# Patient Record
Sex: Female | Born: 1985 | Race: Black or African American | Hispanic: No | Marital: Single | Smoking: Current some day smoker
Health system: Southern US, Community
[De-identification: ages and names within clinical notes are randomized; demographics above are authoritative.]

## PROBLEM LIST (undated history)

## (undated) DIAGNOSIS — J45909 Unspecified asthma, uncomplicated: Secondary | ICD-10-CM

## (undated) DIAGNOSIS — C8190 Hodgkin lymphoma, unspecified, unspecified site: Secondary | ICD-10-CM

## (undated) DIAGNOSIS — J80 Acute respiratory distress syndrome: Secondary | ICD-10-CM

## (undated) DIAGNOSIS — I10 Essential (primary) hypertension: Secondary | ICD-10-CM

## (undated) NOTE — *Deleted (*Deleted)
HEMATOLOGY ONCOLOGY PROGRESS NOTE  Date of service:   10/06/20     Patient Care Team: Patient, No Pcp Per as PCP - General (General Practice)  Chief complaint: Follow-up for Hodgkin's lymphoma  Diagnosis:   Refractory Mixed cellularity Hodgkin's lymphoma IVBE with extensive lymphadenopathy including right axillary, mediastinal and upper retroperitoneal and now biopsy proven pulmonary involvement. She was noted to have significant constitutional symptoms including significant weight loss, fevers chills and some night sweats.  Current Treatment:  Pembrolizumab- 4th line. Patient declined HDT/Auto HSCT or consideration of Car-T cell therapy.  Previous treatment  5 cycles of AVD (without bleomycin due to lung involvement and DLCO of 37%, active smoker) Multiple avoidable treatment delays due to the patient's noncompliance with follow-up for avoidable reasons. She has been counseled repeatedly that this would increase the likelihood of unfavorable outcome.  2nd line therapy with Bendamustine + Brentuximab s/p 7 cycles.  3rd line therapy with ICE s/p 2 cycles   INTERVAL HISTORY: Ms Latchford is here for follow-up for her Hodgkins lymphoma for C24D1***Pembrolizumab treatment. The patient's last visit with [place] was on 06/27/2020. The pt reports that she is doing well overall.  The pt reports ***  Of note since the patient's last visit, pt has had *** completed on *** with results revealing ***.  Lab results today (10/06/20) of CBC w/diff and CMP is as follows: all values are WNL except for ***. 10/06/2020 Sed Rate at ***  On review of systems, pt reports *** and denies ***and any other symptoms.   A&P: -Discussed pt labwork today, 10/06/20; *** -***  REVIEW OF SYSTEMS:  A 10+ POINT REVIEW OF SYSTEMS WAS OBTAINED including neurology, dermatology, psychiatry, cardiac, respiratory, lymph, extremities, GI, GU, Musculoskeletal, constitutional, breasts, reproductive, HEENT.  All  pertinent positives are noted in the HPI.  All others are negative.   Past Medical History:  Diagnosis Date  . ARDS (adult respiratory distress syndrome) (HCC)   . Asthma   . Hodgkin lymphoma (HCC)   . Hypertension     . Past Surgical History:  Procedure Laterality Date  . AXILLARY LYMPH NODE BIOPSY Right 03/19/2016   Procedure: AXILLARY LYMPH NODE BIOPSY;  Surgeon: Darnell Level, MD;  Location: [facility];  Service: General;  Laterality: Right;  . IR GENERIC HISTORICAL  12/22/2016   IR FLUORO GUIDE PORT INSERTION LEFT 12/22/2016 [facility]  . IR GENERIC HISTORICAL  12/22/2016   IR US GUIDE VASC ACCESS LEFT 12/22/2016 [facility]  . IR GENERIC HISTORICAL  12/22/2016   IR CV LINE INJECTION 12/22/2016 [facility]  . IR GENERIC HISTORICAL  12/22/2016   IR REMOVAL TUN ACCESS W/ PORT W/O FL MOD SED 12/22/2016 [facility]  . VIDEO BRONCHOSCOPY Bilateral 11/26/2016   Procedure: VIDEO BRONCHOSCOPY WITH FLUORO;  Surgeon: Oretha Milch, MD;  Location: [facility];  Service: Cardiopulmonary;  Laterality: Bilateral;    . Social History   Tobacco Use  . Smoking status: Former Smoker    Packs/day: 0.50    Years: 15.00    Pack years: 7.50    Types: Cigarettes    Quit date: 03/27/2016    Years since quitting: 4.5  . Smokeless tobacco: Never Used  Vaping Use  . Vaping Use: Never used  Substance Use Topics  . Alcohol use: Yes    Comment: occasional now  . Drug use: Not on file    ALLERGIES:  has No Known Allergies.  MEDICATIONS:  Current Outpatient Medications  Medication Sig Dispense Refill  .  acyclovir (ZOVIRAX) 400 MG tablet Take 1 tablet (400 mg total) by mouth 2 (two) times daily. Start after completion of Valtrex (Patient not taking: Reported on 01/11/2020) 60 tablet 6  . amitriptyline (ELAVIL) 10 MG tablet Take 1 tablet (10 mg total) by mouth at bedtime. (Patient not taking: Reported on 01/11/2020) 20 tablet 0  . amoxicillin-clavulanate (AUGMENTIN) 875-125 MG tablet Take 1  tablet by mouth every 12 (twelve) hours. (Patient not taking: Reported on 01/11/2020) 14 tablet 0  . gabapentin (NEURONTIN) 300 MG capsule Take 1 capsule (300 mg total) by mouth 2 (two) times daily. (Patient not taking: Reported on 01/11/2020) 60 capsule 0  . oxyCODONE-acetaminophen (PERCOCET/ROXICET) 5-325 MG tablet Take 1-2 tablets by mouth every 6 (six) hours as needed for severe pain. (Patient not taking: Reported on 01/11/2020) 50 tablet 0   No current facility-administered medications for this visit.   Facility-Administered Medications Ordered in Other Visits  Medication Dose Route Frequency Provider Last Rate Last Admin  . sodium chloride flush (NS) 0.9 % injection 10 mL  10 mL Intracatheter PRN [name], MD        PHYSICAL EXAMINATION: ECOG FS:1 - Symptomatic but completely ambulatory  There were no vitals filed for this visit. Wt Readings from Last 3 Encounters:  07/21/20 144 lb (65.3 kg)  06/27/20 144 lb 9.6 oz (65.6 kg)  04/25/20 150 lb 3.2 oz (68.1 kg)   There is no height or weight on file to calculate BMI.    *** GENERAL:alert, in no acute distress and comfortable SKIN: no acute rashes, no significant lesions EYES: conjunctiva are pink and non-injected, sclera anicteric OROPHARYNX: MMM, no exudates, no oropharyngeal erythema or ulceration NECK: supple, no JVD LYMPH:  no palpable lymphadenopathy in the cervical, axillary or inguinal regions LUNGS: clear to auscultation b/l with normal respiratory effort HEART: regular rate & rhythm ABDOMEN:  normoactive bowel sounds , non tender, not distended. No palpable hepatosplenomegaly.  Extremity: no pedal edema PSYCH: alert & oriented x 3 with fluent speech NEURO: no focal motor/sensory deficits  LABORATORY DATA:   I have reviewed the data as listed  . CBC Latest Ref Rng & Units 08/22/2020 07/21/2020 06/27/2020  WBC 4.0 - 10.5 K/uL 3.7(L) 2.6(L) 3.4(L)  Hemoglobin 12.0 - 15.0 g/dL 16.1 11.4(L) 11.9(L)  Hematocrit  36 - 46 % 35.9(L) 33.9(L) 35.0(L)  Platelets 150 - 400 K/uL 184 145(L) 177   . CBC    Component Value Date/Time   WBC 3.7 (L) 08/22/2020 0825   RBC 3.62 (L) 08/22/2020 0825   HGB 12.1 08/22/2020 0825   HGB 10.6 (L) 11/07/2018 1047   HGB 11.7 07/20/2017 1315   HCT 35.9 (L) 08/22/2020 0825   HCT 35.2 07/20/2017 1315   PLT 184 08/22/2020 0825   PLT 473 (H) 11/07/2018 1047   PLT 266 07/20/2017 1315   MCV 99.2 08/22/2020 0825   MCV 93.2 07/20/2017 1315   MCH 33.4 08/22/2020 0825   MCHC 33.7 08/22/2020 0825   RDW 12.1 08/22/2020 0825   RDW 14.5 07/20/2017 1315   LYMPHSABS 0.5 (L) 08/22/2020 0825   LYMPHSABS 0.3 (L) 07/20/2017 1315   MONOABS 0.7 08/22/2020 0825   MONOABS 0.5 07/20/2017 1315   EOSABS 0.1 08/22/2020 0825   EOSABS 0.2 07/20/2017 1315   BASOSABS 0.0 08/22/2020 0825   BASOSABS 0.0 07/20/2017 1315   . CMP Latest Ref Rng & Units 08/22/2020 07/21/2020 06/27/2020  Glucose 70 - 99 mg/dL 09(U) 83 045(W)  BUN 6 - 20 mg/dL 10  8 8  Creatinine 0.44 - 1.00 mg/dL 7.82 9.56 2.13  Sodium 135 - 145 mmol/L 137 140 138  Potassium 3.5 - 5.1 mmol/L 4.2 3.8 3.8  Chloride 98 - 111 mmol/L 101 101 100  CO2 22 - 32 mmol/L 27 26 24   Calcium 8.9 - 10.3 mg/dL 08.6 9.6 9.2  Total Protein 6.5 - 8.1 g/dL 7.3 7.0 7.4  Total Bilirubin 0.3 - 1.2 mg/dL 0.8 0.5 0.6  Alkaline Phos 38 - 126 U/L 56 63 70  AST 15 - 41 U/L 32 99(H) 72(H)  ALT 0 - 44 U/L 26 83(H) 57(H)     RADIOGRAPHIC STUDIES: I have personally reviewed the radiological images as listed and agreed with the findings in the report. No results found.  ASSESSMENT & PLAN:   33 y.o. 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 She was noted to have significant constitutional symptoms including significant weight loss, fevers chills and some night  sweats. HIV negative Hepatitis C and hepatitis B serologies negative. Echo with normal ejection fraction. Patient was treated with 5 cycles of AVD (Bleomycin held due to poor DLCO 37% and ongoing smoking). Multiple avoidable treatment delays due to the patient's noncompliance with follow-up for avoidable reasons. She has been counseled repeatedly that this would increase the likelihood of unfavorable outcome.  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 S/p 6 cycles of 2nd line treatment with Bendamustine/Brentuximab And 1 cycle of Bretuximab alone  -PET/CT scan results from 04/14/2017 were discussed in details. She appears to have some persistent disease in her right lung at Deauville 5. It is difficult to say if this is recurrent or persistent disease since the patient failed to follow-up on multiple scheduled PET/CT scans in the early part and prior to her second line treatment.  Patient was lost to followup for >1 yr  09/26/18 PET/CT revealed Imaging findings compatible with recurrence of disease. 2. Multiple new large areas of hypermetabolic nodularity and airspace consolidation within both lungs which is presumed to represent pulmonary involvement by lymphoma. Deauville criteria 5. 3. New hypermetabolic left supraclavicular, left retroperitoneal, and bilateral pelvic lymph nodes. Deauville criteria 5. 4. Multifocal hypermetabolic osseous lesions. Deauville criteria 5. 5. Small volume of ascites, new.   12/06/18 PET/CT revealed Generally improved appearance with previously mostly Deauville 5 activity now mostly Deauville 4 activity. 2. Layering of much of the airspace opacity in the lungs, although considerable right perihilar airspace opacity remains. The remaining pulmonary opacities are assess this Deauville 5 on the right and over L4 on the left, and were previously Deauville 5 bilaterally. 3. Mildly reduced size and moderately reduced activity in the  abdominopelvic lymph nodes which are now dove L4. 4. The previously seen left supraclavicular lymph node seems to have completely resolved (Deauville 0). 5. The skeletal lesions remain at Deauville 4, although in absolute terms have decreased in SUV compared to previous. 6. No new regions of malignant involvement compared to prior exam. 7. Other imaging findings of potential clinical significance: Low-density blood pool suggests anemia. Pectus excavatum. Volume loss in the right hemithorax.  06/13/19 PET/CT revealed "No abnormal hypermetabolism (Deauville 1). 2. Mixed lytic and sclerotic osseous lesions appear more prominent than on 12/06/2018 but do not have associated hypermetabolism, indicative of interval healing." 01/31/2020 CT ABDOMEN PELVIS W CONTRAST (Accession [id number]) and CT CHEST W CONTRAST (Accession [id number]) revealed "1. Small  pre pericardial lymph node, not present on the prior study. 2. Stable volume loss in the right chest with shift of mediastinal structures into the right chest. 3. No change in the appearance of multifocal bony sclerosis, without signs of FDG uptake on the previous exam, attention on follow-up."  #2 s/p hypoxic respiratory failurewith dense right lung consolidation and left upper lobe consolidation with SVC syndrome. Patient has completed palliative radiation to the right lung mass causing SVC compression.  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  #4 Non compliancewith clinic and treatment followup. Missed 2nd dose of bendamustine with C2. Has missed multiple appointment for her PET/CT and missed appointment at [facility] for consideration of Transplant.  Pt was lost to follow up after July 2018 and returned on 08/31/18 Discussed the patient's goals of care and the pt noted that she is  ready to begin treatment again and maintain compliance and follow ups   #5 h/o Grade 1 neuropathyfrom Brentuximab-Vedotin - resolved  #6 Shingles outbreak - curentlyresolved but with significant post herpetic neuralgia  #7 Significant anemia and thrombocytopenia after C1 of ICE -- will need close monitoring.  #8 Abnormal LFTs ? Related to Pembrolizumab - stable today  PLAN: *** -Will continue to get scans every 4-6 months unless new symptoms arise  -No overt lab, clinical or radiographic evidence of CHL progression at this time.*** -Advised on absolute alcohol cessation    FOLLOW UP: ***   The total time spent in the appt was *** minutes and more than 50% was on counseling and direct patient cares.  All of the patient's questions were answered with apparent satisfaction. The patient knows to call the clinic with any problems, questions or concerns.   Wyvonnia Lora MD MS AAHIVMS [facility] Hematology/Oncology Physician [facility]  (Office):       [phone] (Work cell):  [phone] (Fax):           [phone]  I, Carollee Herter, am acting as a scribe for Dr. Wyvonnia Lora.   {Add Production assistant, radio Statement}

## (undated) NOTE — *Deleted (*Deleted)
HEMATOLOGY ONCOLOGY PROGRESS NOTE  Date of service:   10/31/20     Patient Care Team: Patient, No Pcp Per as PCP - General (General Practice)  Chief complaint: Follow-up for Hodgkin's lymphoma  Diagnosis:   Refractory Mixed cellularity Hodgkin's lymphoma IVBE with extensive lymphadenopathy including right axillary, mediastinal and upper retroperitoneal and now biopsy proven pulmonary involvement. She was noted to have significant constitutional symptoms including significant weight loss, fevers chills and some night sweats.  Current Treatment:  Pembrolizumab- 4th line. Patient declined HDT/Auto HSCT or consideration of Car-T cell therapy.  Previous treatment  5 cycles of AVD (without bleomycin due to lung involvement and DLCO of 37%, active smoker) Multiple avoidable treatment delays due to the patient's noncompliance with follow-up for avoidable reasons. She has been counseled repeatedly that this would increase the likelihood of unfavorable outcome.  2nd line therapy with Bendamustine + Brentuximab s/p 7 cycles.  3rd line therapy with ICE s/p 2 cycles   INTERVAL HISTORY: [name] is here for follow-up for her Hodgkins lymphoma for C24D1 Pembrolizumab treatment. The patient's last visit with [place] was on 06/27/2020. The pt reports that she is doing well overall.  The pt reports ***  Of note since the patient's last visit, pt has had *** completed on *** with results revealing ***.  Lab results today (10/31/20) of CBC w/diff and CMP is as follows: all values are WNL except for ***. 10/31/2020 Sed Rate at ***  On review of systems, pt reports *** and denies ***and any other symptoms.   A&P: -Discussed pt labwork today, 10/31/20; *** -***  REVIEW OF SYSTEMS:  A 10+ POINT REVIEW OF SYSTEMS WAS OBTAINED including neurology, dermatology, psychiatry, cardiac, respiratory, lymph, extremities, GI, GU, Musculoskeletal, constitutional, breasts, reproductive, HEENT.  All  pertinent positives are noted in the HPI.  All others are negative.   Past Medical History:  Diagnosis Date  . ARDS (adult respiratory distress syndrome) (HCC)   . Asthma   . Hodgkin lymphoma (HCC)   . Hypertension     . Past Surgical History:  Procedure Laterality Date  . AXILLARY LYMPH NODE BIOPSY Right 03/19/2016   Procedure: AXILLARY LYMPH NODE BIOPSY;  Surgeon: Darnell Level, MD;  Location: [facility];  Service: General;  Laterality: Right;  . IR GENERIC HISTORICAL  12/22/2016   IR FLUORO GUIDE PORT INSERTION LEFT 12/22/2016 [facility]  . IR GENERIC HISTORICAL  12/22/2016   IR US GUIDE VASC ACCESS LEFT 12/22/2016 [facility]  . IR GENERIC HISTORICAL  12/22/2016   IR CV LINE INJECTION 12/22/2016 [facility]  . IR GENERIC HISTORICAL  12/22/2016   IR REMOVAL TUN ACCESS W/ PORT W/O FL MOD SED 12/22/2016 [facility]  . VIDEO BRONCHOSCOPY Bilateral 11/26/2016   Procedure: VIDEO BRONCHOSCOPY WITH FLUORO;  Surgeon: Oretha Milch, MD;  Location: [facility];  Service: Cardiopulmonary;  Laterality: Bilateral;    . Social History   Tobacco Use  . Smoking status: Former Smoker    Packs/day: 0.50    Years: 15.00    Pack years: 7.50    Types: Cigarettes    Quit date: 03/27/2016    Years since quitting: 4.6  . Smokeless tobacco: Never Used  Vaping Use  . Vaping Use: Never used  Substance Use Topics  . Alcohol use: Yes    Comment: occasional now  . Drug use: Not on file    ALLERGIES:  has No Known Allergies.  MEDICATIONS:  Current Outpatient Medications  Medication Sig Dispense Refill  .  acyclovir (ZOVIRAX) 400 MG tablet Take 1 tablet (400 mg total) by mouth 2 (two) times daily. Start after completion of Valtrex (Patient not taking: Reported on 01/11/2020) 60 tablet 6  . amitriptyline (ELAVIL) 10 MG tablet Take 1 tablet (10 mg total) by mouth at bedtime. (Patient not taking: Reported on 01/11/2020) 20 tablet 0  . amoxicillin-clavulanate (AUGMENTIN) 875-125 MG tablet Take 1  tablet by mouth every 12 (twelve) hours. (Patient not taking: Reported on 01/11/2020) 14 tablet 0  . gabapentin (NEURONTIN) 300 MG capsule Take 1 capsule (300 mg total) by mouth 2 (two) times daily. (Patient not taking: Reported on 01/11/2020) 60 capsule 0  . oxyCODONE-acetaminophen (PERCOCET/ROXICET) 5-325 MG tablet Take 1-2 tablets by mouth every 6 (six) hours as needed for severe pain. (Patient not taking: Reported on 01/11/2020) 50 tablet 0   No current facility-administered medications for this visit.   Facility-Administered Medications Ordered in Other Visits  Medication Dose Route Frequency Provider Last Rate Last Admin  . sodium chloride flush (NS) 0.9 % injection 10 mL  10 mL Intracatheter PRN [name], MD        PHYSICAL EXAMINATION: ECOG FS:1 - Symptomatic but completely ambulatory  There were no vitals filed for this visit. Wt Readings from Last 3 Encounters:  07/21/20 144 lb (65.3 kg)  06/27/20 144 lb 9.6 oz (65.6 kg)  04/25/20 150 lb 3.2 oz (68.1 kg)   There is no height or weight on file to calculate BMI.    *** GENERAL:alert, in no acute distress and comfortable SKIN: no acute rashes, no significant lesions EYES: conjunctiva are pink and non-injected, sclera anicteric OROPHARYNX: MMM, no exudates, no oropharyngeal erythema or ulceration NECK: supple, no JVD LYMPH:  no palpable lymphadenopathy in the cervical, axillary or inguinal regions LUNGS: clear to auscultation b/l with normal respiratory effort HEART: regular rate & rhythm ABDOMEN:  normoactive bowel sounds , non tender, not distended. No palpable hepatosplenomegaly.  Extremity: no pedal edema PSYCH: alert & oriented x 3 with fluent speech NEURO: no focal motor/sensory deficits  LABORATORY DATA:   I have reviewed the data as listed  . CBC Latest Ref Rng & Units 08/22/2020 07/21/2020 06/27/2020  WBC 4.0 - 10.5 K/uL 3.7(L) 2.6(L) 3.4(L)  Hemoglobin 12.0 - 15.0 g/dL 16.1 11.4(L) 11.9(L)  Hematocrit  36 - 46 % 35.9(L) 33.9(L) 35.0(L)  Platelets 150 - 400 K/uL 184 145(L) 177   . CBC    Component Value Date/Time   WBC 3.7 (L) 08/22/2020 0825   RBC 3.62 (L) 08/22/2020 0825   HGB 12.1 08/22/2020 0825   HGB 10.6 (L) 11/07/2018 1047   HGB 11.7 07/20/2017 1315   HCT 35.9 (L) 08/22/2020 0825   HCT 35.2 07/20/2017 1315   PLT 184 08/22/2020 0825   PLT 473 (H) 11/07/2018 1047   PLT 266 07/20/2017 1315   MCV 99.2 08/22/2020 0825   MCV 93.2 07/20/2017 1315   MCH 33.4 08/22/2020 0825   MCHC 33.7 08/22/2020 0825   RDW 12.1 08/22/2020 0825   RDW 14.5 07/20/2017 1315   LYMPHSABS 0.5 (L) 08/22/2020 0825   LYMPHSABS 0.3 (L) 07/20/2017 1315   MONOABS 0.7 08/22/2020 0825   MONOABS 0.5 07/20/2017 1315   EOSABS 0.1 08/22/2020 0825   EOSABS 0.2 07/20/2017 1315   BASOSABS 0.0 08/22/2020 0825   BASOSABS 0.0 07/20/2017 1315   . CMP Latest Ref Rng & Units 08/22/2020 07/21/2020 06/27/2020  Glucose 70 - 99 mg/dL 09(U) 83 045(W)  BUN 6 - 20 mg/dL 10  8 8  Creatinine 0.44 - 1.00 mg/dL 1.61 0.96 0.45  Sodium 135 - 145 mmol/L 137 140 138  Potassium 3.5 - 5.1 mmol/L 4.2 3.8 3.8  Chloride 98 - 111 mmol/L 101 101 100  CO2 22 - 32 mmol/L 27 26 24   Calcium 8.9 - 10.3 mg/dL 40.9 9.6 9.2  Total Protein 6.5 - 8.1 g/dL 7.3 7.0 7.4  Total Bilirubin 0.3 - 1.2 mg/dL 0.8 0.5 0.6  Alkaline Phos 38 - 126 U/L 56 63 70  AST 15 - 41 U/L 32 99(H) 72(H)  ALT 0 - 44 U/L 26 83(H) 57(H)     RADIOGRAPHIC STUDIES: I have personally reviewed the radiological images as listed and agreed with the findings in the report. No results found.  ASSESSMENT & PLAN:   69 y.o. 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 She was noted to have significant constitutional symptoms including significant weight loss, fevers chills and some night  sweats. HIV negative Hepatitis C and hepatitis B serologies negative. Echo with normal ejection fraction. Patient was treated with 5 cycles of AVD (Bleomycin held due to poor DLCO 37% and ongoing smoking). Multiple avoidable treatment delays due to the patient's noncompliance with follow-up for avoidable reasons. She has been counseled repeatedly that this would increase the likelihood of unfavorable outcome.  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 S/p 6 cycles of 2nd line treatment with Bendamustine/Brentuximab And 1 cycle of Bretuximab alone  -PET/CT scan results from 04/14/2017 were discussed in details. She appears to have some persistent disease in her right lung at Deauville 5. It is difficult to say if this is recurrent or persistent disease since the patient failed to follow-up on multiple scheduled PET/CT scans in the early part and prior to her second line treatment.  Patient was lost to followup for >1 yr  09/26/18 PET/CT revealed Imaging findings compatible with recurrence of disease. 2. Multiple new large areas of hypermetabolic nodularity and airspace consolidation within both lungs which is presumed to represent pulmonary involvement by lymphoma. Deauville criteria 5. 3. New hypermetabolic left supraclavicular, left retroperitoneal, and bilateral pelvic lymph nodes. Deauville criteria 5. 4. Multifocal hypermetabolic osseous lesions. Deauville criteria 5. 5. Small volume of ascites, new.   12/06/18 PET/CT revealed Generally improved appearance with previously mostly Deauville 5 activity now mostly Deauville 4 activity. 2. Layering of much of the airspace opacity in the lungs, although considerable right perihilar airspace opacity remains. The remaining pulmonary opacities are assess this Deauville 5 on the right and over L4 on the left, and were previously Deauville 5 bilaterally. 3. Mildly reduced size and moderately reduced activity in the  abdominopelvic lymph nodes which are now dove L4. 4. The previously seen left supraclavicular lymph node seems to have completely resolved (Deauville 0). 5. The skeletal lesions remain at Deauville 4, although in absolute terms have decreased in SUV compared to previous. 6. No new regions of malignant involvement compared to prior exam. 7. Other imaging findings of potential clinical significance: Low-density blood pool suggests anemia. Pectus excavatum. Volume loss in the right hemithorax.  06/13/19 PET/CT revealed "No abnormal hypermetabolism (Deauville 1). 2. Mixed lytic and sclerotic osseous lesions appear more prominent than on 12/06/2018 but do not have associated hypermetabolism, indicative of interval healing." 01/31/2020 CT ABDOMEN PELVIS W CONTRAST (Accession [id number]) and CT CHEST W CONTRAST (Accession [id number]) revealed "1. Small  pre pericardial lymph node, not present on the prior study. 2. Stable volume loss in the right chest with shift of mediastinal structures into the right chest. 3. No change in the appearance of multifocal bony sclerosis, without signs of FDG uptake on the previous exam, attention on follow-up."  #2 s/p hypoxic respiratory failurewith dense right lung consolidation and left upper lobe consolidation with SVC syndrome. Patient has completed palliative radiation to the right lung mass causing SVC compression.  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  #4 Non compliancewith clinic and treatment followup. Missed 2nd dose of bendamustine with C2. Has missed multiple appointment for her PET/CT and missed appointment at [facility] for consideration of Transplant.  Pt was lost to follow up after July 2018 and returned on 08/31/18 Discussed the patient's goals of care and the pt noted that she is  ready to begin treatment again and maintain compliance and follow ups   #5 h/o Grade 1 neuropathyfrom Brentuximab-Vedotin - resolved  #6 Shingles outbreak - curentlyresolved but with significant post herpetic neuralgia  #7 Significant anemia and thrombocytopenia after C1 of ICE -- will need close monitoring.  #8 Abnormal LFTs ? Related to Pembrolizumab - stable today  PLAN: *** -No overt lab, clinical or radiographic evidence of CHL progression at this time.*** -Advised on absolute alcohol cessation   FOLLOW UP: ***   The total time spent in the appt was *** minutes and more than 50% was on counseling and direct patient cares.  All of the patient's questions were answered with apparent satisfaction. The patient knows to call the clinic with any problems, questions or concerns.   Wyvonnia Lora MD MS AAHIVMS [facility] Hematology/Oncology Physician [facility]  (Office):       [phone] (Work cell):  [phone] (Fax):           [phone]  I, Carollee Herter, am acting as a scribe for Dr. Wyvonnia Lora.   {Add Production assistant, radio Statement}

---
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
## 2004-02-06 ENCOUNTER — Emergency Department (HOSPITAL_COMMUNITY): Admission: EM | Admit: 2004-02-06 | Discharge: 2004-02-06 | Payer: Self-pay | Admitting: Emergency Medicine

## 2004-04-30 ENCOUNTER — Ambulatory Visit (HOSPITAL_COMMUNITY): Admission: RE | Admit: 2004-04-30 | Discharge: 2004-04-30 | Payer: Self-pay | Admitting: *Deleted

## 2004-05-14 ENCOUNTER — Encounter: Admission: RE | Admit: 2004-05-14 | Discharge: 2004-05-14 | Payer: Self-pay | Admitting: Family Medicine

## 2004-06-04 ENCOUNTER — Encounter: Admission: RE | Admit: 2004-06-04 | Discharge: 2004-06-04 | Payer: Self-pay | Admitting: Family Medicine

## 2004-06-18 ENCOUNTER — Encounter: Admission: RE | Admit: 2004-06-18 | Discharge: 2004-06-18 | Payer: Self-pay | Admitting: *Deleted

## 2004-07-09 ENCOUNTER — Encounter: Admission: RE | Admit: 2004-07-09 | Discharge: 2004-07-09 | Payer: Self-pay | Admitting: Family Medicine

## 2004-07-23 ENCOUNTER — Encounter: Admission: RE | Admit: 2004-07-23 | Discharge: 2004-07-23 | Payer: Self-pay | Admitting: Family Medicine

## 2004-07-24 ENCOUNTER — Encounter: Admission: RE | Admit: 2004-07-24 | Discharge: 2004-07-24 | Payer: Self-pay | Admitting: Family Medicine

## 2004-07-28 ENCOUNTER — Ambulatory Visit (HOSPITAL_COMMUNITY): Admission: RE | Admit: 2004-07-28 | Discharge: 2004-07-28 | Payer: Self-pay | Admitting: *Deleted

## 2004-07-30 ENCOUNTER — Encounter: Admission: RE | Admit: 2004-07-30 | Discharge: 2004-07-30 | Payer: Self-pay | Admitting: Family Medicine

## 2004-08-14 ENCOUNTER — Encounter: Admission: RE | Admit: 2004-08-14 | Discharge: 2004-08-14 | Payer: Self-pay | Admitting: Family Medicine

## 2004-08-28 ENCOUNTER — Ambulatory Visit: Payer: Self-pay | Admitting: Family Medicine

## 2004-09-03 ENCOUNTER — Ambulatory Visit: Payer: Self-pay | Admitting: Family Medicine

## 2004-09-10 ENCOUNTER — Ambulatory Visit: Payer: Self-pay | Admitting: Family Medicine

## 2004-09-10 ENCOUNTER — Ambulatory Visit (HOSPITAL_COMMUNITY): Admission: RE | Admit: 2004-09-10 | Discharge: 2004-09-10 | Payer: Self-pay | Admitting: *Deleted

## 2004-09-17 ENCOUNTER — Ambulatory Visit: Payer: Self-pay | Admitting: Family Medicine

## 2004-09-24 ENCOUNTER — Ambulatory Visit: Payer: Self-pay | Admitting: Family Medicine

## 2004-10-01 ENCOUNTER — Ambulatory Visit: Payer: Self-pay | Admitting: Family Medicine

## 2004-10-08 ENCOUNTER — Ambulatory Visit: Payer: Self-pay | Admitting: Family Medicine

## 2004-10-11 ENCOUNTER — Ambulatory Visit: Payer: Self-pay | Admitting: Obstetrics & Gynecology

## 2004-10-11 ENCOUNTER — Inpatient Hospital Stay (HOSPITAL_COMMUNITY): Admission: RE | Admit: 2004-10-11 | Discharge: 2004-10-14 | Payer: Self-pay | Admitting: Family Medicine

## 2006-07-11 ENCOUNTER — Emergency Department (HOSPITAL_COMMUNITY): Admission: EM | Admit: 2006-07-11 | Discharge: 2006-07-11 | Payer: Self-pay | Admitting: Emergency Medicine

## 2007-04-02 ENCOUNTER — Emergency Department (HOSPITAL_COMMUNITY): Admission: EM | Admit: 2007-04-02 | Discharge: 2007-04-02 | Payer: Self-pay | Admitting: Emergency Medicine

## 2007-05-30 ENCOUNTER — Inpatient Hospital Stay (HOSPITAL_COMMUNITY): Admission: AD | Admit: 2007-05-30 | Discharge: 2007-05-30 | Payer: Self-pay | Admitting: Obstetrics & Gynecology

## 2007-07-05 ENCOUNTER — Ambulatory Visit (HOSPITAL_COMMUNITY): Admission: RE | Admit: 2007-07-05 | Discharge: 2007-07-05 | Payer: Self-pay | Admitting: Family Medicine

## 2007-07-20 ENCOUNTER — Ambulatory Visit: Payer: Self-pay | Admitting: Family Medicine

## 2007-07-27 ENCOUNTER — Emergency Department (HOSPITAL_COMMUNITY): Admission: EM | Admit: 2007-07-27 | Discharge: 2007-07-27 | Payer: Self-pay | Admitting: Emergency Medicine

## 2007-08-03 ENCOUNTER — Ambulatory Visit: Payer: Self-pay | Admitting: Family Medicine

## 2007-08-07 ENCOUNTER — Ambulatory Visit (HOSPITAL_COMMUNITY): Admission: RE | Admit: 2007-08-07 | Discharge: 2007-08-07 | Payer: Self-pay | Admitting: Family Medicine

## 2007-08-24 ENCOUNTER — Ambulatory Visit: Payer: Self-pay | Admitting: *Deleted

## 2007-09-07 ENCOUNTER — Ambulatory Visit: Payer: Self-pay | Admitting: Obstetrics & Gynecology

## 2007-09-21 ENCOUNTER — Ambulatory Visit: Payer: Self-pay | Admitting: Family Medicine

## 2007-10-05 ENCOUNTER — Ambulatory Visit: Payer: Self-pay | Admitting: *Deleted

## 2007-10-19 ENCOUNTER — Ambulatory Visit: Payer: Self-pay | Admitting: Obstetrics & Gynecology

## 2007-11-02 ENCOUNTER — Ambulatory Visit: Payer: Self-pay | Admitting: Obstetrics & Gynecology

## 2007-11-09 ENCOUNTER — Ambulatory Visit: Payer: Self-pay | Admitting: *Deleted

## 2007-11-16 ENCOUNTER — Ambulatory Visit: Payer: Self-pay | Admitting: Obstetrics & Gynecology

## 2007-11-20 ENCOUNTER — Ambulatory Visit: Payer: Self-pay | Admitting: Obstetrics & Gynecology

## 2007-11-27 ENCOUNTER — Ambulatory Visit: Payer: Self-pay | Admitting: Obstetrics and Gynecology

## 2007-11-29 ENCOUNTER — Inpatient Hospital Stay (HOSPITAL_COMMUNITY): Admission: AD | Admit: 2007-11-29 | Discharge: 2007-12-01 | Payer: Self-pay | Admitting: Obstetrics & Gynecology

## 2007-11-29 ENCOUNTER — Ambulatory Visit: Payer: Self-pay | Admitting: Obstetrics and Gynecology

## 2009-08-01 ENCOUNTER — Emergency Department (HOSPITAL_COMMUNITY): Admission: EM | Admit: 2009-08-01 | Discharge: 2009-08-01 | Payer: Self-pay | Admitting: Emergency Medicine

## 2010-01-12 ENCOUNTER — Emergency Department (HOSPITAL_COMMUNITY): Admission: EM | Admit: 2010-01-12 | Discharge: 2010-01-12 | Payer: Self-pay | Admitting: Emergency Medicine

## 2010-01-14 ENCOUNTER — Emergency Department (HOSPITAL_COMMUNITY): Admission: EM | Admit: 2010-01-14 | Discharge: 2010-01-14 | Payer: Self-pay | Admitting: Emergency Medicine

## 2011-01-17 ENCOUNTER — Encounter: Payer: Self-pay | Admitting: *Deleted

## 2011-04-03 LAB — URINALYSIS, ROUTINE W REFLEX MICROSCOPIC
Glucose, UA: NEGATIVE mg/dL
Hgb urine dipstick: NEGATIVE
Protein, ur: NEGATIVE mg/dL
Specific Gravity, Urine: 1.029 (ref 1.005–1.030)

## 2011-04-03 LAB — URINE MICROSCOPIC-ADD ON

## 2011-04-03 LAB — WET PREP, GENITAL

## 2011-05-11 NOTE — Op Note (Signed)
NAME:  Heidi [name], Heidi [name]            ACCOUNT NO.:  [id number]   MEDICAL RECORD NO.:  [id number]          PATIENT TYPE:  INP   LOCATION:                                FACILITY:  [facility]   PHYSICIAN:  Phil D. Okey Dupre, M.D.     DATE OF BIRTH:  1986-01-19   DATE OF PROCEDURE:  11/30/2007  DATE OF DISCHARGE:  12/01/2007                               OPERATIVE REPORT   PREOPERATIVE DIAGNOSES:  1. Postpartum day #1, spontaneous vaginal delivery.  2. Desires sterilization.   POSTOPERATIVE DIAGNOSES:  1. Postpartum day #1, spontaneous vaginal delivery.  2. Desires sterilization.   OPERATION PERFORMED:  Postpartum bilateral tubal ligation with Filshie  clips.   SURGEON:  Javier Glazier. Okey Dupre, M.D.   ASSISTANT:  Karlton Lemon, MD   ANESTHESIA:  General.   SPECIMENS:  None.   ESTIMATED BLOOD LOSS:  Less than 10 mL.   COMPLICATIONS:  None immediate.   INDICATIONS FOR PROCEDURE:  This is a 25 year old female postpartum day  #1 who desires sterilization.  Her 30 day consent for postpartum tubal  ligation has been signed previously.   DESCRIPTION OF PROCEDURE:  The patient was taken to the operating room  and after obtaining general anesthesia the patient was prepped and  draped in the usual sterile manner.  General anesthesia was adequate.  Allis clamps were placed on the skin below the umbilicus to tent to the  skin and a skin incision was made using the scalpel.  Bovie cautery was  used to obtain good hemostasis of the subcutaneous tissues.  The  incision was carried down through the fascia with Mayo scissors.  The  peritoneum was entered using Mayo scissors as well.  Small Deaver  retractors were placed to help with visualization of the peritoneal  cavity.  The patient was placed in Trendelenburg position and the right  fallopian tube was identified.  It was clamped with a Babcock and  followed out to the fimbria which was identified.  A Filshie clip was  then placed between two Babcocks under  direct visualization.  The tube  was then allowed to fall back within the peritoneal cavity after being  released from the Babcocks.  Attention was then turned to the left tube  which was identified and clamped with a Babcock clamp.  The tube was  followed out once again to the fimbria.  Then a Filshie clip was placed  between two Babcocks under direct visualization.  The tube was then  allowed to be released from the Babcock clamps and to fall back into the  peritoneal cavity.  The peritoneum was grasped with small Kelly's x4.  The fascia and peritoneum was closed in one layer with  a running locked stitch of 0 Vicryl.  The skin was then closed  subcuticularly with 3-0 Vicryl in a running fashion.  Dermabond was  placed on the skin incision.  The patient tolerated the procedure well  and went to post anesthesia care unit in stable condition.  The sponge,  needle and instrument counts were correct x2.      Karlton Lemon, MD  Electronically Signed     ______________________________  Javier Glazier Okey Dupre, M.D.    NS/MEDQ  D:  11/30/2007  T:  11/30/2007  Job:  [id number]

## 2011-05-14 NOTE — Op Note (Signed)
[facility]  Patient:    Heidi [name], Heidi [name] Visit Number: [id number] MRN: [id number]          Service Type: DSU Location: [facility] Attending Physician:  Tammi Sou Dictated by:   Bing Neighbors Clearance Coots, M.D. Proc. Date: 10/30/01 Admit Date:  10/30/2001                             Operative Report  PREOPERATIVE DIAGNOSIS:       First trimester blighted ovum.  POSTOPERATIVE DIAGNOSIS:      First trimester blight ovum.  PROCEDURE:                    Suction dilation and evacuation.  SURGEON:                      Charles A. Clearance Coots, M.D.  ANESTHESIA:                   General.  ESTIMATED BLOOD LOSS:         200 ml.  COMPLICATIONS:                None.  SPECIMENS:                    Products of conception.  DESCRIPTION OF PROCEDURE:     The patient was brought to the operating room. After satisfactory general anesthesia by mask, because it was decided after evaluation by anesthesia that the patient would not be a good candidate for IV sedation and local anesthesia.  The patients legs were then brought up in stirrups and the vagina was prepped and draped in the usual sterile fashion. The urinary bladder was emptied of approximately 200 ml of clear urine. Bimanual examination revealed the uterus to be midposition and approximately ten weeks size.  A sterile speculum was inserted in the vaginal vault and the cervix was isolated.  The anterior lip of the cervix was grasped with a single-tooth tenaculum.  The axis of the uterus was sounded with a uterine sound and the cervix was dilated to a #25 Pratt dilator.  A #7 suction curet was easily introduced into the uterine cavity and all contents were evacuated. The endometrial surface was curetted with a medium sharp curet.  No further products of conception were obtained.  There was no active bleeding at the conclusion of the procedure and the uterus contracted down quite well.  The endometrial  surface felt gritty with the curetting throughout.  All instruments were then retired.  The patient tolerated the procedure well and was transported to the recovery room in satisfactory condition. Dictated by:   Bing Neighbors Clearance Coots, M.D. Attending Physician:  Tammi Sou DD:  10/30/01 TD:  10/30/01 Job: [id number]

## 2011-05-14 NOTE — Discharge Summary (Signed)
[facility]  Patient:    Heidi [name], Heidi [name] Visit Number: [id number] MRN: [id number]          Service Type: OBS Location: 910A [id number] Attending Physician:  Amada Kingfisher. Admit Date:  06/20/2002 Discharge Date: 07/01/2002                             Discharge Summary  ADMITTING DIAGNOSIS:          Preeclampsia.  POSTOPERATIVE DIAGNOSIS:      Preeclampsia.  HISTORY OF PRESENT ILLNESS:   Ms. Koike is a 25 year old G2, P0-0-1-0, who presented at 28-2/7 weeks to [facility] after being seen at [facility] early in the day, where it was noted that she had an elevated blood pressure and 3+ protein in her urinalysis.  At the time the patient denied any visual changes, headache, nausea, vomiting, cramping, or edema.  Her last menstrual period was December 04, 2001, and her estimated date of confinement was September 10, 2002, which was by both dates and an ultrasound done during the first trimester.  During evaluation in the maternal admission unit, her blood pressure ranged from 149/94 to 161/108.  Her pulse was in the 60s, and she was afebrile.  She was alert but with no edema, 2+ DTRs bilaterally, and a UA showed 3+ protein and rare hyaline casts, 3+ bacteria, large white blood cells, and a CBC showed platelets of 151.  PIH labs showed normal LFTs except for an elevated alkaline phosphatase of 159 and a uric acid level of 6.4 She was admitted and started on magnesium sulfate per protocol and given betamethasone x2.  An OB ultrasound was performed, which showed decreased growth from her previous ultrasound three weeks prior to admission, as well as poor abdominal circumference growth and an elevated systolic to diastolic ratio in the umbilical artery.  On admission fetal heart rates were 125-135 baseline and tocometer showed intermittent uterine irritability.  Cervical exam showed a posterior closed cervix with the fetus vertex at 0  station.  HOSPITAL COURSE:              A 24-hour urinary protein level was obtained on June 26 of 2432 mg and a 24-hour urinary creatinine level was 1592 mg.  The patient was continued on magnesium therapy and PIH labs were followed.  PIH labs remained stable, and the patient remained asymptomatic.  Fetal heart rates remained in the 120s-130s with good variability.  Tocometer showed uterine irritability with infrequent contractions.  The patients blood pressures remained in the 130s-150s/70s-90s.  A Doppler ultrasound was performed on July 1, which showed an AFI of 9.3 cm, which is low normal, BPP of 8/8, and a systolic to diastolic ratio of 5.57, which is elevated.  There was no absent or reversed flow, and the gestational age was estimated at 70 weeks, which agreed with the LMP and the first ultrasound.  PIH labs remained stable, and the patient was also stable clinically.  Two doses of betamethasone had been given since admission, and induction proceeded with Cervidil.  Spontaneous membranes with clear fluid occurred approximately 1300 on June 29, 2002.  Low-dose Pitocin was started to augment contractions.  On July 4 at 2030 hours, a viable female infant was born by normal spontaneous vaginal delivery with Apgars of 3 at one minute, 6 at five minutes, and 7 at 10 minutes.  The infant weighed 1037 g and measured 38 cm  in length.  The infant was sent to the NICU, and an intact three-vessel cord placenta was delivered and cord blood was sent.  After the delivery the patient remained clinically stable with improvement in her lab work, and the magnesium was discontinued on July 5 after a good diuresis was achieved.  DISCHARGE CONDITION:          Stable with resolved preeclampsia.  DISPOSITION:                  Discharged to home without baby, who remained in the NICU.  DISCHARGE MEDICATIONS:        1. Prenatal vitamins one p.o. q.d.                               2. Ibuprofen 600 mg p.o.  q.6h. as needed for                                  cramping.                               3. Ortho-Novum 1/35 mcg to take as directed.  DISCHARGE INSTRUCTIONS:       Unrestricted activity.  Regular diet.  The patient is to follow up in six weeks at [facility]. Attending Physician:  Amada Kingfisher. DD:  07/01/02 TD:  07/04/02 Job: [id number]

## 2011-10-04 LAB — CBC
Hemoglobin: 11.7 — ABNORMAL LOW
RBC: 3.48 — ABNORMAL LOW
WBC: 8.2

## 2011-10-05 LAB — POCT URINALYSIS DIP (DEVICE)
Glucose, UA: NEGATIVE
Glucose, UA: NEGATIVE
Glucose, UA: 100 — AB
Glucose, UA: NEGATIVE
Ketones, ur: NEGATIVE
Ketones, ur: NEGATIVE
Nitrite: NEGATIVE
Operator id: 134861
Operator id: 194561
Operator id: 120861
Operator id: 159681
Specific Gravity, Urine: 1.01
Specific Gravity, Urine: 1.015
Specific Gravity, Urine: 1.01
Specific Gravity, Urine: 1.015
Urobilinogen, UA: 0.2
Urobilinogen, UA: 1
Urobilinogen, UA: 2 — ABNORMAL HIGH

## 2011-10-06 LAB — POCT URINALYSIS DIP (DEVICE)
Glucose, UA: NEGATIVE
Nitrite: NEGATIVE
Operator id: 148111
Specific Gravity, Urine: 1.01
Urobilinogen, UA: 1

## 2011-10-07 LAB — POCT URINALYSIS DIP (DEVICE)
Glucose, UA: NEGATIVE
Glucose, UA: NEGATIVE
Nitrite: NEGATIVE
Nitrite: NEGATIVE
Operator id: 135281
Operator id: 148111
Protein, ur: NEGATIVE
Protein, ur: NEGATIVE
Urobilinogen, UA: 1
Urobilinogen, UA: 2 — ABNORMAL HIGH

## 2011-10-11 LAB — POCT URINALYSIS DIP (DEVICE)
Glucose, UA: NEGATIVE
Glucose, UA: NEGATIVE
Hgb urine dipstick: NEGATIVE
Nitrite: NEGATIVE
Nitrite: NEGATIVE
Operator id: 120861
Operator id: 200901
Protein, ur: NEGATIVE
Specific Gravity, Urine: <=1.005
Urobilinogen, UA: 1
Urobilinogen, UA: 0.2

## 2011-10-13 ENCOUNTER — Emergency Department (HOSPITAL_COMMUNITY)
Admission: EM | Admit: 2011-10-13 | Discharge: 2011-10-13 | Disposition: A | Discharge status: Home / Self Care | Payer: Medicaid Other | Attending: Emergency Medicine | Admitting: Emergency Medicine

## 2011-10-13 DIAGNOSIS — K089 Disorder of teeth and supporting structures, unspecified: Secondary | ICD-10-CM | POA: Insufficient documentation

## 2011-10-13 DIAGNOSIS — R51 Headache: Secondary | ICD-10-CM | POA: Insufficient documentation

## 2011-10-13 DIAGNOSIS — K047 Periapical abscess without sinus: Secondary | ICD-10-CM | POA: Insufficient documentation

## 2011-10-13 DIAGNOSIS — R1310 Dysphagia, unspecified: Secondary | ICD-10-CM | POA: Insufficient documentation

## 2011-10-14 LAB — URINALYSIS, ROUTINE W REFLEX MICROSCOPIC
Bilirubin Urine: NEGATIVE
Glucose, UA: NEGATIVE
Hgb urine dipstick: NEGATIVE
Ketones, ur: NEGATIVE
Nitrite: NEGATIVE
Protein, ur: NEGATIVE
Specific Gravity, Urine: 1.01
Urobilinogen, UA: 0.2
pH: 7

## 2011-10-14 LAB — WET PREP, GENITAL

## 2011-10-14 LAB — URINE MICROSCOPIC-ADD ON

## 2011-10-14 LAB — GC/CHLAMYDIA PROBE AMP, GENITAL

## 2014-02-22 ENCOUNTER — Encounter (HOSPITAL_COMMUNITY): Payer: Self-pay | Admitting: Emergency Medicine

## 2014-02-22 ENCOUNTER — Emergency Department (HOSPITAL_COMMUNITY)
Admission: EM | Admit: 2014-02-22 | Discharge: 2014-02-22 | Disposition: A | Discharge status: Home / Self Care | Payer: Medicaid Other | Attending: Emergency Medicine | Admitting: Emergency Medicine

## 2014-02-22 DIAGNOSIS — L299 Pruritus, unspecified: Secondary | ICD-10-CM | POA: Insufficient documentation

## 2014-02-22 DIAGNOSIS — F17200 Nicotine dependence, unspecified, uncomplicated: Secondary | ICD-10-CM | POA: Insufficient documentation

## 2014-02-22 LAB — COMPREHENSIVE METABOLIC PANEL
ALK PHOS: 73 U/L (ref 39–117)
ALT: 15 U/L (ref 0–35)
AST: 23 U/L (ref 0–37)
Albumin: 4 g/dL (ref 3.5–5.2)
BILIRUBIN TOTAL: 0.4 mg/dL (ref 0.3–1.2)
BUN: 9 mg/dL (ref 6–23)
CHLORIDE: 104 meq/L (ref 96–112)
CO2: 23 meq/L (ref 19–32)
Calcium: 9.2 mg/dL (ref 8.4–10.5)
Creatinine, Ser: 0.86 mg/dL (ref 0.50–1.10)
GFR calc non Af Amer: >90 mL/min (ref >90)
GLUCOSE: 77 mg/dL (ref 70–99)
POTASSIUM: 4.2 meq/L (ref 3.7–5.3)
Sodium: 142 mEq/L (ref 137–147)
Total Protein: 7.4 g/dL (ref 6.0–8.3)

## 2014-02-22 LAB — BILIRUBIN, DIRECT

## 2014-02-22 MED ORDER — LORAZEPAM 1 MG PO TABS
1.0000 mg | ORAL_TABLET | Freq: Once | ORAL | Status: AC
  Administered 2014-02-22: 1 mg via ORAL
  Filled 2014-02-22: qty 2

## 2014-02-22 MED ORDER — LORAZEPAM 1 MG PO TABS: 1.0000 mg | ORAL_TABLET | Freq: Three times a day (TID) | ORAL | Status: DC | PRN

## 2014-02-22 MED ORDER — HYDROXYZINE HCL 25 MG PO TABS
25.0000 mg | ORAL_TABLET | Freq: Once | ORAL | Status: AC
  Administered 2014-02-22: 25 mg via ORAL
  Filled 2014-02-22: qty 1

## 2014-02-22 MED ORDER — PREDNISONE (PAK) 10 MG PO TABS: ORAL_TABLET | Freq: Every day | ORAL | Status: DC

## 2014-02-22 NOTE — Discharge Instructions (Signed)
Pruritus   Pruritis is an itch. There are many different problems that can cause an itch. Dry skin is one of the most common causes of itching. Most cases of itching do not require medical attention.   HOME CARE INSTRUCTIONS   Make sure your skin is moistened on a regular basis. A moisturizer that contains petroleum jelly is best for keeping moisture in your skin. If you develop a rash, you may try the following for relief:    Use corticosteroid cream.   Apply cool compresses to the affected areas.   Bathe with Epsom salts or baking soda in the bathwater.   Soak in colloidal oatmeal baths. These are available at your pharmacy.   Apply baking soda paste to the rash. Stir water into baking soda until it reaches a paste-like consistency.   Use an anti-itch lotion.   Take over-the-counter diphenhydramine medicine by mouth as the instructions direct.   Avoid scratching. Scratching may cause the rash to become infected. If itching is very bad, your caregiver may suggest prescription lotions or creams to lessen your symptoms.   Avoid hot showers, which can make itching worse. A cold shower may help with itching as long as you use a moisturizer after the shower.  SEEK MEDICAL CARE IF:  The itching does not go away after several days.  Document Released: 08/25/2011 Document Revised: 03/06/2012 Document Reviewed: 08/25/2011  Exit[employer] Patient Information 2014 [employer].

## 2014-02-22 NOTE — ED Notes (Signed)
Pt has been itching off and on for one month; denies changing soaps, detergents, body washes, etc. In last month.  Pt has used benadryl and other creams and ointments that will work temporarily; however, itching will eventually resume.  Pt has gotten to the point where she feels as thopugh she can't wear certain clothing because it fits too tight and causes itching.

## 2014-02-22 NOTE — ED Provider Notes (Signed)
CSN: [id number]     Arrival date & time 02/22/14  1803 History  This chart was scribed for non-physician practitioner, Charlann Lange, PA-C working with Saddie Benders. Dorna Mai, MD by Einar Pheasant, ED scribe. This patient was seen in room TR08C/TR08C and the patient's care was started at 8:42 PM.    Chief Complaint  Patient presents with  . Pruritis    The history is provided by the patient. No language interpreter was used.   HPI Comments: Heidi [name] is a 28 y.o. female who presents to the Emergency Department complaining of pruritis that started 1 week ago. She states that the itching can be anywhere but not rash ever appears. Pt states that her clothes have been irritating her. She states that the itching worsens when she gets hot. Pt reports taking Benadryl with no relief. She denies any similar episodes. Denies any abdominal pain, SOB, headaches, N, V. She reports daily alcohol use without known liver problems in the past. She quantifies her drinking as at least 2 40 oz beers daily.   History reviewed. No pertinent past medical history. History reviewed. No pertinent past surgical history. No family history on file. History  Substance Use Topics  . Smoking status: Current Every Day Smoker -- 0.50 packs/day    Types: Cigarettes  . Smokeless tobacco: Not on file  . Alcohol Use: Yes     Comment: PT drinks 2 40 oz beer per day    OB History   Grav Para Term Preterm Abortions TAB SAB Ect Mult Living                 Review of Systems  Constitutional: Negative for fever and chills.  Respiratory: Negative.   Cardiovascular: Negative.   Gastrointestinal: Negative.   Musculoskeletal: Negative.   Skin:       See HPI.  Neurological: Negative.       Allergies  Review of patient's allergies indicates no known allergies.  Home Medications  No current outpatient prescriptions on file.  BP 162/110  Pulse 80  Temp(Src) 98.2 F (36.8 C) (Oral)  Resp 18  Wt 156 lb 7 oz (70.96  kg)  SpO2 99%  LMP 01/27/2014  Physical Exam  Nursing note and vitals reviewed. Constitutional: She is oriented to person, place, and time. She appears well-developed and well-nourished.  HENT:  Head: Normocephalic and atraumatic.  Eyes: Conjunctivae are normal. Pupils are equal, round, and reactive to light.  Neck: Normal range of motion.  Cardiovascular: Normal rate, regular rhythm and normal heart sounds.   No murmur heard. Pulmonary/Chest: Effort normal and breath sounds normal. No respiratory distress. She has no wheezes. She has no rales.  Abdominal: She exhibits no distension.  Musculoskeletal: Normal range of motion.  Neurological: She is alert and oriented to person, place, and time.  Skin: Skin is warm and dry. No rash noted.  No redness, swelling, excoriation or rash.   Psychiatric: She has a normal mood and affect.    ED Course  Procedures (including critical care time)  DIAGNOSTIC STUDIES: Oxygen Saturation is 99% on RA, normal by my interpretation.    COORDINATION OF CARE: 8:49 PM- Will order liver function test. Pt advised of plan for treatment and pt agrees.  Labs Review Labs Reviewed - No data to display Imaging Review No results found.  EKG Interpretation  None  MDM   Final diagnoses:  None    1. Itching  No rash. Liver functions are WNL. No better with antihistamines  at home or in the ED. Ativan given with moderate relief of symptoms. REfer to dermatology for further evaluation.  I personally performed the services described in this documentation, which was scribed in my presence. The recorded information has been reviewed and is accurate.     Dewaine Oats, PA-C 02/23/14 0207

## 2014-02-22 NOTE — ED Notes (Signed)
Pt c/o intermittent itching for several weeks.  Denies rash.  Itching is not in one particular place, ie can be on arms, legs, "inside", toes, fingers, etc.  Pt sometimes feels like her eyes are burning.  Pt initially experienced some relief with benadryl, but it no longer works.  PT states the anti-itch lotion does work.

## 2014-03-05 NOTE — ED Provider Notes (Signed)
Medical screening examination/treatment/procedure(s) were performed by non-physician practitioner and as supervising physician I was immediately available for consultation/collaboration.   EKG Interpretation None        Karmel Patricelli Y. Suzane Vanderweide, MD 03/05/14 1341 

## 2014-03-11 ENCOUNTER — Emergency Department (INDEPENDENT_AMBULATORY_CARE_PROVIDER_SITE_OTHER)
Admission: EM | Admit: 2014-03-11 | Discharge: 2014-03-11 | Disposition: A | Discharge status: Home / Self Care | Payer: Self-pay | Source: Home / Self Care

## 2014-03-11 ENCOUNTER — Encounter (HOSPITAL_COMMUNITY): Payer: Self-pay | Admitting: Emergency Medicine

## 2014-03-11 DIAGNOSIS — M779 Enthesopathy, unspecified: Secondary | ICD-10-CM

## 2014-03-11 DIAGNOSIS — M2550 Pain in unspecified joint: Secondary | ICD-10-CM

## 2014-03-11 MED ORDER — TRIAMCINOLONE ACETONIDE 40 MG/ML IJ SUSP
40.0000 mg | Freq: Once | INTRAMUSCULAR | Status: AC
  Administered 2014-03-11: 40 mg via INTRAMUSCULAR

## 2014-03-11 MED ORDER — TRIAMCINOLONE ACETONIDE 40 MG/ML IJ SUSP
INTRAMUSCULAR | Status: AC
  Filled 2014-03-11: qty 1

## 2014-03-11 MED ORDER — METHYLPREDNISOLONE 4 MG PO KIT: PACK | ORAL | Status: DC

## 2014-03-11 NOTE — ED Provider Notes (Signed)
CSN: [id number]     Arrival date & time 03/11/14  1117 History   First MD Initiated Contact with Patient 03/11/14 1243     No chief complaint on file.  (Consider location/radiation/quality/duration/timing/severity/associated sxs/prior Treatment) HPI Comments: 28 year old female complaining of pain in bilateral ankles and knees for approximately 3 days. She states at times it feels as though her ankle going to give out. She denies any known medical problems, injuries or excessive use such as running or playing ball. 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   History reviewed. No pertinent past medical history. History reviewed. No pertinent past surgical history. No family history on file. History  Substance Use Topics  . Smoking status: Current Every Day Smoker -- 0.50 packs/day    Types: Cigarettes  . Smokeless tobacco: Not on file  . Alcohol Use: Yes     Comment: PT drinks 2 40 oz beer per day    OB History   Grav Para Term Preterm Abortions TAB SAB Ect Mult Living                 Review of Systems  Constitutional: Positive for activity change. Negative for fever, chills and fatigue.  HENT: Negative.   Respiratory: Negative.   Cardiovascular: Negative.   Musculoskeletal: Positive for arthralgias. Negative for back pain, joint swelling and neck pain.       As per HPI. No other joints involved  Skin: Negative for color change, pallor and rash.  Neurological: Negative.     Allergies  Review of patient's allergies indicates no known allergies.  Home Medications   Current Outpatient Rx  Name  Route  Sig  Dispense  Refill  . LORazepam (ATIVAN) 1 MG tablet   Oral   Take 1 tablet (1 mg total) by mouth every 8 (eight) hours as needed for anxiety.   10 tablet   0   . methylPREDNISolone (MEDROL DOSEPAK) 4 MG tablet      follow package directions. Take with food.   21 tablet   0    BP 139/79  Pulse 71  Temp(Src)  98.8 F (37.1 C) (Oral)  Resp 16  Ht 5\' 7"  (1.702 m)  Wt 158 lb (71.668 kg)  BMI 24.74 kg/m2  SpO2 97%  LMP 03/04/2014 Physical Exam  Nursing note and vitals reviewed. Constitutional: She is oriented to person, place, and time. She appears well-developed and well-nourished. No distress.  HENT:  Head: Normocephalic and atraumatic.  Eyes: EOM are normal.  Neck: Normal range of motion. Neck supple.  Pulmonary/Chest: Effort normal.  Musculoskeletal: Normal range of motion. She exhibits tenderness. She exhibits no edema.  Bilat knees with no swelling, discoloration or deformities.  Full ROM. Mild tenderness along the joint lines. N tenderness to patellar or pat-femoral ligaments. No tuberosity tenderness or swelling.  Bilat ankles without swelling, discoloration or deformities. Tender to anterior lower shin and anterior ankle. No bony tenderness. Full ROM. Pedal pulses 2+.   Neurological: She is alert and oriented to person, place, and time. No cranial nerve deficit.  Skin: Skin is warm and dry.  Psychiatric: She has a normal mood and affect.    ED Course  Procedures (including critical care time) Labs Review Labs Reviewed - No data to display Imaging Review No results found.   MDM   1. Polyarthralgia   2. Tendinitis    Kenalog 40 mg IM Medrol dose pack. Exercises as demo'd.  No signs of inflammatory disease. Expect overuse pain from job of 2 months.     Janne Napoleon, NP 03/11/14 1324

## 2014-03-11 NOTE — ED Provider Notes (Signed)
Medical screening examination/treatment/procedure(s) were performed by non-physician practitioner and as supervising physician I was immediately available for consultation/collaboration.  Philipp Deputy, M.D.  Harden Mo, MD 03/11/14 [phone]

## 2014-03-11 NOTE — Discharge Instructions (Signed)
Arthralgia °Your caregiver has diagnosed you as suffering from an arthralgia. Arthralgia means there is pain in a joint. This can come from many reasons including: °· Bruising the joint which causes soreness (inflammation) in the joint. °· Wear and tear on the joints which occur as we grow older (osteoarthritis). °· Overusing the joint. °· Various forms of arthritis. °· Infections of the joint. °Regardless of the cause of pain in your joint, most of these different pains respond to anti-inflammatory drugs and rest. The exception to this is when a joint is infected, and these cases are treated with antibiotics, if it is a bacterial infection. °HOME CARE INSTRUCTIONS  °· Rest the injured area for as long as directed by your caregiver. Then slowly start using the joint as directed by your caregiver and as the pain allows. Crutches as directed may be useful if the ankles, knees or hips are involved. If the knee was splinted or casted, continue use and care as directed. If an stretchy or elastic wrapping bandage has been applied today, it should be removed and re-applied every 3 to 4 hours. It should not be applied tightly, but firmly enough to keep swelling down. Watch toes and feet for swelling, bluish discoloration, coldness, numbness or excessive pain. If any of these problems (symptoms) occur, remove the ace bandage and re-apply more loosely. If these symptoms persist, contact your caregiver or return to this location. °· For the first 24 hours, keep the injured extremity elevated on pillows while lying down. °· Apply ice for 15-20 minutes to the sore joint every couple hours while awake for the first half day. Then 03-04 times per day for the first 48 hours. Put the ice in a plastic bag and place a towel between the bag of ice and your skin. °· Wear any splinting, casting, elastic bandage applications, or slings as instructed. °· Only take over-the-counter or prescription medicines for pain, discomfort, or fever as  directed by your caregiver. Do not use aspirin immediately after the injury unless instructed by your physician. Aspirin can cause increased bleeding and bruising of the tissues. °· If you were given crutches, continue to use them as instructed and do not resume weight bearing on the sore joint until instructed. °Persistent pain and inability to use the sore joint as directed for more than 2 to 3 days are warning signs indicating that you should see a caregiver for a follow-up visit as soon as possible. Initially, a hairline fracture (break in bone) may not be evident on X-rays. Persistent pain and swelling indicate that further evaluation, non-weight bearing or use of the joint (use of crutches or slings as instructed), or further X-rays are indicated. X-rays may sometimes not show a small fracture until a week or 10 days later. Make a follow-up appointment with your own caregiver or one to whom we have referred you. A radiologist (specialist in reading X-rays) may read your X-rays. Make sure you know how you are to obtain your X-ray results. Do not assume everything is normal if you do not hear from us. °SEEK MEDICAL CARE IF: °Bruising, swelling, or pain increases. °SEEK IMMEDIATE MEDICAL CARE IF:  °· Your fingers or toes are numb or blue. °· The pain is not responding to medications and continues to stay the same or get worse. °· The pain in your joint becomes severe. °· You develop a fever over 102° F (38.9° C). °· It becomes impossible to move or use the joint. °MAKE SURE YOU:  °·   Understand these instructions.  Will watch your condition.  Will get help right away if you are not doing well or get worse. Document Released: 12/13/2005 Document Revised: 03/06/2012 Document Reviewed: 07/31/2008 [facility] Patient Information 2014 [place].  Repetitive Strain Injuries Repetitive strain injuries (RSIs) result from overuse or misuse of soft tissues including muscles, tendons, or nerves. Tendons are the  cord-like structures that attach muscles to bones. RSIs can affect almost any part of the body. However, RSIs are most common in the arms (thumbs, wrists, elbows, shoulders) and legs (ankles, knees). Common medical conditions that are often caused by repetitive strain include carpal tunnel syndrome, tennis or golfer's elbow, bursitis, and tendonitis. If RSIs are treated early, and therepeated activity is reduced or removed, the severity and length of your problems can usually be reduced. RSIs are also called cumulative trauma disorders (CTD).  CAUSES  Many RSIs occur due to repeating the same activity at work over weeks or months without sufficient rest, such as prolonged typing. RSIs also commonly occur when a hobby or sport is done repeatedly without sufficient rest. RSIs can also occur due to repeated strain or stress on a body part in someone who has one or more risk factors for RSIs. RISK FACTORS Workplace risk factors  Frequent computer use, especially if your workstation is not adjusted for your body type.  Infrequent rest breaks.  Working in a high-pressure environment.  Working at a [employer].  Repeating the same motion, such as frequent typing.  Working in an awkward position or holding the same position for a long time.  Forceful movements such as lifting, pulling, or pushing.  Vibration caused by using power tools.  Working in cold temperatures.  Job stress. Personal risk factors  Poor posture.  Being loose-jointed.  Not exercising regularly.  Being overweight.  Arthritis, diabetes, thyroid problems, or other long-term (chronic)medical conditions.  Vitamin deficiencies.  Keeping your fingernails long.  An unhealthy, stressful, or inactive lifestyle.  Not sleeping well. SYMPTOMS  Symptoms often begin at work but become more noticeable after the repeated stress has ended. For example, you may develop fatigue or soreness in your wrist while typingat work, and  at night you may develop numbness and tingling in your fingers. Common symptoms include:   Burning, shooting, or aching pain, especially in the fingers, palms, wrists, forearms, or shoulders.  Tenderness.  Swelling.  Tingling, numbness, or loss of feeling.  Pain with certain activities, such as turning a doorknob or reaching above your head.  Weakness, heaviness, or loss of coordination in yourhand.  Muscle spasms or tightness. In some cases, symptoms can become so intense that it is difficult to perform everyday tasks. Symptoms that do not improve with rest may indicate a more serious condition.  DIAGNOSIS  Your caregiver may determine the type ofRSI you have based on your medical evaluation and a description of your activities.  TREATMENT  Treatment depends on the severity and type of RSI you have. Your caregiver may recommend rest for the affected body part, medicines, and physical or occupational therapy to reduce pain, swelling, and soreness. Discuss the activities you do repeatedly with your caregiver. Your caregiver can help you decide whether you need to change your activities. An RSI may take months or years to heal, especially if the affected body part gets insufficient rest. In some cases, such as severe carpal tunnel syndrome, surgery may be recommended. PREVENTION  Talk with your supervisor to make sure you have the proper equipment for  your work station.  Maintain good posture at your desk or work station with:  Feet flat on the floor.  Knees directly over the feet, bent at a right angle.  Lower back supported by your chair or a cushion in the curve of your lower back.  Shoulders and arms relaxed and at your sides.  Neck relaxed and not bent forwards or backwards.  Your desk and computer workstation properly adjusted to your body type.  Your chair adjusted so there is no excess pressure on the back of your thighs.  The keyboard resting above your thighs. You  should be able to reach the keys with your elbows at your side, bent at a right angle. Your arms should be supported on forearm rests, with your forearms parallel to the ground.  The computer mouse within easy reach.  The monitor directly in front of you, so that your eyes are aligned with the top of the screen. The screen should be about 15 to 25 inches from your eyes.  While typing, keep your wrist straight, in a neutral position. Move your entire arm when you move your mouse or when typing hard-to-reach keys.  Only use your computer as much as you need to for work. Do not use it during breaks.  Take breaks often from any repeated activity. Alternate with another task which requires you to use different muscles, or rest at least once every hour.  Change positions regularly. If you spend a lot of time sitting, get up, walk around, and stretch.  Do not hold pens or pencils tightly when writing.  Exercise regularly.  Maintain a normal weight.  Eat a diet with plenty of vegetables, whole grains, and fruit.  Get sufficient, restful sleep. HOME CARE INSTRUCTIONS  If your caregiver prescribed medicine to help reduce swelling, take it as directed.  Only take over-the-counter or prescription medicines for pain, discomfort, or fever as directed by your caregiver.  Reduce, and if needed, stopthe activities that are causing your problems until you have no further symptoms.If your symptoms are work-related, you may need to talk to your supervisor about changing your activities.  When symptoms develop, put ice or a cold pack on the aching area.  Put ice in a plastic bag.  Place a towel between your skin and the bag.  Leave the ice on for 15-20 minutes.  If you were given a splint to keep your wrist from bending, wear it as instructed. It is important to wear the splint at night. Use the splint for as long as your caregiver recommends. SEEK MEDICAL CARE IF:  You develop new  problems.  Your problems do not get better with medicine. MAKE SURE YOU:  Understand these instructions.  Will watch your condition.  Will get help right away if you are not doing well or get worse. Document Released: 12/03/2002 Document Revised: 06/13/2012 Document Reviewed: 02/03/2012 [facility] Patient Information 2014 [place], [place].  Tendinitis Tendinitis is swelling and inflammation of the tendons. Tendons are band-like tissues that connect muscle to bone. Tendinitis commonly occurs in the:   Shoulders (rotator cuff).  Heels (Achilles tendon).  Elbows (triceps tendon). CAUSES Tendinitis is usually caused by overusing the tendon, muscles, and joints involved. When the tissue surrounding a tendon (synovium) becomes inflamed, it is called tenosynovitis. Tendinitis commonly develops in people whose jobs require repetitive motions. SYMPTOMS  Pain.  Tenderness.  Mild swelling. DIAGNOSIS Tendinitis is usually diagnosed by physical exam. Your caregiver may also order X-rays or other imaging  tests. TREATMENT Your caregiver may recommend certain medicines or exercises for your treatment. HOME CARE INSTRUCTIONS   Use a sling or splint for as long as directed by your caregiver until the pain decreases.  Put ice on the injured area.  Put ice in a plastic bag.  Place a towel between your skin and the bag.  Leave the ice on for 15-20 minutes, 03-04 times a day.  Avoid using the limb while the tendon is painful. Perform gentle range of motion exercises only as directed by your caregiver. Stop exercises if pain or discomfort increase, unless directed otherwise by your caregiver.  Only take over-the-counter or prescription medicines for pain, discomfort, or fever as directed by your caregiver. SEEK MEDICAL CARE IF:   Your pain and swelling increase.  You develop new, unexplained symptoms, especially increased numbness in the hands. MAKE SURE YOU:   Understand these  instructions.  Will watch your condition.  Will get help right away if you are not doing well or get worse. Document Released: 12/10/2000 Document Revised: 03/06/2012 Document Reviewed: 03/01/2011 [facility] Patient Information 2014 [place], [place].

## 2014-03-11 NOTE — ED Notes (Signed)
28 yr old female is here today with complaints of joint pain in her ankles and knees that started Saturday. She states she has not fallen or injured her self. She states she has noticed swelling and redness in her ankles in her left leg.  Denies: SOB; Chest pain, fever

## 2014-05-15 ENCOUNTER — Emergency Department (HOSPITAL_COMMUNITY)
Admission: EM | Admit: 2014-05-15 | Discharge: 2014-05-15 | Disposition: A | Discharge status: Home / Self Care | Payer: Self-pay | Attending: Emergency Medicine | Admitting: Emergency Medicine

## 2014-05-15 ENCOUNTER — Encounter (HOSPITAL_COMMUNITY): Payer: Self-pay | Admitting: Emergency Medicine

## 2014-05-15 DIAGNOSIS — F17200 Nicotine dependence, unspecified, uncomplicated: Secondary | ICD-10-CM | POA: Insufficient documentation

## 2014-05-15 DIAGNOSIS — B86 Scabies: Secondary | ICD-10-CM | POA: Insufficient documentation

## 2014-05-15 MED ORDER — PERMETHRIN 5 % EX CREA: 1.0000 "application " | TOPICAL_CREAM | Freq: Once | CUTANEOUS | Status: DC

## 2014-05-15 NOTE — ED Provider Notes (Signed)
CSN: [id number]     Arrival date & time 05/15/14  1052 History   First MD Initiated Contact with Patient 05/15/14 1057     Chief Complaint  Patient presents with  . Rash     (Consider location/radiation/quality/duration/timing/severity/associated sxs/prior Treatment) HPI Comments: Vaccinations are up to date per family.    Patient is a 28 y.o. female presenting with rash. The history is provided by the patient and a parent.  Rash Location:  Full body Quality: itchiness and redness   Severity:  Moderate Onset quality:  Gradual Timing:  Constant Progression:  Spreading Chronicity:  New Context comment:  Child with similar symptoms Relieved by:  Nothing Worsened by:  Nothing tried Ineffective treatments:  None tried Associated symptoms: no abdominal pain, no fever, no joint pain, no myalgias, no throat swelling, no tongue swelling, not vomiting and not wheezing     History reviewed. No pertinent past medical history. History reviewed. No pertinent past surgical history. No family history on file. History  Substance Use Topics  . Smoking status: Current Every Day Smoker -- 0.50 packs/day    Types: Cigarettes  . Smokeless tobacco: Not on file  . Alcohol Use: Yes     Comment: PT drinks 2 40 oz beer per day    OB History   Grav Para Term Preterm Abortions TAB SAB Ect Mult Living                 Review of Systems  Constitutional: Negative for fever.  Respiratory: Negative for wheezing.   Gastrointestinal: Negative for vomiting and abdominal pain.  Musculoskeletal: Negative for arthralgias and myalgias.  Skin: Positive for rash.  All other systems reviewed and are negative.     Allergies  Review of patient's allergies indicates no known allergies.  Home Medications   Prior to Admission medications   Medication Sig Start Date End Date Taking? Authorizing Provider  LORazepam (ATIVAN) 1 MG tablet Take 1 tablet (1 mg total) by mouth every 8 (eight) hours as needed  for anxiety. 02/22/14   Shari A Upstill, PA-C  methylPREDNISolone (MEDROL DOSEPAK) 4 MG tablet follow package directions. Take with food. 03/11/14   Janne Napoleon, NP  permethrin (ACTICIN) 5 % cream Apply 1 application topically once. Apply to body from neck to toes and leave on for 8-10 hours then wash off.  Repeat in 7-10 days qs 05/15/14   Avie Arenas, MD   BP 125/85  Pulse 88  Temp(Src) 99.3 F (37.4 C) (Temporal)  Resp 14  Wt 153 lb 11.2 oz (69.718 kg)  SpO2 100% Physical Exam  Nursing note and vitals reviewed. Constitutional: She is oriented to person, place, and time. She appears well-developed and well-nourished.  HENT:  Head: Normocephalic.  Right Ear: External ear normal.  Left Ear: External ear normal.  Nose: Nose normal.  Mouth/Throat: Oropharynx is clear and moist.  Eyes: EOM are normal. Pupils are equal, round, and reactive to light. Right eye exhibits no discharge. Left eye exhibits no discharge.  Neck: Normal range of motion. Neck supple. No tracheal deviation present.  No nuchal rigidity no meningeal signs  Cardiovascular: Normal rate and regular rhythm.   Pulmonary/Chest: Effort normal and breath sounds normal. No stridor. No respiratory distress. She has no wheezes. She has no rales.  Abdominal: Soft. She exhibits no distension and no mass. There is no tenderness. There is no rebound and no guarding.  Musculoskeletal: Normal range of motion. She exhibits no edema and no tenderness.  Neurological:  She is alert and oriented to person, place, and time. She has normal reflexes. No cranial nerve deficit. Coordination normal.  Skin: Skin is warm. Rash noted. She is not diaphoretic. No erythema. No pallor.  No pettechia no purpura  multiple macules located on hands arms back and legs no induration no fluctuance or tenderness no spreading erythema no petechiae no purpura    ED Course  Procedures (including critical care time) Labs Review Labs Reviewed - No data to  display  Imaging Review No results found.   EKG Interpretation None      MDM   Final diagnoses:  Scabies    I have reviewed the patient's past medical records and nursing notes and used this information in my decision-making process.  Patient seen in the emergency room in February for similar symptoms and concerns had normal liver function tests at that time to ensure no evidence of liver failure as cause of intense pruritus. Patient now presents to the emergency room with continued itching. Patient's son is also being seen by myself and is clinical evidence of scabies on exam. Discussed with patient and will treat with permethrin cream and have followup with PCP if not improving. No evidence of superinfection noted. Patient is nontoxic and well-appearing at time of discharge home.    Avie Arenas, MD 05/15/14 [phone]

## 2014-05-15 NOTE — ED Notes (Signed)
Pt going to PEDS with child

## 2014-05-15 NOTE — ED Notes (Signed)
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

## 2014-05-15 NOTE — Discharge Instructions (Signed)
Scabies  Scabies are small bugs (mites) that burrow under the skin and cause red bumps and severe itching. These bugs can only be seen with a microscope. Scabies are highly contagious. They can spread easily from person to person by direct contact. They are also spread through sharing clothing or linens that have the scabies mites living in them. It is not unusual for an entire family to become infected through shared towels, clothing, or bedding.   HOME CARE INSTRUCTIONS   · Your caregiver may prescribe a cream or lotion to kill the mites. If cream is prescribed, massage the cream into the entire body from the neck to the bottom of both feet. Also massage the cream into the scalp and face if your child is less than 1 year old. Avoid the eyes and mouth. Do not wash your hands after application.  · Leave the cream on for 8 to 12 hours. Your child should bathe or shower after the 8 to 12 hour application period. Sometimes it is helpful to apply the cream to your child right before bedtime.  · One treatment is usually effective and will eliminate approximately 95% of infestations. For severe cases, your caregiver may decide to repeat the treatment in 1 week. Everyone in your household should be treated with one application of the cream.  · New rashes or burrows should not appear within 24 to 48 hours after successful treatment. However, the itching and rash may last for 2 to 4 weeks after successful treatment. Your caregiver may prescribe a medicine to help with the itching or to help the rash go away more quickly.  · Scabies can live on clothing or linens for up to 3 days. All of your child's recently used clothing, towels, stuffed toys, and bed linens should be washed in hot water and then dried in a dryer for at least 20 minutes on high heat. Items that cannot be washed should be enclosed in a plastic bag for at least 3 days.  · To help relieve itching, bathe your child in a cool bath or apply cool washcloths to the  affected areas.  · Your child may return to school after treatment with the prescribed cream.  SEEK MEDICAL CARE IF:   · The itching persists longer than 4 weeks after treatment.  · The rash spreads or becomes infected. Signs of infection include red blisters or yellow-tan crust.  Document Released: 12/13/2005 Document Revised: 03/06/2012 Document Reviewed: 04/23/2009  [employer]® Patient Information ©2014 [employer].

## 2016-03-16 ENCOUNTER — Emergency Department (HOSPITAL_COMMUNITY): Payer: Self-pay

## 2016-03-16 ENCOUNTER — Inpatient Hospital Stay (HOSPITAL_COMMUNITY): Payer: Self-pay

## 2016-03-16 ENCOUNTER — Inpatient Hospital Stay (HOSPITAL_COMMUNITY)
Admission: EM | Admit: 2016-03-16 | Discharge: 2016-03-23 | DRG: 987 | Disposition: A | Discharge status: Home / Self Care | Payer: Self-pay | Attending: Pulmonary Disease | Admitting: Pulmonary Disease

## 2016-03-16 ENCOUNTER — Encounter (HOSPITAL_COMMUNITY): Payer: Self-pay | Admitting: Emergency Medicine

## 2016-03-16 DIAGNOSIS — J9601 Acute respiratory failure with hypoxia: Secondary | ICD-10-CM | POA: Diagnosis present

## 2016-03-16 DIAGNOSIS — J690 Pneumonitis due to inhalation of food and vomit: Secondary | ICD-10-CM

## 2016-03-16 DIAGNOSIS — R0600 Dyspnea, unspecified: Secondary | ICD-10-CM

## 2016-03-16 DIAGNOSIS — E875 Hyperkalemia: Secondary | ICD-10-CM | POA: Diagnosis present

## 2016-03-16 DIAGNOSIS — I313 Pericardial effusion (noninflammatory): Secondary | ICD-10-CM | POA: Diagnosis present

## 2016-03-16 DIAGNOSIS — J45909 Unspecified asthma, uncomplicated: Secondary | ICD-10-CM | POA: Diagnosis present

## 2016-03-16 DIAGNOSIS — Z978 Presence of other specified devices: Secondary | ICD-10-CM

## 2016-03-16 DIAGNOSIS — A419 Sepsis, unspecified organism: Secondary | ICD-10-CM | POA: Diagnosis present

## 2016-03-16 DIAGNOSIS — R590 Localized enlarged lymph nodes: Secondary | ICD-10-CM | POA: Diagnosis present

## 2016-03-16 DIAGNOSIS — J9690 Respiratory failure, unspecified, unspecified whether with hypoxia or hypercapnia: Secondary | ICD-10-CM

## 2016-03-16 DIAGNOSIS — Z7952 Long term (current) use of systemic steroids: Secondary | ICD-10-CM

## 2016-03-16 DIAGNOSIS — L409 Psoriasis, unspecified: Secondary | ICD-10-CM | POA: Diagnosis present

## 2016-03-16 DIAGNOSIS — F17210 Nicotine dependence, cigarettes, uncomplicated: Secondary | ICD-10-CM | POA: Diagnosis present

## 2016-03-16 DIAGNOSIS — J9602 Acute respiratory failure with hypercapnia: Secondary | ICD-10-CM | POA: Diagnosis present

## 2016-03-16 DIAGNOSIS — R0602 Shortness of breath: Secondary | ICD-10-CM

## 2016-03-16 DIAGNOSIS — D72829 Elevated white blood cell count, unspecified: Secondary | ICD-10-CM | POA: Diagnosis present

## 2016-03-16 DIAGNOSIS — J984 Other disorders of lung: Secondary | ICD-10-CM

## 2016-03-16 DIAGNOSIS — R591 Generalized enlarged lymph nodes: Secondary | ICD-10-CM

## 2016-03-16 DIAGNOSIS — J189 Pneumonia, unspecified organism: Principal | ICD-10-CM | POA: Diagnosis present

## 2016-03-16 DIAGNOSIS — R918 Other nonspecific abnormal finding of lung field: Secondary | ICD-10-CM

## 2016-03-16 DIAGNOSIS — J80 Acute respiratory distress syndrome: Secondary | ICD-10-CM

## 2016-03-16 DIAGNOSIS — R000 Tachycardia, unspecified: Secondary | ICD-10-CM | POA: Diagnosis present

## 2016-03-16 HISTORY — DX: Unspecified asthma, uncomplicated: J45.909

## 2016-03-16 LAB — MAGNESIUM
MAGNESIUM: 2.3 mg/dL (ref 1.7–2.4)
Magnesium: 2.2 mg/dL (ref 1.7–2.4)

## 2016-03-16 LAB — BASIC METABOLIC PANEL
ANION GAP: 9 (ref 5–15)
Anion gap: 9 (ref 5–15)
BUN: 6 mg/dL (ref 6–20)
BUN: 7 mg/dL (ref 6–20)
CHLORIDE: 99 mmol/L — AB (ref 101–111)
CO2: 27 mmol/L (ref 22–32)
CO2: 28 mmol/L (ref 22–32)
Calcium: 8.2 mg/dL — ABNORMAL LOW (ref 8.9–10.3)
Calcium: 8.4 mg/dL — ABNORMAL LOW (ref 8.9–10.3)
Chloride: 98 mmol/L — ABNORMAL LOW (ref 101–111)
Creatinine, Ser: 0.67 mg/dL (ref 0.44–1.00)
Creatinine, Ser: 0.59 mg/dL (ref 0.44–1.00)
GFR calc Af Amer: >60 mL/min (ref >60)
GFR calc non Af Amer: >60 mL/min (ref >60)
GLUCOSE: 175 mg/dL — AB (ref 65–99)
Glucose, Bld: 129 mg/dL — ABNORMAL HIGH (ref 65–99)
POTASSIUM: 4.2 mmol/L (ref 3.5–5.1)
Potassium: 3.7 mmol/L (ref 3.5–5.1)
SODIUM: 136 mmol/L (ref 135–145)
Sodium: 134 mmol/L — ABNORMAL LOW (ref 135–145)

## 2016-03-16 LAB — CBC WITH DIFFERENTIAL/PLATELET
BASOS ABS: 0 10*3/uL (ref 0.0–0.1)
BASOS PCT: 0 %
Basophils Absolute: 0 10*3/uL (ref 0.0–0.1)
Basophils Relative: 0 %
EOS ABS: 0 10*3/uL (ref 0.0–0.7)
Eosinophils Absolute: 0 10*3/uL (ref 0.0–0.7)
Eosinophils Relative: 0 %
Eosinophils Relative: 0 %
HCT: 34.6 % — ABNORMAL LOW (ref 36.0–46.0)
HEMATOCRIT: 38.1 % (ref 36.0–46.0)
HEMOGLOBIN: 11.3 g/dL — AB (ref 12.0–15.0)
Hemoglobin: 12.4 g/dL (ref 12.0–15.0)
LYMPHS PCT: 3 %
Lymphocytes Relative: 2 %
Lymphs Abs: 0.4 10*3/uL — ABNORMAL LOW (ref 0.7–4.0)
Lymphs Abs: 0.3 10*3/uL — ABNORMAL LOW (ref 0.7–4.0)
MCH: 30.2 pg (ref 26.0–34.0)
MCH: 29.3 pg (ref 26.0–34.0)
MCHC: 32.5 g/dL (ref 30.0–36.0)
MCHC: 32.7 g/dL (ref 30.0–36.0)
MCV: 92.9 fL (ref 78.0–100.0)
MCV: 89.6 fL (ref 78.0–100.0)
MONO ABS: 0.3 10*3/uL (ref 0.1–1.0)
MONOS PCT: 3 %
MONOS PCT: 3 %
Monocytes Absolute: 0.4 10*3/uL (ref 0.1–1.0)
NEUTROS PCT: 95 %
Neutro Abs: 12.9 10*3/uL — ABNORMAL HIGH (ref 1.7–7.7)
Neutro Abs: 13 10*3/uL — ABNORMAL HIGH (ref 1.7–7.7)
Neutrophils Relative %: 94 %
Platelets: 593 10*3/uL — ABNORMAL HIGH (ref 150–400)
Platelets: 593 10*3/uL — ABNORMAL HIGH (ref 150–400)
RBC: 4.1 MIL/uL (ref 3.87–5.11)
RBC: 3.86 MIL/uL — ABNORMAL LOW (ref 3.87–5.11)
RDW: 14.1 % (ref 11.5–15.5)
RDW: 14.2 % (ref 11.5–15.5)
WBC: 13.7 10*3/uL — ABNORMAL HIGH (ref 4.0–10.5)
WBC: 13.7 10*3/uL — ABNORMAL HIGH (ref 4.0–10.5)

## 2016-03-16 LAB — BLOOD GAS, ARTERIAL
ACID-BASE EXCESS: 0.9 mmol/L (ref 0.0–2.0)
Acid-Base Excess: 1.4 mmol/L (ref 0.0–2.0)
Bicarbonate: 25.5 mEq/L — ABNORMAL HIGH (ref 20.0–24.0)
Bicarbonate: 26.4 mEq/L — ABNORMAL HIGH (ref 20.0–24.0)
DRAWN BY: 103701
DRAWN BY: 11249
O2 CONTENT: 3.5 L/min
O2 Content: 8 L/min
O2 SAT: 87.9 %
O2 Saturation: 88.6 %
PATIENT TEMPERATURE: 98.6
PCO2 ART: 43.2 mmHg (ref 35.0–45.0)
PCO2 ART: 47 mmHg — AB (ref 35.0–45.0)
PH ART: 7.37 (ref 7.350–7.450)
Patient temperature: 99.1
TCO2: 23.3 mmol/L (ref 0–100)
TCO2: 24.4 mmol/L (ref 0–100)
pH, Arterial: 7.389 (ref 7.350–7.450)
pO2, Arterial: 61.7 mmHg — ABNORMAL LOW (ref 80.0–100.0)
pO2, Arterial: 64.9 mmHg — ABNORMAL LOW (ref 80.0–100.0)

## 2016-03-16 LAB — I-STAT BETA HCG BLOOD, ED (MC, WL, AP ONLY)

## 2016-03-16 LAB — PROCALCITONIN: Procalcitonin: 0.13 ng/mL

## 2016-03-16 LAB — PROTIME-INR
INR: 1.21 (ref 0.00–1.49)
PROTHROMBIN TIME: 15.4 s — AB (ref 11.6–15.2)

## 2016-03-16 LAB — ETHANOL: Alcohol, Ethyl (B): <5 mg/dL (ref <5)

## 2016-03-16 LAB — INFLUENZA PANEL BY PCR (TYPE A & B)
H1N1 flu by pcr: NOT DETECTED
Influenza A By PCR: NEGATIVE
Influenza B By PCR: NEGATIVE

## 2016-03-16 LAB — LACTIC ACID, PLASMA
LACTIC ACID, VENOUS: 1.9 mmol/L (ref 0.5–2.0)
Lactic Acid, Venous: 1.3 mmol/L (ref 0.5–2.0)

## 2016-03-16 LAB — APTT: APTT: 31 s (ref 24–37)

## 2016-03-16 LAB — PHOSPHORUS
PHOSPHORUS: 4.2 mg/dL (ref 2.5–4.6)
PHOSPHORUS: 4.3 mg/dL (ref 2.5–4.6)

## 2016-03-16 LAB — STREP PNEUMONIAE URINARY ANTIGEN: STREP PNEUMO URINARY ANTIGEN: NEGATIVE

## 2016-03-16 LAB — TSH: TSH: 0.486 u[IU]/mL (ref 0.350–4.500)

## 2016-03-16 LAB — MRSA PCR SCREENING: MRSA BY PCR: NEGATIVE

## 2016-03-16 MED ORDER — IOHEXOL 300 MG/ML  SOLN
75.0000 mL | Freq: Once | INTRAMUSCULAR | Status: AC | PRN
  Administered 2016-03-16: 75 mL via INTRAVENOUS

## 2016-03-16 MED ORDER — CEFEPIME HCL 1 G IJ SOLR
1.0000 g | Freq: Three times a day (TID) | INTRAMUSCULAR | Status: DC
  Administered 2016-03-16 – 2016-03-22 (×18): 1 g via INTRAVENOUS
  Filled 2016-03-16 (×24): qty 1

## 2016-03-16 MED ORDER — IPRATROPIUM BROMIDE 0.02 % IN SOLN
0.5000 mg | Freq: Once | RESPIRATORY_TRACT | Status: AC
  Administered 2016-03-16: 0.5 mg via RESPIRATORY_TRACT
  Filled 2016-03-16: qty 2.5

## 2016-03-16 MED ORDER — ONDANSETRON HCL 4 MG/2ML IJ SOLN: 4.0000 mg | Freq: Four times a day (QID) | INTRAMUSCULAR | Status: DC | PRN

## 2016-03-16 MED ORDER — SODIUM CHLORIDE 0.9 % IV SOLN: INTRAVENOUS | Status: DC

## 2016-03-16 MED ORDER — SODIUM CHLORIDE 0.9 % IV BOLUS (SEPSIS)
1000.0000 mL | Freq: Once | INTRAVENOUS | Status: AC
  Administered 2016-03-16: 1000 mL via INTRAVENOUS

## 2016-03-16 MED ORDER — ONDANSETRON HCL 4 MG PO TABS: 4.0000 mg | ORAL_TABLET | Freq: Four times a day (QID) | ORAL | Status: DC | PRN

## 2016-03-16 MED ORDER — METHYLPREDNISOLONE SODIUM SUCC 125 MG IJ SOLR
60.0000 mg | Freq: Two times a day (BID) | INTRAMUSCULAR | Status: DC
  Administered 2016-03-16 – 2016-03-17 (×3): 60 mg via INTRAVENOUS
  Filled 2016-03-16 (×3): qty 2

## 2016-03-16 MED ORDER — LEVALBUTEROL HCL 1.25 MG/0.5ML IN NEBU
1.2500 mg | INHALATION_SOLUTION | RESPIRATORY_TRACT | Status: DC | PRN
  Filled 2016-03-16: qty 0.5

## 2016-03-16 MED ORDER — ALBUTEROL SULFATE (2.5 MG/3ML) 0.083% IN NEBU
5.0000 mg | INHALATION_SOLUTION | Freq: Once | RESPIRATORY_TRACT | Status: DC
Start: 2016-03-16 — End: 2016-03-16

## 2016-03-16 MED ORDER — DEXTROSE 5 % IV SOLN
500.0000 mg | Freq: Once | INTRAVENOUS | Status: AC
  Administered 2016-03-16: 500 mg via INTRAVENOUS
  Filled 2016-03-16: qty 500

## 2016-03-16 MED ORDER — ACETAMINOPHEN 650 MG RE SUPP: 650.0000 mg | Freq: Four times a day (QID) | RECTAL | Status: DC | PRN

## 2016-03-16 MED ORDER — IPRATROPIUM BROMIDE 0.02 % IN SOLN: 0.5000 mg | RESPIRATORY_TRACT | Status: DC | PRN

## 2016-03-16 MED ORDER — ALBUTEROL SULFATE (2.5 MG/3ML) 0.083% IN NEBU
5.0000 mg | INHALATION_SOLUTION | Freq: Once | RESPIRATORY_TRACT | Status: AC
  Administered 2016-03-16: 5 mg via RESPIRATORY_TRACT
  Filled 2016-03-16: qty 6

## 2016-03-16 MED ORDER — VANCOMYCIN HCL IN DEXTROSE 750-5 MG/150ML-% IV SOLN
750.0000 mg | Freq: Three times a day (TID) | INTRAVENOUS | Status: DC
Start: 2016-03-16 — End: 2016-03-18
  Administered 2016-03-16 – 2016-03-18 (×5): 750 mg via INTRAVENOUS
  Filled 2016-03-16 (×7): qty 150

## 2016-03-16 MED ORDER — SODIUM CHLORIDE 0.9 % IV SOLN
INTRAVENOUS | Status: DC
  Administered 2016-03-16 – 2016-03-19 (×2): via INTRAVENOUS

## 2016-03-16 MED ORDER — IPRATROPIUM BROMIDE 0.02 % IN SOLN
0.5000 mg | RESPIRATORY_TRACT | Status: DC
Start: 2016-03-16 — End: 2016-03-20
  Administered 2016-03-16 – 2016-03-20 (×25): 0.5 mg via RESPIRATORY_TRACT
  Filled 2016-03-16 (×25): qty 2.5

## 2016-03-16 MED ORDER — ACETAMINOPHEN 325 MG PO TABS
650.0000 mg | ORAL_TABLET | Freq: Four times a day (QID) | ORAL | Status: DC | PRN
  Administered 2016-03-16: 650 mg via ORAL
  Filled 2016-03-16 (×2): qty 2

## 2016-03-16 MED ORDER — DEXTROSE 5 % IV SOLN
1.0000 g | Freq: Once | INTRAVENOUS | Status: AC
  Administered 2016-03-16: 1 g via INTRAVENOUS
  Filled 2016-03-16: qty 10

## 2016-03-16 MED ORDER — LEVALBUTEROL HCL 1.25 MG/0.5ML IN NEBU
1.2500 mg | INHALATION_SOLUTION | RESPIRATORY_TRACT | Status: DC
  Administered 2016-03-16 – 2016-03-20 (×26): 1.25 mg via RESPIRATORY_TRACT
  Filled 2016-03-16 (×26): qty 0.5

## 2016-03-16 MED ORDER — BUDESONIDE 0.5 MG/2ML IN SUSP
0.5000 mg | Freq: Two times a day (BID) | RESPIRATORY_TRACT | Status: DC
Start: 2016-03-16 — End: 2016-03-22
  Administered 2016-03-16 – 2016-03-21 (×10): 0.5 mg via RESPIRATORY_TRACT
  Filled 2016-03-16 (×12): qty 2

## 2016-03-16 MED ORDER — DIPHENHYDRAMINE HCL 25 MG PO CAPS
25.0000 mg | ORAL_CAPSULE | Freq: Four times a day (QID) | ORAL | Status: DC | PRN
  Administered 2016-03-16 – 2016-03-17 (×2): 25 mg via ORAL
  Filled 2016-03-16 (×2): qty 1

## 2016-03-16 MED ORDER — SODIUM CHLORIDE 0.9% FLUSH
3.0000 mL | Freq: Two times a day (BID) | INTRAVENOUS | Status: DC
  Administered 2016-03-16 – 2016-03-22 (×10): 3 mL via INTRAVENOUS

## 2016-03-16 NOTE — ED Notes (Signed)
RT AT THE BEDSIDE.

## 2016-03-16 NOTE — Consult Note (Signed)
Name: Heidi [name] MRN: [id number] DOB: 13-Aug-1986    ADMISSION DATE:  03/16/2016 CONSULTATION DATE:  3/21  REFERRING MD :  Charlies Silvers   CHIEF COMPLAINT:  Abnormal CT scan   BRIEF PATIENT DESCRIPTION:  This is a 30 year old female w/ no sig medical history. Presented to the ED on w/ cc: cough and resting dyspnea. CT chest obtained showing extensive LUL/RLL consolidative PNA w/ diffuse LAN. PCCM consulted w/ concern for malignancy.   SIGNIFICANT EVENTS    STUDIES:  CT chest 3/21: Extensive adenopathy throughout the hila, mediastinum, and right axillary region as well as in the visualized upper abdominal retroperitoneal regions. Neoplastic etiology highly likely.Widespread pneumonitis bilaterally. There is obstruction of the left upper lobe bronchus, likely due to tumor with postobstructive pneumonitis. There is collapse of the right lower lobe without well-defined endobronchial lesion. More patchy infiltrate is noted elsewhere as noted above. Given the findings in the lungs, bronchoscopy could be most helpful to assess degree of potential tumor versus pneumonitis.No adrenal lesions evident.   HISTORY OF PRESENT ILLNESS:  This is a 29 year old female w/ no sig medical history. Presented to the ED on w/ cc: cough and resting dyspnea. Symptoms started about 2 weeks prior to presentation. Initially w/ nasal congestion and then productive cough w/ green sputum. The cough continued, this was associated w/ increased shortness of breath for which she presented to the ED on 3/21. She denied fever, did have chills. Denied sick exposure, night sweats, sore throat, did have some chest discomfort w/ cough. On presentation was tachycardic w/ increased RR.  CT chest obtained showing extensive LUL/RLL consolidative PNA w/ diffuse LAN. She was placed on broad spec abx and to be admitted by the IM service. PCCM consulted w/ concern for malignancy.    PAST MEDICAL HISTORY :   has a past medical history  of Asthma.  has no past surgical history on file. Prior to Admission medications   Medication Sig Start Date End Date Taking? Authorizing Provider  guaiFENesin (MUCINEX) 600 MG 12 hr tablet Take 1,200 mg by mouth 2 (two) times daily as needed for cough or to loosen phlegm.   Yes Historical Provider, MD   No Known Allergies  FAMILY HISTORY:  family history is not on file. SOCIAL HISTORY:  reports that she has been smoking Cigarettes.  She has been smoking about 0.50 packs per day. She does not have any smokeless tobacco history on file. She reports that she drinks alcohol.  REVIEW OF SYSTEMS:   Constitutional: Negative for fever, chills, weight loss->unintentional, malaise/fatigue and diaphoresis.  HENT: Negative for hearing loss, ear pain, nosebleeds, nasal congestion, sore throat, neck pain, tinnitus and ear discharge.   Eyes: Negative for blurred vision, double vision, photophobia, pain, discharge and redness.  Respiratory: Productive cough-->green sputum, hemoptysis,+shortness of breath, wheezing and stridor.   Cardiovascular: Negative for chest pain, + chest tightness, no palpitations, orthopnea, claudication, leg swelling and PND.  Gastrointestinal: Negative for heartburn, nausea, vomiting, abdominal pain, diarrhea, constipation, blood in stool and melena.  Genitourinary: Negative for dysuria, urgency, frequency, hematuria and flank pain.  Musculoskeletal: Negative for myalgias, back pain, joint pain and falls.  Skin: Negative for itching and rash.  Neurological: Negative for dizziness, tingling, tremors, sensory change, speech change, focal weakness, seizures, loss of consciousness, weakness and headaches.  Endo/Heme/Allergies: Negative for environmental allergies and polydipsia. Does not bruise/bleed easily.  SUBJECTIVE:  No distress.  VITAL SIGNS: Temp:  [99.8 F (37.7 C)] 99.8 F (37.7  C) (03/21 0757) Pulse Rate:  [124-130] 130 (03/21 1109) Resp:  [25-26] 26 (03/21  1109) BP: (135-142)/(80-85) 135/80 mmHg (03/21 1109) SpO2:  [86 %-99 %] 99 % (03/21 1109) Weight:  [155 lb (70.308 kg)] 155 lb (70.308 kg) (03/21 1216)  PHYSICAL EXAMINATION: General:  Acutely ill appearing aaf, not in acute distress, but very short of breath w/ sig cough  Neuro:  Awake, oriented. No focal def.  HEENT:  NCAT, some painful adenopathy right neck  Cardiovascular:  Tachy rrr no MRG Lungs:  Diffuse rhonchi, decreased on right base.  Abdomen:  Soft, not tender + bowel sounds  Musculoskeletal:  Intact, good strength and bulk  Skin:  Dry, warm, intact    Recent Labs Lab 03/16/16 0915  NA 134*  K 3.7  CL 98*  CO2 27  BUN 6  CREATININE 0.67  GLUCOSE 175*    Recent Labs Lab 03/16/16 0915  HGB 12.4  HCT 38.1  WBC 13.7*  PLT 593*   Dg Chest 2 View  03/16/2016  CLINICAL DATA:  Chest pain, shortness of breath, coughing for a couple of weeks, smoker for 15 years, asthma EXAM: CHEST  2 VIEW COMPARISON:  None. FINDINGS: Borderline cardiomegaly. There is extensive consolidation left upper lobe hilar region and perihilar region. Mass or adenopathy cannot be excluded Patchy consolidation is noted in right lower lobe. Although findings may be due to bilateral pneumonia neoplastic process cannot be excluded. Further correlation with enhanced CT scan of the chest is recommended. IMPRESSION: There is extensive consolidation left upper lobe hilar region and perihilar region. Mass or adenopathy cannot be excluded. Patchy consolidation is noted in right lower lobe. Although findings may be due to bilateral pneumonia neoplastic process cannot be excluded. Further correlation with enhanced CT scan of the chest is recommended. These results were called by telephone at the time of interpretation on 03/16/2016 at 8:58 am to Dr. Charlann Lange , who verbally acknowledged these results. Electronically Signed   By: Lahoma Crocker M.D.   On: 03/16/2016 08:59   Ct Chest W Contrast  03/16/2016  CLINICAL  DATA:  Shortness of Breath EXAM: CT CHEST WITH CONTRAST TECHNIQUE: Multidetector CT imaging of the chest was performed during intravenous contrast administration. CONTRAST:  57mL OMNIPAQUE IOHEXOL 300 MG/ML  SOLN COMPARISON:  Chest radiograph March 16, 2016 FINDINGS: Mediastinum/Lymph Nodes: Thyroid appears unremarkable. There is widespread adenopathy. There are enlarged lymph nodes throughout the right axilla with the largest individual lymph node in the right axilla measuring 3.4 x 2.6 cm. There is extensive anterior mediastinal adenopathy. The largest individual lymph node in the anterior mediastinum measures 2.3 x 1.5 cm. There are multiple right peritracheal lymph nodes with the largest individual lymph node in the right peritracheal region 9 measuring 2.0 x 1.5 cm. There are lymph nodes tracking along the left-side of the aortic arch. A lymph node in the aortopulmonary window region measures 2.0 x 1.3 cm. There are lymph nodes in each hilar region. The largest lymph node in the right hilum measures 2.3 x 1.4 cm. Lymph nodes in the left hilum appear matched Ed there are difficult to distinguish from 1 another. There is a lymph node in the inferior left hilum measuring 1.6 x 1.3 cm. There is sub- carinal adenopathy with the largest sub- carinal lymph node measuring 2.2 x 1.8 cm. There is no thoracic aortic aneurysm or dissection. The visualized great vessels appear unremarkable. There is no major vessel pulmonary embolus evident. There is a rather minimal pericardial  effusion. Lungs/Pleura: There is consolidation throughout essentially the entire left upper lobe. There is abrupt termination of the proximal left upper lobe bronchus, likely due to an endobronchial lesion in the left upper lobe with postobstructive pneumonitis. There is widespread airspace consolidation throughout the right upper lobe. There is consolidation of most of the right lower lobe with narrowing of the right lower lobe bronchus. An  endobronchial lesion on the right is not demonstrated, although there may well be neoplasm in the right lower lobe with surrounding pneumonitis. A lesser degree of infiltrate is noted in the right middle lobe. Patchy pneumonia is also noted in the right lower lobe, primarily in the posterior segment region. Lingula is collapsed along with the remainder of the left upper lobe. There is no appreciable pleural effusion. Upper abdomen: In the visualized upper abdomen, there is extensive retroperitoneal adenopathy. The largest individual lymph node visualized is located between the aorta and left kidney measuring 2.1 x 1.3 cm. A second lymph node between the aorta and inferior vena cava measures 2.0 x 1.2 cm. Adrenals appear unremarkable. Visualized upper abdominal structures otherwise appear unremarkable. Musculoskeletal: No blastic or lytic bone lesions are evident. IMPRESSION: Extensive adenopathy throughout the hila, mediastinum, and right axillary region as well as in the visualized upper abdominal retroperitoneal regions. 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 Widespread pneumonitis bilaterally. There is obstruction of the left upper lobe bronchus, likely due to tumor with postobstructive pneumonitis. There is collapse of the right lower lobe without well-defined endobronchial lesion. More patchy infiltrate is noted elsewhere as noted above. Given the findings in the lungs, bronchoscopy could be most helpful to assess degree of potential tumor versus pneumonitis. No adrenal lesions evident. Electronically Signed   By: Lowella Grip III M.D.   On: 03/16/2016 11:19    ASSESSMENT / PLAN: Acute hypoxic respiratory failure  Multi-focal Pneumonia  R/o lung mass w/ post-obstructive process Wide spread LAN H/o asthma   Plan Cont broad spec abx Agree w/ U strep and legionella antigen, influenza pcr  Send Sputum PCR  Scheduled BDs Ok to cont systemic steroids Agree w/ hiv Will ask IR to eval for US guided LN  Bx on right axilla  Will ultimately need FOB to eval for endobronchial lesion even if we get dx from bx (scheduled for Friday at 0900) Admit to [facility] [place] Pager # [phone] OR # [phone] if no answer   03/16/2016, 12:51 PM

## 2016-03-16 NOTE — ED Notes (Signed)
Bed: WA15 Expected date:  Expected time:  Means of arrival:  Comments: EMS-asthma 

## 2016-03-16 NOTE — ED Provider Notes (Signed)
CSN: [id number]     Arrival date & time 03/16/16  0753 History   First MD Initiated Contact with Patient 03/16/16 0755     Chief Complaint  Patient presents with  . Asthma     (Consider location/radiation/quality/duration/timing/severity/associated sxs/prior Treatment) HPI Comments: Patient with a history of asthma and a smoker presents with 2 weeks of intermittently productive cough, chest tightness and progressive wheezing. She reports subjective fever. She is out of her asthma medication which is limited to an inhaler. SOB markedly worse this morning prompting call to EMS. She reports being unable to walk across a room without significant shortness of breath. She denies nausea, vomiting or abdominal pain. She reports no history of admissions for asthma, and that she rarely has to use inhaler.   Patient is a 30 y.o. female presenting with asthma. The history is provided by the patient and the EMS personnel. No language interpreter was used.  Asthma Associated symptoms include chills, congestion, coughing and a fever. Pertinent negatives include no abdominal pain, chest pain, myalgias, nausea, rash or vomiting.    Past Medical History  Diagnosis Date  . Asthma    History reviewed. No pertinent past surgical history. No family history on file. Social History  Substance Use Topics  . Smoking status: Current Every Day Smoker -- 0.50 packs/day    Types: Cigarettes  . Smokeless tobacco: None  . Alcohol Use: Yes     Comment: PT drinks 2 40 oz beer per day    OB History    No data available     Review of Systems  Constitutional: Positive for fever and chills.  HENT: Positive for congestion.   Respiratory: Positive for cough, shortness of breath and wheezing.   Cardiovascular: Negative for chest pain.  Gastrointestinal: Negative for nausea, vomiting and abdominal pain.  Musculoskeletal: Negative for myalgias.  Skin: Negative for rash.      Allergies  Review of patient's  allergies indicates no known allergies.  Home Medications   Prior to Admission medications   Medication Sig Start Date End Date Taking? Authorizing Provider  LORazepam (ATIVAN) 1 MG tablet Take 1 tablet (1 mg total) by mouth every 8 (eight) hours as needed for anxiety. 02/22/14   Charlann Lange, PA-C  methylPREDNISolone (MEDROL DOSEPAK) 4 MG tablet follow package directions. Take with food. 03/11/14   Janne Napoleon, NP  permethrin (ACTICIN) 5 % cream Apply 1 application topically once. Apply to body from neck to toes and leave on for 8-10 hours then wash off.  Repeat in 7-10 days qs 05/15/14   Isaac Bliss, MD   BP 142/85 mmHg  Pulse 124  Temp(Src) 99.8 F (37.7 C) (Oral)  Resp 25  SpO2 95% Physical Exam  Constitutional: She is oriented to person, place, and time. She appears well-developed and well-nourished.  HENT:  Head: Normocephalic.  Neck: Normal range of motion. Neck supple.  Cardiovascular: Regular rhythm.  Tachycardia present.   Pulmonary/Chest: Effort normal. No accessory muscle usage. Tachypnea noted. No respiratory distress. She has wheezes. She has rales. She exhibits no tenderness and no retraction.  Abdominal: Soft. Bowel sounds are normal. There is no tenderness. There is no rebound and no guarding.  Musculoskeletal: Normal range of motion.  Neurological: She is alert and oriented to person, place, and time.  Skin: Skin is warm and dry. No rash noted.  Psychiatric: She has a normal mood and affect.    ED Course  Procedures (including critical care time) Labs Review Labs Reviewed  CBC WITH DIFFERENTIAL/PLATELET  BASIC METABOLIC PANEL    Imaging Review No results found. I have personally reviewed and evaluated these images and lab results as part of my medical decision-making.   EKG Interpretation None      MDM   Final diagnoses:  None    1. Lung mass 2. pneumonitis  On arrival the patient has tachypnea, no hypoxia, is moving air in all fields with  rhonchi and minimal wheezing. She received, per nursing notes, 10 mg Albuterol, soludmedrol, magnesium and zofran in route. She reports that she currently feels better. Will continue to observe. CXR, CBC, Bmet ordered.  8:45 - Recheck, she is more tachypnic and reports recurrent SOB. No accessory muscle use, no retractions. Albuterol/Atrovent ordered. Labs and imaging pending.  9:00 - Spoke with radiology regarding CXR - significant abnormalities that appear as multifocal consolidations - infection vs mass/tumor. CT chest w/CM ordered per radiology recommendation. IV Abx started - blood cultures, lactate ordered. Discussed findings with the patient.   11:45 - CT results showing:  "IMPRESSION: Extensive adenopathy throughout the hila, mediastinum, and right axillary region as well as in the visualized upper abdominal retroperitoneal regions. 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  Widespread pneumonitis bilaterally. There is obstruction of the left upper lobe bronchus, likely due to tumor with postobstructive pneumonitis. There is collapse of the right lower lobe without well-defined endobronchial lesion. More patchy infiltrate is noted elsewhere as noted above."  Patient updated on results and need for admission to pursue definitive diagnosis  Discussed with Dr. Charlies Silvers, [facility], who accepts for admission. Initially, patient to be admitted to telemetry, subsequently changed to stepdown by Dr. Charlies Silvers after pulmonology consultation initiated by her. Patient updated. All questions answered.  Charlann Lange, PA-C 03/16/16 [place], MD 03/16/16 [phone]

## 2016-03-16 NOTE — Procedures (Signed)
Interventional Radiology Procedure Note  Procedure: US guided core biopsy RIGHT axillary node  Complications: None  Estimated Blood Loss: 0  Recommendations:  - Path pending   Signed,  Criselda Peaches, MD

## 2016-03-16 NOTE — ED Notes (Signed)
Bed: [id number] Expected date:  Expected time:  Means of arrival:  Comments: Hold for pt in IR

## 2016-03-16 NOTE — Progress Notes (Signed)
Pharmacy Antibiotic Follow-up Note  Heidi [name] is a 30 y.o. year-old female admitted on 03/16/2016 with productive cough, wheezing, SHOB.  The patient is currently on day 1/8 of Vancomycin & Cefepime for post-obstructive PNA. Hx of asthma, rare rescue inhaler use per patient. Chest CT with extensive adenopathy, post-obstructive PNA, obstruction of LUL. bronchus, RLL collapse, diffuse infiltrate.  Assessment/Plan: Vancomycin 750mg  IV every 8 hours.  Goal trough 15-20 mcg/mL.  Cefepime 1gm q8hr  Temp (24hrs), Avg:99.8 F (37.7 C), Min:99.8 F (37.7 C), Max:99.8 F (37.7 C)   Recent Labs Lab 03/16/16 0915  WBC 13.7*    Recent Labs Lab 03/16/16 0915  CREATININE 0.67   CrCl cannot be calculated (Unknown ideal weight.).    No Known Allergies  Antimicrobials this admission: 3/21 Rocephin & Azithromcyin x1 in ED  3/21 Vancomycin >>  3/21 Cefepime >>  Levels/dose changes this admission:  Microbiology results: 3/21 BCx: sent 3/21 Influenza panel: ordered 3/21 Strep pneumo/Legionella: ordered 3/21 SputumCx: ordered  Thank you for allowing pharmacy to be a part of this patient's care.  Minda Ditto PharmD Pager [phone] 03/16/2016, 12:51 PM

## 2016-03-16 NOTE — Progress Notes (Signed)
Entered in d/c instructions  Please use the resources provided to you in emergency room by case manager to assist with doctor for follow up A referral for you has been sent to [facility] if you have not received a call in 3 days you may contact them Call Karen Andrianos at [phone] Tuesday-Friday [email or web address] These [place] uninsured resources provide possible primary care providers, resources for discounted medications, housing, dental resources, affordable care act information, plus other resources for [place]   

## 2016-03-16 NOTE — Progress Notes (Signed)
CM spoke with pt who confirms uninsured [employer] resident with no pcp.  CM discussed and provided written information for uninsured accepting pcps, discussed the importance of pcp vs EDP services for f/u care, www.needymeds.org, www.goodrx.com, discounted pharmacies and other [employer] such as [employer] [facility] [employer], affordable care act, financial assistance, uninsured dental services, [place] med assist, DSS and  health department  Reviewed resources for [employer] uninsured accepting pcps like [facility], family medicine at [employer], [facility] [place] [facility], [facility], local urgent care centers, [facility], [place] [facility], general medical clinics, family services of the [place], [facility] urgent care plus others, medication resources, [facility] out patient pharmacies and housing Pt voiced understanding and appreciation of resources provided   Provided [facility] contact information Pt agreed to a referral Cm completed referral Pt to be contact by [facility] clinical liason Pt has not had an orange card before but states she would like one  Very pleasant  RN in with pt attempting to gain iv access  Placed pt t shirt, keys, carmel brown wallet in a pt belonging bag with uninsured resources Discussed she would be asked to follow up with specialists and probably a pcp Pt voiced appreciation and understanding

## 2016-03-16 NOTE — ED Notes (Signed)
Per EMS-[facility] due to asthma-productive cough for over a week-out of inhaler for awhile-Neb treatment in route-fever,wheezing, tight, B/L rhonchi-10 mg albuterol, 1 mg Atrovent, 125 mg solumedrol, 4 mg of Zofran, 2 grams of IV Magnesium given in route-wheezing resolved

## 2016-03-16 NOTE — H&P (Signed)
[facility] History and Physical  Heidi [name] MWN:[id number] DOB: Jan 14, 1986 DOA: 03/16/2016  Referring physician: ER PA: Elmyra Ricks  PCP: No primary care provider on file. - will set up with [facility] On discharge   Chief Complaint: shortness of breath   HPI:  30 y.o. year old female without significant past medical history, she is a smoker for past 15 years. She presented to [facility] ED with ongoing cough and shortness of breath for past 2 weeks prior to this admission. She tried mucinex but this has not provided significant symptomatic relief. Pt reports shortness of breath initially on exertion but now short of breath even at rest. No reports of fevers. She has chest tightness with cough. No reports of night sweats. No reports of weight loss. No sick contacts recently. No abdominal pain, nausea or vomiting. No lightheadedness or loss of consciousness. No reports of blood in stool or urine.   In ED, pt is hemodynamically stable. Oxygen saturation is 86% on room air. This has improve dto 100% with [place] oxygen support. Blood work showed mild leukocytosis of 13.7, normal lactic acid. There is concern for bilateral pneumonia and possible malignancy. CT scan also showed significant lymphadenopathy in the mediastinum, hilar area, right axillary area, upper abdomen. Possible lung cancer as primary malignancy or lymphoma. Started on broad spectrum abx and pulmonary consulted as well for possible need for bronchoscopy.  Assessment & Plan    Principal Problem:   Acute respiratory failure with hypoxia ([place]) / Sepsis due to Pneumonitis / Postobstructive pneumonia / Leukocytosis - Hypoxia likely due to extent of consolidation in left and right lung - Sepsis criteria met on admission with tachycardia, tachypnea, hypoxia, leukocytosis. Pneumonia from obstructing tumor likely a source  - Sepsis order set placed - Pneumonia order set placed - She was started on vanco and cefepime while awaiting blood, resp  cultures, HIV, legionella, strep pneumonia results - Started Pulmicort, xopenex, atrovent nebs - Started solumedrol 60 mg IV Q 12 hours  - Monitor in SDU - Pulmonary consulted  Active Problems:   Lung mass / Lymphadenopathy - CT scan with findings of extensive adenopathy throughout the hila, mediastinum and right axillary region as well as in the upper abdominal retroperitoneal regions. 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 Widespread pneumonitis bilaterally. There is obstruction of the left upper lobe bronchus, likely due to tumor with postobstructive pneumonitis. There is collapse of the right lower lobe without well-defined endobronchial lesion.  - Appreciate pulmonary input - plan for right axillary node biopsy - Plan for bronch in few days as well - Will need oncology referral in hospital once we know whether this is malignancy versus pneumonitis/sarcoidosis?    DVT prophylaxis:  - SCD's bilaterally   Radiological Exams on Admission: Dg Chest 2 View 03/16/2016 There is extensive consolidation left upper lobe hilar region and perihilar region. Mass or adenopathy cannot be excluded. Patchy consolidation is noted in right lower lobe. Although findings may be due to bilateral pneumonia neoplastic process cannot be excluded. Further correlation with enhanced CT scan of the chest is recommended. These results were called by telephone at the time of interpretation on 03/16/2016 at 8:58 am to Dr. Charlann Lange , who verbally acknowledged these results. Electronically Signed   By: Lahoma Crocker M.D.   On: 03/16/2016 08:59   Ct Chest W Contrast 03/16/2016  Extensive adenopathy throughout the hila, mediastinum, and right axillary region as well as in the visualized upper abdominal retroperitoneal regions. 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 Widespread pneumonitis bilaterally.  There is obstruction of the left upper lobe bronchus, likely due to tumor with postobstructive pneumonitis. There is collapse of the  right lower lobe without well-defined endobronchial lesion. More patchy infiltrate is noted elsewhere as noted above. Given the findings in the lungs, bronchoscopy could be most helpful to assess degree of potential tumor versus pneumonitis. No adrenal lesions evident. Electronically Signed   By: Lowella Grip III M.D.   On: 03/16/2016 11:19    EKG: Not done in ED  Code Status: Full Family Communication: Plan of care discussed with the patient  Disposition Plan: Admit for further evaluation, SDU due to acute resp failure with hypoxia   [name], MD  [facility] Pager [phone]  Time spent in minutes: 75 minutes  Review of Systems:  Constitutional: Negative for fever, chills and malaise/fatigue. Negative for diaphoresis.  HENT: Negative for hearing loss, ear pain, nosebleeds, congestion, sore throat, neck pain, tinnitus and ear discharge.   Eyes: Negative for blurred vision, double vision, photophobia, pain, discharge and redness.  Respiratory: per HPI Cardiovascular: Negative for chest pain, palpitations, orthopnea, claudication and leg swelling.  Gastrointestinal: Negative for nausea, vomiting and abdominal pain. Negative for heartburn, constipation, blood in stool and melena.  Genitourinary: Negative for dysuria, urgency, frequency, hematuria and flank pain.  Musculoskeletal: Negative for myalgias, back pain, joint pain and falls.  Skin: Negative for itching and rash.  Neurological: Negative for dizziness and weakness. Negative for tingling, tremors, sensory change, speech change, focal weakness, loss of consciousness and headaches.  Endo/Heme/Allergies: Negative for environmental allergies and polydipsia. Does not bruise/bleed easily.  Psychiatric/Behavioral: Negative for suicidal ideas. The patient is not nervous/anxious.      Past Medical History  Diagnosis Date  . Asthma    History reviewed. No pertinent past surgical history. Social History:  reports that she  has been smoking Cigarettes.  She has been smoking about 0.50 packs per day. She does not have any smokeless tobacco history on file. She reports that she drinks alcohol. Her drug history is not on file.  No Known Allergies  Family History: hypertension in mother    Prior to Admission medications   Medication Sig Start Date End Date Taking? Authorizing Provider  guaiFENesin (MUCINEX) 600 MG 12 hr tablet Take 1,200 mg by mouth 2 (two) times daily as needed for cough or to loosen phlegm.   Yes Historical Provider, MD   Physical Exam: Filed Vitals:   03/16/16 0757 03/16/16 0800 03/16/16 1109  BP: 142/85  135/80  Pulse: 124  130  Temp: 99.8 F (37.7 C)    TempSrc: Oral    Resp: 25  26  SpO2: 86% 95% 99%    Physical Exam  Constitutional: Appears well-developed and well-nourished. No distress.  HENT: Normocephalic. No tonsillar erythema or exudates Eyes: Conjunctivae are normal. No scleral icterus.  Neck: Normal ROM. Neck supple. No JVD. No tracheal deviation. No thyromegaly.  CVS: RRR, S1/S2 +, no murmurs, no gallops, no carotid bruit.  Pulmonary: diminished throughout, appreciate rhonchi  Abdominal: Soft. BS +,  no distension, tenderness, rebound or guarding.  Musculoskeletal: Normal range of motion. No edema and no tenderness.  Lymphadenopathy: No lymphadenopathy noted, cervical, inguinal. Neuro: Alert. Normal reflexes, muscle tone coordination. No focal neurologic deficits. Skin: Skin is warm and dry. No rash noted.  No erythema. No pallor.  Psychiatric: Normal mood and affect. Behavior, judgment, thought content normal.   Labs on Admission:  Basic Metabolic Panel:  Recent Labs Lab 03/16/16 0915  NA 134*  K 3.7  CL 98*  CO2 27  GLUCOSE 175*  BUN 6  CREATININE 0.67  CALCIUM 8.2*  MG 2.2   Liver Function Tests: No results for input(s): AST, ALT, ALKPHOS, BILITOT, PROT, ALBUMIN in the last 168 hours. No results for input(s): LIPASE, AMYLASE in the last 168 hours. No  results for input(s): AMMONIA in the last 168 hours. CBC:  Recent Labs Lab 03/16/16 0915  WBC 13.7*  NEUTROABS 12.9*  HGB 12.4  HCT 38.1  MCV 92.9  PLT 593*   Cardiac Enzymes: No results for input(s): CKTOTAL, CKMB, CKMBINDEX, TROPONINI in the last 168 hours. BNP: Invalid input(s): POCBNP CBG: No results for input(s): GLUCAP in the last 168 hours.  If 7PM-7AM, please contact night-coverage [email or web address] Password [facility] 03/16/2016, 12:06 PM

## 2016-03-16 NOTE — Progress Notes (Signed)
Patient ID: Heidi [name], female   DOB: October 29, 1986, 30 y.o.   MRN: [id number] Request for axillary biopsy of lymph node.  Will plan to do today if able.  The procedure has been discussed with the patient and she understands and agrees to proceed. Risks and Benefits discussed with the patient including, but not limited to bleeding, infection, damage to adjacent structures or low yield requiring additional tests. All of the patient's questions were answered, patient is agreeable to proceed. Consent signed and in chart.  [name] E 3:11 PM 03/16/2016

## 2016-03-17 ENCOUNTER — Inpatient Hospital Stay (HOSPITAL_COMMUNITY): Payer: MEDICAID | Admitting: Certified Registered Nurse Anesthetist

## 2016-03-17 ENCOUNTER — Inpatient Hospital Stay (HOSPITAL_COMMUNITY): Payer: MEDICAID

## 2016-03-17 ENCOUNTER — Inpatient Hospital Stay (HOSPITAL_COMMUNITY): Payer: Self-pay

## 2016-03-17 DIAGNOSIS — J9601 Acute respiratory failure with hypoxia: Secondary | ICD-10-CM

## 2016-03-17 DIAGNOSIS — J9690 Respiratory failure, unspecified, unspecified whether with hypoxia or hypercapnia: Secondary | ICD-10-CM | POA: Diagnosis present

## 2016-03-17 DIAGNOSIS — A419 Sepsis, unspecified organism: Secondary | ICD-10-CM

## 2016-03-17 DIAGNOSIS — J189 Pneumonia, unspecified organism: Principal | ICD-10-CM

## 2016-03-17 LAB — BLOOD GAS, ARTERIAL
ACID-BASE EXCESS: 3 mmol/L — AB (ref 0.0–2.0)
Bicarbonate: 30.2 mEq/L — ABNORMAL HIGH (ref 20.0–24.0)
DRAWN BY: 441261
FIO2: 1
LHR: 26 {breaths}/min
MECHVT: 360 mL
O2 SAT: 99 %
PEEP/CPAP: 14 cmH2O
PH ART: 7.297 — AB (ref 7.350–7.450)
Patient temperature: 99.3
TCO2: 28.2 mmol/L (ref 0–100)
pCO2 arterial: 64.2 mmHg (ref 35.0–45.0)
pO2, Arterial: 172 mmHg — ABNORMAL HIGH (ref 80.0–100.0)

## 2016-03-17 LAB — BASIC METABOLIC PANEL
Anion gap: 8 (ref 5–15)
BUN: 6 mg/dL (ref 6–20)
CALCIUM: 8.5 mg/dL — AB (ref 8.9–10.3)
CHLORIDE: 101 mmol/L (ref 101–111)
CO2: 27 mmol/L (ref 22–32)
CREATININE: 0.62 mg/dL (ref 0.44–1.00)
GFR calc non Af Amer: >60 mL/min (ref >60)
Glucose, Bld: 150 mg/dL — ABNORMAL HIGH (ref 65–99)
Potassium: 4.3 mmol/L (ref 3.5–5.1)
SODIUM: 136 mmol/L (ref 135–145)

## 2016-03-17 LAB — CBC
HEMATOCRIT: 34.7 % — AB (ref 36.0–46.0)
HEMOGLOBIN: 10.9 g/dL — AB (ref 12.0–15.0)
MCH: 29.5 pg (ref 26.0–34.0)
MCHC: 31.4 g/dL (ref 30.0–36.0)
MCV: 93.8 fL (ref 78.0–100.0)
Platelets: 606 10*3/uL — ABNORMAL HIGH (ref 150–400)
RBC: 3.7 MIL/uL — ABNORMAL LOW (ref 3.87–5.11)
RDW: 14.3 % (ref 11.5–15.5)
WBC: 18.6 10*3/uL — ABNORMAL HIGH (ref 4.0–10.5)

## 2016-03-17 LAB — RAPID URINE DRUG SCREEN, HOSP PERFORMED
AMPHETAMINES: NOT DETECTED
BARBITURATES: NOT DETECTED
BENZODIAZEPINES: NOT DETECTED
COCAINE: NOT DETECTED
Opiates: NOT DETECTED
Tetrahydrocannabinol: NOT DETECTED

## 2016-03-17 LAB — GLUCOSE, CAPILLARY
GLUCOSE-CAPILLARY: 151 mg/dL — AB (ref 65–99)
GLUCOSE-CAPILLARY: 141 mg/dL — AB (ref 65–99)
GLUCOSE-CAPILLARY: 112 mg/dL — AB (ref 65–99)

## 2016-03-17 LAB — MAGNESIUM: Magnesium: 2.2 mg/dL (ref 1.7–2.4)

## 2016-03-17 LAB — TRIGLYCERIDES: TRIGLYCERIDES: 55 mg/dL (ref <150)

## 2016-03-17 LAB — HIV ANTIBODY (ROUTINE TESTING W REFLEX)
HIV SCREEN 4TH GENERATION: NONREACTIVE
HIV Screen 4th Generation wRfx: NONREACTIVE

## 2016-03-17 LAB — PHOSPHORUS: PHOSPHORUS: 4.2 mg/dL (ref 2.5–4.6)

## 2016-03-17 LAB — STREP PNEUMONIAE URINARY ANTIGEN: STREP PNEUMO URINARY ANTIGEN: NEGATIVE

## 2016-03-17 MED ORDER — FENTANYL CITRATE (PF) 100 MCG/2ML IJ SOLN
100.0000 ug | Freq: Once | INTRAMUSCULAR | Status: AC | PRN
  Administered 2016-03-17: 100 ug via INTRAVENOUS
  Filled 2016-03-17: qty 2

## 2016-03-17 MED ORDER — PROPOFOL 1000 MG/100ML IV EMUL
0.0000 ug/kg/min | INTRAVENOUS | Status: DC
  Administered 2016-03-17: 12:00:00 via INTRAVENOUS
  Administered 2016-03-17: 45 ug/kg/min via INTRAVENOUS
  Administered 2016-03-17: 25 ug/kg/min via INTRAVENOUS
  Administered 2016-03-17 – 2016-03-20 (×9): 50 ug/kg/min via INTRAVENOUS
  Filled 2016-03-17 (×13): qty 100

## 2016-03-17 MED ORDER — SODIUM CHLORIDE 0.9 % IV SOLN
25.0000 ug/h | INTRAVENOUS | Status: DC
  Administered 2016-03-17: 200 ug/h via INTRAVENOUS
  Administered 2016-03-17: 25 ug/h via INTRAVENOUS
  Administered 2016-03-17: 100 ug/h via INTRAVENOUS
  Administered 2016-03-17 – 2016-03-19 (×6): 300 ug/h via INTRAVENOUS
  Filled 2016-03-17 (×7): qty 50

## 2016-03-17 MED ORDER — MIDAZOLAM HCL 2 MG/2ML IJ SOLN
INTRAMUSCULAR | Status: AC
  Administered 2016-03-17: 4 mg
  Filled 2016-03-17: qty 4

## 2016-03-17 MED ORDER — FENTANYL CITRATE (PF) 100 MCG/2ML IJ SOLN
100.0000 ug | Freq: Once | INTRAMUSCULAR | Status: AC
  Administered 2016-03-17: 100 ug via INTRAVENOUS

## 2016-03-17 MED ORDER — METHYLPREDNISOLONE SODIUM SUCC 125 MG IJ SOLR
60.0000 mg | Freq: Four times a day (QID) | INTRAMUSCULAR | Status: DC
  Administered 2016-03-17 – 2016-03-19 (×7): 60 mg via INTRAVENOUS
  Filled 2016-03-17 (×7): qty 2

## 2016-03-17 MED ORDER — PROPOFOL 1000 MG/100ML IV EMUL
INTRAVENOUS | Status: AC
  Filled 2016-03-17: qty 100

## 2016-03-17 MED ORDER — ARTIFICIAL TEARS OP OINT
1.0000 "application " | TOPICAL_OINTMENT | Freq: Three times a day (TID) | OPHTHALMIC | Status: DC
  Administered 2016-03-17 – 2016-03-19 (×7): 1 via OPHTHALMIC
  Filled 2016-03-17: qty 3.5

## 2016-03-17 MED ORDER — SODIUM CHLORIDE 0.9 % IV SOLN: 250.0000 mL | INTRAVENOUS | Status: DC | PRN

## 2016-03-17 MED ORDER — SODIUM CHLORIDE 0.9 % IV SOLN
3.0000 ug/kg/min | INTRAVENOUS | Status: DC
  Administered 2016-03-17: 3 ug/kg/min via INTRAVENOUS
  Administered 2016-03-18 (×2): 5.5 ug/kg/min via INTRAVENOUS
  Filled 2016-03-17 (×5): qty 20

## 2016-03-17 MED ORDER — CHLORHEXIDINE GLUCONATE 0.12% ORAL RINSE (MEDLINE KIT)
15.0000 mL | Freq: Two times a day (BID) | OROMUCOSAL | Status: DC
  Administered 2016-03-17 – 2016-03-20 (×6): 15 mL via OROMUCOSAL

## 2016-03-17 MED ORDER — PANTOPRAZOLE SODIUM 40 MG IV SOLR
40.0000 mg | INTRAVENOUS | Status: DC
  Administered 2016-03-17 – 2016-03-21 (×4): 40 mg via INTRAVENOUS
  Filled 2016-03-17 (×4): qty 40

## 2016-03-17 MED ORDER — INSULIN ASPART 100 UNIT/ML ~~LOC~~ SOLN
0.0000 [IU] | SUBCUTANEOUS | Status: DC
  Administered 2016-03-17: 2 [IU] via SUBCUTANEOUS
  Administered 2016-03-17: 3 [IU] via SUBCUTANEOUS
  Administered 2016-03-18 – 2016-03-21 (×8): 2 [IU] via SUBCUTANEOUS
  Administered 2016-03-21: 3 [IU] via SUBCUTANEOUS
  Administered 2016-03-22: 2 [IU] via SUBCUTANEOUS

## 2016-03-17 MED ORDER — FENTANYL BOLUS VIA INFUSION
50.0000 ug | INTRAVENOUS | Status: DC | PRN
  Administered 2016-03-18: 50 ug via INTRAVENOUS
  Filled 2016-03-17: qty 50

## 2016-03-17 MED ORDER — ANTISEPTIC ORAL RINSE SOLUTION (CORINZ)
7.0000 mL | Freq: Four times a day (QID) | OROMUCOSAL | Status: DC
  Administered 2016-03-18 – 2016-03-20 (×10): 7 mL via OROMUCOSAL

## 2016-03-17 MED ORDER — CISATRACURIUM BOLUS VIA INFUSION
2.5000 mg | Freq: Once | INTRAVENOUS | Status: AC
  Administered 2016-03-17: 2.5 mg via INTRAVENOUS
  Filled 2016-03-17: qty 3

## 2016-03-17 MED ORDER — FENTANYL CITRATE (PF) 100 MCG/2ML IJ SOLN: 100.0000 ug | INTRAMUSCULAR | Status: DC | PRN

## 2016-03-17 MED ORDER — FENTANYL CITRATE (PF) 100 MCG/2ML IJ SOLN
INTRAMUSCULAR | Status: AC
  Administered 2016-03-17: 200 ug
  Filled 2016-03-17: qty 4

## 2016-03-17 NOTE — Progress Notes (Signed)
Message left on patients mothers cell phone to please call pts nurse.She has not returned message yet.

## 2016-03-17 NOTE — Progress Notes (Signed)
[facility]                                                                              Patient Demographics  Heidi [name], is a 30 y.o. female, DOB - [birth year], [id number]  Admit date - 03/16/2016   Admitting Physician [name], MD  Outpatient Primary MD for the patient is No primary care provider on file.  LOS - 1   Chief Complaint  Patient presents with  . Asthma      HPI on 03/26/2016 by Dr. Leisa [name] 30 y.o. year old female without significant past medical history, she is a smoker for past 15 years. She presented to [facility] ED with ongoing cough and shortness of breath for past 2 weeks prior to this admission. She tried mucinex but this has not provided significant symptomatic relief. Pt reports shortness of breath initially on exertion but now short of breath even at rest. No reports of fevers. She has chest tightness with cough. No reports of night sweats. No reports of weight loss. No sick contacts recently. No abdominal pain, nausea or vomiting. No lightheadedness or loss of consciousness. No reports of blood in stool or urine.   In ED, pt is hemodynamically stable. Oxygen saturation is 86% on room air. This has improve dto 100% with [place] oxygen support. Blood work showed mild leukocytosis of 13.7, normal lactic acid. There is concern for bilateral pneumonia and possible malignancy. CT scan also showed significant lymphadenopathy in the mediastinum, hilar area, right axillary area, upper abdomen. Possible lung cancer as primary malignancy or lymphoma. Started on broad spectrum abx and pulmonary consulted as well for possible need for bronchoscopy.  Interim history Overnight, patient became very restless and felt very short of breath, tachycardic and tachypneic with increased work of breathing. She was noted to be hypoxic. Patient was placed on BiPAP and seemed to don better. Upon examination this morning, patient had more work of breathing. BiPAP again was attempted  however patient did not tolerate. [facility] called, and at bedside to intubate patient.   Assessment & Plan   Acute respiratory failure with hypoxia/sepsis due to pneumonitis vs post obstructive pneumonia -Patient noted to have consolidation in the left and right lung fields -Upon admission patient was tachycardic, tachypneic, with leukocytosis and hypoxia -Continue vancomycin and cefepime -Blood, respiratory cultures pending -Continue neb treatments, Solu-Medrol -Due to increased work of breathing, patient currently being intubated -PCCM consulted and appreciated. -Influenza A PCR negative, HIV nonreactive -Strep pneumonia urine antigen negative  Lung mass/lymphadenopathy -CT scan showed extensive adenopathy throughout the hila, mediastinum and right axillary region, upper abdominal retroperitoneal regions. Neoplastic etiology likely. Widespread pneumonitis bilaterally. Obstruction of the left upper lobe bronchus, likely due to tumor with postobstructive pneumonitis. Collapse of the right lower lobe without well defined and bronchial lesion. -Interventional radiology consulted, status post right axillary node biopsy, results pending -? Malignancy versus pneumonitis versus sarcoidosis -May need oncology consult when biopsy results are back  Code Status: Full  Family Communication: None at bedside.  VM left for mother.  Disposition Plan: Admitted. Currently being intubated  Time Spent in minutes   30 minutes  Procedures  Intubation S/p Right axillary  lymph node biopsy  Consults   Interventional radiology PCCM  DVT Prophylaxis  SCDs  Lab Results  Component Value Date   PLT 606* 03/17/2016    Medications  Scheduled Meds: . budesonide (PULMICORT) nebulizer solution  0.5 mg Nebulization BID  . ceFEPime (MAXIPIME) IV  1 g Intravenous 3 times per day  . ipratropium  0.5 mg Nebulization Q4H  . levalbuterol  1.25 mg Nebulization Q4H  . methylPREDNISolone (SOLU-MEDROL) injection   60 mg Intravenous Q12H  . propofol      . sodium chloride flush  3 mL Intravenous Q12H  . vancomycin  750 mg Intravenous Q8H   Continuous Infusions: . sodium chloride 10 mL/hr at 03/16/16 2300   PRN Meds:.acetaminophen **OR** acetaminophen, diphenhydrAMINE, levalbuterol, ondansetron **OR** ondansetron (ZOFRAN) IV  Antibiotics    Anti-infectives    Start     Dose/Rate Route Frequency Ordered Stop   03/16/16 1800  vancomycin (VANCOCIN) IVPB 750 mg/150 ml premix     750 mg 150 mL/hr over 60 Minutes Intravenous Every 8 hours 03/16/16 1244 03/24/16 1759   03/16/16 1400  ceFEPIme (MAXIPIME) 1 g in dextrose 5 % 50 mL IVPB     1 g 100 mL/hr over 30 Minutes Intravenous 3 times per day 03/16/16 1204 03/24/16 1359   03/16/16 0915  cefTRIAXone (ROCEPHIN) 1 g in dextrose 5 % 50 mL IVPB     1 g 100 mL/hr over 30 Minutes Intravenous  Once 03/16/16 0903 03/16/16 0954   03/16/16 0915  azithromycin (ZITHROMAX) 500 mg in dextrose 5 % 250 mL IVPB     500 mg 250 mL/hr over 60 Minutes Intravenous  Once 03/16/16 [id number] 03/16/16 1050     Subjective:   Heidi [name] seen and examined today.  Patient feels breathing is about the same. Patient states she is tired. Denies any chest pain, abdominal pain, nausea, vomiting, headache or dizziness.  Objective:   Filed Vitals:   03/17/16 0700 03/17/16 0800 03/17/16 0857 03/17/16 1120  BP: 140/100 140/100    Pulse: 54  121   Temp:  98.3 F (36.8 C)    TempSrc:  Oral    Resp: 28 28 24    Height:    5\' 6"  (1.676 m)  Weight:      SpO2: 92% 93% 93%     Wt Readings from Last 3 Encounters:  03/17/16 61.4 kg (135 lb 5.8 oz)  05/15/14 69.718 kg (153 lb 11.2 oz)  03/11/14 71.668 kg (158 lb)     Intake/Output Summary (Last 24 hours) at 03/17/16 1124 Last data filed at 03/17/16 0927  Gross per 24 hour  Intake   1120 ml  Output   1500 ml  Net   -380 ml    Exam  General: Well developed, well nourished, mild respiratory distress  HEENT: NCAT,mucous  membranes moist.   Cardiovascular: S1 S2 auscultated, no rubs, murmurs or gallops. tachycardic  Respiratory: Increased work of breathing, diffuse rhonchi  Abdomen: Soft, nontender, nondistended, + bowel sounds  Extremities: warm dry without cyanosis clubbing or edema  Neuro: AAOx3, nonfocal  Psych: Normal affect and demeanor   Data Review   Micro Results Recent Results (from the past 240 hour(s))  MRSA PCR Screening     Status: None   Collection Time: 03/16/16  6:23 PM  Result Value Ref Range Status   MRSA by PCR NEGATIVE NEGATIVE Final    Comment:        The GeneXpert MRSA Assay (FDA approved for NASAL specimens  only), is one component of a comprehensive MRSA colonization surveillance program. It is not intended to diagnose MRSA infection nor to guide or monitor treatment for MRSA infections.     Radiology Reports Dg Chest 2 View  03/16/2016  CLINICAL DATA:  Chest pain, shortness of breath, coughing for a couple of weeks, smoker for 15 years, asthma EXAM: CHEST  2 VIEW COMPARISON:  None. FINDINGS: Borderline cardiomegaly. There is extensive consolidation left upper lobe hilar region and perihilar region. Mass or adenopathy cannot be excluded Patchy consolidation is noted in right lower lobe. Although findings may be due to bilateral pneumonia neoplastic process cannot be excluded. Further correlation with enhanced CT scan of the chest is recommended. IMPRESSION: There is extensive consolidation left upper lobe hilar region and perihilar region. Mass or adenopathy cannot be excluded. Patchy consolidation is noted in right lower lobe. Although findings may be due to bilateral pneumonia neoplastic process cannot be excluded. Further correlation with enhanced CT scan of the chest is recommended. These results were called by telephone at the time of interpretation on 03/16/2016 at 8:58 am to Dr. Charlann Lange , who verbally acknowledged these results. Electronically Signed   By: Lahoma Crocker M.D.   On: 03/16/2016 08:59   Ct Chest W Contrast  03/16/2016  CLINICAL DATA:  Shortness of Breath EXAM: CT CHEST WITH CONTRAST TECHNIQUE: Multidetector CT imaging of the chest was performed during intravenous contrast administration. CONTRAST:  72mL OMNIPAQUE IOHEXOL 300 MG/ML  SOLN COMPARISON:  Chest radiograph March 16, 2016 FINDINGS: Mediastinum/Lymph Nodes: Thyroid appears unremarkable. There is widespread adenopathy. There are enlarged lymph nodes throughout the right axilla with the largest individual lymph node in the right axilla measuring 3.4 x 2.6 cm. There is extensive anterior mediastinal adenopathy. The largest individual lymph node in the anterior mediastinum measures 2.3 x 1.5 cm. There are multiple right peritracheal lymph nodes with the largest individual lymph node in the right peritracheal region 9 measuring 2.0 x 1.5 cm. There are lymph nodes tracking along the left-side of the aortic arch. A lymph node in the aortopulmonary window region measures 2.0 x 1.3 cm. There are lymph nodes in each hilar region. The largest lymph node in the right hilum measures 2.3 x 1.4 cm. Lymph nodes in the left hilum appear matched Ed there are difficult to distinguish from 1 another. There is a lymph node in the inferior left hilum measuring 1.6 x 1.3 cm. There is sub- carinal adenopathy with the largest sub- carinal lymph node measuring 2.2 x 1.8 cm. There is no thoracic aortic aneurysm or dissection. The visualized great vessels appear unremarkable. There is no major vessel pulmonary embolus evident. There is a rather minimal pericardial effusion. Lungs/Pleura: There is consolidation throughout essentially the entire left upper lobe. There is abrupt termination of the proximal left upper lobe bronchus, likely due to an endobronchial lesion in the left upper lobe with postobstructive pneumonitis. There is widespread airspace consolidation throughout the right upper lobe. There is consolidation of most of  the right lower lobe with narrowing of the right lower lobe bronchus. An endobronchial lesion on the right is not demonstrated, although there may well be neoplasm in the right lower lobe with surrounding pneumonitis. A lesser degree of infiltrate is noted in the right middle lobe. Patchy pneumonia is also noted in the right lower lobe, primarily in the posterior segment region. Lingula is collapsed along with the remainder of the left upper lobe. There is no appreciable pleural effusion. Upper abdomen:  In the visualized upper abdomen, there is extensive retroperitoneal adenopathy. The largest individual lymph node visualized is located between the aorta and left kidney measuring 2.1 x 1.3 cm. A second lymph node between the aorta and inferior vena cava measures 2.0 x 1.2 cm. Adrenals appear unremarkable. Visualized upper abdominal structures otherwise appear unremarkable. Musculoskeletal: No blastic or lytic bone lesions are evident. IMPRESSION: Extensive adenopathy throughout the hila, mediastinum, and right axillary region as well as in the visualized upper abdominal retroperitoneal regions. 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 Widespread pneumonitis bilaterally. There is obstruction of the left upper lobe bronchus, likely due to tumor with postobstructive pneumonitis. There is collapse of the right lower lobe without well-defined endobronchial lesion. More patchy infiltrate is noted elsewhere as noted above. Given the findings in the lungs, bronchoscopy could be most helpful to assess degree of potential tumor versus pneumonitis. No adrenal lesions evident. Electronically Signed   By: Lowella Grip III M.D.   On: 03/16/2016 11:19   US Biopsy  03/16/2016  INDICATION: 30 year old female with new diagnosis of extensive adenopathy concerning for lymphoma. EXAM: ULTRASOUND BIOPSY CORE MEDICATIONS: None. ANESTHESIA/SEDATION: None FLUOROSCOPY TIME:  None COMPLICATIONS: None immediate. Estimated blood loss: 0  PROCEDURE: Informed written consent was obtained from the patient after a thorough discussion of the procedural risks, benefits and alternatives. All questions were addressed. A timeout was performed prior to the initiation of the procedure. The right axilla was interrogated with ultrasound. Large hypoechoic lymph nodes are identified. A suitable skin entry site was selected and marked. Local anesthesia was attained by infiltration with 1% lidocaine. A small dermatotomy was made. Under real-time sonographic guidance, multiple 16 gauge core biopsies were obtained using a Bard automated biopsy device. Biopsy specimens were placed in saline and delivered to pathology for further analysis. Post biopsy ultrasound imaging demonstrates no evidence of bleeding or complication. IMPRESSION: Technically successful ultrasound-guided core biopsy of right axillary adenopathy. Signed, Criselda Peaches, MD [facility]iology Electronically Signed   By: Jacqulynn Cadet M.D.   On: 03/16/2016 16:28   Dg Chest Port 1 View  03/17/2016  CLINICAL DATA:  Acute respiratory failure. Lung consolidation. Extensive adenopathy. EXAM: PORTABLE CHEST 1 VIEW COMPARISON:  Chest x-ray and CT scan dated 03/16/2016 FINDINGS: Extensive bilateral lung consolidation/infiltrates persist. Increased density at the left lung apex is felt to represent loculated pleural fluid. Heart size and pulmonary vascularity are normal. Stable slight thoracic scoliosis. IMPRESSION: Persistent extensive bilateral lung consolidation/pulmonary infiltrates. This probably represents a combination of tumor infiltration and pneumonia. New small loculated effusion at the left apex. Electronically Signed   By: Lorriane Shire M.D.   On: 03/17/2016 11:00    CBC  Recent Labs Lab 03/16/16 0915 03/16/16 2003 03/17/16 0305  WBC 13.7* 13.7* 18.6*  HGB 12.4 11.3* 10.9*  HCT 38.1 34.6* 34.7*  PLT 593* 593* 606*  MCV  92.9 89.6 93.8  MCH 30.2 29.3 29.5  MCHC 32.5 32.7 31.4  RDW 14.1 14.2 14.3  LYMPHSABS 0.4* 0.3*  --   MONOABS 0.3 0.4  --   EOSABS 0.0 0.0  --   BASOSABS 0.0 0.0  --     Chemistries   Recent Labs Lab 03/16/16 0915 03/16/16 2003 03/17/16 0305  NA 134* 136 136  K 3.7 4.2 4.3  CL 98* 99* 101  CO2 27 28 27   GLUCOSE 175* 129* 150*  BUN 6 7 6   CREATININE 0.67 0.59 0.62  CALCIUM 8.2* 8.4* 8.5*  MG 2.2 2.3  2.2   ------------------------------------------------------------------------------------------------------------------ estimated creatinine clearance is 96.3 mL/min (by C-G formula based on Cr of 0.62). ------------------------------------------------------------------------------------------------------------------ No results for input(s): HGBA1C in the last 72 hours. ------------------------------------------------------------------------------------------------------------------ No results for input(s): CHOL, HDL, LDLCALC, TRIG, CHOLHDL, LDLDIRECT in the last 72 hours. ------------------------------------------------------------------------------------------------------------------  Recent Labs  03/16/16 2003  TSH 0.486   ------------------------------------------------------------------------------------------------------------------ No results for input(s): VITAMINB12, FOLATE, FERRITIN, TIBC, IRON, RETICCTPCT in the last 72 hours.  Coagulation profile  Recent Labs Lab 03/16/16 2003  INR 1.21    No results for input(s): DDIMER in the last 72 hours.  Cardiac Enzymes No results for input(s): CKMB, TROPONINI, MYOGLOBIN in the last 168 hours.  Invalid input(s): CK ------------------------------------------------------------------------------------------------------------------ Invalid input(s): POCBNP    Raffi Milstein D.O. on 03/17/2016 at 11:24 AM  Between 7am to 7pm - Pager - [phone]  After 7pm go to [email or web address] - password [email or web address]  And look for  the night coverage person covering for me after hours  [facility] Office  [phone]

## 2016-03-17 NOTE — Progress Notes (Signed)
Name: Heidi [name] MRN: [id number] DOB: 07-14-1986    ADMISSION DATE:  03/16/2016 CONSULTATION DATE:  3/21  REFERRING MD :  Charlies Silvers   CHIEF COMPLAINT:  Abnormal CT scan   BRIEF PATIENT DESCRIPTION:  This is a 30 year old female w/ no sig medical history. Presented to the ED on w/ cc: cough and resting dyspnea. CT chest obtained showing extensive LUL/RLL consolidative PNA w/ diffuse LAN. PCCM consulted w/ concern for malignancy.   SIGNIFICANT EVENTS    STUDIES:  CT chest 3/21: Extensive adenopathy throughout the hila, mediastinum, and right axillary region as well as in the visualized upper abdominal retroperitoneal regions. Neoplastic etiology highly likely.Widespread pneumonitis bilaterally. There is obstruction of the left upper lobe bronchus, likely due to tumor with postobstructive pneumonitis. There is collapse of the right lower lobe without well-defined endobronchial lesion. More patchy infiltrate is noted elsewhere as noted above. Given the findings in the lungs, bronchoscopy could be most helpful to assess degree of potential tumor versus pneumonitis.No adrenal lesions evident.   HISTORY OF PRESENT ILLNESS:  This is a 29 year old female w/ no sig medical history. Presented to the ED on w/ cc: cough and resting dyspnea. Symptoms started about 2 weeks prior to presentation. Initially w/ nasal congestion and then productive cough w/ green sputum. The cough continued, this was associated w/ increased shortness of breath for which she presented to the ED on 3/21. She denied fever, did have chills. Denied sick exposure, night sweats, sore throat, did have some chest discomfort w/ cough. On presentation was tachycardic w/ increased RR.  CT chest obtained showing extensive LUL/RLL consolidative PNA w/ diffuse LAN. She was placed on broad spec abx and to be admitted by the IM service. PCCM consulted w/ concern for malignancy.    SUBJECTIVE:  No distress.   VITAL SIGNS: Temp:   [98 F (36.7 C)-98.5 F (36.9 C)] 98.2 F (36.8 C) (03/22 0444) Pulse Rate:  [54-130] 121 (03/22 0857) Resp:  [15-39] 24 (03/22 0857) BP: (131-148)/(67-100) 140/100 mmHg (03/22 0800) SpO2:  [66 %-100 %] 93 % (03/22 0857) Weight:  [132 lb 11.5 oz (60.2 kg)-155 lb (70.308 kg)] 135 lb 5.8 oz (61.4 kg) (03/22 0500)  PHYSICAL EXAMINATION: General:  Acutely ill appearing aaf, not in acute distress, but very short of breath w/ sig cough, reports doing better Neuro:  Awake, oriented. No focal def.  HEENT:  NCAT, some painful adenopathy right neck  Cardiovascular:  Tachy rrr no MRG Lungs:  Diffuse rhonchi, decreased on right base.  Abdomen:  Soft, not tender + bowel sounds  Musculoskeletal:  Intact, good strength and bulk  Skin:  Dry, warm, intact    Recent Labs Lab 03/16/16 0915 03/16/16 2003 03/17/16 0305  NA 134* 136 136  K 3.7 4.2 4.3  CL 98* 99* 101  CO2 27 28 27   BUN 6 7 6   CREATININE 0.67 0.59 0.62  GLUCOSE 175* 129* 150*    Recent Labs Lab 03/16/16 0915 03/16/16 2003 03/17/16 0305  HGB 12.4 11.3* 10.9*  HCT 38.1 34.6* 34.7*  WBC 13.7* 13.7* 18.6*  PLT 593* 593* 606*   Dg Chest 2 View  03/16/2016  CLINICAL DATA:  Chest pain, shortness of breath, coughing for a couple of weeks, smoker for 15 years, asthma EXAM: CHEST  2 VIEW COMPARISON:  None. FINDINGS: Borderline cardiomegaly. There is extensive consolidation left upper lobe hilar region and perihilar region. Mass or adenopathy cannot be excluded Patchy consolidation is noted in right  lower lobe. Although findings may be due to bilateral pneumonia neoplastic process cannot be excluded. Further correlation with enhanced CT scan of the chest is recommended. IMPRESSION: There is extensive consolidation left upper lobe hilar region and perihilar region. Mass or adenopathy cannot be excluded. Patchy consolidation is noted in right lower lobe. Although findings may be due to bilateral pneumonia neoplastic process cannot be  excluded. Further correlation with enhanced CT scan of the chest is recommended. These results were called by telephone at the time of interpretation on 03/16/2016 at 8:58 am to Dr. Charlann Lange , who verbally acknowledged these results. Electronically Signed   By: Lahoma Crocker M.D.   On: 03/16/2016 08:59   Ct Chest W Contrast  03/16/2016  CLINICAL DATA:  Shortness of Breath EXAM: CT CHEST WITH CONTRAST TECHNIQUE: Multidetector CT imaging of the chest was performed during intravenous contrast administration. CONTRAST:  75mL OMNIPAQUE IOHEXOL 300 MG/ML  SOLN COMPARISON:  Chest radiograph March 16, 2016 FINDINGS: Mediastinum/Lymph Nodes: Thyroid appears unremarkable. There is widespread adenopathy. There are enlarged lymph nodes throughout the right axilla with the largest individual lymph node in the right axilla measuring 3.4 x 2.6 cm. There is extensive anterior mediastinal adenopathy. The largest individual lymph node in the anterior mediastinum measures 2.3 x 1.5 cm. There are multiple right peritracheal lymph nodes with the largest individual lymph node in the right peritracheal region 9 measuring 2.0 x 1.5 cm. There are lymph nodes tracking along the left-side of the aortic arch. A lymph node in the aortopulmonary window region measures 2.0 x 1.3 cm. There are lymph nodes in each hilar region. The largest lymph node in the right hilum measures 2.3 x 1.4 cm. Lymph nodes in the left hilum appear matched Ed there are difficult to distinguish from 1 another. There is a lymph node in the inferior left hilum measuring 1.6 x 1.3 cm. There is sub- carinal adenopathy with the largest sub- carinal lymph node measuring 2.2 x 1.8 cm. There is no thoracic aortic aneurysm or dissection. The visualized great vessels appear unremarkable. There is no major vessel pulmonary embolus evident. There is a rather minimal pericardial effusion. Lungs/Pleura: There is consolidation throughout essentially the entire left upper lobe.  There is abrupt termination of the proximal left upper lobe bronchus, likely due to an endobronchial lesion in the left upper lobe with postobstructive pneumonitis. There is widespread airspace consolidation throughout the right upper lobe. There is consolidation of most of the right lower lobe with narrowing of the right lower lobe bronchus. An endobronchial lesion on the right is not demonstrated, although there may well be neoplasm in the right lower lobe with surrounding pneumonitis. A lesser degree of infiltrate is noted in the right middle lobe. Patchy pneumonia is also noted in the right lower lobe, primarily in the posterior segment region. Lingula is collapsed along with the remainder of the left upper lobe. There is no appreciable pleural effusion. Upper abdomen: In the visualized upper abdomen, there is extensive retroperitoneal adenopathy. The largest individual lymph node visualized is located between the aorta and left kidney measuring 2.1 x 1.3 cm. A second lymph node between the aorta and inferior vena cava measures 2.0 x 1.2 cm. Adrenals appear unremarkable. Visualized upper abdominal structures otherwise appear unremarkable. Musculoskeletal: No blastic or lytic bone lesions are evident. IMPRESSION: Extensive adenopathy throughout the hila, mediastinum, and right axillary region as well as in the visualized upper abdominal retroperitoneal regions. 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 Widespread pneumonitis bilaterally. There is obstruction  of the left upper lobe bronchus, likely due to tumor with postobstructive pneumonitis. There is collapse of the right lower lobe without well-defined endobronchial lesion. More patchy infiltrate is noted elsewhere as noted above. Given the findings in the lungs, bronchoscopy could be most helpful to assess degree of potential tumor versus pneumonitis. No adrenal lesions evident. Electronically Signed   By: Lowella Grip III M.D.   On: 03/16/2016 11:19   US  Biopsy  03/16/2016  INDICATION: 30 year old female with new diagnosis of extensive adenopathy concerning for lymphoma. EXAM: ULTRASOUND BIOPSY CORE MEDICATIONS: None. ANESTHESIA/SEDATION: None FLUOROSCOPY TIME:  None COMPLICATIONS: None immediate. Estimated blood loss: 0 PROCEDURE: Informed written consent was obtained from the patient after a thorough discussion of the procedural risks, benefits and alternatives. All questions were addressed. A timeout was performed prior to the initiation of the procedure. The right axilla was interrogated with ultrasound. Large hypoechoic lymph nodes are identified. A suitable skin entry site was selected and marked. Local anesthesia was attained by infiltration with 1% lidocaine. A small dermatotomy was made. Under real-time sonographic guidance, multiple 16 gauge core biopsies were obtained using a Bard automated biopsy device. Biopsy specimens were placed in saline and delivered to pathology for further analysis. Post biopsy ultrasound imaging demonstrates no evidence of bleeding or complication. IMPRESSION: Technically successful ultrasound-guided core biopsy of right axillary adenopathy. Signed, Criselda Peaches, MD [facility] Electronically Signed   By: Jacqulynn Cadet M.D.   On: 03/16/2016 16:28    ASSESSMENT / PLAN: Acute hypoxic respiratory failure  Multi-focal Pneumonia  R/o lung mass w/ post-obstructive process, IR bx on 3/21 Wide spread LAN H/o asthma   Plan Cont broad spec abx Agree w/ U strep and legionella antigen, influenza pcr  Send Sputum PCR  Scheduled BDs Ok to cont systemic steroids Agree w/ hiv Follow IR bx results Will ultimately need FOB to eval for endobronchial lesion even if we get dx from bx (scheduled for Friday at 0900) Admitted to [place] Pager [phone] till 3 pm If no answer page [phone] 03/17/2016, 9:09 AM    03/17/2016, 9:09  AM

## 2016-03-17 NOTE — Anesthesia Procedure Notes (Signed)
Procedure Name: Intubation Date/Time: 03/17/2016 11:06 AM Performed by: Maxwell Caul Pre-anesthesia Checklist: Patient identified, Emergency Drugs available, Suction available and Patient being monitored Patient Re-evaluated:Patient Re-evaluated prior to inductionOxygen Delivery Method: Circle System Utilized Preoxygenation: Pre-oxygenation with 100% oxygen Intubation Type: IV induction Ventilation: Mask ventilation without difficulty Laryngoscope Size: Glidescope and 3 Grade View: Grade I Tube type: Subglottic suction tube Tube size: 7.5 mm Number of attempts: 1 Airway Equipment and Method: Stylet Placement Confirmation: ETT inserted through vocal cords under direct vision,  positive ETCO2 and breath sounds checked- equal and bilateral ETT to lip (cm): Per MD at bedside. Tube secured with: Tape Dental Injury: Teeth and Oropharynx as per pre-operative assessment  Difficulty Due To: Difficulty was anticipated, Difficult Airway- due to anterior larynx and Difficult Airway- due to limited oral opening

## 2016-03-17 NOTE — Progress Notes (Signed)
20mg  of Etomidate and 60mg  of Succinylcholine used for intubation per Dr. Corrie Dandy at bedside.

## 2016-03-17 NOTE — Progress Notes (Signed)
Initial Nutrition Assessment  DOCUMENTATION CODES:   Not applicable  INTERVENTION:  -RD to monitor for needs  NUTRITION DIAGNOSIS:   Inadequate oral intake related to inability to eat as evidenced by NPO status.  GOAL:   Patient will meet greater than or equal to 90% of their needs  MONITOR:   I & O's, Labs, Vent status, Weight trends  REASON FOR ASSESSMENT:   Ventilator    ASSESSMENT:   30 y.o. year old female without significant past medical history, she is a smoker for past 15 years. She presented to [facility] ED with ongoing cough and shortness of breath for past 2 weeks prior to this admission. She tried mucinex but this has not provided significant symptomatic relief  Patient is currently intubated on ventilator support MV: 5.2 L/min Temp (24hrs), Avg:98.4 F (36.9 C), Min:98 F (36.7 C), Max:99.4 F (37.4 C)  Propofol: 16.6 ml/hr  Presented to ED on BIPAP but did not tolerate and was intubated r/t respiratory distress.  No family at bedside to provide hx. Per MD note, pt suffering from Sepsis due to pneumonitis vs post obstructive pneumonia. Influenza A negative. Extensive adenopathy noted, possible Lung mass.  RD will continue to follow for TF initiation.  Labs: Unremarkable Medications: Solumedrol, Propofol drip; Fentanyl Drip  Diet Order:  Diet NPO time specified  Skin:  Reviewed, no issues  Last BM:  PTA  Height:   Ht Readings from Last 1 Encounters:  03/17/16 5\' 6"  (1.676 m)    Weight:   Wt Readings from Last 1 Encounters:  03/17/16 135 lb 5.8 oz (61.4 kg)    Ideal Body Weight:  59.09 kg  BMI:  Body mass index is 21.86 kg/(m^2).  Estimated Nutritional Needs:   Kcal:  1841  Protein:  73-92 grams  Fluid:  >/= 1.8L  EDUCATION NEEDS:   No education needs identified at this time  Satira Anis. Taj Arteaga, MS, RD LDN After Hours/Weekend Pager [phone]

## 2016-03-17 NOTE — Progress Notes (Signed)
eLink Physician-Brief Progress Note Patient Name: Heidi [name] DOB: 08-Sep-1986 MRN: [id number]   Date of Service  03/17/2016  HPI/Events of Note  Camera check on patient post intubation. ABG 7.129/104/72 on PRVC wwith PEEP of 14 FiO2 1.0 & respiratory rate 18. Patient sedated. Paralytic ordered. Plateau pressures 35 & peak pressure 37-38.  eICU Interventions  1. Increase ventilator rate to 26 breaths per minute 2. Continue current ventilator settings weaning FiO2 only for saturation greater than 94% 3. Continue current plan of care 4. ABG at 1630 hrs.     Intervention Category Major Interventions: Respiratory failure - evaluation and management  [name] 03/17/2016, 3:36 PM

## 2016-03-17 NOTE — Progress Notes (Signed)
Pharmacy Antibiotic Follow-up Note  Heidi [name] is a 30 y.o. year-old female admitted on 03/16/2016 with PNA.  Pharmacy consulted to start Vancomycin and Cefepime.   Renal fxn stable.  WBC rising.  No culture data yet.   Assessment/Plan: Vancomycin 750mg  IV every 8 hours.  Goal trough 15-20 mcg/mL.  -Check VT tonight.   Continue Cefepime 1g q8hr  Temp (24hrs), Avg:98.4 F (36.9 C), Min:98 F (36.7 C), Max:99.4 F (37.4 C)   Recent Labs Lab 03/16/16 0915 03/16/16 2003 03/17/16 0305  WBC 13.7* 13.7* 18.6*     Recent Labs Lab 03/16/16 0915 03/16/16 2003 03/17/16 0305  CREATININE 0.67 0.59 0.62   Estimated Creatinine Clearance: 96.3 mL/min (by C-G formula based on Cr of 0.62).    No Known Allergies  Antimicrobials this admission: 3/21 Rocephin & Azithromcyin x1 in ED  3/21 Vancomycin >>  3/21 Cefepime >>  Levels/dose changes this admission:  Microbiology results: 3/21 BCx: sent 3/21 Influenza panel: ordered 3/21 Strep pneumo/Legionella: IP 3/21 SputumCx: ordered 3/21 MRSA PCR negative  Thank you for allowing pharmacy to be a part of this patient's care.  Ralene Bathe, PharmD, BCPS 03/17/2016, 12:34 PM  Pager: [phone]

## 2016-03-17 NOTE — Progress Notes (Addendum)
RN paged this NP secondary to pt being restless, tachycardic, and tachypneic with O2 sat 90%. Slight increased WOB. O2 bumped to high flow at 8L by RT. NP ordered ABG and went to bedside.  S: pt says she is restless and can not sleep. Feels SOB "on and off" but a little better since O2 bumped up. No chest pain.  O: Acutely ill appearing young AAF in moderate respiratory distress. Alert, oriented. Very restless. O2 sat 88-91% with RR in the mid to high 20s. Lungs very rhonchus. RRR.  A/P: 1. Hypoxia with respiratory failure secondary to PNA and lung mass (?carcinoma). Acute worsening. ABG showed normal pH, PO2 of 65, and PCO2 of 47. O2 bumped to high flow at 12L. Stayed on unit for awhile to keep a check on pt. After O2 bumped, she was more calm and satting 92-94%. RR slowed to low 20s.  Pulmonary consulted on pt today and noted low threshold to intubate pt. This NP called Elink and discussed pt with Dr. Oletta Darter. He OK'd the use of Bipap in spite of the lung mass after reviewing the CT. Will watch closely for now. RN knows to call for hypoxia, more restlessness, SOB or increased WOB. Try Bipap next as needed and intubate if necessary.  KJKG, NP [facility] Update: Since above, has been resting quietly without hypoxia or tachypnea.  KJKG, NP

## 2016-03-17 NOTE — Progress Notes (Signed)
Patient did not tolerate BiPAP and was intubated due to resp. Distress. Patient tolerated intubation fairly well due to quick desaturations when removed nasal cannula to BVM. Patient currently on settings PRVC 360/18/+12/100%. RN at bedside with patient.

## 2016-03-18 ENCOUNTER — Inpatient Hospital Stay (HOSPITAL_COMMUNITY): Payer: Self-pay

## 2016-03-18 DIAGNOSIS — J80 Acute respiratory distress syndrome: Secondary | ICD-10-CM

## 2016-03-18 LAB — GLUCOSE, CAPILLARY
GLUCOSE-CAPILLARY: 103 mg/dL — AB (ref 65–99)
GLUCOSE-CAPILLARY: 124 mg/dL — AB (ref 65–99)
Glucose-Capillary: 117 mg/dL — ABNORMAL HIGH (ref 65–99)
Glucose-Capillary: 121 mg/dL — ABNORMAL HIGH (ref 65–99)
Glucose-Capillary: 125 mg/dL — ABNORMAL HIGH (ref 65–99)
Glucose-Capillary: 99 mg/dL (ref 65–99)

## 2016-03-18 LAB — BLOOD GAS, ARTERIAL
ACID-BASE EXCESS: 6.3 mmol/L — AB (ref 0.0–2.0)
BICARBONATE: 32.3 meq/L — AB (ref 20.0–24.0)
DRAWN BY: 308601
FIO2: 0.6
O2 SAT: 98.8 %
PEEP/CPAP: 14 cmH2O
PH ART: 7.38 (ref 7.350–7.450)
Patient temperature: 36.7
RATE: 26 resp/min
TCO2: 29.8 mmol/L (ref 0–100)
VT: 360 mL
pCO2 arterial: 55.7 mmHg — ABNORMAL HIGH (ref 35.0–45.0)
pO2, Arterial: 136 mmHg — ABNORMAL HIGH (ref 80.0–100.0)

## 2016-03-18 LAB — BASIC METABOLIC PANEL
Anion gap: 8 (ref 5–15)
BUN: 9 mg/dL (ref 6–20)
CALCIUM: 8.5 mg/dL — AB (ref 8.9–10.3)
CO2: 30 mmol/L (ref 22–32)
CREATININE: 0.56 mg/dL (ref 0.44–1.00)
Chloride: 103 mmol/L (ref 101–111)
GFR calc non Af Amer: >60 mL/min (ref >60)
Glucose, Bld: 121 mg/dL — ABNORMAL HIGH (ref 65–99)
Potassium: 5.5 mmol/L — ABNORMAL HIGH (ref 3.5–5.1)
SODIUM: 141 mmol/L (ref 135–145)

## 2016-03-18 LAB — PREGNANCY, URINE: Preg Test, Ur: NEGATIVE

## 2016-03-18 LAB — HEPATIC FUNCTION PANEL
ALBUMIN: 2.1 g/dL — AB (ref 3.5–5.0)
ALK PHOS: 57 U/L (ref 38–126)
ALT: 15 U/L (ref 14–54)
AST: 13 U/L — ABNORMAL LOW (ref 15–41)
BILIRUBIN TOTAL: 0.5 mg/dL (ref 0.3–1.2)
Bilirubin, Direct: <0.1 mg/dL — ABNORMAL LOW (ref 0.1–0.5)
TOTAL PROTEIN: 4.9 g/dL — AB (ref 6.5–8.1)

## 2016-03-18 LAB — BASIC METABOLIC PANEL WITH GFR
Anion gap: 6 (ref 5–15)
BUN: 7 mg/dL (ref 6–20)
CO2: 30 mmol/L (ref 22–32)
Calcium: 8.5 mg/dL — ABNORMAL LOW (ref 8.9–10.3)
Chloride: 103 mmol/L (ref 101–111)
Creatinine, Ser: 0.53 mg/dL (ref 0.44–1.00)
GFR calc Af Amer: >60 mL/min
GFR calc non Af Amer: >60 mL/min
Glucose, Bld: 126 mg/dL — ABNORMAL HIGH (ref 65–99)
Potassium: 5.4 mmol/L — ABNORMAL HIGH (ref 3.5–5.1)
Sodium: 139 mmol/L (ref 135–145)

## 2016-03-18 LAB — CBC
HEMATOCRIT: 33.5 % — AB (ref 36.0–46.0)
HEMOGLOBIN: 10.5 g/dL — AB (ref 12.0–15.0)
MCH: 30.4 pg (ref 26.0–34.0)
MCHC: 31.3 g/dL (ref 30.0–36.0)
MCV: 97.1 fL (ref 78.0–100.0)
Platelets: 532 10*3/uL — ABNORMAL HIGH (ref 150–400)
RBC: 3.45 MIL/uL — ABNORMAL LOW (ref 3.87–5.11)
RDW: 14.4 % (ref 11.5–15.5)
WBC: 20.2 10*3/uL — ABNORMAL HIGH (ref 4.0–10.5)

## 2016-03-18 LAB — ECHOCARDIOGRAM COMPLETE
HEIGHTINCHES: 66 in
WEIGHTICAEL: 2183.44 [oz_av]

## 2016-03-18 LAB — MAGNESIUM: MAGNESIUM: 2.2 mg/dL (ref 1.7–2.4)

## 2016-03-18 LAB — LEGIONELLA PNEUMOPHILA SEROGP 1 UR AG: L. pneumophila Serogp 1 Ur Ag: NEGATIVE

## 2016-03-18 LAB — PHOSPHORUS: Phosphorus: 4.2 mg/dL (ref 2.5–4.6)

## 2016-03-18 LAB — VANCOMYCIN, TROUGH: Vancomycin Tr: 7 ug/mL — ABNORMAL LOW (ref 10.0–20.0)

## 2016-03-18 MED ORDER — VITAMINS A & D EX OINT
TOPICAL_OINTMENT | CUTANEOUS | Status: AC
  Administered 2016-03-19: 1
  Filled 2016-03-18: qty 5

## 2016-03-18 MED ORDER — FREE WATER
200.0000 mL | Freq: Four times a day (QID) | Status: DC
  Administered 2016-03-18 – 2016-03-19 (×3): 200 mL

## 2016-03-18 MED ORDER — FREE WATER: 100.0000 mL | Freq: Four times a day (QID) | Status: DC

## 2016-03-18 MED ORDER — VANCOMYCIN HCL 10 G IV SOLR
1250.0000 mg | Freq: Three times a day (TID) | INTRAVENOUS | Status: DC
  Administered 2016-03-18 – 2016-03-19 (×4): 1250 mg via INTRAVENOUS
  Filled 2016-03-18 (×5): qty 1250

## 2016-03-18 MED ORDER — HEPARIN SODIUM (PORCINE) 5000 UNIT/ML IJ SOLN
5000.0000 [IU] | Freq: Three times a day (TID) | INTRAMUSCULAR | Status: DC
  Administered 2016-03-18 – 2016-03-20 (×6): 5000 [IU] via SUBCUTANEOUS
  Filled 2016-03-18 (×6): qty 1

## 2016-03-18 MED ORDER — VITAL HIGH PROTEIN PO LIQD
1000.0000 mL | ORAL | Status: DC
  Administered 2016-03-18: 1000 mL
  Administered 2016-03-19: 23:00:00
  Administered 2016-03-19: 1000 mL
  Administered 2016-03-20 (×2)
  Filled 2016-03-18 (×2): qty 1000

## 2016-03-18 NOTE — Progress Notes (Signed)
Nutrition Follow-up  DOCUMENTATION CODES:   Not applicable  INTERVENTION:  - Will order Vital High Protein @ 45 mL/hr with 100 mL free water QID which will provide 1080 kcal (1566 kcal with current Propofol rate; 96% minimum estimated kcal needs), 94 grams of protein, and 1303 mL free water.  - RD will continue to monitor for additional needs, weaning of Propofol with need to adjust TF  NUTRITION DIAGNOSIS:   Inadequate oral intake related to inability to eat as evidenced by NPO status. - ongoing  GOAL:   Patient will meet greater than or equal to 90% of their needs -unmet  MONITOR:   Vent status, TF tolerance, Weight trends, Labs, I & O's  REASON FOR ASSESSMENT:   Consult Enteral/tube feeding initiation and management  ASSESSMENT:   30 y.o. year old female without significant past medical history, she is a smoker for past 15 years. She presented to [facility] ED with ongoing cough and shortness of breath for past 2 weeks prior to this admission. She tried mucinex but this has not provided significant symptomatic relief  3/23 Consult received for TF management. No family/visitors present at bedside to provide PTA information.  Patient is currently intubated on ventilator support with OGT in place. MV: 9.4 L/min Temp (24hrs), Avg:98.2 F (36.8 C), Min:97.6 F (36.4 C), Max:99.3 F (37.4 C)  Propofol: 18.4 ml/hr (486 kcal)  Will order TF as outlined above and will follow-up 3/24 concerning TF regimen in combination with Propofol rate at that time. Will also complete physical exam on 3/24 and document findings at that time. Nutrition needs adjusted based on current Ve and Tmax; will continue to update as necessary.  Per surgical PA note today at 1450, plan for OR with R lymph node biopsy 3/24.  Pt currently unable to meet needs with NPO status and TF not yet initiated. Medications reviewed. Labs reviewed; K: 5.5 mmol/L, Ca: 8.5 mg/dL.  Drips: Propofol @ 18.4 mL/hr, Fentanyl @  300 mcg/hr, Nimbex @ 5.5 mcg/kg/min.   3/22 - Patient is currently intubated on ventilator support - MV: 5.2 L/min; Propofol: 16.6 ml/hr - Presented to ED on BIPAP but did not tolerate and was intubated r/t respiratory distress.  - No family at bedside to provide hx. - Per MD note, extensive adenopathy noted, possible Lung mass.   Diet Order:  Diet NPO time specified  Skin:  Reviewed, no issues  Last BM:  PTA  Height:   Ht Readings from Last 1 Encounters:  03/17/16 5\' 6"  (1.676 m)    Weight:   Wt Readings from Last 1 Encounters:  03/18/16 136 lb 7.4 oz (61.9 kg)    Ideal Body Weight:  59.09 kg  BMI:  Body mass index is 22.04 kg/(m^2).  Estimated Nutritional Needs:   Kcal:  1628  Protein:  74-93 grams (1.2-1.5 grams/kg)  Fluid:  >/= 1.8 L/day  EDUCATION NEEDS:   No education needs identified at this time     Jarome Matin, RD, LDN Inpatient Clinical Dietitian Pager # [phone] After hours/weekend pager # [phone]

## 2016-03-18 NOTE — Progress Notes (Signed)
Pharmacy Antibiotic Follow-up Note  Heidi [name] is a 30 y.o. year-old female admitted on 03/16/2016 with PNA.  Pharmacy consulted to start Vancomycin and Cefepime.   Renal fxn stable.  WBC rising.  No culture data yet.   Assessment/Plan: VT= 7 mg/L Scr stable Increase Vancomycin to 1250 mg IV q8h Continue Cefepime 1g q8hr  Temp (24hrs), Avg:98.5 F (36.9 C), Min:97.9 F (36.6 C), Max:99.4 F (37.4 C)   Recent Labs Lab 03/16/16 0915 03/16/16 2003 03/17/16 0305 03/18/16 0153  WBC 13.7* 13.7* 18.6* 20.2*     Recent Labs Lab 03/16/16 0915 03/16/16 2003 03/17/16 0305 03/18/16 0153  CREATININE 0.67 0.59 0.62 0.53   Estimated Creatinine Clearance: 96.3 mL/min (by C-G formula based on Cr of 0.53).    No Known Allergies  Antimicrobials this admission: 3/21 Rocephin & Azithromcyin x1 in ED  3/21 Vancomycin >>  3/21 Cefepime >>  Levels/dose changes this admission: VT=7 mg/L on 750 mg IV q8h, change to 1250 mg IV q8h Microbiology results: 3/21 BCx: sent 3/21 Influenza panel: ordered 3/21 Strep pneumo/Legionella: IP 3/21 SputumCx: ordered 3/21 MRSA PCR negative  Thank you for allowing pharmacy to be a part of this patient's care.  Dorrene German 03/18/2016, 3:12 AM

## 2016-03-18 NOTE — Clinical Documentation Improvement (Signed)
Internal Medicine Critical Care  Conflicting documentation regarding Sepsis vs Multi-focal Post Obstructive Pneumonia/ Lung Mass with wide spread LAN; IR biopsy right axilla pending. After further study, has:  Please document response in next progress note. Thank you!    Sepsis ruled in  Sepsis ruled out  Other  Clinically Undetermined  Supporting Information:  "Sepsis criteria met on admission with tachycardia, tachypnea, hypoxia, leukocytosis" found in H&P  "normal lactic acid" found in H&P  Please exercise your independent, professional judgment when responding. A specific answer is not anticipated or expected.  Thank You, Zoila Shutter RN, BSN, [place] [phone]; Cell; [phone]

## 2016-03-18 NOTE — Progress Notes (Signed)
*  PRELIMINARY RESULTS* Echocardiogram 2D Echocardiogram has been performed.  Heidi [name] 03/18/2016, 1:37 PM

## 2016-03-18 NOTE — Consult Note (Signed)
Reason for Consult: lymph node biopsy Referring Physician: Rush Landmark, MD  Heidi [name] is an 30 y.o. female.  HPI: Pt transported to ED here at [facility] on 02/17/16 with cough and SOB for 2 weeks; in respiratory distress. CT scan showed significant lymphadenopathy in the mediastinum, hilar and right axillary area, and upper abdomen.  She was started on broad spectrum antibiotics, and seen by Pulmonary. Her respiratory status has declined and she was placed on BiPAP and has required intubation for progressive respiratory failure 03/17/16. An attempt at tissue biopsy right axilla lymph node  was performed by IR on 03/16/16. The tissue from this biopsy was inadequate and we are ask to see now for right axillary lymph node biopsy.  She is sedated and no family members are in the room currently.      Past Medical History  Diagnosis Date  . Asthma     History reviewed. No pertinent past surgical history.  History reviewed. No pertinent family history.  Social History:  reports that she has been smoking Cigarettes.  She has been smoking about 0.50 packs per day. She does not have any smokeless tobacco history on file. She reports that she drinks alcohol. Her drug history is not on file.  Allergies: No Known Allergies  Medications:  Prior to Admission:  Prescriptions prior to admission  Medication Sig Dispense Refill Last Dose  . guaiFENesin (MUCINEX) 600 MG 12 hr tablet Take 1,200 mg by mouth 2 (two) times daily as needed for cough or to loosen phlegm.   03/15/2016 at Unknown time   Scheduled: . antiseptic oral rinse  7 mL Mouth Rinse QID  . artificial tears  1 application Both Eyes 3 times per day  . budesonide (PULMICORT) nebulizer solution  0.5 mg Nebulization BID  . ceFEPime (MAXIPIME) IV  1 g Intravenous 3 times per day  . chlorhexidine gluconate (SAGE KIT)  15 mL Mouth Rinse BID  . heparin subcutaneous  5,000 Units Subcutaneous 3 times per day  . insulin aspart  0-15  Units Subcutaneous 6 times per day  . ipratropium  0.5 mg Nebulization Q4H  . levalbuterol  1.25 mg Nebulization Q4H  . methylPREDNISolone (SOLU-MEDROL) injection  60 mg Intravenous Q6H  . pantoprazole (PROTONIX) IV  40 mg Intravenous Q24H  . sodium chloride flush  3 mL Intravenous Q12H  . vancomycin  1,250 mg Intravenous Q8H   Continuous: . sodium chloride 10 mL/hr at 03/16/16 2300  . cisatracurium (NIMBEX) infusion 5.5 mcg/kg/min (03/18/16 1245)  . fentaNYL infusion INTRAVENOUS 300 mcg/hr (03/18/16 1359)  . propofol (DIPRIVAN) infusion 50 mcg/kg/min (03/18/16 0900)   NGE:XBMWUX chloride, acetaminophen **OR** acetaminophen, diphenhydrAMINE, fentaNYL, fentaNYL (SUBLIMAZE) injection, fentaNYL (SUBLIMAZE) injection, levalbuterol, ondansetron **OR** ondansetron (ZOFRAN) IV Anti-infectives    Start     Dose/Rate Route Frequency Ordered Stop   03/18/16 0800  vancomycin (VANCOCIN) 1,250 mg in sodium chloride 0.9 % 250 mL IVPB     1,250 mg 166.7 mL/hr over 90 Minutes Intravenous Every 8 hours 03/18/16 0310 03/24/16 1559   03/16/16 1800  vancomycin (VANCOCIN) IVPB 750 mg/150 ml premix  Status:  Discontinued     750 mg 150 mL/hr over 60 Minutes Intravenous Every 8 hours 03/16/16 1244 03/18/16 0310   03/16/16 1400  ceFEPIme (MAXIPIME) 1 g in dextrose 5 % 50 mL IVPB     1 g 100 mL/hr over 30 Minutes Intravenous 3 times per day 03/16/16 1204 03/24/16 1359   03/16/16 0915  cefTRIAXone (ROCEPHIN)  1 g in dextrose 5 % 50 mL IVPB     1 g 100 mL/hr over 30 Minutes Intravenous  Once 03/16/16 0903 03/16/16 0954   03/16/16 0915  azithromycin (ZITHROMAX) 500 mg in dextrose 5 % 250 mL IVPB     500 mg 250 mL/hr over 60 Minutes Intravenous  Once 03/16/16 0454 03/16/16 1050      Results for orders placed or performed during the hospital encounter of 03/16/16 (from the past 48 hour(s))  Influenza panel by pcr     Status: None   Collection Time: 03/16/16  3:17 PM  Result Value Ref Range   Influenza A By  PCR NEGATIVE NEGATIVE   Influenza B By PCR NEGATIVE NEGATIVE   H1N1 flu by pcr NOT DETECTED NOT DETECTED    Comment:        The Xpert Flu assay (FDA approved for nasal aspirates or washes and nasopharyngeal swab specimens), is intended as an aid in the diagnosis of influenza and should not be used as a sole basis for treatment. Performed at [facility]   Tissue culture     Status: None (Preliminary result)   Collection Time: 03/16/16  4:14 PM  Result Value Ref Range   Specimen Description BIOPSY LYMPH NODE    Special Requests Normal    Gram Stain PENDING    Culture      NO GROWTH 1 DAY Performed at [facility]    Report Status PENDING   MRSA PCR Screening     Status: None   Collection Time: 03/16/16  6:23 PM  Result Value Ref Range   MRSA by PCR NEGATIVE NEGATIVE    Comment:        The GeneXpert MRSA Assay (FDA approved for NASAL specimens only), is one component of a comprehensive MRSA colonization surveillance program. It is not intended to diagnose MRSA infection nor to guide or monitor treatment for MRSA infections.   HIV antibody     Status: None   Collection Time: 03/16/16  8:03 PM  Result Value Ref Range   HIV Screen 4th Generation wRfx Non Reactive Non Reactive    Comment: (NOTE) Performed At: [facility] [address] [place], [place] [id number] Lindon Romp MD WG:[id number]   Ethanol     Status: None   Collection Time: 03/16/16  8:03 PM  Result Value Ref Range   Alcohol, Ethyl (B) <5 <5 mg/dL    Comment:        LOWEST DETECTABLE LIMIT FOR SERUM ALCOHOL IS 5 mg/dL FOR MEDICAL PURPOSES ONLY   Phosphorus     Status: None   Collection Time: 03/16/16  8:03 PM  Result Value Ref Range   Phosphorus 4.2 2.5 - 4.6 mg/dL  Procalcitonin     Status: None   Collection Time: 03/16/16  8:03 PM  Result Value Ref Range   Procalcitonin 0.13 ng/mL    Comment:        Interpretation: PCT (Procalcitonin) <= 0.5 ng/mL: Systemic  infection (sepsis) is not likely. Local bacterial infection is possible. (NOTE)         ICU PCT Algorithm               Non ICU PCT Algorithm    ----------------------------     ------------------------------         PCT < 0.25 ng/mL                 PCT < 0.1 ng/mL  Stopping of antibiotics            Stopping of antibiotics       strongly encouraged.               strongly encouraged.    ----------------------------     ------------------------------       PCT level decrease by               PCT < 0.25 ng/mL       >= 80% from peak PCT       OR PCT 0.25 - 0.5 ng/mL          Stopping of antibiotics                                             encouraged.     Stopping of antibiotics           encouraged.    ----------------------------     ------------------------------       PCT level decrease by              PCT >= 0.25 ng/mL       < 80% from peak PCT        AND PCT >= 0.5 ng/mL            Continuin g antibiotics                                              encouraged.       Continuing antibiotics            encouraged.    ----------------------------     ------------------------------     PCT level increase compared          PCT > 0.5 ng/mL         with peak PCT AND          PCT >= 0.5 ng/mL             Escalation of antibiotics                                          strongly encouraged.      Escalation of antibiotics        strongly encouraged.   Basic metabolic panel     Status: Abnormal   Collection Time: 03/16/16  8:03 PM  Result Value Ref Range   Sodium 136 135 - 145 mmol/L   Potassium 4.2 3.5 - 5.1 mmol/L   Chloride 99 (L) 101 - 111 mmol/L   CO2 28 22 - 32 mmol/L   Glucose, Bld 129 (H) 65 - 99 mg/dL   BUN 7 6 - 20 mg/dL   Creatinine, Ser 5.78 0.44 - 1.00 mg/dL   Calcium 8.4 (L) 8.9 - 10.3 mg/dL   GFR calc non Af Amer >60 >60 mL/min   GFR calc Af Amer >60 >60 mL/min    Comment: (NOTE) The eGFR has been calculated using the CKD EPI equation. This calculation has  not been validated in all clinical situations. eGFR's persistently <60 mL/min signify possible Chronic Kidney Disease.    Anion gap 9 5 -  15  Magnesium     Status: None   Collection Time: 03/16/16  8:03 PM  Result Value Ref Range   Magnesium 2.3 1.7 - 2.4 mg/dL  Phosphorus     Status: None   Collection Time: 03/16/16  8:03 PM  Result Value Ref Range   Phosphorus 4.3 2.5 - 4.6 mg/dL  CBC WITH DIFFERENTIAL     Status: Abnormal   Collection Time: 03/16/16  8:03 PM  Result Value Ref Range   WBC 13.7 (H) 4.0 - 10.5 K/uL   RBC 3.86 (L) 3.87 - 5.11 MIL/uL   Hemoglobin 11.3 (L) 12.0 - 15.0 g/dL   HCT 34.6 (L) 36.0 - 46.0 %   MCV 89.6 78.0 - 100.0 fL   MCH 29.3 26.0 - 34.0 pg   MCHC 32.7 30.0 - 36.0 g/dL   RDW 14.2 11.5 - 15.5 %   Platelets 593 (H) 150 - 400 K/uL   Neutrophils Relative % 95 %   Neutro Abs 13.0 (H) 1.7 - 7.7 K/uL   Lymphocytes Relative 2 %   Lymphs Abs 0.3 (L) 0.7 - 4.0 K/uL   Monocytes Relative 3 %   Monocytes Absolute 0.4 0.1 - 1.0 K/uL   Eosinophils Relative 0 %   Eosinophils Absolute 0.0 0.0 - 0.7 K/uL   Basophils Relative 0 %   Basophils Absolute 0.0 0.0 - 0.1 K/uL  APTT     Status: None   Collection Time: 03/16/16  8:03 PM  Result Value Ref Range   aPTT 31 24 - 37 seconds  Protime-INR     Status: Abnormal   Collection Time: 03/16/16  8:03 PM  Result Value Ref Range   Prothrombin Time 15.4 (H) 11.6 - 15.2 seconds   INR 1.21 0.00 - 1.49  TSH     Status: None   Collection Time: 03/16/16  8:03 PM  Result Value Ref Range   TSH 0.486 0.350 - 4.500 uIU/mL  Blood gas, arterial     Status: Abnormal   Collection Time: 03/16/16 10:48 PM  Result Value Ref Range   O2 Content 8.0 L/min   Delivery systems NASAL CANNULA     Comment: HIGH FLOW   pH, Arterial 7.370 7.350 - 7.450   pCO2 arterial 47.0 (H) 35.0 - 45.0 mmHg   pO2, Arterial 64.9 (L) 80.0 - 100.0 mmHg   Bicarbonate 26.4 (H) 20.0 - 24.0 mEq/L   TCO2 24.4 0 - 100 mmol/L   Acid-Base Excess 1.4 0.0 - 2.0  mmol/L   O2 Saturation 88.6 %   Patient temperature 99.1    Collection site RIGHT RADIAL    Drawn by [phone]    Sample type ARTERIAL DRAW    Allens test (pass/fail) PASS PASS  CBC     Status: Abnormal   Collection Time: 03/17/16  3:05 AM  Result Value Ref Range   WBC 18.6 (H) 4.0 - 10.5 K/uL   RBC 3.70 (L) 3.87 - 5.11 MIL/uL   Hemoglobin 10.9 (L) 12.0 - 15.0 g/dL   HCT 34.7 (L) 36.0 - 46.0 %   MCV 93.8 78.0 - 100.0 fL   MCH 29.5 26.0 - 34.0 pg   MCHC 31.4 30.0 - 36.0 g/dL   RDW 14.3 11.5 - 15.5 %   Platelets 606 (H) 150 - 400 K/uL  Basic metabolic panel     Status: Abnormal   Collection Time: 03/17/16  3:05 AM  Result Value Ref Range   Sodium 136 135 - 145 mmol/L   Potassium  4.3 3.5 - 5.1 mmol/L   Chloride 101 101 - 111 mmol/L   CO2 27 22 - 32 mmol/L   Glucose, Bld 150 (H) 65 - 99 mg/dL   BUN 6 6 - 20 mg/dL   Creatinine, Ser 0.62 0.44 - 1.00 mg/dL   Calcium 8.5 (L) 8.9 - 10.3 mg/dL   GFR calc non Af Amer >60 >60 mL/min   GFR calc Af Amer >60 >60 mL/min    Comment: (NOTE) The eGFR has been calculated using the CKD EPI equation. This calculation has not been validated in all clinical situations. eGFR's persistently <60 mL/min signify possible Chronic Kidney Disease.    Anion gap 8 5 - 15  Magnesium     Status: None   Collection Time: 03/17/16  3:05 AM  Result Value Ref Range   Magnesium 2.2 1.7 - 2.4 mg/dL  Phosphorus     Status: None   Collection Time: 03/17/16  3:05 AM  Result Value Ref Range   Phosphorus 4.2 2.5 - 4.6 mg/dL  Triglycerides     Status: None   Collection Time: 03/17/16  3:05 AM  Result Value Ref Range   Triglycerides 55 <150 mg/dL    Comment: Performed at [facility]  Glucose, capillary     Status: Abnormal   Collection Time: 03/17/16  8:04 AM  Result Value Ref Range   Glucose-Capillary 151 (H) 65 - 99 mg/dL  Urine rapid drug screen (hosp performed)     Status: None   Collection Time: 03/17/16  9:40 AM  Result Value Ref Range   Opiates  NONE DETECTED NONE DETECTED   Cocaine NONE DETECTED NONE DETECTED   Benzodiazepines NONE DETECTED NONE DETECTED   Amphetamines NONE DETECTED NONE DETECTED   Tetrahydrocannabinol NONE DETECTED NONE DETECTED   Barbiturates NONE DETECTED NONE DETECTED    Comment:        DRUG SCREEN FOR MEDICAL PURPOSES ONLY.  IF CONFIRMATION IS NEEDED FOR ANY PURPOSE, NOTIFY LAB WITHIN 5 DAYS.        LOWEST DETECTABLE LIMITS FOR URINE DRUG SCREEN Drug Class       Cutoff (ng/mL) Amphetamine      1000 Barbiturate      200 Benzodiazepine   161 Tricyclics       096 Opiates          300 Cocaine          300 THC              50   Strep pneumoniae urinary antigen     Status: None   Collection Time: 03/17/16  9:40 AM  Result Value Ref Range   Strep Pneumo Urinary Antigen NEGATIVE NEGATIVE    Comment:        Infection due to S. pneumoniae cannot be absolutely ruled out since the antigen present may be below the detection limit of the test. Performed at [facility]   Blood gas, arterial     Status: Abnormal   Collection Time: 03/17/16  3:20 PM  Result Value Ref Range   FIO2 1.00    Delivery systems VENTILATOR    Mode PRESSURE REGULATED VOLUME CONTROL    LHR 18 resp/min   Peep/cpap 14.0 cm H20   pH, Arterial 7.129 (LL) 7.350 - 7.450    Comment: CRITICAL RESULT CALLED TO, READ BACK BY AND VERIFIED WITH:  J.NESTOR, MD AT 1322 BY M.JESTER RRT, RCP ON 03/17/16    pCO2 arterial 104 (HH) 35.0 - 45.0  mmHg    Comment: CRITICAL RESULT CALLED TO, READ BACK BY AND VERIFIED WITH:  J.NESTOR, MD AT 1322 BY M.JESTER RRT,RCP ON 03/17/16    pO2, Arterial 72.1 (L) 80.0 - 100.0 mmHg   Bicarbonate 33.0 (H) 20.0 - 24.0 mEq/L   TCO2 32.0 0 - 100 mmol/L   Acid-Base Excess 0.6 0.0 - 2.0 mmol/L   O2 Saturation 85.9 %   Patient temperature 98.6    Collection site RIGHT RADIAL    Drawn by [phone]    Sample type ARTERIAL DRAW    Allens test (pass/fail) PASS PASS  Glucose, capillary     Status: Abnormal    Collection Time: 03/17/16  4:07 PM  Result Value Ref Range   Glucose-Capillary 141 (H) 65 - 99 mg/dL   Comment 1 Notify RN    Comment 2 Document in Chart   Blood gas, arterial     Status: Abnormal   Collection Time: 03/17/16  5:42 PM  Result Value Ref Range   FIO2 1.00    Delivery systems VENTILATOR    Mode PRESSURE REGULATED VOLUME CONTROL    VT 360 mL   LHR 26 resp/min   Peep/cpap 14.0 cm H20   pH, Arterial 7.297 (L) 7.350 - 7.450   pCO2 arterial 64.2 (HH) 35.0 - 45.0 mmHg    Comment: CRITICAL RESULT CALLED TO, READ BACK BY AND VERIFIED WITH:  Calton Dach, MD AT 1744 BY M.JESTER RRT,RCP ON 03/17/16    pO2, Arterial 172 (H) 80.0 - 100.0 mmHg   Bicarbonate 30.2 (H) 20.0 - 24.0 mEq/L   TCO2 28.2 0 - 100 mmol/L   Acid-Base Excess 3.0 (H) 0.0 - 2.0 mmol/L   O2 Saturation 99.0 %   Patient temperature 99.3    Collection site RIGHT RADIAL    Drawn by [phone]    Sample type ARTERIAL DRAW    Allens test (pass/fail) PASS PASS  Glucose, capillary     Status: Abnormal   Collection Time: 03/17/16  8:02 PM  Result Value Ref Range   Glucose-Capillary 112 (H) 65 - 99 mg/dL  Glucose, capillary     Status: Abnormal   Collection Time: 03/18/16 12:05 AM  Result Value Ref Range   Glucose-Capillary 117 (H) 65 - 99 mg/dL  Vancomycin, trough     Status: Abnormal   Collection Time: 03/18/16  1:53 AM  Result Value Ref Range   Vancomycin Tr 7 (L) 10.0 - 20.0 ug/mL  CBC     Status: Abnormal   Collection Time: 03/18/16  1:53 AM  Result Value Ref Range   WBC 20.2 (H) 4.0 - 10.5 K/uL   RBC 3.45 (L) 3.87 - 5.11 MIL/uL   Hemoglobin 10.5 (L) 12.0 - 15.0 g/dL   HCT 33.5 (L) 36.0 - 46.0 %   MCV 97.1 78.0 - 100.0 fL   MCH 30.4 26.0 - 34.0 pg   MCHC 31.3 30.0 - 36.0 g/dL   RDW 14.4 11.5 - 15.5 %   Platelets 532 (H) 150 - 400 K/uL  Basic metabolic panel     Status: Abnormal   Collection Time: 03/18/16  1:53 AM  Result Value Ref Range   Sodium 139 135 - 145 mmol/L   Potassium 5.4 (H) 3.5 - 5.1 mmol/L     Comment: DELTA CHECK NOTED REPEATED TO VERIFY    Chloride 103 101 - 111 mmol/L   CO2 30 22 - 32 mmol/L   Glucose, Bld 126 (H) 65 - 99 mg/dL   BUN 7  6 - 20 mg/dL   Creatinine, Ser 0.53 0.44 - 1.00 mg/dL   Calcium 8.5 (L) 8.9 - 10.3 mg/dL   GFR calc non Af Amer >60 >60 mL/min   GFR calc Af Amer >60 >60 mL/min    Comment: (NOTE) The eGFR has been calculated using the CKD EPI equation. This calculation has not been validated in all clinical situations. eGFR's persistently <60 mL/min signify possible Chronic Kidney Disease.    Anion gap 6 5 - 15  Magnesium     Status: None   Collection Time: 03/18/16  1:53 AM  Result Value Ref Range   Magnesium 2.2 1.7 - 2.4 mg/dL  Phosphorus     Status: None   Collection Time: 03/18/16  1:53 AM  Result Value Ref Range   Phosphorus 4.2 2.5 - 4.6 mg/dL  Glucose, capillary     Status: Abnormal   Collection Time: 03/18/16  3:45 AM  Result Value Ref Range   Glucose-Capillary 125 (H) 65 - 99 mg/dL  Blood gas, arterial     Status: Abnormal   Collection Time: 03/18/16  4:30 AM  Result Value Ref Range   FIO2 0.60    Delivery systems VENTILATOR    Mode PRESSURE REGULATED VOLUME CONTROL    VT 360 mL   LHR 26 resp/min   Peep/cpap 14.0 cm H20   pH, Arterial 7.380 7.350 - 7.450   pCO2 arterial 55.7 (H) 35.0 - 45.0 mmHg   pO2, Arterial 136 (H) 80.0 - 100.0 mmHg   Bicarbonate 32.3 (H) 20.0 - 24.0 mEq/L   TCO2 29.8 0 - 100 mmol/L   Acid-Base Excess 6.3 (H) 0.0 - 2.0 mmol/L   O2 Saturation 98.8 %   Patient temperature 36.7    Collection site LEFT BRACHIAL    Drawn by [phone]    Sample type ARTERIAL DRAW    Allens test (pass/fail) PASS PASS  Glucose, capillary     Status: Abnormal   Collection Time: 03/18/16  8:09 AM  Result Value Ref Range   Glucose-Capillary 121 (H) 65 - 99 mg/dL   Comment 1 Notify RN    Comment 2 Document in Chart   Pregnancy, urine     Status: None   Collection Time: 03/18/16  9:50 AM  Result Value Ref Range   Preg Test,  Ur NEGATIVE NEGATIVE    Comment:        THE SENSITIVITY OF THIS METHODOLOGY IS >20 mIU/mL.   Basic metabolic panel     Status: Abnormal   Collection Time: 03/18/16 10:13 AM  Result Value Ref Range   Sodium 141 135 - 145 mmol/L   Potassium 5.5 (H) 3.5 - 5.1 mmol/L    Comment: NO VISIBLE HEMOLYSIS   Chloride 103 101 - 111 mmol/L   CO2 30 22 - 32 mmol/L   Glucose, Bld 121 (H) 65 - 99 mg/dL   BUN 9 6 - 20 mg/dL   Creatinine, Ser 0.56 0.44 - 1.00 mg/dL   Calcium 8.5 (L) 8.9 - 10.3 mg/dL   GFR calc non Af Amer >60 >60 mL/min   GFR calc Af Amer >60 >60 mL/min    Comment: (NOTE) The eGFR has been calculated using the CKD EPI equation. This calculation has not been validated in all clinical situations. eGFR's persistently <60 mL/min signify possible Chronic Kidney Disease.    Anion gap 8 5 - 15  Hepatic function panel     Status: Abnormal   Collection Time: 03/18/16 10:13 AM  Result  Value Ref Range   Total Protein 4.9 (L) 6.5 - 8.1 g/dL   Albumin 2.1 (L) 3.5 - 5.0 g/dL   AST 13 (L) 15 - 41 U/L   ALT 15 14 - 54 U/L   Alkaline Phosphatase 57 38 - 126 U/L   Total Bilirubin 0.5 0.3 - 1.2 mg/dL   Bilirubin, Direct <0.1 (L) 0.1 - 0.5 mg/dL   Indirect Bilirubin NOT CALCULATED 0.3 - 0.9 mg/dL  Glucose, capillary     Status: Abnormal   Collection Time: 03/18/16 11:33 AM  Result Value Ref Range   Glucose-Capillary 103 (H) 65 - 99 mg/dL   Comment 1 Notify RN    Comment 2 Document in Chart     US Biopsy  03/16/2016  INDICATION: 30 year old female with new diagnosis of extensive adenopathy concerning for lymphoma. EXAM: ULTRASOUND BIOPSY CORE MEDICATIONS: None. ANESTHESIA/SEDATION: None FLUOROSCOPY TIME:  None COMPLICATIONS: None immediate. Estimated blood loss: 0 PROCEDURE: Informed written consent was obtained from the patient after a thorough discussion of the procedural risks, benefits and alternatives. All questions were addressed. A timeout was performed prior to the initiation of the  procedure. The right axilla was interrogated with ultrasound. Large hypoechoic lymph nodes are identified. A suitable skin entry site was selected and marked. Local anesthesia was attained by infiltration with 1% lidocaine. A small dermatotomy was made. Under real-time sonographic guidance, multiple 16 gauge core biopsies were obtained using a Bard automated biopsy device. Biopsy specimens were placed in saline and delivered to pathology for further analysis. Post biopsy ultrasound imaging demonstrates no evidence of bleeding or complication. IMPRESSION: Technically successful ultrasound-guided core biopsy of right axillary adenopathy. Signed, Criselda Peaches, MD [facility] Electronically Signed   By: Jacqulynn Cadet M.D.   On: 03/16/2016 16:28   Dg Chest Port 1 View  03/17/2016  CLINICAL DATA:  Evaluate tube placement. EXAM: PORTABLE CHEST 1 VIEW COMPARISON:  Portable film earlier today. FINDINGS: Endotracheal tube has been inserted in good position lying 4.4 cm above the carina. Orogastric tube has also been inserted and is coronal within the stomach. BILATERAL pulmonary opacities appear stable, worse on the RIGHT. IMPRESSION: Satisfactory endotracheal tube and orogastric tube placement. Electronically Signed   By: Staci Righter M.D.   On: 03/17/2016 11:44   Dg Chest Port 1 View  03/17/2016  CLINICAL DATA:  Acute respiratory failure. Lung consolidation. Extensive adenopathy. EXAM: PORTABLE CHEST 1 VIEW COMPARISON:  Chest x-ray and CT scan dated 03/16/2016 FINDINGS: Extensive bilateral lung consolidation/infiltrates persist. Increased density at the left lung apex is felt to represent loculated pleural fluid. Heart size and pulmonary vascularity are normal. Stable slight thoracic scoliosis. IMPRESSION: Persistent extensive bilateral lung consolidation/pulmonary infiltrates. This probably represents a combination of tumor infiltration and  pneumonia. New small loculated effusion at the left apex. Electronically Signed   By: Lorriane Shire M.D.   On: 03/17/2016 11:00    Review of Systems  Unable to perform ROS: intubated   Blood pressure 100/54, pulse 92, temperature 97.6 F (36.4 C), temperature source Oral, resp. rate 26, height _0  (1.676 m), weight 61.9 kg (136 lb 7.4 oz), last menstrual period 01/11/2016, SpO2 100 %. Physical Exam  Constitutional: She is oriented to person, place, and time. She appears well-developed and well-nourished.  On vent totally sedated. BP 100/54 mmHg  Pulse 92  Temp(Src) 97.6 F (36.4 C) (Oral)  Resp 26  Ht _1  (1.676 m)  Wt 61.9 kg (136 lb 7.4  oz)  BMI 22.04 kg/m2  SpO2 100%  LMP 01/11/2016 (Approximate)  HENT:  Head: Normocephalic and atraumatic.  Intubated, difficult intubation  Eyes: Right eye exhibits no discharge. Left eye exhibits no discharge. No scleral icterus.  Neck: No JVD present. No tracheal deviation present.  Cardiovascular: Normal rate, regular rhythm, normal heart sounds and intact distal pulses.   No murmur heard. Respiratory: Effort normal. She has no wheezes. She has no rales. She exhibits no tenderness.  Full ventilator support. Right axilla has a palpable hard axillary node palpable, about 1 cm in diameter.  I did not feel any on the right.  I did not move any of the respiratory or monitoring apparatus to examine the supraclavicular areas.  GI: Soft. Bowel sounds are normal. She exhibits no distension and no mass. There is no tenderness. There is no rebound and no guarding.  Musculoskeletal: She exhibits no edema.  Lymphadenopathy:    She has no cervical adenopathy.  Neurological: She is alert and oriented to person, place, and time. No cranial nerve deficit.  Sedated and unresponsive  Skin: Skin is warm and dry. No rash noted. No erythema. No pallor.  Dry thick skin lower legs  Psychiatric:  Sedated and unresponsive    Assessment/Plan: 1.  ARDS/acute  hypoxemic respiratory failure secondary to bilateral pneumonia -- R/O infectious cause vs ILD (less likely)  Left upper lung mass, possible malignancy -- Lung CA vs lymphoproliferative disorder 2.  Diffuse lymphadenopathy in the mediastinum, hilum, upper abdomen, right axillary area. S/P IR biopsy of right axillary lymph node on 03/16/2016  Plan:  We will plan to take her to the OR and do a Right lymph node biopsy tomorrow.   Joelee Snoke 03/18/2016, 2:50 PM

## 2016-03-18 NOTE — Progress Notes (Signed)
Name: Heidi [name] MRN: [id number] DOB: 19-Sep-1986    ADMISSION DATE:  03/16/2016 CONSULTATION DATE:  3/21  REFERRING MD :  Charlies Silvers   CHIEF COMPLAINT:  Abnormal CT scan   BRIEF PATIENT DESCRIPTION:  This is a 30 year old female w/ no sig medical history. Presented to the ED on w/ cc: cough and resting dyspnea. CT chest obtained showing extensive LUL/RLL consolidative PNA w/ diffuse LAN. PCCM consulted w/ concern for malignancy.   SIGNIFICANT EVENTS  3/20 admitted. PCCM consulted 3/22 intubated for ARDS  STUDIES:  CT chest 3/21: Extensive adenopathy throughout the hila, mediastinum, and right axillary region as well as in the visualized upper abdominal retroperitoneal regions. Neoplastic etiology highly likely.Widespread pneumonitis bilaterally. There is obstruction of the left upper lobe bronchus, likely due to tumor with postobstructive pneumonitis. There is collapse of the right lower lobe without well-defined endobronchial lesion. More patchy infiltrate is noted elsewhere as noted above. Given the findings in the lungs, bronchoscopy could be most helpful to assess degree of potential tumor versus pneumonitis.No adrenal lesions evident.   HISTORY OF PRESENT ILLNESS:  This is a 30 year old female w/ no sig medical history. Presented to the ED on w/ cc: cough and resting dyspnea. Symptoms started about 2 weeks prior to presentation. Initially w/ nasal congestion and then productive cough w/ green sputum. The cough continued, this was associated w/ increased shortness of breath for which she presented to the ED on 3/21. She denied fever, did have chills. Denied sick exposure, night sweats, sore throat, did have some chest discomfort w/ cough. On presentation was tachycardic w/ increased RR.  CT chest obtained showing extensive LUL/RLL consolidative PNA w/ diffuse LAN. She was placed on broad spec abx and to be admitted by the IM service. PCCM consulted w/ concern for malignancy.     SUBJECTIVE:  Sedated, comfortable. Paralyzed.  VITAL SIGNS: Temp:  [97.9 F (36.6 C)-99.4 F (37.4 C)] 98 F (36.7 C) (03/23 0400) Pulse Rate:  [86-140] 92 (03/23 0305) Resp:  [9-30] 26 (03/23 0900) BP: (89-178)/(47-131) 115/57 mmHg (03/23 0900) SpO2:  [90 %-100 %] 100 % (03/23 0900) FiO2 (%):  [60 %-100 %] 60 % (03/23 0312) Weight:  [136 lb 7.4 oz (61.9 kg)] 136 lb 7.4 oz (61.9 kg) (03/23 0533)  PHYSICAL EXAMINATION: General:  Sedated. Paralyzed. Comfortable.  Neuro:  Sedated, paralyzed, comfortable.   HEENT:  (-) NVD. (+) ET tube.  Cardiovascular:  Tachy rrr no MRG Lungs:  B rhonchi, less than 3/22 Abdomen:  Soft, not tender + bowel sounds  Musculoskeletal:  Intact, good strength and bulk  Skin:  Dry, warm, intact    Recent Labs Lab 03/16/16 2003 03/17/16 0305 03/18/16 0153  NA 136 136 139  K 4.2 4.3 5.4*  CL 99* 101 103  CO2 28 27 30   BUN 7 6 7   CREATININE 0.59 0.62 0.53  GLUCOSE 129* 150* 126*    Recent Labs Lab 03/16/16 2003 03/17/16 0305 03/18/16 0153  HGB 11.3* 10.9* 10.5*  HCT 34.6* 34.7* 33.5*  WBC 13.7* 18.6* 20.2*  PLT 593* 606* 532*   Ct Chest W Contrast  03/16/2016  CLINICAL DATA:  Shortness of Breath EXAM: CT CHEST WITH CONTRAST TECHNIQUE: Multidetector CT imaging of the chest was performed during intravenous contrast administration. CONTRAST:  29mL OMNIPAQUE IOHEXOL 300 MG/ML  SOLN COMPARISON:  Chest radiograph March 16, 2016 FINDINGS: Mediastinum/Lymph Nodes: Thyroid appears unremarkable. There is widespread adenopathy. There are enlarged lymph nodes throughout the right axilla  with the largest individual lymph node in the right axilla measuring 3.4 x 2.6 cm. There is extensive anterior mediastinal adenopathy. The largest individual lymph node in the anterior mediastinum measures 2.3 x 1.5 cm. There are multiple right peritracheal lymph nodes with the largest individual lymph node in the right peritracheal region 9 measuring 2.0 x 1.5 cm. There  are lymph nodes tracking along the left-side of the aortic arch. A lymph node in the aortopulmonary window region measures 2.0 x 1.3 cm. There are lymph nodes in each hilar region. The largest lymph node in the right hilum measures 2.3 x 1.4 cm. Lymph nodes in the left hilum appear matched Ed there are difficult to distinguish from 1 another. There is a lymph node in the inferior left hilum measuring 1.6 x 1.3 cm. There is sub- carinal adenopathy with the largest sub- carinal lymph node measuring 2.2 x 1.8 cm. There is no thoracic aortic aneurysm or dissection. The visualized great vessels appear unremarkable. There is no major vessel pulmonary embolus evident. There is a rather minimal pericardial effusion. Lungs/Pleura: There is consolidation throughout essentially the entire left upper lobe. There is abrupt termination of the proximal left upper lobe bronchus, likely due to an endobronchial lesion in the left upper lobe with postobstructive pneumonitis. There is widespread airspace consolidation throughout the right upper lobe. There is consolidation of most of the right lower lobe with narrowing of the right lower lobe bronchus. An endobronchial lesion on the right is not demonstrated, although there may well be neoplasm in the right lower lobe with surrounding pneumonitis. A lesser degree of infiltrate is noted in the right middle lobe. Patchy pneumonia is also noted in the right lower lobe, primarily in the posterior segment region. Lingula is collapsed along with the remainder of the left upper lobe. There is no appreciable pleural effusion. Upper abdomen: In the visualized upper abdomen, there is extensive retroperitoneal adenopathy. The largest individual lymph node visualized is located between the aorta and left kidney measuring 2.1 x 1.3 cm. A second lymph node between the aorta and inferior vena cava measures 2.0 x 1.2 cm. Adrenals appear unremarkable. Visualized upper abdominal structures otherwise  appear unremarkable. Musculoskeletal: No blastic or lytic bone lesions are evident. IMPRESSION: Extensive adenopathy throughout the hila, mediastinum, and right axillary region as well as in the visualized upper abdominal retroperitoneal regions. 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 Widespread pneumonitis bilaterally. There is obstruction of the left upper lobe bronchus, likely due to tumor with postobstructive pneumonitis. There is collapse of the right lower lobe without well-defined endobronchial lesion. More patchy infiltrate is noted elsewhere as noted above. Given the findings in the lungs, bronchoscopy could be most helpful to assess degree of potential tumor versus pneumonitis. No adrenal lesions evident. Electronically Signed   By: Lowella Grip III M.D.   On: 03/16/2016 11:19   US Biopsy  03/16/2016  INDICATION: 30 year old female with new diagnosis of extensive adenopathy concerning for lymphoma. EXAM: ULTRASOUND BIOPSY CORE MEDICATIONS: None. ANESTHESIA/SEDATION: None FLUOROSCOPY TIME:  None COMPLICATIONS: None immediate. Estimated blood loss: 0 PROCEDURE: Informed written consent was obtained from the patient after a thorough discussion of the procedural risks, benefits and alternatives. All questions were addressed. A timeout was performed prior to the initiation of the procedure. The right axilla was interrogated with ultrasound. Large hypoechoic lymph nodes are identified. A suitable skin entry site was selected and marked. Local anesthesia was attained by infiltration with 1% lidocaine. A small dermatotomy was made. Under real-time sonographic guidance,  multiple 16 gauge core biopsies were obtained using a Bard automated biopsy device. Biopsy specimens were placed in saline and delivered to pathology for further analysis. Post biopsy ultrasound imaging demonstrates no evidence of bleeding or complication. IMPRESSION: Technically successful ultrasound-guided core biopsy of right axillary  adenopathy. Signed, Criselda Peaches, MD [facility] Electronically Signed   By: Jacqulynn Cadet M.D.   On: 03/16/2016 16:28   Dg Chest Port 1 View  03/17/2016  CLINICAL DATA:  Evaluate tube placement. EXAM: PORTABLE CHEST 1 VIEW COMPARISON:  Portable film earlier today. FINDINGS: Endotracheal tube has been inserted in good position lying 4.4 cm above the carina. Orogastric tube has also been inserted and is coronal within the stomach. BILATERAL pulmonary opacities appear stable, worse on the RIGHT. IMPRESSION: Satisfactory endotracheal tube and orogastric tube placement. Electronically Signed   By: Staci Righter M.D.   On: 03/17/2016 11:44   Dg Chest Port 1 View  03/17/2016  CLINICAL DATA:  Acute respiratory failure. Lung consolidation. Extensive adenopathy. EXAM: PORTABLE CHEST 1 VIEW COMPARISON:  Chest x-ray and CT scan dated 03/16/2016 FINDINGS: Extensive bilateral lung consolidation/infiltrates persist. Increased density at the left lung apex is felt to represent loculated pleural fluid. Heart size and pulmonary vascularity are normal. Stable slight thoracic scoliosis. IMPRESSION: Persistent extensive bilateral lung consolidation/pulmonary infiltrates. This probably represents a combination of tumor infiltration and pneumonia. New small loculated effusion at the left apex. Electronically Signed   By: Lorriane Shire M.D.   On: 03/17/2016 11:00    ASSESSMENT / PLAN:    PULMONARY A: ARDS/acute hypoxemic respiratory failure secondary to bilateral pneumonia -- R/O infectious cause vs ILD (less likely) Left upper lung mass, possible malignancy -- Lung CA vs lymphoproliferative disorder. P:   Full vent support. On ARDS protocol. Currently paralyzed. Sedated. ABG this morning reassuring. 7.38/56/136 on 60% and 16 of PEEP. VAP prevention measures. On high-dose steroids for possible inflammatory lung disease. Continue budesonide and  Atrovent. CXR in AM. Sent CTD/rheumatologic workup. Plan for diagnostic bronchoscopy in the morning.  CARDIOVASCULAR A:  Sinus tachycardia. Pressures stable. P:  Plan for echo. Observe blood pressure. Not on pressors.   RENAL A:   Episode of hyperkalemia. P:   Repeat BMP now. Keep euvolemic.   GASTROINTESTINAL A:   No issues. P:   Start TF Check LFts.   HEMATOLOGIC A:   Patient with left upper lung mass, diffuse lymphadenopathy in the mediastinum, hilum, upper abdomen, right axillary area. S/P biopsy of right axillary lymph node on 03/16/2016. P:  Awaiting final pathology on right axillary lymph node biopsy. I spoke with Dr. Gari Crown and unofficially he thinks it might be Lymphoma. If IR biopsy is inconclusive, patient will need a surgical lymph node biopsy of the right axillary lymph node. SCD's / Heparin SQ   INFECTIOUS A:   Pneumonia. Cultures are negative so far.  P:   Continue cefepime and vancomycin for now.  ENDOCRINE A:   No issues P:   Check BS, sliding scale.    NEUROLOGIC A:   Paralyzed. Sedated. P:   Sedation:  propofol gtt / Fentanyl gtt / Cisatracurium gtt. RASS goal:  -1     Family updated: I extensively spoke with the patient's mother and sister regarding her overall critical condition. They understand. I discussed with mother regarding bronchoscopy and potential side effects such as, not limited to, pneumothorax, hypoxemia, bleeding, adverse reactions to medicines, Cardio  vascular combinations. They understand the necessity of procedure  and agreed to the procedure and accept the risks.   I spent 40 minutes of critical care time with this patient today.   03/18/2016, 9:31 AM

## 2016-03-18 NOTE — Progress Notes (Signed)
Date:  March 19, 2016 Chart reviewed for concurrent status and case management needs. Will continue to follow patient for changes and needs: Remains on full vent support. Velva Harman, BSN, [place], [place]   [phone]

## 2016-03-18 NOTE — Progress Notes (Signed)
eLink Physician-Brief Progress Note Patient Name: Heidi [name] DOB: 1986-12-11 MRN: [id number]   Date of Service  03/18/2016  HPI/Events of Note  Bedside nurse reports limited urine output. Currently normotensive. Started on tube feeds today & receiving free water flushes.  eICU Interventions  1. Bladder scan now 2. Increase free water via tube     Intervention Category Intermediate Interventions: Oliguria - evaluation and management  [name] 03/18/2016, 6:52 PM

## 2016-03-19 ENCOUNTER — Telehealth: Payer: Self-pay | Admitting: Pulmonary Disease

## 2016-03-19 ENCOUNTER — Inpatient Hospital Stay (HOSPITAL_COMMUNITY): Payer: MEDICAID

## 2016-03-19 ENCOUNTER — Inpatient Hospital Stay (HOSPITAL_COMMUNITY): Payer: MEDICAID | Admitting: Certified Registered Nurse Anesthetist

## 2016-03-19 ENCOUNTER — Encounter (HOSPITAL_COMMUNITY): Payer: Self-pay

## 2016-03-19 ENCOUNTER — Inpatient Hospital Stay (HOSPITAL_COMMUNITY): Payer: Self-pay

## 2016-03-19 ENCOUNTER — Encounter (HOSPITAL_COMMUNITY)
Admission: EM | Disposition: A | Discharge status: Home / Self Care | Payer: Self-pay | Source: Home / Self Care | Attending: Pulmonary Disease

## 2016-03-19 ENCOUNTER — Inpatient Hospital Stay (HOSPITAL_COMMUNITY): Payer: Self-pay | Admitting: Certified Registered Nurse Anesthetist

## 2016-03-19 ENCOUNTER — Encounter (HOSPITAL_COMMUNITY): Payer: Self-pay | Admitting: Certified Registered Nurse Anesthetist

## 2016-03-19 HISTORY — PX: AXILLARY LYMPH NODE BIOPSY: SHX5737

## 2016-03-19 LAB — BLOOD GAS, ARTERIAL
Acid-Base Excess: 0.6 mmol/L (ref 0.0–2.0)
BICARBONATE: 33 meq/L — AB (ref 20.0–24.0)
Drawn by: 441261
FIO2: 1
LHR: 18 {breaths}/min
MECHVT: 360 mL
O2 Saturation: 85.9 %
PEEP/CPAP: 14 cmH2O
PO2 ART: 72.1 mmHg — AB (ref 80.0–100.0)
Patient temperature: 98.6
TCO2: 32 mmol/L (ref 0–100)
pCO2 arterial: 104 mmHg (ref 35.0–45.0)
pH, Arterial: 7.129 — CL (ref 7.350–7.450)

## 2016-03-19 LAB — ANCA TITERS
Atypical P-ANCA titer: <1:20 {titer}
P-ANCA: <1:20 {titer}

## 2016-03-19 LAB — ANTI-SCLERODERMA ANTIBODY: Scleroderma (Scl-70) (ENA) Antibody, IgG: <0.2 AI (ref 0.0–0.9)

## 2016-03-19 LAB — BASIC METABOLIC PANEL
Anion gap: 8 (ref 5–15)
BUN: 13 mg/dL (ref 6–20)
CALCIUM: 8.6 mg/dL — AB (ref 8.9–10.3)
CO2: 30 mmol/L (ref 22–32)
CREATININE: 0.58 mg/dL (ref 0.44–1.00)
Chloride: 102 mmol/L (ref 101–111)
Glucose, Bld: 109 mg/dL — ABNORMAL HIGH (ref 65–99)
Potassium: 5.4 mmol/L — ABNORMAL HIGH (ref 3.5–5.1)
SODIUM: 140 mmol/L (ref 135–145)

## 2016-03-19 LAB — RHEUMATOID FACTOR: Rhuematoid fact SerPl-aCnc: 17.8 IU/mL — ABNORMAL HIGH (ref 0.0–13.9)

## 2016-03-19 LAB — VANCOMYCIN, TROUGH: Vancomycin Tr: 31 ug/mL (ref 10.0–20.0)

## 2016-03-19 LAB — GLUCOSE, CAPILLARY
Glucose-Capillary: 118 mg/dL — ABNORMAL HIGH (ref 65–99)
Glucose-Capillary: 126 mg/dL — ABNORMAL HIGH (ref 65–99)
Glucose-Capillary: 134 mg/dL — ABNORMAL HIGH (ref 65–99)
Glucose-Capillary: 83 mg/dL (ref 65–99)
Glucose-Capillary: 94 mg/dL (ref 65–99)

## 2016-03-19 LAB — CBC
HCT: 33.6 % — ABNORMAL LOW (ref 36.0–46.0)
HEMOGLOBIN: 10.9 g/dL — AB (ref 12.0–15.0)
MCH: 30 pg (ref 26.0–34.0)
MCHC: 32.4 g/dL (ref 30.0–36.0)
MCV: 92.6 fL (ref 78.0–100.0)
PLATELETS: 497 10*3/uL — AB (ref 150–400)
RBC: 3.63 MIL/uL — ABNORMAL LOW (ref 3.87–5.11)
RDW: 14.5 % (ref 11.5–15.5)
WBC: 13 10*3/uL — ABNORMAL HIGH (ref 4.0–10.5)

## 2016-03-19 LAB — ANTINUCLEAR ANTIBODIES, IFA: ANTINUCLEAR ANTIBODIES, IFA: NEGATIVE

## 2016-03-19 LAB — ANTI-SMITH ANTIBODY: ENA SM Ab Ser-aCnc: <0.2 AI (ref 0.0–0.9)

## 2016-03-19 LAB — MPO/PR-3 (ANCA) ANTIBODIES

## 2016-03-19 LAB — MAGNESIUM: MAGNESIUM: 2.8 mg/dL — AB (ref 1.7–2.4)

## 2016-03-19 LAB — PHOSPHORUS: PHOSPHORUS: 3.8 mg/dL (ref 2.5–4.6)

## 2016-03-19 LAB — ANTI-JO 1 ANTIBODY, IGG: Anti JO-1: <0.2 AI (ref 0.0–0.9)

## 2016-03-19 SURGERY — AXILLARY LYMPH NODE BIOPSY
Anesthesia: General | Site: Axilla | Laterality: Right

## 2016-03-19 SURGERY — BRONCHOSCOPY, WITH FLUOROSCOPY
Anesthesia: Moderate Sedation | Laterality: Bilateral

## 2016-03-19 MED ORDER — SODIUM CHLORIDE 0.9 % IV SOLN
1.0000 g | Freq: Once | INTRAVENOUS | Status: AC
  Administered 2016-03-19: 1 g via INTRAVENOUS
  Filled 2016-03-19 (×2): qty 10

## 2016-03-19 MED ORDER — FENTANYL CITRATE (PF) 250 MCG/5ML IJ SOLN
INTRAMUSCULAR | Status: AC
  Filled 2016-03-19: qty 5

## 2016-03-19 MED ORDER — MIDAZOLAM HCL 2 MG/2ML IJ SOLN
2.0000 mg | Freq: Once | INTRAMUSCULAR | Status: AC
  Administered 2016-03-19: 2 mg via INTRAVENOUS

## 2016-03-19 MED ORDER — MIDAZOLAM HCL 2 MG/2ML IJ SOLN
INTRAMUSCULAR | Status: AC
  Filled 2016-03-19: qty 2

## 2016-03-19 MED ORDER — SODIUM CHLORIDE 0.9 % IV SOLN
0.0000 ug/h | INTRAVENOUS | Status: DC
  Administered 2016-03-20 (×2): 375 ug/h via INTRAVENOUS
  Filled 2016-03-19 (×3): qty 50

## 2016-03-19 MED ORDER — ONDANSETRON HCL 4 MG/2ML IJ SOLN
INTRAMUSCULAR | Status: AC
  Filled 2016-03-19: qty 2

## 2016-03-19 MED ORDER — BUPIVACAINE-EPINEPHRINE 0.25% -1:200000 IJ SOLN
INTRAMUSCULAR | Status: DC | PRN
  Administered 2016-03-19: 10 mL

## 2016-03-19 MED ORDER — SUGAMMADEX SODIUM 200 MG/2ML IV SOLN
INTRAVENOUS | Status: AC
  Filled 2016-03-19: qty 2

## 2016-03-19 MED ORDER — LIDOCAINE HCL (CARDIAC) 20 MG/ML IV SOLN
INTRAVENOUS | Status: AC
  Filled 2016-03-19: qty 5

## 2016-03-19 MED ORDER — ROCURONIUM BROMIDE 100 MG/10ML IV SOLN
INTRAVENOUS | Status: AC
  Filled 2016-03-19: qty 1

## 2016-03-19 MED ORDER — PROPOFOL 10 MG/ML IV BOLUS
INTRAVENOUS | Status: AC
  Filled 2016-03-19: qty 20

## 2016-03-19 MED ORDER — VANCOMYCIN HCL IN DEXTROSE 750-5 MG/150ML-% IV SOLN
750.0000 mg | Freq: Three times a day (TID) | INTRAVENOUS | Status: DC
  Administered 2016-03-19 – 2016-03-20 (×2): 750 mg via INTRAVENOUS
  Filled 2016-03-19 (×3): qty 150

## 2016-03-19 MED ORDER — METHYLPREDNISOLONE SODIUM SUCC 125 MG IJ SOLR
60.0000 mg | Freq: Two times a day (BID) | INTRAMUSCULAR | Status: DC
  Administered 2016-03-19 – 2016-03-21 (×4): 60 mg via INTRAVENOUS
  Filled 2016-03-19 (×4): qty 2

## 2016-03-19 MED ORDER — MIDAZOLAM HCL 5 MG/5ML IJ SOLN
INTRAMUSCULAR | Status: DC | PRN
  Administered 2016-03-19: 2 mg via INTRAVENOUS

## 2016-03-19 MED ORDER — LIP MEDEX EX OINT
TOPICAL_OINTMENT | CUTANEOUS | Status: AC
  Administered 2016-03-19: 1
  Filled 2016-03-19: qty 7

## 2016-03-19 MED ORDER — FUROSEMIDE 10 MG/ML IJ SOLN
20.0000 mg | Freq: Once | INTRAMUSCULAR | Status: AC
  Administered 2016-03-19: 20 mg via INTRAVENOUS
  Filled 2016-03-19: qty 2

## 2016-03-19 MED ORDER — BUPIVACAINE-EPINEPHRINE (PF) 0.25% -1:200000 IJ SOLN
INTRAMUSCULAR | Status: AC
  Filled 2016-03-19: qty 30

## 2016-03-19 SURGICAL SUPPLY — 34 items
APPLIER CLIP 11 MED OPEN (CLIP)
BENZOIN TINCTURE PRP APPL 2/3 (GAUZE/BANDAGES/DRESSINGS) ×3 IMPLANT
BLADE HEX COATED 2.75 (ELECTRODE) ×3 IMPLANT
BLADE SURG 15 STRL LF DISP TIS (BLADE) ×2 IMPLANT
BLADE SURG 15 STRL SS (BLADE) ×4
BLADE SURG SZ11 CARB STEEL (BLADE) ×3 IMPLANT
CLIP APPLIE 11 MED OPEN (CLIP) IMPLANT
CLOSURE WOUND 1/2 X4 (GAUZE/BANDAGES/DRESSINGS) ×1
COVER SURGICAL LIGHT HANDLE (MISCELLANEOUS) ×3 IMPLANT
DECANTER SPIKE VIAL GLASS SM (MISCELLANEOUS) ×3 IMPLANT
DRAPE LAPAROTOMY TRNSV 102X78 (DRAPE) ×3 IMPLANT
ELECT PENCIL ROCKER SW 15FT (MISCELLANEOUS) ×3 IMPLANT
ELECT REM PT RETURN 9FT ADLT (ELECTROSURGICAL) ×3
ELECTRODE REM PT RTRN 9FT ADLT (ELECTROSURGICAL) ×1 IMPLANT
GAUZE SPONGE 4X4 12PLY STRL (GAUZE/BANDAGES/DRESSINGS) ×3 IMPLANT
GAUZE SPONGE 4X4 16PLY XRAY LF (GAUZE/BANDAGES/DRESSINGS) ×3 IMPLANT
GLOVE BIOGEL PI IND STRL 7.0 (GLOVE) ×1 IMPLANT
GLOVE BIOGEL PI INDICATOR 7.0 (GLOVE) ×2
GLOVE SURG ORTHO 8.0 STRL STRW (GLOVE) ×3 IMPLANT
GOWN STRL REUS W/TWL LRG LVL3 (GOWN DISPOSABLE) ×3 IMPLANT
GOWN STRL REUS W/TWL XL LVL3 (GOWN DISPOSABLE) ×6 IMPLANT
KIT BASIN OR (CUSTOM PROCEDURE TRAY) ×3 IMPLANT
LIQUID BAND (GAUZE/BANDAGES/DRESSINGS) ×3 IMPLANT
MARKER SKIN DUAL TIP RULER LAB (MISCELLANEOUS) ×3 IMPLANT
NEEDLE HYPO 25X1 1.5 SAFETY (NEEDLE) ×3 IMPLANT
NS IRRIG 1000ML POUR BTL (IV SOLUTION) ×3 IMPLANT
PACK BASIC VI WITH GOWN DISP (CUSTOM PROCEDURE TRAY) ×3 IMPLANT
SOL PREP POV-IOD 4OZ 10% (MISCELLANEOUS) ×3 IMPLANT
SPONGE LAP 4X18 X RAY DECT (DISPOSABLE) ×3 IMPLANT
STRIP CLOSURE SKIN 1/2X4 (GAUZE/BANDAGES/DRESSINGS) ×2 IMPLANT
SUT MNCRL AB 4-0 PS2 18 (SUTURE) ×3 IMPLANT
SYR CONTROL 10ML LL (SYRINGE) ×3 IMPLANT
TOWEL OR 17X26 10 PK STRL BLUE (TOWEL DISPOSABLE) ×3 IMPLANT
YANKAUER SUCT BULB TIP 10FT TU (MISCELLANEOUS) ×3 IMPLANT

## 2016-03-19 NOTE — Procedures (Signed)
Bronchoscopy Procedure Note Heidi [name] [id number] Oct 12, 1986  Procedure: Bronchoscopy,           BAL, Endobronchial Brushing and Biopsy in LUL.            Sedation  Indications: Obtain specimens for culture and/or other diagnostic studies  Procedure Details Consent: Risks of procedure as well as the alternatives and risks of each were explained to the (patient/caregiver).  Consent for procedure obtained. Time Out: Verified patient identification, verified procedure, site/side was marked, verified correct patient position, special equipment/implants available, medications/allergies/relevent history reviewed, required imaging and test results available.  Performed  In preparation for procedure, patient was given 100% FiO2 and bronchoscope lubricated. Sedation: Pt was on nimbex. Pt was on propofol at 50 mcg/kg/min, Fentanyl drip at 300 mcg/hr.   Patient received extra sedation with versed 4 mg IV and Fentanyl 50 mcg -- both IV and divided doses.  Fentanyl given at 9:30 am (first sedation) and we terminated procedure at 10am.   Airway entered via ET tube and the following bronchi were examined: RUL, RML, RLL, LUL, LLL and Bronchi.   Procedures performed: Brushings performed, BAL and Bx of LUL mass.   Patient had purulent secretions in LUL area which were suctioned out. Also, purulent secretions in RML/RLL which were suctioned out.  RUL, RML, RLL were patent with no masses seen. Only purulent secretions in RML and RLL. Mucosa was N in R bronchail tree.  Patient had a fungating mass in LUL which almost completely occludes the airway. Mass with purulent secretions. Mass was friable.  BAL, brushing, Bx done over at the LUL mass. BAL with 120 ml saline with more than 50% return obtained.  Brushings and Bx done with bleeding noted. Bleeding controlled by saline.  No bleeding noted prior to scope being removed.    Bronchoscope removed.    Will send samples for culture, cytology, flow  cytometry, Bx.   Evaluation Hemodynamic Status: BP stable throughout; O2 sats: stable throughout Patient's Current Condition: stable Specimens:  BAL, brushing, Bx from LUL.  Complications: No apparent complications Patient did tolerate procedure well.   Monica Becton, MD 03/19/2016, 10:25 AM [facility] Pager [phone] After 3 pm or if no answer, call [phone]

## 2016-03-19 NOTE — Telephone Encounter (Signed)
Spoke with Butch Penny at flow cytometry lab, states that Dr. Gari Crown, Pathologist wants AD to know that pt has had lymph node needle core biopsy that is suspicious for Hodgkins lymphoma States that flow cytometry ordered on the pathology on the BAL from this morning will not help diagnose this.  All other pathology tests ordered will be processed as ordered, but states this one is nonessential to pt's diagnosis.  Dr. Gari Crown states that AD can call him at [phone] if he has any further questions.    Forwarding to AD as FYI.  Thanks!

## 2016-03-19 NOTE — Brief Op Note (Signed)
03/16/2016 - 03/19/2016  4:43 PM  PATIENT:  Heidi [name]  30 y.o. female  PRE-OPERATIVE DIAGNOSIS:  Right axillary lymphadenopathy, rule out lymphoma  POST-OPERATIVE DIAGNOSIS:  same  PROCEDURE:  Procedure(s): AXILLARY LYMPH NODE BIOPSY (Right)  SURGEON:  Surgeon(s) and Role:    * Armandina Gemma, MD - Primary  ANESTHESIA:   general  EBL:  Total I/O In: 1299.3 [I.V.:999.3; IV Piggyback:300] Out: 1000 [Urine:1000]  BLOOD ADMINISTERED:none  DRAINS: none   LOCAL MEDICATIONS USED:  MARCAINE     SPECIMEN:  Excision  DISPOSITION OF SPECIMEN:  PATHOLOGY  COUNTS:  YES  TOURNIQUET:  * No tourniquets in log *  DICTATION: .Other Dictation: Dictation Number [id number]  PLAN OF CARE: Admit to inpatient   PATIENT DISPOSITION:  ICU - intubated and critically ill.   Delay start of Pharmacological VTE agent (>24hrs) due to surgical blood loss or risk of bleeding: yes  Earnstine Regal, MD, [facility] Office: [phone]

## 2016-03-19 NOTE — Procedures (Signed)
Bedside Bronchoscopy Procedure Note Heidi [name] [id number] 1986/05/29  Procedure: Bronchoscopy Indications: Diagnostic evaluation of the airways and Obtain specimens for culture and/or other diagnostic studies  Procedure Details: ET Tube Size: 7.5 ET Tube secured at lip (cm): 24 Bite block in place: Yes In preparation for procedure, Patient hyper-oxygenated with 100 % FiO2 Airway entered and the following bronchi were examined: LUL and LLL.   Bronchoscope removed.  , Patient placed back on 100% FiO2 at conclusion of procedure.    Evaluation BP 122/69 mmHg  Pulse 75  Temp(Src) 97.6 F (36.4 C) (Oral)  Resp 26  Ht 5\' 6"  (1.676 m)  Wt 138 lb 3.7 oz (62.7 kg)  BMI 22.32 kg/m2  SpO2 100%  LMP 01/11/2016 (Approximate) Breath Sounds:Rhonch O2 sats: stable throughout Patient's Current Condition: stable Specimens:  Brushings, biopsies, washings Complications: No apparent complications Patient did tolerate procedure well.   Kathie Dike 03/19/2016, 10:16 AM

## 2016-03-19 NOTE — Progress Notes (Signed)
45 cc of Fentanyl were wasted down the sink with Katherine Mantle, RN as a witness.

## 2016-03-19 NOTE — Anesthesia Preprocedure Evaluation (Addendum)
Anesthesia Evaluation  Patient identified by MRN, date of birth, ID band Patient unresponsive    Reviewed: Allergy & Precautions, Patient's Chart, lab work & pertinent test results, Unable to perform ROS - Chart review only  History of Anesthesia Complications Negative for: history of anesthetic complications  Airway Mallampati: Intubated       Dental   Pulmonary asthma , pneumonia, unresolved, Current Smoker,  Intubated and ventilated: ARDS/pneumonia   + rhonchi    + intubated    Cardiovascular  Rhythm:Regular Rate:Normal  03/18/16 ECHO:  EF 55-60%, valves OK   Neuro/Psych sedated    GI/Hepatic negative GI ROS, Neg liver ROS,   Endo/Other  negative endocrine ROS  Renal/GU negative Renal ROS     Musculoskeletal   Abdominal   Peds  Hematology  (+) Blood dyscrasia (Hb 10.9), ,   Anesthesia Other Findings   Reproductive/Obstetrics                            Anesthesia Physical Anesthesia Plan  ASA: IV  Anesthesia Plan: General   Post-op Pain Management:    Induction: Inhalational  Airway Management Planned: Oral ETT  Additional Equipment:   Intra-op Plan:   Post-operative Plan: Post-operative intubation/ventilation  Informed Consent:   History available from chart only  Plan Discussed with: Surgeon and CRNA  Anesthesia Plan Comments: (Plan routine monitors, GETA via existing ETT Pt intubated and sedated, so Dr. Harlow Asa obtained consent)        Anesthesia Quick Evaluation

## 2016-03-19 NOTE — Progress Notes (Signed)
Pharmacy Antibiotic Note  Heidi [name] is a 30 y.o. female admitted on 03/16/2016 with post-obstructive pneumonia.  Pharmacy has been consulted for Vancomycin dosing.  3/22 CT: Persistent extensive bilateral lung consolidation /pulmonary infiltrates - probably represents combination of tumor infiltration and PNA  Vancomycin trough supratherapeutic (31 mcg/mL) drawn 6 hours after dose on 1250 mg IV q8h.  SCr stable.  Plan: Decrease vancomycin to 750 mg IV q8h.   Check VT at new steady state.  Height: 5\' 6"  (167.6 cm) Weight: 138 lb 3.7 oz (62.7 kg) IBW/kg (Calculated) : 59.3  Temp (24hrs), Avg:98.2 F (36.8 C), Min:97.4 F (36.3 C), Max:98.8 F (37.1 C)   Recent Labs Lab 03/16/16 0915 03/16/16 0924 03/16/16 1247 03/16/16 2003 03/17/16 0305 03/18/16 0153 03/18/16 1013 03/19/16 0334 03/19/16 1430  WBC 13.7*  --   --  13.7* 18.6* 20.2*  --  13.0*  --   CREATININE 0.67  --   --  0.59 0.62 0.53 0.56 0.58  --   LATICACIDVEN  --  1.3 1.9  --   --   --   --   --   --   VANCOTROUGH  --   --   --   --   --  7*  --   --  31*    Estimated Creatinine Clearance: 96.3 mL/min (by C-G formula based on Cr of 0.58).    No Known Allergies  Antimicrobials this admission: 3/21 Rocephin & Azithromcyin x1 in ED  3/21 Vancomycin >>  3/21 Cefepime >>  Levels/dose changes this admission: 3/22: VT = 7 mg/L on 750mg  q8h, change Vanc to 1250mg  IV q8h 3/24 VT = 31 (drawn 6 hours after dose, 1250 mg IV q8h), decrease to 750 mg IV q8h  Microbiology results: 3/21 BCx: NGTD 3/21 Influenza panel: negative 3/21 Strep pneumo/Legionella: neg/ 3/21 SputumCx: ordered 3/21 lymph node bx: NGTD 3/21 MRSA PCR negative 3/21 HIV: nonreactive  Thank you for allowing pharmacy to be a part of this patient's care.  Heidi [name] 03/19/2016 3:57 PM

## 2016-03-19 NOTE — Progress Notes (Signed)
Name: Heidi [name] MRN: [id number] DOB: 01-01-1986    ADMISSION DATE:  03/16/2016 CONSULTATION DATE:  3/21  REFERRING MD :  Charlies Silvers   CHIEF COMPLAINT:  Abnormal CT scan   BRIEF PATIENT DESCRIPTION:  This is a 30 year old female w/ no sig medical history. Presented to the ED on w/ cc: cough and resting dyspnea. CT chest obtained showing extensive LUL/RLL consolidative PNA w/ diffuse LAN. PCCM consulted w/ concern for malignancy.   SIGNIFICANT EVENTS  3/20 admitted. PCCM consulted 3/21 Bx of R axillary LN by IR 3/22 intubated for ARDS 3/24 Bronchoscopy done  CULTURES: Sputum ordered>>not done ?  Blood 3/21 >> (-) MRSA 3/21 >> (-) R axillary Bx >> (-) HIV 3/21 >> (-) Influenza 3/21 >> (-)   STUDIES:  2decho (3/23) EF 55%, small pericardial effusion  CT chest 3/21: Extensive adenopathy throughout the hila, mediastinum, and right axillary region as well as in the visualized upper abdominal retroperitoneal regions. Neoplastic etiology highly likely.Widespread pneumonitis bilaterally. There is obstruction of the left upper lobe bronchus, likely due to tumor with postobstructive pneumonitis. There is collapse of the right lower lobe without well-defined endobronchial lesion. More patchy infiltrate is noted elsewhere as noted above. Given the findings in the lungs, bronchoscopy could be most helpful to assess degree of potential tumor versus pneumonitis.No adrenal lesions evident.   HISTORY OF PRESENT ILLNESS:  This is a 30 year old female w/ no sig medical history. Presented to the ED on w/ cc: cough and resting dyspnea. Symptoms started about 2 weeks prior to presentation. Initially w/ nasal congestion and then productive cough w/ green sputum. The cough continued, this was associated w/ increased shortness of breath for which she presented to the ED on 3/21. She denied fever, did have chills. Denied sick exposure, night sweats, sore throat, did have some chest discomfort w/  cough. On presentation was tachycardic w/ increased RR.  CT chest obtained showing extensive LUL/RLL consolidative PNA w/ diffuse LAN. She was placed on broad spec abx and to be admitted by the IM service. PCCM consulted w/ concern for malignancy.   ANTIBIOTICS : Cefepime (3/21) >> Vanc (3/21) >>    SUBJECTIVE:  Sedated, comfortable. Paralyzed.  VITAL SIGNS: Temp:  [97.4 F (36.3 C)-98.8 F (37.1 C)] 98.8 F (37.1 C) (03/24 0400) Pulse Rate:  [75-88] 75 (03/24 0452) Resp:  [26-30] 26 (03/24 0730) BP: (100-143)/(54-85) 122/69 mmHg (03/24 0730) SpO2:  [100 %] 100 % (03/24 0730) FiO2 (%):  [40 %-60 %] 40 % (03/24 0452) Weight:  [138 lb 3.7 oz (62.7 kg)] 138 lb 3.7 oz (62.7 kg) (03/24 0500)  PHYSICAL EXAMINATION: General:  Sedated. Paralyzed. Comfortable.  Neuro:  Sedated, paralyzed, comfortable.   HEENT:  (-) NVD. (+) ET tube.  Cardiovascular:  Tachy rrr no MRG Lungs:  Better ae. Less rhonchi.  Abdomen:  Soft, not tender + bowel sounds  Musculoskeletal:  Intact, good strength and bulk  Skin:  Dry, warm, intact    Recent Labs Lab 03/18/16 0153 03/18/16 1013 03/19/16 0334  NA 139 141 140  K 5.4* 5.5* 5.4*  CL 103 103 102  CO2 30 30 30   BUN 7 9 13   CREATININE 0.53 0.56 0.58  GLUCOSE 126* 121* 109*    Recent Labs Lab 03/17/16 0305 03/18/16 0153 03/19/16 0334  HGB 10.9* 10.5* 10.9*  HCT 34.7* 33.5* 33.6*  WBC 18.6* 20.2* 13.0*  PLT 606* 532* 497*   Dg Chest Port 1 View  03/19/2016  CLINICAL  DATA:  Respiratory failure. EXAM: PORTABLE CHEST 1 VIEW COMPARISON:  03/17/2016.  03/17/2016. FINDINGS: Endotracheal tube and NG tube in stable position. Heart size stable. Dense bilateral pulmonary infiltrates right side greater than left again noted. Slight interim clearing. Tiny bilateral effusions. No pneumothorax. IMPRESSION: 1.  Lines and tubes in stable position. 2. Persistent dense bilateral pulmonary infiltrates, right side greater than left. Slight interim clearing from  prior exam. Tiny bilateral pleural effusions. Electronically Signed   By: Marcello Moores  Register   On: 03/19/2016 07:08   Dg Chest Port 1 View  03/17/2016  CLINICAL DATA:  Evaluate tube placement. EXAM: PORTABLE CHEST 1 VIEW COMPARISON:  Portable film earlier today. FINDINGS: Endotracheal tube has been inserted in good position lying 4.4 cm above the carina. Orogastric tube has also been inserted and is coronal within the stomach. BILATERAL pulmonary opacities appear stable, worse on the RIGHT. IMPRESSION: Satisfactory endotracheal tube and orogastric tube placement. Electronically Signed   By: Staci Righter M.D.   On: 03/17/2016 11:44   Dg Chest Port 1 View  03/17/2016  CLINICAL DATA:  Acute respiratory failure. Lung consolidation. Extensive adenopathy. EXAM: PORTABLE CHEST 1 VIEW COMPARISON:  Chest x-ray and CT scan dated 03/16/2016 FINDINGS: Extensive bilateral lung consolidation/infiltrates persist. Increased density at the left lung apex is felt to represent loculated pleural fluid. Heart size and pulmonary vascularity are normal. Stable slight thoracic scoliosis. IMPRESSION: Persistent extensive bilateral lung consolidation/pulmonary infiltrates. This probably represents a combination of tumor infiltration and pneumonia. New small loculated effusion at the left apex. Electronically Signed   By: Lorriane Shire M.D.   On: 03/17/2016 11:00    ASSESSMENT / PLAN:    PULMONARY A: ARDS/acute hypoxemic respiratory failure secondary to bilateral pneumonia -- R/O infectious cause/post obstructive pna in a setting of (most likely) malignancy. Less likely ILD.  Left upper lung mass, possible malignancy -- Hodgkins Lymphoma (likely) vs Lung CA.  S/P bronchoscopy and bx of LUL mass (3/24) P:   Full vent support. On ARDS protocol. Pt paralyzed 48 hrs >> significantly better with oxygenation. Plan to d/c paralytics today, try to let pt wake up. On 40% FiO2 and sats are 100%. Start PST in am.   Bronch with LUL  fungating mass >> lung CA vs lymphoma >> Bx, flow,cultures,  pathology sent.  Con abx for now; cultures remain (-); may d/c vanc over weekend and complete 7d of cefepime if she continues to improve.   Was on medrol 60 q6 >> will dec to 60 q12 >> i think steroids helped her get better but i decreased it as she was on paralytics and did not want myopahty.   VAP prevention measures. Continue budesonide and Atrovent. Sent CTD/rheumatologic workup to complete w/u   CARDIOVASCULAR A:  Sinus tachycardia>> better.  Pressures stable. Small pericardial effusion P:  Observe blood pressure. bp stable.  Not on pressors.   RENAL A:   Episode of hyperkalemia. P:   Repeat BMP now. Pt with decreasing uo. (+) 2L >> trial with lasix 20 mg IV x 1.  On free water   GASTROINTESTINAL A:   No issues. P:   Cont TF Cont free water   HEMATOLOGIC A:   Patient with left upper lung mass, diffuse lymphadenopathy in the mediastinum, hilum, upper abdomen, right axillary area.  S/P biopsy of right axillary lymph node on 03/16/2016. Cultures (-) so far. Per pathology (Dr. Gari Crown) >> inadequate sample but most likely Hodgkins lymphoma P:  Pathology needs more sample. Plan for  bronch today (LUL mass). Plan for excisional Bx of R axillary LN by surgery this pm. Will send for pathology and flow cytometry.  SCD's / Heparin SQ FAMILY NOT AWARE OF DIAGNOSIS OF CANCER YET. WAITING FOR FINAL PATHOLOGY.    INFECTIOUS A:   Pneumonia. Cultures are negative so far.  (-) HIV. P:   Continue cefepime and vancomycin for now. May d/c vanc over the weekend.   ENDOCRINE A:   No issues P:   Check BS, sliding scale.    NEUROLOGIC A:   Paralyzed. Sedated. P:   D/c paralytics.  Sedation:  propofol gtt / Fentanyl gtt  >> try to lighten sedation.  Plan for wake up trial after procedures.  RASS goal:  -1     Family updated: INo family at bedside.   I spent 35  minutes of critical care time with this  patient today.   03/19/2016, 8:16 AM

## 2016-03-19 NOTE — Transfer of Care (Signed)
Immediate Anesthesia Transfer of Care Note  Patient: Heidi [name]  Procedure(s) Performed: Procedure(s): AXILLARY LYMPH NODE BIOPSY (Right)  Patient Location: PACU and ICU  Anesthesia Type:General  Level of Consciousness: sedated  Airway & Oxygen Therapy: Patient remains intubated per anesthesia plan  Post-op Assessment: Report given to RN and Post -op Vital signs reviewed and stable  Post vital signs: Reviewed and stable  Last Vitals:  Filed Vitals:   03/19/16 1500 03/19/16 1600  BP: 104/62 130/71  Pulse:    Temp:  36.6 C  Resp: 26 26    Complications: No apparent anesthesia complications

## 2016-03-19 NOTE — Op Note (Signed)
Heidi [name], Heidi [name]            ACCOUNT NO.:  [id number]  MEDICAL RECORD NO.:  [id number]  LOCATION:  49                         FACILITY:  [facility]  PHYSICIAN:  Earnstine Regal, MD      DATE OF BIRTH:  11-05-1986  DATE OF PROCEDURE:  03/19/2016                              OPERATIVE REPORT   PREOPERATIVE DIAGNOSIS:  Right axillary lymphadenopathy, rule out lymphoma.  POSTOPERATIVE DIAGNOSIS:  Right axillary lymphadenopathy, rule out lymphoma.  PROCEDURE:  Right axillary lymph node excisional biopsy.  SURGEON:  Earnstine Regal, MD, FACS  ANESTHESIA:  General.  ESTIMATED BLOOD LOSS:  Minimal.  PREPARATION:  Betadine.  COMPLICATIONS:  None.  INDICATIONS:  The patient is a 30 year old female in the intensive care unit at [facility]  The patient has significant axillary adenopathy and mediastinal adenopathy.  Interventional Radiology tried percutaneous biopsy of an axillary lymph node, but did not obtain sufficient tissue for diagnosis.  An axillary lymph node is now requested for pathologic testing.  BODY OF REPORT:  Procedure was done in OR #2 at the [facility].  The patient was brought to the operating room, placed in supine position on the operating room table.  The patient arrived in the operating room, intubated from the intensive care unit. General anesthesia was administered.  After ascertaining that an adequate level of anesthesia had been achieved, the patient was prepped and draped in the usual aseptic fashion.  An incision was made in the right axilla with a #15 blade.  Dissection was carried through subcutaneous tissues and hemostasis achieved with the electrocautery. There were multiple enlarged firm lymph nodes present within the axilla indicative of an underlying pathologic process.  A 3 cm lymph node was selected and mobilized.  It was excised using the electrocautery for hemostasis.  The entire lymph node was removed and  submitted fresh to Pathology for lymphoma workup.  Good hemostasis was noted.  The skin incision was anesthetized with local anesthetic.  Subcutaneous tissues were closed with interrupted 3-0 Vicryl sutures.  Skin was closed with a running 4-0 Monocryl subcuticular suture.  Wound was washed and dried and Dermabond was applied as topical dressing.  The patient was transported from the operating room directly back to the intensive care unit.  The patient tolerated the procedure well.   Earnstine Regal, MD, [facility] Office: [phone]    TMG/MEDQ  D:  03/19/2016  T:  03/19/2016  Job:  [id number]

## 2016-03-19 NOTE — Progress Notes (Deleted)
Hypoglycemic Event  CBG: 46, rechecked on another finger 68  Treatment: 4oz orange juice   Symptoms: None, pt alert and cooperative   Follow-up CBG: Time: 0400 CBG Result:95  Possible Reasons for Event: Change in medication regimen  Comments/MD notified: Hypoglycemia resolved, MD not notified, will continue to monitor and pass on change in blood glucose in report     Heidi [name]

## 2016-03-19 NOTE — Progress Notes (Signed)
eLink Physician-Brief Progress Note Patient Name: Heidi [name] DOB: October 13, 1986 MRN: [id number]   Date of Service  03/19/2016  HPI/Events of Note  Request to reorder Fentanyl IV infusion which has accidentally been D/Ced.   eICU Interventions  Will reorder Fentanyl IV infusion. Titrate to RASS = 0 to -1.      Intervention Category Minor Interventions: Agitation / anxiety - evaluation and management  Sommer,Steven Eugene 03/19/2016, 10:50 PM

## 2016-03-20 DIAGNOSIS — Z789 Other specified health status: Secondary | ICD-10-CM

## 2016-03-20 DIAGNOSIS — D72829 Elevated white blood cell count, unspecified: Secondary | ICD-10-CM

## 2016-03-20 DIAGNOSIS — Z978 Presence of other specified devices: Secondary | ICD-10-CM | POA: Insufficient documentation

## 2016-03-20 LAB — GLUCOSE, CAPILLARY
GLUCOSE-CAPILLARY: 109 mg/dL — AB (ref 65–99)
GLUCOSE-CAPILLARY: 110 mg/dL — AB (ref 65–99)
GLUCOSE-CAPILLARY: 143 mg/dL — AB (ref 65–99)
GLUCOSE-CAPILLARY: 85 mg/dL (ref 65–99)
GLUCOSE-CAPILLARY: 99 mg/dL (ref 65–99)
Glucose-Capillary: 90 mg/dL (ref 65–99)
Glucose-Capillary: 136 mg/dL — ABNORMAL HIGH (ref 65–99)

## 2016-03-20 LAB — CBC
HCT: 32.5 % — ABNORMAL LOW (ref 36.0–46.0)
Hemoglobin: 10.1 g/dL — ABNORMAL LOW (ref 12.0–15.0)
MCH: 29.9 pg (ref 26.0–34.0)
MCHC: 31.1 g/dL (ref 30.0–36.0)
MCV: 96.2 fL (ref 78.0–100.0)
PLATELETS: 563 10*3/uL — AB (ref 150–400)
RBC: 3.38 MIL/uL — AB (ref 3.87–5.11)
RDW: 14.7 % (ref 11.5–15.5)
WBC: 9.8 10*3/uL (ref 4.0–10.5)

## 2016-03-20 LAB — BLOOD GAS, ARTERIAL
ACID-BASE EXCESS: 7.3 mmol/L — AB (ref 0.0–2.0)
Acid-Base Excess: 9.9 mmol/L — ABNORMAL HIGH (ref 0.0–2.0)
BICARBONATE: 33.3 meq/L — AB (ref 20.0–24.0)
BICARBONATE: 35.9 meq/L — AB (ref 20.0–24.0)
DRAWN BY: 103701
Drawn by: 295031
FIO2: 0.4
FIO2: 0.55
LHR: 26 {breaths}/min
O2 SAT: 97.7 %
O2 Saturation: 95.6 %
PEEP: 14 cmH2O
Patient temperature: 98.6
Patient temperature: 98.6
TCO2: 30.8 mmol/L (ref 0–100)
TCO2: 33.2 mmol/L (ref 0–100)
VT: 360 mL
pCO2 arterial: 57.3 mmHg (ref 35.0–45.0)
pCO2 arterial: 58.3 mmHg (ref 35.0–45.0)
pH, Arterial: 7.382 (ref 7.350–7.450)
pH, Arterial: 7.406 (ref 7.350–7.450)
pO2, Arterial: 87.7 mmHg (ref 80.0–100.0)
pO2, Arterial: 104 mmHg — ABNORMAL HIGH (ref 80.0–100.0)

## 2016-03-20 LAB — BASIC METABOLIC PANEL
ANION GAP: 9 (ref 5–15)
BUN: 18 mg/dL (ref 6–20)
CO2: 32 mmol/L (ref 22–32)
Calcium: 8.5 mg/dL — ABNORMAL LOW (ref 8.9–10.3)
Chloride: 103 mmol/L (ref 101–111)
Creatinine, Ser: 0.58 mg/dL (ref 0.44–1.00)
GFR calc Af Amer: >60 mL/min (ref >60)
GLUCOSE: 98 mg/dL (ref 65–99)
POTASSIUM: 4.8 mmol/L (ref 3.5–5.1)
Sodium: 144 mmol/L (ref 135–145)

## 2016-03-20 LAB — MAGNESIUM: MAGNESIUM: 2.4 mg/dL (ref 1.7–2.4)

## 2016-03-20 LAB — TISSUE CULTURE
Culture: NO GROWTH
GRAM STAIN: NONE SEEN
SPECIAL REQUESTS: NORMAL

## 2016-03-20 LAB — TRIGLYCERIDES: TRIGLYCERIDES: 154 mg/dL — AB (ref <150)

## 2016-03-20 LAB — ACID FAST SMEAR (AFB, MYCOBACTERIA): Acid Fast Smear: NEGATIVE

## 2016-03-20 LAB — ACID FAST SMEAR (AFB)

## 2016-03-20 LAB — PNEUMOCYSTIS JIROVECI SMEAR BY DFA: Pneumocystis jiroveci Ag: NEGATIVE

## 2016-03-20 LAB — PHOSPHORUS: PHOSPHORUS: 2.8 mg/dL (ref 2.5–4.6)

## 2016-03-20 MED ORDER — ENOXAPARIN SODIUM 40 MG/0.4ML ~~LOC~~ SOLN
40.0000 mg | SUBCUTANEOUS | Status: DC
  Administered 2016-03-20 – 2016-03-22 (×3): 40 mg via SUBCUTANEOUS
  Filled 2016-03-20 (×4): qty 0.4

## 2016-03-20 MED ORDER — FUROSEMIDE 10 MG/ML IJ SOLN
40.0000 mg | Freq: Two times a day (BID) | INTRAMUSCULAR | Status: DC
  Administered 2016-03-20 – 2016-03-21 (×3): 40 mg via INTRAVENOUS
  Filled 2016-03-20 (×3): qty 4

## 2016-03-20 MED ORDER — DILTIAZEM HCL 30 MG PO TABS
30.0000 mg | ORAL_TABLET | Freq: Four times a day (QID) | ORAL | Status: DC
  Administered 2016-03-20 – 2016-03-21 (×3): 30 mg via ORAL
  Filled 2016-03-20 (×3): qty 1

## 2016-03-20 MED ORDER — POLYETHYLENE GLYCOL 3350 17 G PO PACK
17.0000 g | PACK | Freq: Every day | ORAL | Status: DC
  Filled 2016-03-20: qty 1

## 2016-03-20 MED ORDER — CETYLPYRIDINIUM CHLORIDE 0.05 % MT LIQD
7.0000 mL | Freq: Two times a day (BID) | OROMUCOSAL | Status: DC
  Administered 2016-03-20 – 2016-03-23 (×5): 7 mL via OROMUCOSAL

## 2016-03-20 MED ORDER — IPRATROPIUM BROMIDE 0.02 % IN SOLN
0.5000 mg | Freq: Four times a day (QID) | RESPIRATORY_TRACT | Status: DC
  Administered 2016-03-21 – 2016-03-22 (×4): 0.5 mg via RESPIRATORY_TRACT
  Filled 2016-03-20 (×6): qty 2.5

## 2016-03-20 MED ORDER — LEVALBUTEROL HCL 1.25 MG/0.5ML IN NEBU
1.2500 mg | INHALATION_SOLUTION | Freq: Four times a day (QID) | RESPIRATORY_TRACT | Status: DC
  Administered 2016-03-21 – 2016-03-23 (×8): 1.25 mg via RESPIRATORY_TRACT
  Filled 2016-03-20 (×12): qty 0.5

## 2016-03-20 NOTE — Progress Notes (Signed)
Patient self extubated ETT and OG tube.  Sedation of Diprivan and Fentanyl turned off currently.  Placed on facemask and currently O2 sats are 100%.  Paged Dr. Titus Mould three times without response.  Paged Dr. Lake Bells and he notified RN that he would get in touch with Dr. Titus Mould.  Continue to monitor patient closely.  Amy Roselie Awkward RN

## 2016-03-20 NOTE — Progress Notes (Signed)
Name: Heidi [name] MRN: [id number] DOB: 1986/02/25    ADMISSION DATE:  03/16/2016 CONSULTATION DATE:  3/21  REFERRING MD :  Charlies Silvers   CHIEF COMPLAINT:  Abnormal CT scan   BRIEF PATIENT DESCRIPTION:  This is a 30 year old female w/ no sig medical history. Presented to the ED on w/ cc: cough and resting dyspnea. CT chest obtained showing extensive LUL/RLL consolidative PNA w/ diffuse LAN. PCCM consulted w/ concern for malignancy.   SIGNIFICANT EVENTS  3/20 admitted. PCCM consulted 3/21 Bx of R axillary LN by IR 3/22 intubated for ARDS 3/24 Bronchoscopy done, bx in or axillary node repeat  CULTURES: Sputum ordered>>not done ?  Blood 3/21 >> (-) MRSA 3/21 >> (-) R axillary Bx >> (-) HIV 3/21 >> (-) Influenza 3/21 >> (-)   STUDIES:  2decho (3/23) EF 55%, small pericardial effusion  CT chest 3/21: Extensive adenopathy throughout the hila, mediastinum, and right axillary region as well as in the visualized upper abdominal retroperitoneal regions. Neoplastic etiology highly likely.Widespread pneumonitis bilaterally. There is obstruction of the left upper lobe bronchus, likely due to tumor with postobstructive pneumonitis. There is collapse of the right lower lobe without well-defined endobronchial lesion. More patchy infiltrate is noted elsewhere as noted above. Given the findings in the lungs, bronchoscopy could be most helpful to assess degree of potential tumor versus pneumonitis.No adrenal lesions evident.   HISTORY OF PRESENT ILLNESS:  This is a 30 year old female w/ no sig medical history. Presented to the ED on w/ cc: cough and resting dyspnea. Symptoms started about 2 weeks prior to presentation. Initially w/ nasal congestion and then productive cough w/ green sputum. The cough continued, this was associated w/ increased shortness of breath for which she presented to the ED on 3/21. She denied fever, did have chills. Denied sick exposure, night sweats, sore throat, did  have some chest discomfort w/ cough. On presentation was tachycardic w/ increased RR.  CT chest obtained showing extensive LUL/RLL consolidative PNA w/ diffuse LAN. She was placed on broad spec abx and to be admitted by the IM service. PCCM consulted w/ concern for malignancy.   ANTIBIOTICS : Cefepime (3/21) >> Vanc (3/21) >>   SUBJECTIVE:  Sedated  VITAL SIGNS: Temp:  [97.6 F (36.4 C)-99.6 F (37.6 C)] 99.6 F (37.6 C) (03/25 0400) Pulse Rate:  [80-85] 85 (03/25 0406) Resp:  [19-30] 23 (03/25 0600) BP: (104-165)/(56-97) 126/63 mmHg (03/25 0600) SpO2:  [84 %-100 %] 100 % (03/25 0600) FiO2 (%):  [40 %-50 %] 40 % (03/25 0406) Weight:  [61.2 kg (134 lb 14.7 oz)] 61.2 kg (134 lb 14.7 oz) (03/25 0214)  PHYSICAL EXAMINATION: General: Comfortable.  Neuro:  Sedated rass -1, FC HEENT:  (-) NVD. (+) ET tube  Cardiovascular:  rrr no MRG Lungs:  ronchi rt greater left Abdomen:  Soft, not tender + bowel sounds  Musculoskeletal:  Intact, good strength and bulk  Skin:  Dry, warm, intact    Recent Labs Lab 03/18/16 1013 03/19/16 0334 03/20/16 0320  NA 141 140 144  K 5.5* 5.4* 4.8  CL 103 102 103  CO2 30 30 32  BUN 9 13 18   CREATININE 0.56 0.58 0.58  GLUCOSE 121* 109* 98    Recent Labs Lab 03/18/16 0153 03/19/16 0334 03/20/16 0320  HGB 10.5* 10.9* 10.1*  HCT 33.5* 33.6* 32.5*  WBC 20.2* 13.0* 9.8  PLT 532* 497* 563*   Dg Chest Port 1 View  03/19/2016  CLINICAL DATA:  Shortness of breath and cough, recent bronchoscopy with biopsy EXAM: PORTABLE CHEST 1 VIEW COMPARISON:  03/19/2016 FINDINGS: Cardiac shadow is stable. An endotracheal tube and nasogastric catheter are again seen and stable. Bilateral pulmonary infiltrates are again seen right greater than left. No pneumothorax is noted following bronchoscopy. IMPRESSION: No evidence of post bronchoscopy pneumothorax. Electronically Signed   By: Inez Catalina M.D.   On: 03/19/2016 11:20   Dg Chest Port 1 View  03/19/2016   CLINICAL DATA:  Respiratory failure. EXAM: PORTABLE CHEST 1 VIEW COMPARISON:  03/17/2016.  03/17/2016. FINDINGS: Endotracheal tube and NG tube in stable position. Heart size stable. Dense bilateral pulmonary infiltrates right side greater than left again noted. Slight interim clearing. Tiny bilateral effusions. No pneumothorax. IMPRESSION: 1.  Lines and tubes in stable position. 2. Persistent dense bilateral pulmonary infiltrates, right side greater than left. Slight interim clearing from prior exam. Tiny bilateral pleural effusions. Electronically Signed   By: Marcello Moores  Register   On: 03/19/2016 07:08    ASSESSMENT / PLAN:    PULMONARY A: ARDS/acute hypoxemic respiratory failure secondary to bilateral pneumonia -- R/O infectious cause/post obstructive pna in a setting of (most likely) malignancy. Left upper lung mass, possible malignancy -- Hodgkins Lymphoma (likely) vs Lung CA.  S/P bronchoscopy and bx of LUL mass (3/24) P:   ARDs protocol, keep plat less 30 pcxr repeat in am at risk PTX ABg noted, keep same MV Steroids Rate elevated, repeat abg to ensure not alkalotic, would like to reuce rate If abel and MV reduced then SBT attempt with Freddie Apley,. Consider neg balance  CARDIOVASCULAR A:  Sinus tachycardia>> better.  Small pericardial effusion P:  Observe blood pressure. bp stable.  Even to neg balance  RENAL A:   Episode of hyperkalemia improved ards P:   bmet in am  kvo lasix maintenance  GASTROINTESTINAL A:   No issues. P:   Cont TF Cont free water but may dc , pending am response to lasix Add mir  HEMATOLOGIC A:   Patient with left upper lung mass, diffuse lymphadenopathy in the mediastinum, hilum, upper abdomen, right axillary area.  S/P biopsy of right axillary lymph node on 03/16/2016. Cultures (-) so far. Per pathology (Dr. Gari Crown) >> inadequate sample but most likely Hodgkins lymphoma P:  Pathology needs more sample. Plan for bronch today (LUL mass). Plan  for excisional Bx of R axillary LN by surgery this pm. Will send for pathology and flow cytometry.  SCD's Cancer likely , change sub q hep to lovenox FAMILY NOT AWARE OF DIAGNOSIS OF CANCER YET. WAITING FOR FINAL PATHOLOGY- awaited still  INFECTIOUS A:   Pneumonia. Cultures are negative so far.  (-) HIV. P:   Continue cefepime and vancomycin for now No mrsa, dc vanc  ENDOCRINE A:   No issues P:   Check BS, sliding scale.  Steroids to remain  NEUROLOGIC A:   Paralyzed. Sedated. P:   Sedation:  propofol gtt / Fentanyl gtt  >> try to lighten sedation.  WUA mandatory RASS goal:  -1   Ccm time 30 min  Lavon Paganini. Titus Mould, MD, [place] Pgr: [facility]

## 2016-03-20 NOTE — Procedures (Signed)
Extubation Procedure Note  Patient Details:   Name: ALEEYA DEMBY DOB: 1986-03-26 MRN: [id number]   Airway Documentation:     Evaluation  O2 sats: 98 Complications: none Patient tolerated procedure well. Bilateral Breath Sounds: Clear Suctioning: Airway Able to speak  Pt self extubated  Martha Clan 03/20/2016, 12:39 PM

## 2016-03-20 NOTE — Progress Notes (Signed)
Patient ID: Heidi [name], female   DOB: [birth year], 30 y.o.   MRN: [id number]  [facility]  Subjective: Patient in bed, family at bedside, self-extubated.  Appears comfortable, responsive.  Objective: Vital signs in last 24 hours: Temp:  [97.8 F (36.6 C)-99.6 F (37.6 C)] 99.6 F (37.6 C) (03/25 0400) Pulse Rate:  [80-85] 85 (03/25 0406) Resp:  [19-30] 26 (03/25 0800) BP: (104-159)/(56-93) 159/84 mmHg (03/25 0800) SpO2:  [84 %-100 %] 100 % (03/25 0800) FiO2 (%):  [40 %-50 %] 40 % (03/25 0800) Weight:  [61.2 kg (134 lb 14.7 oz)] 61.2 kg (134 lb 14.7 oz) (03/25 0214) Last BM Date:  (prior to admission)  Intake/Output from previous day: 03/24 0701 - 03/25 0700 In: 3082.4 [I.V.:1905.8; NG/GT:476.7; IV Piggyback:700] Out: 1560 [Urine:1560] Intake/Output this shift: Total I/O In: 120.9 [I.V.:75.9; NG/GT:45] Out: 60 [Urine:60]  Physical Exam: HEENT - sclerae clear, mucous membranes moist Axilla - wound dry and intact with Dermabond in place; mild STS; no drainage   Lab Results:   Recent Labs  03/19/16 0334 03/20/16 0320  WBC 13.0* 9.8  HGB 10.9* 10.1*  HCT 33.6* 32.5*  PLT 497* 563*   BMET  Recent Labs  03/19/16 0334 03/20/16 0320  NA 140 144  K 5.4* 4.8  CL 102 103  CO2 30 32  GLUCOSE 109* 98  BUN 13 18  CREATININE 0.58 0.58  CALCIUM 8.6* 8.5*   PT/INR No results for input(s): LABPROT, INR in the last 72 hours. Comprehensive Metabolic Panel:    Component Value Date/Time   NA 144 03/20/2016 0320   NA 140 03/19/2016 0334   K 4.8 03/20/2016 0320   K 5.4* 03/19/2016 0334   CL 103 03/20/2016 0320   CL 102 03/19/2016 0334   CO2 32 03/20/2016 0320   CO2 30 03/19/2016 0334   BUN 18 03/20/2016 0320   BUN 13 03/19/2016 0334   CREATININE 0.58 03/20/2016 0320   CREATININE 0.58 03/19/2016 0334   GLUCOSE 98 03/20/2016 0320   GLUCOSE 109* 03/19/2016 0334   CALCIUM 8.5* 03/20/2016 0320   CALCIUM 8.6* 03/19/2016 0334    AST 13* 03/18/2016 1013   AST 23 02/22/2014 2049   ALT 15 03/18/2016 1013   ALT 15 02/22/2014 2049   ALKPHOS 57 03/18/2016 1013   ALKPHOS 73 02/22/2014 2049   BILITOT 0.5 03/18/2016 1013   BILITOT 0.4 02/22/2014 2049   PROT 4.9* 03/18/2016 1013   PROT 7.4 02/22/2014 2049   ALBUMIN 2.1* 03/18/2016 1013   ALBUMIN 4.0 02/22/2014 2049    Studies/Results: Dg Chest Port 1 View  03/19/2016  CLINICAL DATA:  Shortness of breath and cough, recent bronchoscopy with biopsy EXAM: PORTABLE CHEST 1 VIEW COMPARISON:  03/19/2016 FINDINGS: Cardiac shadow is stable. An endotracheal tube and nasogastric catheter are again seen and stable. Bilateral pulmonary infiltrates are again seen right greater than left. No pneumothorax is noted following bronchoscopy. IMPRESSION: No evidence of post bronchoscopy pneumothorax. Electronically Signed   By: Inez Catalina M.D.   On: 03/19/2016 11:20   Dg Chest Port 1 View  03/19/2016  CLINICAL DATA:  Respiratory failure. EXAM: PORTABLE CHEST 1 VIEW COMPARISON:  03/17/2016.  03/17/2016. FINDINGS: Endotracheal tube and NG tube in stable position. Heart size stable. Dense bilateral pulmonary infiltrates right side greater than left again noted. Slight interim clearing. Tiny bilateral effusions. No pneumothorax. IMPRESSION: 1.  Lines and tubes in stable position. 2. Persistent dense bilateral pulmonary infiltrates, right side greater than  left. Slight interim clearing from prior exam. Tiny bilateral pleural effusions. Electronically Signed   By: Marcello Moores  Register   On: 03/19/2016 07:08    Assessment & Plans: Right axillary lymphadenopathy  Excisional node biopsy performed yesterday  Path pending  No apparent complications  Will sign off - call if needed  Earnstine Regal, MD, [facility] Office: [place] 03/20/2016

## 2016-03-20 NOTE — Progress Notes (Signed)
eLink Physician-Brief Progress Note Patient Name: Heidi [name] DOB: 22-May-1986 MRN: [id number]   Date of Service  03/20/2016  HPI/Events of Note  Called by nurse for HTN and mild tachycardia.  Patient self extubated and has done well off vent.   eICU Interventions  Diltiazem 30 mg every 6 hrs     Intervention Category Intermediate Interventions: Hypertension - evaluation and management  Mauri Brooklyn, P 03/20/2016, 3:46 PM

## 2016-03-20 NOTE — Progress Notes (Signed)
Patient with HR in 150's-160's.  O2 sats dropped to the 80's but no increase work of breathing.  The patient states she feels fine and not short of breath.  Placed back on 55% ventimask and O2 sats increased to 99-100%, and HR decreased to 106/min.  Continue to monitor patient closely and decrease oxygen as tolerated.  Saabir Blyth Roselie Awkward RN

## 2016-03-20 NOTE — Telephone Encounter (Signed)
Thanks for update

## 2016-03-20 NOTE — Progress Notes (Signed)
230 cc of Fentanyl drip wasted in sink and observed by Lacinda Axon RN, and [employer].

## 2016-03-21 ENCOUNTER — Inpatient Hospital Stay (HOSPITAL_COMMUNITY): Payer: Self-pay

## 2016-03-21 LAB — GLUCOSE, CAPILLARY
GLUCOSE-CAPILLARY: 108 mg/dL — AB (ref 65–99)
GLUCOSE-CAPILLARY: 118 mg/dL — AB (ref 65–99)
GLUCOSE-CAPILLARY: 175 mg/dL — AB (ref 65–99)
Glucose-Capillary: 103 mg/dL — ABNORMAL HIGH (ref 65–99)
Glucose-Capillary: 133 mg/dL — ABNORMAL HIGH (ref 65–99)
Glucose-Capillary: 128 mg/dL — ABNORMAL HIGH (ref 65–99)

## 2016-03-21 LAB — BLOOD GAS, ARTERIAL
ACID-BASE EXCESS: 13.5 mmol/L — AB (ref 0.0–2.0)
Bicarbonate: 38.9 mEq/L — ABNORMAL HIGH (ref 20.0–24.0)
Drawn by: 236041
O2 CONTENT: 4 L/min
O2 SAT: 93.9 %
PATIENT TEMPERATURE: 98.6
PO2 ART: 72.7 mmHg — AB (ref 80.0–100.0)
TCO2: 35.4 mmol/L (ref 0–100)
pCO2 arterial: 53.6 mmHg — ABNORMAL HIGH (ref 35.0–45.0)
pH, Arterial: 7.474 — ABNORMAL HIGH (ref 7.350–7.450)

## 2016-03-21 LAB — CBC
HCT: 32.3 % — ABNORMAL LOW (ref 36.0–46.0)
Hemoglobin: 10.6 g/dL — ABNORMAL LOW (ref 12.0–15.0)
MCH: 30.4 pg (ref 26.0–34.0)
MCHC: 32.8 g/dL (ref 30.0–36.0)
MCV: 92.6 fL (ref 78.0–100.0)
PLATELETS: 557 10*3/uL — AB (ref 150–400)
RBC: 3.49 MIL/uL — AB (ref 3.87–5.11)
RDW: 14 % (ref 11.5–15.5)
WBC: 12.8 10*3/uL — AB (ref 4.0–10.5)

## 2016-03-21 LAB — CULTURE, BAL-QUANTITATIVE: CULTURE: NO GROWTH

## 2016-03-21 LAB — BASIC METABOLIC PANEL
ANION GAP: 9 (ref 5–15)
BUN: 15 mg/dL (ref 6–20)
CO2: 37 mmol/L — ABNORMAL HIGH (ref 22–32)
Calcium: 8.6 mg/dL — ABNORMAL LOW (ref 8.9–10.3)
Chloride: 95 mmol/L — ABNORMAL LOW (ref 101–111)
Creatinine, Ser: 0.45 mg/dL (ref 0.44–1.00)
Glucose, Bld: 109 mg/dL — ABNORMAL HIGH (ref 65–99)
POTASSIUM: 3.9 mmol/L (ref 3.5–5.1)
Sodium: 141 mmol/L (ref 135–145)

## 2016-03-21 LAB — CULTURE, BLOOD (ROUTINE X 2)
CULTURE: NO GROWTH
Culture: NO GROWTH

## 2016-03-21 LAB — CULTURE, BAL-QUANTITATIVE W GRAM STAIN: Colony Count: NO GROWTH

## 2016-03-21 LAB — PHOSPHORUS: PHOSPHORUS: 2.4 mg/dL — AB (ref 2.5–4.6)

## 2016-03-21 LAB — MAGNESIUM: MAGNESIUM: 2 mg/dL (ref 1.7–2.4)

## 2016-03-21 MED ORDER — METHYLPREDNISOLONE SODIUM SUCC 40 MG IJ SOLR
40.0000 mg | Freq: Two times a day (BID) | INTRAMUSCULAR | Status: DC
  Administered 2016-03-21 – 2016-03-22 (×2): 40 mg via INTRAVENOUS
  Filled 2016-03-21 (×2): qty 1

## 2016-03-21 MED ORDER — LABETALOL HCL 5 MG/ML IV SOLN
10.0000 mg | INTRAVENOUS | Status: DC | PRN
  Filled 2016-03-21: qty 4

## 2016-03-21 MED ORDER — LABETALOL HCL 100 MG PO TABS
100.0000 mg | ORAL_TABLET | Freq: Two times a day (BID) | ORAL | Status: DC
  Administered 2016-03-21 – 2016-03-23 (×5): 100 mg via ORAL
  Filled 2016-03-21 (×5): qty 1

## 2016-03-21 MED ORDER — GUAIFENESIN-CODEINE 100-10 MG/5ML PO SOLN
5.0000 mL | ORAL | Status: DC | PRN
  Administered 2016-03-21: 5 mL via ORAL
  Filled 2016-03-21: qty 5

## 2016-03-21 MED ORDER — FUROSEMIDE 10 MG/ML IJ SOLN
20.0000 mg | Freq: Two times a day (BID) | INTRAMUSCULAR | Status: AC
  Administered 2016-03-21: 20 mg via INTRAVENOUS
  Filled 2016-03-21: qty 2

## 2016-03-21 NOTE — Progress Notes (Signed)
Name: Heidi [name] MRN: [id number] DOB: 1986/10/15    ADMISSION DATE:  03/16/2016 CONSULTATION DATE:  3/21  REFERRING MD :  Charlies Silvers   CHIEF COMPLAINT:  Abnormal CT scan   BRIEF PATIENT DESCRIPTION:  This is a 30 year old female w/ no sig medical history. Presented to the ED on w/ cc: cough and resting dyspnea. CT chest obtained showing extensive LUL/RLL consolidative PNA w/ diffuse LAN. PCCM consulted w/ concern for malignancy.   SIGNIFICANT EVENTS  3/20 admitted. PCCM consulted 3/21 Bx of R axillary LN by IR 3/22 intubated for ARDS 3/24 Bronchoscopy done, bx in or axillary node repeat 3/25- self extubated  CULTURES: Sputum ordered>>not done ?  Blood 3/21 >> (-) MRSA 3/21 >> (-) R axillary Bx >> (-) HIV 3/21 >> (-) Influenza 3/21 >> (-)   STUDIES:  2decho (3/23) EF 55%, small pericardial effusion  CT chest 3/21: Extensive adenopathy throughout the hila, mediastinum, and right axillary region as well as in the visualized upper abdominal retroperitoneal regions. Neoplastic etiology highly likely.Widespread pneumonitis bilaterally. There is obstruction of the left upper lobe bronchus, likely due to tumor with postobstructive pneumonitis. There is collapse of the right lower lobe without well-defined endobronchial lesion. More patchy infiltrate is noted elsewhere as noted above. Given the findings in the lungs, bronchoscopy could be most helpful to assess degree of potential tumor versus pneumonitis.No adrenal lesions evident.   HISTORY OF PRESENT ILLNESS:  This is a 30 year old female w/ no sig medical history. Presented to the ED on w/ cc: cough and resting dyspnea. Symptoms started about 2 weeks prior to presentation. Initially w/ nasal congestion and then productive cough w/ green sputum. The cough continued, this was associated w/ increased shortness of breath for which she presented to the ED on 3/21. She denied fever, did have chills. Denied sick exposure, night  sweats, sore throat, did have some chest discomfort w/ cough. On presentation was tachycardic w/ increased RR.  CT chest obtained showing extensive LUL/RLL consolidative PNA w/ diffuse LAN. She was placed on broad spec abx and to be admitted by the IM service. PCCM consulted w/ concern for malignancy.   ANTIBIOTICS : Cefepime (3/21) >> Vanc (3/21) >>   SUBJECTIVE:  In chair no distress Neg 4.6 liters HTN  VITAL SIGNS: Temp:  [97.8 F (36.6 C)-98.9 F (37.2 C)] 98.6 F (37 C) (03/26 0800) Resp:  [11-34] 34 (03/26 0800) BP: (143-180)/(67-102) 174/67 mmHg (03/26 0800) SpO2:  [96 %-100 %] 97 % (03/26 0800) FiO2 (%):  [55 %] 55 % (03/25 1643) Weight:  [58.1 kg (128 lb 1.4 oz)] 58.1 kg (128 lb 1.4 oz) (03/26 0500)  PHYSICAL EXAMINATION: General: Comfortable with breathing Neuro: awake, nonfocal examination HEENT:  jvd wnl Cardiovascular:  rrr no MRG Lungs:  ronchi mild  Abdomen:  Soft, not tender + bowel sounds  Musculoskeletal:  Intact, good strength and bulk  Skin:  Dry, warm, intact    Recent Labs Lab 03/19/16 0334 03/20/16 0320 03/21/16 0307  NA 140 144 141  K 5.4* 4.8 3.9  CL 102 103 95*  CO2 30 32 37*  BUN 13 18 15   CREATININE 0.58 0.58 0.45  GLUCOSE 109* 98 109*    Recent Labs Lab 03/19/16 0334 03/20/16 0320 03/21/16 0307  HGB 10.9* 10.1* 10.6*  HCT 33.6* 32.5* 32.3*  WBC 13.0* 9.8 12.8*  PLT 497* 563* 557*   Dg Chest Port 1 View  03/21/2016  CLINICAL DATA:  30 year old female with ARDS  and asthma. EXAM: PORTABLE CHEST 1 VIEW COMPARISON:  Radiograph dated 03/19/2016 FINDINGS: Single-view of the chest demonstrates a large area of airspace opacity in the right mid lung field as well as a smaller area of opacity in the left suprahilar region. Smaller nodular ground-glass opacity noted in the left mid lung field. Overall the lung opacities appear more confluent on this study compared to the prior study. There is no pleural effusion or pneumothorax. The cardiac  silhouette is within normal limits. No acute osseous pathology identified. IMPRESSION: Bilateral airspace opacities, right greater than left. Follow-up recommended. Electronically Signed   By: Anner Crete M.D.   On: 03/21/2016 05:16   Dg Chest Port 1 View  03/19/2016  CLINICAL DATA:  Shortness of breath and cough, recent bronchoscopy with biopsy EXAM: PORTABLE CHEST 1 VIEW COMPARISON:  03/19/2016 FINDINGS: Cardiac shadow is stable. An endotracheal tube and nasogastric catheter are again seen and stable. Bilateral pulmonary infiltrates are again seen right greater than left. No pneumothorax is noted following bronchoscopy. IMPRESSION: No evidence of post bronchoscopy pneumothorax. Electronically Signed   By: Inez Catalina M.D.   On: 03/19/2016 11:20    ASSESSMENT / PLAN:  PULMONARY A: ARDS/acute hypoxemic respiratory failure secondary to bilateral pneumonia -- R/O infectious cause/post obstructive pna in a setting of (most likely) malignancy. Left upper lung mass, possible malignancy -- Hodgkins Lymphoma (likely) vs Lung CA.  S/P bronchoscopy and bx of LUL mass (3/24) P:   pcxr follow up in am IS  ABG reviewed, no need repeat  CARDIOVASCULAR A:  Sinus tachycardia Small pericardial effusion HTN P:  Tele Neg balance remain Not responding well to cardizem, change to labetolol Add IV prn labetolol also  RENAL A:   Episode of hyperkalemia improved ards P:   bmet in am  Assess am phos, mag kvo lasix maintenance, reduce slight  GASTROINTESTINAL A:   Diet P:   diet  HEMATOLOGIC A:   Patient with left upper lung mass, diffuse lymphadenopathy in the mediastinum, hilum, upper abdomen, right axillary area.  S/P biopsy of right axillary lymph node on 03/16/2016. Cultures (-) so far. Per pathology (Dr. Gari Crown) >> inadequate sample but most likely Hodgkins lymphoma P:  SCD's lovenox keep FAMILY NOT AWARE OF DIAGNOSIS OF CANCER YET. WAITING FOR FINAL PATHOLOGY- awaited  still  INFECTIOUS A:   Pneumonia. Cultures are negative so far.  (-) HIV. P:   Continue current abx No mrsa, dc vanc  ENDOCRINE A:   No issues P:   Check BS, sliding scale.  Steroid redcution  NEUROLOGIC A:   Pain control P:   Pain treatment   Lavon Paganini. Titus Mould, MD, [place] Pgr: [facility]

## 2016-03-21 NOTE — Anesthesia Postprocedure Evaluation (Signed)
Anesthesia Post Note  Patient: Heidi [name]  Procedure(s) Performed: Procedure(s) (LRB): AXILLARY LYMPH NODE BIOPSY (Right)  Patient location during evaluation: ICU Anesthesia Type: General Level of consciousness: awake and alert, patient cooperative and oriented Pain management: pain level controlled Vital Signs Assessment: post-procedure vital signs reviewed and stable Respiratory status: spontaneous breathing, nonlabored ventilation, respiratory function stable and patient connected to nasal cannula oxygen Cardiovascular status: blood pressure returned to baseline and stable Postop Assessment: no signs of nausea or vomiting Anesthetic complications: no    Last Vitals:  Filed Vitals:   03/21/16 1000 03/21/16 1152  BP: 160/92 146/91  Pulse:    Temp:    Resp: 22 24    Last Pain:  Filed Vitals:   03/21/16 1152  PainSc: 0-No pain                 Giovannie Scerbo,E. Nancey Kreitz

## 2016-03-22 ENCOUNTER — Inpatient Hospital Stay (HOSPITAL_COMMUNITY): Payer: MEDICAID

## 2016-03-22 ENCOUNTER — Encounter (HOSPITAL_COMMUNITY): Payer: Self-pay | Admitting: Surgery

## 2016-03-22 LAB — GLUCOSE, CAPILLARY
Glucose-Capillary: 141 mg/dL — ABNORMAL HIGH (ref 65–99)
Glucose-Capillary: 101 mg/dL — ABNORMAL HIGH (ref 65–99)
Glucose-Capillary: 107 mg/dL — ABNORMAL HIGH (ref 65–99)

## 2016-03-22 LAB — CBC WITH DIFFERENTIAL/PLATELET
BASOS ABS: 0 10*3/uL (ref 0.0–0.1)
BASOS PCT: 0 %
Eosinophils Absolute: 0 10*3/uL (ref 0.0–0.7)
Eosinophils Relative: 0 %
HEMATOCRIT: 34.3 % — AB (ref 36.0–46.0)
HEMOGLOBIN: 11.2 g/dL — AB (ref 12.0–15.0)
LYMPHS PCT: 3 %
Lymphs Abs: 0.4 10*3/uL — ABNORMAL LOW (ref 0.7–4.0)
MCH: 29.2 pg (ref 26.0–34.0)
MCHC: 32.7 g/dL (ref 30.0–36.0)
MCV: 89.6 fL (ref 78.0–100.0)
MONO ABS: 0.6 10*3/uL (ref 0.1–1.0)
Monocytes Relative: 5 %
NEUTROS ABS: 11.4 10*3/uL — AB (ref 1.7–7.7)
NEUTROS PCT: 92 %
Platelets: 577 10*3/uL — ABNORMAL HIGH (ref 150–400)
RBC: 3.83 MIL/uL — AB (ref 3.87–5.11)
RDW: 13.6 % (ref 11.5–15.5)
WBC: 12.3 10*3/uL — ABNORMAL HIGH (ref 4.0–10.5)

## 2016-03-22 LAB — BASIC METABOLIC PANEL
ANION GAP: 8 (ref 5–15)
BUN: 11 mg/dL (ref 6–20)
CALCIUM: 8.8 mg/dL — AB (ref 8.9–10.3)
CO2: 37 mmol/L — ABNORMAL HIGH (ref 22–32)
Chloride: 95 mmol/L — ABNORMAL LOW (ref 101–111)
Creatinine, Ser: 0.45 mg/dL (ref 0.44–1.00)
GLUCOSE: 107 mg/dL — AB (ref 65–99)
POTASSIUM: 3.4 mmol/L — AB (ref 3.5–5.1)
Sodium: 140 mmol/L (ref 135–145)

## 2016-03-22 LAB — MAGNESIUM: Magnesium: 1.8 mg/dL (ref 1.7–2.4)

## 2016-03-22 LAB — SEDIMENTATION RATE: Sed Rate: 18 mm/hr (ref 0–22)

## 2016-03-22 LAB — PHOSPHORUS: PHOSPHORUS: 2.7 mg/dL (ref 2.5–4.6)

## 2016-03-22 SURGERY — Surgical Case
Anesthesia: *Unknown

## 2016-03-22 MED ORDER — POTASSIUM CHLORIDE CRYS ER 20 MEQ PO TBCR
40.0000 meq | EXTENDED_RELEASE_TABLET | Freq: Once | ORAL | Status: AC
  Administered 2016-03-22: 40 meq via ORAL
  Filled 2016-03-22: qty 2

## 2016-03-22 MED ORDER — AMOXICILLIN-POT CLAVULANATE 875-125 MG PO TABS
1.0000 | ORAL_TABLET | Freq: Two times a day (BID) | ORAL | Status: DC
Start: 2016-03-22 — End: 2016-03-23
  Administered 2016-03-22 – 2016-03-23 (×3): 1 via ORAL
  Filled 2016-03-22 (×3): qty 1

## 2016-03-22 NOTE — Progress Notes (Signed)
Date:  March 22, 2016 Chart reviewed for concurrent status and case management needs. Will continue to follow patient for changes and needs: transferred to med surg floor from [place], [place], Therapist, sports, [place]

## 2016-03-22 NOTE — Addendum Note (Signed)
Addendum  created 03/22/16 0741 by Lollie Sails, CRNA   Modules edited: Charges VN

## 2016-03-22 NOTE — Progress Notes (Signed)
Name: Heidi [name] MRN: [id number] DOB: Mar 23, 1986    ADMISSION DATE:  03/16/2016 CONSULTATION DATE:  3/21  REFERRING MD :  Charlies Silvers   CHIEF COMPLAINT:  Abnormal CT scan   BRIEF PATIENT DESCRIPTION:  This is a 30 year old female w/ no sig medical history. Presented to the ED on w/ cc: cough and resting dyspnea. CT chest obtained showing extensive LUL/RLL consolidative PNA w/ diffuse LAN. PCCM consulted w/ concern for malignancy.   SIGNIFICANT EVENTS  3/20 admitted. PCCM consulted 3/21 Bx of R axillary LN by IR 3/22 intubated for ARDS 3/24 Bronchoscopy done, bx in or axillary node repeat 3/25- self extubated  CULTURES: Sputum ordered>>not done ?  Blood 3/21 >> (-) MRSA 3/21 >> (-) R axillary Bx >> (-) HIV 3/21 >> (-) Influenza 3/21 >> (-)   STUDIES:  2decho (3/23) EF 55%, small pericardial effusion  CT chest 3/21: Extensive adenopathy throughout the hila, mediastinum, and right axillary region as well as in the visualized upper abdominal retroperitoneal regions. Neoplastic etiology highly likely.Widespread pneumonitis bilaterally. There is obstruction of the left upper lobe bronchus, likely due to tumor with postobstructive pneumonitis. There is collapse of the right lower lobe without well-defined endobronchial lesion. More patchy infiltrate is noted elsewhere as noted above. Given the findings in the lungs, bronchoscopy could be most helpful to assess degree of potential tumor versus pneumonitis.No adrenal lesions evident.  ANTIBIOTICS : Cefepime (3/21) >> Vanc (3/21) >> d/cd  SUBJECTIVE:  In chair no distress Neg 4.6 liters HTN  VITAL SIGNS: Temp:  [98.1 F (36.7 C)-98.5 F (36.9 C)] 98.2 F (36.8 C) (03/27 0800) Resp:  [22-42] 39 (03/27 0842) BP: (126-158)/(73-102) 149/85 mmHg (03/27 0842) SpO2:  [91 %-100 %] 97 % (03/27 0842) Weight:  [121 lb 4.1 oz (55 kg)] 121 lb 4.1 oz (55 kg) (03/27 0500)  PHYSICAL EXAMINATION: General: Comfortable with  breathing Neuro: awake, nonfocal examination HEENT:  jvd wnl, MMM Cardiovascular:  rrr no MRG Lungs: crackles bases. Exp wheeze Abdomen:  Soft, not tender + bowel sounds  Musculoskeletal:  Intact, good strength and bulk  Skin:  Dry, warm, intact    Recent Labs Lab 03/20/16 0320 03/21/16 0307 03/22/16 0347  NA 144 141 140  K 4.8 3.9 3.4*  CL 103 95* 95*  CO2 32 37* 37*  BUN 18 15 11   CREATININE 0.58 0.45 0.45  GLUCOSE 98 109* 107*    Recent Labs Lab 03/20/16 0320 03/21/16 0307 03/22/16 0347  HGB 10.1* 10.6* 11.2*  HCT 32.5* 32.3* 34.3*  WBC 9.8 12.8* 12.3*  PLT 563* 557* 577*   Dg Chest Port 1 View  03/22/2016  CLINICAL DATA:  Cough, dyspnea at rest, bilateral parenchymal consolidation, known pneumonia. EXAM: PORTABLE CHEST 1 VIEW COMPARISON:  Portable chest x-ray of March 21, 2016 FINDINGS: The left lung is well-expanded. A large left hilar mass is stable. On the right confluent alveolar opacity occupies approximately 1/3 to 1/2 of the lung volume. This appears stable and is centered in the perihilar and infrahilar regions. The interstitial markings of both lungs are mildly prominent. The cardiac silhouette is top-normal in size. The pulmonary vascularity is not clearly engorged. There is no pleural effusion or pneumothorax. The trachea is midline. The observed bony thorax exhibits no acute abnormality. IMPRESSION: Persistent bilateral parenchymal opacities not greatly changed from the earlier study. The findings are consistent with known malignancy with lymphadenopathy and postobstructive atelectatic changes especially on the right. Electronically Signed   By: David  Martinique  M.D.   On: 03/22/2016 07:08   Dg Chest Port 1 View  03/21/2016  CLINICAL DATA:  30 year old female with ARDS and asthma. EXAM: PORTABLE CHEST 1 VIEW COMPARISON:  Radiograph dated 03/19/2016 FINDINGS: Single-view of the chest demonstrates a large area of airspace opacity in the right mid lung field as well as  a smaller area of opacity in the left suprahilar region. Smaller nodular ground-glass opacity noted in the left mid lung field. Overall the lung opacities appear more confluent on this study compared to the prior study. There is no pleural effusion or pneumothorax. The cardiac silhouette is within normal limits. No acute osseous pathology identified. IMPRESSION: Bilateral airspace opacities, right greater than left. Follow-up recommended. Electronically Signed   By: Anner Crete M.D.   On: 03/21/2016 05:16    ASSESSMENT / PLAN:   ARDS/acute hypoxemic respiratory failure in setting of post obstructive pna Left upper lung mass, possible malignancy -- Hodgkins Lymphoma (likely) vs Lung CA.  S/P bronchoscopy and bx of LUL mass (3/24) -->Cultures (-) so far. Per pathology (Dr. Gari Crown) >> inadequate sample but most likely Hodgkins lymphoma --> surgical bx (pending from 3/24)->morpholgy supporting Hodgkins but pathology wants to perform further testing and anticipates final results mid-day 3/28  Plan:  Cont SCD's & lovenox Change abx to augmentin. Complete 10d total Will go ahead and ask Heme/onc to see.  FAMILY NOT AWARE OF DIAGNOSIS OF CANCER YET. WAITING FOR FINAL PATHOLOGY- awaited still D/c foley Mobilize  Transfer to med/surg   Small pericardial effusion HTN P:  Cont labetolol  Hypokalemia P: Replace and recheck  Erick Colace ACNP-BC [place] Pager # [phone] OR # [phone] if no answer  Attending Note:  I have examined patient, reviewed labs, studies and notes. I have discussed the case with Jerrye Bushy, and I agree with the data and plans as amended above. Suspect that this is lymphoma, await confirmatory stains, 3/28.  We will transfer out of ICU, simplify her abx, ask H/O to evaluate her to plan next steps in therapy.   Baltazar Apo, MD, PhD 03/22/2016, 10:48 AM [facility] [phone] or if no answer [phone]

## 2016-03-22 NOTE — Discharge Summary (Signed)
Physician Discharge Summary       Patient ID: Heidi [name] MRN: [id number] DOB/AGE: 1986/03/18 30 y.o.  Admit date: 03/16/2016 Discharge date: 03/23/2016  Discharge Diagnoses:   Acute hypoxic respiratory failure  Post-obstructive Pneumonia  Sepsis (normal lactic acid) Lung mass  Anemia of critical illness Wide spread LAN Small pericardial effusion   Detailed Hospital Course:   This is a 30 year old female w/ no sig medical history. Presented to the ED on w/ cc: cough and resting dyspnea. Symptoms started about 2 weeks prior to presentation. Initially w/ nasal congestion and then productive cough w/ green sputum. The cough continued, this was associated w/ increased shortness of breath for which she presented to the ED on 3/21. She denied fever, did have chills. Denied sick exposure, night sweats, sore throat, did have some chest discomfort w/ cough. On presentation was tachycardic w/ increased RR. CT chest obtained showing extensive LUL/RLL consolidative PNA w/ diffuse LAN. She was placed on broad spec abx and to be admitted by the IM service. PCCM consulted w/ concern for malignancy. She was admitted to the step-down unit setting. Cultures were obtained, she was administered oxygen and empiric antibiotics. She had a palpable right axillary Lymph node so we consulted interventional radiology for needle biopsy. This was carried out on 3/21. The results were suspicious for Hodgkin's Lymphoma but Not diagnostic. Her hospital course was complicated by worsening acute on chronic respiratory failure which required intubation on 3/22. She was treated supportively w/ heavy IV sedation, continued antibiotics and mechanical ventilation. She underwent surgical biopsy of the right axillary lymph node on 3/24 and bronchoscopy. The results from the LN  & the path and cytology from the bronchoscopy remain pending at time of discharge.  She self extubated on 3/26. Did well following this and we were  actually able to wean her Oxygen support to off. Heme onc was consulted on 3/27, she was also changed to oral antibiotics on that day. She was deemed ready for discharge as of  3/28 and was sent home w/ the following plan of care as outlined below.    Discharge Plan by active problems  ARDS/acute hypoxemic respiratory failure in setting of post obstructive pna Left upper lung mass, possible malignancy.Hodgkins Lymphoma (likely) vs Lung CA.  Plan:  Changed abx to augmentin. Complete 10d  total F/u w/ Dr Irene Limbo in 1 week to arrange PET scan and to determine further plan of care.    [facility] tests/ studies  Consults: Interventional radiology, general surgery, Oncology.   Discharge Exam: BP 134/77 mmHg  Pulse 90  Temp(Src) 98.7 F (37.1 C) (Oral)  Resp 18  Ht 5\' 6"  (1.676 m)  Wt 128 lb 8 oz (58.287 kg)  BMI 20.75 kg/m2  SpO2 96%  LMP 01/11/2016 (Approximate)  General: Comfortable with breathing Neuro: awake, nonfocal examination HEENT: jvd wnl, MMM Cardiovascular: rrr no MRG Lungs: crackles bases. Exp wheeze Abdomen: Soft, not tender + bowel sounds  Musculoskeletal: Intact, good strength and bulk  Skin: Dry, warm, intact   Labs at discharge Lab Results  Component Value Date   CREATININE 0.45 03/22/2016   BUN 11 03/22/2016   NA 140 03/22/2016   K 3.4* 03/22/2016   CL 95* 03/22/2016   CO2 37* 03/22/2016   Lab Results  Component Value Date   WBC 12.3* 03/22/2016   HGB 11.2* 03/22/2016   HCT 34.3* 03/22/2016   MCV 89.6 03/22/2016   PLT 577* 03/22/2016   Lab Results  Component Value Date   ALT 15 03/18/2016   AST 13* 03/18/2016   ALKPHOS 57 03/18/2016   BILITOT 0.5 03/18/2016   Lab Results  Component Value Date   INR 1.21 03/16/2016    Current radiology studies Dg Chest Port 1 View  03/22/2016  CLINICAL DATA:  Cough, dyspnea at rest, bilateral parenchymal consolidation, known pneumonia. EXAM: PORTABLE CHEST 1 VIEW COMPARISON:  Portable  chest x-ray of March 21, 2016 FINDINGS: The left lung is well-expanded. A large left hilar mass is stable. On the right confluent alveolar opacity occupies approximately 1/3 to 1/2 of the lung volume. This appears stable and is centered in the perihilar and infrahilar regions. The interstitial markings of both lungs are mildly prominent. The cardiac silhouette is top-normal in size. The pulmonary vascularity is not clearly engorged. There is no pleural effusion or pneumothorax. The trachea is midline. The observed bony thorax exhibits no acute abnormality. IMPRESSION: Persistent bilateral parenchymal opacities not greatly changed from the earlier study. The findings are consistent with known malignancy with lymphadenopathy and postobstructive atelectatic changes especially on the right. Electronically Signed   By: David  Martinique M.D.   On: 03/22/2016 07:08    Disposition:  01-Home or Self Care      Discharge Instructions    Diet - low sodium heart healthy    Complete by:  As directed      Increase activity slowly    Complete by:  As directed             Medication List    TAKE these medications        amoxicillin-clavulanate 875-125 MG tablet  Commonly known as:  AUGMENTIN  Take 1 tablet by mouth every 12 (twelve) hours.     guaiFENesin 600 MG 12 hr tablet  Commonly known as:  MUCINEX  Take 1,200 mg by mouth 2 (two) times daily as needed for cough or to loosen phlegm.     labetalol 100 MG tablet  Commonly known as:  NORMODYNE  Take 1 tablet (100 mg total) by mouth 2 (two) times daily.     levalbuterol 45 MCG/ACT inhaler  Commonly known as:  XOPENEX HFA  Inhale 2 puffs into the lungs every 6 (six) hours as needed for wheezing.       Follow-up Information    Follow up with Please use the resources provided to you in emergency room by case manager to assist with doctor for follow up .   Why:  A referral for you has been sent to [facility] if you have  not received a call in 3 days you may contact them Call Sylvie Farrier at [place] [email or web address]   Contact information:   These [place] uninsured resources provide possible primary care providers, resources for discounted medications, housing, dental resources, affordable care act information, plus other resources for [employer]        Follow up with Sullivan Lone, MD. Schedule an appointment as soon as possible for a visit in 1 week.   Specialties:  Hematology, Oncology   Why:  If you do not hear from Dr Grier Mitts office call above.    Contact information:   [place] [id number] [phone]       Discharged Condition: good  Physician Statement:   The Patient was personally examined, the discharge assessment and plan has been personally reviewed and I agree with ACNP Babcock's assessment and plan. > 30 minutes of  time have been dedicated to discharge assessment, planning and discharge instructions.   Signed: Clementeen Graham 03/23/2016, 12:09 PM   Attending Note:  I have examined patient, reviewed labs, studies and notes. I have discussed the case with Jerrye Bushy, and I agree with the data and plans as amended above.   Baltazar Apo, MD, PhD 03/23/2016, 5:36 PM [facility] [phone] or if no answer [phone]

## 2016-03-22 NOTE — Consult Note (Addendum)
[name] Kitchen    HEMATOLOGY/ONCOLOGY CONSULTATION NOTE  Date of Service: 03/22/2016  Attending: .Jose Shirl Harris, MD  CHIEF COMPLAINTS/PURPOSE OF CONSULTATION:  Newly diagnosed likely Hodgkins Lymphoma  HISTORY OF PRESENTING ILLNESS:   Heidi [name] is a  30 y.o. female who has been referred to [place] by Dr Rush Landmark, MD for evaluation and management of newly extensive lymphadenopathy concerning for Hodgkin's lymphoma.  Patient has a history of mild asthma,  but notes that she has otherwise been in good health. Has 3 children. She presented on 03/16/2016 with increasing shortness of breath for 2 weeks prior to admission. She tried to treated with over-the-counter medications including mucinex without relief and eventually was admitted on 03/16/2016 with bilateral pneumonias.  CT scan of the chest showed extensive lymphadenopathy throughout the hila, mediastinum, right axillary region as well as the visualized upper abdominal retroperitoneal region. Patient had a right axillary lymph node or biopsy by interventional radiology on 03/16/2016 that was concerning for Hodgkin's lymphoma but inadequate for definitive diagnosis. Patient was subsequently intubated on 03/17/2016 for ARDS and had a bronchoscopy which shows negative cultures thus far. Patient had an excisional biopsy of her right axillary lymph node by surgery which as per preliminary reports by the pathologist discussed with PCCM were consistent with Hodgkin's lymphoma. Final pathology report not available at this time.  Patient was noted to be HIV negative. Patient reports she's been having fevers and chills for about the last month or so with some night sweats and weight loss of about 15-20 pounds over the last couple of months.  Patient's breathing has been improving and her IV antibiotics for postobstructive pneumonia have been changed to oral Augmentin at this time. Patient is very restless to go home to be with her 3 children.  She is being mobilized today.  I discussed the available data including the concern for possible Hodgkin's lymphoma with the patient and the need for close follow-up as outpatient for additional workup and treatment. Patient notes she lives quite close to the cancer center and will certainly be following up.   MEDICAL HISTORY:  Past Medical History  Diagnosis Date  . Asthma     SURGICAL HISTORY: Past Surgical History  Procedure Laterality Date  . Axillary lymph node biopsy Right 03/19/2016    Procedure: AXILLARY LYMPH NODE BIOPSY;  Surgeon: Armandina Gemma, MD;  Location: [facility];  Service: General;  Laterality: Right;    SOCIAL HISTORY: Social History   Social History  . Marital Status: Single    Spouse Name: N/A  . Number of Children: N/A  . Years of Education: N/A   Occupational History  . Not on file.   Social History Main Topics  . Smoking status: Current Every Day Smoker -- 0.50 packs/day    Types: Cigarettes  . Smokeless tobacco: Not on file  . Alcohol Use: Yes     Comment: PT drinks 2 40 oz beer per day   . Drug Use: Not on file  . Sexual Activity: Not on file   Other Topics Concern  . Not on file   Social History Narrative    FAMILY HISTORY: History reviewed. No pertinent family history.  ALLERGIES:  has No Known Allergies.  MEDICATIONS:  Current Facility-Administered Medications  Medication Dose Route Frequency Provider Last Rate Last Dose  . acetaminophen (TYLENOL) tablet 650 mg  650 mg Oral Q6H PRN [name], MD   650 mg at 03/16/16 2233  . amoxicillin-clavulanate (  AUGMENTIN) 875-125 MG per tablet 1 tablet  1 tablet Oral Q12H Erick Colace, NP   1 tablet at 03/22/16 1129  . antiseptic oral rinse (CPC / CETYLPYRIDINIUM CHLORIDE 0.05%) solution 7 mL  7 mL Mouth Rinse BID Nilwood, MD   7 mL at 03/22/16 1000  . enoxaparin (LOVENOX) injection 40 mg  40 mg Subcutaneous Q24H Raylene Miyamoto, MD   40 mg at 03/22/16 0839  . labetalol  (NORMODYNE) tablet 100 mg  100 mg Oral BID Raylene Miyamoto, MD   100 mg at 03/22/16 1124  . levalbuterol (XOPENEX) nebulizer solution 1.25 mg  1.25 mg Nebulization Q2H PRN [name], MD      . levalbuterol [facility]) nebulizer solution 1.25 mg  1.25 mg Nebulization QID Jose Shirl Harris, MD   1.25 mg at 03/22/16 1009  . ondansetron (ZOFRAN) tablet 4 mg  4 mg Oral Q6H PRN [name], MD      . polyethylene glycol [facility] / GLYCOLAX) packet 17 g  17 g Oral Daily Raylene Miyamoto, MD   17 g at 03/20/16 1000  . sodium chloride flush (NS) 0.9 % injection 3 mL  3 mL Intravenous Q12H [name], MD   3 mL at 03/22/16 1000    REVIEW OF SYSTEMS:    10 Point review of Systems was done is negative except as noted above.  PHYSICAL EXAMINATION: ECOG PERFORMANCE STATUS: 1 - Symptomatic but completely ambulatory  . Filed Vitals:   03/22/16 1124 03/22/16 1237  BP: 142/71 125/71  Pulse: 99 83  Temp:  98 F (36.7 C)  Resp:  20   Filed Weights   03/20/16 0214 03/21/16 0500 03/22/16 0500  Weight: 134 lb 14.7 oz (61.2 kg) 128 lb 1.4 oz (58.1 kg) 121 lb 4.1 oz (55 kg)   .Body mass index is 19.58 kg/(m^2).  GENERAL:alert, in no acute distress and comfortable SKIN: skin color, texture, turgor are normal, no rashes or significant lesions EYES: normal, conjunctiva are pink and non-injected, sclera clear OROPHARYNX:no exudate, no erythema and lips, buccal mucosa, and tongue normal  NECK: supple, no JVD, thyroid normal size, non-tender LYMPH:  Palpable right upper cervical lymph node, multiple right axillary lymph nodes  LUNGS: Scattered rhonchi, no rales. HEART: regular rate & rhythm,  no murmurs and no lower extremity edema ABDOMEN: abdomen soft, non-tender, normoactive bowel sounds , no palpable hepatosplenomegaly Musculoskeletal: no cyanosis of digits and no clubbing  PSYCH: alert & oriented x 3 with fluent speech NEURO: no focal motor/sensory deficits  LABORATORY DATA:  I have  reviewed the data as listed  . CBC Latest Ref Rng 03/22/2016 03/21/2016 03/20/2016  WBC 4.0 - 10.5 K/uL 12.3(H) 12.8(H) 9.8  Hemoglobin 12.0 - 15.0 g/dL 11.2(L) 10.6(L) 10.1(L)  Hematocrit 36.0 - 46.0 % 34.3(L) 32.3(L) 32.5(L)  Platelets 150 - 400 K/uL 577(H) 557(H) 563(H)    . CMP Latest Ref Rng 03/22/2016 03/21/2016 03/20/2016  Glucose 65 - 99 mg/dL 107(H) 109(H) 98  BUN 6 - 20 mg/dL 11 15 18   Creatinine 0.44 - 1.00 mg/dL 0.45 0.45 0.58  Sodium 135 - 145 mmol/L 140 141 144  Potassium 3.5 - 5.1 mmol/L 3.4(L) 3.9 4.8  Chloride 101 - 111 mmol/L 95(L) 95(L) 103  CO2 22 - 32 mmol/L 37(H) 37(H) 32  Calcium 8.9 - 10.3 mg/dL 8.8(L) 8.6(L) 8.5(L)  Total Protein 6.5 - 8.1 g/dL - - -  Total Bilirubin 0.3 - 1.2 mg/dL - - -  Alkaline Phos 38 - 126 U/L - - -  AST 15 - 41 U/L - - -  ALT 14 - 54 U/L - - -     RADIOGRAPHIC STUDIES: I have personally reviewed the radiological images as listed and agreed with the findings in the report. Dg Chest 2 View  03/16/2016  CLINICAL DATA:  Chest pain, shortness of breath, coughing for a couple of weeks, smoker for 15 years, asthma EXAM: CHEST  2 VIEW COMPARISON:  None. FINDINGS: Borderline cardiomegaly. There is extensive consolidation left upper lobe hilar region and perihilar region. Mass or adenopathy cannot be excluded Patchy consolidation is noted in right lower lobe. Although findings may be due to bilateral pneumonia neoplastic process cannot be excluded. Further correlation with enhanced CT scan of the chest is recommended. IMPRESSION: There is extensive consolidation left upper lobe hilar region and perihilar region. Mass or adenopathy cannot be excluded. Patchy consolidation is noted in right lower lobe. Although findings may be due to bilateral pneumonia neoplastic process cannot be excluded. Further correlation with enhanced CT scan of the chest is recommended. These results were called by telephone at the time of interpretation on 03/16/2016 at 8:58 am to  Dr. Charlann Lange , who verbally acknowledged these results. Electronically Signed   By: Lahoma Crocker M.D.   On: 03/16/2016 08:59   Ct Chest W Contrast  03/16/2016  CLINICAL DATA:  Shortness of Breath EXAM: CT CHEST WITH CONTRAST TECHNIQUE: Multidetector CT imaging of the chest was performed during intravenous contrast administration. CONTRAST:  88mL OMNIPAQUE IOHEXOL 300 MG/ML  SOLN COMPARISON:  Chest radiograph March 16, 2016 FINDINGS: Mediastinum/Lymph Nodes: Thyroid appears unremarkable. There is widespread adenopathy. There are enlarged lymph nodes throughout the right axilla with the largest individual lymph node in the right axilla measuring 3.4 x 2.6 cm. There is extensive anterior mediastinal adenopathy. The largest individual lymph node in the anterior mediastinum measures 2.3 x 1.5 cm. There are multiple right peritracheal lymph nodes with the largest individual lymph node in the right peritracheal region 9 measuring 2.0 x 1.5 cm. There are lymph nodes tracking along the left-side of the aortic arch. A lymph node in the aortopulmonary window region measures 2.0 x 1.3 cm. There are lymph nodes in each hilar region. The largest lymph node in the right hilum measures 2.3 x 1.4 cm. Lymph nodes in the left hilum appear matched Ed there are difficult to distinguish from 1 another. There is a lymph node in the inferior left hilum measuring 1.6 x 1.3 cm. There is sub- carinal adenopathy with the largest sub- carinal lymph node measuring 2.2 x 1.8 cm. There is no thoracic aortic aneurysm or dissection. The visualized great vessels appear unremarkable. There is no major vessel pulmonary embolus evident. There is a rather minimal pericardial effusion. Lungs/Pleura: There is consolidation throughout essentially the entire left upper lobe. There is abrupt termination of the proximal left upper lobe bronchus, likely due to an endobronchial lesion in the left upper lobe with postobstructive pneumonitis. There is  widespread airspace consolidation throughout the right upper lobe. There is consolidation of most of the right lower lobe with narrowing of the right lower lobe bronchus. An endobronchial lesion on the right is not demonstrated, although there may well be neoplasm in the right lower lobe with surrounding pneumonitis. A lesser degree of infiltrate is noted in the right middle lobe. Patchy pneumonia is also noted in the right lower lobe, primarily in the posterior segment region. Lingula is collapsed along  with the remainder of the left upper lobe. There is no appreciable pleural effusion. Upper abdomen: In the visualized upper abdomen, there is extensive retroperitoneal adenopathy. The largest individual lymph node visualized is located between the aorta and left kidney measuring 2.1 x 1.3 cm. A second lymph node between the aorta and inferior vena cava measures 2.0 x 1.2 cm. Adrenals appear unremarkable. Visualized upper abdominal structures otherwise appear unremarkable. Musculoskeletal: No blastic or lytic bone lesions are evident. IMPRESSION: Extensive adenopathy throughout the hila, mediastinum, and right axillary region as well as in the visualized upper abdominal retroperitoneal regions. 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 Widespread pneumonitis bilaterally. There is obstruction of the left upper lobe bronchus, likely due to tumor with postobstructive pneumonitis. There is collapse of the right lower lobe without well-defined endobronchial lesion. More patchy infiltrate is noted elsewhere as noted above. Given the findings in the lungs, bronchoscopy could be most helpful to assess degree of potential tumor versus pneumonitis. No adrenal lesions evident. Electronically Signed   By: Lowella Grip III M.D.   On: 03/16/2016 11:19   US Biopsy  03/16/2016  INDICATION: 30 year old female with new diagnosis of extensive adenopathy concerning for lymphoma. EXAM: ULTRASOUND BIOPSY CORE MEDICATIONS: None.  ANESTHESIA/SEDATION: None FLUOROSCOPY TIME:  None COMPLICATIONS: None immediate. Estimated blood loss: 0 PROCEDURE: Informed written consent was obtained from the patient after a thorough discussion of the procedural risks, benefits and alternatives. All questions were addressed. A timeout was performed prior to the initiation of the procedure. The right axilla was interrogated with ultrasound. Large hypoechoic lymph nodes are identified. A suitable skin entry site was selected and marked. Local anesthesia was attained by infiltration with 1% lidocaine. A small dermatotomy was made. Under real-time sonographic guidance, multiple 16 gauge core biopsies were obtained using a Bard automated biopsy device. Biopsy specimens were placed in saline and delivered to pathology for further analysis. Post biopsy ultrasound imaging demonstrates no evidence of bleeding or complication. IMPRESSION: Technically successful ultrasound-guided core biopsy of right axillary adenopathy. Signed, Criselda Peaches, MD [facility] Electronically Signed   By: Jacqulynn Cadet M.D.   On: 03/16/2016 16:28   Dg Chest Port 1 View  03/22/2016  CLINICAL DATA:  Cough, dyspnea at rest, bilateral parenchymal consolidation, known pneumonia. EXAM: PORTABLE CHEST 1 VIEW COMPARISON:  Portable chest x-ray of March 21, 2016 FINDINGS: The left lung is well-expanded. A large left hilar mass is stable. On the right confluent alveolar opacity occupies approximately 1/3 to 1/2 of the lung volume. This appears stable and is centered in the perihilar and infrahilar regions. The interstitial markings of both lungs are mildly prominent. The cardiac silhouette is top-normal in size. The pulmonary vascularity is not clearly engorged. There is no pleural effusion or pneumothorax. The trachea is midline. The observed bony thorax exhibits no acute abnormality. IMPRESSION: Persistent bilateral parenchymal  opacities not greatly changed from the earlier study. The findings are consistent with known malignancy with lymphadenopathy and postobstructive atelectatic changes especially on the right. Electronically Signed   By: David  Martinique M.D.   On: 03/22/2016 07:08   Dg Chest Port 1 View  03/21/2016  CLINICAL DATA:  30 year old female with ARDS and asthma. EXAM: PORTABLE CHEST 1 VIEW COMPARISON:  Radiograph dated 03/19/2016 FINDINGS: Single-view of the chest demonstrates a large area of airspace opacity in the right mid lung field as well as a smaller area of opacity in the left suprahilar region. Smaller nodular ground-glass opacity noted in the left  mid lung field. Overall the lung opacities appear more confluent on this study compared to the prior study. There is no pleural effusion or pneumothorax. The cardiac silhouette is within normal limits. No acute osseous pathology identified. IMPRESSION: Bilateral airspace opacities, right greater than left. Follow-up recommended. Electronically Signed   By: Anner Crete M.D.   On: 03/21/2016 05:16   Dg Chest Port 1 View  03/19/2016  CLINICAL DATA:  Shortness of breath and cough, recent bronchoscopy with biopsy EXAM: PORTABLE CHEST 1 VIEW COMPARISON:  03/19/2016 FINDINGS: Cardiac shadow is stable. An endotracheal tube and nasogastric catheter are again seen and stable. Bilateral pulmonary infiltrates are again seen right greater than left. No pneumothorax is noted following bronchoscopy. IMPRESSION: No evidence of post bronchoscopy pneumothorax. Electronically Signed   By: Inez Catalina M.D.   On: 03/19/2016 11:20   Dg Chest Port 1 View  03/19/2016  CLINICAL DATA:  Respiratory failure. EXAM: PORTABLE CHEST 1 VIEW COMPARISON:  03/17/2016.  03/17/2016. FINDINGS: Endotracheal tube and NG tube in stable position. Heart size stable. Dense bilateral pulmonary infiltrates right side greater than left again noted. Slight interim clearing. Tiny bilateral effusions. No  pneumothorax. IMPRESSION: 1.  Lines and tubes in stable position. 2. Persistent dense bilateral pulmonary infiltrates, right side greater than left. Slight interim clearing from prior exam. Tiny bilateral pleural effusions. Electronically Signed   By: Marcello Moores  Register   On: 03/19/2016 07:08   Dg Chest Port 1 View  03/17/2016  CLINICAL DATA:  Evaluate tube placement. EXAM: PORTABLE CHEST 1 VIEW COMPARISON:  Portable film earlier today. FINDINGS: Endotracheal tube has been inserted in good position lying 4.4 cm above the carina. Orogastric tube has also been inserted and is coronal within the stomach. BILATERAL pulmonary opacities appear stable, worse on the RIGHT. IMPRESSION: Satisfactory endotracheal tube and orogastric tube placement. Electronically Signed   By: Staci Righter M.D.   On: 03/17/2016 11:44   Dg Chest Port 1 View  03/17/2016  CLINICAL DATA:  Acute respiratory failure. Lung consolidation. Extensive adenopathy. EXAM: PORTABLE CHEST 1 VIEW COMPARISON:  Chest x-ray and CT scan dated 03/16/2016 FINDINGS: Extensive bilateral lung consolidation/infiltrates persist. Increased density at the left lung apex is felt to represent loculated pleural fluid. Heart size and pulmonary vascularity are normal. Stable slight thoracic scoliosis. IMPRESSION: Persistent extensive bilateral lung consolidation/pulmonary infiltrates. This probably represents a combination of tumor infiltration and pneumonia. New small loculated effusion at the left apex. Electronically Signed   By: Lorriane Shire M.D.   On: 03/17/2016 11:00   Transthoracic Echocardiography  (Report amended )  Patient: Laurina, Guia MR #: [id number] Study Date: 03/18/2016 Gender: F Age: 50 Height: 167.6 cm Weight: 61.9 kg BSA: 1.7 m^2 Pt. Status: Room: [place], MD [place] ADMITTING Buckley, Hunt, [place] A  cc:  ------------------------------------------------------------------- LV EF: 55% - 60%  ------------------------------------------------------------------- Indications: Dyspnea 786.09.  ------------------------------------------------------------------- History: PMH: Lung Mass, Pneumonitis, Pneumonia. Risk factors: Current tobacco use.  ------------------------------------------------------------------- Study Conclusions  - Left ventricle: The cavity size was normal. Systolic function was  normal. The estimated ejection fraction was in the range of 55%  to 60%. Wall motion was normal; there were no regional wall  motion abnormalities. - Ventricular septum: In several views, the septum is flat. This  may suggest elevated right sided pressures although there is no  TR jet to confirm this. The contour showed systolic flattening. - Pericardium, extracardiac:  A small pericardial effusion was  identified along the right atrial free wall. There was no  evidence of hemodynamic compromise.  ASSESSMENT & PLAN:   30 year old African-American female with history of asthma and smoking (10 pk-yrs) with  #1 Concern for Hodgkin's lymphoma. Final pathology report pending. CT chest shows significant right axillary, mediastinal and upper retroperitoneal adenopathy. She was noted to have also additional symptoms including significant weight loss fevers chills and some night sweats. This would represent at least stage IIIB disease if the diagnosis is confirmed. HIV negative ECHO nl EF  #2 bilateral postobstructive pneumonia. Improving. Has transitioned from IV to oral antibiotics. Patient notes her breathing is much improved and she is keen to go home.  #3 cigarette smoking - 10-pack-year history of smoking. Counseled regarding absolute smoking cessation given increased risk of lung toxicity from bleomycin.  Plan -We will need  to follow-up her final pathology results to confirm the diagnosis of Hodgkin's lymphoma. -If Hodgkin's lymphoma is confirmed patient will need an outpatient staging PET/CT scan. -Pulmonary function testing after pneumonia is resolved. -Hepatitis serologies, sedimentation rate, CRP, LDH for baseline. -Will need fertility counseling prior to chemotherapy. -Will likely need port placement after her pneumonia has resolved. -Will need to follow up with Dr. Irene Limbo in 7-10 days in clinic to discuss further treatment plan -Antibiotics as per pulmonary and critical care team. -Patient counseled on absolute smoking cessation given increased risk of treatment-related pulmonary toxicity if she continues to smoke.  Follow-up with Dr. Irene Limbo in clinic in 7-10 days at the [facility].   All of the patients questions were answered to her  apparent satisfaction. The patient knows to call the clinic with any problems, questions or concerns.  I spent 80 minutes counseling the patient face to face. The total time spent in the appointment was 80 minutes and more than 50% was on counseling and direct patient cares.    Sullivan Lone MD [place] AAHIVMS [facility] Hematology/Oncology Physician [facility]  (Office):       [phone] (Work cell):  [phone] (Fax):           [phone]  03/22/2016 3:09 PM

## 2016-03-23 LAB — SEDIMENTATION RATE: Sed Rate: 16 mm/hr (ref 0–22)

## 2016-03-23 LAB — LACTATE DEHYDROGENASE: LDH: 378 U/L — AB (ref 98–192)

## 2016-03-23 LAB — C-REACTIVE PROTEIN: CRP: 2.5 mg/dL — ABNORMAL HIGH (ref <1.0)

## 2016-03-23 MED ORDER — AMOXICILLIN-POT CLAVULANATE 875-125 MG PO TABS: 1.0000 | ORAL_TABLET | Freq: Two times a day (BID) | ORAL | Status: DC

## 2016-03-23 MED ORDER — LEVALBUTEROL TARTRATE 45 MCG/ACT IN AERO: 2.0000 | INHALATION_SPRAY | Freq: Four times a day (QID) | RESPIRATORY_TRACT | Status: DC | PRN

## 2016-03-23 MED ORDER — LABETALOL HCL 100 MG PO TABS: 100.0000 mg | ORAL_TABLET | Freq: Two times a day (BID) | ORAL | Status: DC

## 2016-03-23 NOTE — Progress Notes (Signed)
Nutrition Follow-up  DOCUMENTATION CODES:   Not applicable  INTERVENTION:   - Will continue to monitor for nutrition needs.   NUTRITION DIAGNOSIS:   No new nutrition diagnosis at this time   GOAL:   Patient will meet greater than or equal to 90% of their needs  Currently met  MONITOR:   PO intake, Labs, Weight trends, I & O's  ASSESSMENT:   30 y.o. year old female without significant past medical history, she is a smoker for past 15 years. She presented to [facility] ED with ongoing cough and shortness of breath for past 2 weeks prior to this admission. She tried mucinex but this has not provided significant symptomatic relief.  Patient extubated on 3/25.  S/p procedure on 3/26: axillary lymph node biopsy; Concern for Hodgkin's lymphoma - pathology report pending  Patient reports that her appetite is normal and she has been consuming 3 meals and several snacks each day.  Reports that she consumes between 75-100% of all meals.  Patient denies any issues with swallowing.  Patient reports that her weight has been stable.   Patient likely meeting needs with intake of meals and snacks.  Patient declines offers of supplement or snacks, stating she is waiting to be discharged.  Medications reviewed.  Labs reviewed: CBGs (101-141), potassium low (3.4).   Diet Order:  Diet regular Room service appropriate?: Yes; Fluid consistency:: Thin Diet - low sodium heart healthy  Skin:  Reviewed, no issues  Last BM:  3/27  Height:   Ht Readings from Last 1 Encounters:  03/17/16 5' 6"  (1.676 m)    Weight:   Wt Readings from Last 1 Encounters:  03/23/16 128 lb 8 oz (58.287 kg)    Ideal Body Weight:  59.09 kg  BMI:  Body mass index is 20.75 kg/(m^2).  Estimated Nutritional Needs:   Kcal:  1700-1900  Protein:  60-75 grams  Fluid:  1.7 - 1.9 L  EDUCATION NEEDS:   No education needs identified at this time  Veronda Prude, Dietetic Intern Pager: [phone]

## 2016-03-23 NOTE — Progress Notes (Signed)
Discharge instructions given to patient.  Patient stated she had no questions.  Taken to front of hospital via wheelchair by NT and picked up by family.

## 2016-03-24 LAB — HEPATITIS C ANTIBODY (REFLEX): HCV Ab: <0.1 s/co ratio (ref 0.0–0.9)

## 2016-03-24 LAB — HIV ANTIBODY (ROUTINE TESTING W REFLEX): HIV Screen 4th Generation wRfx: NONREACTIVE

## 2016-03-24 LAB — HEPATITIS B SURFACE ANTIGEN: Hepatitis B Surface Ag: NEGATIVE

## 2016-03-24 LAB — HEPATITIS B CORE ANTIBODY, TOTAL: Hep B Core Total Ab: NEGATIVE

## 2016-03-24 LAB — HCV COMMENT:

## 2016-04-07 ENCOUNTER — Telehealth: Payer: Self-pay | Admitting: Hematology

## 2016-04-07 NOTE — Telephone Encounter (Signed)
Pt is aware of hosp. F/u on 04/14/16 @10 :00

## 2016-04-10 ENCOUNTER — Inpatient Hospital Stay (HOSPITAL_COMMUNITY)
Admission: EM | Admit: 2016-04-10 | Discharge: 2016-04-26 | DRG: 166 | Disposition: A | Discharge status: Home / Self Care | Payer: Self-pay | Attending: Internal Medicine | Admitting: Internal Medicine

## 2016-04-10 ENCOUNTER — Emergency Department (HOSPITAL_COMMUNITY): Payer: Self-pay

## 2016-04-10 ENCOUNTER — Encounter (HOSPITAL_COMMUNITY): Payer: Self-pay | Admitting: *Deleted

## 2016-04-10 DIAGNOSIS — G9341 Metabolic encephalopathy: Secondary | ICD-10-CM | POA: Diagnosis present

## 2016-04-10 DIAGNOSIS — R140 Abdominal distension (gaseous): Secondary | ICD-10-CM | POA: Diagnosis present

## 2016-04-10 DIAGNOSIS — R0602 Shortness of breath: Secondary | ICD-10-CM

## 2016-04-10 DIAGNOSIS — R918 Other nonspecific abnormal finding of lung field: Secondary | ICD-10-CM

## 2016-04-10 DIAGNOSIS — E162 Hypoglycemia, unspecified: Secondary | ICD-10-CM | POA: Diagnosis present

## 2016-04-10 DIAGNOSIS — R000 Tachycardia, unspecified: Secondary | ICD-10-CM | POA: Diagnosis present

## 2016-04-10 DIAGNOSIS — E871 Hypo-osmolality and hyponatremia: Secondary | ICD-10-CM | POA: Diagnosis present

## 2016-04-10 DIAGNOSIS — I10 Essential (primary) hypertension: Secondary | ICD-10-CM | POA: Diagnosis present

## 2016-04-10 DIAGNOSIS — C8590 Non-Hodgkin lymphoma, unspecified, unspecified site: Secondary | ICD-10-CM | POA: Diagnosis present

## 2016-04-10 DIAGNOSIS — J9690 Respiratory failure, unspecified, unspecified whether with hypoxia or hypercapnia: Secondary | ICD-10-CM

## 2016-04-10 DIAGNOSIS — Z452 Encounter for adjustment and management of vascular access device: Secondary | ICD-10-CM

## 2016-04-10 DIAGNOSIS — C8178 Other classical Hodgkin lymphoma, lymph nodes of multiple sites: Secondary | ICD-10-CM

## 2016-04-10 DIAGNOSIS — D473 Essential (hemorrhagic) thrombocythemia: Secondary | ICD-10-CM

## 2016-04-10 DIAGNOSIS — Z8701 Personal history of pneumonia (recurrent): Secondary | ICD-10-CM

## 2016-04-10 DIAGNOSIS — Z5111 Encounter for antineoplastic chemotherapy: Secondary | ICD-10-CM | POA: Insufficient documentation

## 2016-04-10 DIAGNOSIS — T502X5A Adverse effect of carbonic-anhydrase inhibitors, benzothiadiazides and other diuretics, initial encounter: Secondary | ICD-10-CM | POA: Diagnosis present

## 2016-04-10 DIAGNOSIS — C8190 Hodgkin lymphoma, unspecified, unspecified site: Secondary | ICD-10-CM | POA: Diagnosis present

## 2016-04-10 DIAGNOSIS — F17210 Nicotine dependence, cigarettes, uncomplicated: Secondary | ICD-10-CM | POA: Diagnosis present

## 2016-04-10 DIAGNOSIS — J189 Pneumonia, unspecified organism: Principal | ICD-10-CM | POA: Diagnosis present

## 2016-04-10 DIAGNOSIS — Y95 Nosocomial condition: Secondary | ICD-10-CM | POA: Diagnosis present

## 2016-04-10 DIAGNOSIS — E222 Syndrome of inappropriate secretion of antidiuretic hormone: Secondary | ICD-10-CM | POA: Diagnosis present

## 2016-04-10 DIAGNOSIS — E875 Hyperkalemia: Secondary | ICD-10-CM | POA: Diagnosis present

## 2016-04-10 DIAGNOSIS — D7589 Other specified diseases of blood and blood-forming organs: Secondary | ICD-10-CM | POA: Diagnosis present

## 2016-04-10 DIAGNOSIS — C8192 Hodgkin lymphoma, unspecified, intrathoracic lymph nodes: Secondary | ICD-10-CM

## 2016-04-10 DIAGNOSIS — D701 Agranulocytosis secondary to cancer chemotherapy: Secondary | ICD-10-CM | POA: Diagnosis present

## 2016-04-10 DIAGNOSIS — D72829 Elevated white blood cell count, unspecified: Secondary | ICD-10-CM | POA: Diagnosis present

## 2016-04-10 DIAGNOSIS — R590 Localized enlarged lymph nodes: Secondary | ICD-10-CM | POA: Diagnosis present

## 2016-04-10 DIAGNOSIS — T451X5A Adverse effect of antineoplastic and immunosuppressive drugs, initial encounter: Secondary | ICD-10-CM | POA: Diagnosis present

## 2016-04-10 DIAGNOSIS — Y848 Other medical procedures as the cause of abnormal reaction of the patient, or of later complication, without mention of misadventure at the time of the procedure: Secondary | ICD-10-CM | POA: Diagnosis not present

## 2016-04-10 DIAGNOSIS — J45909 Unspecified asthma, uncomplicated: Secondary | ICD-10-CM | POA: Diagnosis present

## 2016-04-10 DIAGNOSIS — I959 Hypotension, unspecified: Secondary | ICD-10-CM | POA: Diagnosis not present

## 2016-04-10 DIAGNOSIS — J9602 Acute respiratory failure with hypercapnia: Secondary | ICD-10-CM | POA: Diagnosis present

## 2016-04-10 DIAGNOSIS — J9600 Acute respiratory failure, unspecified whether with hypoxia or hypercapnia: Secondary | ICD-10-CM | POA: Diagnosis present

## 2016-04-10 DIAGNOSIS — E876 Hypokalemia: Secondary | ICD-10-CM | POA: Diagnosis not present

## 2016-04-10 DIAGNOSIS — Z79899 Other long term (current) drug therapy: Secondary | ICD-10-CM

## 2016-04-10 DIAGNOSIS — R739 Hyperglycemia, unspecified: Secondary | ICD-10-CM | POA: Diagnosis not present

## 2016-04-10 DIAGNOSIS — E872 Acidosis: Secondary | ICD-10-CM | POA: Diagnosis present

## 2016-04-10 DIAGNOSIS — Z9889 Other specified postprocedural states: Secondary | ICD-10-CM | POA: Insufficient documentation

## 2016-04-10 DIAGNOSIS — C8128 Mixed cellularity classical Hodgkin lymphoma, lymph nodes of multiple sites: Secondary | ICD-10-CM | POA: Diagnosis present

## 2016-04-10 DIAGNOSIS — E8770 Fluid overload, unspecified: Secondary | ICD-10-CM | POA: Clinically undetermined

## 2016-04-10 DIAGNOSIS — J9601 Acute respiratory failure with hypoxia: Secondary | ICD-10-CM | POA: Diagnosis present

## 2016-04-10 DIAGNOSIS — J90 Pleural effusion, not elsewhere classified: Secondary | ICD-10-CM | POA: Diagnosis present

## 2016-04-10 DIAGNOSIS — D75839 Thrombocytosis, unspecified: Secondary | ICD-10-CM | POA: Diagnosis present

## 2016-04-10 DIAGNOSIS — E46 Unspecified protein-calorie malnutrition: Secondary | ICD-10-CM | POA: Diagnosis present

## 2016-04-10 DIAGNOSIS — Q211 Atrial septal defect: Secondary | ICD-10-CM

## 2016-04-10 HISTORY — DX: Essential (primary) hypertension: I10

## 2016-04-10 HISTORY — DX: Hodgkin lymphoma, unspecified, unspecified site: C81.90

## 2016-04-10 LAB — CBC WITH DIFFERENTIAL/PLATELET
Basophils Absolute: 0 10*3/uL (ref 0.0–0.1)
Basophils Relative: 0 %
EOS PCT: 1 %
Eosinophils Absolute: 0.1 10*3/uL (ref 0.0–0.7)
HEMATOCRIT: 35 % — AB (ref 36.0–46.0)
Hemoglobin: 11.9 g/dL — ABNORMAL LOW (ref 12.0–15.0)
LYMPHS PCT: 5 %
Lymphs Abs: 0.5 10*3/uL — ABNORMAL LOW (ref 0.7–4.0)
MCH: 29.6 pg (ref 26.0–34.0)
MCHC: 34 g/dL (ref 30.0–36.0)
MCV: 87.1 fL (ref 78.0–100.0)
MONO ABS: 0.9 10*3/uL (ref 0.1–1.0)
MONOS PCT: 9 %
NEUTROS ABS: 8 10*3/uL — AB (ref 1.7–7.7)
Neutrophils Relative %: 85 %
PLATELETS: 565 10*3/uL — AB (ref 150–400)
RBC: 4.02 MIL/uL (ref 3.87–5.11)
RDW: 14.1 % (ref 11.5–15.5)
WBC: 9.5 10*3/uL (ref 4.0–10.5)

## 2016-04-10 LAB — BLOOD GAS, ARTERIAL
Acid-Base Excess: 3.7 mmol/L — ABNORMAL HIGH (ref 0.0–2.0)
Bicarbonate: 28.4 mEq/L — ABNORMAL HIGH (ref 20.0–24.0)
Drawn by: 11249
O2 CONTENT: 8 L/min
O2 Saturation: 95.2 %
PATIENT TEMPERATURE: 99.6
PCO2 ART: 46.7 mmHg — AB (ref 35.0–45.0)
PH ART: 7.404 (ref 7.350–7.450)
PO2 ART: 87.3 mmHg (ref 80.0–100.0)
TCO2: 26.1 mmol/L (ref 0–100)

## 2016-04-10 LAB — COMPREHENSIVE METABOLIC PANEL
ALT: 15 U/L (ref 14–54)
ANION GAP: 14 (ref 5–15)
AST: 16 U/L (ref 15–41)
Albumin: 2.2 g/dL — ABNORMAL LOW (ref 3.5–5.0)
Alkaline Phosphatase: 107 U/L (ref 38–126)
BILIRUBIN TOTAL: 0.1 mg/dL — AB (ref 0.3–1.2)
BUN: 8 mg/dL (ref 6–20)
CHLORIDE: 87 mmol/L — AB (ref 101–111)
CO2: 27 mmol/L (ref 22–32)
Calcium: 8.2 mg/dL — ABNORMAL LOW (ref 8.9–10.3)
Creatinine, Ser: 0.58 mg/dL (ref 0.44–1.00)
Glucose, Bld: 84 mg/dL (ref 65–99)
POTASSIUM: 4.4 mmol/L (ref 3.5–5.1)
Sodium: 128 mmol/L — ABNORMAL LOW (ref 135–145)
TOTAL PROTEIN: 5.1 g/dL — AB (ref 6.5–8.1)

## 2016-04-10 LAB — I-STAT CG4 LACTIC ACID, ED: LACTIC ACID, VENOUS: 1.5 mmol/L (ref 0.5–2.0)

## 2016-04-10 LAB — I-STAT TROPONIN, ED: TROPONIN I, POC: 0 ng/mL (ref 0.00–0.08)

## 2016-04-10 MED ORDER — SODIUM CHLORIDE 0.9 % IV SOLN
INTRAVENOUS | Status: AC
  Administered 2016-04-11: via INTRAVENOUS

## 2016-04-10 MED ORDER — ENOXAPARIN SODIUM 40 MG/0.4ML ~~LOC~~ SOLN
40.0000 mg | Freq: Every day | SUBCUTANEOUS | Status: DC
  Administered 2016-04-11 – 2016-04-21 (×12): 40 mg via SUBCUTANEOUS
  Filled 2016-04-10 (×16): qty 0.4

## 2016-04-10 MED ORDER — LEVALBUTEROL HCL 0.63 MG/3ML IN NEBU
0.6300 mg | INHALATION_SOLUTION | Freq: Four times a day (QID) | RESPIRATORY_TRACT | Status: DC | PRN
  Administered 2016-04-13 – 2016-04-22 (×4): 0.63 mg via RESPIRATORY_TRACT
  Filled 2016-04-10 (×5): qty 3

## 2016-04-10 MED ORDER — OXYCODONE HCL 5 MG PO TABS
5.0000 mg | ORAL_TABLET | ORAL | Status: DC | PRN
  Administered 2016-04-13: 5 mg via ORAL
  Filled 2016-04-10: qty 1

## 2016-04-10 MED ORDER — VANCOMYCIN HCL IN DEXTROSE 1-5 GM/200ML-% IV SOLN
1000.0000 mg | Freq: Once | INTRAVENOUS | Status: AC
  Administered 2016-04-10: 1000 mg via INTRAVENOUS
  Filled 2016-04-10: qty 200

## 2016-04-10 MED ORDER — HYDROMORPHONE HCL 1 MG/ML IJ SOLN: 0.5000 mg | INTRAMUSCULAR | Status: DC | PRN

## 2016-04-10 MED ORDER — LABETALOL HCL 100 MG PO TABS
100.0000 mg | ORAL_TABLET | Freq: Two times a day (BID) | ORAL | Status: DC
  Administered 2016-04-11 – 2016-04-12 (×4): 100 mg via ORAL
  Filled 2016-04-10 (×4): qty 1

## 2016-04-10 MED ORDER — GUAIFENESIN ER 600 MG PO TB12
1200.0000 mg | ORAL_TABLET | Freq: Two times a day (BID) | ORAL | Status: DC | PRN
  Administered 2016-04-11: 1200 mg via ORAL
  Filled 2016-04-10: qty 2

## 2016-04-10 MED ORDER — ONDANSETRON HCL 4 MG PO TABS: 4.0000 mg | ORAL_TABLET | Freq: Four times a day (QID) | ORAL | Status: DC | PRN

## 2016-04-10 MED ORDER — ALBUTEROL SULFATE (2.5 MG/3ML) 0.083% IN NEBU: 5.0000 mg | INHALATION_SOLUTION | RESPIRATORY_TRACT | Status: AC | PRN

## 2016-04-10 MED ORDER — ALBUTEROL SULFATE (2.5 MG/3ML) 0.083% IN NEBU
5.0000 mg | INHALATION_SOLUTION | Freq: Once | RESPIRATORY_TRACT | Status: AC
  Administered 2016-04-10: 5 mg via RESPIRATORY_TRACT
  Filled 2016-04-10: qty 6

## 2016-04-10 MED ORDER — IPRATROPIUM BROMIDE 0.02 % IN SOLN
0.5000 mg | Freq: Once | RESPIRATORY_TRACT | Status: AC
  Administered 2016-04-10: 0.5 mg via RESPIRATORY_TRACT
  Filled 2016-04-10: qty 2.5

## 2016-04-10 MED ORDER — DEXTROSE 5 % IV SOLN
2.0000 g | Freq: Once | INTRAVENOUS | Status: AC
  Administered 2016-04-10: 2 g via INTRAVENOUS
  Filled 2016-04-10: qty 2

## 2016-04-10 MED ORDER — ACETAMINOPHEN 325 MG PO TABS
650.0000 mg | ORAL_TABLET | Freq: Four times a day (QID) | ORAL | Status: DC | PRN
  Administered 2016-04-11 – 2016-04-13 (×2): 650 mg via ORAL
  Filled 2016-04-10 (×3): qty 2

## 2016-04-10 MED ORDER — ACETAMINOPHEN 650 MG RE SUPP: 650.0000 mg | Freq: Four times a day (QID) | RECTAL | Status: DC | PRN

## 2016-04-10 MED ORDER — DEXTROSE 5 % IV SOLN
1.0000 g | Freq: Three times a day (TID) | INTRAVENOUS | Status: DC
  Administered 2016-04-11 – 2016-04-12 (×4): 1 g via INTRAVENOUS
  Filled 2016-04-10 (×5): qty 1

## 2016-04-10 MED ORDER — SODIUM CHLORIDE 0.9 % IV BOLUS (SEPSIS)
1000.0000 mL | Freq: Once | INTRAVENOUS | Status: AC
  Administered 2016-04-10: 1000 mL via INTRAVENOUS

## 2016-04-10 MED ORDER — ONDANSETRON HCL 4 MG/2ML IJ SOLN: 4.0000 mg | Freq: Four times a day (QID) | INTRAMUSCULAR | Status: DC | PRN

## 2016-04-10 MED ORDER — DIPHENHYDRAMINE HCL 25 MG PO CAPS
25.0000 mg | ORAL_CAPSULE | Freq: Three times a day (TID) | ORAL | Status: DC | PRN
  Administered 2016-04-11 – 2016-04-24 (×4): 25 mg via ORAL
  Filled 2016-04-10 (×4): qty 1

## 2016-04-10 MED ORDER — IOPAMIDOL (ISOVUE-370) INJECTION 76%
100.0000 mL | Freq: Once | INTRAVENOUS | Status: AC | PRN
  Administered 2016-04-10: 100 mL via INTRAVENOUS

## 2016-04-10 MED ORDER — SODIUM CHLORIDE 0.9 % IV SOLN: INTRAVENOUS | Status: DC

## 2016-04-10 NOTE — ED Notes (Signed)
Patient transported to X-ray 

## 2016-04-10 NOTE — ED Notes (Addendum)
PT states she has had SOB for about 1 week, increasingly worse. Recent dx with lung mass. Also reports swelling in both of her legs. Pt states she has been using her inhaler without relief. Pt has f/u onc. Appointment [phone]

## 2016-04-10 NOTE — ED Provider Notes (Signed)
CSN: [id number]     Arrival date & time 04/10/16  1836 History   First MD Initiated Contact with Patient 04/10/16 1903     Chief Complaint  Patient presents with  . Shortness of Breath     (Consider location/radiation/quality/duration/timing/severity/associated sxs/prior Treatment) HPI Heidi [name] is a 30 y.o. female with history of asthma and recent admission to the hospital during which she was diagnosed with pneumonia and has differential diagnosis of lymphoma versus lung mass, presents to emergency department with worsening of shortness of breath. Patient states that she was discharged approximately 2 weeks ago. She states that she finished a course of outpatient antibiotics, and felt better. States 2 days after which was about a week ago she started having shortness of breath again. She states he has been worsening daily. She reports that she continues to cough with productive sputum. She has been using her inhaler every hour with no relief. She reports some chest pain. She reports swelling in bilateral ankles. Denies any calf pain. Denies any pleuritic pain. Denies any fever or chills. She has not had her follow-up appointment since her discharge. During hospitalization, patient was found to have a post obstructive pneumonia, possible mass, she underwent multiple studies. At time of discharge differential included lymphoma versus lung mass. She went home on 10 days of Augmentin.  Past Medical History  Diagnosis Date  . Asthma   . Cancer [facility])    Past Surgical History  Procedure Laterality Date  . Axillary lymph node biopsy Right 03/19/2016    Procedure: AXILLARY LYMPH NODE BIOPSY;  Surgeon: Armandina Gemma, MD;  Location: [facility]S;  Service: General;  Laterality: Right;   No family history on file. Social History  Substance Use Topics  . Smoking status: Current Every Day Smoker -- 0.50 packs/day    Types: Cigarettes  . Smokeless tobacco: None  . Alcohol Use: Yes     Comment: PT  drinks 2 40 oz beer per day    OB History    No data available     Review of Systems  Constitutional: Negative for fever and chills.  Respiratory: Positive for cough, chest tightness and shortness of breath.   Cardiovascular: Negative for chest pain, palpitations and leg swelling.  Gastrointestinal: Negative for nausea, vomiting, abdominal pain and diarrhea.  Genitourinary: Negative for dysuria, flank pain, vaginal bleeding, vaginal discharge, vaginal pain and pelvic pain.  Musculoskeletal: Negative for myalgias, arthralgias, neck pain and neck stiffness.  Skin: Negative for rash.  Neurological: Negative for dizziness, weakness and headaches.  All other systems reviewed and are negative.     Allergies  Review of patient's allergies indicates no known allergies.  Home Medications   Prior to Admission medications   Medication Sig Start Date End Date Taking? Authorizing Provider  amoxicillin-clavulanate (AUGMENTIN) 875-125 MG tablet Take 1 tablet by mouth every 12 (twelve) hours. 03/23/16   Erick Colace, NP  guaiFENesin (MUCINEX) 600 MG 12 hr tablet Take 1,200 mg by mouth 2 (two) times daily as needed for cough or to loosen phlegm.    Historical Provider, MD  labetalol (NORMODYNE) 100 MG tablet Take 1 tablet (100 mg total) by mouth 2 (two) times daily. 03/23/16   Erick Colace, NP  levalbuterol [facility] HFA) 45 MCG/ACT inhaler Inhale 2 puffs into the lungs every 6 (six) hours as needed for wheezing. 03/23/16   Erick Colace, NP   BP 134/83 mmHg  Pulse 110  Temp(Src) 99.6 F (37.6 C) (Oral)  Resp  27  SpO2 95%  LMP 01/11/2016 (Approximate) Physical Exam  Constitutional: She is oriented to person, place, and time. She appears well-developed and well-nourished. No distress.  HENT:  Head: Normocephalic.  Eyes: Conjunctivae are normal.  Neck: Neck supple.  Cardiovascular: Normal rate, regular rhythm and normal heart sounds.   Pulmonary/Chest: Effort normal. No respiratory  distress. She has wheezes. She has rales. She exhibits no tenderness.  diminished sounds at right base  Abdominal: Soft. Bowel sounds are normal. She exhibits no distension. There is no tenderness. There is no rebound.  Musculoskeletal:  Trace edema in bilateral ankles, non pitting  Neurological: She is alert and oriented to person, place, and time.  Skin: Skin is warm and dry.  Psychiatric: She has a normal mood and affect. Her behavior is normal.  Nursing note and vitals reviewed.   ED Course  Procedures (including critical care time) Labs Review Labs Reviewed  CBC WITH DIFFERENTIAL/PLATELET  COMPREHENSIVE METABOLIC PANEL  BLOOD GAS, ARTERIAL  I-STAT TROPOININ, ED  I-STAT CG4 LACTIC ACID, ED    Imaging Review Dg Chest 2 View  04/10/2016  CLINICAL DATA:  Patient with shortness of breath for multiple weeks. Recently diagnosed with lymphoma. EXAM: CHEST  2 VIEW COMPARISON:  Chest CT 03/16/2016; chest radiograph 03/22/2016. FINDINGS: Multiple monitoring leads overlie the patient. Stable cardiac and mediastinal contours with extensive bilateral hilar and mediastinal adenopathy. Significant worsening of consolidation within the right lung. Persistent consolidation within the left mid lung with associated small nodular opacities. Possible right pleural effusion. No pneumothorax. Regional skeleton is unremarkable. IMPRESSION: Interval worsening of consolidation throughout the majority of the right lung which may represent malignancy, postobstructive pneumonia and/or effusion. Persistent hilar and mediastinal adenopathy. Unchanged scattered opacities and consolidation within the left lung. Electronically Signed   By: Lovey Newcomer M.D.   On: 04/10/2016 19:32   Ct Angio Chest Pe W/cm &/or Wo Cm  04/10/2016  CLINICAL DATA:  Acute onset of worsening shortness of breath. Bilateral leg swelling. Recently diagnosed with Hodgkin's lymphoma. Initial encounter. EXAM: CT ANGIOGRAPHY CHEST WITH CONTRAST  TECHNIQUE: Multidetector CT imaging of the chest was performed using the standard protocol during bolus administration of intravenous contrast. Multiplanar CT image reconstructions and MIPs were obtained to evaluate the vascular anatomy. CONTRAST:  100 mL of Isovue 370 IV contrast COMPARISON:  CT of the chest performed 03/16/2016, and chest radiograph performed 04/10/2016 FINDINGS: There is no evidence of pulmonary embolus. There is near complete dense consolidation of the right lung, and central consolidation involving the left upper lobe, with additional scattered nodular opacities throughout the left lung. This is significantly worsened from the prior CT, reflecting worsening bilateral pneumonia. On correlation with right axillary lymph node biopsy results, the patient was recently diagnosed with classic Hodgkin's lymphoma. Hodgkin's lymphoma typically would not narrow the airway to the extent required for postobstructive pneumonia, and is likely unrelated to the patient's presentation with diffuse bilateral pneumonia. There is no evidence of pleural effusion or pneumothorax. Extensive enlarged mediastinal nodes are seen, particularly anteriorly, measuring up to 1.8 cm in short axis. Right axillary nodes measure up to 2.4 cm in short axis. These are compatible with the patient's Hodgkin's lymphoma. A small 3.8 cm collection of fluid at the right axilla likely reflects a postoperative seroma, status post recent right hilar lymph node biopsy. No pericardial effusion is identified. The great vessels are grossly unremarkable in appearance. The visualized portions of the thyroid gland are unremarkable in appearance. The visualized portions of the liver  and spleen are unremarkable. No acute osseous abnormalities are seen. Review of the MIP images confirms the above findings. IMPRESSION: 1. No evidence of pulmonary embolus. 2. Near complete dense consolidation of the right lung, and central consolidation involving the  left upper lobe, with scattered nodular opacities throughout the left lung. This is significantly worsened from the prior CT, reflecting worsening bilateral pneumonia. 3. The patient was recently diagnosed with classic Hodgkin's lymphoma for right axillary lymph node biopsy results. The Hodgkin's lymphoma is likely unrelated to the patient's diffuse bilateral pneumonia, as lymph nodes typically do not obstruct the tracheobronchial tree. Enlarged mediastinal and right axillary nodes reflect the patient's lymphoma. 4. 3.8 cm collection of fluid at the right axilla likely reflects a postoperative seroma, status post recent right hilar lymph node biopsy. These results were called by telephone at the time of interpretation on 04/10/2016 at 9:19 pm to [facility] PA, who verbally acknowledged these results. Electronically Signed   By: Garald Balding M.D.   On: 04/10/2016 21:26   I have personally reviewed and evaluated these images and lab results as part of my medical decision-making.   EKG Interpretation None      MDM   Final diagnoses:  HCAP (healthcare-associated pneumonia)    Patient seen and examined. Patient's tachypnea. She is complaining of shortness of breath. Oxygen saturation is in the 90s on 5 L. Patient apparently had oxygen saturation in the 60s upon arrival. Patient speaking in full sentences. She does not appear to be in distress other than tachypnea. She has inspiratory and expiratory wheezes in lungs bilaterally. We'll start breathing treatment, will get labs, blood gas, repeat chest x-ray for now.  CT angio showing worsening pneumonia. Per radiologist, no masses, right and now left lung pneumonia. Will start antibiotics. Pt feels better with breathing treatments. Tachypnea improving.   10:23 PM Spoke with [facility], will admit pt.     Jeannett Senior, PA-C 04/11/16 0155  Jola Schmidt, MD 04/11/16 [id number]

## 2016-04-10 NOTE — ED Notes (Signed)
Admitting MD at bedside.

## 2016-04-10 NOTE — ED Notes (Signed)
Called 2W - spoke with charge nurse. 20 min timer started room change 1234 -

## 2016-04-10 NOTE — ED Notes (Signed)
PA at bedside.

## 2016-04-10 NOTE — H&P (Signed)
[facility] Admission History and Physical       Heidi [name] [id number] DOB: 29-Sep-1986 DOA: 04/10/2016  Referring physician: EDP PCP: No primary care provider on file.  Specialists:   Chief Complaint: SOB  HPI: Heidi [name] is a 30 y.o. female with a recent diagnosis of Lymphoma and hospitalization for Pneumonia and ARDS on 03/21-03/28 who presents to the ED with complaints of worsening SOB along with cough and Fevers and chills for the past 2 weeks.    She was evaluated in the ED and found to have hypoxia and was placed on 3 liters NCO2 bilateral pneumonia on  CTA of the Chest and was referred for admission.    She was placed on IV Vancomycin and Cefepime.     Review of Systems:   Constitutional: No Weight Loss, No Weight Gain, Night Sweats, Fevers, Chills, Dizziness, Light Headedness, Fatigue, or Generalized Weakness HEENT: No Headaches, Difficulty Swallowing,Tooth/Dental Problems,Sore Throat,  No Sneezing, Rhinitis, Ear Ache, Nasal Congestion, or Post Nasal Drip,  Cardio-vascular:  No Chest pain, Orthopnea, PND, Edema in Lower Extremities, Anasarca, Dizziness, Palpitations  Resp:    +Dyspnea, No DOE, No Productive Cough, No Non-Productive Cough, No Hemoptysis, No Wheezing.    GI: No Heartburn, Indigestion, Abdominal Pain, Nausea, Vomiting, Diarrhea, Constipation, Hematemesis, Hematochezia, Melena, Change in Bowel Habits,  Loss of Appetite  GU: No Dysuria, No Change in Color of Urine, No Urgency or Urinary Frequency, No Flank pain.  Musculoskeletal: No Joint Pain or Swelling, No Decreased Range of Motion, No Back Pain.  Neurologic: No Syncope, No Seizures, Muscle Weakness, Paresthesia, Vision Disturbance or Loss, No Diplopia, No Vertigo, No Difficulty Walking,  Skin: No Rash or Lesions. Psych: No Change in Mood or Affect, No Depression or Anxiety, No Memory loss, No Confusion, or Hallucinations   Past Medical History  Diagnosis Date  . Asthma   . Cancer  [facility])      Past Surgical History  Procedure Laterality Date  . Axillary lymph node biopsy Right 03/19/2016    Procedure: AXILLARY LYMPH NODE BIOPSY;  Surgeon: Armandina Gemma, MD;  Location: [facility];  Service: General;  Laterality: Right;      Prior to Admission medications   Medication Sig Start Date End Date Taking? Authorizing Provider  guaiFENesin (MUCINEX) 600 MG 12 hr tablet Take 1,200 mg by mouth 2 (two) times daily as needed for cough or to loosen phlegm.   Yes Historical Provider, MD  labetalol (NORMODYNE) 100 MG tablet Take 1 tablet (100 mg total) by mouth 2 (two) times daily. 03/23/16  Yes Erick Colace, NP  levalbuterol [facility] HFA) 45 MCG/ACT inhaler Inhale 2 puffs into the lungs every 6 (six) hours as needed for wheezing. 03/23/16  Yes Erick Colace, NP  amoxicillin-clavulanate (AUGMENTIN) 875-125 MG tablet Take 1 tablet by mouth every 12 (twelve) hours. Patient not taking: Reported on 04/10/2016 03/23/16   Erick Colace, NP     No Known Allergies    Social History:  reports that she has been smoking Cigarettes.  She has been smoking about 0.50 packs per day. She does not have any smokeless tobacco history on file. She reports that she drinks alcohol. Her drug history is not on file.     No family history on file.     Physical Exam:  GEN:  Pleasant Thin ill Appearing  30 y.o. African American female examined and in no acute distress; cooperative with exam Filed Vitals:   04/10/16 2030 04/10/16 2100 04/10/16  2200 04/10/16 2210  BP: 129/79 120/72 138/75 138/75  Pulse: 125 110 101 106  Temp:    98.7 F (37.1 C)  TempSrc:    Oral  Resp: 21 24 12 24   SpO2: 95% 92% 92% 92%   Blood pressure 138/75, pulse 106, temperature 98.7 F (37.1 C), temperature source Oral, resp. rate 24, last menstrual period 01/11/2016, SpO2 92 %. PSYCH: She is alert and oriented x4; does not appear anxious does not appear depressed; affect is normal HEENT: Normocephalic and Atraumatic,  Mucous membranes pink; PERRLA; EOM intact; Fundi:  Benign;  No scleral icterus, Nares: Patent, Oropharynx: Clear, Edentulous or Fair Dentition,    Neck:  FROM, No Cervical Lymphadenopathy nor Thyromegaly or Carotid Bruit; No JVD; Breasts:: Not examined CHEST WALL: No tenderness CHEST: Normal respiration, clear to auscultation bilaterally HEART: Regular rate and rhythm; no murmurs rubs or gallops BACK: No kyphosis or scoliosis; No CVA tenderness ABDOMEN: Positive Bowel Sounds, Scaphoid, Soft Non-Tender, No Rebound or Guarding; No Masses, No Organomegaly, No Pannus; No Intertriginous candida. Rectal Exam: Not done EXTREMITIES: No Cyanosis, Clubbing, or Edema; No Ulcerations. Genitalia: not examined PULSES: 2+ and symmetric SKIN: Normal hydration no rash or ulceration CNS:  Alert and Oriented x 4, No Focal Deficits Vascular: pulses palpable throughout    Labs on Admission:  Basic Metabolic Panel:  Recent Labs Lab 04/10/16 1930  NA 128*  K 4.4  CL 87*  CO2 27  GLUCOSE 84  BUN 8  CREATININE 0.58  CALCIUM 8.2*   Liver Function Tests:  Recent Labs Lab 04/10/16 1930  AST 16  ALT 15  ALKPHOS 107  BILITOT 0.1*  PROT 5.1*  ALBUMIN 2.2*   No results for input(s): LIPASE, AMYLASE in the last 168 hours. No results for input(s): AMMONIA in the last 168 hours. CBC:  Recent Labs Lab 04/10/16 1930  WBC 9.5  NEUTROABS 8.0*  HGB 11.9*  HCT 35.0*  MCV 87.1  PLT 565*   Cardiac Enzymes: No results for input(s): CKTOTAL, CKMB, CKMBINDEX, TROPONINI in the last 168 hours.  BNP (last 3 results) No results for input(s): BNP in the last 8760 hours.  ProBNP (last 3 results) No results for input(s): PROBNP in the last 8760 hours.  CBG: No results for input(s): GLUCAP in the last 168 hours.  Radiological Exams on Admission: Dg Chest 2 View  04/10/2016  CLINICAL DATA:  Patient with shortness of breath for multiple weeks. Recently diagnosed with lymphoma. EXAM: CHEST  2 VIEW  COMPARISON:  Chest CT 03/16/2016; chest radiograph 03/22/2016. FINDINGS: Multiple monitoring leads overlie the patient. Stable cardiac and mediastinal contours with extensive bilateral hilar and mediastinal adenopathy. Significant worsening of consolidation within the right lung. Persistent consolidation within the left mid lung with associated small nodular opacities. Possible right pleural effusion. No pneumothorax. Regional skeleton is unremarkable. IMPRESSION: Interval worsening of consolidation throughout the majority of the right lung which may represent malignancy, postobstructive pneumonia and/or effusion. Persistent hilar and mediastinal adenopathy. Unchanged scattered opacities and consolidation within the left lung. Electronically Signed   By: Lovey Newcomer M.D.   On: 04/10/2016 19:32   Ct Angio Chest Pe W/cm &/or Wo Cm  04/10/2016  CLINICAL DATA:  Acute onset of worsening shortness of breath. Bilateral leg swelling. Recently diagnosed with Hodgkin's lymphoma. Initial encounter. EXAM: CT ANGIOGRAPHY CHEST WITH CONTRAST TECHNIQUE: Multidetector CT imaging of the chest was performed using the standard protocol during bolus administration of intravenous contrast. Multiplanar CT image reconstructions and MIPs were obtained to  evaluate the vascular anatomy. CONTRAST:  100 mL of Isovue 370 IV contrast COMPARISON:  CT of the chest performed 03/16/2016, and chest radiograph performed 04/10/2016 FINDINGS: There is no evidence of pulmonary embolus. There is near complete dense consolidation of the right lung, and central consolidation involving the left upper lobe, with additional scattered nodular opacities throughout the left lung. This is significantly worsened from the prior CT, reflecting worsening bilateral pneumonia. On correlation with right axillary lymph node biopsy results, the patient was recently diagnosed with classic Hodgkin's lymphoma. Hodgkin's lymphoma typically would not narrow the airway to the  extent required for postobstructive pneumonia, and is likely unrelated to the patient's presentation with diffuse bilateral pneumonia. There is no evidence of pleural effusion or pneumothorax. Extensive enlarged mediastinal nodes are seen, particularly anteriorly, measuring up to 1.8 cm in short axis. Right axillary nodes measure up to 2.4 cm in short axis. These are compatible with the patient's Hodgkin's lymphoma. A small 3.8 cm collection of fluid at the right axilla likely reflects a postoperative seroma, status post recent right hilar lymph node biopsy. No pericardial effusion is identified. The great vessels are grossly unremarkable in appearance. The visualized portions of the thyroid gland are unremarkable in appearance. The visualized portions of the liver and spleen are unremarkable. No acute osseous abnormalities are seen. Review of the MIP images confirms the above findings. IMPRESSION: 1. No evidence of pulmonary embolus. 2. Near complete dense consolidation of the right lung, and central consolidation involving the left upper lobe, with scattered nodular opacities throughout the left lung. This is significantly worsened from the prior CT, reflecting worsening bilateral pneumonia. 3. The patient was recently diagnosed with classic Hodgkin's lymphoma for right axillary lymph node biopsy results. The Hodgkin's lymphoma is likely unrelated to the patient's diffuse bilateral pneumonia, as lymph nodes typically do not obstruct the tracheobronchial tree. Enlarged mediastinal and right axillary nodes reflect the patient's lymphoma. 4. 3.8 cm collection of fluid at the right axilla likely reflects a postoperative seroma, status post recent right hilar lymph node biopsy. These results were called by telephone at the time of interpretation on 04/10/2016 at 9:19 pm to [facility] PA, who verbally acknowledged these results. Electronically Signed   By: Garald Balding M.D.   On: 04/10/2016 21:26     EKG:  Independently reviewed.     Assessment/Plan:      30 y.o. 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

## 2016-04-10 NOTE — ED Notes (Signed)
Per pt, states SOB on and off since discharge from hospital-increased symptoms today-recently diagnosed with lung cancer-was treated/hospitalized for PNA

## 2016-04-11 ENCOUNTER — Inpatient Hospital Stay (HOSPITAL_COMMUNITY): Payer: Self-pay

## 2016-04-11 LAB — CBC
HEMATOCRIT: 35.5 % — AB (ref 36.0–46.0)
HEMOGLOBIN: 12.2 g/dL (ref 12.0–15.0)
MCH: 30.7 pg (ref 26.0–34.0)
MCHC: 34.4 g/dL (ref 30.0–36.0)
MCV: 89.2 fL (ref 78.0–100.0)
Platelets: 622 10*3/uL — ABNORMAL HIGH (ref 150–400)
RBC: 3.98 MIL/uL (ref 3.87–5.11)
RDW: 14.3 % (ref 11.5–15.5)
WBC: 9.5 10*3/uL (ref 4.0–10.5)

## 2016-04-11 LAB — BLOOD GAS, ARTERIAL
ACID-BASE EXCESS: 3.5 mmol/L — AB (ref 0.0–2.0)
Bicarbonate: 28.8 mEq/L — ABNORMAL HIGH (ref 20.0–24.0)
DRAWN BY: 295031
O2 Content: 2 L/min
O2 SAT: 64.6 %
PATIENT TEMPERATURE: 98.6
TCO2: 26.8 mmol/L (ref 0–100)
pCO2 arterial: 49.7 mmHg — ABNORMAL HIGH (ref 35.0–45.0)
pH, Arterial: 7.381 (ref 7.350–7.450)
pO2, Arterial: 38.2 mmHg — CL (ref 80.0–100.0)

## 2016-04-11 LAB — BASIC METABOLIC PANEL
Anion gap: 5 (ref 5–15)
BUN: 5 mg/dL — ABNORMAL LOW (ref 6–20)
CHLORIDE: 96 mmol/L — AB (ref 101–111)
CO2: 29 mmol/L (ref 22–32)
Calcium: 7.9 mg/dL — ABNORMAL LOW (ref 8.9–10.3)
Creatinine, Ser: 0.47 mg/dL (ref 0.44–1.00)
GFR calc Af Amer: >60 mL/min (ref >60)
GFR calc non Af Amer: >60 mL/min (ref >60)
Glucose, Bld: 123 mg/dL — ABNORMAL HIGH (ref 65–99)
POTASSIUM: 4.2 mmol/L (ref 3.5–5.1)
Sodium: 130 mmol/L — ABNORMAL LOW (ref 135–145)

## 2016-04-11 LAB — STREP PNEUMONIAE URINARY ANTIGEN: Strep Pneumo Urinary Antigen: NEGATIVE

## 2016-04-11 LAB — MRSA PCR SCREENING: MRSA by PCR: POSITIVE — AB

## 2016-04-11 MED ORDER — CHLORHEXIDINE GLUCONATE CLOTH 2 % EX PADS
6.0000 | MEDICATED_PAD | Freq: Every day | CUTANEOUS | Status: DC
  Administered 2016-04-13 – 2016-04-15 (×3): 6 via TOPICAL

## 2016-04-11 MED ORDER — SODIUM CHLORIDE 0.9 % IV SOLN
500.0000 mg | Freq: Three times a day (TID) | INTRAVENOUS | Status: DC
  Administered 2016-04-11 – 2016-04-12 (×5): 500 mg via INTRAVENOUS
  Filled 2016-04-11 (×5): qty 500

## 2016-04-11 MED ORDER — MUPIROCIN 2 % EX OINT
1.0000 "application " | TOPICAL_OINTMENT | Freq: Two times a day (BID) | CUTANEOUS | Status: AC
  Administered 2016-04-11 – 2016-04-15 (×10): 1 via NASAL
  Filled 2016-04-11: qty 22

## 2016-04-11 NOTE — Progress Notes (Signed)
Pharmacy Antibiotic Note  Heidi [name] is a 30 y.o. female admitted on 04/10/2016 with pneumonia.  Pharmacy has been consulted for Vancomycin dosing.  Plan: Vancomycin 500mg  IV every 8 hours.  Goal trough 15-20 mcg/mL.  Height: 5\' 6"  (167.6 cm) Weight: 136 lb 7.4 oz (61.9 kg) IBW/kg (Calculated) : 59.3  Temp (24hrs), Avg:98.7 F (37.1 C), Min:98.1 F (36.7 C), Max:99.6 F (37.6 C)   Recent Labs Lab 04/10/16 1930 04/10/16 1953 04/11/16 0333  WBC 9.5  --  9.5  CREATININE 0.58  --  0.47  LATICACIDVEN  --  1.50  --     Estimated Creatinine Clearance: 96.3 mL/min (by C-G formula based on Cr of 0.47).    No Known Allergies  Antimicrobials this admission: Vancomycin 04/10/2016 >> Cefepime 04/10/2016 >>   Dose adjustments this admission: -  Microbiology results: Pending  Thank you for allowing pharmacy to be a part of this patient's care.  Nani Skillern Crowford 04/11/2016 6:06 AM

## 2016-04-11 NOTE — Progress Notes (Signed)
Utilization review completed.  

## 2016-04-11 NOTE — Progress Notes (Signed)
[facility] PROGRESS NOTE  CARESS GIACOMINI [id number] DOB: May 22, 1986 DOA: 04/10/2016 PCP: No primary care provider on file.  Assessment/Plan: 1. Possible postobstructive pneumonia versus hospital-acquired pneumonia -Mrs. Hochstrasser is a 30 year old female recently diagnosed with Hodgkin's lymphoma, undergoing biopsy of right axillary lymph node on 03/19/2016 -She presented with worsening shortness of breath as chest x-ray showing worsening consolidation throughout majority of right lung. -Findings could be secondary to postobstructive pneumonia versus healthcare associated pneumonia given recent hospitalization. This could also represent malignancy. -Plan to continue close monitoring in the step down unit -Will follow-up on blood cultures -Plan to repeat a chest x-ray in a.m. -Continue broad-spectrum IV antimicrobial therapy with cefepime and vancomycin  2.  Hodgkin's lymphoma. -Received CT imaging of chest revealed extensive lymphadenopathy throughout hilum mediastinum right axillary region for which she underwent right axillary lymph node biopsy by interventional radiology on 03/16/2016. -Pathology reporting Hodgkin's lymphoma. -Plan had been for follow-up with medical oncology and outpatient staging PET/CT scan. She has not been started on systemic therapy.  3.  Acute hypoxemic respiratory failure -Evidence by a respiratory rate of 27, having no to that of 92% on 6 L nasal cannula, presenting with respiratory distress -Likely secondary to healthcare associated pneumonia versus postobstructive pneumonia -Continue treating pneumonia with IV antibiotics, provide supplemental oxygen, close monitoring in the step down unit  Code Status: Full code  Family Communication: Family not present  Disposition Plan: Close monitoring in the stepdown unit   Antibiotics:  IV vancomycin  IV cefepime  HPI/Subjective: Mrs. Halley is a 30 year old female with a past medical history of  lymphoma who was recently hospitalized for pneumonia and acute respiratory distress syndrome from 03/17/2015 through 03/24/2015. She underwent rapid sequence intubation during that hospitalization on 03/17/2016. She also underwent surgical biopsy of right axillary lymph node on 03/19/2016 and bronchoscopy. Pathology revealed Hodgkin's lymphoma. She presented to the emergency department on 04/10/2016 with complaints of shortness of breath. A chest x-ray revealed interval worsening of consolidation throughout a majority of the right lung which could represent postobstructive pneumonia. She was started on broad-spectrum IV antimicrobial therapy with cefepime and vancomycin  Objective: Filed Vitals:   04/11/16 1100 04/11/16 1200  BP:    Pulse: 103 102  Temp:    Resp: 24 27    Intake/Output Summary (Last 24 hours) at 04/11/16 1411 Last data filed at 04/11/16 1200  Gross per 24 hour  Intake   1045 ml  Output   1110 ml  Net    -65 ml   Filed Weights   04/10/16 2300 04/11/16 0550  Weight: 62.4 kg (137 lb 9.1 oz) 61.9 kg (136 lb 7.4 oz)    Exam:   General:  Patient in mild distress, appears dyspneic at rest, she is awake and alert however   Cardiovascular: Regular rate and rhythm normal S1-S2   Respiratory: She has coarse respiratory sounds, bilateral rhonchi more pronounced on right, decreased breath sounds to right.   Abdomen: Soft nontender nondistended   Musculoskeletal: No edema  Data Reviewed: Basic Metabolic Panel:  Recent Labs Lab 04/10/16 1930 04/11/16 0333  NA 128* 130*  K 4.4 4.2  CL 87* 96*  CO2 27 29  GLUCOSE 84 123*  BUN 8 5*  CREATININE 0.58 0.47  CALCIUM 8.2* 7.9*   Liver Function Tests:  Recent Labs Lab 04/10/16 1930  AST 16  ALT 15  ALKPHOS 107  BILITOT 0.1*  PROT 5.1*  ALBUMIN 2.2*   No results for input(s): LIPASE, AMYLASE in  the last 168 hours. No results for input(s): AMMONIA in the last 168 hours. CBC:  Recent Labs Lab 04/10/16 1930  04/11/16 0333  WBC 9.5 9.5  NEUTROABS 8.0*  --   HGB 11.9* 12.2  HCT 35.0* 35.5*  MCV 87.1 89.2  PLT 565* 622*   Cardiac Enzymes: No results for input(s): CKTOTAL, CKMB, CKMBINDEX, TROPONINI in the last 168 hours. BNP (last 3 results) No results for input(s): BNP in the last 8760 hours.  ProBNP (last 3 results) No results for input(s): PROBNP in the last 8760 hours.  CBG: No results for input(s): GLUCAP in the last 168 hours.  Recent Results (from the past 240 hour(s))  MRSA PCR Screening     Status: Abnormal   Collection Time: 04/11/16 12:56 AM  Result Value Ref Range Status   MRSA by PCR POSITIVE (A) NEGATIVE Final    Comment:        The GeneXpert MRSA Assay (FDA approved for NASAL specimens only), is one component of a comprehensive MRSA colonization surveillance program. It is not intended to diagnose MRSA infection nor to guide or monitor treatment for MRSA infections. RESULT CALLED TO, READ BACK BY AND VERIFIED WITHLottie Dawson RN [id number] 04/11/16 A NAVARRO      Studies: Dg Chest 2 View  04/10/2016  CLINICAL DATA:  Patient with shortness of breath for multiple weeks. Recently diagnosed with lymphoma. EXAM: CHEST  2 VIEW COMPARISON:  Chest CT 03/16/2016; chest radiograph 03/22/2016. FINDINGS: Multiple monitoring leads overlie the patient. Stable cardiac and mediastinal contours with extensive bilateral hilar and mediastinal adenopathy. Significant worsening of consolidation within the right lung. Persistent consolidation within the left mid lung with associated small nodular opacities. Possible right pleural effusion. No pneumothorax. Regional skeleton is unremarkable. IMPRESSION: Interval worsening of consolidation throughout the majority of the right lung which may represent malignancy, postobstructive pneumonia and/or effusion. Persistent hilar and mediastinal adenopathy. Unchanged scattered opacities and consolidation within the left lung. Electronically Signed   By: Lovey Newcomer M.D.   On: 04/10/2016 19:32   Ct Angio Chest Pe W/cm &/or Wo Cm  04/10/2016  CLINICAL DATA:  Acute onset of worsening shortness of breath. Bilateral leg swelling. Recently diagnosed with Hodgkin's lymphoma. Initial encounter. EXAM: CT ANGIOGRAPHY CHEST WITH CONTRAST TECHNIQUE: Multidetector CT imaging of the chest was performed using the standard protocol during bolus administration of intravenous contrast. Multiplanar CT image reconstructions and MIPs were obtained to evaluate the vascular anatomy. CONTRAST:  100 mL of Isovue 370 IV contrast COMPARISON:  CT of the chest performed 03/16/2016, and chest radiograph performed 04/10/2016 FINDINGS: There is no evidence of pulmonary embolus. There is near complete dense consolidation of the right lung, and central consolidation involving the left upper lobe, with additional scattered nodular opacities throughout the left lung. This is significantly worsened from the prior CT, reflecting worsening bilateral pneumonia. On correlation with right axillary lymph node biopsy results, the patient was recently diagnosed with classic Hodgkin's lymphoma. Hodgkin's lymphoma typically would not narrow the airway to the extent required for postobstructive pneumonia, and is likely unrelated to the patient's presentation with diffuse bilateral pneumonia. There is no evidence of pleural effusion or pneumothorax. Extensive enlarged mediastinal nodes are seen, particularly anteriorly, measuring up to 1.8 cm in short axis. Right axillary nodes measure up to 2.4 cm in short axis. These are compatible with the patient's Hodgkin's lymphoma. A small 3.8 cm collection of fluid at the right axilla likely reflects a postoperative seroma, status post recent right  hilar lymph node biopsy. No pericardial effusion is identified. The great vessels are grossly unremarkable in appearance. The visualized portions of the thyroid gland are unremarkable in appearance. The visualized portions of the  liver and spleen are unremarkable. No acute osseous abnormalities are seen. Review of the MIP images confirms the above findings. IMPRESSION: 1. No evidence of pulmonary embolus. 2. Near complete dense consolidation of the right lung, and central consolidation involving the left upper lobe, with scattered nodular opacities throughout the left lung. This is significantly worsened from the prior CT, reflecting worsening bilateral pneumonia. 3. The patient was recently diagnosed with classic Hodgkin's lymphoma for right axillary lymph node biopsy results. The Hodgkin's lymphoma is likely unrelated to the patient's diffuse bilateral pneumonia, as lymph nodes typically do not obstruct the tracheobronchial tree. Enlarged mediastinal and right axillary nodes reflect the patient's lymphoma. 4. 3.8 cm collection of fluid at the right axilla likely reflects a postoperative seroma, status post recent right hilar lymph node biopsy. These results were called by telephone at the time of interpretation on 04/10/2016 at 9:19 pm to [facility] PA, who verbally acknowledged these results. Electronically Signed   By: Garald Balding M.D.   On: 04/10/2016 21:26    Scheduled Meds: . ceFEPime (MAXIPIME) IV  1 g Intravenous 3 times per day  . Chlorhexidine Gluconate Cloth  6 each Topical Q0600  . enoxaparin (LOVENOX) injection  40 mg Subcutaneous QHS  . labetalol  100 mg Oral BID  . mupirocin ointment  1 application Nasal BID  . vancomycin  500 mg Intravenous Q8H   Continuous Infusions:   Principal Problem:   HCAP (healthcare-associated pneumonia) Active Problems:   Acute respiratory failure ([place])   Lymphoma ([place])   Hyponatremia   Thrombocytosis ([place])    Time spent: 35 minutes   Kelvin Cellar  [facility] Pager [phone]. If 7PM-7AM, please contact night-coverage at [email or web address], password [facility] 04/11/2016, 2:11 PM  LOS: 1 day

## 2016-04-12 DIAGNOSIS — C8199 Hodgkin lymphoma, unspecified, extranodal and solid organ sites: Secondary | ICD-10-CM

## 2016-04-12 LAB — CBC
HCT: 37.9 % (ref 36.0–46.0)
Hemoglobin: 11.9 g/dL — ABNORMAL LOW (ref 12.0–15.0)
MCH: 29.8 pg (ref 26.0–34.0)
MCHC: 31.4 g/dL (ref 30.0–36.0)
MCV: 94.8 fL (ref 78.0–100.0)
PLATELETS: 590 10*3/uL — AB (ref 150–400)
RBC: 4 MIL/uL (ref 3.87–5.11)
RDW: 14.5 % (ref 11.5–15.5)
WBC: 10.2 10*3/uL (ref 4.0–10.5)

## 2016-04-12 LAB — VANCOMYCIN, TROUGH: VANCOMYCIN TR: 8 ug/mL — AB (ref 10.0–20.0)

## 2016-04-12 LAB — BASIC METABOLIC PANEL
ANION GAP: 6 (ref 5–15)
BUN: <5 mg/dL — ABNORMAL LOW (ref 6–20)
CO2: 33 mmol/L — ABNORMAL HIGH (ref 22–32)
Calcium: 8 mg/dL — ABNORMAL LOW (ref 8.9–10.3)
Chloride: 96 mmol/L — ABNORMAL LOW (ref 101–111)
Creatinine, Ser: 0.46 mg/dL (ref 0.44–1.00)
GLUCOSE: 122 mg/dL — AB (ref 65–99)
POTASSIUM: 4.7 mmol/L (ref 3.5–5.1)
Sodium: 135 mmol/L (ref 135–145)

## 2016-04-12 MED ORDER — SODIUM CHLORIDE 0.9 % IV BOLUS (SEPSIS)
1000.0000 mL | Freq: Once | INTRAVENOUS | Status: AC
  Administered 2016-04-12: 1000 mL via INTRAVENOUS

## 2016-04-12 MED ORDER — VANCOMYCIN HCL IN DEXTROSE 1-5 GM/200ML-% IV SOLN
1000.0000 mg | Freq: Three times a day (TID) | INTRAVENOUS | Status: DC
  Administered 2016-04-12 – 2016-04-14 (×6): 1000 mg via INTRAVENOUS
  Filled 2016-04-12 (×5): qty 200

## 2016-04-12 MED ORDER — SODIUM CHLORIDE 0.9 % IV SOLN
INTRAVENOUS | Status: DC
  Administered 2016-04-12: 125 mL/h via INTRAVENOUS
  Administered 2016-04-13: 13:00:00 via INTRAVENOUS

## 2016-04-12 MED ORDER — DEXTROSE 5 % IV SOLN
2.0000 g | Freq: Three times a day (TID) | INTRAVENOUS | Status: AC
  Administered 2016-04-12 – 2016-04-18 (×20): 2 g via INTRAVENOUS
  Filled 2016-04-12 (×22): qty 2

## 2016-04-12 NOTE — Progress Notes (Signed)
Patient complained of light-headedness. Hr 100-130 at rest, and increased with slight movement.  Significant shob with minimal activity, such as getting on the bedpan.  Patient can no longer tolerate getting up to the bedside commode. Also has shob when eating.  O2 sats 93-98 on  6 liters nasal cannula.  Notified MD of light-headedness.

## 2016-04-12 NOTE — Progress Notes (Signed)
[facility] PROGRESS NOTE  Heidi [name] [id number] DOB: 1986/10/12 DOA: 04/10/2016 PCP: No primary care provider on file.  Assessment/Plan: 1. Possible postobstructive pneumonia versus hospital-acquired pneumonia -Heidi [name] is a 30 year old female recently diagnosed with Hodgkin's lymphoma, undergoing biopsy of right axillary lymph node on 03/19/2016 -She presented with worsening shortness of breath as chest x-ray showing worsening consolidation throughout majority of right lung. -Findings could be secondary to postobstructive pneumonia versus healthcare associated pneumonia given recent hospitalization.  -Portable chest x-ray performed 04/11/2016 showing interval worsening opacities in left upper lung and persistent near opacification of right hemithorax. Radios report and findings compatible with extensive multifocal pneumonia. -Overnight had MAXIMUM TEMPERATURE of 100.9 -Continue broad-spectrum IV antimicrobial therapy with cefepime and vancomycin and close monitoring in the step down unit. -Blood cultures drawn 04/11/2016 showing no growth after overnight incubation  2.  Hodgkin's lymphoma. -Received CT imaging of chest revealed extensive lymphadenopathy throughout hilum mediastinum right axillary region for which she underwent right axillary lymph node biopsy by interventional radiology on 03/16/2016. -Pathology reporting Hodgkin's lymphoma. -Plan had been for follow-up with medical oncology and outpatient staging PET/CT scan. She has not been started on systemic therapy.  3.  Acute hypoxemic respiratory failure -Evidence by a respiratory rate of 27, having no to that of 92% on 6 L nasal cannula, presenting with respiratory distress -Likely secondary to healthcare associated pneumonia versus postobstructive pneumonia -She continues to require 6 L of nasal cannula -Repeat chest x-ray on 04/11/2016 showing worsening of extensive multifocal pneumonia -Continue treating  pneumonia with IV antibiotics, provide supplemental oxygen, close monitoring in the step down unit  Code Status: Full code  Family Communication: Family not present  Disposition Plan: Close monitoring in the stepdown unit   Antibiotics:  IV vancomycin  IV cefepime  HPI/Subjective: Heidi [name] is a 30 year old female with a past medical history of lymphoma who was recently hospitalized for pneumonia and acute respiratory distress syndrome from 03/17/2015 through 03/24/2015. She underwent rapid sequence intubation during that hospitalization on 03/17/2016. She also underwent surgical biopsy of right axillary lymph node on 03/19/2016 and bronchoscopy. Pathology revealed Hodgkin's lymphoma. She presented to the emergency department on 04/10/2016 with complaints of shortness of breath. A chest x-ray revealed interval worsening of consolidation throughout a majority of the right lung which could represent postobstructive pneumonia. She was started on broad-spectrum IV antimicrobial therapy with cefepime and vancomycin  Objective: Filed Vitals:   04/12/16 1130 04/12/16 1200  BP: 176/90   Pulse: 131 109  Temp:  98.7 F (37.1 C)  Resp:  27    Intake/Output Summary (Last 24 hours) at 04/12/16 1426 Last data filed at 04/12/16 1200  Gross per 24 hour  Intake   1030 ml  Output   1100 ml  Net    -70 ml   Filed Weights   04/10/16 2300 04/11/16 0550  Weight: 62.4 kg (137 lb 9.1 oz) 61.9 kg (136 lb 7.4 oz)    Exam:   General:  Patient in mild distress, appears dyspneic at rest, she is awake and alert however   Cardiovascular: Regular rate and rhythm normal S1-S2   Respiratory: She has coarse respiratory sounds, bilateral rhonchi more pronounced on right, decreased breath sounds to right.   Abdomen: Soft nontender nondistended   Musculoskeletal: No edema  Data Reviewed: Basic Metabolic Panel:  Recent Labs Lab 04/10/16 1930 04/11/16 0333 04/12/16 0316  NA 128* 130* 135  K  4.4 4.2 4.7  CL 87* 96* 96*  CO2 27  29 33*  GLUCOSE 84 123* 122*  BUN 8 5* <5*  CREATININE 0.58 0.47 0.46  CALCIUM 8.2* 7.9* 8.0*   Liver Function Tests:  Recent Labs Lab 04/10/16 1930  AST 16  ALT 15  ALKPHOS 107  BILITOT 0.1*  PROT 5.1*  ALBUMIN 2.2*   No results for input(s): LIPASE, AMYLASE in the last 168 hours. No results for input(s): AMMONIA in the last 168 hours. CBC:  Recent Labs Lab 04/10/16 1930 04/11/16 0333 04/12/16 0316  WBC 9.5 9.5 10.2  NEUTROABS 8.0*  --   --   HGB 11.9* 12.2 11.9*  HCT 35.0* 35.5* 37.9  MCV 87.1 89.2 94.8  PLT 565* 622* 590*   Cardiac Enzymes: No results for input(s): CKTOTAL, CKMB, CKMBINDEX, TROPONINI in the last 168 hours. BNP (last 3 results) No results for input(s): BNP in the last 8760 hours.  ProBNP (last 3 results) No results for input(s): PROBNP in the last 8760 hours.  CBG: No results for input(s): GLUCAP in the last 168 hours.  Recent Results (from the past 240 hour(s))  MRSA PCR Screening     Status: Abnormal   Collection Time: 04/11/16 12:56 AM  Result Value Ref Range Status   MRSA by PCR POSITIVE (A) NEGATIVE Final    Comment:        The GeneXpert MRSA Assay (FDA approved for NASAL specimens only), is one component of a comprehensive MRSA colonization surveillance program. It is not intended to diagnose MRSA infection nor to guide or monitor treatment for MRSA infections. RESULT CALLED TO, READ BACK BY AND VERIFIED WITHLottie Dawson RN [id number] 04/11/16 A NAVARRO      Studies: Dg Chest 1 View  04/11/2016  CLINICAL DATA:  Shortness of breath.  History of lymphoma. EXAM: CHEST 1 VIEW COMPARISON:  Chest radiograph 04/10/2016; chest CT 04/10/2016 FINDINGS: Grossly unchanged diffuse opacification of the right hemi thorax with worsening opacities involving the left mid and upper lung. No definite pleural effusion or pneumothorax. Regional skeleton is unremarkable. IMPRESSION: Interval worsening opacities left  upper lung with persistent near complete opacification of the right hemi thorax, findings overall compatible with extensive multi focal pneumonia. Known mediastinal and hilar adenopathy. Electronically Signed   By: Lovey Newcomer M.D.   On: 04/11/2016 17:03   Dg Chest 2 View  04/10/2016  CLINICAL DATA:  Patient with shortness of breath for multiple weeks. Recently diagnosed with lymphoma. EXAM: CHEST  2 VIEW COMPARISON:  Chest CT 03/16/2016; chest radiograph 03/22/2016. FINDINGS: Multiple monitoring leads overlie the patient. Stable cardiac and mediastinal contours with extensive bilateral hilar and mediastinal adenopathy. Significant worsening of consolidation within the right lung. Persistent consolidation within the left mid lung with associated small nodular opacities. Possible right pleural effusion. No pneumothorax. Regional skeleton is unremarkable. IMPRESSION: Interval worsening of consolidation throughout the majority of the right lung which may represent malignancy, postobstructive pneumonia and/or effusion. Persistent hilar and mediastinal adenopathy. Unchanged scattered opacities and consolidation within the left lung. Electronically Signed   By: Lovey Newcomer M.D.   On: 04/10/2016 19:32   Ct Angio Chest Pe W/cm &/or Wo Cm  04/10/2016  CLINICAL DATA:  Acute onset of worsening shortness of breath. Bilateral leg swelling. Recently diagnosed with Hodgkin's lymphoma. Initial encounter. EXAM: CT ANGIOGRAPHY CHEST WITH CONTRAST TECHNIQUE: Multidetector CT imaging of the chest was performed using the standard protocol during bolus administration of intravenous contrast. Multiplanar CT image reconstructions and MIPs were obtained to evaluate the vascular anatomy. CONTRAST:  100 mL  of Isovue 370 IV contrast COMPARISON:  CT of the chest performed 03/16/2016, and chest radiograph performed 04/10/2016 FINDINGS: There is no evidence of pulmonary embolus. There is near complete dense consolidation of the right lung,  and central consolidation involving the left upper lobe, with additional scattered nodular opacities throughout the left lung. This is significantly worsened from the prior CT, reflecting worsening bilateral pneumonia. On correlation with right axillary lymph node biopsy results, the patient was recently diagnosed with classic Hodgkin's lymphoma. Hodgkin's lymphoma typically would not narrow the airway to the extent required for postobstructive pneumonia, and is likely unrelated to the patient's presentation with diffuse bilateral pneumonia. There is no evidence of pleural effusion or pneumothorax. Extensive enlarged mediastinal nodes are seen, particularly anteriorly, measuring up to 1.8 cm in short axis. Right axillary nodes measure up to 2.4 cm in short axis. These are compatible with the patient's Hodgkin's lymphoma. A small 3.8 cm collection of fluid at the right axilla likely reflects a postoperative seroma, status post recent right hilar lymph node biopsy. No pericardial effusion is identified. The great vessels are grossly unremarkable in appearance. The visualized portions of the thyroid gland are unremarkable in appearance. The visualized portions of the liver and spleen are unremarkable. No acute osseous abnormalities are seen. Review of the MIP images confirms the above findings. IMPRESSION: 1. No evidence of pulmonary embolus. 2. Near complete dense consolidation of the right lung, and central consolidation involving the left upper lobe, with scattered nodular opacities throughout the left lung. This is significantly worsened from the prior CT, reflecting worsening bilateral pneumonia. 3. The patient was recently diagnosed with classic Hodgkin's lymphoma for right axillary lymph node biopsy results. The Hodgkin's lymphoma is likely unrelated to the patient's diffuse bilateral pneumonia, as lymph nodes typically do not obstruct the tracheobronchial tree. Enlarged mediastinal and right axillary nodes  reflect the patient's lymphoma. 4. 3.8 cm collection of fluid at the right axilla likely reflects a postoperative seroma, status post recent right hilar lymph node biopsy. These results were called by telephone at the time of interpretation on 04/10/2016 at 9:19 pm to [facility] PA, who verbally acknowledged these results. Electronically Signed   By: Garald Balding M.D.   On: 04/10/2016 21:26    Scheduled Meds: . ceFEPime (MAXIPIME) IV  2 g Intravenous 3 times per day  . Chlorhexidine Gluconate Cloth  6 each Topical Q0600  . enoxaparin (LOVENOX) injection  40 mg Subcutaneous QHS  . labetalol  100 mg Oral BID  . mupirocin ointment  1 application Nasal BID  . vancomycin  500 mg Intravenous Q8H   Continuous Infusions:   Principal Problem:   HCAP (healthcare-associated pneumonia) Active Problems:   Acute respiratory failure ([place])   Lymphoma ([place])   Hyponatremia   Thrombocytosis ([place])    Time spent: 35 minutes   Kelvin Cellar  [facility] Pager [phone]. If 7PM-7AM, please contact night-coverage at [email or web address], password [facility] 04/12/2016, 2:26 PM  LOS: 2 days

## 2016-04-12 NOTE — Progress Notes (Signed)
Pharmacy Antibiotic Note  Heidi [name] is a 30 y.o. female with recent diagnosis of Hodgkin's lymphoma and recently discharged from the hospital on 3/28,  admitted on 04/10/2016 with pneumonia.  Patient is currently on cefepime and vancomycin day #2 for PNA.  - 4/16 CXR: extensive multi focal PNA - Tmax 100.9, WBC WNL, SCr stable (CrCl 96), ANC high  Plan: - Increase Vancomycin to 1g IV q8h due to subtherapeutic trough level.  - Plan to re-check trough at new steady state. - Continue Cefepime 2g IV q8h.  - Continue to monitor renal function, cultures, clinical course.  ___________________  Height: 5\' 6"  (167.6 cm) Weight: 136 lb 7.4 oz (61.9 kg) IBW/kg (Calculated) : 59.3  Temp (24hrs), Avg:99.9 F (37.7 C), Min:98.7 F (37.1 C), Max:100.9 F (38.3 C)   Recent Labs Lab 04/10/16 1930 04/10/16 1953 04/11/16 0333 04/12/16 0316 04/12/16 1533  WBC 9.5  --  9.5 10.2  --   CREATININE 0.58  --  0.47 0.46  --   LATICACIDVEN  --  1.50  --   --   --   VANCOTROUGH  --   --   --   --  8*    Estimated Creatinine Clearance: 96.3 mL/min (by C-G formula based on Cr of 0.46).    No Known Allergies  Antimicrobials this admission: 4/15 Vancomycin  >> 4/15 Cefepime  >>   Dose adjustments this admission: 4/17: changed cefepime to 2g q8h for HAP 4/17: VT = 8 mcg/mL on 500mg  q8h, change to 1g q8h  Microbiology results: 4/16 MRSA PCR (+) on chlx and bactroban 4/16 bcx x2: NGTD 4/16 strep pneu (-)   Thank you for allowing pharmacy to be a part of this patient's care.   Lindell Spar, PharmD, BCPS Pager: [phone] 04/12/2016 4:54 PM

## 2016-04-12 NOTE — Progress Notes (Signed)
Pharmacy Antibiotic Note  Heidi [name] is a 30 y.o. female with recent diagnosis of Hodgkin's lymphoma and recently discharged from the hospital on 3/28,  admitted on 04/10/2016 pneumonia.  Patient's currently on cefepime and vancomycin day #2 for PNA.  - 4/16 CXR: extensive multi focal PNA - Tmax 100.9, WBC WNL, SCr stable (CrCl 96), ANC high  Plan: - continue vancomycin 500 mg IV q8h. Will check level this afternoon to assess current regimen - with recent hospitalization and suspicion for HAP, will increase cefeoime to 2 gm IV q8h   ____________________  Height: 5\' 6"  (167.6 cm) Weight: 136 lb 7.4 oz (61.9 kg) IBW/kg (Calculated) : 59.3  Temp (24hrs), Avg:99.5 F (37.5 C), Min:98.1 F (36.7 C), Max:100.9 F (38.3 C)   Recent Labs Lab 04/10/16 1930 04/10/16 1953 04/11/16 0333 04/12/16 0316  WBC 9.5  --  9.5 10.2  CREATININE 0.58  --  0.47 0.46  LATICACIDVEN  --  1.50  --   --     Estimated Creatinine Clearance: 96.3 mL/min (by C-G formula based on Cr of 0.46).    No Known Allergies  Antimicrobials this admission: 4/15 Vancomycin  >> 4/15 Cefepime  >>   Dose adjustments this admission: 4/17: changed cefepime to 2 gm q8h for HAP  Microbiology results: 4/16 MRSA PCR (+) on chlx and bactroban 4/16 bcx x2:  4/16 strep pneu (-)   Thank you for allowing pharmacy to be a part of this patient's care.  Lynelle Doctor 04/12/2016 10:18 AM

## 2016-04-13 ENCOUNTER — Inpatient Hospital Stay (HOSPITAL_COMMUNITY): Payer: Self-pay

## 2016-04-13 ENCOUNTER — Encounter (HOSPITAL_COMMUNITY): Payer: Self-pay | Admitting: Pulmonary Disease

## 2016-04-13 ENCOUNTER — Inpatient Hospital Stay (HOSPITAL_COMMUNITY): Payer: MEDICAID

## 2016-04-13 DIAGNOSIS — J189 Pneumonia, unspecified organism: Principal | ICD-10-CM

## 2016-04-13 DIAGNOSIS — J9601 Acute respiratory failure with hypoxia: Secondary | ICD-10-CM

## 2016-04-13 DIAGNOSIS — J9602 Acute respiratory failure with hypercapnia: Secondary | ICD-10-CM

## 2016-04-13 LAB — BASIC METABOLIC PANEL
Anion gap: 6 (ref 5–15)
CALCIUM: 8.1 mg/dL — AB (ref 8.9–10.3)
CO2: 36 mmol/L — ABNORMAL HIGH (ref 22–32)
CREATININE: 0.47 mg/dL (ref 0.44–1.00)
Chloride: 91 mmol/L — ABNORMAL LOW (ref 101–111)
Glucose, Bld: 138 mg/dL — ABNORMAL HIGH (ref 65–99)
Potassium: 5.2 mmol/L — ABNORMAL HIGH (ref 3.5–5.1)
SODIUM: 133 mmol/L — AB (ref 135–145)

## 2016-04-13 LAB — BLOOD GAS, ARTERIAL
ACID-BASE EXCESS: 3.4 mmol/L — AB (ref 0.0–2.0)
Bicarbonate: 32.6 mEq/L — ABNORMAL HIGH (ref 20.0–24.0)
DRAWN BY: 308601
Drawn by: 295031
FIO2: 0.6
LHR: 16 {breaths}/min
MECHVT: 480 mL
O2 CONTENT: 10 L/min
O2 Saturation: 85.3 %
O2 Saturation: 95 %
PATIENT TEMPERATURE: 98.6
PCO2 ART: 79.1 mmHg — AB (ref 35.0–45.0)
PEEP/CPAP: 5 cmH2O
PO2 ART: 64.6 mmHg — AB (ref 80.0–100.0)
PO2 ART: 84.3 mmHg (ref 80.0–100.0)
Patient temperature: 98.6
TCO2: 30.8 mmol/L (ref 0–100)
pH, Arterial: 7.13 — CL (ref 7.350–7.450)
pH, Arterial: 7.238 — ABNORMAL LOW (ref 7.350–7.450)

## 2016-04-13 LAB — GLUCOSE, CAPILLARY
GLUCOSE-CAPILLARY: 57 mg/dL — AB (ref 65–99)
Glucose-Capillary: 82 mg/dL (ref 65–99)

## 2016-04-13 LAB — LACTIC ACID, PLASMA: Lactic Acid, Venous: 1.3 mmol/L (ref 0.5–2.0)

## 2016-04-13 LAB — CBC
HCT: 37.5 % (ref 36.0–46.0)
Hemoglobin: 11.6 g/dL — ABNORMAL LOW (ref 12.0–15.0)
MCH: 29.7 pg (ref 26.0–34.0)
MCHC: 30.9 g/dL (ref 30.0–36.0)
MCV: 95.9 fL (ref 78.0–100.0)
PLATELETS: 626 10*3/uL — AB (ref 150–400)
RBC: 3.91 MIL/uL (ref 3.87–5.11)
RDW: 14.2 % (ref 11.5–15.5)
WBC: 10.3 10*3/uL (ref 4.0–10.5)

## 2016-04-13 LAB — TRIGLYCERIDES: TRIGLYCERIDES: 43 mg/dL (ref <150)

## 2016-04-13 LAB — PROCALCITONIN: Procalcitonin: 0.17 ng/mL

## 2016-04-13 MED ORDER — PROPOFOL 1000 MG/100ML IV EMUL
INTRAVENOUS | Status: AC
  Administered 2016-04-13: 50 ug/min
  Filled 2016-04-13: qty 100

## 2016-04-13 MED ORDER — MIDAZOLAM HCL 2 MG/2ML IJ SOLN
2.0000 mg | INTRAMUSCULAR | Status: DC | PRN
  Administered 2016-04-16 – 2016-04-17 (×5): 2 mg via INTRAVENOUS
  Filled 2016-04-13 (×5): qty 2

## 2016-04-13 MED ORDER — PANTOPRAZOLE SODIUM 40 MG PO PACK
40.0000 mg | PACK | ORAL | Status: DC
  Administered 2016-04-13 – 2016-04-18 (×6): 40 mg
  Filled 2016-04-13 (×9): qty 20

## 2016-04-13 MED ORDER — ANTISEPTIC ORAL RINSE SOLUTION (CORINZ)
7.0000 mL | OROMUCOSAL | Status: DC
  Administered 2016-04-13 – 2016-04-18 (×51): 7 mL via OROMUCOSAL

## 2016-04-13 MED ORDER — FENTANYL BOLUS VIA INFUSION
50.0000 ug | INTRAVENOUS | Status: DC | PRN
  Administered 2016-04-14: 30 ug via INTRAVENOUS
  Administered 2016-04-14 – 2016-04-16 (×3): 50 ug via INTRAVENOUS
  Administered 2016-04-16: 25 ug via INTRAVENOUS
  Administered 2016-04-17 (×2): 50 ug via INTRAVENOUS
  Filled 2016-04-13: qty 50

## 2016-04-13 MED ORDER — VITAL HIGH PROTEIN PO LIQD
1000.0000 mL | ORAL | Status: DC
  Administered 2016-04-13 – 2016-04-14 (×3): 1000 mL
  Filled 2016-04-13 (×4): qty 1000

## 2016-04-13 MED ORDER — VITAL HIGH PROTEIN PO LIQD
1000.0000 mL | ORAL | Status: DC
  Filled 2016-04-13: qty 1000

## 2016-04-13 MED ORDER — FENTANYL CITRATE (PF) 100 MCG/2ML IJ SOLN
INTRAMUSCULAR | Status: AC
  Administered 2016-04-13: 100 ug
  Filled 2016-04-13: qty 2

## 2016-04-13 MED ORDER — DEXTROSE 50 % IV SOLN
INTRAVENOUS | Status: AC
Start: 2016-04-13 — End: 2016-04-13
  Administered 2016-04-13: 25 mL
  Filled 2016-04-13: qty 50

## 2016-04-13 MED ORDER — VITAMINS A & D EX OINT
TOPICAL_OINTMENT | CUTANEOUS | Status: AC
  Filled 2016-04-13: qty 5

## 2016-04-13 MED ORDER — PROPOFOL 1000 MG/100ML IV EMUL
0.0000 ug/kg/min | INTRAVENOUS | Status: DC
  Administered 2016-04-13: 50 ug/kg/min via INTRAVENOUS
  Administered 2016-04-13: 40 ug/kg/min via INTRAVENOUS
  Administered 2016-04-13 (×2): 50 ug/kg/min via INTRAVENOUS
  Administered 2016-04-14 (×4): 40 ug/kg/min via INTRAVENOUS
  Administered 2016-04-15 – 2016-04-18 (×13): 50 ug/kg/min via INTRAVENOUS
  Filled 2016-04-13 (×21): qty 100

## 2016-04-13 MED ORDER — INSULIN ASPART 100 UNIT/ML ~~LOC~~ SOLN
0.0000 [IU] | SUBCUTANEOUS | Status: DC
  Administered 2016-04-15 – 2016-04-16 (×9): 2 [IU] via SUBCUTANEOUS
  Administered 2016-04-16: 3 [IU] via SUBCUTANEOUS
  Administered 2016-04-16 – 2016-04-17 (×6): 2 [IU] via SUBCUTANEOUS
  Administered 2016-04-17: 3 [IU] via SUBCUTANEOUS
  Administered 2016-04-17 – 2016-04-19 (×3): 2 [IU] via SUBCUTANEOUS
  Administered 2016-04-19: 3 [IU] via SUBCUTANEOUS
  Administered 2016-04-19: 2 [IU] via SUBCUTANEOUS
  Administered 2016-04-20 (×2): 3 [IU] via SUBCUTANEOUS
  Administered 2016-04-21 – 2016-04-22 (×4): 2 [IU] via SUBCUTANEOUS
  Administered 2016-04-22: 3 [IU] via SUBCUTANEOUS

## 2016-04-13 MED ORDER — CHLORHEXIDINE GLUCONATE 0.12% ORAL RINSE (MEDLINE KIT)
15.0000 mL | Freq: Two times a day (BID) | OROMUCOSAL | Status: DC
  Administered 2016-04-13 – 2016-04-18 (×12): 15 mL via OROMUCOSAL

## 2016-04-13 MED ORDER — ACETAMINOPHEN 160 MG/5ML PO SOLN: 650.0000 mg | Freq: Four times a day (QID) | ORAL | Status: DC | PRN

## 2016-04-13 MED ORDER — SODIUM CHLORIDE 0.9 % IV SOLN: 25.0000 ug/h | INTRAVENOUS | Status: DC

## 2016-04-13 MED ORDER — DEXTROSE 50 % IV SOLN
INTRAVENOUS | Status: AC
  Administered 2016-04-13: 25 mL
  Filled 2016-04-13: qty 50

## 2016-04-13 MED ORDER — SODIUM CHLORIDE 0.9 % IV SOLN
25.0000 ug/h | INTRAVENOUS | Status: DC
  Administered 2016-04-13: 100 ug/h via INTRAVENOUS
  Administered 2016-04-13: 150 ug/h via INTRAVENOUS
  Administered 2016-04-14: 175 ug/h via INTRAVENOUS
  Administered 2016-04-14: 100 ug/h via INTRAVENOUS
  Administered 2016-04-15: 200 ug/h via INTRAVENOUS
  Administered 2016-04-16 (×3): 250 ug/h via INTRAVENOUS
  Administered 2016-04-17: 300 ug/h via INTRAVENOUS
  Administered 2016-04-17: 150 ug/h via INTRAVENOUS
  Administered 2016-04-18: 350 ug/h via INTRAVENOUS
  Filled 2016-04-13 (×10): qty 50

## 2016-04-13 MED ORDER — MIDAZOLAM HCL 2 MG/2ML IJ SOLN
INTRAMUSCULAR | Status: AC
  Administered 2016-04-13: 2 mg
  Filled 2016-04-13: qty 4

## 2016-04-13 NOTE — Progress Notes (Signed)
[facility] PROGRESS NOTE  Heidi [name] [id number] DOB: 1986/09/20 DOA: 04/10/2016 PCP: No primary care provider on file.   Assessment/Plan: 1. Acute hypoxemic hyperplastic respiratory failure  -Heidi [name] is a 30 year old female with a history of respiratory failure requiring rapid sequence intubation on a hospitalization from 03/16/2016 through 03/23/2016 -She presents with multifocal pneumonia, started on broad-spectrum IV antibiotic therapy. -On the morning of 04/13/2016 patient going into acute hypoxemic hypercarbic respiratory failure, having mental status changes, ABG revealed pH of 7.13 with PCO2 unreadable -She was placed on NPPV  -Lung exam showing extensive rhonchi, crackles, appear to be in respiratory distress  -Pulmonary critical care medicine immediately consulted plan for rapid sequence intubation   2.  Possible postobstructive pneumonia versus hospital-acquired pneumonia -Heidi [name] is a 30 year old female recently diagnosed with Hodgkin's lymphoma, undergoing biopsy of right axillary lymph node on 03/19/2016 -She presented with worsening shortness of breath as chest x-ray showing worsening consolidation throughout majority of right lung. -Findings could be secondary to postobstructive pneumonia versus healthcare associated pneumonia given recent hospitalization.  -Portable chest x-ray performed 04/11/2016 showing interval worsening opacities in left upper lung and persistent near opacification of right hemithorax. Radios report and findings compatible with extensive multifocal pneumonia. -Continue broad-spectrum IV antimicrobial therapy with cefepime and vancomycin and close monitoring in the step down unit. -Blood cultures drawn 04/11/2016 showing no growth after 2 days of incubation  3.  Hodgkin's lymphoma. -Received CT imaging of chest revealed extensive lymphadenopathy throughout hilum mediastinum right axillary region for which she underwent right  axillary lymph node biopsy by interventional radiology on 03/16/2016. -Pathology reporting Hodgkin's lymphoma. -Plan had been for follow-up with medical oncology and outpatient staging PET/CT scan. She has not been started on systemic therapy.  Code Status: Full code  Family Communication:  her mother was notified  Disposition She underwent rapid sequence intubation on morning of 04/13/2016, care transferred to pulmonary critical care medicine   Antibiotics:  IV vancomycin  IV cefepime  HPI/Subjective: Heidi [name] is a 30 year old female with a past medical history of lymphoma who was recently hospitalized for pneumonia and acute respiratory distress syndrome from 03/17/2015 through 03/24/2015. She underwent rapid sequence intubation during that hospitalization on 03/17/2016. She also underwent surgical biopsy of right axillary lymph node on 03/19/2016 and bronchoscopy. Pathology revealed Hodgkin's lymphoma. She presented to the emergency department on 04/10/2016 with complaints of shortness of breath. A chest x-ray revealed interval worsening of consolidation throughout a majority of the right lung which could represent postobstructive pneumonia. She was started on broad-spectrum IV antimicrobial therapy with cefepime and vancomycin  Objective: Filed Vitals:   04/13/16 0640 04/13/16 0705  BP: 159/82   Pulse: 103 108  Temp:    Resp: 28 28    Intake/Output Summary (Last 24 hours) at 04/13/16 0738 Last data filed at 04/13/16 0500  Gross per 24 hour  Intake 2641.67 ml  Output   1155 ml  Net 1486.67 ml   Filed Weights   04/10/16 2300 04/11/16 0550  Weight: 62.4 kg (137 lb 9.1 oz) 61.9 kg (136 lb 7.4 oz)    Exam:   General:  She appears lethargic now on NPPV, having difficulties following commands  Cardiovascular: Regular rate and rhythm normal S1-S2   Respiratory: extensive bilateral rhonchi, crackles, in respiratory failure using accessory muscles    Abdomen: Soft  nontender nondistended   Musculoskeletal: No edema  Data Reviewed: Basic Metabolic Panel:  Recent Labs Lab 04/10/16 1930 04/11/16 [id number] 04/12/16 0316 04/13/16 [id number]  NA 128* 130* 135 133*  K 4.4 4.2 4.7 5.2*  CL 87* 96* 96* 91*  CO2 27 29 33* 36*  GLUCOSE 84 123* 122* 138*  BUN 8 5* <5* <5*  CREATININE 0.58 0.47 0.46 0.47  CALCIUM 8.2* 7.9* 8.0* 8.1*   Liver Function Tests:  Recent Labs Lab 04/10/16 1930  AST 16  ALT 15  ALKPHOS 107  BILITOT 0.1*  PROT 5.1*  ALBUMIN 2.2*   No results for input(s): LIPASE, AMYLASE in the last 168 hours. No results for input(s): AMMONIA in the last 168 hours. CBC:  Recent Labs Lab 04/10/16 1930 04/11/16 0333 04/12/16 0316 04/13/16 0306  WBC 9.5 9.5 10.2 10.3  NEUTROABS 8.0*  --   --   --   HGB 11.9* 12.2 11.9* 11.6*  HCT 35.0* 35.5* 37.9 37.5  MCV 87.1 89.2 94.8 95.9  PLT 565* 622* 590* 626*   Cardiac Enzymes: No results for input(s): CKTOTAL, CKMB, CKMBINDEX, TROPONINI in the last 168 hours. BNP (last 3 results) No results for input(s): BNP in the last 8760 hours.  ProBNP (last 3 results) No results for input(s): PROBNP in the last 8760 hours.  CBG: No results for input(s): GLUCAP in the last 168 hours.  Recent Results (from the past 240 hour(s))  MRSA PCR Screening     Status: Abnormal   Collection Time: 04/11/16 12:56 AM  Result Value Ref Range Status   MRSA by PCR POSITIVE (A) NEGATIVE Final    Comment:        The GeneXpert MRSA Assay (FDA approved for NASAL specimens only), is one component of a comprehensive MRSA colonization surveillance program. It is not intended to diagnose MRSA infection nor to guide or monitor treatment for MRSA infections. RESULT CALLED TO, READ BACK BY AND VERIFIED WITH: Lottie Dawson RN 0308 04/11/16 A NAVARRO   Culture, blood (Routine X 2) w Reflex to ID Panel     Status: None (Preliminary result)   Collection Time: 04/11/16  2:45 PM  Result Value Ref Range Status   Specimen  Description LEFT ANTECUBITAL  Final   Special Requests BOTTLES DRAWN AEROBIC AND ANAEROBIC 10CC  Final   Culture   Final    NO GROWTH < 24 HOURS Performed at [facility]    Report Status PENDING  Incomplete  Culture, blood (Routine X 2) w Reflex to ID Panel     Status: None (Preliminary result)   Collection Time: 04/11/16  2:52 PM  Result Value Ref Range Status   Specimen Description BLOOD LEFT ARM  Final   Special Requests BOTTLES DRAWN AEROBIC AND ANAEROBIC 5CC  Final   Culture   Final    NO GROWTH < 24 HOURS Performed at [facility]    Report Status PENDING  Incomplete     Studies: Dg Chest 1 View  04/11/2016  CLINICAL DATA:  Shortness of breath.  History of lymphoma. EXAM: CHEST 1 VIEW COMPARISON:  Chest radiograph 04/10/2016; chest CT 04/10/2016 FINDINGS: Grossly unchanged diffuse opacification of the right hemi thorax with worsening opacities involving the left mid and upper lung. No definite pleural effusion or pneumothorax. Regional skeleton is unremarkable. IMPRESSION: Interval worsening opacities left upper lung with persistent near complete opacification of the right hemi thorax, findings overall compatible with extensive multi focal pneumonia. Known mediastinal and hilar adenopathy. Electronically Signed   By: Lovey Newcomer M.D.   On: 04/11/2016 17:03    Scheduled Meds: . ceFEPime (MAXIPIME) IV  2 g Intravenous  3 times per day  . Chlorhexidine Gluconate Cloth  6 each Topical Q0600  . enoxaparin (LOVENOX) injection  40 mg Subcutaneous QHS  . fentaNYL      . labetalol  100 mg Oral BID  . midazolam      . mupirocin ointment  1 application Nasal BID  . vancomycin  1,000 mg Intravenous Q8H   Continuous Infusions: . sodium chloride 125 mL/hr (04/12/16 1840)    Principal Problem:   HCAP (healthcare-associated pneumonia) Active Problems:   Acute respiratory failure ([place])   Lymphoma ([place])   Hyponatremia   Thrombocytosis ([place])    Time spent: 35  minutes   Kelvin Cellar  [facility] Pager [phone]. If 7PM-7AM, please contact night-coverage at [email or web address], password [facility] 04/13/2016, 7:38 AM  LOS: 3 days

## 2016-04-13 NOTE — Progress Notes (Signed)
Patient with oxygen off, SpO2 at 45 on room air. Patient is confused, remains labored, tugging and short of breath.  Reapplied oxygen at 8 liters, patient slowly recovering SpO2 at 94 . Breath sounds remain coarse and now demised notified RT for breathing treatment.

## 2016-04-13 NOTE — Procedures (Signed)
Intubation Procedure Note Heidi [name] [id number] 10/17/1986  Procedure: Intubation Indications: Respiratory insufficiency  Procedure Details Consent: Unable to obtain consent because of emergent medical necessity. Time Out: Verified patient identification, verified procedure, site/side was marked, verified correct patient position, special equipment/implants available, medications/allergies/relevent history reviewed, required imaging and test results available.  Performed  Maximum sterile technique was used including gloves, gown, hand hygiene and mask.  MAC and 3  Given 2 mg versed, 100 mcg fentanyl, 20 mg etomidate.  Inserted #7.5 ETT to 22 cm at lip.  Confirmed with CO2 detector and ausculation.  Evaluation Hemodynamic Status: Transient hypotension treated with fluid; O2 sats: transiently fell during during procedure Patient's Current Condition: stable Complications: No apparent complications Patient did tolerate procedure well. Chest X-ray ordered to verify placement.  CXR: pending.   Chesley Mires, MD [facility] Pulmonary/Critical Care 04/13/2016, 8:22 AM Pager:  [phone] After 3pm call: [phone]

## 2016-04-13 NOTE — Progress Notes (Signed)
Juliane Lack NP saw chest xray post Placement of central line and confirms that line can be used for IV fluids.

## 2016-04-13 NOTE — Progress Notes (Signed)
Initial Nutrition Assessment  INTERVENTION:   Initiate Vital HP @ 20 ml/hr via OGT and increase by 10 ml every 4 hours to goal rate of 45 ml/hr.  Tube feeding regimen + current Propofol infusion provides 1571 kcal (96% of needs), 95 grams of protein, and 903 ml of H2O.   RD to continue to monitor  NUTRITION DIAGNOSIS:   Inadequate oral intake related to inability to eat as evidenced by NPO status.  GOAL:   Patient will meet greater than or equal to 90% of their needs  MONITOR:   Vent status, Labs, Weight trends, TF tolerance, I & O's  REASON FOR ASSESSMENT:   Consult, Ventilator Enteral/tube feeding initiation and management  ASSESSMENT:   30 y.o. female with a recent diagnosis of Lymphoma and hospitalization for Pneumonia and ARDS on 03/21-03/28 who presents to the ED with complaints of worsening SOB along with cough and Fevers and chills for the past 2 weeks.  Patient in room sedated with no family present. Per chart review, pt was consuming ~75% of meals prior to intubation. Pt's weight has remained stable since last admission in March.   Patient is currently intubated on ventilator support MV: 8.5 L/min Temp (24hrs), Avg:98.8 F (37.1 C), Min:98.2 F (36.8 C), Max:99.6 F (37.6 C)  Propofol: 18.6 ml/hr- provides 491 fat kcal  Medications reviewed. Labs reviewed: Low Na Elevated K  Diet Order:  Diet NPO time specified  Skin:  Wound (see comment) (closed arm incision)  Last BM:  4/17  Height:   Ht Readings from Last 1 Encounters:  04/13/16 5\' 6"  (1.676 m)    Weight:   Wt Readings from Last 1 Encounters:  04/11/16 136 lb 7.4 oz (61.9 kg)    Ideal Body Weight:  59.09 kg  BMI:  Body mass index is 22.04 kg/(m^2).  Estimated Nutritional Needs:   Kcal:  [id number]  Protein:  90-100g  Fluid:  1.7L/day  EDUCATION NEEDS:   No education needs identified at this time  Clayton Bibles, MS, RD, LDN Pager: [phone] After Hours Pager: [phone]

## 2016-04-13 NOTE — Procedures (Signed)
Central Venous Catheter Insertion Procedure Note GRACEN BEDIENT [id number] [birth year]  Procedure: Insertion of Central Venous Catheter Indications: Assessment of intravascular volume, Drug and/or fluid administration and Frequent blood sampling  Procedure Details Consent: Risks of procedure as well as the alternatives and risks of each were explained to the (patient/caregiver).  Consent for procedure obtained.   Time Out: Verified patient identification, verified procedure, site/side was marked, verified correct patient position, special equipment/implants available, medications/allergies/relevent history reviewed, required imaging and test results available.  Performed  Maximum sterile technique was used including antiseptics, cap, gloves, gown, hand hygiene, mask and sheet. Skin prep: Chlorhexidine; local anesthetic administered  A antimicrobial bonded/coated triple lumen catheter was placed in the left internal jugular vein to 17 cm using the Seldinger technique.  Biopatch applied.  Sutured in place.    Evaluation Blood flow good Complications: No apparent complications Patient did tolerate procedure well. Chest X-ray ordered to verify placement.  CXR: pending.     Procedure performed under direct supervision of Dr. Halford Chessman and with ultrasound guidance for real time vessel cannulation.      Noe Gens, NP-C [facility] Pgr: [phone] or if no answer [phone] 04/13/2016, 10:49 AM

## 2016-04-13 NOTE — Progress Notes (Signed)
[id number] Davis,BSN,RN,CCM,RN3 [phone]: Chart reviewed for utilization review and patient needs. Will follow for any discharge and inpatient needs Patient currently sedated and remains on full vent support.

## 2016-04-13 NOTE — Progress Notes (Signed)
Dr. Lubertha South at bedside. Patient remains on BI-Pap, notified charge nurse Pam RN, and Becky RN that Dr. Lubertha South notified PCCM for intubation.

## 2016-04-13 NOTE — Progress Notes (Signed)
Notified Mother Evalyn Casco [phone] that patient needed to be intubated.

## 2016-04-13 NOTE — Consult Note (Signed)
PULMONARY / CRITICAL CARE MEDICINE   Name: Heidi [name] MRN: [id number] DOB: 1986-04-07    ADMISSION DATE:  04/10/2016 CONSULTATION DATE:  04/13/2016  REFERRING MD:  [facility]  CHIEF COMPLAINT:  Short of breath  HISTORY OF PRESENT ILLNESS:   Hx from chart.  30 yo female was admitted in March 2017 with respiratory failure from pneumonia and ARDS.  She was found to have diffuse adenopathy >> Rt axillary bx showed Hodgkin's lymphoma.  She was set up for outpt follow up with oncology.  On 4/15 she presented with worsening dyspnea.  CXR and CT chest showed progressive Rt >> Lt ASD.  She was started on Abx for HCAP and supplemental oxygen.  On 4/18 she developed worsening mental status.  This was associated with progressive hypoxia and hypercapnia with respiratory acidosis.  She was tried on BiPAP w/o improvement and required intubation.   PAST MEDICAL HISTORY :  She  has a past medical history of Asthma; Hodgkin lymphoma ([place]); and Hypertension.  PAST SURGICAL HISTORY: She  has past surgical history that includes Axillary lymph node biopsy (Right, 03/19/2016).  No Known Allergies  No current facility-administered medications on file prior to encounter.   Current Outpatient Prescriptions on File Prior to Encounter  Medication Sig  . guaiFENesin (MUCINEX) 600 MG 12 hr tablet Take 1,200 mg by mouth 2 (two) times daily as needed for cough or to loosen phlegm.  . labetalol (NORMODYNE) 100 MG tablet Take 1 tablet (100 mg total) by mouth 2 (two) times daily.  [name] Kitchen levalbuterol (XOPENEX HFA) 45 MCG/ACT inhaler Inhale 2 puffs into the lungs every 6 (six) hours as needed for wheezing.  [name] Kitchen amoxicillin-clavulanate (AUGMENTIN) 875-125 MG tablet Take 1 tablet by mouth every 12 (twelve) hours. (Patient not taking: Reported on 04/10/2016)    FAMILY HISTORY:  Her family history is negative for lymphoma.  SOCIAL HISTORY: She  reports that she has been smoking Cigarettes.  She has been smoking about  0.50 packs per day. She does not have any smokeless tobacco history on file. She reports that she drinks alcohol.  REVIEW OF SYSTEMS:   Unable to obtain.  SUBJECTIVE:    VITAL SIGNS: BP 159/82 mmHg  Pulse 110  Temp(Src) 98.9 F (37.2 C) (Oral)  Resp 22  Ht 5\' 6"  (1.676 m)  Wt 136 lb 7.4 oz (61.9 kg)  BMI 22.04 kg/m2  SpO2 92%  LMP 01/11/2016 (Approximate)  HEMODYNAMICS:    VENTILATOR SETTINGS: Vent Mode:  [-] PRVC FiO2 (%):  [50 %-60 %] 60 % Set Rate:  [16 bmp] 16 bmp Vt Set:  [480 mL] 480 mL PEEP:  [5 cmH20] 5 cmH20  INTAKE / OUTPUT: I/O last 3 completed shifts: In: 3331.7 [P.O.:1370; I.V.:1361.7; IV Piggyback:600] Out: 1955 [id number]  PHYSICAL EXAMINATION: General:  Ill appearing Neuro:  Moves extremities HEENT:  Pupils reactive Cardiovascular:  Regular, tachycardic Lungs:  Coarse crackles upper lobes b/l, decreased BS Rt lower lung Abdomen:  Soft, non tender Musculoskeletal:  No edema Skin:  No rashes  LABS:  BMET  Recent Labs Lab 04/11/16 0333 04/12/16 0316 04/13/16 0306  NA 130* 135 133*  K 4.2 4.7 5.2*  CL 96* 96* 91*  CO2 29 33* 36*  BUN 5* <5* <5*  CREATININE 0.47 0.46 0.47  GLUCOSE 123* 122* 138*    Electrolytes  Recent Labs Lab 04/11/16 0333 04/12/16 0316 04/13/16 0306  CALCIUM 7.9* 8.0* 8.1*    CBC  Recent Labs Lab 04/11/16 0333 04/12/16 0316 04/13/16 0306  WBC 9.5 10.2 10.3  HGB 12.2 11.9* 11.6*  HCT 35.5* 37.9 37.5  PLT 622* 590* 626*    Coag's No results for input(s): APTT, INR in the last 168 hours.  Sepsis Markers  Recent Labs Lab 04/10/16 1953  LATICACIDVEN 1.50    ABG  Recent Labs Lab 04/10/16 1932 04/11/16 1100 04/13/16 0645  PHART 7.404 7.381 7.130*  PCO2ART 46.7* 49.7* VALUE ABOVE REPORTABLE RANGE  PO2ART 87.3 38.2* 64.6*    Liver Enzymes  Recent Labs Lab 04/10/16 1930  AST 16  ALT 15  ALKPHOS 107  BILITOT 0.1*  ALBUMIN 2.2*    Cardiac Enzymes No results for input(s):  TROPONINI, PROBNP in the last 168 hours.  Glucose No results for input(s): GLUCAP in the last 168 hours.  Imaging No results found.   STUDIES:  4/15 CT angio chest >> near complete consolidation Rt lung and central Lt lung, mediastinal LAN  CULTURES: 4/16 Blood >> 4/18 Sputum >>  ANTIBIOTICS: 4/15 Vancomycin >> 4/15 Cefepime >>   SIGNIFICANT EVENTS: 3/21-3/28 Admit for PNA, ARDS and had Rt axillary LN bx >> Hodgkin's lymphoma; seen by oncology 4/15 Admit 4/18 VDRF due to hypoxia/hypercapnia  LINES/TUBES: 4/18 ETT >>  DISCUSSION: 30 yo female smoker was was in hospital from 3/21 to 3/28 with PNA and dx with Hodgkin's lymphoma.  She presented 4/15 with progressive dyspnea for worsening pneumonia.  She had worsening hypoxia/hypercapnia, and required intubation.  She has hx of asthma, HTN..  ASSESSMENT / PLAN:  PULMONARY A: Acute hypoxic/hypercapnic respiratory failure 2nd to HCAP. Tobacco abuse. Hx of asthma. P:   Full vent support F/u CXR, ABG Scheduled BDs  CARDIOVASCULAR A:  Hypotension after intubation. Hx of HTN. P:  Continue IV fluids Check lactic acid Hold labetalol  RENAL A:   Hyperkalemia >> likely from acidosis. P:   F/u ABG, BMET  GASTROINTESTINAL A:   Protein calorie malnutrition. P:   Tube feeds while on vent Protonix for SUP  HEMATOLOGIC A:   Recent dx of Hodgkin's lymphoma. P:  Will need f/u with oncology when pneumonia resolved  INFECTIOUS A:   HCAP. P:   Day 4 of vancomycin, cefepime Check procalcitonin  ENDOCRINE A:   Mild hyperglycemia. P:   SSI while on tube feeds  NEUROLOGIC A:   Acute metabolic encephalopathy. P:   RASS goal: -1 to -2  Spoke with pt's mother over the phone.  CC time independent of procedure time is 40 minutes.   Chesley Mires, MD [facility] Pulmonary/Critical Care 04/13/2016, 8:17 AM Pager:  [phone] After 3pm call: [phone]

## 2016-04-13 NOTE — Progress Notes (Signed)
CRITICAL VALUE ALERT  Critical value received:  ABG, CO2  Date of notification:  04/13/16  Time of notification:  0700  Critical value read back yes  Nurse who received alert:  Pamala Hurry   MD notified (1st page):  Dr.Zoman   Time of first page:  0705  MD notified (2nd page):  Time of second page:  Responding MD:  Dr. Philemon Kingdom,   Time MD responded:  [phone]

## 2016-04-14 ENCOUNTER — Inpatient Hospital Stay (HOSPITAL_COMMUNITY): Payer: MEDICAID

## 2016-04-14 ENCOUNTER — Inpatient Hospital Stay (HOSPITAL_COMMUNITY): Payer: Self-pay

## 2016-04-14 ENCOUNTER — Inpatient Hospital Stay: Payer: Self-pay | Admitting: Hematology

## 2016-04-14 DIAGNOSIS — C8177 Other classical Hodgkin lymphoma, spleen: Secondary | ICD-10-CM

## 2016-04-14 DIAGNOSIS — J9691 Respiratory failure, unspecified with hypoxia: Secondary | ICD-10-CM

## 2016-04-14 DIAGNOSIS — C8128 Mixed cellularity classical Hodgkin lymphoma, lymph nodes of multiple sites: Secondary | ICD-10-CM | POA: Diagnosis present

## 2016-04-14 LAB — CBC WITH DIFFERENTIAL/PLATELET
BASOS ABS: 0 10*3/uL (ref 0.0–0.1)
BASOS PCT: 0 %
Eosinophils Absolute: 0.1 10*3/uL (ref 0.0–0.7)
Eosinophils Relative: 1 %
HEMATOCRIT: 34.6 % — AB (ref 36.0–46.0)
Hemoglobin: 11 g/dL — ABNORMAL LOW (ref 12.0–15.0)
Lymphocytes Relative: 5 %
Lymphs Abs: 0.5 10*3/uL — ABNORMAL LOW (ref 0.7–4.0)
MCH: 29.6 pg (ref 26.0–34.0)
MCHC: 31.8 g/dL (ref 30.0–36.0)
MCV: 93 fL (ref 78.0–100.0)
Monocytes Absolute: 0.9 10*3/uL (ref 0.1–1.0)
Monocytes Relative: 9 %
NEUTROS ABS: 8.5 10*3/uL — AB (ref 1.7–7.7)
Neutrophils Relative %: 85 %
PLATELETS: 518 10*3/uL — AB (ref 150–400)
RBC: 3.72 MIL/uL — ABNORMAL LOW (ref 3.87–5.11)
RDW: 14.4 % (ref 11.5–15.5)
WBC: 10 10*3/uL (ref 4.0–10.5)

## 2016-04-14 LAB — BLOOD GAS, ARTERIAL
ACID-BASE EXCESS: 7.6 mmol/L — AB (ref 0.0–2.0)
Bicarbonate: 33.4 mEq/L — ABNORMAL HIGH (ref 20.0–24.0)
Drawn by: 308601
FIO2: 0.5
MECHVT: 480 mL
O2 Saturation: 89.9 %
PEEP: 5 cmH2O
PO2 ART: 61 mmHg — AB (ref 80.0–100.0)
Patient temperature: 98.6
RATE: 18 resp/min
TCO2: 31 mmol/L (ref 0–100)
pCO2 arterial: 56.1 mmHg — ABNORMAL HIGH (ref 35.0–45.0)
pH, Arterial: 7.392 (ref 7.350–7.450)

## 2016-04-14 LAB — PROCALCITONIN: Procalcitonin: 0.16 ng/mL

## 2016-04-14 LAB — GLUCOSE, CAPILLARY
GLUCOSE-CAPILLARY: 107 mg/dL — AB (ref 65–99)
GLUCOSE-CAPILLARY: 119 mg/dL — AB (ref 65–99)
GLUCOSE-CAPILLARY: 63 mg/dL — AB (ref 65–99)
GLUCOSE-CAPILLARY: 66 mg/dL (ref 65–99)
GLUCOSE-CAPILLARY: 86 mg/dL (ref 65–99)
Glucose-Capillary: 69 mg/dL (ref 65–99)
Glucose-Capillary: 84 mg/dL (ref 65–99)
Glucose-Capillary: 84 mg/dL (ref 65–99)
Glucose-Capillary: 121 mg/dL — ABNORMAL HIGH (ref 65–99)
Glucose-Capillary: 115 mg/dL — ABNORMAL HIGH (ref 65–99)

## 2016-04-14 LAB — COMPREHENSIVE METABOLIC PANEL
ALBUMIN: 1.5 g/dL — AB (ref 3.5–5.0)
ALK PHOS: 65 U/L (ref 38–126)
ALT: 8 U/L — AB (ref 14–54)
AST: 10 U/L — AB (ref 15–41)
Anion gap: 3 — ABNORMAL LOW (ref 5–15)
BUN: 6 mg/dL (ref 6–20)
CALCIUM: 7.7 mg/dL — AB (ref 8.9–10.3)
CHLORIDE: 100 mmol/L — AB (ref 101–111)
CO2: 33 mmol/L — AB (ref 22–32)
CREATININE: 0.5 mg/dL (ref 0.44–1.00)
GFR calc Af Amer: >60 mL/min (ref >60)
GFR calc non Af Amer: >60 mL/min (ref >60)
GLUCOSE: 115 mg/dL — AB (ref 65–99)
Potassium: 4.5 mmol/L (ref 3.5–5.1)
SODIUM: 136 mmol/L (ref 135–145)
Total Bilirubin: 0.1 mg/dL — ABNORMAL LOW (ref 0.3–1.2)
Total Protein: 3.8 g/dL — ABNORMAL LOW (ref 6.5–8.1)

## 2016-04-14 LAB — CBC
HCT: 32.4 % — ABNORMAL LOW (ref 36.0–46.0)
Hemoglobin: 10.2 g/dL — ABNORMAL LOW (ref 12.0–15.0)
MCH: 29.8 pg (ref 26.0–34.0)
MCHC: 31.5 g/dL (ref 30.0–36.0)
MCV: 94.7 fL (ref 78.0–100.0)
PLATELETS: 486 10*3/uL — AB (ref 150–400)
RBC: 3.42 MIL/uL — ABNORMAL LOW (ref 3.87–5.11)
RDW: 14.5 % (ref 11.5–15.5)
WBC: 9.1 10*3/uL (ref 4.0–10.5)

## 2016-04-14 LAB — VANCOMYCIN, TROUGH: VANCOMYCIN TR: 12 ug/mL (ref 10.0–20.0)

## 2016-04-14 LAB — MAGNESIUM: MAGNESIUM: 1.7 mg/dL (ref 1.7–2.4)

## 2016-04-14 LAB — LACTATE DEHYDROGENASE: LDH: 322 U/L — AB (ref 98–192)

## 2016-04-14 LAB — PHOSPHORUS: Phosphorus: 2 mg/dL — ABNORMAL LOW (ref 2.5–4.6)

## 2016-04-14 LAB — SEDIMENTATION RATE: Sed Rate: 0 mm/hr (ref 0–22)

## 2016-04-14 MED ORDER — DEXTROSE-NACL 5-0.9 % IV SOLN
INTRAVENOUS | Status: DC
  Administered 2016-04-14 – 2016-04-18 (×4): via INTRAVENOUS

## 2016-04-14 MED ORDER — DEXTROSE 50 % IV SOLN
INTRAVENOUS | Status: AC
  Administered 2016-04-14: 50 mL
  Filled 2016-04-14: qty 50

## 2016-04-14 MED ORDER — SODIUM CHLORIDE 0.9 % IV BOLUS (SEPSIS)
500.0000 mL | Freq: Once | INTRAVENOUS | Status: AC
  Administered 2016-04-14: 500 mL via INTRAVENOUS

## 2016-04-14 MED ORDER — VANCOMYCIN HCL 10 G IV SOLR
1250.0000 mg | Freq: Once | INTRAVENOUS | Status: AC
  Administered 2016-04-14: 1250 mg via INTRAVENOUS
  Filled 2016-04-14: qty 1250

## 2016-04-14 MED ORDER — SODIUM CHLORIDE 0.9 % IV SOLN
250.0000 mg | Freq: Four times a day (QID) | INTRAVENOUS | Status: DC
  Administered 2016-04-14 – 2016-04-15 (×3): 250 mg via INTRAVENOUS
  Filled 2016-04-14: qty 4
  Filled 2016-04-14 (×3): qty 2

## 2016-04-14 MED ORDER — VANCOMYCIN HCL 10 G IV SOLR
1250.0000 mg | Freq: Three times a day (TID) | INTRAVENOUS | Status: DC
  Administered 2016-04-15 – 2016-04-19 (×13): 1250 mg via INTRAVENOUS
  Filled 2016-04-14 (×14): qty 1250

## 2016-04-14 NOTE — Progress Notes (Signed)
CRITICAL VALUE ALERT  Critical value received:  CBG 57, 25 mL of D50 given, 15 minute CBG 86.  Date of notification:  04/13/16  Time of notification:  2345  Critical value read back:Yes.    Nurse who received alert:  Angie  MD notified (1st page):  Elink MD  Time of first page:  0045  MD notified (2nd page):  Time of second page:  Responding MD:  Warren Lacy MD  Time MD responded:  [phone]

## 2016-04-14 NOTE — Progress Notes (Signed)
Inpatient Diabetes Program Recommendations  AACE/ADA: New Consensus Statement on Inpatient Glycemic Control (2015)  Target Ranges:  Prepandial:   less than 140 mg/dL      Peak postprandial:   less than 180 mg/dL (1-2 hours)      Critically ill patients:  140 - 180 mg/dL   Review of Glycemic Control  Diabetes history: None Outpatient Diabetes medications: None Current orders for Inpatient glycemic control: Novolog moderate Q4H  Hypoglycemia x 2. Decrease Novolog.  Inpatient Diabetes Program Recommendations:    Decrease Novolog to sensitive Q4H since pt is insulin naive and no hx DM.  Will continue to follow. Thank you. Lorenda Peck, RD, LDN, CDE Inpatient Diabetes Coordinator [phone]

## 2016-04-14 NOTE — Consult Note (Signed)
PULMONARY / CRITICAL CARE MEDICINE   Name: Heidi [name] MRN: [id number] DOB: 02-05-1986    ADMISSION DATE:  04/10/2016 CONSULTATION DATE:  04/13/2016  REFERRING MD:  [facility]  CHIEF COMPLAINT:  Short of breath  SUBJECTIVE:  Remains on full vent support.  VITAL SIGNS: BP 106/52 mmHg  Pulse 103  Temp(Src) 97.2 F (36.2 C) (Oral)  Resp 18  Ht 5\' 6"  (1.676 m)  Wt 136 lb 7.4 oz (61.9 kg)  BMI 22.04 kg/m2  SpO2 97%  LMP 01/11/2016 (Approximate)  VENTILATOR SETTINGS: Vent Mode:  [-] PRVC FiO2 (%):  [50 %-100 %] 50 % Set Rate:  [18 bmp] 18 bmp Vt Set:  [480 mL] 480 mL PEEP:  [5 cmH20] 5 cmH20 Plateau Pressure:  [26 cmH20-30 cmH20] 30 cmH20  INTAKE / OUTPUT: I/O last 3 completed shifts: In: 6611.8 [P.O.:350; I.V.:4720.8; NG/GT:541; IV Piggyback:1000] Out: 3005 [Urine:2885; Emesis/NG output:120]  PHYSICAL EXAMINATION: General: sedated Neuro: RASS -2 HEENT:  ETT in place Cardiovascular:  Regular, tachycardic Lungs:  Coarse crackles upper lobes b/l, decreased BS Rt lower lung Abdomen:  Soft, non tender Musculoskeletal:  No edema Skin:  No rashes  LABS:  BMET  Recent Labs Lab 04/12/16 0316 04/13/16 0306 04/14/16 0600  NA 135 133* 136  K 4.7 5.2* 4.5  CL 96* 91* 100*  CO2 33* 36* 33*  BUN <5* <5* 6  CREATININE 0.46 0.47 0.50  GLUCOSE 122* 138* 115*    Electrolytes  Recent Labs Lab 04/12/16 0316 04/13/16 0306 04/14/16 0600  CALCIUM 8.0* 8.1* 7.7*  MG  --   --  1.7  PHOS  --   --  2.0*    CBC  Recent Labs Lab 04/12/16 0316 04/13/16 0306 04/14/16 0600  WBC 10.2 10.3 9.1  HGB 11.9* 11.6* 10.2*  HCT 37.9 37.5 32.4*  PLT 590* 626* 486*    Coag's No results for input(s): APTT, INR in the last 168 hours.  Sepsis Markers  Recent Labs Lab 04/10/16 1953 04/13/16 0840 04/14/16 0600  LATICACIDVEN 1.50 1.3  --   PROCALCITON  --  0.17 0.16    ABG  Recent Labs Lab 04/13/16 0645 04/13/16 0900 04/14/16 0310  PHART 7.130* 7.238* 7.392   PCO2ART VALUE ABOVE REPORTABLE RANGE 79.1* 56.1*  PO2ART 64.6* 84.3 61.0*    Liver Enzymes  Recent Labs Lab 04/10/16 1930 04/14/16 0600  AST 16 10*  ALT 15 8*  ALKPHOS 107 65  BILITOT 0.1* 0.1*  ALBUMIN 2.2* 1.5*    Cardiac Enzymes No results for input(s): TROPONINI, PROBNP in the last 168 hours.  Glucose  Recent Labs Lab 04/13/16 1244 04/13/16 2342 04/14/16 0009 04/14/16 0352 04/14/16 0425  GLUCAP 82 57* 86 66 121*    Imaging Dg Chest Port 1 View  04/14/2016  CLINICAL DATA:  Endotracheal tube position, Hodgkin lymphoma. EXAM: PORTABLE CHEST 1 VIEW COMPARISON:  04/13/2016 and CT chest 04/10/2016. FINDINGS: Endotracheal tube terminates approximately 6.5 cm above carina, stable. Nasogastric tube is followed into the stomach. Left IJ central line tip projects at the junction of the brachiocephalic veins. Near complete opacification of the right hemithorax with leftward shift of the heart and mediastinum, as before. Patchy airspace consolidation bilaterally. Probable small left pleural effusion. IMPRESSION: 1. Dense consolidation throughout the right lung with patchy areas of consolidation in the left lung, likely due to pneumonia. 2. Possible small left pleural effusion. Electronically Signed   By: Lorin Picket M.D.   On: 04/14/2016 07:07   Dg Chest [facility]  04/13/2016  CLINICAL DATA:  Status post central catheter placement. Hodgkin's lymphoma. EXAM: PORTABLE CHEST 1 VIEW COMPARISON:  Study obtained earlier in the day as well as chest CT April 10, 2016 FINDINGS: Central catheter tip is in the superior vena cava near the junction with the left innominate vein. Endotracheal tube tip is 4.4 cm above the carina. Nasogastric tube tip and side port are below the diaphragm. No pneumothorax. There is extensive consolidation with probable superimposed effusion, causing opacification of most of the right hemithorax, stable. There are areas of consolidation in the left mid lung with  slightly less overall opacity compared to earlier in the day. The heart size and pulmonary vascularity are normal. It is difficult to assess for adenopathy given the overlying areas of consolidation. No bone lesions are evident. IMPRESSION: Tube and catheter positions as described without pneumothorax. Widespread pneumonia stable on the right with slightly less overall opacity on the left compared to earlier in the day. Confluent opacity is present in the left mid lung focally. Stable cardiac silhouette. Electronically Signed   By: Lowella Grip III M.D.   On: 04/13/2016 11:20   Dg Chest Port 1 View  04/13/2016  CLINICAL DATA:  Endotracheal orogastric tube placement EXAM: PORTABLE CHEST 1 VIEW COMPARISON:  Two days ago FINDINGS: Endotracheal tube tip just below the clavicular heads. An orogastric tube reaches the stomach. Bilateral pneumonia as demonstrated on recent chest CT, nearly confluent throughout the right lung. Air bronchograms are intermittently seen. No evidence of air leak or cavitation. No visible significant pleural effusion. Normal heart size. IMPRESSION: 1. Unremarkable positioning new endotracheal or orogastric tubes. 2.  Bilateral pneumonia without progression since 2 days ago. Electronically Signed   By: Monte Fantasia M.D.   On: 04/13/2016 08:23     STUDIES:  4/15 CT angio chest >> near complete consolidation Rt lung and central Lt lung, mediastinal LAN  CULTURES: 4/16 Blood >> 4/18 Sputum >>  ANTIBIOTICS: 4/15 Vancomycin >> 4/15 Cefepime >>   SIGNIFICANT EVENTS: 3/21-3/28 Admit for PNA, ARDS and had Rt axillary LN bx >> Hodgkin's lymphoma; seen by oncology 4/15 Admit 4/18 VDRF due to hypoxia/hypercapnia  LINES/TUBES: 4/18 ETT >> 4/18 Lt IJ CVL >>   DISCUSSION: 30 yo female smoker was was in hospital from 3/21 to 3/28 with PNA and dx with Hodgkin's lymphoma.  She presented 4/15 with progressive dyspnea for worsening pneumonia.  She had worsening  hypoxia/hypercapnia, and required intubation.  She has hx of asthma, HTN..  ASSESSMENT / PLAN:  PULMONARY A: Acute hypoxic/hypercapnic respiratory failure 2nd to progressive pulmonary infiltrates >> ?HCAP versus progression of lymphoma. Tobacco abuse. Hx of asthma. P:   Full vent support F/u CXR Scheduled BDs Might need bronchoscopy to get tissue sampling from right lung to determine if ASD is from infection versus lymphoma  CARDIOVASCULAR A:  Hypotension after intubation >> resolved.[name] Kitchen Hx of HTN. P:  Change to 30 ml/hr Hold labetalol  RENAL A:   Hyperkalemia >> likely from acidosis >> resolved. P:   F/u BMET  GASTROINTESTINAL A:   Protein calorie malnutrition. P:   Tube feeds while on vent Protonix for SUP  HEMATOLOGIC A:   Recent dx of Hodgkin's lymphoma. P:  Will ask oncology to assess for inpatient tx   INFECTIOUS A:   ?HCAP >> procalcitonin not significantly elevated. P:   Day 5 of vancomycin, cefepime  ENDOCRINE A:   Episodes of hypoglycemia. P:   Add dextrose to IV fluid F/u CBG's  NEUROLOGIC A:  Acute metabolic encephalopathy. P:   RASS goal: -1 to -2  SUMMARY: She has progressive Rt > Lt ASD.  She has been on several courses of broad spectrum antibiotics w/o improvement, and currently does not have significant elevation in procalcitonin.  She has not had sampling of right lung yet.  My concern is that her ASD is related more to progression of her lymphoma rather than infection.  Will d/w her mother about doing repeat bronchoscopy to sample right lung.  Will also ask oncology to assess initiating tx for lymphoma while she is in hospital.  CC time 32 minutes.  Chesley Mires, MD [facility] Pulmonary/Critical Care 04/14/2016, 8:09 AM Pager:  [phone] After 3pm call: [phone]

## 2016-04-14 NOTE — Progress Notes (Signed)
[name] Kitchen   HEMATOLOGY/ONCOLOGY INPATIENT PROGRESS NOTE  Date of Service: 04/14/2016  Inpatient Attending: .Chesley Mires, MD  SUBJECTIVE  Patient is a 30 year old African-American female with history of mild asthma who initially presented on 03/16/2016 with increasing shortness of breath for 2 weeks. She had initially used over-the-counter medication including Mucinex without relief and eventually was admitted with bilateral pneumonias. CT scan of the chest showed extensive lymphadenopathy throughout the bilateral hila, mediastinum, right axillary region as well as visualized upper abdominal/retroperitoneal region. She right axillary lymph node biopsy by interventional radiology on 03/16/2016 which was concerning for Hodgkin's lymphoma but inadequate for definitive diagnosis. She subsequently was intubated on 03/17/2016 for ARDS and had a bronchoscopy which showed negative cultures. Patient subsequently had an excisional biopsy of a right axillary lymph node by surgery - the final read of this pathology was pending on discharge. The patient had improved with IV antibiotics and was discharged on oral antibiotics on 3/28/2017with a plan to follow-up with [place] in clinic in one to 2 weeks after clearance of pneumonia for follow-up of the final pathology and consideration of PET/CT scan. She was noted to be HIV-negative. Echocardiogram showed normal ejection fraction. Hepatitis C negative. Hepatitis B serology negative. 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 Unfortunately the patient got readmitted to the hospital on 04/10/2016 prior to her clinic visit with increasing shortness of breath with repeat CTA of the chest showing near complete consolidation of the right lung and central consolidation involving left upper lobe and scattered nodular opacities throughout the left lung. Patient has been started on broad-spectrum antibiotics with cefepime and vancomycin for  possible bilateral pneumonias and had a bronchoscopy the results of which are pending. I discussed the case with Dr. Halford Chessman today who isn't certain that this is a pneumonia that it might reflect pulmonary involvement with Hodgkin's lymphoma. When evaluated in the hospital today the patient is now receiving is currently intubated.  OBJECTIVE:    PHYSICAL EXAMINATION: . Filed Vitals:   04/14/16 1100 04/14/16 1158 04/14/16 1200 04/14/16 1600  BP:  125/67 107/56   Pulse: 143 128 128   Temp:    98.8 F (37.1 C)  TempSrc:    Axillary  Resp: 13 18 18    Height:      Weight:      SpO2: 97% 98% 99%    Filed Weights   04/10/16 2300 04/11/16 0550  Weight: 137 lb 9.1 oz (62.4 kg) 136 lb 7.4 oz (61.9 kg)   .Body mass index is 22.04 kg/(m^2).   [name] Kitchen Wt Readings from Last 3 Encounters:  04/11/16 136 lb 7.4 oz (61.9 kg)  03/23/16 128 lb 8 oz (58.287 kg)  05/15/14 153 lb 11.2 oz (69.718 kg)     GENERAL:Sedated and intubated underwent SKIN: No acute rashes OROPHARYNX: NG tube and endotracheal tube in situ NECK: supple, no JVD LYMPH:  Small cervical and radicular lymphadenopathy  LUNGS: Bilateral coarse breath sounds  HEART: regular rate & rhythm, tachycardic ABDOMEN: abdomen mildly distended, normoactive bowel sounds NEURO: Sedated and intubated, unable to perform detailed neurological evaluation.  MEDICAL HISTORY:  Past Medical History  Diagnosis Date  . Asthma   . Hodgkin lymphoma ([place])   . Hypertension     SURGICAL HISTORY: Past Surgical History  Procedure Laterality Date  . Axillary lymph node biopsy Right 03/19/2016    Procedure: AXILLARY LYMPH NODE BIOPSY;  Surgeon: Armandina Gemma, MD;  Location: [facility];  Service: General;  Laterality: Right;  SOCIAL HISTORY: Social History   Social History  . Marital Status: Single    Spouse Name: N/A  . Number of Children: N/A  . Years of Education: N/A   Occupational History  . Not on file.   Social History Main Topics  .  Smoking status: Current Every Day Smoker -- 0.50 packs/day    Types: Cigarettes  . Smokeless tobacco: Not on file  . Alcohol Use: Yes     Comment: PT drinks 2 40 oz beer per day   . Drug Use: Not on file  . Sexual Activity: Not on file   Other Topics Concern  . Not on file   Social History Narrative    FAMILY HISTORY: No family history on file.  ALLERGIES:  has No Known Allergies.  MEDICATIONS:  Scheduled Meds: . antiseptic oral rinse  7 mL Mouth Rinse 10 times per day  . ceFEPime (MAXIPIME) IV  2 g Intravenous 3 times per day  . chlorhexidine gluconate (SAGE KIT)  15 mL Mouth Rinse BID  . Chlorhexidine Gluconate Cloth  6 each Topical Q0600  . enoxaparin (LOVENOX) injection  40 mg Subcutaneous QHS  . insulin aspart  0-15 Units Subcutaneous 6 times per day  . mupirocin ointment  1 application Nasal BID  . pantoprazole sodium  40 mg Per Tube Q24H  . vancomycin  1,000 mg Intravenous Q8H   Continuous Infusions: . dextrose 5 % and 0.9% NaCl 30 mL/hr at 04/14/16 0850  . feeding supplement (VITAL HIGH PROTEIN) 1,000 mL (04/14/16 0757)  . fentaNYL infusion INTRAVENOUS 125 mcg/hr (04/14/16 0901)  . propofol (DIPRIVAN) infusion 40 mcg/kg/min (04/14/16 1049)   PRN Meds:.acetaminophen (TYLENOL) oral liquid 160 mg/5 mL, diphenhydrAMINE, fentaNYL, levalbuterol, midazolam, [DISCONTINUED] ondansetron **OR** ondansetron (ZOFRAN) IV  REVIEW OF SYSTEMS:    10 Point review of Systems was done is negative except as noted above.   LABORATORY DATA:  I have reviewed the data as listed  . CBC Latest Ref Rng 04/14/2016 04/14/2016 04/13/2016  WBC 4.0 - 10.5 K/uL 10.0 9.1 10.3  Hemoglobin 12.0 - 15.0 g/dL 11.0(L) 10.2(L) 11.6(L)  Hematocrit 36.0 - 46.0 % 34.6(L) 32.4(L) 37.5  Platelets 150 - 400 K/uL 518(H) 486(H) 626(H)    . CMP Latest Ref Rng 04/14/2016 04/13/2016 04/12/2016  Glucose 65 - 99 mg/dL 115(H) 138(H) 122(H)  BUN 6 - 20 mg/dL 6 <5(L) <5(L)  Creatinine 0.44 - 1.00 mg/dL 0.50 0.47  0.46  Sodium 135 - 145 mmol/L 136 133(L) 135  Potassium 3.5 - 5.1 mmol/L 4.5 5.2(H) 4.7  Chloride 101 - 111 mmol/L 100(L) 91(L) 96(L)  CO2 22 - 32 mmol/L 33(H) 36(H) 33(H)  Calcium 8.9 - 10.3 mg/dL 7.7(L) 8.1(L) 8.0(L)  Total Protein 6.5 - 8.1 g/dL 3.8(L) - -  Total Bilirubin 0.3 - 1.2 mg/dL 0.1(L) - -  Alkaline Phos 38 - 126 U/L 65 - -  AST 15 - 41 U/L 10(L) - -  ALT 14 - 54 U/L 8(L) - -    Microscopic Comment LYMPHOMA Histologic type: Classical Hodgkin lymphoma, mixed cellularity type Grade (if applicable): N/A Flow cytometry: No monoclonal B cell population or abnormal T cell phenotype identified ([id number]). Immunohistochemical stains: LCA, cytokeratin AE1/AE3, S100, CD15 CD30, CD163, CD20, PAX-5, CD21, CD3, CD68, CD1a, CD43, CD4, CD5, CD8, ALK protein, CD45RO, CD79a, CD10, kappa and lambda with appropriate controls. Touch preps/imprints: Scanty but with scattered large atypical mononuclear and multilobated lymphoid appearing cells. Comments: The sections show diffuse effacement of the architecture by an atypical lymphohistiocytic  process characterized associated with scattered foci of necrosis. In this background, scattered individual and small clusters of large atypical mononuclear and multilobated lymphoid appearing cells are seen displaying vesicular chromatin, variably prominent nucleoli and abundant amphophilic cytoplasm. Prominent sinusoidal pattern is not appreciated and areas of collagenous fibrosis are not seen. Admixed are small lymphocytes, eosinophils, and plasma cells to variable extent. To further evaluate this process, flow cytometric analysis was performed but failed to show any monoclonal B cell population or abnormal T cell phenotype. A large battery of immunohistochemical stains were performed and show 1 of 2 FINAL for RODNESHA, ELIE [id number]) Microscopic Comment(continued) that the large atypical lymphoid appearing cells are positive for CD30 and  scattered cells for CD15. They are negative for CD3, CD20, LCA, PAX-5, ALK protein, CD10, CD4, CD43, CD45RO, CD5, CD8, CD79a, EMA, cytoplasmic kappa, cytoplasmic lambda. The lymphoid component in the background shows a mixture of T and B cells with predominance of T cells containing a mixture of CD4 and CD8 positive cells. The abundant histiocytic component in the background is positive for CD4, CD68 and CD163 and mostly negative for CD1a, S100 and CD21. No significant cytokeratin (AE1/AE3) positivity is identified. The overall histologic and immunophenotypic features are consistent with classical Hodgkin lymphoma which is best subclassified as mixed cellularity-type. (BNS:kh:ecj 03-24-16) Susanne Greenhouse MD Pathologist, Electronic Signature (Case signed 03/24/2016)    RADIOGRAPHIC STUDIES: I have personally reviewed the radiological images as listed and agreed with the findings in the report. Dg Chest 1 View  04/11/2016  CLINICAL DATA:  Shortness of breath.  History of lymphoma. EXAM: CHEST 1 VIEW COMPARISON:  Chest radiograph 04/10/2016; chest CT 04/10/2016 FINDINGS: Grossly unchanged diffuse opacification of the right hemi thorax with worsening opacities involving the left mid and upper lung. No definite pleural effusion or pneumothorax. Regional skeleton is unremarkable. IMPRESSION: Interval worsening opacities left upper lung with persistent near complete opacification of the right hemi thorax, findings overall compatible with extensive multi focal pneumonia. Known mediastinal and hilar adenopathy. Electronically Signed   By: Lovey Newcomer M.D.   On: 04/11/2016 17:03   Dg Chest 2 View  04/10/2016  CLINICAL DATA:  Patient with shortness of breath for multiple weeks. Recently diagnosed with lymphoma. EXAM: CHEST  2 VIEW COMPARISON:  Chest CT 03/16/2016; chest radiograph 03/22/2016. FINDINGS: Multiple monitoring leads overlie the patient. Stable cardiac and mediastinal contours with extensive bilateral  hilar and mediastinal adenopathy. Significant worsening of consolidation within the right lung. Persistent consolidation within the left mid lung with associated small nodular opacities. Possible right pleural effusion. No pneumothorax. Regional skeleton is unremarkable. IMPRESSION: Interval worsening of consolidation throughout the majority of the right lung which may represent malignancy, postobstructive pneumonia and/or effusion. Persistent hilar and mediastinal adenopathy. Unchanged scattered opacities and consolidation within the left lung. Electronically Signed   By: Lovey Newcomer M.D.   On: 04/10/2016 19:32   Dg Chest 2 View  03/16/2016  CLINICAL DATA:  Chest pain, shortness of breath, coughing for a couple of weeks, smoker for 15 years, asthma EXAM: CHEST  2 VIEW COMPARISON:  None. FINDINGS: Borderline cardiomegaly. There is extensive consolidation left upper lobe hilar region and perihilar region. Mass or adenopathy cannot be excluded Patchy consolidation is noted in right lower lobe. Although findings may be due to bilateral pneumonia neoplastic process cannot be excluded. Further correlation with enhanced CT scan of the chest is recommended. IMPRESSION: There is extensive consolidation left upper lobe hilar region and perihilar region. Mass or adenopathy cannot be  excluded. Patchy consolidation is noted in right lower lobe. Although findings may be due to bilateral pneumonia neoplastic process cannot be excluded. Further correlation with enhanced CT scan of the chest is recommended. These results were called by telephone at the time of interpretation on 03/16/2016 at 8:58 am to Dr. Charlann Lange , who verbally acknowledged these results. Electronically Signed   By: Lahoma Crocker M.D.   On: 03/16/2016 08:59   Ct Chest W Contrast  03/16/2016  CLINICAL DATA:  Shortness of Breath EXAM: CT CHEST WITH CONTRAST TECHNIQUE: Multidetector CT imaging of the chest was performed during intravenous contrast  administration. CONTRAST:  103m OMNIPAQUE IOHEXOL 300 MG/ML  SOLN COMPARISON:  Chest radiograph March 16, 2016 FINDINGS: Mediastinum/Lymph Nodes: Thyroid appears unremarkable. There is widespread adenopathy. There are enlarged lymph nodes throughout the right axilla with the largest individual lymph node in the right axilla measuring 3.4 x 2.6 cm. There is extensive anterior mediastinal adenopathy. The largest individual lymph node in the anterior mediastinum measures 2.3 x 1.5 cm. There are multiple right peritracheal lymph nodes with the largest individual lymph node in the right peritracheal region 9 measuring 2.0 x 1.5 cm. There are lymph nodes tracking along the left-side of the aortic arch. A lymph node in the aortopulmonary window region measures 2.0 x 1.3 cm. There are lymph nodes in each hilar region. The largest lymph node in the right hilum measures 2.3 x 1.4 cm. Lymph nodes in the left hilum appear matched Ed there are difficult to distinguish from 1 another. There is a lymph node in the inferior left hilum measuring 1.6 x 1.3 cm. There is sub- carinal adenopathy with the largest sub- carinal lymph node measuring 2.2 x 1.8 cm. There is no thoracic aortic aneurysm or dissection. The visualized great vessels appear unremarkable. There is no major vessel pulmonary embolus evident. There is a rather minimal pericardial effusion. Lungs/Pleura: There is consolidation throughout essentially the entire left upper lobe. There is abrupt termination of the proximal left upper lobe bronchus, likely due to an endobronchial lesion in the left upper lobe with postobstructive pneumonitis. There is widespread airspace consolidation throughout the right upper lobe. There is consolidation of most of the right lower lobe with narrowing of the right lower lobe bronchus. An endobronchial lesion on the right is not demonstrated, although there may well be neoplasm in the right lower lobe with surrounding pneumonitis. A lesser  degree of infiltrate is noted in the right middle lobe. Patchy pneumonia is also noted in the right lower lobe, primarily in the posterior segment region. Lingula is collapsed along with the remainder of the left upper lobe. There is no appreciable pleural effusion. Upper abdomen: In the visualized upper abdomen, there is extensive retroperitoneal adenopathy. The largest individual lymph node visualized is located between the aorta and left kidney measuring 2.1 x 1.3 cm. A second lymph node between the aorta and inferior vena cava measures 2.0 x 1.2 cm. Adrenals appear unremarkable. Visualized upper abdominal structures otherwise appear unremarkable. Musculoskeletal: No blastic or lytic bone lesions are evident. IMPRESSION: Extensive adenopathy throughout the hila, mediastinum, and right axillary region as well as in the visualized upper abdominal retroperitoneal regions. 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 Widespread pneumonitis bilaterally. There is obstruction of the left upper lobe bronchus, likely due to tumor with postobstructive pneumonitis. There is collapse of the right lower lobe without well-defined endobronchial lesion. More patchy infiltrate is noted elsewhere as noted above. Given the findings in the lungs, bronchoscopy could be most  helpful to assess degree of potential tumor versus pneumonitis. No adrenal lesions evident. Electronically Signed   By: Lowella Grip III M.D.   On: 03/16/2016 11:19   Ct Angio Chest Pe W/cm &/or Wo Cm  04/10/2016  CLINICAL DATA:  Acute onset of worsening shortness of breath. Bilateral leg swelling. Recently diagnosed with Hodgkin's lymphoma. Initial encounter. EXAM: CT ANGIOGRAPHY CHEST WITH CONTRAST TECHNIQUE: Multidetector CT imaging of the chest was performed using the standard protocol during bolus administration of intravenous contrast. Multiplanar CT image reconstructions and MIPs were obtained to evaluate the vascular anatomy. CONTRAST:  100 mL of Isovue 370  IV contrast COMPARISON:  CT of the chest performed 03/16/2016, and chest radiograph performed 04/10/2016 FINDINGS: There is no evidence of pulmonary embolus. There is near complete dense consolidation of the right lung, and central consolidation involving the left upper lobe, with additional scattered nodular opacities throughout the left lung. This is significantly worsened from the prior CT, reflecting worsening bilateral pneumonia. On correlation with right axillary lymph node biopsy results, the patient was recently diagnosed with classic Hodgkin's lymphoma. Hodgkin's lymphoma typically would not narrow the airway to the extent required for postobstructive pneumonia, and is likely unrelated to the patient's presentation with diffuse bilateral pneumonia. There is no evidence of pleural effusion or pneumothorax. Extensive enlarged mediastinal nodes are seen, particularly anteriorly, measuring up to 1.8 cm in short axis. Right axillary nodes measure up to 2.4 cm in short axis. These are compatible with the patient's Hodgkin's lymphoma. A small 3.8 cm collection of fluid at the right axilla likely reflects a postoperative seroma, status post recent right hilar lymph node biopsy. No pericardial effusion is identified. The great vessels are grossly unremarkable in appearance. The visualized portions of the thyroid gland are unremarkable in appearance. The visualized portions of the liver and spleen are unremarkable. No acute osseous abnormalities are seen. Review of the MIP images confirms the above findings. IMPRESSION: 1. No evidence of pulmonary embolus. 2. Near complete dense consolidation of the right lung, and central consolidation involving the left upper lobe, with scattered nodular opacities throughout the left lung. This is significantly worsened from the prior CT, reflecting worsening bilateral pneumonia. 3. The patient was recently diagnosed with classic Hodgkin's lymphoma for right axillary lymph node  biopsy results. The Hodgkin's lymphoma is likely unrelated to the patient's diffuse bilateral pneumonia, as lymph nodes typically do not obstruct the tracheobronchial tree. Enlarged mediastinal and right axillary nodes reflect the patient's lymphoma. 4. 3.8 cm collection of fluid at the right axilla likely reflects a postoperative seroma, status post recent right hilar lymph node biopsy. These results were called by telephone at the time of interpretation on 04/10/2016 at 9:19 pm to [facility] PA, who verbally acknowledged these results. Electronically Signed   By: Garald Balding M.D.   On: 04/10/2016 21:26   US Biopsy  03/16/2016  INDICATION: 30 year old female with new diagnosis of extensive adenopathy concerning for lymphoma. EXAM: ULTRASOUND BIOPSY CORE MEDICATIONS: None. ANESTHESIA/SEDATION: None FLUOROSCOPY TIME:  None COMPLICATIONS: None immediate. Estimated blood loss: 0 PROCEDURE: Informed written consent was obtained from the patient after a thorough discussion of the procedural risks, benefits and alternatives. All questions were addressed. A timeout was performed prior to the initiation of the procedure. The right axilla was interrogated with ultrasound. Large hypoechoic lymph nodes are identified. A suitable skin entry site was selected and marked. Local anesthesia was attained by infiltration with 1% lidocaine. A small dermatotomy was made. Under real-time sonographic guidance,  multiple 16 gauge core biopsies were obtained using a Bard automated biopsy device. Biopsy specimens were placed in saline and delivered to pathology for further analysis. Post biopsy ultrasound imaging demonstrates no evidence of bleeding or complication. IMPRESSION: Technically successful ultrasound-guided core biopsy of right axillary adenopathy. Signed, Criselda Peaches, MD [facility] Electronically Signed   By: Jacqulynn Cadet M.D.   On: 03/16/2016  16:28   Dg Chest Port 1 View  04/14/2016  CLINICAL DATA:  Endotracheal tube position, Hodgkin lymphoma. EXAM: PORTABLE CHEST 1 VIEW COMPARISON:  04/13/2016 and CT chest 04/10/2016. FINDINGS: Endotracheal tube terminates approximately 6.5 cm above carina, stable. Nasogastric tube is followed into the stomach. Left IJ central line tip projects at the junction of the brachiocephalic veins. Near complete opacification of the right hemithorax with leftward shift of the heart and mediastinum, as before. Patchy airspace consolidation bilaterally. Probable small left pleural effusion. IMPRESSION: 1. Dense consolidation throughout the right lung with patchy areas of consolidation in the left lung, likely due to pneumonia. 2. Possible small left pleural effusion. Electronically Signed   By: Lorin Picket M.D.   On: 04/14/2016 07:07   Dg Chest Port 1 View  04/13/2016  CLINICAL DATA:  Status post central catheter placement. Hodgkin's lymphoma. EXAM: PORTABLE CHEST 1 VIEW COMPARISON:  Study obtained earlier in the day as well as chest CT April 10, 2016 FINDINGS: Central catheter tip is in the superior vena cava near the junction with the left innominate vein. Endotracheal tube tip is 4.4 cm above the carina. Nasogastric tube tip and side port are below the diaphragm. No pneumothorax. There is extensive consolidation with probable superimposed effusion, causing opacification of most of the right hemithorax, stable. There are areas of consolidation in the left mid lung with slightly less overall opacity compared to earlier in the day. The heart size and pulmonary vascularity are normal. It is difficult to assess for adenopathy given the overlying areas of consolidation. No bone lesions are evident. IMPRESSION: Tube and catheter positions as described without pneumothorax. Widespread pneumonia stable on the right with slightly less overall opacity on the left compared to earlier in the day. Confluent opacity is present in  the left mid lung focally. Stable cardiac silhouette. Electronically Signed   By: Lowella Grip III M.D.   On: 04/13/2016 11:20   Dg Chest Port 1 View  04/13/2016  CLINICAL DATA:  Endotracheal orogastric tube placement EXAM: PORTABLE CHEST 1 VIEW COMPARISON:  Two days ago FINDINGS: Endotracheal tube tip just below the clavicular heads. An orogastric tube reaches the stomach. Bilateral pneumonia as demonstrated on recent chest CT, nearly confluent throughout the right lung. Air bronchograms are intermittently seen. No evidence of air leak or cavitation. No visible significant pleural effusion. Normal heart size. IMPRESSION: 1. Unremarkable positioning new endotracheal or orogastric tubes. 2.  Bilateral pneumonia without progression since 2 days ago. Electronically Signed   By: Monte Fantasia M.D.   On: 04/13/2016 08:23   Dg Chest Port 1 View  03/22/2016  CLINICAL DATA:  Cough, dyspnea at rest, bilateral parenchymal consolidation, known pneumonia. EXAM: PORTABLE CHEST 1 VIEW COMPARISON:  Portable chest x-ray of March 21, 2016 FINDINGS: The left lung is well-expanded. A large left hilar mass is stable. On the right confluent alveolar opacity occupies approximately 1/3 to 1/2 of the lung volume. This appears stable and is centered in the perihilar and infrahilar regions. The interstitial markings of both lungs are mildly prominent. The cardiac silhouette  is top-normal in size. The pulmonary vascularity is not clearly engorged. There is no pleural effusion or pneumothorax. The trachea is midline. The observed bony thorax exhibits no acute abnormality. IMPRESSION: Persistent bilateral parenchymal opacities not greatly changed from the earlier study. The findings are consistent with known malignancy with lymphadenopathy and postobstructive atelectatic changes especially on the right. Electronically Signed   By: David  Martinique M.D.   On: 03/22/2016 07:08   Dg Chest Port 1 View  03/21/2016  CLINICAL DATA:   30 year old female with ARDS and asthma. EXAM: PORTABLE CHEST 1 VIEW COMPARISON:  Radiograph dated 03/19/2016 FINDINGS: Single-view of the chest demonstrates a large area of airspace opacity in the right mid lung field as well as a smaller area of opacity in the left suprahilar region. Smaller nodular ground-glass opacity noted in the left mid lung field. Overall the lung opacities appear more confluent on this study compared to the prior study. There is no pleural effusion or pneumothorax. The cardiac silhouette is within normal limits. No acute osseous pathology identified. IMPRESSION: Bilateral airspace opacities, right greater than left. Follow-up recommended. Electronically Signed   By: Anner Crete M.D.   On: 03/21/2016 05:16   Dg Chest Port 1 View  03/19/2016  CLINICAL DATA:  Shortness of breath and cough, recent bronchoscopy with biopsy EXAM: PORTABLE CHEST 1 VIEW COMPARISON:  03/19/2016 FINDINGS: Cardiac shadow is stable. An endotracheal tube and nasogastric catheter are again seen and stable. Bilateral pulmonary infiltrates are again seen right greater than left. No pneumothorax is noted following bronchoscopy. IMPRESSION: No evidence of post bronchoscopy pneumothorax. Electronically Signed   By: Inez Catalina M.D.   On: 03/19/2016 11:20   Dg Chest Port 1 View  03/19/2016  CLINICAL DATA:  Respiratory failure. EXAM: PORTABLE CHEST 1 VIEW COMPARISON:  03/17/2016.  03/17/2016. FINDINGS: Endotracheal tube and NG tube in stable position. Heart size stable. Dense bilateral pulmonary infiltrates right side greater than left again noted. Slight interim clearing. Tiny bilateral effusions. No pneumothorax. IMPRESSION: 1.  Lines and tubes in stable position. 2. Persistent dense bilateral pulmonary infiltrates, right side greater than left. Slight interim clearing from prior exam. Tiny bilateral pleural effusions. Electronically Signed   By: Marcello Moores  Register   On: 03/19/2016 07:08   Dg Chest Port 1  View  03/17/2016  CLINICAL DATA:  Evaluate tube placement. EXAM: PORTABLE CHEST 1 VIEW COMPARISON:  Portable film earlier today. FINDINGS: Endotracheal tube has been inserted in good position lying 4.4 cm above the carina. Orogastric tube has also been inserted and is coronal within the stomach. BILATERAL pulmonary opacities appear stable, worse on the RIGHT. IMPRESSION: Satisfactory endotracheal tube and orogastric tube placement. Electronically Signed   By: Staci Righter M.D.   On: 03/17/2016 11:44   Dg Chest Port 1 View  03/17/2016  CLINICAL DATA:  Acute respiratory failure. Lung consolidation. Extensive adenopathy. EXAM: PORTABLE CHEST 1 VIEW COMPARISON:  Chest x-ray and CT scan dated 03/16/2016 FINDINGS: Extensive bilateral lung consolidation/infiltrates persist. Increased density at the left lung apex is felt to represent loculated pleural fluid. Heart size and pulmonary vascularity are normal. Stable slight thoracic scoliosis. IMPRESSION: Persistent extensive bilateral lung consolidation/pulmonary infiltrates. This probably represents a combination of tumor infiltration and pneumonia. New small loculated effusion at the left apex. Electronically Signed   By: Lorriane Shire M.D.   On: 03/17/2016 11:00   Dg C-arm Bronchoscopy  04/14/2016  CLINICAL DATA:  C-ARM BRONCHOSCOPY Fluoroscopy was utilized by the requesting physician.  No radiographic interpretation.  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 She was noted to have significant constitutional symptoms including significant weight loss, fevers chills and some night sweats. HIV negative Hepatitis C and hepatitis B serologies negative. Echo with normal ejection fraction.  #2 hypoxic respiratory failure with dense right lung consolidation and left upper lobe  consolidation. Could be Hodgkin's lymphoma involvement of the lung. Cannot rule out superadded pneumonia.  Plan -Patient has had a bronchoscopy with BAL -will need to follow up cultures. -Continues to be on mechanical ventilatory support and sedation at this time. -Discussed with Dr. Halford Chessman - since there is significant concern that her lung manifestation could certainly represent progression of her Hodgkin's lymphoma we shall start her on high-dose Solu-Medrol 272m every 6 hours for 8-12 doses. -She is on broad-spectrum antibiotics for coverage of possible healthcare required/postobstructive pneumonia. -The patient's respiratory condition improves with antibiotics and high-dose steroids he would hope that she can get extubated and provide personal consent for starting chemotherapy as inpatient. -If the patient's respiratory condition remains limiting for extubation and her cultures are negative we shall talk to her family for consent regarding starting chemotherapy in the next couple of days.  -Would plan to do  AVD (ABVD without bleomycin given her current lung status ) -She has 3 children and previously on her last admission discussion noted and she wasn't really intending to have more. ABVD in general does not have significant concern with fertility loss. -Would hold off on port placement at this time given possibility of active infection . -Might need PICC line placement if not done already . -Ideally would have liked to get a PET/CT scan prior to starting treatment but will not be able to do one as inpatient and would not want to delay treatment on this account. -She will need to continue absolute smoking cessation. -Might need insulin sliding scale in the setting of high dose steroids will defer this to the medical care team.  Appreciate the excellent cares by the critical care team .  I shall continue to follow the patient daily .  I spent 40 minutes counseling the patient face to face.  The total time spent in the appointment was 45 minutes and more than 50% was on counseling and direct patient cares.    GSullivan LoneMD MGarrisonAAHIVMS SBaptist [facility] Hospital - Thousand OaksCProhealth Ambulatory Surgery Center IncHematology/Oncology Physician CChi [facility] (Office):       [phone](Work cell):  [phone](Fax):           [phone] 04/14/2016 1:48 PM

## 2016-04-14 NOTE — Progress Notes (Signed)
Pharmacy: Re-vancomycin  Patient's a 30 y.o F currently on vancomycin for PNA.  Vancomycin trough level now back below goal at 12 (goal 15-20).    Plan: - increase vancomycin to 1250 mg IV q8h - will recheck level at steady state  Dia Sitter, PharmD, BCPS 04/14/2016 10:31 PM

## 2016-04-14 NOTE — Procedures (Signed)
Bedside Bronchoscopy Procedure Note Heidi [name] [id number] March 26, 1986  Procedure: Bronchoscopy Indications: Obtain specimens for culture and/or other diagnostic studies  Procedure Details: ET Tube Size: 7.5 ET Tube secured at lip (cm):23 Bite block in place: Yes In preparation for procedure, Patient hyper-oxygenated with 100 % FiO2 Airway entered and the following bronchi were examined: RUL, RML and RLL.   Bronchoscope removed.  , Patient remained on 100% FiO2 throughout the procedure.    Evaluation BP 107/56 mmHg  Pulse 128  Temp(Src) 99.1 F (37.3 C) (Oral)  Resp 18  Ht 5\' 6"  (1.676 m)  Wt 136 lb 7.4 oz (61.9 kg)  BMI 22.04 kg/m2  SpO2 99%  LMP 01/11/2016 (Approximate) Breath Sounds:Clear O2 sats: stable throughout Patient's Current Condition: stable Specimens:  Transbronchial biopsies, BAL Complications: No apparent complications Patient did tolerate procedure well.   Kathie Dike 04/14/2016, 1:22 PM

## 2016-04-14 NOTE — Progress Notes (Signed)
Pharmacy Antibiotic Note  Heidi [name] is a 30 y.o. female with recent diagnosis of Hodgkin's lymphoma and recently discharged from the hospital on 3/28,  admitted on 04/10/2016 with pneumonia.  Patient is currently on cefepime and vancomycin day #5 for PNA.   Plan: - Continue vancomycin 1g q8 - check VT tonight prior to 10pm dose - Continue Cefepime 2g IV q8h per current renal function - Continue to monitor renal function, cultures, clinical course.  ___________________  Height: 5\' 6"  (167.6 cm) Weight: 136 lb 7.4 oz (61.9 kg) IBW/kg (Calculated) : 59.3  Temp (24hrs), Avg:97.9 F (36.6 C), Min:97.2 F (36.2 C), Max:99.1 F (37.3 C)   Recent Labs Lab 04/10/16 1930 04/10/16 1953 04/11/16 0333 04/12/16 0316 04/12/16 1533 04/13/16 0306 04/13/16 0840 04/14/16 0600 04/14/16 0900  WBC 9.5  --  9.5 10.2  --  10.3  --  9.1 10.0  CREATININE 0.58  --  0.47 0.46  --  0.47  --  0.50  --   LATICACIDVEN  --  1.50  --   --   --   --  1.3  --   --   VANCOTROUGH  --   --   --   --  8*  --   --   --   --     Estimated Creatinine Clearance: 96.3 mL/min (by C-G formula based on Cr of 0.5).    No Known Allergies  Antimicrobials this admission: 4/15 Vancomycin  >> 4/15 Cefepime  >>   Dose adjustments this admission: 4/17: changed cefepime to 2g q8h for HAP 4/17: VT = 8 mcg/mL on 500mg  q8h, change to 1g q8h  Microbiology results: 4/16 MRSA PCR (+) on chlx and bactroban 4/16 bcx x2: NGTD 4/16 strep pneu (-) 4/18 sputum: pending   Thank you for allowing pharmacy to be a part of this patient's care.   Adrian Saran, PharmD, BCPS Pager [phone] 04/14/2016 9:57 AM

## 2016-04-14 NOTE — Op Note (Signed)
[facility] Cardiopulmonary Patient Name: Heidi [name] Procedure Date: 04/14/2016 MRN: [id number] Attending MD: Chesley Mires , MD Date of Birth: March 27, 1986 CSN: [id number] Age: 30 Admit Type: Inpatient Ethnicity: Not Hispanic or Latino Procedure:            Bronchoscopy Indications:          Non resolving lung infiltrates with history of                        Hodgkin's lymphoma.                       Unresolving right middle lobe infiltrate, Unresolving                        right lower lobe infiltrate Providers:            Chesley Mires, MD, Ashley Mariner, Phillis Knack Referring MD:          Medicines:             Complications:        No immediate complications Estimated Blood Loss: Estimated blood loss was minimal. Procedure:      Pre-Anesthesia Assessment:      - A History and Physical has been performed. The patient's medications,       allergies and sensitivities have been reviewed.      - The patient is unable to give consent secondary to the patient's       altered mental status. The risks and benefits of the procedure and the       sedation options and risks were discussed with the patient's mother. All       questions were answered and informed consent was obtained.      - ASA Grade Assessment: III - A patient with severe systemic disease.      After obtaining informed consent, the bronchoscope was passed under       direct vision. Throughout the procedure, the patient's blood pressure,       pulse, and oxygen saturations were monitored continuously.      The procedure was done in the ICU. Patient was previously intubated for       respiratory failure. The oxygen setting was increased to 100%. The       bronchoscope was entered through the endotracheal tube. She had       difficulty maintaining her oxygenation, and therefore full airway       inspection was not performed.      The bronchoscope was directed to the right maint bronchus. There   appeared to be diffuse airway edema and extrinsic compression. There       were also shiny, pearl like studding along the visible airways.      The bronchoscope was directed toward the right lower lobe.       Transbronchial biopsy was performed x 3 using fluoroscopic guidance.       Total fluoroscopy time was 21 seconds.      The bronchoscope was then wedged into the right lower lobe. Instilled 60       ml of saline with 15 ml of cloudy, white to yellow fluid returned.      The bronchoscope was withdrawn. She had improvement in her oxygen       saturation after this. Findings:      Right Lung Abnormalities: Extrinsic compression was found.  Right Lung Abnormalities: An area of inflamed mucosa was found       throughout the right tracheobronchial tree and in the right mainstem       bronchus. Impression:      - Extrinsic compression was found.      - Mucosal inflammation was visualized throughout the tracheobronchial       tree and in the right mainstem bronchus.      - A neoplasm is suspected.      - Transbronchial lung biopsies were performed.      - Bronchoalveolar lavage was performed. Moderate Sedation:      Diprivan, fentanyl.      Moderate (conscious) sedation was personally administered by the       endoscopist. The following parameters were monitored: oxygen saturation,       heart rate, blood pressure, and response to care. Total physician       intraservice time was 21 minutes. Recommendation:      - Await BAL results.      - Await biopsy results. Procedure Code(s):      --- Professional ---      [phone], Bronchoscopy, rigid or flexible, including fluoroscopic guidance,       when performed; with transbronchial lung biopsy(s), single lobe      [id number], Bronchoscopy, rigid or flexible, including fluoroscopic guidance,       when performed; with bronchial alveolar lavage Diagnosis Code(s):      --- Professional ---      C81.90, Hodgkin lymphoma, unspecified, unspecified  site      R91.8, Other nonspecific abnormal finding of lung field CPT copyright 2016 [employer]. All rights reserved. The codes documented in this report are preliminary and upon coder review may  be revised to meet current compliance requirements. Chesley Mires, MD Chesley Mires, MD 04/14/2016 5:36:01 PM This report has been signed electronically. Number of Addenda: 0 Scope In: 12:57:47 PM Scope Out: 1:07:47 PM

## 2016-04-15 ENCOUNTER — Inpatient Hospital Stay (HOSPITAL_COMMUNITY): Payer: Self-pay

## 2016-04-15 DIAGNOSIS — R140 Abdominal distension (gaseous): Secondary | ICD-10-CM

## 2016-04-15 LAB — BASIC METABOLIC PANEL
ANION GAP: 8 (ref 5–15)
BUN: 8 mg/dL (ref 6–20)
CHLORIDE: 97 mmol/L — AB (ref 101–111)
CO2: 34 mmol/L — AB (ref 22–32)
Calcium: 8.2 mg/dL — ABNORMAL LOW (ref 8.9–10.3)
Creatinine, Ser: 0.45 mg/dL (ref 0.44–1.00)
GFR calc Af Amer: >60 mL/min (ref >60)
GFR calc non Af Amer: >60 mL/min (ref >60)
GLUCOSE: 151 mg/dL — AB (ref 65–99)
POTASSIUM: 4.8 mmol/L (ref 3.5–5.1)
Sodium: 139 mmol/L (ref 135–145)

## 2016-04-15 LAB — GLUCOSE, CAPILLARY
GLUCOSE-CAPILLARY: 95 mg/dL (ref 65–99)
GLUCOSE-CAPILLARY: 130 mg/dL — AB (ref 65–99)
GLUCOSE-CAPILLARY: 131 mg/dL — AB (ref 65–99)
Glucose-Capillary: 133 mg/dL — ABNORMAL HIGH (ref 65–99)
Glucose-Capillary: 132 mg/dL — ABNORMAL HIGH (ref 65–99)
Glucose-Capillary: 133 mg/dL — ABNORMAL HIGH (ref 65–99)

## 2016-04-15 LAB — PROCALCITONIN: PROCALCITONIN: 0.67 ng/mL

## 2016-04-15 LAB — ACID FAST SMEAR (AFB, MYCOBACTERIA): Acid Fast Smear: NEGATIVE

## 2016-04-15 LAB — ACID FAST SMEAR (AFB)

## 2016-04-15 MED ORDER — SODIUM CHLORIDE 0.9 % IV SOLN
250.0000 mg | Freq: Four times a day (QID) | INTRAVENOUS | Status: AC
  Administered 2016-04-15 – 2016-04-16 (×5): 250 mg via INTRAVENOUS
  Filled 2016-04-15 (×5): qty 4

## 2016-04-15 NOTE — Progress Notes (Signed)
PULMONARY / CRITICAL CARE MEDICINE   Name: Heidi [name] MRN: [id number] DOB: June 17, 1986    ADMISSION DATE:  04/10/2016 CONSULTATION DATE:  04/13/2016  REFERRING MD:  Triad  CHIEF COMPLAINT:  Short of breath  SUBJECTIVE:  Remains on full vent support.  VITAL SIGNS: BP 127/59 mmHg  Pulse 104  Temp(Src) 98.8 F (37.1 C) (Oral)  Resp 18  Ht 5\' 6"  (1.676 m)  Wt 136 lb 7.4 oz (61.9 kg)  BMI 22.04 kg/m2  SpO2 98%  LMP 01/11/2016 (Approximate)  VENTILATOR SETTINGS: Vent Mode:  [-] PRVC FiO2 (%):  [40 %-50 %] 40 % Set Rate:  [18 bmp] 18 bmp Vt Set:  [480 mL] 480 mL PEEP:  [5 cmH20] 5 cmH20 Plateau Pressure:  [27 cmH20-33 cmH20] 32 cmH20  INTAKE / OUTPUT: I/O last 3 completed shifts: In: 6283.1 [I.V.:3352.8; NG/GT:1526.3; IV Piggyback:1404] Out: 1760 [id number]  PHYSICAL EXAMINATION: General: sedated Neuro: RASS -2 HEENT:  ETT in place Cardiovascular:  Regular, tachycardic Lungs:  Coarse crackles upper lobes b/l, decreased BS Rt lower lung Abdomen:  Soft, non tender Musculoskeletal:  No edema Skin:  No rashes  LABS:  BMET  Recent Labs Lab 04/13/16 0306 04/14/16 0600 04/15/16 0446  NA 133* 136 139  K 5.2* 4.5 4.8  CL 91* 100* 97*  CO2 36* 33* 34*  BUN <5* 6 8  CREATININE 0.47 0.50 0.45  GLUCOSE 138* 115* 151*    Electrolytes  Recent Labs Lab 04/13/16 0306 04/14/16 0600 04/15/16 0446  CALCIUM 8.1* 7.7* 8.2*  MG  --  1.7  --   PHOS  --  2.0*  --     CBC  Recent Labs Lab 04/13/16 0306 04/14/16 0600 04/14/16 0900  WBC 10.3 9.1 10.0  HGB 11.6* 10.2* 11.0*  HCT 37.5 32.4* 34.6*  PLT 626* 486* 518*    Coag's No results for input(s): APTT, INR in the last 168 hours.  Sepsis Markers  Recent Labs Lab 04/10/16 1953 04/13/16 0840 04/14/16 0600 04/15/16 0446  LATICACIDVEN 1.50 1.3  --   --   PROCALCITON  --  0.17 0.16 0.67    ABG  Recent Labs Lab 04/13/16 0645 04/13/16 0900 04/14/16 0310  PHART 7.130* 7.238* 7.392   PCO2ART VALUE ABOVE REPORTABLE RANGE 79.1* 56.1*  PO2ART 64.6* 84.3 61.0*    Liver Enzymes  Recent Labs Lab 04/10/16 1930 04/14/16 0600  AST 16 10*  ALT 15 8*  ALKPHOS 107 65  BILITOT 0.1* 0.1*  ALBUMIN 2.2* 1.5*    Cardiac Enzymes No results for input(s): TROPONINI, PROBNP in the last 168 hours.  Glucose  Recent Labs Lab 04/14/16 0804 04/14/16 0827 04/14/16 1615 04/14/16 1949 04/15/16 0054 04/15/16 0436  GLUCAP 63* 119* 115* 107* 133* 132*    Imaging Dg Chest Port 1 View  04/15/2016  CLINICAL DATA:  Respiratory failure, evaluate endotracheal tube position EXAM: PORTABLE CHEST 1 VIEW COMPARISON:  04/14/2016 FINDINGS: Endotracheal tube unchanged with tip 5.5 cm above the carina. Orogastric and left IJ support devices stable. Extensive opacification over the right lung stable. Patchy opacification over the left lung stable. IMPRESSION: No change from prior study. Electronically Signed   By: Skipper Cliche M.D.   On: 04/15/2016 07:00   Dg Chest Port 1 View  04/14/2016  CLINICAL DATA:  30 year old female with history of right lower lobe biopsy. EXAM: PORTABLE CHEST 1 VIEW COMPARISON:  Chest x-ray 04/14/2016. FINDINGS: An endotracheal tube is in place with tip 5.8 cm above the carina. There is a  left-sided internal jugular central venous catheter with tip terminating in the proximal superior vena cava. A nasogastric tube is seen extending into the stomach, however, the tip of the nasogastric tube extends below the lower margin of the image. Again noted is extensive airspace consolidation throughout the lungs bilaterally (right greater than left), with some improving aeration in the right upper lobe compared to the prior examination. Small left pleural effusion. Possible right pleural effusion, difficult to judge given the extent of right-sided airspace consolidation on this single view examination. No evidence of pulmonary edema. Heart size is upper limits of normal. Upper  mediastinal contours are within normal limits. IMPRESSION: 1. Slight improvement in aeration in the right upper lobe. Otherwise, the appearance the chest is similar, again most compatible with multilobar pneumonia. 2. Support apparatus, as above. Electronically Signed   By: Vinnie Langton M.D.   On: 04/14/2016 13:48   Dg C-arm Bronchoscopy  04/14/2016  CLINICAL DATA:  C-ARM BRONCHOSCOPY Fluoroscopy was utilized by the requesting physician.  No radiographic interpretation.     STUDIES:  4/15 CT angio chest >> near complete consolidation Rt lung and central Lt lung, mediastinal LAN 4/19 Bronchoscopy >>  CULTURES: 4/16 Blood >> 4/18 Sputum >> 4/19 BAL >> 4/19 BAL AFB >> 4/19 BAL Fungal >>  ANTIBIOTICS: 4/15 Vancomycin >> 4/15 Cefepime >>   SIGNIFICANT EVENTS: 3/21-3/28 Admit for PNA, ARDS and had Rt axillary LN bx >> Hodgkin's lymphoma; seen by oncology 4/15 Admit 4/18 VDRF due to hypoxia/hypercapnia 4/19 Oncology consulted >> started high dose steroids  LINES/TUBES: 4/18 ETT >> 4/18 Lt IJ CVL >>   DISCUSSION: 30 yo female smoker was was in hospital from 3/21 to 3/28 with PNA and dx with Hodgkin's lymphoma.  She presented 4/15 with progressive dyspnea for worsening pneumonia.  She had worsening hypoxia/hypercapnia, and required intubation.  She has hx of asthma, HTN..  ASSESSMENT / PLAN:  PULMONARY A: Acute hypoxic/hypercapnic respiratory failure 2nd to progressive pulmonary infiltrates >> ?HCAP versus progression of lymphoma. Tobacco abuse. Hx of asthma. P:   Full vent support F/u CXR Scheduled BDs F/u TBx from bronchoscopy on 4/19  CARDIOVASCULAR A:  Hypotension after intubation >> resolved. Hx of HTN. P:  Change to 30 ml/hr Hold labetalol  RENAL A:   Hyperkalemia >> likely from acidosis >> resolved. P:   F/u BMET  GASTROINTESTINAL A:   Protein calorie malnutrition. P:   Tube feeds while on vent Protonix for SUP  HEMATOLOGIC A:   Recent dx of  Hodgkin's lymphoma. P:  High dose steroids started 4/19  INFECTIOUS A:   ?HCAP >> procalcitonin not significantly elevated. P:   Day 6/7 of vancomycin, cefepime  ENDOCRINE A:   Episodes of hypoglycemia. P:   Added dextrose to IV fluid 4/19 F/u CBG's  NEUROLOGIC A:   Acute metabolic encephalopathy. P:   RASS goal: -1 to -2  SUMMARY: She has progressive Rt > Lt ASD.  She has been on several courses of broad spectrum antibiotics w/o improvement, and currently does not have significant elevation in procalcitonin.  Concern is that lung infiltrates represent her lymphoma rather than infection.  High dose steroids started 4/19 >> has some improvement in respiratory mechanics and CXR.   CC time 31 minutes.  Chesley Mires, MD [facility] Pulmonary/Critical Care 04/15/2016, 8:09 AM Pager:  [phone] After 3pm call: [phone]

## 2016-04-16 ENCOUNTER — Inpatient Hospital Stay (HOSPITAL_COMMUNITY): Payer: Self-pay

## 2016-04-16 ENCOUNTER — Other Ambulatory Visit: Payer: Self-pay | Admitting: *Deleted

## 2016-04-16 DIAGNOSIS — C8178 Other classical Hodgkin lymphoma, lymph nodes of multiple sites: Secondary | ICD-10-CM

## 2016-04-16 DIAGNOSIS — R140 Abdominal distension (gaseous): Secondary | ICD-10-CM | POA: Diagnosis present

## 2016-04-16 LAB — TRIGLYCERIDES: Triglycerides: 68 mg/dL (ref <150)

## 2016-04-16 LAB — BLOOD GAS, ARTERIAL
ACID-BASE EXCESS: 7.2 mmol/L — AB (ref 0.0–2.0)
ACID-BASE EXCESS: 6.6 mmol/L — AB (ref 0.0–2.0)
BICARBONATE: 33.7 meq/L — AB (ref 20.0–24.0)
Bicarbonate: 34 mEq/L — ABNORMAL HIGH (ref 20.0–24.0)
DRAWN BY: 225631
Drawn by: 225631
FIO2: 0.4
FIO2: 0.4
LHR: 18 {breaths}/min
MECHVT: 0.48 mL
O2 SAT: 87.5 %
O2 SAT: 85 %
PATIENT TEMPERATURE: 98.6
PCO2 ART: 70.3 mmHg — AB (ref 35.0–45.0)
PEEP: 5 cmH2O
PEEP: 5 cmH2O
PH ART: 7.352 (ref 7.350–7.450)
PH ART: 7.306 — AB (ref 7.350–7.450)
Patient temperature: 98.6
RATE: 18 resp/min
TCO2: 31.8 mmol/L (ref 0–100)
TCO2: 32.5 mmol/L (ref 0–100)
VT: 480 mL
pCO2 arterial: 62.3 mmHg (ref 35.0–45.0)
pO2, Arterial: 60.7 mmHg — ABNORMAL LOW (ref 80.0–100.0)
pO2, Arterial: 61 mmHg — ABNORMAL LOW (ref 80.0–100.0)

## 2016-04-16 LAB — CULTURE, BLOOD (ROUTINE X 2)
Culture: NO GROWTH
Culture: NO GROWTH

## 2016-04-16 LAB — GLUCOSE, CAPILLARY
GLUCOSE-CAPILLARY: 132 mg/dL — AB (ref 65–99)
GLUCOSE-CAPILLARY: 142 mg/dL — AB (ref 65–99)
GLUCOSE-CAPILLARY: 144 mg/dL — AB (ref 65–99)
GLUCOSE-CAPILLARY: 148 mg/dL — AB (ref 65–99)
GLUCOSE-CAPILLARY: 152 mg/dL — AB (ref 65–99)
Glucose-Capillary: 134 mg/dL — ABNORMAL HIGH (ref 65–99)
Glucose-Capillary: 144 mg/dL — ABNORMAL HIGH (ref 65–99)

## 2016-04-16 LAB — BASIC METABOLIC PANEL
Anion gap: 9 (ref 5–15)
BUN: 14 mg/dL (ref 6–20)
CHLORIDE: 99 mmol/L — AB (ref 101–111)
CO2: 35 mmol/L — ABNORMAL HIGH (ref 22–32)
CREATININE: 0.46 mg/dL (ref 0.44–1.00)
Calcium: 8.4 mg/dL — ABNORMAL LOW (ref 8.9–10.3)
GFR calc Af Amer: >60 mL/min (ref >60)
GLUCOSE: 144 mg/dL — AB (ref 65–99)
POTASSIUM: 5 mmol/L (ref 3.5–5.1)
SODIUM: 143 mmol/L (ref 135–145)

## 2016-04-16 LAB — PROCALCITONIN: PROCALCITONIN: 0.5 ng/mL

## 2016-04-16 LAB — CBC
HCT: 30.5 % — ABNORMAL LOW (ref 36.0–46.0)
Hemoglobin: 9.6 g/dL — ABNORMAL LOW (ref 12.0–15.0)
MCH: 30.2 pg (ref 26.0–34.0)
MCHC: 31.5 g/dL (ref 30.0–36.0)
MCV: 95.9 fL (ref 78.0–100.0)
PLATELETS: 512 10*3/uL — AB (ref 150–400)
RBC: 3.18 MIL/uL — ABNORMAL LOW (ref 3.87–5.11)
RDW: 14.8 % (ref 11.5–15.5)
WBC: 16.6 10*3/uL — ABNORMAL HIGH (ref 4.0–10.5)

## 2016-04-16 LAB — CULTURE, RESPIRATORY W GRAM STAIN

## 2016-04-16 LAB — VANCOMYCIN, TROUGH: Vancomycin Tr: 16 ug/mL (ref 10.0–20.0)

## 2016-04-16 LAB — CULTURE, RESPIRATORY: CULTURE: NO GROWTH

## 2016-04-16 LAB — PHOSPHORUS: PHOSPHORUS: 3.4 mg/dL (ref 2.5–4.6)

## 2016-04-16 LAB — MAGNESIUM: MAGNESIUM: 1.9 mg/dL (ref 1.7–2.4)

## 2016-04-16 MED ORDER — SODIUM CHLORIDE 0.9 % IV SOLN
Freq: Once | INTRAVENOUS | Status: AC
  Administered 2016-04-16: 18:00:00 via INTRAVENOUS
  Filled 2016-04-16: qty 5

## 2016-04-16 MED ORDER — COLD PACK MISC ONCOLOGY
1.0000 | Freq: Once | Status: DC | PRN
  Filled 2016-04-16: qty 1

## 2016-04-16 MED ORDER — DEXAMETHASONE SODIUM PHOSPHATE 4 MG/ML IJ SOLN
4.0000 mg | Freq: Two times a day (BID) | INTRAMUSCULAR | Status: AC
  Administered 2016-04-17 – 2016-04-19 (×6): 4 mg via INTRAVENOUS
  Filled 2016-04-16 (×6): qty 1

## 2016-04-16 MED ORDER — ALTEPLASE 2 MG IJ SOLR: 2.0000 mg | Freq: Once | INTRAMUSCULAR | Status: DC | PRN

## 2016-04-16 MED ORDER — DOXORUBICIN HCL CHEMO IV INJECTION 2 MG/ML
25.0000 mg/m2 | Freq: Once | INTRAVENOUS | Status: AC
  Administered 2016-04-16: 46 mg via INTRAVENOUS
  Filled 2016-04-16: qty 23

## 2016-04-16 MED ORDER — SODIUM CHLORIDE 0.9% FLUSH
3.0000 mL | INTRAVENOUS | Status: DC | PRN
Start: 2016-04-16 — End: 2016-04-26

## 2016-04-16 MED ORDER — DACARBAZINE 200 MG IV SOLR
375.0000 mg/m2 | Freq: Once | INTRAVENOUS | Status: AC
  Administered 2016-04-16: 700 mg via INTRAVENOUS
  Filled 2016-04-16: qty 35

## 2016-04-16 MED ORDER — HEPARIN SOD (PORK) LOCK FLUSH 100 UNIT/ML IV SOLN
500.0000 [IU] | Freq: Once | INTRAVENOUS | Status: DC | PRN
  Filled 2016-04-16: qty 5

## 2016-04-16 MED ORDER — SODIUM CHLORIDE 0.9% FLUSH
10.0000 mL | INTRAVENOUS | Status: DC | PRN
  Administered 2016-04-22: 10 mL
  Filled 2016-04-16: qty 10

## 2016-04-16 MED ORDER — VINBLASTINE SULFATE CHEMO INJECTION 1 MG/ML
6.0000 mg/m2 | Freq: Once | INTRAVENOUS | Status: AC
  Administered 2016-04-16: 11.1 mg via INTRAVENOUS
  Filled 2016-04-16: qty 11.1

## 2016-04-16 MED ORDER — HEPARIN SOD (PORK) LOCK FLUSH 100 UNIT/ML IV SOLN
250.0000 [IU] | Freq: Once | INTRAVENOUS | Status: DC | PRN
  Filled 2016-04-16: qty 3

## 2016-04-16 MED ORDER — PALONOSETRON HCL INJECTION 0.25 MG/5ML
0.2500 mg | Freq: Once | INTRAVENOUS | Status: AC
  Administered 2016-04-16: 0.25 mg via INTRAVENOUS
  Filled 2016-04-16: qty 5

## 2016-04-16 MED ORDER — DIATRIZOATE MEGLUMINE & SODIUM 66-10 % PO SOLN
30.0000 mL | Freq: Once | ORAL | Status: AC
  Administered 2016-04-16: 30 mL via ORAL
  Filled 2016-04-16: qty 30

## 2016-04-16 MED ORDER — VITAL HIGH PROTEIN PO LIQD
1000.0000 mL | ORAL | Status: DC
  Administered 2016-04-18: 1000 mL
  Filled 2016-04-16 (×3): qty 1000

## 2016-04-16 MED ORDER — SODIUM CHLORIDE 0.9 % IV SOLN
Freq: Once | INTRAVENOUS | Status: AC
  Administered 2016-04-16: 19:00:00 via INTRAVENOUS

## 2016-04-16 MED ORDER — HOT PACK MISC ONCOLOGY
1.0000 | Freq: Once | Status: AC | PRN
  Filled 2016-04-16: qty 1

## 2016-04-16 MED ORDER — TBO-FILGRASTIM 480 MCG/0.8ML ~~LOC~~ SOSY
480.0000 ug | PREFILLED_SYRINGE | Freq: Every day | SUBCUTANEOUS | Status: AC
  Administered 2016-04-18 – 2016-04-22 (×5): 480 ug via SUBCUTANEOUS
  Filled 2016-04-16 (×8): qty 0.8

## 2016-04-16 NOTE — Progress Notes (Signed)
Reviewed Rt lower lung Tbx results from 04/14/16 >> consistent with Hodgkin's lymphoma.  Attempted to call pt's mother to update status >> no answer at number listed.  Likely can start chemotherapy later today per oncology if CT abd/pelvis is unremarkable.  Chesley Mires, MD [facility] Pulmonary/Critical Care 04/16/2016, 11:21 AM Pager:  [phone] After 3pm call: [phone]

## 2016-04-16 NOTE — Progress Notes (Signed)
Chemo dosages, rate and dilution verified by 2 RNs.

## 2016-04-16 NOTE — Progress Notes (Signed)
E-LINK called with second critical ABG results after making RN aware of both, first ABG results questionable for mixed Venous drawl which was compared to second ABG drawl with both being similar, spoke with [facility] RN and left RT phone number for Dr. Jimmy Footman to return call so she could be made aware of Critical results and RT's plan for ventilator setting changes with set rate >'d to 22 from 18/Fi02 >'d to .60 from .40 along with peep >'d from 5.0 to 8.0, RT to monitor.

## 2016-04-16 NOTE — Progress Notes (Signed)
PULMONARY / CRITICAL CARE MEDICINE   Name: Heidi [name] MRN: [id number] DOB: 1986-06-19    ADMISSION DATE:  04/10/2016 CONSULTATION DATE:  04/13/2016  REFERRING MD:  [facility]  CHIEF COMPLAINT:  Short of breath  SUBJECTIVE:  Remains on full vent support.  Had FiO2/PEEP and RR increased on Vent.  Going for CT abd/pelvis later today.  VITAL SIGNS: BP 135/61 mmHg  Pulse 79  Temp(Src) 98.9 F (37.2 C) (Axillary)  Resp 22  Ht 5\' 6"  (1.676 m)  Wt 162 lb 0.6 oz (73.5 kg)  BMI 26.17 kg/m2  SpO2 94%  LMP 01/11/2016 (Approximate)  VENTILATOR SETTINGS: Vent Mode:  [-] PRVC FiO2 (%):  [40 %-60 %] 60 % Set Rate:  [18 bmp-22 bmp] 22 bmp Vt Set:  [480 mL] 480 mL PEEP:  [5 cmH20-8 cmH20] 8 cmH20 Plateau Pressure:  [31 cmH20-34 cmH20] 32 cmH20  INTAKE / OUTPUT: I/O last 3 completed shifts: In: 5601.2 [I.V.:2330.2; NG/GT:1755; IV Piggyback:1516] Out: 1350 [id number]  PHYSICAL EXAMINATION: General: sedated Neuro: RASS -2, follows commands/moves extremities on WUA HEENT:  ETT in place Cardiovascular:  Regular Lungs:  Bronchial breath sounds with hollow breath sounds on Rt, faint crackles on Lt Abdomen:  Soft, non tender Musculoskeletal:  No edema Skin:  No rashes  LABS:  BMET  Recent Labs Lab 04/14/16 0600 04/15/16 0446 04/16/16 0350  NA 136 139 143  K 4.5 4.8 5.0  CL 100* 97* 99*  CO2 33* 34* 35*  BUN 6 8 14   CREATININE 0.50 0.45 0.46  GLUCOSE 115* 151* 144*    Electrolytes  Recent Labs Lab 04/14/16 0600 04/15/16 0446 04/16/16 0350  CALCIUM 7.7* 8.2* 8.4*  MG 1.7  --  1.9  PHOS 2.0*  --  3.4    CBC  Recent Labs Lab 04/14/16 0600 04/14/16 0900 04/16/16 0518  WBC 9.1 10.0 16.6*  HGB 10.2* 11.0* 9.6*  HCT 32.4* 34.6* 30.5*  PLT 486* 518* 512*    Coag's No results for input(s): APTT, INR in the last 168 hours.  Sepsis Markers  Recent Labs Lab 04/10/16 1953  04/13/16 0840 04/14/16 0600 04/15/16 0446 04/16/16 0350  LATICACIDVEN 1.50   --  1.3  --   --   --   PROCALCITON  --   < > 0.17 0.16 0.67 0.50  < > = values in this interval not displayed.  ABG  Recent Labs Lab 04/14/16 0310 04/16/16 0447 04/16/16 0507  PHART 7.392 7.306* 7.352  PCO2ART 56.1* 70.3* 62.3*  PO2ART 61.0* 60.7* 61.0*    Liver Enzymes  Recent Labs Lab 04/10/16 1930 04/14/16 0600  AST 16 10*  ALT 15 8*  ALKPHOS 107 65  BILITOT 0.1* 0.1*  ALBUMIN 2.2* 1.5*    Cardiac Enzymes No results for input(s): TROPONINI, PROBNP in the last 168 hours.  Glucose  Recent Labs Lab 04/15/16 0756 04/15/16 1131 04/15/16 1540 04/15/16 2010 04/15/16 2352 04/16/16 0319  GLUCAP 130* 131* 133* 132* 152* 148*    Imaging Dg Chest Port 1 View  04/16/2016  CLINICAL DATA:  Respiratory failure healthcare associated pneumonia intubated patient EXAM: PORTABLE CHEST 1 VIEW COMPARISON:  Portable chest x-ray of April 15, 2016 FINDINGS: The right hemi thorax remains largely obscured with only a small amount of aerated upper lobe visible. The left lung is better aerated but increased density has developed in the retrocardiac region and there is further obscuration of the left hemidiaphragm. A small portion of the left heart border is visible. The pulmonary vascularity is  indistinct. The endotracheal tube tip lies 6.4 cm above the carina. The esophagogastric tube tip projects below the inferior margin of the image. The left internal jugular venous catheter tip projects over the proximal SVC. IMPRESSION: Near total opacification of the right hemi thorax consistent with pneumonia and pleural effusion. Increasing opacity on the left consistent with progressive pneumonia. No significant left-sided pleural effusion is visible but is likely obscured. Electronically Signed   By: David  Martinique M.D.   On: 04/16/2016 07:05     STUDIES:  4/15 CT angio chest >> near complete consolidation Rt lung and central Lt lung, mediastinal LAN 4/19 Bronchoscopy >> 4/21 CT abd/pelvis >>    CULTURES: 4/16 Blood >> 4/18 Sputum >> 4/19 BAL >> 4/19 BAL AFB >> smear negative >> 4/19 BAL Fungal >>  ANTIBIOTICS: 4/15 Vancomycin >> 4/15 Cefepime >>   SIGNIFICANT EVENTS: 3/21-3/28 Admit for PNA, ARDS and had Rt axillary LN bx >> Hodgkin's lymphoma; seen by oncology 4/15 Admit 4/18 VDRF due to hypoxia/hypercapnia 4/19 Oncology consulted >> started high dose steroids  LINES/TUBES: 4/18 ETT >> 4/18 Lt IJ CVL >>   DISCUSSION: 30 yo female smoker was was in hospital from 3/21 to 3/28 with PNA and dx with Hodgkin's lymphoma.  She presented 4/15 with progressive dyspnea for worsening pneumonia.  She had worsening hypoxia/hypercapnia, and required intubation.  She has hx of asthma, HTN..  ASSESSMENT / PLAN:  PULMONARY A: Acute hypoxic/hypercapnic respiratory failure 2nd to progressive pulmonary infiltrates >> ?HCAP versus progression of lymphoma. Tobacco abuse. Hx of asthma. P:   Full vent support Adjust PEEP/FiO2 to keep SpO2 > 92% F/u CXR, ABG Scheduled BDs F/u TBx from bronchoscopy on 4/19  CARDIOVASCULAR A:  Hypotension after intubation >> resolved. Hx of HTN. P:  Changed IV fluid to 30 ml/hr Hold labetalol  RENAL A:   Hyperkalemia >> likely from acidosis >> resolved. P:   F/u BMET  GASTROINTESTINAL A:   Protein calorie malnutrition. Abdominal distention 4/20 >> tolerating tube feeds. P:   Tube feeds while on vent Protonix for SUP F/u CT abd/pelvis 4/21  HEMATOLOGIC A:   Recent dx of Hodgkin's lymphoma. P:  High dose steroids started 4/19 Oncology considering start of AVD (no bleomycin given respiratory status)  INFECTIOUS A:   ?HCAP >> procalcitonin not significantly elevated. P:   Day 7 of vancomycin, cefepime   ENDOCRINE A:   Episodes of hypoglycemia >> improved. P:   Added dextrose to IV fluid 4/19 F/u CBG's  NEUROLOGIC A:   Acute metabolic encephalopathy. P:   RASS goal: -1 to -2  SUMMARY: She has progressive Rt > Lt  ASD.  She has been on several courses of broad spectrum antibiotics w/o improvement, and currently does not have significant elevation in procalcitonin.  Concern is that lung infiltrates represent her lymphoma rather than infection.   CC time 33 minutes.  Chesley Mires, MD [facility] Pulmonary/Critical Care 04/16/2016, 9:18 AM Pager:  [phone] After 3pm call: [phone]

## 2016-04-16 NOTE — Progress Notes (Signed)
Date:  April 16, 2016 Chart reviewed for concurrent status and case management needs. Will continue to follow patient for changes and needs: on full vent support and FI02 at 60% Velva Harman, BSN, Therapist, sports, [place]

## 2016-04-16 NOTE — Progress Notes (Addendum)
Pharmacy Antibiotic Note  Heidi [name] is a 30 y.o. female with recent diagnosis of Hodgkin's lymphoma and recently discharged from the hospital on 3/28,  admitted on 04/10/2016 with pneumonia. Patient's intubated and on the vent with plan for possible starting chemo therapy for lymphoma on 4/21 or 4/22.  Patient is currently on cefepime and vancomycin day #7 for PNA.  - 4/16 CXR: extensive multi focal PNA. 4/20 CXR: no change. 4/21 CXR: near total opacification of R hemi thorax consistent wit PNA and pleural effusion, increasing opacity on L consistent with progressive PNA - Tm 99.1, WBC up 16.6, ANC 8.5 (4/19), SCr stable (CrCl 96) - PCT 0.50 (4/21)   Plan: - continue vancomycin 1250 mg IV q8h.  Will check vancomycin trough level today to assess current regimen. - continue cefepime 2 gm IV q8h - Continue to monitor renal function, cultures, clinical course.  Adden: vancomycin level now back therapeutic at 16 (goal 15-20. Continue vancomycin 1250 mg IV q8h.  ___________________  Height: 5\' 6"  (167.6 cm) Weight: 162 lb 0.6 oz (73.5 kg) IBW/kg (Calculated) : 59.3  Temp (24hrs), Avg:98.6 F (37 C), Min:97.9 F (36.6 C), Max:99.1 F (37.3 C)   Recent Labs Lab 04/10/16 1953  04/12/16 0316 04/12/16 1533 04/13/16 0306 04/13/16 0840 04/14/16 0600 04/14/16 0900 04/14/16 2100 04/15/16 0446 04/16/16 0350 04/16/16 0518  WBC  --   < > 10.2  --  10.3  --  9.1 10.0  --   --   --  16.6*  CREATININE  --   < > 0.46  --  0.47  --  0.50  --   --  0.45 0.46  --   LATICACIDVEN 1.50  --   --   --   --  1.3  --   --   --   --   --   --   VANCOTROUGH  --   --   --  8*  --   --   --   --  12  --   --   --   < > = values in this interval not displayed.  Estimated Creatinine Clearance: 105.5 mL/min (by C-G formula based on Cr of 0.46).    No Known Allergies  Antimicrobials this admission: 4/15 Vancomycin  >> 4/15 Cefepime  >>   Dose adjustments this admission: 4/17: changed  cefepime to 2gm IV q8h for HAP 4/17 1533 VT = 8 mcg/ml on 500 mg q8h (~ 6.75h level), chg to 1g q8h from 500mg  q8 4/19 2100 VT = 12 (on 1g q8h) --> increase to 1250 mg IV q8h 4/21 1330 VT =  16  (1250 mg IV q8h)  Microbiology results: 4/16 MRSA PCR (+) on chlx and bactroban 4/16 bcx x2: NGTD 4/16 strep pneu (-) 4/18 endo trach:  4/19 Bronch Fungus: 4/19 Bronch AFB: 4/19 BAL: no organisms on gram stain, Cxt pending   Thank you for allowing pharmacy to be a part of this patient's care.   Dia Sitter, PharmD, BCPS 04/16/2016 11:39 AM

## 2016-04-16 NOTE — Progress Notes (Signed)
Initial Nutrition Assessment  INTERVENTION:     Increase Vital High Protein formula to goal rate of 55 ml/hr   Above TF regimen + current Propofol infusion provides 1811 kcals, 115 gm protein, 1071 ml of free water daily  NUTRITION DIAGNOSIS:   Inadequate oral intake related to inability to eat as evidenced by NPO status, ongoing  GOAL:   Patient will meet greater than or equal to 90% of their needs, met  MONITOR:   Vent status, Labs, Weight trends, TF tolerance, I & O's  ASSESSMENT:   30 y.o. female with a recent diagnosis of Lymphoma and hospitalization for Pneumonia and ARDS on 03/21-03/28 who presents to the ED with complaints of worsening SOB along with cough and Fevers and chills for the past 2 weeks.  Patient is currently intubated on ventilator support MV: 10.8 L/min Temp (24hrs), Avg:98.6 F (37 C), Min:97.9 F (36.6 C), Max:99.1 F (37.3 C)   Propofol: 18.6 ml/hr ----> 491 fat kcals  Vital High Protein formula currently infusing at goal rate of 45 ml/hr via OGT.  Tolerating well.  CCM note reviewed.   Rt lower lung tbx results 4/19 >> consistent with Hodgkin's lymphoma.   Can start chemotherapy per Oncology if CT abd/pelvis unremarkable.  Diet Order:  Diet NPO time specified  Skin:  Wound (see comment) (closed arm incision)  Last BM:  4/17  Height:   Ht Readings from Last 1 Encounters:  04/13/16 _0  (1.676 m)    Weight:   Wt Readings from Last 1 Encounters:  04/16/16 162 lb 0.6 oz (73.5 kg)    Ideal Body Weight:  59.09 kg  BMI:  Body mass index is 26.17 kg/(m^2).  Estimated Nutritional Needs:   Kcal:  4835  Protein:  110-120 gm  Fluid:  1.7 L/day  EDUCATION NEEDS:   No education needs identified at this time   Arthur Holms, RD, LDN Pager #: [phone] After-Hours Pager #: [phone]

## 2016-04-16 NOTE — Progress Notes (Signed)
Initial chemotherapy education given to mother, Heidi [name] as patient is on vent.  Mother very receptive and asked appropriate questions.  Printed material and "Chemotherapy and You" given and reviewed.  Chemotherapy consent for Doxorubicin, Vinblastine and Dacarbazine signed by mother.

## 2016-04-16 NOTE — Progress Notes (Signed)
[name] Kitchen   HEMATOLOGY/ONCOLOGY INPATIENT PROGRESS NOTE  Date of Service: 04/15/2016 Inpatient Attending: .Chesley Mires, MD  SUBJECTIVE  Patient remains on full vent support.  requiring 40% FiO2.  Tachycardia is better today.  Abdomen is somewhat distended.  BAL Cultures thus far have not had any growth. No overt issues with Solu-Medrol at this time.  No family at bedside currently.  Try to contact her mom.  Given significant lung involvement might need to consider starting chemotherapy rather quickly.  OBJECTIVE:    PHYSICAL EXAMINATION: . Filed Vitals:   04/16/16 0200 04/16/16 0300 04/16/16 0400 04/16/16 0500  BP: 129/64 131/67  146/67  Pulse: 84 82  94  Temp:   98.2 F (36.8 C)   TempSrc:   Oral   Resp: 18 18  18   Height:      Weight:   162 lb 0.6 oz (73.5 kg)   SpO2: 90% 91%  92%   Filed Weights   04/10/16 2300 04/11/16 0550 04/16/16 0400  Weight: 137 lb 9.1 oz (62.4 kg) 136 lb 7.4 oz (61.9 kg) 162 lb 0.6 oz (73.5 kg)   .Body mass index is 26.17 kg/(m^2).   [name] Kitchen Wt Readings from Last 3 Encounters:  04/16/16 162 lb 0.6 oz (73.5 kg)  03/23/16 128 lb 8 oz (58.287 kg)  05/15/14 153 lb 11.2 oz (69.718 kg)     GENERAL:Sedated and intubated on a vent SKIN: No acute rashes OROPHARYNX: NG tube and endotracheal tube in situ NECK: supple, no JVD LYMPH:  Small cervical and axillary lymphadenopathy  LUNGS: Bilateral coarse breath sounds  HEART: regular rate & rhythm, tachycardic ABDOMEN: abdomen mildly distended, normoactive bowel sounds NEURO: Sedated and intubated, unable to perform detailed neurological evaluation.  MEDICAL HISTORY:  Past Medical History  Diagnosis Date  . Asthma   . Hodgkin lymphoma ([place])   . Hypertension     SURGICAL HISTORY: Past Surgical History  Procedure Laterality Date  . Axillary lymph node biopsy Right 03/19/2016    Procedure: AXILLARY LYMPH NODE BIOPSY;  Surgeon: Armandina Gemma, MD;  Location: [facility];  Service: General;  Laterality: Right;     SOCIAL HISTORY: Social History   Social History  . Marital Status: Single    Spouse Name: N/A  . Number of Children: N/A  . Years of Education: N/A   Occupational History  . Not on file.   Social History Main Topics  . Smoking status: Current Every Day Smoker -- 0.50 packs/day    Types: Cigarettes  . Smokeless tobacco: Not on file  . Alcohol Use: Yes     Comment: PT drinks 2 40 oz beer per day   . Drug Use: Not on file  . Sexual Activity: Not on file   Other Topics Concern  . Not on file   Social History Narrative    FAMILY HISTORY: No family history on file.  ALLERGIES:  has No Known Allergies.  MEDICATIONS:  Scheduled Meds: . antiseptic oral rinse  7 mL Mouth Rinse 10 times per day  . ceFEPime (MAXIPIME) IV  2 g Intravenous 3 times per day  . chlorhexidine gluconate (SAGE KIT)  15 mL Mouth Rinse BID  . enoxaparin (LOVENOX) injection  40 mg Subcutaneous QHS  . insulin aspart  0-15 Units Subcutaneous 6 times per day  . methylPREDNISolone (SOLU-MEDROL) injection  250 mg Intravenous Q6H  . pantoprazole sodium  40 mg Per Tube Q24H  . vancomycin  1,250 mg Intravenous Q8H   Continuous Infusions: . dextrose 5 %  and 0.9% NaCl 30 mL/hr at 04/16/16 0500  . feeding supplement (VITAL HIGH PROTEIN) 1,000 mL (04/16/16 0500)  . fentaNYL infusion INTRAVENOUS 250 mcg/hr (04/16/16 0500)  . propofol (DIPRIVAN) infusion 50 mcg/kg/min (04/16/16 0500)   PRN Meds:.acetaminophen (TYLENOL) oral liquid 160 mg/5 mL, diphenhydrAMINE, fentaNYL, levalbuterol, midazolam, [DISCONTINUED] ondansetron **OR** ondansetron (ZOFRAN) IV  REVIEW OF SYSTEMS:    10 Point review of Systems was done is negative except as noted above.   LABORATORY DATA:  I have reviewed the data as listed  . CBC Latest Ref Rng 04/16/2016 04/14/2016 04/14/2016  WBC 4.0 - 10.5 K/uL 16.6(H) 10.0 9.1  Hemoglobin 12.0 - 15.0 g/dL 9.6(L) 11.0(L) 10.2(L)  Hematocrit 36.0 - 46.0 % 30.5(L) 34.6(L) 32.4(L)  Platelets 150  - 400 K/uL 512(H) 518(H) 486(H)    . CMP Latest Ref Rng 04/16/2016 04/15/2016 04/14/2016  Glucose 65 - 99 mg/dL 144(H) 151(H) 115(H)  BUN 6 - 20 mg/dL 14 8 6   Creatinine 0.44 - 1.00 mg/dL 0.46 0.45 0.50  Sodium 135 - 145 mmol/L 143 139 136  Potassium 3.5 - 5.1 mmol/L 5.0 4.8 4.5  Chloride 101 - 111 mmol/L 99(L) 97(L) 100(L)  CO2 22 - 32 mmol/L 35(H) 34(H) 33(H)  Calcium 8.9 - 10.3 mg/dL 8.4(L) 8.2(L) 7.7(L)  Total Protein 6.5 - 8.1 g/dL - - 3.8(L)  Total Bilirubin 0.3 - 1.2 mg/dL - - 0.1(L)  Alkaline Phos 38 - 126 U/L - - 65  AST 15 - 41 U/L - - 10(L)  ALT 14 - 54 U/L - - 8(L)    Microscopic Comment LYMPHOMA Histologic type: Classical Hodgkin lymphoma, mixed cellularity type Grade (if applicable): N/A Flow cytometry: No monoclonal B cell population or abnormal T cell phenotype identified ([id number]). Immunohistochemical stains: LCA, cytokeratin AE1/AE3, S100, CD15 CD30, CD163, CD20, PAX-5, CD21, CD3, CD68, CD1a, CD43, CD4, CD5, CD8, ALK protein, CD45RO, CD79a, CD10, kappa and lambda with appropriate controls. Touch preps/imprints: Scanty but with scattered large atypical mononuclear and multilobated lymphoid appearing cells. Comments: The sections show diffuse effacement of the architecture by an atypical lymphohistiocytic process characterized associated with scattered foci of necrosis. In this background, scattered individual and small clusters of large atypical mononuclear and multilobated lymphoid appearing cells are seen displaying vesicular chromatin, variably prominent nucleoli and abundant amphophilic cytoplasm. Prominent sinusoidal pattern is not appreciated and areas of collagenous fibrosis are not seen. Admixed are small lymphocytes, eosinophils, and plasma cells to variable extent. To further evaluate this process, flow cytometric analysis was performed but failed to show any monoclonal B cell population or abnormal T cell phenotype. A large battery of immunohistochemical  stains were performed and show 1 of 2 FINAL for ALONNI, HEIMSOTH [phone]) Microscopic Comment(continued) that the large atypical lymphoid appearing cells are positive for CD30 and scattered cells for CD15. They are negative for CD3, CD20, LCA, PAX-5, ALK protein, CD10, CD4, CD43, CD45RO, CD5, CD8, CD79a, EMA, cytoplasmic kappa, cytoplasmic lambda. The lymphoid component in the background shows a mixture of T and B cells with predominance of T cells containing a mixture of CD4 and CD8 positive cells. The abundant histiocytic component in the background is positive for CD4, CD68 and CD163 and mostly negative for CD1a, S100 and CD21. No significant cytokeratin (AE1/AE3) positivity is identified. The overall histologic and immunophenotypic features are consistent with classical Hodgkin lymphoma which is best subclassified as mixed cellularity-type. (BNS:kh:ecj 03-24-16) Susanne Greenhouse MD Pathologist, Electronic Signature (Case signed 03/24/2016)    RADIOGRAPHIC STUDIES: I have personally reviewed the radiological images  as listed and agreed with the findings in the report. Dg Chest 1 View  04/11/2016  CLINICAL DATA:  Shortness of breath.  History of lymphoma. EXAM: CHEST 1 VIEW COMPARISON:  Chest radiograph 04/10/2016; chest CT 04/10/2016 FINDINGS: Grossly unchanged diffuse opacification of the right hemi thorax with worsening opacities involving the left mid and upper lung. No definite pleural effusion or pneumothorax. Regional skeleton is unremarkable. IMPRESSION: Interval worsening opacities left upper lung with persistent near complete opacification of the right hemi thorax, findings overall compatible with extensive multi focal pneumonia. Known mediastinal and hilar adenopathy. Electronically Signed   By: Lovey Newcomer M.D.   On: 04/11/2016 17:03   Dg Chest 2 View  04/10/2016  CLINICAL DATA:  Patient with shortness of breath for multiple weeks. Recently diagnosed with lymphoma. EXAM: CHEST   2 VIEW COMPARISON:  Chest CT 03/16/2016; chest radiograph 03/22/2016. FINDINGS: Multiple monitoring leads overlie the patient. Stable cardiac and mediastinal contours with extensive bilateral hilar and mediastinal adenopathy. Significant worsening of consolidation within the right lung. Persistent consolidation within the left mid lung with associated small nodular opacities. Possible right pleural effusion. No pneumothorax. Regional skeleton is unremarkable. IMPRESSION: Interval worsening of consolidation throughout the majority of the right lung which may represent malignancy, postobstructive pneumonia and/or effusion. Persistent hilar and mediastinal adenopathy. Unchanged scattered opacities and consolidation within the left lung. Electronically Signed   By: Lovey Newcomer M.D.   On: 04/10/2016 19:32   Ct Angio Chest Pe W/cm &/or Wo Cm  04/10/2016  CLINICAL DATA:  Acute onset of worsening shortness of breath. Bilateral leg swelling. Recently diagnosed with Hodgkin's lymphoma. Initial encounter. EXAM: CT ANGIOGRAPHY CHEST WITH CONTRAST TECHNIQUE: Multidetector CT imaging of the chest was performed using the standard protocol during bolus administration of intravenous contrast. Multiplanar CT image reconstructions and MIPs were obtained to evaluate the vascular anatomy. CONTRAST:  100 mL of Isovue 370 IV contrast COMPARISON:  CT of the chest performed 03/16/2016, and chest radiograph performed 04/10/2016 FINDINGS: There is no evidence of pulmonary embolus. There is near complete dense consolidation of the right lung, and central consolidation involving the left upper lobe, with additional scattered nodular opacities throughout the left lung. This is significantly worsened from the prior CT, reflecting worsening bilateral pneumonia. On correlation with right axillary lymph node biopsy results, the patient was recently diagnosed with classic Hodgkin's lymphoma. Hodgkin's lymphoma typically would not narrow the airway  to the extent required for postobstructive pneumonia, and is likely unrelated to the patient's presentation with diffuse bilateral pneumonia. There is no evidence of pleural effusion or pneumothorax. Extensive enlarged mediastinal nodes are seen, particularly anteriorly, measuring up to 1.8 cm in short axis. Right axillary nodes measure up to 2.4 cm in short axis. These are compatible with the patient's Hodgkin's lymphoma. A small 3.8 cm collection of fluid at the right axilla likely reflects a postoperative seroma, status post recent right hilar lymph node biopsy. No pericardial effusion is identified. The great vessels are grossly unremarkable in appearance. The visualized portions of the thyroid gland are unremarkable in appearance. The visualized portions of the liver and spleen are unremarkable. No acute osseous abnormalities are seen. Review of the MIP images confirms the above findings. IMPRESSION: 1. No evidence of pulmonary embolus. 2. Near complete dense consolidation of the right lung, and central consolidation involving the left upper lobe, with scattered nodular opacities throughout the left lung. This is significantly worsened from the prior CT, reflecting worsening bilateral pneumonia. 3. The patient was recently  diagnosed with classic Hodgkin's lymphoma for right axillary lymph node biopsy results. The Hodgkin's lymphoma is likely unrelated to the patient's diffuse bilateral pneumonia, as lymph nodes typically do not obstruct the tracheobronchial tree. Enlarged mediastinal and right axillary nodes reflect the patient's lymphoma. 4. 3.8 cm collection of fluid at the right axilla likely reflects a postoperative seroma, status post recent right hilar lymph node biopsy. These results were called by telephone at the time of interpretation on 04/10/2016 at 9:19 pm to [facility] PA, who verbally acknowledged these results. Electronically Signed   By: Garald Balding M.D.   On: 04/10/2016 21:26   Dg  Chest Port 1 View  04/15/2016  CLINICAL DATA:  Respiratory failure, evaluate endotracheal tube position EXAM: PORTABLE CHEST 1 VIEW COMPARISON:  04/14/2016 FINDINGS: Endotracheal tube unchanged with tip 5.5 cm above the carina. Orogastric and left IJ support devices stable. Extensive opacification over the right lung stable. Patchy opacification over the left lung stable. IMPRESSION: No change from prior study. Electronically Signed   By: Skipper Cliche M.D.   On: 04/15/2016 07:00   Dg Chest Port 1 View  04/14/2016  CLINICAL DATA:  30 year old female with history of right lower lobe biopsy. EXAM: PORTABLE CHEST 1 VIEW COMPARISON:  Chest x-ray 04/14/2016. FINDINGS: An endotracheal tube is in place with tip 5.8 cm above the carina. There is a left-sided internal jugular central venous catheter with tip terminating in the proximal superior vena cava. A nasogastric tube is seen extending into the stomach, however, the tip of the nasogastric tube extends below the lower margin of the image. Again noted is extensive airspace consolidation throughout the lungs bilaterally (right greater than left), with some improving aeration in the right upper lobe compared to the prior examination. Small left pleural effusion. Possible right pleural effusion, difficult to judge given the extent of right-sided airspace consolidation on this single view examination. No evidence of pulmonary edema. Heart size is upper limits of normal. Upper mediastinal contours are within normal limits. IMPRESSION: 1. Slight improvement in aeration in the right upper lobe. Otherwise, the appearance the chest is similar, again most compatible with multilobar pneumonia. 2. Support apparatus, as above. Electronically Signed   By: Vinnie Langton M.D.   On: 04/14/2016 13:48   Dg Chest Port 1 View  04/14/2016  CLINICAL DATA:  Endotracheal tube position, Hodgkin lymphoma. EXAM: PORTABLE CHEST 1 VIEW COMPARISON:  04/13/2016 and CT chest 04/10/2016.  FINDINGS: Endotracheal tube terminates approximately 6.5 cm above carina, stable. Nasogastric tube is followed into the stomach. Left IJ central line tip projects at the junction of the brachiocephalic veins. Near complete opacification of the right hemithorax with leftward shift of the heart and mediastinum, as before. Patchy airspace consolidation bilaterally. Probable small left pleural effusion. IMPRESSION: 1. Dense consolidation throughout the right lung with patchy areas of consolidation in the left lung, likely due to pneumonia. 2. Possible small left pleural effusion. Electronically Signed   By: Lorin Picket M.D.   On: 04/14/2016 07:07   Dg Chest Port 1 View  04/13/2016  CLINICAL DATA:  Status post central catheter placement. Hodgkin's lymphoma. EXAM: PORTABLE CHEST 1 VIEW COMPARISON:  Study obtained earlier in the day as well as chest CT April 10, 2016 FINDINGS: Central catheter tip is in the superior vena cava near the junction with the left innominate vein. Endotracheal tube tip is 4.4 cm above the carina. Nasogastric tube tip and side port are below the diaphragm. No pneumothorax. There is extensive consolidation with  probable superimposed effusion, causing opacification of most of the right hemithorax, stable. There are areas of consolidation in the left mid lung with slightly less overall opacity compared to earlier in the day. The heart size and pulmonary vascularity are normal. It is difficult to assess for adenopathy given the overlying areas of consolidation. No bone lesions are evident. IMPRESSION: Tube and catheter positions as described without pneumothorax. Widespread pneumonia stable on the right with slightly less overall opacity on the left compared to earlier in the day. Confluent opacity is present in the left mid lung focally. Stable cardiac silhouette. Electronically Signed   By: Lowella Grip III M.D.   On: 04/13/2016 11:20   Dg Chest Port 1 View  04/13/2016  CLINICAL DATA:   Endotracheal orogastric tube placement EXAM: PORTABLE CHEST 1 VIEW COMPARISON:  Two days ago FINDINGS: Endotracheal tube tip just below the clavicular heads. An orogastric tube reaches the stomach. Bilateral pneumonia as demonstrated on recent chest CT, nearly confluent throughout the right lung. Air bronchograms are intermittently seen. No evidence of air leak or cavitation. No visible significant pleural effusion. Normal heart size. IMPRESSION: 1. Unremarkable positioning new endotracheal or orogastric tubes. 2.  Bilateral pneumonia without progression since 2 days ago. Electronically Signed   By: Monte Fantasia M.D.   On: 04/13/2016 08:23   Dg Chest Port 1 View  03/22/2016  CLINICAL DATA:  Cough, dyspnea at rest, bilateral parenchymal consolidation, known pneumonia. EXAM: PORTABLE CHEST 1 VIEW COMPARISON:  Portable chest x-ray of March 21, 2016 FINDINGS: The left lung is well-expanded. A large left hilar mass is stable. On the right confluent alveolar opacity occupies approximately 1/3 to 1/2 of the lung volume. This appears stable and is centered in the perihilar and infrahilar regions. The interstitial markings of both lungs are mildly prominent. The cardiac silhouette is top-normal in size. The pulmonary vascularity is not clearly engorged. There is no pleural effusion or pneumothorax. The trachea is midline. The observed bony thorax exhibits no acute abnormality. IMPRESSION: Persistent bilateral parenchymal opacities not greatly changed from the earlier study. The findings are consistent with known malignancy with lymphadenopathy and postobstructive atelectatic changes especially on the right. Electronically Signed   By: David  Martinique M.D.   On: 03/22/2016 07:08   Dg Chest Port 1 View  03/21/2016  CLINICAL DATA:  30 year old female with ARDS and asthma. EXAM: PORTABLE CHEST 1 VIEW COMPARISON:  Radiograph dated 03/19/2016 FINDINGS: Single-view of the chest demonstrates a large area of airspace opacity  in the right mid lung field as well as a smaller area of opacity in the left suprahilar region. Smaller nodular ground-glass opacity noted in the left mid lung field. Overall the lung opacities appear more confluent on this study compared to the prior study. There is no pleural effusion or pneumothorax. The cardiac silhouette is within normal limits. No acute osseous pathology identified. IMPRESSION: Bilateral airspace opacities, right greater than left. Follow-up recommended. Electronically Signed   By: Anner Crete M.D.   On: 03/21/2016 05:16   Dg Chest Port 1 View  03/19/2016  CLINICAL DATA:  Shortness of breath and cough, recent bronchoscopy with biopsy EXAM: PORTABLE CHEST 1 VIEW COMPARISON:  03/19/2016 FINDINGS: Cardiac shadow is stable. An endotracheal tube and nasogastric catheter are again seen and stable. Bilateral pulmonary infiltrates are again seen right greater than left. No pneumothorax is noted following bronchoscopy. IMPRESSION: No evidence of post bronchoscopy pneumothorax. Electronically Signed   By: Inez Catalina M.D.   On: 03/19/2016 11:20  Dg Chest Port 1 View  03/19/2016  CLINICAL DATA:  Respiratory failure. EXAM: PORTABLE CHEST 1 VIEW COMPARISON:  03/17/2016.  03/17/2016. FINDINGS: Endotracheal tube and NG tube in stable position. Heart size stable. Dense bilateral pulmonary infiltrates right side greater than left again noted. Slight interim clearing. Tiny bilateral effusions. No pneumothorax. IMPRESSION: 1.  Lines and tubes in stable position. 2. Persistent dense bilateral pulmonary infiltrates, right side greater than left. Slight interim clearing from prior exam. Tiny bilateral pleural effusions. Electronically Signed   By: Marcello Moores  Register   On: 03/19/2016 07:08   Dg Chest Port 1 View  03/17/2016  CLINICAL DATA:  Evaluate tube placement. EXAM: PORTABLE CHEST 1 VIEW COMPARISON:  Portable film earlier today. FINDINGS: Endotracheal tube has been inserted in good position lying  4.4 cm above the carina. Orogastric tube has also been inserted and is coronal within the stomach. BILATERAL pulmonary opacities appear stable, worse on the RIGHT. IMPRESSION: Satisfactory endotracheal tube and orogastric tube placement. Electronically Signed   By: Staci Righter M.D.   On: 03/17/2016 11:44   Dg Chest Port 1 View  03/17/2016  CLINICAL DATA:  Acute respiratory failure. Lung consolidation. Extensive adenopathy. EXAM: PORTABLE CHEST 1 VIEW COMPARISON:  Chest x-ray and CT scan dated 03/16/2016 FINDINGS: Extensive bilateral lung consolidation/infiltrates persist. Increased density at the left lung apex is felt to represent loculated pleural fluid. Heart size and pulmonary vascularity are normal. Stable slight thoracic scoliosis. IMPRESSION: Persistent extensive bilateral lung consolidation/pulmonary infiltrates. This probably represents a combination of tumor infiltration and pneumonia. New small loculated effusion at the left apex. Electronically Signed   By: Lorriane Shire M.D.   On: 03/17/2016 11:00   Dg C-arm Bronchoscopy  04/14/2016  CLINICAL DATA:  C-ARM BRONCHOSCOPY Fluoroscopy was utilized by the requesting physician.  No radiographic interpretation.    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 She was noted to have significant constitutional symptoms including significant weight loss, fevers chills and some night sweats. HIV negative Hepatitis C and hepatitis B serologies negative. Echo with normal ejection fraction.  #2 hypoxic respiratory failure with dense right lung consolidation and left upper lobe consolidation. Could be Hodgkin's lymphoma involvement of the lung. Cannot rule out superadded pneumonia. BAL cx no growth thus far. procalcitonin not very impressive to suggest overt sepsis.  #3  ABdominal distension - per RN no increased residuals with tube feeding. ?abd Lnadenopathy v/s ileus   Plan -CT abd/pelvis to evaluate abdominal distension -follow up cultures. -Continues to be on mechanical ventilatory support and sedation at this time as per PCCM -continue solumedrol high dose at this time -continue broad-spectrum antibiotics for coverage of possible healthcare required/postobstructive pneumonia. -will discuss with patients mother/family regarding starting chemotherapy urgently given significant concern for pulmonary involvement. -Would plan to do  AVD (ABVD without bleomycin given her current lung status ) -orders placed to tentatively start in the next 1-2 days. -Ideally would have liked to get a PET/CT scan prior to starting treatment but will not be able to do one as inpatient and would not want to delay treatment on this account.  Appreciate the excellent cares by the critical care team .  I shall continue to follow the patient daily .    Sullivan Lone MD [place] [facility]IV[facility] Hematology/Oncology Physician [place]  (Office):       [phone] (Work cell):  [phone] (Fax):           [phone]

## 2016-04-17 ENCOUNTER — Inpatient Hospital Stay (HOSPITAL_COMMUNITY): Payer: Self-pay

## 2016-04-17 DIAGNOSIS — C8190 Hodgkin lymphoma, unspecified, unspecified site: Secondary | ICD-10-CM | POA: Diagnosis present

## 2016-04-17 LAB — GLUCOSE, CAPILLARY
GLUCOSE-CAPILLARY: 155 mg/dL — AB (ref 65–99)
GLUCOSE-CAPILLARY: 111 mg/dL — AB (ref 65–99)
GLUCOSE-CAPILLARY: 148 mg/dL — AB (ref 65–99)
Glucose-Capillary: 138 mg/dL — ABNORMAL HIGH (ref 65–99)
Glucose-Capillary: 139 mg/dL — ABNORMAL HIGH (ref 65–99)
Glucose-Capillary: 141 mg/dL — ABNORMAL HIGH (ref 65–99)
Glucose-Capillary: 147 mg/dL — ABNORMAL HIGH (ref 65–99)

## 2016-04-17 LAB — BASIC METABOLIC PANEL
Anion gap: 7 (ref 5–15)
Anion gap: 8 (ref 5–15)
BUN: 18 mg/dL (ref 6–20)
BUN: 20 mg/dL (ref 6–20)
CALCIUM: 8.3 mg/dL — AB (ref 8.9–10.3)
CALCIUM: 8.8 mg/dL — AB (ref 8.9–10.3)
CO2: 33 mmol/L — ABNORMAL HIGH (ref 22–32)
CO2: 35 mmol/L — AB (ref 22–32)
CREATININE: 0.48 mg/dL (ref 0.44–1.00)
CREATININE: 0.44 mg/dL (ref 0.44–1.00)
Chloride: 102 mmol/L (ref 101–111)
Chloride: 98 mmol/L — ABNORMAL LOW (ref 101–111)
GFR calc non Af Amer: >60 mL/min (ref >60)
GLUCOSE: 125 mg/dL — AB (ref 65–99)
Glucose, Bld: 139 mg/dL — ABNORMAL HIGH (ref 65–99)
Potassium: 4.7 mmol/L (ref 3.5–5.1)
Potassium: 4.5 mmol/L (ref 3.5–5.1)
SODIUM: 142 mmol/L (ref 135–145)
Sodium: 141 mmol/L (ref 135–145)

## 2016-04-17 LAB — BLOOD GAS, ARTERIAL
ACID-BASE EXCESS: 8.3 mmol/L — AB (ref 0.0–2.0)
BICARBONATE: 33.1 meq/L — AB (ref 20.0–24.0)
Drawn by: 11249
FIO2: 0.5
LHR: 22 {breaths}/min
MECHVT: 480 mL
O2 Saturation: 91.4 %
PEEP/CPAP: 8 cmH2O
Patient temperature: 99
TCO2: 31 mmol/L (ref 0–100)
pCO2 arterial: 50.1 mmHg — ABNORMAL HIGH (ref 35.0–45.0)
pH, Arterial: 7.436 (ref 7.350–7.450)
pO2, Arterial: 67.9 mmHg — ABNORMAL LOW (ref 80.0–100.0)

## 2016-04-17 LAB — CULTURE, BAL-QUANTITATIVE: CULTURE: NO GROWTH

## 2016-04-17 LAB — CBC
HCT: 27.2 % — ABNORMAL LOW (ref 36.0–46.0)
Hemoglobin: 8.6 g/dL — ABNORMAL LOW (ref 12.0–15.0)
MCH: 30 pg (ref 26.0–34.0)
MCHC: 31.6 g/dL (ref 30.0–36.0)
MCV: 94.8 fL (ref 78.0–100.0)
PLATELETS: 563 10*3/uL — AB (ref 150–400)
RBC: 2.87 MIL/uL — AB (ref 3.87–5.11)
RDW: 15.1 % (ref 11.5–15.5)
WBC: 15.3 10*3/uL — AB (ref 4.0–10.5)

## 2016-04-17 MED ORDER — FUROSEMIDE 10 MG/ML IJ SOLN
40.0000 mg | Freq: Two times a day (BID) | INTRAMUSCULAR | Status: DC
  Administered 2016-04-17 – 2016-04-21 (×10): 40 mg via INTRAVENOUS
  Filled 2016-04-17 (×11): qty 4

## 2016-04-17 NOTE — Progress Notes (Signed)
[name] Kitchen   HEMATOLOGY/ONCOLOGY INPATIENT PROGRESS NOTE  Date of Service: 04/16/2016 Inpatient Attending: .Chesley Mires, MD  SUBJECTIVE  Patient remains on full vent support.  Bronchoscopic biopsy consistent with CHL (I discussed this with Dr Thelma Barge). Nunzio Cory [employer] educational needs teacher) and I met with the patients mother in the hospital and discussed in details Raygen's current condition, diagnosis, prognosis and treatmentr options. I recommended proceeding with chemotherapy immediately to give patient the best chance of recovery for her concerning findings. AVD (holding bleomycin till lung condition improves). G-CSF support given concern for superadded HCAP and high risk of infection while on vent. Informed written consent obtained from Mother and chemo-counseling done.  OBJECTIVE:    PHYSICAL EXAMINATION: . Filed Vitals:   04/17/16 0700 04/17/16 0800 04/17/16 0900 04/17/16 0917  BP: 127/67 144/69 130/96   Pulse: 57 78 115   Temp:      TempSrc:      Resp: 22 21 19    Height:      Weight:      SpO2: 94% 95% 97% 94%   Filed Weights   04/11/16 0550 04/16/16 0400 04/17/16 0300  Weight: 136 lb 7.4 oz (61.9 kg) 162 lb 0.6 oz (73.5 kg) 173 lb 8 oz (78.7 kg)   .Body mass index is 28.02 kg/(m^2).   [name] Kitchen Wt Readings from Last 3 Encounters:  04/17/16 173 lb 8 oz (78.7 kg)  03/23/16 128 lb 8 oz (58.287 kg)  05/15/14 153 lb 11.2 oz (69.718 kg)     GENERAL:Sedated and intubated on a vent SKIN: No acute rashes OROPHARYNX: NG tube and endotracheal tube in situ NECK: supple, no JVD LYMPH:  Small cervical and axillary lymphadenopathy  LUNGS: Bilateral coarse breath sounds  HEART: regular rate & rhythm, tachycardic ABDOMEN: abdomen mildly distended, normoactive bowel sounds NEURO: Sedated and intubated, unable to perform detailed neurological evaluation.  MEDICAL HISTORY:  Past Medical History  Diagnosis Date  . Asthma   . Hodgkin lymphoma ([place])   . Hypertension     SURGICAL  HISTORY: Past Surgical History  Procedure Laterality Date  . Axillary lymph node biopsy Right 03/19/2016    Procedure: AXILLARY LYMPH NODE BIOPSY;  Surgeon: Armandina Gemma, MD;  Location: [facility];  Service: General;  Laterality: Right;    SOCIAL HISTORY: Social History   Social History  . Marital Status: Single    Spouse Name: N/A  . Number of Children: N/A  . Years of Education: N/A   Occupational History  . Not on file.   Social History Main Topics  . Smoking status: Current Every Day Smoker -- 0.50 packs/day    Types: Cigarettes  . Smokeless tobacco: Not on file  . Alcohol Use: Yes     Comment: PT drinks 2 40 oz beer per day   . Drug Use: Not on file  . Sexual Activity: Not on file   Other Topics Concern  . Not on file   Social History Narrative    FAMILY HISTORY: No family history on file.  ALLERGIES:  has No Known Allergies.  MEDICATIONS:  Scheduled Meds: . antiseptic oral rinse  7 mL Mouth Rinse 10 times per day  . ceFEPime (MAXIPIME) IV  2 g Intravenous 3 times per day  . chlorhexidine gluconate (SAGE KIT)  15 mL Mouth Rinse BID  . dexamethasone  4 mg Intravenous BID  . enoxaparin (LOVENOX) injection  40 mg Subcutaneous QHS  . furosemide  40 mg Intravenous Q12H  . insulin aspart  0-15 Units Subcutaneous 6 times  per day  . pantoprazole sodium  40 mg Per Tube Q24H  . [START ON 04/18/2016] Tbo-Filgrastim  480 mcg Subcutaneous q1800  . vancomycin  1,250 mg Intravenous Q8H   Continuous Infusions: . dextrose 5 % and 0.9% NaCl 30 mL/hr at 04/17/16 0600  . feeding supplement (VITAL HIGH PROTEIN) 1,000 mL (04/17/16 0600)  . fentaNYL infusion INTRAVENOUS 125 mcg/hr (04/17/16 0850)  . propofol (DIPRIVAN) infusion Stopped (04/17/16 0931)   PRN Meds:.acetaminophen (TYLENOL) oral liquid 160 mg/5 mL, alteplase, Cold Pack, diphenhydrAMINE, fentaNYL, heparin lock flush, heparin lock flush, levalbuterol, midazolam, [DISCONTINUED] ondansetron **OR** ondansetron (ZOFRAN) IV,  sodium chloride flush, sodium chloride flush  REVIEW OF SYSTEMS:    10 Point review of Systems was done is negative except as noted above.   LABORATORY DATA:  I have reviewed the data as listed  . CBC Latest Ref Rng 04/16/2016 04/14/2016  WBC 4.0 - 10.5 K/uL 16.6(H) 10.0  Hemoglobin 12.0 - 15.0 g/dL 9.6(L) 11.0(L)  Hematocrit 36.0 - 46.0 % 30.5(L) 34.6(L)  Platelets 150 - 400 K/uL 512(H) 518(H)    . CMP Latest Ref Rng 04/16/2016 04/15/2016  Glucose 65 - 99 mg/dL 144(H) 151(H)  BUN 6 - 20 mg/dL 14 8  Creatinine 0.44 - 1.00 mg/dL 0.46 0.45  Sodium 135 - 145 mmol/L 143 139  Potassium 3.5 - 5.1 mmol/L 5.0 4.8  Chloride 101 - 111 mmol/L 99(L) 97(L)  CO2 22 - 32 mmol/L 35(H) 34(H)  Calcium 8.9 - 10.3 mg/dL 8.4(L) 8.2(L)  Total Protein 6.5 - 8.1 g/dL - -  Total Bilirubin 0.3 - 1.2 mg/dL - -  Alkaline Phos 38 - 126 U/L - -  AST 15 - 41 U/L - -  ALT 14 - 54 U/L - -    Microscopic Comment LYMPHOMA Histologic type: Classical Hodgkin lymphoma, mixed cellularity type Grade (if applicable): N/A Flow cytometry: No monoclonal B cell population or abnormal T cell phenotype identified ([id number]). Immunohistochemical stains: LCA, cytokeratin AE1/AE3, S100, CD15 CD30, CD163, CD20, PAX-5, CD21, CD3, CD68, CD1a, CD43, CD4, CD5, CD8, ALK protein, CD45RO, CD79a, CD10, kappa and lambda with appropriate controls. Touch preps/imprints: Scanty but with scattered large atypical mononuclear and multilobated lymphoid appearing cells. Comments: The sections show diffuse effacement of the architecture by an atypical lymphohistiocytic process characterized associated with scattered foci of necrosis. In this background, scattered individual and small clusters of large atypical mononuclear and multilobated lymphoid appearing cells are seen displaying vesicular chromatin, variably prominent nucleoli and abundant amphophilic cytoplasm. Prominent sinusoidal pattern is not appreciated and areas of collagenous  fibrosis are not seen. Admixed are small lymphocytes, eosinophils, and plasma cells to variable extent. To further evaluate this process, flow cytometric analysis was performed but failed to show any monoclonal B cell population or abnormal T cell phenotype. A large battery of immunohistochemical stains were performed and show 1 of 2 FINAL for DIONNA, WIEDEMANN [phone]) Microscopic Comment(continued) that the large atypical lymphoid appearing cells are positive for CD30 and scattered cells for CD15. They are negative for CD3, CD20, LCA, PAX-5, ALK protein, CD10, CD4, CD43, CD45RO, CD5, CD8, CD79a, EMA, cytoplasmic kappa, cytoplasmic lambda. The lymphoid component in the background shows a mixture of T and B cells with predominance of T cells containing a mixture of CD4 and CD8 positive cells. The abundant histiocytic component in the background is positive for CD4, CD68 and CD163 and mostly negative for CD1a, S100 and CD21. No significant cytokeratin (AE1/AE3) positivity is identified. The overall histologic and immunophenotypic features are consistent  with classical Hodgkin lymphoma which is best subclassified as mixed cellularity-type. (BNS:kh:ecj 03-24-16) Susanne Greenhouse MD Pathologist, Electronic Signature (Case signed 03/24/2016)    RADIOGRAPHIC STUDIES: I have personally reviewed the radiological images as listed and agreed with the findings in the report. Ct Abdomen Pelvis Wo Contrast  04/16/2016  CLINICAL DATA:  Lymphoma, respiratory failure, anasarca. EXAM: CT ABDOMEN AND PELVIS WITHOUT CONTRAST TECHNIQUE: Multidetector CT imaging of the abdomen and pelvis was performed following the standard protocol without IV contrast. COMPARISON:  Chest CT 04/10/2016. FINDINGS: Lower chest: Persistent bilateral pleural effusions, bilateral infiltrates and pulmonary nodules. Hepatobiliary: No obvious hepatic lesions but study is limited without IV contrast. The gallbladder is grossly normal.  Pancreas: No obvious mass, inflammation or ductal dilatation. Spleen: Normal size.  No obvious lesions. Adrenals/Urinary Tract: The adrenal glands and kidneys are grossly normal. Stomach/Bowel: There is an NG tube in the stomach. The duodenum, small bowel and colon are grossly normal. Vascular/Lymphatic: Retroperitoneal lymphadenopathy. 2 cm retroperitoneal lymph node on image number 42. The aorta is normal in caliber. No atherosclerotic calcifications. Reproductive: The uterus and ovaries are grossly normal. Other: The bladder contains a Foley catheter. There is a moderate amount of free pelvic fluid. No inguinal adenopathy. Diffuse body wall edema suggesting anasarca. Musculoskeletal: No significant bony findings. IMPRESSION: 1. Limited examination due to lack of IV contrast, artifact from the right arm and motion. 2. Persistent effusions, infiltrates and pulmonary nodules. 3. Diffuse body wall edema suggesting anasarca. 4. Mesenteric and retroperitoneal lymphadenopathy. 5. Moderate free pelvic fluid. Electronically Signed   By: Marijo Sanes M.D.   On: 04/16/2016 11:37   Dg Chest 1 View  04/11/2016  CLINICAL DATA:  Shortness of breath.  History of lymphoma. EXAM: CHEST 1 VIEW COMPARISON:  Chest radiograph 04/10/2016; chest CT 04/10/2016 FINDINGS: Grossly unchanged diffuse opacification of the right hemi thorax with worsening opacities involving the left mid and upper lung. No definite pleural effusion or pneumothorax. Regional skeleton is unremarkable. IMPRESSION: Interval worsening opacities left upper lung with persistent near complete opacification of the right hemi thorax, findings overall compatible with extensive multi focal pneumonia. Known mediastinal and hilar adenopathy. Electronically Signed   By: Lovey Newcomer M.D.   On: 04/11/2016 17:03   Dg Chest 2 View  04/10/2016  CLINICAL DATA:  Patient with shortness of breath for multiple weeks. Recently diagnosed with lymphoma. EXAM: CHEST  2 VIEW  COMPARISON:  Chest CT 03/16/2016; chest radiograph 03/22/2016. FINDINGS: Multiple monitoring leads overlie the patient. Stable cardiac and mediastinal contours with extensive bilateral hilar and mediastinal adenopathy. Significant worsening of consolidation within the right lung. Persistent consolidation within the left mid lung with associated small nodular opacities. Possible right pleural effusion. No pneumothorax. Regional skeleton is unremarkable. IMPRESSION: Interval worsening of consolidation throughout the majority of the right lung which may represent malignancy, postobstructive pneumonia and/or effusion. Persistent hilar and mediastinal adenopathy. Unchanged scattered opacities and consolidation within the left lung. Electronically Signed   By: Lovey Newcomer M.D.   On: 04/10/2016 19:32   Ct Angio Chest Pe W/cm &/or Wo Cm  04/10/2016  CLINICAL DATA:  Acute onset of worsening shortness of breath. Bilateral leg swelling. Recently diagnosed with Hodgkin's lymphoma. Initial encounter. EXAM: CT ANGIOGRAPHY CHEST WITH CONTRAST TECHNIQUE: Multidetector CT imaging of the chest was performed using the standard protocol during bolus administration of intravenous contrast. Multiplanar CT image reconstructions and MIPs were obtained to evaluate the vascular anatomy. CONTRAST:  100 mL of Isovue 370 IV contrast COMPARISON:  CT of  the chest performed 03/16/2016, and chest radiograph performed 04/10/2016 FINDINGS: There is no evidence of pulmonary embolus. There is near complete dense consolidation of the right lung, and central consolidation involving the left upper lobe, with additional scattered nodular opacities throughout the left lung. This is significantly worsened from the prior CT, reflecting worsening bilateral pneumonia. On correlation with right axillary lymph node biopsy results, the patient was recently diagnosed with classic Hodgkin's lymphoma. Hodgkin's lymphoma typically would not narrow the airway to the  extent required for postobstructive pneumonia, and is likely unrelated to the patient's presentation with diffuse bilateral pneumonia. There is no evidence of pleural effusion or pneumothorax. Extensive enlarged mediastinal nodes are seen, particularly anteriorly, measuring up to 1.8 cm in short axis. Right axillary nodes measure up to 2.4 cm in short axis. These are compatible with the patient's Hodgkin's lymphoma. A small 3.8 cm collection of fluid at the right axilla likely reflects a postoperative seroma, status post recent right hilar lymph node biopsy. No pericardial effusion is identified. The great vessels are grossly unremarkable in appearance. The visualized portions of the thyroid gland are unremarkable in appearance. The visualized portions of the liver and spleen are unremarkable. No acute osseous abnormalities are seen. Review of the MIP images confirms the above findings. IMPRESSION: 1. No evidence of pulmonary embolus. 2. Near complete dense consolidation of the right lung, and central consolidation involving the left upper lobe, with scattered nodular opacities throughout the left lung. This is significantly worsened from the prior CT, reflecting worsening bilateral pneumonia. 3. The patient was recently diagnosed with classic Hodgkin's lymphoma for right axillary lymph node biopsy results. The Hodgkin's lymphoma is likely unrelated to the patient's diffuse bilateral pneumonia, as lymph nodes typically do not obstruct the tracheobronchial tree. Enlarged mediastinal and right axillary nodes reflect the patient's lymphoma. 4. 3.8 cm collection of fluid at the right axilla likely reflects a postoperative seroma, status post recent right hilar lymph node biopsy. These results were called by telephone at the time of interpretation on 04/10/2016 at 9:19 pm to [facility] PA, who verbally acknowledged these results. Electronically Signed   By: Garald Balding M.D.   On: 04/10/2016 21:26   Dg Chest  Port 1 View  04/17/2016  CLINICAL DATA:  Respiratory failure. Hx asthma, Hodgkin lymphoma, htn EXAM: PORTABLE CHEST 1 VIEW COMPARISON:  04/16/2016 FINDINGS: Endotracheal tube is in place, tip 5.1 cm above the carina. Nasogastric tube is in place with tip off the film beyond the gastroesophageal junction. Left IJ central line tip overlies the level of the superior vena cava. There are dense opacities in the lungs bilaterally, right greater than left. Bilateral pleural effusions are present. IMPRESSION: 1. No significant change in the appearance of lines and tubes. 2. Stable appearance of significant bilateral infiltrates, right greater than left. Electronically Signed   By: Nolon Nations M.D.   On: 04/17/2016 07:38   Dg Chest Port 1 View  04/16/2016  CLINICAL DATA:  Respiratory failure healthcare associated pneumonia intubated patient EXAM: PORTABLE CHEST 1 VIEW COMPARISON:  Portable chest x-ray of April 15, 2016 FINDINGS: The right hemi thorax remains largely obscured with only a small amount of aerated upper lobe visible. The left lung is better aerated but increased density has developed in the retrocardiac region and there is further obscuration of the left hemidiaphragm. A small portion of the left heart border is visible. The pulmonary vascularity is indistinct. The endotracheal tube tip lies 6.4 cm above the carina. The esophagogastric tube  tip projects below the inferior margin of the image. The left internal jugular venous catheter tip projects over the proximal SVC. IMPRESSION: Near total opacification of the right hemi thorax consistent with pneumonia and pleural effusion. Increasing opacity on the left consistent with progressive pneumonia. No significant left-sided pleural effusion is visible but is likely obscured. Electronically Signed   By: David  Martinique M.D.   On: 04/16/2016 07:05   Dg Chest Port 1 View  04/15/2016  CLINICAL DATA:  Respiratory failure, evaluate endotracheal tube position  EXAM: PORTABLE CHEST 1 VIEW COMPARISON:  04/14/2016 FINDINGS: Endotracheal tube unchanged with tip 5.5 cm above the carina. Orogastric and left IJ support devices stable. Extensive opacification over the right lung stable. Patchy opacification over the left lung stable. IMPRESSION: No change from prior study. Electronically Signed   By: Skipper Cliche M.D.   On: 04/15/2016 07:00   Dg Chest Port 1 View  04/14/2016  CLINICAL DATA:  30 year old female with history of right lower lobe biopsy. EXAM: PORTABLE CHEST 1 VIEW COMPARISON:  Chest x-ray 04/14/2016. FINDINGS: An endotracheal tube is in place with tip 5.8 cm above the carina. There is a left-sided internal jugular central venous catheter with tip terminating in the proximal superior vena cava. A nasogastric tube is seen extending into the stomach, however, the tip of the nasogastric tube extends below the lower margin of the image. Again noted is extensive airspace consolidation throughout the lungs bilaterally (right greater than left), with some improving aeration in the right upper lobe compared to the prior examination. Small left pleural effusion. Possible right pleural effusion, difficult to judge given the extent of right-sided airspace consolidation on this single view examination. No evidence of pulmonary edema. Heart size is upper limits of normal. Upper mediastinal contours are within normal limits. IMPRESSION: 1. Slight improvement in aeration in the right upper lobe. Otherwise, the appearance the chest is similar, again most compatible with multilobar pneumonia. 2. Support apparatus, as above. Electronically Signed   By: Vinnie Langton M.D.   On: 04/14/2016 13:48   Dg Chest Port 1 View  04/14/2016  CLINICAL DATA:  Endotracheal tube position, Hodgkin lymphoma. EXAM: PORTABLE CHEST 1 VIEW COMPARISON:  04/13/2016 and CT chest 04/10/2016. FINDINGS: Endotracheal tube terminates approximately 6.5 cm above carina, stable. Nasogastric tube is  followed into the stomach. Left IJ central line tip projects at the junction of the brachiocephalic veins. Near complete opacification of the right hemithorax with leftward shift of the heart and mediastinum, as before. Patchy airspace consolidation bilaterally. Probable small left pleural effusion. IMPRESSION: 1. Dense consolidation throughout the right lung with patchy areas of consolidation in the left lung, likely due to pneumonia. 2. Possible small left pleural effusion. Electronically Signed   By: Lorin Picket M.D.   On: 04/14/2016 07:07   Dg Chest Port 1 View  04/13/2016  CLINICAL DATA:  Status post central catheter placement. Hodgkin's lymphoma. EXAM: PORTABLE CHEST 1 VIEW COMPARISON:  Study obtained earlier in the day as well as chest CT April 10, 2016 FINDINGS: Central catheter tip is in the superior vena cava near the junction with the left innominate vein. Endotracheal tube tip is 4.4 cm above the carina. Nasogastric tube tip and side port are below the diaphragm. No pneumothorax. There is extensive consolidation with probable superimposed effusion, causing opacification of most of the right hemithorax, stable. There are areas of consolidation in the left mid lung with slightly less overall opacity compared to earlier in the day. The heart size  and pulmonary vascularity are normal. It is difficult to assess for adenopathy given the overlying areas of consolidation. No bone lesions are evident. IMPRESSION: Tube and catheter positions as described without pneumothorax. Widespread pneumonia stable on the right with slightly less overall opacity on the left compared to earlier in the day. Confluent opacity is present in the left mid lung focally. Stable cardiac silhouette. Electronically Signed   By: Lowella Grip III M.D.   On: 04/13/2016 11:20   Dg Chest Port 1 View  04/13/2016  CLINICAL DATA:  Endotracheal orogastric tube placement EXAM: PORTABLE CHEST 1 VIEW COMPARISON:  Two days ago  FINDINGS: Endotracheal tube tip just below the clavicular heads. An orogastric tube reaches the stomach. Bilateral pneumonia as demonstrated on recent chest CT, nearly confluent throughout the right lung. Air bronchograms are intermittently seen. No evidence of air leak or cavitation. No visible significant pleural effusion. Normal heart size. IMPRESSION: 1. Unremarkable positioning new endotracheal or orogastric tubes. 2.  Bilateral pneumonia without progression since 2 days ago. Electronically Signed   By: Monte Fantasia M.D.   On: 04/13/2016 08:23   Dg Chest Port 1 View  03/22/2016  CLINICAL DATA:  Cough, dyspnea at rest, bilateral parenchymal consolidation, known pneumonia. EXAM: PORTABLE CHEST 1 VIEW COMPARISON:  Portable chest x-ray of March 21, 2016 FINDINGS: The left lung is well-expanded. A large left hilar mass is stable. On the right confluent alveolar opacity occupies approximately 1/3 to 1/2 of the lung volume. This appears stable and is centered in the perihilar and infrahilar regions. The interstitial markings of both lungs are mildly prominent. The cardiac silhouette is top-normal in size. The pulmonary vascularity is not clearly engorged. There is no pleural effusion or pneumothorax. The trachea is midline. The observed bony thorax exhibits no acute abnormality. IMPRESSION: Persistent bilateral parenchymal opacities not greatly changed from the earlier study. The findings are consistent with known malignancy with lymphadenopathy and postobstructive atelectatic changes especially on the right. Electronically Signed   By: David  Martinique M.D.   On: 03/22/2016 07:08   Dg Chest Port 1 View  03/21/2016  CLINICAL DATA:  30 year old female with ARDS and asthma. EXAM: PORTABLE CHEST 1 VIEW COMPARISON:  Radiograph dated 03/19/2016 FINDINGS: Single-view of the chest demonstrates a large area of airspace opacity in the right mid lung field as well as a smaller area of opacity in the left suprahilar  region. Smaller nodular ground-glass opacity noted in the left mid lung field. Overall the lung opacities appear more confluent on this study compared to the prior study. There is no pleural effusion or pneumothorax. The cardiac silhouette is within normal limits. No acute osseous pathology identified. IMPRESSION: Bilateral airspace opacities, right greater than left. Follow-up recommended. Electronically Signed   By: Anner Crete M.D.   On: 03/21/2016 05:16   Dg Chest Port 1 View  03/19/2016  CLINICAL DATA:  Shortness of breath and cough, recent bronchoscopy with biopsy EXAM: PORTABLE CHEST 1 VIEW COMPARISON:  03/19/2016 FINDINGS: Cardiac shadow is stable. An endotracheal tube and nasogastric catheter are again seen and stable. Bilateral pulmonary infiltrates are again seen right greater than left. No pneumothorax is noted following bronchoscopy. IMPRESSION: No evidence of post bronchoscopy pneumothorax. Electronically Signed   By: Inez Catalina M.D.   On: 03/19/2016 11:20   Dg Chest Port 1 View  03/19/2016  CLINICAL DATA:  Respiratory failure. EXAM: PORTABLE CHEST 1 VIEW COMPARISON:  03/17/2016.  03/17/2016. FINDINGS: Endotracheal tube and NG tube in stable position. Heart size  stable. Dense bilateral pulmonary infiltrates right side greater than left again noted. Slight interim clearing. Tiny bilateral effusions. No pneumothorax. IMPRESSION: 1.  Lines and tubes in stable position. 2. Persistent dense bilateral pulmonary infiltrates, right side greater than left. Slight interim clearing from prior exam. Tiny bilateral pleural effusions. Electronically Signed   By: Marcello Moores  Register   On: 03/19/2016 07:08   Dg C-arm Bronchoscopy  04/14/2016  CLINICAL DATA:  C-ARM BRONCHOSCOPY Fluoroscopy was utilized by the requesting physician.  No radiographic interpretation.    ASSESSMENT & PLAN:   30 year old African-American female with  #1 Mixed cellularity Hodgkin's lymphoma IVB with extensive  lymphadenopathy including right axillary, mediastinal and upper retroperitoneal and now iopsy proven pulmonary involvement. She was noted to have significant constitutional symptoms including significant weight loss, fevers chills and some night sweats. HIV negative Hepatitis C and hepatitis B serologies negative. Echo with normal ejection fraction.  #2 hypoxic respiratory failure with dense right lung consolidation and left upper lobe consolidation. Pulmonary biopsy +ve for CHL  Cannot rule out superadded pneumonia. BAL cx no growth thus far. procalcitonin not very impressive to suggest overt sepsis.  #3 ABdominal distension - per RN no increased residuals with tube feeding. CT abd with mesenteric and retroperitoneal LNadenopathy no other acute abdominal pathology.  Plan -informed consent for chemotherapy obtained from patients mother. -patient started on ABVD (bleomycin held for now given significant pulmonary changes) -neupogen daily starting 24hours after chemotherapy since patient is at significant risk of infection with neutropenia. -transfusion PRBC prn for Hgb<8 and PLT<20k -follow up cultures. -Continues to be on mechanical ventilatory support and sedation at this time as per PCCM -dexamethasone for 2-3 days post chemotherapy. -continue broad-spectrum antibiotics for coverage of possible healthcare required/postobstructive pneumonia.- as per pulmonology. May need continued antibiotics (with possible escalation)  till post chemotherapy neutropenia resolution.  Appreciate the excellent cares by the critical care team .  I shall continue to follow the patient daily .    Sullivan Lone MD [place] AAHIVMS [facility] Hematology/Oncology Physician [facility]  (Office):       [phone] (Work cell):  [phone] (Fax):           [phone]

## 2016-04-17 NOTE — Progress Notes (Signed)
PULMONARY / CRITICAL CARE MEDICINE   Name: Heidi [name] MRN: [id number] DOB: 1986/11/06    ADMISSION DATE:  04/10/2016 CONSULTATION DATE:  04/13/2016  REFERRING MD:  [facility]  CHIEF COMPLAINT:  Short of breath  SUBJECTIVE:  Remains on full vent support.  No issues overnight  VITAL SIGNS: BP 144/69 mmHg  Pulse 78  Temp(Src) 97.7 F (36.5 C) (Axillary)  Resp 21  Ht 5\' 6"  (1.676 m)  Wt 173 lb 8 oz (78.7 kg)  BMI 28.02 kg/m2  SpO2 95%  LMP 01/11/2016 (Approximate)  VENTILATOR SETTINGS: Vent Mode:  [-] PRVC FiO2 (%):  [50 %-100 %] 50 % Set Rate:  [22 bmp] 22 bmp Vt Set:  [480 mL] 480 mL PEEP:  [8 cmH20] 8 cmH20 Plateau Pressure:  [32 cmH20-35 cmH20] 33 cmH20  INTAKE / OUTPUT: I/O last 3 completed shifts: In: [id number] [I.V.:2496; NG/GT:2315; IV Piggyback:1516] Out: 1500 [Urine:1500]  PHYSICAL EXAMINATION: General: sedated Neuro: RASS -2, follows commands/moves extremities on WUA HEENT:  ETT in place Cardiovascular:  Regular Lungs:  Bronchial breath sounds. Bibasilar crackles.  Abdomen:  Soft, non tender Musculoskeletal:  Gr 2 edema Skin:  No rashes  LABS:  BMET  Recent Labs Lab 04/15/16 0446 04/16/16 0350 04/17/16 0350  NA 139 143 142  K 4.8 5.0 4.7  CL 97* 99* 102  CO2 34* 35* 33*  BUN 8 14 18   CREATININE 0.45 0.46 0.48  GLUCOSE 151* 144* 139*    Electrolytes  Recent Labs Lab 04/14/16 0600 04/15/16 0446 04/16/16 0350 04/17/16 0350  CALCIUM 7.7* 8.2* 8.4* 8.3*  MG 1.7  --  1.9  --   PHOS 2.0*  --  3.4  --     CBC  Recent Labs Lab 04/14/16 0900 04/16/16 0518 04/17/16 0350  WBC 10.0 16.6* 15.3*  HGB 11.0* 9.6* 8.6*  HCT 34.6* 30.5* 27.2*  PLT 518* 512* 563*    Coag's No results for input(s): APTT, INR in the last 168 hours.  Sepsis Markers  Recent Labs Lab 04/10/16 1953  04/13/16 0840 04/14/16 0600 04/15/16 0446 04/16/16 0350  LATICACIDVEN 1.50  --  1.3  --   --   --   PROCALCITON  --   < > 0.17 0.16 0.67 0.50  < > =  values in this interval not displayed.  ABG  Recent Labs Lab 04/16/16 0447 04/16/16 0507 04/17/16 0500  PHART 7.306* 7.352 7.436  PCO2ART 70.3* 62.3* 50.1*  PO2ART 60.7* 61.0* 67.9*    Liver Enzymes  Recent Labs Lab 04/10/16 1930 04/14/16 0600  AST 16 10*  ALT 15 8*  ALKPHOS 107 65  BILITOT 0.1* 0.1*  ALBUMIN 2.2* 1.5*    Cardiac Enzymes No results for input(s): TROPONINI, PROBNP in the last 168 hours.  Glucose  Recent Labs Lab 04/16/16 1158 04/16/16 1555 04/16/16 1952 04/17/16 0013 04/17/16 0351 04/17/16 0734  GLUCAP 134* 144* 144* 139* 141* 155*    Imaging Ct Abdomen Pelvis Wo Contrast  04/16/2016  CLINICAL DATA:  Lymphoma, respiratory failure, anasarca. EXAM: CT ABDOMEN AND PELVIS WITHOUT CONTRAST TECHNIQUE: Multidetector CT imaging of the abdomen and pelvis was performed following the standard protocol without IV contrast. COMPARISON:  Chest CT 04/10/2016. FINDINGS: Lower chest: Persistent bilateral pleural effusions, bilateral infiltrates and pulmonary nodules. Hepatobiliary: No obvious hepatic lesions but study is limited without IV contrast. The gallbladder is grossly normal. Pancreas: No obvious mass, inflammation or ductal dilatation. Spleen: Normal size.  No obvious lesions. Adrenals/Urinary Tract: The adrenal glands and kidneys are grossly  normal. Stomach/Bowel: There is an NG tube in the stomach. The duodenum, small bowel and colon are grossly normal. Vascular/Lymphatic: Retroperitoneal lymphadenopathy. 2 cm retroperitoneal lymph node on image number 42. The aorta is normal in caliber. No atherosclerotic calcifications. Reproductive: The uterus and ovaries are grossly normal. Other: The bladder contains a Foley catheter. There is a moderate amount of free pelvic fluid. No inguinal adenopathy. Diffuse body wall edema suggesting anasarca. Musculoskeletal: No significant bony findings. IMPRESSION: 1. Limited examination due to lack of IV contrast, artifact from  the right arm and motion. 2. Persistent effusions, infiltrates and pulmonary nodules. 3. Diffuse body wall edema suggesting anasarca. 4. Mesenteric and retroperitoneal lymphadenopathy. 5. Moderate free pelvic fluid. Electronically Signed   By: Marijo Sanes M.D.   On: 04/16/2016 11:37   Dg Chest Port 1 View  04/17/2016  CLINICAL DATA:  Respiratory failure. Hx asthma, Hodgkin lymphoma, htn EXAM: PORTABLE CHEST 1 VIEW COMPARISON:  04/16/2016 FINDINGS: Endotracheal tube is in place, tip 5.1 cm above the carina. Nasogastric tube is in place with tip off the film beyond the gastroesophageal junction. Left IJ central line tip overlies the level of the superior vena cava. There are dense opacities in the lungs bilaterally, right greater than left. Bilateral pleural effusions are present. IMPRESSION: 1. No significant change in the appearance of lines and tubes. 2. Stable appearance of significant bilateral infiltrates, right greater than left. Electronically Signed   By: Nolon Nations M.D.   On: 04/17/2016 07:38     STUDIES:  4/15 CT angio chest >> near complete consolidation Rt lung and central Lt lung, mediastinal LAN 4/19 Bronchoscopy >> 4/21 CT abd/pelvis >>   CULTURES: 4/16 Blood >> (-) 4/18 Sputum >> (-) 4/19 BAL >> (-) 4/19 BAL AFB >> smear negative >> 4/19 BAL Fungal >> 4/16 MRSA (+)  ANTIBIOTICS: 4/15 Vancomycin >> 4/15 Cefepime >>   SIGNIFICANT EVENTS: 3/21-3/28 Admit for PNA, ARDS and had Rt axillary LN bx >> Hodgkin's lymphoma; seen by oncology 4/15 Admit 4/18 VDRF due to hypoxia/hypercapnia 4/19 Oncology consulted >> started high dose steroids  LINES/TUBES: 4/18 ETT >> 4/18 Lt IJ CVL >>   DISCUSSION: 30 yo female smoker was was in hospital from 3/21 to 3/28 with PNA and dx with Hodgkin's lymphoma.  She presented 4/15 with progressive dyspnea for worsening pneumonia.  She had worsening hypoxia/hypercapnia, and required intubation.  She has hx of asthma,  HTN..  ASSESSMENT / PLAN:  PULMONARY A: Acute hypoxic/hypercapnic respiratory failure 2nd to progressive pulmonary infiltrates >> ?HCAP versus progression of lymphoma. Likely progressive lymphoma per TBx Tobacco abuse. Hx of asthma. P:   Full vent support Adjust PEEP/FiO2 to keep SpO2 > 92% F/u CXR, ABG Scheduled BDs F/u TBx from bronchoscopy on 4/19 > likely lymphoma.  Start PST  CARDIOVASCULAR A:  Hypotension after intubation >> resolved. Hx of HTN. P:  Changed IV fluid to 30 ml/hr Hold labetalol  RENAL A:   Hyperkalemia >> likely from acidosis >> resolved. Volume overload. P:   F/u BMET diuresce  GASTROINTESTINAL A:   Protein calorie malnutrition. Abdominal distention 4/20 >> tolerating tube feeds. P:   Tube feeds while on vent Protonix for SUP F/u CT abd/pelvis 4/21 > anasarca. No sig change.   HEMATOLOGIC A:   Recent dx of Hodgkin's lymphoma. P:  High dose steroids started 4/19 Ctx started by Oncology  INFECTIOUS A:   ?HCAP >> procalcitonin not significantly elevated. P:   Day 7 of vancomycin, cefepime   ENDOCRINE A:  Episodes of hypoglycemia >> improved. P:   Added dextrose to IV fluid 4/19 F/u CBG's  NEUROLOGIC A:   Acute metabolic encephalopathy. P:   RASS goal: -1 to -2  SUMMARY: She has progressive Rt > Lt ASD.  She has been on several courses of broad spectrum antibiotics w/o improvement, and currently does not have significant elevation in procalcitonin.  Concern is that lung infiltrates represent her lymphoma rather than infection.   CC time 35 minutes.  Monica Becton, MD 04/17/2016, 9:06 AM [facility] Pager [phone] After 3 pm or if no answer, call [phone]

## 2016-04-17 NOTE — Progress Notes (Signed)
Fentanyl 100 mcg given IVP by Charlie Pitter RN for  Sedation while waiting for Fentanyl drip to arrive. Taken out for pre intubation meds.

## 2016-04-18 DIAGNOSIS — J9601 Acute respiratory failure with hypoxia: Secondary | ICD-10-CM | POA: Insufficient documentation

## 2016-04-18 LAB — BLOOD GAS, ARTERIAL
Acid-Base Excess: 12.4 mmol/L — ABNORMAL HIGH (ref 0.0–2.0)
Acid-Base Excess: 12.6 mmol/L — ABNORMAL HIGH (ref 0.0–2.0)
BICARBONATE: 37.8 meq/L — AB (ref 20.0–24.0)
BICARBONATE: 39.9 meq/L — AB (ref 20.0–24.0)
Drawn by: 232811
Drawn by: 270211
FIO2: 0.5
FIO2: 0.4
LHR: 22 {breaths}/min
O2 SAT: 93.8 %
O2 Saturation: 87.2 %
PATIENT TEMPERATURE: 97.6
PCO2 ART: 53.2 mmHg — AB (ref 35.0–45.0)
PEEP/CPAP: 5 cmH2O
PEEP: 8 cmH2O
PH ART: 7.462 — AB (ref 7.350–7.450)
PO2 ART: 60.2 mmHg — AB (ref 80.0–100.0)
Patient temperature: 98.6
Pressure support: 5 cmH2O
TCO2: 35.2 mmol/L (ref 0–100)
TCO2: 37 mmol/L (ref 0–100)
VT: 480 mL
pCO2 arterial: 69.8 mmHg (ref 35.0–45.0)
pH, Arterial: 7.375 (ref 7.350–7.450)
pO2, Arterial: 68.8 mmHg — ABNORMAL LOW (ref 80.0–100.0)

## 2016-04-18 LAB — GLUCOSE, CAPILLARY
GLUCOSE-CAPILLARY: 100 mg/dL — AB (ref 65–99)
GLUCOSE-CAPILLARY: 91 mg/dL (ref 65–99)
GLUCOSE-CAPILLARY: 76 mg/dL (ref 65–99)
Glucose-Capillary: 122 mg/dL — ABNORMAL HIGH (ref 65–99)
Glucose-Capillary: 75 mg/dL (ref 65–99)

## 2016-04-18 LAB — CBC
HCT: 27.9 % — ABNORMAL LOW (ref 36.0–46.0)
Hemoglobin: 8.7 g/dL — ABNORMAL LOW (ref 12.0–15.0)
MCH: 29.9 pg (ref 26.0–34.0)
MCHC: 31.2 g/dL (ref 30.0–36.0)
MCV: 95.9 fL (ref 78.0–100.0)
PLATELETS: 494 10*3/uL — AB (ref 150–400)
RBC: 2.91 MIL/uL — AB (ref 3.87–5.11)
RDW: 14.6 % (ref 11.5–15.5)
WBC: 9.3 10*3/uL (ref 4.0–10.5)

## 2016-04-18 LAB — BASIC METABOLIC PANEL
Anion gap: 5 (ref 5–15)
BUN: 21 mg/dL — ABNORMAL HIGH (ref 6–20)
CHLORIDE: 99 mmol/L — AB (ref 101–111)
CO2: 39 mmol/L — ABNORMAL HIGH (ref 22–32)
Calcium: 8.5 mg/dL — ABNORMAL LOW (ref 8.9–10.3)
Creatinine, Ser: 0.36 mg/dL — ABNORMAL LOW (ref 0.44–1.00)
GFR calc non Af Amer: >60 mL/min (ref >60)
Glucose, Bld: 117 mg/dL — ABNORMAL HIGH (ref 65–99)
POTASSIUM: 3.6 mmol/L (ref 3.5–5.1)
SODIUM: 143 mmol/L (ref 135–145)

## 2016-04-18 MED ORDER — CETYLPYRIDINIUM CHLORIDE 0.05 % MT LIQD
7.0000 mL | Freq: Two times a day (BID) | OROMUCOSAL | Status: DC
  Administered 2016-04-19 – 2016-04-26 (×12): 7 mL via OROMUCOSAL

## 2016-04-18 MED ORDER — IPRATROPIUM-ALBUTEROL 0.5-2.5 (3) MG/3ML IN SOLN
3.0000 mL | Freq: Four times a day (QID) | RESPIRATORY_TRACT | Status: DC
  Administered 2016-04-18 – 2016-04-22 (×12): 3 mL via RESPIRATORY_TRACT
  Filled 2016-04-18 (×13): qty 3

## 2016-04-18 MED ORDER — BUDESONIDE 0.5 MG/2ML IN SUSP
0.5000 mg | Freq: Two times a day (BID) | RESPIRATORY_TRACT | Status: DC
Start: 2016-04-18 — End: 2016-04-26
  Administered 2016-04-18 – 2016-04-25 (×15): 0.5 mg via RESPIRATORY_TRACT
  Filled 2016-04-18 (×16): qty 2

## 2016-04-18 MED ORDER — ANTISEPTIC ORAL RINSE SOLUTION (CORINZ)
7.0000 mL | Freq: Four times a day (QID) | OROMUCOSAL | Status: DC
  Administered 2016-04-18: 7 mL via OROMUCOSAL

## 2016-04-18 MED ORDER — FENTANYL CITRATE (PF) 100 MCG/2ML IJ SOLN
12.5000 ug | INTRAMUSCULAR | Status: DC | PRN
Start: 2016-04-18 — End: 2016-04-26
  Administered 2016-04-23 (×4): 12.5 ug via INTRAVENOUS
  Filled 2016-04-18 (×4): qty 2

## 2016-04-18 MED ORDER — FENTANYL CITRATE (PF) 100 MCG/2ML IJ SOLN: 12.5000 ug | INTRAMUSCULAR | Status: DC | PRN

## 2016-04-18 NOTE — Progress Notes (Signed)
eLink Physician-Brief Progress Note Patient Name: Heidi [name] DOB: 02/15/1986 MRN: [id number]   Date of Service  04/18/2016  HPI/Events of Note  Hypoglycemia - Blood glucose = 75 >> 76.  eICU Interventions  Will increase D5 0.9 NaCl IV infusion to 50 mL/hour.     Intervention Category Major Interventions: Other:  Lysle Dingwall 04/18/2016, 9:42 PM

## 2016-04-18 NOTE — Progress Notes (Signed)
eLink Physician-Brief Progress Note Patient Name: Heidi [name] DOB: Jul 26, 1986 MRN: [id number]   Date of Service  04/18/2016  HPI/Events of Note  Asked to advance diet. Extubated today. I don't think that a diet is a good idea at this time. Would try sips and ice chips as tolerated.   eICU Interventions  NPO except sips of water and ice chips.      Intervention Category Minor Interventions: Routine modifications to care plan (e.g. PRN medications for pain, fever)  Sommer,Steven Eugene 04/18/2016, 4:24 PM

## 2016-04-18 NOTE — Progress Notes (Signed)
[name] Kitchen   HEMATOLOGY/ONCOLOGY INPATIENT PROGRESS NOTE  Date of Service: 04/18/2016 Inpatient Attending: .Chesley Mires, MD  SUBJECTIVE  Patient was extubated this morning. Currently on high flow oxygen. Notes significant cough and secretions. No chest pain. Notes that she is feeling quite hungry. No other acute new symptoms. Had a good bowel movement today. No nausea.  OBJECTIVE:  PHYSICAL EXAMINATION: . Filed Vitals:   04/18/16 1609 04/18/16 1618 04/18/16 1700 04/18/16 1720  BP:    166/74  Pulse:   98 81  Temp:      TempSrc:      Resp:   18 25  Height:      Weight:      SpO2: 96% 96% 95% 91%   Filed Weights   04/16/16 0400 04/17/16 0300 04/18/16 0200  Weight: 162 lb 0.6 oz (73.5 kg) 173 lb 8 oz (78.7 kg) 171 lb 11.8 oz (77.9 kg)   .Body mass index is 27.73 kg/(m^2).   [name] Kitchen Wt Readings from Last 3 Encounters:  04/18/16 171 lb 11.8 oz (77.9 kg)  03/23/16 128 lb 8 oz (58.287 kg)  05/15/14 153 lb 11.2 oz (69.718 kg)   GENERAL:Awake alert and oriented 3 SKIN: No acute rashes OROPHARYNX: Patient extubated this morning. Coated tongue. NECK: supple, no JVD LYMPH:  Small cervical and axillary lymphadenopathy  LUNGS: Bilateral coarse breath sounds with bilateral wheezes HEART: regular rate & rhythm ABDOMEN: abdomen soft, nontender, normoactive bowel sounds NEURO: Awake alert and oriented 3, moving all 4 extremities. No overt focal neurological deficit.  MEDICAL HISTORY:  Past Medical History  Diagnosis Date  . Asthma   . Hodgkin lymphoma ([place])   . Hypertension     SURGICAL HISTORY: Past Surgical History  Procedure Laterality Date  . Axillary lymph node biopsy Right 03/19/2016    Procedure: AXILLARY LYMPH NODE BIOPSY;  Surgeon: Armandina Gemma, MD;  Location: [facility];  Service: General;  Laterality: Right;    SOCIAL HISTORY: Social History   Social History  . Marital Status: Single    Spouse Name: N/A  . Number of Children: N/A  . Years of Education: N/A   Occupational  History  . Not on file.   Social History Main Topics  . Smoking status: Current Every Day Smoker -- 0.50 packs/day    Types: Cigarettes  . Smokeless tobacco: Not on file  . Alcohol Use: Yes     Comment: PT drinks 2 40 oz beer per day   . Drug Use: Not on file  . Sexual Activity: Not on file   Other Topics Concern  . Not on file   Social History Narrative    FAMILY HISTORY: No family history on file.  ALLERGIES:  has No Known Allergies.  MEDICATIONS:  Scheduled Meds: . antiseptic oral rinse  7 mL Mouth Rinse QID  . budesonide (PULMICORT) nebulizer solution  0.5 mg Nebulization BID  . ceFEPime (MAXIPIME) IV  2 g Intravenous 3 times per day  . chlorhexidine gluconate (SAGE KIT)  15 mL Mouth Rinse BID  . dexamethasone  4 mg Intravenous BID  . enoxaparin (LOVENOX) injection  40 mg Subcutaneous QHS  . furosemide  40 mg Intravenous Q12H  . insulin aspart  0-15 Units Subcutaneous 6 times per day  . ipratropium-albuterol  3 mL Nebulization QID  . pantoprazole sodium  40 mg Per Tube Q24H  . Tbo-Filgrastim  480 mcg Subcutaneous q1800  . vancomycin  1,250 mg Intravenous Q8H   Continuous Infusions: . dextrose 5 % and 0.9%  NaCl 30 mL/hr at 04/18/16 1220   PRN Meds:.acetaminophen (TYLENOL) oral liquid 160 mg/5 mL, alteplase, Cold Pack, diphenhydrAMINE, fentaNYL (SUBLIMAZE) injection, heparin lock flush, heparin lock flush, levalbuterol, midazolam, [DISCONTINUED] ondansetron **OR** ondansetron (ZOFRAN) IV, sodium chloride flush, sodium chloride flush  REVIEW OF SYSTEMS:    10 Point review of Systems was done is negative except as noted above.   LABORATORY DATA:   . CBC Latest Ref Rng 04/18/2016 04/17/2016 04/16/2016  WBC 4.0 - 10.5 K/uL 9.3 15.3(H) 16.6(H)  Hemoglobin 12.0 - 15.0 g/dL 8.7(L) 8.6(L) 9.6(L)  Hematocrit 36.0 - 46.0 % 27.9(L) 27.2(L) 30.5(L)  Platelets 150 - 400 K/uL 494(H) 563(H) 512(H)   . CMP Latest Ref Rng 04/18/2016 04/17/2016 04/17/2016  Glucose 65 - 99 mg/dL  117(H) 125(H) 139(H)  BUN 6 - 20 mg/dL 21(H) 20 18  Creatinine 0.44 - 1.00 mg/dL 0.36(L) 0.44 0.48  Sodium 135 - 145 mmol/L 143 141 142  Potassium 3.5 - 5.1 mmol/L 3.6 4.5 4.7  Chloride 101 - 111 mmol/L 99(L) 98(L) 102  CO2 22 - 32 mmol/L 39(H) 35(H) 33(H)  Calcium 8.9 - 10.3 mg/dL 8.5(L) 8.8(L) 8.3(L)  Total Protein 6.5 - 8.1 g/dL - - -  Total Bilirubin 0.3 - 1.2 mg/dL - - -  Alkaline Phos 38 - 126 U/L - - -  AST 15 - 41 U/L - - -  ALT 14 - 54 U/L - - -       Microscopic Comment LYMPHOMA Histologic type: Classical Hodgkin lymphoma, mixed cellularity type Grade (if applicable): N/A Flow cytometry: No monoclonal B cell population or abnormal T cell phenotype identified ([id number]). Immunohistochemical stains: LCA, cytokeratin AE1/AE3, S100, CD15 CD30, CD163, CD20, PAX-5, CD21, CD3, CD68, CD1a, CD43, CD4, CD5, CD8, ALK protein, CD45RO, CD79a, CD10, kappa and lambda with appropriate controls. Touch preps/imprints: Scanty but with scattered large atypical mononuclear and multilobated lymphoid appearing cells. Comments: The sections show diffuse effacement of the architecture by an atypical lymphohistiocytic process characterized associated with scattered foci of necrosis. In this background, scattered individual and small clusters of large atypical mononuclear and multilobated lymphoid appearing cells are seen displaying vesicular chromatin, variably prominent nucleoli and abundant amphophilic cytoplasm. Prominent sinusoidal pattern is not appreciated and areas of collagenous fibrosis are not seen. Admixed are small lymphocytes, eosinophils, and plasma cells to variable extent. To further evaluate this process, flow cytometric analysis was performed but failed to show any monoclonal B cell population or abnormal T cell phenotype. A large battery of immunohistochemical stains were performed and show 1 of 2 FINAL for CALLI, BASHOR [phone]) Microscopic Comment(continued) that  the large atypical lymphoid appearing cells are positive for CD30 and scattered cells for CD15. They are negative for CD3, CD20, LCA, PAX-5, ALK protein, CD10, CD4, CD43, CD45RO, CD5, CD8, CD79a, EMA, cytoplasmic kappa, cytoplasmic lambda. The lymphoid component in the background shows a mixture of T and B cells with predominance of T cells containing a mixture of CD4 and CD8 positive cells. The abundant histiocytic component in the background is positive for CD4, CD68 and CD163 and mostly negative for CD1a, S100 and CD21. No significant cytokeratin (AE1/AE3) positivity is identified. The overall histologic and immunophenotypic features are consistent with classical Hodgkin lymphoma which is best subclassified as mixed cellularity-type. (BNS:kh:ecj 03-24-16) Susanne Greenhouse MD Pathologist, Electronic Signature (Case signed 03/24/2016)    RADIOGRAPHIC STUDIES: I have personally reviewed the radiological images as listed and agreed with the findings in the report. Ct Abdomen Pelvis Wo Contrast  04/16/2016  CLINICAL  DATA:  Lymphoma, respiratory failure, anasarca. EXAM: CT ABDOMEN AND PELVIS WITHOUT CONTRAST TECHNIQUE: Multidetector CT imaging of the abdomen and pelvis was performed following the standard protocol without IV contrast. COMPARISON:  Chest CT 04/10/2016. FINDINGS: Lower chest: Persistent bilateral pleural effusions, bilateral infiltrates and pulmonary nodules. Hepatobiliary: No obvious hepatic lesions but study is limited without IV contrast. The gallbladder is grossly normal. Pancreas: No obvious mass, inflammation or ductal dilatation. Spleen: Normal size.  No obvious lesions. Adrenals/Urinary Tract: The adrenal glands and kidneys are grossly normal. Stomach/Bowel: There is an NG tube in the stomach. The duodenum, small bowel and colon are grossly normal. Vascular/Lymphatic: Retroperitoneal lymphadenopathy. 2 cm retroperitoneal lymph node on image number 42. The aorta is normal in caliber. No  atherosclerotic calcifications. Reproductive: The uterus and ovaries are grossly normal. Other: The bladder contains a Foley catheter. There is a moderate amount of free pelvic fluid. No inguinal adenopathy. Diffuse body wall edema suggesting anasarca. Musculoskeletal: No significant bony findings. IMPRESSION: 1. Limited examination due to lack of IV contrast, artifact from the right arm and motion. 2. Persistent effusions, infiltrates and pulmonary nodules. 3. Diffuse body wall edema suggesting anasarca. 4. Mesenteric and retroperitoneal lymphadenopathy. 5. Moderate free pelvic fluid. Electronically Signed   By: Marijo Sanes M.D.   On: 04/16/2016 11:37   Dg Chest 1 View  04/11/2016  CLINICAL DATA:  Shortness of breath.  History of lymphoma. EXAM: CHEST 1 VIEW COMPARISON:  Chest radiograph 04/10/2016; chest CT 04/10/2016 FINDINGS: Grossly unchanged diffuse opacification of the right hemi thorax with worsening opacities involving the left mid and upper lung. No definite pleural effusion or pneumothorax. Regional skeleton is unremarkable. IMPRESSION: Interval worsening opacities left upper lung with persistent near complete opacification of the right hemi thorax, findings overall compatible with extensive multi focal pneumonia. Known mediastinal and hilar adenopathy. Electronically Signed   By: Lovey Newcomer M.D.   On: 04/11/2016 17:03   Dg Chest 2 View  04/10/2016  CLINICAL DATA:  Patient with shortness of breath for multiple weeks. Recently diagnosed with lymphoma. EXAM: CHEST  2 VIEW COMPARISON:  Chest CT 03/16/2016; chest radiograph 03/22/2016. FINDINGS: Multiple monitoring leads overlie the patient. Stable cardiac and mediastinal contours with extensive bilateral hilar and mediastinal adenopathy. Significant worsening of consolidation within the right lung. Persistent consolidation within the left mid lung with associated small nodular opacities. Possible right pleural effusion. No pneumothorax. Regional  skeleton is unremarkable. IMPRESSION: Interval worsening of consolidation throughout the majority of the right lung which may represent malignancy, postobstructive pneumonia and/or effusion. Persistent hilar and mediastinal adenopathy. Unchanged scattered opacities and consolidation within the left lung. Electronically Signed   By: Lovey Newcomer M.D.   On: 04/10/2016 19:32   Ct Angio Chest Pe W/cm &/or Wo Cm  04/10/2016  CLINICAL DATA:  Acute onset of worsening shortness of breath. Bilateral leg swelling. Recently diagnosed with Hodgkin's lymphoma. Initial encounter. EXAM: CT ANGIOGRAPHY CHEST WITH CONTRAST TECHNIQUE: Multidetector CT imaging of the chest was performed using the standard protocol during bolus administration of intravenous contrast. Multiplanar CT image reconstructions and MIPs were obtained to evaluate the vascular anatomy. CONTRAST:  100 mL of Isovue 370 IV contrast COMPARISON:  CT of the chest performed 03/16/2016, and chest radiograph performed 04/10/2016 FINDINGS: There is no evidence of pulmonary embolus. There is near complete dense consolidation of the right lung, and central consolidation involving the left upper lobe, with additional scattered nodular opacities throughout the left lung. This is significantly worsened from the prior CT, reflecting  worsening bilateral pneumonia. On correlation with right axillary lymph node biopsy results, the patient was recently diagnosed with classic Hodgkin's lymphoma. Hodgkin's lymphoma typically would not narrow the airway to the extent required for postobstructive pneumonia, and is likely unrelated to the patient's presentation with diffuse bilateral pneumonia. There is no evidence of pleural effusion or pneumothorax. Extensive enlarged mediastinal nodes are seen, particularly anteriorly, measuring up to 1.8 cm in short axis. Right axillary nodes measure up to 2.4 cm in short axis. These are compatible with the patient's Hodgkin's lymphoma. A small  3.8 cm collection of fluid at the right axilla likely reflects a postoperative seroma, status post recent right hilar lymph node biopsy. No pericardial effusion is identified. The great vessels are grossly unremarkable in appearance. The visualized portions of the thyroid gland are unremarkable in appearance. The visualized portions of the liver and spleen are unremarkable. No acute osseous abnormalities are seen. Review of the MIP images confirms the above findings. IMPRESSION: 1. No evidence of pulmonary embolus. 2. Near complete dense consolidation of the right lung, and central consolidation involving the left upper lobe, with scattered nodular opacities throughout the left lung. This is significantly worsened from the prior CT, reflecting worsening bilateral pneumonia. 3. The patient was recently diagnosed with classic Hodgkin's lymphoma for right axillary lymph node biopsy results. The Hodgkin's lymphoma is likely unrelated to the patient's diffuse bilateral pneumonia, as lymph nodes typically do not obstruct the tracheobronchial tree. Enlarged mediastinal and right axillary nodes reflect the patient's lymphoma. 4. 3.8 cm collection of fluid at the right axilla likely reflects a postoperative seroma, status post recent right hilar lymph node biopsy. These results were called by telephone at the time of interpretation on 04/10/2016 at 9:19 pm to [facility] PA, who verbally acknowledged these results. Electronically Signed   By: Garald Balding M.D.   On: 04/10/2016 21:26   Dg Chest Port 1 View  04/17/2016  CLINICAL DATA:  Respiratory failure. Hx asthma, Hodgkin lymphoma, htn EXAM: PORTABLE CHEST 1 VIEW COMPARISON:  04/16/2016 FINDINGS: Endotracheal tube is in place, tip 5.1 cm above the carina. Nasogastric tube is in place with tip off the film beyond the gastroesophageal junction. Left IJ central line tip overlies the level of the superior vena cava. There are dense opacities in the lungs bilaterally,  right greater than left. Bilateral pleural effusions are present. IMPRESSION: 1. No significant change in the appearance of lines and tubes. 2. Stable appearance of significant bilateral infiltrates, right greater than left. Electronically Signed   By: Nolon Nations M.D.   On: 04/17/2016 07:38   Dg Chest Port 1 View  04/16/2016  CLINICAL DATA:  Respiratory failure healthcare associated pneumonia intubated patient EXAM: PORTABLE CHEST 1 VIEW COMPARISON:  Portable chest x-ray of April 15, 2016 FINDINGS: The right hemi thorax remains largely obscured with only a small amount of aerated upper lobe visible. The left lung is better aerated but increased density has developed in the retrocardiac region and there is further obscuration of the left hemidiaphragm. A small portion of the left heart border is visible. The pulmonary vascularity is indistinct. The endotracheal tube tip lies 6.4 cm above the carina. The esophagogastric tube tip projects below the inferior margin of the image. The left internal jugular venous catheter tip projects over the proximal SVC. IMPRESSION: Near total opacification of the right hemi thorax consistent with pneumonia and pleural effusion. Increasing opacity on the left consistent with progressive pneumonia. No significant left-sided pleural effusion is visible but  is likely obscured. Electronically Signed   By: David  Martinique M.D.   On: 04/16/2016 07:05   Dg Chest Port 1 View  04/15/2016  CLINICAL DATA:  Respiratory failure, evaluate endotracheal tube position EXAM: PORTABLE CHEST 1 VIEW COMPARISON:  04/14/2016 FINDINGS: Endotracheal tube unchanged with tip 5.5 cm above the carina. Orogastric and left IJ support devices stable. Extensive opacification over the right lung stable. Patchy opacification over the left lung stable. IMPRESSION: No change from prior study. Electronically Signed   By: Skipper Cliche M.D.   On: 04/15/2016 07:00   Dg Chest Port 1 View  04/14/2016  CLINICAL  DATA:  30 year old female with history of right lower lobe biopsy. EXAM: PORTABLE CHEST 1 VIEW COMPARISON:  Chest x-ray 04/14/2016. FINDINGS: An endotracheal tube is in place with tip 5.8 cm above the carina. There is a left-sided internal jugular central venous catheter with tip terminating in the proximal superior vena cava. A nasogastric tube is seen extending into the stomach, however, the tip of the nasogastric tube extends below the lower margin of the image. Again noted is extensive airspace consolidation throughout the lungs bilaterally (right greater than left), with some improving aeration in the right upper lobe compared to the prior examination. Small left pleural effusion. Possible right pleural effusion, difficult to judge given the extent of right-sided airspace consolidation on this single view examination. No evidence of pulmonary edema. Heart size is upper limits of normal. Upper mediastinal contours are within normal limits. IMPRESSION: 1. Slight improvement in aeration in the right upper lobe. Otherwise, the appearance the chest is similar, again most compatible with multilobar pneumonia. 2. Support apparatus, as above. Electronically Signed   By: Vinnie Langton M.D.   On: 04/14/2016 13:48   Dg Chest Port 1 View  04/14/2016  CLINICAL DATA:  Endotracheal tube position, Hodgkin lymphoma. EXAM: PORTABLE CHEST 1 VIEW COMPARISON:  04/13/2016 and CT chest 04/10/2016. FINDINGS: Endotracheal tube terminates approximately 6.5 cm above carina, stable. Nasogastric tube is followed into the stomach. Left IJ central line tip projects at the junction of the brachiocephalic veins. Near complete opacification of the right hemithorax with leftward shift of the heart and mediastinum, as before. Patchy airspace consolidation bilaterally. Probable small left pleural effusion. IMPRESSION: 1. Dense consolidation throughout the right lung with patchy areas of consolidation in the left lung, likely due to  pneumonia. 2. Possible small left pleural effusion. Electronically Signed   By: Lorin Picket M.D.   On: 04/14/2016 07:07   Dg Chest Port 1 View  04/13/2016  CLINICAL DATA:  Status post central catheter placement. Hodgkin's lymphoma. EXAM: PORTABLE CHEST 1 VIEW COMPARISON:  Study obtained earlier in the day as well as chest CT April 10, 2016 FINDINGS: Central catheter tip is in the superior vena cava near the junction with the left innominate vein. Endotracheal tube tip is 4.4 cm above the carina. Nasogastric tube tip and side port are below the diaphragm. No pneumothorax. There is extensive consolidation with probable superimposed effusion, causing opacification of most of the right hemithorax, stable. There are areas of consolidation in the left mid lung with slightly less overall opacity compared to earlier in the day. The heart size and pulmonary vascularity are normal. It is difficult to assess for adenopathy given the overlying areas of consolidation. No bone lesions are evident. IMPRESSION: Tube and catheter positions as described without pneumothorax. Widespread pneumonia stable on the right with slightly less overall opacity on the left compared to earlier in the day. Confluent  opacity is present in the left mid lung focally. Stable cardiac silhouette. Electronically Signed   By: Lowella Grip III M.D.   On: 04/13/2016 11:20   Dg Chest Port 1 View  04/13/2016  CLINICAL DATA:  Endotracheal orogastric tube placement EXAM: PORTABLE CHEST 1 VIEW COMPARISON:  Two days ago FINDINGS: Endotracheal tube tip just below the clavicular heads. An orogastric tube reaches the stomach. Bilateral pneumonia as demonstrated on recent chest CT, nearly confluent throughout the right lung. Air bronchograms are intermittently seen. No evidence of air leak or cavitation. No visible significant pleural effusion. Normal heart size. IMPRESSION: 1. Unremarkable positioning new endotracheal or orogastric tubes. 2.   Bilateral pneumonia without progression since 2 days ago. Electronically Signed   By: Monte Fantasia M.D.   On: 04/13/2016 08:23   Dg Chest Port 1 View  03/22/2016  CLINICAL DATA:  Cough, dyspnea at rest, bilateral parenchymal consolidation, known pneumonia. EXAM: PORTABLE CHEST 1 VIEW COMPARISON:  Portable chest x-ray of March 21, 2016 FINDINGS: The left lung is well-expanded. A large left hilar mass is stable. On the right confluent alveolar opacity occupies approximately 1/3 to 1/2 of the lung volume. This appears stable and is centered in the perihilar and infrahilar regions. The interstitial markings of both lungs are mildly prominent. The cardiac silhouette is top-normal in size. The pulmonary vascularity is not clearly engorged. There is no pleural effusion or pneumothorax. The trachea is midline. The observed bony thorax exhibits no acute abnormality. IMPRESSION: Persistent bilateral parenchymal opacities not greatly changed from the earlier study. The findings are consistent with known malignancy with lymphadenopathy and postobstructive atelectatic changes especially on the right. Electronically Signed   By: David  Martinique M.D.   On: 03/22/2016 07:08   Dg Chest Port 1 View  03/21/2016  CLINICAL DATA:  30 year old female with ARDS and asthma. EXAM: PORTABLE CHEST 1 VIEW COMPARISON:  Radiograph dated 03/19/2016 FINDINGS: Single-view of the chest demonstrates a large area of airspace opacity in the right mid lung field as well as a smaller area of opacity in the left suprahilar region. Smaller nodular ground-glass opacity noted in the left mid lung field. Overall the lung opacities appear more confluent on this study compared to the prior study. There is no pleural effusion or pneumothorax. The cardiac silhouette is within normal limits. No acute osseous pathology identified. IMPRESSION: Bilateral airspace opacities, right greater than left. Follow-up recommended. Electronically Signed   By: Anner Crete M.D.   On: 03/21/2016 05:16   Dg C-arm Bronchoscopy  04/14/2016  CLINICAL DATA:  C-ARM BRONCHOSCOPY Fluoroscopy was utilized by the requesting physician.  No radiographic interpretation.    ASSESSMENT & PLAN:   30 year old African-American female with  #1 Mixed cellularity Hodgkin's lymphoma IVB with extensive lymphadenopathy including right axillary, mediastinal and upper retroperitoneal and now biopsy proven pulmonary involvement. She was noted to have significant constitutional symptoms including significant weight loss, fevers chills and some night sweats. HIV negative Hepatitis C and hepatitis B serologies negative. Echo with normal ejection fraction.  #2 hypoxic respiratory failure with dense right lung consolidation and left upper lobe consolidation. Pulmonary biopsy +ve for CHL  Cannot rule out superadded pneumonia. Appears to have some chronic hypercapnia. BAL cx no growth thus far. procalcitonin not very impressive to suggest overt sepsis. Patient extubated this morning currently on high flow nasal cannula oxygen.  #3 ABdominal distension - per RN no increased residuals with tube feeding. CT abd with mesenteric and retroperitoneal LNadenopathy no other acute abdominal  pathology.  Plan -Patient appears to be improving and was extubated this morning. Having significant cough and secretions but able to call these up and use a suction cannula. Secretions are clear. -No fevers or chills. -Notes that she is feeling quite hungry. Likely from the steroids. On sips only today as per ICU team. We will likely need a swallowing evaluation prior to advancing her diet. -Ongoing antibiotics as per MICU team. -Started Tbo-filgrastim today to reduce the degree and duration of neutropenia from chemotherapy. -No prohibitive toxicity from chemotherapy at this time. No nausea.  -Had a detailed discussion with the patient and gave her a detailed understanding of her hospital course and  treatment plan thus far.  Appreciate the excellent cares by the critical care team . We'll continue to follow along .  Sullivan Lone MD [place] [facility] Hematology/Oncology Physician [facility]  (Office):       [phone] (Work cell):  [phone] (Fax):           [phone]

## 2016-04-18 NOTE — Procedures (Signed)
Extubation Procedure Note  Patient Details:   Name: Heidi [name] DOB: 15-Sep-1986 MRN: [id number]   Airway Documentation:  Airway 7.5 mm (Active)  Secured at (cm) 23 cm 04/18/2016  8:12 AM  Measured From Lips 04/18/2016  8:12 AM  Secured Location Right 04/18/2016  8:12 AM  Secured By [employer] 04/18/2016  8:12 AM  Tube Holder Repositioned Yes 04/18/2016  8:12 AM  Cuff Pressure (cm H2O) 22 cm H2O 04/18/2016  8:12 AM  Site Condition Dry 04/18/2016  8:12 AM    Evaluation  O2 sats: stable throughout Complications: No apparent complications Patient did tolerate procedure well. Bilateral Breath Sounds: Diminished Suctioning: Airway Yes  Johnette Abraham 04/18/2016, 9:53 AM

## 2016-04-18 NOTE — Progress Notes (Signed)
Pt was awake and watching tv when I arrived. She did not present a need for [facility] visit and said she was doing ok. I offered support for her if she needs it in the future. Please page if any level of support is needed.  Chaplain Ernest Haber, M.Div.   04/18/16 1425  Clinical Encounter Type  Visited With Patient

## 2016-04-18 NOTE — Progress Notes (Signed)
PULMONARY / CRITICAL CARE MEDICINE   Name: Heidi [name] MRN: [id number] DOB: 08/19/1986    ADMISSION DATE:  04/10/2016 CONSULTATION DATE:  04/13/2016  REFERRING MD:  Triad  CHIEF COMPLAINT:  Short of breath  SUBJECTIVE:  Did fair on PST 4/22.  Wants the ETT out.  Bland well.    VITAL SIGNS: BP 146/79 mmHg  Pulse 85  Temp(Src) 98 F (36.7 C) (Oral)  Resp 22  Ht 5\' 6"  (1.676 m)  Wt 171 lb 11.8 oz (77.9 kg)  BMI 27.73 kg/m2  SpO2 95%  LMP 01/11/2016 (Approximate)  VENTILATOR SETTINGS: Vent Mode:  [-] PSV;CPAP FiO2 (%):  [40 %-50 %] 40 % Set Rate:  [22 bmp] 22 bmp Vt Set:  [480 mL] 480 mL PEEP:  [5 cmH20-8 cmH20] 5 cmH20 Pressure Support:  [5 cmH20-10 cmH20] 5 cmH20 Plateau Pressure:  [31 cmH20-32 cmH20] 31 cmH20  INTAKE / OUTPUT: I/O last 3 completed shifts: In: 5775.5 [I.V.:2395.4; NG/GT:1880.1; IV Piggyback:1500] Out: 5360 [Urine:5360]  PHYSICAL EXAMINATION: General: comfortable on PST Neuro: RASS -2, follows commands/moves extremities on WUA HEENT:  ETT in place  (-) NVD Cardiovascular:  Regular  Good s1/s2.  Lungs:  Bronchial breath sounds. Bibasilar crackles.  Abdomen:  Soft, non tender Musculoskeletal:  Gr 2 edema Skin:  No rashes  LABS:  BMET  Recent Labs Lab 04/17/16 0350 04/17/16 1500 04/18/16 0455  NA 142 141 143  K 4.7 4.5 3.6  CL 102 98* 99*  CO2 33* 35* 39*  BUN 18 20 21*  CREATININE 0.48 0.44 0.36*  GLUCOSE 139* 125* 117*    Electrolytes  Recent Labs Lab 04/14/16 0600  04/16/16 0350 04/17/16 0350 04/17/16 1500 04/18/16 0455  CALCIUM 7.7*  < > 8.4* 8.3* 8.8* 8.5*  MG 1.7  --  1.9  --   --   --   PHOS 2.0*  --  3.4  --   --   --   < > = values in this interval not displayed.  CBC  Recent Labs Lab 04/16/16 0518 04/17/16 0350 04/18/16 0455  WBC 16.6* 15.3* 9.3  HGB 9.6* 8.6* 8.7*  HCT 30.5* 27.2* 27.9*  PLT 512* 563* 494*    Coag's No results for input(s): APTT, INR in the last 168 hours.  Sepsis  Markers  Recent Labs Lab 04/13/16 0840 04/14/16 0600 04/15/16 0446 04/16/16 0350  LATICACIDVEN 1.3  --   --   --   PROCALCITON 0.17 0.16 0.67 0.50    ABG  Recent Labs Lab 04/16/16 0507 04/17/16 0500 04/18/16 0325  PHART 7.352 7.436 7.462*  PCO2ART 62.3* 50.1* 53.2*  PO2ART 61.0* 67.9* 68.8*    Liver Enzymes  Recent Labs Lab 04/14/16 0600  AST 10*  ALT 8*  ALKPHOS 65  BILITOT 0.1*  ALBUMIN 1.5*    Cardiac Enzymes No results for input(s): TROPONINI, PROBNP in the last 168 hours.  Glucose  Recent Labs Lab 04/17/16 1219 04/17/16 1614 04/17/16 2021 04/17/16 2320 04/18/16 0427 04/18/16 0806  GLUCAP 138* 111* 147* 148* 100* 91    Imaging No results found.   STUDIES:  4/15 CT angio chest >> near complete consolidation Rt lung and central Lt lung, mediastinal LAN 4/19 Bronchoscopy  4/21 CT abd/pelvis >> anasarca. No significant change  CULTURES: 4/16 Blood >> (-) 4/18 Sputum >> (-) 4/19 BAL >> (-) 4/19 BAL AFB >> smear negative  4/19 BAL Fungal >> 4/16 MRSA (+)  ANTIBIOTICS: 4/15 Vancomycin >> 4/15 Cefepime >>   SIGNIFICANT EVENTS: 3/21-3/28  Admit for PNA, ARDS and had Rt axillary LN bx >> Hodgkin's lymphoma; seen by oncology 4/15 Admit 4/18 VDRF due to hypoxia/hypercapnia 4/19 Oncology consulted >> started high dose steroids  LINES/TUBES: 4/18 ETT >> 4/18 Lt IJ CVL >>   DISCUSSION: 30 yo female smoker was was in hospital from 3/21 to 3/28 with PNA and dx with Hodgkin's lymphoma.  She presented 4/15 with progressive dyspnea for worsening pneumonia.  She had worsening hypoxia/hypercapnia, and required intubation.  She has hx of asthma, HTN..  ASSESSMENT / PLAN:  PULMONARY A: Acute hypoxic/hypercapnic respiratory failure 2nd to progressive pulmonary infiltrates >> ?HCAP versus progression of lymphoma. Likely progressive lymphoma per TBx. Cultures remain (-) Tobacco abuse. Hx of asthma. P:   Full vent support. Do daily PST.  Adjust  PEEP/FiO2 to keep SpO2 > 92% Scheduled BDs Anticipate extubate in 1-2 days.   CARDIOVASCULAR A:  Hypotension after intubation >> resolved. Hx of HTN. Fluid overload P:  Diuresce, check lytes daily.    RENAL A:   Hyperkalemia >> likely from acidosis >> resolved. Volume overload. P:   F/u BMET diuresce  GASTROINTESTINAL A:   Protein calorie malnutrition. Abdominal distention 4/20 >> tolerating tube feeds. P:   Tube feeds while on vent Protonix for SUP F/u CT abd/pelvis 4/21 > anasarca. No sig change.   HEMATOLOGIC A:   Recent dx of Hodgkin's lymphoma. P:  High dose steroids started 4/19 Ctx started by Oncology 4/21  INFECTIOUS A:   ?HCAP >> procalcitonin not significantly elevated. P:   Plan to finish 7d of abx. On Vanc and Cefepime   ENDOCRINE A:   Episodes of hypoglycemia >> improved. P:   F/u CBG's  NEUROLOGIC A:   Acute metabolic encephalopathy. Improved.  P:   RASS goal: 0 - -1  SUMMARY: She has progressive Rt > Lt ASD.  She has been on several courses of broad spectrum antibiotics w/o improvement, and currently does not have significant elevation in procalcitonin.  Concern is that lung infiltrates represent her lymphoma rather than infection. Plan for Daily PST. Getting chemo. Finsig 7d of abx.    CC time 34 minutes.  Monica Becton, MD 04/18/2016, 9:00 AM [place] [facility] Pager [phone] After 3 pm or if no answer, call [phone]

## 2016-04-18 NOTE — Progress Notes (Signed)
   [facility] PCCM  Pt doing well on PST. PO2 60 on 40%. Has chronic hypercapnea, compensated.  Hypoxemia 2/2 infiltrates related to Lymphoma.   Plan to extubate pt. Likely will need FiO2 40-50%. Try to give lowest O2 2/2 hypercapnea.  May use bipap. Pt remains a full code. D/w re: aggressive cancer again.  Fentanyl prn for pain.   Monica Becton, MD 04/18/2016, 9:53 AM [place] [facility] Pager [phone] After 3 pm or if no answer, call [phone]

## 2016-04-19 ENCOUNTER — Inpatient Hospital Stay (HOSPITAL_COMMUNITY): Payer: Self-pay

## 2016-04-19 DIAGNOSIS — D72829 Elevated white blood cell count, unspecified: Secondary | ICD-10-CM

## 2016-04-19 DIAGNOSIS — R918 Other nonspecific abnormal finding of lung field: Secondary | ICD-10-CM

## 2016-04-19 LAB — GLUCOSE, CAPILLARY
GLUCOSE-CAPILLARY: 122 mg/dL — AB (ref 65–99)
GLUCOSE-CAPILLARY: 142 mg/dL — AB (ref 65–99)
GLUCOSE-CAPILLARY: 75 mg/dL (ref 65–99)
GLUCOSE-CAPILLARY: 84 mg/dL (ref 65–99)
Glucose-Capillary: 87 mg/dL (ref 65–99)

## 2016-04-19 LAB — COMPREHENSIVE METABOLIC PANEL
ALK PHOS: 64 U/L (ref 38–126)
ALT: 22 U/L (ref 14–54)
ANION GAP: 6 (ref 5–15)
AST: 22 U/L (ref 15–41)
Albumin: 1.8 g/dL — ABNORMAL LOW (ref 3.5–5.0)
BUN: 15 mg/dL (ref 6–20)
CALCIUM: 8.3 mg/dL — AB (ref 8.9–10.3)
CHLORIDE: 94 mmol/L — AB (ref 101–111)
CO2: 39 mmol/L — ABNORMAL HIGH (ref 22–32)
Glucose, Bld: 128 mg/dL — ABNORMAL HIGH (ref 65–99)
Potassium: 3.5 mmol/L (ref 3.5–5.1)
Sodium: 139 mmol/L (ref 135–145)
Total Bilirubin: 0.5 mg/dL (ref 0.3–1.2)
Total Protein: 4.7 g/dL — ABNORMAL LOW (ref 6.5–8.1)

## 2016-04-19 LAB — TRIGLYCERIDES: TRIGLYCERIDES: 150 mg/dL — AB (ref <150)

## 2016-04-19 MED ORDER — ACETAMINOPHEN 160 MG/5ML PO SOLN
650.0000 mg | Freq: Four times a day (QID) | ORAL | Status: DC | PRN
  Administered 2016-04-22: 650 mg via ORAL
  Filled 2016-04-19: qty 20.3

## 2016-04-19 MED ORDER — PANTOPRAZOLE SODIUM 40 MG PO TBEC
40.0000 mg | DELAYED_RELEASE_TABLET | Freq: Every day | ORAL | Status: DC
  Administered 2016-04-19 – 2016-04-26 (×8): 40 mg via ORAL
  Filled 2016-04-19 (×11): qty 1

## 2016-04-19 NOTE — Progress Notes (Signed)
Decreased fio2 to 6L [place] due to stable sats

## 2016-04-19 NOTE — Progress Notes (Signed)
Date:  April 19, 2016 Chart reviewed for concurrent status and case management needs. Will continue to follow patient for changes and needs:  High flow o2 at 100%Fi02 via [place] Velva Harman, BSN, [place], [place]

## 2016-04-19 NOTE — Procedures (Signed)
Thoracentesis Procedure Note  Pre-operative Diagnosis:  Possible pleural effusion   Post-operative Diagnosis: mostly atelectasis   Indications: pleural fluid analysus   Procedure Details  Consent: Informed consent was obtained. Risks of the procedure were discussed including: infection, bleeding, pain, pneumothorax.  Under sterile conditions the patient was positioned. Betadine solution and sterile drapes were utilized.  1% buffered lidocaine was used to anesthetize the subscapular rib space. Fluid was obtained without any difficulties and minimal blood loss.  A dressing was applied to the wound and wound care instructions were provided.   Findings 0 ml pleural fluid was obtained so we aborted procedure   Erick Colace ACNP-BC [place] Pager # [phone] OR # [phone] if no answer    Baltazar Apo, MD, PhD 04/22/2016, 4:41 PM [place] [facility] [phone] or if no answer [phone]

## 2016-04-19 NOTE — Progress Notes (Signed)
Pharmacy Antibiotic Note  Heidi [name] is a 30 y.o. female with recent diagnosis of Hodgkin's lymphoma and recently discharged from the hospital on 3/28,  admitted on 04/10/2016 with pneumonia. Patient's intubated and on the vent with plan for possible starting chemo therapy for lymphoma on 4/21 or 4/22.  Patient is currently on cefepime and vancomycin day #7 for PNA.  - 4/16 CXR: extensive multi focal PNA.  - 4/20 CXR: no change.  - 4/21 CXR: near total opacification of R hemi thorax consistent wit PNA and pleural effusion, increasing opacity on L consistent with progressive PNA - Afebrile, WBC improved to normal, ANC 8.5 (4/19) - Granix started 4/23 s/p chemo, SCr stable (CrCl >100) - PCT 0.50 (4/21)  Plan: - Cefepime dc'd yesterday - Vancomycin continues - 1250 mg IV q8h.   - Will check vancomycin trough level tomorrow morning if CCM feels it needs to continue. - Continue to monitor renal function, cultures, clinical course.  ___________________  Height: 5\' 6"  (167.6 cm) Weight: 160 lb 0.9 oz (72.6 kg) IBW/kg (Calculated) : 59.3  Temp (24hrs), Avg:98.5 F (36.9 C), Min:97.9 F (36.6 C), Max:98.9 F (37.2 C)   Recent Labs Lab 04/13/16 0840 04/14/16 0600 04/14/16 0900 04/14/16 2100 04/15/16 0446 04/16/16 0350 04/16/16 0518 04/16/16 1330 04/17/16 0350 04/17/16 1500 04/18/16 0455  WBC  --  9.1 10.0  --   --   --  16.6*  --  15.3*  --  9.3  CREATININE  --  0.50  --   --  0.45 0.46  --   --  0.48 0.44 0.36*  LATICACIDVEN 1.3  --   --   --   --   --   --   --   --   --   --   VANCOTROUGH  --   --   --  12  --   --   --  16  --   --   --     Estimated Creatinine Clearance: 104.9 mL/min (by C-G formula based on Cr of 0.36).    No Known Allergies  Antimicrobials this admission: 4/15 Vancomycin  >> 4/15 Cefepime  >>   Dose adjustments this admission: 4/17: changed cefepime to 2gm IV q8h for HAP 4/17 1533 VT = 8 mcg/ml on 500 mg q8h (~ 6.75h level), chg to 1g q8h  from 500mg  q8 4/19 2100 VT = 12 (on 1g q8h) --> increase to 1250 mg IV q8h 4/21 1330 VT =  16  (1250 mg IV q8h)  Microbiology results: 4/16 MRSA PCR (+) on chlx and bactroban 4/16 bcx x2: neg FINAL 4/16 strep pneu (-) 4/18 endo trach: neg FINAL 4/19 Bronch Fungus: 4/19 Bronch AFB: negative 4/19 BAL: neg FINAL 4/19 acid fast bronch biopsy:  4/19 fungus stain:   Thank you for allowing pharmacy to be a part of this patient's care.  Peggyann Juba, PharmD, BCPS Pager: [phone] 04/19/2016 9:48 AM

## 2016-04-19 NOTE — Progress Notes (Signed)
PULMONARY / CRITICAL CARE MEDICINE   Name: MERLINDA MCCLEAN MRN: [id number] DOB: [birth year]    ADMISSION DATE:  04/10/2016 CONSULTATION DATE:  04/13/2016  REFERRING MD:  Triad  CHIEF COMPLAINT:  Short of breath  SUBJECTIVE:  Extubated, now to [place] Comfortable Hungry  VITAL SIGNS: BP 139/80 mmHg  Pulse 77  Temp(Src) 98.6 F (37 C) (Oral)  Resp 18  Ht 5\' 6"  (1.676 m)  Wt 72.6 kg (160 lb 0.9 oz)  BMI 25.85 kg/m2  SpO2 94%  LMP 01/11/2016 (Approximate)  VENTILATOR SETTINGS: Vent Mode:  [-]  FiO2 (%):  [55 %] 55 %  INTAKE / OUTPUT: I/O last 3 completed shifts: In: 4142.4 [I.V.:1949.9; NG/GT:742.5; IV Piggyback:1450] Out: 8935 [Urine:8935]  PHYSICAL EXAMINATION: General: comfortable in bed Neuro: RASS 0, awake and alert, non-focal HEENT: OP clear, no lesions  Cardiovascular:  Regular  Normal s1/s2.  Lungs:  Bronchial breath sounds.  Abdomen:  Soft, non tender Musculoskeletal:  1-2+ edema Skin:  No rashes  LABS:  BMET  Recent Labs Lab 04/17/16 0350 04/17/16 1500 04/18/16 0455  NA 142 141 143  K 4.7 4.5 3.6  CL 102 98* 99*  CO2 33* 35* 39*  BUN 18 20 21*  CREATININE 0.48 0.44 0.36*  GLUCOSE 139* 125* 117*    Electrolytes  Recent Labs Lab 04/14/16 0600  04/16/16 0350 04/17/16 0350 04/17/16 1500 04/18/16 0455  CALCIUM 7.7*  < > 8.4* 8.3* 8.8* 8.5*  MG 1.7  --  1.9  --   --   --   PHOS 2.0*  --  3.4  --   --   --   < > = values in this interval not displayed.  CBC  Recent Labs Lab 04/16/16 0518 04/17/16 0350 04/18/16 0455  WBC 16.6* 15.3* 9.3  HGB 9.6* 8.6* 8.7*  HCT 30.5* 27.2* 27.9*  PLT 512* 563* 494*    Coag's No results for input(s): APTT, INR in the last 168 hours.  Sepsis Markers  Recent Labs Lab 04/13/16 0840 04/14/16 0600 04/15/16 0446 04/16/16 0350  LATICACIDVEN 1.3  --   --   --   PROCALCITON 0.17 0.16 0.67 0.50    ABG  Recent Labs Lab 04/17/16 0500 04/18/16 0325 04/18/16 0934  PHART 7.436 7.462* 7.375   PCO2ART 50.1* 53.2* 69.8*  PO2ART 67.9* 68.8* 60.2*    Liver Enzymes  Recent Labs Lab 04/14/16 0600  AST 10*  ALT 8*  ALKPHOS 65  BILITOT 0.1*  ALBUMIN 1.5*    Cardiac Enzymes No results for input(s): TROPONINI, PROBNP in the last 168 hours.  Glucose  Recent Labs Lab 04/18/16 1141 04/18/16 1608 04/18/16 2059 04/19/16 0046 04/19/16 0502 04/19/16 0740  GLUCAP 122* 75 76 75 87 84    Imaging No results found.   STUDIES:  4/15 CT angio chest >> near complete consolidation Rt lung and central Lt lung, mediastinal LAN 4/19 Bronchoscopy  4/21 CT abd/pelvis >> anasarca. No significant change  CULTURES: 4/16 Blood >> (-) 4/18 Sputum >> (-) 4/19 BAL >> (-) 4/19 BAL AFB >> smear negative  4/19 BAL Fungal >> 4/16 MRSA (+)  ANTIBIOTICS: 4/15 Vancomycin >> 4/24 4/15 Cefepime >> 4/24  SIGNIFICANT EVENTS: 3/21-3/28 Admit for PNA, ARDS and had Rt axillary LN bx >> Hodgkin's lymphoma; seen by oncology 4/15 Admit 4/18 VDRF due to hypoxia/hypercapnia 4/19 Oncology consulted >> started high dose steroids  LINES/TUBES: 4/18 ETT >> 4/23 4/18 Lt IJ CVL >>   DISCUSSION: 30 yo female smoker was was in  hospital from 3/21 to 3/28 with PNA and dx with Hodgkin's lymphoma.  She presented 4/15 with progressive dyspnea for worsening pneumonia.  Bx's ultimately consistent with parenchymal involvement. She had worsening hypoxia/hypercapnia, and required intubation. Extubated 4/23  She has hx of asthma, HTN..  ASSESSMENT / PLAN:  PULMONARY A: Acute hypoxic/hypercapnic respiratory failure 2nd to progressive pulmonary infiltrates >> ?HCAP versus progression of lymphoma. Likely progressive lymphoma per TBx. Cultures remain (-) Tobacco abuse. Hx of asthma. Possible R pleural effusion P:   Will repeat CXR, consider bedside R chest [place] to assess for clinically significant R effusion. Could consider thora if we believe that this would help with hypoxemia, WOB.  Scheduled BDs See  ID section  CARDIOVASCULAR A:  Hypotension after intubation >> resolved. Hx of HTN. Fluid overload P:  Diurese as tolerated, see renal   RENAL A:   Hyperkalemia >> likely from acidosis >> resolved. Volume overload. P:   F/u BMET Good diuresis, continue same lasix for another day, assess daily whether to continue based on renal fxn, BP  GASTROINTESTINAL A:   Protein calorie malnutrition. P:   Start diet 4/24 Protonix for SUP  HEMATOLOGIC / ONC A:   Hodgkin's lymphoma. P:  High dose steroids started 4/19 Ctx started by Oncology 4/21  INFECTIOUS A:   ?HCAP, completed therapy P:   Stop Vanco and cefepime 4/24, completed 10 days empiric rx  ENDOCRINE A:   Episodes of hypoglycemia >> improved. P:   F/u CBG's  NEUROLOGIC A:   Acute metabolic encephalopathy. Improved.  P:   RASS goal: 0  SUMMARY: She has progressive Rt > Lt ASD.  Infiltrates consistent with her lymphoma, now on therapy. Will d/c abx 4/24. Will transfer to SDU status, ask TRH to assume care 4/25. Will assess for possible benefit from thora.     Baltazar Apo, MD, PhD 04/19/2016, 10:18 AM [place] [facility] [phone] or if no answer [phone]

## 2016-04-19 NOTE — Progress Notes (Signed)
Nutrition Follow-up  INTERVENTION:   Provide Ensure Enlive po BID, each supplement provides 350 kcal and 20 grams of protein RD to continue to monitor for plan.   NEW NUTRITION DIAGNOSIS:   Increased nutrient needs related to cancer and cancer related treatments as evidenced by estimated needs.   GOAL:   Patient will meet greater than or equal to 90% of their needs  Not meeting.  MONITOR:   PO intake, Supplement acceptance, Labs, Weight trends, I & O's  ASSESSMENT:   30 y.o. female with a recent diagnosis of Lymphoma and hospitalization for Pneumonia and ARDS on 03/21-03/28 who presents to the ED with complaints of worsening SOB along with cough and Fevers and chills for the past 2 weeks.  Pt extubated 4/23, was receiving TF of Vital HP. Now NPO but per critical care note, diet now advanced to regular. Pt feels hungry. RD to order Ensure supplements. Will monitor for acceptance of supplement and PO intake.  Labs reviewed. Medications: IV Lasix every 12 hours, D5 -NaCl infusion @ 50 ml/hr- provides 204 kcal  Diet Order:  Diet regular Room service appropriate?: Yes; Fluid consistency:: Thin  Skin:  Wound (see comment) (closed arm incision)  Last BM:  4/23  Height:   Ht Readings from Last 1 Encounters:  04/18/16 5\' 6"  (1.676 m)    Weight:   Wt Readings from Last 1 Encounters:  04/19/16 160 lb 0.9 oz (72.6 kg)    Ideal Body Weight:  59.09 kg  BMI:  Body mass index is 25.85 kg/(m^2).  Estimated Nutritional Needs:   Kcal:  1850-2050  Protein:  90-100g  Fluid:  1.8L/day  EDUCATION NEEDS:   No education needs identified at this time  Clayton Bibles, MS, RD, LDN Pager: [phone] After Hours Pager: [phone]

## 2016-04-20 ENCOUNTER — Inpatient Hospital Stay (HOSPITAL_COMMUNITY): Payer: Self-pay

## 2016-04-20 DIAGNOSIS — E8770 Fluid overload, unspecified: Secondary | ICD-10-CM | POA: Clinically undetermined

## 2016-04-20 DIAGNOSIS — Z9889 Other specified postprocedural states: Secondary | ICD-10-CM | POA: Insufficient documentation

## 2016-04-20 DIAGNOSIS — E876 Hypokalemia: Secondary | ICD-10-CM

## 2016-04-20 LAB — GLUCOSE, CAPILLARY
GLUCOSE-CAPILLARY: 101 mg/dL — AB (ref 65–99)
GLUCOSE-CAPILLARY: 112 mg/dL — AB (ref 65–99)
GLUCOSE-CAPILLARY: 173 mg/dL — AB (ref 65–99)
GLUCOSE-CAPILLARY: 86 mg/dL (ref 65–99)
GLUCOSE-CAPILLARY: 93 mg/dL (ref 65–99)
Glucose-Capillary: 155 mg/dL — ABNORMAL HIGH (ref 65–99)
Glucose-Capillary: 179 mg/dL — ABNORMAL HIGH (ref 65–99)

## 2016-04-20 LAB — BASIC METABOLIC PANEL
ANION GAP: 8 (ref 5–15)
BUN: 11 mg/dL (ref 6–20)
CALCIUM: 8.3 mg/dL — AB (ref 8.9–10.3)
CO2: 41 mmol/L — AB (ref 22–32)
CREATININE: 0.32 mg/dL — AB (ref 0.44–1.00)
Chloride: 93 mmol/L — ABNORMAL LOW (ref 101–111)
Glucose, Bld: 92 mg/dL (ref 65–99)
Potassium: 3.4 mmol/L — ABNORMAL LOW (ref 3.5–5.1)
SODIUM: 142 mmol/L (ref 135–145)

## 2016-04-20 LAB — CBC
HCT: 28.2 % — ABNORMAL LOW (ref 36.0–46.0)
HEMOGLOBIN: 9.1 g/dL — AB (ref 12.0–15.0)
MCH: 29.2 pg (ref 26.0–34.0)
MCHC: 32.3 g/dL (ref 30.0–36.0)
MCV: 90.4 fL (ref 78.0–100.0)
PLATELETS: 534 10*3/uL — AB (ref 150–400)
RBC: 3.12 MIL/uL — AB (ref 3.87–5.11)
RDW: 14 % (ref 11.5–15.5)
WBC: 48.3 10*3/uL — AB (ref 4.0–10.5)

## 2016-04-20 MED ORDER — POTASSIUM CHLORIDE CRYS ER 20 MEQ PO TBCR
40.0000 meq | EXTENDED_RELEASE_TABLET | Freq: Once | ORAL | Status: AC
  Administered 2016-04-20: 40 meq via ORAL
  Filled 2016-04-20: qty 2

## 2016-04-20 MED ORDER — HYDROCODONE-HOMATROPINE 5-1.5 MG/5ML PO SYRP
5.0000 mL | ORAL_SOLUTION | Freq: Four times a day (QID) | ORAL | Status: DC | PRN
  Administered 2016-04-23 (×2): 5 mL via ORAL
  Filled 2016-04-20 (×2): qty 5

## 2016-04-20 NOTE — Progress Notes (Signed)
PT Cancellation Note  Patient Details Name: Heidi [name] MRN: [id number] DOB: 1986-05-27   Cancelled Treatment:    Reason Eval/Treat Not Completed: Attempted PT eval. Pt declined to participate and requested PT check back another time. Will check back as schedule allows-likley another day. Thanks.    Weston Anna, MPT Pager: [phone]

## 2016-04-20 NOTE — Progress Notes (Signed)
Interim  Discharge Summary       Patient ID: Heidi [name] MRN: [id number] DOB/AGE: July 26, 1986 30 y.o.  Admit date: 04/10/2016 Discharge date: 04/20/2016  Discharge Diagnoses:   Acute hypoxic/hypercapnic respiratory failure 2nd to progressive pulmonary infiltrates >> ?HCAP versus progression of lymphoma. Likely progressive lymphoma per TBx. Cultures remain (-) Tobacco abuse. Hx of asthma. Hypotension after intubation >> resolved. Hx of HTN.  Hyperkalemia >> likely from acidosis >> resolved. Volume overload. Protein calorie malnutrition. Hodgkin's lymphoma. HCAP (NOS and completed treatment) Episodes of hypoglycemia Acute metabolic encephalopathy->resolved   Detailed Hospital Course:   30 yo female was admitted in March 2017 with respiratory failure from pneumonia and ARDS. She was found to have diffuse adenopathy >> Rt axillary bx showed Hodgkin's lymphoma. She was set up for outpt follow up with oncology. On 4/15 she presented with worsening dyspnea. CXR and CT chest showed progressive Rt >> Lt ASD. She was started on Abx for HCAP and supplemental oxygen. On 4/18 she developed worsening mental status. This was associated with progressive hypoxia and hypercapnia with respiratory acidosis. She was tried on BiPAP w/o improvement and required intubation. Her hospital course high-lights are as follows:  4/18 VDRF due to hypoxia/hypercapnia 4/19 Oncology consulted >> started high dose steroids thinking that her decompensation was related to progression of her Lymphoma. Bronchoscopic biopsy consistent with CHL (I discussed this with Dr Thelma Barge).  4/21 started on ABVD (bleomycin held for now given significant pulmonary changes). -dexamethasone given for 2-3 days post chemotherapy. 4/23 extubated. Weaning O2.  4/24 attempted thora-->dry tap. F/u review on prior CT scan showing mostly complete opacification/collapse. 4/25 transferred to IM service.    Discharge Plan  by active problems  Acute hypoxic/hypercapnic respiratory failure 2nd to progressive pulmonary infiltrates >> ?HCAP versus progression of lymphoma. Likely progressive lymphoma per TBx. Cultures remain (-) Tobacco abuse. Hx of asthma. High dose steroids started 4/19 Ctx started by Oncology 4/21 P:  Wean o2 Scheduled BDs See ID mobilize  Fluid overload P:  Diurese as tolerated, see renal   Protein calorie malnutrition. P:  Start diet 4/24 Protonix for [facility] tests/ studies  Consults: heme/onc   STUDIES:  4/15 CT angio chest >> near complete consolidation Rt lung and central Lt lung, mediastinal LAN 4/19 Bronchoscopy  4/21 CT abd/pelvis >> anasarca. No significant change  CULTURES: 4/16 Blood >> (-) 4/18 Sputum >> (-) 4/19 BAL >> (-) 4/19 BAL AFB >> smear negative  4/19 BAL Fungal >> 4/16 MRSA (+)  ANTIBIOTICS: 4/15 Vancomycin >> 4/24 4/15 Cefepime >> 4/24  Discharge Exam: BP 134/70 mmHg  Pulse 96  Temp(Src) 98.4 F (36.9 C) (Oral)  Resp 24  Ht 5\' 6"  (1.676 m)  Wt 160 lb 0.9 oz (72.6 kg)  BMI 25.85 kg/m2  SpO2 96%  LMP 01/11/2016 (Approximate)   General: comfortable in bed Neuro: RASS 0, awake and alert, non-focal HEENT: OP clear, no lesions  Cardiovascular: Regular Normal s1/s2.  Lungs: Bronchial breath sounds.  Abdomen: Soft, non tender Musculoskeletal: 1-2+ edema Skin: No rashes  Labs at discharge Lab Results  Component Value Date   CREATININE 0.32* 04/20/2016   BUN 11 04/20/2016   NA 142 04/20/2016   K 3.4* 04/20/2016   CL 93* 04/20/2016   CO2 41* 04/20/2016   Lab Results  Component Value Date   WBC 48.3* 04/20/2016   HGB 9.1* 04/20/2016   HCT 28.2* 04/20/2016   MCV 90.4 04/20/2016   PLT 534* 04/20/2016   Lab  Results  Component Value Date   ALT 22 04/19/2016   AST 22 04/19/2016   ALKPHOS 64 04/19/2016   BILITOT 0.5 04/19/2016   Lab Results  Component Value Date   INR 1.21 03/16/2016     Current radiology studies Dg Chest 2 View  04/20/2016  CLINICAL DATA:  Shortness of breath, cough, dizziness.  Lymphoma. EXAM: CHEST  2 VIEW COMPARISON:  04/19/2016 FINDINGS: Dense consolidation throughout much of the right lung. Patchy nodular opacities in the mid and lower lung. Mild cardiomegaly. No visible effusions. No acute bony abnormality. Left central line tip in the SVC, unchanged. IMPRESSION: Extensive bilateral nodular and masslike airspace disease, right much greater than left. No real change since prior study. This presumably represents multifocal pneumonia Electronically Signed   By: Rolm Baptise M.D.   On: 04/20/2016 09:49   Dg Chest Port 1 View  04/19/2016  CLINICAL DATA:  Unsuccessful thoracentesis EXAM: PORTABLE CHEST 1 VIEW COMPARISON:  04/17/2016 FINDINGS: Endotracheal and NG tubes have been removed. Left jugular central venous catheter is stable with its tip at the upper SVC. Extensive bilateral consolidation of right groin left has improved. There is no evidence of pneumothorax. IMPRESSION: No evidence of pneumothorax Bilateral airspace disease right greater than left has improved Extubated. Electronically Signed   By: Marybelle Killings M.D.   On: 04/19/2016 14:36    Disposition: To IM service   Signed: Clementeen Graham 04/20/2016, 1:18 PM

## 2016-04-20 NOTE — Progress Notes (Signed)
OT Cancellation Note  Patient Details Name: Heidi [name] MRN: [id number] DOB: [birth year]   Cancelled Treatment:    Reason Eval/Treat Not Completed: Other (comment) -- Attempted OT evaluation; pt declined treatment, stating she wanted to nap, asked therapist to "come back another time." Will follow up another time for OT evaluation.  Norwood Quezada A 04/20/2016, 11:04 AM

## 2016-04-20 NOTE — Progress Notes (Signed)
[name] Kitchen   HEMATOLOGY/ONCOLOGY INPATIENT PROGRESS NOTE  Date of Service: 04/19/2016 Inpatient Attending: .Collene Gobble, MD  SUBJECTIVE  Patient was seen.  He has been eating well.  Notes that her breathing is better and she has less secretions.  Notes no other acute new symptoms. No other acute new symptoms.  Am looking forward to working with therapies and getting up and about.  Wondering when she will be transferred out of the ICU.   OBJECTIVE:  PHYSICAL EXAMINATION: . Filed Vitals:   04/19/16 1959 04/19/16 2000 04/19/16 2100 04/19/16 2200  BP:  139/67 142/68 140/76  Pulse:  80 99 99  Temp:  99.2 F (37.3 C)    TempSrc:  Oral    Resp:  _0 Height:      Weight:      SpO2: 98% 97% 97% 100%   Filed Weights   04/17/16 0300 04/18/16 0200 04/19/16 0500  Weight: 173 lb 8 oz (78.7 kg) 171 lb 11.8 oz (77.9 kg) 160 lb 0.9 oz (72.6 kg)   .Body mass index is 25.85 kg/(m^2).   [name] Kitchen Wt Readings from Last 3 Encounters:  04/19/16 160 lb 0.9 oz (72.6 kg)  03/23/16 128 lb 8 oz (58.287 kg)  05/15/14 153 lb 11.2 oz (69.718 kg)   GENERAL:Awake alert and oriented 3 SKIN: No acute rashes OROPHARYNX: Patient extubated this morning. Coated tongue. NECK: supple, no JVD LYMPH:  Small cervical and axillary lymphadenopathy  LUNGS: Bilateral coarse breath sounds with bilateral wheezes HEART: regular rate & rhythm ABDOMEN: abdomen soft, nontender, normoactive bowel sounds NEURO: Awake alert and oriented 3, moving all 4 extremities. No overt focal neurological deficit.  MEDICAL HISTORY:  Past Medical History  Diagnosis Date  . Asthma   . Hodgkin lymphoma ([place])   . Hypertension     SURGICAL HISTORY: Past Surgical History  Procedure Laterality Date  . Axillary lymph node biopsy Right 03/19/2016    Procedure: AXILLARY LYMPH NODE BIOPSY;  Surgeon: Armandina Gemma, MD;  Location: [facility];  Service: General;  Laterality: Right;    SOCIAL HISTORY: Social History   Social History  . Marital  Status: Single    Spouse Name: N/A  . Number of Children: N/A  . Years of Education: N/A   Occupational History  . Not on file.   Social History Main Topics  . Smoking status: Current Every Day Smoker -- 0.50 packs/day    Types: Cigarettes  . Smokeless tobacco: Not on file  . Alcohol Use: Yes     Comment: PT drinks 2 40 oz beer per day   . Drug Use: Not on file  . Sexual Activity: Not on file   Other Topics Concern  . Not on file   Social History Narrative    FAMILY HISTORY: No family history on file.  ALLERGIES:  has No Known Allergies.  MEDICATIONS:  Scheduled Meds: . antiseptic oral rinse  7 mL Mouth Rinse BID  . budesonide (PULMICORT) nebulizer solution  0.5 mg Nebulization BID  . enoxaparin (LOVENOX) injection  40 mg Subcutaneous QHS  . furosemide  40 mg Intravenous Q12H  . insulin aspart  0-15 Units Subcutaneous 6 times per day  . ipratropium-albuterol  3 mL Nebulization QID  . pantoprazole  40 mg Oral Daily  . Tbo-Filgrastim  480 mcg Subcutaneous q1800   Continuous Infusions: . dextrose 5 % and 0.9% NaCl 50 mL/hr at 04/19/16 1900   PRN Meds:.acetaminophen (TYLENOL) oral liquid 160 mg/5 mL, alteplase, Cold Pack,  diphenhydrAMINE, fentaNYL (SUBLIMAZE) injection, heparin lock flush, heparin lock flush, levalbuterol, midazolam, [DISCONTINUED] ondansetron **OR** ondansetron (ZOFRAN) IV, sodium chloride flush, sodium chloride flush  REVIEW OF SYSTEMS:    10 Point review of Systems was done is negative except as noted above.   LABORATORY DATA:   . CBC Latest Ref Rng 04/18/2016 04/17/2016 04/16/2016  WBC 4.0 - 10.5 K/uL 9.3 15.3(H) 16.6(H)  Hemoglobin 12.0 - 15.0 g/dL 8.7(L) 8.6(L) 9.6(L)  Hematocrit 36.0 - 46.0 % 27.9(L) 27.2(L) 30.5(L)  Platelets 150 - 400 K/uL 494(H) 563(H) 512(H)   . CMP Latest Ref Rng 04/19/2016 04/18/2016 04/17/2016  Glucose 65 - 99 mg/dL 128(H) 117(H) 125(H)  BUN 6 - 20 mg/dL 15 21(H) 20  Creatinine 0.44 - 1.00 mg/dL <0.30(L) 0.36(L) 0.44    Sodium 135 - 145 mmol/L 139 143 141  Potassium 3.5 - 5.1 mmol/L 3.5 3.6 4.5  Chloride 101 - 111 mmol/L 94(L) 99(L) 98(L)  CO2 22 - 32 mmol/L 39(H) 39(H) 35(H)  Calcium 8.9 - 10.3 mg/dL 8.3(L) 8.5(L) 8.8(L)  Total Protein 6.5 - 8.1 g/dL 4.7(L) - -  Total Bilirubin 0.3 - 1.2 mg/dL 0.5 - -  Alkaline Phos 38 - 126 U/L 64 - -  AST 15 - 41 U/L 22 - -  ALT 14 - 54 U/L 22 - -       Microscopic Comment LYMPHOMA Histologic type: Classical Hodgkin lymphoma, mixed cellularity type Grade (if applicable): N/A Flow cytometry: No monoclonal B cell population or abnormal T cell phenotype identified ([id number]). Immunohistochemical stains: LCA, cytokeratin AE1/AE3, S100, CD15 CD30, CD163, CD20, PAX-5, CD21, CD3, CD68, CD1a, CD43, CD4, CD5, CD8, ALK protein, CD45RO, CD79a, CD10, kappa and lambda with appropriate controls. Touch preps/imprints: Scanty but with scattered large atypical mononuclear and multilobated lymphoid appearing cells. Comments: The sections show diffuse effacement of the architecture by an atypical lymphohistiocytic process characterized associated with scattered foci of necrosis. In this background, scattered individual and small clusters of large atypical mononuclear and multilobated lymphoid appearing cells are seen displaying vesicular chromatin, variably prominent nucleoli and abundant amphophilic cytoplasm. Prominent sinusoidal pattern is not appreciated and areas of collagenous fibrosis are not seen. Admixed are small lymphocytes, eosinophils, and plasma cells to variable extent. To further evaluate this process, flow cytometric analysis was performed but failed to show any monoclonal B cell population or abnormal T cell phenotype. A large battery of immunohistochemical stains were performed and show 1 of 2 FINAL for KEYUNA, CUTHRELL [id number]) Microscopic Comment(continued) that the large atypical lymphoid appearing cells are positive for CD30 and scattered cells for  CD15. They are negative for CD3, CD20, LCA, PAX-5, ALK protein, CD10, CD4, CD43, CD45RO, CD5, CD8, CD79a, EMA, cytoplasmic kappa, cytoplasmic lambda. The lymphoid component in the background shows a mixture of T and B cells with predominance of T cells containing a mixture of CD4 and CD8 positive cells. The abundant histiocytic component in the background is positive for CD4, CD68 and CD163 and mostly negative for CD1a, S100 and CD21. No significant cytokeratin (AE1/AE3) positivity is identified. The overall histologic and immunophenotypic features are consistent with classical Hodgkin lymphoma which is best subclassified as mixed cellularity-type. (BNS:kh:ecj 03-24-16) Susanne Greenhouse MD Pathologist, Electronic Signature (Case signed 03/24/2016)    RADIOGRAPHIC STUDIES: I have personally reviewed the radiological images as listed and agreed with the findings in the report. Ct Abdomen Pelvis Wo Contrast  04/16/2016  CLINICAL DATA:  Lymphoma, respiratory failure, anasarca. EXAM: CT ABDOMEN AND PELVIS WITHOUT CONTRAST TECHNIQUE: Multidetector CT imaging of  the abdomen and pelvis was performed following the standard protocol without IV contrast. COMPARISON:  Chest CT 04/10/2016. FINDINGS: Lower chest: Persistent bilateral pleural effusions, bilateral infiltrates and pulmonary nodules. Hepatobiliary: No obvious hepatic lesions but study is limited without IV contrast. The gallbladder is grossly normal. Pancreas: No obvious mass, inflammation or ductal dilatation. Spleen: Normal size.  No obvious lesions. Adrenals/Urinary Tract: The adrenal glands and kidneys are grossly normal. Stomach/Bowel: There is an NG tube in the stomach. The duodenum, small bowel and colon are grossly normal. Vascular/Lymphatic: Retroperitoneal lymphadenopathy. 2 cm retroperitoneal lymph node on image number 42. The aorta is normal in caliber. No atherosclerotic calcifications. Reproductive: The uterus and ovaries are grossly normal.  Other: The bladder contains a Foley catheter. There is a moderate amount of free pelvic fluid. No inguinal adenopathy. Diffuse body wall edema suggesting anasarca. Musculoskeletal: No significant bony findings. IMPRESSION: 1. Limited examination due to lack of IV contrast, artifact from the right arm and motion. 2. Persistent effusions, infiltrates and pulmonary nodules. 3. Diffuse body wall edema suggesting anasarca. 4. Mesenteric and retroperitoneal lymphadenopathy. 5. Moderate free pelvic fluid. Electronically Signed   By: Marijo Sanes M.D.   On: 04/16/2016 11:37   Dg Chest 1 View  04/11/2016  CLINICAL DATA:  Shortness of breath.  History of lymphoma. EXAM: CHEST 1 VIEW COMPARISON:  Chest radiograph 04/10/2016; chest CT 04/10/2016 FINDINGS: Grossly unchanged diffuse opacification of the right hemi thorax with worsening opacities involving the left mid and upper lung. No definite pleural effusion or pneumothorax. Regional skeleton is unremarkable. IMPRESSION: Interval worsening opacities left upper lung with persistent near complete opacification of the right hemi thorax, findings overall compatible with extensive multi focal pneumonia. Known mediastinal and hilar adenopathy. Electronically Signed   By: Lovey Newcomer M.D.   On: 04/11/2016 17:03   Dg Chest 2 View  04/10/2016  CLINICAL DATA:  Patient with shortness of breath for multiple weeks. Recently diagnosed with lymphoma. EXAM: CHEST  2 VIEW COMPARISON:  Chest CT 03/16/2016; chest radiograph 03/22/2016. FINDINGS: Multiple monitoring leads overlie the patient. Stable cardiac and mediastinal contours with extensive bilateral hilar and mediastinal adenopathy. Significant worsening of consolidation within the right lung. Persistent consolidation within the left mid lung with associated small nodular opacities. Possible right pleural effusion. No pneumothorax. Regional skeleton is unremarkable. IMPRESSION: Interval worsening of consolidation throughout the  majority of the right lung which may represent malignancy, postobstructive pneumonia and/or effusion. Persistent hilar and mediastinal adenopathy. Unchanged scattered opacities and consolidation within the left lung. Electronically Signed   By: Lovey Newcomer M.D.   On: 04/10/2016 19:32   Ct Angio Chest Pe W/cm &/or Wo Cm  04/10/2016  CLINICAL DATA:  Acute onset of worsening shortness of breath. Bilateral leg swelling. Recently diagnosed with Hodgkin's lymphoma. Initial encounter. EXAM: CT ANGIOGRAPHY CHEST WITH CONTRAST TECHNIQUE: Multidetector CT imaging of the chest was performed using the standard protocol during bolus administration of intravenous contrast. Multiplanar CT image reconstructions and MIPs were obtained to evaluate the vascular anatomy. CONTRAST:  100 mL of Isovue 370 IV contrast COMPARISON:  CT of the chest performed 03/16/2016, and chest radiograph performed 04/10/2016 FINDINGS: There is no evidence of pulmonary embolus. There is near complete dense consolidation of the right lung, and central consolidation involving the left upper lobe, with additional scattered nodular opacities throughout the left lung. This is significantly worsened from the prior CT, reflecting worsening bilateral pneumonia. On correlation with right axillary lymph node biopsy results, the patient was recently diagnosed with  classic Hodgkin's lymphoma. Hodgkin's lymphoma typically would not narrow the airway to the extent required for postobstructive pneumonia, and is likely unrelated to the patient's presentation with diffuse bilateral pneumonia. There is no evidence of pleural effusion or pneumothorax. Extensive enlarged mediastinal nodes are seen, particularly anteriorly, measuring up to 1.8 cm in short axis. Right axillary nodes measure up to 2.4 cm in short axis. These are compatible with the patient's Hodgkin's lymphoma. A small 3.8 cm collection of fluid at the right axilla likely reflects a postoperative seroma,  status post recent right hilar lymph node biopsy. No pericardial effusion is identified. The great vessels are grossly unremarkable in appearance. The visualized portions of the thyroid gland are unremarkable in appearance. The visualized portions of the liver and spleen are unremarkable. No acute osseous abnormalities are seen. Review of the MIP images confirms the above findings. IMPRESSION: 1. No evidence of pulmonary embolus. 2. Near complete dense consolidation of the right lung, and central consolidation involving the left upper lobe, with scattered nodular opacities throughout the left lung. This is significantly worsened from the prior CT, reflecting worsening bilateral pneumonia. 3. The patient was recently diagnosed with classic Hodgkin's lymphoma for right axillary lymph node biopsy results. The Hodgkin's lymphoma is likely unrelated to the patient's diffuse bilateral pneumonia, as lymph nodes typically do not obstruct the tracheobronchial tree. Enlarged mediastinal and right axillary nodes reflect the patient's lymphoma. 4. 3.8 cm collection of fluid at the right axilla likely reflects a postoperative seroma, status post recent right hilar lymph node biopsy. These results were called by telephone at the time of interpretation on 04/10/2016 at 9:19 pm to [facility], who verbally acknowledged these results. Electronically Signed   By: Garald Balding M.D.   On: 04/10/2016 21:26   Dg Chest Port 1 View  04/19/2016  CLINICAL DATA:  Unsuccessful thoracentesis EXAM: PORTABLE CHEST 1 VIEW COMPARISON:  04/17/2016 FINDINGS: Endotracheal and NG tubes have been removed. Left jugular central venous catheter is stable with its tip at the upper SVC. Extensive bilateral consolidation of right groin left has improved. There is no evidence of pneumothorax. IMPRESSION: No evidence of pneumothorax Bilateral airspace disease right greater than left has improved Extubated. Electronically Signed   By: Marybelle Killings  M.D.   On: 04/19/2016 14:36   Dg Chest Port 1 View  04/17/2016  CLINICAL DATA:  Respiratory failure. Hx asthma, Hodgkin lymphoma, htn EXAM: PORTABLE CHEST 1 VIEW COMPARISON:  04/16/2016 FINDINGS: Endotracheal tube is in place, tip 5.1 cm above the carina. Nasogastric tube is in place with tip off the film beyond the gastroesophageal junction. Left IJ central line tip overlies the level of the superior vena cava. There are dense opacities in the lungs bilaterally, right greater than left. Bilateral pleural effusions are present. IMPRESSION: 1. No significant change in the appearance of lines and tubes. 2. Stable appearance of significant bilateral infiltrates, right greater than left. Electronically Signed   By: Nolon Nations M.D.   On: 04/17/2016 07:38   Dg Chest Port 1 View  04/16/2016  CLINICAL DATA:  Respiratory failure healthcare associated pneumonia intubated patient EXAM: PORTABLE CHEST 1 VIEW COMPARISON:  Portable chest x-ray of April 15, 2016 FINDINGS: The right hemi thorax remains largely obscured with only a small amount of aerated upper lobe visible. The left lung is better aerated but increased density has developed in the retrocardiac region and there is further obscuration of the left hemidiaphragm. A small portion of the left heart border is visible.  The pulmonary vascularity is indistinct. The endotracheal tube tip lies 6.4 cm above the carina. The esophagogastric tube tip projects below the inferior margin of the image. The left internal jugular venous catheter tip projects over the proximal SVC. IMPRESSION: Near total opacification of the right hemi thorax consistent with pneumonia and pleural effusion. Increasing opacity on the left consistent with progressive pneumonia. No significant left-sided pleural effusion is visible but is likely obscured. Electronically Signed   By: David  Martinique M.D.   On: 04/16/2016 07:05   Dg Chest Port 1 View  04/15/2016  CLINICAL DATA:  Respiratory  failure, evaluate endotracheal tube position EXAM: PORTABLE CHEST 1 VIEW COMPARISON:  04/14/2016 FINDINGS: Endotracheal tube unchanged with tip 5.5 cm above the carina. Orogastric and left IJ support devices stable. Extensive opacification over the right lung stable. Patchy opacification over the left lung stable. IMPRESSION: No change from prior study. Electronically Signed   By: Skipper Cliche M.D.   On: 04/15/2016 07:00   Dg Chest Port 1 View  04/14/2016  CLINICAL DATA:  30 year old female with history of right lower lobe biopsy. EXAM: PORTABLE CHEST 1 VIEW COMPARISON:  Chest x-ray 04/14/2016. FINDINGS: An endotracheal tube is in place with tip 5.8 cm above the carina. There is a left-sided internal jugular central venous catheter with tip terminating in the proximal superior vena cava. A nasogastric tube is seen extending into the stomach, however, the tip of the nasogastric tube extends below the lower margin of the image. Again noted is extensive airspace consolidation throughout the lungs bilaterally (right greater than left), with some improving aeration in the right upper lobe compared to the prior examination. Small left pleural effusion. Possible right pleural effusion, difficult to judge given the extent of right-sided airspace consolidation on this single view examination. No evidence of pulmonary edema. Heart size is upper limits of normal. Upper mediastinal contours are within normal limits. IMPRESSION: 1. Slight improvement in aeration in the right upper lobe. Otherwise, the appearance the chest is similar, again most compatible with multilobar pneumonia. 2. Support apparatus, as above. Electronically Signed   By: Vinnie Langton M.D.   On: 04/14/2016 13:48   Dg Chest Port 1 View  04/14/2016  CLINICAL DATA:  Endotracheal tube position, Hodgkin lymphoma. EXAM: PORTABLE CHEST 1 VIEW COMPARISON:  04/13/2016 and CT chest 04/10/2016. FINDINGS: Endotracheal tube terminates approximately 6.5 cm  above carina, stable. Nasogastric tube is followed into the stomach. Left IJ central line tip projects at the junction of the brachiocephalic veins. Near complete opacification of the right hemithorax with leftward shift of the heart and mediastinum, as before. Patchy airspace consolidation bilaterally. Probable small left pleural effusion. IMPRESSION: 1. Dense consolidation throughout the right lung with patchy areas of consolidation in the left lung, likely due to pneumonia. 2. Possible small left pleural effusion. Electronically Signed   By: Lorin Picket M.D.   On: 04/14/2016 07:07   Dg Chest Port 1 View  04/13/2016  CLINICAL DATA:  Status post central catheter placement. Hodgkin's lymphoma. EXAM: PORTABLE CHEST 1 VIEW COMPARISON:  Study obtained earlier in the day as well as chest CT April 10, 2016 FINDINGS: Central catheter tip is in the superior vena cava near the junction with the left innominate vein. Endotracheal tube tip is 4.4 cm above the carina. Nasogastric tube tip and side port are below the diaphragm. No pneumothorax. There is extensive consolidation with probable superimposed effusion, causing opacification of most of the right hemithorax, stable. There are areas of consolidation in  the left mid lung with slightly less overall opacity compared to earlier in the day. The heart size and pulmonary vascularity are normal. It is difficult to assess for adenopathy given the overlying areas of consolidation. No bone lesions are evident. IMPRESSION: Tube and catheter positions as described without pneumothorax. Widespread pneumonia stable on the right with slightly less overall opacity on the left compared to earlier in the day. Confluent opacity is present in the left mid lung focally. Stable cardiac silhouette. Electronically Signed   By: Lowella Grip III M.D.   On: 04/13/2016 11:20   Dg Chest Port 1 View  04/13/2016  CLINICAL DATA:  Endotracheal orogastric tube placement EXAM: PORTABLE  CHEST 1 VIEW COMPARISON:  Two days ago FINDINGS: Endotracheal tube tip just below the clavicular heads. An orogastric tube reaches the stomach. Bilateral pneumonia as demonstrated on recent chest CT, nearly confluent throughout the right lung. Air bronchograms are intermittently seen. No evidence of air leak or cavitation. No visible significant pleural effusion. Normal heart size. IMPRESSION: 1. Unremarkable positioning new endotracheal or orogastric tubes. 2.  Bilateral pneumonia without progression since 2 days ago. Electronically Signed   By: Monte Fantasia M.D.   On: 04/13/2016 08:23   Dg Chest Port 1 View  03/22/2016  CLINICAL DATA:  Cough, dyspnea at rest, bilateral parenchymal consolidation, known pneumonia. EXAM: PORTABLE CHEST 1 VIEW COMPARISON:  Portable chest x-ray of March 21, 2016 FINDINGS: The left lung is well-expanded. A large left hilar mass is stable. On the right confluent alveolar opacity occupies approximately 1/3 to 1/2 of the lung volume. This appears stable and is centered in the perihilar and infrahilar regions. The interstitial markings of both lungs are mildly prominent. The cardiac silhouette is top-normal in size. The pulmonary vascularity is not clearly engorged. There is no pleural effusion or pneumothorax. The trachea is midline. The observed bony thorax exhibits no acute abnormality. IMPRESSION: Persistent bilateral parenchymal opacities not greatly changed from the earlier study. The findings are consistent with known malignancy with lymphadenopathy and postobstructive atelectatic changes especially on the right. Electronically Signed   By: David  Martinique M.D.   On: 03/22/2016 07:08   Dg Chest Port 1 View  03/21/2016  CLINICAL DATA:  30 year old female with ARDS and asthma. EXAM: PORTABLE CHEST 1 VIEW COMPARISON:  Radiograph dated 03/19/2016 FINDINGS: Single-view of the chest demonstrates a large area of airspace opacity in the right mid lung field as well as a smaller area  of opacity in the left suprahilar region. Smaller nodular ground-glass opacity noted in the left mid lung field. Overall the lung opacities appear more confluent on this study compared to the prior study. There is no pleural effusion or pneumothorax. The cardiac silhouette is within normal limits. No acute osseous pathology identified. IMPRESSION: Bilateral airspace opacities, right greater than left. Follow-up recommended. Electronically Signed   By: Anner Crete M.D.   On: 03/21/2016 05:16   Dg C-arm Bronchoscopy  04/14/2016  CLINICAL DATA:  C-ARM BRONCHOSCOPY Fluoroscopy was utilized by the requesting physician.  No radiographic interpretation.    ASSESSMENT & PLAN:   30 year old African-American female with  #1 Mixed cellularity Hodgkin's lymphoma IVB with extensive lymphadenopathy including right axillary, mediastinal and upper retroperitoneal and now biopsy proven pulmonary involvement. She was noted to have significant constitutional symptoms including significant weight loss, fevers chills and some night sweats. HIV negative Hepatitis C and hepatitis B serologies negative. Echo with normal ejection fraction.  #2 hypoxic respiratory failure with dense right lung consolidation  and left upper lobe consolidation. Pulmonary biopsy +ve for CHL  Cannot rule out superadded pneumonia. Appears to have some chronic hypercapnia. BAL cx no growth thus far. procalcitonin not very impressive to suggest overt sepsis. Patient notes breathing much improved.  On high flow nasal cannula at 6 L/m.  #3 ABdominal distension - per RN no increased residuals with tube feeding. CT abd with mesenteric and retroperitoneal LNadenopathy no other acute abdominal pathology.  Plan -improving breathing and cough. -Seen by nutritional therapist to optimize oral intake. -improving by mouth intake  -Ongoing antibiotics as per MICU team. -Continue daily Tbo-filgrastim to reduce the degree and duration of  neutropenia from chemotherapy. -No prohibitive toxicity from chemotherapy at this time. No nausea.  -Daily CBC and BMP  Appreciate the excellent cares by the critical care team . We'll continue to follow along .  Sullivan Lone MD [place] AAHIVMS [facility] Hematology/Oncology Physician [facility]  (Office):       [phone] (Work cell):  [phone] (Fax):           [phone]

## 2016-04-20 NOTE — Progress Notes (Signed)
PROGRESS NOTE    Heidi [name]  [id number] DOB: October 07, 1986 DOA: 04/10/2016 PCP: No primary care provider on file.  Outpatient Specialists: Oncology: Dr Irene Limbo     Brief Narrative:  30 yo female was admitted in March 2017 with respiratory failure from pneumonia and ARDS. She was found to have diffuse adenopathy >> Rt axillary bx showed Hodgkin's lymphoma. She was set up for outpt follow up with oncology. On 4/15 she presented with worsening dyspnea. CXR and CT chest showed progressive Rt >> Lt ASD. She was started on Abx for HCAP and supplemental oxygen. On 4/18 she developed worsening mental status. This was associated with progressive hypoxia and hypercapnia with respiratory acidosis. She was tried on BiPAP w/o improvement and required intubation. Patient was subsequently transferred to [facility] service. Patient finished a ten-day course of antibiotic treatment and underwent bronchoscopy with biopsies and BAL. Biopsies consistent with mixed cellularity Hodgkin's lymphoma. Oncology was consulted and followed the patient throughout the hospitalization. Patient was started on chemotherapy per oncology. Patient subsequently extubated 04/18/2016 and currently on 6 L nasal cannula as well as bronchodilators. Patient transferred to medicine service 04/20/2016.  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 Patient status post 10 days antibiotics for probable HCAP. HIV negative. Hep C and hep B serologies negative. Patient has been started on chemotherapy treatment as well as daily Granix per oncology. Oncology following and appreciate input and recommendations.  #2 acute respiratory failure with hypoxia secondary to Hodgkin's lymphoma and questionable HCAP Status post vent-dependent respiratory failure. Patient was extubated and currently on 6 L nasal cannula. Patient with clinical improvement. Patient status post 10 days antibiotic treatment for HCAP. BAL cultures negative to date. Continue Pulmicort, DuoNeb nebs, IV Lasix. Placed on Hycodan as needed. Follow.  #3?? HCAP Status post bronchoscopy 04/14/2016 with BA cultures with no growth to date. Blood cultures negative 2. Urine strep pneumococcus antigen is negative. Patient now on 6 L nasal cannula. Patient status post 10 days of antibiotic treatment. Continue current nebulizer treatments. Hycodan as needed.  #4 hypokalemia Likely secondary to diuretics. Replete.  #5 volume overload CT abdomen and pelvis with persistent effusions, infiltrates and pulmonary nodules, diffuse body wall edema suggesting anasarca and moderate free pelvic fluid. Continue IV Lasix and monitor renal function closely. Follow.  #6 leukocytosis Likely secondary to problem #1 and GRANIX. Patient was pancultured with no growth today. Patient status post 10 days of antibiotics.??? D/c granix. Will defer to oncology.  #7 abdominal distention CT abdomen and pelvis with mesenteric and retroperitoneal lymphadenopathy as well as suggesting anasarca. Patient on IV Lasix. Patient receiving chemotherapy. Follow.  #8 protein calorie malnutrition Patient started on a diet. PPI.  #9 acute metabolic encephalopathy Likely secondary to problem #1 and 2. Improved.  #10 possible right pleural effusion Status post thoracentesis 04/19/2016 which was aborted as 0 pleural  fluid was obtained. Follow.   DVT prophylaxis: Lovenox Code Status: Full Family Communication: Updated patient. No  family at bedside. Disposition Plan: Remain the step down unit. Transfer to MedSurg once O2 requirements have improved.   Consultants:   PCCM: Dr Halford Chessman 04/13/2016  Oncology:Dr Kale  Procedures:   Thoracentesis 04/19/2016 procedure aborted as 0 pleural fluid was obtained 4/15 CT angio chest >> near complete consolidation Rt lung and central Lt lung, mediastinal LAN 4/19 Bronchoscopy  4/21 CT abd/pelvis >> anasarca. No significant change 4/18 ETT >> 4/23 4/18 Lt IJ CVL >>    Antimicrobials:   IV cefepime 04/10/2016>>>> 04/12/2016  IV vancomycin 04/10/2016>>>> 04/19/2016   Subjective: Patient states she's feeling much better. Patient states shortness of breath has improved. Patient denies chest pain. Patient with occasional cough.  Objective: Filed Vitals:   04/20/16 0400 04/20/16 0500 04/20/16 0600 04/20/16 0800  BP: 143/80 130/59 144/68   Pulse: 72 109 74   Temp: 98.4 F (36.9 C)   98.4 F (36.9 C)  TempSrc:    Oral  Resp: 22 24    Height:      Weight:      SpO2: 97% 92% 95%     Intake/Output Summary (Last 24 hours) at 04/20/16 1006 Last data filed at 04/20/16 0600  Gross per 24 hour  Intake   2300 ml  Output   3585 ml  Net  -1285 ml   Filed Weights   04/17/16 0300 04/18/16 0200 04/19/16 0500  Weight: 78.7 kg (173 lb 8 oz) 77.9 kg (171 lb 11.8 oz) 72.6 kg (160 lb 0.9 oz)    Examination:  General exam: Appears calm and comfortable, on 6 L nasal cannula.  Respiratory system: Some coarse breath sounds. Minimal expiratory wheezing. Decreased breath sounds in the bases.  Cardiovascular system: S1 & S2 heard, RRR. No JVD, murmurs, rubs, gallops or clicks. Trace -1 + BLE edema.   Gastrointestinal system: Abdomen is mildly distended, soft and nontender. No organomegaly or masses felt. Normal bowel sounds heard. Central nervous system: Alert and  oriented. No focal neurological deficits. Extremities: Symmetric 5 x 5 power. Skin: No rashes, lesions or ulcers Psychiatry: Judgement and insight appear normal. Mood & affect appropriate.     Data Reviewed: I have personally reviewed following labs and imaging studies  CBC:  Recent Labs Lab 04/14/16 0900 04/16/16 0518 04/17/16 0350 04/18/16 0455 04/20/16 0430  WBC 10.0 16.6* 15.3* 9.3 48.3*  NEUTROABS 8.5*  --   --   --   --   HGB 11.0* 9.6* 8.6* 8.7* 9.1*  HCT 34.6* 30.5* 27.2* 27.9* 28.2*  MCV 93.0 95.9 94.8 95.9 90.4  PLT 518* 512* 563* 494* [id number]*   Basic Metabolic Panel:  Recent Labs Lab 04/14/16 0600  04/16/16 0350 04/17/16 0350 04/17/16 1500 04/18/16 0455 04/19/16 1100 04/20/16 0430  NA 136  < > 143 142 141 143 139 142  K 4.5  < > 5.0 4.7 4.5 3.6 3.5 3.4*  CL 100*  < > 99* 102 98* 99* 94* 93*  CO2 33*  < > 35* 33* 35* 39* 39* 41*  GLUCOSE 115*  < > 144* 139* 125* 117* 128* 92  BUN 6  < > 14 18 20  21* 15 11  CREATININE 0.50  < > 0.46 0.48 0.44 0.36* <0.30* 0.32*  CALCIUM 7.7*  < > 8.4* 8.3* 8.8* 8.5* 8.3* 8.3*  MG 1.7  --  1.9  --   --   --   --   --   PHOS 2.0*  --  3.4  --   --   --   --   --   < > =  values in this interval not displayed. GFR: Estimated Creatinine Clearance: 104.9 mL/min (by C-G formula based on Cr of 0.32). Liver Function Tests:  Recent Labs Lab 04/14/16 0600 04/19/16 1100  AST 10* 22  ALT 8* 22  ALKPHOS 65 64  BILITOT 0.1* 0.5  PROT 3.8* 4.7*  ALBUMIN 1.5* 1.8*   No results for input(s): LIPASE, AMYLASE in the last 168 hours. No results for input(s): AMMONIA in the last 168 hours. Coagulation Profile: No results for input(s): INR, PROTIME in the last 168 hours. Cardiac Enzymes: No results for input(s): CKTOTAL, CKMB, CKMBINDEX, TROPONINI in the last 168 hours. BNP (last 3 results) No results for input(s): PROBNP in the last 8760 hours. HbA1C: No results for input(s): HGBA1C in the last 72 hours. CBG:  Recent Labs Lab  04/19/16 1545 04/19/16 2006 04/20/16 0042 04/20/16 0507 04/20/16 0723  GLUCAP 122* 173* 112* 86 93   Lipid Profile:  Recent Labs  04/19/16 0813  TRIG 150*   Thyroid Function Tests: No results for input(s): TSH, T4TOTAL, FREET4, T3FREE, THYROIDAB in the last 72 hours. Anemia Panel: No results for input(s): VITAMINB12, FOLATE, FERRITIN, TIBC, IRON, RETICCTPCT in the last 72 hours. Urine analysis:    Component Value Date/Time   COLORURINE AMBER BIOCHEMICALS MAY BE AFFECTED BY COLOR* 08/01/2009 1305   APPEARANCEUR CLEAR 08/01/2009 1305   LABSPEC 1.029 08/01/2009 1305   PHURINE 6.5 08/01/2009 1305   GLUCOSEU NEGATIVE 08/01/2009 1305   HGBUR NEGATIVE 08/01/2009 1305   BILIRUBINUR SMALL* 08/01/2009 1305   KETONESUR 15* 08/01/2009 1305   PROTEINUR NEGATIVE 08/01/2009 1305   UROBILINOGEN 1.0 08/01/2009 1305   NITRITE NEGATIVE 08/01/2009 1305   LEUKOCYTESUR SMALL* 08/01/2009 1305   Sepsis Labs: @LABRCNTIP (procalcitonin:4,lacticidven:4)  ) Recent Results (from the past 240 hour(s))  MRSA PCR Screening     Status: Abnormal   Collection Time: 04/11/16 12:56 AM  Result Value Ref Range Status   MRSA by PCR POSITIVE (A) NEGATIVE Final    Comment:        The GeneXpert MRSA Assay (FDA approved for NASAL specimens only), is one component of a comprehensive MRSA colonization surveillance program. It is not intended to diagnose MRSA infection nor to guide or monitor treatment for MRSA infections. RESULT CALLED TO, READ BACK BY AND VERIFIED WITH: Lottie Dawson RN [id number] 04/11/16 A NAVARRO   Culture, blood (Routine X 2) w Reflex to ID Panel     Status: None   Collection Time: 04/11/16  2:45 PM  Result Value Ref Range Status   Specimen Description LEFT ANTECUBITAL  Final   Special Requests BOTTLES DRAWN AEROBIC AND ANAEROBIC 10CC  Final   Culture   Final    NO GROWTH 5 DAYS Performed at [facility]    Report Status 04/16/2016 FINAL  Final  Culture, blood (Routine X 2) w  Reflex to ID Panel     Status: None   Collection Time: 04/11/16  2:52 PM  Result Value Ref Range Status   Specimen Description BLOOD LEFT ARM  Final   Special Requests BOTTLES DRAWN AEROBIC AND ANAEROBIC 5CC  Final   Culture   Final    NO GROWTH 5 DAYS Performed at [facility]    Report Status 04/16/2016 FINAL  Final  Culture, respiratory (NON-Expectorated)     Status: None   Collection Time: 04/13/16  8:24 AM  Result Value Ref Range Status   Specimen Description ENDOTRACHEAL  Final   Special Requests NONE  Final   Gram Stain  Final    FEW WBC PRESENT,BOTH PMN AND MONONUCLEAR NO SQUAMOUS EPITHELIAL CELLS SEEN NO ORGANISMS SEEN Performed at [facility]    Culture   Final    NO GROWTH 2 DAYS Performed at [facility]    Report Status 04/16/2016 FINAL  Final  Culture, bal-quantitative     Status: None   Collection Time: 04/14/16  1:03 PM  Result Value Ref Range Status   Specimen Description BRONCHIAL ALVEOLAR LAVAGE  Final   Special Requests NONE  Final   Gram Stain   Final    FEW WBC PRESENT,BOTH PMN AND MONONUCLEAR NO SQUAMOUS EPITHELIAL CELLS SEEN NO ORGANISMS SEEN Performed at [facility]    Culture   Final    NO GROWTH 2 DAYS Performed at [facility]    Report Status 04/17/2016 FINAL  Final  Acid Fast Smear (AFB)     Status: None   Collection Time: 04/14/16  1:03 PM  Result Value Ref Range Status   AFB Specimen Processing Concentration  Final   Acid Fast Smear Negative  Final    Comment: (NOTE) Performed At: [facility] [address] [place], [place] [id number] Lindon Romp MD [id number]    Source (AFB) BRONCHIAL ALVEOLAR LAVAGE  Final  Fungus Culture With Stain     Status: None (Preliminary result)   Collection Time: 04/14/16  1:03 PM  Result Value Ref Range Status   Fungus Stain Final report  Final    Comment: (NOTE) Performed At: [facility] [place], [place]  [id number] Lindon Romp MD [id number]    Fungus (Mycology) Culture PENDING  Incomplete   Fungal Source BRONCHIAL ALVEOLAR LAVAGE  Final  Fungus Culture Result     Status: None   Collection Time: 04/14/16  1:03 PM  Result Value Ref Range Status   Result 1 Comment  Final    Comment: (NOTE) KOH/Calcofluor preparation:  no fungus observed. Performed At: [facility] [place], [place] [id number] Lindon Romp MD [id number]          Radiology Studies: Dg Chest 2 View  04/20/2016  CLINICAL DATA:  Shortness of breath, cough, dizziness.  Lymphoma. EXAM: CHEST  2 VIEW COMPARISON:  04/19/2016 FINDINGS: Dense consolidation throughout much of the right lung. Patchy nodular opacities in the mid and lower lung. Mild cardiomegaly. No visible effusions. No acute bony abnormality. Left central line tip in the SVC, unchanged. IMPRESSION: Extensive bilateral nodular and masslike airspace disease, right much greater than left. No real change since prior study. This presumably represents multifocal pneumonia Electronically Signed   By: Rolm Baptise M.D.   On: 04/20/2016 09:49   Dg Chest Port 1 View  04/19/2016  CLINICAL DATA:  Unsuccessful thoracentesis EXAM: PORTABLE CHEST 1 VIEW COMPARISON:  04/17/2016 FINDINGS: Endotracheal and NG tubes have been removed. Left jugular central venous catheter is stable with its tip at the upper SVC. Extensive bilateral consolidation of right groin left has improved. There is no evidence of pneumothorax. IMPRESSION: No evidence of pneumothorax Bilateral airspace disease right greater than left has improved Extubated. Electronically Signed   By: Marybelle Killings M.D.   On: 04/19/2016 14:36        Scheduled Meds: . antiseptic oral rinse  7 mL Mouth Rinse BID  . budesonide (PULMICORT) nebulizer solution  0.5 mg Nebulization BID  . enoxaparin (LOVENOX) injection  40 mg Subcutaneous QHS  . furosemide  40 mg Intravenous Q12H  .  insulin  aspart  0-15 Units Subcutaneous 6 times per day  . ipratropium-albuterol  3 mL Nebulization QID  . pantoprazole  40 mg Oral Daily  . Tbo-Filgrastim  480 mcg Subcutaneous q1800   Continuous Infusions: . dextrose 5 % and 0.9% NaCl 50 mL/hr at 04/19/16 1900     LOS: 10 days    Time spent: 41 mins    Laurali Goddard, MD [facility] Pager [phone]  If 7PM-7AM, please contact night-coverage [email or web address] Password [email or web address] 04/20/2016, 10:06 AM

## 2016-04-21 ENCOUNTER — Inpatient Hospital Stay (HOSPITAL_COMMUNITY): Payer: MEDICAID

## 2016-04-21 DIAGNOSIS — Z452 Encounter for adjustment and management of vascular access device: Secondary | ICD-10-CM

## 2016-04-21 DIAGNOSIS — Z5111 Encounter for antineoplastic chemotherapy: Secondary | ICD-10-CM | POA: Insufficient documentation

## 2016-04-21 LAB — COMPREHENSIVE METABOLIC PANEL
ALT: 17 U/L (ref 14–54)
ANION GAP: 6 (ref 5–15)
AST: 15 U/L (ref 15–41)
Albumin: 2 g/dL — ABNORMAL LOW (ref 3.5–5.0)
Alkaline Phosphatase: 83 U/L (ref 38–126)
BUN: 7 mg/dL (ref 6–20)
CALCIUM: 8.1 mg/dL — AB (ref 8.9–10.3)
CHLORIDE: 93 mmol/L — AB (ref 101–111)
CO2: 41 mmol/L — AB (ref 22–32)
Creatinine, Ser: <0.3 mg/dL — ABNORMAL LOW (ref 0.44–1.00)
Glucose, Bld: 77 mg/dL (ref 65–99)
Potassium: 3.6 mmol/L (ref 3.5–5.1)
SODIUM: 140 mmol/L (ref 135–145)
Total Bilirubin: 0.3 mg/dL (ref 0.3–1.2)
Total Protein: 4.7 g/dL — ABNORMAL LOW (ref 6.5–8.1)

## 2016-04-21 LAB — CBC WITH DIFFERENTIAL/PLATELET
BASOS PCT: 0 %
Basophils Absolute: 0 10*3/uL (ref 0.0–0.1)
EOS ABS: 0 10*3/uL (ref 0.0–0.7)
EOS PCT: 0 %
HCT: 28.5 % — ABNORMAL LOW (ref 36.0–46.0)
HEMOGLOBIN: 9.3 g/dL — AB (ref 12.0–15.0)
LYMPHS PCT: 0 %
Lymphs Abs: 0 10*3/uL — ABNORMAL LOW (ref 0.7–4.0)
MCH: 30.4 pg (ref 26.0–34.0)
MCHC: 32.6 g/dL (ref 30.0–36.0)
MCV: 93.1 fL (ref 78.0–100.0)
Monocytes Absolute: 0 10*3/uL — ABNORMAL LOW (ref 0.1–1.0)
Monocytes Relative: 0 %
NEUTROS PCT: 100 %
Neutro Abs: 50 10*3/uL — ABNORMAL HIGH (ref 1.7–7.7)
Platelets: 556 10*3/uL — ABNORMAL HIGH (ref 150–400)
RBC: 3.06 MIL/uL — AB (ref 3.87–5.11)
RDW: 14.3 % (ref 11.5–15.5)
WBC: 50 10*3/uL — AB (ref 4.0–10.5)

## 2016-04-21 LAB — GLUCOSE, CAPILLARY
GLUCOSE-CAPILLARY: 78 mg/dL (ref 65–99)
Glucose-Capillary: 131 mg/dL — ABNORMAL HIGH (ref 65–99)
Glucose-Capillary: 77 mg/dL (ref 65–99)
Glucose-Capillary: 86 mg/dL (ref 65–99)
Glucose-Capillary: 124 mg/dL — ABNORMAL HIGH (ref 65–99)
Glucose-Capillary: 144 mg/dL — ABNORMAL HIGH (ref 65–99)

## 2016-04-21 LAB — MAGNESIUM: MAGNESIUM: 1.7 mg/dL (ref 1.7–2.4)

## 2016-04-21 MED ORDER — POTASSIUM CHLORIDE CRYS ER 20 MEQ PO TBCR
60.0000 meq | EXTENDED_RELEASE_TABLET | Freq: Once | ORAL | Status: AC
  Administered 2016-04-21: 60 meq via ORAL
  Filled 2016-04-21: qty 3

## 2016-04-21 NOTE — Progress Notes (Signed)
PROGRESS NOTE   Heidi [name]  [id number] DOB: 24-Jan-1986 DOA: 04/10/2016 PCP: No primary care provider on file.  Outpatient Specialists: Oncology: Dr Irene Limbo   Subjective: Denies any new complaints, still needs significant oxygen.  Brief Narrative:  30 yo female was admitted in March 2017 with respiratory failure from pneumonia and ARDS. She was found to have diffuse adenopathy >> Rt axillary bx showed Hodgkin's lymphoma. She was set up for outpt follow up with oncology. On 4/15 she presented with worsening dyspnea. CXR and CT chest showed progressive Rt >> Lt ASD. She was started on Abx for HCAP and supplemental oxygen. On 4/18 she developed worsening mental status. This was associated with progressive hypoxia and hypercapnia with respiratory acidosis. She was tried on BiPAP w/o improvement and required intubation. Patient was subsequently transferred to [facility] service. Patient finished a ten-day course of antibiotic treatment and underwent bronchoscopy with biopsies and BAL. Biopsies consistent with mixed cellularity Hodgkin's lymphoma. Oncology was consulted and followed the patient throughout the hospitalization. Patient was started on chemotherapy per oncology. Patient subsequently extubated 04/18/2016 and currently on 6 L nasal cannula as well as bronchodilators. Patient transferred to medicine service 04/20/2016.  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  Patient status post 10 days antibiotics for probable HCAP. HIV negative. Hep C and hep B serologies negative.  Patient has been started on chemotherapy treatment as well as daily Granix per oncology.  Continue current treatment  Acute respiratory failure with hypoxia secondary to Hodgkin's lymphoma and questionable HCAP Status post vent-dependent respiratory failure. Patient was extubated and currently on 6 L nasal cannula.  Patient status post 10 days antibiotic treatment for HCAP.  BAL cultures negative to date. Continue Pulmicort, DuoNeb nebs, IV Lasix. Placed on Hycodan as needed. Follow.  HCAP Status post bronchoscopy 04/14/2016 with BA cultures with no growth to date.  Blood cultures negative 2. Urine strep pneumococcus antigen is negative.  Patient now on 6 L nasal cannula. Status post 10 days of antibiotics treatment, still needs about 5-6 L of oxygen.  Hypokalemia Likely secondary to diuretics. Replete.  Volume overload CT abdomen and pelvis with persistent effusions, infiltrates and pulmonary nodules, diffuse body wall edema suggesting anasarca and moderate free pelvic fluid. Continue IV Lasix and monitor renal function closely. Follow.  Leukocytosis Likely secondary to problem #1 and Neupogen, per oncology continue the planned 5 doses.  Abdominal distention CT abdomen and pelvis with mesenteric and retroperitoneal lymphadenopathy as well as suggesting anasarca. Patient on IV Lasix. Patient receiving chemotherapy. Follow.  Protein calorie malnutrition Patient started on a diet. PPI.  Acute metabolic encephalopathy Likely secondary to problem #1 and 2. Improved.  Possible right pleural effusion Status post thoracentesis 04/19/2016 which was aborted as 0 pleural fluid was obtained. Follow.   DVT prophylaxis: Lovenox Code Status: Full Family Communication: Updated  patient. No family at bedside. Disposition Plan: Remain the step down unit. Transfer  to MedSurg once O2 requirements have improved.   Consultants:   PCCM: Dr Halford Chessman 04/13/2016  Oncology:Dr Kale  Procedures:   Thoracentesis 04/19/2016 procedure aborted as 0 pleural fluid was obtained 4/15 CT angio chest >> near complete consolidation Rt lung and central Lt lung, mediastinal LAN 4/19 Bronchoscopy  4/21 CT abd/pelvis >> anasarca. No significant change 4/18 ETT >> 4/23 4/18 Lt IJ CVL >>    Antimicrobials:   IV cefepime 04/10/2016>>>> 04/12/2016  IV vancomycin 04/10/2016>>>> 04/19/2016   Objective: Filed Vitals:   04/21/16 0734 04/21/16 0800 04/21/16 0911 04/21/16 0948  BP: 133/82   152/68  Pulse: 96  116 120  Temp:  98 F (36.7 C)    TempSrc:  Oral    Resp: 20  25 36  Height:      Weight:      SpO2: 93%  92% 87%    Intake/Output Summary (Last 24 hours) at 04/21/16 1028 Last data filed at 04/21/16 0911  Gross per 24 hour  Intake    120 ml  Output   2575 ml  Net  -2455 ml   Filed Weights   04/17/16 0300 04/18/16 0200 04/19/16 0500  Weight: 78.7 kg (173 lb 8 oz) 77.9 kg (171 lb 11.8 oz) 72.6 kg (160 lb 0.9 oz)    Examination:  General exam: Appears calm and comfortable, on 6 L nasal cannula.  Respiratory system: Some coarse breath sounds. Minimal expiratory wheezing. Decreased breath sounds in the bases.  Cardiovascular system: S1 & S2 heard, RRR. No JVD, murmurs, rubs, gallops or clicks. Trace -1 + BLE edema.   Gastrointestinal system: Abdomen is mildly distended, soft and nontender. No organomegaly or masses felt. Normal bowel sounds heard. Central nervous system: Alert and oriented. No focal neurological deficits. Extremities: Symmetric 5 x 5 power. Skin: No rashes, lesions or ulcers Psychiatry: Judgement and insight appear normal. Mood & affect appropriate.     Data Reviewed: I have personally reviewed following labs and imaging studies  CBC:  Recent  Labs Lab 04/16/16 0518 04/17/16 0350 04/18/16 0455 04/20/16 0430 04/21/16 0455  WBC 16.6* 15.3* 9.3 48.3* 50.0*  NEUTROABS  --   --   --   --  50.0*  HGB 9.6* 8.6* 8.7* 9.1* 9.3*  HCT 30.5* 27.2* 27.9* 28.2* 28.5*  MCV 95.9 94.8 95.9 90.4 93.1  PLT 512* 563* 494* 534* [id number]*   Basic Metabolic Panel:  Recent Labs Lab 04/16/16 0350  04/17/16 1500 04/18/16 0455 04/19/16 1100 04/20/16 0430 04/21/16 0455  NA 143  < > 141 143 139 142 140  K 5.0  < > 4.5 3.6 3.5 3.4* 3.6  CL 99*  < > 98* 99* 94* 93* 93*  CO2 35*  < > 35* 39* 39* 41* 41*  GLUCOSE 144*  < > 125* 117* 128* 92 77  BUN 14  < > 20 21* 15 11 7   CREATININE 0.46  < > 0.44 0.36* <0.30* 0.32* <0.30*  CALCIUM 8.4*  < > 8.8* 8.5* 8.3* 8.3* 8.1*  MG 1.9  --   --   --   --   --  1.7  PHOS 3.4  --   --   --   --   --   --   < > = values in this interval not displayed. GFR: CrCl cannot be calculated (Patient has no serum creatinine result on file.). Liver Function Tests:  Recent Labs Lab 04/19/16 1100 04/21/16 0455  AST 22 15  ALT 22 17  ALKPHOS 64 83  BILITOT 0.5 0.3  PROT 4.7* 4.7*  ALBUMIN 1.8* 2.0*   No results for input(s): LIPASE, AMYLASE in the last 168 hours. No results for input(s): AMMONIA in the last 168 hours. Coagulation Profile: No results for input(s): INR, PROTIME in the last 168 hours. Cardiac Enzymes: No results for input(s): CKTOTAL, CKMB, CKMBINDEX, TROPONINI in the last 168 hours. BNP (last 3 results) No results for input(s): PROBNP in the last 8760 hours. HbA1C: No results for input(s): HGBA1C in the last 72 hours. CBG:  Recent Labs Lab 04/20/16 1540 04/20/16 1950 04/21/16 0014 04/21/16 0415 04/21/16 0752  GLUCAP 155* 179* 131* 77 86   Lipid Profile:  Recent Labs  04/19/16 0813  TRIG 150*   Thyroid Function Tests: No results for input(s): TSH, T4TOTAL, FREET4, T3FREE, THYROIDAB in the last 72 hours. Anemia Panel: No results for input(s): VITAMINB12, FOLATE, FERRITIN, TIBC,  IRON, RETICCTPCT in the last 72 hours. Urine analysis:    Component Value Date/Time   COLORURINE AMBER BIOCHEMICALS MAY BE AFFECTED BY COLOR* 08/01/2009 1305   APPEARANCEUR CLEAR 08/01/2009 1305   LABSPEC 1.029 08/01/2009 1305   PHURINE 6.5 08/01/2009 1305   GLUCOSEU NEGATIVE 08/01/2009 1305   HGBUR NEGATIVE 08/01/2009 1305   BILIRUBINUR SMALL* 08/01/2009 1305   KETONESUR 15* 08/01/2009 1305   PROTEINUR NEGATIVE 08/01/2009 1305   UROBILINOGEN 1.0 08/01/2009 1305   NITRITE NEGATIVE 08/01/2009 1305   LEUKOCYTESUR SMALL* 08/01/2009 1305   Sepsis Labs: @LABRCNTIP (procalcitonin:4,lacticidven:4)  ) Recent Results (from the past 240 hour(s))  Culture, blood (Routine X 2) w Reflex to ID Panel     Status: None   Collection Time: 04/11/16  2:45 PM  Result Value Ref Range Status   Specimen Description LEFT ANTECUBITAL  Final   Special Requests BOTTLES DRAWN AEROBIC AND ANAEROBIC 10CC  Final   Culture   Final    NO GROWTH 5 DAYS Performed at [facility]    Report Status 04/16/2016 FINAL  Final  Culture, blood (Routine X 2) w Reflex to ID Panel     Status: None   Collection Time: 04/11/16  2:52 PM  Result Value Ref Range Status   Specimen Description BLOOD LEFT ARM  Final   Special Requests BOTTLES DRAWN AEROBIC AND ANAEROBIC 5CC  Final   Culture   Final    NO GROWTH 5 DAYS Performed at [facility]    Report Status 04/16/2016 FINAL  Final  Culture, respiratory (NON-Expectorated)     Status: None   Collection Time: 04/13/16  8:24 AM  Result Value Ref Range Status   Specimen Description ENDOTRACHEAL  Final   Special Requests NONE  Final   Gram Stain   Final    FEW WBC PRESENT,BOTH PMN AND MONONUCLEAR NO SQUAMOUS EPITHELIAL CELLS SEEN NO ORGANISMS SEEN Performed at [facility]    Culture   Final    NO GROWTH 2 DAYS Performed at [facility]    Report Status 04/16/2016 FINAL  Final  Culture, bal-quantitative     Status: None   Collection  Time: 04/14/16  1:03 PM  Result Value Ref Range Status   Specimen Description BRONCHIAL ALVEOLAR LAVAGE  Final   Special Requests NONE  Final   Gram Stain   Final    FEW WBC PRESENT,BOTH PMN AND MONONUCLEAR NO SQUAMOUS EPITHELIAL CELLS SEEN NO ORGANISMS SEEN Performed at [facility]    Culture   Final    NO GROWTH 2 DAYS Performed at  [facility]    Report Status 04/17/2016 FINAL  Final  Acid Fast Smear (AFB)     Status: None   Collection Time: 04/14/16  1:03 PM  Result Value Ref Range Status   AFB Specimen Processing Concentration  Final   Acid Fast Smear Negative  Final    Comment: (NOTE) Performed At: [facility] [address] [place], [place] [id number] Lindon Romp MD [id number]    Source (AFB) BRONCHIAL ALVEOLAR LAVAGE  Final  Fungus Culture With Stain     Status: None (Preliminary result)   Collection Time: 04/14/16  1:03 PM  Result Value Ref Range Status   Fungus Stain Final report  Final    Comment: (NOTE) Performed At: [facility] [place], [place] [id number] Lindon Romp MD [id number]    Fungus (Mycology) Culture PENDING  Incomplete   Fungal Source BRONCHIAL ALVEOLAR LAVAGE  Final  Fungus Culture Result     Status: None   Collection Time: 04/14/16  1:03 PM  Result Value Ref Range Status   Result 1 Comment  Final    Comment: (NOTE) KOH/Calcofluor preparation:  no fungus observed. Performed At: [facility] [place], [place] [id number] Lindon Romp MD [id number]          Radiology Studies: Dg Chest 2 View  04/20/2016  CLINICAL DATA:  Shortness of breath, cough, dizziness.  Lymphoma. EXAM: CHEST  2 VIEW COMPARISON:  04/19/2016 FINDINGS: Dense consolidation throughout much of the right lung. Patchy nodular opacities in the mid and lower lung. Mild cardiomegaly. No visible effusions. No acute bony abnormality. Left central line tip in the SVC, unchanged.  IMPRESSION: Extensive bilateral nodular and masslike airspace disease, right much greater than left. No real change since prior study. This presumably represents multifocal pneumonia Electronically Signed   By: Rolm Baptise M.D.   On: 04/20/2016 09:49   Dg Chest Port 1 View  04/19/2016  CLINICAL DATA:  Unsuccessful thoracentesis EXAM: PORTABLE CHEST 1 VIEW COMPARISON:  04/17/2016 FINDINGS: Endotracheal and NG tubes have been removed. Left jugular central venous catheter is stable with its tip at the upper SVC. Extensive bilateral consolidation of right groin left has improved. There is no evidence of pneumothorax. IMPRESSION: No evidence of pneumothorax Bilateral airspace disease right greater than left has improved Extubated. Electronically Signed   By: Marybelle Killings M.D.   On: 04/19/2016 14:36        Scheduled Meds: . antiseptic oral rinse  7 mL Mouth Rinse BID  . budesonide (PULMICORT) nebulizer solution  0.5 mg Nebulization BID  . enoxaparin (LOVENOX) injection  40 mg Subcutaneous QHS  . furosemide  40 mg Intravenous Q12H  . insulin aspart  0-15 Units Subcutaneous 6 times per day  . ipratropium-albuterol  3 mL Nebulization QID  . pantoprazole  40 mg Oral Daily  . Tbo-Filgrastim  480 mcg Subcutaneous q1800   Continuous Infusions: . dextrose 5 % and 0.9% NaCl 50 mL/hr at 04/19/16 1900     LOS: 11 days    Time spent: 45 mins    Julez Huseby A, MD [facility] Pager [phone]  If 7PM-7AM, please contact night-coverage [email or web address] Password [facility] 04/21/2016, 10:28 AM

## 2016-04-21 NOTE — Progress Notes (Signed)
Occupational Therapy Evaluation Patient Details Name: Heidi [name] MRN: [id number] DOB: 1986/04/25 Today's Date: 04/21/2016    History of Present Illness 30 yo female admitted with acute respiratory failure. Extubated 4/23. Hx of lymphoma   Clinical Impression   Patient presents to OT with decreased ADL independence/safety. Will benefit from skilled OT to maximize function. Recommend 24/7 supervision/assistance at home at this time. Will follow.    Follow Up Recommendations  Supervision/Assistance - 24 hour;No OT follow up    Equipment Recommendations  None recommended by OT    Recommendations for Other Services       Precautions / Restrictions Precautions Precautions: Fall Precaution Comments: Monitor HR, O2 sats Restrictions Weight Bearing Restrictions: No      Mobility Bed Mobility Overal bed mobility: Needs Assistance Bed Mobility: Supine to Sit     Supine to sit: Supervision     General bed mobility comments: for safety, lines  Transfers Overall transfer level: Needs assistance Equipment used: None Transfers: Sit to/from Stand Sit to Stand: Supervision         General transfer comment: for safety, lines    Balance                                         ADL Overall ADL's : Needs assistance/impaired Eating/Feeding: Independent;Sitting   Grooming: Set up;Sitting               Lower Body Dressing: Min guard Lower Body Dressing Details (indicate cue type and reason): slide into sandals in standing             Functional mobility during ADLs: Min assistance;+2 for safety/equipment       Vision     Perception     Praxis      Pertinent Vitals/Pain Pain Assessment: No/denies pain     Hand Dominance     Extremity/Trunk Assessment Upper Extremity Assessment Upper Extremity Assessment: Generalized weakness   Lower Extremity Assessment Lower Extremity Assessment: Defer to PT evaluation   Cervical /  Trunk Assessment Cervical / Trunk Assessment: Normal   Communication Communication Communication: No difficulties   Cognition Arousal/Alertness: Awake/alert Behavior During Therapy: WFL for tasks assessed/performed Overall Cognitive Status: Within Functional Limits for tasks assessed                     General Comments    HR 133 bp, O2 sats 90% on 6L during ambulation.    Exercises       Shoulder Instructions      Home Living Family/patient expects to be discharged to:: Private residence Living Arrangements: Alone Available Help at Discharge: Family Type of Home: Apartment Home Access: Stairs to enter [employer] of Steps: 3 in back, 1 in front   Home Layout: Two level;Bed/bath upstairs Alternate Level Stairs-Number of Steps: flight   Bathroom Shower/Tub: Teacher, Heidi [name] years/pre: Standard     Home Equipment: None          Prior Functioning/Environment Level of Independence: Independent             OT Diagnosis: Generalized weakness   OT Problem List: Decreased strength;Decreased activity tolerance;Impaired balance (sitting and/or standing);Decreased knowledge of use of DME or AE;Cardiopulmonary status limiting activity   OT Treatment/Interventions: Self-care/ADL training;DME and/or AE instruction;Therapeutic activities;Patient/family education    OT Goals(Current goals can be found in the care plan section)  Acute Rehab OT Goals Patient Stated Goal: regain PLOF OT Goal Formulation: With patient Time For Goal Achievement: 05/05/16 Potential to Achieve Goals: Good ADL Goals Pt Will Perform Upper Body Bathing: with set-up;sitting Pt Will Perform Lower Body Bathing: with supervision;sit to/from stand Pt Will Perform Upper Body Dressing: with set-up;sitting Pt Will Perform Lower Body Dressing: with supervision;sit to/from stand Pt Will Transfer to Toilet: with supervision;ambulating;regular height toilet Pt Will Perform  Toileting - Clothing Manipulation and hygiene: with supervision;sit to/from stand Pt Will Perform Tub/Shower Transfer: Tub transfer;with supervision;ambulating  OT Frequency: Min 2X/week   Barriers to D/C:            Co-evaluation PT/OT/SLP Co-Evaluation/Treatment: Yes Reason for Co-Treatment: For patient/therapist safety PT goals addressed during session: Mobility/safety with mobility OT goals addressed during session: ADL's and self-care      End of Session Equipment Utilized During Treatment: Oxygen Nurse Communication: Mobility status  Activity Tolerance: Patient tolerated treatment well Patient left: in chair;with call bell/phone within reach   Time: [id number] OT Time Calculation (min): 16 min Charges:  OT General Charges $OT Visit: 1 Procedure OT Evaluation $OT Eval Low Complexity: 1 Procedure G-Codes:    Heidi [name] A 18-May-2016, 12:47 PM

## 2016-04-21 NOTE — Evaluation (Signed)
Physical Therapy Evaluation Patient Details Name: Heidi [name] MRN: [id number] DOB: [birth year] Today's Date: 04/21/2016   History of Present Illness  30 yo female admitted with acute respiratory failure. Extubated 4/23. Hx of lymphoma  Clinical Impression  On eval, pt required Min assist +2 for safety/lines. Pt walked ~125 feet. HR up to 133 bpm, O2 sats 90% on 6L. Pt tolerated activity fairly well. Will follow and progress as able. Recommend 24 hour supervision/assist for now. May need home safety eval/HHPT.     Follow Up Recommendations Supervision/Assistance - 24 hour    Equipment Recommendations  None recommended by PT    Recommendations for Other Services       Precautions / Restrictions Precautions Precautions: Fall Precaution Comments: Monitor HR, O2 sats Restrictions Weight Bearing Restrictions: No      Mobility  Bed Mobility Overal bed mobility: Needs Assistance Bed Mobility: Supine to Sit     Supine to sit: Supervision     General bed mobility comments: for safety, lines  Transfers Overall transfer level: Needs assistance   Transfers: Sit to/from Stand Sit to Stand: Supervision         General transfer comment: for safety, lines  Ambulation/Gait Ambulation/Gait assistance: Min assist;+2 safety/equipment Ambulation Distance (Feet): 125 Feet Assistive device: None Gait Pattern/deviations: Step-through pattern;Drifts right/left;Staggering right;Staggering left     General Gait Details: Assist need to stabilize. Pt tolerated distance fairly well. HR 133 bp, O2 sats 90% on 6L.   Stairs            Wheelchair Mobility    Modified Rankin (Stroke Patients Only)       Balance Overall balance assessment: Needs assistance         Standing balance support: During functional activity Standing balance-Leahy Scale: Fair                               Pertinent Vitals/Pain Pain Assessment: No/denies pain    Home Living  Family/patient expects to be discharged to:: Private residence Living Arrangements: Alone Available Help at Discharge: Family Type of Home: Apartment Home Access: Stairs to enter   [employer] of Steps: 3 in back, 1 in front Home Layout: Two level;Bed/bath upstairs Home Equipment: None      Prior Function Level of Independence: Independent               Hand Dominance        Extremity/Trunk Assessment   Upper Extremity Assessment: Defer to OT evaluation           Lower Extremity Assessment: Generalized weakness      Cervical / Trunk Assessment: Normal  Communication   Communication: No difficulties  Cognition Arousal/Alertness: Awake/alert Behavior During Therapy: WFL for tasks assessed/performed Overall Cognitive Status: Within Functional Limits for tasks assessed                      General Comments      Exercises        Assessment/Plan    PT Assessment Patient needs continued PT services  PT Diagnosis Difficulty walking;Generalized weakness   PT Problem List Decreased strength;Decreased activity tolerance;Decreased balance;Decreased mobility;Cardiopulmonary status limiting activity  PT Treatment Interventions Gait training;Functional mobility training;Therapeutic activities;Patient/family education;Balance training;Therapeutic exercise   PT Goals (Current goals can be found in the Care Plan section) Acute Rehab PT Goals Patient Stated Goal: regain PLOF PT Goal Formulation: With patient Time For Goal Achievement:  05/05/16 Potential to Achieve Goals: Good    Frequency Min 3X/week   Barriers to discharge        Co-evaluation               End of Session Equipment Utilized During Treatment: Oxygen Activity Tolerance: Patient tolerated treatment well Patient left: in chair;with call bell/phone within reach           Time: 1135-1153 PT Time Calculation (min) (ACUTE ONLY): 18 min   Charges:   PT  Evaluation $PT Eval Low Complexity: 1 Procedure     PT G Codes:        Weston Anna, MPT Pager: [phone]

## 2016-04-21 NOTE — Progress Notes (Signed)
Heidi [name]   HEMATOLOGY/ONCOLOGY INPATIENT PROGRESS NOTE  Date of Service: 04/21/2016 Inpatient Attending: .Verlee Monte, MD  SUBJECTIVE  Patient was seen in the ICU this morning. Notes that her breathing continues to improve. She is down to 3 L of oxygen by nasal cannula. She notes that the ICU team told her that she will probably transfer to the ICU today and she is looking forward to that. Eating better. He feels motivated to get up and about. Is looking forward to getting better and going home to spend time with her children.  Mild tachycardia noted. No significant other acute new concerns. No fevers or chills. WBC counts are elevated yesterday and today due to her Neupogen shots. We talked about the fact that we expect her to nadir around Friday or over the weekend and so we'll continue and complete the 5 days of Neupogen despite the elevated counts.    OBJECTIVE:  PHYSICAL EXAMINATION: . Filed Vitals:   04/21/16 0500 04/21/16 0600 04/21/16 0800 04/21/16 0911  BP:      Pulse: 95 98  116  Temp:  98.6 F (37 C) 98 F (36.7 C)   TempSrc:  Oral Oral   Resp: 22 27  25   Height:      Weight:      SpO2: 97% 96%  92%   Filed Weights   04/17/16 0300 04/18/16 0200 04/19/16 0500  Weight: 173 lb 8 oz (78.7 kg) 171 lb 11.8 oz (77.9 kg) 160 lb 0.9 oz (72.6 kg)   .Body mass index is 25.85 kg/(m^2).   Heidi [name] Wt Readings from Last 3 Encounters:  04/19/16 160 lb 0.9 oz (72.6 kg)  03/23/16 128 lb 8 oz (58.287 kg)  05/15/14 153 lb 11.2 oz (69.718 kg)   GENERAL:Awake alert and oriented 3 SKIN: No acute rashes OROPHARYNX: Moist mucous membranes  NECK: supple, no JVD LYMPH:  Small cervical and axillary lymphadenopathy  LUNGS: Bilateral coarse breath sounds with bilateral wheezes, improved air entry. HEART: regular rate & rhythm ABDOMEN: abdomen soft, nontender, normoactive bowel sounds Extremities 2+ pitting pedal edema bilaterally  NEURO: Awake alert and oriented 3, moving all 4 extremities. No  overt focal neurological deficit.  MEDICAL HISTORY:  Past Medical History  Diagnosis Date  . Asthma   . Hodgkin lymphoma ([place])   . Hypertension     SURGICAL HISTORY: Past Surgical History  Procedure Laterality Date  . Axillary lymph node biopsy Right 03/19/2016    Procedure: AXILLARY LYMPH NODE BIOPSY;  Surgeon: Armandina Gemma, MD;  Location: [facility];  Service: General;  Laterality: Right;    SOCIAL HISTORY: Social History   Social History  . Marital Status: Single    Spouse Name: N/A  . Number of Children: N/A  . Years of Education: N/A   Occupational History  . Not on file.   Social History Main Topics  . Smoking status: Current Every Day Smoker -- 0.50 packs/day    Types: Cigarettes  . Smokeless tobacco: Not on file  . Alcohol Use: Yes     Comment: PT drinks 2 40 oz beer per day   . Drug Use: Not on file  . Sexual Activity: Not on file   Other Topics Concern  . Not on file   Social History Narrative    FAMILY HISTORY: No family history on file.  ALLERGIES:  has No Known Allergies.  MEDICATIONS:  Scheduled Meds: . antiseptic oral rinse  7 mL Mouth Rinse BID  . budesonide (PULMICORT) nebulizer solution  0.5 mg Nebulization BID  . enoxaparin (LOVENOX) injection  40 mg Subcutaneous QHS  . furosemide  40 mg Intravenous Q12H  . insulin aspart  0-15 Units Subcutaneous 6 times per day  . ipratropium-albuterol  3 mL Nebulization QID  . pantoprazole  40 mg Oral Daily  . Tbo-Filgrastim  480 mcg Subcutaneous q1800   Continuous Infusions: . dextrose 5 % and 0.9% NaCl 50 mL/hr at 04/19/16 1900   PRN Meds:.acetaminophen (TYLENOL) oral liquid 160 mg/5 mL, alteplase, Cold Pack, diphenhydrAMINE, fentaNYL (SUBLIMAZE) injection, heparin lock flush, heparin lock flush, HYDROcodone-homatropine, levalbuterol, midazolam, [DISCONTINUED] ondansetron **OR** ondansetron (ZOFRAN) IV, sodium chloride flush, sodium chloride flush  REVIEW OF SYSTEMS:    10 Point review of Systems was  done is negative except as noted above.   LABORATORY DATA:   . CBC Latest Ref Rng 04/21/2016 04/20/2016 04/18/2016  WBC 4.0 - 10.5 K/uL 50.0(H) 48.3(H) 9.3  Hemoglobin 12.0 - 15.0 g/dL 9.3(L) 9.1(L) 8.7(L)  Hematocrit 36.0 - 46.0 % 28.5(L) 28.2(L) 27.9(L)  Platelets 150 - 400 K/uL 556(H) 534(H) 494(H)   . CMP Latest Ref Rng 04/21/2016 04/20/2016 04/19/2016  Glucose 65 - 99 mg/dL 77 92 128(H)  BUN 6 - 20 mg/dL 7 11 15   Creatinine 0.44 - 1.00 mg/dL <0.30(L) 0.32(L) <0.30(L)  Sodium 135 - 145 mmol/L 140 142 139  Potassium 3.5 - 5.1 mmol/L 3.6 3.4(L) 3.5  Chloride 101 - 111 mmol/L 93(L) 93(L) 94(L)  CO2 22 - 32 mmol/L 41(H) 41(H) 39(H)  Calcium 8.9 - 10.3 mg/dL 8.1(L) 8.3(L) 8.3(L)  Total Protein 6.5 - 8.1 g/dL 4.7(L) - 4.7(L)  Total Bilirubin 0.3 - 1.2 mg/dL 0.3 - 0.5  Alkaline Phos 38 - 126 U/L 83 - 64  AST 15 - 41 U/L 15 - 22  ALT 14 - 54 U/L 17 - 22       Microscopic Comment LYMPHOMA Histologic type: Classical Hodgkin lymphoma, mixed cellularity type Grade (if applicable): N/A Flow cytometry: No monoclonal B cell population or abnormal T cell phenotype identified ([id number]). Immunohistochemical stains: LCA, cytokeratin AE1/AE3, S100, CD15 CD30, CD163, CD20, PAX-5, CD21, CD3, CD68, CD1a, CD43, CD4, CD5, CD8, ALK protein, CD45RO, CD79a, CD10, kappa and lambda with appropriate controls. Touch preps/imprints: Scanty but with scattered large atypical mononuclear and multilobated lymphoid appearing cells. Comments: The sections show diffuse effacement of the architecture by an atypical lymphohistiocytic process characterized associated with scattered foci of necrosis. In this background, scattered individual and small clusters of large atypical mononuclear and multilobated lymphoid appearing cells are seen displaying vesicular chromatin, variably prominent nucleoli and abundant amphophilic cytoplasm. Prominent sinusoidal pattern is not appreciated and areas of collagenous fibrosis  are not seen. Admixed are small lymphocytes, eosinophils, and plasma cells to variable extent. To further evaluate this process, flow cytometric analysis was performed but failed to show any monoclonal B cell population or abnormal T cell phenotype. A large battery of immunohistochemical stains were performed and show 1 of 2 FINAL for ANNASOFIA, POHL [phone]) Microscopic Comment(continued) that the large atypical lymphoid appearing cells are positive for CD30 and scattered cells for CD15. They are negative for CD3, CD20, LCA, PAX-5, ALK protein, CD10, CD4, CD43, CD45RO, CD5, CD8, CD79a, EMA, cytoplasmic kappa, cytoplasmic lambda. The lymphoid component in the background shows a mixture of T and B cells with predominance of T cells containing a mixture of CD4 and CD8 positive cells. The abundant histiocytic component in the background is positive for CD4, CD68 and CD163 and mostly negative for CD1a, S100 and  CD21. No significant cytokeratin (AE1/AE3) positivity is identified. The overall histologic and immunophenotypic features are consistent with classical Hodgkin lymphoma which is best subclassified as mixed cellularity-type. (BNS:kh:ecj 03-24-16) Susanne Greenhouse MD Pathologist, Electronic Signature (Case signed 03/24/2016)    RADIOGRAPHIC STUDIES: I have personally reviewed the radiological images as listed and agreed with the findings in the report. Ct Abdomen Pelvis Wo Contrast  04/16/2016  CLINICAL DATA:  Lymphoma, respiratory failure, anasarca. EXAM: CT ABDOMEN AND PELVIS WITHOUT CONTRAST TECHNIQUE: Multidetector CT imaging of the abdomen and pelvis was performed following the standard protocol without IV contrast. COMPARISON:  Chest CT 04/10/2016. FINDINGS: Lower chest: Persistent bilateral pleural effusions, bilateral infiltrates and pulmonary nodules. Hepatobiliary: No obvious hepatic lesions but study is limited without IV contrast. The gallbladder is grossly normal. Pancreas: No  obvious mass, inflammation or ductal dilatation. Spleen: Normal size.  No obvious lesions. Adrenals/Urinary Tract: The adrenal glands and kidneys are grossly normal. Stomach/Bowel: There is an NG tube in the stomach. The duodenum, small bowel and colon are grossly normal. Vascular/Lymphatic: Retroperitoneal lymphadenopathy. 2 cm retroperitoneal lymph node on image number 42. The aorta is normal in caliber. No atherosclerotic calcifications. Reproductive: The uterus and ovaries are grossly normal. Other: The bladder contains a Foley catheter. There is a moderate amount of free pelvic fluid. No inguinal adenopathy. Diffuse body wall edema suggesting anasarca. Musculoskeletal: No significant bony findings. IMPRESSION: 1. Limited examination due to lack of IV contrast, artifact from the right arm and motion. 2. Persistent effusions, infiltrates and pulmonary nodules. 3. Diffuse body wall edema suggesting anasarca. 4. Mesenteric and retroperitoneal lymphadenopathy. 5. Moderate free pelvic fluid. Electronically Signed   By: Marijo Sanes M.D.   On: 04/16/2016 11:37   Dg Chest 1 View  04/11/2016  CLINICAL DATA:  Shortness of breath.  History of lymphoma. EXAM: CHEST 1 VIEW COMPARISON:  Chest radiograph 04/10/2016; chest CT 04/10/2016 FINDINGS: Grossly unchanged diffuse opacification of the right hemi thorax with worsening opacities involving the left mid and upper lung. No definite pleural effusion or pneumothorax. Regional skeleton is unremarkable. IMPRESSION: Interval worsening opacities left upper lung with persistent near complete opacification of the right hemi thorax, findings overall compatible with extensive multi focal pneumonia. Known mediastinal and hilar adenopathy. Electronically Signed   By: Lovey Newcomer M.D.   On: 04/11/2016 17:03   Dg Chest 2 View  04/20/2016  CLINICAL DATA:  Shortness of breath, cough, dizziness.  Lymphoma. EXAM: CHEST  2 VIEW COMPARISON:  04/19/2016 FINDINGS: Dense consolidation  throughout much of the right lung. Patchy nodular opacities in the mid and lower lung. Mild cardiomegaly. No visible effusions. No acute bony abnormality. Left central line tip in the SVC, unchanged. IMPRESSION: Extensive bilateral nodular and masslike airspace disease, right much greater than left. No real change since prior study. This presumably represents multifocal pneumonia Electronically Signed   By: Rolm Baptise M.D.   On: 04/20/2016 09:49   Dg Chest 2 View  04/10/2016  CLINICAL DATA:  Patient with shortness of breath for multiple weeks. Recently diagnosed with lymphoma. EXAM: CHEST  2 VIEW COMPARISON:  Chest CT 03/16/2016; chest radiograph 03/22/2016. FINDINGS: Multiple monitoring leads overlie the patient. Stable cardiac and mediastinal contours with extensive bilateral hilar and mediastinal adenopathy. Significant worsening of consolidation within the right lung. Persistent consolidation within the left mid lung with associated small nodular opacities. Possible right pleural effusion. No pneumothorax. Regional skeleton is unremarkable. IMPRESSION: Interval worsening of consolidation throughout the majority of the right lung which may represent malignancy,  postobstructive pneumonia and/or effusion. Persistent hilar and mediastinal adenopathy. Unchanged scattered opacities and consolidation within the left lung. Electronically Signed   By: Lovey Newcomer M.D.   On: 04/10/2016 19:32   Ct Angio Chest Pe W/cm &/or Wo Cm  04/10/2016  CLINICAL DATA:  Acute onset of worsening shortness of breath. Bilateral leg swelling. Recently diagnosed with Hodgkin's lymphoma. Initial encounter. EXAM: CT ANGIOGRAPHY CHEST WITH CONTRAST TECHNIQUE: Multidetector CT imaging of the chest was performed using the standard protocol during bolus administration of intravenous contrast. Multiplanar CT image reconstructions and MIPs were obtained to evaluate the vascular anatomy. CONTRAST:  100 mL of Isovue 370 IV contrast COMPARISON:   CT of the chest performed 03/16/2016, and chest radiograph performed 04/10/2016 FINDINGS: There is no evidence of pulmonary embolus. There is near complete dense consolidation of the right lung, and central consolidation involving the left upper lobe, with additional scattered nodular opacities throughout the left lung. This is significantly worsened from the prior CT, reflecting worsening bilateral pneumonia. On correlation with right axillary lymph node biopsy results, the patient was recently diagnosed with classic Hodgkin's lymphoma. Hodgkin's lymphoma typically would not narrow the airway to the extent required for postobstructive pneumonia, and is likely unrelated to the patient's presentation with diffuse bilateral pneumonia. There is no evidence of pleural effusion or pneumothorax. Extensive enlarged mediastinal nodes are seen, particularly anteriorly, measuring up to 1.8 cm in short axis. Right axillary nodes measure up to 2.4 cm in short axis. These are compatible with the patient's Hodgkin's lymphoma. A small 3.8 cm collection of fluid at the right axilla likely reflects a postoperative seroma, status post recent right hilar lymph node biopsy. No pericardial effusion is identified. The great vessels are grossly unremarkable in appearance. The visualized portions of the thyroid gland are unremarkable in appearance. The visualized portions of the liver and spleen are unremarkable. No acute osseous abnormalities are seen. Review of the MIP images confirms the above findings. IMPRESSION: 1. No evidence of pulmonary embolus. 2. Near complete dense consolidation of the right lung, and central consolidation involving the left upper lobe, with scattered nodular opacities throughout the left lung. This is significantly worsened from the prior CT, reflecting worsening bilateral pneumonia. 3. The patient was recently diagnosed with classic Hodgkin's lymphoma for right axillary lymph node biopsy results. The  Hodgkin's lymphoma is likely unrelated to the patient's diffuse bilateral pneumonia, as lymph nodes typically do not obstruct the tracheobronchial tree. Enlarged mediastinal and right axillary nodes reflect the patient's lymphoma. 4. 3.8 cm collection of fluid at the right axilla likely reflects a postoperative seroma, status post recent right hilar lymph node biopsy. These results were called by telephone at the time of interpretation on 04/10/2016 at 9:19 pm to [facility] PA, who verbally acknowledged these results. Electronically Signed   By: Garald Balding M.D.   On: 04/10/2016 21:26   Dg Chest Port 1 View  04/19/2016  CLINICAL DATA:  Unsuccessful thoracentesis EXAM: PORTABLE CHEST 1 VIEW COMPARISON:  04/17/2016 FINDINGS: Endotracheal and NG tubes have been removed. Left jugular central venous catheter is stable with its tip at the upper SVC. Extensive bilateral consolidation of right groin left has improved. There is no evidence of pneumothorax. IMPRESSION: No evidence of pneumothorax Bilateral airspace disease right greater than left has improved Extubated. Electronically Signed   By: Marybelle Killings M.D.   On: 04/19/2016 14:36   Dg Chest Port 1 View  04/17/2016  CLINICAL DATA:  Respiratory failure. Hx asthma, Hodgkin lymphoma, htn  EXAM: PORTABLE CHEST 1 VIEW COMPARISON:  04/16/2016 FINDINGS: Endotracheal tube is in place, tip 5.1 cm above the carina. Nasogastric tube is in place with tip off the film beyond the gastroesophageal junction. Left IJ central line tip overlies the level of the superior vena cava. There are dense opacities in the lungs bilaterally, right greater than left. Bilateral pleural effusions are present. IMPRESSION: 1. No significant change in the appearance of lines and tubes. 2. Stable appearance of significant bilateral infiltrates, right greater than left. Electronically Signed   By: Nolon Nations M.D.   On: 04/17/2016 07:38   Dg Chest Port 1 View  04/16/2016  CLINICAL  DATA:  Respiratory failure healthcare associated pneumonia intubated patient EXAM: PORTABLE CHEST 1 VIEW COMPARISON:  Portable chest x-ray of April 15, 2016 FINDINGS: The right hemi thorax remains largely obscured with only a small amount of aerated upper lobe visible. The left lung is better aerated but increased density has developed in the retrocardiac region and there is further obscuration of the left hemidiaphragm. A small portion of the left heart border is visible. The pulmonary vascularity is indistinct. The endotracheal tube tip lies 6.4 cm above the carina. The esophagogastric tube tip projects below the inferior margin of the image. The left internal jugular venous catheter tip projects over the proximal SVC. IMPRESSION: Near total opacification of the right hemi thorax consistent with pneumonia and pleural effusion. Increasing opacity on the left consistent with progressive pneumonia. No significant left-sided pleural effusion is visible but is likely obscured. Electronically Signed   By: David  Martinique M.D.   On: 04/16/2016 07:05   Dg Chest Port 1 View  04/15/2016  CLINICAL DATA:  Respiratory failure, evaluate endotracheal tube position EXAM: PORTABLE CHEST 1 VIEW COMPARISON:  04/14/2016 FINDINGS: Endotracheal tube unchanged with tip 5.5 cm above the carina. Orogastric and left IJ support devices stable. Extensive opacification over the right lung stable. Patchy opacification over the left lung stable. IMPRESSION: No change from prior study. Electronically Signed   By: Skipper Cliche M.D.   On: 04/15/2016 07:00   Dg Chest Port 1 View  04/14/2016  CLINICAL DATA:  30 year old female with history of right lower lobe biopsy. EXAM: PORTABLE CHEST 1 VIEW COMPARISON:  Chest x-ray 04/14/2016. FINDINGS: An endotracheal tube is in place with tip 5.8 cm above the carina. There is a left-sided internal jugular central venous catheter with tip terminating in the proximal superior vena cava. A nasogastric tube  is seen extending into the stomach, however, the tip of the nasogastric tube extends below the lower margin of the image. Again noted is extensive airspace consolidation throughout the lungs bilaterally (right greater than left), with some improving aeration in the right upper lobe compared to the prior examination. Small left pleural effusion. Possible right pleural effusion, difficult to judge given the extent of right-sided airspace consolidation on this single view examination. No evidence of pulmonary edema. Heart size is upper limits of normal. Upper mediastinal contours are within normal limits. IMPRESSION: 1. Slight improvement in aeration in the right upper lobe. Otherwise, the appearance the chest is similar, again most compatible with multilobar pneumonia. 2. Support apparatus, as above. Electronically Signed   By: Vinnie Langton M.D.   On: 04/14/2016 13:48   Dg Chest Port 1 View  04/14/2016  CLINICAL DATA:  Endotracheal tube position, Hodgkin lymphoma. EXAM: PORTABLE CHEST 1 VIEW COMPARISON:  04/13/2016 and CT chest 04/10/2016. FINDINGS: Endotracheal tube terminates approximately 6.5 cm above carina, stable. Nasogastric tube is  followed into the stomach. Left IJ central line tip projects at the junction of the brachiocephalic veins. Near complete opacification of the right hemithorax with leftward shift of the heart and mediastinum, as before. Patchy airspace consolidation bilaterally. Probable small left pleural effusion. IMPRESSION: 1. Dense consolidation throughout the right lung with patchy areas of consolidation in the left lung, likely due to pneumonia. 2. Possible small left pleural effusion. Electronically Signed   By: Lorin Picket M.D.   On: 04/14/2016 07:07   Dg Chest Port 1 View  04/13/2016  CLINICAL DATA:  Status post central catheter placement. Hodgkin's lymphoma. EXAM: PORTABLE CHEST 1 VIEW COMPARISON:  Study obtained earlier in the day as well as chest CT April 10, 2016  FINDINGS: Central catheter tip is in the superior vena cava near the junction with the left innominate vein. Endotracheal tube tip is 4.4 cm above the carina. Nasogastric tube tip and side port are below the diaphragm. No pneumothorax. There is extensive consolidation with probable superimposed effusion, causing opacification of most of the right hemithorax, stable. There are areas of consolidation in the left mid lung with slightly less overall opacity compared to earlier in the day. The heart size and pulmonary vascularity are normal. It is difficult to assess for adenopathy given the overlying areas of consolidation. No bone lesions are evident. IMPRESSION: Tube and catheter positions as described without pneumothorax. Widespread pneumonia stable on the right with slightly less overall opacity on the left compared to earlier in the day. Confluent opacity is present in the left mid lung focally. Stable cardiac silhouette. Electronically Signed   By: Lowella Grip III M.D.   On: 04/13/2016 11:20   Dg Chest Port 1 View  04/13/2016  CLINICAL DATA:  Endotracheal orogastric tube placement EXAM: PORTABLE CHEST 1 VIEW COMPARISON:  Two days ago FINDINGS: Endotracheal tube tip just below the clavicular heads. An orogastric tube reaches the stomach. Bilateral pneumonia as demonstrated on recent chest CT, nearly confluent throughout the right lung. Air bronchograms are intermittently seen. No evidence of air leak or cavitation. No visible significant pleural effusion. Normal heart size. IMPRESSION: 1. Unremarkable positioning new endotracheal or orogastric tubes. 2.  Bilateral pneumonia without progression since 2 days ago. Electronically Signed   By: Monte Fantasia M.D.   On: 04/13/2016 08:23   Dg C-arm Bronchoscopy  04/14/2016  CLINICAL DATA:  C-ARM BRONCHOSCOPY Fluoroscopy was utilized by the requesting physician.  No radiographic interpretation.    ASSESSMENT & PLAN:   30 year old African-American female  with  #1 Mixed cellularity Hodgkin's lymphoma IVB with extensive lymphadenopathy including right axillary, mediastinal and upper retroperitoneal and now biopsy proven pulmonary involvement. She was noted to have significant constitutional symptoms including significant weight loss, fevers chills and some night sweats. HIV negative Hepatitis C and hepatitis B serologies negative. Echo with normal ejection fraction.  #2 hypoxic respiratory failure with dense right lung consolidation and left upper lobe consolidation. Pulmonary biopsy +ve for CHL  Cannot rule out superadded pneumonia. Appears to have some chronic hypercapnia. BAL cx no growth thus far. procalcitonin not very impressive to suggest overt sepsis. Patient notes breathing much improved.  On nasal cannula at 3 L/m.  #3 ABdominal distension - improved. Has retroperitoneal and mesenteric adenopathy from her Hodgkin's lymphoma.   #4 significant fluid retention- diuresing well as per ICU team.  #5 leukocytosis likely related to her Neupogen. The patient's WBC count has not nadired yet. We expect this to happen Friday and over the weekend. Would continue  complete the 5 doses of Neupogen as planned. CT scan showed normal spleen size which is reassuring perspective of risk of splenic injury. Plan -improving breathing and cough. -Has completed her course of antibiotics for possible healthcare acquired pneumonia /postobstructive pneumonia . -Ongoing nutritional optimization . -Continue daily Tbo-filgrastim to reduce the degree and duration of neutropenia from chemotherapy. Expected to nadir her counts over the weekend. -No prohibitive toxicity from chemotherapy at this time. No nausea.  -Daily CBC and BMP -Being diuresed for volume overload as per ICU team . -It appears that the patient might transfer out of the ICU today .  Appreciate the excellent cares by the critical care team . We'll continue to follow along .  Sullivan Lone MD [place]  AAHIVMS [facility] Hematology/Oncology Physician [facility]  (Office):       [phone] (Work cell):  [phone] (Fax):           [phone]

## 2016-04-22 LAB — BASIC METABOLIC PANEL
Anion gap: 7 (ref 5–15)
BUN: 6 mg/dL (ref 6–20)
CALCIUM: 8.7 mg/dL — AB (ref 8.9–10.3)
CHLORIDE: 95 mmol/L — AB (ref 101–111)
CO2: 38 mmol/L — AB (ref 22–32)
CREATININE: 0.34 mg/dL — AB (ref 0.44–1.00)
GFR calc Af Amer: >60 mL/min (ref >60)
GFR calc non Af Amer: >60 mL/min (ref >60)
GLUCOSE: 95 mg/dL (ref 65–99)
Potassium: 4.2 mmol/L (ref 3.5–5.1)
Sodium: 140 mmol/L (ref 135–145)

## 2016-04-22 LAB — CBC
HCT: 30.2 % — ABNORMAL LOW (ref 36.0–46.0)
Hemoglobin: 9.7 g/dL — ABNORMAL LOW (ref 12.0–15.0)
MCH: 29.3 pg (ref 26.0–34.0)
MCHC: 32.1 g/dL (ref 30.0–36.0)
MCV: 91.2 fL (ref 78.0–100.0)
PLATELETS: 559 10*3/uL — AB (ref 150–400)
RBC: 3.31 MIL/uL — ABNORMAL LOW (ref 3.87–5.11)
RDW: 14 % (ref 11.5–15.5)
WBC: 41.3 10*3/uL — ABNORMAL HIGH (ref 4.0–10.5)

## 2016-04-22 LAB — GLUCOSE, CAPILLARY
GLUCOSE-CAPILLARY: 178 mg/dL — AB (ref 65–99)
Glucose-Capillary: 85 mg/dL (ref 65–99)
Glucose-Capillary: 145 mg/dL — ABNORMAL HIGH (ref 65–99)

## 2016-04-22 MED ORDER — IPRATROPIUM BROMIDE 0.02 % IN SOLN
0.5000 mg | Freq: Four times a day (QID) | RESPIRATORY_TRACT | Status: DC
  Administered 2016-04-22 – 2016-04-25 (×12): 0.5 mg via RESPIRATORY_TRACT
  Filled 2016-04-22 (×14): qty 2.5

## 2016-04-22 MED ORDER — METOPROLOL TARTRATE 25 MG PO TABS
25.0000 mg | ORAL_TABLET | Freq: Two times a day (BID) | ORAL | Status: DC
  Administered 2016-04-22 – 2016-04-23 (×4): 25 mg via ORAL
  Filled 2016-04-22 (×4): qty 1

## 2016-04-22 MED ORDER — LEVALBUTEROL HCL 1.25 MG/0.5ML IN NEBU
1.2500 mg | INHALATION_SOLUTION | Freq: Four times a day (QID) | RESPIRATORY_TRACT | Status: DC
  Administered 2016-04-22 – 2016-04-25 (×11): 1.25 mg via RESPIRATORY_TRACT
  Filled 2016-04-22 (×18): qty 0.5

## 2016-04-22 NOTE — Progress Notes (Signed)
Date:  April 22, 2016 Chart reviewed for concurrent status and case management needs. Will continue to follow patient for changes and needs: 6lo2/Schubert, steroids and bronchodilators Velva Harman, BSN, [place], [place]

## 2016-04-22 NOTE — Progress Notes (Signed)
Transferred to 1410 via wheelchair with o2 at [place]

## 2016-04-22 NOTE — Progress Notes (Signed)
PROGRESS NOTE   Heidi [name]  [id number] DOB: 25-Mar-1986 DOA: 04/10/2016 PCP: No primary care provider on file.  Outpatient Specialists: Oncology: Dr. Irene Limbo   Subjective: Heart rate elevated since yesterday, sinus tachycardia. Still on 4-5 L of oxygen. Very disappointing that she was not able to move out of the ICU yesterday, will try to monitor overnight in telemetry bed.  Brief Narrative:  30 yo female was admitted in March 2017 with respiratory failure from pneumonia and ARDS. She was found to have diffuse adenopathy >> Rt axillary bx showed Hodgkin's lymphoma. She was set up for outpt follow up with oncology. On 4/15 she presented with worsening dyspnea. CXR and CT chest showed progressive Rt >> Lt ASD. She was started on Abx for HCAP and supplemental oxygen. On 4/18 she developed worsening mental status. This was associated with progressive hypoxia and hypercapnia with respiratory acidosis. She was tried on BiPAP w/o improvement and required intubation. Patient was subsequently transferred to [facility] service. Patient finished a ten-day course of antibiotic treatment and underwent bronchoscopy with biopsies and BAL. Biopsies consistent with mixed cellularity Hodgkin's lymphoma. Oncology was consulted and followed the patient throughout the hospitalization. Patient was started on chemotherapy per oncology. Patient subsequently extubated 04/18/2016 and currently on 6 L nasal cannula as well as bronchodilators. Patient transferred to medicine service 04/20/2016.  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  Patient status post 10 days antibiotics for probable HCAP. HIV negative. Hep C and hep B serologies negative.  Has had her chemotherapy, Neupogen treatment continued.  Acute respiratory failure with hypoxia secondary to Hodgkin's lymphoma and questionable HCAP Status post vent-dependent respiratory failure. Patient was extubated and currently on 6 L nasal cannula.  Patient status post 10 days antibiotic treatment for HCAP.  BAL cultures negative to date. Continue Pulmicort, DuoNeb nebs, IV Lasix. Placed on Hycodan as needed. Follow. We'll try to wean oxygen to keep sats above 90%.  HCAP Status post bronchoscopy 04/14/2016 with BA cultures with no growth to date.  Blood cultures negative 2. Urine strep pneumococcus antigen is negative.  Patient now on 6 L nasal cannula. Status post 10 days of antibiotics treatment, still needs about 4-5 L of oxygen.  Tachycardia Sinus tachycardia, likely secondary to her hypoxia and labored breathing. Obtain 12-lead EKG to rule out tachyarrhythmias, started on low-dose of metoprolol.  Hypokalemia Likely secondary to diuretics. Replete.  Volume overload CT abdomen and pelvis with persistent effusions, infiltrates and pulmonary nodules, diffuse body wall edema suggesting anasarca and moderate free pelvic fluid. She is on IV Lasix, fluid overload appears to be resolved. We'll discontinue Lasix.  Leukocytosis Likely secondary to problem #1 and Neupogen, per oncology continue the planned 5 doses.  Abdominal distention CT abdomen and pelvis with mesenteric and retroperitoneal lymphadenopathy as well as suggesting anasarca. Patient on IV Lasix. Patient receiving  chemotherapy. Follow.  Protein calorie  malnutrition Patient started on a diet. PPI.  Acute metabolic encephalopathy Likely secondary to problem #1 and 2. Improved.  Possible right pleural effusion Status post thoracentesis 04/19/2016 which was aborted as 0 pleural fluid was obtained. Follow.   DVT prophylaxis: Lovenox Code Status: Full Family Communication: Updated patient. No family at bedside. Disposition Plan: Remain the step down unit. Transfer to MedSurg once O2 requirements have improved.   Consultants:   PCCM: Dr Halford Chessman 04/13/2016  Oncology:Dr Kale  Procedures:   Thoracentesis 04/19/2016 procedure aborted as 0 pleural fluid was obtained 4/15 CT angio chest >> near complete consolidation Rt lung and central Lt lung, mediastinal LAN 4/19 Bronchoscopy  4/21 CT abd/pelvis >> anasarca. No significant change 4/18 ETT >> 4/23 4/18 Lt IJ CVL >>    Antimicrobials:   IV cefepime 04/10/2016>>>> 04/12/2016  IV vancomycin 04/10/2016>>>> 04/19/2016   Objective: Filed Vitals:   04/22/16 0442 04/22/16 0734 04/22/16 0857 04/22/16 0911  BP: 137/73 156/97    Pulse: 131 123  115  Temp:   98.5 F (36.9 C)   TempSrc:   Oral   Resp: 29 31  24   Height:      Weight:      SpO2: 90% 95%  95%    Intake/Output Summary (Last 24 hours) at 04/22/16 0929 Last data filed at 04/22/16 0800  Gross per 24 hour  Intake   2190 ml  Output   3850 ml  Net  -1660 ml   Filed Weights   04/17/16 0300 04/18/16 0200 04/19/16 0500  Weight: 78.7 kg (173 lb 8 oz) 77.9 kg (171 lb 11.8 oz) 72.6 kg (160 lb 0.9 oz)    Examination:  General exam: Appears calm and comfortable, on 6 L nasal cannula.  Respiratory system: Some coarse breath sounds. Minimal expiratory wheezing. Decreased breath sounds in the bases.  Cardiovascular system: S1 & S2 heard, RRR. No JVD, murmurs, rubs, gallops or clicks. Trace -1 + BLE edema.   Gastrointestinal system: Abdomen is mildly distended, soft and nontender. No  organomegaly or masses felt. Normal bowel sounds heard. Central nervous system: Alert and oriented. No focal neurological deficits. Extremities: Symmetric 5 x 5 power. Skin: No rashes, lesions or ulcers Psychiatry: Judgement and insight appear normal. Mood & affect appropriate.     Data Reviewed: I have personally reviewed following labs and imaging studies  CBC:  Recent Labs Lab 04/17/16 0350 04/18/16 0455 04/20/16 0430 04/21/16 0455 04/22/16 0430  WBC 15.3* 9.3 48.3* 50.0* 41.3*  NEUTROABS  --   --   --  50.0*  --   HGB 8.6* 8.7* 9.1* 9.3* 9.7*  HCT 27.2* 27.9* 28.2* 28.5* 30.2*  MCV 94.8 95.9 90.4 93.1 91.2  PLT 563* 494* 534* 556* 0000000*   Basic Metabolic Panel:  Recent Labs Lab 04/16/16 0350  04/18/16 0455 04/19/16 1100 04/20/16 0430 04/21/16 0455 04/22/16 0430  NA 143  < > 143 139 142 140 140  K 5.0  < > 3.6 3.5 3.4* 3.6 4.2  CL 99*  < > 99* 94* 93* 93* 95*  CO2 35*  < > 39* 39* 41* 41* 38*  GLUCOSE 144*  < > 117* 128* 92 77 95  BUN 14  < > 21* 15 11 7 6   CREATININE 0.46  < > 0.36* <0.30* 0.32* <0.30* 0.34*  CALCIUM 8.4*  < > 8.5* 8.3* 8.3* 8.1* 8.7*  MG 1.9  --   --   --   --  1.7  --   PHOS  3.4  --   --   --   --   --   --   < > = values in this interval not displayed. GFR: Estimated Creatinine Clearance: 104.9 mL/min (by C-G formula based on Cr of 0.34). Liver Function Tests:  Recent Labs Lab 04/19/16 1100 04/21/16 0455  AST 22 15  ALT 22 17  ALKPHOS 64 83  BILITOT 0.5 0.3  PROT 4.7* 4.7*  ALBUMIN 1.8* 2.0*   No results for input(s): LIPASE, AMYLASE in the last 168 hours. No results for input(s): AMMONIA in the last 168 hours. Coagulation Profile: No results for input(s): INR, PROTIME in the last 168 hours. Cardiac Enzymes: No results for input(s): CKTOTAL, CKMB, CKMBINDEX, TROPONINI in the last 168 hours. BNP (last 3 results) No results for input(s): PROBNP in the last 8760 hours. HbA1C: No results for input(s): HGBA1C in the last 72  hours. CBG:  Recent Labs Lab 04/21/16 1229 04/21/16 1542 04/21/16 1955 04/21/16 2332 04/22/16 0827  GLUCAP 78 124* 144* 178* 145*   Lipid Profile: No results for input(s): CHOL, HDL, LDLCALC, TRIG, CHOLHDL, LDLDIRECT in the last 72 hours. Thyroid Function Tests: No results for input(s): TSH, T4TOTAL, FREET4, T3FREE, THYROIDAB in the last 72 hours. Anemia Panel: No results for input(s): VITAMINB12, FOLATE, FERRITIN, TIBC, IRON, RETICCTPCT in the last 72 hours. Urine analysis:    Component Value Date/Time   COLORURINE AMBER BIOCHEMICALS MAY BE AFFECTED BY COLOR* 08/01/2009 1305   APPEARANCEUR CLEAR 08/01/2009 1305   LABSPEC 1.029 08/01/2009 1305   PHURINE 6.5 08/01/2009 1305   GLUCOSEU NEGATIVE 08/01/2009 1305   HGBUR NEGATIVE 08/01/2009 1305   BILIRUBINUR SMALL* 08/01/2009 1305   KETONESUR 15* 08/01/2009 1305   PROTEINUR NEGATIVE 08/01/2009 1305   UROBILINOGEN 1.0 08/01/2009 1305   NITRITE NEGATIVE 08/01/2009 1305   LEUKOCYTESUR SMALL* 08/01/2009 1305   Sepsis Labs: @LABRCNTIP (procalcitonin:4,lacticidven:4)  ) Recent Results (from the past 240 hour(s))  Culture, respiratory (NON-Expectorated)     Status: None   Collection Time: 04/13/16  8:24 AM  Result Value Ref Range Status   Specimen Description ENDOTRACHEAL  Final   Special Requests NONE  Final   Gram Stain   Final    FEW WBC PRESENT,BOTH PMN AND MONONUCLEAR NO SQUAMOUS EPITHELIAL CELLS SEEN NO ORGANISMS SEEN Performed at [facility]    Culture   Final    NO GROWTH 2 DAYS Performed at [facility]    Report Status 04/16/2016 FINAL  Final  Culture, bal-quantitative     Status: None   Collection Time: 04/14/16  1:03 PM  Result Value Ref Range Status   Specimen Description BRONCHIAL ALVEOLAR LAVAGE  Final   Special Requests NONE  Final   Gram Stain   Final    FEW WBC PRESENT,BOTH PMN AND MONONUCLEAR NO SQUAMOUS EPITHELIAL CELLS SEEN NO ORGANISMS SEEN Performed at [facility]      Culture   Final    NO GROWTH 2 DAYS Performed at [facility]    Report Status 04/17/2016 FINAL  Final  Acid Fast Smear (AFB)     Status: None   Collection Time: 04/14/16  1:03 PM  Result Value Ref Range Status   AFB Specimen Processing Concentration  Final   Acid Fast Smear Negative  Final    Comment: (NOTE) Performed At: [facility] [place], [place] [id number] Lindon Romp MD [id number]    Source (AFB) BRONCHIAL ALVEOLAR LAVAGE  Final  Fungus Culture With Stain  Status: None (Preliminary result)   Collection Time: 04/14/16  1:03 PM  Result Value Ref Range Status   Fungus Stain Final report  Final    Comment: (NOTE) Performed At: [facility] [place], [place] [id number] Lindon Romp MD [id number]    Fungus (Mycology) Culture PENDING  Incomplete   Fungal Source BRONCHIAL ALVEOLAR LAVAGE  Final  Fungus Culture Result     Status: None   Collection Time: 04/14/16  1:03 PM  Result Value Ref Range Status   Result 1 Comment  Final    Comment: (NOTE) KOH/Calcofluor preparation:  no fungus observed. Performed At: [facility] [place], [place] [id number] Lindon Romp MD [id number]          Radiology Studies: Dg Chest 2 View  04/21/2016  CLINICAL DATA:  Shortness of breath, history of lymphoma EXAM: CHEST  2 VIEW COMPARISON:  04/20/2016 FINDINGS: Cardiomediastinal silhouette is stable. Again noted extensive multifocal nodular consolidation bilaterally right greater than left without change in aeration. Left IJ central line with tip in upper SVC is unchanged in position. No convincing pulmonary edema. IMPRESSION: Again noted extensive multifocal nodular consolidation bilaterally right greater than left without change in aeration. Left IJ central line with tip in upper SVC is unchanged in position. No convincing pulmonary edema. Electronically Signed   By: Lahoma Crocker M.D.   On:  04/21/2016 11:04   Dg Chest 2 View  04/20/2016  CLINICAL DATA:  Shortness of breath, cough, dizziness.  Lymphoma. EXAM: CHEST  2 VIEW COMPARISON:  04/19/2016 FINDINGS: Dense consolidation throughout much of the right lung. Patchy nodular opacities in the mid and lower lung. Mild cardiomegaly. No visible effusions. No acute bony abnormality. Left central line tip in the SVC, unchanged. IMPRESSION: Extensive bilateral nodular and masslike airspace disease, right much greater than left. No real change since prior study. This presumably represents multifocal pneumonia Electronically Signed   By: Rolm Baptise M.D.   On: 04/20/2016 09:49        Scheduled Meds: . antiseptic oral rinse  7 mL Mouth Rinse BID  . budesonide (PULMICORT) nebulizer solution  0.5 mg Nebulization BID  . enoxaparin (LOVENOX) injection  40 mg Subcutaneous QHS  . furosemide  40 mg Intravenous Q12H  . insulin aspart  0-15 Units Subcutaneous 6 times per day  . ipratropium-albuterol  3 mL Nebulization QID  . pantoprazole  40 mg Oral Daily  . Tbo-Filgrastim  480 mcg Subcutaneous q1800   Continuous Infusions:     LOS: 12 days    Time spent: 45 mins    Syenna Nazir A, MD [facility] Pager [phone]  If 7PM-7AM, please contact night-coverage [email or web address] Password [facility] 04/22/2016, 9:29 AM

## 2016-04-22 NOTE — Progress Notes (Signed)
Dr Hartford Poli states to transfer patient to telemetry with central .

## 2016-04-22 NOTE — Progress Notes (Signed)
Nutrition Follow-up  DOCUMENTATION CODES:   Not applicable  INTERVENTION:  -RD continue to monitor  NUTRITION DIAGNOSIS:   Increased nutrient needs related to cancer and cancer related treatments as evidenced by estimated needs.  -ongoing  GOAL:   Patient will meet greater than or equal to 90% of their needs  -meeting  MONITOR:   PO intake, Supplement acceptance, Labs, Weight trends, I & O's  REASON FOR ASSESSMENT:   Consult, Ventilator Enteral/tube feeding initiation and management  ASSESSMENT:   30 y.o. female with a recent diagnosis of Lymphoma and hospitalization for Pneumonia and ARDS on 03/21-03/28 who presents to the ED with complaints of worsening SOB along with cough and Fevers and chills for the past 2 weeks.  Pt is now on 6L [place]  s/p 10 days ABX with poss HCAP; started chemo 4/23.  Per chart, PO intake - 75-100% Per patient, appetite has been ok with Chemo, endorses taste changes but denies metallic or bland tasting.  Labs: CBGs 144-175; Medications reviewed  Diet Order:  Diet regular Room service appropriate?: Yes; Fluid consistency:: Thin  Skin:  Wound (see comment) (closed arm incision)  Last BM:  4/23  Height:   Ht Readings from Last 1 Encounters:  04/18/16 5\' 6"  (1.676 m)    Weight:   Wt Readings from Last 1 Encounters:  04/19/16 160 lb 0.9 oz (72.6 kg)    Ideal Body Weight:  59.09 kg  BMI:  Body mass index is 25.85 kg/(m^2).  Estimated Nutritional Needs:   Kcal:  1850-2050  Protein:  90-100g  Fluid:  1.8L/day  EDUCATION NEEDS:   No education needs identified at this time  Satira Anis. Andrei Mccook, MS, RD LDN After Hours/Weekend Pager [phone]

## 2016-04-23 ENCOUNTER — Telehealth: Payer: Self-pay | Admitting: *Deleted

## 2016-04-23 ENCOUNTER — Telehealth: Payer: Self-pay | Admitting: Hematology

## 2016-04-23 ENCOUNTER — Other Ambulatory Visit: Payer: Self-pay | Admitting: Hematology

## 2016-04-23 LAB — BASIC METABOLIC PANEL
Anion gap: 8 (ref 5–15)
BUN: 6 mg/dL (ref 6–20)
CHLORIDE: 98 mmol/L — AB (ref 101–111)
CO2: 34 mmol/L — ABNORMAL HIGH (ref 22–32)
CREATININE: 0.39 mg/dL — AB (ref 0.44–1.00)
Calcium: 8.5 mg/dL — ABNORMAL LOW (ref 8.9–10.3)
GFR calc Af Amer: >60 mL/min (ref >60)
GFR calc non Af Amer: >60 mL/min (ref >60)
GLUCOSE: 129 mg/dL — AB (ref 65–99)
POTASSIUM: 4.2 mmol/L (ref 3.5–5.1)
SODIUM: 140 mmol/L (ref 135–145)

## 2016-04-23 LAB — CBC
HEMATOCRIT: 30 % — AB (ref 36.0–46.0)
HEMOGLOBIN: 9.5 g/dL — AB (ref 12.0–15.0)
MCH: 29.7 pg (ref 26.0–34.0)
MCHC: 31.7 g/dL (ref 30.0–36.0)
MCV: 93.8 fL (ref 78.0–100.0)
Platelets: 562 10*3/uL — ABNORMAL HIGH (ref 150–400)
RBC: 3.2 MIL/uL — ABNORMAL LOW (ref 3.87–5.11)
RDW: 14.1 % (ref 11.5–15.5)
WBC: 31.2 10*3/uL — AB (ref 4.0–10.5)

## 2016-04-23 NOTE — Telephone Encounter (Signed)
Per staff message and POF I have scheduled appts. Advised scheduler of appts. JMW  

## 2016-04-23 NOTE — Progress Notes (Signed)
PT Cancellation Note  Patient Details Name: Heidi [name] MRN: [id number] DOB: Dec 09, 1986   Cancelled Treatment:    Reason Eval/Treat Not Completed: Pain limiting ability to participate. Will check back at another time.   Claretha Cooper 04/23/2016, 4:37 PM Tresa Endo PT [phone]

## 2016-04-23 NOTE — Progress Notes (Signed)
PROGRESS NOTE   Heidi [name]  [id number] DOB: 11/10/1986 DOA: 04/10/2016 PCP: No primary care provider on file.  Outpatient Specialists: Oncology: Dr. Irene Limbo   Subjective: Oxygen saturations are stable on 4 L of oxygen via nasal cannula. Developed fever of 100.1 last night, unsure if it's secondary to infection or part of her lymphoma B symptoms. Continues to say that she wants to go home  Brief Narrative:  30 yo female was admitted in March 2017 with respiratory failure from pneumonia and ARDS. She was found to have diffuse adenopathy >> Rt axillary bx showed Hodgkin's lymphoma. She was set up for outpt follow up with oncology. On 4/15 she presented with worsening dyspnea. CXR and CT chest showed progressive Rt >> Lt ASD. She was started on Abx for HCAP and supplemental oxygen. On 4/18 she developed worsening mental status. This was associated with progressive hypoxia and hypercapnia with respiratory acidosis. She was tried on BiPAP w/o improvement and required intubation. Patient was subsequently transferred to [facility] service. Patient finished a ten-day course of antibiotic treatment and underwent bronchoscopy with biopsies and BAL. Biopsies consistent with mixed cellularity Hodgkin's lymphoma. Oncology was consulted and followed the patient throughout the hospitalization. Patient was started on chemotherapy per oncology. Patient subsequently extubated 04/18/2016 and currently on 6 L nasal cannula as well as bronchodilators. Patient transferred to medicine service 04/20/2016.  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  Patient status post 10 days antibiotics for probable HCAP. HIV negative. Hep C and hep B serologies negative.  Has had her chemotherapy, Neupogen treatment continued., No changes clinically.  Fever Had low-grade fever of 100.1, unsure if this is secondary to infection or part of her lymphoma B symptoms. Continue to monitor patient as she will have low white blood cells over the weekend. Patient wants to go home, I spoke with Dr. Irene Limbo, he will evaluate the patient.  Acute respiratory failure with hypoxia secondary to Hodgkin's lymphoma and questionable HCAP Status post vent-dependent respiratory failure. Patient was extubated and currently on 6 L nasal cannula.  Patient status post 10 days antibiotic treatment for HCAP.  BAL cultures negative to date. Continue Pulmicort, DuoNeb nebs, IV Lasix. Placed on Hycodan as needed. Follow. Oxygen weaned down to 4 L.  HCAP Status post bronchoscopy 04/14/2016 with BA cultures with no growth to date.  Blood cultures negative 2. Urine strep pneumococcus antigen is negative.  Patient now on 6 L nasal cannula. Status post 10 days of antibiotics treatment, still needs about 4-5 L of oxygen.  Tachycardia Sinus tachycardia, likely secondary to her hypoxia and labored breathing. Obtain 12-lead EKG to rule out tachyarrhythmias, started on low-dose of metoprolol.  Hypokalemia Likely secondary to diuretics. Repleted.  Volume overload CT abdomen and pelvis with persistent effusions, infiltrates and pulmonary nodules, diffuse body wall edema suggesting anasarca and moderate free pelvic fluid. She is on IV  Lasix, fluid overload appears to be resolved. Lasix  discontinued on 4/27  Leukocytosis Likely secondary to problem #1 and Neupogen, per oncology continue the planned 5 doses.  Abdominal distention CT abdomen and pelvis with mesenteric and retroperitoneal lymphadenopathy as well as suggesting anasarca. Patient on IV Lasix. Patient received chemotherapy. This is resolved.   Protein calorie malnutrition Patient started on a diet. PPI.  Acute metabolic encephalopathy Likely secondary to problem #1 and 2. Improved.  Possible right pleural effusion Status post thoracentesis 04/19/2016 which was aborted as 0 pleural fluid was obtained. Follow.   DVT prophylaxis: Lovenox Code Status: Full Family Communication: Updated patient. No family at bedside. Disposition Plan: Remain the step down unit. Transfer to MedSurg once O2 requirements have improved.   Consultants:   PCCM: Dr Halford Chessman 04/13/2016  Oncology:Dr Kale  Procedures:   Thoracentesis 04/19/2016 procedure aborted as 0 pleural fluid was obtained 4/15 CT angio chest >> near complete consolidation Rt lung and central Lt lung, mediastinal LAN 4/19 Bronchoscopy  4/21 CT abd/pelvis >> anasarca. No significant change 4/18 ETT >> 4/23 4/18 Lt IJ CVL >>    Antimicrobials:   IV cefepime 04/10/2016>>>> 04/12/2016  IV vancomycin 04/10/2016>>>> 04/19/2016   Objective: Filed Vitals:   04/22/16 2230 04/23/16 0419 04/23/16 0447 04/23/16 0935  BP:   150/77 149/89  Pulse:  104 105 115  Temp: 98.6 F (37 C)  98.6 F (37 C)   TempSrc:   Oral   Resp: 20  22   Height:      Weight:      SpO2:   97%     Intake/Output Summary (Last 24 hours) at 04/23/16 1104 Last data filed at 04/22/16 2225  Gross per 24 hour  Intake    560 ml  Output    800 ml  Net   -240 ml   Filed Weights   04/18/16 0200 04/19/16 0500 04/22/16 1201  Weight: 77.9 kg (171 lb 11.8 oz) 72.6 kg (160 lb 0.9 oz) 70 kg (154 lb 5.2 oz)    Examination:  General exam: Appears calm and comfortable, on 6 L nasal  cannula.  Respiratory system: Some coarse breath sounds. Minimal expiratory wheezing. Decreased breath sounds in the bases.  Cardiovascular system: S1 & S2 heard, RRR. No JVD, murmurs, rubs, gallops or clicks. Trace -1 + BLE edema.   Gastrointestinal system: Abdomen is mildly distended, soft and nontender. No organomegaly or masses felt. Normal bowel sounds heard. Central nervous system: Alert and oriented. No focal neurological deficits. Extremities: Symmetric 5 x 5 power. Skin: No rashes, lesions or ulcers Psychiatry: Judgement and insight appear normal. Mood & affect appropriate.     Data Reviewed: I have personally reviewed following labs and imaging studies  CBC:  Recent Labs Lab 04/18/16 0455 04/20/16 0430 04/21/16 0455 04/22/16 0430 04/23/16 0434  WBC 9.3 48.3* 50.0* 41.3* 31.2*  NEUTROABS  --   --  50.0*  --   --   HGB 8.7* 9.1* 9.3* 9.7* 9.5*  HCT 27.9* 28.2* 28.5* 30.2* 30.0*  MCV 95.9 90.4 93.1 91.2 93.8  PLT 494* 534* 556* 559* [id number]*   Basic Metabolic Panel:  Recent Labs Lab 04/19/16 1100 04/20/16 0430 04/21/16 0455 04/22/16 0430 04/23/16 0434  NA 139 142 140 140 140  K 3.5 3.4* 3.6 4.2 4.2  CL 94* 93* 93* 95* 98*  CO2 39* 41* 41* 38* 34*  GLUCOSE 128* 92 77 95 129*  BUN 15 11 7 6 6   CREATININE <0.30* 0.32* <0.30*  0.34* 0.39*  CALCIUM 8.3* 8.3* 8.1* 8.7* 8.5*  MG  --   --  1.7  --   --    GFR: Estimated Creatinine Clearance: 90.9 mL/min (by C-G formula based on Cr of 0.39). Liver Function Tests:  Recent Labs Lab 04/19/16 1100 04/21/16 0455  AST 22 15  ALT 22 17  ALKPHOS 64 83  BILITOT 0.5 0.3  PROT 4.7* 4.7*  ALBUMIN 1.8* 2.0*   No results for input(s): LIPASE, AMYLASE in the last 168 hours. No results for input(s): AMMONIA in the last 168 hours. Coagulation Profile: No results for input(s): INR, PROTIME in the last 168 hours. Cardiac Enzymes: No results for input(s): CKTOTAL, CKMB, CKMBINDEX, TROPONINI in the last 168 hours. BNP (last 3  results) No results for input(s): PROBNP in the last 8760 hours. HbA1C: No results for input(s): HGBA1C in the last 72 hours. CBG:  Recent Labs Lab 04/21/16 1542 04/21/16 1955 04/21/16 2332 04/22/16 0446 04/22/16 0827  GLUCAP 124* 144* 178* 85 145*   Lipid Profile: No results for input(s): CHOL, HDL, LDLCALC, TRIG, CHOLHDL, LDLDIRECT in the last 72 hours. Thyroid Function Tests: No results for input(s): TSH, T4TOTAL, FREET4, T3FREE, THYROIDAB in the last 72 hours. Anemia Panel: No results for input(s): VITAMINB12, FOLATE, FERRITIN, TIBC, IRON, RETICCTPCT in the last 72 hours. Urine analysis:    Component Value Date/Time   COLORURINE AMBER BIOCHEMICALS MAY BE AFFECTED BY COLOR* 08/01/2009 1305   APPEARANCEUR CLEAR 08/01/2009 1305   LABSPEC 1.029 08/01/2009 1305   PHURINE 6.5 08/01/2009 1305   GLUCOSEU NEGATIVE 08/01/2009 1305   HGBUR NEGATIVE 08/01/2009 1305   BILIRUBINUR SMALL* 08/01/2009 1305   KETONESUR 15* 08/01/2009 1305   PROTEINUR NEGATIVE 08/01/2009 1305   UROBILINOGEN 1.0 08/01/2009 1305   NITRITE NEGATIVE 08/01/2009 1305   LEUKOCYTESUR SMALL* 08/01/2009 1305   Sepsis Labs: @LABRCNTIP (procalcitonin:4,lacticidven:4)  ) Recent Results (from the past 240 hour(s))  Culture, bal-quantitative     Status: None   Collection Time: 04/14/16  1:03 PM  Result Value Ref Range Status   Specimen Description BRONCHIAL ALVEOLAR LAVAGE  Final   Special Requests NONE  Final   Gram Stain   Final    FEW WBC PRESENT,BOTH PMN AND MONONUCLEAR NO SQUAMOUS EPITHELIAL CELLS SEEN NO ORGANISMS SEEN Performed at [facility]    Culture   Final    NO GROWTH 2 DAYS Performed at [facility]    Report Status 04/17/2016 FINAL  Final  Acid Fast Smear (AFB)     Status: None   Collection Time: 04/14/16  1:03 PM  Result Value Ref Range Status   AFB Specimen Processing Concentration  Final   Acid Fast Smear Negative  Final    Comment: (NOTE) Performed At: [facility] [address] [place], [place] [id number] Lindon Romp MD [id number]    Source (AFB) BRONCHIAL ALVEOLAR LAVAGE  Final  Fungus Culture With Stain     Status: None (Preliminary result)   Collection Time: 04/14/16  1:03 PM  Result Value Ref Range Status   Fungus Stain Final report  Final    Comment: (NOTE) Performed At: [facility] [place], [place] [id number] Lindon Romp MD [id number]    Fungus (Mycology) Culture PENDING  Incomplete   Fungal Source BRONCHIAL ALVEOLAR LAVAGE  Final  Fungus Culture Result     Status: None   Collection Time: 04/14/16  1:03 PM  Result Value Ref Range Status   Result 1 Comment  Final    Comment: (NOTE) KOH/Calcofluor preparation:  no fungus observed. Performed At: [facility] [address] [place], [place] [id number] Lindon Romp MD [id number]          Radiology Studies: No results found.      Scheduled Meds: . antiseptic oral rinse  7 mL Mouth Rinse BID  . budesonide (PULMICORT) nebulizer solution  0.5 mg Nebulization BID  . enoxaparin (LOVENOX) injection  40 mg Subcutaneous QHS  . ipratropium  0.5 mg Nebulization QID  . levalbuterol  1.25 mg Nebulization QID  . metoprolol tartrate  25 mg Oral BID  . pantoprazole  40 mg Oral Daily   Continuous Infusions:     LOS: 13 days    Time spent: 45 mins    Erina Hamme A, MD [facility] Pager [phone]  If 7PM-7AM, please contact night-coverage [email or web address] Password [facility] 04/23/2016, 11:04 AM

## 2016-04-23 NOTE — Telephone Encounter (Signed)
per pof tos ch pt appt-sent MW email to sch trmt-will call pt after reply °

## 2016-04-23 NOTE — Care Management Note (Signed)
Case Management Note  Patient Details  Name: Heidi [name] MRN: [id number] Date of Birth: 03/09/1986  Subjective/Objective:Transfer from SDU. Respiratory failure, Hodkins lymphoma. 0n 02 4lnc. Medicaid pending-has completed medicaid application, Patient agrees to [facility] for pcp-will call for appt @ d/c, patient will be able to get meds filled @ [facility] pharmacy.Patient does not work. Plans to d/c home.If home 02 needed can arrange w/qualifying 02 sats documented.  Provided w/pcp listing-[facility] chosen, health insurance information, [employer] med list.                  Action/Plan:d/c plan home.   Expected Discharge Date:                  Expected Discharge Plan:  Home/Self Care  In-House Referral:     Discharge planning Services  CM Consult, [facility]  Post Acute Care Choice:    Choice offered to:     DME Arranged:    DME Agency:     HH Arranged:    HH Agency:     Status of Service:  In process, will continue to follow  Medicare Important Message Given:    Date Medicare IM Given:    Medicare IM give by:    Date Additional Medicare IM Given:    Additional Medicare Important Message give by:     If discussed at [place] of Stay Meetings, dates discussed:    Additional Comments:  Dessa Phi, RN 04/23/2016, 3:53 PM

## 2016-04-23 NOTE — Progress Notes (Signed)
OT Cancellation Note  Patient Details Name: DEAN BUENAFE MRN: [id number] DOB: [birth year]   Cancelled Treatment:    Reason Eval/Treat Not Completed: Pain limiting ability to participate.  Attempted twice today.  Pt was getting lunch when I first arrived and now pain is too great.  She just had IV meds.  Will check another time.  Dimple Bastyr 04/23/2016, 2:00 PM  Lesle Chris, OTR/L [phone] 04/23/2016

## 2016-04-24 LAB — CBC
HEMATOCRIT: 29.6 % — AB (ref 36.0–46.0)
Hemoglobin: 9.3 g/dL — ABNORMAL LOW (ref 12.0–15.0)
MCH: 28.9 pg (ref 26.0–34.0)
MCHC: 31.4 g/dL (ref 30.0–36.0)
MCV: 91.9 fL (ref 78.0–100.0)
Platelets: 514 10*3/uL — ABNORMAL HIGH (ref 150–400)
RBC: 3.22 MIL/uL — AB (ref 3.87–5.11)
RDW: 14.1 % (ref 11.5–15.5)
WBC: 23.2 10*3/uL — AB (ref 4.0–10.5)

## 2016-04-24 LAB — BASIC METABOLIC PANEL
Anion gap: 8 (ref 5–15)
CHLORIDE: 96 mmol/L — AB (ref 101–111)
CO2: 37 mmol/L — AB (ref 22–32)
Calcium: 8.9 mg/dL (ref 8.9–10.3)
Creatinine, Ser: 0.38 mg/dL — ABNORMAL LOW (ref 0.44–1.00)
GFR calc Af Amer: >60 mL/min (ref >60)
GFR calc non Af Amer: >60 mL/min (ref >60)
Glucose, Bld: 113 mg/dL — ABNORMAL HIGH (ref 65–99)
POTASSIUM: 4.3 mmol/L (ref 3.5–5.1)
SODIUM: 141 mmol/L (ref 135–145)

## 2016-04-24 MED ORDER — METOPROLOL TARTRATE 50 MG PO TABS
50.0000 mg | ORAL_TABLET | Freq: Two times a day (BID) | ORAL | Status: DC
  Administered 2016-04-24 – 2016-04-26 (×4): 50 mg via ORAL
  Filled 2016-04-24 (×5): qty 1

## 2016-04-24 NOTE — Progress Notes (Addendum)
PROGRESS NOTE   Heidi [name]  [id number] DOB: 1986-12-04 DOA: 04/10/2016 PCP: No primary care provider on file.  Outpatient Specialists: Oncology: Dr. Irene Limbo   Subjective: Seen with a friend at bedside, sats stable at mid 90s on 4 L of oxygen. No fever overnight. Check CBC in a.m.  Brief Narrative:  30 yo female was admitted in March 2017 with respiratory failure from pneumonia and ARDS. She was found to have diffuse adenopathy >> Rt axillary bx showed Hodgkin's lymphoma. She was set up for outpt follow up with oncology. On 4/15 she presented with worsening dyspnea. CXR and CT chest showed progressive Rt >> Lt ASD. She was started on Abx for HCAP and supplemental oxygen. On 4/18 she developed worsening mental status. This was associated with progressive hypoxia and hypercapnia with respiratory acidosis. She was tried on BiPAP w/o improvement and required intubation. Patient was subsequently transferred to [facility] service. Patient finished a ten-day course of antibiotic treatment and underwent bronchoscopy with biopsies and BAL. Biopsies consistent with mixed cellularity Hodgkin's lymphoma. Oncology was consulted and followed the patient throughout the hospitalization. Patient was started on chemotherapy per oncology. Patient subsequently extubated 04/18/2016 and currently on 6 L nasal cannula as well as bronchodilators. Patient transferred to medicine service 04/20/2016.  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 [facility]) Active Problems:   Acute respiratory failure with hypoxia (HCC)   Leukocytosis   HCAP (healthcare-associated pneumonia)   Acute respiratory failure (HCC)   Lymphoma (HCC)   Hyponatremia   Thrombocytosis (HCC)   Abdominal distension   Hodgkin lymphoma (HCC)   Hypokalemia   Volume overload   S/P bronchoscopy with biopsy   Encounter for central line placement   Mixed cellularity  Hodgkin's lymphoma stage IV B with extensive lymphadenopathy Patient noted to have lymphadenopathy of the right axillary, mediastinal and upper retroperitoneal and now biopsy-proven pulmonary involvement.  Patient status post 10 days antibiotics for probable HCAP. HIV negative. Hep C and hep B serologies negative.  Has had her chemotherapy, Neupogen stopped, WBC dipping down.  Fever Had low-grade fever of 100.1, unsure if this is secondary to infection or part of her lymphoma B symptoms. Continue to monitor patient as she will have low white blood cells over the weekend. Continue to monitor the patient over the weekend  Acute respiratory failure with hypoxia secondary to Hodgkin's lymphoma and questionable HCAP Status post vent-dependent respiratory failure. Patient was extubated and currently on 6 L nasal cannula.  Patient status post 10 days antibiotic treatment for HCAP.  BAL cultures negative to date. Continue Pulmicort, DuoNeb nebs, IV Lasix. Placed on Hycodan as needed. Follow. Oxygen weaned down to 4 L.  HCAP Status post bronchoscopy 04/14/2016 with BA cultures with no growth to date.  Blood cultures negative 2. Urine strep pneumococcus antigen is negative.  Patient was on 6 L nasal cannula. Status post 10 days of antibiotics treatment, still needs about 4-5 L of oxygen.  Tachycardia Sinus tachycardia, likely secondary to her hypoxia and labored breathing. Obtain 12-lead EKG to rule out tachyarrhythmias, started on low-dose of metoprolol.  Hypokalemia Likely secondary to diuretics. Repleted.  Volume overload CT abdomen and pelvis with persistent effusions, infiltrates and pulmonary nodules, diffuse body wall edema suggesting anasarca and moderate free pelvic fluid. She is on IV Lasix, fluid overload appears to be resolved. Lasix discontinued on 4/27.  Leukocytosis Likely secondary to problem #1 and Neupogen, per oncology continue the planned  5 doses.  Abdominal  distention CT abdomen and pelvis with mesenteric and retroperitoneal lymphadenopathy as well as suggesting anasarca. Patient on IV Lasix. Patient received chemotherapy. This is resolved.   Protein calorie malnutrition Patient started on a diet. PPI.  Acute metabolic encephalopathy Likely secondary to problem #1 and 2. Improved.  Possible right pleural effusion Status post thoracentesis 04/19/2016 which was aborted as 0 pleural fluid was obtained. Follow.   DVT prophylaxis: Lovenox Code Status: Full Code Family Communication: Updated patient. No family at bedside. Disposition Plan: On telemetry, home likely after the weekend.   Consultants:   PCCM: Dr Halford Chessman 04/13/2016  Oncology:Dr Kale  Procedures:   Thoracentesis 04/19/2016 procedure aborted as 0 pleural fluid was obtained 4/15 CT angio chest >> near complete consolidation Rt lung and central Lt lung, mediastinal LAN 4/19 Bronchoscopy  4/21 CT abd/pelvis >> anasarca. No significant change 4/18 ETT >> 4/23 4/18 Lt IJ CVL >>    Antimicrobials:   IV cefepime 04/10/2016>>>> 04/12/2016  IV vancomycin 04/10/2016>>>> 04/19/2016   Objective: Filed Vitals:   04/23/16 1600 04/23/16 1951 04/23/16 2045 04/24/16 0556  BP: 130/78  143/78 132/73  Pulse: 91  116 120  Temp: 98.3 F (36.8 C)  98.8 F (37.1 C) 98 F (36.7 C)  TempSrc: Oral  Oral Oral  Resp:   18 18  Height:      Weight:      SpO2: 94% 93% 94% 94%    Intake/Output Summary (Last 24 hours) at 04/24/16 1009 Last data filed at 04/24/16 0700  Gross per 24 hour  Intake    480 ml  Output      0 ml  Net    480 ml   Filed Weights   04/18/16 0200 04/19/16 0500 04/22/16 1201  Weight: 77.9 kg (171 lb 11.8 oz) 72.6 kg (160 lb 0.9 oz) 70 kg (154 lb 5.2 oz)    Examination:  General exam: Appears calm and comfortable, on 6 L nasal cannula.  Respiratory system: Some coarse breath sounds. Minimal expiratory wheezing. Decreased breath sounds in the bases.   Cardiovascular system: S1 & S2 heard, RRR. No JVD, murmurs, rubs, gallops or clicks. Trace -1 + BLE edema.   Gastrointestinal system: Abdomen is mildly distended, soft and nontender. No organomegaly or masses felt. Normal bowel sounds heard. Central nervous system: Alert and oriented. No focal neurological deficits. Extremities: Symmetric 5 x 5 power. Skin: No rashes, lesions or ulcers Psychiatry: Judgement and insight appear normal. Mood & affect appropriate.     Data Reviewed: I have personally reviewed following labs and imaging studies  CBC:  Recent Labs Lab 04/20/16 0430 04/21/16 0455 04/22/16 0430 04/23/16 0434 04/24/16 0430  WBC 48.3* 50.0* 41.3* 31.2* 23.2*  NEUTROABS  --  50.0*  --   --   --   HGB 9.1* 9.3* 9.7* 9.5* 9.3*  HCT 28.2* 28.5* 30.2* 30.0* 29.6*  MCV 90.4 93.1 91.2 93.8 91.9  PLT 534* 556* 559* 562* 0000000*   Basic Metabolic Panel:  Recent Labs Lab 04/20/16 0430 04/21/16 0455 04/22/16 0430 04/23/16 0434 04/24/16 0430  NA 142 140 140 140 141  K 3.4* 3.6 4.2 4.2 4.3  CL 93* 93* 95* 98* 96*  CO2 41* 41* 38* 34* 37*  GLUCOSE 92 77 95 129* 113*  BUN 11 7 6 6  <5*  CREATININE 0.32* <0.30* 0.34* 0.39* 0.38*  CALCIUM 8.3* 8.1* 8.7* 8.5* 8.9  MG  --  1.7  --   --   --  GFR: Estimated Creatinine Clearance: 90.9 mL/min (by C-G formula based on Cr of 0.38). Liver Function Tests:  Recent Labs Lab 04/19/16 1100 04/21/16 0455  AST 22 15  ALT 22 17  ALKPHOS 64 83  BILITOT 0.5 0.3  PROT 4.7* 4.7*  ALBUMIN 1.8* 2.0*   No results for input(s): LIPASE, AMYLASE in the last 168 hours. No results for input(s): AMMONIA in the last 168 hours. Coagulation Profile: No results for input(s): INR, PROTIME in the last 168 hours. Cardiac Enzymes: No results for input(s): CKTOTAL, CKMB, CKMBINDEX, TROPONINI in the last 168 hours. BNP (last 3 results) No results for input(s): PROBNP in the last 8760 hours. HbA1C: No results for input(s): HGBA1C in the last 72  hours. CBG:  Recent Labs Lab 04/21/16 1542 04/21/16 1955 04/21/16 2332 04/22/16 0446 04/22/16 0827  GLUCAP 124* 144* 178* 85 145*   Lipid Profile: No results for input(s): CHOL, HDL, LDLCALC, TRIG, CHOLHDL, LDLDIRECT in the last 72 hours. Thyroid Function Tests: No results for input(s): TSH, T4TOTAL, FREET4, T3FREE, THYROIDAB in the last 72 hours. Anemia Panel: No results for input(s): VITAMINB12, FOLATE, FERRITIN, TIBC, IRON, RETICCTPCT in the last 72 hours. Urine analysis:    Component Value Date/Time   COLORURINE AMBER BIOCHEMICALS MAY BE AFFECTED BY COLOR* 08/01/2009 1305   APPEARANCEUR CLEAR 08/01/2009 1305   LABSPEC 1.029 08/01/2009 1305   PHURINE 6.5 08/01/2009 1305   GLUCOSEU NEGATIVE 08/01/2009 1305   HGBUR NEGATIVE 08/01/2009 1305   BILIRUBINUR SMALL* 08/01/2009 1305   KETONESUR 15* 08/01/2009 1305   PROTEINUR NEGATIVE 08/01/2009 1305   UROBILINOGEN 1.0 08/01/2009 1305   NITRITE NEGATIVE 08/01/2009 1305   LEUKOCYTESUR SMALL* 08/01/2009 1305   Sepsis Labs: @LABRCNTIP (procalcitonin:4,lacticidven:4)  ) Recent Results (from the past 240 hour(s))  Culture, bal-quantitative     Status: None   Collection Time: 04/14/16  1:03 PM  Result Value Ref Range Status   Specimen Description BRONCHIAL ALVEOLAR LAVAGE  Final   Special Requests NONE  Final   Gram Stain   Final    FEW WBC PRESENT,BOTH PMN AND MONONUCLEAR NO SQUAMOUS EPITHELIAL CELLS SEEN NO ORGANISMS SEEN Performed at [facility]    Culture   Final    NO GROWTH 2 DAYS Performed at [facility]    Report Status 04/17/2016 FINAL  Final  Acid Fast Smear (AFB)     Status: None   Collection Time: 04/14/16  1:03 PM  Result Value Ref Range Status   AFB Specimen Processing Concentration  Final   Acid Fast Smear Negative  Final    Comment: (NOTE) Performed At: [facility] [address] [place], [place] [id number] Lindon Romp MD [id number]    Source (AFB) BRONCHIAL  ALVEOLAR LAVAGE  Final  Fungus Culture With Stain     Status: None (Preliminary result)   Collection Time: 04/14/16  1:03 PM  Result Value Ref Range Status   Fungus Stain Final report  Final    Comment: (NOTE) Performed At: [facility] [place], [place] [id number] Lindon Romp MD [id number]    Fungus (Mycology) Culture PENDING  Incomplete   Fungal Source BRONCHIAL ALVEOLAR LAVAGE  Final  Fungus Culture Result     Status: None   Collection Time: 04/14/16  1:03 PM  Result Value Ref Range Status   Result 1 Comment  Final    Comment: (NOTE) KOH/Calcofluor preparation:  no fungus observed. Performed At: [facility] [address] [place], [place] [id number] Lindon Romp  MD [id number]          Radiology Studies: No results found.      Scheduled Meds: . antiseptic oral rinse  7 mL Mouth Rinse BID  . budesonide (PULMICORT) nebulizer solution  0.5 mg Nebulization BID  . enoxaparin (LOVENOX) injection  40 mg Subcutaneous QHS  . ipratropium  0.5 mg Nebulization QID  . levalbuterol  1.25 mg Nebulization QID  . metoprolol tartrate  25 mg Oral BID  . pantoprazole  40 mg Oral Daily   Continuous Infusions:     LOS: 14 days    Time spent: 45 mins    Rolla Kedzierski A, MD [facility] Pager [phone]  If 7PM-7AM, please contact night-coverage [email or web address] Password [email or web address] 04/24/2016, 10:09 AM

## 2016-04-24 NOTE — Progress Notes (Signed)
PT Cancellation Note  Patient Details Name: Heidi [name] MRN: [id number] DOB: 08-17-1986   Cancelled Treatment:    Reason Eval/Treat Not Completed: Pain limiting ability to participate   Claretha Cooper 04/24/2016, 4:01 PM Tresa Endo PT [phone]

## 2016-04-24 NOTE — Progress Notes (Signed)
OT Cancellation Note  Patient Details Name: Heidi [name] MRN: [id number] DOB: [birth year]   Cancelled Treatment:    Reason Eval/Treat Not Completed: Fatigue/lethargy limiting ability to participate.  Pt very groggy; could not stay aroused. Will check back another day.  Rosina Cressler 04/24/2016, 2:15 PM  Lesle Chris, OTR/L [phone] 04/24/2016

## 2016-04-25 LAB — CBC
HEMATOCRIT: 29.6 % — AB (ref 36.0–46.0)
Hemoglobin: 9.4 g/dL — ABNORMAL LOW (ref 12.0–15.0)
MCH: 29.9 pg (ref 26.0–34.0)
MCHC: 31.8 g/dL (ref 30.0–36.0)
MCV: 94.3 fL (ref 78.0–100.0)
Platelets: 485 10*3/uL — ABNORMAL HIGH (ref 150–400)
RBC: 3.14 MIL/uL — AB (ref 3.87–5.11)
RDW: 14.5 % (ref 11.5–15.5)
WBC: 17.3 10*3/uL — AB (ref 4.0–10.5)

## 2016-04-25 LAB — BASIC METABOLIC PANEL
Anion gap: 10 (ref 5–15)
BUN: 6 mg/dL (ref 6–20)
CHLORIDE: 96 mmol/L — AB (ref 101–111)
CO2: 33 mmol/L — ABNORMAL HIGH (ref 22–32)
Calcium: 8.4 mg/dL — ABNORMAL LOW (ref 8.9–10.3)
Creatinine, Ser: 0.46 mg/dL (ref 0.44–1.00)
GFR calc non Af Amer: >60 mL/min (ref >60)
Glucose, Bld: 150 mg/dL — ABNORMAL HIGH (ref 65–99)
POTASSIUM: 4.1 mmol/L (ref 3.5–5.1)
SODIUM: 139 mmol/L (ref 135–145)

## 2016-04-25 MED ORDER — GUAIFENESIN ER 600 MG PO TB12
1200.0000 mg | ORAL_TABLET | Freq: Two times a day (BID) | ORAL | Status: DC
  Administered 2016-04-25 – 2016-04-26 (×3): 1200 mg via ORAL
  Filled 2016-04-25 (×5): qty 2

## 2016-04-25 MED ORDER — FUROSEMIDE 10 MG/ML IJ SOLN
40.0000 mg | Freq: Once | INTRAMUSCULAR | Status: DC
  Filled 2016-04-25: qty 4

## 2016-04-25 NOTE — Progress Notes (Signed)
PROGRESS NOTE   Heidi [name]  [id number] DOB: Jul 24, 1986 DOA: 04/10/2016 PCP: No primary care provider on file.  Outpatient Specialists: Oncology: Dr. Irene Limbo   Subjective: Denies any new complaints, Saturdays and the upper 90s on 4 L of oxygen. Continue to monitor, le BCT at 19.3 today.  Brief Narrative:  30 yo female was admitted in March 2017 with respiratory failure from pneumonia and ARDS. She was found to have diffuse adenopathy >> Rt axillary bx showed Hodgkin's lymphoma. She was set up for outpt follow up with oncology. On 4/15 she presented with worsening dyspnea. CXR and CT chest showed progressive Rt >> Lt ASD. She was started on Abx for HCAP and supplemental oxygen. On 4/18 she developed worsening mental status. This was associated with progressive hypoxia and hypercapnia with respiratory acidosis. She was tried on BiPAP w/o improvement and required intubation. Patient was subsequently transferred to [facility] service. Patient finished a ten-day course of antibiotic treatment and underwent bronchoscopy with biopsies and BAL. Biopsies consistent with mixed cellularity Hodgkin's lymphoma. Oncology was consulted and followed the patient throughout the hospitalization. Patient was started on chemotherapy per oncology. Patient subsequently extubated 04/18/2016 and currently on 6 L nasal cannula as well as bronchodilators. Patient transferred to medicine service 04/20/2016.  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  Patient status post 10 days antibiotics for probable HCAP. HIV negative. Hep C and hep B serologies negative.  Has had her chemotherapy, Neupogen stopped, monitor white blood cells, per oncology over the weekend.  Fever Had low-grade fever of 100.1, unsure if this is secondary to infection or part of her lymphoma B symptoms. Continue to monitor patient as she will have low white blood cells over the weekend. Continue to monitor patient over the weekend, still has low-grade fever.  Acute respiratory failure with hypoxia secondary to Hodgkin's lymphoma and questionable HCAP Status post vent-dependent respiratory failure. Patient was extubated and currently on 6 L nasal cannula.  Patient status post 10 days antibiotic treatment for HCAP.  BAL cultures negative to date. Continue Pulmicort, DuoNeb nebs, IV Lasix. Placed on Hycodan as needed. Follow. Oxygen weaned down to 4 L.  HCAP Status post bronchoscopy 04/14/2016 with BA cultures with no growth to date.  Blood cultures negative 2. Urine strep pneumococcus antigen is negative.  Patient was on 6 L nasal cannula. Status post 10 days of antibiotics treatment, still needs about 4-5 L of oxygen.  Tachycardia Sinus tachycardia, likely secondary to her hypoxia and labored breathing. Obtain 12-lead EKG to rule out tachyarrhythmias, started on low-dose of metoprolol.  Hypokalemia Likely secondary to diuretics. Repleted.  Volume overload CT abdomen and pelvis with persistent effusions, infiltrates and pulmonary nodules, diffuse body wall edema suggesting anasarca and moderate free pelvic fluid. She is on IV Lasix, fluid overload appears to be resolved. Lasix discontinued on 4/27. Repeat Lasix today.   Leukocytosis  Likely secondary to problem  #1 and Neupogen, per oncology continue the planned 5 doses.  Abdominal distention CT abdomen and pelvis with mesenteric and retroperitoneal lymphadenopathy as well as suggesting anasarca. Patient on IV Lasix. Patient received chemotherapy. This is resolved.   Protein calorie malnutrition Patient started on a diet. PPI.  Acute metabolic encephalopathy Likely secondary to problem #1 and 2. Improved.  Possible right pleural effusion Status post thoracentesis 04/19/2016 which was aborted as 0 pleural fluid was obtained. Follow.   DVT prophylaxis: Lovenox Code Status: Full Code Family Communication: Updated patient. No family at bedside. Disposition Plan: On telemetry, home likely after the weekend.   Consultants:   PCCM: Dr Halford Chessman 04/13/2016  Oncology:Dr Kale  Procedures:   Thoracentesis 04/19/2016 procedure aborted as 0 pleural fluid was obtained 4/15 CT angio chest >> near complete consolidation Rt lung and central Lt lung, mediastinal LAN 4/19 Bronchoscopy  4/21 CT abd/pelvis >> anasarca. No significant change 4/18 ETT >> 4/23 4/18 Lt IJ CVL >>    Antimicrobials:   IV cefepime 04/10/2016>>>> 04/12/2016  IV vancomycin 04/10/2016>>>> 04/19/2016   Objective: Filed Vitals:   04/24/16 2210 04/25/16 0556 04/25/16 0903 04/25/16 0908  BP:  130/75  132/74  Pulse:  20  102  Temp:  98.3 F (36.8 C)    TempSrc:  Oral    Resp:  20    Height:      Weight:      SpO2: 98% 96% 99%     Intake/Output Summary (Last 24 hours) at 04/25/16 1025 Last data filed at 04/25/16 0527  Gross per 24 hour  Intake    820 ml  Output      0 ml  Net    820 ml   Filed Weights   04/18/16 0200 04/19/16 0500 04/22/16 1201  Weight: 77.9 kg (171 lb 11.8 oz) 72.6 kg (160 lb 0.9 oz) 70 kg (154 lb 5.2 oz)    Examination:  General exam: Appears calm and comfortable, on 6 L nasal cannula.  Respiratory system: Some coarse breath sounds. Minimal expiratory wheezing. Decreased breath sounds in  the bases.  Cardiovascular system: S1 & S2 heard, RRR. No JVD, murmurs, rubs, gallops or clicks. Trace -1 + BLE edema.   Gastrointestinal system: Abdomen is mildly distended, soft and nontender. No organomegaly or masses felt. Normal bowel sounds heard. Central nervous system: Alert and oriented. No focal neurological deficits. Extremities: Symmetric 5 x 5 power. Skin: No rashes, lesions or ulcers Psychiatry: Judgement and insight appear normal. Mood & affect appropriate.     Data Reviewed: I have personally reviewed following labs and imaging studies  CBC:  Recent Labs Lab 04/21/16 0455 04/22/16 0430 04/23/16 0434 04/24/16 0430 04/25/16 0512  WBC 50.0* 41.3* 31.2* 23.2* 17.3*  NEUTROABS 50.0*  --   --   --   --   HGB 9.3* 9.7* 9.5* 9.3* 9.4*  HCT 28.5* 30.2* 30.0* 29.6* 29.6*  MCV 93.1 91.2 93.8 91.9 94.3  PLT 556* 559* 562* 514* [id number]*   Basic Metabolic Panel:  Recent Labs Lab 04/21/16 0455 04/22/16 0430 04/23/16 0434 04/24/16 0430 04/25/16 0512  NA 140 140 140 141 139  K 3.6 4.2 4.2 4.3 4.1  CL 93* 95* 98* 96* 96*  CO2 41* 38* 34* 37* 33*  GLUCOSE 77 95 129* 113* 150*  BUN 7 6 6  <5* 6  CREATININE <0.30* 0.34* 0.39* 0.38* 0.46  CALCIUM 8.1* 8.7* 8.5* 8.9 8.4*  MG 1.7  --   --   --   --  GFR: Estimated Creatinine Clearance: 90.9 mL/min (by C-G formula based on Cr of 0.46). Liver Function Tests:  Recent Labs Lab 04/19/16 1100 04/21/16 0455  AST 22 15  ALT 22 17  ALKPHOS 64 83  BILITOT 0.5 0.3  PROT 4.7* 4.7*  ALBUMIN 1.8* 2.0*   No results for input(s): LIPASE, AMYLASE in the last 168 hours. No results for input(s): AMMONIA in the last 168 hours. Coagulation Profile: No results for input(s): INR, PROTIME in the last 168 hours. Cardiac Enzymes: No results for input(s): CKTOTAL, CKMB, CKMBINDEX, TROPONINI in the last 168 hours. BNP (last 3 results) No results for input(s): PROBNP in the last 8760 hours. HbA1C: No results for input(s): HGBA1C in the  last 72 hours. CBG:  Recent Labs Lab 04/21/16 1542 04/21/16 1955 04/21/16 2332 04/22/16 0446 04/22/16 0827  GLUCAP 124* 144* 178* 85 145*   Lipid Profile: No results for input(s): CHOL, HDL, LDLCALC, TRIG, CHOLHDL, LDLDIRECT in the last 72 hours. Thyroid Function Tests: No results for input(s): TSH, T4TOTAL, FREET4, T3FREE, THYROIDAB in the last 72 hours. Anemia Panel: No results for input(s): VITAMINB12, FOLATE, FERRITIN, TIBC, IRON, RETICCTPCT in the last 72 hours. Urine analysis:    Component Value Date/Time   COLORURINE AMBER BIOCHEMICALS MAY BE AFFECTED BY COLOR* 08/01/2009 1305   APPEARANCEUR CLEAR 08/01/2009 1305   LABSPEC 1.029 08/01/2009 1305   PHURINE 6.5 08/01/2009 1305   GLUCOSEU NEGATIVE 08/01/2009 1305   HGBUR NEGATIVE 08/01/2009 1305   BILIRUBINUR SMALL* 08/01/2009 1305   KETONESUR 15* 08/01/2009 1305   PROTEINUR NEGATIVE 08/01/2009 1305   UROBILINOGEN 1.0 08/01/2009 1305   NITRITE NEGATIVE 08/01/2009 1305   LEUKOCYTESUR SMALL* 08/01/2009 1305   Sepsis Labs: @LABRCNTIP (procalcitonin:4,lacticidven:4)  ) No results found for this or any previous visit (from the past 240 hour(s)).       Radiology Studies: No results found.      Scheduled Meds: . antiseptic oral rinse  7 mL Mouth Rinse BID  . budesonide (PULMICORT) nebulizer solution  0.5 mg Nebulization BID  . enoxaparin (LOVENOX) injection  40 mg Subcutaneous QHS  . ipratropium  0.5 mg Nebulization QID  . levalbuterol  1.25 mg Nebulization QID  . metoprolol tartrate  50 mg Oral BID  . pantoprazole  40 mg Oral Daily   Continuous Infusions:     LOS: 15 days    Time spent: 45 mins    Bralen Wiltgen A, MD [facility] Pager [phone]  If 7PM-7AM, please contact night-coverage [email or web address] Password [email or web address] 04/25/2016, 10:25 AM

## 2016-04-26 ENCOUNTER — Telehealth: Payer: Self-pay

## 2016-04-26 LAB — CBC
HEMATOCRIT: 29.9 % — AB (ref 36.0–46.0)
Hemoglobin: 9.6 g/dL — ABNORMAL LOW (ref 12.0–15.0)
MCH: 30 pg (ref 26.0–34.0)
MCHC: 32.1 g/dL (ref 30.0–36.0)
MCV: 93.4 fL (ref 78.0–100.0)
Platelets: 421 10*3/uL — ABNORMAL HIGH (ref 150–400)
RBC: 3.2 MIL/uL — ABNORMAL LOW (ref 3.87–5.11)
RDW: 14.4 % (ref 11.5–15.5)
WBC: 13.2 10*3/uL — ABNORMAL HIGH (ref 4.0–10.5)

## 2016-04-26 LAB — BASIC METABOLIC PANEL
ANION GAP: 11 (ref 5–15)
BUN: 7 mg/dL (ref 6–20)
CHLORIDE: 95 mmol/L — AB (ref 101–111)
CO2: 33 mmol/L — ABNORMAL HIGH (ref 22–32)
Calcium: 8.5 mg/dL — ABNORMAL LOW (ref 8.9–10.3)
Creatinine, Ser: 0.42 mg/dL — ABNORMAL LOW (ref 0.44–1.00)
GFR calc Af Amer: >60 mL/min (ref >60)
GLUCOSE: 138 mg/dL — AB (ref 65–99)
POTASSIUM: 4.3 mmol/L (ref 3.5–5.1)
SODIUM: 139 mmol/L (ref 135–145)

## 2016-04-26 MED ORDER — METOPROLOL TARTRATE 50 MG PO TABS: 50.0000 mg | ORAL_TABLET | Freq: Two times a day (BID) | ORAL | Status: DC

## 2016-04-26 NOTE — Care Management Note (Signed)
Case Management Note  Patient Details  Name: Heidi [name] MRN: [id number] Date of Birth: [birth year]  Subjective/Objective: Qualifies for home 02-[facility] dme rep Lecretia aware-will deliver to rm @ d/c today.MD notified for home 02 orders. Contacted [facility] reps for [place] pcp appt.                  Action/Plan:d/c home w/home 02.   Expected Discharge Date:                  Expected Discharge Plan:  Home/Self Care  In-House Referral:     Discharge planning Services  CM Consult, [facility]  Post Acute Care Choice:    Choice offered to:     DME Arranged:  Oxygen DME Agency:  [place]:    [facility] Agency:     Status of Service:  Completed, signed off  Medicare Important Message Given:    Date Medicare IM Given:    Medicare IM give by:    Date Additional Medicare IM Given:    Additional Medicare Important Message give by:     If discussed at [place] of Stay Meetings, dates discussed:    Additional Comments:  Dessa Phi, RN 04/26/2016, 11:37 AM

## 2016-04-26 NOTE — Discharge Summary (Signed)
Physician Discharge Summary  Heidi [name] [id number] DOB: 1986/04/19 DOA: 04/10/2016  PCP: No primary care provider on file.  Admit date: 04/10/2016 Discharge date: 04/26/2016  Time spent: 40 minutes  Recommendations for Outpatient Follow-up:  1. Follow-up with the [facility] tomorrow at 04/27/16.   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 [facility]) Active Problems:   Acute respiratory failure with hypoxia (HCC)   Leukocytosis   HCAP (healthcare-associated pneumonia)   Acute respiratory failure (HCC)   Lymphoma (HCC)   Hyponatremia   Thrombocytosis (HCC)   Abdominal distension   Hodgkin lymphoma (HCC)   Hypokalemia   Volume overload   S/P bronchoscopy with biopsy   Encounter for central line placement   Discharge Condition: Stable  Diet recommendation: Regular  Filed Weights   04/19/16 0500 04/22/16 1201 04/26/16 0502  Weight: 72.6 kg (160 lb 0.9 oz) 70 kg (154 lb 5.2 oz) 64.6 kg (142 lb 6.7 oz)    History of present illness:  30 yo female was admitted in March 2017 with respiratory failure from pneumonia and ARDS. She was found to have diffuse adenopathy >> Rt axillary bx showed Hodgkin's lymphoma. She was set up for outpt follow up with oncology. On 4/15 she presented with worsening dyspnea. CXR and CT chest showed progressive Rt >> Lt ASD. She was started on Abx for HCAP and supplemental oxygen. On 4/18 she developed worsening mental status. This was associated with progressive hypoxia and hypercapnia with respiratory acidosis. She was tried on BiPAP w/o improvement and required intubation. Patient was subsequently transferred to [facility] service. Patient finished a ten-day course of antibiotic treatment and underwent bronchoscopy with biopsies and BAL. Biopsies consistent with mixed cellularity Hodgkin's lymphoma. Oncology was consulted and followed the patient throughout the  hospitalization. Patient was started on chemotherapy per oncology. Patient subsequently extubated 04/18/2016 and currently on 6 L nasal cannula as well as bronchodilators.   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  Patient status post 10 days antibiotics for probable HCAP. HIV negative. Hep C and hep B serologies negative.  Has had her chemotherapy, Neupogen stopped. WBC was 13.2 on discharge, patient follow-up with oncology, Dr. Irene Limbo as out patient tomorrow.  Fever Had low-grade fever of 100.1 on 4/27, unsure if this is secondary to infection or part of her lymphoma B symptoms. No evidence of new infection, this is likely part of her lymphoma B symptoms.  Acute respiratory failure with hypoxia secondary to Hodgkin's lymphoma and questionable HCAP Status post vent-dependent respiratory failure. Patient was extubated and currently on 6 L nasal cannula.  Patient status post 10 days antibiotic treatment for HCAP.  BAL cultures negative to date. Continue Pulmicort, DuoNeb nebs, IV Lasix. Placed on Hycodan as needed. Follow. Oxygen weaned down to 4 L. she needs 4 L of oxygen on discharge.  HCAP Status post bronchoscopy 04/14/2016 with BA cultures with no growth to date.  Blood cultures negative 2. Urine strep pneumococcus antigen is negative.  Patient was on 6 L nasal cannula. Status post 10 days of antibiotics treatment, still needs about 4 L of oxygen.  Tachycardia Sinus tachycardia, likely secondary to her hypoxia and labored breathing. Obtain 12-lead EKG to rule out tachyarrhythmias, discharged on 50 mg of metoprolol twice a day  Hypokalemia Likely secondary to diuretics. Repleted.  Volume overload CT abdomen and pelvis with persistent effusions, infiltrates and pulmonary  nodules, diffuse body wall edema suggesting anasarca and moderate free pelvic fluid. She is on IV Lasix, fluid overload appears to be resolved. Lasix discontinued on 4/27.  Leukocytosis Likely secondary to problem #1 and Neupogen, per oncology continue the planned 5 doses.  Abdominal distention CT abdomen and pelvis with mesenteric and retroperitoneal lymphadenopathy as well as suggesting anasarca. Patient on IV Lasix. Patient received chemotherapy. This is resolved.   Protein calorie malnutrition Patient started on a diet. PPI.  Acute metabolic encephalopathy Likely secondary to problem #1 and 2. Improved.  Possible right pleural effusion Status post thoracentesis 04/19/2016 which was aborted as 0 pleural fluid was obtained. Follow.   Procedures:  None  Consultations:  Was under PCCM till 04/20/2016.  Hematology/oncology Dr. Irene Limbo  Discharge Exam: Filed Vitals:   04/25/16 2046 04/26/16 0502  BP: 132/77 141/88  Pulse: 116 94  Temp: 98.2 F (36.8 C) 98.1 F (36.7 C)  Resp: 20 22  General: Alert and awake, oriented x3, not in any acute distress. HEENT: anicteric sclera, pupils reactive to light and accommodation, EOMI CVS: S1-S2 clear, no murmur rubs or gallops Chest: clear to auscultation bilaterally, no wheezing, rales or rhonchi Abdomen: soft nontender, nondistended, normal bowel sounds, no organomegaly Extremities: no cyanosis, clubbing or edema noted bilaterally Neuro: Cranial nerves II-XII intact, no focal neurological deficits  Discharge Instructions   Discharge Instructions    Diet - low sodium heart healthy    Complete by:  As directed      Increase activity slowly    Complete by:  As directed      SCHEDULING COMMUNICATION    Complete by:  As directed   Chemotherapy Appointment - 2.5 hr     TREATMENT CONDITIONS    Complete by:  As directed   Patient should have CBC & CMP within 7 days prior to chemotherapy administration. NOTIFY MD IF: ANC < 1500,  Hemoglobin < 8, PLT < 100,000,  Total Bili > 1.5, Creatinine > 1.5, ALT & AST > 80 or if patient has unstable vital signs: Temperature > 38.5, SBP > 180 or < 90, RR > 30 or HR > 100.          Current Discharge Medication List    START taking these medications   Details  metoprolol (LOPRESSOR) 50 MG tablet Take 1 tablet (50 mg total) by mouth 2 (two) times daily. Qty: 60 tablet, Refills: 0      CONTINUE these medications which have NOT CHANGED   Details  guaiFENesin (MUCINEX) 600 MG 12 hr tablet Take 1,200 mg by mouth 2 (two) times daily as needed for cough or to loosen phlegm.    levalbuterol (XOPENEX HFA) 45 MCG/ACT inhaler Inhale 2 puffs into the lungs every 6 (six) hours as needed for wheezing. Qty: 1 Inhaler, Refills: 12      STOP taking these medications     labetalol (NORMODYNE) 100 MG tablet      amoxicillin-clavulanate (AUGMENTIN) 875-125 MG tablet        No Known Allergies Follow-up Information    Follow up with Sullivan Lone, MD On 04/27/2016.   Specialties:  Hematology, Oncology   Contact information:   [place] [address] [phone]        The results of significant diagnostics from this hospitalization (including imaging, microbiology, ancillary and laboratory) are listed below for reference.    Significant Diagnostic Studies: Ct Abdomen  Pelvis Wo Contrast  04/16/2016  CLINICAL DATA:  Lymphoma, respiratory failure, anasarca. EXAM: CT ABDOMEN AND PELVIS WITHOUT CONTRAST TECHNIQUE: Multidetector CT imaging of the abdomen and pelvis was performed following the standard protocol without IV contrast. COMPARISON:  Chest CT 04/10/2016. FINDINGS: Lower chest: Persistent bilateral pleural effusions, bilateral infiltrates and pulmonary nodules. Hepatobiliary: No obvious hepatic lesions but study is limited without IV contrast. The gallbladder is grossly normal. Pancreas: No obvious mass, inflammation or ductal dilatation. Spleen: Normal size.  No obvious  lesions. Adrenals/Urinary Tract: The adrenal glands and kidneys are grossly normal. Stomach/Bowel: There is an NG tube in the stomach. The duodenum, small bowel and colon are grossly normal. Vascular/Lymphatic: Retroperitoneal lymphadenopathy. 2 cm retroperitoneal lymph node on image number 42. The aorta is normal in caliber. No atherosclerotic calcifications. Reproductive: The uterus and ovaries are grossly normal. Other: The bladder contains a Foley catheter. There is a moderate amount of free pelvic fluid. No inguinal adenopathy. Diffuse body wall edema suggesting anasarca. Musculoskeletal: No significant bony findings. IMPRESSION: 1. Limited examination due to lack of IV contrast, artifact from the right arm and motion. 2. Persistent effusions, infiltrates and pulmonary nodules. 3. Diffuse body wall edema suggesting anasarca. 4. Mesenteric and retroperitoneal lymphadenopathy. 5. Moderate free pelvic fluid. Electronically Signed   By: Marijo Sanes M.D.   On: 04/16/2016 11:37   Dg Chest 1 View  04/11/2016  CLINICAL DATA:  Shortness of breath.  History of lymphoma. EXAM: CHEST 1 VIEW COMPARISON:  Chest radiograph 04/10/2016; chest CT 04/10/2016 FINDINGS: Grossly unchanged diffuse opacification of the right hemi thorax with worsening opacities involving the left mid and upper lung. No definite pleural effusion or pneumothorax. Regional skeleton is unremarkable. IMPRESSION: Interval worsening opacities left upper lung with persistent near complete opacification of the right hemi thorax, findings overall compatible with extensive multi focal pneumonia. Known mediastinal and hilar adenopathy. Electronically Signed   By: Lovey Newcomer M.D.   On: 04/11/2016 17:03   Dg Chest 2 View  04/21/2016  CLINICAL DATA:  Shortness of breath, history of lymphoma EXAM: CHEST  2 VIEW COMPARISON:  04/20/2016 FINDINGS: Cardiomediastinal silhouette is stable. Again noted extensive multifocal nodular consolidation bilaterally right  greater than left without change in aeration. Left IJ central line with tip in upper SVC is unchanged in position. No convincing pulmonary edema. IMPRESSION: Again noted extensive multifocal nodular consolidation bilaterally right greater than left without change in aeration. Left IJ central line with tip in upper SVC is unchanged in position. No convincing pulmonary edema. Electronically Signed   By: Lahoma Crocker M.D.   On: 04/21/2016 11:04   Dg Chest 2 View  04/20/2016  CLINICAL DATA:  Shortness of breath, cough, dizziness.  Lymphoma. EXAM: CHEST  2 VIEW COMPARISON:  04/19/2016 FINDINGS: Dense consolidation throughout much of the right lung. Patchy nodular opacities in the mid and lower lung. Mild cardiomegaly. No visible effusions. No acute bony abnormality. Left central line tip in the SVC, unchanged. IMPRESSION: Extensive bilateral nodular and masslike airspace disease, right much greater than left. No real change since prior study. This presumably represents multifocal pneumonia Electronically Signed   By: Rolm Baptise M.D.   On: 04/20/2016 09:49   Dg Chest 2 View  04/10/2016  CLINICAL DATA:  Patient with shortness of breath for multiple weeks. Recently diagnosed with lymphoma. EXAM: CHEST  2 VIEW COMPARISON:  Chest CT 03/16/2016; chest radiograph 03/22/2016. FINDINGS: Multiple monitoring leads overlie the patient. Stable cardiac and mediastinal contours with extensive bilateral hilar and  mediastinal adenopathy. Significant worsening of consolidation within the right lung. Persistent consolidation within the left mid lung with associated small nodular opacities. Possible right pleural effusion. No pneumothorax. Regional skeleton is unremarkable. IMPRESSION: Interval worsening of consolidation throughout the majority of the right lung which may represent malignancy, postobstructive pneumonia and/or effusion. Persistent hilar and mediastinal adenopathy. Unchanged scattered opacities and consolidation within  the left lung. Electronically Signed   By: Lovey Newcomer M.D.   On: 04/10/2016 19:32   Ct Angio Chest Pe W/cm &/or Wo Cm  04/10/2016  CLINICAL DATA:  Acute onset of worsening shortness of breath. Bilateral leg swelling. Recently diagnosed with Hodgkin's lymphoma. Initial encounter. EXAM: CT ANGIOGRAPHY CHEST WITH CONTRAST TECHNIQUE: Multidetector CT imaging of the chest was performed using the standard protocol during bolus administration of intravenous contrast. Multiplanar CT image reconstructions and MIPs were obtained to evaluate the vascular anatomy. CONTRAST:  100 mL of Isovue 370 IV contrast COMPARISON:  CT of the chest performed 03/16/2016, and chest radiograph performed 04/10/2016 FINDINGS: There is no evidence of pulmonary embolus. There is near complete dense consolidation of the right lung, and central consolidation involving the left upper lobe, with additional scattered nodular opacities throughout the left lung. This is significantly worsened from the prior CT, reflecting worsening bilateral pneumonia. On correlation with right axillary lymph node biopsy results, the patient was recently diagnosed with classic Hodgkin's lymphoma. Hodgkin's lymphoma typically would not narrow the airway to the extent required for postobstructive pneumonia, and is likely unrelated to the patient's presentation with diffuse bilateral pneumonia. There is no evidence of pleural effusion or pneumothorax. Extensive enlarged mediastinal nodes are seen, particularly anteriorly, measuring up to 1.8 cm in short axis. Right axillary nodes measure up to 2.4 cm in short axis. These are compatible with the patient's Hodgkin's lymphoma. A small 3.8 cm collection of fluid at the right axilla likely reflects a postoperative seroma, status post recent right hilar lymph node biopsy. No pericardial effusion is identified. The great vessels are grossly unremarkable in appearance. The visualized portions of the thyroid gland are  unremarkable in appearance. The visualized portions of the liver and spleen are unremarkable. No acute osseous abnormalities are seen. Review of the MIP images confirms the above findings. IMPRESSION: 1. No evidence of pulmonary embolus. 2. Near complete dense consolidation of the right lung, and central consolidation involving the left upper lobe, with scattered nodular opacities throughout the left lung. This is significantly worsened from the prior CT, reflecting worsening bilateral pneumonia. 3. The patient was recently diagnosed with classic Hodgkin's lymphoma for right axillary lymph node biopsy results. The Hodgkin's lymphoma is likely unrelated to the patient's diffuse bilateral pneumonia, as lymph nodes typically do not obstruct the tracheobronchial tree. Enlarged mediastinal and right axillary nodes reflect the patient's lymphoma. 4. 3.8 cm collection of fluid at the right axilla likely reflects a postoperative seroma, status post recent right hilar lymph node biopsy. These results were called by telephone at the time of interpretation on 04/10/2016 at 9:19 pm to [facility], who verbally acknowledged these results. Electronically Signed   By: Garald Balding M.D.   On: 04/10/2016 21:26   Dg Chest Port 1 View  04/19/2016  CLINICAL DATA:  Unsuccessful thoracentesis EXAM: PORTABLE CHEST 1 VIEW COMPARISON:  04/17/2016 FINDINGS: Endotracheal and NG tubes have been removed. Left jugular central venous catheter is stable with its tip at the upper SVC. Extensive bilateral consolidation of right groin left has improved. There is no evidence of pneumothorax. IMPRESSION:  No evidence of pneumothorax Bilateral airspace disease right greater than left has improved Extubated. Electronically Signed   By: Marybelle Killings M.D.   On: 04/19/2016 14:36   Dg Chest Port 1 View  04/17/2016  CLINICAL DATA:  Respiratory failure. Hx asthma, Hodgkin lymphoma, htn EXAM: PORTABLE CHEST 1 VIEW COMPARISON:  04/16/2016  FINDINGS: Endotracheal tube is in place, tip 5.1 cm above the carina. Nasogastric tube is in place with tip off the film beyond the gastroesophageal junction. Left IJ central line tip overlies the level of the superior vena cava. There are dense opacities in the lungs bilaterally, right greater than left. Bilateral pleural effusions are present. IMPRESSION: 1. No significant change in the appearance of lines and tubes. 2. Stable appearance of significant bilateral infiltrates, right greater than left. Electronically Signed   By: Nolon Nations M.D.   On: 04/17/2016 07:38   Dg Chest Port 1 View  04/16/2016  CLINICAL DATA:  Respiratory failure healthcare associated pneumonia intubated patient EXAM: PORTABLE CHEST 1 VIEW COMPARISON:  Portable chest x-ray of April 15, 2016 FINDINGS: The right hemi thorax remains largely obscured with only a small amount of aerated upper lobe visible. The left lung is better aerated but increased density has developed in the retrocardiac region and there is further obscuration of the left hemidiaphragm. A small portion of the left heart border is visible. The pulmonary vascularity is indistinct. The endotracheal tube tip lies 6.4 cm above the carina. The esophagogastric tube tip projects below the inferior margin of the image. The left internal jugular venous catheter tip projects over the proximal SVC. IMPRESSION: Near total opacification of the right hemi thorax consistent with pneumonia and pleural effusion. Increasing opacity on the left consistent with progressive pneumonia. No significant left-sided pleural effusion is visible but is likely obscured. Electronically Signed   By: David  Martinique M.D.   On: 04/16/2016 07:05   Dg Chest Port 1 View  04/15/2016  CLINICAL DATA:  Respiratory failure, evaluate endotracheal tube position EXAM: PORTABLE CHEST 1 VIEW COMPARISON:  04/14/2016 FINDINGS: Endotracheal tube unchanged with tip 5.5 cm above the carina. Orogastric and left IJ  support devices stable. Extensive opacification over the right lung stable. Patchy opacification over the left lung stable. IMPRESSION: No change from prior study. Electronically Signed   By: Skipper Cliche M.D.   On: 04/15/2016 07:00   Dg Chest Port 1 View  04/14/2016  CLINICAL DATA:  30 year old female with history of right lower lobe biopsy. EXAM: PORTABLE CHEST 1 VIEW COMPARISON:  Chest x-ray 04/14/2016. FINDINGS: An endotracheal tube is in place with tip 5.8 cm above the carina. There is a left-sided internal jugular central venous catheter with tip terminating in the proximal superior vena cava. A nasogastric tube is seen extending into the stomach, however, the tip of the nasogastric tube extends below the lower margin of the image. Again noted is extensive airspace consolidation throughout the lungs bilaterally (right greater than left), with some improving aeration in the right upper lobe compared to the prior examination. Small left pleural effusion. Possible right pleural effusion, difficult to judge given the extent of right-sided airspace consolidation on this single view examination. No evidence of pulmonary edema. Heart size is upper limits of normal. Upper mediastinal contours are within normal limits. IMPRESSION: 1. Slight improvement in aeration in the right upper lobe. Otherwise, the appearance the chest is similar, again most compatible with multilobar pneumonia. 2. Support apparatus, as above. Electronically Signed   By: Mauri Brooklyn.D.  On: 04/14/2016 13:48   Dg Chest Port 1 View  04/14/2016  CLINICAL DATA:  Endotracheal tube position, Hodgkin lymphoma. EXAM: PORTABLE CHEST 1 VIEW COMPARISON:  04/13/2016 and CT chest 04/10/2016. FINDINGS: Endotracheal tube terminates approximately 6.5 cm above carina, stable. Nasogastric tube is followed into the stomach. Left IJ central line tip projects at the junction of the brachiocephalic veins. Near complete opacification of the right  hemithorax with leftward shift of the heart and mediastinum, as before. Patchy airspace consolidation bilaterally. Probable small left pleural effusion. IMPRESSION: 1. Dense consolidation throughout the right lung with patchy areas of consolidation in the left lung, likely due to pneumonia. 2. Possible small left pleural effusion. Electronically Signed   By: Lorin Picket M.D.   On: 04/14/2016 07:07   Dg Chest Port 1 View  04/13/2016  CLINICAL DATA:  Status post central catheter placement. Hodgkin's lymphoma. EXAM: PORTABLE CHEST 1 VIEW COMPARISON:  Study obtained earlier in the day as well as chest CT April 10, 2016 FINDINGS: Central catheter tip is in the superior vena cava near the junction with the left innominate vein. Endotracheal tube tip is 4.4 cm above the carina. Nasogastric tube tip and side port are below the diaphragm. No pneumothorax. There is extensive consolidation with probable superimposed effusion, causing opacification of most of the right hemithorax, stable. There are areas of consolidation in the left mid lung with slightly less overall opacity compared to earlier in the day. The heart size and pulmonary vascularity are normal. It is difficult to assess for adenopathy given the overlying areas of consolidation. No bone lesions are evident. IMPRESSION: Tube and catheter positions as described without pneumothorax. Widespread pneumonia stable on the right with slightly less overall opacity on the left compared to earlier in the day. Confluent opacity is present in the left mid lung focally. Stable cardiac silhouette. Electronically Signed   By: Lowella Grip III M.D.   On: 04/13/2016 11:20   Dg Chest Port 1 View  04/13/2016  CLINICAL DATA:  Endotracheal orogastric tube placement EXAM: PORTABLE CHEST 1 VIEW COMPARISON:  Two days ago FINDINGS: Endotracheal tube tip just below the clavicular heads. An orogastric tube reaches the stomach. Bilateral pneumonia as demonstrated on recent  chest CT, nearly confluent throughout the right lung. Air bronchograms are intermittently seen. No evidence of air leak or cavitation. No visible significant pleural effusion. Normal heart size. IMPRESSION: 1. Unremarkable positioning new endotracheal or orogastric tubes. 2.  Bilateral pneumonia without progression since 2 days ago. Electronically Signed   By: Monte Fantasia M.D.   On: 04/13/2016 08:23   Dg C-arm Bronchoscopy  04/14/2016  CLINICAL DATA:  C-ARM BRONCHOSCOPY Fluoroscopy was utilized by the requesting physician.  No radiographic interpretation.    Microbiology: No results found for this or any previous visit (from the past 240 hour(s)).   Labs: Basic Metabolic Panel:  Recent Labs Lab 04/21/16 0455 04/22/16 0430 04/23/16 0434 04/24/16 0430 04/25/16 0512 04/26/16 0454  NA 140 140 140 141 139 139  K 3.6 4.2 4.2 4.3 4.1 4.3  CL 93* 95* 98* 96* 96* 95*  CO2 41* 38* 34* 37* 33* 33*  GLUCOSE 77 95 129* 113* 150* 138*  BUN 7 6 6  <5* 6 7  CREATININE <0.30* 0.34* 0.39* 0.38* 0.46 0.42*  CALCIUM 8.1* 8.7* 8.5* 8.9 8.4* 8.5*  MG 1.7  --   --   --   --   --    Liver Function Tests:  Recent Labs Lab 04/21/16 0455  AST 15  ALT 17  ALKPHOS 83  BILITOT 0.3  PROT 4.7*  ALBUMIN 2.0*   No results for input(s): LIPASE, AMYLASE in the last 168 hours. No results for input(s): AMMONIA in the last 168 hours. CBC:  Recent Labs Lab 04/21/16 0455 04/22/16 0430 04/23/16 0434 04/24/16 0430 04/25/16 0512 04/26/16 0454  WBC 50.0* 41.3* 31.2* 23.2* 17.3* 13.2*  NEUTROABS 50.0*  --   --   --   --   --   HGB 9.3* 9.7* 9.5* 9.3* 9.4* 9.6*  HCT 28.5* 30.2* 30.0* 29.6* 29.6* 29.9*  MCV 93.1 91.2 93.8 91.9 94.3 93.4  PLT 556* 559* 562* 514* 485* 421*   Cardiac Enzymes: No results for input(s): CKTOTAL, CKMB, CKMBINDEX, TROPONINI in the last 168 hours. BNP: BNP (last 3 results) No results for input(s): BNP in the last 8760 hours.  ProBNP (last 3 results) No results for  input(s): PROBNP in the last 8760 hours.  CBG:  Recent Labs Lab 04/21/16 1542 04/21/16 1955 04/21/16 2332 04/22/16 0446 04/22/16 0827  GLUCAP 124* 144* 178* 85 145*       Signed:  Tamaj Jurgens A MD.  [facility] 04/26/2016, 12:00 PM

## 2016-04-26 NOTE — Progress Notes (Signed)
[facility]    [facility] is providing the following services: Home Oxygen and Commode  If patient discharges after hours, please call [phone].   Linward Headland 04/26/2016, 1:44 PM

## 2016-04-26 NOTE — Progress Notes (Signed)
Occupational Therapy Treatment Patient Details Name: Heidi [name] MRN: [id number] DOB: July 10, 1986 Today's Date: 04/26/2016    History of present illness 30 yo female admitted with acute respiratory failure. Extubated 4/23. Hx of lymphoma   OT comments  Patient progressing towards OT goals. Reports she is going home today. Recommend 3 in 1 for home as patient reports the only toilet/bathroom is on the 2nd floor of her house. She will be staying on 1st floor and will need 3 in 1 for toileting on 1st floor. Notified case Freight forwarder. OT will continue to follow.   Follow Up Recommendations  Supervision/Assistance - 24 hour;No OT follow up    Equipment Recommendations  3 in 1 bedside comode    Recommendations for Other Services      Precautions / Restrictions Precautions Precautions: Fall       Mobility Bed Mobility Overal bed mobility: Independent to long sitting                Transfers                 General transfer comment: pt declined OOB during session; wanted to rest prior to discharge. Reports she has been getting up in the room.    Balance                                   ADL                                         General ADL Comments: Patient received long sitting in bed. Educated on role of OT. She reports she is being discharged home today. Family will assist at discharge. Patient demonstrated ability to reach all body parts from seated position with good flexibility to her feet. She will have O2 at home. We discussed being careful about O2 line as a potential trip hazard. She verbalized understanding. Patient requests 3 in 1 for home due to 1st floor of her house not having a bathroom or toilet. Notified case Freight forwarder.       Vision                     Perception     Praxis      Cognition   Behavior During Therapy: WFL for tasks assessed/performed Overall Cognitive Status: Within Functional Limits  for tasks assessed                       Extremity/Trunk Assessment               Exercises     Shoulder Instructions       General Comments      Pertinent Vitals/ Pain       Pain Assessment: No/denies pain  Home Living                                          Prior Functioning/Environment              Frequency Min 2X/week     Progress Toward Goals  OT Goals(current goals can now be found in the care plan section)  Progress towards OT goals: Progressing toward goals  Acute Rehab  OT Goals Patient Stated Goal: regain PLOF  Plan Discharge plan remains appropriate    Co-evaluation                 End of Session     Activity Tolerance Patient tolerated treatment well   Patient Left in bed;with call bell/phone within reach   Nurse Communication          Time: [place]:[id number] OT Time Calculation (min): 10 min  Charges: OT General Charges $OT Visit: 1 Procedure OT Treatments $Self Care/Home Management : 8-22 mins  Andrw Mcguirt A 04/26/2016, 11:53 AM

## 2016-04-26 NOTE — Telephone Encounter (Signed)
Message received from Dessa Phi, RN CM requesting a hospital follow up appointment at [facility] for the patient. An appointment was scheduled for 05/05/16 @ 1200 with Dr Janne Napoleon and the information was placed on the AVS.  Update provided to K. Mahabir, RN CM.

## 2016-04-26 NOTE — Progress Notes (Signed)
[name] Kitchen   HEMATOLOGY/ONCOLOGY INPATIENT PROGRESS NOTE  Date of Service: 04/26/2016  Inpatient Attending: .Heidi Monte, MD  SUBJECTIVE  Heidi [name] was seen today around noon. She notes that she continues to feel better. Her breathing continues to improve. Still having some cough. Eating well. Excited to go home today and see her 3 children. We discussed that she does have follow-up with [place] on Friday 04/30/2016 for her part B ABVD cycle 1. No neutropenia at this time. No fevers.  OBJECTIVE:  PHYSICAL EXAMINATION: . Filed Vitals:   04/25/16 2046 04/25/16 2236 04/25/16 2239 04/26/16 0502  BP: 132/77   141/88  Pulse: 116   94  Temp: 98.2 F (36.8 C)   98.1 F (36.7 C)  TempSrc: Oral   Oral  Resp: 20   22  Height:      Weight:    142 lb 6.7 oz (64.6 kg)  SpO2: 100% 97% 97% 95%   Filed Weights   04/19/16 0500 04/22/16 1201 04/26/16 0502  Weight: 160 lb 0.9 oz (72.6 kg) 154 lb 5.2 oz (70 kg) 142 lb 6.7 oz (64.6 kg)   .Body mass index is 27.35 kg/(m^2).   [name] Kitchen Wt Readings from Last 3 Encounters:  04/26/16 142 lb 6.7 oz (64.6 kg)  03/23/16 128 lb 8 oz (58.287 kg)  05/15/14 153 lb 11.2 oz (69.718 kg)   GENERAL:Awake alert and oriented 3 SKIN: No acute rashes OROPHARYNX: Moist mucous membranes  NECK: supple, no JVD LYMPH:  Small cervical and axillary lymphadenopathy  LUNGS: Bilateral coarse breath sounds with bilateral wheezes, improved air entry. HEART: regular rate & rhythm ABDOMEN: abdomen soft, nontender, normoactive bowel sounds Extremities 2+ pitting pedal edema bilaterally  NEURO: Awake alert and oriented 3, moving all 4 extremities. No overt focal neurological deficit.  MEDICAL HISTORY:  Past Medical History  Diagnosis Date  . Asthma   . Hodgkin lymphoma ([place])   . Hypertension     SURGICAL HISTORY: Past Surgical History  Procedure Laterality Date  . Axillary lymph node biopsy Right 03/19/2016    Procedure: AXILLARY LYMPH NODE BIOPSY;  Surgeon: Armandina Gemma, MD;   Location: [facility];  Service: General;  Laterality: Right;    SOCIAL HISTORY: Social History   Social History  . Marital Status: Single    Spouse Name: N/A  . Number of Children: N/A  . Years of Education: N/A   Occupational History  . Not on file.   Social History Main Topics  . Smoking status: Current Every Day Smoker -- 0.50 packs/day    Types: Cigarettes  . Smokeless tobacco: Not on file  . Alcohol Use: Yes     Comment: PT drinks 2 40 oz beer per day   . Drug Use: Not on file  . Sexual Activity: Not on file   Other Topics Concern  . Not on file   Social History Narrative    FAMILY HISTORY: No family history on file.  ALLERGIES:  has No Known Allergies.  MEDICATIONS:  Scheduled Meds: . antiseptic oral rinse  7 mL Mouth Rinse BID  . budesonide (PULMICORT) nebulizer solution  0.5 mg Nebulization BID  . enoxaparin (LOVENOX) injection  40 mg Subcutaneous QHS  . furosemide  40 mg Intravenous Once  . guaiFENesin  1,200 mg Oral BID  . ipratropium  0.5 mg Nebulization QID  . levalbuterol  1.25 mg Nebulization QID  . metoprolol tartrate  50 mg Oral BID  . pantoprazole  40 mg Oral Daily   Continuous Infusions:  PRN Meds:.acetaminophen (TYLENOL) oral liquid 160 mg/5 mL, alteplase, Cold Pack, fentaNYL (SUBLIMAZE) injection, heparin lock flush, heparin lock flush, HYDROcodone-homatropine, levalbuterol, [DISCONTINUED] ondansetron **OR** ondansetron (ZOFRAN) IV, sodium chloride flush, sodium chloride flush  REVIEW OF SYSTEMS:    10 Point review of Systems was done is negative except as noted above.   LABORATORY DATA:   . CBC Latest Ref Rng 04/26/2016 04/25/2016 04/24/2016  WBC 4.0 - 10.5 K/uL 13.2(H) 17.3(H) 23.2(H)  Hemoglobin 12.0 - 15.0 g/dL 9.6(L) 9.4(L) 9.3(L)  Hematocrit 36.0 - 46.0 % 29.9(L) 29.6(L) 29.6(L)  Platelets 150 - 400 K/uL 421(H) 485(H) 514(H)   . CMP Latest Ref Rng 04/26/2016 04/25/2016 04/24/2016  Glucose 65 - 99 mg/dL 138(H) 150(H) 113(H)  BUN 6 - 20  mg/dL 7 6 <5(L)  Creatinine 0.44 - 1.00 mg/dL 0.42(L) 0.46 0.38(L)  Sodium 135 - 145 mmol/L 139 139 141  Potassium 3.5 - 5.1 mmol/L 4.3 4.1 4.3  Chloride 101 - 111 mmol/L 95(L) 96(L) 96(L)  CO2 22 - 32 mmol/L 33(H) 33(H) 37(H)  Calcium 8.9 - 10.3 mg/dL 8.5(L) 8.4(L) 8.9  Total Protein 6.5 - 8.1 g/dL - - -  Total Bilirubin 0.3 - 1.2 mg/dL - - -  Alkaline Phos 38 - 126 U/L - - -  AST 15 - 41 U/L - - -  ALT 14 - 54 U/L - - -       Microscopic Comment LYMPHOMA Histologic type: Classical Hodgkin lymphoma, mixed cellularity type Grade (if applicable): N/A Flow cytometry: No monoclonal B cell population or abnormal T cell phenotype identified ([id number]). Immunohistochemical stains: LCA, cytokeratin AE1/AE3, S100, CD15 CD30, CD163, CD20, PAX-5, CD21, CD3, CD68, CD1a, CD43, CD4, CD5, CD8, ALK protein, CD45RO, CD79a, CD10, kappa and lambda with appropriate controls. Touch preps/imprints: Scanty but with scattered large atypical mononuclear and multilobated lymphoid appearing cells. Comments: The sections show diffuse effacement of the architecture by an atypical lymphohistiocytic process characterized associated with scattered foci of necrosis. In this background, scattered individual and small clusters of large atypical mononuclear and multilobated lymphoid appearing cells are seen displaying vesicular chromatin, variably prominent nucleoli and abundant amphophilic cytoplasm. Prominent sinusoidal pattern is not appreciated and areas of collagenous fibrosis are not seen. Admixed are small lymphocytes, eosinophils, and plasma cells to variable extent. To further evaluate this process, flow cytometric analysis was performed but failed to show any monoclonal B cell population or abnormal T cell phenotype. A large battery of immunohistochemical stains were performed and show 1 of 2 FINAL for Heidi [name], Heidi [name] [phone]) Microscopic Comment(continued) that the large atypical lymphoid  appearing cells are positive for CD30 and scattered cells for CD15. They are negative for CD3, CD20, LCA, PAX-5, ALK protein, CD10, CD4, CD43, CD45RO, CD5, CD8, CD79a, EMA, cytoplasmic kappa, cytoplasmic lambda. The lymphoid component in the background shows a mixture of T and B cells with predominance of T cells containing a mixture of CD4 and CD8 positive cells. The abundant histiocytic component in the background is positive for CD4, CD68 and CD163 and mostly negative for CD1a, S100 and CD21. No significant cytokeratin (AE1/AE3) positivity is identified. The overall histologic and immunophenotypic features are consistent with classical Hodgkin lymphoma which is best subclassified as mixed cellularity-type. (BNS:kh:ecj 03-24-16) Susanne Greenhouse MD Pathologist, Electronic Signature (Case signed 03/24/2016)    RADIOGRAPHIC STUDIES: I have personally reviewed the radiological images as listed and agreed with the findings in the report. Ct Abdomen Pelvis Wo Contrast  04/16/2016  CLINICAL DATA:  Lymphoma, respiratory failure, anasarca. EXAM: CT ABDOMEN  AND PELVIS WITHOUT CONTRAST TECHNIQUE: Multidetector CT imaging of the abdomen and pelvis was performed following the standard protocol without IV contrast. COMPARISON:  Chest CT 04/10/2016. FINDINGS: Lower chest: Persistent bilateral pleural effusions, bilateral infiltrates and pulmonary nodules. Hepatobiliary: No obvious hepatic lesions but study is limited without IV contrast. The gallbladder is grossly normal. Pancreas: No obvious mass, inflammation or ductal dilatation. Spleen: Normal size.  No obvious lesions. Adrenals/Urinary Tract: The adrenal glands and kidneys are grossly normal. Stomach/Bowel: There is an NG tube in the stomach. The duodenum, small bowel and colon are grossly normal. Vascular/Lymphatic: Retroperitoneal lymphadenopathy. 2 cm retroperitoneal lymph node on image number 42. The aorta is normal in caliber. No atherosclerotic  calcifications. Reproductive: The uterus and ovaries are grossly normal. Other: The bladder contains a Foley catheter. There is a moderate amount of free pelvic fluid. No inguinal adenopathy. Diffuse body wall edema suggesting anasarca. Musculoskeletal: No significant bony findings. IMPRESSION: 1. Limited examination due to lack of IV contrast, artifact from the right arm and motion. 2. Persistent effusions, infiltrates and pulmonary nodules. 3. Diffuse body wall edema suggesting anasarca. 4. Mesenteric and retroperitoneal lymphadenopathy. 5. Moderate free pelvic fluid. Electronically Signed   By: Marijo Sanes M.D.   On: 04/16/2016 11:37   Dg Chest 1 View  04/11/2016  CLINICAL DATA:  Shortness of breath.  History of lymphoma. EXAM: CHEST 1 VIEW COMPARISON:  Chest radiograph 04/10/2016; chest CT 04/10/2016 FINDINGS: Grossly unchanged diffuse opacification of the right hemi thorax with worsening opacities involving the left mid and upper lung. No definite pleural effusion or pneumothorax. Regional skeleton is unremarkable. IMPRESSION: Interval worsening opacities left upper lung with persistent near complete opacification of the right hemi thorax, findings overall compatible with extensive multi focal pneumonia. Known mediastinal and hilar adenopathy. Electronically Signed   By: Lovey Newcomer M.D.   On: 04/11/2016 17:03   Dg Chest 2 View  04/21/2016  CLINICAL DATA:  Shortness of breath, history of lymphoma EXAM: CHEST  2 VIEW COMPARISON:  04/20/2016 FINDINGS: Cardiomediastinal silhouette is stable. Again noted extensive multifocal nodular consolidation bilaterally right greater than left without change in aeration. Left IJ central line with tip in upper SVC is unchanged in position. No convincing pulmonary edema. IMPRESSION: Again noted extensive multifocal nodular consolidation bilaterally right greater than left without change in aeration. Left IJ central line with tip in upper SVC is unchanged in position. No  convincing pulmonary edema. Electronically Signed   By: Lahoma Crocker M.D.   On: 04/21/2016 11:04   Dg Chest 2 View  04/20/2016  CLINICAL DATA:  Shortness of breath, cough, dizziness.  Lymphoma. EXAM: CHEST  2 VIEW COMPARISON:  04/19/2016 FINDINGS: Dense consolidation throughout much of the right lung. Patchy nodular opacities in the mid and lower lung. Mild cardiomegaly. No visible effusions. No acute bony abnormality. Left central line tip in the SVC, unchanged. IMPRESSION: Extensive bilateral nodular and masslike airspace disease, right much greater than left. No real change since prior study. This presumably represents multifocal pneumonia Electronically Signed   By: Rolm Baptise M.D.   On: 04/20/2016 09:49   Dg Chest 2 View  04/10/2016  CLINICAL DATA:  Patient with shortness of breath for multiple weeks. Recently diagnosed with lymphoma. EXAM: CHEST  2 VIEW COMPARISON:  Chest CT 03/16/2016; chest radiograph 03/22/2016. FINDINGS: Multiple monitoring leads overlie the patient. Stable cardiac and mediastinal contours with extensive bilateral hilar and mediastinal adenopathy. Significant worsening of consolidation within the right lung. Persistent consolidation within the left mid  lung with associated small nodular opacities. Possible right pleural effusion. No pneumothorax. Regional skeleton is unremarkable. IMPRESSION: Interval worsening of consolidation throughout the majority of the right lung which may represent malignancy, postobstructive pneumonia and/or effusion. Persistent hilar and mediastinal adenopathy. Unchanged scattered opacities and consolidation within the left lung. Electronically Signed   By: Lovey Newcomer M.D.   On: 04/10/2016 19:32   Ct Angio Chest Pe W/cm &/or Wo Cm  04/10/2016  CLINICAL DATA:  Acute onset of worsening shortness of breath. Bilateral leg swelling. Recently diagnosed with Hodgkin's lymphoma. Initial encounter. EXAM: CT ANGIOGRAPHY CHEST WITH CONTRAST TECHNIQUE: Multidetector  CT imaging of the chest was performed using the standard protocol during bolus administration of intravenous contrast. Multiplanar CT image reconstructions and MIPs were obtained to evaluate the vascular anatomy. CONTRAST:  100 mL of Isovue 370 IV contrast COMPARISON:  CT of the chest performed 03/16/2016, and chest radiograph performed 04/10/2016 FINDINGS: There is no evidence of pulmonary embolus. There is near complete dense consolidation of the right lung, and central consolidation involving the left upper lobe, with additional scattered nodular opacities throughout the left lung. This is significantly worsened from the prior CT, reflecting worsening bilateral pneumonia. On correlation with right axillary lymph node biopsy results, the patient was recently diagnosed with classic Hodgkin's lymphoma. Hodgkin's lymphoma typically would not narrow the airway to the extent required for postobstructive pneumonia, and is likely unrelated to the patient's presentation with diffuse bilateral pneumonia. There is no evidence of pleural effusion or pneumothorax. Extensive enlarged mediastinal nodes are seen, particularly anteriorly, measuring up to 1.8 cm in short axis. Right axillary nodes measure up to 2.4 cm in short axis. These are compatible with the patient's Hodgkin's lymphoma. A small 3.8 cm collection of fluid at the right axilla likely reflects a postoperative seroma, status post recent right hilar lymph node biopsy. No pericardial effusion is identified. The great vessels are grossly unremarkable in appearance. The visualized portions of the thyroid gland are unremarkable in appearance. The visualized portions of the liver and spleen are unremarkable. No acute osseous abnormalities are seen. Review of the MIP images confirms the above findings. IMPRESSION: 1. No evidence of pulmonary embolus. 2. Near complete dense consolidation of the right lung, and central consolidation involving the left upper lobe, with  scattered nodular opacities throughout the left lung. This is significantly worsened from the prior CT, reflecting worsening bilateral pneumonia. 3. The patient was recently diagnosed with classic Hodgkin's lymphoma for right axillary lymph node biopsy results. The Hodgkin's lymphoma is likely unrelated to the patient's diffuse bilateral pneumonia, as lymph nodes typically do not obstruct the tracheobronchial tree. Enlarged mediastinal and right axillary nodes reflect the patient's lymphoma. 4. 3.8 cm collection of fluid at the right axilla likely reflects a postoperative seroma, status post recent right hilar lymph node biopsy. These results were called by telephone at the time of interpretation on 04/10/2016 at 9:19 pm to [facility] PA, who verbally acknowledged these results. Electronically Signed   By: Garald Balding M.D.   On: 04/10/2016 21:26   Dg Chest Port 1 View  04/19/2016  CLINICAL DATA:  Unsuccessful thoracentesis EXAM: PORTABLE CHEST 1 VIEW COMPARISON:  04/17/2016 FINDINGS: Endotracheal and NG tubes have been removed. Left jugular central venous catheter is stable with its tip at the upper SVC. Extensive bilateral consolidation of right groin left has improved. There is no evidence of pneumothorax. IMPRESSION: No evidence of pneumothorax Bilateral airspace disease right greater than left has improved Extubated. Electronically Signed  By: Marybelle Killings M.D.   On: 04/19/2016 14:36   Dg Chest Port 1 View  04/17/2016  CLINICAL DATA:  Respiratory failure. Hx asthma, Hodgkin lymphoma, htn EXAM: PORTABLE CHEST 1 VIEW COMPARISON:  04/16/2016 FINDINGS: Endotracheal tube is in place, tip 5.1 cm above the carina. Nasogastric tube is in place with tip off the film beyond the gastroesophageal junction. Left IJ central line tip overlies the level of the superior vena cava. There are dense opacities in the lungs bilaterally, right greater than left. Bilateral pleural effusions are present. IMPRESSION: 1.  No significant change in the appearance of lines and tubes. 2. Stable appearance of significant bilateral infiltrates, right greater than left. Electronically Signed   By: Nolon Nations M.D.   On: 04/17/2016 07:38   Dg Chest Port 1 View  04/16/2016  CLINICAL DATA:  Respiratory failure healthcare associated pneumonia intubated patient EXAM: PORTABLE CHEST 1 VIEW COMPARISON:  Portable chest x-ray of April 15, 2016 FINDINGS: The right hemi thorax remains largely obscured with only a small amount of aerated upper lobe visible. The left lung is better aerated but increased density has developed in the retrocardiac region and there is further obscuration of the left hemidiaphragm. A small portion of the left heart border is visible. The pulmonary vascularity is indistinct. The endotracheal tube tip lies 6.4 cm above the carina. The esophagogastric tube tip projects below the inferior margin of the image. The left internal jugular venous catheter tip projects over the proximal SVC. IMPRESSION: Near total opacification of the right hemi thorax consistent with pneumonia and pleural effusion. Increasing opacity on the left consistent with progressive pneumonia. No significant left-sided pleural effusion is visible but is likely obscured. Electronically Signed   By: David  Martinique M.D.   On: 04/16/2016 07:05   Dg Chest Port 1 View  04/15/2016  CLINICAL DATA:  Respiratory failure, evaluate endotracheal tube position EXAM: PORTABLE CHEST 1 VIEW COMPARISON:  04/14/2016 FINDINGS: Endotracheal tube unchanged with tip 5.5 cm above the carina. Orogastric and left IJ support devices stable. Extensive opacification over the right lung stable. Patchy opacification over the left lung stable. IMPRESSION: No change from prior study. Electronically Signed   By: Skipper Cliche M.D.   On: 04/15/2016 07:00   Dg Chest Port 1 View  04/14/2016  CLINICAL DATA:  30 year old female with history of right lower lobe biopsy. EXAM: PORTABLE  CHEST 1 VIEW COMPARISON:  Chest x-ray 04/14/2016. FINDINGS: An endotracheal tube is in place with tip 5.8 cm above the carina. There is a left-sided internal jugular central venous catheter with tip terminating in the proximal superior vena cava. A nasogastric tube is seen extending into the stomach, however, the tip of the nasogastric tube extends below the lower margin of the image. Again noted is extensive airspace consolidation throughout the lungs bilaterally (right greater than left), with some improving aeration in the right upper lobe compared to the prior examination. Small left pleural effusion. Possible right pleural effusion, difficult to judge given the extent of right-sided airspace consolidation on this single view examination. No evidence of pulmonary edema. Heart size is upper limits of normal. Upper mediastinal contours are within normal limits. IMPRESSION: 1. Slight improvement in aeration in the right upper lobe. Otherwise, the appearance the chest is similar, again most compatible with multilobar pneumonia. 2. Support apparatus, as above. Electronically Signed   By: Vinnie Langton M.D.   On: 04/14/2016 13:48   Dg Chest Port 1 View  04/14/2016  CLINICAL DATA:  Endotracheal tube position, Hodgkin lymphoma. EXAM: PORTABLE CHEST 1 VIEW COMPARISON:  04/13/2016 and CT chest 04/10/2016. FINDINGS: Endotracheal tube terminates approximately 6.5 cm above carina, stable. Nasogastric tube is followed into the stomach. Left IJ central line tip projects at the junction of the brachiocephalic veins. Near complete opacification of the right hemithorax with leftward shift of the heart and mediastinum, as before. Patchy airspace consolidation bilaterally. Probable small left pleural effusion. IMPRESSION: 1. Dense consolidation throughout the right lung with patchy areas of consolidation in the left lung, likely due to pneumonia. 2. Possible small left pleural effusion. Electronically Signed   By: Lorin Picket M.D.   On: 04/14/2016 07:07   Dg Chest Port 1 View  04/13/2016  CLINICAL DATA:  Status post central catheter placement. Hodgkin's lymphoma. EXAM: PORTABLE CHEST 1 VIEW COMPARISON:  Study obtained earlier in the day as well as chest CT April 10, 2016 FINDINGS: Central catheter tip is in the superior vena cava near the junction with the left innominate vein. Endotracheal tube tip is 4.4 cm above the carina. Nasogastric tube tip and side port are below the diaphragm. No pneumothorax. There is extensive consolidation with probable superimposed effusion, causing opacification of most of the right hemithorax, stable. There are areas of consolidation in the left mid lung with slightly less overall opacity compared to earlier in the day. The heart size and pulmonary vascularity are normal. It is difficult to assess for adenopathy given the overlying areas of consolidation. No bone lesions are evident. IMPRESSION: Tube and catheter positions as described without pneumothorax. Widespread pneumonia stable on the right with slightly less overall opacity on the left compared to earlier in the day. Confluent opacity is present in the left mid lung focally. Stable cardiac silhouette. Electronically Signed   By: Lowella Grip III M.D.   On: 04/13/2016 11:20   Dg Chest Port 1 View  04/13/2016  CLINICAL DATA:  Endotracheal orogastric tube placement EXAM: PORTABLE CHEST 1 VIEW COMPARISON:  Two days ago FINDINGS: Endotracheal tube tip just below the clavicular heads. An orogastric tube reaches the stomach. Bilateral pneumonia as demonstrated on recent chest CT, nearly confluent throughout the right lung. Air bronchograms are intermittently seen. No evidence of air leak or cavitation. No visible significant pleural effusion. Normal heart size. IMPRESSION: 1. Unremarkable positioning new endotracheal or orogastric tubes. 2.  Bilateral pneumonia without progression since 2 days ago. Electronically Signed   By: [name] Fantasia M.D.   On: 04/13/2016 08:23   Dg C-arm Bronchoscopy  04/14/2016  CLINICAL DATA:  C-ARM BRONCHOSCOPY Fluoroscopy was utilized by the requesting physician.  No radiographic interpretation.    ASSESSMENT & PLAN:   30 year old African-American female with  #1 Mixed cellularity Hodgkin's lymphoma IVB with extensive lymphadenopathy including right axillary, mediastinal and upper retroperitoneal and now biopsy proven pulmonary involvement. She was noted to have significant constitutional symptoms including significant weight loss, fevers chills and some night sweats. HIV negative Hepatitis C and hepatitis B serologies negative. Echo with normal ejection fraction.  #2 hypoxic respiratory failure with dense right lung consolidation and left upper lobe consolidation. Pulmonary biopsy +ve for CHL  Cannot rule out superadded pneumonia. Appears to have some chronic hypercapnia. BAL cx no growth thus far. procalcitonin not very impressive to suggest overt sepsis. Patient notes breathing much improved.   #3 ABdominal distension - improved. Has retroperitoneal and mesenteric adenopathy from her Hodgkin's lymphoma.   #4 significant fluid retention- diuresed while in the hospital.  #5 leukocytosis likely related  to her Neupogen. Resolving . Appears to prevent significant neutropenia.  Plan -improving breathing and cough. -Has completed her course of antibiotics for possible healthcare acquired pneumonia /postobstructive pneumonia . -Ongoing nutritional optimization . --No prohibitive toxicity from chemotherapy at this time. No nausea.  --Patient is likely being discharged today to home. Oxygen requirements on discharge and other details per hospital medicine team.  Follow-up with me in clinic on 04/30/2016 with labs and to receive part B cycle 1 ABVD with Neulasta support.   Appreciate the excellent cares by the critical care and hospital medicine    Sullivan Lone MD [place] AAHIVMS [facility]  [facility] Hematology/Oncology Physician [facility]  (Office):       [phone] (Work cell):  [phone] (Fax):           [phone]

## 2016-04-26 NOTE — Progress Notes (Signed)
SATURATION QUALIFICATIONS: (This note is used to comply with regulatory documentation for home oxygen)  Patient Saturations on Room Air at Rest 84 %   

## 2016-04-27 ENCOUNTER — Other Ambulatory Visit: Payer: Self-pay

## 2016-04-29 ENCOUNTER — Other Ambulatory Visit: Payer: Self-pay | Admitting: *Deleted

## 2016-04-29 DIAGNOSIS — C8178 Other classical Hodgkin lymphoma, lymph nodes of multiple sites: Secondary | ICD-10-CM

## 2016-04-29 LAB — FUNGUS CULTURE WITH STAIN

## 2016-04-29 LAB — FUNGUS CULTURE RESULT

## 2016-04-29 LAB — FUNGAL ORGANISM REFLEX

## 2016-04-30 ENCOUNTER — Encounter: Payer: Self-pay | Admitting: *Deleted

## 2016-04-30 ENCOUNTER — Other Ambulatory Visit: Payer: Self-pay

## 2016-04-30 ENCOUNTER — Ambulatory Visit: Payer: Self-pay | Admitting: Hematology

## 2016-04-30 ENCOUNTER — Other Ambulatory Visit: Payer: Self-pay | Admitting: *Deleted

## 2016-04-30 ENCOUNTER — Ambulatory Visit: Payer: Self-pay

## 2016-04-30 NOTE — Progress Notes (Signed)
Informed patient of new schedule.Patient verbalized understanding.

## 2016-05-01 ENCOUNTER — Ambulatory Visit: Payer: Self-pay

## 2016-05-03 ENCOUNTER — Ambulatory Visit (HOSPITAL_BASED_OUTPATIENT_CLINIC_OR_DEPARTMENT_OTHER): Payer: Self-pay | Admitting: Nurse Practitioner

## 2016-05-03 ENCOUNTER — Ambulatory Visit (HOSPITAL_BASED_OUTPATIENT_CLINIC_OR_DEPARTMENT_OTHER): Payer: Self-pay

## 2016-05-03 ENCOUNTER — Encounter: Payer: Self-pay | Admitting: Hematology

## 2016-05-03 ENCOUNTER — Other Ambulatory Visit (HOSPITAL_BASED_OUTPATIENT_CLINIC_OR_DEPARTMENT_OTHER): Payer: Self-pay

## 2016-05-03 ENCOUNTER — Telehealth: Payer: Self-pay | Admitting: Hematology

## 2016-05-03 ENCOUNTER — Ambulatory Visit (HOSPITAL_BASED_OUTPATIENT_CLINIC_OR_DEPARTMENT_OTHER): Payer: Self-pay | Admitting: Hematology

## 2016-05-03 VITALS — BP 120/73 | HR 98 | Temp 98.4°F | Resp 18 | Ht 60.5 in | Wt 125.3 lb

## 2016-05-03 VITALS — BP 131/78 | HR 97 | Temp 99.3°F | Resp 20

## 2016-05-03 DIAGNOSIS — C8128 Mixed cellularity classical Hodgkin lymphoma, lymph nodes of multiple sites: Secondary | ICD-10-CM

## 2016-05-03 DIAGNOSIS — T7840XA Allergy, unspecified, initial encounter: Secondary | ICD-10-CM

## 2016-05-03 DIAGNOSIS — R140 Abdominal distension (gaseous): Secondary | ICD-10-CM

## 2016-05-03 DIAGNOSIS — C8178 Other classical Hodgkin lymphoma, lymph nodes of multiple sites: Secondary | ICD-10-CM

## 2016-05-03 DIAGNOSIS — J9691 Respiratory failure, unspecified with hypoxia: Secondary | ICD-10-CM

## 2016-05-03 DIAGNOSIS — Z5111 Encounter for antineoplastic chemotherapy: Secondary | ICD-10-CM

## 2016-05-03 LAB — CBC & DIFF AND RETIC
BASO%: 0.3 % (ref 0.0–2.0)
Basophils Absolute: 0 10*3/uL (ref 0.0–0.1)
EOS%: 1.6 % (ref 0.0–7.0)
Eosinophils Absolute: 0.1 10*3/uL (ref 0.0–0.5)
HCT: 37.4 % (ref 34.8–46.6)
HGB: 11.8 g/dL (ref 11.6–15.9)
IMMATURE RETIC FRACT: 12.6 % — AB (ref 1.60–10.00)
LYMPH%: 4.5 % — AB (ref 14.0–49.7)
MCH: 29.3 pg (ref 25.1–34.0)
MCHC: 31.6 g/dL (ref 31.5–36.0)
MCV: 92.8 fL (ref 79.5–101.0)
MONO#: 0.7 10*3/uL (ref 0.1–0.9)
MONO%: 11.2 % (ref 0.0–14.0)
NEUT#: 5.1 10*3/uL (ref 1.5–6.5)
NEUT%: 82.4 % — AB (ref 38.4–76.8)
PLATELETS: 489 10*3/uL — AB (ref 145–400)
RBC: 4.03 10*6/uL (ref 3.70–5.45)
RDW: 15.6 % — ABNORMAL HIGH (ref 11.2–14.5)
Retic %: 3.53 % — ABNORMAL HIGH (ref 0.70–2.10)
Retic Ct Abs: 142.26 10*3/uL — ABNORMAL HIGH (ref 33.70–90.70)
WBC: 6.2 10*3/uL (ref 3.9–10.3)
lymph#: 0.3 10*3/uL — ABNORMAL LOW (ref 0.9–3.3)

## 2016-05-03 LAB — COMPREHENSIVE METABOLIC PANEL
ALT: 16 U/L (ref 0–55)
ANION GAP: 11 meq/L (ref 3–11)
AST: 11 U/L (ref 5–34)
Albumin: 2.9 g/dL — ABNORMAL LOW (ref 3.5–5.0)
Alkaline Phosphatase: 125 U/L (ref 40–150)
BUN: <4 mg/dL — ABNORMAL LOW (ref 7.0–26.0)
CO2: 29 meq/L (ref 22–29)
Calcium: 9.5 mg/dL (ref 8.4–10.4)
Chloride: 102 mEq/L (ref 98–109)
Creatinine: 0.6 mg/dL (ref 0.6–1.1)
EGFR: >90 mL/min/{1.73_m2} (ref >90)
Glucose: 61 mg/dl — ABNORMAL LOW (ref 70–140)
POTASSIUM: 3.7 meq/L (ref 3.5–5.1)
Sodium: 143 mEq/L (ref 136–145)
TOTAL PROTEIN: 6.8 g/dL (ref 6.4–8.3)

## 2016-05-03 LAB — ACID FAST CULTURE WITH REFLEXED SENSITIVITIES (MYCOBACTERIA): Acid Fast Culture: NEGATIVE

## 2016-05-03 LAB — ACID FAST CULTURE WITH REFLEXED SENSITIVITIES

## 2016-05-03 MED ORDER — DIPHENHYDRAMINE HCL 50 MG/ML IJ SOLN
25.0000 mg | Freq: Once | INTRAMUSCULAR | Status: AC
  Administered 2016-05-03: 25 mg via INTRAVENOUS

## 2016-05-03 MED ORDER — ONDANSETRON HCL 8 MG PO TABS: 8.0000 mg | ORAL_TABLET | Freq: Three times a day (TID) | ORAL | Status: DC | PRN

## 2016-05-03 MED ORDER — PALONOSETRON HCL INJECTION 0.25 MG/5ML
INTRAVENOUS | Status: AC
  Filled 2016-05-03: qty 5

## 2016-05-03 MED ORDER — PROCHLORPERAZINE MALEATE 10 MG PO TABS: 10.0000 mg | ORAL_TABLET | Freq: Four times a day (QID) | ORAL | Status: DC | PRN

## 2016-05-03 MED ORDER — SODIUM CHLORIDE 0.9 % IV SOLN
600.0000 mg | Freq: Once | INTRAVENOUS | Status: AC
  Administered 2016-05-03: 600 mg via INTRAVENOUS
  Filled 2016-05-03: qty 30

## 2016-05-03 MED ORDER — SODIUM CHLORIDE 0.9 % IV SOLN
Freq: Once | INTRAVENOUS | Status: AC
  Administered 2016-05-03: 15:00:00 via INTRAVENOUS

## 2016-05-03 MED ORDER — VINBLASTINE SULFATE CHEMO INJECTION 1 MG/ML
9.0000 mg | Freq: Once | INTRAVENOUS | Status: AC
  Administered 2016-05-03: 9 mg via INTRAVENOUS
  Filled 2016-05-03: qty 9

## 2016-05-03 MED ORDER — PALONOSETRON HCL INJECTION 0.25 MG/5ML
0.2500 mg | Freq: Once | INTRAVENOUS | Status: AC
  Administered 2016-05-03: 0.25 mg via INTRAVENOUS

## 2016-05-03 MED ORDER — DOXORUBICIN HCL CHEMO IV INJECTION 2 MG/ML
40.0000 mg | Freq: Once | INTRAVENOUS | Status: AC
  Administered 2016-05-03: 40 mg via INTRAVENOUS
  Filled 2016-05-03: qty 20

## 2016-05-03 MED ORDER — SODIUM CHLORIDE 0.9 % IV SOLN
Freq: Once | INTRAVENOUS | Status: AC
  Administered 2016-05-03: 16:00:00 via INTRAVENOUS
  Filled 2016-05-03: qty 5

## 2016-05-03 MED ORDER — FAMOTIDINE IN NACL 20-0.9 MG/50ML-% IV SOLN
20.0000 mg | Freq: Once | INTRAVENOUS | Status: AC
  Administered 2016-05-03: 20 mg via INTRAVENOUS

## 2016-05-03 MED ORDER — DEXAMETHASONE 4 MG PO TABS: ORAL_TABLET | ORAL | Status: DC

## 2016-05-03 NOTE — Telephone Encounter (Signed)
PT WILL P/U UPDATED SCHED IN [place]

## 2016-05-03 NOTE — Progress Notes (Signed)
Heidi [name]  HEMATOLOGY ONCOLOGY PROGRESS NOTE  Date of service: .05/03/2016  No care team member to display  Diagnosis: Mixed cellularity Hodgkin's lymphoma IVB with extensive lymphadenopathy including right axillary, mediastinal and upper retroperitoneal and now biopsy proven pulmonary involvement. She was noted to have significant constitutional symptoms including significant weight loss, fevers chills and some night sweats.  Current Treatment:   Started with ABVD without bleomycin on 04/16/2016  INTERVAL HISTORY:  Heidi [name] is here for follow-up of her Hodgkin's lymphoma prior to her part be C1 D15 chemotherapy. (ABVD without Bleomycin). She missed her appointment on Friday and rescheduled for today. She notes her breathing is much improved. Cough is now resolved. Currently has been functioning well without oxygen. No fevers/chills/night sweats. No skin rashes. No other issues with nausea or vomiting. No other acute new symptoms.  REVIEW OF SYSTEMS:    10 Point review of systems of done and is negative except as noted above.  . Past Medical History  Diagnosis Date  . Asthma   . Hodgkin lymphoma ([place])   . Hypertension     . Past Surgical History  Procedure Laterality Date  . Axillary lymph node biopsy Right 03/19/2016    Procedure: AXILLARY LYMPH NODE BIOPSY;  Surgeon: Armandina Gemma, MD;  Location: [facility];  Service: General;  Laterality: Right;    . Social History  Substance Use Topics  . Smoking status: Former Smoker -- 0.50 packs/day for 15 years    Types: Cigarettes    Quit date: 03/27/2016  . Smokeless tobacco: Never Used  . Alcohol Use: Yes     Comment: occasional now    ALLERGIES:  has No Known Allergies.  MEDICATIONS:  Current Outpatient Prescriptions  Medication Sig Dispense Refill  . guaiFENesin (MUCINEX) 600 MG 12 hr tablet Take 1,200 mg by mouth 2 (two) times daily as needed for cough or to loosen phlegm.    . levalbuterol (XOPENEX HFA) 45 MCG/ACT inhaler  Inhale 2 puffs into the lungs every 6 (six) hours as needed for wheezing. 1 Inhaler 12  . metoprolol (LOPRESSOR) 50 MG tablet Take 1 tablet (50 mg total) by mouth 2 (two) times daily. 60 tablet 0  . dexamethasone (DECADRON) 4 MG tablet 2 tab (8mg ) twice a day for 3 days starting on day of chemotherapy 30 tablet 3  . labetalol (NORMODYNE) 100 MG tablet TK 1 T PO  BID  6  . ondansetron (ZOFRAN) 8 MG tablet Take 1 tablet (8 mg total) by mouth every 8 (eight) hours as needed for nausea (start on the 3rd day after chemotherapy). 30 tablet 3  . prochlorperazine (COMPAZINE) 10 MG tablet Take 1 tablet (10 mg total) by mouth every 6 (six) hours as needed for nausea or vomiting. 30 tablet 3   No current facility-administered medications for this visit.    PHYSICAL EXAMINATION: ECOG PERFORMANCE STATUS: 1 - Symptomatic but completely ambulatory  . Filed Vitals:   05/03/16 1302  BP: 120/73  Pulse: 98  Temp: 98.4 F (36.9 C)  Resp: 18    Filed Weights   05/03/16 1302  Weight: 125 lb 4.8 oz (56.836 kg)   .Body mass index is 24.06 kg/(m^2).  OROPHARYNX: Moist mucous membranes  NECK: supple, no JVD LYMPH: Small cervical and axillary lymphadenopathy  LUNGS: Bilateral coarse breath sounds with scattered bilateral wheezes, improved air entry. HEART: regular rate & rhythm ABDOMEN: abdomen soft, nontender, normoactive bowel sounds Extremities no edema  NEURO: Awake alert and oriented 3, moving all 4 extremities.  No overt focal neurological deficit.  LABORATORY DATA:   I have reviewed the data as listed  . CBC Latest Ref Rng 05/03/2016 04/26/2016 04/25/2016  WBC 3.9 - 10.3 10e3/uL 6.2 13.2(H) 17.3(H)  Hemoglobin 11.6 - 15.9 g/dL 11.8 9.6(L) 9.4(L)  Hematocrit 34.8 - 46.6 % 37.4 29.9(L) 29.6(L)  Platelets 145 - 400 10e3/uL 489(H) 421(H) 485(H)    . CMP Latest Ref Rng 05/03/2016 04/26/2016 04/25/2016  Glucose 70 - 140 mg/dl 61(L) 138(H) 150(H)  BUN 7.0 - 26.0 mg/dL <4.0(L) 7 6  Creatinine 0.6  - 1.1 mg/dL 0.6 0.42(L) 0.46  Sodium 136 - 145 mEq/L 143 139 139  Potassium 3.5 - 5.1 mEq/L 3.7 4.3 4.1  Chloride 101 - 111 mmol/L - 95(L) 96(L)  CO2 22 - 29 mEq/L 29 33(H) 33(H)  Calcium 8.4 - 10.4 mg/dL 9.5 8.5(L) 8.4(L)  Total Protein 6.4 - 8.3 g/dL 6.8 - -  Total Bilirubin 0.20 - 1.20 mg/dL <0.30 - -  Alkaline Phos 40 - 150 U/L 125 - -  AST 5 - 34 U/L 11 - -  ALT 0 - 55 U/L 16 - -     RADIOGRAPHIC STUDIES: I have personally reviewed the radiological images as listed and agreed with the findings in the report. Ct Abdomen Pelvis Wo Contrast  04/16/2016  CLINICAL DATA:  Lymphoma, respiratory failure, anasarca. EXAM: CT ABDOMEN AND PELVIS WITHOUT CONTRAST TECHNIQUE: Multidetector CT imaging of the abdomen and pelvis was performed following the standard protocol without IV contrast. COMPARISON:  Chest CT 04/10/2016. FINDINGS: Lower chest: Persistent bilateral pleural effusions, bilateral infiltrates and pulmonary nodules. Hepatobiliary: No obvious hepatic lesions but study is limited without IV contrast. The gallbladder is grossly normal. Pancreas: No obvious mass, inflammation or ductal dilatation. Spleen: Normal size.  No obvious lesions. Adrenals/Urinary Tract: The adrenal glands and kidneys are grossly normal. Stomach/Bowel: There is an NG tube in the stomach. The duodenum, small bowel and colon are grossly normal. Vascular/Lymphatic: Retroperitoneal lymphadenopathy. 2 cm retroperitoneal lymph node on image number 42. The aorta is normal in caliber. No atherosclerotic calcifications. Reproductive: The uterus and ovaries are grossly normal. Other: The bladder contains a Foley catheter. There is a moderate amount of free pelvic fluid. No inguinal adenopathy. Diffuse body wall edema suggesting anasarca. Musculoskeletal: No significant bony findings. IMPRESSION: 1. Limited examination due to lack of IV contrast, artifact from the right arm and motion. 2. Persistent effusions, infiltrates and  pulmonary nodules. 3. Diffuse body wall edema suggesting anasarca. 4. Mesenteric and retroperitoneal lymphadenopathy. 5. Moderate free pelvic fluid. Electronically Signed   By: Marijo Sanes M.D.   On: 04/16/2016 11:37   Dg Chest 1 View  04/11/2016  CLINICAL DATA:  Shortness of breath.  History of lymphoma. EXAM: CHEST 1 VIEW COMPARISON:  Chest radiograph 04/10/2016; chest CT 04/10/2016 FINDINGS: Grossly unchanged diffuse opacification of the right hemi thorax with worsening opacities involving the left mid and upper lung. No definite pleural effusion or pneumothorax. Regional skeleton is unremarkable. IMPRESSION: Interval worsening opacities left upper lung with persistent near complete opacification of the right hemi thorax, findings overall compatible with extensive multi focal pneumonia. Known mediastinal and hilar adenopathy. Electronically Signed   By: Lovey Newcomer M.D.   On: 04/11/2016 17:03   Dg Chest 2 View  04/21/2016  CLINICAL DATA:  Shortness of breath, history of lymphoma EXAM: CHEST  2 VIEW COMPARISON:  04/20/2016 FINDINGS: Cardiomediastinal silhouette is stable. Again noted extensive multifocal nodular consolidation bilaterally right greater than left without change in aeration.  Left IJ central line with tip in upper SVC is unchanged in position. No convincing pulmonary edema. IMPRESSION: Again noted extensive multifocal nodular consolidation bilaterally right greater than left without change in aeration. Left IJ central line with tip in upper SVC is unchanged in position. No convincing pulmonary edema. Electronically Signed   By: Lahoma Crocker M.D.   On: 04/21/2016 11:04   Dg Chest 2 View  04/20/2016  CLINICAL DATA:  Shortness of breath, cough, dizziness.  Lymphoma. EXAM: CHEST  2 VIEW COMPARISON:  04/19/2016 FINDINGS: Dense consolidation throughout much of the right lung. Patchy nodular opacities in the mid and lower lung. Mild cardiomegaly. No visible effusions. No acute bony abnormality. Left  central line tip in the SVC, unchanged. IMPRESSION: Extensive bilateral nodular and masslike airspace disease, right much greater than left. No real change since prior study. This presumably represents multifocal pneumonia Electronically Signed   By: Rolm Baptise M.D.   On: 04/20/2016 09:49   Dg Chest 2 View  04/10/2016  CLINICAL DATA:  Patient with shortness of breath for multiple weeks. Recently diagnosed with lymphoma. EXAM: CHEST  2 VIEW COMPARISON:  Chest CT 03/16/2016; chest radiograph 03/22/2016. FINDINGS: Multiple monitoring leads overlie the patient. Stable cardiac and mediastinal contours with extensive bilateral hilar and mediastinal adenopathy. Significant worsening of consolidation within the right lung. Persistent consolidation within the left mid lung with associated small nodular opacities. Possible right pleural effusion. No pneumothorax. Regional skeleton is unremarkable. IMPRESSION: Interval worsening of consolidation throughout the majority of the right lung which may represent malignancy, postobstructive pneumonia and/or effusion. Persistent hilar and mediastinal adenopathy. Unchanged scattered opacities and consolidation within the left lung. Electronically Signed   By: Lovey Newcomer M.D.   On: 04/10/2016 19:32   Ct Angio Chest Pe W/cm &/or Wo Cm  04/10/2016  CLINICAL DATA:  Acute onset of worsening shortness of breath. Bilateral leg swelling. Recently diagnosed with Hodgkin's lymphoma. Initial encounter. EXAM: CT ANGIOGRAPHY CHEST WITH CONTRAST TECHNIQUE: Multidetector CT imaging of the chest was performed using the standard protocol during bolus administration of intravenous contrast. Multiplanar CT image reconstructions and MIPs were obtained to evaluate the vascular anatomy. CONTRAST:  100 mL of Isovue 370 IV contrast COMPARISON:  CT of the chest performed 03/16/2016, and chest radiograph performed 04/10/2016 FINDINGS: There is no evidence of pulmonary embolus. There is near complete  dense consolidation of the right lung, and central consolidation involving the left upper lobe, with additional scattered nodular opacities throughout the left lung. This is significantly worsened from the prior CT, reflecting worsening bilateral pneumonia. On correlation with right axillary lymph node biopsy results, the patient was recently diagnosed with classic Hodgkin's lymphoma. Hodgkin's lymphoma typically would not narrow the airway to the extent required for postobstructive pneumonia, and is likely unrelated to the patient's presentation with diffuse bilateral pneumonia. There is no evidence of pleural effusion or pneumothorax. Extensive enlarged mediastinal nodes are seen, particularly anteriorly, measuring up to 1.8 cm in short axis. Right axillary nodes measure up to 2.4 cm in short axis. These are compatible with the patient's Hodgkin's lymphoma. A small 3.8 cm collection of fluid at the right axilla likely reflects a postoperative seroma, status post recent right hilar lymph node biopsy. No pericardial effusion is identified. The great vessels are grossly unremarkable in appearance. The visualized portions of the thyroid gland are unremarkable in appearance. The visualized portions of the liver and spleen are unremarkable. No acute osseous abnormalities are seen. Review of the MIP images confirms the  above findings. IMPRESSION: 1. No evidence of pulmonary embolus. 2. Near complete dense consolidation of the right lung, and central consolidation involving the left upper lobe, with scattered nodular opacities throughout the left lung. This is significantly worsened from the prior CT, reflecting worsening bilateral pneumonia. 3. The patient was recently diagnosed with classic Hodgkin's lymphoma for right axillary lymph node biopsy results. The Hodgkin's lymphoma is likely unrelated to the patient's diffuse bilateral pneumonia, as lymph nodes typically do not obstruct the tracheobronchial tree. Enlarged  mediastinal and right axillary nodes reflect the patient's lymphoma. 4. 3.8 cm collection of fluid at the right axilla likely reflects a postoperative seroma, status post recent right hilar lymph node biopsy. These results were called by telephone at the time of interpretation on 04/10/2016 at 9:19 pm to [facility] PA, who verbally acknowledged these results. Electronically Signed   By: Garald Balding M.D.   On: 04/10/2016 21:26   Dg Chest Port 1 View  04/19/2016  CLINICAL DATA:  Unsuccessful thoracentesis EXAM: PORTABLE CHEST 1 VIEW COMPARISON:  04/17/2016 FINDINGS: Endotracheal and NG tubes have been removed. Left jugular central venous catheter is stable with its tip at the upper SVC. Extensive bilateral consolidation of right groin left has improved. There is no evidence of pneumothorax. IMPRESSION: No evidence of pneumothorax Bilateral airspace disease right greater than left has improved Extubated. Electronically Signed   By: Marybelle Killings M.D.   On: 04/19/2016 14:36   Dg Chest Port 1 View  04/17/2016  CLINICAL DATA:  Respiratory failure. Hx asthma, Hodgkin lymphoma, htn EXAM: PORTABLE CHEST 1 VIEW COMPARISON:  04/16/2016 FINDINGS: Endotracheal tube is in place, tip 5.1 cm above the carina. Nasogastric tube is in place with tip off the film beyond the gastroesophageal junction. Left IJ central line tip overlies the level of the superior vena cava. There are dense opacities in the lungs bilaterally, right greater than left. Bilateral pleural effusions are present. IMPRESSION: 1. No significant change in the appearance of lines and tubes. 2. Stable appearance of significant bilateral infiltrates, right greater than left. Electronically Signed   By: Nolon Nations M.D.   On: 04/17/2016 07:38   Dg Chest Port 1 View  04/16/2016  CLINICAL DATA:  Respiratory failure healthcare associated pneumonia intubated patient EXAM: PORTABLE CHEST 1 VIEW COMPARISON:  Portable chest x-ray of April 15, 2016  FINDINGS: The right hemi thorax remains largely obscured with only a small amount of aerated upper lobe visible. The left lung is better aerated but increased density has developed in the retrocardiac region and there is further obscuration of the left hemidiaphragm. A small portion of the left heart border is visible. The pulmonary vascularity is indistinct. The endotracheal tube tip lies 6.4 cm above the carina. The esophagogastric tube tip projects below the inferior margin of the image. The left internal jugular venous catheter tip projects over the proximal SVC. IMPRESSION: Near total opacification of the right hemi thorax consistent with pneumonia and pleural effusion. Increasing opacity on the left consistent with progressive pneumonia. No significant left-sided pleural effusion is visible but is likely obscured. Electronically Signed   By: David  Martinique M.D.   On: 04/16/2016 07:05   Dg Chest Port 1 View  04/15/2016  CLINICAL DATA:  Respiratory failure, evaluate endotracheal tube position EXAM: PORTABLE CHEST 1 VIEW COMPARISON:  04/14/2016 FINDINGS: Endotracheal tube unchanged with tip 5.5 cm above the carina. Orogastric and left IJ support devices stable. Extensive opacification over the right lung stable. Patchy opacification over the left  lung stable. IMPRESSION: No change from prior study. Electronically Signed   By: Skipper Cliche M.D.   On: 04/15/2016 07:00   Dg Chest Port 1 View  04/14/2016  CLINICAL DATA:  30 year old female with history of right lower lobe biopsy. EXAM: PORTABLE CHEST 1 VIEW COMPARISON:  Chest x-ray 04/14/2016. FINDINGS: An endotracheal tube is in place with tip 5.8 cm above the carina. There is a left-sided internal jugular central venous catheter with tip terminating in the proximal superior vena cava. A nasogastric tube is seen extending into the stomach, however, the tip of the nasogastric tube extends below the lower margin of the image. Again noted is extensive airspace  consolidation throughout the lungs bilaterally (right greater than left), with some improving aeration in the right upper lobe compared to the prior examination. Small left pleural effusion. Possible right pleural effusion, difficult to judge given the extent of right-sided airspace consolidation on this single view examination. No evidence of pulmonary edema. Heart size is upper limits of normal. Upper mediastinal contours are within normal limits. IMPRESSION: 1. Slight improvement in aeration in the right upper lobe. Otherwise, the appearance the chest is similar, again most compatible with multilobar pneumonia. 2. Support apparatus, as above. Electronically Signed   By: Vinnie Langton M.D.   On: 04/14/2016 13:48   Dg Chest Port 1 View  04/14/2016  CLINICAL DATA:  Endotracheal tube position, Hodgkin lymphoma. EXAM: PORTABLE CHEST 1 VIEW COMPARISON:  04/13/2016 and CT chest 04/10/2016. FINDINGS: Endotracheal tube terminates approximately 6.5 cm above carina, stable. Nasogastric tube is followed into the stomach. Left IJ central line tip projects at the junction of the brachiocephalic veins. Near complete opacification of the right hemithorax with leftward shift of the heart and mediastinum, as before. Patchy airspace consolidation bilaterally. Probable small left pleural effusion. IMPRESSION: 1. Dense consolidation throughout the right lung with patchy areas of consolidation in the left lung, likely due to pneumonia. 2. Possible small left pleural effusion. Electronically Signed   By: Lorin Picket M.D.   On: 04/14/2016 07:07   Dg Chest Port 1 View  04/13/2016  CLINICAL DATA:  Status post central catheter placement. Hodgkin's lymphoma. EXAM: PORTABLE CHEST 1 VIEW COMPARISON:  Study obtained earlier in the day as well as chest CT April 10, 2016 FINDINGS: Central catheter tip is in the superior vena cava near the junction with the left innominate vein. Endotracheal tube tip is 4.4 cm above the carina.  Nasogastric tube tip and side port are below the diaphragm. No pneumothorax. There is extensive consolidation with probable superimposed effusion, causing opacification of most of the right hemithorax, stable. There are areas of consolidation in the left mid lung with slightly less overall opacity compared to earlier in the day. The heart size and pulmonary vascularity are normal. It is difficult to assess for adenopathy given the overlying areas of consolidation. No bone lesions are evident. IMPRESSION: Tube and catheter positions as described without pneumothorax. Widespread pneumonia stable on the right with slightly less overall opacity on the left compared to earlier in the day. Confluent opacity is present in the left mid lung focally. Stable cardiac silhouette. Electronically Signed   By: Lowella Grip III M.D.   On: 04/13/2016 11:20   Dg Chest Port 1 View  04/13/2016  CLINICAL DATA:  Endotracheal orogastric tube placement EXAM: PORTABLE CHEST 1 VIEW COMPARISON:  Two days ago FINDINGS: Endotracheal tube tip just below the clavicular heads. An orogastric tube reaches the stomach. Bilateral pneumonia as demonstrated on  recent chest CT, nearly confluent throughout the right lung. Air bronchograms are intermittently seen. No evidence of air leak or cavitation. No visible significant pleural effusion. Normal heart size. IMPRESSION: 1. Unremarkable positioning new endotracheal or orogastric tubes. 2.  Bilateral pneumonia without progression since 2 days ago. Electronically Signed   By: Monte Fantasia M.D.   On: 04/13/2016 08:23   Dg C-arm Bronchoscopy  04/14/2016  CLINICAL DATA:  C-ARM BRONCHOSCOPY Fluoroscopy was utilized by the requesting physician.  No radiographic interpretation.    ASSESSMENT & PLAN:   30 year old African-American female with  #1 Mixed cellularity Hodgkin's lymphoma IV B E with extensive lymphadenopathy including right axillary, mediastinal and upper retroperitoneal and now  biopsy proven pulmonary involvement. She was noted to have significant constitutional symptoms including significant weight loss, fevers chills and some night sweats. HIV negative Hepatitis C and hepatitis B serologies negative. Echo with normal ejection fraction. Patient is status post part a of the first cycle of ABVD without bleomycin due to significant lung involvement and ongoing O2 requirement.  #2 hypoxic respiratory failure with dense right lung consolidation and left upper lobe consolidation. Pulmonary biopsy +ve for CHL - improved. Currently off oxygen. Appears to have some chronic hypercapnia. Patient notes breathing much improved.   #3 ABdominal distension -much  improved. Has retroperitoneal and mesenteric adenopathy from her Hodgkin's lymphoma. Plan -improving breathing and cough. -Labs currently stable. -Patient appropriate to proceed with C1 D15 ABVD ( without bleomycin) with Neulasta support given 2 recent hospitalizations for possible pneumonia. -Will get lung function testing prior to next cycle to check for resolution of pulmonary involvement to determine if we can add bleomycin to her treatment.  -Initial PET/CT scan for staging of her newly diagnosed Hodgkin's lymphoma. Could not be done as outpatient. -Given medications for nausea management chemotherapy. -Given referrals for chemotherapy counseling for additional information since she was intubated and on a ventilator when we had to start her treatment in the hospital. Informed consent and counseling was provided to her mother at the time. [facility] placement referral. -Given prescriptions for antinausea medications postchemotherapy. -Patient reports she is not aware of her Medicaid status and wonders if it is pending. Financial counselor information was provided to her. They were going to meet her during her chemotherapy today.  Return to care with Dr. Irene Limbo in 2 weeks with CBC CMP .  I spent 40 minutes counseling the  patient face to face. The total time spent in the appointment was 45 minutes and more than 50% was on counseling and direct patient cares.    Sullivan Lone MD [place] AAHIVMS [facility] Hematology/Oncology Physician [facility]  (Office):       [phone] (Work cell):  [phone] (Fax):           [phone]

## 2016-05-03 NOTE — Patient Instructions (Signed)
[place] Discharge Instructions for Patients Receiving Chemotherapy  Today you received the following chemotherapy agents Adriamycin/Vincristine/Dacarbazine.  To help prevent nausea and vomiting after your treatment, we encourage you to take your nausea medication as directed.    If you develop nausea and vomiting that is not controlled by your nausea medication, call the clinic.   BELOW ARE SYMPTOMS THAT SHOULD BE REPORTED IMMEDIATELY:  *FEVER GREATER THAN 100.5 F  *CHILLS WITH OR WITHOUT FEVER  NAUSEA AND VOMITING THAT IS NOT CONTROLLED WITH YOUR NAUSEA MEDICATION  *UNUSUAL SHORTNESS OF BREATH  *UNUSUAL BRUISING OR BLEEDING  TENDERNESS IN MOUTH AND THROAT WITH OR WITHOUT PRESENCE OF ULCERS  *URINARY PROBLEMS  *BOWEL PROBLEMS  UNUSUAL RASH Items with * indicate a potential emergency and should be followed up as soon as possible.  Feel free to call the clinic you have any questions or concerns. The clinic phone number is [phone].  Please show the [place] at check-in to the Emergency Department and triage nurse.

## 2016-05-03 NOTE — Progress Notes (Signed)
Introduced myself as her FC.  Pt has been working w/ Holli Humbles [facility] on the hospital side).  She has completed a Medicaid app which is pending.  Per Wilhemena Durie, pt has completed a FCA as well but that app cannot be submitted until a determination has been made on her Medicaid application.  Pt had concerns about paying for her prescriptions so I offered the [place], gave her an expense sheet & discussed how it works.  Pt is not working so her mother will provide a letter of support on 05/05/16.  I also emailed Raquel to inquire about drug replacement.  She has mine and Raquel's card for any questions or concerns she may have.

## 2016-05-03 NOTE — Progress Notes (Signed)
1614: At the end of the adriamycin push, patient reported that she was itching all over, mainly on her face and hands. 1615: Selena Lesser at bedside. See MAR. Gave benadryl, pepcid, and IVF. Filed Vitals:   05/03/16 1618  BP: 125/73  Pulse: 116  Resp: 20  93% O2 on room air  1625: O2 sat dropped to 86%. Applied 2L of oxygen via nasal cannula. 1637: Patient reports that she still itches, but it is getting a little better. 1645: Patient vitals are stable and will start the next part of her infusion (Vinblastine) per [employer]. Gave report to [place], Therapist, sports.  Ma Hillock, RN

## 2016-05-04 ENCOUNTER — Encounter: Payer: Self-pay | Admitting: Nurse Practitioner

## 2016-05-04 DIAGNOSIS — T7840XA Allergy, unspecified, initial encounter: Secondary | ICD-10-CM | POA: Insufficient documentation

## 2016-05-04 NOTE — Progress Notes (Signed)
SYMPTOM MANAGEMENT CLINIC    Chief Complaint: Hypersensitivity reaction  HPI:  Heidi [name] 30 y.o. female diagnosed with Hodgkin's lymphoma.  Currently undergoing ABVD the chemotherapy regimen. Patient presented to the [facility] today.  Receive cycle 1, day 15 of her AB VD chemotherapy regimen.  She developed a very mild hypersensitivity reaction with the Adriamycin push; which consisted of some mild, generalized itching.  Reaction was managed with Benadryl and Pepcid; and patient was able to complete all of her chemotherapy today without further issues.  Patient is scheduled to return for her Neulasta injection for growth factor support on 05/05/2016.  She is scheduled for PET scan on 05/12/2016.  She is scheduled for labs, flush, visit, and her next cycle of chemotherapy on 05/14/2016.    No history exists.    ROS  Past Medical History  Diagnosis Date  . Asthma   . Hodgkin lymphoma ([place])   . Hypertension     Past Surgical History  Procedure Laterality Date  . 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    has No Known Allergies.    Medication List       This list is accurate as of:  05/03/16 11:59 PM.  Always use your most recent med list.               dexamethasone 4 MG tablet  Commonly known as:  DECADRON  2 tab (73m) twice a day for 3 days starting on day of chemotherapy     guaiFENesin 600 MG 12 hr tablet  Commonly known as:  MUCINEX  Take 1,200 mg by mouth 2 (two) times daily as needed for cough or to loosen phlegm.     labetalol 100 MG tablet  Commonly known as:  NORMODYNE  TK 1 T PO  BID     levalbuterol 45 MCG/ACT inhaler  Commonly known as:  XOPENEX HFA  Inhale 2 puffs into the lungs every 6 (six) hours as needed for wheezing.     metoprolol 50 MG tablet  Commonly known as:  LOPRESSOR  Take 1 tablet (50 mg total) by mouth 2 (two) times daily.     ondansetron 8 MG tablet  Commonly known as:  ZOFRAN  Take 1 tablet (8 mg total) by mouth every 8 (eight) hours as needed for nausea (start on the 3rd day after chemotherapy).     prochlorperazine 10 MG tablet  Commonly known as:  COMPAZINE  Take 1 tablet (10 mg total) by mouth every 6 (six) hours as needed for nausea or vomiting.         PHYSICAL EXAMINATION  Oncology Vitals 05/03/2016 05/03/2016  Height - -  Weight - -  Weight (lbs) - -  BMI (kg/m2) - -  Temp 99.3 -  Pulse - 97  Resp - 20  SpO2 - 100  BSA (m2) - -   BP  Readings from Last 2 Encounters:  05/03/16 131/78  05/03/16 120/73    Physical Exam  Constitutional: She is oriented to person, place, and time and well-developed, well-nourished, and in no distress.  HENT:  Head: Normocephalic and atraumatic.  Eyes: Conjunctivae and EOM are normal. Pupils are equal, round, and reactive to light. Right eye exhibits no discharge. Left eye exhibits no discharge. No scleral icterus.  Neck: Normal range of motion.  Pulmonary/Chest: Effort normal. No stridor. No respiratory distress.  Musculoskeletal: Normal range of motion.  Neurological: She is alert and oriented to person, place, and time. Gait normal.  Skin: Skin is warm and dry. No rash  noted. No erythema.  Patient was itching to her generalized body; no rash noted.  Psychiatric: Affect normal.  Nursing note and vitals reviewed.   LABORATORY DATA:. Appointment on 05/03/2016  Component Date Value Ref Range Status  . WBC 05/03/2016 6.2  3.9 - 10.3 10e3/uL Final  . NEUT# 05/03/2016 5.1  1.5 - 6.5 10e3/uL Final  . HGB 05/03/2016 11.8  11.6 - 15.9 g/dL Final  . HCT 05/03/2016 37.4  34.8 - 46.6 % Final  . Platelets 05/03/2016 489* 145 - 400 10e3/uL Final  . MCV 05/03/2016 92.8  79.5 - 101.0 fL Final  . MCH 05/03/2016 29.3  25.1 - 34.0 pg Final  . MCHC 05/03/2016 31.6  31.5 - 36.0 g/dL Final  . RBC 05/03/2016 4.03  3.70 - 5.45 10e6/uL Final  . RDW 05/03/2016 15.6* 11.2 - 14.5 % Final  . lymph# 05/03/2016 0.3* 0.9 - 3.3 10e3/uL Final  . MONO# 05/03/2016 0.7  0.1 - 0.9 10e3/uL Final  . Eosinophils Absolute 05/03/2016 0.1  0.0 - 0.5 10e3/uL Final  . Basophils Absolute 05/03/2016 0.0  0.0 - 0.1 10e3/uL Final  . NEUT% 05/03/2016 82.4* 38.4 - 76.8 % Final  . LYMPH% 05/03/2016 4.5* 14.0 - 49.7 % Final  . MONO% 05/03/2016 11.2  0.0 - 14.0 % Final  . EOS% 05/03/2016 1.6  0.0 - 7.0 % Final  . BASO% 05/03/2016 0.3  0.0 - 2.0 % Final  . Retic % 05/03/2016 3.53* 0.70 - 2.10 % Final  . Retic Ct Abs 05/03/2016 142.26* 33.70 - 90.70 10e3/uL Final  . Immature Retic Fract 05/03/2016 12.60* 1.60 - 10.00 % Final  . Sodium 05/03/2016 143  136 - 145 mEq/L Final  . Potassium 05/03/2016 3.7  3.5 - 5.1 mEq/L Final  . Chloride 05/03/2016 102  98 - 109 mEq/L Final  . CO2 05/03/2016 29  22 - 29 mEq/L Final  . Glucose 05/03/2016 61* 70 - 140 mg/dl Final   Glucose reference range is for nonfasting patients. Fasting glucose reference range is 70- 100.  [name] Kitchen BUN 05/03/2016 <4.0* 7.0 - 26.0 mg/dL Final  . Creatinine 05/03/2016 0.6  0.6 - 1.1 mg/dL Final  . Total Bilirubin 05/03/2016 <0.30  0.20 - 1.20 mg/dL Final  . Alkaline Phosphatase 05/03/2016 125  40 - 150 U/L Final  . AST 05/03/2016 11  5 - 34  U/L Final  . ALT 05/03/2016 16  0 - 55 U/L Final  . Total Protein 05/03/2016 6.8  6.4 - 8.3 g/dL Final  . Albumin 05/03/2016 2.9* 3.5 - 5.0 g/dL Final  . Calcium 05/03/2016 9.5  8.4 - 10.4 mg/dL Final  . Anion Gap 05/03/2016 11  3 - 11 mEq/L Final  . EGFR 05/03/2016 >90  >90 ml/min/1.73 m2 Final   eGFR is calculated using the CKD-EPI Creatinine Equation (2009)    RADIOGRAPHIC  STUDIES: No results found.  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  Receive cycle 1, day 15 of her AB VD chemotherapy regimen.  She developed a very mild hypersensitivity reaction with the Adriamycin push; which consisted of some mild, generalized itching.  Reaction was managed per protocol; patient was able to complete all of her chemotherapy today without further issues.  Patient is scheduled to return for her Neulasta injection for growth factor support on 05/05/2016.  She is scheduled for PET scan on 05/12/2016.  She is scheduled for labs, flush, visit, and her next cycle of chemotherapy on 05/14/2016.  Hypersensitivity reaction Patient presented to the [facility] today.  Receive cycle 1, day 15 of her AB VD chemotherapy regimen.  She developed a very mild hypersensitivity reaction with the Adriamycin push; which consisted of some mild, generalized itching.  Reaction was managed with Benadryl and Pepcid; and patient was able to complete all of her chemotherapy today without further issues.  Patient is scheduled to return for her Neulasta injection for growth factor support on 05/05/2016.  She is scheduled for PET scan on 05/12/2016.  She is scheduled for labs, flush, visit, and her next cycle of chemotherapy on 05/14/2016.   Patient stated understanding of all instructions; and was in agreement with this plan of care. The patient knows to call the clinic with any problems, questions or concerns.   Total time  spent with patient was 15 minutes;  with greater than 75 percent of that time spent in face to face counseling regarding patient's symptoms,  and coordination of care and follow up.  Disclaimer:This dictation was prepared with Dragon/digital dictation along with [employer]. Any transcriptional errors that result from this process are unintentional.  Drue Second, NP 05/04/2016

## 2016-05-04 NOTE — Assessment & Plan Note (Signed)
Patient presented to the [facility] today.  Receive cycle 1, day 15 of her AB VD chemotherapy regimen.  She developed a very mild hypersensitivity reaction with the Adriamycin push; which consisted of some mild, generalized itching.  Reaction was managed with Benadryl and Pepcid; and patient was able to complete all of her chemotherapy today without further issues.  Patient is scheduled to return for her Neulasta injection for growth factor support on 05/05/2016.  She is scheduled for PET scan on 05/12/2016.  She is scheduled for labs, flush, visit, and her next cycle of chemotherapy on 05/14/2016.

## 2016-05-04 NOTE — Assessment & Plan Note (Signed)
Patient presented to the [facility] today.  Receive cycle 1, day 15 of her AB VD chemotherapy regimen.  She developed a very mild hypersensitivity reaction with the Adriamycin push; which consisted of some mild, generalized itching.  Reaction was managed per protocol; patient was able to complete all of her chemotherapy today without further issues.  Patient is scheduled to return for her Neulasta injection for growth factor support on 05/05/2016.  She is scheduled for PET scan on 05/12/2016.  She is scheduled for labs, flush, visit, and her next cycle of chemotherapy on 05/14/2016.

## 2016-05-05 ENCOUNTER — Encounter: Payer: Self-pay | Admitting: Hematology

## 2016-05-05 ENCOUNTER — Other Ambulatory Visit: Payer: Self-pay | Admitting: *Deleted

## 2016-05-05 ENCOUNTER — Ambulatory Visit: Payer: Self-pay | Attending: Internal Medicine | Admitting: Internal Medicine

## 2016-05-05 ENCOUNTER — Ambulatory Visit (HOSPITAL_BASED_OUTPATIENT_CLINIC_OR_DEPARTMENT_OTHER): Payer: Self-pay

## 2016-05-05 VITALS — BP 121/78 | HR 106 | Temp 98.4°F

## 2016-05-05 DIAGNOSIS — Z5189 Encounter for other specified aftercare: Secondary | ICD-10-CM

## 2016-05-05 DIAGNOSIS — C8128 Mixed cellularity classical Hodgkin lymphoma, lymph nodes of multiple sites: Secondary | ICD-10-CM

## 2016-05-05 MED ORDER — LORATADINE 10 MG PO TABS: 10.0000 mg | ORAL_TABLET | Freq: Every day | ORAL | Status: DC

## 2016-05-05 MED ORDER — PEGFILGRASTIM INJECTION 6 MG/0.6ML ~~LOC~~
6.0000 mg | PREFILLED_SYRINGE | Freq: Once | SUBCUTANEOUS | Status: AC
  Administered 2016-05-05: 6 mg via SUBCUTANEOUS
  Filled 2016-05-05: qty 0.6

## 2016-05-05 MED FILL — PROCHLORPERAZINE 10 MG TAB: 10 | 7 days supply | Qty: 30 | Fill #0

## 2016-05-05 MED FILL — ONDANSETRON HCL 8 MG TABLET: 8 | 10 days supply | Qty: 30 | Fill #0

## 2016-05-05 MED FILL — LORATADINE 10 MG TABLET: 10 | 5 days supply | Qty: 5 | Fill #0

## 2016-05-05 MED FILL — DEXAMETHASONE 4 MG TABLET: 4 | 7 days supply | Qty: 30 | Fill #0

## 2016-05-05 NOTE — Progress Notes (Signed)
Pt is approved for the $400 [facility] grant.  °

## 2016-05-10 ENCOUNTER — Telehealth: Payer: Self-pay | Admitting: *Deleted

## 2016-05-10 ENCOUNTER — Other Ambulatory Visit: Payer: Self-pay

## 2016-05-10 ENCOUNTER — Other Ambulatory Visit: Payer: Self-pay | Admitting: *Deleted

## 2016-05-10 NOTE — Progress Notes (Signed)
Message left with IR to call pt to set up appointment for [facility] placement prior to next chemo on 5/22.  Order placed 5/8 by Dr. Irene Limbo.

## 2016-05-11 ENCOUNTER — Encounter: Payer: Self-pay | Admitting: *Deleted

## 2016-05-11 NOTE — Progress Notes (Signed)
IR Port insertion scheduled for 05/17/16 @ 0930. Called patient and made aware. Patient verbalized understanding.

## 2016-05-12 ENCOUNTER — Ambulatory Visit (HOSPITAL_COMMUNITY)
Admission: RE | Admit: 2016-05-12 | Discharge: 2016-05-12 | Disposition: A | Discharge status: Home / Self Care | Payer: MEDICAID | Source: Ambulatory Visit | Attending: Hematology | Admitting: Hematology

## 2016-05-12 ENCOUNTER — Other Ambulatory Visit: Payer: Self-pay | Admitting: *Deleted

## 2016-05-12 DIAGNOSIS — R918 Other nonspecific abnormal finding of lung field: Secondary | ICD-10-CM | POA: Insufficient documentation

## 2016-05-12 DIAGNOSIS — C8128 Mixed cellularity classical Hodgkin lymphoma, lymph nodes of multiple sites: Secondary | ICD-10-CM

## 2016-05-12 LAB — GLUCOSE, CAPILLARY: GLUCOSE-CAPILLARY: 91 mg/dL (ref 65–99)

## 2016-05-12 MED ORDER — FLUDEOXYGLUCOSE F - 18 (FDG) INJECTION
6.2400 | Freq: Once | INTRAVENOUS | Status: AC | PRN
  Administered 2016-05-12: 6.24 via INTRAVENOUS

## 2016-05-13 ENCOUNTER — Encounter: Payer: Self-pay | Admitting: *Deleted

## 2016-05-13 ENCOUNTER — Ambulatory Visit (HOSPITAL_COMMUNITY)
Admission: RE | Admit: 2016-05-13 | Discharge: 2016-05-13 | Disposition: A | Discharge status: Home / Self Care | Payer: Self-pay | Source: Ambulatory Visit | Attending: Hematology | Admitting: Hematology

## 2016-05-13 ENCOUNTER — Other Ambulatory Visit: Payer: Self-pay | Admitting: Radiology

## 2016-05-13 ENCOUNTER — Other Ambulatory Visit: Payer: Self-pay | Admitting: *Deleted

## 2016-05-13 ENCOUNTER — Telehealth: Payer: Self-pay | Admitting: *Deleted

## 2016-05-13 ENCOUNTER — Other Ambulatory Visit: Payer: Self-pay

## 2016-05-13 DIAGNOSIS — C8128 Mixed cellularity classical Hodgkin lymphoma, lymph nodes of multiple sites: Secondary | ICD-10-CM | POA: Insufficient documentation

## 2016-05-13 LAB — PULMONARY FUNCTION TEST
DL/VA % PRED: 83 %
DL/VA: 4.15 ml/min/mmHg/L
DLCO UNC: 9.82 ml/min/mmHg
DLCO unc % pred: 37 %
FEF 25-75 Pre: 1.08 L/sec
FEF2575-%PRED-PRE: 32 %
FEV1-%PRED-PRE: 41 %
FEV1-PRE: 1.19 L
FEV1FVC-%Pred-Pre: 91 %
FEV6-%PRED-PRE: 45 %
FEV6-PRE: 1.53 L
FEV6FVC-%PRED-PRE: 101 %
FVC-%Pred-Pre: 45 %
FVC-Pre: 1.53 L
PRE FEV6/FVC RATIO: 100 %
Pre FEV1/FVC ratio: 78 %
RV % PRED: 127 %
RV: 1.87 L
TLC % pred: 65 %
TLC: 3.48 L

## 2016-05-13 LAB — FUNGUS CULTURE RESULT

## 2016-05-13 LAB — FUNGUS CULTURE WITH STAIN

## 2016-05-13 LAB — FUNGAL ORGANISM REFLEX

## 2016-05-13 NOTE — Telephone Encounter (Signed)
Per desk RN I have moved appt for treatment from 5/19 to 5/22. Desk RN to contact the patient

## 2016-05-14 ENCOUNTER — Other Ambulatory Visit: Payer: Self-pay | Admitting: Radiology

## 2016-05-14 ENCOUNTER — Ambulatory Visit: Payer: Self-pay

## 2016-05-14 ENCOUNTER — Ambulatory Visit (HOSPITAL_BASED_OUTPATIENT_CLINIC_OR_DEPARTMENT_OTHER): Payer: Self-pay | Admitting: Hematology

## 2016-05-14 ENCOUNTER — Other Ambulatory Visit (HOSPITAL_BASED_OUTPATIENT_CLINIC_OR_DEPARTMENT_OTHER): Payer: Self-pay

## 2016-05-14 ENCOUNTER — Encounter: Payer: Self-pay | Admitting: Hematology

## 2016-05-14 VITALS — BP 112/62 | HR 107 | Temp 98.2°F | Resp 17 | Ht 66.0 in | Wt 131.7 lb

## 2016-05-14 DIAGNOSIS — C8128 Mixed cellularity classical Hodgkin lymphoma, lymph nodes of multiple sites: Secondary | ICD-10-CM

## 2016-05-14 DIAGNOSIS — J9691 Respiratory failure, unspecified with hypoxia: Secondary | ICD-10-CM

## 2016-05-14 DIAGNOSIS — R140 Abdominal distension (gaseous): Secondary | ICD-10-CM

## 2016-05-14 LAB — CBC & DIFF AND RETIC
BASO%: 0.2 % (ref 0.0–2.0)
BASOS ABS: 0 10*3/uL (ref 0.0–0.1)
EOS%: 0.9 % (ref 0.0–7.0)
Eosinophils Absolute: 0.1 10*3/uL (ref 0.0–0.5)
HEMATOCRIT: 36.9 % (ref 34.8–46.6)
HGB: 11.7 g/dL (ref 11.6–15.9)
Immature Retic Fract: 11.5 % — ABNORMAL HIGH (ref 1.60–10.00)
LYMPH#: 1 10*3/uL (ref 0.9–3.3)
LYMPH%: 7.7 % — ABNORMAL LOW (ref 14.0–49.7)
MCH: 30.2 pg (ref 25.1–34.0)
MCHC: 31.7 g/dL (ref 31.5–36.0)
MCV: 95.1 fL (ref 79.5–101.0)
MONO#: 0.5 10*3/uL (ref 0.1–0.9)
MONO%: 3.7 % (ref 0.0–14.0)
NEUT%: 87.5 % — ABNORMAL HIGH (ref 38.4–76.8)
NEUTROS ABS: 11.2 10*3/uL — AB (ref 1.5–6.5)
Platelets: 318 10*3/uL (ref 145–400)
RBC: 3.88 10*6/uL (ref 3.70–5.45)
RDW: 18.8 % — AB (ref 11.2–14.5)
RETIC %: 5.19 % — AB (ref 0.70–2.10)
Retic Ct Abs: 201.37 10*3/uL — ABNORMAL HIGH (ref 33.70–90.70)
WBC: 12.8 10*3/uL — AB (ref 3.9–10.3)

## 2016-05-14 LAB — COMPREHENSIVE METABOLIC PANEL
ALK PHOS: 167 U/L — AB (ref 40–150)
ALT: 11 U/L (ref 0–55)
AST: 10 U/L (ref 5–34)
Albumin: 3.4 g/dL — ABNORMAL LOW (ref 3.5–5.0)
Anion Gap: 9 mEq/L (ref 3–11)
BUN: 11.2 mg/dL (ref 7.0–26.0)
CALCIUM: 9.8 mg/dL (ref 8.4–10.4)
CHLORIDE: 105 meq/L (ref 98–109)
CO2: 29 meq/L (ref 22–29)
Creatinine: 0.7 mg/dL (ref 0.6–1.1)
EGFR: >90 mL/min/{1.73_m2} (ref >90)
Glucose: 84 mg/dl (ref 70–140)
POTASSIUM: 5.1 meq/L (ref 3.5–5.1)
Sodium: 143 mEq/L (ref 136–145)
Total Protein: 6.7 g/dL (ref 6.4–8.3)

## 2016-05-14 NOTE — Progress Notes (Signed)
Heidi [name]  HEMATOLOGY ONCOLOGY PROGRESS NOTE  Date of service: 05/14/2016  Patient Care Team: Maren Reamer, MD as PCP - General (Internal Medicine)  Chief complaint: Follow-up for Hodgkin's lymphoma  Diagnosis: Mixed cellularity Hodgkin's lymphoma IVBE with extensive lymphadenopathy including right axillary, mediastinal and upper retroperitoneal and now biopsy proven pulmonary involvement. She was noted to have significant constitutional symptoms including significant weight loss, fevers chills and some night sweats.  Current Treatment:   Status post 1 cycle of ABVD (without bleomycin due to lung involvement) all  INTERVAL HISTORY:  Heidi [name] is here for follow-up of her Hodgkin's lymphoma prior to her cycle 2 of ABVD. She still smoking 1-2 cigarettes a day all. We discussed that we are still holding her bleomycin for cycle 2 but that we recommend absolute smoking cessation to reduce the risk of bleomycin related lung toxicity. She notes that her breathing is much improved. No significant residual cough at this time. No fevers or chills. Night sweats are nearly resolved. No other acute new symptoms. One of her family members accompanied her for the clinic visit today. She is scheduled for her port and cycle 2 of chemotherapy on 05/17/2016.  REVIEW OF SYSTEMS:    10 Point review of systems of done and is negative except as noted above.  . Past Medical History  Diagnosis Date  . Asthma   . Hodgkin lymphoma ([place])   . Hypertension     . Past Surgical History  Procedure Laterality Date  . Axillary lymph node biopsy Right 03/19/2016    Procedure: AXILLARY LYMPH NODE BIOPSY;  Surgeon: Armandina Gemma, MD;  Location: [facility];  Service: General;  Laterality: Right;    . Social History  Substance Use Topics  . Smoking status: Former Smoker -- 0.50 packs/day for 15 years    Types: Cigarettes    Quit date: 03/27/2016  . Smokeless tobacco: Never Used  . Alcohol Use: Yes     Comment:  occasional now    ALLERGIES:  has No Known Allergies.  MEDICATIONS:  Current Outpatient Prescriptions  Medication Sig Dispense Refill  . dexamethasone (DECADRON) 4 MG tablet 2 tab (8mg ) twice a day for 3 days starting on day of chemotherapy 30 tablet 3  . guaiFENesin (MUCINEX) 600 MG 12 hr tablet Take 1,200 mg by mouth 2 (two) times daily as needed for cough or to loosen phlegm.    . labetalol (NORMODYNE) 100 MG tablet TK 1 T PO  BID  6  . levalbuterol (XOPENEX HFA) 45 MCG/ACT inhaler Inhale 2 puffs into the lungs every 6 (six) hours as needed for wheezing. 1 Inhaler 12  . loratadine (CLARITIN) 10 MG tablet Take 1 tablet (10 mg total) by mouth daily. For up to 5 days following Neulasta injection. 5 tablet 2  . metoprolol (LOPRESSOR) 50 MG tablet Take 1 tablet (50 mg total) by mouth 2 (two) times daily. 60 tablet 0  . ondansetron (ZOFRAN) 8 MG tablet Take 1 tablet (8 mg total) by mouth every 8 (eight) hours as needed for nausea (start on the 3rd day after chemotherapy). 30 tablet 3  . prochlorperazine (COMPAZINE) 10 MG tablet Take 1 tablet (10 mg total) by mouth every 6 (six) hours as needed for nausea or vomiting. 30 tablet 3   No current facility-administered medications for this visit.    PHYSICAL EXAMINATION: ECOG PERFORMANCE STATUS: 1 - Symptomatic but completely ambulatory  . Filed Vitals:   05/14/16 0939  BP: 112/62  Pulse: 107  Temp:  98.2 F (36.8 C)  Resp: 17    Filed Weights   05/14/16 0939  Weight: 131 lb 11.2 oz (59.739 kg)   .Body mass index is 21.27 kg/(m^2).  OROPHARYNX: Moist mucous membranes No thrush NECK: supple, no JVD LYMPH: Small cervical and axillary lymphadenopathy Resolving LUNGS:  improved air entry and with decreased bilateral rhonchi HEART: regular rate & rhythm ABDOMEN: abdomen soft, nontender, normoactive bowel sounds Extremities no edema  NEURO: Awake alert and oriented 3, moving all 4 extremities. No overt focal neurological  deficit.  LABORATORY DATA:   I have reviewed the data as listed  . CBC Latest Ref Rng 05/14/2016 05/03/2016 04/26/2016  WBC 3.9 - 10.3 10e3/uL 12.8(H) 6.2 13.2(H)  Hemoglobin 11.6 - 15.9 g/dL 11.7 11.8 9.6(L)  Hematocrit 34.8 - 46.6 % 36.9 37.4 29.9(L)  Platelets 145 - 400 10e3/uL 318 489(H) 421(H)    . CMP Latest Ref Rng 05/14/2016 05/03/2016 04/26/2016  Glucose 70 - 140 mg/dl 84 61(L) 138(H)  BUN 7.0 - 26.0 mg/dL 11.2 <4.0(L) 7  Creatinine 0.6 - 1.1 mg/dL 0.7 0.6 0.42(L)  Sodium 136 - 145 mEq/L 143 143 139  Potassium 3.5 - 5.1 mEq/L 5.1 3.7 4.3  Chloride 101 - 111 mmol/L - - 95(L)  CO2 22 - 29 mEq/L 29 29 33(H)  Calcium 8.4 - 10.4 mg/dL 9.8 9.5 8.5(L)  Total Protein 6.4 - 8.3 g/dL 6.7 6.8 -  Total Bilirubin 0.20 - 1.20 mg/dL <0.30 <0.30 -  Alkaline Phos 40 - 150 U/L 167(H) 125 -  AST 5 - 34 U/L 10 11 -  ALT 0 - 55 U/L 11 16 -     RADIOGRAPHIC STUDIES: I have personally reviewed the radiological images as listed and agreed with the findings in the report. Ct Abdomen Pelvis Wo Contrast  04/16/2016  CLINICAL DATA:  Lymphoma, respiratory failure, anasarca. EXAM: CT ABDOMEN AND PELVIS WITHOUT CONTRAST TECHNIQUE: Multidetector CT imaging of the abdomen and pelvis was performed following the standard protocol without IV contrast. COMPARISON:  Chest CT 04/10/2016. FINDINGS: Lower chest: Persistent bilateral pleural effusions, bilateral infiltrates and pulmonary nodules. Hepatobiliary: No obvious hepatic lesions but study is limited without IV contrast. The gallbladder is grossly normal. Pancreas: No obvious mass, inflammation or ductal dilatation. Spleen: Normal size.  No obvious lesions. Adrenals/Urinary Tract: The adrenal glands and kidneys are grossly normal. Stomach/Bowel: There is an NG tube in the stomach. The duodenum, small bowel and colon are grossly normal. Vascular/Lymphatic: Retroperitoneal lymphadenopathy. 2 cm retroperitoneal lymph node on image number 42. The aorta is normal in  caliber. No atherosclerotic calcifications. Reproductive: The uterus and ovaries are grossly normal. Other: The bladder contains a Foley catheter. There is a moderate amount of free pelvic fluid. No inguinal adenopathy. Diffuse body wall edema suggesting anasarca. Musculoskeletal: No significant bony findings. IMPRESSION: 1. Limited examination due to lack of IV contrast, artifact from the right arm and motion. 2. Persistent effusions, infiltrates and pulmonary nodules. 3. Diffuse body wall edema suggesting anasarca. 4. Mesenteric and retroperitoneal lymphadenopathy. 5. Moderate free pelvic fluid. Electronically Signed   By: Marijo Sanes M.D.   On: 04/16/2016 11:37   Dg Chest 2 View  04/21/2016  CLINICAL DATA:  Shortness of breath, history of lymphoma EXAM: CHEST  2 VIEW COMPARISON:  04/20/2016 FINDINGS: Cardiomediastinal silhouette is stable. Again noted extensive multifocal nodular consolidation bilaterally right greater than left without change in aeration. Left IJ central line with tip in upper SVC is unchanged in position. No convincing pulmonary edema. IMPRESSION: Again  noted extensive multifocal nodular consolidation bilaterally right greater than left without change in aeration. Left IJ central line with tip in upper SVC is unchanged in position. No convincing pulmonary edema. Electronically Signed   By: Lahoma Crocker M.D.   On: 04/21/2016 11:04   Dg Chest 2 View  04/20/2016  CLINICAL DATA:  Shortness of breath, cough, dizziness.  Lymphoma. EXAM: CHEST  2 VIEW COMPARISON:  04/19/2016 FINDINGS: Dense consolidation throughout much of the right lung. Patchy nodular opacities in the mid and lower lung. Mild cardiomegaly. No visible effusions. No acute bony abnormality. Left central line tip in the SVC, unchanged. IMPRESSION: Extensive bilateral nodular and masslike airspace disease, right much greater than left. No real change since prior study. This presumably represents multifocal pneumonia Electronically  Signed   By: Rolm Baptise M.D.   On: 04/20/2016 09:49   Nm Pet Image Initial (pi) Skull Base To Thigh  05/12/2016  CLINICAL DATA:  Initial treatment strategy for staging of mixed cellularity Hodgkin's lymphoma. EXAM: NUCLEAR MEDICINE PET SKULL BASE TO THIGH TECHNIQUE: 6.2 mCi F-18 FDG was injected intravenously. Full-ring PET imaging was performed from the skull base to thigh after the radiotracer. CT data was obtained and used for attenuation correction and anatomic localization. FASTING BLOOD GLUCOSE:  Value: 91 mg/dl COMPARISON:  Abdominal pelvic CT of 04/16/2016. Chest CT 04/10/2016. FINDINGS: NECK Symmetric hypermetabolism within the bilateral parotid and submandibular glands, favored to be physiologic. Right-sided small cervical nodes which are hypermetabolic. Right level 2 jugular node which measures 9 mm and a S.U.V. max of 2.6 on image 22/series 4. CHEST Hypermetabolic right axillary and subpectoral adenopathy. An index right axillary node measures 1.0 cm and a S.U.V. max of 8.1 on image 56/series 4. Hypermetabolic right worse than left areas of pulmonary consolidation. The periphery of these areas is somewhat nodular. When comparing back to the most remote chest CT of 03/16/2016, the left upper lobe aeration is markedly improved. ABDOMEN/PELVIS Retroperitoneal hypermetabolic adenopathy, including an index pre caval node which measures 1.4 cm and a S.U.V. max of 6.3 on image 134/series 4. No pelvic nodal hypermetabolism. SKELETON Mild, minimally heterogeneous marrow hypermetabolism relatively diffusely. CT IMAGES PERFORMED FOR ATTENUATION CORRECTION No further findings within the neck. Mild cardiomegaly, accentuated by a pectus excavatum deformity. Worsened right upper lobe aeration since 04/10/2016. Right lower and left upper lobe aeration is improved. Probable hepatomegaly. Healing seventh right posterior lateral rib fracture. IMPRESSION: 1. Active lymphoma within the nodal stations of the neck, chest,  and abdomen, as detailed above. 2. Right worse than left hypermetabolic pulmonary consolidation, some of which is relatively nodular. Although pulmonary lymphoma cannot be excluded, the areas of progressive and regressive disease since 04/10/2016 suggests at least a component of this process represents infection. 3. Nonspecific mild marrow hypermetabolism. Favored to be physiologic. Electronically Signed   By: Abigail Miyamoto M.D.   On: 05/12/2016 12:45   Dg Chest Port 1 View  04/19/2016  CLINICAL DATA:  Unsuccessful thoracentesis EXAM: PORTABLE CHEST 1 VIEW COMPARISON:  04/17/2016 FINDINGS: Endotracheal and NG tubes have been removed. Left jugular central venous catheter is stable with its tip at the upper SVC. Extensive bilateral consolidation of right groin left has improved. There is no evidence of pneumothorax. IMPRESSION: No evidence of pneumothorax Bilateral airspace disease right greater than left has improved Extubated. Electronically Signed   By: Marybelle Killings M.D.   On: 04/19/2016 14:36   Dg Chest Port 1 View  04/17/2016  CLINICAL DATA:  Respiratory failure. Hx  asthma, Hodgkin lymphoma, htn EXAM: PORTABLE CHEST 1 VIEW COMPARISON:  04/16/2016 FINDINGS: Endotracheal tube is in place, tip 5.1 cm above the carina. Nasogastric tube is in place with tip off the film beyond the gastroesophageal junction. Left IJ central line tip overlies the level of the superior vena cava. There are dense opacities in the lungs bilaterally, right greater than left. Bilateral pleural effusions are present. IMPRESSION: 1. No significant change in the appearance of lines and tubes. 2. Stable appearance of significant bilateral infiltrates, right greater than left. Electronically Signed   By: Nolon Nations M.D.   On: 04/17/2016 07:38   Dg Chest Port 1 View  04/16/2016  CLINICAL DATA:  Respiratory failure healthcare associated pneumonia intubated patient EXAM: PORTABLE CHEST 1 VIEW COMPARISON:  Portable chest x-ray of April 15, 2016 FINDINGS: The right hemi thorax remains largely obscured with only a small amount of aerated upper lobe visible. The left lung is better aerated but increased density has developed in the retrocardiac region and there is further obscuration of the left hemidiaphragm. A small portion of the left heart border is visible. The pulmonary vascularity is indistinct. The endotracheal tube tip lies 6.4 cm above the carina. The esophagogastric tube tip projects below the inferior margin of the image. The left internal jugular venous catheter tip projects over the proximal SVC. IMPRESSION: Near total opacification of the right hemi thorax consistent with pneumonia and pleural effusion. Increasing opacity on the left consistent with progressive pneumonia. No significant left-sided pleural effusion is visible but is likely obscured. Electronically Signed   By: David  Martinique M.D.   On: 04/16/2016 07:05   Dg Chest Port 1 View  04/15/2016  CLINICAL DATA:  Respiratory failure, evaluate endotracheal tube position EXAM: PORTABLE CHEST 1 VIEW COMPARISON:  04/14/2016 FINDINGS: Endotracheal tube unchanged with tip 5.5 cm above the carina. Orogastric and left IJ support devices stable. Extensive opacification over the right lung stable. Patchy opacification over the left lung stable. IMPRESSION: No change from prior study. Electronically Signed   By: Skipper Cliche M.D.   On: 04/15/2016 07:00    ASSESSMENT & PLAN:   30 year old African-American female with  #1 Mixed cellularity Hodgkin's lymphoma IV B E with extensive lymphadenopathy including right axillary, mediastinal and upper retroperitoneal and now biopsy proven pulmonary involvement. She was noted to have significant constitutional symptoms including significant weight loss, fevers chills and some night sweats. HIV negative Hepatitis C and hepatitis B serologies negative. Echo with normal ejection fraction. Patient is status post first cycle of ABVD without  bleomycin due to significant lung involvement and ongoing O2 requirement.  #2 hypoxic respiratory failure with dense right lung consolidation and left upper lobe consolidation. Pulmonary biopsy +ve for CHL - improved. Currently off oxygen. Appears to have some chronic hypercapnia. Patient notes breathing continues to improve.  #3 ABdominal distension -much  improved. Has retroperitoneal and mesenteric adenopathy from her Hodgkin's lymphoma. Plan Patient is doing well with no prohibitive toxicities. Labs currently stable. -She is appropriate to proceed with C2  ABVD ( without bleomycin) with Neulasta support given 2 recent hospitalizations for possible pneumonia. -She was counseled on absolute smoking cessation given likely use of bleomycin with future cycles. -We'll get pulmonary function testing prior to cycle 3 to determine possibility of adding bleomycin -Port placement on 05/17/2016 prior to chemotherapy.  Return to care with Dr. Irene Limbo in 2 weeks with CBC CMP prior to cycle 2 day 15.  I spent 20 minutes counseling the patient face  to face. The total time spent in the appointment was 25 minutes and more than 50% was on counseling and direct patient cares.    Sullivan Lone MD [place] AAHIVMS [facility] Hematology/Oncology Physician [facility] ([place])  (Office):       [phone] (Work cell):  [phone] (Fax):           [phone]

## 2016-05-15 ENCOUNTER — Ambulatory Visit: Payer: Self-pay

## 2016-05-17 ENCOUNTER — Ambulatory Visit (HOSPITAL_COMMUNITY)
Admission: RE | Admit: 2016-05-17 | Discharge: 2016-05-17 | Disposition: A | Discharge status: Home / Self Care | Payer: Self-pay | Source: Ambulatory Visit | Attending: Hematology | Admitting: Hematology

## 2016-05-17 ENCOUNTER — Ambulatory Visit (HOSPITAL_COMMUNITY)
Admission: RE | Admit: 2016-05-17 | Discharge: 2016-05-17 | Disposition: A | Discharge status: Home / Self Care | Payer: MEDICAID | Source: Ambulatory Visit | Attending: Hematology | Admitting: Hematology

## 2016-05-17 ENCOUNTER — Ambulatory Visit (HOSPITAL_BASED_OUTPATIENT_CLINIC_OR_DEPARTMENT_OTHER): Payer: Self-pay

## 2016-05-17 ENCOUNTER — Other Ambulatory Visit: Payer: Self-pay | Admitting: Hematology

## 2016-05-17 ENCOUNTER — Encounter (HOSPITAL_COMMUNITY): Payer: Self-pay

## 2016-05-17 DIAGNOSIS — Z5111 Encounter for antineoplastic chemotherapy: Secondary | ICD-10-CM

## 2016-05-17 DIAGNOSIS — C8128 Mixed cellularity classical Hodgkin lymphoma, lymph nodes of multiple sites: Secondary | ICD-10-CM

## 2016-05-17 DIAGNOSIS — J45909 Unspecified asthma, uncomplicated: Secondary | ICD-10-CM | POA: Insufficient documentation

## 2016-05-17 DIAGNOSIS — I10 Essential (primary) hypertension: Secondary | ICD-10-CM | POA: Insufficient documentation

## 2016-05-17 DIAGNOSIS — Z87891 Personal history of nicotine dependence: Secondary | ICD-10-CM | POA: Insufficient documentation

## 2016-05-17 DIAGNOSIS — C8120 Mixed cellularity classical Hodgkin lymphoma, unspecified site: Secondary | ICD-10-CM | POA: Insufficient documentation

## 2016-05-17 DIAGNOSIS — Z79899 Other long term (current) drug therapy: Secondary | ICD-10-CM | POA: Insufficient documentation

## 2016-05-17 LAB — CBC
HCT: 34.3 % — ABNORMAL LOW (ref 36.0–46.0)
Hemoglobin: 10.9 g/dL — ABNORMAL LOW (ref 12.0–15.0)
MCH: 28.9 pg (ref 26.0–34.0)
MCHC: 31.8 g/dL (ref 30.0–36.0)
MCV: 91 fL (ref 78.0–100.0)
PLATELETS: 231 10*3/uL (ref 150–400)
RBC: 3.77 MIL/uL — ABNORMAL LOW (ref 3.87–5.11)
RDW: 18.3 % — AB (ref 11.5–15.5)
WBC: 6.1 10*3/uL (ref 4.0–10.5)

## 2016-05-17 LAB — BASIC METABOLIC PANEL
Anion gap: 6 (ref 5–15)
BUN: 11 mg/dL (ref 6–20)
CHLORIDE: 104 mmol/L (ref 101–111)
CO2: 28 mmol/L (ref 22–32)
CREATININE: 0.5 mg/dL (ref 0.44–1.00)
Calcium: 9.2 mg/dL (ref 8.9–10.3)
GFR calc Af Amer: >60 mL/min (ref >60)
GFR calc non Af Amer: >60 mL/min (ref >60)
Glucose, Bld: 113 mg/dL — ABNORMAL HIGH (ref 65–99)
Potassium: 3.9 mmol/L (ref 3.5–5.1)
SODIUM: 138 mmol/L (ref 135–145)

## 2016-05-17 LAB — APTT: aPTT: 26 seconds (ref 24–37)

## 2016-05-17 LAB — PROTIME-INR
INR: 1.08 (ref 0.00–1.49)
Prothrombin Time: 13.8 seconds (ref 11.6–15.2)

## 2016-05-17 LAB — HCG, SERUM, QUALITATIVE: PREG SERUM: NEGATIVE

## 2016-05-17 MED ORDER — SODIUM CHLORIDE 0.9 % IV SOLN
Freq: Once | INTRAVENOUS | Status: AC
  Administered 2016-05-17: 14:00:00 via INTRAVENOUS

## 2016-05-17 MED ORDER — SODIUM CHLORIDE 0.9 % IV SOLN
Freq: Once | INTRAVENOUS | Status: AC
  Administered 2016-05-17: 14:00:00 via INTRAVENOUS
  Filled 2016-05-17: qty 5

## 2016-05-17 MED ORDER — SODIUM CHLORIDE 0.9 % IV SOLN
Freq: Once | INTRAVENOUS | Status: AC
  Administered 2016-05-17: 10:00:00 via INTRAVENOUS

## 2016-05-17 MED ORDER — SODIUM CHLORIDE 0.9% FLUSH
10.0000 mL | INTRAVENOUS | Status: DC | PRN
  Administered 2016-05-17: 10 mL
  Filled 2016-05-17: qty 10

## 2016-05-17 MED ORDER — DOXORUBICIN HCL CHEMO IV INJECTION 2 MG/ML
25.0000 mg/m2 | Freq: Once | INTRAVENOUS | Status: AC
  Administered 2016-05-17: 38 mg via INTRAVENOUS
  Filled 2016-05-17: qty 19

## 2016-05-17 MED ORDER — MIDAZOLAM HCL 2 MG/2ML IJ SOLN
INTRAMUSCULAR | Status: AC | PRN
  Administered 2016-05-17 (×2): 1 mg via INTRAVENOUS

## 2016-05-17 MED ORDER — LIDOCAINE-EPINEPHRINE (PF) 2 %-1:200000 IJ SOLN
INTRAMUSCULAR | Status: AC
  Filled 2016-05-17: qty 20

## 2016-05-17 MED ORDER — VINBLASTINE SULFATE CHEMO INJECTION 1 MG/ML
5.8000 mg/m2 | Freq: Once | INTRAVENOUS | Status: AC
  Administered 2016-05-17: 9 mg via INTRAVENOUS
  Filled 2016-05-17: qty 9

## 2016-05-17 MED ORDER — MIDAZOLAM HCL 2 MG/2ML IJ SOLN
INTRAMUSCULAR | Status: AC
  Filled 2016-05-17: qty 4

## 2016-05-17 MED ORDER — PALONOSETRON HCL INJECTION 0.25 MG/5ML
INTRAVENOUS | Status: AC
  Filled 2016-05-17: qty 5

## 2016-05-17 MED ORDER — LIDOCAINE-EPINEPHRINE (PF) 2 %-1:200000 IJ SOLN
INTRAMUSCULAR | Status: AC | PRN
  Administered 2016-05-17: 20 mL via INTRADERMAL

## 2016-05-17 MED ORDER — PALONOSETRON HCL INJECTION 0.25 MG/5ML
0.2500 mg | Freq: Once | INTRAVENOUS | Status: AC
  Administered 2016-05-17: 0.25 mg via INTRAVENOUS

## 2016-05-17 MED ORDER — HEPARIN SOD (PORK) LOCK FLUSH 100 UNIT/ML IV SOLN
500.0000 [IU] | Freq: Once | INTRAVENOUS | Status: AC | PRN
  Administered 2016-05-17: 500 [IU]
  Filled 2016-05-17: qty 5

## 2016-05-17 MED ORDER — SODIUM CHLORIDE 0.9 % IV SOLN
380.0000 mg/m2 | Freq: Once | INTRAVENOUS | Status: AC
  Administered 2016-05-17: 600 mg via INTRAVENOUS
  Filled 2016-05-17: qty 30

## 2016-05-17 MED ORDER — CEFAZOLIN SODIUM-DEXTROSE 2-4 GM/100ML-% IV SOLN
2.0000 g | INTRAVENOUS | Status: DC
  Filled 2016-05-17: qty 100

## 2016-05-17 MED ORDER — FENTANYL CITRATE (PF) 100 MCG/2ML IJ SOLN
INTRAMUSCULAR | Status: AC | PRN
  Administered 2016-05-17 (×2): 50 ug via INTRAVENOUS

## 2016-05-17 MED ORDER — FENTANYL CITRATE (PF) 100 MCG/2ML IJ SOLN
INTRAMUSCULAR | Status: AC
  Filled 2016-05-17: qty 4

## 2016-05-17 NOTE — Patient Instructions (Signed)
Cyclophosphamide injection What is this medicine? CYCLOPHOSPHAMIDE (sye kloe FOSS fa mide) is a chemotherapy drug. It slows the growth of cancer cells. This medicine is used to treat many types of cancer like lymphoma, myeloma, leukemia, breast cancer, and ovarian cancer, to name a few. This medicine may be used for other purposes; ask your health care provider or pharmacist if you have questions. What should I tell my health care provider before I take this medicine? They need to know if you have any of these conditions: -blood disorders -history of other chemotherapy -infection -kidney disease -liver disease -recent or ongoing radiation therapy -tumors in the bone marrow -an unusual or allergic reaction to cyclophosphamide, other chemotherapy, other medicines, foods, dyes, or preservatives -pregnant or trying to get pregnant -breast-feeding How should I use this medicine? This drug is usually given as an injection into a vein or muscle or by infusion into a vein. It is administered in a hospital or clinic by a specially trained health care professional. Talk to your pediatrician regarding the use of this medicine in children. Special care may be needed. Overdosage: If you think you have taken too much of this medicine contact a poison control center or emergency room at once. NOTE: This medicine is only for you. Do not share this medicine with others. What if I miss a dose? It is important not to miss your dose. Call your doctor or health care professional if you are unable to keep an appointment. What may interact with this medicine? This medicine may interact with the following medications: -amiodarone -amphotericin B -azathioprine -certain antiviral medicines for HIV or AIDS such as protease inhibitors (e.g., indinavir, ritonavir) and zidovudine -certain blood pressure medications such as benazepril, captopril, enalapril, fosinopril, lisinopril, moexipril, monopril, perindopril,  quinapril, ramipril, trandolapril -certain cancer medications such as anthracyclines (e.g., daunorubicin, doxorubicin), busulfan, cytarabine, paclitaxel, pentostatin, tamoxifen, trastuzumab -certain diuretics such as chlorothiazide, chlorthalidone, hydrochlorothiazide, indapamide, metolazone -certain medicines that treat or prevent blood clots like warfarin -certain muscle relaxants such as succinylcholine -cyclosporine -etanercept -indomethacin -medicines to increase blood counts like filgrastim, pegfilgrastim, sargramostim -medicines used as general anesthesia -metronidazole -natalizumab This list may not describe all possible interactions. Give your health care provider a list of all the medicines, herbs, non-prescription drugs, or dietary supplements you use. Also tell them if you smoke, drink alcohol, or use illegal drugs. Some items may interact with your medicine. What should I watch for while using this medicine? Visit your doctor for checks on your progress. This drug may make you feel generally unwell. This is not uncommon, as chemotherapy can affect healthy cells as well as cancer cells. Report any side effects. Continue your course of treatment even though you feel ill unless your doctor tells you to stop. Drink water or other fluids as directed. Urinate often, even at night. In some cases, you may be given additional medicines to help with side effects. Follow all directions for their use. Call your doctor or health care professional for advice if you get a fever, chills or sore throat, or other symptoms of a cold or flu. Do not treat yourself. This drug decreases your body's ability to fight infections. Try to avoid being around people who are sick. This medicine may increase your risk to bruise or bleed. Call your doctor or health care professional if you notice any unusual bleeding. Be careful brushing and flossing your teeth or using a toothpick because you may get an infection or  bleed more easily. If you have  any dental work done, tell your dentist you are receiving this medicine. You may get drowsy or dizzy. Do not drive, use machinery, or do anything that needs mental alertness until you know how this medicine affects you. Do not become pregnant while taking this medicine or for 1 year after stopping it. Women should inform their doctor if they wish to become pregnant or think they might be pregnant. Men should not father a child while taking this medicine and for 4 months after stopping it. There is a potential for serious side effects to an unborn child. Talk to your health care professional or pharmacist for more information. Do not breast-feed an infant while taking this medicine. This medicine may interfere with the ability to have a child. This medicine has caused ovarian failure in some women. This medicine has caused reduced sperm counts in some men. You should talk with your doctor or health care professional if you are concerned about your fertility. If you are going to have surgery, tell your doctor or health care professional that you have taken this medicine. What side effects may I notice from receiving this medicine? Side effects that you should report to your doctor or health care professional as soon as possible: -allergic reactions like skin rash, itching or hives, swelling of the face, lips, or tongue -low blood counts - this medicine may decrease the number of white blood cells, red blood cells and platelets. You may be at increased risk for infections and bleeding. -signs of infection - fever or chills, cough, sore throat, pain or difficulty passing urine -signs of decreased platelets or bleeding - bruising, pinpoint red spots on the skin, black, tarry stools, blood in the urine -signs of decreased red blood cells - unusually weak or tired, fainting spells, lightheadedness -breathing problems -dark urine -dizziness -palpitations -swelling of the  ankles, feet, hands -trouble passing urine or change in the amount of urine -weight gain -yellowing of the eyes or skin Side effects that usually do not require medical attention (report to your doctor or health care professional if they continue or are bothersome): -changes in nail or skin color -hair loss -missed menstrual periods -mouth sores -nausea, vomiting This list may not describe all possible side effects. Call your doctor for medical advice about side effects. You may report side effects to FDA at 1-800-FDA-1088. Where should I keep my medicine? This drug is given in a hospital or clinic and will not be stored at home. NOTE: This sheet is a summary. It may not cover all possible information. If you have questions about this medicine, talk to your doctor, pharmacist, or health care provider.    2016, [employer]. (2012-10-27 16:22:58) Vinblastine injection What is this medicine? VINBLASTINE (vin BLAS teen) is a chemotherapy drug. It slows the growth of cancer cells. This medicine is used to treat many types of cancer like breast cancer, testicular cancer, Hodgkin's disease, non-Hodgkin's lymphoma, and sarcoma. This medicine may be used for other purposes; ask your health care provider or pharmacist if you have questions. What should I tell my health care provider before I take this medicine? They need to know if you have any of these conditions: -blood disorders -dental disease -gout -infection (especially a virus infection such as chickenpox, cold sores, or herpes) -liver disease -lung disease -nervous system disease -recent or ongoing radiation therapy -an unusual or allergic reaction to vinblastine, other chemotherapy agents, other medicines, foods, dyes, or preservatives -pregnant or trying to get pregnant -breast-feeding How  should I use this medicine? This drug is given as an infusion into a vein. It is administered in a hospital or clinic by a specially  trained health care professional. If you have pain, swelling, burning or any unusual feeling around the site of your injection, tell your health care professional right away. Talk to your pediatrician regarding the use of this medicine in children. While this drug may be prescribed for selected conditions, precautions do apply. Overdosage: If you think you have taken too much of this medicine contact a poison control center or emergency room at once. NOTE: This medicine is only for you. Do not share this medicine with others. What if I miss a dose? It is important not to miss your dose. Call your doctor or health care professional if you are unable to keep an appointment. What may interact with this medicine? Do not take this medicine with any of the following medications: -erythromycin -itraconazole -mibefradil -voriconazole This medicine may also interact with the following medications: -cyclosporine -fluconazole -ketoconazole -medicines for seizures like phenytoin -medicines to increase blood counts like filgrastim, pegfilgrastim, sargramostim -vaccines -verapamil Talk to your doctor or health care professional before taking any of these medicines: -acetaminophen -aspirin -ibuprofen -ketoprofen -naproxen This list may not describe all possible interactions. Give your health care provider a list of all the medicines, herbs, non-prescription drugs, or dietary supplements you use. Also tell them if you smoke, drink alcohol, or use illegal drugs. Some items may interact with your medicine. What should I watch for while using this medicine? Your condition will be monitored carefully while you are receiving this medicine. You will need important blood work done while you are taking this medicine. This drug may make you feel generally unwell. This is not uncommon, as chemotherapy can affect healthy cells as well as cancer cells. Report any side effects. Continue your course of treatment even  though you feel ill unless your doctor tells you to stop. In some cases, you may be given additional medicines to help with side effects. Follow all directions for their use. Call your doctor or health care professional for advice if you get a fever, chills or sore throat, or other symptoms of a cold or flu. Do not treat yourself. This drug decreases your body's ability to fight infections. Try to avoid being around people who are sick. This medicine may increase your risk to bruise or bleed. Call your doctor or health care professional if you notice any unusual bleeding. Be careful brushing and flossing your teeth or using a toothpick because you may get an infection or bleed more easily. If you have any dental work done, tell your dentist you are receiving this medicine. Avoid taking products that contain aspirin, acetaminophen, ibuprofen, naproxen, or ketoprofen unless instructed by your doctor. These medicines may hide a fever. Do not become pregnant while taking this medicine. Women should inform their doctor if they wish to become pregnant or think they might be pregnant. There is a potential for serious side effects to an unborn child. Talk to your health care professional or pharmacist for more information. Do not breast-feed an infant while taking this medicine. Men may have a lower sperm count while taking this medicine. Talk to your doctor if you plan to father a child. What side effects may I notice from receiving this medicine? Side effects that you should report to your doctor or health care professional as soon as possible: -allergic reactions like skin rash, itching or hives, swelling  of the face, lips, or tongue -low blood counts - This drug may decrease the number of white blood cells, red blood cells and platelets. You may be at increased risk for infections and bleeding. -signs of infection - fever or chills, cough, sore throat, pain or difficulty passing urine -signs of decreased  platelets or bleeding - bruising, pinpoint red spots on the skin, black, tarry stools, nosebleeds -signs of decreased red blood cells - unusually weak or tired, fainting spells, lightheadedness -breathing problems -changes in hearing -change in the amount of urine -chest pain -high blood pressure -mouth sores -nausea and vomiting -pain, swelling, redness or irritation at the injection site -pain, tingling, numbness in the hands or feet -problems with balance, dizziness -seizures Side effects that usually do not require medical attention (report to your doctor or health care professional if they continue or are bothersome): -constipation -hair loss -jaw pain -loss of appetite -sensitivity to light -stomach pain -tumor pain This list may not describe all possible side effects. Call your doctor for medical advice about side effects. You may report side effects to FDA at 1-800-FDA-1088. Where should I keep my medicine? This drug is given in a hospital or clinic and will not be stored at home. NOTE: This sheet is a summary. It may not cover all possible information. If you have questions about this medicine, talk to your doctor, pharmacist, or health care provider.    2016, [employer]. (2008-09-09 17:15:59) Doxorubicin injection What is this medicine? DOXORUBICIN (dox oh ROO bi sin) is a chemotherapy drug. It is used to treat many kinds of cancer like Hodgkin's disease, leukemia, non-Hodgkin's lymphoma, neuroblastoma, sarcoma, and Wilms' tumor. It is also used to treat bladder cancer, breast cancer, lung cancer, ovarian cancer, stomach cancer, and thyroid cancer. This medicine may be used for other purposes; ask your health care provider or pharmacist if you have questions. What should I tell my health care provider before I take this medicine? They need to know if you have any of these conditions: -blood disorders -heart disease, recent heart attack -infection (especially  a virus infection such as chickenpox, cold sores, or herpes) -irregular heartbeat -liver disease -recent or ongoing radiation therapy -an unusual or allergic reaction to doxorubicin, other chemotherapy agents, other medicines, foods, dyes, or preservatives -pregnant or trying to get pregnant -breast-feeding How should I use this medicine? This drug is given as an infusion into a vein. It is administered in a hospital or clinic by a specially trained health care professional. If you have pain, swelling, burning or any unusual feeling around the site of your injection, tell your health care professional right away. Talk to your pediatrician regarding the use of this medicine in children. Special care may be needed. Overdosage: If you think you have taken too much of this medicine contact a poison control center or emergency room at once. NOTE: This medicine is only for you. Do not share this medicine with others. What if I miss a dose? It is important not to miss your dose. Call your doctor or health care professional if you are unable to keep an appointment. What may interact with this medicine? Do not take this medicine with any of the following medications: -cisapride -droperidol -halofantrine -pimozide -zidovudine This medicine may also interact with the following medications: -chloroquine -chlorpromazine -clarithromycin -cyclophosphamide -cyclosporine -erythromycin -medicines for depression, anxiety, or psychotic disturbances -medicines for irregular heart beat like amiodarone, bepridil, dofetilide, encainide, flecainide, propafenone, quinidine -medicines for seizures like ethotoin, fosphenytoin, phenytoin -  medicines for nausea, vomiting like dolasetron, ondansetron, palonosetron -medicines to increase blood counts like filgrastim, pegfilgrastim, sargramostim -methadone -methotrexate -pentamidine -progesterone -vaccines -verapamil Talk to your doctor or health care  professional before taking any of these medicines: -acetaminophen -aspirin -ibuprofen -ketoprofen -naproxen This list may not describe all possible interactions. Give your health care provider a list of all the medicines, herbs, non-prescription drugs, or dietary supplements you use. Also tell them if you smoke, drink alcohol, or use illegal drugs. Some items may interact with your medicine. What should I watch for while using this medicine? Your condition will be monitored carefully while you are receiving this medicine. You will need important blood work done while you are taking this medicine. This drug may make you feel generally unwell. This is not uncommon, as chemotherapy can affect healthy cells as well as cancer cells. Report any side effects. Continue your course of treatment even though you feel ill unless your doctor tells you to stop. Your urine may turn red for a few days after your dose. This is not blood. If your urine is dark or brown, call your doctor. In some cases, you may be given additional medicines to help with side effects. Follow all directions for their use. Call your doctor or health care professional for advice if you get a fever, chills or sore throat, or other symptoms of a cold or flu. Do not treat yourself. This drug decreases your body's ability to fight infections. Try to avoid being around people who are sick. This medicine may increase your risk to bruise or bleed. Call your doctor or health care professional if you notice any unusual bleeding. Be careful brushing and flossing your teeth or using a toothpick because you may get an infection or bleed more easily. If you have any dental work done, tell your dentist you are receiving this medicine. Avoid taking products that contain aspirin, acetaminophen, ibuprofen, naproxen, or ketoprofen unless instructed by your doctor. These medicines may hide a fever. Men and women of childbearing age should use effective birth  control methods while using taking this medicine. Do not become pregnant while taking this medicine. There is a potential for serious side effects to an unborn child. Talk to your health care professional or pharmacist for more information. Do not breast-feed an infant while taking this medicine. Do not let others touch your urine or other body fluids for 5 days after each treatment with this medicine. Caregivers should wear latex gloves to avoid touching body fluids during this time. There is a maximum amount of this medicine you should receive throughout your life. The amount depends on the medical condition being treated and your overall health. Your doctor will watch how much of this medicine you receive in your lifetime. Tell your doctor if you have taken this medicine before. What side effects may I notice from receiving this medicine? Side effects that you should report to your doctor or health care professional as soon as possible: -allergic reactions like skin rash, itching or hives, swelling of the face, lips, or tongue -low blood counts - this medicine may decrease the number of white blood cells, red blood cells and platelets. You may be at increased risk for infections and bleeding. -signs of infection - fever or chills, cough, sore throat, pain or difficulty passing urine -signs of decreased platelets or bleeding - bruising, pinpoint red spots on the skin, black, tarry stools, blood in the urine -signs of decreased red blood cells - unusually weak  or tired, fainting spells, lightheadedness -breathing problems -chest pain -fast, irregular heartbeat -mouth sores -nausea, vomiting -pain, swelling, redness at site where injected -pain, tingling, numbness in the hands or feet -swelling of ankles, feet, or hands -unusual bleeding or bruising Side effects that usually do not require medical attention (report to your doctor or health care professional if they continue or are  bothersome): -diarrhea -facial flushing -hair loss -loss of appetite -missed menstrual periods -nail discoloration or damage -red or watery eyes -red colored urine -stomach upset This list may not describe all possible side effects. Call your doctor for medical advice about side effects. You may report side effects to FDA at 1-800-FDA-1088. Where should I keep my medicine? This drug is given in a hospital or clinic and will not be stored at home. NOTE: This sheet is a summary. It may not cover all possible information. If you have questions about this medicine, talk to your doctor, pharmacist, or health care provider.    2016, [employer]. (2013-04-10 09:54:34)

## 2016-05-17 NOTE — Progress Notes (Signed)
Pt arrived from IR post Port insertion. Pt is scratching and states she" itches all the time" and this is no different than usual for her. No rash seen

## 2016-05-17 NOTE — H&P (Signed)
Chief Complaint: lymphoma, needs PAC for chemo  Referring Physician:Dr. Sullivan Lone  Supervising Physician: Arne Cleveland  Patient Status:Out-pt  HPI: Heidi [name] is an 30 y.o. female who has a history of mixed cellularity Hodgkin's lymphoma IVB who was recently admitted to the hospital with respiratory failure secondary to PNA that required intubation.  She has since improved from this and has followed up with Dr. Irene Limbo.  He has initiated chemotherapy already, but would like to continue this, but now with a port a cath.  She presents today for this procedure.  Past Medical History:  Past Medical History  Diagnosis Date  . Asthma   . Hodgkin lymphoma ([place])   . Hypertension     Past Surgical History:  Past Surgical History  Procedure Laterality Date  . Axillary lymph node biopsy Right 03/19/2016    Procedure: AXILLARY LYMPH NODE BIOPSY;  Surgeon: Armandina Gemma, MD;  Location: [facility];  Service: General;  Laterality: Right;    Family History: History reviewed. No pertinent family history.  Social History:  reports that she quit smoking about 7 weeks ago. Her smoking use included Cigarettes. She has a 7.5 pack-year smoking history. She has never used smokeless tobacco. She reports that she drinks alcohol. Her drug history is not on file.  Allergies: No Known Allergies  Medications:   Medication List    ASK your doctor about these medications        dexamethasone 4 MG tablet  Commonly known as:  DECADRON  2 tab (8mg ) twice a day for 3 days starting on day of chemotherapy     guaiFENesin 600 MG 12 hr tablet  Commonly known as:  MUCINEX  Take 1,200 mg by mouth 2 (two) times daily as needed for cough or to loosen phlegm.     labetalol 100 MG tablet  Commonly known as:  NORMODYNE  TK 1 T PO  BID     levalbuterol 45 MCG/ACT inhaler  Commonly known as:  XOPENEX HFA  Inhale 2 puffs into the lungs every 6 (six) hours as needed for wheezing.     loratadine 10 MG  tablet  Commonly known as:  CLARITIN  Take 1 tablet (10 mg total) by mouth daily. For up to 5 days following Neulasta injection.     metoprolol 50 MG tablet  Commonly known as:  LOPRESSOR  Take 1 tablet (50 mg total) by mouth 2 (two) times daily.     ondansetron 8 MG tablet  Commonly known as:  ZOFRAN  Take 1 tablet (8 mg total) by mouth every 8 (eight) hours as needed for nausea (start on the 3rd day after chemotherapy).     prochlorperazine 10 MG tablet  Commonly known as:  COMPAZINE  Take 1 tablet (10 mg total) by mouth every 6 (six) hours as needed for nausea or vomiting.        Please HPI for pertinent positives, otherwise complete 10 system ROS negative.  Mallampati Score: MD Evaluation Airway: WNL Heart: WNL Abdomen: WNL Chest/ Lungs: WNL ASA  Classification: 2 Mallampati/Airway Score: One  Physical Exam: LMP 02/13/2016 (Approximate) There is no weight on file to calculate BMI. General: pleasant, WD, WN black female who is laying in bed in NAD HEENT: head is normocephalic, atraumatic.  Sclera are noninjected.  PERRL.  Ears and nose without any masses or lesions.  Mouth is pink and moist Heart: regular, rate, and rhythm.  Normal s1,s2. No obvious murmurs, gallops, or rubs noted.  Palpable radial and pedal pulses bilaterally Lungs: CTAB, no wheezes, rhonchi, or rales noted.  Respiratory effort nonlabored Abd: soft, NT, ND, +BS, no masses, hernias, or organomegaly MS: all 4 extremities are symmetrical with no cyanosis, clubbing, or edema. Psych: A&Ox3 with an appropriate affect.   Labs: No results found for this or any previous visit (from the past 48 hour(s)).  Imaging: No results found.  Assessment/Plan 1. Mixed cellularity Hodgkin's Lymphoma IVB -plan for placement of a port a cath today for ease during chemotherapy. -labs are pending, but vitals have been reviewed. -Risks and Benefits discussed with the patient including, but not limited to bleeding,  infection, pneumothorax, or fibrin sheath development and need for additional procedures. All of the patient's questions were answered, patient is agreeable to proceed. Consent signed and in chart.  Thank you for this interesting consult.  I greatly enjoyed meeting Heidi [name] and look forward to participating in their care.  A copy of this report was sent to the requesting provider on this date.  Electronically Signed: Henreitta Cea 05/17/2016, 10:13 AM   I spent a total of  30 Minutes  in face to face in clinical consultation, greater than 50% of which was counseling/coordinating care for hodgkin's lymphoma, needs PAC

## 2016-05-17 NOTE — Procedures (Signed)
R IJ Port cathter placement with US and fluoroscopy No complication No blood loss. See complete dictation in [facility].  

## 2016-05-17 NOTE — Progress Notes (Signed)
Called emla cream in to [facility].

## 2016-05-17 NOTE — Discharge Instructions (Signed)
Implanted Port Home Guide °An implanted port is a type of central line that is placed under the skin. Central lines are used to provide IV access when treatment or nutrition needs to be given through a person's veins. Implanted ports are used for long-term IV access. An implanted port may be placed because:  °· You need IV medicine that would be irritating to the small veins in your hands or arms.   °· You need long-term IV medicines, such as antibiotics.   °· You need IV nutrition for a long period.   °· You need frequent blood draws for lab tests.   °· You need dialysis.   °Implanted ports are usually placed in the chest area, but they can also be placed in the upper arm, the abdomen, or the leg. An implanted port has two main parts:  °· Reservoir. The reservoir is round and will appear as a small, raised area under your skin. The reservoir is the part where a needle is inserted to give medicines or draw blood.   °· Catheter. The catheter is a thin, flexible tube that extends from the reservoir. The catheter is placed into a large vein. Medicine that is inserted into the reservoir goes into the catheter and then into the vein.   °HOW WILL I CARE FOR MY INCISION SITE? °Do not get the incision site wet. Bathe or shower as directed by your health care provider.  °HOW IS MY PORT ACCESSED? °Special steps must be taken to access the port:  °· Before the port is accessed, a numbing cream can be placed on the skin. This helps numb the skin over the port site.   °· Your health care provider uses a sterile technique to access the port. °· Your health care provider must put on a mask and sterile gloves. °· The skin over your port is cleaned carefully with an antiseptic and allowed to dry. °· The port is gently pinched between sterile gloves, and a needle is inserted into the port. °· Only "non-coring" port needles should be used to access the port. Once the port is accessed, a blood return should be checked. This helps  ensure that the port is in the vein and is not clogged.   °· If your port needs to remain accessed for a constant infusion, a clear (transparent) bandage will be placed over the needle site. The bandage and needle will need to be changed every week, or as directed by your health care provider.   °· Keep the bandage covering the needle clean and dry. Do not get it wet. Follow your health care provider's instructions on how to take a shower or bath while the port is accessed.   °· If your port does not need to stay accessed, no bandage is needed over the port.   °WHAT IS FLUSHING? °Flushing helps keep the port from getting clogged. Follow your health care provider's instructions on how and when to flush the port. Ports are usually flushed with saline solution or a medicine called heparin. The need for flushing will depend on how the port is used.  °· If the port is used for intermittent medicines or blood draws, the port will need to be flushed:   °· After medicines have been given.   °· After blood has been drawn.   °· As part of routine maintenance.   °· If a constant infusion is running, the port may not need to be flushed.   °HOW LONG WILL MY PORT STAY IMPLANTED? °The port can stay in for as long as your health care   provider thinks it is needed. When it is time for the port to come out, surgery will be done to remove it. The procedure is similar to the one performed when the port was put in.  °WHEN SHOULD I SEEK IMMEDIATE MEDICAL CARE? °When you have an implanted port, you should seek immediate medical care if:  °· You notice a bad smell coming from the incision site.   °· You have swelling, redness, or drainage at the incision site.   °· You have more swelling or pain at the port site or the surrounding area.   °· You have a fever that is not controlled with medicine. °  °This information is not intended to replace advice given to you by your health care provider. Make sure you discuss any questions you have with  your health care provider. °  °Document Released: 12/13/2005 Document Revised: 10/03/2013 Document Reviewed: 08/20/2013 °Elsevier Interactive Patient Education ©2016 Elsevier Inc. °Implanted Port Insertion, Care After °Refer to this sheet in the next few weeks. These instructions provide you with information on caring for yourself after your procedure. Your health care provider may also give you more specific instructions. Your treatment has been planned according to current medical practices, but problems sometimes occur. Call your health care provider if you have any problems or questions after your procedure. °WHAT TO EXPECT AFTER THE PROCEDURE °After your procedure, it is typical to have the following:  °· Discomfort at the port insertion site. Ice packs to the area will help. °· Bruising on the skin over the port. This will subside in 3-4 days. °HOME CARE INSTRUCTIONS °· After your port is placed, you will get a manufacturer's information card. The card has information about your port. Keep this card with you at all times.   °· Know what kind of port you have. There are many types of ports available.   °· Wear a medical alert bracelet in case of an emergency. This can help alert health care workers that you have a port.   °· The port can stay in for as long as your health care provider believes it is necessary.   °· A home health care nurse may give medicines and take care of the port.   °· You or a family member can get special training and directions for giving medicine and taking care of the port at home.   °SEEK MEDICAL CARE IF:  °· Your port does not flush or you are unable to get a blood return.   °· You have a fever or chills. °SEEK IMMEDIATE MEDICAL CARE IF: °· You have new fluid or pus coming from your incision.   °· You notice a bad smell coming from your incision site.   °· You have swelling, pain, or more redness at the incision or port site.   °· You have chest pain or shortness of breath. °  °This  information is not intended to replace advice given to you by your health care provider. Make sure you discuss any questions you have with your health care provider. °  °Document Released: 10/03/2013 Document Revised: 12/18/2013 Document Reviewed: 10/03/2013 °Elsevier Interactive Patient Education ©2016 Elsevier Inc[employer] °Moderate Conscious Sedation, Adult, Care After °Refer to this sheet in the next few weeks. These instructions provide you with information on caring for yourself after your procedure. Your health care provider may also give you more specific instructions. Your treatment has been planned according to current medical practices, but problems sometimes occur. Call your health care provider if you have any problems or questions after your   procedure. °WHAT TO EXPECT AFTER THE PROCEDURE  °After your procedure: °· You may feel sleepy, clumsy, and have poor balance for several hours. °· Vomiting may occur if you eat too soon after the procedure. °HOME CARE INSTRUCTIONS °· Do not participate in any activities where you could become injured for at least 24 hours. Do not: °¨ Drive. °¨ Swim. °¨ Ride a bicycle. °¨ Operate heavy machinery. °¨ Cook. °¨ Use power tools. °¨ Climb ladders. °¨ Work from a high place. °· Do not make important decisions or sign legal documents until you are improved. °· If you vomit, drink water, juice, or soup when you can drink without vomiting. Make sure you have little or no nausea before eating solid foods. °· Only take over-the-counter or prescription medicines for pain, discomfort, or fever as directed by your health care provider. °· Make sure you and your family fully understand everything about the medicines given to you, including what side effects may occur. °· You should not drink alcohol, take sleeping pills, or take medicines that cause drowsiness for at least 24 hours. °· If you smoke, do not smoke without supervision. °· If you are feeling better, you may resume normal  activities 24 hours after you were sedated. °· Keep all appointments with your health care provider. °SEEK MEDICAL CARE IF: °· Your skin is pale or bluish in color. °· You continue to feel nauseous or vomit. °· Your pain is getting worse and is not helped by medicine. °· You have bleeding or swelling. °· You are still sleepy or feeling clumsy after 24 hours. °SEEK IMMEDIATE MEDICAL CARE IF: °· You develop a rash. °· You have difficulty breathing. °· You develop any type of allergic problem. °· You have a fever. °MAKE SURE YOU: °· Understand these instructions. °· Will watch your condition. °· Will get help right away if you are not doing well or get worse. °  °This information is not intended to replace advice given to you by your health care provider. Make sure you discuss any questions you have with your health care provider. °  °Document Released: 10/03/2013 Document Revised: 01/03/2015 Document Reviewed: 10/03/2013 °Elsevier Interactive Patient Education ©2016 Elsevier Inc. ° °

## 2016-05-17 NOTE — Progress Notes (Signed)
Continues to itch but states this is typical for her. Pt tearful at times. At discharge from Short Stay  She is to go to [facility] [place] for her infusion. PAC right chest is accessed and clamped off and caps x2. Occlusive dressing intact

## 2016-05-19 ENCOUNTER — Ambulatory Visit (HOSPITAL_BASED_OUTPATIENT_CLINIC_OR_DEPARTMENT_OTHER): Payer: Self-pay

## 2016-05-19 VITALS — BP 113/59 | HR 95 | Temp 98.3°F | Resp 20

## 2016-05-19 DIAGNOSIS — C8128 Mixed cellularity classical Hodgkin lymphoma, lymph nodes of multiple sites: Secondary | ICD-10-CM

## 2016-05-19 DIAGNOSIS — Z5189 Encounter for other specified aftercare: Secondary | ICD-10-CM

## 2016-05-19 MED ORDER — PEGFILGRASTIM INJECTION 6 MG/0.6ML ~~LOC~~
6.0000 mg | PREFILLED_SYRINGE | Freq: Once | SUBCUTANEOUS | Status: AC
  Administered 2016-05-19: 6 mg via SUBCUTANEOUS
  Filled 2016-05-19: qty 0.6

## 2016-05-19 NOTE — Patient Instructions (Signed)
Pegfilgrastim injection What is this medicine? PEGFILGRASTIM (PEG fil gra stim) is a long-acting granulocyte colony-stimulating factor that stimulates the growth of neutrophils, a type of white blood cell important in the body's fight against infection. It is used to reduce the incidence of fever and infection in patients with certain types of cancer who are receiving chemotherapy that affects the bone marrow, and to increase survival after being exposed to high doses of radiation. This medicine may be used for other purposes; ask your health care provider or pharmacist if you have questions. What should I tell my health care provider before I take this medicine? They need to know if you have any of these conditions: -kidney disease -latex allergy -ongoing radiation therapy -sickle cell disease -skin reactions to acrylic adhesives (On-Body Injector only) -an unusual or allergic reaction to pegfilgrastim, filgrastim, other medicines, foods, dyes, or preservatives -pregnant or trying to get pregnant -breast-feeding How should I use this medicine? This medicine is for injection under the skin. If you get this medicine at home, you will be taught how to prepare and give the pre-filled syringe or how to use the On-body Injector. Refer to the patient Instructions for Use for detailed instructions. Use exactly as directed. Take your medicine at regular intervals. Do not take your medicine more often than directed. It is important that you put your used needles and syringes in a special sharps container. Do not put them in a trash can. If you do not have a sharps container, call your pharmacist or healthcare provider to get one. Talk to your pediatrician regarding the use of this medicine in children. While this drug may be prescribed for selected conditions, precautions do apply. Overdosage: If you think you have taken too much of this medicine contact a poison control center or emergency room at  once. NOTE: This medicine is only for you. Do not share this medicine with others. What if I miss a dose? It is important not to miss your dose. Call your doctor or health care professional if you miss your dose. If you miss a dose due to an On-body Injector failure or leakage, a new dose should be administered as soon as possible using a single prefilled syringe for manual use. What may interact with this medicine? Interactions have not been studied. Give your health care provider a list of all the medicines, herbs, non-prescription drugs, or dietary supplements you use. Also tell them if you smoke, drink alcohol, or use illegal drugs. Some items may interact with your medicine. This list may not describe all possible interactions. Give your health care provider a list of all the medicines, herbs, non-prescription drugs, or dietary supplements you use. Also tell them if you smoke, drink alcohol, or use illegal drugs. Some items may interact with your medicine. What should I watch for while using this medicine? You may need blood work done while you are taking this medicine. If you are going to need a MRI, CT scan, or other procedure, tell your doctor that you are using this medicine (On-Body Injector only). What side effects may I notice from receiving this medicine? Side effects that you should report to your doctor or health care professional as soon as possible: -allergic reactions like skin rash, itching or hives, swelling of the face, lips, or tongue -dizziness -fever -pain, redness, or irritation at site where injected -pinpoint red spots on the skin -red or dark-brown urine -shortness of breath or breathing problems -stomach or side pain, or pain   at the shoulder -swelling -tiredness -trouble passing urine or change in the amount of urine Side effects that usually do not require medical attention (report to your doctor or health care professional if they continue or are  bothersome): -bone pain -muscle pain This list may not describe all possible side effects. Call your doctor for medical advice about side effects. You may report side effects to FDA at 1-800-FDA-1088. Where should I keep my medicine? Keep out of the reach of children. Store pre-filled syringes in a refrigerator between 2 and 8 degrees C (36 and 46 degrees F). Do not freeze. Keep in carton to protect from light. Throw away this medicine if it is left out of the refrigerator for more than 48 hours. Throw away any unused medicine after the expiration date. NOTE: This sheet is a summary. It may not cover all possible information. If you have questions about this medicine, talk to your doctor, pharmacist, or health care provider.    2016, Elsevier/Gold Standard. (2015-01-02 14:30:14)  

## 2016-05-28 ENCOUNTER — Other Ambulatory Visit: Payer: Self-pay | Admitting: *Deleted

## 2016-05-28 DIAGNOSIS — C8128 Mixed cellularity classical Hodgkin lymphoma, lymph nodes of multiple sites: Secondary | ICD-10-CM

## 2016-05-28 LAB — LEGIONELLA CULTURE

## 2016-05-28 LAB — ACID FAST CULTURE WITH REFLEXED SENSITIVITIES: ACID FAST CULTURE - AFSCU3: NEGATIVE

## 2016-05-31 ENCOUNTER — Other Ambulatory Visit: Payer: Self-pay

## 2016-05-31 ENCOUNTER — Ambulatory Visit (HOSPITAL_BASED_OUTPATIENT_CLINIC_OR_DEPARTMENT_OTHER): Payer: Self-pay

## 2016-05-31 ENCOUNTER — Ambulatory Visit (HOSPITAL_BASED_OUTPATIENT_CLINIC_OR_DEPARTMENT_OTHER): Payer: Self-pay | Admitting: Hematology

## 2016-05-31 ENCOUNTER — Other Ambulatory Visit (HOSPITAL_BASED_OUTPATIENT_CLINIC_OR_DEPARTMENT_OTHER): Payer: Self-pay

## 2016-05-31 ENCOUNTER — Encounter: Payer: Self-pay | Admitting: Hematology

## 2016-05-31 ENCOUNTER — Telehealth: Payer: Self-pay | Admitting: *Deleted

## 2016-05-31 ENCOUNTER — Telehealth: Payer: Self-pay | Admitting: Hematology

## 2016-05-31 VITALS — BP 125/82 | HR 74 | Temp 97.8°F | Resp 17 | Ht 66.0 in | Wt 141.9 lb

## 2016-05-31 DIAGNOSIS — Z5111 Encounter for antineoplastic chemotherapy: Secondary | ICD-10-CM

## 2016-05-31 DIAGNOSIS — L819 Disorder of pigmentation, unspecified: Secondary | ICD-10-CM

## 2016-05-31 DIAGNOSIS — R140 Abdominal distension (gaseous): Secondary | ICD-10-CM

## 2016-05-31 DIAGNOSIS — C8128 Mixed cellularity classical Hodgkin lymphoma, lymph nodes of multiple sites: Secondary | ICD-10-CM

## 2016-05-31 DIAGNOSIS — J9691 Respiratory failure, unspecified with hypoxia: Secondary | ICD-10-CM

## 2016-05-31 LAB — CBC & DIFF AND RETIC
BASO%: 0.2 % (ref 0.0–2.0)
Basophils Absolute: 0 10*3/uL (ref 0.0–0.1)
EOS%: 2.5 % (ref 0.0–7.0)
Eosinophils Absolute: 0.1 10*3/uL (ref 0.0–0.5)
HCT: 36.9 % (ref 34.8–46.6)
HEMOGLOBIN: 12.1 g/dL (ref 11.6–15.9)
Immature Retic Fract: 6.8 % (ref 1.60–10.00)
LYMPH%: 4.2 % — ABNORMAL LOW (ref 14.0–49.7)
MCH: 30.9 pg (ref 25.1–34.0)
MCHC: 32.8 g/dL (ref 31.5–36.0)
MCV: 94.1 fL (ref 79.5–101.0)
MONO#: 0.9 10*3/uL (ref 0.1–0.9)
MONO%: 17 % — AB (ref 0.0–14.0)
NEUT%: 76.1 % (ref 38.4–76.8)
NEUTROS ABS: 4 10*3/uL (ref 1.5–6.5)
Platelets: 225 10*3/uL (ref 145–400)
RBC: 3.92 10*6/uL (ref 3.70–5.45)
RDW: 18 % — AB (ref 11.2–14.5)
RETIC %: 2.09 % (ref 0.70–2.10)
Retic Ct Abs: 81.93 10*3/uL (ref 33.70–90.70)
WBC: 5.3 10*3/uL (ref 3.9–10.3)
lymph#: 0.2 10*3/uL — ABNORMAL LOW (ref 0.9–3.3)

## 2016-05-31 LAB — COMPREHENSIVE METABOLIC PANEL
ALBUMIN: 3.6 g/dL (ref 3.5–5.0)
ALK PHOS: 104 U/L (ref 40–150)
ALT: <9 U/L (ref 0–55)
AST: 11 U/L (ref 5–34)
Anion Gap: 8 mEq/L (ref 3–11)
BUN: 7.6 mg/dL (ref 7.0–26.0)
CHLORIDE: 105 meq/L (ref 98–109)
CO2: 27 mEq/L (ref 22–29)
Calcium: 9.5 mg/dL (ref 8.4–10.4)
Creatinine: 0.7 mg/dL (ref 0.6–1.1)
EGFR: >90 mL/min/{1.73_m2} (ref >90)
GLUCOSE: 82 mg/dL (ref 70–140)
POTASSIUM: 4.5 meq/L (ref 3.5–5.1)
SODIUM: 141 meq/L (ref 136–145)
Total Bilirubin: 0.44 mg/dL (ref 0.20–1.20)
Total Protein: 6.8 g/dL (ref 6.4–8.3)

## 2016-05-31 MED ORDER — SODIUM CHLORIDE 0.9 % IV SOLN
600.0000 mg | Freq: Once | INTRAVENOUS | Status: AC
  Administered 2016-05-31: 600 mg via INTRAVENOUS
  Filled 2016-05-31: qty 30

## 2016-05-31 MED ORDER — HEPARIN SOD (PORK) LOCK FLUSH 100 UNIT/ML IV SOLN
500.0000 [IU] | Freq: Once | INTRAVENOUS | Status: AC | PRN
  Administered 2016-05-31: 500 [IU]
  Filled 2016-05-31: qty 5

## 2016-05-31 MED ORDER — PALONOSETRON HCL INJECTION 0.25 MG/5ML
INTRAVENOUS | Status: AC
  Filled 2016-05-31: qty 5

## 2016-05-31 MED ORDER — VINBLASTINE SULFATE CHEMO INJECTION 1 MG/ML
10.0000 mg | Freq: Once | INTRAVENOUS | Status: AC
  Administered 2016-05-31: 10 mg via INTRAVENOUS
  Filled 2016-05-31: qty 10

## 2016-05-31 MED ORDER — SODIUM CHLORIDE 0.9 % IV SOLN
Freq: Once | INTRAVENOUS | Status: AC
  Administered 2016-05-31: 14:00:00 via INTRAVENOUS
  Filled 2016-05-31: qty 5

## 2016-05-31 MED ORDER — PALONOSETRON HCL INJECTION 0.25 MG/5ML
0.2500 mg | Freq: Once | INTRAVENOUS | Status: AC
  Administered 2016-05-31: 0.25 mg via INTRAVENOUS

## 2016-05-31 MED ORDER — DOXORUBICIN HCL CHEMO IV INJECTION 2 MG/ML
40.0000 mg | Freq: Once | INTRAVENOUS | Status: AC
  Administered 2016-05-31: 40 mg via INTRAVENOUS
  Filled 2016-05-31: qty 20

## 2016-05-31 MED ORDER — SODIUM CHLORIDE 0.9% FLUSH
10.0000 mL | INTRAVENOUS | Status: DC | PRN
  Administered 2016-05-31: 10 mL
  Filled 2016-05-31: qty 10

## 2016-05-31 MED ORDER — SODIUM CHLORIDE 0.9 % IV SOLN
Freq: Once | INTRAVENOUS | Status: AC
  Administered 2016-05-31: 13:00:00 via INTRAVENOUS

## 2016-05-31 MED ORDER — LIDOCAINE-PRILOCAINE 2.5-2.5 % EX CREA
TOPICAL_CREAM | CUTANEOUS | Status: AC
  Filled 2016-05-31: qty 5

## 2016-05-31 NOTE — Telephone Encounter (Signed)
FYI  [facility] Nurse [place] RN called with "patient request I've never received.  Going out to dinner this evening and would like to drink alcoholic beverages with this evening's meal.  Received ABVD chemotherapy today." Advised no alcoholic beverages on same chemotherapy treatment day.  Wait at least 24 to 48 hours after receipt to prevent drug interference with chemotherapy or affecting how chemotherapy or pre-medicines work.  Medicines/Chemicals are in her blood stream.  Shared this nurses history of other oncology patients reporting hallucinations, very sick having bad experiences  drinking alcohol on treatment day.   Scheduled 06-01-2016 for injection.

## 2016-05-31 NOTE — Patient Instructions (Signed)
[facility] Discharge Instructions for Patients Receiving Chemotherapy  Today you received the following chemotherapy agents Adriamycin/Velban/Dacarbazine.  To help prevent nausea and vomiting after your treatment, we encourage you to take your nausea medication as prescribed    If you develop nausea and vomiting that is not controlled by your nausea medication, call the clinic.   BELOW ARE SYMPTOMS THAT SHOULD BE REPORTED IMMEDIATELY:  *FEVER GREATER THAN 100.5 F  *CHILLS WITH OR WITHOUT FEVER  NAUSEA AND VOMITING THAT IS NOT CONTROLLED WITH YOUR NAUSEA MEDICATION  *UNUSUAL SHORTNESS OF BREATH  *UNUSUAL BRUISING OR BLEEDING  TENDERNESS IN MOUTH AND THROAT WITH OR WITHOUT PRESENCE OF ULCERS  *URINARY PROBLEMS  *BOWEL PROBLEMS  UNUSUAL RASH Items with * indicate a potential emergency and should be followed up as soon as possible.  Feel free to call the clinic you have any questions or concerns. The clinic phone number is [phone].  Please show the [facility] at check-in to the Emergency Department and triage nurse.

## 2016-05-31 NOTE — Telephone Encounter (Signed)
left msg confirming 6/19 apt

## 2016-06-01 ENCOUNTER — Ambulatory Visit (HOSPITAL_BASED_OUTPATIENT_CLINIC_OR_DEPARTMENT_OTHER): Payer: Self-pay

## 2016-06-01 VITALS — BP 127/70 | HR 74 | Temp 97.7°F | Resp 18

## 2016-06-01 DIAGNOSIS — C8128 Mixed cellularity classical Hodgkin lymphoma, lymph nodes of multiple sites: Secondary | ICD-10-CM

## 2016-06-01 DIAGNOSIS — Z5189 Encounter for other specified aftercare: Secondary | ICD-10-CM

## 2016-06-01 MED ORDER — PEGFILGRASTIM INJECTION 6 MG/0.6ML ~~LOC~~
6.0000 mg | PREFILLED_SYRINGE | Freq: Once | SUBCUTANEOUS | Status: AC
  Administered 2016-06-01: 6 mg via SUBCUTANEOUS
  Filled 2016-06-01: qty 0.6

## 2016-06-01 NOTE — Patient Instructions (Signed)
Pegfilgrastim injection What is this medicine? PEGFILGRASTIM (PEG fil gra stim) is a long-acting granulocyte colony-stimulating factor that stimulates the growth of neutrophils, a type of white blood cell important in the body's fight against infection. It is used to reduce the incidence of fever and infection in patients with certain types of cancer who are receiving chemotherapy that affects the bone marrow, and to increase survival after being exposed to high doses of radiation. This medicine may be used for other purposes; ask your health care provider or pharmacist if you have questions. What should I tell my health care provider before I take this medicine? They need to know if you have any of these conditions: -kidney disease -latex allergy -ongoing radiation therapy -sickle cell disease -skin reactions to acrylic adhesives (On-Body Injector only) -an unusual or allergic reaction to pegfilgrastim, filgrastim, other medicines, foods, dyes, or preservatives -pregnant or trying to get pregnant -breast-feeding How should I use this medicine? This medicine is for injection under the skin. If you get this medicine at home, you will be taught how to prepare and give the pre-filled syringe or how to use the On-body Injector. Refer to the patient Instructions for Use for detailed instructions. Use exactly as directed. Take your medicine at regular intervals. Do not take your medicine more often than directed. It is important that you put your used needles and syringes in a special sharps container. Do not put them in a trash can. If you do not have a sharps container, call your pharmacist or healthcare provider to get one. Talk to your pediatrician regarding the use of this medicine in children. While this drug may be prescribed for selected conditions, precautions do apply. Overdosage: If you think you have taken too much of this medicine contact a poison control center or emergency room at  once. NOTE: This medicine is only for you. Do not share this medicine with others. What if I miss a dose? It is important not to miss your dose. Call your doctor or health care professional if you miss your dose. If you miss a dose due to an On-body Injector failure or leakage, a new dose should be administered as soon as possible using a single prefilled syringe for manual use. What may interact with this medicine? Interactions have not been studied. Give your health care provider a list of all the medicines, herbs, non-prescription drugs, or dietary supplements you use. Also tell them if you smoke, drink alcohol, or use illegal drugs. Some items may interact with your medicine. This list may not describe all possible interactions. Give your health care provider a list of all the medicines, herbs, non-prescription drugs, or dietary supplements you use. Also tell them if you smoke, drink alcohol, or use illegal drugs. Some items may interact with your medicine. What should I watch for while using this medicine? You may need blood work done while you are taking this medicine. If you are going to need a MRI, CT scan, or other procedure, tell your doctor that you are using this medicine (On-Body Injector only). What side effects may I notice from receiving this medicine? Side effects that you should report to your doctor or health care professional as soon as possible: -allergic reactions like skin rash, itching or hives, swelling of the face, lips, or tongue -dizziness -fever -pain, redness, or irritation at site where injected -pinpoint red spots on the skin -red or dark-brown urine -shortness of breath or breathing problems -stomach or side pain, or pain   at the shoulder -swelling -tiredness -trouble passing urine or change in the amount of urine Side effects that usually do not require medical attention (report to your doctor or health care professional if they continue or are  bothersome): -bone pain -muscle pain This list may not describe all possible side effects. Call your doctor for medical advice about side effects. You may report side effects to FDA at 1-800-FDA-1088. Where should I keep my medicine? Keep out of the reach of children. Store pre-filled syringes in a refrigerator between 2 and 8 degrees C (36 and 46 degrees F). Do not freeze. Keep in carton to protect from light. Throw away this medicine if it is left out of the refrigerator for more than 48 hours. Throw away any unused medicine after the expiration date. NOTE: This sheet is a summary. It may not cover all possible information. If you have questions about this medicine, talk to your doctor, pharmacist, or health care provider.    2016, Elsevier/Gold Standard. (2015-01-02 14:30:14)  

## 2016-06-04 ENCOUNTER — Inpatient Hospital Stay (HOSPITAL_COMMUNITY): Admission: RE | Admit: 2016-06-04 | Payer: Self-pay | Source: Ambulatory Visit

## 2016-06-04 DIAGNOSIS — L819 Disorder of pigmentation, unspecified: Secondary | ICD-10-CM | POA: Insufficient documentation

## 2016-06-04 NOTE — Progress Notes (Signed)
[name]  HEMATOLOGY ONCOLOGY PROGRESS NOTE  Date of service: 05/31/2016  Patient Care Team: Maren Reamer, MD as PCP - General (Internal Medicine)  Chief complaint: Follow-up for Hodgkin's lymphoma  Diagnosis: Mixed cellularity Hodgkin's lymphoma IVBE with extensive lymphadenopathy including right axillary, mediastinal and upper retroperitoneal and now biopsy proven pulmonary involvement. She was noted to have significant constitutional symptoms including significant weight loss, fevers chills and some night sweats.  Current Treatment:  Getting cycle 2 cycle of AVD (without bleomycin due to lung involvement)  INTERVAL HISTORY:  Heidi [name] is here for follow-up of her Hodgkin's lymphoma prior to her cycle 2 D15 of AVD. She notes that her breathing has gotten better. Still smoking a few cigarettes a day which I strongly counseled her against. No fevers no chills no night sweats. Has been gaining back weight. Overall feels well. Notes some hyperpigmentation over both her feet with no significant pruritus or skin breakdown. She is going to be getting her PFT's to determine if we can use bleomycin with cycle 3. Accompanied by one of her family members.  REVIEW OF SYSTEMS:    10 Point review of systems of done and is negative except as noted above.  . Past Medical History  Diagnosis Date  . Asthma   . Hodgkin lymphoma ([place])   . Hypertension     . Past Surgical History  Procedure Laterality Date  . Axillary lymph node biopsy Right 03/19/2016    Procedure: AXILLARY LYMPH NODE BIOPSY;  Surgeon: Armandina Gemma, MD;  Location: [facility];  Service: General;  Laterality: Right;    . Social History  Substance Use Topics  . Smoking status: Former Smoker -- 0.50 packs/day for 15 years    Types: Cigarettes    Quit date: 03/27/2016  . Smokeless tobacco: Never Used  . Alcohol Use: Yes     Comment: occasional now    ALLERGIES:  has No Known Allergies.  MEDICATIONS:  Current Outpatient  Prescriptions  Medication Sig Dispense Refill  . dexamethasone (DECADRON) 4 MG tablet 2 tab (8mg ) twice a day for 3 days starting on day of chemotherapy 30 tablet 3  . guaiFENesin (MUCINEX) 600 MG 12 hr tablet Take 1,200 mg by mouth 2 (two) times daily as needed for cough or to loosen phlegm.    . labetalol (NORMODYNE) 100 MG tablet TK 1 T PO  BID  6  . levalbuterol (XOPENEX HFA) 45 MCG/ACT inhaler Inhale 2 puffs into the lungs every 6 (six) hours as needed for wheezing. 1 Inhaler 12  . lidocaine-prilocaine (EMLA) cream Apply 1 application topically as needed.    . loratadine (CLARITIN) 10 MG tablet Take 1 tablet (10 mg total) by mouth daily. For up to 5 days following Neulasta injection. 5 tablet 2  . metoprolol (LOPRESSOR) 50 MG tablet Take 1 tablet (50 mg total) by mouth 2 (two) times daily. 60 tablet 0  . ondansetron (ZOFRAN) 8 MG tablet Take 1 tablet (8 mg total) by mouth every 8 (eight) hours as needed for nausea (start on the 3rd day after chemotherapy). 30 tablet 3  . prochlorperazine (COMPAZINE) 10 MG tablet Take 1 tablet (10 mg total) by mouth every 6 (six) hours as needed for nausea or vomiting. 30 tablet 3   No current facility-administered medications for this visit.    PHYSICAL EXAMINATION: ECOG PERFORMANCE STATUS: 1 - Symptomatic but completely ambulatory  . Filed Vitals:   05/31/16 1128  BP: 125/82  Pulse: 74  Temp: 97.8 F (36.6  C)  Resp: 17    Filed Weights   05/31/16 1128  Weight: 141 lb 14.4 oz (64.365 kg)   .Body mass index is 22.91 kg/(m^2).  OROPHARYNX: Moist mucous membranes No thrush NECK: supple, no JVD LYMPH: Small cervical and axillary lymphadenopathy Resolving LUNGS:  improved air entry and with decreased bilateral rhonchi HEART: regular rate & rhythm ABDOMEN: abdomen soft, nontender, normoactive bowel sounds Extremities no edema  NEURO: Awake alert and oriented 3, moving all 4 extremities. No overt focal neurological deficit.  LABORATORY  DATA:   I have reviewed the data as listed  . CBC Latest Ref Rng 05/31/2016 05/17/2016 05/14/2016  WBC 3.9 - 10.3 10e3/uL 5.3 6.1 12.8(H)  Hemoglobin 11.6 - 15.9 g/dL 12.1 10.9(L) 11.7  Hematocrit 34.8 - 46.6 % 36.9 34.3(L) 36.9  Platelets 145 - 400 10e3/uL 225 231 318    . CMP Latest Ref Rng 05/31/2016 05/17/2016 05/14/2016  Glucose 70 - 140 mg/dl 82 113(H) 84  BUN 7.0 - 26.0 mg/dL 7.6 11 11.2  Creatinine 0.6 - 1.1 mg/dL 0.7 0.50 0.7  Sodium 136 - 145 mEq/L 141 138 143  Potassium 3.5 - 5.1 mEq/L 4.5 3.9 5.1  Chloride 101 - 111 mmol/L - 104 -  CO2 22 - 29 mEq/L 27 28 29   Calcium 8.4 - 10.4 mg/dL 9.5 9.2 9.8  Total Protein 6.4 - 8.3 g/dL 6.8 - 6.7  Total Bilirubin 0.20 - 1.20 mg/dL 0.44 - <0.30  Alkaline Phos 40 - 150 U/L 104 - 167(H)  AST 5 - 34 U/L 11 - 10  ALT 0 - 55 U/L <9 - 11     RADIOGRAPHIC STUDIES: I have personally reviewed the radiological images as listed and agreed with the findings in the report. Nm Pet Image Initial (pi) Skull Base To Thigh  05/12/2016  CLINICAL DATA:  Initial treatment strategy for staging of mixed cellularity Hodgkin's lymphoma. EXAM: NUCLEAR MEDICINE PET SKULL BASE TO THIGH TECHNIQUE: 6.2 mCi F-18 FDG was injected intravenously. Full-ring PET imaging was performed from the skull base to thigh after the radiotracer. CT data was obtained and used for attenuation correction and anatomic localization. FASTING BLOOD GLUCOSE:  Value: 91 mg/dl COMPARISON:  Abdominal pelvic CT of 04/16/2016. Chest CT 04/10/2016. FINDINGS: NECK Symmetric hypermetabolism within the bilateral parotid and submandibular glands, favored to be physiologic. Right-sided small cervical nodes which are hypermetabolic. Right level 2 jugular node which measures 9 mm and a S.U.V. max of 2.6 on image 22/series 4. CHEST Hypermetabolic right axillary and subpectoral adenopathy. An index right axillary node measures 1.0 cm and a S.U.V. max of 8.1 on image 56/series 4. Hypermetabolic right worse than  left areas of pulmonary consolidation. The periphery of these areas is somewhat nodular. When comparing back to the most remote chest CT of 03/16/2016, the left upper lobe aeration is markedly improved. ABDOMEN/PELVIS Retroperitoneal hypermetabolic adenopathy, including an index pre caval node which measures 1.4 cm and a S.U.V. max of 6.3 on image 134/series 4. No pelvic nodal hypermetabolism. SKELETON Mild, minimally heterogeneous marrow hypermetabolism relatively diffusely. CT IMAGES PERFORMED FOR ATTENUATION CORRECTION No further findings within the neck. Mild cardiomegaly, accentuated by a pectus excavatum deformity. Worsened right upper lobe aeration since 04/10/2016. Right lower and left upper lobe aeration is improved. Probable hepatomegaly. Healing seventh right posterior lateral rib fracture. IMPRESSION: 1. Active lymphoma within the nodal stations of the neck, chest, and abdomen, as detailed above. 2. Right worse than left hypermetabolic pulmonary consolidation, some of which is relatively nodular. Although  pulmonary lymphoma cannot be excluded, the areas of progressive and regressive disease since 04/10/2016 suggests at least a component of this process represents infection. 3. Nonspecific mild marrow hypermetabolism. Favored to be physiologic. Electronically Signed   By: Abigail Miyamoto M.D.   On: 05/12/2016 12:45   Ir Fluoro Guide Cv Line Right  05/17/2016  CLINICAL DATA:  Lymphoma, needs long-term venous access for chemotherapy. EXAM: TUNNELED PORT CATHETER PLACEMENT WITH ULTRASOUND AND FLUOROSCOPIC GUIDANCE FLUOROSCOPY TIME:  0.4 minutes, 2 mGy ANESTHESIA/SEDATION: Intravenous Fentanyl and Versed were administered as conscious sedation during continuous monitoring of the patient's level of consciousness and physiological / cardiorespiratory status by the radiology RN, with a total moderate sedation time of 17 minutes. TECHNIQUE: The procedure, risks, benefits, and alternatives were explained to the  patient. Questions regarding the procedure were encouraged and answered. The patient understands and consents to the procedure. As antibiotic prophylaxis, cefazolin 2 g was ordered pre-procedure and administered intravenously within one hour of incision. Patency of the right IJ vein was confirmed with ultrasound with image documentation. An appropriate skin site was determined. Skin site was marked. Region was prepped using maximum barrier technique including cap and mask, sterile gown, sterile gloves, large sterile sheet, and Chlorhexidine as cutaneous antisepsis. The region was infiltrated locally with 1% lidocaine.sedationUnder real-time ultrasound guidance, the right IJ vein was accessed with a 21 gauge micropuncture needle; the needle tip within the vein was confirmed with ultrasound image documentation. Needle was exchanged over a 018 guidewire for transitional dilator which allowed passage of the [facility] wire into the IVC. Over this, the transitional dilator was exchanged for a 5 [place] MPA catheter. A small incision was made on the right anterior chest wall and a subcutaneous pocket fashioned. The power-injectable port was positioned and its catheter tunneled to the right IJ dermatotomy site. The MPA catheter was exchanged over an Amplatz wire for a peel-away sheath, through which the port catheter, which had been trimmed to the appropriate length, was advanced and positioned under fluoroscopy with its tip at the cavoatrial junction. Spot chest radiograph confirms good catheter position and no pneumothorax. The pocket was closed with deep interrupted and subcuticular continuous 3-0 Monocryl sutures. The port was flushed per protocol. The incisions were covered with Dermabond then covered with a sterile dressing. COMPLICATIONS: COMPLICATIONS None immediate IMPRESSION: Technically successful right IJ power-injectable port catheter placement. Ready for routine use. Electronically Signed   By: Lucrezia Europe M.D.    On: 05/17/2016 12:22   Ir US Guide Vasc Access Right  05/17/2016  CLINICAL DATA:  Lymphoma, needs long-term venous access for chemotherapy. EXAM: TUNNELED PORT CATHETER PLACEMENT WITH ULTRASOUND AND FLUOROSCOPIC GUIDANCE FLUOROSCOPY TIME:  0.4 minutes, 2 mGy ANESTHESIA/SEDATION: Intravenous Fentanyl and Versed were administered as conscious sedation during continuous monitoring of the patient's level of consciousness and physiological / cardiorespiratory status by the radiology RN, with a total moderate sedation time of 17 minutes. TECHNIQUE: The procedure, risks, benefits, and alternatives were explained to the patient. Questions regarding the procedure were encouraged and answered. The patient understands and consents to the procedure. As antibiotic prophylaxis, cefazolin 2 g was ordered pre-procedure and administered intravenously within one hour of incision. Patency of the right IJ vein was confirmed with ultrasound with image documentation. An appropriate skin site was determined. Skin site was marked. Region was prepped using maximum barrier technique including cap and mask, sterile gown, sterile gloves, large sterile sheet, and Chlorhexidine as cutaneous antisepsis. The region was infiltrated locally with 1% lidocaine.sedationUnder real-time  ultrasound guidance, the right IJ vein was accessed with a 21 gauge micropuncture needle; the needle tip within the vein was confirmed with ultrasound image documentation. Needle was exchanged over a 018 guidewire for transitional dilator which allowed passage of the [facility] wire into the IVC. Over this, the transitional dilator was exchanged for a 5 [place] MPA catheter. A small incision was made on the right anterior chest wall and a subcutaneous pocket fashioned. The power-injectable port was positioned and its catheter tunneled to the right IJ dermatotomy site. The MPA catheter was exchanged over an Amplatz wire for a peel-away sheath, through which the port  catheter, which had been trimmed to the appropriate length, was advanced and positioned under fluoroscopy with its tip at the cavoatrial junction. Spot chest radiograph confirms good catheter position and no pneumothorax. The pocket was closed with deep interrupted and subcuticular continuous 3-0 Monocryl sutures. The port was flushed per protocol. The incisions were covered with Dermabond then covered with a sterile dressing. COMPLICATIONS: COMPLICATIONS None immediate IMPRESSION: Technically successful right IJ power-injectable port catheter placement. Ready for routine use. Electronically Signed   By: Lucrezia Europe M.D.   On: 05/17/2016 12:22    ASSESSMENT & PLAN:   30 year old African-American female with  #1 Mixed cellularity Hodgkin's lymphoma IV B E with extensive lymphadenopathy including right axillary, mediastinal and upper retroperitoneal and now biopsy proven pulmonary involvement. She was noted to have significant constitutional symptoms including significant weight loss, fevers chills and some night sweats. HIV negative Hepatitis C and hepatitis B serologies negative. Echo with normal ejection fraction. Patient is here for follow-up prior to her cycle 2 day 15 AVD (bleomycin on hold due to lung compromise)  #2 hypoxic respiratory failure with dense right lung consolidation and left upper lobe consolidation. Pulmonary biopsy +ve for CHL - improved. Currently off oxygen. Breathing continues to improve Appears to have some chronic hypercapnia.  #3 ABdominal distension -much  improved. Has retroperitoneal and mesenteric adenopathy from her Hodgkin's lymphoma. Plan Patient is doing well with no prohibitive toxicities. Labs currently stable. -She is appropriate to proceed with C2 D15  AVD ( without bleomycin) with Neulasta support given 2 recent hospitalizations for possible pneumonia. -PFTs to evaluate lung function to determine if we can add bleomycin from cycle 3. -She was counseled on  absolute smoking cessation   Return to care with Dr. Irene Limbo in 2 weeks with CBC CMP prior to cycle 3 D1  I spent 20 minutes counseling the patient face to face. The total time spent in the appointment was 25 minutes and more than 50% was on counseling and direct patient cares.    Sullivan Lone MD [place] [facility]IV[facility] Hematology/Oncology Physician [facility]  (Office):       [phone] (Work cell):  [phone] (Fax):           [phone]

## 2016-06-09 ENCOUNTER — Telehealth: Payer: Self-pay | Admitting: Hematology

## 2016-06-09 NOTE — Telephone Encounter (Signed)
cld & spoke to pt and gave her appt for 6/20 r/s by Dr Margarito Liner appt and inf

## 2016-06-14 ENCOUNTER — Ambulatory Visit: Payer: Self-pay | Admitting: Hematology

## 2016-06-14 ENCOUNTER — Encounter (HOSPITAL_COMMUNITY): Payer: Self-pay

## 2016-06-14 ENCOUNTER — Other Ambulatory Visit: Payer: Self-pay

## 2016-06-15 ENCOUNTER — Encounter: Payer: Self-pay | Admitting: Hematology

## 2016-06-15 ENCOUNTER — Other Ambulatory Visit: Payer: Self-pay | Admitting: *Deleted

## 2016-06-15 ENCOUNTER — Telehealth: Payer: Self-pay | Admitting: Hematology

## 2016-06-15 ENCOUNTER — Ambulatory Visit (HOSPITAL_BASED_OUTPATIENT_CLINIC_OR_DEPARTMENT_OTHER): Payer: Self-pay

## 2016-06-15 ENCOUNTER — Other Ambulatory Visit (HOSPITAL_BASED_OUTPATIENT_CLINIC_OR_DEPARTMENT_OTHER): Payer: Self-pay

## 2016-06-15 ENCOUNTER — Ambulatory Visit (HOSPITAL_BASED_OUTPATIENT_CLINIC_OR_DEPARTMENT_OTHER): Payer: Self-pay | Admitting: Hematology

## 2016-06-15 VITALS — BP 113/79 | HR 79 | Temp 98.2°F | Resp 18 | Ht 66.0 in | Wt 149.6 lb

## 2016-06-15 DIAGNOSIS — J9691 Respiratory failure, unspecified with hypoxia: Secondary | ICD-10-CM

## 2016-06-15 DIAGNOSIS — C8128 Mixed cellularity classical Hodgkin lymphoma, lymph nodes of multiple sites: Secondary | ICD-10-CM

## 2016-06-15 DIAGNOSIS — Z5111 Encounter for antineoplastic chemotherapy: Secondary | ICD-10-CM

## 2016-06-15 LAB — CBC WITH DIFFERENTIAL/PLATELET
BASO%: 1 % (ref 0.0–2.0)
BASOS ABS: 0 10*3/uL (ref 0.0–0.1)
EOS ABS: 0.1 10*3/uL (ref 0.0–0.5)
EOS%: 2.3 % (ref 0.0–7.0)
HCT: 38.5 % (ref 34.8–46.6)
HEMOGLOBIN: 12.7 g/dL (ref 11.6–15.9)
LYMPH%: 6.7 % — AB (ref 14.0–49.7)
MCH: 30.8 pg (ref 25.1–34.0)
MCHC: 33 g/dL (ref 31.5–36.0)
MCV: 93.3 fL (ref 79.5–101.0)
MONO#: 0.5 10*3/uL (ref 0.1–0.9)
MONO%: 10.8 % (ref 0.0–14.0)
NEUT%: 79.2 % — ABNORMAL HIGH (ref 38.4–76.8)
NEUTROS ABS: 3.5 10*3/uL (ref 1.5–6.5)
PLATELETS: 222 10*3/uL (ref 145–400)
RBC: 4.13 10*6/uL (ref 3.70–5.45)
RDW: 18.9 % — AB (ref 11.2–14.5)
WBC: 4.4 10*3/uL (ref 3.9–10.3)
lymph#: 0.3 10*3/uL — ABNORMAL LOW (ref 0.9–3.3)

## 2016-06-15 LAB — COMPREHENSIVE METABOLIC PANEL
AST: 14 U/L (ref 5–34)
Albumin: 3.6 g/dL (ref 3.5–5.0)
Alkaline Phosphatase: 112 U/L (ref 40–150)
Anion Gap: 8 mEq/L (ref 3–11)
BILIRUBIN TOTAL: 0.42 mg/dL (ref 0.20–1.20)
BUN: 13 mg/dL (ref 7.0–26.0)
CO2: 26 meq/L (ref 22–29)
CREATININE: 0.7 mg/dL (ref 0.6–1.1)
Calcium: 9.4 mg/dL (ref 8.4–10.4)
Chloride: 105 mEq/L (ref 98–109)
EGFR: >90 mL/min/{1.73_m2} (ref >90)
GLUCOSE: 113 mg/dL (ref 70–140)
Potassium: 4.7 mEq/L (ref 3.5–5.1)
SODIUM: 139 meq/L (ref 136–145)
TOTAL PROTEIN: 6.9 g/dL (ref 6.4–8.3)

## 2016-06-15 MED ORDER — FOSAPREPITANT DIMEGLUMINE INJECTION 150 MG
Freq: Once | INTRAVENOUS | Status: AC
  Administered 2016-06-15: 12:00:00 via INTRAVENOUS
  Filled 2016-06-15: qty 5

## 2016-06-15 MED ORDER — HEPARIN SOD (PORK) LOCK FLUSH 100 UNIT/ML IV SOLN
500.0000 [IU] | Freq: Once | INTRAVENOUS | Status: AC | PRN
  Administered 2016-06-15: 500 [IU]
  Filled 2016-06-15: qty 5

## 2016-06-15 MED ORDER — DOXORUBICIN HCL CHEMO IV INJECTION 2 MG/ML
25.0000 mg/m2 | Freq: Once | INTRAVENOUS | Status: AC
  Administered 2016-06-15: 44 mg via INTRAVENOUS
  Filled 2016-06-15: qty 22

## 2016-06-15 MED ORDER — PALONOSETRON HCL INJECTION 0.25 MG/5ML
0.2500 mg | Freq: Once | INTRAVENOUS | Status: AC
  Administered 2016-06-15: 0.25 mg via INTRAVENOUS

## 2016-06-15 MED ORDER — DACARBAZINE 200 MG IV SOLR
375.0000 mg/m2 | Freq: Once | INTRAVENOUS | Status: AC
  Administered 2016-06-15: 700 mg via INTRAVENOUS
  Filled 2016-06-15: qty 5

## 2016-06-15 MED ORDER — PALONOSETRON HCL INJECTION 0.25 MG/5ML
INTRAVENOUS | Status: AC
  Filled 2016-06-15: qty 5

## 2016-06-15 MED ORDER — LORATADINE 10 MG PO TABS: 10.0000 mg | ORAL_TABLET | Freq: Every day | ORAL | Status: DC

## 2016-06-15 MED ORDER — DEXAMETHASONE 4 MG PO TABS: ORAL_TABLET | ORAL | Status: DC

## 2016-06-15 MED ORDER — SODIUM CHLORIDE 0.9% FLUSH
10.0000 mL | INTRAVENOUS | Status: DC | PRN
  Administered 2016-06-15: 10 mL
  Filled 2016-06-15: qty 10

## 2016-06-15 MED ORDER — SODIUM CHLORIDE 0.9 % IV SOLN
Freq: Once | INTRAVENOUS | Status: AC
  Administered 2016-06-15: 12:00:00 via INTRAVENOUS

## 2016-06-15 MED ORDER — VINBLASTINE SULFATE CHEMO INJECTION 1 MG/ML
5.9700 mg/m2 | Freq: Once | INTRAVENOUS | Status: AC
  Administered 2016-06-15: 11 mg via INTRAVENOUS
  Filled 2016-06-15: qty 11

## 2016-06-15 MED FILL — DEXAMETHASONE 4 MG TABLET: 4 | 6 days supply | Qty: 30 | Fill #0

## 2016-06-15 MED FILL — LORATADINE 10 MG TABLET: 10 | 5 days supply | Qty: 5 | Fill #0

## 2016-06-15 NOTE — Patient Instructions (Signed)
[facility] Discharge Instructions for Patients Receiving Chemotherapy  Today you received the following chemotherapy agents Adriamycin, Velban and Dacarbazine.  To help prevent nausea and vomiting after your treatment, we encourage you to take your nausea medication as prescribed    If you develop nausea and vomiting that is not controlled by your nausea medication, call the clinic.   BELOW ARE SYMPTOMS THAT SHOULD BE REPORTED IMMEDIATELY:  *FEVER GREATER THAN 100.5 F  *CHILLS WITH OR WITHOUT FEVER  NAUSEA AND VOMITING THAT IS NOT CONTROLLED WITH YOUR NAUSEA MEDICATION  *UNUSUAL SHORTNESS OF BREATH  *UNUSUAL BRUISING OR BLEEDING  TENDERNESS IN MOUTH AND THROAT WITH OR WITHOUT PRESENCE OF ULCERS  *URINARY PROBLEMS  *BOWEL PROBLEMS  UNUSUAL RASH Items with * indicate a potential emergency and should be followed up as soon as possible.  Feel free to call the clinic you have any questions or concerns. The clinic phone number is [phone].  Please show the [facility] at check-in to the Emergency Department and triage nurse.

## 2016-06-15 NOTE — Telephone Encounter (Signed)
Gave and printed appt sched and avs fo rpt for June and JUly  °

## 2016-06-15 NOTE — Progress Notes (Signed)
[name]  HEMATOLOGY ONCOLOGY PROGRESS NOTE  Date of service:   Patient Care Team: Heidi Reamer, MD as PCP - General (Internal Medicine)  Chief complaint: Follow-up for Hodgkin's lymphoma  Diagnosis: Mixed cellularity Hodgkin's lymphoma IVBE with extensive lymphadenopathy including right axillary, mediastinal and upper retroperitoneal and now biopsy proven pulmonary involvement. She was noted to have significant constitutional symptoms including significant weight loss, fevers chills and some night sweats.  Current Treatment:  Getting cycle 3 cycle of AVD (without bleomycin due to lung involvement and DLCO of 37%)  INTERVAL HISTORY:  Heidi [name] is here for follow-up of her Hodgkin's lymphoma prior to her cycle 3 D1 of AVD. She notes that she continues to feel better and her breathing has significantly improved. No cough or overt shortness of breath. Had pulmonary function tests that showed significantly reduced DLCO and therefore we shall continue to hold her bleomycin. No fevers or chills no night sweats. Has been gaining weight and feels that she is pretty close to her baseline weight again. No other acute new symptoms. No tingling or numbness in her hands or feet. We discussed smoking cessation in details again. She'll still smoking a few cigarettes a day. She was given information to the [place] smoking quit line.  REVIEW OF SYSTEMS:    10 Point review of systems of done and is negative except as noted above.  . Past Medical History  Diagnosis Date  . Asthma   . Hodgkin lymphoma ([place])   . Hypertension     . Past Surgical History  Procedure Laterality Date  . Axillary lymph node biopsy Right 03/19/2016    Procedure: AXILLARY LYMPH NODE BIOPSY;  Surgeon: Armandina Gemma, MD;  Location: [facility];  Service: General;  Laterality: Right;    . Social History  Substance Use Topics  . Smoking status: Former Smoker -- 0.50 packs/day for 15 years    Types: Cigarettes    Quit  date: 03/27/2016  . Smokeless tobacco: Never Used  . Alcohol Use: Yes     Comment: occasional now    ALLERGIES:  has No Known Allergies.  MEDICATIONS:  Current Outpatient Prescriptions  Medication Sig Dispense Refill  . dexamethasone (DECADRON) 4 MG tablet 2 tab (8mg ) twice a day for 3 days starting on day of chemotherapy 30 tablet 3  . guaiFENesin (MUCINEX) 600 MG 12 hr tablet Take 1,200 mg by mouth 2 (two) times daily as needed for cough or to loosen phlegm.    . labetalol (NORMODYNE) 100 MG tablet TK 1 T PO  BID  6  . levalbuterol (XOPENEX HFA) 45 MCG/ACT inhaler Inhale 2 puffs into the lungs every 6 (six) hours as needed for wheezing. 1 Inhaler 12  . lidocaine-prilocaine (EMLA) cream Apply 1 application topically as needed.    . loratadine (CLARITIN) 10 MG tablet Take 1 tablet (10 mg total) by mouth daily. For up to 5 days following Neulasta injection. 5 tablet 2  . metoprolol (LOPRESSOR) 50 MG tablet Take 1 tablet (50 mg total) by mouth 2 (two) times daily. 60 tablet 0  . ondansetron (ZOFRAN) 8 MG tablet Take 1 tablet (8 mg total) by mouth every 8 (eight) hours as needed for nausea (start on the 3rd day after chemotherapy). 30 tablet 3  . prochlorperazine (COMPAZINE) 10 MG tablet Take 1 tablet (10 mg total) by mouth every 6 (six) hours as needed for nausea or vomiting. 30 tablet 3   No current facility-administered medications for this visit.  PHYSICAL EXAMINATION: ECOG PERFORMANCE STATUS: 1 - Symptomatic but completely ambulatory  . Filed Vitals:   06/15/16 1048  BP: 113/79  Pulse: 79  Temp: 98.2 F (36.8 C)  Resp: 18    Filed Weights   06/15/16 1048  Weight: 149 lb 9.6 oz (67.858 kg)   .Body mass index is 24.16 kg/(m^2).  OROPHARYNX: Moist mucous membranes No thrush NECK: supple, no JVD LYMPH: Small cervical and axillary lymphadenopathy Resolving LUNGS:  improved air entry and with decreased bilateral rhonchi HEART: regular rate & rhythm ABDOMEN: abdomen  soft, nontender, normoactive bowel sounds Extremities no edema  NEURO: Awake alert and oriented 3, moving all 4 extremities. No overt focal neurological deficit.  LABORATORY DATA:   I have reviewed the data as listed  . CBC Latest Ref Rng 06/15/2016 05/31/2016 05/17/2016  WBC 3.9 - 10.3 10e3/uL 4.4 5.3 6.1  Hemoglobin 11.6 - 15.9 g/dL 12.7 12.1 10.9(L)  Hematocrit 34.8 - 46.6 % 38.5 36.9 34.3(L)  Platelets 145 - 400 10e3/uL 222 225 231    . CMP Latest Ref Rng 06/15/2016 05/31/2016 05/17/2016  Glucose 70 - 140 mg/dl 113 82 113(H)  BUN 7.0 - 26.0 mg/dL 13.0 7.6 11  Creatinine 0.6 - 1.1 mg/dL 0.7 0.7 0.50  Sodium 136 - 145 mEq/L 139 141 138  Potassium 3.5 - 5.1 mEq/L 4.7 4.5 3.9  Chloride 101 - 111 mmol/L - - 104  CO2 22 - 29 mEq/L 26 27 28   Calcium 8.4 - 10.4 mg/dL 9.4 9.5 9.2  Total Protein 6.4 - 8.3 g/dL 6.9 6.8 -  Total Bilirubin 0.20 - 1.20 mg/dL 0.42 0.44 -  Alkaline Phos 40 - 150 U/L 112 104 -  AST 5 - 34 U/L 14 11 -  ALT 0 - 55 U/L <9 <9 -    RADIOGRAPHIC STUDIES: I have personally reviewed the radiological images as listed and agreed with the findings in the report. Ir Fluoro Guide Cv Line Right  05/17/2016  CLINICAL DATA:  Lymphoma, needs long-term venous access for chemotherapy. EXAM: TUNNELED PORT CATHETER PLACEMENT WITH ULTRASOUND AND FLUOROSCOPIC GUIDANCE FLUOROSCOPY TIME:  0.4 minutes, 2 mGy ANESTHESIA/SEDATION: Intravenous Fentanyl and Versed were administered as conscious sedation during continuous monitoring of the patient's level of consciousness and physiological / cardiorespiratory status by the radiology RN, with a total moderate sedation time of 17 minutes. TECHNIQUE: The procedure, risks, benefits, and alternatives were explained to the patient. Questions regarding the procedure were encouraged and answered. The patient understands and consents to the procedure. As antibiotic prophylaxis, cefazolin 2 g was ordered pre-procedure and administered intravenously within  one hour of incision. Patency of the right IJ vein was confirmed with ultrasound with image documentation. An appropriate skin site was determined. Skin site was marked. Region was prepped using maximum barrier technique including cap and mask, sterile gown, sterile gloves, large sterile sheet, and Chlorhexidine as cutaneous antisepsis. The region was infiltrated locally with 1% lidocaine.sedationUnder real-time ultrasound guidance, the right IJ vein was accessed with a 21 gauge micropuncture needle; the needle tip within the vein was confirmed with ultrasound image documentation. Needle was exchanged over a 018 guidewire for transitional dilator which allowed passage of the [facility] wire into the IVC. Over this, the transitional dilator was exchanged for a 5 [place] MPA catheter. A small incision was made on the right anterior chest wall and a subcutaneous pocket fashioned. The power-injectable port was positioned and its catheter tunneled to the right IJ dermatotomy site. The MPA catheter was exchanged over an  Amplatz wire for a peel-away sheath, through which the port catheter, which had been trimmed to the appropriate length, was advanced and positioned under fluoroscopy with its tip at the cavoatrial junction. Spot chest radiograph confirms good catheter position and no pneumothorax. The pocket was closed with deep interrupted and subcuticular continuous 3-0 Monocryl sutures. The port was flushed per protocol. The incisions were covered with Dermabond then covered with a sterile dressing. COMPLICATIONS: COMPLICATIONS None immediate IMPRESSION: Technically successful right IJ power-injectable port catheter placement. Ready for routine use. Electronically Signed   By: Heidi [name] M.D.   On: 05/17/2016 12:22   Ir US Guide Vasc Access Right  05/17/2016  CLINICAL DATA:  Lymphoma, needs long-term venous access for chemotherapy. EXAM: TUNNELED PORT CATHETER PLACEMENT WITH ULTRASOUND AND FLUOROSCOPIC GUIDANCE  FLUOROSCOPY TIME:  0.4 minutes, 2 mGy ANESTHESIA/SEDATION: Intravenous Fentanyl and Versed were administered as conscious sedation during continuous monitoring of the patient's level of consciousness and physiological / cardiorespiratory status by the radiology RN, with a total moderate sedation time of 17 minutes. TECHNIQUE: The procedure, risks, benefits, and alternatives were explained to the patient. Questions regarding the procedure were encouraged and answered. The patient understands and consents to the procedure. As antibiotic prophylaxis, cefazolin 2 g was ordered pre-procedure and administered intravenously within one hour of incision. Patency of the right IJ vein was confirmed with ultrasound with image documentation. An appropriate skin site was determined. Skin site was marked. Region was prepped using maximum barrier technique including cap and mask, sterile gown, sterile gloves, large sterile sheet, and Chlorhexidine as cutaneous antisepsis. The region was infiltrated locally with 1% lidocaine.sedationUnder real-time ultrasound guidance, the right IJ vein was accessed with a 21 gauge micropuncture needle; the needle tip within the vein was confirmed with ultrasound image documentation. Needle was exchanged over a 018 guidewire for transitional dilator which allowed passage of the [facility] wire into the IVC. Over this, the transitional dilator was exchanged for a 5 [place] MPA catheter. A small incision was made on the right anterior chest wall and a subcutaneous pocket fashioned. The power-injectable port was positioned and its catheter tunneled to the right IJ dermatotomy site. The MPA catheter was exchanged over an Amplatz wire for a peel-away sheath, through which the port catheter, which had been trimmed to the appropriate length, was advanced and positioned under fluoroscopy with its tip at the cavoatrial junction. Spot chest radiograph confirms good catheter position and no pneumothorax. The pocket  was closed with deep interrupted and subcuticular continuous 3-0 Monocryl sutures. The port was flushed per protocol. The incisions were covered with Dermabond then covered with a sterile dressing. COMPLICATIONS: COMPLICATIONS None immediate IMPRESSION: Technically successful right IJ power-injectable port catheter placement. Ready for routine use. Electronically Signed   By: Heidi [name] M.D.   On: 05/17/2016 12:22    ASSESSMENT & PLAN:   30 year old African-American female with  #1 Mixed cellularity Hodgkin's lymphoma IV B E with extensive lymphadenopathy including right axillary, mediastinal and upper retroperitoneal and now biopsy proven pulmonary involvement. She was noted to have significant constitutional symptoms including significant weight loss, fevers chills and some night sweats. HIV negative Hepatitis C and hepatitis B serologies negative. Echo with normal ejection fraction. Patient is here for follow-up prior to her cycle 3 day 1 AVD (bleomycin on hold due to lung compromise) DLCO on her pulmonary function tests are 37 percent suggesting severe reduction.  #2 hypoxic respiratory failure with dense right lung consolidation and left upper lobe consolidation. Pulmonary biopsy +  ve for CHL - improved. Currently off oxygen. Breathing continues to improve Appears to have some chronic hypercapnia.  #3 ABdominal distension -resolved Plan Patient is doing well with no prohibitive toxicities. Labs currently stable. -She is appropriate to proceed with C3  AVD ( without bleomycin) with Neulasta support given 2 recent hospitalizations for possible pneumonia. -Since the patient is still smoking and the pulmonary function testing shows severe reduction in DLCO we shall continue to hold her bleomycin at this time. -She was again counseled regarding absolute smoking cessation and provided the [place] smoking quit line number   Return to care with Dr. Irene Limbo in 4 weeks with CBC CMP prior to cycle 4 D1 with  labs and repeat PET/CT scan.  I spent 20 minutes counseling the patient face to face. The total time spent in the appointment was 25 minutes and more than 50% was on counseling and direct patient cares.    Sullivan Lone MD [place] [facility] Hematology/Oncology Physician [facility]  (Office):       [phone] (Work cell):  [phone] (Fax):           [phone]

## 2016-06-16 ENCOUNTER — Ambulatory Visit: Payer: Self-pay

## 2016-06-17 ENCOUNTER — Telehealth: Payer: Self-pay | Admitting: Hematology

## 2016-06-17 ENCOUNTER — Ambulatory Visit (HOSPITAL_BASED_OUTPATIENT_CLINIC_OR_DEPARTMENT_OTHER): Payer: Self-pay

## 2016-06-17 VITALS — BP 130/86 | HR 66 | Temp 98.4°F | Resp 18

## 2016-06-17 DIAGNOSIS — C8128 Mixed cellularity classical Hodgkin lymphoma, lymph nodes of multiple sites: Secondary | ICD-10-CM

## 2016-06-17 DIAGNOSIS — Z5189 Encounter for other specified aftercare: Secondary | ICD-10-CM

## 2016-06-17 MED ORDER — PEGFILGRASTIM INJECTION 6 MG/0.6ML ~~LOC~~
6.0000 mg | PREFILLED_SYRINGE | Freq: Once | SUBCUTANEOUS | Status: AC
  Administered 2016-06-17: 6 mg via SUBCUTANEOUS
  Filled 2016-06-17: qty 0.6

## 2016-06-17 NOTE — Patient Instructions (Signed)
Pegfilgrastim injection What is this medicine? PEGFILGRASTIM (PEG fil gra stim) is a long-acting granulocyte colony-stimulating factor that stimulates the growth of neutrophils, a type of white blood cell important in the body's fight against infection. It is used to reduce the incidence of fever and infection in patients with certain types of cancer who are receiving chemotherapy that affects the bone marrow, and to increase survival after being exposed to high doses of radiation. This medicine may be used for other purposes; ask your health care provider or pharmacist if you have questions. What should I tell my health care provider before I take this medicine? They need to know if you have any of these conditions: -kidney disease -latex allergy -ongoing radiation therapy -sickle cell disease -skin reactions to acrylic adhesives (On-Body Injector only) -an unusual or allergic reaction to pegfilgrastim, filgrastim, other medicines, foods, dyes, or preservatives -pregnant or trying to get pregnant -breast-feeding How should I use this medicine? This medicine is for injection under the skin. If you get this medicine at home, you will be taught how to prepare and give the pre-filled syringe or how to use the On-body Injector. Refer to the patient Instructions for Use for detailed instructions. Use exactly as directed. Take your medicine at regular intervals. Do not take your medicine more often than directed. It is important that you put your used needles and syringes in a special sharps container. Do not put them in a trash can. If you do not have a sharps container, call your pharmacist or healthcare provider to get one. Talk to your pediatrician regarding the use of this medicine in children. While this drug may be prescribed for selected conditions, precautions do apply. Overdosage: If you think you have taken too much of this medicine contact a poison control center or emergency room at  once. NOTE: This medicine is only for you. Do not share this medicine with others. What if I miss a dose? It is important not to miss your dose. Call your doctor or health care professional if you miss your dose. If you miss a dose due to an On-body Injector failure or leakage, a new dose should be administered as soon as possible using a single prefilled syringe for manual use. What may interact with this medicine? Interactions have not been studied. Give your health care provider a list of all the medicines, herbs, non-prescription drugs, or dietary supplements you use. Also tell them if you smoke, drink alcohol, or use illegal drugs. Some items may interact with your medicine. This list may not describe all possible interactions. Give your health care provider a list of all the medicines, herbs, non-prescription drugs, or dietary supplements you use. Also tell them if you smoke, drink alcohol, or use illegal drugs. Some items may interact with your medicine. What should I watch for while using this medicine? You may need blood work done while you are taking this medicine. If you are going to need a MRI, CT scan, or other procedure, tell your doctor that you are using this medicine (On-Body Injector only). What side effects may I notice from receiving this medicine? Side effects that you should report to your doctor or health care professional as soon as possible: -allergic reactions like skin rash, itching or hives, swelling of the face, lips, or tongue -dizziness -fever -pain, redness, or irritation at site where injected -pinpoint red spots on the skin -red or dark-brown urine -shortness of breath or breathing problems -stomach or side pain, or pain   at the shoulder -swelling -tiredness -trouble passing urine or change in the amount of urine Side effects that usually do not require medical attention (report to your doctor or health care professional if they continue or are  bothersome): -bone pain -muscle pain This list may not describe all possible side effects. Call your doctor for medical advice about side effects. You may report side effects to FDA at 1-800-FDA-1088. Where should I keep my medicine? Keep out of the reach of children. Store pre-filled syringes in a refrigerator between 2 and 8 degrees C (36 and 46 degrees F). Do not freeze. Keep in carton to protect from light. Throw away this medicine if it is left out of the refrigerator for more than 48 hours. Throw away any unused medicine after the expiration date. NOTE: This sheet is a summary. It may not cover all possible information. If you have questions about this medicine, talk to your doctor, pharmacist, or health care provider.    2016, Elsevier/Gold Standard. (2015-01-02 14:30:14)  

## 2016-06-17 NOTE — Telephone Encounter (Signed)
pt cld to r/s appt-gave pt time & date of r/s appt °

## 2016-06-30 ENCOUNTER — Other Ambulatory Visit: Payer: Self-pay | Admitting: *Deleted

## 2016-06-30 ENCOUNTER — Other Ambulatory Visit: Payer: Self-pay

## 2016-06-30 ENCOUNTER — Ambulatory Visit: Payer: Self-pay

## 2016-06-30 DIAGNOSIS — C8128 Mixed cellularity classical Hodgkin lymphoma, lymph nodes of multiple sites: Secondary | ICD-10-CM

## 2016-07-01 ENCOUNTER — Telehealth: Payer: Self-pay | Admitting: *Deleted

## 2016-07-01 ENCOUNTER — Ambulatory Visit: Payer: Self-pay

## 2016-07-01 NOTE — Telephone Encounter (Signed)
Patient called and rescheduled her missed appts from yesterday

## 2016-07-02 ENCOUNTER — Ambulatory Visit (HOSPITAL_BASED_OUTPATIENT_CLINIC_OR_DEPARTMENT_OTHER): Payer: Self-pay

## 2016-07-02 ENCOUNTER — Other Ambulatory Visit (HOSPITAL_BASED_OUTPATIENT_CLINIC_OR_DEPARTMENT_OTHER): Payer: Self-pay

## 2016-07-02 VITALS — BP 130/78 | HR 105 | Temp 97.9°F | Resp 18

## 2016-07-02 DIAGNOSIS — C8128 Mixed cellularity classical Hodgkin lymphoma, lymph nodes of multiple sites: Secondary | ICD-10-CM

## 2016-07-02 DIAGNOSIS — Z5189 Encounter for other specified aftercare: Secondary | ICD-10-CM

## 2016-07-02 DIAGNOSIS — Z5111 Encounter for antineoplastic chemotherapy: Secondary | ICD-10-CM

## 2016-07-02 LAB — COMPREHENSIVE METABOLIC PANEL
ALT: 15 U/L (ref 0–55)
ANION GAP: 13 meq/L — AB (ref 3–11)
AST: 20 U/L (ref 5–34)
Albumin: 3.8 g/dL (ref 3.5–5.0)
Alkaline Phosphatase: 124 U/L (ref 40–150)
BUN: 10.1 mg/dL (ref 7.0–26.0)
CALCIUM: 9.1 mg/dL (ref 8.4–10.4)
CHLORIDE: 106 meq/L (ref 98–109)
CO2: 21 mEq/L — ABNORMAL LOW (ref 22–29)
Creatinine: 1 mg/dL (ref 0.6–1.1)
Glucose: 76 mg/dl (ref 70–140)
POTASSIUM: 3.9 meq/L (ref 3.5–5.1)
Sodium: 141 mEq/L (ref 136–145)
Total Bilirubin: <0.3 mg/dL (ref 0.20–1.20)
Total Protein: 7.2 g/dL (ref 6.4–8.3)

## 2016-07-02 LAB — CBC WITH DIFFERENTIAL/PLATELET
BASO%: 0.3 % (ref 0.0–2.0)
BASOS ABS: 0 10*3/uL (ref 0.0–0.1)
EOS ABS: 0.1 10*3/uL (ref 0.0–0.5)
EOS%: 2.1 % (ref 0.0–7.0)
HCT: 38 % (ref 34.8–46.6)
HGB: 12.7 g/dL (ref 11.6–15.9)
LYMPH#: 0.4 10*3/uL — AB (ref 0.9–3.3)
LYMPH%: 12 % — AB (ref 14.0–49.7)
MCH: 31 pg (ref 25.1–34.0)
MCHC: 33.4 g/dL (ref 31.5–36.0)
MCV: 92.7 fL (ref 79.5–101.0)
MONO#: 0.3 10*3/uL (ref 0.1–0.9)
MONO%: 9.6 % (ref 0.0–14.0)
NEUT#: 2.5 10*3/uL (ref 1.5–6.5)
NEUT%: 76 % (ref 38.4–76.8)
PLATELETS: 248 10*3/uL (ref 145–400)
RBC: 4.1 10*6/uL (ref 3.70–5.45)
RDW: 17.1 % — AB (ref 11.2–14.5)
WBC: 3.3 10*3/uL — ABNORMAL LOW (ref 3.9–10.3)

## 2016-07-02 MED ORDER — SODIUM CHLORIDE 0.9 % IV SOLN
390.0000 mg/m2 | Freq: Once | INTRAVENOUS | Status: AC
  Administered 2016-07-02: 700 mg via INTRAVENOUS
  Filled 2016-07-02: qty 35

## 2016-07-02 MED ORDER — SODIUM CHLORIDE 0.9 % IV SOLN
Freq: Once | INTRAVENOUS | Status: AC
  Administered 2016-07-02: 10:00:00 via INTRAVENOUS

## 2016-07-02 MED ORDER — DOXORUBICIN HCL CHEMO IV INJECTION 2 MG/ML
25.0000 mg/m2 | Freq: Once | INTRAVENOUS | Status: AC
  Administered 2016-07-02: 44 mg via INTRAVENOUS
  Filled 2016-07-02: qty 22

## 2016-07-02 MED ORDER — PALONOSETRON HCL INJECTION 0.25 MG/5ML
INTRAVENOUS | Status: AC
  Filled 2016-07-02: qty 5

## 2016-07-02 MED ORDER — HEPARIN SOD (PORK) LOCK FLUSH 100 UNIT/ML IV SOLN
500.0000 [IU] | Freq: Once | INTRAVENOUS | Status: AC | PRN
  Administered 2016-07-02: 500 [IU]
  Filled 2016-07-02: qty 5

## 2016-07-02 MED ORDER — PEGFILGRASTIM 6 MG/0.6ML ~~LOC~~ PSKT
6.0000 mg | PREFILLED_SYRINGE | Freq: Once | SUBCUTANEOUS | Status: AC
  Administered 2016-07-02: 6 mg via SUBCUTANEOUS
  Filled 2016-07-02: qty 0.6

## 2016-07-02 MED ORDER — SODIUM CHLORIDE 0.9 % IV SOLN
Freq: Once | INTRAVENOUS | Status: AC
  Administered 2016-07-02: 10:00:00 via INTRAVENOUS
  Filled 2016-07-02: qty 5

## 2016-07-02 MED ORDER — SODIUM CHLORIDE 0.9% FLUSH
10.0000 mL | INTRAVENOUS | Status: DC | PRN
  Administered 2016-07-02: 10 mL
  Filled 2016-07-02: qty 10

## 2016-07-02 MED ORDER — PALONOSETRON HCL INJECTION 0.25 MG/5ML
0.2500 mg | Freq: Once | INTRAVENOUS | Status: AC
  Administered 2016-07-02: 0.25 mg via INTRAVENOUS

## 2016-07-02 MED ORDER — VINBLASTINE SULFATE CHEMO INJECTION 1 MG/ML
6.2000 mg/m2 | Freq: Once | INTRAVENOUS | Status: AC
  Administered 2016-07-02: 11 mg via INTRAVENOUS
  Filled 2016-07-02: qty 11

## 2016-07-02 NOTE — Patient Instructions (Signed)
[place] Discharge Instructions for Patients Receiving Chemotherapy  Today you received the following chemotherapy agents:  Adriamycin, Vinblastine, Dacarbazine  To help prevent nausea and vomiting after your treatment, we encourage you to take your nausea medication as prescribed.   If you develop nausea and vomiting that is not controlled by your nausea medication, call the clinic.   BELOW ARE SYMPTOMS THAT SHOULD BE REPORTED IMMEDIATELY:  *FEVER GREATER THAN 100.5 F  *CHILLS WITH OR WITHOUT FEVER  NAUSEA AND VOMITING THAT IS NOT CONTROLLED WITH YOUR NAUSEA MEDICATION  *UNUSUAL SHORTNESS OF BREATH  *UNUSUAL BRUISING OR BLEEDING  TENDERNESS IN MOUTH AND THROAT WITH OR WITHOUT PRESENCE OF ULCERS  *URINARY PROBLEMS  *BOWEL PROBLEMS  UNUSUAL RASH Items with * indicate a potential emergency and should be followed up as soon as possible.  Feel free to call the clinic you have any questions or concerns. The clinic phone number is [phone].  Please show the [place] at check-in to the Emergency Department and triage nurse.

## 2016-07-03 LAB — SEDIMENTATION RATE: SED RATE: 18 mm/h (ref 0–32)

## 2016-07-07 ENCOUNTER — Ambulatory Visit (HOSPITAL_COMMUNITY): Payer: MEDICAID

## 2016-07-12 ENCOUNTER — Other Ambulatory Visit: Payer: Self-pay | Admitting: *Deleted

## 2016-07-12 DIAGNOSIS — C8128 Mixed cellularity classical Hodgkin lymphoma, lymph nodes of multiple sites: Secondary | ICD-10-CM

## 2016-07-13 ENCOUNTER — Ambulatory Visit: Payer: Self-pay | Admitting: Hematology

## 2016-07-13 ENCOUNTER — Other Ambulatory Visit: Payer: Self-pay

## 2016-07-13 ENCOUNTER — Ambulatory Visit: Payer: Self-pay

## 2016-07-14 ENCOUNTER — Other Ambulatory Visit: Payer: Self-pay

## 2016-07-14 ENCOUNTER — Ambulatory Visit: Payer: Self-pay

## 2016-07-14 ENCOUNTER — Ambulatory Visit: Payer: Self-pay | Admitting: Hematology

## 2016-07-14 ENCOUNTER — Ambulatory Visit (HOSPITAL_COMMUNITY): Payer: Self-pay

## 2016-07-14 ENCOUNTER — Telehealth: Payer: Self-pay | Admitting: *Deleted

## 2016-07-14 NOTE — Progress Notes (Signed)
Pt did not receive treatment, so Injection of Neulasta not given

## 2016-07-14 NOTE — Telephone Encounter (Signed)
Called patient to inquire about missed lab/MD/chemo on 7/18.  Pt said she had trouble arranging transportation/childcare and was currently trying to figure that out so that she could come ASAP for her appointments.  Pt said she is otherwise fine and will call us by the end of the day to reschedule apts.

## 2016-07-15 NOTE — Telephone Encounter (Addendum)
FYI I need to reschedule the chemotherapy I missed this week."  Cal transferred to Infusion Scheduler.    Observe 07-16-2016 lab/treatment scheduled.

## 2016-07-16 ENCOUNTER — Other Ambulatory Visit (HOSPITAL_BASED_OUTPATIENT_CLINIC_OR_DEPARTMENT_OTHER): Payer: Self-pay

## 2016-07-16 ENCOUNTER — Ambulatory Visit (HOSPITAL_BASED_OUTPATIENT_CLINIC_OR_DEPARTMENT_OTHER): Payer: Self-pay

## 2016-07-16 VITALS — BP 120/70 | HR 87 | Temp 98.2°F | Resp 16

## 2016-07-16 DIAGNOSIS — Z5189 Encounter for other specified aftercare: Secondary | ICD-10-CM

## 2016-07-16 DIAGNOSIS — C8128 Mixed cellularity classical Hodgkin lymphoma, lymph nodes of multiple sites: Secondary | ICD-10-CM

## 2016-07-16 DIAGNOSIS — Z5111 Encounter for antineoplastic chemotherapy: Secondary | ICD-10-CM

## 2016-07-16 LAB — CBC WITH DIFFERENTIAL/PLATELET
BASO%: 0.5 % (ref 0.0–2.0)
BASOS ABS: 0 10*3/uL (ref 0.0–0.1)
EOS%: 1.7 % (ref 0.0–7.0)
Eosinophils Absolute: 0.1 10*3/uL (ref 0.0–0.5)
HCT: 40.2 % (ref 34.8–46.6)
HGB: 13.4 g/dL (ref 11.6–15.9)
LYMPH%: 6.5 % — ABNORMAL LOW (ref 14.0–49.7)
MCH: 31.2 pg (ref 25.1–34.0)
MCHC: 33.4 g/dL (ref 31.5–36.0)
MCV: 93.5 fL (ref 79.5–101.0)
MONO#: 0.4 10*3/uL (ref 0.1–0.9)
MONO%: 8.6 % (ref 0.0–14.0)
NEUT#: 4 10*3/uL (ref 1.5–6.5)
NEUT%: 82.7 % — AB (ref 38.4–76.8)
Platelets: 247 10*3/uL (ref 145–400)
RBC: 4.3 10*6/uL (ref 3.70–5.45)
RDW: 17.5 % — AB (ref 11.2–14.5)
WBC: 4.8 10*3/uL (ref 3.9–10.3)
lymph#: 0.3 10*3/uL — ABNORMAL LOW (ref 0.9–3.3)

## 2016-07-16 LAB — COMPREHENSIVE METABOLIC PANEL
ALT: <9 U/L (ref 0–55)
AST: 14 U/L (ref 5–34)
Albumin: 3.9 g/dL (ref 3.5–5.0)
Alkaline Phosphatase: 132 U/L (ref 40–150)
Anion Gap: 13 mEq/L — ABNORMAL HIGH (ref 3–11)
BILIRUBIN TOTAL: 0.49 mg/dL (ref 0.20–1.20)
BUN: 7.9 mg/dL (ref 7.0–26.0)
CHLORIDE: 105 meq/L (ref 98–109)
CO2: 23 mEq/L (ref 22–29)
Calcium: 9.9 mg/dL (ref 8.4–10.4)
Creatinine: 0.8 mg/dL (ref 0.6–1.1)
EGFR: >90 mL/min/{1.73_m2} (ref >90)
GLUCOSE: 76 mg/dL (ref 70–140)
POTASSIUM: 4.2 meq/L (ref 3.5–5.1)
SODIUM: 141 meq/L (ref 136–145)
Total Protein: 7.5 g/dL (ref 6.4–8.3)

## 2016-07-16 MED ORDER — PEGFILGRASTIM 6 MG/0.6ML ~~LOC~~ PSKT
6.0000 mg | PREFILLED_SYRINGE | Freq: Once | SUBCUTANEOUS | Status: AC
  Administered 2016-07-16: 6 mg via SUBCUTANEOUS
  Filled 2016-07-16: qty 0.6

## 2016-07-16 MED ORDER — SODIUM CHLORIDE 0.9% FLUSH
10.0000 mL | INTRAVENOUS | Status: DC | PRN
Start: 2016-07-16 — End: 2016-07-16
  Administered 2016-07-16: 10 mL
  Filled 2016-07-16: qty 10

## 2016-07-16 MED ORDER — PALONOSETRON HCL INJECTION 0.25 MG/5ML
INTRAVENOUS | Status: AC
  Filled 2016-07-16: qty 5

## 2016-07-16 MED ORDER — HEPARIN SOD (PORK) LOCK FLUSH 100 UNIT/ML IV SOLN
500.0000 [IU] | Freq: Once | INTRAVENOUS | Status: AC | PRN
  Administered 2016-07-16: 500 [IU]
  Filled 2016-07-16: qty 5

## 2016-07-16 MED ORDER — SODIUM CHLORIDE 0.9 % IV SOLN
Freq: Once | INTRAVENOUS | Status: AC
  Administered 2016-07-16: 14:00:00 via INTRAVENOUS
  Filled 2016-07-16: qty 5

## 2016-07-16 MED ORDER — DOXORUBICIN HCL CHEMO IV INJECTION 2 MG/ML
25.0000 mg/m2 | Freq: Once | INTRAVENOUS | Status: AC
  Administered 2016-07-16: 44 mg via INTRAVENOUS
  Filled 2016-07-16: qty 22

## 2016-07-16 MED ORDER — SODIUM CHLORIDE 0.9 % IV SOLN
Freq: Once | INTRAVENOUS | Status: AC
  Administered 2016-07-16: 13:00:00 via INTRAVENOUS

## 2016-07-16 MED ORDER — DACARBAZINE 200 MG IV SOLR
390.0000 mg/m2 | Freq: Once | INTRAVENOUS | Status: AC
  Administered 2016-07-16: 700 mg via INTRAVENOUS
  Filled 2016-07-16: qty 35

## 2016-07-16 MED ORDER — VINBLASTINE SULFATE CHEMO INJECTION 1 MG/ML
6.2000 mg/m2 | Freq: Once | INTRAVENOUS | Status: AC
  Administered 2016-07-16: 11 mg via INTRAVENOUS
  Filled 2016-07-16: qty 11

## 2016-07-16 MED ORDER — PALONOSETRON HCL INJECTION 0.25 MG/5ML
0.2500 mg | Freq: Once | INTRAVENOUS | Status: AC
  Administered 2016-07-16: 0.25 mg via INTRAVENOUS

## 2016-07-16 NOTE — Patient Instructions (Signed)
[facility] Discharge Instructions for Patients Receiving Chemotherapy  Today you received the following chemotherapy agents adriamycin/vinblastine/dacarbazine   To help prevent nausea and vomiting after your treatment, we encourage you to take your nausea medication as directed  If you develop nausea and vomiting that is not controlled by your nausea medication, call the clinic.   BELOW ARE SYMPTOMS THAT SHOULD BE REPORTED IMMEDIATELY:  *FEVER GREATER THAN 100.5 F  *CHILLS WITH OR WITHOUT FEVER  NAUSEA AND VOMITING THAT IS NOT CONTROLLED WITH YOUR NAUSEA MEDICATION  *UNUSUAL SHORTNESS OF BREATH  *UNUSUAL BRUISING OR BLEEDING  TENDERNESS IN MOUTH AND THROAT WITH OR WITHOUT PRESENCE OF ULCERS  *URINARY PROBLEMS  *BOWEL PROBLEMS  UNUSUAL RASH Items with * indicate a potential emergency and should be followed up as soon as possible.  Feel free to call the clinic you have any questions or concerns. The clinic phone number is [phone].  

## 2016-07-19 ENCOUNTER — Other Ambulatory Visit: Payer: Self-pay | Admitting: Hematology

## 2016-07-19 ENCOUNTER — Other Ambulatory Visit: Payer: Self-pay | Admitting: *Deleted

## 2016-07-22 ENCOUNTER — Encounter (HOSPITAL_COMMUNITY)
Admission: RE | Admit: 2016-07-22 | Discharge: 2016-07-22 | Disposition: A | Discharge status: Home / Self Care | Payer: MEDICAID | Source: Ambulatory Visit | Attending: Hematology | Admitting: Hematology

## 2016-07-22 DIAGNOSIS — C8128 Mixed cellularity classical Hodgkin lymphoma, lymph nodes of multiple sites: Secondary | ICD-10-CM

## 2016-07-22 DIAGNOSIS — J984 Other disorders of lung: Secondary | ICD-10-CM | POA: Insufficient documentation

## 2016-07-22 LAB — GLUCOSE, CAPILLARY: Glucose-Capillary: 70 mg/dL (ref 65–99)

## 2016-07-22 MED ORDER — FLUDEOXYGLUCOSE F - 18 (FDG) INJECTION
7.7000 | Freq: Once | INTRAVENOUS | Status: AC | PRN
  Administered 2016-07-22: 7.7 via INTRAVENOUS

## 2016-07-30 ENCOUNTER — Other Ambulatory Visit: Payer: Self-pay | Admitting: *Deleted

## 2016-07-30 DIAGNOSIS — C8128 Mixed cellularity classical Hodgkin lymphoma, lymph nodes of multiple sites: Secondary | ICD-10-CM

## 2016-08-02 ENCOUNTER — Other Ambulatory Visit (HOSPITAL_BASED_OUTPATIENT_CLINIC_OR_DEPARTMENT_OTHER): Payer: Self-pay

## 2016-08-02 ENCOUNTER — Ambulatory Visit (HOSPITAL_BASED_OUTPATIENT_CLINIC_OR_DEPARTMENT_OTHER): Payer: Self-pay | Admitting: Hematology

## 2016-08-02 ENCOUNTER — Ambulatory Visit (HOSPITAL_BASED_OUTPATIENT_CLINIC_OR_DEPARTMENT_OTHER): Payer: Self-pay

## 2016-08-02 ENCOUNTER — Encounter: Payer: Self-pay | Admitting: Hematology

## 2016-08-02 VITALS — BP 131/78 | HR 77 | Temp 98.3°F | Resp 18 | Ht 66.0 in | Wt 152.7 lb

## 2016-08-02 DIAGNOSIS — Z5111 Encounter for antineoplastic chemotherapy: Secondary | ICD-10-CM

## 2016-08-02 DIAGNOSIS — Z720 Tobacco use: Secondary | ICD-10-CM

## 2016-08-02 DIAGNOSIS — C8128 Mixed cellularity classical Hodgkin lymphoma, lymph nodes of multiple sites: Secondary | ICD-10-CM

## 2016-08-02 DIAGNOSIS — Z5189 Encounter for other specified aftercare: Secondary | ICD-10-CM

## 2016-08-02 DIAGNOSIS — F17200 Nicotine dependence, unspecified, uncomplicated: Secondary | ICD-10-CM

## 2016-08-02 DIAGNOSIS — J9692 Respiratory failure, unspecified with hypercapnia: Secondary | ICD-10-CM

## 2016-08-02 LAB — CBC WITH DIFFERENTIAL/PLATELET
BASO%: 0.3 % (ref 0.0–2.0)
Basophils Absolute: 0 10*3/uL (ref 0.0–0.1)
EOS%: 1.4 % (ref 0.0–7.0)
Eosinophils Absolute: 0.1 10*3/uL (ref 0.0–0.5)
HEMATOCRIT: 37 % (ref 34.8–46.6)
HEMOGLOBIN: 12.4 g/dL (ref 11.6–15.9)
LYMPH#: 0.3 10*3/uL — AB (ref 0.9–3.3)
LYMPH%: 8 % — ABNORMAL LOW (ref 14.0–49.7)
MCH: 32 pg (ref 25.1–34.0)
MCHC: 33.5 g/dL (ref 31.5–36.0)
MCV: 95.5 fL (ref 79.5–101.0)
MONO#: 0.5 10*3/uL (ref 0.1–0.9)
MONO%: 12.9 % (ref 0.0–14.0)
NEUT%: 77.4 % — AB (ref 38.4–76.8)
NEUTROS ABS: 2.9 10*3/uL (ref 1.5–6.5)
PLATELETS: 260 10*3/uL (ref 145–400)
RBC: 3.87 10*6/uL (ref 3.70–5.45)
RDW: 16.7 % — AB (ref 11.2–14.5)
WBC: 3.7 10*3/uL — AB (ref 3.9–10.3)

## 2016-08-02 LAB — COMPREHENSIVE METABOLIC PANEL
ALT: 13 U/L (ref 0–55)
ANION GAP: 10 meq/L (ref 3–11)
AST: 21 U/L (ref 5–34)
Albumin: 4.1 g/dL (ref 3.5–5.0)
Alkaline Phosphatase: 124 U/L (ref 40–150)
BILIRUBIN TOTAL: 0.45 mg/dL (ref 0.20–1.20)
BUN: 9.3 mg/dL (ref 7.0–26.0)
CALCIUM: 9.6 mg/dL (ref 8.4–10.4)
CO2: 23 mEq/L (ref 22–29)
CREATININE: 0.7 mg/dL (ref 0.6–1.1)
Chloride: 107 mEq/L (ref 98–109)
EGFR: >90 mL/min/{1.73_m2} (ref >90)
Glucose: 67 mg/dl — ABNORMAL LOW (ref 70–140)
Potassium: 4.9 mEq/L (ref 3.5–5.1)
Sodium: 141 mEq/L (ref 136–145)
TOTAL PROTEIN: 7.2 g/dL (ref 6.4–8.3)

## 2016-08-02 MED ORDER — DOXORUBICIN HCL CHEMO IV INJECTION 2 MG/ML
25.0000 mg/m2 | Freq: Once | INTRAVENOUS | Status: AC
  Administered 2016-08-02: 44 mg via INTRAVENOUS
  Filled 2016-08-02: qty 22

## 2016-08-02 MED ORDER — SODIUM CHLORIDE 0.9 % IV SOLN
390.0000 mg/m2 | Freq: Once | INTRAVENOUS | Status: AC
  Administered 2016-08-02: 700 mg via INTRAVENOUS
  Filled 2016-08-02: qty 35

## 2016-08-02 MED ORDER — SODIUM CHLORIDE 0.9 % IV SOLN
Freq: Once | INTRAVENOUS | Status: AC
  Administered 2016-08-02: 13:00:00 via INTRAVENOUS

## 2016-08-02 MED ORDER — HEPARIN SOD (PORK) LOCK FLUSH 100 UNIT/ML IV SOLN
500.0000 [IU] | Freq: Once | INTRAVENOUS | Status: AC | PRN
  Administered 2016-08-02: 500 [IU]
  Filled 2016-08-02: qty 5

## 2016-08-02 MED ORDER — VINBLASTINE SULFATE CHEMO INJECTION 1 MG/ML
6.2000 mg/m2 | Freq: Once | INTRAVENOUS | Status: AC
  Administered 2016-08-02: 11 mg via INTRAVENOUS
  Filled 2016-08-02: qty 11

## 2016-08-02 MED ORDER — SODIUM CHLORIDE 0.9 % IV SOLN
Freq: Once | INTRAVENOUS | Status: AC
  Administered 2016-08-02: 13:00:00 via INTRAVENOUS
  Filled 2016-08-02: qty 5

## 2016-08-02 MED ORDER — PEGFILGRASTIM 6 MG/0.6ML ~~LOC~~ PSKT
6.0000 mg | PREFILLED_SYRINGE | Freq: Once | SUBCUTANEOUS | Status: AC
  Administered 2016-08-02: 6 mg via SUBCUTANEOUS
  Filled 2016-08-02: qty 0.6

## 2016-08-02 MED ORDER — SODIUM CHLORIDE 0.9% FLUSH
10.0000 mL | INTRAVENOUS | Status: DC | PRN
  Administered 2016-08-02: 10 mL
  Filled 2016-08-02: qty 10

## 2016-08-02 MED ORDER — PALONOSETRON HCL INJECTION 0.25 MG/5ML
INTRAVENOUS | Status: AC
  Filled 2016-08-02: qty 5

## 2016-08-02 MED ORDER — PALONOSETRON HCL INJECTION 0.25 MG/5ML
0.2500 mg | Freq: Once | INTRAVENOUS | Status: AC
  Administered 2016-08-02: 0.25 mg via INTRAVENOUS

## 2016-08-02 MED FILL — LIDOCAINE-PRILOCAINE CREAM: 2.5-2.5 | 20 days supply | Qty: 30 | Fill #0

## 2016-08-02 NOTE — Patient Instructions (Signed)
[place] Discharge Instructions for Patients Receiving Chemotherapy  Today you received the following chemotherapy agents Adriamycin/ Velban/ DTIC  To help prevent nausea and vomiting after your treatment, we encourage you to take your nausea medication   If you develop nausea and vomiting that is not controlled by your nausea medication, call the clinic.   BELOW ARE SYMPTOMS THAT SHOULD BE REPORTED IMMEDIATELY:  *FEVER GREATER THAN 100.5 F  *CHILLS WITH OR WITHOUT FEVER  NAUSEA AND VOMITING THAT IS NOT CONTROLLED WITH YOUR NAUSEA MEDICATION  *UNUSUAL SHORTNESS OF BREATH  *UNUSUAL BRUISING OR BLEEDING  TENDERNESS IN MOUTH AND THROAT WITH OR WITHOUT PRESENCE OF ULCERS  *URINARY PROBLEMS  *BOWEL PROBLEMS  UNUSUAL RASH Items with * indicate a potential emergency and should be followed up as soon as possible.  Feel free to call the clinic you have any questions or concerns. The clinic phone number is [phone].  Please show the [place] at check-in to the Emergency Department and triage nurse.

## 2016-08-03 LAB — SEDIMENTATION RATE: Sedimentation Rate-Westergren: 6 mm/hr (ref 0–32)

## 2016-08-07 NOTE — Progress Notes (Signed)
Heidi [name]  HEMATOLOGY ONCOLOGY PROGRESS NOTE  Date of service: .08/02/2016   Patient Care Team: Maren Reamer, MD as PCP - General (Internal Medicine)  Chief complaint: Follow-up for Hodgkin's lymphoma  Diagnosis: Mixed cellularity Hodgkin's lymphoma IVBE with extensive lymphadenopathy including right axillary, mediastinal and upper retroperitoneal and now biopsy proven pulmonary involvement. She was noted to have significant constitutional symptoms including significant weight loss, fevers chills and some night sweats.  Current Treatment:  Getting cycle 4 D 15 of AVD (without bleomycin due to lung involvement and DLCO of 37%, active smoker)  INTERVAL HISTORY:  Heidi [name] is here for follow-up of her Hodgkin's lymphoma prior to her cycle 4 D15 of AVD. She is here with her sister. Notes that she is feeling well and is close to her normal body weight. No other acute new symptoms. No tingling or numbness in her hands or feet. We discussed her PET/CT results that show good response to treatment.  REVIEW OF SYSTEMS:    10 Point review of systems of done and is negative except as noted above.  . Past Medical History:  Diagnosis Date  . Asthma   . Hodgkin lymphoma ([place])   . Hypertension     . Past Surgical History:  Procedure Laterality Date  . AXILLARY LYMPH NODE BIOPSY Right 03/19/2016   Procedure: AXILLARY LYMPH NODE BIOPSY;  Surgeon: Armandina Gemma, MD;  Location: [facility];  Service: General;  Laterality: Right;    . Social History  Substance Use Topics  . Smoking status: Former Smoker    Packs/day: 0.50    Years: 15.00    Types: Cigarettes    Quit date: 03/27/2016  . Smokeless tobacco: Never Used  . Alcohol use Yes     Comment: occasional now    ALLERGIES:  has No Known Allergies.  MEDICATIONS:  Current Outpatient Prescriptions  Medication Sig Dispense Refill  . dexamethasone (DECADRON) 4 MG tablet 2 tab (8mg ) twice a day for 3 days starting on day of chemotherapy 30  tablet 3  . guaiFENesin (MUCINEX) 600 MG 12 hr tablet Take 1,200 mg by mouth 2 (two) times daily as needed for cough or to loosen phlegm.    . labetalol (NORMODYNE) 100 MG tablet TK 1 T PO  BID  6  . levalbuterol (XOPENEX HFA) 45 MCG/ACT inhaler Inhale 2 puffs into the lungs every 6 (six) hours as needed for wheezing. 1 Inhaler 12  . lidocaine-prilocaine (EMLA) cream Apply 1 application topically as needed.    . loratadine (CLARITIN) 10 MG tablet Take 1 tablet (10 mg total) by mouth daily. For up to 5 days following Neulasta injection. 5 tablet 2  . metoprolol (LOPRESSOR) 50 MG tablet Take 1 tablet (50 mg total) by mouth 2 (two) times daily. 60 tablet 0  . ondansetron (ZOFRAN) 8 MG tablet Take 1 tablet (8 mg total) by mouth every 8 (eight) hours as needed for nausea (start on the 3rd day after chemotherapy). 30 tablet 3  . prochlorperazine (COMPAZINE) 10 MG tablet Take 1 tablet (10 mg total) by mouth every 6 (six) hours as needed for nausea or vomiting. 30 tablet 3   No current facility-administered medications for this visit.     PHYSICAL EXAMINATION: ECOG PERFORMANCE STATUS: 1 - Symptomatic but completely ambulatory  . Vitals:   08/02/16 1112  BP: 131/78  Pulse: 77  Resp: 18  Temp: 98.3 F (36.8 C)    Filed Weights   08/02/16 1112  Weight: 152 lb 11.2 oz (  69.3 kg)   .Body mass index is 24.65 kg/m.  OROPHARYNX: Moist mucous membranes No thrush NECK: supple, no JVD LYMPH:No palpable cervical , axillary or inguinal LNadenoapthy LUNGS:  improved air entry and with no rales and only scattered rhonci HEART: regular rate & rhythm ABDOMEN: abdomen soft, nontender, normoactive bowel sounds Extremities no edema  NEURO: Awake alert and oriented 3, moving all 4 extremities. No overt focal neurological deficit.  LABORATORY DATA:   I have reviewed the data as listed  . CBC Latest Ref Rng & Units 08/02/2016 07/16/2016 07/02/2016  WBC 3.9 - 10.3 10e3/uL 3.7(L) 4.8 3.3(L)  Hemoglobin  11.6 - 15.9 g/dL 12.4 13.4 12.7  Hematocrit 34.8 - 46.6 % 37.0 40.2 38.0  Platelets 145 - 400 10e3/uL 260 247 248    . CMP Latest Ref Rng & Units 08/02/2016 07/16/2016 07/02/2016  Glucose 70 - 140 mg/dl 67(L) 76 76  BUN 7.0 - 26.0 mg/dL 9.3 7.9 10.1  Creatinine 0.6 - 1.1 mg/dL 0.7 0.8 1.0  Sodium 136 - 145 mEq/L 141 141 141  Potassium 3.5 - 5.1 mEq/L 4.9 4.2 3.9  Chloride 101 - 111 mmol/L - - -  CO2 22 - 29 mEq/L 23 23 21(L)  Calcium 8.4 - 10.4 mg/dL 9.6 9.9 9.1  Total Protein 6.4 - 8.3 g/dL 7.2 7.5 7.2  Total Bilirubin 0.20 - 1.20 mg/dL 0.45 0.49 <0.30  Alkaline Phos 40 - 150 U/L 124 132 124  AST 5 - 34 U/L 21 14 20   ALT 0 - 55 U/L 13 <9 15    RADIOGRAPHIC STUDIES: I have personally reviewed the radiological images as listed and agreed with the findings in the report. Nm Pet Image Restag (ps) Skull Base To Thigh  Result Date: 07/22/2016 CLINICAL DATA:  Subsequent treatment strategy for Hodgkin lymphoma. Status post 3 cycles of chemotherapy. EXAM: NUCLEAR MEDICINE PET SKULL BASE TO THIGH TECHNIQUE: 7.7 mCi F-18 FDG was injected intravenously. Full-ring PET imaging was performed from the skull base to thigh after the radiotracer. CT data was obtained and used for attenuation correction and anatomic localization. FASTING BLOOD GLUCOSE:  Value: 70 mg/dl COMPARISON:  05/12/2016 FINDINGS: NECK No hypermetabolic lymph nodes in the neck. Previously seen small right level 2 jugular lymph node currently measures 5 mm on image 19/series 4 compared to 9 mm previously and shows no associated metabolic activity. CHEST Hypermetabolic right axillary and subpectoral lymphadenopathy has resolved since previous study. There is persistent mild mediastinal lymphadenopathy, however this shows no persistent hypermetabolic activity on current exam. Index lymph node in the right paratracheal region measures 1.5 cm on image 55/4 compared to 17 mm previously. There has been significant improvement in diffuse right lung  airspace disease as well as focal and nodular areas of airspace opacity in the left lung since previous study. SUV max and right lower lobe airspace disease currently measures 3.3 compared to 6.4 previously. ABDOMEN/PELVIS No abnormal hypermetabolic activity within the liver, pancreas, adrenal glands, or spleen. There has been near complete resolution of hypermetabolic abdominal retroperitoneal lymphadenopathy since previous study, with index precaval lymph node currently measuring 9 mm on image 128/series 4 compared to 14 mm previously. This has SUV max of 4.2 compared to 6.7 previously. No hypermetabolic pelvic lymphadenopathy identified. SKELETON Diffusely increased skeletal marrow hyper metabolism, consistent with metabolic response to therapy. IMPRESSION: Near complete metabolic response to therapy of nodal disease within the neck, chest, and abdomen, with only minimal persistent hypermetabolic lymphadenopathy in the abdominal retroperitoneum. Significant improvement in hypermetabolic bilateral  pulmonary airspace disease, right side greater than left. Electronically Signed   By: Earle Gell M.D.   On: 07/22/2016 17:11   ASSESSMENT & PLAN:   30 year old African-American female with  #1 Mixed cellularity Hodgkin's lymphoma IV B E with extensive lymphadenopathy including right axillary, mediastinal and upper retroperitoneal and now biopsy proven pulmonary involvement. She was noted to have significant constitutional symptoms including significant weight loss, fevers chills and some night sweats. HIV negative Hepatitis C and hepatitis B serologies negative. Echo with normal ejection fraction. Patient is here for follow-up prior to her cycle 4 day 15 AVD (bleomycin on hold due to lung compromise) DLCO on her pulmonary function tests are 37 percent suggesting severe reduction.  #2 hypoxic respiratory failure with dense right lung consolidation and left upper lobe consolidation. Pulmonary biopsy +ve for  CHL - improved. Currently off oxygen. Breathing continues to improve Appears to have some chronic hypercapnia.  #3 ABdominal distension -resolved Plan Patient is doing well with no prohibitive toxicities. Labs currently stable. -She is appropriate to proceed with C4 D15  AVD ( without bleomycin) with Neulasta support given 2 recent hospitalizations for possible pneumonia. -we discussed and reviewed images from her recently PET/CT after 3 cycles which showed near complete metabolic response to treatment for her HL. -Since the patient is still smoking and the pulmonary function testing shows severe reduction in DLCO we shall continue to hold her bleomycin at this time. -She was again counseled regarding absolute smoking cessation   Return to care with Dr. Irene Limbo in 4 weeks with CBC CMP prior to cycle 5 D15 with labs and repeat PET/CT scan.  I spent 25 minutes counseling the patient face to face. The total time spent in the appointment was 25 minutes and more than 50% was on counseling and direct patient cares.    Sullivan Lone MD [place] AAHIVMS [facility] Hematology/Oncology Physician [facility]  (Office):       [phone] (Work cell):  [phone] (Fax):           [phone]

## 2016-08-13 ENCOUNTER — Other Ambulatory Visit: Payer: Self-pay | Admitting: *Deleted

## 2016-08-13 DIAGNOSIS — C8128 Mixed cellularity classical Hodgkin lymphoma, lymph nodes of multiple sites: Secondary | ICD-10-CM

## 2016-08-16 ENCOUNTER — Ambulatory Visit: Payer: Self-pay

## 2016-08-16 ENCOUNTER — Other Ambulatory Visit: Payer: Self-pay

## 2016-08-18 ENCOUNTER — Other Ambulatory Visit: Payer: Self-pay | Admitting: Hematology

## 2016-08-18 ENCOUNTER — Telehealth: Payer: Self-pay | Admitting: Hematology

## 2016-08-18 NOTE — Telephone Encounter (Signed)
lvm to inform pt of 8/25 appts per LOS

## 2016-08-20 ENCOUNTER — Other Ambulatory Visit: Payer: Self-pay

## 2016-08-20 ENCOUNTER — Ambulatory Visit: Payer: Self-pay

## 2016-08-31 ENCOUNTER — Ambulatory Visit: Payer: Self-pay

## 2016-08-31 ENCOUNTER — Other Ambulatory Visit: Payer: Self-pay

## 2016-09-03 ENCOUNTER — Ambulatory Visit (HOSPITAL_BASED_OUTPATIENT_CLINIC_OR_DEPARTMENT_OTHER): Payer: Self-pay

## 2016-09-03 ENCOUNTER — Ambulatory Visit (HOSPITAL_BASED_OUTPATIENT_CLINIC_OR_DEPARTMENT_OTHER): Payer: Self-pay | Admitting: Hematology

## 2016-09-03 ENCOUNTER — Encounter: Payer: Self-pay | Admitting: Hematology

## 2016-09-03 ENCOUNTER — Other Ambulatory Visit (HOSPITAL_BASED_OUTPATIENT_CLINIC_OR_DEPARTMENT_OTHER): Payer: Self-pay

## 2016-09-03 VITALS — BP 132/78 | HR 85 | Temp 99.1°F | Resp 16 | Ht 66.0 in | Wt 155.1 lb

## 2016-09-03 DIAGNOSIS — Z5189 Encounter for other specified aftercare: Secondary | ICD-10-CM

## 2016-09-03 DIAGNOSIS — Z5111 Encounter for antineoplastic chemotherapy: Secondary | ICD-10-CM

## 2016-09-03 DIAGNOSIS — C8128 Mixed cellularity classical Hodgkin lymphoma, lymph nodes of multiple sites: Secondary | ICD-10-CM

## 2016-09-03 DIAGNOSIS — F17200 Nicotine dependence, unspecified, uncomplicated: Secondary | ICD-10-CM

## 2016-09-03 DIAGNOSIS — J9692 Respiratory failure, unspecified with hypercapnia: Secondary | ICD-10-CM

## 2016-09-03 DIAGNOSIS — Z720 Tobacco use: Secondary | ICD-10-CM

## 2016-09-03 LAB — COMPREHENSIVE METABOLIC PANEL
ALT: 20 U/L (ref 0–55)
ANION GAP: 12 meq/L — AB (ref 3–11)
AST: 27 U/L (ref 5–34)
Albumin: 3.6 g/dL (ref 3.5–5.0)
Alkaline Phosphatase: 156 U/L — ABNORMAL HIGH (ref 40–150)
BILIRUBIN TOTAL: 0.32 mg/dL (ref 0.20–1.20)
BUN: 6.9 mg/dL — ABNORMAL LOW (ref 7.0–26.0)
CHLORIDE: 105 meq/L (ref 98–109)
CO2: 22 meq/L (ref 22–29)
Calcium: 9.3 mg/dL (ref 8.4–10.4)
Creatinine: 0.7 mg/dL (ref 0.6–1.1)
Glucose: 110 mg/dl (ref 70–140)
POTASSIUM: 4 meq/L (ref 3.5–5.1)
SODIUM: 139 meq/L (ref 136–145)
TOTAL PROTEIN: 7.4 g/dL (ref 6.4–8.3)

## 2016-09-03 LAB — CBC WITH DIFFERENTIAL/PLATELET
BASO%: 0.2 % (ref 0.0–2.0)
BASOS ABS: 0 10*3/uL (ref 0.0–0.1)
EOS ABS: 0.1 10*3/uL (ref 0.0–0.5)
EOS%: 1.6 % (ref 0.0–7.0)
HCT: 39.8 % (ref 34.8–46.6)
HGB: 13.3 g/dL (ref 11.6–15.9)
LYMPH%: 3.3 % — AB (ref 14.0–49.7)
MCH: 31.5 pg (ref 25.1–34.0)
MCHC: 33.3 g/dL (ref 31.5–36.0)
MCV: 94.8 fL (ref 79.5–101.0)
MONO#: 0.6 10*3/uL (ref 0.1–0.9)
MONO%: 8 % (ref 0.0–14.0)
NEUT#: 6.4 10*3/uL (ref 1.5–6.5)
NEUT%: 86.9 % — AB (ref 38.4–76.8)
PLATELETS: 323 10*3/uL (ref 145–400)
RBC: 4.21 10*6/uL (ref 3.70–5.45)
RDW: 13.9 % (ref 11.2–14.5)
WBC: 7.4 10*3/uL (ref 3.9–10.3)
lymph#: 0.2 10*3/uL — ABNORMAL LOW (ref 0.9–3.3)

## 2016-09-03 MED ORDER — PALONOSETRON HCL INJECTION 0.25 MG/5ML
0.2500 mg | Freq: Once | INTRAVENOUS | Status: AC
  Administered 2016-09-03: 0.25 mg via INTRAVENOUS

## 2016-09-03 MED ORDER — DOXORUBICIN HCL CHEMO IV INJECTION 2 MG/ML
25.0000 mg/m2 | Freq: Once | INTRAVENOUS | Status: AC
  Administered 2016-09-03: 44 mg via INTRAVENOUS
  Filled 2016-09-03: qty 22

## 2016-09-03 MED ORDER — PALONOSETRON HCL INJECTION 0.25 MG/5ML
INTRAVENOUS | Status: AC
  Filled 2016-09-03: qty 5

## 2016-09-03 MED ORDER — SODIUM CHLORIDE 0.9 % IV SOLN
Freq: Once | INTRAVENOUS | Status: AC
  Administered 2016-09-03: 12:00:00 via INTRAVENOUS
  Filled 2016-09-03: qty 5

## 2016-09-03 MED ORDER — PEGFILGRASTIM 6 MG/0.6ML ~~LOC~~ PSKT
6.0000 mg | PREFILLED_SYRINGE | Freq: Once | SUBCUTANEOUS | Status: AC
  Administered 2016-09-03: 6 mg via SUBCUTANEOUS
  Filled 2016-09-03: qty 0.6

## 2016-09-03 MED ORDER — SODIUM CHLORIDE 0.9 % IV SOLN
Freq: Once | INTRAVENOUS | Status: AC
  Administered 2016-09-03: 11:00:00 via INTRAVENOUS

## 2016-09-03 MED ORDER — SODIUM CHLORIDE 0.9% FLUSH
10.0000 mL | INTRAVENOUS | Status: DC | PRN
  Administered 2016-09-03: 10 mL
  Filled 2016-09-03: qty 10

## 2016-09-03 MED ORDER — SODIUM CHLORIDE 0.9 % IV SOLN
390.0000 mg/m2 | Freq: Once | INTRAVENOUS | Status: AC
  Administered 2016-09-03: 700 mg via INTRAVENOUS
  Filled 2016-09-03: qty 35

## 2016-09-03 MED ORDER — VINBLASTINE SULFATE CHEMO INJECTION 1 MG/ML
6.2000 mg/m2 | Freq: Once | INTRAVENOUS | Status: AC
  Administered 2016-09-03: 11 mg via INTRAVENOUS
  Filled 2016-09-03: qty 11

## 2016-09-03 MED ORDER — HEPARIN SOD (PORK) LOCK FLUSH 100 UNIT/ML IV SOLN
500.0000 [IU] | Freq: Once | INTRAVENOUS | Status: AC | PRN
  Administered 2016-09-03: 500 [IU]
  Filled 2016-09-03: qty 5

## 2016-09-03 NOTE — Progress Notes (Signed)
[name]  HEMATOLOGY ONCOLOGY PROGRESS NOTE  Date of service: .09/03/2016   Patient Care Team: Maren Reamer, MD as PCP - General (Internal Medicine)  Chief complaint: Follow-up for Hodgkin's lymphoma  Diagnosis: Mixed cellularity Hodgkin's lymphoma IVBE with extensive lymphadenopathy including right axillary, mediastinal and upper retroperitoneal and now biopsy proven pulmonary involvement. She was noted to have significant constitutional symptoms including significant weight loss, fevers chills and some night sweats.  Current Treatment:  Getting cycle 5 D 1 of AVD (without bleomycin due to lung involvement and DLCO of 37%, active smoker)  INTERVAL HISTORY:  Heidi [name] is here for follow-up of her Hodgkin's lymphoma prior to her cycle 5 D1 of AVD. She was supposed to follow up for her cycle 5 day 1 chemotherapy on 08/20/2016 but did not follow-up for no reason. We discussed that it is highly imperative for her not to have any treatment delays. She was counseled on the need for importance of medication compliance. Labs are stable today. Still smoking 2-3 cigarettes a day. No other acute new concerns.   REVIEW OF SYSTEMS:    10 Point review of systems of done and is negative except as noted above.  . Past Medical History:  Diagnosis Date  . Asthma   . Hodgkin lymphoma ([place])   . Hypertension     . Past Surgical History:  Procedure Laterality Date  . AXILLARY LYMPH NODE BIOPSY Right 03/19/2016   Procedure: AXILLARY LYMPH NODE BIOPSY;  Surgeon: Armandina Gemma, MD;  Location: [facility];  Service: General;  Laterality: Right;    . Social History  Substance Use Topics  . Smoking status: Former Smoker    Packs/day: 0.50    Years: 15.00    Types: Cigarettes    Quit date: 03/27/2016  . Smokeless tobacco: Never Used  . Alcohol use Yes     Comment: occasional now    ALLERGIES:  has No Known Allergies.  MEDICATIONS:  Current Outpatient Prescriptions  Medication Sig Dispense Refill  .  dexamethasone (DECADRON) 4 MG tablet 2 tab (52m) twice a day for 3 days starting on day of chemotherapy 30 tablet 3  . guaiFENesin (MUCINEX) 600 MG 12 hr tablet Take 1,200 mg by mouth 2 (two) times daily as needed for cough or to loosen phlegm.    . labetalol (NORMODYNE) 100 MG tablet TK 1 T PO  BID  6  . levalbuterol (XOPENEX HFA) 45 MCG/ACT inhaler Inhale 2 puffs into the lungs every 6 (six) hours as needed for wheezing. 1 Inhaler 12  . lidocaine-prilocaine (EMLA) cream Apply 1 application topically as needed.    . loratadine (CLARITIN) 10 MG tablet Take 1 tablet (10 mg total) by mouth daily. For up to 5 days following Neulasta injection. 5 tablet 2  . metoprolol (LOPRESSOR) 50 MG tablet Take 1 tablet (50 mg total) by mouth 2 (two) times daily. 60 tablet 0  . ondansetron (ZOFRAN) 8 MG tablet Take 1 tablet (8 mg total) by mouth every 8 (eight) hours as needed for nausea (start on the 3rd day after chemotherapy). 30 tablet 3  . prochlorperazine (COMPAZINE) 10 MG tablet Take 1 tablet (10 mg total) by mouth every 6 (six) hours as needed for nausea or vomiting. 30 tablet 3   No current facility-administered medications for this visit.    Facility-Administered Medications Ordered in Other Visits  Medication Dose Route Frequency Provider Last Rate Last Dose  . dacarbazine (DTIC) 700 mg in sodium chloride 0.9 % 250 mL chemo  infusion  390 mg/m2 (Order-Specific) Intravenous Once Brunetta Genera, MD 285 mL/hr at 09/03/16 1254 700 mg at 09/03/16 1254  . heparin lock flush 100 unit/mL  500 Units Intracatheter Once PRN Brunetta Genera, MD      . pegfilgrastim (NEULASTA ONPRO KIT) injection 6 mg  6 mg Subcutaneous Once Brunetta Genera, MD      . sodium chloride flush (NS) 0.9 % injection 10 mL  10 mL Intracatheter PRN Kipling Graser Juleen China, MD        PHYSICAL EXAMINATION: ECOG PERFORMANCE STATUS: 1 - Symptomatic but completely ambulatory  . Vitals:   09/03/16 1017  BP: 132/78  Pulse: 85    Resp: 16  Temp: 99.1 F (37.3 C)    Filed Weights   09/03/16 1017  Weight: 155 lb 1.6 oz (70.4 kg)   .Body mass index is 25.03 kg/m.  OROPHARYNX: Moist mucous membranes No thrush NECK: supple, no JVD LYMPH:No palpable cervical , axillary or inguinal LNadenoapthy LUNGS:  improved air entry and with no rales and only scattered rhonci HEART: regular rate & rhythm ABDOMEN: abdomen soft, nontender, normoactive bowel sounds Extremities no edema  NEURO: Awake alert and oriented 3, moving all 4 extremities. No overt focal neurological deficit.  LABORATORY DATA:   I have reviewed the data as listed  . CBC Latest Ref Rng & Units 09/03/2016 08/02/2016 07/16/2016  WBC 3.9 - 10.3 10e3/uL 7.4 3.7(L) 4.8  Hemoglobin 11.6 - 15.9 g/dL 13.3 12.4 13.4  Hematocrit 34.8 - 46.6 % 39.8 37.0 40.2  Platelets 145 - 400 10e3/uL 323 260 247    . CMP Latest Ref Rng & Units 09/03/2016 08/02/2016 07/16/2016  Glucose 70 - 140 mg/dl 110 67(L) 76  BUN 7.0 - 26.0 mg/dL 6.9(L) 9.3 7.9  Creatinine 0.6 - 1.1 mg/dL 0.7 0.7 0.8  Sodium 136 - 145 mEq/L 139 141 141  Potassium 3.5 - 5.1 mEq/L 4.0 4.9 4.2  Chloride 101 - 111 mmol/L - - -  CO2 22 - 29 mEq/L 22 23 23   Calcium 8.4 - 10.4 mg/dL 9.3 9.6 9.9  Total Protein 6.4 - 8.3 g/dL 7.4 7.2 7.5  Total Bilirubin 0.20 - 1.20 mg/dL 0.32 0.45 0.49  Alkaline Phos 40 - 150 U/L 156(H) 124 132  AST 5 - 34 U/L 27 21 14   ALT 0 - 55 U/L 20 13 <9    RADIOGRAPHIC STUDIES: I have personally reviewed the radiological images as listed and agreed with the findings in the report. No results found.  ASSESSMENT & PLAN:   30 year old African-American female with  #1 Mixed cellularity Hodgkin's lymphoma IV B E with extensive lymphadenopathy including right axillary, mediastinal and upper retroperitoneal and now biopsy proven pulmonary involvement. She was noted to have significant constitutional symptoms including significant weight loss, fevers chills and some night sweats. HIV  negative Hepatitis C and hepatitis B serologies negative. Echo with normal ejection fraction. Patient is here for follow-up prior to her cycle 5 day 1 AVD (bleomycin on hold due to lung compromise) DLCO on her pulmonary function tests are 37 percent suggesting severe reduction.  #2 hypoxic respiratory failure with dense right lung consolidation and left upper lobe consolidation. Pulmonary biopsy +ve for CHL - improved. Currently off oxygen. Breathing continues to improve Appears to have some chronic hypercapnia.  #3 ABdominal distension -resolved Plan Patient is doing well with no prohibitive toxicities. Labs currently stable. -She is appropriate to proceed with C5 D1  AVD ( without bleomycin) with Neulasta support given  2 hospitalizations for possible pneumonia. -Patient missed her dose of chemotherapy scheduled for 08/20/2016. She was counseled strongly against delaying or skipping chemotherapy since this has a direct bearing on the curative potential of her chemotherapy.  She notes that she understands this and she'll avoid missing treatment in the future . -We shall see her back in 1 month as she starts her sixth and final cycle of treatment on cycle 6 day 1 . -We will plan to repeat a PET/CT scan after completion of her 6 cycles of treatment. -Since the patient is still smoking and the pulmonary function testing shows severe reduction in DLCO we shall continue to hold her bleomycin at this time. -She was again counseled regarding absolute smoking cessation   Return to care with Dr. Irene Limbo in 4 weeks with CBC CMP prior to cycle 6  D1 with labs  I spent 25 minutes counseling the patient face to face. The total time spent in the appointment was 25 minutes and more than 50% was on counseling and direct patient cares.    Heidi Lone MD [place] AAHIVMS [facility] Hematology/Oncology Physician [facility]  (Office):       [phone] (Work cell):  [phone] (Fax):            [phone]

## 2016-09-03 NOTE — Patient Instructions (Signed)
[facility] Discharge Instructions for Patients Receiving Chemotherapy  Today you received the following chemotherapy agents Adriamycin/ Velban/ DTIC  To help prevent nausea and vomiting after your treatment, we encourage you to take your nausea medication   If you develop nausea and vomiting that is not controlled by your nausea medication, call the clinic.   BELOW ARE SYMPTOMS THAT SHOULD BE REPORTED IMMEDIATELY:  *FEVER GREATER THAN 100.5 F  *CHILLS WITH OR WITHOUT FEVER  NAUSEA AND VOMITING THAT IS NOT CONTROLLED WITH YOUR NAUSEA MEDICATION  *UNUSUAL SHORTNESS OF BREATH  *UNUSUAL BRUISING OR BLEEDING  TENDERNESS IN MOUTH AND THROAT WITH OR WITHOUT PRESENCE OF ULCERS  *URINARY PROBLEMS  *BOWEL PROBLEMS  UNUSUAL RASH Items with * indicate a potential emergency and should be followed up as soon as possible.  Feel free to call the clinic you have any questions or concerns. The clinic phone number is [phone].  Please show the [place] at check-in to the Emergency Department and triage nurse.

## 2016-09-04 LAB — SEDIMENTATION RATE: SED RATE: 29 mm/h (ref 0–32)

## 2016-09-16 ENCOUNTER — Other Ambulatory Visit: Payer: Self-pay | Admitting: *Deleted

## 2016-09-16 DIAGNOSIS — C8120 Mixed cellularity classical Hodgkin lymphoma, unspecified site: Secondary | ICD-10-CM

## 2016-09-17 ENCOUNTER — Ambulatory Visit: Payer: Self-pay

## 2016-09-17 ENCOUNTER — Other Ambulatory Visit: Payer: Self-pay

## 2016-09-20 ENCOUNTER — Telehealth: Payer: Self-pay | Admitting: Hematology

## 2016-09-20 NOTE — Telephone Encounter (Signed)
lvm to inform pt of r/s appt to 9/27 per LOS

## 2016-09-22 ENCOUNTER — Other Ambulatory Visit: Payer: Self-pay

## 2016-09-22 ENCOUNTER — Ambulatory Visit: Payer: Self-pay

## 2016-09-23 ENCOUNTER — Telehealth: Payer: Self-pay | Admitting: Hematology

## 2016-09-23 ENCOUNTER — Encounter: Payer: Self-pay | Admitting: *Deleted

## 2016-09-23 NOTE — Telephone Encounter (Signed)
sw pt to confirm 9/29 appt date/time per LOS

## 2016-09-23 NOTE — Progress Notes (Signed)
Called patient about missed appointment. Patient states that she can come 9/29 depending on transportation. LOS sent to schedulers.

## 2016-09-24 ENCOUNTER — Other Ambulatory Visit: Payer: Self-pay

## 2016-09-24 ENCOUNTER — Ambulatory Visit: Payer: Self-pay

## 2016-09-28 ENCOUNTER — Telehealth: Payer: Self-pay | Admitting: *Deleted

## 2016-09-28 NOTE — Telephone Encounter (Signed)
Late entry----received message from scheduler to move patients appt from last Friday to this week on 10/4, only day patient can come and needed morning. Sent message back and scheduled aappt only available is 1215 and to move labs

## 2016-09-29 ENCOUNTER — Ambulatory Visit (HOSPITAL_BASED_OUTPATIENT_CLINIC_OR_DEPARTMENT_OTHER): Payer: Self-pay

## 2016-09-29 ENCOUNTER — Other Ambulatory Visit (HOSPITAL_BASED_OUTPATIENT_CLINIC_OR_DEPARTMENT_OTHER): Payer: Self-pay

## 2016-09-29 VITALS — BP 121/68 | HR 78 | Temp 98.7°F | Resp 16

## 2016-09-29 DIAGNOSIS — Z452 Encounter for adjustment and management of vascular access device: Secondary | ICD-10-CM

## 2016-09-29 DIAGNOSIS — Z5111 Encounter for antineoplastic chemotherapy: Secondary | ICD-10-CM

## 2016-09-29 DIAGNOSIS — C8120 Mixed cellularity classical Hodgkin lymphoma, unspecified site: Secondary | ICD-10-CM

## 2016-09-29 DIAGNOSIS — C8128 Mixed cellularity classical Hodgkin lymphoma, lymph nodes of multiple sites: Secondary | ICD-10-CM

## 2016-09-29 DIAGNOSIS — Z5189 Encounter for other specified aftercare: Secondary | ICD-10-CM

## 2016-09-29 LAB — CBC & DIFF AND RETIC
BASO%: 0.2 % (ref 0.0–2.0)
Basophils Absolute: 0 10*3/uL (ref 0.0–0.1)
EOS ABS: 0 10*3/uL (ref 0.0–0.5)
EOS%: 0.5 % (ref 0.0–7.0)
HEMATOCRIT: 35.5 % (ref 34.8–46.6)
HGB: 11.8 g/dL (ref 11.6–15.9)
IMMATURE RETIC FRACT: 5.3 % (ref 1.60–10.00)
LYMPH#: 0.5 10*3/uL — AB (ref 0.9–3.3)
LYMPH%: 8.8 % — ABNORMAL LOW (ref 14.0–49.7)
MCH: 31.1 pg (ref 25.1–34.0)
MCHC: 33.2 g/dL (ref 31.5–36.0)
MCV: 93.4 fL (ref 79.5–101.0)
MONO#: 0.7 10*3/uL (ref 0.1–0.9)
MONO%: 12 % (ref 0.0–14.0)
NEUT%: 78.5 % — AB (ref 38.4–76.8)
NEUTROS ABS: 4.4 10*3/uL (ref 1.5–6.5)
PLATELETS: 308 10*3/uL (ref 145–400)
RBC: 3.8 10*6/uL (ref 3.70–5.45)
RDW: 13.6 % (ref 11.2–14.5)
RETIC CT ABS: 57.38 10*3/uL (ref 33.70–90.70)
Retic %: 1.51 % (ref 0.70–2.10)
WBC: 5.6 10*3/uL (ref 3.9–10.3)

## 2016-09-29 LAB — COMPREHENSIVE METABOLIC PANEL
ALT: 13 U/L (ref 0–55)
AST: 15 U/L (ref 5–34)
Albumin: 3 g/dL — ABNORMAL LOW (ref 3.5–5.0)
Alkaline Phosphatase: 111 U/L (ref 40–150)
Anion Gap: 11 mEq/L (ref 3–11)
BILIRUBIN TOTAL: 0.69 mg/dL (ref 0.20–1.20)
BUN: 4.3 mg/dL — AB (ref 7.0–26.0)
CALCIUM: 8.9 mg/dL (ref 8.4–10.4)
CHLORIDE: 101 meq/L (ref 98–109)
CO2: 24 meq/L (ref 22–29)
CREATININE: 0.7 mg/dL (ref 0.6–1.1)
EGFR: >90 mL/min/{1.73_m2} (ref >90)
Glucose: 141 mg/dl — ABNORMAL HIGH (ref 70–140)
Potassium: 3.7 mEq/L (ref 3.5–5.1)
Sodium: 135 mEq/L — ABNORMAL LOW (ref 136–145)
TOTAL PROTEIN: 6.3 g/dL — AB (ref 6.4–8.3)

## 2016-09-29 MED ORDER — SODIUM CHLORIDE 0.9 % IV SOLN
390.0000 mg/m2 | Freq: Once | INTRAVENOUS | Status: AC
  Administered 2016-09-29: 700 mg via INTRAVENOUS
  Filled 2016-09-29: qty 35

## 2016-09-29 MED ORDER — DOXORUBICIN HCL CHEMO IV INJECTION 2 MG/ML
25.0000 mg/m2 | Freq: Once | INTRAVENOUS | Status: AC
  Administered 2016-09-29: 44 mg via INTRAVENOUS
  Filled 2016-09-29: qty 22

## 2016-09-29 MED ORDER — PEGFILGRASTIM 6 MG/0.6ML ~~LOC~~ PSKT
6.0000 mg | PREFILLED_SYRINGE | Freq: Once | SUBCUTANEOUS | Status: AC
  Administered 2016-09-29: 6 mg via SUBCUTANEOUS
  Filled 2016-09-29: qty 0.6

## 2016-09-29 MED ORDER — HEPARIN SOD (PORK) LOCK FLUSH 100 UNIT/ML IV SOLN
500.0000 [IU] | Freq: Once | INTRAVENOUS | Status: AC | PRN
  Administered 2016-09-29: 500 [IU]
  Filled 2016-09-29: qty 5

## 2016-09-29 MED ORDER — PALONOSETRON HCL INJECTION 0.25 MG/5ML
0.2500 mg | Freq: Once | INTRAVENOUS | Status: AC
Start: 2016-09-29 — End: 2016-09-29
  Administered 2016-09-29: 0.25 mg via INTRAVENOUS

## 2016-09-29 MED ORDER — VINBLASTINE SULFATE CHEMO INJECTION 1 MG/ML
6.2000 mg/m2 | Freq: Once | INTRAVENOUS | Status: AC
  Administered 2016-09-29: 11 mg via INTRAVENOUS
  Filled 2016-09-29: qty 11

## 2016-09-29 MED ORDER — SODIUM CHLORIDE 0.9% FLUSH
10.0000 mL | INTRAVENOUS | Status: DC | PRN
  Administered 2016-09-29: 10 mL
  Filled 2016-09-29: qty 10

## 2016-09-29 MED ORDER — SODIUM CHLORIDE 0.9 % IV SOLN
Freq: Once | INTRAVENOUS | Status: AC
  Administered 2016-09-29: 15:00:00 via INTRAVENOUS
  Filled 2016-09-29: qty 5

## 2016-09-29 MED ORDER — PALONOSETRON HCL INJECTION 0.25 MG/5ML
INTRAVENOUS | Status: AC
  Filled 2016-09-29: qty 5

## 2016-09-29 MED ORDER — ALTEPLASE 2 MG IJ SOLR
2.0000 mg | Freq: Once | INTRAMUSCULAR | Status: AC | PRN
  Administered 2016-09-29: 2 mg
  Filled 2016-09-29: qty 2

## 2016-09-29 MED ORDER — SODIUM CHLORIDE 0.9 % IV SOLN
Freq: Once | INTRAVENOUS | Status: AC
  Administered 2016-09-29: 15:00:00 via INTRAVENOUS

## 2016-09-29 NOTE — Progress Notes (Signed)
Right port accessed at 1315, unable to obtain blood return after multiple attempts and flushes. Placement confirmed via report on 04/2016 port in SVC. Activase was administered into port. 14min check, no blood return noted, 58min check minimal blood return noted, scanty amount, Activase returned to port and RN will recheck.

## 2016-09-30 LAB — SEDIMENTATION RATE: Sedimentation Rate-Westergren: 25 mm/hr (ref 0–32)

## 2016-10-01 ENCOUNTER — Other Ambulatory Visit: Payer: Self-pay | Admitting: Hematology

## 2016-10-01 ENCOUNTER — Ambulatory Visit: Payer: Self-pay | Admitting: Hematology

## 2016-10-01 ENCOUNTER — Other Ambulatory Visit: Payer: Self-pay

## 2016-10-01 ENCOUNTER — Ambulatory Visit: Payer: Self-pay

## 2016-10-01 ENCOUNTER — Other Ambulatory Visit: Payer: Self-pay | Admitting: *Deleted

## 2016-10-05 ENCOUNTER — Encounter: Payer: Self-pay | Admitting: Hematology

## 2016-10-05 NOTE — Progress Notes (Signed)
Because pt is uninsured she gave me consent to apply to [place] for drug replacement for Neulasta.  [employer] denied pt because she does not currently meet the foundation eligibility criteria because she appears eligible for Medicaid.  [employer] notified pt by letter about this decision.  Per Holli Humbles (hospital FA), her Medicaid application was denied due to pt not complying with child support.  Per Johnna Acosta (hospital customer service rep), if pt does not comply w/ Medicaid and complete the process she cannot apply for a discount thru the hospital either.

## 2016-10-08 ENCOUNTER — Ambulatory Visit: Payer: Self-pay

## 2016-10-08 ENCOUNTER — Other Ambulatory Visit: Payer: Self-pay

## 2016-10-08 ENCOUNTER — Ambulatory Visit: Payer: Self-pay | Admitting: Hematology

## 2016-10-14 ENCOUNTER — Encounter: Payer: Self-pay | Admitting: *Deleted

## 2016-10-14 ENCOUNTER — Ambulatory Visit: Payer: Self-pay | Admitting: Hematology

## 2016-10-14 ENCOUNTER — Ambulatory Visit: Payer: Self-pay

## 2016-10-14 ENCOUNTER — Other Ambulatory Visit: Payer: Self-pay

## 2016-10-14 NOTE — Progress Notes (Signed)
Missed appointment today due to transportation issues. Sent message to schedulers to reschedule appointments

## 2016-10-19 ENCOUNTER — Ambulatory Visit (HOSPITAL_BASED_OUTPATIENT_CLINIC_OR_DEPARTMENT_OTHER): Payer: Self-pay

## 2016-10-19 ENCOUNTER — Ambulatory Visit (HOSPITAL_BASED_OUTPATIENT_CLINIC_OR_DEPARTMENT_OTHER): Payer: Self-pay | Admitting: Hematology

## 2016-10-19 ENCOUNTER — Other Ambulatory Visit (HOSPITAL_BASED_OUTPATIENT_CLINIC_OR_DEPARTMENT_OTHER): Payer: Self-pay

## 2016-10-19 ENCOUNTER — Encounter: Payer: Self-pay | Admitting: Hematology

## 2016-10-19 VITALS — BP 136/87 | HR 110 | Temp 99.5°F | Resp 18 | Ht 66.0 in | Wt 144.0 lb

## 2016-10-19 DIAGNOSIS — J9612 Chronic respiratory failure with hypercapnia: Secondary | ICD-10-CM

## 2016-10-19 DIAGNOSIS — Z5111 Encounter for antineoplastic chemotherapy: Secondary | ICD-10-CM

## 2016-10-19 DIAGNOSIS — Z720 Tobacco use: Secondary | ICD-10-CM

## 2016-10-19 DIAGNOSIS — F17200 Nicotine dependence, unspecified, uncomplicated: Secondary | ICD-10-CM

## 2016-10-19 DIAGNOSIS — C8128 Mixed cellularity classical Hodgkin lymphoma, lymph nodes of multiple sites: Secondary | ICD-10-CM

## 2016-10-19 DIAGNOSIS — Z5189 Encounter for other specified aftercare: Secondary | ICD-10-CM

## 2016-10-19 DIAGNOSIS — C8100 Nodular lymphocyte predominant Hodgkin lymphoma, unspecified site: Secondary | ICD-10-CM

## 2016-10-19 DIAGNOSIS — Z452 Encounter for adjustment and management of vascular access device: Secondary | ICD-10-CM

## 2016-10-19 LAB — CBC & DIFF AND RETIC
BASO%: 0.1 % (ref 0.0–2.0)
BASOS ABS: 0 10*3/uL (ref 0.0–0.1)
EOS ABS: 0 10*3/uL (ref 0.0–0.5)
EOS%: 0.2 % (ref 0.0–7.0)
HEMATOCRIT: 36.8 % (ref 34.8–46.6)
HEMOGLOBIN: 12.2 g/dL (ref 11.6–15.9)
IMMATURE RETIC FRACT: 7.7 % (ref 1.60–10.00)
LYMPH#: 0.5 10*3/uL — AB (ref 0.9–3.3)
LYMPH%: 4.6 % — ABNORMAL LOW (ref 14.0–49.7)
MCH: 30.5 pg (ref 25.1–34.0)
MCHC: 33.2 g/dL (ref 31.5–36.0)
MCV: 92 fL (ref 79.5–101.0)
MONO#: 1 10*3/uL — ABNORMAL HIGH (ref 0.1–0.9)
MONO%: 8.8 % (ref 0.0–14.0)
NEUT#: 10.1 10*3/uL — ABNORMAL HIGH (ref 1.5–6.5)
NEUT%: 86.3 % — AB (ref 38.4–76.8)
Platelets: 609 10*3/uL — ABNORMAL HIGH (ref 145–400)
RBC: 4 10*6/uL (ref 3.70–5.45)
RDW: 13.7 % (ref 11.2–14.5)
RETIC %: 1.59 % (ref 0.70–2.10)
RETIC CT ABS: 63.6 10*3/uL (ref 33.70–90.70)
WBC: 11.7 10*3/uL — ABNORMAL HIGH (ref 3.9–10.3)

## 2016-10-19 LAB — COMPREHENSIVE METABOLIC PANEL
ALK PHOS: 98 U/L (ref 40–150)
ALT: 10 U/L (ref 0–55)
AST: 14 U/L (ref 5–34)
Albumin: 2.9 g/dL — ABNORMAL LOW (ref 3.5–5.0)
Anion Gap: 12 mEq/L — ABNORMAL HIGH (ref 3–11)
BILIRUBIN TOTAL: 0.58 mg/dL (ref 0.20–1.20)
BUN: 5.2 mg/dL — AB (ref 7.0–26.0)
CALCIUM: 9.2 mg/dL (ref 8.4–10.4)
CHLORIDE: 99 meq/L (ref 98–109)
CO2: 24 mEq/L (ref 22–29)
CREATININE: 0.7 mg/dL (ref 0.6–1.1)
EGFR: >90 mL/min/{1.73_m2} (ref >90)
Glucose: 108 mg/dl (ref 70–140)
Potassium: 4 mEq/L (ref 3.5–5.1)
Sodium: 135 mEq/L — ABNORMAL LOW (ref 136–145)
TOTAL PROTEIN: 6.6 g/dL (ref 6.4–8.3)

## 2016-10-19 MED ORDER — SODIUM CHLORIDE 0.9 % IV SOLN
390.0000 mg/m2 | Freq: Once | INTRAVENOUS | Status: AC
  Administered 2016-10-19: 700 mg via INTRAVENOUS
  Filled 2016-10-19: qty 35

## 2016-10-19 MED ORDER — SODIUM CHLORIDE 0.9 % IV SOLN
Freq: Once | INTRAVENOUS | Status: AC
  Administered 2016-10-19: 13:00:00 via INTRAVENOUS
  Filled 2016-10-19: qty 5

## 2016-10-19 MED ORDER — SODIUM CHLORIDE 0.9% FLUSH
10.0000 mL | INTRAVENOUS | Status: DC | PRN
  Administered 2016-10-19: 10 mL
  Filled 2016-10-19: qty 10

## 2016-10-19 MED ORDER — HEPARIN SOD (PORK) LOCK FLUSH 100 UNIT/ML IV SOLN
500.0000 [IU] | Freq: Once | INTRAVENOUS | Status: AC | PRN
  Administered 2016-10-19: 500 [IU]
  Filled 2016-10-19: qty 5

## 2016-10-19 MED ORDER — DOXORUBICIN HCL CHEMO IV INJECTION 2 MG/ML
25.0000 mg/m2 | Freq: Once | INTRAVENOUS | Status: AC
Start: 2016-10-19 — End: 2016-10-19
  Administered 2016-10-19: 44 mg via INTRAVENOUS
  Filled 2016-10-19: qty 22

## 2016-10-19 MED ORDER — VINBLASTINE SULFATE CHEMO INJECTION 1 MG/ML
6.2000 mg/m2 | Freq: Once | INTRAVENOUS | Status: AC
  Administered 2016-10-19: 11 mg via INTRAVENOUS
  Filled 2016-10-19: qty 11

## 2016-10-19 MED ORDER — PEGFILGRASTIM 6 MG/0.6ML ~~LOC~~ PSKT
6.0000 mg | PREFILLED_SYRINGE | Freq: Once | SUBCUTANEOUS | Status: AC
  Administered 2016-10-19: 6 mg via SUBCUTANEOUS
  Filled 2016-10-19: qty 0.6

## 2016-10-19 MED ORDER — PALONOSETRON HCL INJECTION 0.25 MG/5ML
INTRAVENOUS | Status: AC
  Filled 2016-10-19: qty 5

## 2016-10-19 MED ORDER — SODIUM CHLORIDE 0.9 % IV SOLN
Freq: Once | INTRAVENOUS | Status: AC
  Administered 2016-10-19: 12:00:00 via INTRAVENOUS

## 2016-10-19 MED ORDER — ALTEPLASE 2 MG IJ SOLR
2.0000 mg | Freq: Once | INTRAMUSCULAR | Status: AC | PRN
Start: 2016-10-19 — End: 2016-10-19
  Administered 2016-10-19: 2 mg
  Filled 2016-10-19: qty 2

## 2016-10-19 MED ORDER — PALONOSETRON HCL INJECTION 0.25 MG/5ML
0.2500 mg | Freq: Once | INTRAVENOUS | Status: AC
  Administered 2016-10-19: 0.25 mg via INTRAVENOUS

## 2016-10-19 NOTE — Patient Instructions (Signed)
  [facility] Discharge Instructions for Patients Receiving Chemotherapy  Today you received the following chemotherapy agents Adriamycin/Velban/ DTIC To help prevent nausea and vomiting after your treatment, we encourage you to take your nausea medication as prescribed.   If you develop nausea and vomiting that is not controlled by your nausea medication, call the clinic.   BELOW ARE SYMPTOMS THAT SHOULD BE REPORTED IMMEDIATELY:  *FEVER GREATER THAN 100.5 F  *CHILLS WITH OR WITHOUT FEVER  NAUSEA AND VOMITING THAT IS NOT CONTROLLED WITH YOUR NAUSEA MEDICATION  *UNUSUAL SHORTNESS OF BREATH  *UNUSUAL BRUISING OR BLEEDING  TENDERNESS IN MOUTH AND THROAT WITH OR WITHOUT PRESENCE OF ULCERS  *URINARY PROBLEMS  *BOWEL PROBLEMS  UNUSUAL RASH Items with * indicate a potential emergency and should be followed up as soon as possible.  Feel free to call the clinic you have any questions or concerns. The clinic phone number is [phone].  Please show the [place] at check-in to the Emergency Department and triage nurse.

## 2016-10-19 NOTE — Progress Notes (Signed)
Patient port accessed. Flushes easily. No blood return noted.  [place] into port.   1220 Positive blood return noted. Chemotherapy released.

## 2016-10-20 LAB — SEDIMENTATION RATE: Sedimentation Rate-Westergren: 34 mm/hr — ABNORMAL HIGH (ref 0–32)

## 2016-10-22 ENCOUNTER — Ambulatory Visit: Payer: Self-pay

## 2016-10-22 ENCOUNTER — Ambulatory Visit: Payer: Self-pay | Admitting: Hematology

## 2016-10-22 ENCOUNTER — Other Ambulatory Visit: Payer: Self-pay

## 2016-10-26 ENCOUNTER — Encounter: Payer: Self-pay | Admitting: *Deleted

## 2016-10-26 NOTE — Progress Notes (Signed)
QI encounter 

## 2016-10-31 NOTE — Progress Notes (Signed)
Heidi [name]  HEMATOLOGY ONCOLOGY PROGRESS NOTE  Date of service: .10/19/2016   Patient Care Team: Maren Reamer, MD as PCP - General (Internal Medicine)  Chief complaint: Follow-up for Hodgkin's lymphoma  Diagnosis: Mixed cellularity Hodgkin's lymphoma IVBE with extensive lymphadenopathy including right axillary, mediastinal and upper retroperitoneal and now biopsy proven pulmonary involvement. She was noted to have significant constitutional symptoms including significant weight loss, fevers chills and some night sweats.  Current Treatment:  Getting cycle 6 D 1 of AVD (without bleomycin due to lung involvement and DLCO of 37%, active smoker) Multiple avoidable treatment delays due to the patient's noncompliance with follow-up for avoidable reasons. She has been counseled repeatedly that this would increase the likelihood of unfavorable outcome.  INTERVAL HISTORY:  Ms Moncion is here for follow-up of her Hodgkin's lymphoma prior to her cycle6 D1 of AVD. She was supposed to follow up for her cycle 6 day 1 chemotherapy . she has had multiple treatment delays for avoidable reasons. She has been made aware on several occasions that this might lead to unfavorable treatment outcomes.   reports no acute toxicities. No fevers or chills. No new enlarged lymph nodes.  Still smoking a few cigarettes a day.   REVIEW OF SYSTEMS:    10 Point review of systems of done and is negative except as noted above.  . Past Medical History:  Diagnosis Date  . Asthma   . Hodgkin lymphoma ([place])   . Hypertension     . Past Surgical History:  Procedure Laterality Date  . AXILLARY LYMPH NODE BIOPSY Right 03/19/2016   Procedure: AXILLARY LYMPH NODE BIOPSY;  Surgeon: Armandina Gemma, MD;  Location: [facility];  Service: General;  Laterality: Right;    . Social History  Substance Use Topics  . Smoking status: Former Smoker    Packs/day: 0.50    Years: 15.00    Types: Cigarettes    Quit date: 03/27/2016  . Smokeless  tobacco: Never Used  . Alcohol use Yes     Comment: occasional now    ALLERGIES:  has No Known Allergies.  MEDICATIONS:  Current Outpatient Prescriptions  Medication Sig Dispense Refill  . dexamethasone (DECADRON) 4 MG tablet 2 tab (8mg ) twice a day for 3 days starting on day of chemotherapy 30 tablet 3  . guaiFENesin (MUCINEX) 600 MG 12 hr tablet Take 1,200 mg by mouth 2 (two) times daily as needed for cough or to loosen phlegm.    . labetalol (NORMODYNE) 100 MG tablet TK 1 T PO  BID  6  . levalbuterol (XOPENEX HFA) 45 MCG/ACT inhaler Inhale 2 puffs into the lungs every 6 (six) hours as needed for wheezing. 1 Inhaler 12  . lidocaine-prilocaine (EMLA) cream Apply 1 application topically as needed.    . loratadine (CLARITIN) 10 MG tablet Take 1 tablet (10 mg total) by mouth daily. For up to 5 days following Neulasta injection. 5 tablet 2  . metoprolol (LOPRESSOR) 50 MG tablet Take 1 tablet (50 mg total) by mouth 2 (two) times daily. 60 tablet 0  . ondansetron (ZOFRAN) 8 MG tablet Take 1 tablet (8 mg total) by mouth every 8 (eight) hours as needed for nausea (start on the 3rd day after chemotherapy). 30 tablet 3  . prochlorperazine (COMPAZINE) 10 MG tablet Take 1 tablet (10 mg total) by mouth every 6 (six) hours as needed for nausea or vomiting. 30 tablet 3   No current facility-administered medications for this visit.     PHYSICAL EXAMINATION: ECOG  PERFORMANCE STATUS: 1 - Symptomatic but completely ambulatory  . Vitals:   10/19/16 0924  BP: 136/87  Pulse: (!) 110  Resp: 18  Temp: 99.5 F (37.5 C)    Filed Weights   10/19/16 0924  Weight: 144 lb (65.3 kg)   .Body mass index is 23.24 kg/m.  OROPHARYNX: Moist mucous membranes No thrush NECK: supple, no JVD LYMPH:No palpable cervical , axillary or inguinal LNadenoapthy LUNGS:  improved air entry and with no rales and only scattered rhonci HEART: regular rate & rhythm ABDOMEN: abdomen soft, nontender, normoactive bowel  sounds Extremities no edema  NEURO: Awake alert and oriented 3, moving all 4 extremities. No overt focal neurological deficit.  LABORATORY DATA:   I have reviewed the data as listed  . CBC Latest Ref Rng & Units 10/19/2016 09/29/2016 09/03/2016  WBC 3.9 - 10.3 10e3/uL 11.7(H) 5.6 7.4  Hemoglobin 11.6 - 15.9 g/dL 12.2 11.8 13.3  Hematocrit 34.8 - 46.6 % 36.8 35.5 39.8  Platelets 145 - 400 10e3/uL 609(H) 308 323    . CMP Latest Ref Rng & Units 10/19/2016 09/29/2016 09/03/2016  Glucose 70 - 140 mg/dl 108 141(H) 110  BUN 7.0 - 26.0 mg/dL 5.2(L) 4.3(L) 6.9(L)  Creatinine 0.6 - 1.1 mg/dL 0.7 0.7 0.7  Sodium 136 - 145 mEq/L 135(L) 135(L) 139  Potassium 3.5 - 5.1 mEq/L 4.0 3.7 4.0  Chloride 101 - 111 mmol/L - - -  CO2 22 - 29 mEq/L 24 24 22   Calcium 8.4 - 10.4 mg/dL 9.2 8.9 9.3  Total Protein 6.4 - 8.3 g/dL 6.6 6.3(L) 7.4  Total Bilirubin 0.20 - 1.20 mg/dL 0.58 0.69 0.32  Alkaline Phos 40 - 150 U/L 98 111 156(H)  AST 5 - 34 U/L 14 15 27   ALT 0 - 55 U/L 10 13 20     RADIOGRAPHIC STUDIES: I have personally reviewed the radiological images as listed and agreed with the findings in the report. No results found.  ASSESSMENT & PLAN:   30 year old African-American female with  #1 Mixed cellularity Hodgkin's lymphoma IV B E with extensive lymphadenopathy including right axillary, mediastinal and upper retroperitoneal and now biopsy proven pulmonary involvement. She was noted to have significant constitutional symptoms including significant weight loss, fevers chills and some night sweats. HIV negative Hepatitis C and hepatitis B serologies negative. Echo with normal ejection fraction. Patient is here for follow-up prior to her cycle 6 day 1 AVD (bleomycin on hold due to lung compromise) DLCO on her pulmonary function tests are 37 percent suggesting severe reduction.  #2 hypoxic respiratory failure with dense right lung consolidation and left upper lobe consolidation. Pulmonary biopsy +ve  for CHL - improved. Currently off oxygen. Breathing continues to improve Appears to have some chronic hypercapnia.  #3 ABdominal distension -resolved Plan Patient is doing well with no prohibitive toxicities. Labs currently stable. -She is appropriate to proceed with C6 D1  AVD ( without bleomycin) with Neulasta support given 2 hospitalizations for possible pneumonia. -We will plan to repeat a PET/CT scan after completion of her 6 cycles of treatment. -She was again counseled regarding absolute smoking cessation   Return to care with Dr. Irene Limbo in 4-5 weeks with CBC CMP and re-staging PET/CT after completion of her 6th and final planned cycle of treatment  I spent 25 minutes counseling the patient face to face. The total time spent in the appointment was 25 minutes and more than 50% was on counseling and direct patient cares.    Sullivan Lone MD MS  AAHIVMS [facility] Hematology/Oncology Physician [place]  (Office):       [phone] (Work cell):  [phone] (Fax):           [phone]

## 2016-11-01 ENCOUNTER — Other Ambulatory Visit: Payer: Self-pay | Admitting: *Deleted

## 2016-11-01 DIAGNOSIS — C8128 Mixed cellularity classical Hodgkin lymphoma, lymph nodes of multiple sites: Secondary | ICD-10-CM

## 2016-11-02 ENCOUNTER — Telehealth: Payer: Self-pay | Admitting: Hematology

## 2016-11-02 ENCOUNTER — Other Ambulatory Visit: Payer: Self-pay

## 2016-11-02 ENCOUNTER — Ambulatory Visit: Payer: Self-pay

## 2016-11-02 NOTE — Telephone Encounter (Signed)
Spoke with patient re 11/9 appointment - patient to get new schedule 11/9.

## 2016-11-04 ENCOUNTER — Other Ambulatory Visit: Payer: Self-pay

## 2016-11-04 ENCOUNTER — Ambulatory Visit: Payer: Self-pay

## 2016-11-05 ENCOUNTER — Encounter: Payer: Self-pay | Admitting: *Deleted

## 2016-11-05 NOTE — Progress Notes (Signed)
This RN was advised that pt missed apt on 11//7 & 11/9.  Reviewed with Dr. Irene Limbo.  Per MD, scheduling message sent on 11/10 as high priority to reschedule with pt ASAP.

## 2016-11-08 ENCOUNTER — Telehealth: Payer: Self-pay | Admitting: Hematology

## 2016-11-08 NOTE — Telephone Encounter (Signed)
sw pt to confirm r/s infusion appt to 11/16 per LOS

## 2016-11-09 ENCOUNTER — Other Ambulatory Visit: Payer: Self-pay

## 2016-11-11 ENCOUNTER — Ambulatory Visit: Payer: Self-pay

## 2016-11-11 ENCOUNTER — Telehealth: Payer: Self-pay | Admitting: *Deleted

## 2016-11-11 ENCOUNTER — Other Ambulatory Visit: Payer: Self-pay

## 2016-11-11 NOTE — Telephone Encounter (Signed)
Per staff message I have moved appat from today to next week

## 2016-11-16 ENCOUNTER — Ambulatory Visit: Payer: Self-pay

## 2016-11-16 ENCOUNTER — Encounter: Payer: Self-pay | Admitting: *Deleted

## 2016-11-16 ENCOUNTER — Other Ambulatory Visit: Payer: Self-pay | Admitting: *Deleted

## 2016-11-16 ENCOUNTER — Other Ambulatory Visit: Payer: Self-pay

## 2016-11-16 NOTE — Progress Notes (Signed)
Per staff message, pt was no show for PET scan and chemotherapy apt.  Pt has been contacted several times to reschedule missed chemotherapy apts.  This RN unable to reach pt via phone.  Per Dr. Irene Limbo, letter sent to patient to contact us regarding missed apts ASAP.  Pt informed in letter that if she no shows for any future apts without contacting us she will be dismissed from Dr. Grier Mitts practice.  Letter mailed 11/16/16

## 2016-11-23 ENCOUNTER — Encounter (HOSPITAL_COMMUNITY): Payer: Self-pay | Admitting: Emergency Medicine

## 2016-11-23 ENCOUNTER — Emergency Department (HOSPITAL_COMMUNITY): Payer: Medicaid Other

## 2016-11-23 ENCOUNTER — Other Ambulatory Visit: Payer: Self-pay

## 2016-11-23 ENCOUNTER — Ambulatory Visit: Payer: Self-pay | Admitting: Hematology

## 2016-11-23 ENCOUNTER — Inpatient Hospital Stay (HOSPITAL_COMMUNITY)
Admission: EM | Admit: 2016-11-23 | Discharge: 2016-11-27 | DRG: 166 | Discharge status: Against Medical Advice | Payer: Medicaid Other | Attending: Internal Medicine | Admitting: Internal Medicine

## 2016-11-23 DIAGNOSIS — C8190 Hodgkin lymphoma, unspecified, unspecified site: Secondary | ICD-10-CM | POA: Diagnosis present

## 2016-11-23 DIAGNOSIS — Z79899 Other long term (current) drug therapy: Secondary | ICD-10-CM

## 2016-11-23 DIAGNOSIS — C8128 Mixed cellularity classical Hodgkin lymphoma, lymph nodes of multiple sites: Secondary | ICD-10-CM

## 2016-11-23 DIAGNOSIS — Z87891 Personal history of nicotine dependence: Secondary | ICD-10-CM

## 2016-11-23 DIAGNOSIS — D638 Anemia in other chronic diseases classified elsewhere: Secondary | ICD-10-CM | POA: Diagnosis present

## 2016-11-23 DIAGNOSIS — J9601 Acute respiratory failure with hypoxia: Secondary | ICD-10-CM | POA: Diagnosis present

## 2016-11-23 DIAGNOSIS — D75839 Thrombocytosis, unspecified: Secondary | ICD-10-CM | POA: Diagnosis present

## 2016-11-23 DIAGNOSIS — Z9889 Other specified postprocedural states: Secondary | ICD-10-CM

## 2016-11-23 DIAGNOSIS — Z8701 Personal history of pneumonia (recurrent): Secondary | ICD-10-CM

## 2016-11-23 DIAGNOSIS — D473 Essential (hemorrhagic) thrombocythemia: Secondary | ICD-10-CM | POA: Diagnosis present

## 2016-11-23 DIAGNOSIS — R7989 Other specified abnormal findings of blood chemistry: Secondary | ICD-10-CM | POA: Diagnosis present

## 2016-11-23 DIAGNOSIS — R590 Localized enlarged lymph nodes: Secondary | ICD-10-CM | POA: Diagnosis present

## 2016-11-23 DIAGNOSIS — D649 Anemia, unspecified: Secondary | ICD-10-CM | POA: Diagnosis present

## 2016-11-23 DIAGNOSIS — J45909 Unspecified asthma, uncomplicated: Secondary | ICD-10-CM | POA: Diagnosis present

## 2016-11-23 DIAGNOSIS — Z7952 Long term (current) use of systemic steroids: Secondary | ICD-10-CM

## 2016-11-23 DIAGNOSIS — Z720 Tobacco use: Secondary | ICD-10-CM

## 2016-11-23 DIAGNOSIS — Z9119 Patient's noncompliance with other medical treatment and regimen: Secondary | ICD-10-CM

## 2016-11-23 DIAGNOSIS — E871 Hypo-osmolality and hyponatremia: Secondary | ICD-10-CM | POA: Diagnosis present

## 2016-11-23 DIAGNOSIS — I10 Essential (primary) hypertension: Secondary | ICD-10-CM | POA: Diagnosis present

## 2016-11-23 DIAGNOSIS — J189 Pneumonia, unspecified organism: Principal | ICD-10-CM | POA: Diagnosis present

## 2016-11-23 HISTORY — DX: Acute respiratory distress syndrome: J80

## 2016-11-23 LAB — I-STAT CG4 LACTIC ACID, ED: LACTIC ACID, VENOUS: 1.53 mmol/L (ref 0.5–1.9)

## 2016-11-23 LAB — CBC WITH DIFFERENTIAL/PLATELET
Basophils Absolute: 0 10*3/uL (ref 0.0–0.1)
Basophils Relative: 0 %
EOS ABS: 0.1 10*3/uL (ref 0.0–0.7)
Eosinophils Relative: 1 %
HCT: 35.9 % — ABNORMAL LOW (ref 36.0–46.0)
HEMOGLOBIN: 11.8 g/dL — AB (ref 12.0–15.0)
LYMPHS ABS: 0.4 10*3/uL — AB (ref 0.7–4.0)
LYMPHS PCT: 3 %
MCH: 30.5 pg (ref 26.0–34.0)
MCHC: 32.9 g/dL (ref 30.0–36.0)
MCV: 92.8 fL (ref 78.0–100.0)
MONOS PCT: 7 %
Monocytes Absolute: 0.8 10*3/uL (ref 0.1–1.0)
NEUTROS PCT: 89 %
Neutro Abs: 10.6 10*3/uL — ABNORMAL HIGH (ref 1.7–7.7)
Platelets: 608 10*3/uL — ABNORMAL HIGH (ref 150–400)
RBC: 3.87 MIL/uL (ref 3.87–5.11)
RDW: 15.1 % (ref 11.5–15.5)
WBC: 11.9 10*3/uL — ABNORMAL HIGH (ref 4.0–10.5)

## 2016-11-23 LAB — COMPREHENSIVE METABOLIC PANEL
ALBUMIN: 2.1 g/dL — AB (ref 3.5–5.0)
ALT: 9 U/L — ABNORMAL LOW (ref 14–54)
ANION GAP: 8 (ref 5–15)
AST: 19 U/L (ref 15–41)
Alkaline Phosphatase: 59 U/L (ref 38–126)
BILIRUBIN TOTAL: 0.6 mg/dL (ref 0.3–1.2)
BUN: <5 mg/dL — ABNORMAL LOW (ref 6–20)
CO2: 28 mmol/L (ref 22–32)
Calcium: 7.8 mg/dL — ABNORMAL LOW (ref 8.9–10.3)
Chloride: 96 mmol/L — ABNORMAL LOW (ref 101–111)
Creatinine, Ser: 0.6 mg/dL (ref 0.44–1.00)
GFR calc Af Amer: >60 mL/min (ref >60)
GLUCOSE: 115 mg/dL — AB (ref 65–99)
POTASSIUM: 3.8 mmol/L (ref 3.5–5.1)
Sodium: 132 mmol/L — ABNORMAL LOW (ref 135–145)
TOTAL PROTEIN: 5 g/dL — AB (ref 6.5–8.1)

## 2016-11-23 LAB — MRSA PCR SCREENING: MRSA BY PCR: NEGATIVE

## 2016-11-23 LAB — D-DIMER, QUANTITATIVE: D-Dimer, Quant: 1.75 ug/mL-FEU — ABNORMAL HIGH (ref 0.00–0.50)

## 2016-11-23 MED ORDER — SODIUM CHLORIDE 0.9 % IV SOLN: 250.0000 mL | INTRAVENOUS | Status: DC | PRN

## 2016-11-23 MED ORDER — HEPARIN SODIUM (PORCINE) 5000 UNIT/ML IJ SOLN
5000.0000 [IU] | Freq: Three times a day (TID) | INTRAMUSCULAR | Status: DC
  Filled 2016-11-23 (×3): qty 1

## 2016-11-23 MED ORDER — IPRATROPIUM-ALBUTEROL 0.5-2.5 (3) MG/3ML IN SOLN
3.0000 mL | Freq: Once | RESPIRATORY_TRACT | Status: AC
  Administered 2016-11-23: 3 mL via RESPIRATORY_TRACT
  Filled 2016-11-23: qty 3

## 2016-11-23 MED ORDER — IOPAMIDOL (ISOVUE-370) INJECTION 76%
100.0000 mL | Freq: Once | INTRAVENOUS | Status: AC | PRN
  Administered 2016-11-23: 100 mL via INTRAVENOUS

## 2016-11-23 MED ORDER — DEXTROSE 5 % IV SOLN
500.0000 mg | Freq: Once | INTRAVENOUS | Status: AC
  Administered 2016-11-23: 500 mg via INTRAVENOUS
  Filled 2016-11-23: qty 500

## 2016-11-23 MED ORDER — DIPHENHYDRAMINE HCL 25 MG PO CAPS
25.0000 mg | ORAL_CAPSULE | Freq: Four times a day (QID) | ORAL | Status: DC | PRN
  Administered 2016-11-23 – 2016-11-25 (×3): 25 mg via ORAL
  Filled 2016-11-23 (×2): qty 1

## 2016-11-23 MED ORDER — DEXTROSE 5 % IV SOLN
1.0000 g | Freq: Once | INTRAVENOUS | Status: AC
  Administered 2016-11-23: 1 g via INTRAVENOUS
  Filled 2016-11-23: qty 10

## 2016-11-23 MED ORDER — SODIUM CHLORIDE 0.9% FLUSH
3.0000 mL | Freq: Two times a day (BID) | INTRAVENOUS | Status: DC
  Administered 2016-11-24 – 2016-11-26 (×6): 3 mL via INTRAVENOUS

## 2016-11-23 MED ORDER — SODIUM CHLORIDE 0.9% FLUSH: 3.0000 mL | INTRAVENOUS | Status: DC | PRN

## 2016-11-23 MED ORDER — DEXTROSE 5 % IV SOLN
1.0000 g | INTRAVENOUS | Status: DC
  Administered 2016-11-24 – 2016-11-27 (×4): 1 g via INTRAVENOUS
  Filled 2016-11-23 (×5): qty 10

## 2016-11-23 MED ORDER — AZITHROMYCIN 500 MG IV SOLR
500.0000 mg | INTRAVENOUS | Status: DC
  Administered 2016-11-24 – 2016-11-26 (×3): 500 mg via INTRAVENOUS
  Filled 2016-11-23 (×5): qty 500

## 2016-11-23 MED ORDER — DIPHENHYDRAMINE HCL 25 MG PO CAPS
ORAL_CAPSULE | ORAL | Status: AC
  Administered 2016-11-23: 25 mg via ORAL
  Filled 2016-11-23: qty 1

## 2016-11-23 MED ORDER — SODIUM CHLORIDE 0.9 % IV BOLUS (SEPSIS)
1000.0000 mL | Freq: Once | INTRAVENOUS | Status: AC
  Administered 2016-11-23: 1000 mL via INTRAVENOUS

## 2016-11-23 MED ORDER — SODIUM CHLORIDE 0.9 % IJ SOLN
INTRAMUSCULAR | Status: AC
  Filled 2016-11-23: qty 50

## 2016-11-23 MED ORDER — IOPAMIDOL (ISOVUE-370) INJECTION 76%
INTRAVENOUS | Status: AC
  Filled 2016-11-23: qty 100

## 2016-11-23 NOTE — ED Triage Notes (Signed)
Patient c/o cough that is productive about with with green phlegm.  Patient is currently getting chemo and today is her next appt.

## 2016-11-23 NOTE — H&P (Signed)
History and Physical    Heidi [name] [id number] DOB: [birth year] DOA: 11/23/2016  PCP: Maren Reamer, MD  Patient coming from: Home Specialist: Dr. Julien Nordmann  Chief Complaint: sob, doe  HPI: Heidi [name] is a 30 y.o. female with medical history significant of Hodgkin's lymphoma with last chemotherapy approximately one month ago. Patient has not been able to go due to problems with transportation. Patient reports that within the last 2 or 3 days she has had increasing shortness of breath with sputum production. She denies any hemoptysis. Denies any palpitations. The problem was insidious in nature. And has gradually progressed and gotten worse. Nothing she is aware of makes it better ambulating makes her shortness of breath worse.  ED Course: Patient had CT angiogram of chest which reported no pulmonary embolism.  Review of Systems: As per HPI otherwise 10 point review of systems negative.    Past Medical History:  Diagnosis Date  . Asthma   . Hodgkin lymphoma ([place])   . Hypertension     Past Surgical History:  Procedure Laterality Date  . AXILLARY LYMPH NODE BIOPSY Right 03/19/2016   Procedure: AXILLARY LYMPH NODE BIOPSY;  Surgeon: Armandina Gemma, MD;  Location: [facility];  Service: General;  Laterality: Right;     reports that she quit smoking about 7 months ago. Her smoking use included Cigarettes. She has a 7.50 pack-year smoking history. She has never used smokeless tobacco. She reports that she drinks alcohol. Her drug history is not on file.  No Known Allergies   Family history: None reported when asked directly  Prior to Admission medications   Medication Sig Start Date End Date Taking? Authorizing Provider  dexamethasone (DECADRON) 4 MG tablet 2 tab (8mg ) twice a day for 3 days starting on day of chemotherapy 06/15/16   Brunetta Genera, MD  guaiFENesin (MUCINEX) 600 MG 12 hr tablet Take 1,200 mg by mouth 2 (two) times daily as needed for cough or to loosen  phlegm.    Historical Provider, MD  labetalol (NORMODYNE) 100 MG tablet TK 1 T PO  BID 03/29/16   Historical Provider, MD  levalbuterol Penne Lash HFA) 45 MCG/ACT inhaler Inhale 2 puffs into the lungs every 6 (six) hours as needed for wheezing. 03/23/16   Erick Colace, NP  lidocaine-prilocaine (EMLA) cream Apply 1 application topically as needed.    Historical Provider, MD  loratadine (CLARITIN) 10 MG tablet Take 1 tablet (10 mg total) by mouth daily. For up to 5 days following Neulasta injection. 06/15/16   Brunetta Genera, MD  metoprolol (LOPRESSOR) 50 MG tablet Take 1 tablet (50 mg total) by mouth 2 (two) times daily. 04/26/16   Verlee Monte, MD  ondansetron (ZOFRAN) 8 MG tablet Take 1 tablet (8 mg total) by mouth every 8 (eight) hours as needed for nausea (start on the 3rd day after chemotherapy). 05/03/16   Brunetta Genera, MD  prochlorperazine (COMPAZINE) 10 MG tablet Take 1 tablet (10 mg total) by mouth every 6 (six) hours as needed for nausea or vomiting. 05/03/16   Brunetta Genera, MD    Physical Exam: Vitals:   11/23/16 [id number] 11/23/16 0844 11/23/16 1126  BP: 138/92  118/67  Pulse: 119  106  Resp: 19  18  Temp: 99.1 F (37.3 C)    TempSrc: Oral    SpO2: (!) 89%  94%  Weight:  65.8 kg (145 lb)   Height:  5\' 7"  (1.702 m)    Constitutional: NAD, calm, comfortable  Vitals:   11/23/16 0843 11/23/16 0844 11/23/16 1126  BP: 138/92  118/67  Pulse: 119  106  Resp: 19  18  Temp: 99.1 F (37.3 C)    TempSrc: Oral    SpO2: (!) 89%  94%  Weight:  65.8 kg (145 lb)   Height:  5\' 7"  (1.702 m)    Eyes: PERRL, lids and conjunctivae normal ENMT: Mucous membranes are moist. Posterior pharynx clear of any exudate or lesions. Normal dentition.  Neck: normal, supple, no masses, no thyromegaly Respiratory: equal chest rise, speaking in full sentences with  in place, rhales with decreased breath sounds over rightlung Cardiovascular: Regular rate and rhythm, no murmurs / rubs / gallops. No  extremity edema. 2+ pedal pulses. No carotid bruits.  Abdomen: no tenderness, no masses palpated. No hepatosplenomegaly. Bowel sounds positive.  Musculoskeletal: no clubbing / cyanosis. No joint deformity upper and lower extremities. Good ROM, no contractures. Normal muscle tone.  Skin: no rashes, lesions, ulcers. No induration Neurologic: CN 3-12 grossly intact. Sensation intact,Strength 5/5 in all 4.  Psychiatric: Normal judgment and insight. Alert and oriented x 3. Normal mood.    Labs on Admission: I have personally reviewed following labs and imaging studies  CBC:  Recent Labs Lab 11/23/16 0914  WBC 11.9*  NEUTROABS 10.6*  HGB 11.8*  HCT 35.9*  MCV 92.8  PLT [id number]   Basic Metabolic Panel:  Recent Labs Lab 11/23/16 0914  NA 132*  K 3.8  CL 96*  CO2 28  GLUCOSE 115*  BUN <5*  CREATININE 0.60  CALCIUM 7.8*   GFR: Estimated Creatinine Clearance: 100 mL/min (by C-G formula based on SCr of 0.6 mg/dL). Liver Function Tests:  Recent Labs Lab 11/23/16 0914  AST 19  ALT 9*  ALKPHOS 59  BILITOT 0.6  PROT 5.0*  ALBUMIN 2.1*   No results for input(s): LIPASE, AMYLASE in the last 168 hours. No results for input(s): AMMONIA in the last 168 hours. Coagulation Profile: No results for input(s): INR, PROTIME in the last 168 hours. Cardiac Enzymes: No results for input(s): CKTOTAL, CKMB, CKMBINDEX, TROPONINI in the last 168 hours. BNP (last 3 results) No results for input(s): PROBNP in the last 8760 hours. HbA1C: No results for input(s): HGBA1C in the last 72 hours. CBG: No results for input(s): GLUCAP in the last 168 hours. Lipid Profile: No results for input(s): CHOL, HDL, LDLCALC, TRIG, CHOLHDL, LDLDIRECT in the last 72 hours. Thyroid Function Tests: No results for input(s): TSH, T4TOTAL, FREET4, T3FREE, THYROIDAB in the last 72 hours. Anemia Panel: No results for input(s): VITAMINB12, FOLATE, FERRITIN, TIBC, IRON, RETICCTPCT in the last 72 hours. Urine  analysis:    Component Value Date/Time   COLORURINE AMBER BIOCHEMICALS MAY BE AFFECTED BY COLOR (A) 08/01/2009 1305   APPEARANCEUR CLEAR 08/01/2009 1305   LABSPEC 1.029 08/01/2009 1305   PHURINE 6.5 08/01/2009 1305   GLUCOSEU NEGATIVE 08/01/2009 1305   HGBUR NEGATIVE 08/01/2009 1305   BILIRUBINUR SMALL (A) 08/01/2009 1305   KETONESUR 15 (A) 08/01/2009 1305   PROTEINUR NEGATIVE 08/01/2009 1305   UROBILINOGEN 1.0 08/01/2009 1305   NITRITE NEGATIVE 08/01/2009 1305   LEUKOCYTESUR SMALL (A) 08/01/2009 1305   Sepsis Labs: !!!!!!!!!!!!!!!!!!!!!!!!!!!!!!!!!!!!!!!!!!!! @LABRCNTIP (procalcitonin:4,lacticidven:4) )No results found for this or any previous visit (from the past 240 hour(s)).   Radiological Exams on Admission: Dg Chest 2 View  Result Date: 11/23/2016 CLINICAL DATA:  Worsening coughing congestion over the past week. History of Hodgkin's lymphoma. EXAM: CHEST  2 VIEW COMPARISON:  PET-CT 07/22/2016.  Chest radiographs 04/21/2016. FINDINGS: Right jugular Port-A-Cath terminates in the region of the bright brachiocephalic vein as on the prior PET-CT. The cardiac silhouette again appears mildly enlarged, though the right heart border is obscured. There is mild patchy opacity in the left mid lung which has greatly improved from the 03/2016 chest radiographs and is similar to the more recent PET-CT. There is extensive consolidation throughout much of the right lung which is similar to the prior radiographs but substantially worsened compared to the more recent PET-CT. An underlying right pleural effusion is not excluded. No pneumothorax. No acute osseous abnormality. IMPRESSION: Extensive right lung consolidation, increased from 06/2016. Electronically Signed   By: Logan Bores M.D.   On: 11/23/2016 09:36   Ct Angio Chest Pe W/cm &/or Wo Cm  Result Date: 11/23/2016 CLINICAL DATA:  Cough.  History of Hodgkin's lymphoma. EXAM: CT ANGIOGRAPHY CHEST WITH CONTRAST TECHNIQUE: Multidetector CT imaging  of the chest was performed using the standard protocol during bolus administration of intravenous contrast. Multiplanar CT image reconstructions and MIPs were obtained to evaluate the vascular anatomy. CONTRAST:  100 mL of Isovue 370 intravenously. PET scan of July 22, 2016. COMPARISON:  CT scan of April 10, 2016. FINDINGS: Cardiovascular: There is no evidence of thoracic aortic dissection or aneurysm. There is no definite evidence of pulmonary embolus. Mediastinum/Nodes: Mediastinal adenopathy is noted consistent with a history of lymphoma. Right internal jugular Port-A-Cath is noted which is looped within right internal jugular vein more superiorly, in distal tip is located in right brachiocephalic vein. Lungs/Pleura: Multifocal opacities are noted in the left lung which may represent pneumonia. There is nearly complete opacification of the right lung which is significantly worse compared to prior PET scan, most consistent with pneumonia. No pneumothorax is noted. Upper Abdomen: No definite abnormality seen in visualized portion of upper abdomen. Musculoskeletal: No significant osseous abnormality is noted. Review of the MIP images confirms the above findings. IMPRESSION: No definite evidence of pulmonary embolus. Stable mediastinal adenopathy consistent with history of lymphoma. Multiple small opacities are noted throughout the left lung concerning for pneumonia. There is nearly complete opacification of right lung most likely representing pneumonia, although infiltrative disease by lymphoma cannot be excluded. Electronically Signed   By: Marijo Conception, M.D.   On: 11/23/2016 11:40    Assessment/Plan Active Problems:   CAP (community acquired pneumonia) - Continue Azithromycin and Rocephin - sputum culture - urine legionella and strep antigen - HIV ab - If no improvement with antibiotic regimen would consider consulting oncology for further evaluation. Please refer to CT scan results  Acute  respiratory failure with hypoxia ([place]) - continue supplemental oxygen. Pt found to be hypoxic while in the ED which resolved with oxygen administration    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 ([place]) - Most likely contributing to decreased ability of patient to fight off infections. Pt will need f/u with Oncology after hospital discharge.   DVT prophylaxis: heparin Code Status: full Family Communication: None at bedside Disposition Plan: obs Consults called: none Admission status: med surg   Velvet Bathe MD [facility] Pager [phone]  If 7PM-7AM, please contact night-coverage [email or web address] Password [email or web address]  11/23/2016, 12:50 PM

## 2016-11-23 NOTE — ED Notes (Signed)
Pt. WAS PLACED ON 2 LITER OF O2.  Nurse aware.

## 2016-11-23 NOTE — ED Notes (Signed)
Pt refuses chest port cath access, sts it has to be "numbed" Prior to access. Request peripheral access.

## 2016-11-23 NOTE — ED Provider Notes (Signed)
[facility] DEPT Provider Note   CSN: [id number] Arrival date & time: 11/23/16  [id number]     History   Chief Complaint Chief Complaint  Patient presents with  . Cough    HPI Heidi [name] is a 30 y.o. female.  Patient is a 30 year old female with a history of Hodgkin's lymphoma. She is currently undergoing chemotherapy but has been noncompliant with her sessions. She's noticed several eczema sessions. She states her last chemotherapy was about 2 weeks ago. She comes in with a two-week history of worsening cough. She also has some associated shortness of breath. The cough is productive of yellow-green mucus. She denies any chest pain. No leg pain or swelling. She does have oxygen to use at home but typically doesn't use it at all. She's been using it over the last 2-3 days. She has a history of asthma but has been out of her inhaler for a long time. She denies any known fevers. No nausea or vomiting.      Past Medical History:  Diagnosis Date  . Asthma   . Hodgkin lymphoma ([place])   . Hypertension     Patient Active Problem List   Diagnosis Date Noted  . CAP (community acquired pneumonia) 11/23/2016  . Hyperpigmentation 06/04/2016  . Hypersensitivity reaction 05/04/2016  . Encounter for central line placement   . Hypokalemia 04/20/2016  . Volume overload 04/20/2016  . S/P bronchoscopy with biopsy   . Acute hypoxemic respiratory failure ([place])   . Abdominal distension   . 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 HCAP (healthcare-associated pneumonia) 04/10/2016  . Acute respiratory failure ([place]) 04/10/2016  . Hyponatremia 04/10/2016  . Thrombocytosis ([place]) 04/10/2016  . Endotracheal tube present   . ARDS (adult respiratory distress syndrome) ([place])   . Respiratory failure ([place]) 03/17/2016  . Pneumonitis 03/16/2016  . Postobstructive pneumonia 03/16/2016  . Acute respiratory failure with hypoxia ([place]) 03/16/2016  . Leukocytosis  03/16/2016  . Sepsis due to pneumonia ([place]) 03/16/2016  . Lung mass   . Lymphadenopathy     Past Surgical History:  Procedure Laterality Date  . AXILLARY LYMPH NODE BIOPSY Right 03/19/2016   Procedure: AXILLARY LYMPH NODE BIOPSY;  Surgeon: Armandina Gemma, MD;  Location: [facility] ORS;  Service: General;  Laterality: Right;    OB History    No data available       Home Medications    Prior to Admission medications   Medication Sig Start Date End Date Taking? Authorizing Provider  dexamethasone (DECADRON) 4 MG tablet 2 tab (8mg ) twice a day for 3 days starting on day of chemotherapy 06/15/16   Brunetta Genera, MD  guaiFENesin (MUCINEX) 600 MG 12 hr tablet Take 1,200 mg by mouth 2 (two) times daily as needed for cough or to loosen phlegm.    Historical Provider, MD  labetalol (NORMODYNE) 100 MG tablet TK 1 T PO  BID 03/29/16   Historical Provider, MD  levalbuterol Penne Lash HFA) 45 MCG/ACT inhaler Inhale 2 puffs into the lungs every 6 (six) hours as needed for wheezing. 03/23/16   Erick Colace, NP  lidocaine-prilocaine (EMLA) cream Apply 1 application topically as needed.    Historical Provider, MD  loratadine (CLARITIN) 10 MG tablet Take 1 tablet (10 mg total) by mouth daily. For up to 5 days following Neulasta injection. 06/15/16   Brunetta Genera, MD  metoprolol (LOPRESSOR) 50 MG tablet Take 1 tablet (50 mg total) by mouth 2 (two) times  daily. 04/26/16   Verlee Monte, MD  ondansetron (ZOFRAN) 8 MG tablet Take 1 tablet (8 mg total) by mouth every 8 (eight) hours as needed for nausea (start on the 3rd day after chemotherapy). 05/03/16   Brunetta Genera, MD  prochlorperazine (COMPAZINE) 10 MG tablet Take 1 tablet (10 mg total) by mouth every 6 (six) hours as needed for nausea or vomiting. 05/03/16   Brunetta Genera, MD    Family History No family history on file.  Social History Social History  Substance Use Topics  . Smoking status: Former Smoker    Packs/day: 0.50    Years: 15.00      Types: Cigarettes    Quit date: 03/27/2016  . Smokeless tobacco: Never Used  . Alcohol use Yes     Comment: occasional now     Allergies   Patient has no known allergies.   Review of Systems Review of Systems  Constitutional: Positive for fatigue. Negative for chills, diaphoresis and fever.  HENT: Negative for congestion, rhinorrhea and sneezing.   Eyes: Negative.   Respiratory: Positive for cough, shortness of breath and wheezing. Negative for chest tightness.   Cardiovascular: Negative for chest pain and leg swelling.  Gastrointestinal: Negative for abdominal pain, blood in stool, diarrhea, nausea and vomiting.  Genitourinary: Negative for difficulty urinating, flank pain, frequency and hematuria.  Musculoskeletal: Negative for arthralgias and back pain.  Skin: Negative for rash.  Neurological: Negative for dizziness, speech difficulty, weakness, numbness and headaches.     Physical Exam Updated Vital Signs BP 118/67   Pulse 106   Temp 99.1 F (37.3 C) (Oral)   Resp 18   Ht 5\' 7"  (1.702 m)   Wt 145 lb (65.8 kg)   LMP 10/13/2016 (Approximate)   SpO2 94%   BMI 22.71 kg/m   Physical Exam  Constitutional: She is oriented to person, place, and time. She appears well-developed and well-nourished.  HENT:  Head: Normocephalic and atraumatic.  Eyes: Pupils are equal, round, and reactive to light.  Neck: Normal range of motion. Neck supple.  Cardiovascular: Normal rate, regular rhythm and normal heart sounds.   Pulmonary/Chest: Effort normal. No respiratory distress. She has wheezes. She has rales. She exhibits no tenderness.  Patient has oxygen saturation between 86 and 98% on room air. She has some mild expiratory wheezing with some rails to the left lung field  Abdominal: Soft. Bowel sounds are normal. There is no tenderness. There is no rebound and no guarding.  Musculoskeletal: Normal range of motion. She exhibits no edema.  No edema or calf tenderness   Lymphadenopathy:    She has no cervical adenopathy.  Neurological: She is alert and oriented to person, place, and time.  Skin: Skin is warm and dry. No rash noted.  Psychiatric: She has a normal mood and affect.     ED Treatments / Results  Labs (all labs ordered are listed, but only abnormal results are displayed) Labs Reviewed  COMPREHENSIVE METABOLIC PANEL - Abnormal; Notable for the following:       Result Value   Sodium 132 (*)    Chloride 96 (*)    Glucose, Bld 115 (*)    BUN <5 (*)    Calcium 7.8 (*)    Total Protein 5.0 (*)    Albumin 2.1 (*)    ALT 9 (*)    All other components within normal limits  CBC WITH DIFFERENTIAL/PLATELET - Abnormal; Notable for the following:    WBC 11.9 (*)  Hemoglobin 11.8 (*)    HCT 35.9 (*)    Platelets 608 (*)    Neutro Abs 10.6 (*)    Lymphs Abs 0.4 (*)    All other components within normal limits  D-DIMER, QUANTITATIVE (NOT AT [facility]) - Abnormal; Notable for the following:    D-Dimer, Quant 1.75 (*)    All other components within normal limits  I-STAT CG4 LACTIC ACID, ED    EKG  EKG Interpretation None       Radiology Dg Chest 2 View  Result Date: 11/23/2016 CLINICAL DATA:  Worsening coughing congestion over the past week. History of Hodgkin's lymphoma. EXAM: CHEST  2 VIEW COMPARISON:  PET-CT 07/22/2016.  Chest radiographs 04/21/2016. FINDINGS: Right jugular Port-A-Cath terminates in the region of the bright brachiocephalic vein as on the prior PET-CT. The cardiac silhouette again appears mildly enlarged, though the right heart border is obscured. There is mild patchy opacity in the left mid lung which has greatly improved from the 03/2016 chest radiographs and is similar to the more recent PET-CT. There is extensive consolidation throughout much of the right lung which is similar to the prior radiographs but substantially worsened compared to the more recent PET-CT. An underlying right pleural effusion is not excluded. No  pneumothorax. No acute osseous abnormality. IMPRESSION: Extensive right lung consolidation, increased from 06/2016. Electronically Signed   By: Logan Bores M.D.   On: 11/23/2016 09:36   Ct Angio Chest Pe W/cm &/or Wo Cm  Result Date: 11/23/2016 CLINICAL DATA:  Cough.  History of Hodgkin's lymphoma. EXAM: CT ANGIOGRAPHY CHEST WITH CONTRAST TECHNIQUE: Multidetector CT imaging of the chest was performed using the standard protocol during bolus administration of intravenous contrast. Multiplanar CT image reconstructions and MIPs were obtained to evaluate the vascular anatomy. CONTRAST:  100 mL of Isovue 370 intravenously. PET scan of July 22, 2016. COMPARISON:  CT scan of April 10, 2016. FINDINGS: Cardiovascular: There is no evidence of thoracic aortic dissection or aneurysm. There is no definite evidence of pulmonary embolus. Mediastinum/Nodes: Mediastinal adenopathy is noted consistent with a history of lymphoma. Right internal jugular Port-A-Cath is noted which is looped within right internal jugular vein more superiorly, in distal tip is located in right brachiocephalic vein. Lungs/Pleura: Multifocal opacities are noted in the left lung which may represent pneumonia. There is nearly complete opacification of the right lung which is significantly worse compared to prior PET scan, most consistent with pneumonia. No pneumothorax is noted. Upper Abdomen: No definite abnormality seen in visualized portion of upper abdomen. Musculoskeletal: No significant osseous abnormality is noted. Review of the MIP images confirms the above findings. IMPRESSION: No definite evidence of pulmonary embolus. Stable mediastinal adenopathy consistent with history of lymphoma. Multiple small opacities are noted throughout the left lung concerning for pneumonia. There is nearly complete opacification of right lung most likely representing pneumonia, although infiltrative disease by lymphoma cannot be excluded. Electronically Signed   By:  Marijo Conception, M.D.   On: 11/23/2016 11:40    Procedures Procedures (including critical care time)  Medications Ordered in ED Medications  iopamidol (ISOVUE-370) 76 % injection (not administered)  sodium chloride 0.9 % injection (not administered)  cefTRIAXone (ROCEPHIN) 1 g in dextrose 5 % 50 mL IVPB (not administered)  azithromycin (ZITHROMAX) 500 mg in dextrose 5 % 250 mL IVPB (not administered)  sodium chloride 0.9 % bolus 1,000 mL (not administered)  ipratropium-albuterol (DUONEB) 0.5-2.5 (3) MG/3ML nebulizer solution 3 mL (3 mLs Nebulization Given 11/23/16 0907)  iopamidol (  ISOVUE-370) 76 % injection 100 mL (100 mLs Intravenous Contrast Given 11/23/16 1110)     Initial Impression / Assessment and Plan / ED Course  I have reviewed the triage vital signs and the nursing notes.  Pertinent labs & imaging results that were available during my care of the patient were reviewed by me and considered in my medical decision making (see chart for details).  Clinical Course     Patient has no evidence of pulmonary embolus. She has a large right-sided pneumonia. She is oxygen requiring on a nasal cannula 2 L. She has no suggestions of sepsis. She was given antibiotics for community-acquired pneumonia. I spoke with Dr. Wendee Beavers who will admit the patient to the hospitalist service.  Final Clinical Impressions(s) / ED Diagnoses   Final diagnoses:  Community acquired pneumonia, unspecified laterality    New Prescriptions New Prescriptions   No medications on file     Malvin Johns, MD 11/23/16 1206

## 2016-11-24 ENCOUNTER — Ambulatory Visit: Admit: 2016-11-24 | Payer: MEDICAID | Admitting: Radiation Oncology

## 2016-11-24 DIAGNOSIS — D638 Anemia in other chronic diseases classified elsewhere: Secondary | ICD-10-CM | POA: Diagnosis present

## 2016-11-24 DIAGNOSIS — J45909 Unspecified asthma, uncomplicated: Secondary | ICD-10-CM | POA: Diagnosis present

## 2016-11-24 DIAGNOSIS — J189 Pneumonia, unspecified organism: Secondary | ICD-10-CM | POA: Diagnosis present

## 2016-11-24 DIAGNOSIS — R7989 Other specified abnormal findings of blood chemistry: Secondary | ICD-10-CM | POA: Diagnosis present

## 2016-11-24 DIAGNOSIS — Z8701 Personal history of pneumonia (recurrent): Secondary | ICD-10-CM | POA: Diagnosis not present

## 2016-11-24 DIAGNOSIS — I10 Essential (primary) hypertension: Secondary | ICD-10-CM | POA: Diagnosis present

## 2016-11-24 DIAGNOSIS — J9601 Acute respiratory failure with hypoxia: Secondary | ICD-10-CM | POA: Diagnosis present

## 2016-11-24 DIAGNOSIS — Z9119 Patient's noncompliance with other medical treatment and regimen: Secondary | ICD-10-CM | POA: Diagnosis not present

## 2016-11-24 DIAGNOSIS — E871 Hypo-osmolality and hyponatremia: Secondary | ICD-10-CM | POA: Diagnosis present

## 2016-11-24 DIAGNOSIS — R590 Localized enlarged lymph nodes: Secondary | ICD-10-CM | POA: Diagnosis present

## 2016-11-24 DIAGNOSIS — Z79899 Other long term (current) drug therapy: Secondary | ICD-10-CM | POA: Diagnosis not present

## 2016-11-24 DIAGNOSIS — R05 Cough: Secondary | ICD-10-CM | POA: Diagnosis present

## 2016-11-24 DIAGNOSIS — Z7952 Long term (current) use of systemic steroids: Secondary | ICD-10-CM | POA: Diagnosis not present

## 2016-11-24 DIAGNOSIS — C8190 Hodgkin lymphoma, unspecified, unspecified site: Secondary | ICD-10-CM | POA: Diagnosis present

## 2016-11-24 DIAGNOSIS — Z87891 Personal history of nicotine dependence: Secondary | ICD-10-CM | POA: Diagnosis not present

## 2016-11-24 LAB — BLOOD CULTURE ID PANEL (REFLEXED)
Acinetobacter baumannii: NOT DETECTED
CANDIDA GLABRATA: NOT DETECTED
CANDIDA KRUSEI: NOT DETECTED
CANDIDA PARAPSILOSIS: NOT DETECTED
Candida albicans: NOT DETECTED
Candida tropicalis: NOT DETECTED
ENTEROBACTER CLOACAE COMPLEX: NOT DETECTED
ESCHERICHIA COLI: NOT DETECTED
Enterobacteriaceae species: NOT DETECTED
Enterococcus species: NOT DETECTED
Haemophilus influenzae: NOT DETECTED
KLEBSIELLA OXYTOCA: NOT DETECTED
KLEBSIELLA PNEUMONIAE: NOT DETECTED
Listeria monocytogenes: NOT DETECTED
Neisseria meningitidis: NOT DETECTED
PROTEUS SPECIES: NOT DETECTED
PSEUDOMONAS AERUGINOSA: NOT DETECTED
STAPHYLOCOCCUS SPECIES: NOT DETECTED
STREPTOCOCCUS PNEUMONIAE: NOT DETECTED
STREPTOCOCCUS PYOGENES: NOT DETECTED
Serratia marcescens: NOT DETECTED
Staphylococcus aureus (BCID): NOT DETECTED
Streptococcus agalactiae: NOT DETECTED
Streptococcus species: NOT DETECTED

## 2016-11-24 LAB — STREP PNEUMONIAE URINARY ANTIGEN: Strep Pneumo Urinary Antigen: NEGATIVE

## 2016-11-24 LAB — HIV ANTIBODY (ROUTINE TESTING W REFLEX): HIV Screen 4th Generation wRfx: NONREACTIVE

## 2016-11-24 LAB — PROCALCITONIN: Procalcitonin: 0.18 ng/mL

## 2016-11-24 LAB — SEDIMENTATION RATE: SED RATE: 12 mm/h (ref 0–22)

## 2016-11-24 MED ORDER — IPRATROPIUM-ALBUTEROL 0.5-2.5 (3) MG/3ML IN SOLN
3.0000 mL | RESPIRATORY_TRACT | Status: DC | PRN
  Administered 2016-11-25 – 2016-11-27 (×3): 3 mL via RESPIRATORY_TRACT
  Filled 2016-11-24 (×3): qty 3

## 2016-11-24 MED ORDER — GUAIFENESIN ER 600 MG PO TB12
600.0000 mg | ORAL_TABLET | Freq: Two times a day (BID) | ORAL | Status: DC
  Administered 2016-11-24 – 2016-11-27 (×7): 600 mg via ORAL
  Filled 2016-11-24 (×7): qty 1

## 2016-11-24 MED ORDER — GUAIFENESIN-DM 100-10 MG/5ML PO SYRP
5.0000 mL | ORAL_SOLUTION | ORAL | Status: DC | PRN
  Administered 2016-11-24 – 2016-11-26 (×3): 5 mL via ORAL
  Filled 2016-11-24 (×3): qty 10

## 2016-11-24 NOTE — Consult Note (Signed)
This patient's records were reviewed.It appears that she has significant lung consolidation likely due to community aquired pneumonia. There does not appear to be distinct tumor, however the plan is for pulmonary to evaluate this patient for bronchoscopy. We would be happy to participate in this patient's care if her pulmonary findings are concerning for progressive lymphoma, however will defer the timing and need for our input to Dr. Irene Limbo.     [name], PAC

## 2016-11-24 NOTE — Progress Notes (Signed)
Heidi [name]   HEMATOLOGY/ONCOLOGY INPATIENT PROGRESS NOTE  Date of Service: 11/24/2016  Inpatient Attending: .Theodis Blaze, MD   SUBJECTIVE  Patient was seen late last evening. She was admitted with 3-4 days of increasing dyspnea on exertion and a new productive cough with greenish phlegm. In the emergency room she had a chest x-ray showed extensive right lung consolidation. She subsequently had a CTA of the chest which showed no PE but extensive near complete opacification of the right lung most likely representing pneumonia and some small patchy opacities in her left lung. She was admitted to the hospitalist service and started on antibiotics for community acquired pneumonia. Patient reports no fevers no chills no night sweats. Reports that her shortness of breath has been fairly new in the last 3-4 days with significant nasty greenish yellowish phlegm. Denies any sick contacts. Continues to smoke cigarettes.   OBJECTIVE:  NAD on oxygen at rest  PHYSICAL EXAMINATION: . Vitals:   11/23/16 1347 11/23/16 1631 11/23/16 2136 11/24/16 0540  BP: 124/79 112/69 124/67 132/70  Pulse: 106 (!) 111 100 (!) 106  Resp: 16 18 18 16   Temp:  98.4 F (36.9 C) 97.8 F (36.6 C) 100.2 F (37.9 C)  TempSrc:  Oral Oral Oral  SpO2: 93% 99% 96% 100%  Weight:      Height:       Filed Weights   11/23/16 0844  Weight: 145 lb (65.8 kg)   .Body mass index is 22.71 kg/m.  GENERAL:alert, in no acute distress and comfortable SKIN: No acute rashes EYES: normal, conjunctiva are pink and non-injected, sclera clear OROPHARYNX: Moist mucous membranes NECK: supple, no JVD, thyroid normal size, non-tender, without nodularity LYMPH:  no palpable lymphadenopathy in the cervical, axillary or inguinal LUNGS: Decreased air entry right base with scattered rales on the right side, good air entry on the left HEART: regular rate & rhythm,  no murmurs and no lower extremity edema ABDOMEN: abdomen soft, non-tender,  normoactive bowel sounds  Musculoskeletal: no cyanosis of digits and no clubbing  PSYCH: alert & oriented x 3 with fluent speech NEURO: no focal motor/sensory deficits  MEDICAL HISTORY:  Past Medical History:  Diagnosis Date  . Asthma   . Hodgkin lymphoma ([place])   . Hypertension     SURGICAL HISTORY: Past Surgical History:  Procedure Laterality Date  . AXILLARY LYMPH NODE BIOPSY Right 03/19/2016   Procedure: AXILLARY LYMPH NODE BIOPSY;  Surgeon: Armandina Gemma, MD;  Location: [facility] OR[facility];  Service: General;  Laterality: Right;    SOCIAL HISTORY: Social History   Social History  . Marital status: Single    Spouse name: N/A  . Number of children: N/A  . Years of education: N/A   Occupational History  . Not on file.   Social History Main Topics  . Smoking status: Former Smoker    Packs/day: 0.50    Years: 15.00    Types: Cigarettes    Quit date: 03/27/2016  . Smokeless tobacco: Never Used  . Alcohol use Yes     Comment: occasional now  . Drug use: Unknown  . Sexual activity: Not on file   Other Topics Concern  . Not on file   Social History Narrative  . No narrative on file    FAMILY HISTORY: No family history on file.  ALLERGIES:  has No Known Allergies.  MEDICATIONS:  Scheduled Meds: . azithromycin  500 mg Intravenous Q24H  . cefTRIAXone (ROCEPHIN)  IV  1 g Intravenous Q24H  . guaiFENesin  600 mg Oral BID  . heparin  5,000 Units Subcutaneous Q8H  . sodium chloride flush  3 mL Intravenous Q12H   Continuous Infusions: PRN Meds:.sodium chloride, diphenhydrAMINE, guaiFENesin-dextromethorphan, ipratropium-albuterol, sodium chloride flush  REVIEW OF SYSTEMS:    10 Point review of Systems was done is negative except as noted above.   LABORATORY DATA:  I have reviewed the data as listed  . CBC Latest Ref Rng & Units 11/23/2016 10/19/2016 09/29/2016  WBC 4.0 - 10.5 K/uL 11.9(H) 11.7(H) 5.6  Hemoglobin 12.0 - 15.0 g/dL 11.8(L) 12.2 11.8  Hematocrit 36.0 - 46.0 %  35.9(L) 36.8 35.5  Platelets 150 - 400 K/uL 608(H) 609(H) 308    . CMP Latest Ref Rng & Units 11/23/2016 10/19/2016 09/29/2016  Glucose 65 - 99 mg/dL 115(H) 108 141(H)  BUN 6 - 20 mg/dL <5(L) 5.2(L) 4.3(L)  Creatinine 0.44 - 1.00 mg/dL 0.60 0.7 0.7  Sodium 135 - 145 mmol/L 132(L) 135(L) 135(L)  Potassium 3.5 - 5.1 mmol/L 3.8 4.0 3.7  Chloride 101 - 111 mmol/L 96(L) - -  CO2 22 - 32 mmol/L 28 24 24   Calcium 8.9 - 10.3 mg/dL 7.8(L) 9.2 8.9  Total Protein 6.5 - 8.1 g/dL 5.0(L) 6.6 6.3(L)  Total Bilirubin 0.3 - 1.2 mg/dL 0.6 0.58 0.69  Alkaline Phos 38 - 126 U/L 59 98 111  AST 15 - 41 U/L 19 14 15   ALT 14 - 54 U/L 9(L) 10 13     RADIOGRAPHIC STUDIES: I have personally reviewed the radiological images as listed and agreed with the findings in the report. Dg Chest 2 View  Result Date: 11/23/2016 CLINICAL DATA:  Worsening coughing congestion over the past week. History of Hodgkin's lymphoma. EXAM: CHEST  2 VIEW COMPARISON:  PET-CT 07/22/2016.  Chest radiographs 04/21/2016. FINDINGS: Right jugular Port-A-Cath terminates in the region of the bright brachiocephalic vein as on the prior PET-CT. The cardiac silhouette again appears mildly enlarged, though the right heart border is obscured. There is mild patchy opacity in the left mid lung which has greatly improved from the 03/2016 chest radiographs and is similar to the more recent PET-CT. There is extensive consolidation throughout much of the right lung which is similar to the prior radiographs but substantially worsened compared to the more recent PET-CT. An underlying right pleural effusion is not excluded. No pneumothorax. No acute osseous abnormality. IMPRESSION: Extensive right lung consolidation, increased from 06/2016. Electronically Signed   By: Logan Bores M.D.   On: 11/23/2016 09:36   Ct Angio Chest Pe W/cm &/or Wo Cm  Result Date: 11/23/2016 CLINICAL DATA:  Cough.  History of Hodgkin's lymphoma. EXAM: CT ANGIOGRAPHY CHEST WITH  CONTRAST TECHNIQUE: Multidetector CT imaging of the chest was performed using the standard protocol during bolus administration of intravenous contrast. Multiplanar CT image reconstructions and MIPs were obtained to evaluate the vascular anatomy. CONTRAST:  100 mL of Isovue 370 intravenously. PET scan of July 22, 2016. COMPARISON:  CT scan of April 10, 2016. FINDINGS: Cardiovascular: There is no evidence of thoracic aortic dissection or aneurysm. There is no definite evidence of pulmonary embolus. Mediastinum/Nodes: Mediastinal adenopathy is noted consistent with a history of lymphoma. Right internal jugular Port-A-Cath is noted which is looped within right internal jugular vein more superiorly, in distal tip is located in right brachiocephalic vein. Lungs/Pleura: Multifocal opacities are noted in the left lung which may represent pneumonia. There is nearly complete opacification of the right lung which is significantly worse compared to prior PET scan, most consistent  with pneumonia. No pneumothorax is noted. Upper Abdomen: No definite abnormality seen in visualized portion of upper abdomen. Musculoskeletal: No significant osseous abnormality is noted. Review of the MIP images confirms the above findings. IMPRESSION: No definite evidence of pulmonary embolus. Stable mediastinal adenopathy consistent with history of lymphoma. Multiple small opacities are noted throughout the left lung concerning for pneumonia. There is nearly complete opacification of right lung most likely representing pneumonia, although infiltrative disease by lymphoma cannot be excluded. Electronically Signed   By: Marijo Conception, M.D.   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 She was noted to have  significant constitutional symptoms including significant weight loss, fevers chills and some night sweats. HIV negative Hepatitis C and hepatitis B serologies negative. Echo with normal ejection fraction. Patient has completed cycle 6 day 1 AVD (bleomycin on hold due to lung compromise) DLCO on her pulmonary function tests are 37 percent suggesting severe reduction. And was still actively smoking. Patient has had multiple treatment delays especially in her fifth cycle due to avoidable reasons including transportation issues. Thus far had not had any complications. Her last chemotherapy was on 10/19/2016 and she missed her cycle 6 day 15 on 11/16/2016. Currently not neutropenic #2 active smoker   #3 currently admitted to the hospital with distant exertion shortness of breath and new productive cough. Dense extensive right lung consolidation and a few left lung infiltrates. Her acute presentation and normal sedimentation rate suggest against this being only CHL. She had nearly completed her treatment for [facility] though had multiple treatment delays due to her compliance issues. Though not neutropenic she is immunocompromised due to her chemotherapy and would need to consider a broader possibility of infectious issues. Plan -Would recommend consulting pulmonary critical care service for possible bronchoscopy to evaluate the right lung for obstructive lesions as well as to get definitive cultures given drug differential diagnosis in the setting of immunosuppression from chemotherapy. -The bronchoscopy with biopsy will also help rule out any progression of her Hodgkin's lymphoma. -Would also consider involving infectious disease. Would broaden her antibiotic coverage (Zosyn/Vancomycin) to cover Pseudomonas in the setting of structural lung disease immunosuppression and active smoking. -Blood cultures pending. -If there is evidence of persistent Hodgkin's lymphoma with obstructive lung finding would  need to consider radiation oncology input and alternative systemic therapy options. -We will continue to follow along. -Appreciate excellent care of her hospital medicine team.   I spent 40 minutes counseling the patient face to face. The total time spent in the appointment was 40 minutes and more than 50% was on counseling and direct patient cares.    Sullivan Lone MD [place] AAHIVMS [facility] Hematology/Oncology Physician [facility]  (Office):       [phone] (Work cell):  [phone] (Fax):           [phone]  11/24/2016 2:03 PM

## 2016-11-24 NOTE — Progress Notes (Signed)
Radiation Oncology was consulted on this patient.  In looking through the record, I agree with Dr. Grier Mitts recommendation:   " . [name] Kitchen Would recommend consulting pulmonary critical care service for possible bronchoscopy to evaluate the right lung for obstructive lesions as well as to get definitive cultures given drug differential diagnosis in the setting of immunosuppression from chemotherapy. -The bronchoscopy with biopsy will also help rule out any progression of her Hodgkin's lymphoma. -Would also consider involving infectious disease. Would broaden her antibiotic coverage (Zosyn/Vancomycin) to cover Pseudomonas in the setting of structural lung disease immunosuppression and active smoking. -Blood cultures pending. -If there is evidence of persistent Hodgkin's lymphoma with obstructive lung finding would need to consider radiation oncology input and alternative systemic therapy options.    Please consult pulmonary medicine for bronchoscopy and I will involve radiation oncology in the event they find an obstructing lesion.  Thank You.   MM  ------------------------------------------------   Tyler Pita, MD [place] Director and Director of Stereotactic Radiosurgery Direct Dial: [phone]  Fax: [phone] [email or web address]  Skype  LinkedIn

## 2016-11-24 NOTE — Progress Notes (Signed)
Patient ID: Heidi [name], female   DOB: December 24, 1986, 30 y.o.   MRN: [id number]    PROGRESS NOTE    Heidi [name]  [id number] DOB: [birth year] DOA: 11/23/2016  PCP: Maren Reamer, MD   Brief Narrative:  30 y.o. female with Hodgkin's lymphoma with last chemotherapy approximately one month ago. Patient has not been able to go due to problems with transportation. Patient reports that within the last 2 or 3 days she has had increasing shortness of breath with sputum production. She denies any hemoptysis. Denies any palpitations.   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 Irene Limbo, I have notified him of patient's admission    Thrombocytosis - Likely reactive, CBC in the morning    Hyponatremia - Appears to be prerenal in etiology - Encouraged oral intake, repeat BMP in the morning   DVT prophylaxis: Heparin SQ Code Status: Full Family Communication: Patient at bedside  Disposition Plan: Home in 1-2 days, when off of IV antibiotics  Consultants:   None  Procedures:   None  Antimicrobials:   Zithromax 11/23/2016 -->  Rocephin 11/23/2016 -->  Subjective: No events overnight. Patient still with significant cough and exertional dyspnea.  Objective: Vitals:   11/23/16 1347 11/23/16 1631 11/23/16 2136 11/24/16 0540  BP: 124/79 112/69 124/67 132/70  Pulse: 106 (!) 111  100 (!) 106  Resp: 16 18 18 16   Temp:  98.4 F (36.9 C) 97.8 F (36.6 C) 100.2 F (37.9 C)  TempSrc:  Oral Oral Oral  SpO2: 93% 99% 96% 100%  Weight:      Height:        Intake/Output Summary (Last 24 hours) at 11/24/16 1031 Last data filed at 11/24/16 [id number]  Gross per 24 hour  Intake             1130 ml  Output              800 ml  Net              330 ml   Filed Weights   11/23/16 0844  Weight: 65.8 kg (145 lb)    Examination:  General exam: Appears calm and comfortable  Respiratory system: Scattered rhonchi bilaterally, worse on the right side, no wheezing Cardiovascular system: S1 & S2 heard, RRR. No JVD, murmurs, rubs, gallops or clicks. No pedal edema. Gastrointestinal system: Abdomen is nondistended, soft and nontender. No organomegaly or masses felt. Normal bowel sounds heard. Central nervous system: Alert and oriented. No focal neurological deficits. Extremities: Symmetric 5 x 5 power.  Data Reviewed: I have personally reviewed following labs and imaging studies  CBC:  Recent Labs Lab 11/23/16 0914  WBC 11.9*  NEUTROABS 10.6*  HGB 11.8*  HCT 35.9*  MCV 92.8  PLT [id number]*   Basic Metabolic Panel:  Recent Labs Lab 11/23/16 0914  NA 132*  K 3.8  CL 96*  CO2 28  GLUCOSE 115*  BUN <5*  CREATININE 0.60  CALCIUM 7.8*   Liver Function Tests:  Recent Labs Lab 11/23/16 0914  AST 19  ALT 9*  ALKPHOS 59  BILITOT 0.6  PROT 5.0*  ALBUMIN 2.1*   Urine analysis:    Component Value Date/Time   COLORURINE AMBER BIOCHEMICALS MAY BE AFFECTED BY COLOR (A) 08/01/2009 1305   APPEARANCEUR CLEAR 08/01/2009 1305   LABSPEC 1.029 08/01/2009 1305   PHURINE 6.5 08/01/2009 1305   GLUCOSEU NEGATIVE 08/01/2009 1305   HGBUR NEGATIVE 08/01/2009 1305   BILIRUBINUR SMALL (A) 08/01/2009 1305   KETONESUR 15 (A) 08/01/2009 1305   PROTEINUR NEGATIVE 08/01/2009 1305   UROBILINOGEN 1.0 08/01/2009 1305   NITRITE NEGATIVE 08/01/2009 1305   LEUKOCYTESUR SMALL (A)  08/01/2009 1305   Recent Results (from the past 240 hour(s))  MRSA PCR Screening     Status: None   Collection Time: 11/23/16  5:36 PM  Result Value Ref Range Status   MRSA by PCR NEGATIVE NEGATIVE Final   Radiology Studies: Dg Chest 2 View Result Date: 11/23/2016 Extensive right lung consolidation, increased from 06/2016.   Ct Angio Chest Pe W/cm &/or Wo Cm Result Date: 11/23/2016 No definite evidence of pulmonary embolus. Stable mediastinal adenopathy consistent with history of lymphoma. Multiple small opacities are noted throughout the left lung concerning for pneumonia. There is nearly complete opacification of right lung most likely representing pneumonia, although infiltrative disease by lymphoma cannot be excluded.   Scheduled Meds: . azithromycin  500 mg Intravenous Q24H  . cefTRIAXone (ROCEPHIN)  IV  1 g Intravenous Q24H  . heparin  5,000 Units Subcutaneous Q8H  . sodium chloride flush  3 mL Intravenous Q12H   Continuous Infusions:   LOS: 1 day   Time spent: 20 minutes   Faye Ramsay, MD [facility] Pager [phone]  If 7PM-7AM, please contact night-coverage [email or web address] Password [email or web address] 11/24/2016, 10:31 AM

## 2016-11-24 NOTE — Progress Notes (Signed)
Nutrition Brief Note  Patient identified on the Malnutrition Screening Tool (MST) Report  Wt Readings from Last 15 Encounters:  11/23/16 145 lb (65.8 kg)  10/19/16 144 lb (65.3 kg)  09/03/16 155 lb 1.6 oz (70.4 kg)  08/02/16 152 lb 11.2 oz (69.3 kg)  06/15/16 149 lb 9.6 oz (67.9 kg)  05/31/16 141 lb 14.4 oz (64.4 kg)  05/17/16 130 lb (59 kg)  05/14/16 131 lb 11.2 oz (59.7 kg)  05/03/16 125 lb 4.8 oz (56.8 kg)  04/26/16 142 lb 6.7 oz (64.6 kg)  03/23/16 128 lb 8 oz (58.3 kg)  05/15/14 153 lb 11.2 oz (69.7 kg)  03/11/14 158 lb (71.7 kg)  02/22/14 156 lb 7 oz (71 kg)   MST indicated patient had poor appetite and had lost 7-8 lbs over 3 weeks. However, upon talking to patient at bedside she reports her appetite and intake are good. She reports she is note sure of her UBW and that her weight fluctuates. Denies N/V, abdominal pain, or constipation/diarrhea. Refused NFPE. Per RN patient is eating well.  Body mass index is 22.71 kg/m. Patient meets criteria for Normal Weight based on current BMI.  Current diet order is Regular, patient is consuming approximately 50-100% of meals at this time. Labs and medications reviewed.  No nutrition interventions warranted at this time. If nutrition issues arise, please consult RD.  Willey Blade, MS, RD, LDN Pager: [phone] After Hours Pager: [phone]

## 2016-11-24 NOTE — Progress Notes (Signed)
PHARMACY - PHYSICIAN COMMUNICATION CRITICAL VALUE ALERT - BLOOD CULTURE IDENTIFICATION (BCID)  Results for orders placed or performed during the hospital encounter of 11/23/16  Blood Culture ID Panel (Reflexed) (Collected: 11/23/2016  4:39 PM)  Result Value Ref Range   Enterococcus species NOT DETECTED NOT DETECTED   Listeria monocytogenes NOT DETECTED NOT DETECTED   Staphylococcus species NOT DETECTED NOT DETECTED   Staphylococcus aureus NOT DETECTED NOT DETECTED   Streptococcus species NOT DETECTED NOT DETECTED   Streptococcus agalactiae NOT DETECTED NOT DETECTED   Streptococcus pneumoniae NOT DETECTED NOT DETECTED   Streptococcus pyogenes NOT DETECTED NOT DETECTED   Acinetobacter baumannii NOT DETECTED NOT DETECTED   Enterobacteriaceae species NOT DETECTED NOT DETECTED   Enterobacter cloacae complex NOT DETECTED NOT DETECTED   Escherichia coli NOT DETECTED NOT DETECTED   Klebsiella oxytoca NOT DETECTED NOT DETECTED   Klebsiella pneumoniae NOT DETECTED NOT DETECTED   Proteus species NOT DETECTED NOT DETECTED   Serratia marcescens NOT DETECTED NOT DETECTED   Haemophilus influenzae NOT DETECTED NOT DETECTED   Neisseria meningitidis NOT DETECTED NOT DETECTED   Pseudomonas aeruginosa NOT DETECTED NOT DETECTED   Candida albicans NOT DETECTED NOT DETECTED   Candida glabrata NOT DETECTED NOT DETECTED   Candida krusei NOT DETECTED NOT DETECTED   Candida parapsilosis NOT DETECTED NOT DETECTED   Candida tropicalis NOT DETECTED NOT DETECTED   Anaerobic bottle: GPC clusters  Name of physician (or Provider) ContactedBaltazar Najjar  Changes to prescribed antibiotics required: none  Peggyann Juba, PharmD, BCPS Pager: [phone] 11/24/2016  8:04 PM

## 2016-11-24 NOTE — Care Management Note (Signed)
Case Management Note  Patient Details  Name: Heidi [name] MRN: [id number] Date of Birth: 01-30-1986  Subjective/Objective:        30 yo admitted with CAP. Hx of Hodgkin's Lymphoma.            Action/Plan: Pt from home alone. She receives home 02 from [facility]. It was confirmed with pt that she has established care at the [facility]. Pt was encouraged to make a follow up appointment there. She was also reminded that they have a pharmacy there where she can receive help with her medications. Pt was given [facility] packet as a resource for phone numbers.  Pt states that her Medicaid application is still pending and she has been calling to follow up. No other CM needs noted at this time. CM will continue to follow.  Expected Discharge Date:   (unkown)               Expected Discharge Plan:  Home/Self Care  In-House Referral:     Discharge planning Services  CM Consult, [facility]  Post Acute Care Choice:    Choice offered to:     DME Arranged:    DME Agency:     HH Arranged:    HH Agency:     Status of Service:  In process, will continue to follow  If discussed at Long Length of Stay Meetings, dates discussed:    Additional CommentsLynnell Catalan, RN 11/24/2016, 9:44 AM  [phone]

## 2016-11-25 DIAGNOSIS — J9601 Acute respiratory failure with hypoxia: Secondary | ICD-10-CM

## 2016-11-25 DIAGNOSIS — D72829 Elevated white blood cell count, unspecified: Secondary | ICD-10-CM

## 2016-11-25 LAB — LEGIONELLA PNEUMOPHILA SEROGP 1 UR AG: L. pneumophila Serogp 1 Ur Ag: NEGATIVE

## 2016-11-25 NOTE — Consult Note (Signed)
Name: Heidi [name] MRN: [id number] DOB: [birth year]    ADMISSION DATE:  11/23/2016 CONSULTATION DATE:  11/30  REFERRING MD :  A. 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 Her last chemo was 10/24 (on AVD w/ bleo on hold d/t lung compromise). She is near completion of her treatment however she has had notable issues w/treatment delays and on-going smoking.  Admitted 11/29 w/ a working dx of CAP and thus placed on empiric rocephin and azith.. Pulmonary was consulted to consider bronchoscopy to 1) r/o endobronchial obstruction and 2) obtain lavage to r/o resistant org.   SIGNIFICANT EVENTS    STUDIES:  CT chest 11/28: Multiple small opacities are noted throughout the left lung concerning for pneumonia. There is nearly complete opacification of right lung most likely representing pneumonia, although infiltrative disease by lymphoma cannot be excluded.  HISTORY OF PRESENT ILLNESS:   This is a 30 year old female w/ known h/o mixed cellularity Hodgkin's lymphoma IV B E w/ extensive LA and BX proven pulmonary involvement. Her last chemo was 10/24 (on AVD w/ bleo on hold d/t lung compromise). She is near completion of her treatment however she has had notable issues w/treatment delays and on-going smoking. She presented to the [facility] on 11/29 w/ cc: 3-4 d h/o cough w/ green sputum and worsening exertional dyspnea. CXR at that time showed extensively consolidative right sided lung disease and she was therefore admitted w/ a working dx of CAP and thus placed on empiric rocephin and azith. She was seen in consult by heme/onc as well as radiation onc who felt based on the CT findings there was concern about possible endobronchial obstruction, but also raising concern for resistant organism given the fact that the pt is an  on-going smoker and is immunosuppressed. Pulmonary was consulted to consider bronchoscopy to 1) r/o endobronchial obstruction and 2) obtain lavage to r/o resistant org.  -->cough actually started around end of October but pt thought it was her allergies. PAST MEDICAL HISTORY :   has a past medical history of Asthma; Hodgkin lymphoma ([place]); and Hypertension.  has a past surgical history that includes Axillary lymph node biopsy (Right, 03/19/2016). Prior to Admission medications   Medication Sig Start Date End Date Taking? Authorizing Provider  dexamethasone (DECADRON) 4 MG tablet 2 tab (8mg ) twice a day for 3 days starting on day of chemotherapy 06/15/16  Yes Brunetta Genera, MD  guaiFENesin (MUCINEX) 600 MG 12 hr tablet Take 1,200 mg by mouth 2 (two) times daily as needed for cough or to loosen phlegm.   Yes Historical Provider, MD  levalbuterol [facility] HFA) 45 MCG/ACT inhaler Inhale 2 puffs into the lungs every 6 (six) hours as needed for wheezing. 03/23/16  Yes Erick Colace, NP  lidocaine-prilocaine (EMLA) cream Apply 1 application topically as needed (numbing).    Yes Historical Provider, MD  loratadine (CLARITIN) 10 MG tablet Take 1 tablet (10 mg total) by mouth daily. For up to 5 days following Neulasta injection. 06/15/16  Yes Brunetta Genera, MD  ondansetron (ZOFRAN) 8 MG tablet Take 1 tablet (8 mg total) by mouth every 8 (eight) hours as needed for nausea (start on the 3rd day after chemotherapy). 05/03/16  Yes Brunetta Genera, MD  prochlorperazine (COMPAZINE) 10 MG tablet Take 1 tablet (10  mg total) by mouth every 6 (six) hours as needed for nausea or vomiting. 05/03/16  Yes Gautam Juleen China, MD  metoprolol (LOPRESSOR) 50 MG tablet Take 1 tablet (50 mg total) by mouth 2 (two) times daily. Patient not taking: Reported on 11/23/2016 04/26/16   Verlee Monte, MD   No Known Allergies  FAMILY HISTORY:  family history is not on file. SOCIAL HISTORY:  reports that she quit smoking  about 7 months ago. Her smoking use included Cigarettes. She has a 7.50 pack-year smoking history. She has never used smokeless tobacco. She reports that she drinks alcohol.  REVIEW OF SYSTEMS:   Constitutional: Negative for fever, chills, weight loss, +malaise/fatigue and diaphoresis. No sick exposures  HENT: Negative for hearing loss, ear pain, nosebleeds, congestion, sore throat, neck pain, tinnitus and ear discharge.   Eyes: Negative for blurred vision, double vision, photophobia, pain, discharge and redness.  Respiratory: + cough w/sputum production, green mucous shortness of breath, wheezing and stridor.   Cardiovascular: Negative for chest pain, palpitations, orthopnea, claudication, leg swelling and PND.  Gastrointestinal: Negative for heartburn, nausea, vomiting, abdominal pain, diarrhea, constipation, blood in stool and melena.  Genitourinary: Negative for dysuria, urgency, frequency, hematuria and flank pain.  Musculoskeletal: Negative for myalgias, back pain, joint pain and falls.  Skin: Negative for itching and rash.  Neurological: Negative for dizziness, tingling, tremors, sensory change, speech change, focal weakness, seizures, loss of consciousness, weakness and headaches.  Endo/Heme/Allergies: Negative for environmental allergies and polydipsia. Does not bruise/bleed easily.  SUBJECTIVE:  Feels a little better VITAL SIGNS: Temp:  [98.6 F (37 C)-100.2 F (37.9 C)] 98.6 F (37 C) (11/30 0456) Pulse Rate:  [52-110] 110 (11/30 0456) Resp:  [18] 18 (11/30 0456) BP: (114-123)/(70-84) 122/75 (11/30 0456) SpO2:  [91 %-94 %] 91 % (11/30 0456) 2 liters  PHYSICAL EXAMINATION: General:  30 year old female, resting comfortably in bed. No distress. Neuro:  Awake,oriented, no focal def. HEENT:  NCAT, no JVD Cardiovascular:  rrr no mrg  Lungs:  Scattered rhonchi no accessory use decreased on right Abdomen:  Soft not tender +bowel sounds  Musculoskeletal:  Equal st and bulk  Skin:   Warm and intact    Recent Labs Lab 11/23/16 0914  NA 132*  K 3.8  CL 96*  CO2 28  BUN <5*  CREATININE 0.60  GLUCOSE 115*    Recent Labs Lab 11/23/16 0914  HGB 11.8*  HCT 35.9*  WBC 11.9*  PLT 608*   No results found.  ASSESSMENT / PLAN:  H/o hodgkins lymphoma w/ Extensive lymphadenopathy and bx proven pulmonary involvement. Probable post-obstructive PNA vs atx  Discussion: Highly suspicious for worsening pulmonary involvement and endobronchial obstruction.  Plan Cont abx Plan for NPO and bronch in am We will obtain bx and lavage at that point as well to ensure no resistant org.   Erick Colace ACNP-BC [place] Pager # [phone] OR # [phone] if no answer   11/25/2016, 2:32 PM

## 2016-11-25 NOTE — Progress Notes (Signed)
Patient ID: Heidi [name], female   DOB: Dec 26, 1986, 30 y.o.   MRN: [id number]  PROGRESS NOTE    Heidi [name]  [id number] DOB: Oct 20, 1986 DOA: 11/23/2016  PCP: Maren Reamer, MD   Brief Narrative:  30 y.o.femalewith Hodgkin's lymphoma with last chemotherapy approximately one month ago. Patient has not been able to go due to problems with transportation. Patient presented to [facility] with 2-3 days of shortness of breath and sputum production. Her CT angio chest showed no pulmonary embolism, stable mediastinal adenopathy consistent with h/o lymphoma. Multiple small opacities were noted throughout the left lung concerning for pneumonia with nearly complete opacification of right lung most likely representing pneumonia, although infiltrative disease by lymphoma cannot be excluded. [facility] to see if bronchoscopy indicated.   Assessment & Plan:   Active Problems:   Acute respiratory failure with hypoxia (HCC) / Mediastinal adenopathy / Infiltrative disease by lymphoma / Lobar pneumonia / Leukocytosis  - CT angio chest showed no pulmonary embolism, stable mediastinal adenopathy consistent with h/o lymphoma. Multiple small opacities were noted throughout the left lung concerning for pneumonia with nearly complete opacification of right lung most likely representing pneumonia, although infiltrative disease by lymphoma cannot be excluded. [facility] to see if bronchoscopy indicated.  - For now continue azithromycin and rocephin  - Continue bronchodilators as needed for shortness of breath or wheezing  - Her resp status is stable at this time, no further fevers. Blood cx x 1 so far neg so would not broaden the abx coverage at this time. 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 Irene Limbo, has seen the pt in consultation 11/28,  recommended pulm consultation for possible bronch  - Patient has completed cycle 6day 1 AVD (bleomycin on hold due to lung compromise) - Her last chemotherapy was on 10/19/2016 and she missed her cycle 6 day 15 on 11/16/2016    Thrombocytosis - Likely reactive - Follow up CBC in am    Hyponatremia - Appears to be prerenal in etiology - Follow up BMP in am    Anemia of chronic disease - Due to lymphoma - Hgb 11.8, stable    DVT prophylaxis: Heparin SQ Code Status: Full Family Communication: Family not at the bedside this am Disposition Plan: depending on   Consultants:   Oncology  Pulmonary   Procedures:   None  Antimicrobials:   Zithromax 11/23/2016 -->  Rocephin 11/23/2016 -->   Subjective: No overnight events. Feels very tired this am.   Objective: Vitals:   11/24/16 1501 11/24/16 2110 11/25/16 0147 11/25/16 0456  BP: 114/70 123/84  122/75  Pulse: (!) 52 94  (!) 110  Resp: 18 18  18   Temp: 100.2 F (37.9 C) 98.8 F (37.1 C)  98.6 F (37 C)  TempSrc: Oral Oral  Oral  SpO2: 93% 93% 94% 91%  Weight:      Height:        Intake/Output Summary (Last 24 hours) at 11/25/16 1414 Last data filed at 11/25/16 0815  Gross per 24 hour  Intake              120 ml  Output                0 ml  Net  120 ml   Filed Weights   11/23/16 0844  Weight: 65.8 kg (145 lb)    Examination:  General exam: Appears calm and comfortable  Respiratory system: Respiratory effort normal. No wheezing  Cardiovascular system: S1 & S2 heard, Rate controlled  Gastrointestinal system: Abdomen is nondistended, soft and nontender. No organomegaly or masses felt. Normal bowel sounds heard. Central nervous system: Alert and oriented. No focal neurological deficits. Extremities: Symmetric 5 x 5 power. Skin: No rashes, lesions or ulcers Psychiatry: Judgement and insight appear normal. Mood & affect appropriate.   Data Reviewed: I have personally reviewed following  labs and imaging studies  CBC:  Recent Labs Lab 11/23/16 0914  WBC 11.9*  NEUTROABS 10.6*  HGB 11.8*  HCT 35.9*  MCV 92.8  PLT [id number]*   Basic Metabolic Panel:  Recent Labs Lab 11/23/16 0914  NA 132*  K 3.8  CL 96*  CO2 28  GLUCOSE 115*  BUN <5*  CREATININE 0.60  CALCIUM 7.8*   GFR: Estimated Creatinine Clearance: 99.1 mL/min (by C-G formula based on SCr of 0.6 mg/dL). Liver Function Tests:  Recent Labs Lab 11/23/16 0914  AST 19  ALT 9*  ALKPHOS 59  BILITOT 0.6  PROT 5.0*  ALBUMIN 2.1*   No results for input(s): LIPASE, AMYLASE in the last 168 hours. No results for input(s): AMMONIA in the last 168 hours. Coagulation Profile: No results for input(s): INR, PROTIME in the last 168 hours. Cardiac Enzymes: No results for input(s): CKTOTAL, CKMB, CKMBINDEX, TROPONINI in the last 168 hours. BNP (last 3 results) No results for input(s): PROBNP in the last 8760 hours. HbA1C: No results for input(s): HGBA1C in the last 72 hours. CBG: No results for input(s): GLUCAP in the last 168 hours. Lipid Profile: No results for input(s): CHOL, HDL, LDLCALC, TRIG, CHOLHDL, LDLDIRECT in the last 72 hours. Thyroid Function Tests: No results for input(s): TSH, T4TOTAL, FREET4, T3FREE, THYROIDAB in the last 72 hours. Anemia Panel: No results for input(s): VITAMINB12, FOLATE, FERRITIN, TIBC, IRON, RETICCTPCT in the last 72 hours. Urine analysis:    Component Value Date/Time   COLORURINE AMBER BIOCHEMICALS MAY BE AFFECTED BY COLOR (A) 08/01/2009 1305   APPEARANCEUR CLEAR 08/01/2009 1305   LABSPEC 1.029 08/01/2009 1305   PHURINE 6.5 08/01/2009 1305   GLUCOSEU NEGATIVE 08/01/2009 1305   HGBUR NEGATIVE 08/01/2009 1305   BILIRUBINUR SMALL (A) 08/01/2009 1305   KETONESUR 15 (A) 08/01/2009 1305   PROTEINUR NEGATIVE 08/01/2009 1305   UROBILINOGEN 1.0 08/01/2009 1305   NITRITE NEGATIVE 08/01/2009 1305   LEUKOCYTESUR SMALL (A) 08/01/2009 1305   Sepsis  Labs: @LABRCNTIP (procalcitonin:4,lacticidven:4) Results for orders placed or performed during the hospital encounter of 11/23/16  Culture, blood (routine x 2) Call MD if unable to obtain prior to antibiotics being given     Status: None (Preliminary result)   Collection Time: 11/23/16  4:39 PM  Result Value Ref Range Status   Specimen Description BLOOD BLOOD LEFT FOREARM  Final   Special Requests BOTTLES DRAWN AEROBIC AND ANAEROBIC 5 CC EA  Final   Culture  Setup Time   Final    GRAM POSITIVE COCCI IN CLUSTERS ANAEROBIC BOTTLE ONLY Organism ID to follow CRITICAL RESULT CALLED TO, READ BACK BY AND VERIFIED WITH: [place] 11/24/16 A BROWNING    Culture   Final    GRAM POSITIVE COCCI CULTURE REINCUBATED FOR BETTER GROWTH Performed at [facility]    Report Status PENDING  Incomplete  Blood Culture ID Panel (Reflexed)  Status: None   Collection Time: 11/23/16  4:39 PM  Result Value Ref Range Status   Enterococcus species NOT DETECTED NOT DETECTED Final   Listeria monocytogenes NOT DETECTED NOT DETECTED Final   Staphylococcus species NOT DETECTED NOT DETECTED Final   Staphylococcus aureus NOT DETECTED NOT DETECTED Final   Streptococcus species NOT DETECTED NOT DETECTED Final   Streptococcus agalactiae NOT DETECTED NOT DETECTED Final   Streptococcus pneumoniae NOT DETECTED NOT DETECTED Final   Streptococcus pyogenes NOT DETECTED NOT DETECTED Final   Acinetobacter baumannii NOT DETECTED NOT DETECTED Final   Enterobacteriaceae species NOT DETECTED NOT DETECTED Final   Enterobacter cloacae complex NOT DETECTED NOT DETECTED Final   Escherichia coli NOT DETECTED NOT DETECTED Final   Klebsiella oxytoca NOT DETECTED NOT DETECTED Final   Klebsiella pneumoniae NOT DETECTED NOT DETECTED Final   Proteus species NOT DETECTED NOT DETECTED Final   Serratia marcescens NOT DETECTED NOT DETECTED Final   Haemophilus influenzae NOT DETECTED NOT DETECTED Final   Neisseria  meningitidis NOT DETECTED NOT DETECTED Final   Pseudomonas aeruginosa NOT DETECTED NOT DETECTED Final   Candida albicans NOT DETECTED NOT DETECTED Final   Candida glabrata NOT DETECTED NOT DETECTED Final   Candida krusei NOT DETECTED NOT DETECTED Final   Candida parapsilosis NOT DETECTED NOT DETECTED Final   Candida tropicalis NOT DETECTED NOT DETECTED Final    Comment: Performed at [facility]  Culture, blood (routine x 2) Call MD if unable to obtain prior to antibiotics being given     Status: None (Preliminary result)   Collection Time: 11/23/16  5:02 PM  Result Value Ref Range Status   Specimen Description BLOOD LEFT ARM  Final   Special Requests BOTTLES DRAWN AEROBIC AND ANAEROBIC [place]  Final   Culture   Final    NO GROWTH < 24 HOURS Performed at [facility]    Report Status PENDING  Incomplete  MRSA PCR Screening     Status: None   Collection Time: 11/23/16  5:36 PM  Result Value Ref Range Status   MRSA by PCR NEGATIVE NEGATIVE Final    Comment:        The GeneXpert MRSA Assay (FDA approved for NASAL specimens only), is one component of a comprehensive MRSA colonization surveillance program. It is not intended to diagnose MRSA infection nor to guide or monitor treatment for MRSA infections.      Radiology Studies: Dg Chest 2 View Result Date: 11/23/2016 Extensive right lung consolidation, increased from 06/2016.   Ct Angio Chest Pe W/cm &/or Wo Cm Result Date: 11/23/2016 No definite evidence of pulmonary embolus. Stable mediastinal adenopathy consistent with history of lymphoma. Multiple small opacities are noted throughout the left lung concerning for pneumonia. There is nearly complete opacification of right lung most likely representing pneumonia, although infiltrative disease by lymphoma cannot be excluded.    Scheduled Meds: . azithromycin  500 mg Intravenous Q24H  . cefTRIAXone (ROCEPHIN)  IV  1 g Intravenous Q24H  . guaiFENesin  600 mg Oral  BID  . heparin  5,000 Units Subcutaneous Q8H  . sodium chloride flush  3 mL Intravenous Q12H   Continuous Infusions:   LOS: 2 days    Time spent: 25 minutes  Greater than 50% of the time spent on counseling and coordinating the care.   [name], MD [facility] Pager [phone]  If 7PM-7AM, please contact night-coverage [email or web address] Password [email or web address] 11/25/2016, 2:14 PM

## 2016-11-26 ENCOUNTER — Encounter (HOSPITAL_COMMUNITY)
Admission: EM | Discharge status: Against Medical Advice | Payer: Self-pay | Source: Home / Self Care | Attending: Internal Medicine

## 2016-11-26 ENCOUNTER — Inpatient Hospital Stay (HOSPITAL_COMMUNITY): Payer: Medicaid Other

## 2016-11-26 ENCOUNTER — Encounter (HOSPITAL_COMMUNITY): Payer: Self-pay | Admitting: Internal Medicine

## 2016-11-26 ENCOUNTER — Ambulatory Visit (HOSPITAL_COMMUNITY): Payer: Medicaid Other

## 2016-11-26 DIAGNOSIS — D649 Anemia, unspecified: Secondary | ICD-10-CM | POA: Diagnosis present

## 2016-11-26 DIAGNOSIS — D638 Anemia in other chronic diseases classified elsewhere: Secondary | ICD-10-CM | POA: Diagnosis present

## 2016-11-26 DIAGNOSIS — J181 Lobar pneumonia, unspecified organism: Secondary | ICD-10-CM

## 2016-11-26 DIAGNOSIS — Z9889 Other specified postprocedural states: Secondary | ICD-10-CM

## 2016-11-26 HISTORY — PX: VIDEO BRONCHOSCOPY: SHX5072

## 2016-11-26 LAB — CULTURE, BLOOD (ROUTINE X 2)

## 2016-11-26 LAB — BODY FLUID CELL COUNT WITH DIFFERENTIAL
LYMPHS FL: 7 %
MONOCYTE-MACROPHAGE-SEROUS FLUID: 1 % — AB (ref 50–90)
Neutrophil Count, Fluid: 92 % — ABNORMAL HIGH (ref 0–25)
Total Nucleated Cell Count, Fluid: 790 cu mm (ref 0–1000)

## 2016-11-26 SURGERY — BRONCHOSCOPY, WITH FLUOROSCOPY
Anesthesia: Moderate Sedation | Laterality: Bilateral

## 2016-11-26 MED ORDER — ENOXAPARIN SODIUM 40 MG/0.4ML ~~LOC~~ SOLN: 40.0000 mg | SUBCUTANEOUS | Status: DC

## 2016-11-26 MED ORDER — FENTANYL CITRATE (PF) 100 MCG/2ML IJ SOLN
INTRAMUSCULAR | Status: DC | PRN
  Administered 2016-11-26 (×6): 25 ug via INTRAVENOUS

## 2016-11-26 MED ORDER — MIDAZOLAM HCL 10 MG/2ML IJ SOLN
INTRAMUSCULAR | Status: DC | PRN
  Administered 2016-11-26 (×4): 1 mg via INTRAVENOUS

## 2016-11-26 MED ORDER — BUTAMBEN-TETRACAINE-BENZOCAINE 2-2-14 % EX AERO: 1.0000 | INHALATION_SPRAY | Freq: Once | CUTANEOUS | Status: DC

## 2016-11-26 MED ORDER — LIDOCAINE HCL 2 % EX GEL
CUTANEOUS | Status: DC | PRN
  Administered 2016-11-26: 1

## 2016-11-26 MED ORDER — PHENYLEPHRINE HCL 0.25 % NA SOLN
NASAL | Status: DC | PRN
  Administered 2016-11-26: 2 via NASAL

## 2016-11-26 MED ORDER — LIDOCAINE HCL 2 % EX GEL: 1.0000 "application " | Freq: Once | CUTANEOUS | Status: DC

## 2016-11-26 MED ORDER — MIDAZOLAM HCL 5 MG/ML IJ SOLN
INTRAMUSCULAR | Status: AC
  Filled 2016-11-26: qty 2

## 2016-11-26 MED ORDER — SODIUM CHLORIDE 0.9 % IV SOLN
INTRAVENOUS | Status: DC
  Administered 2016-11-26: 07:00:00 via INTRAVENOUS

## 2016-11-26 MED ORDER — FENTANYL CITRATE (PF) 100 MCG/2ML IJ SOLN
INTRAMUSCULAR | Status: AC
  Filled 2016-11-26: qty 4

## 2016-11-26 MED ORDER — PHENYLEPHRINE HCL 0.25 % NA SOLN: 1.0000 | Freq: Four times a day (QID) | NASAL | Status: DC | PRN

## 2016-11-26 MED ORDER — DIPHENHYDRAMINE HCL 50 MG/ML IJ SOLN
12.5000 mg | Freq: Once | INTRAMUSCULAR | Status: AC
  Administered 2016-11-26: 12.5 mg via INTRAVENOUS
  Filled 2016-11-26: qty 1

## 2016-11-26 MED ORDER — LIDOCAINE HCL 1 % IJ SOLN
INTRAMUSCULAR | Status: DC | PRN
  Administered 2016-11-26: 6 mL via RESPIRATORY_TRACT

## 2016-11-26 NOTE — Progress Notes (Signed)
[name]   HEMATOLOGY/ONCOLOGY INPATIENT PROGRESS NOTE  Date of Service: 11/25/2016 Inpatient Attending: .Venetia Maxon Rama, MD   SUBJECTIVE  Patient was seen late last evening on 11/25/2016. She notes that her breathing is somewhat better and she is sitting up comfortably in bed without actively needing oxygen. She notes some decreased cough and secretions.  Has been seen by pulmonary and she'll be getting a bronchoscopy on 11/26/2016. No chest pain no fevers or chills. No other acute new symptoms.  OBJECTIVE:  NAD on oxygen at rest  PHYSICAL EXAMINATION: . Vitals:   11/26/16 0805 11/26/16 0810 11/26/16 0815 11/26/16 0820  BP: (!) 161/83 (!) 178/86 (!) 176/95   Pulse:      Resp: (!) 23 (!) 22 (!) 23 (!) 22  Temp:      TempSrc:      SpO2: 99% 92% 94% 95%  Weight:      Height:       Filed Weights   11/23/16 0844  Weight: 145 lb (65.8 kg)   .Body mass index is 22.71 kg/m.  GENERAL:alert, in no acute distress and comfortable SKIN: No acute rashes EYES: normal, conjunctiva are pink and non-injected, sclera clear OROPHARYNX: Moist mucous membranes NECK: supple, no JVD, thyroid normal size, non-tender, without nodularity LYMPH:  no palpable lymphadenopathy in the cervical, axillary or inguinal LUNGS: Decreased air entry right base with scattered rales on the right side, good air entry on the left HEART: regular rate & rhythm,  no murmurs and no lower extremity edema ABDOMEN: abdomen soft, non-tender, normoactive bowel sounds  Musculoskeletal: no cyanosis of digits and no clubbing  PSYCH: alert & oriented x 3 with fluent speech NEURO: no focal motor/sensory deficits  MEDICAL HISTORY:  Past Medical History:  Diagnosis Date  . Asthma   . Hodgkin lymphoma ([place])   . Hypertension     SURGICAL HISTORY: Past Surgical History:  Procedure Laterality Date  . AXILLARY LYMPH NODE BIOPSY Right 03/19/2016   Procedure: AXILLARY LYMPH NODE BIOPSY;  Surgeon: Armandina Gemma, MD;  Location:  [facility];  Service: General;  Laterality: Right;    SOCIAL HISTORY: Social History   Social History  . Marital status: Single    Spouse name: N/A  . Number of children: N/A  . Years of education: N/A   Occupational History  . Not on file.   Social History Main Topics  . Smoking status: Former Smoker    Packs/day: 0.50    Years: 15.00    Types: Cigarettes    Quit date: 03/27/2016  . Smokeless tobacco: Never Used  . Alcohol use Yes     Comment: occasional now  . Drug use: Unknown  . Sexual activity: Not on file   Other Topics Concern  . Not on file   Social History Narrative  . No narrative on file    FAMILY HISTORY: No family history on file.  ALLERGIES:  has No Known Allergies.  MEDICATIONS:  Scheduled Meds: . azithromycin  500 mg Intravenous Q24H  . cefTRIAXone (ROCEPHIN)  IV  1 g Intravenous Q24H  . guaiFENesin  600 mg Oral BID  . heparin  5,000 Units Subcutaneous Q8H  . sodium chloride flush  3 mL Intravenous Q12H   Continuous Infusions: . sodium chloride 10 mL/hr at 11/26/16 0648   PRN Meds:.sodium chloride, diphenhydrAMINE, guaiFENesin-dextromethorphan, ipratropium-albuterol, sodium chloride flush  REVIEW OF SYSTEMS:    10 Point review of Systems was done is negative except as noted above.   LABORATORY DATA:  I have reviewed the data as listed  . CBC Latest Ref Rng & Units 11/23/2016 10/19/2016 09/29/2016  WBC 4.0 - 10.5 K/uL 11.9(H) 11.7(H) 5.6  Hemoglobin 12.0 - 15.0 g/dL 11.8(L) 12.2 11.8  Hematocrit 36.0 - 46.0 % 35.9(L) 36.8 35.5  Platelets 150 - 400 K/uL 608(H) 609(H) 308    . CMP Latest Ref Rng & Units 11/23/2016 10/19/2016 09/29/2016  Glucose 65 - 99 mg/dL 115(H) 108 141(H)  BUN 6 - 20 mg/dL <5(L) 5.2(L) 4.3(L)  Creatinine 0.44 - 1.00 mg/dL 0.60 0.7 0.7  Sodium 135 - 145 mmol/L 132(L) 135(L) 135(L)  Potassium 3.5 - 5.1 mmol/L 3.8 4.0 3.7  Chloride 101 - 111 mmol/L 96(L) - -  CO2 22 - 32 mmol/L 28 24 24   Calcium 8.9 - 10.3 mg/dL  7.8(L) 9.2 8.9  Total Protein 6.5 - 8.1 g/dL 5.0(L) 6.6 6.3(L)  Total Bilirubin 0.3 - 1.2 mg/dL 0.6 0.58 0.69  Alkaline Phos 38 - 126 U/L 59 98 111  AST 15 - 41 U/L 19 14 15   ALT 14 - 54 U/L 9(L) 10 13     RADIOGRAPHIC STUDIES: I have personally reviewed the radiological images as listed and agreed with the findings in the report. Dg Chest 2 View  Result Date: 11/23/2016 CLINICAL DATA:  Worsening coughing congestion over the past week. History of Hodgkin's lymphoma. EXAM: CHEST  2 VIEW COMPARISON:  PET-CT 07/22/2016.  Chest radiographs 04/21/2016. FINDINGS: Right jugular Port-A-Cath terminates in the region of the bright brachiocephalic vein as on the prior PET-CT. The cardiac silhouette again appears mildly enlarged, though the right heart border is obscured. There is mild patchy opacity in the left mid lung which has greatly improved from the 03/2016 chest radiographs and is similar to the more recent PET-CT. There is extensive consolidation throughout much of the right lung which is similar to the prior radiographs but substantially worsened compared to the more recent PET-CT. An underlying right pleural effusion is not excluded. No pneumothorax. No acute osseous abnormality. IMPRESSION: Extensive right lung consolidation, increased from 06/2016. Electronically Signed   By: Logan Bores M.D.   On: 11/23/2016 09:36   Ct Angio Chest Pe W/cm &/or Wo Cm  Result Date: 11/23/2016 CLINICAL DATA:  Cough.  History of Hodgkin's lymphoma. EXAM: CT ANGIOGRAPHY CHEST WITH CONTRAST TECHNIQUE: Multidetector CT imaging of the chest was performed using the standard protocol during bolus administration of intravenous contrast. Multiplanar CT image reconstructions and MIPs were obtained to evaluate the vascular anatomy. CONTRAST:  100 mL of Isovue 370 intravenously. PET scan of July 22, 2016. COMPARISON:  CT scan of April 10, 2016. FINDINGS: Cardiovascular: There is no evidence of thoracic aortic dissection or  aneurysm. There is no definite evidence of pulmonary embolus. Mediastinum/Nodes: Mediastinal adenopathy is noted consistent with a history of lymphoma. Right internal jugular Port-A-Cath is noted which is looped within right internal jugular vein more superiorly, in distal tip is located in right brachiocephalic vein. Lungs/Pleura: Multifocal opacities are noted in the left lung which may represent pneumonia. There is nearly complete opacification of the right lung which is significantly worse compared to prior PET scan, most consistent with pneumonia. No pneumothorax is noted. Upper Abdomen: No definite abnormality seen in visualized portion of upper abdomen. Musculoskeletal: No significant osseous abnormality is noted. Review of the MIP images confirms the above findings. IMPRESSION: No definite evidence of pulmonary embolus. Stable mediastinal adenopathy consistent with history of lymphoma. Multiple small opacities are noted throughout the left lung concerning for  pneumonia. There is nearly complete opacification of right lung most likely representing pneumonia, although infiltrative disease by lymphoma cannot be excluded. Electronically Signed   By: Marijo Conception, M.D.   On: 11/23/2016 11:40   Dg Chest Port 1 View  Result Date: 11/26/2016 CLINICAL DATA:  Status post bronchoscopy and right lower lobe biopsy. History of Hodgkin's lymphoma EXAM: PORTABLE CHEST 1 VIEW COMPARISON:  November 23, 2016 chest CT and chest radiograph FINDINGS: No pneumothorax. Consolidation throughout most of the right lung remains. There is patchy infiltrate in the left upper lobe. No new opacity evident compared to recent studies. Heart size is within normal limits. Pulmonary vascularity is obscured on the right and grossly normal on the left. Port-A-Cath tip is in the superior vena cava, unchanged. Right paratracheal adenopathy is stable. IMPRESSION: No pneumothorax. Widespread consolidation on the right and patchy infiltrate  left upper lobe remain stable. Stable cardiac silhouette. There is right paratracheal adenopathy. Stable Port-A-Cath position. Electronically Signed   By: Lowella Grip III M.D.   On: 11/26/2016 08:26   PET/CT 07/22/2016 IMPRESSION: Near complete metabolic response to therapy of nodal disease within the neck, chest, and abdomen, with only minimal persistent hypermetabolic lymphadenopathy in the abdominal retroperitoneum. Significant improvement in hypermetabolic bilateral pulmonary airspace disease, right side greater than left. Electronically Signed   By: Earle Gell M.D.   On: 07/22/2016 17:11  ASSESSMENT & PLAN:    30 year old African-American female with  #1 Mixed cellularity Hodgkin's lymphoma IV B E with extensive lymphadenopathy including right axillary, mediastinal and upper retroperitoneal and biopsy proven pulmonary involvement. She was noted to have significant constitutional symptoms including significant weight loss, fevers chills and some night sweats. HIV negative Hepatitis C and hepatitis B serologies negative. Echo with normal ejection fraction. PET/CT scan after 3 cycles of AVD had shown near complete metabolic response of the nodal disease within the neck chest and abdomen with only minimal persistent hypermetabolic lymphadenopathy in the retroperitoneum.  Patient has completed cycle 6 day 1 AVD (bleomycin on hold due to lung compromise) DLCO on her pulmonary function tests are 37 percent suggesting severe reduction. And was still actively smoking. Patient has had multiple treatment delays especially in her fifth cycle due to avoidable reasons including transportation issues. Thus far had not had any complications from treatment. Was receiving G-CSF support with her AVD due to issues with initial recurrent pneumonias.  Her last chemotherapy was on 10/19/2016 and she missed her cycle 6 day 15 on 11/16/2016. Currently not neutropenic #2 active smoker   #3  currently admitted to the hospital with distant exertion shortness of breath and new productive cough. Dense extensive right lung consolidation and a few left lung infiltrates. Her acute presentation and normal sedimentation rate suggest against this being only CHL shouldn't have very elevated sedimentation rates even initially.  She had nearly completed her treatment for [facility] though had multiple treatment delays due to her compliance issues. Though not neutropenic she is immunocompromised due to her chemotherapy and would need to consider a broader possibility of infectious issues.  Considerations at this time are pneumonia versus progression of her Hodgkin's lymphoma . Plan -The patient pulmonology input. Patient has been scheduled to have a bronchoscopy on 11/26/2016. -Continue antibiotics as per hospitalist and adjust based on culture results. Low threshold to broaden her antibiotic coverage in her immunosuppressed state. -If there is evidence of persistent/relapsed Hodgkin's lymphoma with obstructive lung finding would need to consider radiation oncology input and alternative systemic therapy options. -  I shall follow up on her bronchoscopy pathology results on Monday.  -Appreciate excellent care of her hospital medicine team.   I spent 35 minutes counseling the patient face to face. The total time spent in the appointment was 35 minutes and more than 50% was on counseling and direct patient cares.    Sullivan Lone MD [place] AAHIVMS [facility] Hematology/Oncology Physician [facility]  (Office):       [phone] (Work cell):  [phone] (Fax):           [phone]

## 2016-11-26 NOTE — Progress Notes (Signed)
Video bronchoscopy performed Intervention bronchial washings Intervention bronchial biopsies Pt tolerated well  Win Guajardo David RRT  

## 2016-11-26 NOTE — Op Note (Signed)
[facility] Cardiopulmonary Patient Name: Heidi [name] Procedure Date: 11/26/2016 MRN: [id number] Attending MD: Leanna Sato. Elsworth Soho MD, MD Date of Birth: 1986/10/10 CSN: [id number] Age: 30 Admit Type: Inpatient Ethnicity: Not Hispanic or Latino Procedure:            Bronchoscopy Indications:          Diffuse lung disease, Right middle lobe pneumonia,                        Right lower lobe pneumonia Providers:            Leanna Sato. Elsworth Soho MD, MD, Tammie Readling RRT,RCP, Ashley Mariner RRT,RCP Referring MD:          Medicines:            Fentanyl 150 mcg IV, Midazolam 4 mg IV Complications:        No immediate complications. Estimated blood loss:                        Minimal Estimated Blood Loss: Estimated blood loss was minimal. Procedure:      Pre-Anesthesia Assessment:      - A History and Physical has been performed. Patient meds and allergies       have been reviewed. The risks and benefits of the procedure and the       sedation options and risks were discussed with the patient. All       questions were answered and informed consent was obtained. Patient       identification and proposed procedure were verified prior to the       procedure in the procedure room. Mental Status Examination: normal.       Airway Examination: normal oropharyngeal airway and Mallampati Class II       (the uvula but not tonsillar pillars visualized). CV Examination:       normal. ASA Grade Assessment: II - A patient with mild systemic disease.       After reviewing the risks and benefits, the patient was deemed in       satisfactory condition to undergo the procedure. The anesthesia plan was       to use moderate sedation / analgesia (conscious sedation). Immediately       prior to administration of medications, the patient was re-assessed for       adequacy to receive sedatives. The heart rate, respiratory rate, oxygen       saturations, blood pressure,  adequacy of pulmonary ventilation, and       response to care were monitored throughout the procedure. The physical       status of the patient was re-assessed after the procedure.      After obtaining informed consent, the bronchoscope was passed under       direct vision. Throughout the procedure, the patient's blood pressure,       pulse, and oxygen saturations were monitored continuously. the [id number]       [id number]) scope was introduced through the right nostril and advanced to       the tracheobronchial tree. The patient tolerated the procedure fairly       well. Findings:      Respiratory tract: The larynx is normal. The vocal cords appear normal.  The subglottic space is normal. The trachea is of normal caliber. The       carina is sharp. The entire tracheobronchial tree was examined to at       least the first subsegmental level. Bronchial mucosa and anatomy are       normal; there are no endobronchial lesions and no secretions, except in       the right lower lobe.      Transbronchial biopsies of an area of infiltration were performed in the       right lower lobe using forceps and sent for cell count, bacterial       culture, viral smears & culture, and fungal & AFB analysis and cytology.       The procedure was guided by fluoroscopy. Transbronchial biopsy technique       was selected because the sampling site was not visible endoscopically.       The sampling device penetrated the full thickness of the bronchial wall       to obtain the biopsy of lung tissue. Six biopsy passes were performed.       Six biopsy samples were obtained.      Bronchoalveolar lavage was performed in the right lower lobe of the lung       and sent for cell count, bacterial culture, viral smears & culture, and       fungal & AFB analysis and cytology. The return was bloody. There were no       mucoid plugs in the return fluid. Impression:      - Diffuse lung disease      - Right middle lobe  pneumonia      - Right lower lobe pneumonia      - Transbronchial lung biopsies were performed.      - Bronchoalveolar lavage was performed. Moderate Sedation:      Moderate (conscious) sedation was personally administered by the       endoscopist. The following parameters were monitored: oxygen saturation,       heart rate, blood pressure, and response to care. Total physician       intraservice time was 19 minutes. Recommendation:      - Await BAL and biopsy results. Procedure Code(s):      --- Professional ---      [phone], Bronchoscopy, rigid or flexible, including fluoroscopic guidance,       when performed; with transbronchial lung biopsy(s), single lobe      [id number], Bronchoscopy, rigid or flexible, including fluoroscopic guidance,       when performed; with bronchial alveolar lavage      99152, Moderate sedation services provided by the same physician or       other qualified health care professional performing the diagnostic or       therapeutic service that the sedation supports, requiring the presence       of an independent trained observer to assist in the monitoring of the       patient's level of consciousness and physiological status; initial 15       minutes of intraservice time, patient age 67 years or older Diagnosis Code(s):      --- Professional ---      R91.8, Other nonspecific abnormal finding of lung field      J18.9, Pneumonia, unspecified organism CPT copyright 2016 [employer] Association. All rights reserved. The codes documented in this report are preliminary and upon coder review may  be revised to  meet current compliance requirements. Kara Mead, MD Leanna Sato Elsworth Soho MD, MD 11/26/2016 7:45:43 AM This report has been signed electronically. Number of Addenda: 0 Scope In: 7:24:49 AM Scope Out: [id number] AM

## 2016-11-26 NOTE — Progress Notes (Signed)
   Name: Heidi [name] MRN: [id number] DOB: 30-Nov-1986    ADMISSION DATE:  11/23/2016 CONSULTATION DATE:  11/30  REFERRING MD :  A. 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 Her last chemo was 10/24 (on AVD w/ bleo on hold d/t lung compromise). She is near completion of her treatment however she has had notable issues w/treatment delays and on-going smoking.  Admitted 11/29 w/ a working dx of CAP and thus placed on empiric rocephin and azith.. Pulmonary was consulted to consider bronchoscopy to 1) r/o endobronchial obstruction and 2) obtain lavage to r/o resistant org.   SIGNIFICANT EVENTS    STUDIES:  CT chest 11/28: Multiple small opacities are noted throughout the left lung concerning for pneumonia. There is nearly complete opacification of right lung most likely representing pneumonia, although infiltrative disease by lymphoma cannot be excluded.   SUBJECTIVE:  C/o dyspnea No pain Dry cough   VITAL SIGNS: Temp:  [100.3 F (37.9 C)-100.9 F (38.3 C)] 100.9 F (38.3 C) (12/01 0534) Pulse Rate:  [97-126] 113 (12/01 0534) Resp:  [16-31] 24 (12/01 0745) BP: (123-183)/(77-116) 179/97 (12/01 0745) SpO2:  [89 %-99 %] 95 % (12/01 0745) 2 liters  PHYSICAL EXAMINATION: General:  30 year old female, resting comfortably in bed. No distress. Neuro:  Awake,oriented, no focal def. HEENT:  NCAT, no JVD Cardiovascular:  rrr no mrg  Lungs:  Scattered rhonchi no accessory use decreased on right Abdomen:  Soft not tender +bowel sounds  Musculoskeletal:  Equal st and bulk  Skin:  Warm and intact    Recent Labs Lab 11/23/16 0914  NA 132*  K 3.8  CL 96*  CO2 28  BUN <5*  CREATININE 0.60  GLUCOSE 115*    Recent Labs Lab 11/23/16 0914  HGB 11.8*  HCT 35.9*  WBC 11.9*  PLT 608*   No results  found.  ASSESSMENT / PLAN:  H/o hodgkins lymphoma w/ Extensive lymphadenopathy and bx proven pulmonary involvement. Probable post-obstructive PNA vs atx  Discussion:  30 year old smoker with Diagnosis of Hodgkin's lymphoma with extensive airspace disease in the lung noted to be hypermetabolic on PET [id number] completed 6 cycles of AVD - Not given bleomycin due to lung compromise follow-up PET scan in 06/2016 shows some resolution of infiltrates she is now admitted with one week of shortness of breath but on questioning does report a cough dating back about a month Chest x-ray and CT chest shows extensive consolidation on the right without significant effusion. Highly suspicious for worsening pulmonary lymphoma  Plan Cont abx Bronchoscopy with TBBx performed , minimal secretions -await cultures, bx data  Post procedure CXR -no pneumothorax   PCCM to see Monday   Kara Mead MD. [address] [facility]. [place] [facility] Pager [phone] If no response call [phone]    11/26/2016, 7:50 AM

## 2016-11-26 NOTE — Progress Notes (Signed)
Progress Note    Heidi [name]  [id number] DOB: 1986/04/13  DOA: 11/23/2016 PCP: Maren Reamer, MD    Brief Narrative:   Chief complaint: Follow-up shortness of breath  Heidi [name] is an 30 y.o. female with Hodgkin's lymphoma with last chemotherapy approximately one month ago. Patient has not been able to go due to problems with transportation. Patient presented to [facility] with 2-3 days of shortness of breath and sputum production. Her CT angio chest showed no pulmonary embolism, stable mediastinal adenopathy consistent with h/o lymphoma. Multiple small opacities were noted throughout the left lung concerning for pneumonia with nearly complete opacification of right lung most likely representing pneumonia, although infiltrative disease by lymphoma cannot be excluded. [facility] to see if bronchoscopy indicated.   Assessment/Plan:   Principal problem: Acute respiratory failure with hypoxia (HCC) / Mediastinal adenopathy / Infiltrative disease by lymphoma / Lobar pneumonia / Leukocytosis  - CT angio chest showed no pulmonary embolism, stable mediastinal adenopathy consistent with h/o lymphoma. Multiple small opacities were noted throughout the left lung concerning for pneumonia with nearly complete opacification of right lung most likely representing pneumonia, although infiltrative disease by lymphoma cannot be excluded.  - Status post bronchoscopy with bronchial washings 11/26/16. Findings consistent with pneumonia. - Continue azithromycin and rocephin and follow-up respiratory cultures. At risk for atypical/resistant pathogens given history of chemotherapy.  - Continue bronchodilators as needed for shortness of breath or wheezing.  - Her respiratory status is stable at this time, no further fevers. Blood cx x 1 so far neg so would not broaden the abx coverage at this time. One blood cx with coagulase negative staph, likely a contaminant.  Active problems: Mixed  cellularity Hodgkin lymphoma of lymph nodes of multiple regions [facility]) - Follows with Dr. Irene Limbo, who saw her in consultation 11/23/16, recommended pulm consultation for possible bronch. - Patient has completedcycle 6day 1 AVD (bleomycin on hold due to lung compromise). - Her last chemotherapy was on 10/19/2016 and she missed her cycle 6 day 15 on 11/16/2016.  Thrombocytosis - Likely reactive. Refused further lab draws.  Hyponatremia - Appears to be prerenal in etiology. Refused further lab draws.    Anemia of chronic disease - Due to lymphoma. Refused further lab draws.  Family Communication/Anticipated D/C date and plan/Code Status   DVT prophylaxis: Lovenox ordered. Code Status: Full Code.  Family Communication: No family at the bedside. Disposition Plan: Home when BAL cultures result.   Medical Consultants:    Oncology  Pulmonology  Radiation Oncology   Procedures:    Bronchoscopy 11/26/16  Anti-Infectives:    Rocephin 11/23/16--->  Azithromycin 11/23/16--->  Subjective:   The patient reports that She would like to go home. She refused blood work this morning. Review of symptoms is positive for occasional cough with green sputum production and negative for sore throat, shortness of breath, nausea or vomiting.  Objective:    Vitals:   11/26/16 0815 11/26/16 0820 11/26/16 0900 11/26/16 1345  BP: (!) 176/95  130/82 126/76  Pulse:   (!) 125 (!) 121  Resp: (!) 23 (!) 22 20 20   Temp:   99.4 F (37.4 C) 99 F (37.2 C)  TempSrc:   Oral Oral  SpO2: 94% 95% 92% 95%  Weight:      Height:        Intake/Output Summary (Last 24 hours) at 11/26/16 1438 Last data filed at 11/26/16 1100  Gross per 24 hour  Intake  953 ml  Output              500 ml  Net              453 ml   Filed Weights   11/23/16 0844  Weight: 65.8 kg (145 lb)    Exam: General exam: Appears calm and comfortable.  Respiratory system: Vesicular breath sounds on the  right. Increased work of breathing. Cardiovascular system: S1 & S2 heard, regular, tachycardic. No JVD,  rubs, gallops or clicks. No murmurs. Gastrointestinal system: Abdomen is nondistended, soft and nontender. No organomegaly or masses felt. Normal bowel sounds heard. Central nervous system: Alert and oriented. No focal neurological deficits. Extremities: No clubbing,  or cyanosis. No edema. Skin: No rashes, lesions or ulcers. Psychiatry: Judgement and insight appear impaired. Mood & affect depressed/flat.   Data Reviewed:   I have personally reviewed following labs and imaging studies:  Labs: Basic Metabolic Panel:  Recent Labs Lab 11/23/16 0914  NA 132*  K 3.8  CL 96*  CO2 28  GLUCOSE 115*  BUN <5*  CREATININE 0.60  CALCIUM 7.8*   GFR Estimated Creatinine Clearance: 99.1 mL/min (by C-G formula based on SCr of 0.6 mg/dL). Liver Function Tests:  Recent Labs Lab 11/23/16 0914  AST 19  ALT 9*  ALKPHOS 59  BILITOT 0.6  PROT 5.0*  ALBUMIN 2.1*   No results for input(s): LIPASE, AMYLASE in the last 168 hours. No results for input(s): AMMONIA in the last 168 hours. Coagulation profile No results for input(s): INR, PROTIME in the last 168 hours.  CBC:  Recent Labs Lab 11/23/16 0914  WBC 11.9*  NEUTROABS 10.6*  HGB 11.8*  HCT 35.9*  MCV 92.8  PLT 608*   Cardiac Enzymes: No results for input(s): CKTOTAL, CKMB, CKMBINDEX, TROPONINI in the last 168 hours. BNP (last 3 results) No results for input(s): PROBNP in the last 8760 hours. CBG: No results for input(s): GLUCAP in the last 168 hours. D-Dimer: No results for input(s): DDIMER in the last 72 hours. Hgb A1c: No results for input(s): HGBA1C in the last 72 hours. Lipid Profile: No results for input(s): CHOL, HDL, LDLCALC, TRIG, CHOLHDL, LDLDIRECT in the last 72 hours. Thyroid function studies: No results for input(s): TSH, T4TOTAL, T3FREE, THYROIDAB in the last 72 hours.  Invalid input(s):  FREET3 Anemia work up: No results for input(s): VITAMINB12, FOLATE, FERRITIN, TIBC, IRON, RETICCTPCT in the last 72 hours. Sepsis Labs:  Recent Labs Lab 11/23/16 0914 11/23/16 0928 11/24/16 0422  PROCALCITON  --   --  0.18  WBC 11.9*  --   --   LATICACIDVEN  --  1.53  --     Microbiology Recent Results (from the past 240 hour(s))  Culture, blood (routine x 2) Call MD if unable to obtain prior to antibiotics being given     Status: Abnormal   Collection Time: 11/23/16  4:39 PM  Result Value Ref Range Status   Specimen Description BLOOD BLOOD LEFT FOREARM  Final   Special Requests BOTTLES DRAWN AEROBIC AND ANAEROBIC 5 CC EA  Final   Culture  Setup Time   Final    GRAM POSITIVE COCCI IN CLUSTERS ANAEROBIC BOTTLE ONLY CRITICAL RESULT CALLED TO, READ BACK BY AND VERIFIED WITH: Peetz 11/24/16 A BROWNING    Culture (A)  Final    STAPHYLOCOCCUS SPECIES (COAGULASE NEGATIVE) THE SIGNIFICANCE OF ISOLATING THIS ORGANISM FROM A SINGLE SET OF BLOOD CULTURES WHEN MULTIPLE SETS ARE DRAWN IS  UNCERTAIN. PLEASE NOTIFY THE MICROBIOLOGY DEPARTMENT WITHIN ONE WEEK IF SPECIATION AND SENSITIVITIES ARE REQUIRED. Performed at [facility]    Report Status 11/26/2016 FINAL  Final  Blood Culture ID Panel (Reflexed)     Status: None   Collection Time: 11/23/16  4:39 PM  Result Value Ref Range Status   Enterococcus species NOT DETECTED NOT DETECTED Final   Listeria monocytogenes NOT DETECTED NOT DETECTED Final   Staphylococcus species NOT DETECTED NOT DETECTED Final   Staphylococcus aureus NOT DETECTED NOT DETECTED Final   Streptococcus species NOT DETECTED NOT DETECTED Final   Streptococcus agalactiae NOT DETECTED NOT DETECTED Final   Streptococcus pneumoniae NOT DETECTED NOT DETECTED Final   Streptococcus pyogenes NOT DETECTED NOT DETECTED Final   Acinetobacter baumannii NOT DETECTED NOT DETECTED Final   Enterobacteriaceae species NOT DETECTED NOT DETECTED Final    Enterobacter cloacae complex NOT DETECTED NOT DETECTED Final   Escherichia coli NOT DETECTED NOT DETECTED Final   Klebsiella oxytoca NOT DETECTED NOT DETECTED Final   Klebsiella pneumoniae NOT DETECTED NOT DETECTED Final   Proteus species NOT DETECTED NOT DETECTED Final   Serratia marcescens NOT DETECTED NOT DETECTED Final   Haemophilus influenzae NOT DETECTED NOT DETECTED Final   Neisseria meningitidis NOT DETECTED NOT DETECTED Final   Pseudomonas aeruginosa NOT DETECTED NOT DETECTED Final   Candida albicans NOT DETECTED NOT DETECTED Final   Candida glabrata NOT DETECTED NOT DETECTED Final   Candida krusei NOT DETECTED NOT DETECTED Final   Candida parapsilosis NOT DETECTED NOT DETECTED Final   Candida tropicalis NOT DETECTED NOT DETECTED Final    Comment: Performed at [facility]  Culture, blood (routine x 2) Call MD if unable to obtain prior to antibiotics being given     Status: None (Preliminary result)   Collection Time: 11/23/16  5:02 PM  Result Value Ref Range Status   Specimen Description BLOOD LEFT ARM  Final   Special Requests BOTTLES DRAWN AEROBIC AND ANAEROBIC [place]  Final   Culture   Final    NO GROWTH 3 DAYS Performed at [facility]    Report Status PENDING  Incomplete  MRSA PCR Screening     Status: None   Collection Time: 11/23/16  5:36 PM  Result Value Ref Range Status   MRSA by PCR NEGATIVE NEGATIVE Final    Comment:        The GeneXpert MRSA Assay (FDA approved for NASAL specimens only), is one component of a comprehensive MRSA colonization surveillance program. It is not intended to diagnose MRSA infection nor to guide or monitor treatment for MRSA infections.     Radiology: Dg Chest Port 1 View  Result Date: 11/26/2016 CLINICAL DATA:  Status post bronchoscopy and right lower lobe biopsy. History of Hodgkin's lymphoma EXAM: PORTABLE CHEST 1 VIEW COMPARISON:  November 23, 2016 chest CT and chest radiograph FINDINGS: No pneumothorax.  Consolidation throughout most of the right lung remains. There is patchy infiltrate in the left upper lobe. No new opacity evident compared to recent studies. Heart size is within normal limits. Pulmonary vascularity is obscured on the right and grossly normal on the left. Port-A-Cath tip is in the superior vena cava, unchanged. Right paratracheal adenopathy is stable. IMPRESSION: No pneumothorax. Widespread consolidation on the right and patchy infiltrate left upper lobe remain stable. Stable cardiac silhouette. There is right paratracheal adenopathy. Stable Port-A-Cath position. Electronically Signed   By: Lowella Grip III M.D.   On: 11/26/2016 08:26    Medications:   .  azithromycin  500 mg Intravenous Q24H  . cefTRIAXone (ROCEPHIN)  IV  1 g Intravenous Q24H  . guaiFENesin  600 mg Oral BID  . heparin  5,000 Units Subcutaneous Q8H  . sodium chloride flush  3 mL Intravenous Q12H   Continuous Infusions: . sodium chloride 10 mL/hr at 11/26/16 [id number]    Medical decision making is of high complexity and this patient is at high risk of deterioration, therefore this is a level 3 visit.    LOS: 3 days   [facility] Pager [phone]. If unable to reach me by pager, please call my cell phone at [phone].  *Please refer to [email or web address], password [email or web address] to get updated schedule on who will round on this patient, as hospitalists switch teams weekly. If 7PM-7AM, please contact night-coverage at [email or web address], password [email or web address] for any overnight needs.  11/26/2016, 2:38 PM

## 2016-11-27 DIAGNOSIS — D638 Anemia in other chronic diseases classified elsewhere: Secondary | ICD-10-CM

## 2016-11-27 DIAGNOSIS — E871 Hypo-osmolality and hyponatremia: Secondary | ICD-10-CM

## 2016-11-27 LAB — ACID FAST SMEAR (AFB): ACID FAST SMEAR - AFSCU2: NEGATIVE

## 2016-11-27 MED ORDER — AZITHROMYCIN 250 MG PO TABS
500.0000 mg | ORAL_TABLET | Freq: Every day | ORAL | Status: DC
  Administered 2016-11-27: 500 mg via ORAL
  Filled 2016-11-27: qty 2

## 2016-11-27 NOTE — Progress Notes (Signed)
Patient ID: Rollen Sox, female   DOB: [birth year], 30 y.o.   MRN: [id number]  PROGRESS NOTE    CAROLINDA LU  [id number] DOB: 18-Jan-1986 DOA: 11/23/2016  PCP: Maren Reamer, MD   Brief Narrative:  30 y.o. female with Hodgkin's lymphoma with last chemotherapy approximately one month ago. Patient has not been able to go due to problems with transportation. Patient presented to [facility] with 2-3 days of shortness of breath and sputum production. Her CT angio chest showed no pulmonary embolism, stable mediastinal adenopathy consistent with h/o lymphoma. Multiple small opacities werenoted throughout the left lung concerning for pneumonia with nearly complete opacification of right lung most likely representing pneumonia, although infiltrative disease by lymphoma cannot be excluded. Pt is s/p bronchoscopy 12/1, results pending.    Assessment & Plan:  Principal problem: Acute respiratory failure with hypoxia (HCC) / Mediastinal adenopathy / Infiltrative disease by lymphoma / Lobar pneumonia / Leukocytosis  - CT angio chest showed no pulmonary embolism, stable mediastinal adenopathy consistent with h/o lymphoma. Multiple small opacities werenoted throughout the left lung concerning for pneumonia with nearly complete opacification of right lung most likely representing pneumonia, although infiltrative disease by lymphoma cannot be excluded.  - Pt is status post bronchoscopy with bronchial washings 11/26/16. Findings consistent with pneumonia. - Continue azithromycin and rocephin and follow-up respiratory cultures. At risk for atypical/resistant pathogens given history of chemotherapy.  - Continue BD as needed for shortness of breath or wheezing.  - Her respiratory status is stable at this time, no further fevers. Blood cx x 1 so far neg so would not broaden the abx coverage at this time. One blood cx with coagulase negative staph, likely a contaminant.  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 Irene Limbo, who saw her in consultation 11/23/16, recommended pulm consultation for possible bronch. - Patient has completedcycle 6day 1 AVD (bleomycin on hold due to lung compromise). - Her last chemotherapy was on 10/19/2016 and she missed her cycle 6 day 15 on 11/16/2016.  Thrombocytosis - Likely reactive. Refused further lab draws.  Hyponatremia - Appears to be prerenal in etiology. Refused further lab draws.  Anemia of chronic disease - Due to lymphoma. Refused further lab draws.    DVT prophylaxis: Lovenox ordered. Code Status: Full Code.  Family Communication: No family at the bedside. Disposition Plan: Home when BAL cultures result. PCCM to see her on Monday    Medical Consultants:  Oncology  Pulmonology  Radiation Oncology  Procedures:  Bronchoscopy 11/26/16   Anti-Infectives:  Rocephin 11/23/16--->  Azithromycin 11/23/16--->    Subjective: No overnight events. Pt wants to go home.   Objective: Vitals:   11/26/16 1751 11/26/16 2134 11/27/16 0143 11/27/16 0517  BP: 127/72 (!) 143/85 (!) 155/87 (!) 153/77  Pulse: (!) 108 (!) 114 (!) 110 (!) 111  Resp: 18 18 16 18   Temp: 98.2 F (36.8 C) 98.2 F (36.8 C) 99.9 F (37.7 C) 99.7 F (37.6 C)  TempSrc: Oral Oral Oral Oral  SpO2: 98% 91% 97% 92%  Weight:    65.9 kg (145 lb 3.2 oz)  Height:        Intake/Output Summary (Last 24 hours) at 11/27/16 1300 Last data filed at 11/27/16 0217  Gross per 24 hour  Intake              880 ml  Output  0 ml  Net              880 ml   Filed Weights   11/23/16 0844 11/27/16 0517  Weight: 65.8 kg (145 lb) 65.9 kg (145 lb 3.2 oz)    Examination:  General exam: Appears calm and comfortable  Respiratory system: Respiratory effort normal. No wheezing  Cardiovascular system: S1 & S2 heard, RRR. No JVD, murmurs, rubs, gallops or clicks. No pedal  edema. Gastrointestinal system: Abdomen is nondistended, soft and nontender. No organomegaly or masses felt. Normal bowel sounds heard. Central nervous system: Alert and oriented. No focal neurological deficits. Extremities: Symmetric 5 x 5 power. Skin: No rashes, lesions or ulcers Psychiatry: Judgement and insight appear normal. Mood & affect appropriate.   Data Reviewed: I have personally reviewed following labs and imaging studies  CBC:  Recent Labs Lab 11/23/16 0914  WBC 11.9*  NEUTROABS 10.6*  HGB 11.8*  HCT 35.9*  MCV 92.8  PLT [id number]*   Basic Metabolic Panel:  Recent Labs Lab 11/23/16 0914  NA 132*  K 3.8  CL 96*  CO2 28  GLUCOSE 115*  BUN <5*  CREATININE 0.60  CALCIUM 7.8*   GFR: Estimated Creatinine Clearance: 99.1 mL/min (by C-G formula based on SCr of 0.6 mg/dL). Liver Function Tests:  Recent Labs Lab 11/23/16 0914  AST 19  ALT 9*  ALKPHOS 59  BILITOT 0.6  PROT 5.0*  ALBUMIN 2.1*   No results for input(s): LIPASE, AMYLASE in the last 168 hours. No results for input(s): AMMONIA in the last 168 hours. Coagulation Profile: No results for input(s): INR, PROTIME in the last 168 hours. Cardiac Enzymes: No results for input(s): CKTOTAL, CKMB, CKMBINDEX, TROPONINI in the last 168 hours. BNP (last 3 results) No results for input(s): PROBNP in the last 8760 hours. HbA1C: No results for input(s): HGBA1C in the last 72 hours. CBG: No results for input(s): GLUCAP in the last 168 hours. Lipid Profile: No results for input(s): CHOL, HDL, LDLCALC, TRIG, CHOLHDL, LDLDIRECT in the last 72 hours. Thyroid Function Tests: No results for input(s): TSH, T4TOTAL, FREET4, T3FREE, THYROIDAB in the last 72 hours. Anemia Panel: No results for input(s): VITAMINB12, FOLATE, FERRITIN, TIBC, IRON, RETICCTPCT in the last 72 hours. Urine analysis:    Component Value Date/Time   COLORURINE AMBER BIOCHEMICALS MAY BE AFFECTED BY COLOR (A) 08/01/2009 1305   APPEARANCEUR CLEAR  08/01/2009 1305   LABSPEC 1.029 08/01/2009 1305   PHURINE 6.5 08/01/2009 1305   GLUCOSEU NEGATIVE 08/01/2009 1305   HGBUR NEGATIVE 08/01/2009 1305   BILIRUBINUR SMALL (A) 08/01/2009 1305   KETONESUR 15 (A) 08/01/2009 1305   PROTEINUR NEGATIVE 08/01/2009 1305   UROBILINOGEN 1.0 08/01/2009 1305   NITRITE NEGATIVE 08/01/2009 1305   LEUKOCYTESUR SMALL (A) 08/01/2009 1305   Sepsis Labs: @LABRCNTIP (procalcitonin:4,lacticidven:4)    Recent Results (from the past 240 hour(s))  Culture, blood (routine x 2) Call MD if unable to obtain prior to antibiotics being given     Status: Abnormal   Collection Time: 11/23/16  4:39 PM  Result Value Ref Range Status   Specimen Description BLOOD BLOOD LEFT FOREARM  Final   Special Requests BOTTLES DRAWN AEROBIC AND ANAEROBIC 5 CC EA  Final   Culture  Setup Time   Final    GRAM POSITIVE COCCI IN CLUSTERS ANAEROBIC BOTTLE ONLY CRITICAL RESULT CALLED TO, READ BACK BY AND VERIFIED WITH: [place] 11/24/16 A BROWNING    Culture (A)  Final    STAPHYLOCOCCUS SPECIES (  COAGULASE NEGATIVE) THE SIGNIFICANCE OF ISOLATING THIS ORGANISM FROM A SINGLE SET OF BLOOD CULTURES WHEN MULTIPLE SETS ARE DRAWN IS UNCERTAIN. PLEASE NOTIFY THE MICROBIOLOGY DEPARTMENT WITHIN ONE WEEK IF SPECIATION AND SENSITIVITIES ARE REQUIRED. Performed at [facility]    Report Status 11/26/2016 FINAL  Final  Blood Culture ID Panel (Reflexed)     Status: None   Collection Time: 11/23/16  4:39 PM  Result Value Ref Range Status   Enterococcus species NOT DETECTED NOT DETECTED Final   Listeria monocytogenes NOT DETECTED NOT DETECTED Final   Staphylococcus species NOT DETECTED NOT DETECTED Final   Staphylococcus aureus NOT DETECTED NOT DETECTED Final   Streptococcus species NOT DETECTED NOT DETECTED Final   Streptococcus agalactiae NOT DETECTED NOT DETECTED Final   Streptococcus pneumoniae NOT DETECTED NOT DETECTED Final   Streptococcus pyogenes NOT DETECTED NOT  DETECTED Final   Acinetobacter baumannii NOT DETECTED NOT DETECTED Final   Enterobacteriaceae species NOT DETECTED NOT DETECTED Final   Enterobacter cloacae complex NOT DETECTED NOT DETECTED Final   Escherichia coli NOT DETECTED NOT DETECTED Final   Klebsiella oxytoca NOT DETECTED NOT DETECTED Final   Klebsiella pneumoniae NOT DETECTED NOT DETECTED Final   Proteus species NOT DETECTED NOT DETECTED Final   Serratia marcescens NOT DETECTED NOT DETECTED Final   Haemophilus influenzae NOT DETECTED NOT DETECTED Final   Neisseria meningitidis NOT DETECTED NOT DETECTED Final   Pseudomonas aeruginosa NOT DETECTED NOT DETECTED Final   Candida albicans NOT DETECTED NOT DETECTED Final   Candida glabrata NOT DETECTED NOT DETECTED Final   Candida krusei NOT DETECTED NOT DETECTED Final   Candida parapsilosis NOT DETECTED NOT DETECTED Final   Candida tropicalis NOT DETECTED NOT DETECTED Final    Comment: Performed at [facility]  Culture, blood (routine x 2) Call MD if unable to obtain prior to antibiotics being given     Status: None (Preliminary result)   Collection Time: 11/23/16  5:02 PM  Result Value Ref Range Status   Specimen Description BLOOD LEFT ARM  Final   Special Requests BOTTLES DRAWN AEROBIC AND ANAEROBIC [place]  Final   Culture   Final    NO GROWTH 3 DAYS Performed at [facility]    Report Status PENDING  Incomplete  MRSA PCR Screening     Status: None   Collection Time: 11/23/16  5:36 PM  Result Value Ref Range Status   MRSA by PCR NEGATIVE NEGATIVE Final    Comment:        The GeneXpert MRSA Assay (FDA approved for NASAL specimens only), is one component of a comprehensive MRSA colonization surveillance program. It is not intended to diagnose MRSA infection nor to guide or monitor treatment for MRSA infections.   Culture, bal-quantitative     Status: None (Preliminary result)   Collection Time: 11/26/16  7:30 AM  Result Value Ref Range Status   Specimen  Description BRONCHIAL ALVEOLAR LAVAGE  Final   Special Requests Immunocompromised  Final   Gram Stain   Final    RARE WBC PRESENT, PREDOMINANTLY MONONUCLEAR NO ORGANISMS SEEN    Culture   Final    NO GROWTH < 24 HOURS Performed at [facility]    Report Status PENDING  Incomplete      Radiology Studies: Dg Chest Port 1 View  Result Date: 11/26/2016 CLINICAL DATA:  Status post bronchoscopy and right lower lobe biopsy. History of Hodgkin's lymphoma EXAM: PORTABLE CHEST 1 VIEW COMPARISON:  November 23, 2016 chest  CT and chest radiograph FINDINGS: No pneumothorax. Consolidation throughout most of the right lung remains. There is patchy infiltrate in the left upper lobe. No new opacity evident compared to recent studies. Heart size is within normal limits. Pulmonary vascularity is obscured on the right and grossly normal on the left. Port-A-Cath tip is in the superior vena cava, unchanged. Right paratracheal adenopathy is stable. IMPRESSION: No pneumothorax. Widespread consolidation on the right and patchy infiltrate left upper lobe remain stable. Stable cardiac silhouette. There is right paratracheal adenopathy. Stable Port-A-Cath position. Electronically Signed   By: Lowella Grip III M.D.   On: 11/26/2016 08:26     Scheduled Meds: . azithromycin  500 mg Intravenous Q24H  . cefTRIAXone (ROCEPHIN)  IV  1 g Intravenous Q24H  . enoxaparin (LOVENOX) injection  40 mg Subcutaneous Q24H  . guaiFENesin  600 mg Oral BID  . sodium chloride flush  3 mL Intravenous Q12H   Continuous Infusions: . sodium chloride 10 mL/hr at 11/26/16 0648     LOS: 4 days    Time spent: 15 minutes  Greater than 50% of the time spent on counseling and coordinating the care.   [name], MD [facility] Pager [phone]  If 7PM-7AM, please contact night-coverage [email or web address] Password [email or web address] 11/27/2016, 1:00 PM

## 2016-11-28 LAB — CULTURE, BAL-QUANTITATIVE W GRAM STAIN: Culture: NORMAL

## 2016-11-28 LAB — CULTURE, BLOOD (ROUTINE X 2): Culture: NO GROWTH

## 2016-11-28 LAB — CULTURE, BAL-QUANTITATIVE

## 2016-11-29 ENCOUNTER — Encounter (HOSPITAL_COMMUNITY): Payer: Self-pay | Admitting: Pulmonary Disease

## 2016-12-01 ENCOUNTER — Telehealth: Payer: Self-pay | Admitting: Hematology

## 2016-12-01 NOTE — Telephone Encounter (Signed)
lvm to inform pt of 12/13 appt date/time per LOS

## 2016-12-04 NOTE — Discharge Summary (Signed)
Physician Discharge Summary  Heidi [name] [id number] DOB: [birth year] DOA: 11/23/2016  PCP: Maren Reamer, MD   Admit date: 11/23/2016 Discharge date: 12/04/2016  Recommendations for Outpatient Follow-up:  1. Pt left against medical advice  2. I spoke with pulm Dr. Chuck Hint, because it was a weekend there was no way for him to schedule follow up appt for pt for results of biopsy. I let pt know of that but she insisted on going home. I called pharmacy for prescription for Levaquin on discharge.  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 Patient has not been able to go due to problems with transportation. Patient presented to [facility] with 2-3 days of shortness of breath and sputum production. Her CT angio chest showed no pulmonary embolism, stable mediastinal adenopathy consistent with h/o lymphoma. Multiple small opacities werenoted throughout the left lung concerning for pneumonia with nearly complete opacification of right lung most likely representing pneumonia, although infiltrative disease by lymphoma cannot be excluded. Pt is s/p bronchoscopy 12/1, results pending.     Hospital Course:   Assessment & Plan:  Principal problem: Acute respiratory failure with hypoxia (HCC) / Mediastinal adenopathy / Infiltrative disease by lymphoma / Lobar pneumonia / Leukocytosis  - CT angio chest showed no pulmonary embolism, stable mediastinal adenopathy consistent with h/o lymphoma. Multiple small opacities werenoted  throughout the left lung concerning for pneumonia with nearly complete opacification of right lung most likely representing pneumonia, although infiltrative disease by lymphoma cannot be excluded.  - Pt is status post bronchoscopy with bronchial washings 11/26/16. Findings consistent with pneumonia. - Continuedazithromycin and rocephinand follow-up respiratory cultures. At risk for atypical/resistant pathogens given history of chemotherapy.Since she is leaving AMA we prescribed Levaquin on discharge. - Continue BD as needed for shortness of breath or wheezing. - Her respiratorystatus is stable at this time, no further fevers. Blood cx x 1 so far neg so would not broaden the abx coverage at this time. One blood cx with coagulase negative staph, likely a contaminant.  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 Irene Limbo, who saw her in consultation 11/23/16, recommended pulm consultation for possible bronch. - Patient has completedcycle 6day 1 AVD (bleomycin on hold due to lung compromise). - Her last chemotherapy was on 10/19/2016 and she missed her cycle 6 day 15 on 11/16/2016.  Thrombocytosis - Likely reactive. Refused further lab draws.  Hyponatremia - Appears to be prerenal in etiology. Refused further lab draws.  Anemia of chronic disease - Due to lymphoma. Refused further lab draws.    DVT prophylaxis:Lovenox ordered. Code Status:Full Code.  Family Communication:No family at the bedside.    Medical Consultants:  Oncology  Pulmonology  Radiation Oncology  Procedures:  Bronchoscopy 11/26/16   Anti-Infectives:  Rocephin 11/23/16---> 12/2  Azithromycin 11/23/16---> 12/2    Signed:  [name], MD  [facility] 12/04/2016, 1:12 PM  Pager #: [phone]     Discharge Exam: Vitals:   11/27/16 0517 11/27/16 1356  BP: (!) 153/77 131/79  Pulse: (!) 111 (!)  108  Resp: 18 18  Temp: 99.7 F (37.6 C) 98.6 F (37 C)   Vitals:   11/26/16 2134 11/27/16 0143 11/27/16 0517 11/27/16 1356  BP: (!) 143/85 (!) 155/87 (!) 153/77 131/79  Pulse: (!) 114 (!) 110 (!) 111 (!) 108  Resp: 18 16 18 18   Temp: 98.2 F (36.8 C) 99.9 F (37.7 C) 99.7 F (37.6 C) 98.6 F (37 C)  TempSrc: Oral Oral Oral Oral  SpO2: 91% 97% 92% 97%  Weight:   65.9 kg (145 lb 3.2 oz)   Height:        Pt left AMA so PE done in am but not prior to pt leaving AMA  Discharge Instructions     Medication List    ASK your doctor about these medications   dexamethasone 4 MG tablet Commonly known as:  DECADRON 2 tab (8mg ) twice a day for 3 days starting on day of chemotherapy   guaiFENesin 600 MG 12 hr tablet Commonly known as:  MUCINEX Take 1,200 mg by mouth 2 (two) times daily as needed for cough or to loosen phlegm.   levalbuterol 45 MCG/ACT inhaler Commonly known as:  XOPENEX HFA Inhale 2 puffs into the lungs every 6 (six) hours as needed for wheezing.   lidocaine-prilocaine cream Commonly known as:  EMLA Apply 1 application topically as needed (numbing).   loratadine 10 MG tablet Commonly known as:  CLARITIN Take 1 tablet (10 mg total) by mouth daily. For up to 5 days following Neulasta injection.   metoprolol 50 MG tablet Commonly known as:  LOPRESSOR Take 1 tablet (50 mg total) by mouth 2 (two) times daily.   ondansetron 8 MG tablet Commonly known as:  ZOFRAN Take 1 tablet (8 mg total) by mouth every 8 (eight) hours as needed for nausea (start on the 3rd day after chemotherapy).   prochlorperazine 10 MG tablet Commonly known as:  COMPAZINE Take 1 tablet (10 mg total) by mouth every 6 (six) hours as needed for nausea or vomiting.         The results of significant diagnostics from this hospitalization (including imaging, microbiology, ancillary and laboratory) are listed below for reference.    Significant Diagnostic Studies: Dg Chest 2  View  Result Date: 11/23/2016 CLINICAL DATA:  Worsening coughing congestion over the past week. History of Hodgkin's lymphoma. EXAM: CHEST  2 VIEW COMPARISON:  PET-CT 07/22/2016.  Chest radiographs 04/21/2016. FINDINGS: Right jugular Port-A-Cath terminates in the region of the bright brachiocephalic vein as on the prior PET-CT. The cardiac silhouette again appears mildly enlarged, though the right heart border is obscured. There is mild patchy opacity in the left mid lung which has greatly improved from the 03/2016 chest radiographs and is similar to the more recent PET-CT. There is extensive consolidation throughout much of the right lung which is similar to the prior radiographs but substantially worsened compared to the more recent PET-CT. An underlying right pleural effusion is not excluded. No pneumothorax. No acute osseous abnormality. IMPRESSION: Extensive right lung consolidation, increased from 06/2016. Electronically Signed   By: Logan Bores M.D.   On: 11/23/2016 09:36   Ct Angio Chest Pe W/cm &/or Wo Cm  Result Date: 11/23/2016 CLINICAL DATA:  Cough.  History of Hodgkin's lymphoma. EXAM: CT ANGIOGRAPHY CHEST WITH CONTRAST TECHNIQUE: Multidetector CT imaging of the chest was performed using the standard protocol during bolus administration of intravenous contrast. Multiplanar CT image reconstructions and MIPs were obtained to evaluate  the vascular anatomy. CONTRAST:  100 mL of Isovue 370 intravenously. PET scan of July 22, 2016. COMPARISON:  CT scan of April 10, 2016. FINDINGS: Cardiovascular: There is no evidence of thoracic aortic dissection or aneurysm. There is no definite evidence of pulmonary embolus. Mediastinum/Nodes: Mediastinal adenopathy is noted consistent with a history of lymphoma. Right internal jugular Port-A-Cath is noted which is looped within right internal jugular vein more superiorly, in distal tip is located in right brachiocephalic vein. Lungs/Pleura: Multifocal opacities are  noted in the left lung which may represent pneumonia. There is nearly complete opacification of the right lung which is significantly worse compared to prior PET scan, most consistent with pneumonia. No pneumothorax is noted. Upper Abdomen: No definite abnormality seen in visualized portion of upper abdomen. Musculoskeletal: No significant osseous abnormality is noted. Review of the MIP images confirms the above findings. IMPRESSION: No definite evidence of pulmonary embolus. Stable mediastinal adenopathy consistent with history of lymphoma. Multiple small opacities are noted throughout the left lung concerning for pneumonia. There is nearly complete opacification of right lung most likely representing pneumonia, although infiltrative disease by lymphoma cannot be excluded. Electronically Signed   By: Marijo Conception, M.D.   On: 11/23/2016 11:40   Dg Chest Port 1 View  Result Date: 11/26/2016 CLINICAL DATA:  Status post bronchoscopy and right lower lobe biopsy. History of Hodgkin's lymphoma EXAM: PORTABLE CHEST 1 VIEW COMPARISON:  November 23, 2016 chest CT and chest radiograph FINDINGS: No pneumothorax. Consolidation throughout most of the right lung remains. There is patchy infiltrate in the left upper lobe. No new opacity evident compared to recent studies. Heart size is within normal limits. Pulmonary vascularity is obscured on the right and grossly normal on the left. Port-A-Cath tip is in the superior vena cava, unchanged. Right paratracheal adenopathy is stable. IMPRESSION: No pneumothorax. Widespread consolidation on the right and patchy infiltrate left upper lobe remain stable. Stable cardiac silhouette. There is right paratracheal adenopathy. Stable Port-A-Cath position. Electronically Signed   By: Lowella Grip III M.D.   On: 11/26/2016 08:26    Microbiology: Recent Results (from the past 240 hour(s))  Culture, bal-quantitative     Status: None   Collection Time: 11/26/16  7:30 AM  Result  Value Ref Range Status   Specimen Description BRONCHIAL ALVEOLAR LAVAGE  Final   Special Requests Immunocompromised  Final   Gram Stain   Final    RARE WBC PRESENT, PREDOMINANTLY MONONUCLEAR NO ORGANISMS SEEN    Culture   Final    Consistent with normal respiratory flora. Performed at [facility]    Report Status 11/28/2016 FINAL  Final  Acid Fast Smear (AFB)     Status: None   Collection Time: 11/26/16  7:30 AM  Result Value Ref Range Status   AFB Specimen Processing Concentration  Final   Acid Fast Smear Negative  Final    Comment: (NOTE) Performed At: [facility] [address] [place], [place] [id number] Lindon Romp MD [id number]    Source (AFB) BRONCHIAL ALVEOLAR LAVAGE  Final  Fungus Culture With Stain     Status: None (Preliminary result)   Collection Time: 11/26/16  7:30 AM  Result Value Ref Range Status   Fungus Stain Final report  Final    Comment: (NOTE) Performed At: [facility] [place], [place] [id number] Lindon Romp MD [id number]    Fungus (Mycology) Culture PENDING  Incomplete   Fungal Source BRONCHIAL ALVEOLAR LAVAGE  Final  Fungus Culture Result     Status: None   Collection Time: 11/26/16  7:30 AM  Result Value Ref Range Status   Result 1 Comment  Final    Comment: (NOTE) KOH/Calcofluor preparation:  no fungus observed. Performed At: [facility] [place], [place] [id number] Lindon Romp MD [id number]      Labs: Basic Metabolic Panel: No results for input(s): NA, K, CL, CO2, GLUCOSE, BUN, CREATININE, CALCIUM, MG, PHOS in the last 168 hours. Liver Function Tests: No results for input(s): AST, ALT, ALKPHOS, BILITOT, PROT, ALBUMIN in the last 168 hours. No results for input(s): LIPASE, AMYLASE in the last 168 hours. No results for input(s): AMMONIA in the last 168 hours. CBC: No results for input(s): WBC, NEUTROABS, HGB, HCT, MCV, PLT in the last 168  hours. Cardiac Enzymes: No results for input(s): CKTOTAL, CKMB, CKMBINDEX, TROPONINI in the last 168 hours. BNP: BNP (last 3 results) No results for input(s): BNP in the last 8760 hours.  ProBNP (last 3 results) No results for input(s): PROBNP in the last 8760 hours.  CBG: No results for input(s): GLUCAP in the last 168 hours.

## 2016-12-08 ENCOUNTER — Ambulatory Visit: Payer: Self-pay | Admitting: Hematology

## 2016-12-13 ENCOUNTER — Ambulatory Visit: Payer: Self-pay | Admitting: Hematology

## 2016-12-14 ENCOUNTER — Inpatient Hospital Stay (HOSPITAL_COMMUNITY)
Admission: EM | Admit: 2016-12-14 | Discharge: 2016-12-23 | DRG: 270 | Disposition: A | Discharge status: Home / Self Care | Payer: Medicaid Other | Attending: Internal Medicine | Admitting: Internal Medicine

## 2016-12-14 ENCOUNTER — Emergency Department (HOSPITAL_COMMUNITY): Payer: Medicaid Other

## 2016-12-14 ENCOUNTER — Encounter (HOSPITAL_COMMUNITY): Payer: Self-pay

## 2016-12-14 DIAGNOSIS — D72829 Elevated white blood cell count, unspecified: Secondary | ICD-10-CM | POA: Diagnosis present

## 2016-12-14 DIAGNOSIS — Y838 Other surgical procedures as the cause of abnormal reaction of the patient, or of later complication, without mention of misadventure at the time of the procedure: Secondary | ICD-10-CM | POA: Diagnosis present

## 2016-12-14 DIAGNOSIS — J9601 Acute respiratory failure with hypoxia: Secondary | ICD-10-CM | POA: Diagnosis present

## 2016-12-14 DIAGNOSIS — T424X5A Adverse effect of benzodiazepines, initial encounter: Secondary | ICD-10-CM | POA: Diagnosis not present

## 2016-12-14 DIAGNOSIS — I871 Compression of vein: Secondary | ICD-10-CM | POA: Diagnosis present

## 2016-12-14 DIAGNOSIS — I82621 Acute embolism and thrombosis of deep veins of right upper extremity: Secondary | ICD-10-CM | POA: Diagnosis not present

## 2016-12-14 DIAGNOSIS — T829XXA Unspecified complication of cardiac and vascular prosthetic device, implant and graft, initial encounter: Secondary | ICD-10-CM

## 2016-12-14 DIAGNOSIS — J9819 Other pulmonary collapse: Secondary | ICD-10-CM | POA: Diagnosis present

## 2016-12-14 DIAGNOSIS — D649 Anemia, unspecified: Secondary | ICD-10-CM | POA: Diagnosis present

## 2016-12-14 DIAGNOSIS — Y95 Nosocomial condition: Secondary | ICD-10-CM | POA: Diagnosis present

## 2016-12-14 DIAGNOSIS — Z79899 Other long term (current) drug therapy: Secondary | ICD-10-CM | POA: Diagnosis not present

## 2016-12-14 DIAGNOSIS — R0600 Dyspnea, unspecified: Secondary | ICD-10-CM | POA: Diagnosis present

## 2016-12-14 DIAGNOSIS — I10 Essential (primary) hypertension: Secondary | ICD-10-CM | POA: Diagnosis present

## 2016-12-14 DIAGNOSIS — R0602 Shortness of breath: Secondary | ICD-10-CM

## 2016-12-14 DIAGNOSIS — F17200 Nicotine dependence, unspecified, uncomplicated: Secondary | ICD-10-CM | POA: Diagnosis present

## 2016-12-14 DIAGNOSIS — J189 Pneumonia, unspecified organism: Secondary | ICD-10-CM | POA: Diagnosis present

## 2016-12-14 DIAGNOSIS — I82629 Acute embolism and thrombosis of deep veins of unspecified upper extremity: Secondary | ICD-10-CM

## 2016-12-14 DIAGNOSIS — A419 Sepsis, unspecified organism: Secondary | ICD-10-CM | POA: Diagnosis present

## 2016-12-14 DIAGNOSIS — J45909 Unspecified asthma, uncomplicated: Secondary | ICD-10-CM | POA: Diagnosis present

## 2016-12-14 DIAGNOSIS — G92 Toxic encephalopathy: Secondary | ICD-10-CM | POA: Diagnosis not present

## 2016-12-14 DIAGNOSIS — I82C11 Acute embolism and thrombosis of right internal jugular vein: Secondary | ICD-10-CM | POA: Diagnosis present

## 2016-12-14 DIAGNOSIS — E8809 Other disorders of plasma-protein metabolism, not elsewhere classified: Secondary | ICD-10-CM | POA: Diagnosis present

## 2016-12-14 DIAGNOSIS — Z9221 Personal history of antineoplastic chemotherapy: Secondary | ICD-10-CM | POA: Diagnosis not present

## 2016-12-14 DIAGNOSIS — T82524A Displacement of infusion catheter, initial encounter: Principal | ICD-10-CM | POA: Diagnosis present

## 2016-12-14 DIAGNOSIS — Z720 Tobacco use: Secondary | ICD-10-CM | POA: Diagnosis not present

## 2016-12-14 DIAGNOSIS — Z9119 Patient's noncompliance with other medical treatment and regimen: Secondary | ICD-10-CM | POA: Diagnosis not present

## 2016-12-14 DIAGNOSIS — Z95828 Presence of other vascular implants and grafts: Secondary | ICD-10-CM

## 2016-12-14 DIAGNOSIS — R7989 Other specified abnormal findings of blood chemistry: Secondary | ICD-10-CM | POA: Diagnosis present

## 2016-12-14 DIAGNOSIS — R918 Other nonspecific abnormal finding of lung field: Secondary | ICD-10-CM | POA: Diagnosis not present

## 2016-12-14 DIAGNOSIS — M7989 Other specified soft tissue disorders: Secondary | ICD-10-CM | POA: Diagnosis not present

## 2016-12-14 DIAGNOSIS — R609 Edema, unspecified: Secondary | ICD-10-CM | POA: Diagnosis present

## 2016-12-14 DIAGNOSIS — I160 Hypertensive urgency: Secondary | ICD-10-CM | POA: Diagnosis present

## 2016-12-14 DIAGNOSIS — C8128 Mixed cellularity classical Hodgkin lymphoma, lymph nodes of multiple sites: Secondary | ICD-10-CM | POA: Diagnosis present

## 2016-12-14 LAB — COMPREHENSIVE METABOLIC PANEL
ALT: 12 U/L — ABNORMAL LOW (ref 14–54)
AST: 18 U/L (ref 15–41)
Albumin: 1.8 g/dL — ABNORMAL LOW (ref 3.5–5.0)
Alkaline Phosphatase: 116 U/L (ref 38–126)
Anion gap: 7 (ref 5–15)
BUN: 7 mg/dL (ref 6–20)
CHLORIDE: 95 mmol/L — AB (ref 101–111)
CO2: 34 mmol/L — ABNORMAL HIGH (ref 22–32)
Calcium: 7.8 mg/dL — ABNORMAL LOW (ref 8.9–10.3)
Creatinine, Ser: 0.5 mg/dL (ref 0.44–1.00)
Glucose, Bld: 117 mg/dL — ABNORMAL HIGH (ref 65–99)
POTASSIUM: 4.1 mmol/L (ref 3.5–5.1)
Sodium: 136 mmol/L (ref 135–145)
Total Bilirubin: 0.7 mg/dL (ref 0.3–1.2)
Total Protein: 4.6 g/dL — ABNORMAL LOW (ref 6.5–8.1)

## 2016-12-14 LAB — URINALYSIS, ROUTINE W REFLEX MICROSCOPIC
Glucose, UA: NEGATIVE mg/dL
HGB URINE DIPSTICK: NEGATIVE
Ketones, ur: NEGATIVE mg/dL
NITRITE: NEGATIVE
PH: 5 (ref 5.0–8.0)
Protein, ur: 30 mg/dL — AB
SPECIFIC GRAVITY, URINE: 1.03 (ref 1.005–1.030)

## 2016-12-14 LAB — CBC
HCT: 35.8 % — ABNORMAL LOW (ref 36.0–46.0)
Hemoglobin: 11.5 g/dL — ABNORMAL LOW (ref 12.0–15.0)
MCH: 29.6 pg (ref 26.0–34.0)
MCHC: 32.1 g/dL (ref 30.0–36.0)
MCV: 92 fL (ref 78.0–100.0)
PLATELETS: 879 10*3/uL — AB (ref 150–400)
RBC: 3.89 MIL/uL (ref 3.87–5.11)
RDW: 15 % (ref 11.5–15.5)
WBC: 13.5 10*3/uL — AB (ref 4.0–10.5)

## 2016-12-14 LAB — BRAIN NATRIURETIC PEPTIDE: B NATRIURETIC PEPTIDE 5: 28 pg/mL (ref 0.0–100.0)

## 2016-12-14 LAB — LACTIC ACID, PLASMA: Lactic Acid, Venous: 2.2 mmol/L (ref 0.5–1.9)

## 2016-12-14 MED ORDER — SODIUM CHLORIDE 0.9 % IV SOLN: 250.0000 mL | INTRAVENOUS | Status: DC | PRN

## 2016-12-14 MED ORDER — ALBUMIN HUMAN 25 % IV SOLN
25.0000 g | Freq: Once | INTRAVENOUS | Status: AC
  Administered 2016-12-14: 25 g via INTRAVENOUS
  Filled 2016-12-14: qty 100

## 2016-12-14 MED ORDER — SODIUM CHLORIDE 0.9% FLUSH: 3.0000 mL | INTRAVENOUS | Status: DC | PRN

## 2016-12-14 MED ORDER — MORPHINE SULFATE (PF) 2 MG/ML IV SOLN: 2.0000 mg | INTRAVENOUS | Status: DC | PRN

## 2016-12-14 MED ORDER — ENOXAPARIN SODIUM 40 MG/0.4ML ~~LOC~~ SOLN
40.0000 mg | Freq: Every day | SUBCUTANEOUS | Status: DC
  Administered 2016-12-15: 40 mg via SUBCUTANEOUS
  Filled 2016-12-14: qty 0.4

## 2016-12-14 MED ORDER — DEXTROSE 5 % IV SOLN
1.0000 g | Freq: Three times a day (TID) | INTRAVENOUS | Status: DC
  Administered 2016-12-15 – 2016-12-19 (×14): 1 g via INTRAVENOUS
  Filled 2016-12-14 (×16): qty 1

## 2016-12-14 MED ORDER — ALBUTEROL SULFATE (2.5 MG/3ML) 0.083% IN NEBU: 2.5000 mg | INHALATION_SOLUTION | RESPIRATORY_TRACT | Status: DC | PRN

## 2016-12-14 MED ORDER — VANCOMYCIN HCL IN DEXTROSE 750-5 MG/150ML-% IV SOLN
750.0000 mg | Freq: Three times a day (TID) | INTRAVENOUS | Status: DC
  Administered 2016-12-15 – 2016-12-18 (×11): 750 mg via INTRAVENOUS
  Filled 2016-12-14 (×12): qty 150

## 2016-12-14 MED ORDER — LORAZEPAM 2 MG/ML IJ SOLN
1.0000 mg | INTRAMUSCULAR | Status: DC | PRN
  Administered 2016-12-14 – 2016-12-16 (×4): 1 mg via INTRAVENOUS
  Filled 2016-12-14 (×4): qty 1

## 2016-12-14 MED ORDER — SODIUM CHLORIDE 0.9 % IV BOLUS (SEPSIS)
500.0000 mL | Freq: Once | INTRAVENOUS | Status: AC
  Administered 2016-12-15: 500 mL via INTRAVENOUS

## 2016-12-14 MED ORDER — SODIUM CHLORIDE 0.9% FLUSH
3.0000 mL | Freq: Two times a day (BID) | INTRAVENOUS | Status: DC
  Administered 2016-12-14 – 2016-12-22 (×15): 3 mL via INTRAVENOUS

## 2016-12-14 MED ORDER — GUAIFENESIN 100 MG/5ML PO SOLN
5.0000 mL | ORAL | Status: DC | PRN
  Administered 2016-12-15 – 2016-12-22 (×11): 100 mg via ORAL
  Filled 2016-12-14 (×3): qty 10
  Filled 2016-12-14: qty 100
  Filled 2016-12-14 (×7): qty 10

## 2016-12-14 MED ORDER — HYDROCODONE-ACETAMINOPHEN 5-325 MG PO TABS
1.0000 | ORAL_TABLET | ORAL | Status: DC | PRN
  Administered 2016-12-18: 1 via ORAL
  Administered 2016-12-23: 2 via ORAL
  Filled 2016-12-14: qty 2
  Filled 2016-12-14: qty 1
  Filled 2016-12-14: qty 2

## 2016-12-14 NOTE — ED Notes (Signed)
Made two blood draw attempts for second blood culture.  Second RN attempting.

## 2016-12-14 NOTE — ED Notes (Signed)
Patient transported to X-ray 

## 2016-12-14 NOTE — ED Notes (Signed)
2 unsuccessful attempts at obtaining 2nd set of blood cultures primary nurse notified

## 2016-12-14 NOTE — ED Provider Notes (Signed)
[facility] DEPT Provider Note   CSN: [id number] Arrival date & time: 12/14/16  1841     History   Chief Complaint Chief Complaint  Patient presents with  . Shortness of Breath    HPI Meyli TIERRA RIEBELING is a 30 y.o. female.  30 year old female with a history of Hodgkin's lymphoma presents with increasing dyspnea times several days. Patient has a known right-sided pleural effusion and left the hospital AMA after being treated for a pulmonary infection 9 days ago. She is not followed up since then. She has not had any chemotherapy in over one month. Notes no chest pain or leg discomfort. No recent blood loss. Symptoms better with rest and worse with exertion. She also notes profound swelling to the right side of her body including the face arm and leg. No treatment used for this prior to arrival and nothing seems to make her symptoms better worse      Past Medical History:  Diagnosis Date  . ARDS (adult respiratory distress syndrome) ([place])   . Asthma   . Hodgkin lymphoma ([place])   . Hypertension     Patient Active Problem List   Diagnosis Date Noted  . Anemia of chronic disease 11/26/2016  . CAP (community acquired pneumonia) 11/23/2016  . PNA (pneumonia) 11/23/2016  . Hyperpigmentation 06/04/2016  . Hypersensitivity reaction 05/04/2016  . Encounter for central line placement   . Hypokalemia 04/20/2016  . Volume overload 04/20/2016  . S/P bronchoscopy with biopsy   . 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 HCAP (healthcare-associated pneumonia) 04/10/2016  . Hyponatremia 04/10/2016  . Thrombocytosis ([place]) 04/10/2016  . Pneumonitis 03/16/2016  . Postobstructive pneumonia 03/16/2016  . Acute respiratory failure with hypoxia ([place]) 03/16/2016  . Leukocytosis 03/16/2016  . Sepsis due to pneumonia ([place]) 03/16/2016  . Lung mass   . Lymphadenopathy     Past Surgical History:  Procedure Laterality Date  . AXILLARY LYMPH NODE  BIOPSY Right 03/19/2016   Procedure: AXILLARY LYMPH NODE BIOPSY;  Surgeon: Armandina Gemma, MD;  Location: [facility];  Service: General;  Laterality: Right;  [name] Kitchen VIDEO BRONCHOSCOPY Bilateral 11/26/2016   Procedure: VIDEO BRONCHOSCOPY WITH FLUORO;  Surgeon: Rigoberto Noel, MD;  Location: [facility] END[facility]COPY;  Service: Cardiopulmonary;  Laterality: Bilateral;    OB History    No data available       Home Medications    Prior to Admission medications   Medication Sig Start Date End Date Taking? Authorizing Provider  dexamethasone (DECADRON) 4 MG tablet 2 tab (8mg ) twice a day for 3 days starting on day of chemotherapy 06/15/16   Brunetta Genera, MD  guaiFENesin (MUCINEX) 600 MG 12 hr tablet Take 1,200 mg by mouth 2 (two) times daily as needed for cough or to loosen phlegm.    Historical Provider, MD  levalbuterol Penne Lash HFA) 45 MCG/ACT inhaler Inhale 2 puffs into the lungs every 6 (six) hours as needed for wheezing. 03/23/16   Erick Colace, NP  lidocaine-prilocaine (EMLA) cream Apply 1 application topically as needed (numbing).     Historical Provider, MD  loratadine (CLARITIN) 10 MG tablet Take 1 tablet (10 mg total) by mouth daily. For up to 5 days following Neulasta injection. 06/15/16   Brunetta Genera, MD  metoprolol (LOPRESSOR) 50 MG tablet Take 1 tablet (50 mg total) by mouth 2 (two) times daily. Patient not taking: Reported on 11/23/2016 04/26/16   Verlee Monte, MD  ondansetron (ZOFRAN) 8 MG tablet Take  1 tablet (8 mg total) by mouth every 8 (eight) hours as needed for nausea (start on the 3rd day after chemotherapy). 05/03/16   Brunetta Genera, MD  prochlorperazine (COMPAZINE) 10 MG tablet Take 1 tablet (10 mg total) by mouth every 6 (six) hours as needed for nausea or vomiting. 05/03/16   Brunetta Genera, MD    Family History History reviewed. No pertinent family history.  Social History Social History  Substance Use Topics  . Smoking status: Former Smoker    Packs/day: 0.50     Years: 15.00    Types: Cigarettes    Quit date: 03/27/2016  . Smokeless tobacco: Never Used  . Alcohol use Yes     Comment: occasional now     Allergies   Patient has no known allergies.   Review of Systems Review of Systems  All other systems reviewed and are negative.    Physical Exam Updated Vital Signs BP 123/86 (BP Location: Left Arm)   Pulse (!) 128   Temp 98.7 F (37.1 C) (Oral)   Resp 16   Ht 5\' 7"  (1.702 m)   Wt 65.8 kg   LMP 10/13/2016 (Approximate) Comment: patient was shielded  SpO2 92%   BMI 22.71 kg/m   Physical Exam  Constitutional: She is oriented to person, place, and time. She appears well-developed and well-nourished.  Non-toxic appearance. No distress.  HENT:  Head: Normocephalic and atraumatic.    Eyes: Conjunctivae, EOM and lids are normal. Pupils are equal, round, and reactive to light.  Neck: Normal range of motion. Neck supple. No tracheal deviation present. No thyroid mass present.  Cardiovascular: Normal rate, regular rhythm and normal heart sounds.  Exam reveals no gallop.   No murmur heard. Pulmonary/Chest: Effort normal and breath sounds normal. No stridor. No respiratory distress. She has no decreased breath sounds. She has no wheezes. She has no rhonchi. She has no rales.  Abdominal: Soft. Normal appearance and bowel sounds are normal. She exhibits no distension. There is no tenderness. There is no rebound and no CVA tenderness.  Musculoskeletal: Normal range of motion. She exhibits no edema or tenderness.       Right elbow: She exhibits swelling.       Right ankle: She exhibits swelling.  Neurological: She is alert and oriented to person, place, and time. She has normal strength. No cranial nerve deficit or sensory deficit. GCS eye subscore is 4. GCS verbal subscore is 5. GCS motor subscore is 6.  Skin: Skin is warm and dry. No abrasion and no rash noted.  Psychiatric: She has a normal mood and affect. Her speech is normal and behavior  is normal.  Nursing note and vitals reviewed.    ED Treatments / Results  Labs (all labs ordered are listed, but only abnormal results are displayed) Labs Reviewed  CULTURE, BLOOD (ROUTINE X 2)  CULTURE, BLOOD (ROUTINE X 2)  CBC  COMPREHENSIVE METABOLIC PANEL  BRAIN NATRIURETIC PEPTIDE  I-STAT TROPOININ, ED  I-STAT CG4 LACTIC ACID, ED    EKG  EKG Interpretation  Date/Time:  Tuesday December 14 2016 18:58:23 EST Ventricular Rate:  123 PR Interval:    QRS Duration: 58 QT Interval:  289 QTC Calculation: 414 R Axis:   90 Text Interpretation:  Sinus tachycardia Anteroseptal infarct, age indeterminate Baseline wander in lead(s) III aVL No significant change since last tracing Confirmed by KNAPP  MD-J, JON [id number]) on 12/14/2016 7:06:43 PM       Radiology Dg Chest 2  View  Result Date: 12/14/2016 CLINICAL DATA:  Shortness of breath for 1 week. History of Hodgkin's lymphoma. EXAM: CHEST  2 VIEW COMPARISON:  11/26/2016 and 11/23/2016 FINDINGS: There is now complete opacification of the right hemithorax. Based on the previous CT, this is probably related to consolidation. Pleural fluid cannot be excluded. Again noted are patchy densities in the mid left lung which could be infectious but worrisome for a neoplastic process. Heart size is grossly stable. Trachea is mildly deviated towards the left. Patient is a right jugular Port-A-Cath with the tip in the right innominate vein region. The catheter tip has pulled back since the original placement images and there is a small loop within the right internal jugular vein. IMPRESSION: Complete opacification of the right hemithorax. Based on the previous chest CT, this probably represents diffuse consolidation. Pleural fluid cannot be excluded. Persistent nodular density in the mid left lung. This has minimally changed since 11/26/2016. Although this could be infectious, neoplastic process cannot be excluded in the left lung. The right jugular  Port-A-Cath tip remains in the right innominate vein. However, a portion of the tube is coiled in the right internal jugular vein which is not ideal and consider removal and/or repositioning. Electronically Signed   By: Markus Daft M.D.   On: 12/14/2016 19:39    Procedures Procedures (including critical care time)  Medications Ordered in ED Medications - No data to display   Initial Impression / Assessment and Plan / ED Course  I have reviewed the triage vital signs and the nursing notes.  Pertinent labs & imaging results that were available during my care of the patient were reviewed by me and considered in my medical decision making (see chart for details).  Clinical Course     Patient has opacification of the right hemothorax. She has not had any fever and this could be either fluid or possibly even consolidation. She remains dyspneic and tachycardic. She is on oxygen at this time. Discussed with hospitalist and patient will be admitted to stepdown  Final Clinical Impressions(s) / ED Diagnoses   Final diagnoses:  None    New Prescriptions New Prescriptions   No medications on file     Lacretia Leigh, MD 12/14/16 2206

## 2016-12-14 NOTE — Progress Notes (Signed)
CRITICAL VALUE ALERT  Critical value received:  LA 2.2  Date of notification:  12/14/16  Time of notification:  2355  Critical value read back:Yes.    Nurse who received alert:  Kennis Carina, RN  MD notified (1st page):  Lamar Blinks NP  Time of first page:  2356  MD notified (2nd page):  Time of second page:  Responding MD:  Lamar Blinks, NP  Time MD responded:  [phone]

## 2016-12-14 NOTE — ED Triage Notes (Addendum)
PT C/O SOB AND SWELLING TO THE RIGHT-SIDE OF HER BODY X1 WEEK. PT STS SHE WAS RECENTLY TREATED FOR PNEUMONIA AND FINISHED THE COURSE OF MEDICATION. PT STS THE RIGHT SIDE OF HER FACE, RIGHT BREAST, RIGHT ARM, RIGHT LEG, FOOT AND ANKLE ARE SWOLLEN. PT DENIES COUGH OR FEVER. O2 82% ON RA. PT CURRENTLY ON CHEMO FOR HODGKIN'S LYMPHOMA. LAST CHEMO WAS 1 WEEK AGO.

## 2016-12-15 ENCOUNTER — Inpatient Hospital Stay: Payer: Self-pay | Admitting: Adult Health

## 2016-12-15 ENCOUNTER — Inpatient Hospital Stay (HOSPITAL_COMMUNITY): Payer: Medicaid Other

## 2016-12-15 ENCOUNTER — Ambulatory Visit
Admit: 2016-12-15 | Discharge: 2016-12-15 | Disposition: A | Discharge status: Home / Self Care | Payer: Medicaid Other | Attending: Radiation Oncology | Admitting: Radiation Oncology

## 2016-12-15 ENCOUNTER — Encounter (HOSPITAL_COMMUNITY): Payer: Self-pay

## 2016-12-15 DIAGNOSIS — C8128 Mixed cellularity classical Hodgkin lymphoma, lymph nodes of multiple sites: Secondary | ICD-10-CM

## 2016-12-15 DIAGNOSIS — I871 Compression of vein: Secondary | ICD-10-CM

## 2016-12-15 DIAGNOSIS — Z720 Tobacco use: Secondary | ICD-10-CM

## 2016-12-15 DIAGNOSIS — J9601 Acute respiratory failure with hypoxia: Secondary | ICD-10-CM

## 2016-12-15 LAB — EXPECTORATED SPUTUM ASSESSMENT W GRAM STAIN, RFLX TO RESP C

## 2016-12-15 LAB — CBC WITH DIFFERENTIAL/PLATELET
BASOS ABS: 0 10*3/uL (ref 0.0–0.1)
BASOS PCT: 0 %
EOS ABS: 0.3 10*3/uL (ref 0.0–0.7)
EOS PCT: 2 %
HCT: 34.1 % — ABNORMAL LOW (ref 36.0–46.0)
Hemoglobin: 11.1 g/dL — ABNORMAL LOW (ref 12.0–15.0)
Lymphocytes Relative: 3 %
Lymphs Abs: 0.4 10*3/uL — ABNORMAL LOW (ref 0.7–4.0)
MCH: 30.1 pg (ref 26.0–34.0)
MCHC: 32.6 g/dL (ref 30.0–36.0)
MCV: 92.4 fL (ref 78.0–100.0)
MONO ABS: 1 10*3/uL (ref 0.1–1.0)
Monocytes Relative: 7 %
Neutro Abs: 12.3 10*3/uL — ABNORMAL HIGH (ref 1.7–7.7)
Neutrophils Relative %: 88 %
PLATELETS: 815 10*3/uL — AB (ref 150–400)
RBC: 3.69 MIL/uL — AB (ref 3.87–5.11)
RDW: 15.1 % (ref 11.5–15.5)
WBC: 14 10*3/uL — AB (ref 4.0–10.5)

## 2016-12-15 LAB — COMPREHENSIVE METABOLIC PANEL
ALBUMIN: 2.3 g/dL — AB (ref 3.5–5.0)
ALT: 12 U/L — AB (ref 14–54)
ANION GAP: 7 (ref 5–15)
AST: 15 U/L (ref 15–41)
Alkaline Phosphatase: 101 U/L (ref 38–126)
BUN: 7 mg/dL (ref 6–20)
CHLORIDE: 95 mmol/L — AB (ref 101–111)
CO2: 33 mmol/L — AB (ref 22–32)
Calcium: 7.9 mg/dL — ABNORMAL LOW (ref 8.9–10.3)
Creatinine, Ser: 0.42 mg/dL — ABNORMAL LOW (ref 0.44–1.00)
GFR calc non Af Amer: >60 mL/min (ref >60)
Glucose, Bld: 118 mg/dL — ABNORMAL HIGH (ref 65–99)
Potassium: 4.1 mmol/L (ref 3.5–5.1)
SODIUM: 135 mmol/L (ref 135–145)
TOTAL PROTEIN: 5 g/dL — AB (ref 6.5–8.1)
Total Bilirubin: 0.6 mg/dL (ref 0.3–1.2)

## 2016-12-15 LAB — STREP PNEUMONIAE URINARY ANTIGEN: STREP PNEUMO URINARY ANTIGEN: NEGATIVE

## 2016-12-15 LAB — MRSA PCR SCREENING: MRSA BY PCR: NEGATIVE

## 2016-12-15 LAB — LACTIC ACID, PLASMA: LACTIC ACID, VENOUS: 1.6 mmol/L (ref 0.5–1.9)

## 2016-12-15 MED ORDER — PANTOPRAZOLE SODIUM 40 MG PO TBEC
40.0000 mg | DELAYED_RELEASE_TABLET | Freq: Every day | ORAL | Status: DC
  Administered 2016-12-15 – 2016-12-23 (×9): 40 mg via ORAL
  Filled 2016-12-15 (×9): qty 1

## 2016-12-15 MED ORDER — SODIUM CHLORIDE 0.9 % IV SOLN
INTRAVENOUS | Status: DC
  Administered 2016-12-15 – 2016-12-16 (×2): via INTRAVENOUS

## 2016-12-15 MED ORDER — METHYLPREDNISOLONE SODIUM SUCC 125 MG IJ SOLR
60.0000 mg | Freq: Four times a day (QID) | INTRAMUSCULAR | Status: DC
  Administered 2016-12-15 – 2016-12-23 (×34): 60 mg via INTRAVENOUS
  Filled 2016-12-15 (×35): qty 2

## 2016-12-15 MED ORDER — ALBUMIN HUMAN 25 % IV SOLN
25.0000 g | Freq: Once | INTRAVENOUS | Status: AC
  Administered 2016-12-15: 25 g via INTRAVENOUS
  Filled 2016-12-15: qty 100

## 2016-12-15 MED ORDER — METOPROLOL TARTRATE 25 MG PO TABS
50.0000 mg | ORAL_TABLET | Freq: Two times a day (BID) | ORAL | Status: DC
  Administered 2016-12-15: 50 mg via ORAL
  Filled 2016-12-15 (×2): qty 2

## 2016-12-15 MED ORDER — ORAL CARE MOUTH RINSE
15.0000 mL | Freq: Two times a day (BID) | OROMUCOSAL | Status: DC
  Administered 2016-12-15 – 2016-12-23 (×11): 15 mL via OROMUCOSAL

## 2016-12-15 MED ORDER — IOPAMIDOL (ISOVUE-300) INJECTION 61%
INTRAVENOUS | Status: AC
  Filled 2016-12-15: qty 75

## 2016-12-15 MED ORDER — SODIUM CHLORIDE 0.9 % IV BOLUS (SEPSIS): 1000.0000 mL | Freq: Once | INTRAVENOUS | Status: DC

## 2016-12-15 MED ORDER — LEVALBUTEROL HCL 0.63 MG/3ML IN NEBU
0.6300 mg | INHALATION_SOLUTION | Freq: Four times a day (QID) | RESPIRATORY_TRACT | Status: DC
  Administered 2016-12-15 – 2016-12-17 (×8): 0.63 mg via RESPIRATORY_TRACT
  Filled 2016-12-15 (×11): qty 3

## 2016-12-15 MED ORDER — IOPAMIDOL (ISOVUE-300) INJECTION 61%: 75.0000 mL | Freq: Once | INTRAVENOUS | Status: DC | PRN

## 2016-12-15 MED ORDER — HYDRALAZINE HCL 20 MG/ML IJ SOLN
10.0000 mg | Freq: Three times a day (TID) | INTRAMUSCULAR | Status: DC | PRN
  Administered 2016-12-16 – 2016-12-17 (×3): 10 mg via INTRAVENOUS
  Filled 2016-12-15 (×3): qty 1

## 2016-12-15 NOTE — Progress Notes (Signed)
PCCM Consult Note  Admission date: 12/14/2016 Consult date: 12/15/2016 Referring provider: Dr. Carolin Sicks, [facility]  CC: Short of breath  HPI: 30 yo female was admitted in March 2017 with respiratory failure.  She was found to have Hodgkin's lymphoma with lung involvement in April 2017.  She has been followed by oncology.  She has been on tx with AVD w/o bleomycin due to lung involvement with low DLCO 37%.  She had difficulty following through with treatment sessions due to issues with transportation.  She was admitted at end of November 2017 with dyspnea with concerns for pneumonia.  She had repeat bronchoscopy on 11/26/16 which showed Hodgkin's lymphoma, and cultures were negative.  She was discharged 11/27/16 at patient's request.  It does not appear that patient followed up with oncology.    She returned to ER on 12/14/16 with progressive dyspnea and right chest/arm/breast swelling.  CXR showed progression of right lung consolidative changes.  She was hypoxia and required supplemental oxygen.  She was started on broad spectrum antibiotics.  She had sinus tachycardia and no response to fluid challenges.  She reports her breathing is okay at rest, but she has trouble if she moves.  She is not having chest pain.  She has dry cough.  She denies headache or difficulty with her vision.  She is not having trouble with her swallowing.   SUBJECTIVE:  No acute events since admission. Patient continuing to report dyspnea that seems to fluctuate. Denies any significant cough. Denies any chest pain or pressure. Patient is refusing blood draws and subcutaneous Lovenox.   REVIEW OF SYSTEMS:  No abdominal pain, nausea, or vomiting. Patient is hungry. No subjective fever or chills.  Vital signs: BP (!) 149/84 (BP Location: Left Arm)   Pulse (!) 140   Temp 99.9 F (37.7 C) (Oral)   Resp (!) 32   Ht 5\' 7"  (1.702 m)   Wt 164 lb 3.9 oz (74.5 kg)   LMP 10/13/2016 (Approximate) Comment: patient was shielded   SpO2 95%   BMI 25.72 kg/m   Intake/output: I/O last 3 completed shifts: In: 1100 [P.O.:350; IV Piggyback:750] Out: 52 [Urine:51; Stool:1]  General:  Awake. Alert. No acute distress. Sitting watching TV. No family at bedside.  Integument:  Warm & dry. No rash on exposed skin.  HEENT:  Moist mucus membranes. No oral ulcers. No scleral injection or icterus. Cardiovascular:  Tachycardic with regular rhythm. No edema.  Pulmonary:  Decreased aeration in left lung base with bronchial breath sounds in right mid lung apex. Mildly increased work of breathing on nasal cannula oxygen. Speaking in complete sentences. Abdomen: Soft. Normal bowel sounds. Nondistended.  Musculoskeletal:  Normal bulk and tone. No joint deformity or effusion appreciated.   CMP Latest Ref Rng & Units 12/15/2016 12/14/2016 11/23/2016  Glucose 65 - 99 mg/dL 118(H) 117(H) 115(H)  BUN 6 - 20 mg/dL 7 7 <5(L)  Creatinine 0.44 - 1.00 mg/dL 0.42(L) 0.50 0.60  Sodium 135 - 145 mmol/L 135 136 132(L)  Potassium 3.5 - 5.1 mmol/L 4.1 4.1 3.8  Chloride 101 - 111 mmol/L 95(L) 95(L) 96(L)  CO2 22 - 32 mmol/L 33(H) 34(H) 28  Calcium 8.9 - 10.3 mg/dL 7.9(L) 7.8(L) 7.8(L)  Total Protein 6.5 - 8.1 g/dL 5.0(L) 4.6(L) 5.0(L)  Total Bilirubin 0.3 - 1.2 mg/dL 0.6 0.7 0.6  Alkaline Phos 38 - 126 U/L 101 116 59  AST 15 - 41 U/L 15 18 19   ALT 14 - 54 U/L 12(L) 12(L) 9(L)  CBC Latest Ref Rng & Units 12/15/2016 12/14/2016 11/23/2016  WBC 4.0 - 10.5 K/uL 14.0(H) 13.5(H) 11.9(H)  Hemoglobin 12.0 - 15.0 g/dL 11.1(L) 11.5(L) 11.8(L)  Hematocrit 36.0 - 46.0 % 34.1(L) 35.8(L) 35.9(L)  Platelets 150 - 400 K/uL 815(H) 879(H) 608(H)    ABG    Component Value Date/Time   PHART 7.375 04/18/2016 0934   PCO2ART 69.8 (HH) 04/18/2016 0934   PO2ART 60.2 (L) 04/18/2016 0934   HCO3 39.9 (H) 04/18/2016 0934   TCO2 37.0 04/18/2016 0934   O2SAT 87.2 04/18/2016 0934    CBG (last 3)  No results for input(s): GLUCAP in the last 72  hours.   Imaging: Dg Chest 2 View  Result Date: 12/14/2016 CLINICAL DATA:  Shortness of breath for 1 week. History of Hodgkin's lymphoma. EXAM: CHEST  2 VIEW COMPARISON:  11/26/2016 and 11/23/2016 FINDINGS: There is now complete opacification of the right hemithorax. Based on the previous CT, this is probably related to consolidation. Pleural fluid cannot be excluded. Again noted are patchy densities in the mid left lung which could be infectious but worrisome for a neoplastic process. Heart size is grossly stable. Trachea is mildly deviated towards the left. Patient is a right jugular Port-A-Cath with the tip in the right innominate vein region. The catheter tip has pulled back since the original placement images and there is a small loop within the right internal jugular vein. IMPRESSION: Complete opacification of the right hemithorax. Based on the previous chest CT, this probably represents diffuse consolidation. Pleural fluid cannot be excluded. Persistent nodular density in the mid left lung. This has minimally changed since 11/26/2016. Although this could be infectious, neoplastic process cannot be excluded in the left lung. The right jugular Port-A-Cath tip remains in the right innominate vein. However, a portion of the tube is coiled in the right internal jugular vein which is not ideal and consider removal and/or repositioning. Electronically Signed   By: Markus Daft M.D.   On: 12/14/2016 19:39   Ct Chest W Contrast  Result Date: 12/15/2016 CLINICAL DATA:  30 year old female with lung mass and right chest and arm and breast swelling. 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 EXAM: CT CHEST WITH CONTRAST TECHNIQUE: Multidetector CT imaging of the chest was performed during intravenous contrast administration. CONTRAST:  75 cc Isovue-300 COMPARISON:  Chest radiograph dated 12/14/2016 and CT dated 11/23/2016 FINDINGS: Cardiovascular: There is no cardiomegaly or pericardial effusion. There is mild shift of the heart  and mediastinum into the left hemithorax secondary to mass effect caused by right lung mass/consolidation. The thoracic aorta appears unremarkable. The origins of the great vessels of the aortic arch appear patent. There is dilatation of the main pulmonary trunk indicative of underlying pulmonary hypertension. Evaluation for pulmonary embolism is very limited due to suboptimal opacification of the vasculature and respiratory motion artifact. No filling defect identified within the pulmonary trunk or central portion of the main pulmonary arteries. Right pectoral Port-A-Cath again noted which extends up into the right IJ. The catheter then turns down with tip in the right brachiocephalic vein in similar appearance as the prior CT. There is apparent occlusion of the right brachiocephalic vein as well as a degree of occlusion or stricture of the right IJ. There is non opacification of the right subclavian vein. Mediastinum/Nodes: Anterior mediastinal mass/ lymphadenopathy measures 2.9 x 3.6 cm (previously approximately 2.1 x 3.9 cm). Right paratracheal adenopathy measures up to 16 mm in short axis (previously 13 mm). Subcarinal adenopathy measures 17 mm in short axis  grossly similar prior study. The esophagus is not well visualized. Lungs/Pleura: There has been interval progression of right lung consolidation now involving the entire right lung. There is compression and occlusion of the distal portion of the right mainstem bronchus. There is overall increased right lung volume with mass effect and mild displacement of the mediastinum into the left hemithorax. Multiple nodular densities in the left lung predominantly involving the left upper lobe have increased in size since the prior CT. The largest nodule or confluence of nodules measures approximately 17 x 15 mm (previously approximately 10 x 8 mm). These may be infectious in etiology or represent metastatic disease. There is a small right pleural effusion. No pleural  effusion noted on the left. There is no pneumothorax. Upper Abdomen: There is retroperitoneal adenopathy in the upper abdomen. Partially visualized perihepatic ascites. Musculoskeletal: Diffuse subcutaneous edema and anasarca. No drainable fluid collection. IMPRESSION: Interval progression of right lung mass with complete opacification of the right lung. Interval increase in the size of the left lung nodules which may represent progression of pneumonia or neoplastic disease. Clinical correlation is recommended. Non opacification of the right brachiocephalic vein and right IJ concerning for occlusion of these vessels. Progression of mediastinal adenopathy. Upper abdominal retroperitoneal adenopathy. Small right pleural effusion, partially visualized ascites, diffuse subcutaneous edema and anasarca. Electronically Signed   By: Anner Crete M.D.   On: 12/15/2016 06:39    STUDIES: Bronchoscopy 11/26/16: surgical path with Hodgkin's lymphoma, cultures negative CT CHEST W/ 12/15/16:  Personally reviewed by me. Complete opacification of right hemithorax. Pathologically enlarged mediastinal lymphadenopathy. Nodularity in left lung as well. Suggestion of SVC occlusion.   MICROBIOLOGY: BAL 12/1:  Normal respiratory flora / AFB pending / Fungus pending MRSA PCR 12/19:  Negative Blood Ctx x1 12/19 >> Blood Ctx x1 12/20 >> Sputum Ctx 12/20 >>  ANTIBIOTICS: Cefepime 12/19 >> Vancomycin 12/19 >>  LINES/TUBES: R Chest Port 05/17/16 >> PIV  EVENTS: 12/19 - admit 12/20 - Added solumedrol  ASSESSMENT/PLAN:  30 y.o. 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 Patient now refusing lab draws and also refusing subcutaneous Lovenox. Explained that the Lovenox is desiring to prevent blood clots and pulmonary emboli which could cause death and her hypercoagulable state from her underlying cancer. The patient  wishes to continue to decline Lovenox shots. Respiratory status remained stable but is somewhat tenuous. No signs of asthma exacerbation at this time. Patient is hungry and wishes to eat. For now we are continuing antibiotic therapy but I do not believe she has an infectious process ongoing. Patient's prognosis remains guarded which is unfortunate given her age but with her history of nonadherence to therapy raises the question whether or not palliative medicine should be involved. I will hold off on palliative medicine consult at this time.  1. Acute Hypoxic Respiratory Failure: Secondary to progression of lymphoma. Continuing supplemental oxygen. Continuing monitoring for possible intubation need. 2. Hodgkin's Lymphoma: Continuing patient on sodium Medrol 60 mg IV every 6 hours. Attempted to contact medical oncology & radiation oncology this morning but haven't heard back yet.  3. Possible Pneumonia: Doubtful. Cultures pending. Trending Procalcitonin per algorithm. Continuing empiric vancomycin & cefepime for now. 4. Asthma:  No signs of exacerbation. Continuing Xopenex nebulized every 6 hour. 5. Code Status:  Patient confirms she would be willing to be intubated if medically necessary.   Remainder of care as per primary service.   Creig Hines  Earney Hamburg, M.D. [facility] Pager:  [phone] After 3pm or if no response, call [phone] 12/15/2016, 8:00 AM

## 2016-12-15 NOTE — Progress Notes (Signed)
RN notified by central telemetry that they are unable to monitor pt. While at the cancer center due to signal loss.

## 2016-12-15 NOTE — Progress Notes (Signed)
Pt refused AM labs. RN discussed importance of blood work with patient, pt refused. Will report off to day shift. Will continue to monitor.

## 2016-12-15 NOTE — Progress Notes (Signed)
  Radiation Oncology         [phone] ________________________________  Name: Heidi [name] MRN: [id number]  Date: 12/15/2016  DOB: 10-16-1986  SIMULATION AND TREATMENT PLANNING NOTE    ICD-9-CM ICD-10-CM   1. 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  Identity was confirmed.  All relevant records and images related to the planned course of therapy were reviewed.  The patient freely provided informed written consent to proceed with treatment after reviewing the details related to the planned course of therapy. The consent form was witnessed and verified by the simulation staff.  Then, the patient was set-up in a stable reproducible  supine position for radiation therapy.  CT images were obtained.  Surface markings were placed.  The CT images were loaded into the planning software.  Then the target and avoidance structures were contoured.  Treatment planning then occurred.  The radiation prescription was entered and confirmed.  Then, I designed and supervised the construction of a total of 2 medically necessary complex treatment devices.  I have requested : 3D Simulation  I have requested a DVH of the following structures: left lung, right lung, spinal cord, heart, and target volumes.  I have ordered:Nutrition Consult and CBC  PLAN:  The patient will receive 17 Gy in 5 fractions.  ________________________________  Sheral Apley Tammi Klippel, M.D.

## 2016-12-15 NOTE — Consult Note (Signed)
PCCM Consult Note  Admission date: 12/14/2016 Consult date: 12/15/2016 Referring provider: Dr. Carolin Sicks, [facility]  CC: Short of breath  HPI: 30 yo female was admitted in March 2017 with respiratory failure.  She was found to have Hodgkin's lymphoma with lung involvement in April 2017.  She has been followed by oncology.  She has been on tx with AVD w/o bleomycin due to lung involvement with low DLCO 37%.  She had difficulty following through with treatment sessions due to issues with transportation.  She was admitted at end of November 2017 with dyspnea with concerns for pneumonia.  She had repeat bronchoscopy on 11/26/16 which showed Hodgkin's lymphoma, and cultures were negative.  She was discharged 11/27/16 at patient's request.  It does not appear that patient followed up with oncology.    She returned to ER on 12/14/16 with progressive dyspnea and right chest/arm/breast swelling.  CXR showed progression of right lung consolidative changes.  She was hypoxia and required supplemental oxygen.  She was started on broad spectrum antibiotics.  She had sinus tachycardia and no response to fluid challenges.  She reports her breathing is okay at rest, but she has trouble if she moves.  She is not having chest pain.  She has dry cough.  She denies headache or difficulty with her vision.  She is not having trouble with her swallowing.  PMHx: She  has a past medical history of ARDS (adult respiratory distress syndrome) ([place]); Asthma; Hodgkin lymphoma ([place]); and Hypertension.  PSHx: She  has a past surgical history that includes Axillary lymph node biopsy (Right, 03/19/2016) and Video bronchoscopy (Bilateral, 11/26/2016).  FHx: Her is negative for [place].  SHx: She  reports that she quit smoking about 8 months ago. Her smoking use included Cigarettes. She has a 7.50 pack-year smoking history. She has never used smokeless tobacco. She reports that she drinks alcohol.  Allergies: No Known  Allergies   No current facility-administered medications on file prior to encounter.    Current Outpatient Prescriptions on File Prior to Encounter  Medication Sig  . dexamethasone (DECADRON) 4 MG tablet 2 tab (8mg ) twice a day for 3 days starting on day of chemotherapy  . levalbuterol (XOPENEX HFA) 45 MCG/ACT inhaler Inhale 2 puffs into the lungs every 6 (six) hours as needed for wheezing.  . lidocaine-prilocaine (EMLA) cream Apply 1 application topically as needed (numbing).   [name] Kitchen loratadine (CLARITIN) 10 MG tablet Take 1 tablet (10 mg total) by mouth daily. For up to 5 days following Neulasta injection.  . ondansetron (ZOFRAN) 8 MG tablet Take 1 tablet (8 mg total) by mouth every 8 (eight) hours as needed for nausea (start on the 3rd day after chemotherapy).  . prochlorperazine (COMPAZINE) 10 MG tablet Take 1 tablet (10 mg total) by mouth every 6 (six) hours as needed for nausea or vomiting.  . metoprolol (LOPRESSOR) 50 MG tablet Take 1 tablet (50 mg total) by mouth 2 (two) times daily. (Patient not taking: Reported on 11/23/2016)    ROS: Negative except above  Vital signs: BP (!) 149/84 (BP Location: Left Arm)   Pulse (!) 131   Temp 98.7 F (37.1 C) (Oral)   Resp (!) 23   Ht 5\' 7"  (1.702 m)   Wt 164 lb 3.9 oz (74.5 kg)   LMP 10/13/2016 (Approximate) Comment: patient was shielded  SpO2 94%   BMI 25.72 kg/m   Intake/output: No intake/output data recorded.  General: ill appearing, mild increase WOB, speaks in full sentences Neuro: alert,  follows commands, moves all extremities HEENT: right face/neck fullness, no stridor Cardiac: regular, tachycardic, no murmur Chest: faint wheeze on Lt, decreased BS on right, Rt breast swelling Abd: soft, non tender Ext: no edema Skin: no rashes   CMP Latest Ref Rng & Units 12/15/2016 12/14/2016 11/23/2016  Glucose 65 - 99 mg/dL 118(H) 117(H) 115(H)  BUN 6 - 20 mg/dL 7 7 <5(L)  Creatinine 0.44 - 1.00 mg/dL 0.42(L) 0.50 0.60  Sodium 135  - 145 mmol/L 135 136 132(L)  Potassium 3.5 - 5.1 mmol/L 4.1 4.1 3.8  Chloride 101 - 111 mmol/L 95(L) 95(L) 96(L)  CO2 22 - 32 mmol/L 33(H) 34(H) 28  Calcium 8.9 - 10.3 mg/dL 7.9(L) 7.8(L) 7.8(L)  Total Protein 6.5 - 8.1 g/dL 5.0(L) 4.6(L) 5.0(L)  Total Bilirubin 0.3 - 1.2 mg/dL 0.6 0.7 0.6  Alkaline Phos 38 - 126 U/L 101 116 59  AST 15 - 41 U/L 15 18 19   ALT 14 - 54 U/L 12(L) 12(L) 9(L)    CBC Latest Ref Rng & Units 12/15/2016 12/14/2016 11/23/2016  WBC 4.0 - 10.5 K/uL 14.0(H) 13.5(H) 11.9(H)  Hemoglobin 12.0 - 15.0 g/dL 11.1(L) 11.5(L) 11.8(L)  Hematocrit 36.0 - 46.0 % 34.1(L) 35.8(L) 35.9(L)  Platelets 150 - 400 K/uL 815(H) 879(H) 608(H)    ABG    Component Value Date/Time   PHART 7.375 04/18/2016 0934   PCO2ART 69.8 (HH) 04/18/2016 0934   PO2ART 60.2 (L) 04/18/2016 0934   HCO3 39.9 (H) 04/18/2016 0934   TCO2 37.0 04/18/2016 0934   O2SAT 87.2 04/18/2016 0934    CBG (last 3)  No results for input(s): GLUCAP in the last 72 hours.   Imaging: Dg Chest 2 View  Result Date: 12/14/2016 CLINICAL DATA:  Shortness of breath for 1 week. History of Hodgkin's lymphoma. EXAM: CHEST  2 VIEW COMPARISON:  11/26/2016 and 11/23/2016 FINDINGS: There is now complete opacification of the right hemithorax. Based on the previous CT, this is probably related to consolidation. Pleural fluid cannot be excluded. Again noted are patchy densities in the mid left lung which could be infectious but worrisome for a neoplastic process. Heart size is grossly stable. Trachea is mildly deviated towards the left. Patient is a right jugular Port-A-Cath with the tip in the right innominate vein region. The catheter tip has pulled back since the original placement images and there is a small loop within the right internal jugular vein. IMPRESSION: Complete opacification of the right hemithorax. Based on the previous chest CT, this probably represents diffuse consolidation. Pleural fluid cannot be excluded. Persistent  nodular density in the mid left lung. This has minimally changed since 11/26/2016. Although this could be infectious, neoplastic process cannot be excluded in the left lung. The right jugular Port-A-Cath tip remains in the right innominate vein. However, a portion of the tube is coiled in the right internal jugular vein which is not ideal and consider removal and/or repositioning. Electronically Signed   By: Markus Daft M.D.   On: 12/14/2016 19:39    Studies: Bronchoscopy 11/26/16 >> surgical path with Hodgkin's lymphoma, cultures negative  Cultures: Blood 12/19 >>  Antibiotics: Cefepime 12/19 >> Vancomycin 12/19 >>  Lines/tubes: Rt port 05/17/16 >>  Events: 12/19 admit 12/20 Add solumedrol  Summary: 30 yo female smoker with progress Rt lung consolidation, and Rt chest/neck swelling related to progression of Hodgkin's lymphoma.  She might also have component of post obstructive pneumonia, but culture results from recent bronchoscopy were negative.  She has tenuous respiratory status, but  is maintaining herself on supplemental oxygen at present.  Assessment/plan:  Rt lung consolidation most likely from progression of Hodgkin's lymphoma. - add solumedrol - needs oncology and radiation oncology to assess in morning to initiate emergent therapy - continue Abx for now and check procalcitonin  Acute hypoxic respiratory failure. - oxygen to keep SpO2 > 92% - monitor need for intubation - she is intolerant of Bipap therapy during previous admissions  Asthma. - scheduled BDs  DVT prophylaxis - lovenox SUP - Protonix Nutrition - clear liquids Goals of care - full code  CC time 37 minutes  Chesley Mires, MD [place] 12/15/2016, 4:42 AM Pager:  [phone] After 3pm call: [phone]

## 2016-12-15 NOTE — Progress Notes (Addendum)
[name] Kitchen   HEMATOLOGY/ONCOLOGY INPATIENT PROGRESS NOTE  Date of Service: 12/15/2016  Inpatient Attending: .Dron Tanna Furry, MD   SUBJECTIVE  Patient was seen in the ICU for follow-up of her Hodgkin's lymphoma . After her recent hospitalization she was scheduled for follow-up in clinic immediately and missed several clinic appointments despite repeated calls from the clinic regarding the urgency of starting second line therapy for her Hodgkin's lymphoma. Patient has had a history of noncompliance with medical follow-up and this appears to be getting worse.  She is now admitted with worsening respiratory failure with significantly compromise right lung and with SVC/central venous obstruction with a right-sided face swelling.  We had a frank discussion regarding what her treatment intentions are. Whether she would like to focus on being kept comfortable and not pursue any treatment and go on hospice vs having aggressive treatment for her potentially curable CHL. She understands the concern with her disease progression and that she will need very close for fairly involved treatment and likely referral for consideration of an autologous transplant.   OBJECTIVE:  Mild distress from SOB and rt facial swelling.  PHYSICAL EXAMINATION: . Vitals:   12/15/16 0437 12/15/16 0500 12/15/16 0600 12/15/16 0836  BP:      Pulse:  (!) 134 (!) 140   Resp:  (!) 26 (!) 32   Temp: 99.9 F (37.7 C)     TempSrc: Oral     SpO2:  93% 95% 95%  Weight:      Height:       Filed Weights   12/14/16 1900 12/14/16 2255  Weight: 145 lb (65.8 kg) 164 lb 3.9 oz (74.5 kg)   .Body mass index is 25.72 kg/m.  GENERAL:alert,mild distress from SOB SKIN: no acute rashes EYES: normal, conjunctiva are pink and non-injected, sclera clear Rt facial and neck swelling. OROPHARYNX:no exudate, MMM NECK: rt sided neck swelling noted. LYMPH: palpable small cervical lymphadenopathy LUNGS: severely reduced rt sided air  entry HEART: rs1s2 tachycardic ABDOMEN: abdomen soft, non-tender, normoactive bowel sounds  PSYCH: alert & oriented x 3 with fluent speech NEURO: no focal motor/sensory deficits  MEDICAL HISTORY:  Past Medical History:  Diagnosis Date  . ARDS (adult respiratory distress syndrome) ([place])   . Asthma   . Hodgkin lymphoma ([place])   . Hypertension     SURGICAL HISTORY: Past Surgical History:  Procedure Laterality Date  . AXILLARY LYMPH NODE BIOPSY Right 03/19/2016   Procedure: AXILLARY LYMPH NODE BIOPSY;  Surgeon: Armandina Gemma, MD;  Location: [facility];  Service: General;  Laterality: Right;  [name] Kitchen VIDEO BRONCHOSCOPY Bilateral 11/26/2016   Procedure: VIDEO BRONCHOSCOPY WITH FLUORO;  Surgeon: Rigoberto Noel, MD;  Location: [facility];  Service: Cardiopulmonary;  Laterality: Bilateral;    SOCIAL HISTORY: Social History   Social History  . Marital status: Single    Spouse name: N/A  . Number of children: N/A  . Years of education: N/A   Occupational History  . Not on file.   Social History Main Topics  . Smoking status: Former Smoker    Packs/day: 0.50    Years: 15.00    Types: Cigarettes    Quit date: 03/27/2016  . Smokeless tobacco: Never Used  . Alcohol use Yes     Comment: occasional now  . Drug use: Unknown  . Sexual activity: Not on file   Other Topics Concern  . Not on file   Social History Narrative  . No narrative on file    FAMILY HISTORY: History reviewed.  No pertinent family history.  ALLERGIES:  has No Known Allergies.  MEDICATIONS:  Scheduled Meds: . ceFEPime (MAXIPIME) IV  1 g Intravenous Q8H  . enoxaparin (LOVENOX) injection  40 mg Subcutaneous QHS  . levalbuterol  0.63 mg Nebulization Q6H  . mouth rinse  15 mL Mouth Rinse BID  . methylPREDNISolone (SOLU-MEDROL) injection  60 mg Intravenous Q6H  . metoprolol tartrate  50 mg Oral BID  . pantoprazole  40 mg Oral Daily  . sodium chloride  1,000 mL Intravenous Once  . sodium chloride flush  3 mL Intravenous  Q12H  . vancomycin  750 mg Intravenous Q8H   Continuous Infusions: PRN Meds:.sodium chloride, guaiFENesin, HYDROcodone-acetaminophen, iopamidol, LORazepam, morphine injection, sodium chloride flush  REVIEW OF SYSTEMS:    10 Point review of Systems was done is negative except as noted above.   LABORATORY DATA:  I have reviewed the data as listed  . CBC Latest Ref Rng & Units 12/15/2016 12/14/2016 11/23/2016  WBC 4.0 - 10.5 K/uL 14.0(H) 13.5(H) 11.9(H)  Hemoglobin 12.0 - 15.0 g/dL 11.1(L) 11.5(L) 11.8(L)  Hematocrit 36.0 - 46.0 % 34.1(L) 35.8(L) 35.9(L)  Platelets 150 - 400 K/uL 815(H) 879(H) 608(H)    . CMP Latest Ref Rng & Units 12/15/2016 12/14/2016 11/23/2016  Glucose 65 - 99 mg/dL 118(H) 117(H) 115(H)  BUN 6 - 20 mg/dL 7 7 <5(L)  Creatinine 0.44 - 1.00 mg/dL 0.42(L) 0.50 0.60  Sodium 135 - 145 mmol/L 135 136 132(L)  Potassium 3.5 - 5.1 mmol/L 4.1 4.1 3.8  Chloride 101 - 111 mmol/L 95(L) 95(L) 96(L)  CO2 22 - 32 mmol/L 33(H) 34(H) 28  Calcium 8.9 - 10.3 mg/dL 7.9(L) 7.8(L) 7.8(L)  Total Protein 6.5 - 8.1 g/dL 5.0(L) 4.6(L) 5.0(L)  Total Bilirubin 0.3 - 1.2 mg/dL 0.6 0.7 0.6  Alkaline Phos 38 - 126 U/L 101 116 59  AST 15 - 41 U/L 15 18 19   ALT 14 - 54 U/L 12(L) 12(L) 9(L)        RADIOGRAPHIC STUDIES: I have personally reviewed the radiological images as listed and agreed with the findings in the report. Dg Chest 2 View  Result Date: 12/14/2016 CLINICAL DATA:  Shortness of breath for 1 week. History of Hodgkin's lymphoma. EXAM: CHEST  2 VIEW COMPARISON:  11/26/2016 and 11/23/2016 FINDINGS: There is now complete opacification of the right hemithorax. Based on the previous CT, this is probably related to consolidation. Pleural fluid cannot be excluded. Again noted are patchy densities in the mid left lung which could be infectious but worrisome for a neoplastic process. Heart size is grossly stable. Trachea is mildly deviated towards the left. Patient is a right jugular  Port-A-Cath with the tip in the right innominate vein region. The catheter tip has pulled back since the original placement images and there is a small loop within the right internal jugular vein. IMPRESSION: Complete opacification of the right hemithorax. Based on the previous chest CT, this probably represents diffuse consolidation. Pleural fluid cannot be excluded. Persistent nodular density in the mid left lung. This has minimally changed since 11/26/2016. Although this could be infectious, neoplastic process cannot be excluded in the left lung. The right jugular Port-A-Cath tip remains in the right innominate vein. However, a portion of the tube is coiled in the right internal jugular vein which is not ideal and consider removal and/or repositioning. Electronically Signed   By: Markus Daft M.D.   On: 12/14/2016 19:39   Dg Chest 2 View  Result Date: 11/23/2016 CLINICAL  DATA:  Worsening coughing congestion over the past week. History of Hodgkin's lymphoma. EXAM: CHEST  2 VIEW COMPARISON:  PET-CT 07/22/2016.  Chest radiographs 04/21/2016. FINDINGS: Right jugular Port-A-Cath terminates in the region of the bright brachiocephalic vein as on the prior PET-CT. The cardiac silhouette again appears mildly enlarged, though the right heart border is obscured. There is mild patchy opacity in the left mid lung which has greatly improved from the 03/2016 chest radiographs and is similar to the more recent PET-CT. There is extensive consolidation throughout much of the right lung which is similar to the prior radiographs but substantially worsened compared to the more recent PET-CT. An underlying right pleural effusion is not excluded. No pneumothorax. No acute osseous abnormality. IMPRESSION: Extensive right lung consolidation, increased from 06/2016. Electronically Signed   By: Logan Bores M.D.   On: 11/23/2016 09:36   Ct Chest W Contrast  Result Date: 12/15/2016 CLINICAL DATA:  30 year old female with lung mass  and right chest and arm and breast swelling. 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 EXAM: CT CHEST WITH CONTRAST TECHNIQUE: Multidetector CT imaging of the chest was performed during intravenous contrast administration. CONTRAST:  75 cc Isovue-300 COMPARISON:  Chest radiograph dated 12/14/2016 and CT dated 11/23/2016 FINDINGS: Cardiovascular: There is no cardiomegaly or pericardial effusion. There is mild shift of the heart and mediastinum into the left hemithorax secondary to mass effect caused by right lung mass/consolidation. The thoracic aorta appears unremarkable. The origins of the great vessels of the aortic arch appear patent. There is dilatation of the main pulmonary trunk indicative of underlying pulmonary hypertension. Evaluation for pulmonary embolism is very limited due to suboptimal opacification of the vasculature and respiratory motion artifact. No filling defect identified within the pulmonary trunk or central portion of the main pulmonary arteries. Right pectoral Port-A-Cath again noted which extends up into the right IJ. The catheter then turns down with tip in the right brachiocephalic vein in similar appearance as the prior CT. There is apparent occlusion of the right brachiocephalic vein as well as a degree of occlusion or stricture of the right IJ. There is non opacification of the right subclavian vein. Mediastinum/Nodes: Anterior mediastinal mass/ lymphadenopathy measures 2.9 x 3.6 cm (previously approximately 2.1 x 3.9 cm). Right paratracheal adenopathy measures up to 16 mm in short axis (previously 13 mm). Subcarinal adenopathy measures 17 mm in short axis grossly similar prior study. The esophagus is not well visualized. Lungs/Pleura: There has been interval progression of right lung consolidation now involving the entire right lung. There is compression and occlusion of the distal portion of the right mainstem bronchus. There is overall increased right lung volume with mass effect and mild  displacement of the mediastinum into the left hemithorax. Multiple nodular densities in the left lung predominantly involving the left upper lobe have increased in size since the prior CT. The largest nodule or confluence of nodules measures approximately 17 x 15 mm (previously approximately 10 x 8 mm). These may be infectious in etiology or represent metastatic disease. There is a small right pleural effusion. No pleural effusion noted on the left. There is no pneumothorax. Upper Abdomen: There is retroperitoneal adenopathy in the upper abdomen. Partially visualized perihepatic ascites. Musculoskeletal: Diffuse subcutaneous edema and anasarca. No drainable fluid collection. IMPRESSION: Interval progression of right lung mass with complete opacification of the right lung. Interval increase in the size of the left lung nodules which may represent progression of pneumonia or neoplastic disease. Clinical correlation is recommended. Non opacification of the  right brachiocephalic vein and right IJ concerning for occlusion of these vessels. Progression of mediastinal adenopathy. Upper abdominal retroperitoneal adenopathy. Small right pleural effusion, partially visualized ascites, diffuse subcutaneous edema and anasarca. Electronically Signed   By: Anner Crete M.D.   On: 12/15/2016 06:39   Ct Angio Chest Pe W/cm &/or Wo Cm  Result Date: 11/23/2016 CLINICAL DATA:  Cough.  History of Hodgkin's lymphoma. EXAM: CT ANGIOGRAPHY CHEST WITH CONTRAST TECHNIQUE: Multidetector CT imaging of the chest was performed using the standard protocol during bolus administration of intravenous contrast. Multiplanar CT image reconstructions and MIPs were obtained to evaluate the vascular anatomy. CONTRAST:  100 mL of Isovue 370 intravenously. PET scan of July 22, 2016. COMPARISON:  CT scan of April 10, 2016. FINDINGS: Cardiovascular: There is no evidence of thoracic aortic dissection or aneurysm. There is no definite evidence of  pulmonary embolus. Mediastinum/Nodes: Mediastinal adenopathy is noted consistent with a history of lymphoma. Right internal jugular Port-A-Cath is noted which is looped within right internal jugular vein more superiorly, in distal tip is located in right brachiocephalic vein. Lungs/Pleura: Multifocal opacities are noted in the left lung which may represent pneumonia. There is nearly complete opacification of the right lung which is significantly worse compared to prior PET scan, most consistent with pneumonia. No pneumothorax is noted. Upper Abdomen: No definite abnormality seen in visualized portion of upper abdomen. Musculoskeletal: No significant osseous abnormality is noted. Review of the MIP images confirms the above findings. IMPRESSION: No definite evidence of pulmonary embolus. Stable mediastinal adenopathy consistent with history of lymphoma. Multiple small opacities are noted throughout the left lung concerning for pneumonia. There is nearly complete opacification of right lung most likely representing pneumonia, although infiltrative disease by lymphoma cannot be excluded. Electronically Signed   By: Marijo Conception, M.D.   On: 11/23/2016 11:40   Dg Chest Port 1 View  Result Date: 11/26/2016 CLINICAL DATA:  Status post bronchoscopy and right lower lobe biopsy. History of Hodgkin's lymphoma EXAM: PORTABLE CHEST 1 VIEW COMPARISON:  November 23, 2016 chest CT and chest radiograph FINDINGS: No pneumothorax. Consolidation throughout most of the right lung remains. There is patchy infiltrate in the left upper lobe. No new opacity evident compared to recent studies. Heart size is within normal limits. Pulmonary vascularity is obscured on the right and grossly normal on the left. Port-A-Cath tip is in the superior vena cava, unchanged. Right paratracheal adenopathy is stable. IMPRESSION: No pneumothorax. Widespread consolidation on the right and patchy infiltrate left upper lobe remain stable. Stable cardiac  silhouette. There is right paratracheal adenopathy. Stable Port-A-Cath position. Electronically Signed   By: Lowella Grip III M.D.   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 She was noted to have significant constitutional symptoms including significant weight loss, fevers chills and some night sweats. HIV negative Hepatitis C and hepatitis B serologies negative. Echo with normal ejection fraction. PET/CT scan after 3 cycles of AVD had shown near complete metabolic response of the nodal disease within the neck chest and abdomen with only minimal persistent hypermetabolic lymphadenopathy in the retroperitoneum.  Patient has completed cycle 6day 1 AVD (bleomycin on hold due to lung compromise) DLCO on her pulmonary function tests are 37 percent suggesting severe reduction. And was still actively smoking. Patient has had multiple treatment delays especially in  her fifth cycle due to avoidable reasons including transportation issues. Thus far had not had any complications from treatment. Was receiving G-CSF support with her AVD due to issues with initial recurrent pneumonias.  Her last chemotherapy was on 10/19/2016 and she missed her cycle 6 day 15 on 11/16/2016.   Was recently admitted to the hospital in the 1st week of dec 2017 with DOE and SOB and new productive cough and imaging had shown dense extensive right lung consolidation and a few left lung infiltrates. Was evaluated by pulmonary and  Had a bronchoscopy with biopsy that was consistent with progression of her Classical Hodgkins lymphoma.  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  #3 Active smoker  #4 Significant issues with non compliance with oncologic followup and treatment delays. We setup patient for urgent post-hospitalization followup on 2-3 occasions with multiple calls for followup and she still did not show up in clinic for further treatment.  Plan -discussed goals of care in details and her significant non compliance with oncology clinic followup with multiple missed clinic visits despite multiple phone calls. -she is clearly aware that she has a life threatening condition that will need very close and compliant followup if she hope to cure/control this. -agree with high dose steroid currently -discussed with radiation oncology -- they plan to proceed with palliative RT to help manage patient rt lung collapse due to airway obstruction and SVC syndrome. -I discussed treatment option of Bendamustine + Brentuximab-Vedotin with patient and she noted that would like to proceed and informed consent was obtained. We will try to see if we could give her the first dose as inpatient (depending on pharmacy and nursing input if patient is still in the hospital over the weekend into next week) alternatively will need to be setup as outpatient. Orders placed and signed. -smoking cessation -will arrange referral to [facility] for auto HSCT consideration if patient agreeable and once stable.  She will need followup with [place] (Dr Irene Limbo) in 1 week after hospital discharge. I shall be out of town and oncall oncologist shall be covering from tomorrow.   I spent 40 minutes counseling the patient face to face. The total time spent in the appointment was 40 minutes and more than 50% was on counseling and direct patient cares.    Sullivan Lone MD [place] AAHIVMS [facility] Hematology/Oncology Physician [facility]  (Office):       [phone] (Work cell):  [phone] (Fax):           [phone]  12/15/2016 9:32 AM

## 2016-12-15 NOTE — Progress Notes (Signed)
Heidi Blinks, NP notified regarding pt HR of 145-150 bpm with HR spikes up to 160. Pt admission EKG showed ST with rate of 123 bpm. Pt currently at rest, coughing intermittently. Pt afebrile. Will continue to monitor.

## 2016-12-15 NOTE — Progress Notes (Signed)
Pharmacy Antibiotic Note  Heidi [name] is a 30 y.o. female with hx of Hodgkin's lymphoma presents with increasing dyspnea recent admission for pulmonary infection, but left AMA  admitted on 12/14/2016 with pneumonia.  Pharmacy has been consulted for vancomycin dosing.  Plan: Cefepime 1Gm IV q8h (MD) Vancomycin 750 mg IV q8h  VT=15-20 mg/L  Height: 5\' 7"  (170.2 cm) Weight: 164 lb 3.9 oz (74.5 kg) IBW/kg (Calculated) : 61.6  Temp (24hrs), Avg:98.7 F (37.1 C), Min:98.7 F (37.1 C), Max:98.7 F (37.1 C)   Recent Labs Lab 12/14/16 1946 12/14/16 2255  WBC 13.5*  --   CREATININE 0.50  --   LATICACIDVEN  --  2.2*    Estimated Creatinine Clearance: 107.4 mL/min (by C-G formula based on SCr of 0.5 mg/dL).    No Known Allergies  Antimicrobials this admission: 12/19 cefepime >>  12/20 vancomycin >>   Dose adjustments this admission:   Microbiology results:  BCx:   UCx:    Sputum:    MRSA PCR:   Thank you for allowing pharmacy to be a part of this patient's care.  Dorrene German 12/15/2016 1:08 AM

## 2016-12-15 NOTE — Progress Notes (Signed)
Spoke to Dr. Myna Hidalgo, RN to administer Albumin 25g first. If pt HR still 150 bpm or greater following administration of Albumin, RN to administer 1L NS bolus, ordered by Lamar Blinks, NP. If pt HR less than 150 bpm and sustaining, RN not to administer 1L NS bolus. Dr. Myna Hidalgo made aware of 500 mL bolus administered for pt LA 2.2. Will continue to monitor.

## 2016-12-15 NOTE — H&P (Signed)
History and Physical    Heidi [name] Heidi [name] DOB: 1986/04/17 DOA: 12/14/2016  PCP: Maren Reamer, MD   Patient coming from: Home  Chief Complaint: Dyspnea; swelling of right face, RUE, RLE  HPI: Heidi [name] is a 30 y.o. female with medical history significant for Hodgkin lymphoma, presenting to the emergency department with 1 week of progressive dyspnea and right sided edema. Patient is under the care of oncology and has undergone chemotherapy, but unfortunately has shown up for some infusions. She has marked adenopathy and biopsy proven lung involvement.  She had admitted to the hospital approximately 3 weeks ago for acute hypoxic respiratory failure suspected secondary to CAP. She left AGAINST MEDICAL ADVICE on 12/04/2016 and, while she initially did okay back at home, she has developed recurrence in severe dyspnea over the past week. She denies any chest pain or palpitations, but notes dyspnea at rest and with minimal exertion. She reports a cough productive of thick clear and white sputum, but denies any fevers or chills. Over the past one week, she has also noted swelling involving the right face and right arm. This has continued to progress and she denies any prior experience with similar symptoms.  ED Course: Upon arrival to the ED, patient is found to be afebrile, saturating 92% on room air, tachycardic in the 120s, and with vitals otherwise stable. EKG features a sinus tachycardia with rate 123 and chest x-ray is notable for complete opacification of the right hemithorax, likely diffuse consolidation. Also noted on chest x-ray is Port-A-Cath tip coiled within the right IJ and persistent nodular density within the left mid lung, possibly representing infectious, but with malignancy not excluded. Chemistry panel is notable for serum albumin of 1.8 and CBC features a leukocytosis to 13,500, stable normocytic anemia with hemoglobin of 11.5, and marked thrombocytosis with  platelets 879,000. Blood cultures were obtained in the emergency department, but no interventions were taken. Patient remained dyspneic and tachycardic and in obvious discomfort. 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  Review of Systems:  All other systems reviewed and apart from HPI, are negative.  Past Medical History:  Diagnosis Date  . ARDS (adult respiratory distress syndrome) ([place])   . Asthma   . Hodgkin lymphoma ([place])   . Hypertension     Past Surgical History:  Procedure Laterality Date  . AXILLARY LYMPH NODE BIOPSY Right 03/19/2016   Procedure: AXILLARY LYMPH NODE BIOPSY;  Surgeon: Armandina Gemma, MD;  Location: [facility];  Service: General;  Laterality: Right;  [name] Kitchen VIDEO BRONCHOSCOPY Bilateral 11/26/2016   Procedure: VIDEO BRONCHOSCOPY WITH FLUORO;  Surgeon: Rigoberto Noel, MD;  Location: [facility];  Service: Cardiopulmonary;  Laterality: Bilateral;     reports that she quit smoking about 8 months ago. Her smoking use included Cigarettes. She has a 7.50 pack-year smoking history. She has never used smokeless tobacco. She reports that she drinks alcohol. Her drug history is not on file.  No Known Allergies  History reviewed. No pertinent family history.   Prior to Admission medications   Medication Sig Start Date End Date Taking? Authorizing Provider  dexamethasone (DECADRON) 4 MG tablet 2 tab (8mg ) twice a day for 3 days starting on day of chemotherapy 06/15/16  Yes Gautam Juleen China, MD  levalbuterol [facility] HFA) 45 MCG/ACT inhaler Inhale 2  puffs into the lungs every 6 (six) hours as needed for wheezing. 03/23/16  Yes Erick Colace, NP  lidocaine-prilocaine (EMLA) cream Apply 1 application topically as needed (numbing).    Yes Historical  Provider, MD  loratadine (CLARITIN) 10 MG tablet Take 1 tablet (10 mg total) by mouth daily. For up to 5 days following Neulasta injection. 06/15/16  Yes Brunetta Genera, MD  ondansetron (ZOFRAN) 8 MG tablet Take 1 tablet (8 mg total) by mouth every 8 (eight) hours as needed for nausea (start on the 3rd day after chemotherapy). 05/03/16  Yes Gautam Juleen China, MD  prochlorperazine (COMPAZINE) 10 MG tablet Take 1 tablet (10 mg total) by mouth every 6 (six) hours as needed for nausea or vomiting. 05/03/16  Yes Gautam Juleen China, MD  metoprolol (LOPRESSOR) 50 MG tablet Take 1 tablet (50 mg total) by mouth 2 (two) times daily. Patient not taking: Reported on 11/23/2016 04/26/16   Verlee Monte, MD    Physical Exam: Vitals:   12/14/16 1859 12/14/16 1900 12/14/16 2112 12/14/16 2211  BP: 137/92  144/93 135/98  Pulse: (!) 121  (!) 130 (!) 126  Resp: (!) 28  23 17   Temp:      TempSrc:      SpO2: 93%  96% 95%  Weight:  65.8 kg (145 lb)    Height:  5\' 7"  (1.702 m)        Constitutional: NAD, calm, comfortable, right facial swelling Eyes: PERTLA, lids and conjunctivae normal ENMT: Mucous membranes are moist. Posterior pharynx clear of any exudate or lesions.   Neck: normal, supple, no masses, no thyromegaly Respiratory: Breath sounds diminished to absent over right chest, with coarse rhonchi at left left base. Normal respiratory effort.    Cardiovascular: Rate ~120 and regular. LUE edematous. Neck veins are distended. Abdomen: No distension, no tenderness. Bowel sounds normal.  Musculoskeletal: no clubbing / cyanosis. No joint deformity upper and lower extremities. Normal muscle tone.  Skin: Hyperpigmented papules about the extremities. Warm, dry, well-perfused. Neurologic: CN 2-12 grossly intact. Sensation intact, DTR normal. Strength 5/5 in all 4 limbs.  Psychiatric: Normal judgment and insight. Alert and oriented x 3. Normal mood and affect.     Labs on Admission: I have personally  reviewed following labs and imaging studies  CBC:  Recent Labs Lab 12/14/16 1946  WBC 13.5*  HGB 11.5*  HCT 35.8*  MCV 92.0  PLT 0000000*   Basic Metabolic Panel:  Recent Labs Lab 12/14/16 1946  NA 136  K 4.1  CL 95*  CO2 34*  GLUCOSE 117*  BUN 7  CREATININE 0.50  CALCIUM 7.8*   GFR: Estimated Creatinine Clearance: 99.1 mL/min (by C-G formula based on SCr of 0.5 mg/dL). Liver Function Tests:  Recent Labs Lab 12/14/16 1946  AST 18  ALT 12*  ALKPHOS 116  BILITOT 0.7  PROT 4.6*  ALBUMIN 1.8*   No results for input(s): LIPASE, AMYLASE in the last 168 hours. No results for input(s): AMMONIA in the last 168 hours. Coagulation Profile: No results for input(s): INR, PROTIME in the last 168 hours. Cardiac Enzymes: No results for input(s): CKTOTAL, CKMB, CKMBINDEX, TROPONINI in the last 168 hours. BNP (last 3 results) No results for input(s): PROBNP in the last 8760 hours. HbA1C: No results for input(s): HGBA1C in the last 72 hours. CBG: No results for input(s): GLUCAP in the last 168 hours. Lipid Profile: No results for input(s): CHOL, HDL, LDLCALC, TRIG, CHOLHDL, LDLDIRECT in the last 72 hours. Thyroid  Function Tests: No results for input(s): TSH, T4TOTAL, FREET4, T3FREE, THYROIDAB in the last 72 hours. Anemia Panel: No results for input(s): VITAMINB12, FOLATE, FERRITIN, TIBC, IRON, RETICCTPCT in the last 72 hours. Urine analysis:    Component Value Date/Time   COLORURINE AMBER BIOCHEMICALS MAY BE AFFECTED BY COLOR (A) 08/01/2009 1305   APPEARANCEUR CLEAR 08/01/2009 1305   LABSPEC 1.029 08/01/2009 1305   PHURINE 6.5 08/01/2009 1305   GLUCOSEU NEGATIVE 08/01/2009 1305   HGBUR NEGATIVE 08/01/2009 1305   BILIRUBINUR SMALL (A) 08/01/2009 1305   KETONESUR 15 (A) 08/01/2009 1305   PROTEINUR NEGATIVE 08/01/2009 1305   UROBILINOGEN 1.0 08/01/2009 1305   NITRITE NEGATIVE 08/01/2009 1305   LEUKOCYTESUR SMALL (A) 08/01/2009 1305   Sepsis  Labs: @LABRCNTIP (procalcitonin:4,lacticidven:4) )No results found for this or any previous visit (from the past 240 hour(s)).   Radiological Exams on Admission: Dg Chest 2 View  Result Date: 12/14/2016 CLINICAL DATA:  Shortness of breath for 1 week. History of Hodgkin's lymphoma. EXAM: CHEST  2 VIEW COMPARISON:  11/26/2016 and 11/23/2016 FINDINGS: There is now complete opacification of the right hemithorax. Based on the previous CT, this is probably related to consolidation. Pleural fluid cannot be excluded. Again noted are patchy densities in the mid left lung which could be infectious but worrisome for a neoplastic process. Heart size is grossly stable. Trachea is mildly deviated towards the left. Patient is a right jugular Port-A-Cath with the tip in the right innominate vein region. The catheter tip has pulled back since the original placement images and there is a small loop within the right internal jugular vein. IMPRESSION: Complete opacification of the right hemithorax. Based on the previous chest CT, this probably represents diffuse consolidation. Pleural fluid cannot be excluded. Persistent nodular density in the mid left lung. This has minimally changed since 11/26/2016. Although this could be infectious, neoplastic process cannot be excluded in the left lung. The right jugular Port-A-Cath tip remains in the right innominate vein. However, a portion of the tube is coiled in the right internal jugular vein which is not ideal and consider removal and/or repositioning. Electronically Signed   By: Markus Daft M.D.   On: 12/14/2016 19:39    EKG: Independently reviewed. Sinus tachycardia (rate 123)  Assessment/Plan  1. Acute hypoxic respiratory failure - Pt presents with dyspnea, saturating only 82% on room air while at rest  - CXR reveals complete opacification of right hemithorax, similar to recent imaging  - She meets sepsis criteria and is started on empiric abx as below, but her neoplastic  disease likely also contributing - She underwent bronch with cultures and biopsy during recent admission; bronch biopsy path consistent with classic Hodgkin lymphoma  - Saturations normalized with 4 Lpm via nasal canula  - Treatment as below, supportive care  2. HCAP - There is leukocytosis, tachycardia, elevated lactate, and diffuse consolidation of right lung  - Much of this could possibly be attributed to her cancer, but obstructive PNA difficult to exclude  - She was recently treated for CAP as inpatient and went home with Levaquin on 12/04/16  - Blood cultures obtained in ED, sputum culture and gram stain requested  - Micro data from recent admit reviewed and only positive was CNS from 1 of 2 bottles and felt to be contaminant; MRSA screen   - Start empiric vancomycin and cefepime   3. Hodgkin lymphoma  - Pt has been under the care of oncology and treated with chemotherapy  - Ongoing management per oncology  4. Unilateral swelling  - Pt presents with 1 wk of edema in right face and right arm  - Given the hx, there is suspicion for SVC syndrome  - If tumor burden is causing this, chemo may be first-line treatment  - There is concern that this could be related to the port in right chest with line coiled in RIJ  - Discussed with radiology, will try to identify etiology with contrast-enhanced CT, but timing of contrast for SVC filling said to be difficult  - Follow-up CT findings, may need anticoagulation and port pulled vs chemo   5. Hypoalbuminemia  - Serum albumin 1.8 on admission  - Given the tachycardia and edema, she was treated with albumin infusion    6. Normocytic anemia, thrombocytosis  - Hgb 11.5 on admission and stable relative to priors  - Platelets 879,000 on admisison, liekly secondary to lymphoma  - No evidence for bleeding on admission     DVT prophylaxis: sq Lovenox  Code Status: Full  Family Communication: Discussed with patient Disposition Plan: Admit to  stepdown Consults called: None Admission status: Inpatient    Vianne Bulls, MD [facility] Pager [phone]  If 7PM-7AM, please contact night-coverage [email or web address] Password [email or web address]  12/14/2016, 10:31 PM

## 2016-12-15 NOTE — Progress Notes (Signed)
PROGRESS NOTE    Heidi [name]  [id number] DOB: 02-Jun-1986 DOA: 12/14/2016 PCP: Maren Reamer, MD   Brief Narrative: 30 y.o. female with medical history significant for Hodgkin lymphoma, presenting to the emergency department with 1 week of progressive dyspnea and right sided edema. She had admitted to the hospital approximately 3 weeks ago for acute hypoxic respiratory failure suspected secondary to CAP. She left AGAINST MEDICAL ADVICE on 12/04/2016 and, while she initially did okay back at home, she has developed recurrence in severe dyspnea over the past week.EKG features a sinus tachycardia with rate 123 and chest x-ray is notable for complete opacification of the right hemithorax, likely diffuse consolidation. 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 C/o: Dyspnea; swelling of right face, RUE, RLE (on admission).  Assessment & Plan:  #Acute hypoxic respiratory failure most likely due to progression of lymphoma. Less likely healthcare associated pneumonia. -CT chest showed right lung complete opacity and increase in the site of left lung nodules represent neoplastic disease versus pneumonia. -Continue IV vancomycin and cefepime. Follow up culture results -Started on Solu-Medrol 60 mg every 6 hours by pulmonologist. -Oncology and radiation oncology was consulted this morning because of worsening lymphoma and impending superior vena cava syndrome -Continue bronchodilators and breathing treatment. -Monitor leukocytosis.  #Hodgkins lymphoma: On Solu-Medrol. Follow-up medicine oncology and rotation oncologist evaluation and recommendation.  #Possible pneumonia, healthcare associated pneumonia: Continue antibiotics as discussed above. Follow-up cultures.  #Right-sided unilateral swelling concerning for SVC syndrome secondary due to worsening lymphoma and adenopathy: Patient may benefit from urgent radiation therapy. Follow up oncology and radiation treatment. -CT scan of chest showed normal  opacification of right brachiocephalic vein and right IJ, follow-up with Hem/Onc if patient needs any anticoagulation treatment. Discussed with the pulmonologist.  #Cody status: Patient is full code. We'll follow-up oncology and radiation oncologist evaluation. Patient may benefit from palliative care service.  #Sinus tachycardia: Likely due to pneumonia, albuterol and distress. I will start gentle IV fluid. Monitor and telemetric.  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 I discussed with the patient regarding importance of Lovenox and she agreed.  Code Status:Full code   Family Communication:No family present at bedside  Disposition Plan:Likely discharge to home in 3-4 days. Remains in ICU step down today  Consultants:   [place] oncologist  Radiation therapy.  Procedures:None  Antimicrobials:IV vancomycin and cefepime since December 19  Subjective:Patient was seen and examined at bedside. Patient was asking if she can eat today. Reported fatigue and mild shortness of breath today. Denied chest pain, fever, chills, headache, dizziness, abdominal pain, dysuria, urgency.   Objective: Vitals:   12/15/16 0744 12/15/16 0800 12/15/16 0836 12/15/16 1039  BP: (!) 168/86   (!) 162/82  Pulse: (!) 139   (!) 133  Resp: (!) 31   (!) 28  Temp:  97.8 F (36.6 C)    TempSrc:  Oral    SpO2: 97%  95% 94%  Weight:      Height:        Intake/Output Summary (Last 24 hours) at 12/15/16 1239 Last data filed at 12/15/16 0800  Gross per 24 hour  Intake  1100 ml  Output              552 ml  Net              548 ml   Filed Weights   12/14/16 1900 12/14/16  2255  Weight: 65.8 kg (145 lb) 74.5 kg (164 lb 3.9 oz)    Examination:  General exam: Not in distress, calm comfortable right face swelling  Eyes: PERRLA  Respiratory system:Decreased breath sound on the right side, mildly tachypneic. Clear on left side.  Cardiovascular system: S1 & S2 heard,regular tachycardic.  No pedal edema. Gastrointestinal system: Abdomen is nondistended, soft and nontender. Normal bowel sounds heard. Central nervous system: Alert and oriented. No focal neurological deficits. Extremities: Symmetric 5 x 5 power. Skin: No rashes, lesions or ulcers Psychiatry: Judgement and insight appear normal. Mood & affect appropriate.     Data Reviewed: I have personally reviewed following labs and imaging studies  CBC:  Recent Labs Lab 12/14/16 1946 12/15/16 0221  WBC 13.5* 14.0*  NEUTROABS  --  12.3*  HGB 11.5* 11.1*  HCT 35.8* 34.1*  MCV 92.0 92.4  PLT 879* [id number]*   Basic Metabolic Panel:  Recent Labs Lab 12/14/16 1946 12/15/16 0221  NA 136 135  K 4.1 4.1  CL 95* 95*  CO2 34* 33*  GLUCOSE 117* 118*  BUN 7 7  CREATININE 0.50 0.42*  CALCIUM 7.8* 7.9*   GFR: Estimated Creatinine Clearance: 107.4 mL/min (by C-G formula based on SCr of 0.42 mg/dL (L)). Liver Function Tests:  Recent Labs Lab 12/14/16 1946 12/15/16 0221  AST 18 15  ALT 12* 12*  ALKPHOS 116 101  BILITOT 0.7 0.6  PROT 4.6* 5.0*  ALBUMIN 1.8* 2.3*   No results for input(s): LIPASE, AMYLASE in the last 168 hours. No results for input(s): AMMONIA in the last 168 hours. Coagulation Profile: No results for input(s): INR, PROTIME in the last 168 hours. Cardiac Enzymes: No results for input(s): CKTOTAL, CKMB, CKMBINDEX, TROPONINI in the last 168 hours. BNP (last 3 results) No results for input(s): PROBNP in the last 8760 hours. HbA1C: No results for input(s): HGBA1C in the last 72 hours. CBG: No results for input(s): GLUCAP in the last 168 hours. Lipid Profile: No results for  input(s): CHOL, HDL, LDLCALC, TRIG, CHOLHDL, LDLDIRECT in the last 72 hours. Thyroid Function Tests: No results for input(s): TSH, T4TOTAL, FREET4, T3FREE, THYROIDAB in the last 72 hours. Anemia Panel: No results for input(s): VITAMINB12, FOLATE, FERRITIN, TIBC, IRON, RETICCTPCT in the last 72 hours. Sepsis Labs:  Recent Labs Lab 12/14/16 2255 12/15/16 0221  LATICACIDVEN 2.2* 1.6    Recent Results (from the past 240 hour(s))  Culture, blood (Routine X 2) w Reflex to ID Panel     Status: None (Preliminary result)   Collection Time: 12/14/16  7:45 PM  Result Value Ref Range Status   Specimen Description BLOOD LEFT WRIST  Final   Special Requests BOTTLES DRAWN AEROBIC AND ANAEROBIC 5CC  Final   Culture   Final    NO GROWTH < 24 HOURS Performed at [facility]    Report Status PENDING  Incomplete  MRSA PCR Screening     Status: None   Collection Time: 12/14/16 10:52 PM  Result Value Ref Range Status   MRSA by PCR NEGATIVE NEGATIVE Final    Comment:        The GeneXpert MRSA Assay (FDA approved for NASAL specimens only), is one component of a comprehensive MRSA  colonization surveillance program. It is not intended to diagnose MRSA infection nor to guide or monitor treatment for MRSA infections.   Culture, sputum-assessment     Status: None   Collection Time: 12/15/16 12:50 AM  Result Value Ref Range Status   Specimen Description SPU  Final   Special Requests Immunocompromised  Final   Sputum evaluation THIS SPECIMEN IS ACCEPTABLE FOR SPUTUM CULTURE  Final   Report Status 12/15/2016 FINAL  Final         Radiology Studies: Dg Chest 2 View  Result Date: 12/14/2016 CLINICAL DATA:  Shortness of breath for 1 week. History of Hodgkin's lymphoma. EXAM: CHEST  2 VIEW COMPARISON:  11/26/2016 and 11/23/2016 FINDINGS: There is now complete opacification of the right hemithorax. Based on the previous CT, this is probably related to consolidation. Pleural fluid cannot be  excluded. Again noted are patchy densities in the mid left lung which could be infectious but worrisome for a neoplastic process. Heart size is grossly stable. Trachea is mildly deviated towards the left. Patient is a right jugular Port-A-Cath with the tip in the right innominate vein region. The catheter tip has pulled back since the original placement images and there is a small loop within the right internal jugular vein. IMPRESSION: Complete opacification of the right hemithorax. Based on the previous chest CT, this probably represents diffuse consolidation. Pleural fluid cannot be excluded. Persistent nodular density in the mid left lung. This has minimally changed since 11/26/2016. Although this could be infectious, neoplastic process cannot be excluded in the left lung. The right jugular Port-A-Cath tip remains in the right innominate vein. However, a portion of the tube is coiled in the right internal jugular vein which is not ideal and consider removal and/or repositioning. Electronically Signed   By: Markus Daft M.D.   On: 12/14/2016 19:39   Ct Chest W Contrast  Result Date: 12/15/2016 CLINICAL DATA:  30 year old female with lung mass and right chest and arm and breast swelling. 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 EXAM: CT CHEST WITH CONTRAST TECHNIQUE: Multidetector CT imaging of the chest was performed during intravenous contrast administration. CONTRAST:  75 cc [id number] COMPARISON:  Chest radiograph dated 12/14/2016 and CT dated 11/23/2016 FINDINGS: Cardiovascular: There is no cardiomegaly or pericardial effusion. There is mild shift of the heart and mediastinum into the left hemithorax secondary to mass effect caused by right lung mass/consolidation. The thoracic aorta appears unremarkable. The origins of the great vessels of the aortic arch appear patent. There is dilatation of the main pulmonary trunk indicative of underlying pulmonary hypertension. Evaluation for pulmonary embolism is very limited due  to suboptimal opacification of the vasculature and respiratory motion artifact. No filling defect identified within the pulmonary trunk or central portion of the main pulmonary arteries. Right pectoral Port-A-Cath again noted which extends up into the right IJ. The catheter then turns down with tip in the right brachiocephalic vein in similar appearance as the prior CT. There is apparent occlusion of the right brachiocephalic vein as well as a degree of occlusion or stricture of the right IJ. There is non opacification of the right subclavian vein. Mediastinum/Nodes: Anterior mediastinal mass/ lymphadenopathy measures 2.9 x 3.6 cm (previously approximately 2.1 x 3.9 cm). Right paratracheal adenopathy measures up to 16 mm in short axis (previously 13 mm). Subcarinal adenopathy measures 17 mm in short axis grossly similar prior study. The esophagus is not well visualized. Lungs/Pleura: There has been interval progression of right lung consolidation now involving the entire right lung.  There is compression and occlusion of the distal portion of the right mainstem bronchus. There is overall increased right lung volume with mass effect and mild displacement of the mediastinum into the left hemithorax. Multiple nodular densities in the left lung predominantly involving the left upper lobe have increased in size since the prior CT. The largest nodule or confluence of nodules measures approximately 17 x 15 mm (previously approximately 10 x 8 mm). These may be infectious in etiology or represent metastatic disease. There is a small right pleural effusion. No pleural effusion noted on the left. There is no pneumothorax. Upper Abdomen: There is retroperitoneal adenopathy in the upper abdomen. Partially visualized perihepatic ascites. Musculoskeletal: Diffuse subcutaneous edema and anasarca. No drainable fluid collection. IMPRESSION: Interval progression of right lung mass with complete opacification of the right lung. Interval  increase in the size of the left lung nodules which may represent progression of pneumonia or neoplastic disease. Clinical correlation is recommended. Non opacification of the right brachiocephalic vein and right IJ concerning for occlusion of these vessels. Progression of mediastinal adenopathy. Upper abdominal retroperitoneal adenopathy. Small right pleural effusion, partially visualized ascites, diffuse subcutaneous edema and anasarca. Electronically Signed   By: Anner Crete M.D.   On: 12/15/2016 06:39        Scheduled Meds: . ceFEPime (MAXIPIME) IV  1 g Intravenous Q8H  . enoxaparin (LOVENOX) injection  40 mg Subcutaneous QHS  . iopamidol      . levalbuterol  0.63 mg Nebulization Q6H  . mouth rinse  15 mL Mouth Rinse BID  . methylPREDNISolone (SOLU-MEDROL) injection  60 mg Intravenous Q6H  . pantoprazole  40 mg Oral Daily  . sodium chloride  1,000 mL Intravenous Once  . sodium chloride flush  3 mL Intravenous Q12H  . vancomycin  750 mg Intravenous Q8H   Continuous Infusions:   LOS: 1 day    Dron Tanna Furry, MD [facility] Pager [phone]  If 7PM-7AM, please contact night-coverage [email or web address] Password [facility] 12/15/2016, 12:39 PM

## 2016-12-16 ENCOUNTER — Ambulatory Visit
Admit: 2016-12-16 | Discharge: 2016-12-16 | Disposition: A | Discharge status: Home / Self Care | Payer: Medicaid Other | Attending: Radiation Oncology | Admitting: Radiation Oncology

## 2016-12-16 DIAGNOSIS — R918 Other nonspecific abnormal finding of lung field: Secondary | ICD-10-CM

## 2016-12-16 DIAGNOSIS — J189 Pneumonia, unspecified organism: Secondary | ICD-10-CM

## 2016-12-16 MED ORDER — DIPHENHYDRAMINE HCL 25 MG PO CAPS
25.0000 mg | ORAL_CAPSULE | Freq: Four times a day (QID) | ORAL | Status: DC | PRN
  Administered 2016-12-16 – 2016-12-23 (×14): 25 mg via ORAL
  Filled 2016-12-16 (×15): qty 1

## 2016-12-16 MED ORDER — ENOXAPARIN SODIUM 80 MG/0.8ML ~~LOC~~ SOLN
1.0000 mg/kg | Freq: Once | SUBCUTANEOUS | Status: AC
  Administered 2016-12-16: 75 mg via SUBCUTANEOUS
  Filled 2016-12-16: qty 0.8

## 2016-12-16 MED ORDER — METOPROLOL TARTRATE 25 MG PO TABS
100.0000 mg | ORAL_TABLET | Freq: Two times a day (BID) | ORAL | Status: DC
  Administered 2016-12-16 – 2016-12-17 (×3): 100 mg via ORAL
  Filled 2016-12-16 (×3): qty 4

## 2016-12-16 MED ORDER — ENOXAPARIN SODIUM 80 MG/0.8ML ~~LOC~~ SOLN
1.0000 mg/kg | SUBCUTANEOUS | Status: AC
  Administered 2016-12-16: 13:00:00 75 mg via SUBCUTANEOUS
  Filled 2016-12-16: qty 0.8

## 2016-12-16 MED ORDER — ENOXAPARIN SODIUM 80 MG/0.8ML ~~LOC~~ SOLN
1.0000 mg/kg | Freq: Two times a day (BID) | SUBCUTANEOUS | Status: DC
  Administered 2016-12-17 – 2016-12-19 (×5): 75 mg via SUBCUTANEOUS
  Filled 2016-12-16 (×5): qty 0.8

## 2016-12-16 NOTE — Consult Note (Signed)
Radiation Oncology         [phone] ________________________________  Initial inpatient Consultation  Name: Heidi [name] MRN: [id number]  Date: 12/15/16  DOB: [birth year]  [id number] T Janne Napoleon, MD  No ref. provider found   REFERRING PHYSICIAN: No ref. provider found  DIAGNOSIS: The primary encounter diagnosis was Dyspnea, unspecified type. Diagnoses of SVC syndrome, Lung mass, and Central line complication were also pertinent to this visit.    ICD-9-CM ICD-10-CM   1. 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 Lung mass 786.6 R91.8 CT CHEST W CONTRAST     CT CHEST W CONTRAST  4. Central line complication [id number] CT CHEST W CONTRAST     CT CHEST W CONTRAST    HISTORY OF PRESENT ILLNESS: Heidi [name] is a 30 y.o. female seen at the request of Dr. Irene Limbo for a history of progressive Hodgkin's lymphoma. Apparently the patient has been diagnosed with his disease for some time, and has received chemotherapy in the past which gave her near complete response. In the last month, she has been noticing progressive swelling in her right upper extremity as well as increasing shortness of breath. She was hospitalized approximately 1 month ago and at that point in time centering the options for treating a large tumor compressing her right lung fields however at the time she also had pneumonia, and the plan was to move forward with systemic therapy to shrink her disease and radiation was postponed. She left AGAINST MEDICAL ADVICE on 12/04/2016 She came to the hospital however on 12/14/2016 complaining of increasing shortness of breath and edema of her right face and right upper extremity. Additional imaging obtained in the emergency department on 12/14/2016 revealed widespread consolidation in the right lung, and stable infiltrate in the left upper lobe. A repeat chest was performed today, and reveals anterior  mediastinal adenopathy measuring 2.9 x 3.6 cm which previously been 2.1 x 3.9 cm a few weeks prior, right peritracheal adenopathy measuring up to 16 mm, subcarinal adenopathy measuring 17 mm, and progression of the right lung consolidation involving the entire lung. Compression and occlusion of the distal portion of the right mainstem bronchus was noted, as well as nonopacification of the right received brachiocephalic vein and right IJ concerning for occlusion and impending SVC syndrome we are asked to see her for palliative radiotherapy in this setting.  PREVIOUS RADIATION THERAPY: No  PAST MEDICAL HISTORY:  Past Medical History:  Diagnosis Date  . ARDS (adult respiratory distress syndrome) ([place])   . Asthma   . Hodgkin lymphoma ([place])   . Hypertension       PAST SURGICAL HISTORY: Past Surgical History:  Procedure Laterality Date  . AXILLARY LYMPH NODE BIOPSY Right 03/19/2016   Procedure: AXILLARY LYMPH NODE BIOPSY;  Surgeon: Armandina Gemma, MD;  Location: [facility];  Service: General;  Laterality: Right;  [name] Kitchen VIDEO BRONCHOSCOPY Bilateral 11/26/2016   Procedure: VIDEO BRONCHOSCOPY WITH FLUORO;  Surgeon: Rigoberto Noel, MD;  Location: [facility];  Service: Cardiopulmonary;  Laterality: Bilateral;    FAMILY HISTORY: No family history of malignancy  SOCIAL HISTORY:  Social History   Social History  . Marital status: Single    Spouse name: N/A  . Number of children: N/A  . Years of education: N/A   Occupational History  . Not on file.   Social History Main Topics  . Smoking status: Former  Smoker    Packs/day: 0.50    Years: 15.00    Types: Cigarettes    Quit date: 03/27/2016  . Smokeless tobacco: Never Used  . Alcohol use Yes     Comment: occasional now  . Drug use: Unknown  . Sexual activity: Not on file   Other Topics Concern  . Not on file   Social History Narrative  . No narrative on file    ALLERGIES: Patient has no known allergies.  MEDICATIONS:  Current  Facility-Administered Medications  Medication Dose Route Frequency Provider Last Rate Last Dose  . 0.9 %  sodium chloride infusion  250 mL Intravenous PRN Ilene Qua Opyd, MD      . 0.9 %  sodium chloride infusion   Intravenous Continuous Dron Tanna Furry, MD 75 mL/hr at 12/16/16 0600    . ceFEPIme (MAXIPIME) 1 g in dextrose 5 % 50 mL IVPB  1 g Intravenous Q8H Vianne Bulls, MD   1 g at 12/16/16 0533  . enoxaparin (LOVENOX) injection 40 mg  40 mg Subcutaneous QHS Vianne Bulls, MD   40 mg at 12/15/16 2152  . guaiFENesin (ROBITUSSIN) 100 MG/5ML solution 100 mg  5 mL Oral Q4H PRN Vianne Bulls, MD   100 mg at 12/16/16 0023  . hydrALAZINE (APRESOLINE) injection 10 mg  10 mg Intravenous Q8H PRN Dron Tanna Furry, MD   10 mg at 12/16/16 0534  . HYDROcodone-acetaminophen (NORCO/VICODIN) 5-325 MG per tablet 1-2 tablet  1-2 tablet Oral Q4H PRN Ilene Qua Opyd, MD      . iopamidol (ISOVUE-300) 61 % injection 75 mL  75 mL Intravenous Once PRN Vianne Bulls, MD      . levalbuterol Penne Lash) nebulizer solution 0.63 mg  0.63 mg Nebulization Q6H Chesley Mires, MD   0.63 mg at 12/16/16 0201  . LORazepam (ATIVAN) injection 1 mg  1 mg Intravenous Q4H PRN Vianne Bulls, MD   1 mg at 12/16/16 0344  . MEDLINE mouth rinse  15 mL Mouth Rinse BID Ilene Qua Opyd, MD   15 mL at 12/15/16 2200  . methylPREDNISolone sodium succinate (SOLU-MEDROL) 125 mg/2 mL injection 60 mg  60 mg Intravenous Q6H Chesley Mires, MD   60 mg at 12/16/16 0534  . metoprolol tartrate (LOPRESSOR) tablet 50 mg  50 mg Oral BID Dron Tanna Furry, MD   50 mg at 12/15/16 1711  . morphine 2 MG/ML injection 2 mg  2 mg Intravenous Q4H PRN Vianne Bulls, MD      . pantoprazole (PROTONIX) EC tablet 40 mg  40 mg Oral Daily Chesley Mires, MD   40 mg at 12/15/16 0914  . sodium chloride 0.9 % bolus 1,000 mL  1,000 mL Intravenous Once Jeryl Columbia, NP      . sodium chloride flush (NS) 0.9 % injection 3 mL  3 mL Intravenous Q12H Vianne Bulls, MD   3  mL at 12/15/16 2130  . sodium chloride flush (NS) 0.9 % injection 3 mL  3 mL Intravenous PRN Vianne Bulls, MD      . vancomycin (VANCOCIN) IVPB 750 mg/150 ml premix  750 mg Intravenous Q8H Dorrene German, RPH   750 mg at 12/15/16 2302    REVIEW OF SYSTEMS:  On review of systems, the patient reports thatShe is feeling better with oxygen therapy that she had at home. She denies any pain in her right extremity but has noticed swelling in this area for the last  week and a half. Sh currently denies any chest pain,  cough, fevers, chills, night sweats, unintended weight changes. She reports that she does have difficulty with   shortness of breath with even slight movement while in the hospital bed. She denies any bowel or bladder disturbances, and denies abdominal pain, nausea or vomiting. She denies any new musculoskeletal or joint aches or pains. A complete review of systems is obtained and is otherwise negative.    PHYSICAL EXAM:  Wt Readings from Last 3 Encounters:  12/14/16 164 lb 3.9 oz (74.5 kg)  11/27/16 145 lb 3.2 oz (65.9 kg)  10/19/16 144 lb (65.3 kg)   Temp Readings from Last 3 Encounters:  12/16/16 99.1 F (37.3 C) (Oral)  11/27/16 98.6 F (37 C) (Oral)  10/19/16 99.5 F (37.5 C) (Oral)   BP Readings from Last 3 Encounters:  12/16/16 (!) 170/100  11/27/16 131/79  10/19/16 136/87   Pulse Readings from Last 3 Encounters:  12/16/16 (!) 112  11/27/16 (!) 108  10/19/16 (!) 110    Pain Scale 0/10 In general this is a Chronically ill appearing African-American female in no acute distress saturating in the mid 90 percentile on 3 L nasal cannula. She is alert and oriented x4 and somewhat juvenile in her discussion throughout the examination. As an example, she is more concerned about taking Popsicle during her discussion and actively engaging in the discussion. HEENT reveals that the patient is normocephalic, atraumatic. EOMs are intact. PERRLA. Skin is intact without any  evidence of gross lesions. Cardiovascular exam reveals a tachycardic rate and normal rhythm, no clicks rubs or murmurs are auscultated. Chest is clear to auscultation in the left field, but reveals quite coarse breath sounds throughout the right lung fields.  Abdomen has active bowel sounds in all quadrants and is intact. The abdomen is soft, non tender, non distended. her right upper extremity is notable for 1+ pitting edema which extends from the dorsal surface of her hand, all the way to the supraclavicular region. No edema is noted on the left upper extremity.  Lower extremities are negative for pretibial pitting edema, deep calf tenderness, cyanosis or clubbing.   KPS = 20  100 - Normal; no complaints; no evidence of disease. 90   - Able to carry on normal activity; minor signs or symptoms of disease. 80   - Normal activity with effort; some signs or symptoms of disease. 65   - Cares for self; unable to carry on normal activity or to do active work. 60   - Requires occasional assistance, but is able to care for most of his personal needs. 50   - Requires considerable assistance and frequent medical care. 63   - Disabled; requires special care and assistance. 45   - Severely disabled; hospital admission is indicated although death not imminent. 96   - Very sick; hospital admission necessary; active supportive treatment necessary. 10   - Moribund; fatal processes progressing rapidly. 0     - Dead  Karnofsky DA, Abelmann [address], Craver LS and Burchenal [facility] [phone]) The use of the nitrogen mustards in the palliative treatment of carcinoma: with particular reference to bronchogenic carcinoma Cancer 1 634-56  LABORATORY DATA:  Lab Results  Component Value Date   WBC 14.0 (H) 12/15/2016   HGB 11.1 (L) 12/15/2016   HCT 34.1 (L) 12/15/2016   MCV 92.4 12/15/2016   PLT 815 (H) 12/15/2016   Lab Results  Component Value Date   NA 135 12/15/2016  K 4.1 12/15/2016   CL 95 (L) 12/15/2016   CO2 33 (H)  12/15/2016   Lab Results  Component Value Date   ALT 12 (L) 12/15/2016   AST 15 12/15/2016   ALKPHOS 101 12/15/2016   BILITOT 0.6 12/15/2016     RADIOGRAPHY: Dg Chest 2 View  Result Date: 12/14/2016 CLINICAL DATA:  Shortness of breath for 1 week. History of Hodgkin's lymphoma. EXAM: CHEST  2 VIEW COMPARISON:  11/26/2016 and 11/23/2016 FINDINGS: There is now complete opacification of the right hemithorax. Based on the previous CT, this is probably related to consolidation. Pleural fluid cannot be excluded. Again noted are patchy densities in the mid left lung which could be infectious but worrisome for a neoplastic process. Heart size is grossly stable. Trachea is mildly deviated towards the left. Patient is a right jugular Port-A-Cath with the tip in the right innominate vein region. The catheter tip has pulled back since the original placement images and there is a small loop within the right internal jugular vein. IMPRESSION: Complete opacification of the right hemithorax. Based on the previous chest CT, this probably represents diffuse consolidation. Pleural fluid cannot be excluded. Persistent nodular density in the mid left lung. This has minimally changed since 11/26/2016. Although this could be infectious, neoplastic process cannot be excluded in the left lung. The right jugular Port-A-Cath tip remains in the right innominate vein. However, a portion of the tube is coiled in the right internal jugular vein which is not ideal and consider removal and/or repositioning. Electronically Signed   By: Markus Daft M.D.   On: 12/14/2016 19:39   Dg Chest 2 View  Result Date: 11/23/2016 CLINICAL DATA:  Worsening coughing congestion over the past week. History of Hodgkin's lymphoma. EXAM: CHEST  2 VIEW COMPARISON:  PET-CT 07/22/2016.  Chest radiographs 04/21/2016. FINDINGS: Right jugular Port-A-Cath terminates in the region of the bright brachiocephalic vein as on the prior PET-CT. The cardiac silhouette  again appears mildly enlarged, though the right heart border is obscured. There is mild patchy opacity in the left mid lung which has greatly improved from the 03/2016 chest radiographs and is similar to the more recent PET-CT. There is extensive consolidation throughout much of the right lung which is similar to the prior radiographs but substantially worsened compared to the more recent PET-CT. An underlying right pleural effusion is not excluded. No pneumothorax. No acute osseous abnormality. IMPRESSION: Extensive right lung consolidation, increased from 06/2016. Electronically Signed   By: Logan Bores M.D.   On: 11/23/2016 09:36   Ct Chest W Contrast  Result Date: 12/15/2016 CLINICAL DATA:  30 year old female with lung mass and right chest and arm and breast swelling. 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 EXAM: CT CHEST WITH CONTRAST TECHNIQUE: Multidetector CT imaging of the chest was performed during intravenous contrast administration. CONTRAST:  75 cc Isovue-300 COMPARISON:  Chest radiograph dated 12/14/2016 and CT dated 11/23/2016 FINDINGS: Cardiovascular: There is no cardiomegaly or pericardial effusion. There is mild shift of the heart and mediastinum into the left hemithorax secondary to mass effect caused by right lung mass/consolidation. The thoracic aorta appears unremarkable. The origins of the great vessels of the aortic arch appear patent. There is dilatation of the main pulmonary trunk indicative of underlying pulmonary hypertension. Evaluation for pulmonary embolism is very limited due to suboptimal opacification of the vasculature and respiratory motion artifact. No filling defect identified within the pulmonary trunk or central portion of the main pulmonary arteries. Right pectoral Port-A-Cath again noted which extends  up into the right IJ. The catheter then turns down with tip in the right brachiocephalic vein in similar appearance as the prior CT. There is apparent occlusion of the right  brachiocephalic vein as well as a degree of occlusion or stricture of the right IJ. There is non opacification of the right subclavian vein. Mediastinum/Nodes: Anterior mediastinal mass/ lymphadenopathy measures 2.9 x 3.6 cm (previously approximately 2.1 x 3.9 cm). Right paratracheal adenopathy measures up to 16 mm in short axis (previously 13 mm). Subcarinal adenopathy measures 17 mm in short axis grossly similar prior study. The esophagus is not well visualized. Lungs/Pleura: There has been interval progression of right lung consolidation now involving the entire right lung. There is compression and occlusion of the distal portion of the right mainstem bronchus. There is overall increased right lung volume with mass effect and mild displacement of the mediastinum into the left hemithorax. Multiple nodular densities in the left lung predominantly involving the left upper lobe have increased in size since the prior CT. The largest nodule or confluence of nodules measures approximately 17 x 15 mm (previously approximately 10 x 8 mm). These may be infectious in etiology or represent metastatic disease. There is a small right pleural effusion. No pleural effusion noted on the left. There is no pneumothorax. Upper Abdomen: There is retroperitoneal adenopathy in the upper abdomen. Partially visualized perihepatic ascites. Musculoskeletal: Diffuse subcutaneous edema and anasarca. No drainable fluid collection. IMPRESSION: Interval progression of right lung mass with complete opacification of the right lung. Interval increase in the size of the left lung nodules which may represent progression of pneumonia or neoplastic disease. Clinical correlation is recommended. Non opacification of the right brachiocephalic vein and right IJ concerning for occlusion of these vessels. Progression of mediastinal adenopathy. Upper abdominal retroperitoneal adenopathy. Small right pleural effusion, partially visualized ascites, diffuse  subcutaneous edema and anasarca. Electronically Signed   By: Anner Crete M.D.   On: 12/15/2016 06:39   Ct Angio Chest Pe W/cm &/or Wo Cm  Result Date: 11/23/2016 CLINICAL DATA:  Cough.  History of Hodgkin's lymphoma. EXAM: CT ANGIOGRAPHY CHEST WITH CONTRAST TECHNIQUE: Multidetector CT imaging of the chest was performed using the standard protocol during bolus administration of intravenous contrast. Multiplanar CT image reconstructions and MIPs were obtained to evaluate the vascular anatomy. CONTRAST:  100 mL of Isovue 370 intravenously. PET scan of July 22, 2016. COMPARISON:  CT scan of April 10, 2016. FINDINGS: Cardiovascular: There is no evidence of thoracic aortic dissection or aneurysm. There is no definite evidence of pulmonary embolus. Mediastinum/Nodes: Mediastinal adenopathy is noted consistent with a history of lymphoma. Right internal jugular Port-A-Cath is noted which is looped within right internal jugular vein more superiorly, in distal tip is located in right brachiocephalic vein. Lungs/Pleura: Multifocal opacities are noted in the left lung which may represent pneumonia. There is nearly complete opacification of the right lung which is significantly worse compared to prior PET scan, most consistent with pneumonia. No pneumothorax is noted. Upper Abdomen: No definite abnormality seen in visualized portion of upper abdomen. Musculoskeletal: No significant osseous abnormality is noted. Review of the MIP images confirms the above findings. IMPRESSION: No definite evidence of pulmonary embolus. Stable mediastinal adenopathy consistent with history of lymphoma. Multiple small opacities are noted throughout the left lung concerning for pneumonia. There is nearly complete opacification of right lung most likely representing pneumonia, although infiltrative disease by lymphoma cannot be excluded. Electronically Signed   By: Marijo Conception, M.D.   On:  11/23/2016 11:40   Dg Chest Port 1  View  Result Date: 11/26/2016 CLINICAL DATA:  Status post bronchoscopy and right lower lobe biopsy. History of Hodgkin's lymphoma EXAM: PORTABLE CHEST 1 VIEW COMPARISON:  November 23, 2016 chest CT and chest radiograph FINDINGS: No pneumothorax. Consolidation throughout most of the right lung remains. There is patchy infiltrate in the left upper lobe. No new opacity evident compared to recent studies. Heart size is within normal limits. Pulmonary vascularity is obscured on the right and grossly normal on the left. Port-A-Cath tip is in the superior vena cava, unchanged. Right paratracheal adenopathy is stable. IMPRESSION: No pneumothorax. Widespread consolidation on the right and patchy infiltrate left upper lobe remain stable. Stable cardiac silhouette. There is right paratracheal adenopathy. Stable Port-A-Cath position. Electronically Signed   By: Lowella Grip III M.D.   On: 11/26/2016 08:26      IMPRESSION/PLAN: 1. 30 y.o. woman with Hodgkin's lymphoma resulting in right lung collapse, respiratory distress and impending SVC syndrome. The patient's condition is quite severe, and after discussing her case, and confirming with Dr. Rod Can, she has been noncompliant with recommendations. Initially after a discussion when she was last admitted was to try and administer chemotherapy is the primary form of treatment, however after failing to follow up visit, Dr. Rod Can feels that the best modality for treating her with now be to move forward with radiotherapy. We treated utility could be great to protect her airway and prevent intubation, as well as improve her breathing and quality of life in the immediate future. The patient is also going to be presented options for antibody therapy all in the hospital per Dr. Rod Can. We will move forward with simulation today in our department and begin treatment Thursday, December 21,2017 in the morning. We anticipate a course of 5 fractions, and the patient has been  counseled on the risks, benefits, short and long-term effects of treatment, risks of not proceeding with this treatment, and has signed written consent to move forward. 2. CODE STATUS. Patient remains a full code.   In a visit lasting 70 minutes, greater than 50% of the time was spent face to face discussing her diagnosis and the role for radiotherapy, and insular time discussing her care with her oncologist, and coordinating the patient's care.   The above documentation reflects my direct findings during this shared patient visit. Please see the separate note by Dr. Tammi Klippel on this date for the remainder of the patient's plan of care.   [name], PAC

## 2016-12-16 NOTE — Progress Notes (Signed)
ANTICOAGULATION CONSULT NOTE - Initial Consult  Pharmacy Consult for Lovenox Indication: VTE treatment  No Known Allergies  Patient Measurements: Height: 5\' 7"  (170.2 cm) Weight: 164 lb 3.9 oz (74.5 kg) IBW/kg (Calculated) : 61.6 Heparin Dosing Weight:   Vital Signs: Temp: 97.8 F (36.6 C) (12/21 0800) Temp Source: Axillary (12/21 0800) BP: 159/92 (12/21 0901) Pulse Rate: 141 (12/21 0901)  Labs:  Recent Labs  12/14/16 1946 12/15/16 0221  HGB 11.5* 11.1*  HCT 35.8* 34.1*  PLT 879* 815*  CREATININE 0.50 0.42*    Estimated Creatinine Clearance: 107.4 mL/min (by C-G formula based on SCr of 0.42 mg/dL (L)).   Medical History: Past Medical History:  Diagnosis Date  . ARDS (adult respiratory distress syndrome) ([place])   . Asthma   . Hodgkin lymphoma ([place])   . Hypertension     Assessment: 37 yoF admitted with respiratory failure 2/2 progressive Hodgkins lymphoma with lung involvement.  Pt also found to have possible right brachiocephalic vein DVT.  MD and Oncologist recommend full dose lovenox for now with possible consideration of oral anticoagulation at follow-up visit outpatient.  Pharmacy consulted to assist with dosing.   Last dose lovenox 40mg  q24h on 12/20 @ 2200.   CrCl >100 ml/min CBC:  Hgb low, stable, plts elevated.  No anticoagulation noted PTA  Goal of Therapy:  Anti-Xa level 0.6-1 units/ml 4hrs after LMWH dose given Monitor platelets by anticoagulation protocol: Yes   Plan:  Lovenox 1mg /kg q12h F/u if/when ok to change to q24h dosing F/u daily CBC, renal function F/u signs / symptoms of bleeding  Ralene Bathe, PharmD, BCPS 12/16/2016, 11:56 AM  Pager: [phone]

## 2016-12-16 NOTE — Progress Notes (Signed)
PCCM Consult Note  Admission date: 12/14/2016 Consult date: 12/15/2016 Referring provider: Dr. Carolin Sicks, [facility]  CC: Short of breath  HPI: 30 yo female was admitted in March 2017 with respiratory failure.  She was found to have Hodgkin's lymphoma with lung involvement in April 2017.  She has been followed by oncology.  She has been on tx with AVD w/o bleomycin due to lung involvement with low DLCO 37%.  She had difficulty following through with treatment sessions due to issues with transportation.  She was admitted at end of November 2017 with dyspnea with concerns for pneumonia.  She had repeat bronchoscopy on 11/26/16 which showed Hodgkin's lymphoma, and cultures were negative.  She was discharged 11/27/16 at patient's request.  It does not appear that patient followed up with oncology.    She returned to ER on 12/14/16 with progressive dyspnea and right chest/arm/breast swelling.  CXR showed progression of right lung consolidative changes.  She was hypoxia and required supplemental oxygen.  She was started on broad spectrum antibiotics.  She had sinus tachycardia and no response to fluid challenges.  She reports her breathing is okay at rest, but she has trouble if she moves.  She is not having chest pain.  She has dry cough.  She denies headache or difficulty with her vision.  She is not having trouble with her swallowing.   SUBJECTIVE:  No acute events overnight. Patient planned for XRT today. Continues to have a good appetite. Patient extremely somnolent this morning after receiving IV Ativan. Does wake up transiently to tell me her name and that she is in hospital.  REVIEW OF SYSTEMS:  Unable to obtain given altered mentation status post Ativan IV.  Vital signs: BP (!) 159/92   Pulse (!) 141   Temp 97.8 F (36.6 C) (Axillary)   Resp (!) 21   Ht 5\' 7"  (1.702 m)   Wt 164 lb 3.9 oz (74.5 kg)   LMP 10/13/2016 (Approximate) Comment: patient was shielded  SpO2 96%   BMI 25.72 kg/m    Intake/output: I/O last 3 completed shifts: In: 3629.3 [P.O.:1070; I.V.:1209.3; IV Piggyback:1350] Out: 552 [Urine:551; Stool:1]  General:  Sleeping & very somnolent after awakening. No acute distress. No family at bedside.  Integument:  Warm & dry. No rash on exposed skin.  HEENT:  Moist mucus membranes. No scleral injection or icterus. Cardiovascular:  Tachycardic with regular rhythm. No edema. Facial/neck fullness noted. Pulmonary:  Mildly increased work of breathing on nasal cannula. Bronchial breath sounds right hemithorax anteriorly. Abdomen: Soft. Normal bowel sounds. Nondistended.  Musculoskeletal:  Normal bulk and tone. No joint deformity or effusion appreciated.   CMP Latest Ref Rng & Units 12/15/2016 12/14/2016 11/23/2016  Glucose 65 - 99 mg/dL 118(H) 117(H) 115(H)  BUN 6 - 20 mg/dL 7 7 <5(L)  Creatinine 0.44 - 1.00 mg/dL 0.42(L) 0.50 0.60  Sodium 135 - 145 mmol/L 135 136 132(L)  Potassium 3.5 - 5.1 mmol/L 4.1 4.1 3.8  Chloride 101 - 111 mmol/L 95(L) 95(L) 96(L)  CO2 22 - 32 mmol/L 33(H) 34(H) 28  Calcium 8.9 - 10.3 mg/dL 7.9(L) 7.8(L) 7.8(L)  Total Protein 6.5 - 8.1 g/dL 5.0(L) 4.6(L) 5.0(L)  Total Bilirubin 0.3 - 1.2 mg/dL 0.6 0.7 0.6  Alkaline Phos 38 - 126 U/L 101 116 59  AST 15 - 41 U/L 15 18 19   ALT 14 - 54 U/L 12(L) 12(L) 9(L)    CBC Latest Ref Rng & Units 12/15/2016 12/14/2016 11/23/2016  WBC 4.0 - 10.5 K/uL  14.0(H) 13.5(H) 11.9(H)  Hemoglobin 12.0 - 15.0 g/dL 11.1(L) 11.5(L) 11.8(L)  Hematocrit 36.0 - 46.0 % 34.1(L) 35.8(L) 35.9(L)  Platelets 150 - 400 K/uL 815(H) 879(H) 608(H)    ABG    Component Value Date/Time   PHART 7.375 04/18/2016 0934   PCO2ART 69.8 (HH) 04/18/2016 0934   PO2ART 60.2 (L) 04/18/2016 0934   HCO3 39.9 (H) 04/18/2016 0934   TCO2 37.0 04/18/2016 0934   O2SAT 87.2 04/18/2016 0934    CBG (last 3)  No results for input(s): GLUCAP in the last 72 hours.   Imaging: Dg Chest 2 View  Result Date: 12/14/2016 CLINICAL DATA:   Shortness of breath for 1 week. History of Hodgkin's lymphoma. EXAM: CHEST  2 VIEW COMPARISON:  11/26/2016 and 11/23/2016 FINDINGS: There is now complete opacification of the right hemithorax. Based on the previous CT, this is probably related to consolidation. Pleural fluid cannot be excluded. Again noted are patchy densities in the mid left lung which could be infectious but worrisome for a neoplastic process. Heart size is grossly stable. Trachea is mildly deviated towards the left. Patient is a right jugular Port-A-Cath with the tip in the right innominate vein region. The catheter tip has pulled back since the original placement images and there is a small loop within the right internal jugular vein. IMPRESSION: Complete opacification of the right hemithorax. Based on the previous chest CT, this probably represents diffuse consolidation. Pleural fluid cannot be excluded. Persistent nodular density in the mid left lung. This has minimally changed since 11/26/2016. Although this could be infectious, neoplastic process cannot be excluded in the left lung. The right jugular Port-A-Cath tip remains in the right innominate vein. However, a portion of the tube is coiled in the right internal jugular vein which is not ideal and consider removal and/or repositioning. Electronically Signed   By: Markus Daft M.D.   On: 12/14/2016 19:39   Ct Chest W Contrast  Result Date: 12/15/2016 CLINICAL DATA:  30 year old female with lung mass and right chest and arm and breast swelling. 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 EXAM: CT CHEST WITH CONTRAST TECHNIQUE: Multidetector CT imaging of the chest was performed during intravenous contrast administration. CONTRAST:  75 cc Isovue-300 COMPARISON:  Chest radiograph dated 12/14/2016 and CT dated 11/23/2016 FINDINGS: Cardiovascular: There is no cardiomegaly or pericardial effusion. There is mild shift of the heart and mediastinum into the left hemithorax secondary to mass effect caused by right  lung mass/consolidation. The thoracic aorta appears unremarkable. The origins of the great vessels of the aortic arch appear patent. There is dilatation of the main pulmonary trunk indicative of underlying pulmonary hypertension. Evaluation for pulmonary embolism is very limited due to suboptimal opacification of the vasculature and respiratory motion artifact. No filling defect identified within the pulmonary trunk or central portion of the main pulmonary arteries. Right pectoral Port-A-Cath again noted which extends up into the right IJ. The catheter then turns down with tip in the right brachiocephalic vein in similar appearance as the prior CT. There is apparent occlusion of the right brachiocephalic vein as well as a degree of occlusion or stricture of the right IJ. There is non opacification of the right subclavian vein. Mediastinum/Nodes: Anterior mediastinal mass/ lymphadenopathy measures 2.9 x 3.6 cm (previously approximately 2.1 x 3.9 cm). Right paratracheal adenopathy measures up to 16 mm in short axis (previously 13 mm). Subcarinal adenopathy measures 17 mm in short axis grossly similar prior study. The esophagus is not well visualized. Lungs/Pleura: There has been interval  progression of right lung consolidation now involving the entire right lung. There is compression and occlusion of the distal portion of the right mainstem bronchus. There is overall increased right lung volume with mass effect and mild displacement of the mediastinum into the left hemithorax. Multiple nodular densities in the left lung predominantly involving the left upper lobe have increased in size since the prior CT. The largest nodule or confluence of nodules measures approximately 17 x 15 mm (previously approximately 10 x 8 mm). These may be infectious in etiology or represent metastatic disease. There is a small right pleural effusion. No pleural effusion noted on the left. There is no pneumothorax. Upper Abdomen: There is  retroperitoneal adenopathy in the upper abdomen. Partially visualized perihepatic ascites. Musculoskeletal: Diffuse subcutaneous edema and anasarca. No drainable fluid collection. IMPRESSION: Interval progression of right lung mass with complete opacification of the right lung. Interval increase in the size of the left lung nodules which may represent progression of pneumonia or neoplastic disease. Clinical correlation is recommended. Non opacification of the right brachiocephalic vein and right IJ concerning for occlusion of these vessels. Progression of mediastinal adenopathy. Upper abdominal retroperitoneal adenopathy. Small right pleural effusion, partially visualized ascites, diffuse subcutaneous edema and anasarca. Electronically Signed   By: Anner Crete M.D.   On: 12/15/2016 06:39    STUDIES: BRONCHOSCOPY 11/26/16: surgical path with Hodgkin's lymphoma, cultures negative CT CHEST W/ 12/15/16:  Personally reviewed by me. Complete opacification of right hemithorax. Pathologically enlarged mediastinal lymphadenopathy. Nodularity in left lung as well. Suggestion of SVC occlusion.   MICROBIOLOGY: BAL 12/1:  Normal respiratory flora / AFB pending / Fungus pending MRSA PCR 12/19:  Negative Blood Ctx x1 12/19 >> Blood Ctx x1 12/20 >> Sputum Ctx 12/20 >>  ANTIBIOTICS: Cefepime 12/19 >> Vancomycin 12/19 >>  LINES/TUBES: R Chest Port 05/17/16 >> PIV  EVENTS: 12/19 - admit 12/20 - Added solumedrol  ASSESSMENT/PLAN:  30 y.o. 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 Patient extremely somnolent this morning after IV Ativan. Does arouse briefly. Respiratory status overall seems to remain stable. Patient planned for radiation therapy today. Patient has been evaluated by medical oncology & radiation oncology. Continue to doubt the possibility of an underlying pulmonary infection. Cultures  remain negative today. Can likely begin to de-escalate antibiotic therapy in the next 24-48 hours.  1. Acute Hypoxic Respiratory Failure: Secondary to progression of lymphoma. Continuing supplemental oxygen. Continue close monitoring for possible further decompensation & need for intubation. 2. Hodgkin's Lymphoma: Continuing patient on sodium Medrol 60 mg IV every 6 hours. Radiation oncology and medical oncology following. 3. Possible Pneumonia: Doubtful. Cultures pending. Trending Procalcitonin per algorithm. Continuing empiric vancomycin & cefepime for now but low threshold to de-escalate if cultures are negative. 4. Asthma:  No signs of exacerbation. Continuing Xopenex nebulized every 6 hour. 5. Acute Encephalopathy:  Secondary to Ativan IV. Discontinued Ativan & recommend cautious use of sedating medications.  Remainder of care as per primary service. PCCM will be available as needed. Please notify me if we can be of any further assistance in the care of this patient.   Sonia Baller Ashok Cordia, M.D. [facility] Pager:  [phone] After 3pm or if no response, call [phone] 12/16/2016, 10:03 AM

## 2016-12-16 NOTE — Progress Notes (Signed)
RN informed by lab that pt is refusing morning lab draw. Will continue to monitor pt closely.

## 2016-12-16 NOTE — Progress Notes (Addendum)
PROGRESS NOTE    DAYLAN NAEF  [id number] DOB: 24-Sep-1986 DOA: 12/14/2016 PCP: Maren Reamer, MD   Brief Narrative: 30 y.o. female with medical history significant for Hodgkin lymphoma, presenting to the emergency department with 1 week of progressive dyspnea and right sided edema. She had admitted to the hospital approximately 3 weeks ago for acute hypoxic respiratory failure suspected secondary to CAP. She left AGAINST MEDICAL ADVICE on 12/04/2016 and, while she initially did okay back at home, she has developed recurrence in severe dyspnea over the past week.EKG features a sinus tachycardia with rate 123 and chest x-ray is notable for complete opacification of the right hemithorax, likely diffuse consolidation. 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 C/o: Dyspnea; swelling of right face, RUE, RLE (on admission).  Assessment & Plan:  #Acute hypoxic respiratory failure most likely due to progression of lymphoma. Less likely healthcare associated pneumonia. -CT chest showed right lung complete opacity and increase in the site of left lung nodules represent neoplastic disease versus pneumonia. -Currently on IV vancomycin and cefepime. Follow up culture results. If cultures are negative we may be able to de-escalate antibiotics 1. -Started on Solu-Medrol 60 mg every 6 hours by pulmonologist. Continue for now. Probably consult appreciated. -Oncology and radiation oncology consult service evaluated the patient. She is very noncompliant with the outpatient follow-up. Starting radiation therapy today.  -Continue bronchodilators and breathing treatment. -Monitor leukocytosis.  # Possible right brachiocephalic vein DVT: Reviewed the results of CT chest which was consistent with nonopacification of right upper brachiocephalic vein and right IJ concerning for occlusion. Unable to rule out if this is thrombus given history of malignancy. I discussed with Dr. Irene Limbo, patient's oncologist who recommended to start  therapeutic Lovenox for systemic anticoagulation. He reported that he may be able to switch to oral anticoagulation when he sees patient in the clinic.  #Hodgkins lymphoma: On Solu-Medrol IV. Follow-up medicine oncology and rotation oncologist evaluation and recommendation. Starting radiation therapy today.  #Possible pneumonia, healthcare associated pneumonia: Continue antibiotics as discussed above. Follow-up cultures.  #Right-sided unilateral swelling concerning for SVC syndrome secondary due to worsening lymphoma and adenopathy:  -Starting radiation therapy. Oncology and radiation oncology following.  # Hypertensive urgency and Sinus tachycardia: Increase the dose of Toprol 100 mg twice a day. Continue to monitor in the stepdown. Monitor blood pressure and heart rate closely. 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  Code Status:Full code   Family Communication:No family present at bedside  Disposition Plan:Likely discharge to home in 3-4 days. Remains in ICU step down today  Consultants:   [place] oncologist  Radiation therapy.  Procedures:None  Antimicrobials:IV vancomycin and cefepime since December 19  Subjective:Patient was seen and examined at bedside. Patient reported feeling good. Has good appetite. Denied headache, dizziness, nausea, vomiting, chest pain, shortness of breath. Denies abdominal pain. Objective: Vitals:   12/16/16 0600 12/16/16 0800 12/16/16 0900 12/16/16 0901  BP:   (!) 159/92 (!) 159/92  Pulse: (!) 112 (!) 144 (!) 138 (!) 141  Resp: (!) 21 (!) 30 (!) 33   Temp:  97.8 F (36.6 C)    TempSrc:  Axillary    SpO2: 96% 94% 97%  Weight:      Height:        Intake/Output Summary (Last 24 hours) at 12/16/16 1128 Last data filed at 12/16/16 1000  Gross per 24 hour  Intake          3222.25 ml  Output                0 ml  Net          3222.25 ml   Filed Weights   12/14/16 1900 12/14/16 2255  Weight: 65.8 kg (145 lb) 74.5 kg (164 lb 3.9 oz)    Examination:  General exam: Lying in bed, not in distress, right face swelling.  Eyes: PERRLA  Respiratory system: Decreased breath sound in the right side. Left-sided clear. No accessory muscle use for breathing.  Cardiovascular system: S1-S2 heard, regular tachycardic. No murmur. No bilateral pedal edema. Gastrointestinal system: Abdomen soft, nontender. Bowel sound positive. Central nervous system: Alert and oriented. No focal neurological deficits. Extremities: Symmetric 5 x 5 power. Skin: No rashes, lesions or ulcers Psychiatry: Judgement and insight appear normal. Mood & affect appropriate.     Data Reviewed: I have personally reviewed following labs and imaging studies  CBC:  Recent Labs Lab 12/14/16 1946 12/15/16 0221  WBC 13.5* 14.0*  NEUTROABS  --  12.3*  HGB 11.5* 11.1*  HCT 35.8* 34.1*  MCV 92.0 92.4  PLT 879* [id number]*   Basic Metabolic Panel:  Recent Labs Lab 12/14/16 1946 12/15/16 0221  NA 136 135  K 4.1 4.1  CL 95* 95*  CO2 34* 33*  GLUCOSE 117* 118*  BUN 7 7  CREATININE 0.50 0.42*  CALCIUM 7.8* 7.9*   GFR: Estimated Creatinine Clearance: 107.4 mL/min (by C-G formula based on SCr of 0.42 mg/dL (L)). Liver Function Tests:  Recent Labs Lab 12/14/16 1946 12/15/16 0221  AST 18 15  ALT 12* 12*  ALKPHOS 116 101  BILITOT 0.7 0.6  PROT 4.6* 5.0*  ALBUMIN 1.8* 2.3*   No results for input(s): LIPASE, AMYLASE in the last 168 hours. No results for input(s): AMMONIA in the last 168 hours. Coagulation Profile: No results for input(s): INR, PROTIME in the last 168 hours. Cardiac Enzymes: No results for input(s): CKTOTAL, CKMB,  CKMBINDEX, TROPONINI in the last 168 hours. BNP (last 3 results) No results for input(s): PROBNP in the last 8760 hours. HbA1C: No results for input(s): HGBA1C in the last 72 hours. CBG: No results for input(s): GLUCAP in the last 168 hours. Lipid Profile: No results for input(s): CHOL, HDL, LDLCALC, TRIG, CHOLHDL, LDLDIRECT in the last 72 hours. Thyroid Function Tests: No results for input(s): TSH, T4TOTAL, FREET4, T3FREE, THYROIDAB in the last 72 hours. Anemia Panel: No results for input(s): VITAMINB12, FOLATE, FERRITIN, TIBC, IRON, RETICCTPCT in the last 72 hours. Sepsis Labs:  Recent Labs Lab 12/14/16 2255 12/15/16 0221  LATICACIDVEN 2.2* 1.6    Recent Results (from the past 240 hour(s))  Culture, blood (Routine X 2) w Reflex to ID Panel     Status: None (Preliminary result)   Collection Time: 12/14/16  7:45 PM  Result Value Ref Range Status   Specimen Description BLOOD LEFT WRIST  Final   Special Requests BOTTLES DRAWN AEROBIC AND ANAEROBIC 5CC  Final   Culture   Final    NO GROWTH < 24 HOURS Performed at [facility]    Report Status PENDING  Incomplete  MRSA PCR Screening     Status: None   Collection Time: 12/14/16 10:52 PM  Result Value Ref Range Status   MRSA by PCR NEGATIVE NEGATIVE Final    Comment:        The GeneXpert MRSA Assay (FDA approved for NASAL specimens only), is one component of a comprehensive MRSA colonization surveillance program. It is not intended to diagnose MRSA infection nor to guide or monitor treatment for MRSA infections.   Culture, sputum-assessment     Status: None   Collection Time: 12/15/16 12:50 AM  Result Value Ref Range Status   Specimen Description SPU  Final   Special Requests Immunocompromised  Final   Sputum evaluation THIS SPECIMEN IS ACCEPTABLE FOR SPUTUM CULTURE  Final   Report Status 12/15/2016 FINAL  Final  Culture, respiratory (NON-Expectorated)     Status: None (Preliminary result)   Collection Time:  12/15/16 12:50 AM  Result Value Ref Range Status   Specimen Description SPU  Final   Special Requests Immunocompromised Reflexed from [id number]  Final   Gram Stain   Final    MODERATE WBC PRESENT, PREDOMINANTLY PMN FEW SQUAMOUS EPITHELIAL CELLS PRESENT FEW GRAM POSITIVE COCCI IN PAIRS FEW GRAM POSITIVE COCCOBACILLUS    Culture   Final    CULTURE REINCUBATED FOR BETTER GROWTH Performed at [facility]    Report Status PENDING  Incomplete         Radiology Studies: Dg Chest 2 View  Result Date: 12/14/2016 CLINICAL DATA:  Shortness of breath for 1 week. History of Hodgkin's lymphoma. EXAM: CHEST  2 VIEW COMPARISON:  11/26/2016 and 11/23/2016 FINDINGS: There is now complete opacification of the right hemithorax. Based on the previous CT, this is probably related to consolidation. Pleural fluid cannot be excluded. Again noted are patchy densities in the mid left lung which could be infectious but worrisome for a neoplastic process. Heart size is grossly stable. Trachea is mildly deviated towards the left. Patient is a right jugular Port-A-Cath with the tip in the right innominate vein region. The catheter tip has pulled back since the original placement images and there is a small loop within the right internal jugular vein. IMPRESSION: Complete opacification of the right hemithorax. Based on the previous chest CT, this probably represents diffuse consolidation. Pleural fluid cannot be excluded. Persistent nodular density in the mid left lung. This has minimally changed since 11/26/2016. Although this could be infectious, neoplastic process cannot be excluded in the left lung. The right jugular Port-A-Cath tip remains in the right innominate vein. However, a portion of the tube is coiled in the right internal jugular vein which is not ideal and consider removal and/or repositioning. Electronically Signed   By: Markus Daft M.D.   On: 12/14/2016 19:39   Ct Chest W Contrast  Result Date:  12/15/2016 CLINICAL DATA:  30 year old female with lung mass and right chest and arm and breast swelling. 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 EXAM: CT CHEST WITH CONTRAST TECHNIQUE: Multidetector CT imaging of the chest was performed during intravenous contrast administration. CONTRAST:  75 cc [id number] COMPARISON:  Chest radiograph dated 12/14/2016 and CT dated 11/23/2016 FINDINGS: Cardiovascular: There is no cardiomegaly or pericardial effusion. There is mild shift of the heart and mediastinum into the left hemithorax secondary to mass effect caused by right lung mass/consolidation. The thoracic aorta appears unremarkable. The origins of the great vessels of the aortic arch appear patent. There is dilatation of the main pulmonary trunk indicative of underlying pulmonary hypertension. Evaluation for pulmonary embolism is very limited due to suboptimal opacification of the vasculature and respiratory motion artifact. No  filling defect identified within the pulmonary trunk or central portion of the main pulmonary arteries. Right pectoral Port-A-Cath again noted which extends up into the right IJ. The catheter then turns down with tip in the right brachiocephalic vein in similar appearance as the prior CT. There is apparent occlusion of the right brachiocephalic vein as well as a degree of occlusion or stricture of the right IJ. There is non opacification of the right subclavian vein. Mediastinum/Nodes: Anterior mediastinal mass/ lymphadenopathy measures 2.9 x 3.6 cm (previously approximately 2.1 x 3.9 cm). Right paratracheal adenopathy measures up to 16 mm in short axis (previously 13 mm). Subcarinal adenopathy measures 17 mm in short axis grossly similar prior study. The esophagus is not well visualized. Lungs/Pleura: There has been interval progression of right lung consolidation now involving the entire right lung. There is compression and occlusion of the distal portion of the right mainstem bronchus. There is overall  increased right lung volume with mass effect and mild displacement of the mediastinum into the left hemithorax. Multiple nodular densities in the left lung predominantly involving the left upper lobe have increased in size since the prior CT. The largest nodule or confluence of nodules measures approximately 17 x 15 mm (previously approximately 10 x 8 mm). These may be infectious in etiology or represent metastatic disease. There is a small right pleural effusion. No pleural effusion noted on the left. There is no pneumothorax. Upper Abdomen: There is retroperitoneal adenopathy in the upper abdomen. Partially visualized perihepatic ascites. Musculoskeletal: Diffuse subcutaneous edema and anasarca. No drainable fluid collection. IMPRESSION: Interval progression of right lung mass with complete opacification of the right lung. Interval increase in the size of the left lung nodules which may represent progression of pneumonia or neoplastic disease. Clinical correlation is recommended. Non opacification of the right brachiocephalic vein and right IJ concerning for occlusion of these vessels. Progression of mediastinal adenopathy. Upper abdominal retroperitoneal adenopathy. Small right pleural effusion, partially visualized ascites, diffuse subcutaneous edema and anasarca. Electronically Signed   By: Anner Crete M.D.   On: 12/15/2016 06:39        Scheduled Meds: . ceFEPime (MAXIPIME) IV  1 g Intravenous Q8H  . enoxaparin (LOVENOX) injection  40 mg Subcutaneous QHS  . levalbuterol  0.63 mg Nebulization Q6H  . mouth rinse  15 mL Mouth Rinse BID  . methylPREDNISolone (SOLU-MEDROL) injection  60 mg Intravenous Q6H  . metoprolol tartrate  100 mg Oral BID  . pantoprazole  40 mg Oral Daily  . sodium chloride  1,000 mL Intravenous Once  . sodium chloride flush  3 mL Intravenous Q12H  . vancomycin  750 mg Intravenous Q8H   Continuous Infusions: . sodium chloride 75 mL/hr at 12/16/16 1000     LOS: 2  days    Jasamine Pottinger Tanna Furry, MD [facility] Pager [phone]  If 7PM-7AM, please contact night-coverage [email or web address] Password [facility] 12/16/2016, 11:28 AM

## 2016-12-16 NOTE — Progress Notes (Signed)
CM texted MD to make aware pt has Medicaid of [place] and lovenox is covered.  CM will continue to follow for progress and discharge needs.

## 2016-12-17 ENCOUNTER — Ambulatory Visit: Admit: 2016-12-17 | Discharge: 2016-12-17 | Disposition: A | Discharge status: Home / Self Care | Payer: Medicaid Other

## 2016-12-17 DIAGNOSIS — I82629 Acute embolism and thrombosis of deep veins of unspecified upper extremity: Secondary | ICD-10-CM

## 2016-12-17 LAB — CULTURE, RESPIRATORY

## 2016-12-17 LAB — CULTURE, RESPIRATORY W GRAM STAIN: Culture: NORMAL

## 2016-12-17 LAB — PROCALCITONIN

## 2016-12-17 LAB — LEGIONELLA PNEUMOPHILA SEROGP 1 UR AG: L. PNEUMOPHILA SEROGP 1 UR AG: NEGATIVE

## 2016-12-17 LAB — PROTIME-INR
INR: 1.12
PROTHROMBIN TIME: 14.5 s (ref 11.4–15.2)

## 2016-12-17 MED ORDER — LABETALOL HCL 200 MG PO TABS
200.0000 mg | ORAL_TABLET | Freq: Three times a day (TID) | ORAL | Status: DC
  Administered 2016-12-17 – 2016-12-23 (×19): 200 mg via ORAL
  Filled 2016-12-17 (×19): qty 1

## 2016-12-17 NOTE — Progress Notes (Signed)
PROGRESS NOTE    Heidi [name]  [id number] DOB: 1986/10/14 DOA: 12/14/2016 PCP: Maren Reamer, MD   Brief Narrative: 30 y.o. female with medical history significant for Hodgkin lymphoma, presenting to the emergency department with 1 week of progressive dyspnea and right sided edema. She had admitted to the hospital approximately 3 weeks ago for acute hypoxic respiratory failure suspected secondary to CAP. She left AGAINST MEDICAL ADVICE on 12/04/2016 and, while she initially did okay back at home, she has developed recurrence in severe dyspnea over the past week.EKG features a sinus tachycardia with rate 123 and chest x-ray is notable for complete opacification of the right hemithorax, likely diffuse consolidation. 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 C/o: Dyspnea; swelling of right face, RUE, RLE (on admission).  Assessment & Plan:  #Acute hypoxic respiratory failure most likely due to progression of lymphoma. Less likely healthcare associated pneumonia. -CT chest showed right lung complete opacity and increase in the site of left lung nodules represent neoplastic disease versus pneumonia. -Currently on IV vancomycin and cefepime. The cultures are negative to date. We may be able to de-escalate antibiotics on. -Started on Solu-Medrol 60 mg every 6 hours by pulmonologist. Continue for now.  -Oncology and radiation oncology consult service evaluated the patient. She is very noncompliant with the outpatient follow-up. Starting radiation therapy since 12/21. Follow-up there evaluation and recommendation.  -Continue bronchodilators and breathing treatment. Discussed with Dr. Ashok Cordia from pulmonology. -Monitor leukocytosis.  # Possible right brachiocephalic vein DVT: Reviewed the results of CT chest which was consistent with nonopacification of right upper brachiocephalic vein and right IJ concerning for occlusion. Unable to rule out if this is thrombus given history of malignancy. I discussed with Dr.  Irene Limbo on 12/21, patient's oncologist who recommended to start therapeutic Lovenox for systemic anticoagulation. He reported that he may be able to switch to oral anticoagulation when he sees patient in the clinic. -Started subcutaneous therapeutic Lovenox since December 21.  #Hodgkins lymphoma: On Solu-Medrol IV. Follow-up medicine oncology and rotation oncologist evaluation and recommendation. On radiation treatment today.  #Possible pneumonia, healthcare associated pneumonia: Continue antibiotics as discussed above. Follow-up cultures.  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 Treatment including radiation therapy as discussed above.  # Hypertensive urgency and Sinus tachycardia: Discontinue metoprolol and switched to labetalol 200 mg 3 times a day. Continue to monitor in the stepdown unit. Monitor blood pressure and heart rate. On hydralazine IV as needed. 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 Remains in ICU step down today  Consultants:   [facility] oncologist  Radiation therapy.  Procedures:None  Antimicrobials:IV vancomycin and cefepime since December 19  Subjective:Patient was seen and examined at bedside. No new event reported feeling good. Denied shortness of breath, chest pain, nausea or vomiting. Objective: Vitals:   12/17/16 0600 12/17/16 0749 12/17/16 1000 12/17/16 1300  BP:   (!) 171/92   Pulse: (!) 117  (!) 118   Resp: (!) 29       Temp:  98.9 F (37.2  C)  98.2 F (36.8 C)  TempSrc:  Oral  Oral  SpO2: 94%     Weight:      Height:        Intake/Output Summary (Last 24 hours) at 12/17/16 1349 Last data filed at 12/17/16 0600  Gross per 24 hour  Intake             1953 ml  Output                0 ml  Net             1953 ml   Filed Weights   12/14/16 1900 12/14/16 2255  Weight: 65.8 kg (145 lb) 74.5 kg (164 lb 3.9 oz)    Examination:  General exam: Not in distress, lying on bed, right face swelling. Eyes: PERRLA  Respiratory system: Decreased breath sound on the right side, left side clear. No accessory muscle use. Cardiovascular system: S1-S2 heard, regular tachycardic. No murmur. No bilateral pedal edema. Gastrointestinal system: Abdomen soft, nontender. Bowel sound positive. Central nervous system: Alert and oriented. No focal neurological deficits. Extremities: Symmetric 5 x 5 power. Skin: No rashes, lesions or ulcers Psychiatry: Judgement and insight appear normal. Mood & affect appropriate.     Data Reviewed: I have personally reviewed following labs and imaging studies  CBC:  Recent Labs Lab 12/14/16 1946 12/15/16 0221  WBC 13.5* 14.0*  NEUTROABS  --  12.3*  HGB 11.5* 11.1*  HCT 35.8* 34.1*  MCV 92.0 92.4  PLT 879* [id number]*   Basic Metabolic Panel:  Recent Labs Lab 12/14/16 1946 12/15/16 0221  NA 136 135  K 4.1 4.1  CL 95* 95*  CO2 34* 33*  GLUCOSE 117* 118*  BUN 7 7  CREATININE 0.50 0.42*  CALCIUM 7.8* 7.9*   GFR: Estimated Creatinine Clearance: 107.4 mL/min (by C-G formula based on SCr of 0.42 mg/dL (L)). Liver Function Tests:  Recent Labs Lab 12/14/16 1946 12/15/16 0221  AST 18 15  ALT 12* 12*  ALKPHOS 116 101  BILITOT 0.7 0.6  PROT 4.6* 5.0*  ALBUMIN 1.8* 2.3*   No results for input(s): LIPASE, AMYLASE in the last 168 hours. No results for input(s): AMMONIA in the last 168 hours. Coagulation Profile:  Recent Labs Lab 12/17/16 0355  INR 1.12   Cardiac  Enzymes: No results for input(s): CKTOTAL, CKMB, CKMBINDEX, TROPONINI in the last 168 hours. BNP (last 3 results) No results for input(s): PROBNP in the last 8760 hours. HbA1C: No results for input(s): HGBA1C in the last 72 hours. CBG: No results for input(s): GLUCAP in the last 168 hours. Lipid Profile: No results for input(s): CHOL, HDL, LDLCALC, TRIG, CHOLHDL, LDLDIRECT in the last 72 hours. Thyroid Function Tests: No results for input(s): TSH, T4TOTAL, FREET4, T3FREE, THYROIDAB in the last 72 hours. Anemia Panel: No results for input(s): VITAMINB12, FOLATE, FERRITIN, TIBC, IRON, RETICCTPCT in the last 72 hours. Sepsis Labs:  Recent Labs Lab 12/14/16 2255 12/15/16 0221 12/17/16 0355  PROCALCITON  --   --  <0.10  LATICACIDVEN 2.2* 1.6  --     Recent Results (from the past 240 hour(s))  Culture, blood (Routine X 2) w Reflex to ID Panel     Status: None (Preliminary result)   Collection Time: 12/14/16  7:45 PM  Result Value Ref Range Status   Specimen Description BLOOD LEFT WRIST  Final   Special Requests BOTTLES DRAWN AEROBIC AND ANAEROBIC 5CC  Final   Culture   Final  NO GROWTH 3 DAYS Performed at [facility]    Report Status PENDING  Incomplete  MRSA PCR Screening     Status: None   Collection Time: 12/14/16 10:52 PM  Result Value Ref Range Status   MRSA by PCR NEGATIVE NEGATIVE Final    Comment:        The GeneXpert MRSA Assay (FDA approved for NASAL specimens only), is one component of a comprehensive MRSA colonization surveillance program. It is not intended to diagnose MRSA infection nor to guide or monitor treatment for MRSA infections.   Culture, sputum-assessment     Status: None   Collection Time: 12/15/16 12:50 AM  Result Value Ref Range Status   Specimen Description SPU  Final   Special Requests Immunocompromised  Final   Sputum evaluation THIS SPECIMEN IS ACCEPTABLE FOR SPUTUM CULTURE  Final   Report Status 12/15/2016 FINAL  Final    Culture, respiratory (NON-Expectorated)     Status: None   Collection Time: 12/15/16 12:50 AM  Result Value Ref Range Status   Specimen Description SPU  Final   Special Requests Immunocompromised Reflexed from [id number]  Final   Gram Stain   Final    MODERATE WBC PRESENT, PREDOMINANTLY PMN FEW SQUAMOUS EPITHELIAL CELLS PRESENT FEW GRAM POSITIVE COCCI IN PAIRS FEW GRAM POSITIVE COCCOBACILLUS    Culture   Final    Consistent with normal respiratory flora. Performed at [facility]    Report Status 12/17/2016 FINAL  Final  Culture, blood (Routine X 2) w Reflex to ID Panel     Status: None (Preliminary result)   Collection Time: 12/15/16  2:25 AM  Result Value Ref Range Status   Specimen Description BLOOD RIGHT WRIST  Final   Special Requests BOTTLES DRAWN AEROBIC ONLY 5CC  Final   Culture   Final    NO GROWTH 2 DAYS Performed at [facility]    Report Status PENDING  Incomplete         Radiology Studies: No results found.      Scheduled Meds: . ceFEPime (MAXIPIME) IV  1 g Intravenous Q8H  . enoxaparin (LOVENOX) injection  1 mg/kg Subcutaneous Q12H  . levalbuterol  0.63 mg Nebulization Q6H  . mouth rinse  15 mL Mouth Rinse BID  . methylPREDNISolone (SOLU-MEDROL) injection  60 mg Intravenous Q6H  . metoprolol tartrate  100 mg Oral BID  . pantoprazole  40 mg Oral Daily  . sodium chloride  1,000 mL Intravenous Once  . sodium chloride flush  3 mL Intravenous Q12H  . vancomycin  750 mg Intravenous Q8H   Continuous Infusions: . sodium chloride 75 mL/hr at 12/17/16 0600     LOS: 3 days    Layan Zalenski Tanna Furry, MD [facility] Pager [phone]  If 7PM-7AM, please contact night-coverage [email or web address] Password [email or web address] 12/17/2016, 1:49 PM

## 2016-12-17 NOTE — Progress Notes (Signed)
Pt refused her 08:00 neb tx. No distress noted at this time. Pt stated she will call when needed.

## 2016-12-18 ENCOUNTER — Inpatient Hospital Stay (HOSPITAL_COMMUNITY): Payer: Medicaid Other

## 2016-12-18 DIAGNOSIS — T829XXA Unspecified complication of cardiac and vascular prosthetic device, implant and graft, initial encounter: Secondary | ICD-10-CM

## 2016-12-18 LAB — CBC
HCT: 32.7 % — ABNORMAL LOW (ref 36.0–46.0)
HEMOGLOBIN: 10.3 g/dL — AB (ref 12.0–15.0)
MCH: 29.5 pg (ref 26.0–34.0)
MCHC: 31.5 g/dL (ref 30.0–36.0)
MCV: 93.7 fL (ref 78.0–100.0)
Platelets: 676 10*3/uL — ABNORMAL HIGH (ref 150–400)
RBC: 3.49 MIL/uL — AB (ref 3.87–5.11)
RDW: 15.5 % (ref 11.5–15.5)
WBC: 18.1 10*3/uL — AB (ref 4.0–10.5)

## 2016-12-18 LAB — BASIC METABOLIC PANEL
Anion gap: 6 (ref 5–15)
BUN: 9 mg/dL (ref 6–20)
CHLORIDE: 97 mmol/L — AB (ref 101–111)
CO2: 35 mmol/L — ABNORMAL HIGH (ref 22–32)
Calcium: 8.3 mg/dL — ABNORMAL LOW (ref 8.9–10.3)
Creatinine, Ser: 0.43 mg/dL — ABNORMAL LOW (ref 0.44–1.00)
GFR calc non Af Amer: >60 mL/min (ref >60)
Glucose, Bld: 127 mg/dL — ABNORMAL HIGH (ref 65–99)
POTASSIUM: 4.4 mmol/L (ref 3.5–5.1)
SODIUM: 138 mmol/L (ref 135–145)

## 2016-12-18 LAB — PROTIME-INR
INR: 1.19
Prothrombin Time: 15.1 seconds (ref 11.4–15.2)

## 2016-12-18 LAB — GLUCOSE, CAPILLARY: Glucose-Capillary: 172 mg/dL — ABNORMAL HIGH (ref 65–99)

## 2016-12-18 LAB — VANCOMYCIN, TROUGH: Vancomycin Tr: 9 ug/mL — ABNORMAL LOW (ref 15–20)

## 2016-12-18 MED ORDER — LEVALBUTEROL HCL 0.63 MG/3ML IN NEBU
0.6300 mg | INHALATION_SOLUTION | Freq: Four times a day (QID) | RESPIRATORY_TRACT | Status: DC | PRN
  Administered 2016-12-18: 0.63 mg via RESPIRATORY_TRACT
  Filled 2016-12-18: qty 3

## 2016-12-18 MED ORDER — VANCOMYCIN HCL IN DEXTROSE 1-5 GM/200ML-% IV SOLN
1000.0000 mg | Freq: Three times a day (TID) | INTRAVENOUS | Status: DC
  Administered 2016-12-18 – 2016-12-19 (×3): 1000 mg via INTRAVENOUS
  Filled 2016-12-18 (×3): qty 200

## 2016-12-18 NOTE — Progress Notes (Signed)
Pharmacy Antibiotic Note  Heidi [name] is a 30 y.o. female with hx of Hodgkin's lymphoma presented to the ED on 12/14/16 with c/o SOB.  Vancomycin and cefepime were started for suspected PNA.  Today, 12/18/2016: - day #4 abx - afeb, wbc up (but on steroids) -  scr low 0.43 (crcl~100) - per Dr. Carolin Sicks, to continue with vancomycin for now   Plan: - continue Cefepime 1g IV q8h (MD) - continue Vancomycin 750mg  IV q8h --> will check vancomycin trough level with evening dose today to assess current regimen  _______________________________  Height: 5\' 7"  (170.2 cm) Weight: 164 lb 3.9 oz (74.5 kg) IBW/kg (Calculated) : 61.6  Temp (24hrs), Avg:98.4 F (36.9 C), Min:97.7 F (36.5 C), Max:98.7 F (37.1 C)   Recent Labs Lab 12/14/16 1946 12/14/16 2255 12/15/16 0221 12/18/16 0423  WBC 13.5*  --  14.0* 18.1*  CREATININE 0.50  --  0.42* 0.43*  LATICACIDVEN  --  2.2* 1.6  --     Estimated Creatinine Clearance: 107.4 mL/min (by C-G formula based on SCr of 0.43 mg/dL (L)).    No Known Allergies  Antimicrobials this admission:  12/20 cefepime >>  12/20 vancomycin >>   Microbiology results:  11/28 bcx x2: 1/2 CoNS FINAL 12/01 BAL fungus:pending 12/01 BAL AF: negative 12/01 BAL: normal flora  12/20 BCx x1:  12/19 bcx x1:  12/20 Sputum: Pending (gram statin: few GPC in pairs, few GP coccobacillus) FINAL 12/20 MRSA PCR: neg 12/19 ur step pneum: neg 12/19 ur legionella: neg  Thank you for allowing pharmacy to be a part of this patient's care.  Lynelle Doctor 12/18/2016 1:14 PM

## 2016-12-18 NOTE — Progress Notes (Signed)
PROGRESS NOTE    Heidi [name]  [id number] DOB: May 23, 1986 DOA: 12/14/2016 PCP: Maren Reamer, MD   Brief Narrative: 30 y.o. female with medical history significant for Hodgkin lymphoma, presenting to the emergency department with 1 week of progressive dyspnea and right sided edema. She had admitted to the hospital approximately 3 weeks ago for acute hypoxic respiratory failure suspected secondary to CAP. She left AGAINST MEDICAL ADVICE on 12/04/2016 and, while she initially did okay back at home, she has developed recurrence in severe dyspnea over the past week.EKG features a sinus tachycardia with rate 123 and chest x-ray is notable for complete opacification of the right hemithorax, likely diffuse consolidation. 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 C/o: Dyspnea; swelling of right face, RUE, RLE (on admission).  Assessment & Plan:  #Acute hypoxic respiratory failure most likely due to progression of lymphoma. Less likely healthcare associated pneumonia. -CT chest showed right lung complete opacity and increase in the site of left lung nodules represent neoplastic disease versus pneumonia. -Repeat x-ray with possible underlying pneumonia and lymphoma. I will continue IV vancomycin and cefepime. Continue to follow up culture results. Currently on Solu-Medrol 60 mg every 6 hours as per pulmonologist. -Oncology and radiation oncology consult service evaluated the patient. She is very noncompliant with the outpatient follow-up. Starting radiation therapy since 12/21. Follow-up further recommendation. Oncologist is thinking about possible inpatient treatment with chemotherapy. -Continue bronchodilators and breathing treatment.  -Monitor leukocytosis. -Patient is refusing telemeter. I discontinued the cardiac telemetry and will transfer patient out of stepdown unit.  # Possible right brachiocephalic vein DVT: Reviewed the results of CT chest which was consistent with nonopacification of right upper  brachiocephalic vein and right IJ concerning for occlusion. Unable to rule out if this is thrombus given history of malignancy. I discussed with Dr. Irene Limbo on 12/21, patient's oncologist who recommended to start therapeutic Lovenox for systemic anticoagulation. He reported that he may be able to switch to oral anticoagulation when he sees patient in the clinic. -Started subcutaneous therapeutic Lovenox since December 21. -The chest x-ray showed coiled portacatheter. I consulted interventional radiology to evaluate for possible removal.  #Hodgkins lymphoma: On Solu-Medrol IV. Follow-up medicine oncology and rotation oncologist evaluation and recommendation. On radiation treatment today.  #Possible pneumonia, healthcare associated pneumonia: Continue antibiotics as discussed above. Follow-up cultures.  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 Treatment including radiation therapy as discussed above. -I'll consult today.  # Hypertensive urgency and Sinus tachycardia: Continue labetalol 200 mg 3 times a day. Discontinue telemeter as per patient request. Monitor blood pressure and heart rate. On hydralazine IV as needed.  Discontinue IV fluid as patient has good oral intake.  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 Remains in ICU step down today  Consultants:   [place] oncologist  Radiation therapy.  Procedures:None    Antimicrobials:IV vancomycin and cefepime since December 19  Subjective:Patient was seen and examined at bedside. No new event. A moderate amount of the cardiac telemetry monitor and feeling itching.  I discussed about further plan including waiting recommendation from oncology and radiation oncologist. She verbalized understanding. Objective: Vitals:   12/18/16 0411 12/18/16 0715 12/18/16 0800 12/18/16 1200  BP: (!) 139/97   (!) 151/42  Pulse: (!) 122 (!) 110 (!) 104   Resp: 16 (!) 26 (!) 23   Temp:   97.7 F (36.5 C) 98.4 F (36.9 C)  TempSrc:   Oral Oral  SpO2: 97% 96% 95%   Weight:      Height:        Intake/Output Summary (Last 24 hours) at 12/18/16 1258 Last data filed at 12/18/16 0754  Gross per 24 hour  Intake             1050 ml  Output                0 ml  Net             1050 ml   Filed Weights   12/14/16 1900 12/14/16 2255  Weight: 65.8 kg (145 lb) 74.5 kg (164 lb 3.9 oz)    Examination:  General exam: Not in distress lying on bed, right facial swelling stable. Eyes: PERRLA  Respiratory system: No breath sound on the right side,leftsideclear.Noaxillarymuscleuse.. Cardiovascular system: S1-S2 heard, regular tachycardic. No murmur. No bilateral pedal edema. Gastrointestinal system: Abdomen soft, nontender. Bowel sound positive. Central nervous system: Alert and oriented. No focal neurological deficits. Extremities: Symmetric 5 x 5 power. Skin: No rashes, lesions or ulcers Psychiatry: Judgement and insight appear normal. Mood & affect appropriate.     Data Reviewed: I have personally reviewed following labs and imaging studies  CBC:  Recent Labs Lab 12/14/16 1946 12/15/16 0221 12/18/16 0423  WBC 13.5* 14.0* 18.1*  NEUTROABS  --  12.3*  --   HGB 11.5* 11.1* 10.3*  HCT 35.8* 34.1* 32.7*  MCV 92.0 92.4 93.7  PLT 879* 815* 123456*   Basic Metabolic Panel:  Recent Labs Lab 12/14/16 1946 12/15/16 0221 12/18/16 0423  NA 136 135 138  K 4.1 4.1 4.4   CL 95* 95* 97*  CO2 34* 33* 35*  GLUCOSE 117* 118* 127*  BUN 7 7 9   CREATININE 0.50 0.42* 0.43*  CALCIUM 7.8* 7.9* 8.3*   GFR: Estimated Creatinine Clearance: 107.4 mL/min (by C-G formula based on SCr of 0.43 mg/dL (L)). Liver Function Tests:  Recent Labs Lab 12/14/16 1946 12/15/16 0221  AST 18 15  ALT 12* 12*  ALKPHOS 116 101  BILITOT 0.7 0.6  PROT 4.6* 5.0*  ALBUMIN 1.8* 2.3*   No results for input(s): LIPASE, AMYLASE in the last 168 hours. No results for input(s): AMMONIA in the last 168 hours. Coagulation Profile:  Recent Labs Lab 12/17/16 0355 12/18/16 0423  INR 1.12 1.19   Cardiac Enzymes: No results for input(s): CKTOTAL, CKMB, CKMBINDEX, TROPONINI in the last 168 hours. BNP (last 3 results) No results for input(s): PROBNP in the last 8760 hours. HbA1C: No results for input(s): HGBA1C in the last 72 hours. CBG: No results for input(s): GLUCAP in the last 168 hours. Lipid Profile: No results for input(s): CHOL, HDL, LDLCALC, TRIG, CHOLHDL, LDLDIRECT in the last 72 hours. Thyroid Function Tests: No results for input(s): TSH, T4TOTAL, FREET4, T3FREE, THYROIDAB in the last 72 hours. Anemia Panel: No results for input(s): VITAMINB12, FOLATE, FERRITIN, TIBC, IRON, RETICCTPCT in the last 72 hours. Sepsis Labs:  Recent Labs Lab 12/14/16 2255 12/15/16 0221 12/17/16 0355  PROCALCITON  --   --  <0.10  LATICACIDVEN 2.2*  1.6  --     Recent Results (from the past 240 hour(s))  Culture, blood (Routine X 2) w Reflex to ID Panel     Status: None (Preliminary result)   Collection Time: 12/14/16  7:45 PM  Result Value Ref Range Status   Specimen Description BLOOD LEFT WRIST  Final   Special Requests BOTTLES DRAWN AEROBIC AND ANAEROBIC 5CC  Final   Culture   Final    NO GROWTH 3 DAYS Performed at [facility]    Report Status PENDING  Incomplete  MRSA PCR Screening     Status: None   Collection Time: 12/14/16 10:52 PM  Result Value Ref Range Status    MRSA by PCR NEGATIVE NEGATIVE Final    Comment:        The GeneXpert MRSA Assay (FDA approved for NASAL specimens only), is one component of a comprehensive MRSA colonization surveillance program. It is not intended to diagnose MRSA infection nor to guide or monitor treatment for MRSA infections.   Culture, sputum-assessment     Status: None   Collection Time: 12/15/16 12:50 AM  Result Value Ref Range Status   Specimen Description SPU  Final   Special Requests Immunocompromised  Final   Sputum evaluation THIS SPECIMEN IS ACCEPTABLE FOR SPUTUM CULTURE  Final   Report Status 12/15/2016 FINAL  Final  Culture, respiratory (NON-Expectorated)     Status: None   Collection Time: 12/15/16 12:50 AM  Result Value Ref Range Status   Specimen Description SPU  Final   Special Requests Immunocompromised Reflexed from [id number]  Final   Gram Stain   Final    MODERATE WBC PRESENT, PREDOMINANTLY PMN FEW SQUAMOUS EPITHELIAL CELLS PRESENT FEW GRAM POSITIVE COCCI IN PAIRS FEW GRAM POSITIVE COCCOBACILLUS    Culture   Final    Consistent with normal respiratory flora. Performed at [facility]    Report Status 12/17/2016 FINAL  Final  Culture, blood (Routine X 2) w Reflex to ID Panel     Status: None (Preliminary result)   Collection Time: 12/15/16  2:25 AM  Result Value Ref Range Status   Specimen Description BLOOD RIGHT WRIST  Final   Special Requests BOTTLES DRAWN AEROBIC ONLY 5CC  Final   Culture   Final    NO GROWTH 2 DAYS Performed at [facility]    Report Status PENDING  Incomplete         Radiology Studies: Dg Chest 2 View  Result Date: 12/18/2016 CLINICAL DATA:  Worsening shortness of breath x3 weeks, history of Hodgkin's lymphoma, cough, weakness EXAM: CHEST  2 VIEW COMPARISON:  CT chest dated 12/15/2016 FINDINGS: Near complete opacification of the right lung, possibly reflecting a combination of lymphoma and pneumonia, better evaluated on CT. Mild patchy left  upper lobe opacity, suspicious for pneumonia when correlating with CT. Malpositioned right chest port is looped in the neck and likely terminates in the right internal jugular vein. IMPRESSION: Near complete opacification of the right lung, possibly reflecting combination of lymphoma and pneumonia, better evaluated on CT. Mild patchy left upper lobe opacity, suspicious for pneumonia. Malpositioned right chest port is looped in the neck and likely terminates in the right internal jugular vein, unchanged. Removal is suggested. Electronically Signed   By: Julian Hy M.D.   On: 12/18/2016 12:06        Scheduled Meds: . ceFEPime (MAXIPIME) IV  1 g Intravenous Q8H  . enoxaparin (LOVENOX) injection  1 mg/kg Subcutaneous Q12H  .  labetalol  200 mg Oral TID  . levalbuterol  0.63 mg Nebulization Q6H  . mouth rinse  15 mL Mouth Rinse BID  . methylPREDNISolone (SOLU-MEDROL) injection  60 mg Intravenous Q6H  . pantoprazole  40 mg Oral Daily  . sodium chloride  1,000 mL Intravenous Once  . sodium chloride flush  3 mL Intravenous Q12H  . vancomycin  750 mg Intravenous Q8H   Continuous Infusions:    LOS: 4 days    Dron Tanna Furry, MD [facility] Pager [phone]  If 7PM-7AM, please contact night-coverage [email or web address] Password [facility] 12/18/2016, 12:58 PM

## 2016-12-18 NOTE — Progress Notes (Signed)
Brief pharmacy antibiotic note:  for full details please see note from today by Glori Luis D.  Assessment and Plan. -vanc trough=9, goal 15-20 -Increase vancomycin to 1gm IV q8h -Follow renal function, clinical course and additional VT as needed  Dolly Rias RPh 12/18/2016, 4:50 PM Pager [phone]

## 2016-12-18 NOTE — Progress Notes (Signed)
Patient has consistently refused nebulizer treatments. Order changed to prn per patient request.

## 2016-12-18 NOTE — Progress Notes (Signed)
Pt transferred to room 1419 from ICU- refused to have telemetry monitor attached and continuous pulse ox. Stated the wires and sticky leads "itch her." Pt educated on the need to monitor her HR and rhythm, but continued to refuse. MD Carolin Sicks made aware. Telemetry & cont pulse ox orders D/C'd per VO, and will check VS q4h instead.

## 2016-12-18 NOTE — Progress Notes (Signed)
MD verbally ordered to remove pt off of telemetry until she transfers upstairs, due to pt being very uncomfortable with the ICU/SD telemetry monitor and leads.   MD in room when telemetry was removed and monitor was put on standby.  Prior to removal, pt HR and rhythm, O2 saturation, and BP were all stable.   Will monitor pt.

## 2016-12-19 LAB — CULTURE, BLOOD (ROUTINE X 2): Culture: NO GROWTH

## 2016-12-19 LAB — PROTIME-INR
INR: 1.12
PROTHROMBIN TIME: 14.4 s (ref 11.4–15.2)

## 2016-12-19 MED ORDER — ENOXAPARIN SODIUM 80 MG/0.8ML ~~LOC~~ SOLN
1.0000 mg/kg | Freq: Two times a day (BID) | SUBCUTANEOUS | Status: DC
Start: 2016-12-21 — End: 2016-12-19

## 2016-12-19 MED ORDER — LEVOFLOXACIN 750 MG PO TABS
750.0000 mg | ORAL_TABLET | Freq: Every day | ORAL | Status: DC
  Administered 2016-12-19 – 2016-12-23 (×5): 750 mg via ORAL
  Filled 2016-12-19 (×5): qty 1

## 2016-12-19 MED ORDER — ENOXAPARIN SODIUM 80 MG/0.8ML ~~LOC~~ SOLN
1.0000 mg/kg | Freq: Two times a day (BID) | SUBCUTANEOUS | Status: DC
  Administered 2016-12-19 – 2016-12-21 (×4): 75 mg via SUBCUTANEOUS
  Filled 2016-12-19 (×5): qty 0.8

## 2016-12-19 NOTE — Progress Notes (Addendum)
PROGRESS NOTE    Heidi [name]  [id number] DOB: 30-Jul-1986 DOA: 12/14/2016 PCP: Maren Reamer, MD   Brief Narrative: 30 y.o. female with medical history significant for Hodgkin lymphoma, presenting to the emergency department with 1 week of progressive dyspnea and right sided edema. She had admitted to the hospital approximately 3 weeks ago for acute hypoxic respiratory failure suspected secondary to CAP. She left AGAINST MEDICAL ADVICE on 12/04/2016 and, while she initially did okay back at home, she has developed recurrence in severe dyspnea over the past week.EKG features a sinus tachycardia with rate 123 and chest x-ray is notable for complete opacification of the right hemithorax, likely diffuse consolidation. 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 C/o: Dyspnea; swelling of right face, RUE, RLE (on admission).  Assessment & Plan:  #Acute hypoxic respiratory failure most likely due to progression of lymphoma vs pneumonia -CT chest showed right lung complete opacity and increase in the site of left lung nodules represent neoplastic disease versus pneumonia. -Initially treated with IV vancomycin and cefepime for presumed healthcare associated pneumonia. I think patient's pulmonary findings is most likely related with lymphoma that may have pulmonary infiltrates. I will discontinue IV antibiotics and switched to Levaquin. -Continue to follow up culture results.  -Currently on Solu-Medrol 60 mg every 6 hours as per pulmonologist. Continue for now. We will follow up with oncologist regarding dosage adjustment. -Oncology and radiation oncology consult service evaluated the patient. She is very noncompliant with the outpatient follow-up. Starting radiation therapy since 12/21. Follow-up further recommendation. Oncologist is thinking about possible inpatient treatment with chemotherapy. -Continue bronchodilators and breathing treatment.  -Requiring 3-4 L of oxygen. -Monitor leukocytosis.  # Possible  right brachiocephalic vein DVT: Reviewed the results of CT chest which was consistent with nonopacification of right upper brachiocephalic vein and right IJ concerning for occlusion. Unable to rule out if this is thrombus given history of malignancy. I discussed with Dr. Irene Limbo on 12/21, patient's oncologist who recommended to start therapeutic Lovenox for systemic anticoagulation. He reported that he may be able to switch to oral anticoagulation when he sees patient in the clinic. -Started subcutaneous therapeutic Lovenox since December 21. -The chest x-ray showed coiled portacatheter. I consulted interventional radiology to evaluate for possible removal. We need to discuss with oncologist regarding new Port-A-Cath placement. I discussed with the IR team today.  #Hodgkins lymphoma: On Solu-Medrol IV. Follow-up medicine oncology and rotation oncologist evaluation and recommendation. On radiation treatment today.  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 Treatment including radiation therapy as discussed above.  # Hypertensive urgency and Sinus tachycardia: Continue labetalol 200 mg 3 times a day. Discontinue telemeter as per patient request. Monitor blood pressure and heart rate. On hydralazine IV as needed.  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 R  Consultants:   [place] oncologist  Radiation therapy.  Procedures:None    Antimicrobials: IV vancomycin and cefepime from 12/19 to 12/24.  Started levaquin on 12/24  Subjective:Patient was seen and examined at bedside. No  new event. Patient is lying on bed. Reported shortness of breath and dyspneic while taking steps. Denied chest pain, nausea vomiting or abdominal pain.  Objective: Vitals:   12/18/16 1500 12/18/16 2036 12/18/16 2131 12/19/16 0442  BP: (!) 148/66  (!) 145/90 (!) 153/60  Pulse: 93  99 85  Resp: (!) 22  (!) 22 (!) 21  Temp: 98.4 F (36.9 C)  98.6 F (37 C) 98.7 F (37.1 C)  TempSrc: Oral  Oral Oral  SpO2: 92% 95% 95% 95%  Weight:      Height:        Intake/Output Summary (Last 24 hours) at 12/19/16 1250 Last data filed at 12/19/16 0552  Gross per 24 hour  Intake              940 ml  Output                0 ml  Net              940 ml   Filed Weights   12/14/16 1900 12/14/16 2255  Weight: 65.8 kg (145 lb) 74.5 kg (164 lb 3.9 oz)    Examination:  General exam: Not in distress lying on bed, right facial swelling stable. Eyes: PERRLA  Respiratory system:No breath sound on right side ,left-sided clear.No accessory muscle use Cardiovascular system: S1-S2 heard, regular. No murmur. No bilateral pedal edema. Gastrointestinal system: Abdomen soft, nontender. Bowel sound positive. Central nervous system: Alert and oriented. No focal neurological deficits. Extremities: Symmetric 5 x 5 power. Skin: No rashes, lesions or ulcers Psychiatry: Judgement and insight appear normal. Mood & affect appropriate.     Data Reviewed: I have personally reviewed following labs and imaging studies  CBC:  Recent Labs Lab 12/14/16 1946 12/15/16 0221 12/18/16 0423  WBC 13.5* 14.0* 18.1*  NEUTROABS  --  12.3*  --   HGB 11.5* 11.1* 10.3*  HCT 35.8* 34.1* 32.7*  MCV 92.0 92.4 93.7  PLT 879* 815* 123456*   Basic Metabolic Panel:  Recent Labs Lab 12/14/16 1946 12/15/16 0221 12/18/16 0423  NA 136 135 138  K 4.1 4.1 4.4  CL 95* 95* 97*  CO2  34* 33* 35*  GLUCOSE 117* 118* 127*  BUN 7 7 9   CREATININE 0.50 0.42* 0.43*  CALCIUM 7.8* 7.9* 8.3*   GFR: Estimated Creatinine Clearance: 107.4 mL/min (by C-G formula based on SCr of 0.43 mg/dL (L)). Liver Function Tests:  Recent Labs Lab 12/14/16 1946 12/15/16 0221  AST 18 15  ALT 12* 12*  ALKPHOS 116 101  BILITOT 0.7 0.6  PROT 4.6* 5.0*  ALBUMIN 1.8* 2.3*   No results for input(s): LIPASE, AMYLASE in the last 168 hours. No results for input(s): AMMONIA in the last 168 hours. Coagulation Profile:  Recent Labs Lab 12/17/16 0355 12/18/16 0423 12/19/16 0542  INR 1.12 1.19 1.12   Cardiac Enzymes: No results for input(s): CKTOTAL, CKMB, CKMBINDEX, TROPONINI in the last 168 hours. BNP (last 3 results) No results for input(s): PROBNP in the last 8760 hours. HbA1C: No results for input(s): HGBA1C in the last 72 hours. CBG:  Recent Labs Lab 12/18/16 2318  GLUCAP 172*   Lipid Profile: No results for input(s): CHOL, HDL, LDLCALC, TRIG, CHOLHDL, LDLDIRECT in the last 72 hours. Thyroid Function Tests: No results for input(s): TSH, T4TOTAL, FREET4, T3FREE, THYROIDAB in the last 72 hours. Anemia Panel: No results for input(s): VITAMINB12, FOLATE, FERRITIN, TIBC, IRON, RETICCTPCT in the last 72 hours. Sepsis Labs:  Recent Labs  Lab 12/14/16 2255 12/15/16 0221 12/17/16 0355  PROCALCITON  --   --  <0.10  LATICACIDVEN 2.2* 1.6  --     Recent Results (from the past 240 hour(s))  Culture, blood (Routine X 2) w Reflex to ID Panel     Status: None   Collection Time: 12/14/16  7:45 PM  Result Value Ref Range Status   Specimen Description BLOOD LEFT WRIST  Final   Special Requests BOTTLES DRAWN AEROBIC AND ANAEROBIC 5CC  Final   Culture   Final    NO GROWTH 5 DAYS Performed at [facility]    Report Status 12/19/2016 FINAL  Final  MRSA PCR Screening     Status: None   Collection Time: 12/14/16 10:52 PM  Result Value Ref Range Status   MRSA by PCR NEGATIVE  NEGATIVE Final    Comment:        The GeneXpert MRSA Assay (FDA approved for NASAL specimens only), is one component of a comprehensive MRSA colonization surveillance program. It is not intended to diagnose MRSA infection nor to guide or monitor treatment for MRSA infections.   Culture, sputum-assessment     Status: None   Collection Time: 12/15/16 12:50 AM  Result Value Ref Range Status   Specimen Description SPU  Final   Special Requests Immunocompromised  Final   Sputum evaluation THIS SPECIMEN IS ACCEPTABLE FOR SPUTUM CULTURE  Final   Report Status 12/15/2016 FINAL  Final  Culture, respiratory (NON-Expectorated)     Status: None   Collection Time: 12/15/16 12:50 AM  Result Value Ref Range Status   Specimen Description SPU  Final   Special Requests Immunocompromised Reflexed from [id number]  Final   Gram Stain   Final    MODERATE WBC PRESENT, PREDOMINANTLY PMN FEW SQUAMOUS EPITHELIAL CELLS PRESENT FEW GRAM POSITIVE COCCI IN PAIRS FEW GRAM POSITIVE COCCOBACILLUS    Culture   Final    Consistent with normal respiratory flora. Performed at [facility]    Report Status 12/17/2016 FINAL  Final  Culture, blood (Routine X 2) w Reflex to ID Panel     Status: None (Preliminary result)   Collection Time: 12/15/16  2:25 AM  Result Value Ref Range Status   Specimen Description BLOOD RIGHT WRIST  Final   Special Requests BOTTLES DRAWN AEROBIC ONLY 5CC  Final   Culture   Final    NO GROWTH 4 DAYS Performed at [facility]    Report Status PENDING  Incomplete         Radiology Studies: Dg Chest 2 View  Result Date: 12/18/2016 CLINICAL DATA:  Worsening shortness of breath x3 weeks, history of Hodgkin's lymphoma, cough, weakness EXAM: CHEST  2 VIEW COMPARISON:  CT chest dated 12/15/2016 FINDINGS: Near complete opacification of the right lung, possibly reflecting a combination of lymphoma and pneumonia, better evaluated on CT. Mild patchy left upper lobe opacity,  suspicious for pneumonia when correlating with CT. Malpositioned right chest port is looped in the neck and likely terminates in the right internal jugular vein. IMPRESSION: Near complete opacification of the right lung, possibly reflecting combination of lymphoma and pneumonia, better evaluated on CT. Mild patchy left upper lobe opacity, suspicious for pneumonia. Malpositioned right chest port is looped in the neck and likely terminates in the right internal jugular vein, unchanged. Removal is suggested. Electronically Signed   By: Julian Hy M.D.   On: 12/18/2016 12:06        Scheduled Meds: . enoxaparin (LOVENOX)  injection  1 mg/kg Subcutaneous Q12H  . labetalol  200 mg Oral TID  . mouth rinse  15 mL Mouth Rinse BID  . methylPREDNISolone (SOLU-MEDROL) injection  60 mg Intravenous Q6H  . pantoprazole  40 mg Oral Daily  . sodium chloride  1,000 mL Intravenous Once  . sodium chloride flush  3 mL Intravenous Q12H   Continuous Infusions:    LOS: 5 days    Luca Burston Tanna Furry, MD [facility] Pager [phone]  If 7PM-7AM, please contact night-coverage [email or web address] Password [email or web address] 12/19/2016, 12:50 PM

## 2016-12-19 NOTE — Progress Notes (Addendum)
Patient ID: Heidi [name], female   DOB: [birth year], 30 y.o.   MRN: [id number]  Request for [facility] removal Imaging does reveal coiled PAC per Dr Annamaria Boots Previous imaging does show evidence of same.  Placed 04/2016 in IR  Plan for removal possibly Tues 12/26 Plan for new [facility]?--- replacement?    Discussed with MD He wants to talk with Oncology first Check chart Tues for poss procedure

## 2016-12-19 NOTE — Progress Notes (Signed)
ANTICOAGULATION CONSULT NOTE -   Pharmacy Consult for Lovenox Indication: VTE treatment  No Known Allergies  Patient Measurements: Height: 5\' 7"  (170.2 cm) Weight: 164 lb 3.9 oz (74.5 kg) IBW/kg (Calculated) : 61.6 Heparin Dosing Weight:   Vital Signs: Temp: 98.7 F (37.1 C) (12/24 0442) Temp Source: Oral (12/24 0442) BP: 153/60 (12/24 0442) Pulse Rate: 85 (12/24 0442)  Labs:  Recent Labs  12/17/16 0355 12/18/16 0423 12/19/16 0542  HGB  --  10.3*  --   HCT  --  32.7*  --   PLT  --  676*  --   LABPROT 14.5 15.1 14.4  INR 1.12 1.19 1.12  CREATININE  --  0.43*  --     Estimated Creatinine Clearance: 107.4 mL/min (by C-G formula based on SCr of 0.43 mg/dL (L)).   Medical History: Past Medical History:  Diagnosis Date  . ARDS (adult respiratory distress syndrome) ([place])   . Asthma   . Hodgkin lymphoma ([place])   . Hypertension     Assessment: 71 yoF admitted with respiratory failure 2/2 progressive Hodgkins lymphoma with lung involvement.  Pt also found to have possible right brachiocephalic vein DVT.  MD and Oncologist recommend full dose lovenox for now with possible consideration of oral anticoagulation at follow-up visit outpatient.  Pharmacy consulted to assist with dosing.   12/19/2016 CrCl >100 ml/min CBC:  Hgb low, stable, plts elevated.  No anticoagulation noted PTA  Goal of Therapy:  Anti-Xa level 0.6-1 units/ml 4hrs after LMWH dose given Monitor platelets by anticoagulation protocol: Yes   Plan:  Continue Lovenox 1mg /kg q12h F/u if/when ok to change to q24h dosing F/u daily CBC, renal function F/u signs / symptoms of bleeding  Dolly Rias RPh 12/19/2016, 10:42 AM Pager [phone]

## 2016-12-19 NOTE — Progress Notes (Signed)
Pharmacy Antibiotic Note  Heidi [name] is a 30 y.o. female with hx of Hodgkin's lymphoma presented to the ED on 12/14/16 with c/o SOB.  Vancomycin and cefepime were initially started for suspected PNA, now pharmacy consulted to dose levaquin for CAP.  Today, 12/19/2016: - afeb, wbc up (but on steroids) -  scr low 0.43 (crcl~100)  Plan: levaquin 750mg  po daily Pharmacy will sign off as renal fxn is stable  Please re-consult if needed _______________________________  Height: 5\' 7"  (170.2 cm) Weight: 164 lb 3.9 oz (74.5 kg) IBW/kg (Calculated) : 61.6  Temp (24hrs), Avg:98.6 F (37 C), Min:98.4 F (36.9 C), Max:98.7 F (37.1 C)   Recent Labs Lab 12/14/16 1946 12/14/16 2255 12/15/16 0221 12/18/16 0423 12/18/16 1554  WBC 13.5*  --  14.0* 18.1*  --   CREATININE 0.50  --  0.42* 0.43*  --   LATICACIDVEN  --  2.2* 1.6  --   --   VANCOTROUGH  --   --   --   --  9*    Estimated Creatinine Clearance: 107.4 mL/min (by C-G formula based on SCr of 0.43 mg/dL (L)).    No Known Allergies  Antimicrobials this admission:  12/20 cefepime >> 12/24 12/20 vancomycin >> 12/24 12/24 levaquin >>  Microbiology results:  11/28 bcx x2: 1/2 CoNS FINAL 12/01 BAL fungus:pending 12/01 BAL AF: negative 12/01 BAL: normal flora  12/20 BCx x1:  12/19 bcx x1:  12/20 Sputum: Pending (gram statin: few GPC in pairs, few GP coccobacillus) FINAL 12/20 MRSA PCR: neg 12/19 ur step pneum: neg 12/19 ur legionella: neg  Thank you for allowing pharmacy to be a part of this patient's care.  Dolly Rias RPh 12/19/2016, 12:58 PM Pager [phone]

## 2016-12-20 LAB — PROTIME-INR
INR: 1.1
PROTHROMBIN TIME: 14.3 s (ref 11.4–15.2)

## 2016-12-20 LAB — CULTURE, BLOOD (ROUTINE X 2): CULTURE: NO GROWTH

## 2016-12-20 NOTE — Progress Notes (Signed)
PROGRESS NOTE    Heidi [name]  [id number] DOB: 07/11/1986 DOA: 12/14/2016 PCP: Maren Reamer, MD   Brief Narrative: 30 y.o. female with medical history significant for Hodgkin lymphoma, presenting to the emergency department with 1 week of progressive dyspnea and right sided edema. She had admitted to the hospital approximately 3 weeks ago for acute hypoxic respiratory failure suspected secondary to CAP. She left AGAINST MEDICAL ADVICE on 12/04/2016 and, while she initially did okay back at home, she has developed recurrence in severe dyspnea over the past week.EKG features a sinus tachycardia with rate 123 and chest x-ray is notable for complete opacification of the right hemithorax, likely diffuse consolidation. 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 C/o: Dyspnea; swelling of right face, RUE, RLE (on admission).  Assessment & Plan:  #Acute hypoxic respiratory failure most likely due to progression of lymphoma vs pneumonia -CT chest showed right lung complete opacity and increase in the site of left lung nodules represent neoplastic disease versus pneumonia. -Initially treated with IV vancomycin and cefepime for presumed healthcare associated pneumonia. I think patient's pulmonary findings is most likely related with lymphoma that may have pulmonary infiltrates. Antibiotics is to Levaquin. -Continue to follow up culture results. No growth so far. -Currently on Solu-Medrol 60 mg every 6 hours as per pulmonologist. Continue for now. We will follow up with oncologist regarding dosage adjustment. -Oncology and radiation oncology consult service evaluated the patient. She is very noncompliant with the outpatient follow-up. Starting radiation therapy since 12/21. Follow-up further recommendation. Oncologist is thinking about possible inpatient treatment with chemotherapy. -Continue bronchodilators and breathing treatment.  -Requiring 3-4 L of oxygen. -Monitor leukocytosis.  # Possible right  brachiocephalic vein DVT: Reviewed the results of CT chest which was consistent with nonopacification of right upper brachiocephalic vein and right IJ concerning for occlusion. Unable to rule out if this is thrombus given history of malignancy. I discussed with Dr. Irene Limbo on 12/21, patient's oncologist who recommended to start therapeutic Lovenox for systemic anticoagulation. He reported that he may be able to switch to oral anticoagulation when he sees patient in the clinic. -Started subcutaneous therapeutic Lovenox since December 21. -The chest x-ray showed coiled portacatheter. I consulted interventional radiology to evaluate for possible removal. We need to discuss with oncologist regarding new Port-A-Cath placement.  -Discussed with the patient and she agreed with the plan. Also agreed with an coagulation.  #Hodgkins lymphoma: On Solu-Medrol IV. Follow-up medicine oncology and radiation oncologist evaluation and recommendation.   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 Treatment including radiation therapy as discussed above.  # Hypertensive urgency and Sinus tachycardia: Continue labetalol 200 mg 3 times a day. Discontinue telemeter as per patient request. Blood pressure and heart rate improving. Continue to monitor. 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  Disposition Plan:Likely discharge to home in 2-3 days.   Consultants:   Pulmonologist  Medicine oncologist  Radiation  therapy.  Procedures:None  Antimicrobials: IV vancomycin and cefepime from 12/19 to 12/24.  Started levaquin on 12/24  Subjective:Patient was seen and examined at  bedside. No new event. Lying in bed, shortness of breath is stable. Denied chest pain, nausea vomiting or headache. Objective: Vitals:   12/19/16 0442 12/19/16 1340 12/19/16 2153 12/20/16 0508  BP: (!) 153/60 137/73 (!) 167/88 (!) 149/62  Pulse: 85 75 98 83  Resp: (!) 21 18 20 18   Temp: 98.7 F (37.1 C) 98.3 F (36.8 C) 98.8 F (37.1 C) 98.7 F (37.1 C)  TempSrc: Oral Oral Oral Oral  SpO2: 95% 94% 96% 93%  Weight:      Height:        Intake/Output Summary (Last 24 hours) at 12/20/16 1154 Last data filed at 12/20/16 0900  Gross per 24 hour  Intake              320 ml  Output                0 ml  Net              320 ml   Filed Weights   12/14/16 1900 12/14/16 2255  Weight: 65.8 kg (145 lb) 74.5 kg (164 lb 3.9 oz)    Examination:  General exam: Right-sided facial swelling, not in distress. Eyes: PERRLA  Respiratory system: No breath sound on right side,left side clear.Noextremitymuscleuse. Cardiovascular system: S1-S2 heard, regular. No murmur. No bilateral pedal edema. Gastrointestinal system: Abdomen soft, nontender. Bowel sound positive. Central nervous system: Alert and oriented. No focal neurological deficits. Extremities: Symmetric 5 x 5 power. Skin: No rashes, lesions or ulcers Psychiatry: Judgement and insight appear normal. Mood & affect appropriate.     Data Reviewed: I have personally reviewed following labs and imaging studies  CBC:  Recent Labs Lab 12/14/16 1946 12/15/16 0221 12/18/16 0423  WBC 13.5* 14.0* 18.1*  NEUTROABS  --  12.3*  --   HGB 11.5* 11.1* 10.3*  HCT 35.8* 34.1* 32.7*  MCV 92.0 92.4 93.7  PLT 879* 815* 123456*   Basic Metabolic Panel:  Recent Labs Lab 12/14/16 1946 12/15/16 0221 12/18/16 0423  NA 136 135 138  K 4.1 4.1 4.4  CL 95* 95* 97*  CO2 34* 33* 35*   GLUCOSE 117* 118* 127*  BUN 7 7 9   CREATININE 0.50 0.42* 0.43*  CALCIUM 7.8* 7.9* 8.3*   GFR: Estimated Creatinine Clearance: 107.4 mL/min (by C-G formula based on SCr of 0.43 mg/dL (L)). Liver Function Tests:  Recent Labs Lab 12/14/16 1946 12/15/16 0221  AST 18 15  ALT 12* 12*  ALKPHOS 116 101  BILITOT 0.7 0.6  PROT 4.6* 5.0*  ALBUMIN 1.8* 2.3*   No results for input(s): LIPASE, AMYLASE in the last 168 hours. No results for input(s): AMMONIA in the last 168 hours. Coagulation Profile:  Recent Labs Lab 12/17/16 0355 12/18/16 0423 12/19/16 0542 12/20/16 0812  INR 1.12 1.19 1.12 1.10   Cardiac Enzymes: No results for input(s): CKTOTAL, CKMB, CKMBINDEX, TROPONINI in the last 168 hours. BNP (last 3 results) No results for input(s): PROBNP in the last 8760 hours. HbA1C: No results for input(s): HGBA1C in the last 72 hours. CBG:  Recent Labs Lab 12/18/16 2318  GLUCAP 172*   Lipid Profile: No results for input(s): CHOL, HDL, LDLCALC, TRIG, CHOLHDL, LDLDIRECT in the last 72 hours. Thyroid Function Tests: No results for input(s): TSH, T4TOTAL, FREET4, T3FREE, THYROIDAB in the last 72 hours. Anemia Panel: No results for input(s): VITAMINB12, FOLATE, FERRITIN, TIBC, IRON, RETICCTPCT in the last 72 hours. Sepsis Labs:  Recent Labs Lab 12/14/16 2255 12/15/16 0221 12/17/16 0355  PROCALCITON  --   --  <0.10  LATICACIDVEN 2.2* 1.6  --     Recent Results (from the past 240 hour(s))  Culture, blood (Routine X 2) w Reflex to ID Panel     Status: None   Collection Time: 12/14/16  7:45 PM  Result Value Ref Range Status   Specimen Description BLOOD LEFT WRIST  Final   Special Requests BOTTLES DRAWN AEROBIC AND ANAEROBIC 5CC  Final   Culture   Final    NO GROWTH 5 DAYS Performed at [facility]    Report Status 12/19/2016 FINAL  Final  MRSA PCR Screening     Status: None   Collection Time: 12/14/16 10:52 PM  Result Value Ref Range Status   MRSA by PCR  NEGATIVE NEGATIVE Final    Comment:        The GeneXpert MRSA Assay (FDA approved for NASAL specimens only), is one component of a comprehensive MRSA colonization surveillance program. It is not intended to diagnose MRSA infection nor to guide or monitor treatment for MRSA infections.   Culture, sputum-assessment     Status: None   Collection Time: 12/15/16 12:50 AM  Result Value Ref Range Status   Specimen Description SPU  Final   Special Requests Immunocompromised  Final   Sputum evaluation THIS SPECIMEN IS ACCEPTABLE FOR SPUTUM CULTURE  Final   Report Status 12/15/2016 FINAL  Final  Culture, respiratory (NON-Expectorated)     Status: None   Collection Time: 12/15/16 12:50 AM  Result Value Ref Range Status   Specimen Description SPU  Final   Special Requests Immunocompromised Reflexed from [id number]  Final   Gram Stain   Final    MODERATE WBC PRESENT, PREDOMINANTLY PMN FEW SQUAMOUS EPITHELIAL CELLS PRESENT FEW GRAM POSITIVE COCCI IN PAIRS FEW GRAM POSITIVE COCCOBACILLUS    Culture   Final    Consistent with normal respiratory flora. Performed at [facility]    Report Status 12/17/2016 FINAL  Final  Culture, blood (Routine X 2) w Reflex to ID Panel     Status: None (Preliminary result)   Collection Time: 12/15/16  2:25 AM  Result Value Ref Range Status   Specimen Description BLOOD RIGHT WRIST  Final   Special Requests BOTTLES DRAWN AEROBIC ONLY 5CC  Final   Culture   Final    NO GROWTH 4 DAYS Performed at [facility]    Report Status PENDING  Incomplete         Radiology Studies: No results found.      Scheduled Meds: . enoxaparin (LOVENOX) injection  1 mg/kg Subcutaneous Q12H  . labetalol  200 mg Oral TID  . levofloxacin  750 mg Oral Daily  . mouth rinse  15 mL Mouth Rinse BID  . methylPREDNISolone (SOLU-MEDROL) injection  60 mg Intravenous Q6H  . pantoprazole  40 mg Oral Daily  . sodium chloride  1,000 mL Intravenous Once  . sodium  chloride flush  3 mL Intravenous Q12H   Continuous Infusions:    LOS: 6 days    Heidi Chanda Tanna Furry, MD [facility] Pager [phone]  If 7PM-7AM, please contact night-coverage [email or web address] Password [facility] 12/20/2016, 11:54 AM

## 2016-12-21 ENCOUNTER — Ambulatory Visit: Admit: 2016-12-21 | Discharge: 2016-12-21 | Disposition: A | Discharge status: Home / Self Care | Payer: Medicaid Other

## 2016-12-21 ENCOUNTER — Other Ambulatory Visit: Payer: Self-pay | Admitting: Hematology

## 2016-12-21 LAB — CBC
HCT: 33.5 % — ABNORMAL LOW (ref 36.0–46.0)
HEMOGLOBIN: 10.6 g/dL — AB (ref 12.0–15.0)
MCH: 30.2 pg (ref 26.0–34.0)
MCHC: 31.6 g/dL (ref 30.0–36.0)
MCV: 95.4 fL (ref 78.0–100.0)
Platelets: 582 10*3/uL — ABNORMAL HIGH (ref 150–400)
RBC: 3.51 MIL/uL — AB (ref 3.87–5.11)
RDW: 16.1 % — ABNORMAL HIGH (ref 11.5–15.5)
WBC: 14.3 10*3/uL — ABNORMAL HIGH (ref 4.0–10.5)

## 2016-12-21 LAB — PROTIME-INR
INR: 1.11
PROTHROMBIN TIME: 14.3 s (ref 11.4–15.2)

## 2016-12-21 LAB — BASIC METABOLIC PANEL
ANION GAP: 7 (ref 5–15)
BUN: 11 mg/dL (ref 6–20)
CHLORIDE: 96 mmol/L — AB (ref 101–111)
CO2: 36 mmol/L — ABNORMAL HIGH (ref 22–32)
Calcium: 8.2 mg/dL — ABNORMAL LOW (ref 8.9–10.3)
Creatinine, Ser: 0.5 mg/dL (ref 0.44–1.00)
GFR calc non Af Amer: >60 mL/min (ref >60)
Glucose, Bld: 143 mg/dL — ABNORMAL HIGH (ref 65–99)
POTASSIUM: 4.1 mmol/L (ref 3.5–5.1)
SODIUM: 139 mmol/L (ref 135–145)

## 2016-12-21 MED ORDER — CLINDAMYCIN PHOSPHATE 600 MG/50ML IV SOLN
600.0000 mg | Freq: Once | INTRAVENOUS | Status: AC
  Administered 2016-12-22: 600 mg via INTRAVENOUS
  Filled 2016-12-21: qty 50

## 2016-12-21 NOTE — Progress Notes (Signed)
Pt. Educated on NPO orders after midnight. Continuously refuses to stop eating in the room. Pt. Made aware procedure may be delayed due to this, states she will "stop eating after 1 am. Will pass onto day shift nurse and continue to monitor pt.

## 2016-12-21 NOTE — Progress Notes (Addendum)
Heidi [name]   DOB:03/22/1986   [id number]   ID:[id number]  ONCOLOGY FOLLOW UP NOTE   Subjective: I am covering Dr. Irene Limbo to see the pt. She has started radiation last week, and will complete radiation on Thursday 12/28. She states her right side swelling is stable overall, she still has mild to moderate dyspnea on exertion, on 4 L of nasal, no oxygen, able to ambulate with oxygen. She denies pain, nausea, or other new symptoms.   Objective:  Vitals:   12/21/16 0432 12/21/16 1259  BP: 138/65 136/79  Pulse: 76 91  Resp: 18 18  Temp: 98.4 F (36.9 C) 98 F (36.7 C)    Body mass index is 25.72 kg/m.  Intake/Output Summary (Last 24 hours) at 12/21/16 1643 Last data filed at 12/20/16 1800  Gross per 24 hour  Intake              200 ml  Output                0 ml  Net              200 ml     Sclerae unicteric  Oropharynx clear  No peripheral adenopathy  Lungs: significantly decreased breath sound on right  Heart regular rate and rhythm  Abdomen benign  MSK no focal spinal tenderness, no peripheral edema  Neuro nonfocal  (+) significant edema on right side, from head to foot   CBG (last 3)   Recent Labs  12/18/16 2318  GLUCAP 172*     Labs:  Lab Results  Component Value Date   WBC 14.3 (H) 12/21/2016   HGB 10.6 (L) 12/21/2016   HCT 33.5 (L) 12/21/2016   MCV 95.4 12/21/2016   PLT 582 (H) 12/21/2016   NEUTROABS 12.3 (H) 12/15/2016   CMP Latest Ref Rng & Units 12/21/2016 12/18/2016 12/15/2016  Glucose 65 - 99 mg/dL 143(H) 127(H) 118(H)  BUN 6 - 20 mg/dL 11 9 7   Creatinine 0.44 - 1.00 mg/dL 0.50 0.43(L) 0.42(L)  Sodium 135 - 145 mmol/L 139 138 135  Potassium 3.5 - 5.1 mmol/L 4.1 4.4 4.1  Chloride 101 - 111 mmol/L 96(L) 97(L) 95(L)  CO2 22 - 32 mmol/L 36(H) 35(H) 33(H)  Calcium 8.9 - 10.3 mg/dL 8.2(L) 8.3(L) 7.9(L)  Total Protein 6.5 - 8.1 g/dL - - 5.0(L)  Total Bilirubin 0.3 - 1.2 mg/dL - - 0.6  Alkaline Phos 38 - 126 U/L - - 101  AST 15 - 41 U/L - -  15  ALT 14 - 54 U/L - - 12(L)    Urine Studies No results for input(s): UHGB, CRYS in the last 72 hours.  Invalid input(s): UACOL, UAPR, USPG, UPH, UTP, UGL, UKET, UBIL, UNIT, UROB, [place], UEPI, UWBC, [place], [place], [place], [place], [place]  Basic Metabolic Panel:  Recent Labs Lab 12/14/16 1946 12/15/16 0221 12/18/16 0423 12/21/16 0512  NA 136 135 138 139  K 4.1 4.1 4.4 4.1  CL 95* 95* 97* 96*  CO2 34* 33* 35* 36*  GLUCOSE 117* 118* 127* 143*  BUN 7 7 9 11   CREATININE 0.50 0.42* 0.43* 0.50  CALCIUM 7.8* 7.9* 8.3* 8.2*   GFR Estimated Creatinine Clearance: 107.4 mL/min (by C-G formula based on SCr of 0.5 mg/dL). Liver Function Tests:  Recent Labs Lab 12/14/16 1946 12/15/16 0221  AST 18 15  ALT 12* 12*  ALKPHOS 116 101  BILITOT 0.7 0.6  PROT 4.6* 5.0*  ALBUMIN 1.8* 2.3*   No results for  input(s): LIPASE, AMYLASE in the last 168 hours. No results for input(s): AMMONIA in the last 168 hours. Coagulation profile  Recent Labs Lab 12/17/16 0355 12/18/16 0423 12/19/16 0542 12/20/16 0812 12/21/16 0512  INR 1.12 1.19 1.12 1.10 1.11    CBC:  Recent Labs Lab 12/14/16 1946 12/15/16 0221 12/18/16 0423 12/21/16 0512  WBC 13.5* 14.0* 18.1* 14.3*  NEUTROABS  --  12.3*  --   --   HGB 11.5* 11.1* 10.3* 10.6*  HCT 35.8* 34.1* 32.7* 33.5*  MCV 92.0 92.4 93.7 95.4  PLT 879* 815* 676* 582*   Cardiac Enzymes: No results for input(s): CKTOTAL, CKMB, CKMBINDEX, TROPONINI in the last 168 hours. BNP: Invalid input(s): POCBNP CBG:  Recent Labs Lab 12/18/16 2318  GLUCAP 172*   D-Dimer No results for input(s): DDIMER in the last 72 hours. Hgb A1c No results for input(s): HGBA1C in the last 72 hours. Lipid Profile No results for input(s): CHOL, HDL, LDLCALC, TRIG, CHOLHDL, LDLDIRECT in the last 72 hours. Thyroid function studies No results for input(s): TSH, T4TOTAL, T3FREE, THYROIDAB in the last 72 hours.  Invalid input(s): FREET3 Anemia work up No results for  input(s): VITAMINB12, FOLATE, FERRITIN, TIBC, IRON, RETICCTPCT in the last 72 hours. Microbiology Recent Results (from the past 240 hour(s))  Culture, blood (Routine X 2) w Reflex to ID Panel     Status: None   Collection Time: 12/14/16  7:45 PM  Result Value Ref Range Status   Specimen Description BLOOD LEFT WRIST  Final   Special Requests BOTTLES DRAWN AEROBIC AND ANAEROBIC 5CC  Final   Culture   Final    NO GROWTH 5 DAYS Performed at [facility]    Report Status 12/19/2016 FINAL  Final  MRSA PCR Screening     Status: None   Collection Time: 12/14/16 10:52 PM  Result Value Ref Range Status   MRSA by PCR NEGATIVE NEGATIVE Final    Comment:        The GeneXpert MRSA Assay (FDA approved for NASAL specimens only), is one component of a comprehensive MRSA colonization surveillance program. It is not intended to diagnose MRSA infection nor to guide or monitor treatment for MRSA infections.   Culture, sputum-assessment     Status: None   Collection Time: 12/15/16 12:50 AM  Result Value Ref Range Status   Specimen Description SPU  Final   Special Requests Immunocompromised  Final   Sputum evaluation THIS SPECIMEN IS ACCEPTABLE FOR SPUTUM CULTURE  Final   Report Status 12/15/2016 FINAL  Final  Culture, respiratory (NON-Expectorated)     Status: None   Collection Time: 12/15/16 12:50 AM  Result Value Ref Range Status   Specimen Description SPU  Final   Special Requests Immunocompromised Reflexed from [id number]  Final   Gram Stain   Final    MODERATE WBC PRESENT, PREDOMINANTLY PMN FEW SQUAMOUS EPITHELIAL CELLS PRESENT FEW GRAM POSITIVE COCCI IN PAIRS FEW GRAM POSITIVE COCCOBACILLUS    Culture   Final    Consistent with normal respiratory flora. Performed at [facility]    Report Status 12/17/2016 FINAL  Final  Culture, blood (Routine X 2) w Reflex to ID Panel     Status: None   Collection Time: 12/15/16  2:25 AM  Result Value Ref Range Status   Specimen  Description BLOOD RIGHT WRIST  Final   Special Requests BOTTLES DRAWN AEROBIC ONLY 5CC  Final   Culture   Final    NO GROWTH 5 DAYS Performed  at [facility]    Report Status 12/20/2016 FINAL  Final      Studies:  No results found.  Assessment: 30 y.o. African-American female, with refractory classical Hodgkin lymphoma, right lung collapse secondary to lymphoma, SVC syndrome  1. 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 Right side obstructive pneumonia 4. Hypoxic respiratory failure, secondary to #2 and #3 5. HTN  6. ? right brachiocephalic vein DVT, on lovenox  7. Anemia, probably related to her HL  8. Thrombocytosis, likely reactive to HL   Plan:  -she is scheduled to complete radiation on Thursday 12/28, I'll confirm with radiation oncology PA Bryson Ha, Dr. Tammi Klippel is out of office  -I briefly discussed with Dr. Isidore Moos who prefers pt not to have concurrent chemo and RT, and I agree with it due to the potential significant side effects  -Due to her SVC syndrome and bulky disease, I recommend pt to start chemo Brentuximab and bendamustine (recommended by Dr. Irene Limbo) this Friday and Saturday as inpt, or 1/2 and 1/3 as outpt if I can get her in (our infusion scheduled is over-booked for next week).  -The patient is very reluctant to stay for extra 2 days in the hospital for chemotherapy this week, and strongly prefers to return to our clinic early next week to start chemotherapy. She states she will be compliant with treatment, she voiced good understanding of her disease status, and importance of compliance. -I spoke with IR Dr. Barbie Banner today, who kindly reviewed her CT scan, and does not feel she has right brachiocephalic and IJ veinDVT. However her port is not in good position. Due to the SVC syndrome, we decided to remove her port and put a new in on left tomorrow or Thursday -I recommend  Doppler of the right IJ and right brachiocephalic vein to confirm if she has DVT or not, my suspicion is low, if the Toprol negative, okay to stop lovenox.  -I will see her tomorrow and finalize her chemo schedule  -please check uric acid, and magnesium, phos, to rule out TLS    Truitt Merle, MD 12/21/2016  4:43 PM

## 2016-12-21 NOTE — Progress Notes (Signed)
Referring Physician(s): [id number]  Supervising Physician: Arne Cleveland  Patient Status:  [facility] - In-pt  Chief Complaint:  Lymphoma, malpositioned port a cath  Subjective: Pt familiar to IR service from prior right axillary LN bx on 03/16/16 and rt chest wall PAC on 05/17/16. She has hx of Hodgkin's lymphoma, recent PNA. She was recently admitted with dyspnea , rt facial/arm/leg swelling. Subsequent imaging revealed progression of rt lung mass/left lung nodules/mediastinal/upper abd adenopathy, nonopacification of rt brachiocephalic vein/rt IJ most likely from compressive tumor burden. Her port tip was noted to be looped in neck and likely terminating in rt IJ vein. Request now received for port revision/manipulation vs new placement.  Past Medical History:  Diagnosis Date  . ARDS (adult respiratory distress syndrome) ([place])   . Asthma   . Hodgkin lymphoma ([place])   . Hypertension    Past Surgical History:  Procedure Laterality Date  . AXILLARY LYMPH NODE BIOPSY Right 03/19/2016   Procedure: AXILLARY LYMPH NODE BIOPSY;  Surgeon: Armandina Gemma, MD;  Location: [facility] ORS;  Service: General;  Laterality: Right;  [name] Kitchen VIDEO BRONCHOSCOPY Bilateral 11/26/2016   Procedure: VIDEO BRONCHOSCOPY WITH FLUORO;  Surgeon: Rigoberto Noel, MD;  Location: [facility] ENDOSCOPY;  Service: Cardiopulmonary;  Laterality: Bilateral;      Allergies: Patient has no known allergies.  Medications: Prior to Admission medications   Medication Sig Start Date End Date Taking? Authorizing Provider  dexamethasone (DECADRON) 4 MG tablet 2 tab (8mg ) twice a day for 3 days starting on day of chemotherapy 06/15/16  Yes Brunetta Genera, MD  levalbuterol [facility] HFA) 45 MCG/ACT inhaler Inhale 2 puffs into the lungs every 6 (six) hours as needed for wheezing. 03/23/16  Yes Erick Colace, NP  lidocaine-prilocaine (EMLA) cream Apply 1 application topically as needed (numbing).    Yes Historical Provider, MD  loratadine (CLARITIN) 10  MG tablet Take 1 tablet (10 mg total) by mouth daily. For up to 5 days following Neulasta injection. 06/15/16  Yes Brunetta Genera, MD  ondansetron (ZOFRAN) 8 MG tablet Take 1 tablet (8 mg total) by mouth every 8 (eight) hours as needed for nausea (start on the 3rd day after chemotherapy). 05/03/16  Yes Gautam Juleen China, MD  prochlorperazine (COMPAZINE) 10 MG tablet Take 1 tablet (10 mg total) by mouth every 6 (six) hours as needed for nausea or vomiting. 05/03/16  Yes Gautam Juleen China, MD  metoprolol (LOPRESSOR) 50 MG tablet Take 1 tablet (50 mg total) by mouth 2 (two) times daily. Patient not taking: Reported on 11/23/2016 04/26/16   Verlee Monte, MD     Vital Signs: BP 136/79 (BP Location: Right Arm)   Pulse 91   Temp 98 F (36.7 C) (Oral)   Resp 18   Ht 5\' 7"  (1.702 m)   Wt 164 lb 3.9 oz (74.5 kg)   LMP 10/13/2016 (Approximate) Comment: patient was shielded  SpO2 94%   BMI 25.72 kg/m   Physical Exam awake /alert; edema noted in rt face/RUE; chest- CTA bilat; clean, intact rt chest port site;  heart- tachy but regular rhythm; abd- soft,+BS,NT; LE- not sig edematous  Imaging: Dg Chest 2 View  Result Date: 12/18/2016 CLINICAL DATA:  Worsening shortness of breath x3 weeks, history of Hodgkin's lymphoma, cough, weakness EXAM: CHEST  2 VIEW COMPARISON:  CT chest dated 12/15/2016 FINDINGS: Near complete opacification of the right lung, possibly reflecting a combination of lymphoma and pneumonia, better evaluated on CT. Mild patchy left upper lobe opacity, suspicious for  pneumonia when correlating with CT. Malpositioned right chest port is looped in the neck and likely terminates in the right internal jugular vein. IMPRESSION: Near complete opacification of the right lung, possibly reflecting combination of lymphoma and pneumonia, better evaluated on CT. Mild patchy left upper lobe opacity, suspicious for pneumonia. Malpositioned right chest port is looped in the neck and likely terminates  in the right internal jugular vein, unchanged. Removal is suggested. Electronically Signed   By: Julian Hy M.D.   On: 12/18/2016 12:06    Labs:  CBC:  Recent Labs  12/14/16 1946 12/15/16 0221 12/18/16 0423 12/21/16 0512  WBC 13.5* 14.0* 18.1* 14.3*  HGB 11.5* 11.1* 10.3* 10.6*  HCT 35.8* 34.1* 32.7* 33.5*  PLT 879* 815* 676* 582*    COAGS:  Recent Labs  03/16/16 2003 05/17/16 0945  12/18/16 0423 12/19/16 0542 12/20/16 0812 12/21/16 0512  INR 1.21 1.08  < > 1.19 1.12 1.10 1.11  APTT 31 26  --   --   --   --   --   < > = values in this interval not displayed.  BMP:  Recent Labs  12/14/16 1946 12/15/16 0221 12/18/16 0423 12/21/16 0512  NA 136 135 138 139  K 4.1 4.1 4.4 4.1  CL 95* 95* 97* 96*  CO2 34* 33* 35* 36*  GLUCOSE 117* 118* 127* 143*  BUN 7 7 9 11   CALCIUM 7.8* 7.9* 8.3* 8.2*  CREATININE 0.50 0.42* 0.43* 0.50  GFRNONAA >60 >60 >60 >60  GFRAA >60 >60 >60 >60    LIVER FUNCTION TESTS:  Recent Labs  10/19/16 0905 11/23/16 0914 12/14/16 1946 12/15/16 0221  BILITOT 0.58 0.6 0.7 0.6  AST 14 19 18 15   ALT 10 9* 12* 12*  ALKPHOS 98 59 116 101  PROT 6.6 5.0* 4.6* 5.0*  ALBUMIN 2.9* 2.1* 1.8* 2.3*    Assessment and Plan: Pt with hx of Hodgkin's lymphoma, recent PNA. She was recently admitted with dyspnea , rt facial/arm/leg swelling. Subsequent imaging revealed progression of rt lung mass/left lung nodules/mediastinal/upper abd adenopathy, nonopacification of rt brachiocephalic vein/rt IJ most likely from compressive tumor burden. She was started on lovenox due to concern for thrombus. Her port tip was noted to be looped in neck and likely terminating in rt IJ vein. Request now received for port revision/manipulation vs new placement. Case was reviewed by Drs. Hoss/Hassell. Plan is for attempt at rt port manipulation/revision on 12/27 or placement of new left port if necessary. Risks and benefits discussed with the patient including, but not  limited to bleeding, infection, pneumothorax, or fibrin sheath development and need for additional procedures. All of the patient's questions were answered, patient is agreeable to proceed. Consent signed and in chart.      Electronically Signed: D. Rowe Robert 12/21/2016, 2:41 PM   I spent a total of 20 minutes at the the patient's bedside AND on the patient's hospital floor or unit, greater than 50% of which was counseling/coordinating care for port a cath revision/possible new placement    Patient ID: Heidi [name], female   DOB: 03/30/1986, 30 y.o.   MRN: [id number]

## 2016-12-21 NOTE — Progress Notes (Signed)
PROGRESS NOTE    Heidi [name]  [id number] DOB: [birth year] DOA: 12/14/2016 PCP: Maren Reamer, MD   Brief Narrative: 30 y.o. female with medical history significant for Hodgkin lymphoma, presenting to the emergency department with 1 week of progressive dyspnea and right sided edema. She had admitted to the hospital approximately 3 weeks ago for acute hypoxic respiratory failure suspected secondary to CAP. She left AGAINST MEDICAL ADVICE on 12/04/2016 and, while she initially did okay back at home, she has developed recurrence in severe dyspnea over the past week.EKG features a sinus tachycardia with rate 123 and chest x-ray is notable for complete opacification of the right hemithorax, likely diffuse consolidation. 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 C/o: Dyspnea; swelling of right face, RUE, RLE (on admission).  Assessment & Plan:  #Acute hypoxic respiratory failure most likely due to progression of lymphoma vs pneumonia -CT chest showed right lung complete opacity and increase in the site of left lung nodules represent neoplastic disease versus pneumonia. -Initially treated with IV vancomycin and cefepime for presumed healthcare associated pneumonia. I think patient's pulmonary findings is most likely related with lymphoma that may have pulmonary infiltrates. Currently on Levaquin. -Continue to follow up culture results. No growth so far. -Currently on Solu-Medrol 60 mg every 6 hours started by pulmonologist. Since patient's symptoms are mostly related with lymphoma I will taper Solu-Medrol to once daily. Needs gradual tapering. -Oncology and radiation oncology consult service evaluated the patient. She is very noncompliant with the outpatient follow-up. Started radiation therapy since 12/21. -I spoke with the radiation therapy team, discussed with [facility], plan to continue radiation therapy for now. Also discussed with the medicine oncologist Dr. Burr Medico regarding further plan. As per  prior oncology note, there was discussion about possible starting chemotherapy inpatient. Dr. Burr Medico will evaluate the patient and provide further recommendation. -No improvement in patient's hypoxia. She has dyspnea on exertion.  -Continue bronchodilators and breathing treatment.  -Requiring 3-4 L of oxygen.  # Possible right brachiocephalic vein DVT: Reviewed the results of CT chest which was consistent with nonopacification of right upper brachiocephalic vein and right IJ concerning for occlusion. Unable to rule out if this is thrombus given history of malignancy. I discussed with Dr. Irene Limbo on 12/21, patient's oncologist who recommended to start therapeutic Lovenox for systemic anticoagulation. He reported that he may be able to switch to oral anticoagulation when he sees patient in the clinic. -Started subcutaneous therapeutic Lovenox since December 21. -The chest x-ray showed coiled portacatheter. I consulted interventional radiology to evaluate for possible removal. The Port-A-Cath may be removed tomorrow. I will defer to oncology if patient needs new Port-A-Cath. I discussed this with Dr. Burr Medico.  #Hodgkins lymphoma: Follow-up medicine oncology and radiation oncologist evaluation and recommendation. As discussed above.  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 Treatment including radiation therapy as discussed above.  # Hypertensive urgency and Sinus tachycardia: Improving with labetalol oral 3 times a day. Continue current medication.   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 Disposition Plan:Likely discharge to home in  2-3 days.   Consultants:   Pulmonologist  Medicine oncologist  Radiation therapy.  Procedures:None  Antimicrobials: IV vancomycin and cefepime from 12/19 to 12/24.  Started levaquin on 12/24  Subjective:Patient was seen and examined at bedside. No new event. Has a stable shortness of breath and exertional dyspnea. Denied fever, chills, headache, chest pain, nausea or vomiting.  Objective: Vitals:   12/20/16 1336 12/20/16 2219 12/21/16 0432 12/21/16 1259  BP: (!) 160/63 (!) 170/73 138/65 136/79  Pulse: 75 83 76 91  Resp: 20 20 18 18   Temp: 98.7 F (37.1 C) 98.3 F (36.8 C) 98.4 F (36.9 C) 98 F (36.7 C)  TempSrc: Oral Oral Oral Oral  SpO2: 95% 95% 96% 94%  Weight:      Height:        Intake/Output Summary (Last 24 hours) at 12/21/16 1420 Last data filed at 12/20/16 1800  Gross per 24 hour  Intake              200 ml  Output                0 ml  Net              200 ml   Filed Weights   12/14/16 1900 12/14/16 2255  Weight: 65.8 kg (145 lb) 74.5 kg (164 lb 3.9 oz)    Examination:  General exam: Not in distress, right-sided facial swelling unchanged. Eyes: PERRLA  Respiratory system: No breathing sounds heard on the right side, and left-sided clear. No accessory muscle use. Cardiovascular system: Regular rate rhythm, S1 and S2 normal. No murmur. No pedal edema. Gastrointestinal system: Abdomen soft, nontender. Bowel sound positive. Central nervous system: Alert and oriented. No focal neurological deficits. Extremities: Symmetric 5 x 5 power. Skin: No rashes, lesions or ulcers Psychiatry: Judgement and insight appear normal. Mood & affect appropriate.     Data Reviewed: I have personally reviewed following labs and imaging studies  CBC:  Recent Labs Lab 12/14/16 1946 12/15/16 0221 12/18/16 0423 12/21/16 0512  WBC  13.5* 14.0* 18.1* 14.3*  NEUTROABS  --  12.3*  --   --   HGB 11.5* 11.1* 10.3* 10.6*  HCT 35.8* 34.1* 32.7* 33.5*  MCV 92.0 92.4 93.7 95.4  PLT 879* 815* 676* 0000000*   Basic Metabolic Panel:  Recent Labs Lab 12/14/16 1946 12/15/16 0221 12/18/16 0423 12/21/16 0512  NA 136 135 138 139  K 4.1 4.1 4.4 4.1  CL 95* 95* 97* 96*  CO2 34* 33* 35* 36*  GLUCOSE 117* 118* 127* 143*  BUN 7 7 9 11   CREATININE 0.50 0.42* 0.43* 0.50  CALCIUM 7.8* 7.9* 8.3* 8.2*   GFR: Estimated Creatinine Clearance: 107.4 mL/min (by C-G formula based on SCr of 0.5 mg/dL). Liver Function Tests:  Recent Labs Lab 12/14/16 1946 12/15/16 0221  AST 18 15  ALT 12* 12*  ALKPHOS 116 101  BILITOT 0.7 0.6  PROT 4.6* 5.0*  ALBUMIN 1.8* 2.3*   No results for input(s): LIPASE, AMYLASE in the last 168 hours. No results for input(s): AMMONIA in the last 168 hours. Coagulation Profile:  Recent Labs Lab 12/17/16 0355 12/18/16 0423 12/19/16 0542 12/20/16 0812 12/21/16 0512  INR 1.12 1.19 1.12 1.10 1.11   Cardiac Enzymes: No results for input(s): CKTOTAL, CKMB, CKMBINDEX, TROPONINI in the last 168 hours. BNP (last 3 results) No results for input(s): PROBNP in the last 8760 hours. HbA1C: No results for input(s): HGBA1C in the last 72 hours. CBG:  Recent  Labs Lab 12/18/16 2318  GLUCAP 172*   Lipid Profile: No results for input(s): CHOL, HDL, LDLCALC, TRIG, CHOLHDL, LDLDIRECT in the last 72 hours. Thyroid Function Tests: No results for input(s): TSH, T4TOTAL, FREET4, T3FREE, THYROIDAB in the last 72 hours. Anemia Panel: No results for input(s): VITAMINB12, FOLATE, FERRITIN, TIBC, IRON, RETICCTPCT in the last 72 hours. Sepsis Labs:  Recent Labs Lab 12/14/16 2255 12/15/16 0221 12/17/16 0355  PROCALCITON  --   --  <0.10  LATICACIDVEN 2.2* 1.6  --     Recent Results (from the past 240 hour(s))  Culture, blood (Routine X 2) w Reflex to ID Panel     Status: None   Collection Time: 12/14/16  7:45  PM  Result Value Ref Range Status   Specimen Description BLOOD LEFT WRIST  Final   Special Requests BOTTLES DRAWN AEROBIC AND ANAEROBIC 5CC  Final   Culture   Final    NO GROWTH 5 DAYS Performed at [facility]    Report Status 12/19/2016 FINAL  Final  MRSA PCR Screening     Status: None   Collection Time: 12/14/16 10:52 PM  Result Value Ref Range Status   MRSA by PCR NEGATIVE NEGATIVE Final    Comment:        The GeneXpert MRSA Assay (FDA approved for NASAL specimens only), is one component of a comprehensive MRSA colonization surveillance program. It is not intended to diagnose MRSA infection nor to guide or monitor treatment for MRSA infections.   Culture, sputum-assessment     Status: None   Collection Time: 12/15/16 12:50 AM  Result Value Ref Range Status   Specimen Description SPU  Final   Special Requests Immunocompromised  Final   Sputum evaluation THIS SPECIMEN IS ACCEPTABLE FOR SPUTUM CULTURE  Final   Report Status 12/15/2016 FINAL  Final  Culture, respiratory (NON-Expectorated)     Status: None   Collection Time: 12/15/16 12:50 AM  Result Value Ref Range Status   Specimen Description SPU  Final   Special Requests Immunocompromised Reflexed from [id number]  Final   Gram Stain   Final    MODERATE WBC PRESENT, PREDOMINANTLY PMN FEW SQUAMOUS EPITHELIAL CELLS PRESENT FEW GRAM POSITIVE COCCI IN PAIRS FEW GRAM POSITIVE COCCOBACILLUS    Culture   Final    Consistent with normal respiratory flora. Performed at [facility]    Report Status 12/17/2016 FINAL  Final  Culture, blood (Routine X 2) w Reflex to ID Panel     Status: None   Collection Time: 12/15/16  2:25 AM  Result Value Ref Range Status   Specimen Description BLOOD RIGHT WRIST  Final   Special Requests BOTTLES DRAWN AEROBIC ONLY 5CC  Final   Culture   Final    NO GROWTH 5 DAYS Performed at [facility]    Report Status 12/20/2016 FINAL  Final         Radiology Studies: No  results found.      Scheduled Meds: . enoxaparin (LOVENOX) injection  1 mg/kg Subcutaneous Q12H  . labetalol  200 mg Oral TID  . levofloxacin  750 mg Oral Daily  . mouth rinse  15 mL Mouth Rinse BID  . methylPREDNISolone (SOLU-MEDROL) injection  60 mg Intravenous Q6H  . pantoprazole  40 mg Oral Daily  . sodium chloride  1,000 mL Intravenous Once  . sodium chloride flush  3 mL Intravenous Q12H   Continuous Infusions:    LOS: 7 days    Heidi [name]  Carolin Sicks, MD [facility] Pager [phone]  If 7PM-7AM, please contact night-coverage [email or web address] Password [email or web address] 12/21/2016, 2:20 PM

## 2016-12-22 ENCOUNTER — Inpatient Hospital Stay (HOSPITAL_COMMUNITY): Payer: Medicaid Other

## 2016-12-22 ENCOUNTER — Ambulatory Visit: Admit: 2016-12-22 | Discharge: 2016-12-22 | Disposition: A | Discharge status: Home / Self Care | Payer: Medicaid Other

## 2016-12-22 ENCOUNTER — Telehealth: Payer: Self-pay | Admitting: *Deleted

## 2016-12-22 ENCOUNTER — Encounter (HOSPITAL_COMMUNITY): Payer: Self-pay | Admitting: Interventional Radiology

## 2016-12-22 DIAGNOSIS — I82621 Acute embolism and thrombosis of deep veins of right upper extremity: Secondary | ICD-10-CM

## 2016-12-22 DIAGNOSIS — R0600 Dyspnea, unspecified: Secondary | ICD-10-CM

## 2016-12-22 DIAGNOSIS — M7989 Other specified soft tissue disorders: Secondary | ICD-10-CM

## 2016-12-22 HISTORY — PX: IR GENERIC HISTORICAL: IMG1180011

## 2016-12-22 LAB — CBC WITH DIFFERENTIAL/PLATELET
BASOS ABS: 0 10*3/uL (ref 0.0–0.1)
Basophils Relative: 0 %
Eosinophils Absolute: 0 10*3/uL (ref 0.0–0.7)
Eosinophils Relative: 0 %
HEMATOCRIT: 33.1 % — AB (ref 36.0–46.0)
HEMOGLOBIN: 10.5 g/dL — AB (ref 12.0–15.0)
LYMPHS PCT: 1 %
Lymphs Abs: 0.1 10*3/uL — ABNORMAL LOW (ref 0.7–4.0)
MCH: 30.5 pg (ref 26.0–34.0)
MCHC: 31.7 g/dL (ref 30.0–36.0)
MCV: 96.2 fL (ref 78.0–100.0)
Monocytes Absolute: 0.2 10*3/uL (ref 0.1–1.0)
Monocytes Relative: 1 %
NEUTROS ABS: 12.2 10*3/uL — AB (ref 1.7–7.7)
NEUTROS PCT: 98 %
Platelets: 508 10*3/uL — ABNORMAL HIGH (ref 150–400)
RBC: 3.44 MIL/uL — AB (ref 3.87–5.11)
RDW: 16.4 % — ABNORMAL HIGH (ref 11.5–15.5)
WBC: 12.5 10*3/uL — AB (ref 4.0–10.5)

## 2016-12-22 LAB — URIC ACID: Uric Acid, Serum: 2.6 mg/dL (ref 2.3–6.6)

## 2016-12-22 LAB — MAGNESIUM: Magnesium: 2.1 mg/dL (ref 1.7–2.4)

## 2016-12-22 LAB — PROTIME-INR
INR: 1.12
Prothrombin Time: 14.5 seconds (ref 11.4–15.2)

## 2016-12-22 LAB — PHOSPHORUS: Phosphorus: 3.6 mg/dL (ref 2.5–4.6)

## 2016-12-22 MED ORDER — LIDOCAINE-EPINEPHRINE (PF) 2 %-1:200000 IJ SOLN
INTRAMUSCULAR | Status: AC | PRN
  Administered 2016-12-22: 10 mL
  Administered 2016-12-22: 20 mL

## 2016-12-22 MED ORDER — LIDOCAINE-EPINEPHRINE (PF) 2 %-1:200000 IJ SOLN
INTRAMUSCULAR | Status: AC
  Filled 2016-12-22: qty 20

## 2016-12-22 MED ORDER — FENTANYL CITRATE (PF) 100 MCG/2ML IJ SOLN
INTRAMUSCULAR | Status: AC
  Filled 2016-12-22: qty 4

## 2016-12-22 MED ORDER — IOPAMIDOL (ISOVUE-300) INJECTION 61%
10.0000 mL | Freq: Once | INTRAVENOUS | Status: AC | PRN
  Administered 2016-12-22: 10 mL via INTRAVENOUS

## 2016-12-22 MED ORDER — IOPAMIDOL (ISOVUE-300) INJECTION 61%
INTRAVENOUS | Status: AC
  Administered 2016-12-22: 10 mL via INTRAVENOUS
  Filled 2016-12-22: qty 50

## 2016-12-22 MED ORDER — MIDAZOLAM HCL 2 MG/2ML IJ SOLN
INTRAMUSCULAR | Status: AC
  Filled 2016-12-22: qty 8

## 2016-12-22 MED ORDER — FENTANYL CITRATE (PF) 100 MCG/2ML IJ SOLN
INTRAMUSCULAR | Status: AC | PRN
  Administered 2016-12-22: 50 ug via INTRAVENOUS
  Administered 2016-12-22 (×2): 25 ug via INTRAVENOUS

## 2016-12-22 MED ORDER — SODIUM CHLORIDE 0.9% FLUSH: 10.0000 mL | INTRAVENOUS | Status: DC | PRN

## 2016-12-22 MED ORDER — MIDAZOLAM HCL 2 MG/2ML IJ SOLN
INTRAMUSCULAR | Status: AC | PRN
  Administered 2016-12-22 (×3): 1 mg via INTRAVENOUS
  Administered 2016-12-22: 2 mg via INTRAVENOUS

## 2016-12-22 MED ORDER — VANCOMYCIN HCL IN DEXTROSE 1-5 GM/200ML-% IV SOLN
1000.0000 mg | INTRAVENOUS | Status: AC
  Administered 2016-12-22: 1000 mg via INTRAVENOUS
  Filled 2016-12-22: qty 200

## 2016-12-22 NOTE — Telephone Encounter (Signed)
Per LOS I have scheduled appts and notified the scheduler 

## 2016-12-22 NOTE — Progress Notes (Signed)
**  Preliminary report by tech**  Right upper extremity venous duplex complete. There is evidence of deep vein thrombosis involving the internal jugular vein of the right upper extremity. There is no evidence of superficial vein thrombosis. Results were given to the patient's nurse, Shirlee Limerick.  12/22/16 9:42 AM Heidi [name] RVT

## 2016-12-22 NOTE — Progress Notes (Signed)
ANTICOAGULATION CONSULT NOTE -   Pharmacy Consult for Lovenox Indication: VTE treatment  No Known Allergies  Patient Measurements: Height: 5\' 7"  (170.2 cm) Weight: 164 lb 3.9 oz (74.5 kg) IBW/kg (Calculated) : 61.6  Vital Signs: Temp: 98.6 F (37 C) (12/27 0520) Temp Source: Oral (12/27 0520) BP: 150/75 (12/27 0520) Pulse Rate: 93 (12/27 0520)  Labs:  Recent Labs  12/20/16 0812 12/21/16 0512 12/22/16 0509  HGB  --  10.6* 10.5*  HCT  --  33.5* 33.1*  PLT  --  582* 508*  LABPROT 14.3 14.3 14.5  INR 1.10 1.11 1.12  CREATININE  --  0.50  --     Estimated Creatinine Clearance: 107.4 mL/min (by C-G formula based on SCr of 0.5 mg/dL).   Medical History: Past Medical History:  Diagnosis Date  . ARDS (adult respiratory distress syndrome) ([place])   . Asthma   . Hodgkin lymphoma ([place])   . Hypertension     Assessment: 73 yoF admitted with respiratory failure 2/2 progressive Hodgkins lymphoma with lung involvement.  Pt also found to have possible right brachiocephalic vein DVT felt associated with right PAC.  MD and Oncologist recommend full dose lovenox for now with possible consideration of oral anticoagulation at follow-up visit outpatient.  Pharmacy consulted to assist with dosing.   12/22/2016  CrCl >100 ml/min  CBC:  Hgb low but stable, plts elevated.   No anticoagulation noted PTA  CT reviewed by IR MD and does not see convincing evidence for DVT; however PAC is in poor position and will need to be replaced.  Lovenox held starting with last night's dose  Repeating doppler on R arm to confirm DVT - if negative can stop lovenox  Goal of Therapy:  Anti-Xa level 0.6-1 units/ml 4hrs after LMWH dose given Monitor platelets by anticoagulation protocol: Yes   Plan:   Lovenox on hold for port replacement later today  F/u doppler results and resume or stop Lovenox as appropriate  Daily CBC, renal function  Monitor for signs / symptoms of bleeding  Reuel Boom, PharmD, BCPS Pager: [phone] 12/22/2016, 9:16 AM

## 2016-12-22 NOTE — Progress Notes (Signed)
PROGRESS NOTE    Heidi [name]  [id number] DOB: 1986/12/05 DOA: 12/14/2016 PCP: Maren Reamer, MD   Brief Narrative: 30 y.o. female with medical history significant for Hodgkin lymphoma, presenting to the emergency department with 1 week of progressive dyspnea and right sided edema. She had admitted to the hospital approximately 3 weeks ago for acute hypoxic respiratory failure suspected secondary to CAP. She left AGAINST MEDICAL ADVICE on 12/04/2016 and, while she initially did okay back at home, she has developed recurrence in severe dyspnea over the past week.EKG features a sinus tachycardia with rate 123 and chest x-ray is notable for complete opacification of the right hemithorax, likely diffuse consolidation. 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 C/o: Dyspnea; swelling of right face, RUE, RLE (on admission).  Assessment & Plan: #Acute hypoxic respiratory failure most likely due to progression of lymphoma vs pneumonia -CT chest showed right lung complete opacity and increase in the site of left lung nodules represent neoplastic disease versus pneumonia. -Initially treated with IV vancomycin and cefepime for presumed healthcare associated pneumonia. I think patient's pulmonary findings is most likely related with lymphoma that may have pulmonary infiltrates. Currently on Levaquin; planing to treat for another 3 days to complete abx therapy.. -Continue to follow up culture results. No growth so far. -Currently on Solu-Medrol 60 mg every 6 hours started by pulmonologist. Since patient's symptoms are mostly related with lymphoma I will taper Solu-Medrol to once daily. With anticipated gradual steroids tapering. -Oncology and radiation oncology consult service evaluated the patient. She is very noncompliant with the outpatient follow-up. Started radiation therapy since 12/21. -I spoke with the radiation therapy team, discussed with Shona [facility], plan to continue radiation therapy for now. Also  discussed with the medicine oncologist Dr. Burr Medico regarding further plan. As per prior oncology note, there was discussion about possible starting chemotherapy inpatient; but patient not willing to stay hospitalized for that; ill like to go home and return to the clinic for infusions. -No improvement in patient's hypoxia. She has dyspnea on exertion.  -Continue bronchodilators and breathing treatment.  -Requiring 3-4 L of oxygen.  # right IJ vein DVT: Reviewed the results of CT chest which was consistent with nonopacification of right upper brachiocephalic vein and right IJ concerning for occlusion. Unable to rule out if this is thrombus given history of malignancy. I discussed with Dr. Irene Limbo on 12/21, patient's oncologist who recommended to start therapeutic Lovenox for systemic anticoagulation. He reported that he may be able to switch to oral anticoagulation when he sees patient in the clinic. -Started on subcutaneous therapeutic Lovenox since December 21. -The chest x-ray showed coiled portacatheter.  -IR consulted and right IJ discontinued -left IJ placed on 12/27 afternoon  -LE duplex confirm right IJ DVT  #Hodgkins lymphoma: Follow-up medicine oncology and radiation oncologist evaluation and recommendation. As discussed above.  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 Treatment including radiation therapy as discussed above.  # Hypertensive urgency and Sinus tachycardia: Improving with labetalol oral 3 times a day. Continue current medication.   DVT prophylaxis: Systemic anticoagulation Code Status:Full code   Family Communication: No family present at bedside. Disposition Plan:Likely discharge to home in 2-3 days.   Consultants:   Pulmonologist  Medicine oncologist  Radiation therapy.  Procedures:None  Antimicrobials: IV vancomycin and cefepime from 12/19 to  12/24.  Started levaquin on 12/24  Subjective: Denies fever, chills, headache, chest pain, nausea or vomiting. Stable  SOB. Complaining of been hungry.   Objective: Vitals:   12/22/16 1645 12/22/16 1650 12/22/16 1727 12/22/16 2046  BP: (!) 163/94 (!) 152/85 (!) 148/98 124/90  Pulse: 96 95 91 95  Resp: 19 19 (!) 22 20  Temp:   97.8 F (36.6 C) 98.9 F (37.2 C)  TempSrc:   Oral Oral  SpO2: (!) 89% 90% 93% 94%  Weight:      Height:        Intake/Output Summary (Last 24 hours) at 12/22/16 2248 Last data filed at 12/22/16 1918  Gross per 24 hour  Intake                0 ml  Output              200 ml  Net             -200 ml   Filed Weights   12/14/16 1900 12/14/16 2255  Weight: 65.8 kg (145 lb) 74.5 kg (164 lb 3.9 oz)    Examination:  General exam: Not in distress, complaining of been hungry and needing to eat. Right-sided facial swelling unchanged. Eyes: PERRLA, no icterus Respiratory system: No breathing sounds heard on the right side, and left-sided clear. No accessory muscle use. Cardiovascular system: Regular rate rhythm, S1 and S2 normal. No murmur. No pedal edema. Gastrointestinal system: Abdomen soft, nontender. Bowel sound positive. Central nervous system: Alert and oriented. No focal neurological deficits. Extremities: Symmetric 5 x 5 power. RUE swelling and pain in upper aspect of her arm. Skin: No rashes, lesions or ulcers Psychiatry: Judgement and insight appear normal. Mood & affect appropriate.     Data Reviewed: I have personally reviewed following labs and imaging studies  CBC:  Recent Labs Lab 12/18/16 0423 12/21/16 0512 12/22/16 0509  WBC 18.1* 14.3* 12.5*  NEUTROABS  --   --  12.2*  HGB 10.3* 10.6* 10.5*  HCT 32.7* 33.5* 33.1*  MCV 93.7 95.4 96.2  PLT 676* 582* 123XX123*   Basic Metabolic Panel:  Recent Labs Lab 12/18/16 0423 12/21/16 0512 12/22/16 0509  NA 138 139  --   K 4.4 4.1  --   CL 97* 96*  --   CO2 35* 36*  --   GLUCOSE 127*  143*  --   BUN 9 11  --   CREATININE 0.43* 0.50  --   CALCIUM 8.3* 8.2*  --   MG  --   --  2.1  PHOS  --   --  3.6   GFR: Estimated Creatinine Clearance: 107.4 mL/min (by C-G formula based on SCr of 0.5 mg/dL).  Coagulation Profile:  Recent Labs Lab 12/18/16 0423 12/19/16 0542 12/20/16 0812 12/21/16 0512 12/22/16 0509  INR 1.19 1.12 1.10 1.11 1.12   CBG:  Recent Labs Lab 12/18/16 2318  GLUCAP 172*   Sepsis Labs:  Recent Labs Lab 12/17/16 0355  PROCALCITON <0.10    Recent Results (from the past 240 hour(s))  Culture, blood (Routine X 2) w Reflex to ID Panel     Status: None   Collection Time: 12/14/16  7:45 PM  Result Value Ref Range Status   Specimen Description BLOOD LEFT WRIST  Final   Special Requests BOTTLES DRAWN AEROBIC AND ANAEROBIC 5CC  Final   Culture   Final    NO GROWTH 5 DAYS Performed at [facility]    Report Status 12/19/2016 FINAL  Final  MRSA PCR Screening     Status: None   Collection  Time: 12/14/16 10:52 PM  Result Value Ref Range Status   MRSA by PCR NEGATIVE NEGATIVE Final    Comment:        The GeneXpert MRSA Assay (FDA approved for NASAL specimens only), is one component of a comprehensive MRSA colonization surveillance program. It is not intended to diagnose MRSA infection nor to guide or monitor treatment for MRSA infections.   Culture, sputum-assessment     Status: None   Collection Time: 12/15/16 12:50 AM  Result Value Ref Range Status   Specimen Description SPU  Final   Special Requests Immunocompromised  Final   Sputum evaluation THIS SPECIMEN IS ACCEPTABLE FOR SPUTUM CULTURE  Final   Report Status 12/15/2016 FINAL  Final  Culture, respiratory (NON-Expectorated)     Status: None   Collection Time: 12/15/16 12:50 AM  Result Value Ref Range Status   Specimen Description SPU  Final   Special Requests Immunocompromised Reflexed from [id number]  Final   Gram Stain   Final    MODERATE WBC PRESENT, PREDOMINANTLY  PMN FEW SQUAMOUS EPITHELIAL CELLS PRESENT FEW GRAM POSITIVE COCCI IN PAIRS FEW GRAM POSITIVE COCCOBACILLUS    Culture   Final    Consistent with normal respiratory flora. Performed at [facility]    Report Status 12/17/2016 FINAL  Final  Culture, blood (Routine X 2) w Reflex to ID Panel     Status: None   Collection Time: 12/15/16  2:25 AM  Result Value Ref Range Status   Specimen Description BLOOD RIGHT WRIST  Final   Special Requests BOTTLES DRAWN AEROBIC ONLY 5CC  Final   Culture   Final    NO GROWTH 5 DAYS Performed at [facility]    Report Status 12/20/2016 FINAL  Final    Radiology Studies: Ir Removal Tun Access W/ [place] W/o [place] Mod Sed  Result Date: 12/22/2016 CLINICAL DATA:  Hodgkin's lymphoma. Malpositioned right IJ port catheter with venous occlusion. Recent placement of new left IJ port. EXAM: EXAM TUNNELED PORT CATHETER REMOVAL TECHNIQUE: The procedure, risks (including but not limited to bleeding, infection, organ damage ), benefits, and alternatives were explained to the patient. Questions regarding the procedure were encouraged and answered. The patient understands and consents to the procedure. Intravenous Fentanyl and Versed were administered as conscious sedation during continuous monitoring of the patient's level of consciousness and physiological / cardiorespiratory status by the radiology RN, with a total moderate sedation time of 40 minutes. Overlying skin prepped with chlorhexidine, draped in usual sterile fashion, infiltrated locally with 1% lidocaine. A small incision was made over the scar from previous placement. The port catheter was dissected free from the underlying soft tissues and removed intact. Hemostasis was achieved. The port pocket was closed with deep interrupted and subcuticular continuous 3-0 Monocryl sutures, then covered with Dermabond. The patient tolerated the procedure well. COMPLICATIONS: COMPLICATIONS None immediate IMPRESSION: 1.   Technically successful tunneled Port catheter removal. Electronically Signed   By: Lucrezia Europe M.D.   On: 12/22/2016 17:06   Ir Cv Line Injection  Result Date: 12/22/2016 CLINICAL DATA:  Hodgkin's lymphoma. Right lung mass and mediastinal adenopathy. Right IJ port catheter that I placed 05/17/2016, found on radiography to be malpositioned into the right IJ vein. CT suggests right IJ and brachiocephalic vein occlusion. EXAM: PORT  CATHETER INJECTION UNDER FLUOROSCOPY TECHNIQUE: The procedure, risks (including but not limited to bleeding, infection, organ damage ), benefits, and alternatives were explained to the patient. Questions regarding the procedure were encouraged and  answered. The patient understands and consents to the procedure. Survey fluoroscopic inspection reveals buckling of the intravascular portion of the port catheter into the right IJ vein, the tip retracted up to the level of the confluence of the IJ and subclavian veins. Injection demonstrates patency of the port reservoir and catheter. There is occlusion of the right IJ vein. There is occlusion of the right brachiocephalic vein. The injected contrast drains via small thyrocervical collaterals. No extravasation. IMPRESSION: 1. Malpositioned right IJ port catheter with IJ and right brachiocephalic vein occlusion. Revision is scheduled. Electronically Signed   By: Lucrezia Europe M.D.   On: 12/22/2016 17:05   Ir US Guide Vasc Access Left  Result Date: 12/22/2016 CLINICAL DATA:  Hodgkin's lymphoma. Needs durable long-term venous access for treatment regimen. Malpositioned right IJ port with occluded right IJ and brachiocephalic veins. EXAM: TUNNELED PORT CATHETER PLACEMENT WITH ULTRASOUND AND FLUOROSCOPIC GUIDANCE FLUOROSCOPY TIME:  30 seconds, 5 mGy ANESTHESIA/SEDATION: Intravenous Fentanyl and Versed were administered as conscious sedation during continuous monitoring of the patient's level of consciousness and physiological / cardiorespiratory  status by the radiology RN, with a total moderate sedation time of 40 minutes. TECHNIQUE: The procedure, risks, benefits, and alternatives were explained to the patient. Questions regarding the procedure were encouraged and answered. The patient understands and consents to the procedure. As antibiotic prophylaxis, vancomycin 1 g was ordered pre-procedure and administered intravenously within one hour of incision. Patency of the LEFT IJ vein was confirmed with ultrasound with image documentation. An appropriate skin site was determined. Skin site was marked. Region was prepped using maximum barrier technique including cap and mask, sterile gown, sterile gloves, large sterile sheet, and Chlorhexidine as cutaneous antisepsis. The region was infiltrated locally with 1% lidocaine. Under real-time ultrasound guidance, the LEFT IJ vein was accessed with a 21 gauge micropuncture needle; the needle tip within the vein was confirmed with ultrasound image documentation. Needle was exchanged over a 018 guidewire for transitional dilator which allowed passage of the [facility] wire into the IVC. Over this, the transitional dilator was exchanged for a 5 [place] MPA catheter. A small incision was made on the LEFT anterior chest wall and a subcutaneous pocket fashioned. The power-injectable port was positioned and its catheter tunneled to the LEFT IJ dermatotomy site. The MPA catheter was exchanged over an Amplatz wire for a peel-away sheath, through which the port catheter, which had been trimmed to the appropriate length, was advanced and positioned under fluoroscopy with its tip at the cavoatrial junction. Spot chest radiograph confirms good catheter position and no pneumothorax. The pocket was closed with deep interrupted and subcuticular continuous 3-0 Monocryl sutures. The port was flushed per protocol. The incisions were covered with Dermabond then covered with a sterile dressing. COMPLICATIONS: COMPLICATIONS None immediate  IMPRESSION: Technically successful LEFT IJ power-injectable port catheter placement. Ready for routine use. Electronically Signed   By: Lucrezia Europe M.D.   On: 12/22/2016 17:03   Ir Fluoro Guide Port Insertion Left  Result Date: 12/22/2016 CLINICAL DATA:  Hodgkin's lymphoma. Needs durable long-term venous access for treatment regimen. Malpositioned right IJ port with occluded right IJ and brachiocephalic veins. EXAM: TUNNELED PORT CATHETER PLACEMENT WITH ULTRASOUND AND FLUOROSCOPIC GUIDANCE FLUOROSCOPY TIME:  30 seconds, 5 mGy ANESTHESIA/SEDATION: Intravenous Fentanyl and Versed were administered as conscious sedation during continuous monitoring of the patient's level of consciousness and physiological / cardiorespiratory status by the radiology RN, with a total moderate sedation time of 40 minutes. TECHNIQUE: The procedure, risks, benefits, and alternatives  were explained to the patient. Questions regarding the procedure were encouraged and answered. The patient understands and consents to the procedure. As antibiotic prophylaxis, vancomycin 1 g was ordered pre-procedure and administered intravenously within one hour of incision. Patency of the LEFT IJ vein was confirmed with ultrasound with image documentation. An appropriate skin site was determined. Skin site was marked. Region was prepped using maximum barrier technique including cap and mask, sterile gown, sterile gloves, large sterile sheet, and Chlorhexidine as cutaneous antisepsis. The region was infiltrated locally with 1% lidocaine. Under real-time ultrasound guidance, the LEFT IJ vein was accessed with a 21 gauge micropuncture needle; the needle tip within the vein was confirmed with ultrasound image documentation. Needle was exchanged over a 018 guidewire for transitional dilator which allowed passage of the [facility] wire into the IVC. Over this, the transitional dilator was exchanged for a 5 [place] MPA catheter. A small incision was made on the LEFT  anterior chest wall and a subcutaneous pocket fashioned. The power-injectable port was positioned and its catheter tunneled to the LEFT IJ dermatotomy site. The MPA catheter was exchanged over an Amplatz wire for a peel-away sheath, through which the port catheter, which had been trimmed to the appropriate length, was advanced and positioned under fluoroscopy with its tip at the cavoatrial junction. Spot chest radiograph confirms good catheter position and no pneumothorax. The pocket was closed with deep interrupted and subcuticular continuous 3-0 Monocryl sutures. The port was flushed per protocol. The incisions were covered with Dermabond then covered with a sterile dressing. COMPLICATIONS: COMPLICATIONS None immediate IMPRESSION: Technically successful LEFT IJ power-injectable port catheter placement. Ready for routine use. Electronically Signed   By: Lucrezia Europe M.D.   On: 12/22/2016 17:03    Scheduled Meds: . labetalol  200 mg Oral TID  . levofloxacin  750 mg Oral Daily  . mouth rinse  15 mL Mouth Rinse BID  . methylPREDNISolone (SOLU-MEDROL) injection  60 mg Intravenous Q6H  . pantoprazole  40 mg Oral Daily  . sodium chloride  1,000 mL Intravenous Once  . sodium chloride flush  3 mL Intravenous Q12H   Continuous Infusions:    LOS: 8 days    Barton Dubois, MD [facility] Pager [phone]  If 7PM-7AM, please contact night-coverage [email or web address] Password [facility] 12/22/2016, 10:48 PM

## 2016-12-23 ENCOUNTER — Ambulatory Visit
Admit: 2016-12-23 | Discharge: 2016-12-23 | Disposition: A | Discharge status: Home / Self Care | Payer: Medicaid Other | Attending: Radiation Oncology | Admitting: Radiation Oncology

## 2016-12-23 ENCOUNTER — Encounter: Payer: Self-pay | Admitting: Radiation Oncology

## 2016-12-23 ENCOUNTER — Ambulatory Visit: Admit: 2016-12-23 | Discharge: 2016-12-23 | Disposition: A | Discharge status: Home / Self Care | Payer: Medicaid Other

## 2016-12-23 VITALS — BP 132/62 | HR 77 | Temp 98.6°F | Resp 20

## 2016-12-23 DIAGNOSIS — A419 Sepsis, unspecified organism: Secondary | ICD-10-CM

## 2016-12-23 DIAGNOSIS — J9691 Respiratory failure, unspecified with hypoxia: Secondary | ICD-10-CM

## 2016-12-23 DIAGNOSIS — D72829 Elevated white blood cell count, unspecified: Secondary | ICD-10-CM

## 2016-12-23 DIAGNOSIS — C8128 Mixed cellularity classical Hodgkin lymphoma, lymph nodes of multiple sites: Secondary | ICD-10-CM

## 2016-12-23 DIAGNOSIS — I82C11 Acute embolism and thrombosis of right internal jugular vein: Secondary | ICD-10-CM

## 2016-12-23 DIAGNOSIS — D473 Essential (hemorrhagic) thrombocythemia: Secondary | ICD-10-CM

## 2016-12-23 DIAGNOSIS — D630 Anemia in neoplastic disease: Secondary | ICD-10-CM

## 2016-12-23 DIAGNOSIS — Z86718 Personal history of other venous thrombosis and embolism: Secondary | ICD-10-CM

## 2016-12-23 LAB — PROTIME-INR
INR: 1.15
PROTHROMBIN TIME: 14.7 s (ref 11.4–15.2)

## 2016-12-23 MED ORDER — LABETALOL HCL 200 MG PO TABS: 200.0000 mg | ORAL_TABLET | Freq: Three times a day (TID) | ORAL | 1 refills | Status: DC

## 2016-12-23 MED ORDER — RIVAROXABAN 15 MG PO TABS: 15.0000 mg | ORAL_TABLET | Freq: Two times a day (BID) | ORAL | 0 refills | Status: DC

## 2016-12-23 MED ORDER — PANTOPRAZOLE SODIUM 40 MG PO TBEC: 40.0000 mg | DELAYED_RELEASE_TABLET | Freq: Every day | ORAL | 1 refills | Status: DC

## 2016-12-23 MED ORDER — GUAIFENESIN 100 MG/5ML PO SOLN: 5.0000 mL | Freq: Four times a day (QID) | ORAL | 0 refills | Status: DC | PRN

## 2016-12-23 MED ORDER — LEVOFLOXACIN 750 MG PO TABS: 750.0000 mg | ORAL_TABLET | Freq: Every day | ORAL | 0 refills | Status: DC

## 2016-12-23 MED ORDER — RIVAROXABAN (XARELTO) EDUCATION KIT FOR DVT/PE PATIENTS
PACK | Freq: Once | Status: AC
  Administered 2016-12-23: 1
  Filled 2016-12-23: qty 1

## 2016-12-23 MED ORDER — RIVAROXABAN 15 MG PO TABS: 15.0000 mg | ORAL_TABLET | Freq: Two times a day (BID) | ORAL | Status: DC

## 2016-12-23 MED ORDER — RIVAROXABAN 20 MG PO TABS: 20.0000 mg | ORAL_TABLET | Freq: Every day | ORAL | 1 refills | Status: DC

## 2016-12-23 MED ORDER — ENOXAPARIN SODIUM 120 MG/0.8ML ~~LOC~~ SOLN
120.0000 mg | SUBCUTANEOUS | Status: DC
  Filled 2016-12-23: qty 0.8

## 2016-12-23 MED ORDER — RIVAROXABAN 20 MG PO TABS: 20.0000 mg | ORAL_TABLET | Freq: Every day | ORAL | Status: DC

## 2016-12-23 MED ORDER — DEXAMETHASONE 4 MG PO TABS: ORAL_TABLET | ORAL | 3 refills | Status: DC

## 2016-12-23 MED ORDER — RIVAROXABAN 15 MG PO TABS
15.0000 mg | ORAL_TABLET | Freq: Once | ORAL | Status: AC
  Administered 2016-12-23: 15 mg via ORAL
  Filled 2016-12-23: qty 1

## 2016-12-23 MED ORDER — RIVAROXABAN (XARELTO) EDUCATION KIT FOR DVT/PE PATIENTS: 1.0000 | PACK | Freq: Once | 0 refills | Status: AC

## 2016-12-23 MED ORDER — HEPARIN SOD (PORK) LOCK FLUSH 100 UNIT/ML IV SOLN
500.0000 [IU] | INTRAVENOUS | Status: AC | PRN
  Administered 2016-12-23: 500 [IU]

## 2016-12-23 NOTE — Progress Notes (Signed)
Department of Radiation Oncology  Phone:  [phone] Fax:        [phone]  Weekly Treatment Note    Name: [name] Date: 12/23/2016 MRN: [id number] DOB: [birth year]   Diagnosis:     ICD-9-CM ICD-10-CM   1. 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    No current outpatient prescriptions on file.   Facility-Administered Medications Ordered in Other Encounters  Medication Dose Route Frequency Provider Last Rate Last Dose  . 0.9 %  sodium chloride infusion  250 mL Intravenous PRN Vianne Bulls, MD      . diphenhydrAMINE (BENADRYL) capsule 25 mg  25 mg Oral Q6H PRN Gardiner Barefoot, NP   25 mg at 12/23/16 [id number]  . enoxaparin (LOVENOX) injection 120 mg  120 mg Subcutaneous Q24H Barton Dubois, MD      . guaiFENesin (ROBITUSSIN) 100 MG/5ML solution 100 mg  5 mL Oral Q4H PRN Vianne Bulls, MD   100 mg at 12/22/16 2309  . hydrALAZINE (APRESOLINE) injection 10 mg  10 mg Intravenous Q8H PRN Dron Tanna Furry, MD   10 mg at 12/17/16 0406  . HYDROcodone-acetaminophen (NORCO/VICODIN) 5-325 MG per tablet 1-2 tablet  1-2 tablet Oral Q4H PRN Vianne Bulls, MD   2 tablet at 12/23/16 0251  . iopamidol (ISOVUE-300) 61 % injection 75 mL  75 mL Intravenous Once PRN Ilene Qua Opyd, MD      . labetalol (NORMODYNE) tablet 200 mg  200 mg Oral TID Dron Tanna Furry, MD   200 mg at 12/23/16 [id number]  . levalbuterol (XOPENEX) nebulizer solution 0.63 mg  0.63 mg Nebulization Q6H PRN Dron Tanna Furry, MD   0.63 mg at 12/18/16 2036  . levofloxacin (LEVAQUIN) tablet 750 mg  750 mg Oral Daily Angela Adam, RPH   750 mg at 12/23/16 [id number]  . MEDLINE mouth rinse  15 mL Mouth Rinse BID Vianne Bulls, MD   15 mL at 12/23/16 [id number]  . methylPREDNISolone sodium succinate (SOLU-MEDROL) 125 mg/2 mL injection 60 mg   60 mg Intravenous Q6H Chesley Mires, MD   60 mg at 12/23/16 0545  . morphine 2 MG/ML injection 2 mg  2 mg Intravenous Q4H PRN Vianne Bulls, MD      . pantoprazole (PROTONIX) EC tablet 40 mg  40 mg Oral Daily Chesley Mires, MD   40 mg at 12/23/16 0937  . sodium chloride 0.9 % bolus 1,000 mL  1,000 mL Intravenous Once Jeryl Columbia, NP      . sodium chloride flush (NS) 0.9 % injection 10-40 mL  10-40 mL Intracatheter PRN Barton Dubois, MD      . sodium chloride flush (NS) 0.9 % injection 3 mL  3 mL Intravenous Q12H Vianne Bulls, MD   3 mL at 12/22/16 0941  . sodium chloride flush (NS) 0.9 % injection 3 mL  3 mL Intravenous PRN Vianne Bulls, MD         ALLERGIES: Patient has no known allergies.   LABORATORY DATA:  Lab Results  Component Value Date   WBC 12.5 (H) 12/22/2016   HGB 10.5 (L) 12/22/2016   HCT 33.1 (L) 12/22/2016   MCV 96.2 12/22/2016   PLT 508 (H) 12/22/2016   Lab Results  Component Value Date   NA 139 12/21/2016  K 4.1 12/21/2016   CL 96 (L) 12/21/2016   CO2 36 (H) 12/21/2016   Lab Results  Component Value Date   ALT 12 (L) 12/15/2016   AST 15 12/15/2016   ALKPHOS 101 12/15/2016   BILITOT 0.6 12/15/2016     NARRATIVE: Heidi [name] was seen today for weekly treatment management. The chart was checked and the patient's films were reviewed.  The patient denies pain. She reports fatigue and weakness. She reports SOB when walking.  PHYSICAL EXAMINATION: oral temperature is 98.6 F (37 C). Her blood pressure is 132/62 and her pulse is 77. Her respiration is 20 and oxygen saturation is 97%.   On 6L oxygen via nasal cannula. In a wheelchair. On IV fluids.  ASSESSMENT: The patient has done satisfactorily with treatment.  PLAN: She will return in 1 month for a follow up.  ------------------------------------------------  Jodelle Gross, MD, PhD  This document serves as a record of services personally performed by Kyung Rudd, MD. It was created on his  behalf by Darcus Austin, a trained medical scribe. The creation of this record is based on the scribe's personal observations and the provider's statements to them. This document has been checked and approved by the attending provider.

## 2016-12-23 NOTE — Discharge Summary (Signed)
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 Discharge home with instructions to be compliant with PCP and oncology service as an outpatient.  Diet recommendation: heart healthy diet   Filed Weights   12/14/16 1900 12/14/16 2255  Weight: 65.8 kg (145 lb) 74.5 kg (164 lb 3.9 oz)    History of present illness:  As per H&P written by Dr. Myna Hidalgo on 12/14/16 30 y.o. female with medical history significant for Hodgkin lymphoma, presenting to the emergency department with 1 week of progressive dyspnea and right sided edema. Patient is under the care of oncology and has undergone chemotherapy, but unfortunately has shown up for some infusions. She has marked adenopathy and biopsy proven lung involvement.  She had admitted to the hospital approximately 3 weeks ago for acute hypoxic respiratory failure suspected secondary to CAP. She left AGAINST MEDICAL ADVICE on 12/04/2016 and, while she initially did okay back at home, she has developed recurrence in severe dyspnea over the past week. She denies any chest pain or palpitations, but notes dyspnea at rest and with minimal exertion. She reports a cough productive of thick clear and white sputum, but denies any fevers or chills. Over  the past one week, she has also noted swelling involving the right face and right arm. This has continued to progress and she denies any prior experience with similar symptoms.  Hospital Course:  #Acute hypoxic respiratory failure most likely due to progression of lymphoma vs pneumonia -CT chest showed right lung complete opacity and increase in the site of left lung nodules represent neoplastic disease versus pneumonia. -Initially treated with IV vancomycin and cefepime for presumed healthcare associated pneumonia.  -On Levaquin; planing to treat for another 3 days to complete abx therapy.. -Continue to follow up culture results. No growth so far. -Resume decadron at discharge. -Oncology and radiation oncology consult service evaluated the patient. She is very noncompliant with the outpatient follow-up. Started radiation therapy since 12/21. Encourage to be compliant with treatments at discharge. -I spoke with the radiation therapy team, discussed with Shona [facility], plan to continue radiation therapy for now. Also discussed with the medicine oncologist Dr. Burr Medico regarding further plan. As per prior oncology note, there was discussion about possible starting chemotherapy inpatient; but patient not willing to stay hospitalized for that; will like to go home and return to the clinic for infusions. -No improvement in patient's hypoxia. She has dyspnea on exertion (stable/slightly improved).  -Continue bronchodilators and breathing treatment.  -Requiring 3-4 L of oxygen.  # right IJ vein DVT: Reviewed the results of CT chest which was consistent with nonopacification of right upper brachiocephalic vein and right IJ concerning for occlusion. -per patient preference (not looking to inject herself); discharge on Xarelto for treatment of her new DVT. -The chest x-ray showed  coiled portacatheter.  -IR consulted and right port a cath discontinue -left port a cath placed on 12/27 afternoon  -LE duplex  confirm right IJ DVT  #Hodgkins lymphoma: Follow-up medicine oncology and radiation oncologist evaluation and recommendation. As discussed above.  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 Treatment including radiation therapy as discussed above. -planning initiation of chemotherapy in upcoming week  -stable at discharge  # Hypertensive urgency and Sinus tachycardia:  -improved/well controlled -will continue labetalol oral 3 times a day.  -advise to follow heart healthy diet.   Procedures:  See below for x-ray reports   Discontinuation of right port a cath and placement of left port a cath    upper extremities vasc duplex: positive right IJ DVT  Consultations:  Oncology   IR  Radiation oncology   Pulmonary service   Discharge Exam: Vitals:   12/22/16 2046 12/23/16 0538  BP: 124/90 124/87  Pulse: 95 100  Resp: 20 20  Temp: 98.9 F (37.2 C) 98.9 F (37.2 C)   General exam: Not in distress, feeling good today and with good O2 sat on [place] supplementation. denies CP and tolerated radiation therapy w/o major complications.  Eyes: PERRLA, no icterus Respiratory system: No breathing sounds heard on the right side, and left-sided clear. No accessory muscle use. Cardiovascular system: Regular rate rhythm, S1 and S2 normal. No murmur. No pedal edema. Gastrointestinal system: Abdomen soft, nontender. Bowel sound positive. Central nervous system: Alert and oriented. No focal neurological deficits. Extremities: Symmetric 5 x 5 power. RUE swelling and pain in upper aspect of her arm. Skin: No rashes, lesions or ulcers Psychiatry: Judgement and insight appear normal. Mood & affect appropriate.   Discharge Instructions   Discharge Instructions    Diet - low sodium heart healthy    Complete by:  As directed    Discharge instructions    Complete by:  As  directed    Keep yourself well hydrated Take medications as prescribed  Use oxygen as instructed Be compliant with your chemotherapy and radiation therapy  Minimize the use of NSAIDs over the counter, as you are on blood thinners therapy now     Current Discharge Medication List    START taking these medications   Details  guaiFENesin (ROBITUSSIN) 100 MG/5ML SOLN Take 5 mLs (100 mg total) by mouth every 6 (six) hours as needed for cough or to loosen phlegm. Qty: 1200 mL, Refills: 0    labetalol (NORMODYNE) 200 MG tablet Take 1 tablet (200 mg total) by mouth 3 (three) times daily. Qty: 90 tablet, Refills: 1    levofloxacin (LEVAQUIN) 750 MG tablet Take 1 tablet (750 mg total) by mouth daily. Qty: 3 tablet, Refills: 0    pantoprazole (PROTONIX) 40 MG tablet Take 1 tablet (40 mg total) by mouth daily. Qty: 30 tablet, Refills: 1    !! Rivaroxaban (XARELTO) 15 MG TABS tablet Take 1 tablet (15 mg total) by mouth 2 (two) times daily with a meal. Qty: 42 tablet, Refills: 0    !! rivaroxaban (XARELTO) 20 MG TABS tablet Take 1 tablet (20 mg total) by mouth daily with supper. Qty: 30 tablet, Refills: 1    rivaroxaban (XARELTO) KIT 1 kit by Does not apply route once. Qty: 1 kit, Refills: 0     !! - Potential duplicate medications found. Please discuss with provider.    CONTINUE these medications which have CHANGED  Details  dexamethasone (DECADRON) 4 MG tablet 2 tab (10m) twice a day for 3 days starting on day of chemotherapy Qty: 30 tablet, Refills: 3      CONTINUE these medications which have NOT CHANGED   Details  levalbuterol (XOPENEX HFA) 45 MCG/ACT inhaler Inhale 2 puffs into the lungs every 6 (six) hours as needed for wheezing. Qty: 1 Inhaler, Refills: 12    lidocaine-prilocaine (EMLA) cream Apply 1 application topically as needed (numbing).     loratadine (CLARITIN) 10 MG tablet Take 1 tablet (10 mg total) by mouth daily. For up to 5 days following Neulasta  injection. Qty: 5 tablet, Refills: 2    ondansetron (ZOFRAN) 8 MG tablet Take 1 tablet (8 mg total) by mouth every 8 (eight) hours as needed for nausea (start on the 3rd day after chemotherapy). Qty: 30 tablet, Refills: 3    prochlorperazine (COMPAZINE) 10 MG tablet Take 1 tablet (10 mg total) by mouth every 6 (six) hours as needed for nausea or vomiting. Qty: 30 tablet, Refills: 3      STOP taking these medications     metoprolol (LOPRESSOR) 50 MG tablet        No Known Allergies Follow-up Information    DMaren Reamer MD. Schedule an appointment as soon as possible for a visit in 2 week(s).   Specialty:  Internal Medicine Contact information: [id number] MD Follow up on 12/30/2016.   Specialties:  Hematology, Oncology Contact information: [place] [phone]          The results of significant diagnostics from this hospitalization (including imaging, microbiology, ancillary and laboratory) are listed below for reference.    Significant Diagnostic Studies: Dg Chest 2 View  Result Date: 12/18/2016 CLINICAL DATA:  Worsening shortness of breath x3 weeks, history of Hodgkin's lymphoma, cough, weakness EXAM: CHEST  2 VIEW COMPARISON:  CT chest dated 12/15/2016 FINDINGS: Near complete opacification of the right lung, possibly reflecting a combination of lymphoma and pneumonia, better evaluated on CT. Mild patchy left upper lobe opacity, suspicious for pneumonia when correlating with CT. Malpositioned right chest port is looped in the neck and likely terminates in the right internal jugular vein. IMPRESSION: Near complete opacification of the right lung, possibly reflecting combination of lymphoma and pneumonia, better evaluated on CT. Mild patchy left upper lobe opacity, suspicious for pneumonia. Malpositioned right chest port is looped in the neck and likely terminates in the right  internal jugular vein, unchanged. Removal is suggested. Electronically Signed   By: SJulian HyM.D.   On: 12/18/2016 12:06   Dg Chest 2 View  Result Date: 12/14/2016 CLINICAL DATA:  Shortness of breath for 1 week. History of Hodgkin's lymphoma. EXAM: CHEST  2 VIEW COMPARISON:  11/26/2016 and 11/23/2016 FINDINGS: There is now complete opacification of the right hemithorax. Based on the previous CT, this is probably related to consolidation. Pleural fluid cannot be excluded. Again noted are patchy densities in the mid left lung which could be infectious but worrisome for a neoplastic process. Heart size is grossly stable. Trachea is mildly deviated towards the left. Patient is a right jugular Port-A-Cath with the tip in the right innominate vein region. The catheter tip has pulled back since the original placement images and there is a small loop within the right internal jugular vein. IMPRESSION: Complete opacification of the right hemithorax. Based on the previous  chest CT, this probably represents diffuse consolidation. Pleural fluid cannot be excluded. Persistent nodular density in the mid left lung. This has minimally changed since 11/26/2016. Although this could be infectious, neoplastic process cannot be excluded in the left lung. The right jugular Port-A-Cath tip remains in the right innominate vein. However, a portion of the tube is coiled in the right internal jugular vein which is not ideal and consider removal and/or repositioning. Electronically Signed   By: Markus Daft M.D.   On: 12/14/2016 19:39   Ct Chest W Contrast  Result Date: 12/15/2016 CLINICAL DATA:  30 year old female with lung mass and right chest and arm and breast swelling. 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 EXAM: CT CHEST WITH CONTRAST TECHNIQUE: Multidetector CT imaging of the chest was performed during intravenous contrast administration. CONTRAST:  75 cc Isovue-300 COMPARISON:  Chest radiograph dated 12/14/2016 and CT dated  11/23/2016 FINDINGS: Cardiovascular: There is no cardiomegaly or pericardial effusion. There is mild shift of the heart and mediastinum into the left hemithorax secondary to mass effect caused by right lung mass/consolidation. The thoracic aorta appears unremarkable. The origins of the great vessels of the aortic arch appear patent. There is dilatation of the main pulmonary trunk indicative of underlying pulmonary hypertension. Evaluation for pulmonary embolism is very limited due to suboptimal opacification of the vasculature and respiratory motion artifact. No filling defect identified within the pulmonary trunk or central portion of the main pulmonary arteries. Right pectoral Port-A-Cath again noted which extends up into the right IJ. The catheter then turns down with tip in the right brachiocephalic vein in similar appearance as the prior CT. There is apparent occlusion of the right brachiocephalic vein as well as a degree of occlusion or stricture of the right IJ. There is non opacification of the right subclavian vein. Mediastinum/Nodes: Anterior mediastinal mass/ lymphadenopathy measures 2.9 x 3.6 cm (previously approximately 2.1 x 3.9 cm). Right paratracheal adenopathy measures up to 16 mm in short axis (previously 13 mm). Subcarinal adenopathy measures 17 mm in short axis grossly similar prior study. The esophagus is not well visualized. Lungs/Pleura: There has been interval progression of right lung consolidation now involving the entire right lung. There is compression and occlusion of the distal portion of the right mainstem bronchus. There is overall increased right lung volume with mass effect and mild displacement of the mediastinum into the left hemithorax. Multiple nodular densities in the left lung predominantly involving the left upper lobe have increased in size since the prior CT. The largest nodule or confluence of nodules measures approximately 17 x 15 mm (previously approximately 10 x 8 mm).  These may be infectious in etiology or represent metastatic disease. There is a small right pleural effusion. No pleural effusion noted on the left. There is no pneumothorax. Upper Abdomen: There is retroperitoneal adenopathy in the upper abdomen. Partially visualized perihepatic ascites. Musculoskeletal: Diffuse subcutaneous edema and anasarca. No drainable fluid collection. IMPRESSION: Interval progression of right lung mass with complete opacification of the right lung. Interval increase in the size of the left lung nodules which may represent progression of pneumonia or neoplastic disease. Clinical correlation is recommended. Non opacification of the right brachiocephalic vein and right IJ concerning for occlusion of these vessels. Progression of mediastinal adenopathy. Upper abdominal retroperitoneal adenopathy. Small right pleural effusion, partially visualized ascites, diffuse subcutaneous edema and anasarca. Electronically Signed   By: Anner Crete M.D.   On: 12/15/2016 06:39   Ir Removal [employer] Access W/ [employer] W/o Fl Mod Sed  Result Date: 12/22/2016 CLINICAL DATA:  Hodgkin's lymphoma. Malpositioned right IJ port catheter with venous occlusion. Recent placement of new left IJ port. EXAM: EXAM TUNNELED PORT CATHETER REMOVAL TECHNIQUE: The procedure, risks (including but not limited to bleeding, infection, organ damage ), benefits, and alternatives were explained to the patient. Questions regarding the procedure were encouraged and answered. The patient understands and consents to the procedure. Intravenous Fentanyl and Versed were administered as conscious sedation during continuous monitoring of the patient's level of consciousness and physiological / cardiorespiratory status by the radiology RN, with a total moderate sedation time of 40 minutes. Overlying skin prepped with chlorhexidine, draped in usual sterile fashion, infiltrated locally with 1% lidocaine. A small incision was made over the scar from  previous placement. The port catheter was dissected free from the underlying soft tissues and removed intact. Hemostasis was achieved. The port pocket was closed with deep interrupted and subcuticular continuous 3-0 Monocryl sutures, then covered with Dermabond. The patient tolerated the procedure well. COMPLICATIONS: COMPLICATIONS None immediate IMPRESSION: 1.  Technically successful tunneled Port catheter removal. Electronically Signed   By: Lucrezia Europe M.D.   On: 12/22/2016 17:06   Ir Cv Line Injection  Result Date: 12/22/2016 CLINICAL DATA:  Hodgkin's lymphoma. Right lung mass and mediastinal adenopathy. Right IJ port catheter that I placed 05/17/2016, found on radiography to be malpositioned into the right IJ vein. CT suggests right IJ and brachiocephalic vein occlusion. EXAM: PORT  CATHETER INJECTION UNDER FLUOROSCOPY TECHNIQUE: The procedure, risks (including but not limited to bleeding, infection, organ damage ), benefits, and alternatives were explained to the patient. Questions regarding the procedure were encouraged and answered. The patient understands and consents to the procedure. Survey fluoroscopic inspection reveals buckling of the intravascular portion of the port catheter into the right IJ vein, the tip retracted up to the level of the confluence of the IJ and subclavian veins. Injection demonstrates patency of the port reservoir and catheter. There is occlusion of the right IJ vein. There is occlusion of the right brachiocephalic vein. The injected contrast drains via small thyrocervical collaterals. No extravasation. IMPRESSION: 1. Malpositioned right IJ port catheter with IJ and right brachiocephalic vein occlusion. Revision is scheduled. Electronically Signed   By: Lucrezia Europe M.D.   On: 12/22/2016 17:05   Ir US Guide Vasc Access Left  Result Date: 12/22/2016 CLINICAL DATA:  Hodgkin's lymphoma. Needs durable long-term venous access for treatment regimen. Malpositioned right IJ port  with occluded right IJ and brachiocephalic veins. EXAM: TUNNELED PORT CATHETER PLACEMENT WITH ULTRASOUND AND FLUOROSCOPIC GUIDANCE FLUOROSCOPY TIME:  30 seconds, 5 mGy ANESTHESIA/SEDATION: Intravenous Fentanyl and Versed were administered as conscious sedation during continuous monitoring of the patient's level of consciousness and physiological / cardiorespiratory status by the radiology RN, with a total moderate sedation time of 40 minutes. TECHNIQUE: The procedure, risks, benefits, and alternatives were explained to the patient. Questions regarding the procedure were encouraged and answered. The patient understands and consents to the procedure. As antibiotic prophylaxis, vancomycin 1 g was ordered pre-procedure and administered intravenously within one hour of incision. Patency of the LEFT IJ vein was confirmed with ultrasound with image documentation. An appropriate skin site was determined. Skin site was marked. Region was prepped using maximum barrier technique including cap and mask, sterile gown, sterile gloves, large sterile sheet, and Chlorhexidine as cutaneous antisepsis. The region was infiltrated locally with 1% lidocaine. Under real-time ultrasound guidance, the LEFT IJ vein was accessed with a 21 gauge micropuncture needle; the needle tip  within the vein was confirmed with ultrasound image documentation. Needle was exchanged over a 018 guidewire for transitional dilator which allowed passage of the [facility] wire into the IVC. Over this, the transitional dilator was exchanged for a 5 [place] MPA catheter. A small incision was made on the LEFT anterior chest wall and a subcutaneous pocket fashioned. The power-injectable port was positioned and its catheter tunneled to the LEFT IJ dermatotomy site. The MPA catheter was exchanged over an Amplatz wire for a peel-away sheath, through which the port catheter, which had been trimmed to the appropriate length, was advanced and positioned under fluoroscopy with  its tip at the cavoatrial junction. Spot chest radiograph confirms good catheter position and no pneumothorax. The pocket was closed with deep interrupted and subcuticular continuous 3-0 Monocryl sutures. The port was flushed per protocol. The incisions were covered with Dermabond then covered with a sterile dressing. COMPLICATIONS: COMPLICATIONS None immediate IMPRESSION: Technically successful LEFT IJ power-injectable port catheter placement. Ready for routine use. Electronically Signed   By: Lucrezia Europe M.D.   On: 12/22/2016 17:03   Dg Chest Port 1 View  Result Date: 11/26/2016 CLINICAL DATA:  Status post bronchoscopy and right lower lobe biopsy. History of Hodgkin's lymphoma EXAM: PORTABLE CHEST 1 VIEW COMPARISON:  November 23, 2016 chest CT and chest radiograph FINDINGS: No pneumothorax. Consolidation throughout most of the right lung remains. There is patchy infiltrate in the left upper lobe. No new opacity evident compared to recent studies. Heart size is within normal limits. Pulmonary vascularity is obscured on the right and grossly normal on the left. Port-A-Cath tip is in the superior vena cava, unchanged. Right paratracheal adenopathy is stable. IMPRESSION: No pneumothorax. Widespread consolidation on the right and patchy infiltrate left upper lobe remain stable. Stable cardiac silhouette. There is right paratracheal adenopathy. Stable Port-A-Cath position. Electronically Signed   By: Lowella Grip III M.D.   On: 11/26/2016 08:26   Ir Fluoro Guide Port Insertion Left  Result Date: 12/22/2016 CLINICAL DATA:  Hodgkin's lymphoma. Needs durable long-term venous access for treatment regimen. Malpositioned right IJ port with occluded right IJ and brachiocephalic veins. EXAM: TUNNELED PORT CATHETER PLACEMENT WITH ULTRASOUND AND FLUOROSCOPIC GUIDANCE FLUOROSCOPY TIME:  30 seconds, 5 mGy ANESTHESIA/SEDATION: Intravenous Fentanyl and Versed were administered as conscious sedation during continuous  monitoring of the patient's level of consciousness and physiological / cardiorespiratory status by the radiology RN, with a total moderate sedation time of 40 minutes. TECHNIQUE: The procedure, risks, benefits, and alternatives were explained to the patient. Questions regarding the procedure were encouraged and answered. The patient understands and consents to the procedure. As antibiotic prophylaxis, vancomycin 1 g was ordered pre-procedure and administered intravenously within one hour of incision. Patency of the LEFT IJ vein was confirmed with ultrasound with image documentation. An appropriate skin site was determined. Skin site was marked. Region was prepped using maximum barrier technique including cap and mask, sterile gown, sterile gloves, large sterile sheet, and Chlorhexidine as cutaneous antisepsis. The region was infiltrated locally with 1% lidocaine. Under real-time ultrasound guidance, the LEFT IJ vein was accessed with a 21 gauge micropuncture needle; the needle tip within the vein was confirmed with ultrasound image documentation. Needle was exchanged over a 018 guidewire for transitional dilator which allowed passage of the [facility] wire into the IVC. Over this, the transitional dilator was exchanged for a 5 [place] MPA catheter. A small incision was made on the LEFT anterior chest wall and a subcutaneous pocket fashioned. The power-injectable port was  positioned and its catheter tunneled to the LEFT IJ dermatotomy site. The MPA catheter was exchanged over an Amplatz wire for a peel-away sheath, through which the port catheter, which had been trimmed to the appropriate length, was advanced and positioned under fluoroscopy with its tip at the cavoatrial junction. Spot chest radiograph confirms good catheter position and no pneumothorax. The pocket was closed with deep interrupted and subcuticular continuous 3-0 Monocryl sutures. The port was flushed per protocol. The incisions were covered with  Dermabond then covered with a sterile dressing. COMPLICATIONS: COMPLICATIONS None immediate IMPRESSION: Technically successful LEFT IJ power-injectable port catheter placement. Ready for routine use. Electronically Signed   By: Lucrezia Europe M.D.   On: 12/22/2016 17:03    Microbiology: Recent Results (from the past 240 hour(s))  Culture, blood (Routine X 2) w Reflex to ID Panel     Status: None   Collection Time: 12/14/16  7:45 PM  Result Value Ref Range Status   Specimen Description BLOOD LEFT WRIST  Final   Special Requests BOTTLES DRAWN AEROBIC AND ANAEROBIC 5CC  Final   Culture   Final    NO GROWTH 5 DAYS Performed at [facility]    Report Status 12/19/2016 FINAL  Final  MRSA PCR Screening     Status: None   Collection Time: 12/14/16 10:52 PM  Result Value Ref Range Status   MRSA by PCR NEGATIVE NEGATIVE Final    Comment:        The GeneXpert MRSA Assay (FDA approved for NASAL specimens only), is one component of a comprehensive MRSA colonization surveillance program. It is not intended to diagnose MRSA infection nor to guide or monitor treatment for MRSA infections.   Culture, sputum-assessment     Status: None   Collection Time: 12/15/16 12:50 AM  Result Value Ref Range Status   Specimen Description SPU  Final   Special Requests Immunocompromised  Final   Sputum evaluation THIS SPECIMEN IS ACCEPTABLE FOR SPUTUM CULTURE  Final   Report Status 12/15/2016 FINAL  Final  Culture, respiratory (NON-Expectorated)     Status: None   Collection Time: 12/15/16 12:50 AM  Result Value Ref Range Status   Specimen Description SPU  Final   Special Requests Immunocompromised Reflexed from [id number]  Final   Gram Stain   Final    MODERATE WBC PRESENT, PREDOMINANTLY PMN FEW SQUAMOUS EPITHELIAL CELLS PRESENT FEW GRAM POSITIVE COCCI IN PAIRS FEW GRAM POSITIVE COCCOBACILLUS    Culture   Final    Consistent with normal respiratory flora. Performed at [facility]    Report  Status 12/17/2016 FINAL  Final  Culture, blood (Routine X 2) w Reflex to ID Panel     Status: None   Collection Time: 12/15/16  2:25 AM  Result Value Ref Range Status   Specimen Description BLOOD RIGHT WRIST  Final   Special Requests BOTTLES DRAWN AEROBIC ONLY 5CC  Final   Culture   Final    NO GROWTH 5 DAYS Performed at [facility]    Report Status 12/20/2016 FINAL  Final     Labs: Basic Metabolic Panel:  Recent Labs Lab 12/18/16 0423 12/21/16 0512 12/22/16 0509  NA 138 139  --   K 4.4 4.1  --   CL 97* 96*  --   CO2 35* 36*  --   GLUCOSE 127* 143*  --   BUN 9 11  --   CREATININE 0.43* 0.50  --   CALCIUM 8.3* 8.2*  --  MG  --   --  2.1  PHOS  --   --  3.6   CBC:  Recent Labs Lab 12/18/16 0423 12/21/16 0512 12/22/16 0509  WBC 18.1* 14.3* 12.5*  NEUTROABS  --   --  12.2*  HGB 10.3* 10.6* 10.5*  HCT 32.7* 33.5* 33.1*  MCV 93.7 95.4 96.2  PLT 676* 582* 508*   BNP (last 3 results)  Recent Labs  12/14/16 1946  BNP 28.0   CBG:  Recent Labs Lab 12/18/16 2318  GLUCAP 172*     Signed:  Barton Dubois MD.  [facility] 12/23/2016, 2:11 PM

## 2016-12-23 NOTE — Progress Notes (Signed)
Heidi [name]   DOB:[birth year]   [id number]    Subjective: She feels well. Completed XRT. She declined inpatient chemotherapy. The patient denies any recent signs or symptoms of bleeding such as spontaneous epistaxis, hematuria or hematochezia. She is breathing fine with supplemental oxygen. No changes to her LN  Objective:  Vitals:   12/22/16 2046 12/23/16 0538  BP: 124/90 124/87  Pulse: 95 100  Resp: 20 20  Temp: 98.9 F (37.2 C) 98.9 F (37.2 C)     Intake/Output Summary (Last 24 hours) at 12/23/16 1229 Last data filed at 12/23/16 1000  Gross per 24 hour  Intake              360 ml  Output              200 ml  Net              160 ml    GENERAL:alert, no distress and comfortable. Oxygen via nasal cannual SKIN: skin color, texture, turgor are normal, no rashes or significant lesions EYES: normal, Conjunctiva are pink and non-injected, sclera clear OROPHARYNX:no exudate, no erythema and lips, buccal mucosa, and tongue normal  NECK: supple, thyroid normal size, non-tender, without nodularity LYMPH:  Persistent lymphadenopathy LUNGS: clear to auscultation and percussion with normal breathing effort HEART: regular rate & rhythm and no murmurs and no lower extremity edema ABDOMEN:abdomen soft, non-tender and normal bowel sounds Musculoskeletal:no cyanosis of digits and no clubbing  NEURO: alert & oriented x 3 with fluent speech, no focal motor/sensory deficits   Labs:  Lab Results  Component Value Date   WBC 12.5 (H) 12/22/2016   HGB 10.5 (L) 12/22/2016   HCT 33.1 (L) 12/22/2016   MCV 96.2 12/22/2016   PLT 508 (H) 12/22/2016   NEUTROABS 12.2 (H) 12/22/2016    Lab Results  Component Value Date   NA 139 12/21/2016   K 4.1 12/21/2016   CL 96 (L) 12/21/2016   CO2 36 (H) 12/21/2016    Studies:  Ir Removal [employer] W/ [place] W/o Fl Mod Sed  Result Date: 12/22/2016 CLINICAL DATA:  Hodgkin's lymphoma. Malpositioned right IJ port catheter with venous occlusion.  Recent placement of new left IJ port. EXAM: EXAM TUNNELED PORT CATHETER REMOVAL TECHNIQUE: The procedure, risks (including but not limited to bleeding, infection, organ damage ), benefits, and alternatives were explained to the patient. Questions regarding the procedure were encouraged and answered. The patient understands and consents to the procedure. Intravenous Fentanyl and Versed were administered as conscious sedation during continuous monitoring of the patient's level of consciousness and physiological / cardiorespiratory status by the radiology RN, with a total moderate sedation time of 40 minutes. Overlying skin prepped with chlorhexidine, draped in usual sterile fashion, infiltrated locally with 1% lidocaine. A small incision was made over the scar from previous placement. The port catheter was dissected free from the underlying soft tissues and removed intact. Hemostasis was achieved. The port pocket was closed with deep interrupted and subcuticular continuous 3-0 Monocryl sutures, then covered with Dermabond. The patient tolerated the procedure well. COMPLICATIONS: COMPLICATIONS None immediate IMPRESSION: 1.  Technically successful tunneled Port catheter removal. Electronically Signed   By: Lucrezia Europe M.D.   On: 12/22/2016 17:06   Ir Cv Line Injection  Result Date: 12/22/2016 CLINICAL DATA:  Hodgkin's lymphoma. Right lung mass and mediastinal adenopathy. Right IJ port catheter that I placed 05/17/2016, found on radiography to be malpositioned into the right IJ vein. CT suggests right IJ and  brachiocephalic vein occlusion. EXAM: PORT  CATHETER INJECTION UNDER FLUOROSCOPY TECHNIQUE: The procedure, risks (including but not limited to bleeding, infection, organ damage ), benefits, and alternatives were explained to the patient. Questions regarding the procedure were encouraged and answered. The patient understands and consents to the procedure. Survey fluoroscopic inspection reveals buckling of the  intravascular portion of the port catheter into the right IJ vein, the tip retracted up to the level of the confluence of the IJ and subclavian veins. Injection demonstrates patency of the port reservoir and catheter. There is occlusion of the right IJ vein. There is occlusion of the right brachiocephalic vein. The injected contrast drains via small thyrocervical collaterals. No extravasation. IMPRESSION: 1. Malpositioned right IJ port catheter with IJ and right brachiocephalic vein occlusion. Revision is scheduled. Electronically Signed   By: Lucrezia Europe M.D.   On: 12/22/2016 17:05   Ir US Guide Vasc Access Left  Result Date: 12/22/2016 CLINICAL DATA:  Hodgkin's lymphoma. Needs durable long-term venous access for treatment regimen. Malpositioned right IJ port with occluded right IJ and brachiocephalic veins. EXAM: TUNNELED PORT CATHETER PLACEMENT WITH ULTRASOUND AND FLUOROSCOPIC GUIDANCE FLUOROSCOPY TIME:  30 seconds, 5 mGy ANESTHESIA/SEDATION: Intravenous Fentanyl and Versed were administered as conscious sedation during continuous monitoring of the patient's level of consciousness and physiological / cardiorespiratory status by the radiology RN, with a total moderate sedation time of 40 minutes. TECHNIQUE: The procedure, risks, benefits, and alternatives were explained to the patient. Questions regarding the procedure were encouraged and answered. The patient understands and consents to the procedure. As antibiotic prophylaxis, vancomycin 1 g was ordered pre-procedure and administered intravenously within one hour of incision. Patency of the LEFT IJ vein was confirmed with ultrasound with image documentation. An appropriate skin site was determined. Skin site was marked. Region was prepped using maximum barrier technique including cap and mask, sterile gown, sterile gloves, large sterile sheet, and Chlorhexidine as cutaneous antisepsis. The region was infiltrated locally with 1% lidocaine. Under real-time  ultrasound guidance, the LEFT IJ vein was accessed with a 21 gauge micropuncture needle; the needle tip within the vein was confirmed with ultrasound image documentation. Needle was exchanged over a 018 guidewire for transitional dilator which allowed passage of the [facility] wire into the IVC. Over this, the transitional dilator was exchanged for a 5 [place] MPA catheter. A small incision was made on the LEFT anterior chest wall and a subcutaneous pocket fashioned. The power-injectable port was positioned and its catheter tunneled to the LEFT IJ dermatotomy site. The MPA catheter was exchanged over an Amplatz wire for a peel-away sheath, through which the port catheter, which had been trimmed to the appropriate length, was advanced and positioned under fluoroscopy with its tip at the cavoatrial junction. Spot chest radiograph confirms good catheter position and no pneumothorax. The pocket was closed with deep interrupted and subcuticular continuous 3-0 Monocryl sutures. The port was flushed per protocol. The incisions were covered with Dermabond then covered with a sterile dressing. COMPLICATIONS: COMPLICATIONS None immediate IMPRESSION: Technically successful LEFT IJ power-injectable port catheter placement. Ready for routine use. Electronically Signed   By: Lucrezia Europe M.D.   On: 12/22/2016 17:03   Ir Fluoro Guide Port Insertion Left  Result Date: 12/22/2016 CLINICAL DATA:  Hodgkin's lymphoma. Needs durable long-term venous access for treatment regimen. Malpositioned right IJ port with occluded right IJ and brachiocephalic veins. EXAM: TUNNELED PORT CATHETER PLACEMENT WITH ULTRASOUND AND FLUOROSCOPIC GUIDANCE FLUOROSCOPY TIME:  30 seconds, 5 mGy ANESTHESIA/SEDATION: Intravenous Fentanyl and Versed  were administered as conscious sedation during continuous monitoring of the patient's level of consciousness and physiological / cardiorespiratory status by the radiology RN, with a total moderate sedation time of 40  minutes. TECHNIQUE: The procedure, risks, benefits, and alternatives were explained to the patient. Questions regarding the procedure were encouraged and answered. The patient understands and consents to the procedure. As antibiotic prophylaxis, vancomycin 1 g was ordered pre-procedure and administered intravenously within one hour of incision. Patency of the LEFT IJ vein was confirmed with ultrasound with image documentation. An appropriate skin site was determined. Skin site was marked. Region was prepped using maximum barrier technique including cap and mask, sterile gown, sterile gloves, large sterile sheet, and Chlorhexidine as cutaneous antisepsis. The region was infiltrated locally with 1% lidocaine. Under real-time ultrasound guidance, the LEFT IJ vein was accessed with a 21 gauge micropuncture needle; the needle tip within the vein was confirmed with ultrasound image documentation. Needle was exchanged over a 018 guidewire for transitional dilator which allowed passage of the [facility] wire into the IVC. Over this, the transitional dilator was exchanged for a 5 [place] MPA catheter. A small incision was made on the LEFT anterior chest wall and a subcutaneous pocket fashioned. The power-injectable port was positioned and its catheter tunneled to the LEFT IJ dermatotomy site. The MPA catheter was exchanged over an Amplatz wire for a peel-away sheath, through which the port catheter, which had been trimmed to the appropriate length, was advanced and positioned under fluoroscopy with its tip at the cavoatrial junction. Spot chest radiograph confirms good catheter position and no pneumothorax. The pocket was closed with deep interrupted and subcuticular continuous 3-0 Monocryl sutures. The port was flushed per protocol. The incisions were covered with Dermabond then covered with a sterile dressing. COMPLICATIONS: COMPLICATIONS None immediate IMPRESSION: Technically successful LEFT IJ power-injectable port catheter  placement. Ready for routine use. Electronically Signed   By: Lucrezia Europe M.D.   On: 12/22/2016 17:03    Assessment & Plan:   1. 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 Right side obstructive pneumonia 4. Hypoxic respiratory failure, secondary to #2 and #3 5. HTN  6.  right IJ DVT, port removed  7. Anemia, probably related to her HL  8. Thrombocytosis, likely reactive to HL   She declined inpatient chemo. She has oxygen at home Anticoagulation therapy is switched to xarelto Chemo is scheduled next week. Will arrange follow-up with Dr. Irene Limbo Will sign off  Heath Lark, MD 12/23/2016  12:29 PM

## 2016-12-23 NOTE — Progress Notes (Addendum)
ANTICOAGULATION CONSULT NOTE -   Pharmacy Consult for Lovenox >> Xarelto Indication: VTE treatment  No Known Allergies  Patient Measurements: Height: 5\' 7"  (170.2 cm) Weight: 164 lb 3.9 oz (74.5 kg) IBW/kg (Calculated) : 61.6  Vital Signs: Temp: 98.9 F (37.2 C) (12/28 0538) Temp Source: Oral (12/28 0538) BP: 124/87 (12/28 0538) Pulse Rate: 100 (12/28 0538)  Labs:  Recent Labs  12/21/16 0512 12/22/16 0509 12/23/16 0329  HGB 10.6* 10.5*  --   HCT 33.5* 33.1*  --   PLT 582* 508*  --   LABPROT 14.3 14.5 14.7  INR 1.11 1.12 1.15  CREATININE 0.50  --   --     Estimated Creatinine Clearance: 107.4 mL/min (by C-G formula based on SCr of 0.5 mg/dL).   Assessment: 49 yoF admitted with respiratory failure 2/2 progressive Hodgkins lymphoma with lung involvement.  Pt also found to have possible right brachiocephalic vein DVT felt associated with right PAC.  MD and Oncologist recommend full dose lovenox for now with possible consideration of oral anticoagulation at follow-up visit outpatient.  Pharmacy consulted to assist with dosing.   On initial review of CT scan, IR physician did not see convincing evidence of brachiocephalic DVT, so f/u Doppler ordered which showed clot in the right IJ. Port was also removed and relocated on 12/27 d/t poor positioning/looping in the vessel. Lovenox was held starting the prior evening for this procedure, and remains on hold  12/23/2016  CrCl >100 ml/min  No new CBC today: Hgb low but stable yesterday, plts elevated (likely d/t lymphoma)  Lovenox on hold; last dose 12/26 AM  Seen by oncology yesterday who agrees with continuing anticoagulation to restart this AM but deferring choice of Xarelto or LMWH to Internal Med and patient   Goal of Therapy:  Anti-Xa level 0.6-1 units/ml 4hrs after LMWH dose given Monitor platelets by anticoagulation protocol: Yes   Plan:   After discussion with patient, will start Xarelto for patient preference  and improved compliance  Xarelto 15 mg PO bid with meals x 21 days, then 20 mg PO daily with supper afterwards  CBC at least q72 hr, SCr weekly  Monitor for signs/symptoms of bleeding  Reuel Boom, PharmD, BCPS Pager: [phone] 12/23/2016, 10:14 AM

## 2016-12-23 NOTE — Progress Notes (Signed)
PAIN: She is currently in no pain.  SKIN: Skin presenting warm dry and intact.  OTHER: Pt complains of fatigue and weakness. Pt reports SOB when walking is on 6 L Oxygen via nasal cannula, 0.9 % NS infusing via Port at 22ml/hr. EOT BP 132/62   Pulse 77   Temp 98.6 F (37 C) (Oral)   Resp 20   LMP 10/13/2016 (Approximate) Comment: patient was shielded  SpO2 97%  Wt Readings from Last 3 Encounters:  12/14/16 164 lb 3.9 oz (74.5 kg)  11/27/16 145 lb 3.2 oz (65.9 kg)  10/19/16 144 lb (65.3 kg)

## 2016-12-23 NOTE — Care Management Note (Signed)
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 of Stay Meetings, dates discussed:    Additional Comments: Cm met with pt in room and gave pt Free 30 day trial card for Xarelto.  Pt verbalized understanding this card will pay for today's prescription and give insurance time to authorize for refills.  CM notified [place] DME rep, Larene Beach to please deliver a loaner tank of O2 to room prior to discharge.  No other CM needs were communicated. Dellie Catholic, RN 12/23/2016, 11:55 AM

## 2016-12-23 NOTE — Discharge Instructions (Signed)
Information on my medicine - XARELTO (rivaroxaban)  This medication education was reviewed with me or my healthcare representative as part of my discharge preparation.  The pharmacist that spoke with me during my hospital stay was:  Emmalynn Pinkham A, [place]? Xarelto was prescribed to treat blood clots that may have been found in the veins of your legs (deep vein thrombosis) or in your lungs (pulmonary embolism) and to reduce the risk of them occurring again.  What do you need to know about Xarelto? The starting dose is one 15 mg tablet taken TWICE daily with food for the FIRST 21 DAYS then on (enter date)  01/13/2017  the dose is changed to one 20 mg tablet taken ONCE A DAY with your evening meal.  DO NOT stop taking Xarelto without talking to the health care provider who prescribed the medication.  Refill your prescription for 20 mg tablets before you run out.  After discharge, you should have regular check-up appointments with your healthcare provider that is prescribing your Xarelto.  In the future your dose may need to be changed if your kidney function changes by a significant amount.  What do you do if you miss a dose? If you are taking Xarelto TWICE DAILY and you miss a dose, take it as soon as you remember. You may take two 15 mg tablets (total 30 mg) at the same time then resume your regularly scheduled 15 mg twice daily the next day.  If you are taking Xarelto ONCE DAILY and you miss a dose, take it as soon as you remember on the same day then continue your regularly scheduled once daily regimen the next day. Do not take two doses of Xarelto at the same time.   Important Safety Information Xarelto is a blood thinner medicine that can cause bleeding. You should call your healthcare provider right away if you experience any of the following: ? Bleeding from an injury or your nose that does not stop. ? Unusual colored urine (red or dark brown) or  unusual colored stools (red or black). ? Unusual bruising for unknown reasons. ? A serious fall or if you hit your head (even if there is no bleeding).  Some medicines may interact with Xarelto and might increase your risk of bleeding while on Xarelto. To help avoid this, consult your healthcare provider or pharmacist prior to using any new prescription or non-prescription medications, including herbals, vitamins, non-steroidal anti-inflammatory drugs (NSAIDs) and supplements.  This website has more information on Xarelto: [email or web address]

## 2016-12-23 NOTE — Progress Notes (Signed)
Heidi [name]   DOB:[birth year]   [id number]   ID:[id number]  ONCOLOGY FOLLOW UP NOTE   Subjective: Pt had her right side port removed by IR this afternoon, and had new port placed in left side. She just came back from the procedure when I saw her on the floor. She was quite drowsy.  Objective:  Vitals:   12/22/16 1727 12/22/16 2046  BP: (!) 148/98 124/90  Pulse: 91 95  Resp: (!) 22 20  Temp: 97.8 F (36.6 C) 98.9 F (37.2 C)    Body mass index is 25.72 kg/m.  Intake/Output Summary (Last 24 hours) at 12/23/16 0120 Last data filed at 12/22/16 1918  Gross per 24 hour  Intake                0 ml  Output              200 ml  Net             -200 ml     Sclerae unicteric  Oropharynx clear  No peripheral adenopathy  Lungs: significantly decreased breath sound on right  Heart regular rate and rhythm  Abdomen benign  MSK no focal spinal tenderness, no peripheral edema  Neuro nonfocal  (+) significant edema on right side, from head to foot   CBG (last 3)  No results for input(s): GLUCAP in the last 72 hours.   Labs:  Lab Results  Component Value Date   WBC 12.5 (H) 12/22/2016   HGB 10.5 (L) 12/22/2016   HCT 33.1 (L) 12/22/2016   MCV 96.2 12/22/2016   PLT 508 (H) 12/22/2016   NEUTROABS 12.2 (H) 12/22/2016   CMP Latest Ref Rng & Units 12/21/2016 12/18/2016 12/15/2016  Glucose 65 - 99 mg/dL 143(H) 127(H) 118(H)  BUN 6 - 20 mg/dL 11 9 7   Creatinine 0.44 - 1.00 mg/dL 0.50 0.43(L) 0.42(L)  Sodium 135 - 145 mmol/L 139 138 135  Potassium 3.5 - 5.1 mmol/L 4.1 4.4 4.1  Chloride 101 - 111 mmol/L 96(L) 97(L) 95(L)  CO2 22 - 32 mmol/L 36(H) 35(H) 33(H)  Calcium 8.9 - 10.3 mg/dL 8.2(L) 8.3(L) 7.9(L)  Total Protein 6.5 - 8.1 g/dL - - 5.0(L)  Total Bilirubin 0.3 - 1.2 mg/dL - - 0.6  Alkaline Phos 38 - 126 U/L - - 101  AST 15 - 41 U/L - - 15  ALT 14 - 54 U/L - - 12(L)    Urine Studies No results for input(s): UHGB, CRYS in the last 72 hours.  Invalid input(s):  UACOL, UAPR, USPG, UPH, UTP, UGL, UKET, UBIL, UNIT, UROB, ULEU, UEPI, UWBC, URBC, UBAC, CAST, UCOM, [place]  Basic Metabolic Panel:  Recent Labs Lab 12/18/16 0423 12/21/16 0512 12/22/16 0509  NA 138 139  --   K 4.4 4.1  --   CL 97* 96*  --   CO2 35* 36*  --   GLUCOSE 127* 143*  --   BUN 9 11  --   CREATININE 0.43* 0.50  --   CALCIUM 8.3* 8.2*  --   MG  --   --  2.1  PHOS  --   --  3.6   GFR Estimated Creatinine Clearance: 107.4 mL/min (by C-G formula based on SCr of 0.5 mg/dL). Liver Function Tests: No results for input(s): AST, ALT, ALKPHOS, BILITOT, PROT, ALBUMIN in the last 168 hours. No results for input(s): LIPASE, AMYLASE in the last 168 hours. No results for input(s): AMMONIA in the last 168 hours. Coagulation  profile  Recent Labs Lab 12/18/16 0423 12/19/16 0542 12/20/16 0812 12/21/16 0512 12/22/16 0509  INR 1.19 1.12 1.10 1.11 1.12    CBC:  Recent Labs Lab 12/18/16 0423 12/21/16 0512 12/22/16 0509  WBC 18.1* 14.3* 12.5*  NEUTROABS  --   --  12.2*  HGB 10.3* 10.6* 10.5*  HCT 32.7* 33.5* 33.1*  MCV 93.7 95.4 96.2  PLT 676* 582* 508*   Cardiac Enzymes: No results for input(s): CKTOTAL, CKMB, CKMBINDEX, TROPONINI in the last 168 hours. BNP: Invalid input(s): POCBNP CBG:  Recent Labs Lab 12/18/16 2318  GLUCAP 172*   D-Dimer No results for input(s): DDIMER in the last 72 hours. Hgb A1c No results for input(s): HGBA1C in the last 72 hours. Lipid Profile No results for input(s): CHOL, HDL, LDLCALC, TRIG, CHOLHDL, LDLDIRECT in the last 72 hours. Thyroid function studies No results for input(s): TSH, T4TOTAL, T3FREE, THYROIDAB in the last 72 hours.  Invalid input(s): FREET3 Anemia work up No results for input(s): VITAMINB12, FOLATE, FERRITIN, TIBC, IRON, RETICCTPCT in the last 72 hours. Microbiology Recent Results (from the past 240 hour(s))  Culture, blood (Routine X 2) w Reflex to ID Panel     Status: None   Collection Time: 12/14/16  7:45  PM  Result Value Ref Range Status   Specimen Description BLOOD LEFT WRIST  Final   Special Requests BOTTLES DRAWN AEROBIC AND ANAEROBIC 5CC  Final   Culture   Final    NO GROWTH 5 DAYS Performed at [facility]    Report Status 12/19/2016 FINAL  Final  MRSA PCR Screening     Status: None   Collection Time: 12/14/16 10:52 PM  Result Value Ref Range Status   MRSA by PCR NEGATIVE NEGATIVE Final    Comment:        The GeneXpert MRSA Assay (FDA approved for NASAL specimens only), is one component of a comprehensive MRSA colonization surveillance program. It is not intended to diagnose MRSA infection nor to guide or monitor treatment for MRSA infections.   Culture, sputum-assessment     Status: None   Collection Time: 12/15/16 12:50 AM  Result Value Ref Range Status   Specimen Description SPU  Final   Special Requests Immunocompromised  Final   Sputum evaluation THIS SPECIMEN IS ACCEPTABLE FOR SPUTUM CULTURE  Final   Report Status 12/15/2016 FINAL  Final  Culture, respiratory (NON-Expectorated)     Status: None   Collection Time: 12/15/16 12:50 AM  Result Value Ref Range Status   Specimen Description SPU  Final   Special Requests Immunocompromised Reflexed from [id number]  Final   Gram Stain   Final    MODERATE WBC PRESENT, PREDOMINANTLY PMN FEW SQUAMOUS EPITHELIAL CELLS PRESENT FEW GRAM POSITIVE COCCI IN PAIRS FEW GRAM POSITIVE COCCOBACILLUS    Culture   Final    Consistent with normal respiratory flora. Performed at [facility]    Report Status 12/17/2016 FINAL  Final  Culture, blood (Routine X 2) w Reflex to ID Panel     Status: None   Collection Time: 12/15/16  2:25 AM  Result Value Ref Range Status   Specimen Description BLOOD RIGHT WRIST  Final   Special Requests BOTTLES DRAWN AEROBIC ONLY 5CC  Final   Culture   Final    NO GROWTH 5 DAYS Performed at [facility]    Report Status 12/20/2016 FINAL  Final      Studies:  Ir Removal [employer] W/ [place] W/o Fl Mod  Sed  Result Date: 12/22/2016 CLINICAL DATA:  Hodgkin's lymphoma. Malpositioned right IJ port catheter with venous occlusion. Recent placement of new left IJ port. EXAM: EXAM TUNNELED PORT CATHETER REMOVAL TECHNIQUE: The procedure, risks (including but not limited to bleeding, infection, organ damage ), benefits, and alternatives were explained to the patient. Questions regarding the procedure were encouraged and answered. The patient understands and consents to the procedure. Intravenous Fentanyl and Versed were administered as conscious sedation during continuous monitoring of the patient's level of consciousness and physiological / cardiorespiratory status by the radiology RN, with a total moderate sedation time of 40 minutes. Overlying skin prepped with chlorhexidine, draped in usual sterile fashion, infiltrated locally with 1% lidocaine. A small incision was made over the scar from previous placement. The port catheter was dissected free from the underlying soft tissues and removed intact. Hemostasis was achieved. The port pocket was closed with deep interrupted and subcuticular continuous 3-0 Monocryl sutures, then covered with Dermabond. The patient tolerated the procedure well. COMPLICATIONS: COMPLICATIONS None immediate IMPRESSION: 1.  Technically successful tunneled Port catheter removal. Electronically Signed   By: Lucrezia Europe M.D.   On: 12/22/2016 17:06   Ir Cv Line Injection  Result Date: 12/22/2016 CLINICAL DATA:  Hodgkin's lymphoma. Right lung mass and mediastinal adenopathy. Right IJ port catheter that I placed 05/17/2016, found on radiography to be malpositioned into the right IJ vein. CT suggests right IJ and brachiocephalic vein occlusion. EXAM: PORT  CATHETER INJECTION UNDER FLUOROSCOPY TECHNIQUE: The procedure, risks (including but not limited to bleeding, infection, organ damage ), benefits, and alternatives were explained to the patient. Questions regarding the  procedure were encouraged and answered. The patient understands and consents to the procedure. Survey fluoroscopic inspection reveals buckling of the intravascular portion of the port catheter into the right IJ vein, the tip retracted up to the level of the confluence of the IJ and subclavian veins. Injection demonstrates patency of the port reservoir and catheter. There is occlusion of the right IJ vein. There is occlusion of the right brachiocephalic vein. The injected contrast drains via small thyrocervical collaterals. No extravasation. IMPRESSION: 1. Malpositioned right IJ port catheter with IJ and right brachiocephalic vein occlusion. Revision is scheduled. Electronically Signed   By: Lucrezia Europe M.D.   On: 12/22/2016 17:05   Ir US Guide Vasc Access Left  Result Date: 12/22/2016 CLINICAL DATA:  Hodgkin's lymphoma. Needs durable long-term venous access for treatment regimen. Malpositioned right IJ port with occluded right IJ and brachiocephalic veins. EXAM: TUNNELED PORT CATHETER PLACEMENT WITH ULTRASOUND AND FLUOROSCOPIC GUIDANCE FLUOROSCOPY TIME:  30 seconds, 5 mGy ANESTHESIA/SEDATION: Intravenous Fentanyl and Versed were administered as conscious sedation during continuous monitoring of the patient's level of consciousness and physiological / cardiorespiratory status by the radiology RN, with a total moderate sedation time of 40 minutes. TECHNIQUE: The procedure, risks, benefits, and alternatives were explained to the patient. Questions regarding the procedure were encouraged and answered. The patient understands and consents to the procedure. As antibiotic prophylaxis, vancomycin 1 g was ordered pre-procedure and administered intravenously within one hour of incision. Patency of the LEFT IJ vein was confirmed with ultrasound with image documentation. An appropriate skin site was determined. Skin site was marked. Region was prepped using maximum barrier technique including cap and mask, sterile gown,  sterile gloves, large sterile sheet, and Chlorhexidine as cutaneous antisepsis. The region was infiltrated locally with 1% lidocaine. Under real-time ultrasound guidance, the LEFT IJ vein was accessed with a 21 gauge micropuncture needle; the  needle tip within the vein was confirmed with ultrasound image documentation. Needle was exchanged over a 018 guidewire for transitional dilator which allowed passage of the [facility] wire into the IVC. Over this, the transitional dilator was exchanged for a 5 [place] MPA catheter. A small incision was made on the LEFT anterior chest wall and a subcutaneous pocket fashioned. The power-injectable port was positioned and its catheter tunneled to the LEFT IJ dermatotomy site. The MPA catheter was exchanged over an Amplatz wire for a peel-away sheath, through which the port catheter, which had been trimmed to the appropriate length, was advanced and positioned under fluoroscopy with its tip at the cavoatrial junction. Spot chest radiograph confirms good catheter position and no pneumothorax. The pocket was closed with deep interrupted and subcuticular continuous 3-0 Monocryl sutures. The port was flushed per protocol. The incisions were covered with Dermabond then covered with a sterile dressing. COMPLICATIONS: COMPLICATIONS None immediate IMPRESSION: Technically successful LEFT IJ power-injectable port catheter placement. Ready for routine use. Electronically Signed   By: Lucrezia Europe M.D.   On: 12/22/2016 17:03   Ir Fluoro Guide Port Insertion Left  Result Date: 12/22/2016 CLINICAL DATA:  Hodgkin's lymphoma. Needs durable long-term venous access for treatment regimen. Malpositioned right IJ port with occluded right IJ and brachiocephalic veins. EXAM: TUNNELED PORT CATHETER PLACEMENT WITH ULTRASOUND AND FLUOROSCOPIC GUIDANCE FLUOROSCOPY TIME:  30 seconds, 5 mGy ANESTHESIA/SEDATION: Intravenous Fentanyl and Versed were administered as conscious sedation during continuous monitoring  of the patient's level of consciousness and physiological / cardiorespiratory status by the radiology RN, with a total moderate sedation time of 40 minutes. TECHNIQUE: The procedure, risks, benefits, and alternatives were explained to the patient. Questions regarding the procedure were encouraged and answered. The patient understands and consents to the procedure. As antibiotic prophylaxis, vancomycin 1 g was ordered pre-procedure and administered intravenously within one hour of incision. Patency of the LEFT IJ vein was confirmed with ultrasound with image documentation. An appropriate skin site was determined. Skin site was marked. Region was prepped using maximum barrier technique including cap and mask, sterile gown, sterile gloves, large sterile sheet, and Chlorhexidine as cutaneous antisepsis. The region was infiltrated locally with 1% lidocaine. Under real-time ultrasound guidance, the LEFT IJ vein was accessed with a 21 gauge micropuncture needle; the needle tip within the vein was confirmed with ultrasound image documentation. Needle was exchanged over a 018 guidewire for transitional dilator which allowed passage of the [facility] wire into the IVC. Over this, the transitional dilator was exchanged for a 5 [place] MPA catheter. A small incision was made on the LEFT anterior chest wall and a subcutaneous pocket fashioned. The power-injectable port was positioned and its catheter tunneled to the LEFT IJ dermatotomy site. The MPA catheter was exchanged over an Amplatz wire for a peel-away sheath, through which the port catheter, which had been trimmed to the appropriate length, was advanced and positioned under fluoroscopy with its tip at the cavoatrial junction. Spot chest radiograph confirms good catheter position and no pneumothorax. The pocket was closed with deep interrupted and subcuticular continuous 3-0 Monocryl sutures. The port was flushed per protocol. The incisions were covered with Dermabond then  covered with a sterile dressing. COMPLICATIONS: COMPLICATIONS None immediate IMPRESSION: Technically successful LEFT IJ power-injectable port catheter placement. Ready for routine use. Electronically Signed   By: Lucrezia Europe M.D.   On: 12/22/2016 17:03    Assessment: 30 y.o. African-American female, with refractory classical Hodgkin lymphoma, right lung collapse secondary to lymphoma, SVC  syndrome  1. 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 Right side obstructive pneumonia 4. Hypoxic respiratory failure, secondary to #2 and #3 5. HTN  6.  right IJ DVT, port removed  7. Anemia, probably related to her HL  8. Thrombocytosis, likely reactive to HL   Plan:  -she is scheduled to complete radiation tomorrow at 11:15am. -I strongly encouraged her to stay in the hospital to start chemotherapy on Friday and complete on Saturday 12/30, but she declined. She strongly prefers to be discharged after radiation tomorrow. She is concerned about her children at home. -I have tried to schedule her return to our clinic to start chemo on 1/2, but no infusion slots are available, she is scheduled to start chemo on 1/4 as outpt for now, appointments in epic  -if she has other medical issues and requires longer hospital stay, then I would recommend her to start chemotherapy on Friday 12/29 -I will be out of office for the rest of this week, My colleague Dr. Alvy Bimler will be available to see her if needed.  -I recommend resume lovenox or switch to Xarelto 15mg  bid,  to be started tomorrow morning for her right IJ DVT.    Truitt Merle, MD 12/23/2016  1:20 AM

## 2016-12-27 LAB — FUNGUS CULTURE RESULT

## 2016-12-27 LAB — FUNGAL ORGANISM REFLEX

## 2016-12-27 LAB — FUNGUS CULTURE WITH STAIN

## 2016-12-28 ENCOUNTER — Telehealth: Payer: Self-pay | Admitting: Hematology

## 2016-12-28 NOTE — Telephone Encounter (Signed)
sw pt to try and sch lab/ov for 1/3 per LOS. Pt stated she will need to find a ride for tomorrow and will call back to set up appt time

## 2016-12-29 ENCOUNTER — Telehealth: Payer: Self-pay | Admitting: Hematology

## 2016-12-29 ENCOUNTER — Telehealth: Payer: Self-pay | Admitting: *Deleted

## 2016-12-29 NOTE — Telephone Encounter (Signed)
Per 1/3 los called pt to sch lab/ov appt for today.   Made pt aware that she needed the MD visit prior to getting chemo and without it she will not be able to receive treatment on tomorrow.   Pt stated she needed to call her cousin to see about a ride and will call the office to schedule appt today.   Pt stated she has the number to call the office back when asked

## 2016-12-29 NOTE — Progress Notes (Signed)
  Radiation Oncology         [phone]) [phone] ________________________________  Name: Heidi [name] MRN: [id number]  Date: 12/23/2016  DOB: 1986/07/30   End of Treatment Note  Diagnosis: 31 y.o. woman with Hodgkins disease involving the mediastinum and left lung  Indication for treatment:  Palliative  Radiation treatment dates:   12/16/16 - 12/23/16  Site/dose: The chest was treated with 8 Gy in 2 fractions and then a boost of 9 Gy in 3 fractions to a total of 17 Gy in 5 fractions.  Beams/energy:   A five field 3D conformal treatment arrangement was used delivering 10 MV photons.  Daily image-guidance CT was used to align the treatment with the targeted volume  Narrative: The patient tolerated radiation treatment relatively well.  The patient denied pain, but reported fatigue, weakness, and SOB when walking.  The patient also noted fatigue.  Plan: The patient has completed radiation treatment. The patient will return to radiation oncology clinic for routine followup in one month. I advised her to call or return sooner if she has any questions or concerns related to her recovery or treatment.  ________________________________  Sheral Apley. Tammi Klippel, M.D.  This document serves as a record of services personally performed by Tyler Pita, MD. It was created on his behalf by Darcus Austin, a trained medical scribe. The creation of this record is based on the scribe's personal observations and the provider's statements to them. This document has been checked and approved by the attending provider.

## 2016-12-29 NOTE — Telephone Encounter (Signed)
SW patient regarding treatment.  Pt unable to make MD visit today.   Per Dr. Irene Limbo, it is necessary to see pt prior to starting treatment, he will try to see patient in infusion area prior to treatment tomorrow (1/4).  Pt stated she had already cancelled transportation but will call back to reschedule transportation.  Pt stated she will be here for chemo apt tomorrow.

## 2016-12-30 ENCOUNTER — Encounter: Payer: Self-pay | Admitting: *Deleted

## 2016-12-30 ENCOUNTER — Telehealth: Payer: Self-pay | Admitting: *Deleted

## 2016-12-30 ENCOUNTER — Ambulatory Visit (HOSPITAL_BASED_OUTPATIENT_CLINIC_OR_DEPARTMENT_OTHER): Payer: Medicaid Other | Admitting: Hematology

## 2016-12-30 ENCOUNTER — Ambulatory Visit (HOSPITAL_BASED_OUTPATIENT_CLINIC_OR_DEPARTMENT_OTHER): Payer: Medicaid Other

## 2016-12-30 ENCOUNTER — Other Ambulatory Visit (HOSPITAL_BASED_OUTPATIENT_CLINIC_OR_DEPARTMENT_OTHER): Payer: Medicaid Other

## 2016-12-30 VITALS — BP 137/87 | HR 143 | Temp 99.8°F | Resp 20

## 2016-12-30 DIAGNOSIS — J9691 Respiratory failure, unspecified with hypoxia: Secondary | ICD-10-CM | POA: Diagnosis not present

## 2016-12-30 DIAGNOSIS — C8128 Mixed cellularity classical Hodgkin lymphoma, lymph nodes of multiple sites: Secondary | ICD-10-CM

## 2016-12-30 DIAGNOSIS — I871 Compression of vein: Secondary | ICD-10-CM

## 2016-12-30 DIAGNOSIS — Z9114 Patient's other noncompliance with medication regimen: Secondary | ICD-10-CM

## 2016-12-30 DIAGNOSIS — Z5112 Encounter for antineoplastic immunotherapy: Secondary | ICD-10-CM

## 2016-12-30 DIAGNOSIS — Z5111 Encounter for antineoplastic chemotherapy: Secondary | ICD-10-CM

## 2016-12-30 LAB — COMPREHENSIVE METABOLIC PANEL
ALT: 13 U/L (ref 0–55)
ANION GAP: 8 meq/L (ref 3–11)
AST: 10 U/L (ref 5–34)
Albumin: 2.1 g/dL — ABNORMAL LOW (ref 3.5–5.0)
Alkaline Phosphatase: 118 U/L (ref 40–150)
BILIRUBIN TOTAL: 0.51 mg/dL (ref 0.20–1.20)
BUN: 4.6 mg/dL — ABNORMAL LOW (ref 7.0–26.0)
CALCIUM: 8.4 mg/dL (ref 8.4–10.4)
CO2: 33 meq/L — AB (ref 22–29)
CREATININE: 0.5 mg/dL — AB (ref 0.6–1.1)
Chloride: 94 mEq/L — ABNORMAL LOW (ref 98–109)
EGFR: >90 mL/min/{1.73_m2} (ref >90)
Glucose: 101 mg/dl (ref 70–140)
Potassium: 4.2 mEq/L (ref 3.5–5.1)
Sodium: 135 mEq/L — ABNORMAL LOW (ref 136–145)
Total Protein: 4.5 g/dL — ABNORMAL LOW (ref 6.4–8.3)

## 2016-12-30 LAB — CBC WITH DIFFERENTIAL/PLATELET
BASO%: 0.1 % (ref 0.0–2.0)
BASOS ABS: 0 10*3/uL (ref 0.0–0.1)
EOS%: 0.5 % (ref 0.0–7.0)
Eosinophils Absolute: 0.1 10*3/uL (ref 0.0–0.5)
HCT: 33.1 % — ABNORMAL LOW (ref 34.8–46.6)
HEMOGLOBIN: 10.7 g/dL — AB (ref 11.6–15.9)
LYMPH%: 2.3 % — AB (ref 14.0–49.7)
MCH: 29.8 pg (ref 25.1–34.0)
MCHC: 32.3 g/dL (ref 31.5–36.0)
MCV: 92.2 fL (ref 79.5–101.0)
MONO#: 1.2 10*3/uL — ABNORMAL HIGH (ref 0.1–0.9)
MONO%: 9.4 % (ref 0.0–14.0)
NEUT#: 11.2 10*3/uL — ABNORMAL HIGH (ref 1.5–6.5)
NEUT%: 87.7 % — ABNORMAL HIGH (ref 38.4–76.8)
Platelets: 231 10*3/uL (ref 145–400)
RBC: 3.59 10*6/uL — AB (ref 3.70–5.45)
RDW: 16.4 % — AB (ref 11.2–14.5)
WBC: 12.8 10*3/uL — ABNORMAL HIGH (ref 3.9–10.3)
lymph#: 0.3 10*3/uL — ABNORMAL LOW (ref 0.9–3.3)

## 2016-12-30 MED ORDER — SODIUM CHLORIDE 0.9 % IV SOLN
90.0000 mg/m2 | Freq: Once | INTRAVENOUS | Status: AC
  Administered 2016-12-30: 175 mg via INTRAVENOUS
  Filled 2016-12-30: qty 7

## 2016-12-30 MED ORDER — SODIUM CHLORIDE 0.9 % IV SOLN
Freq: Once | INTRAVENOUS | Status: AC
  Administered 2016-12-30: 10:00:00 via INTRAVENOUS

## 2016-12-30 MED ORDER — DIPHENHYDRAMINE HCL 50 MG/ML IJ SOLN
50.0000 mg | Freq: Once | INTRAMUSCULAR | Status: AC
  Administered 2016-12-30: 50 mg via INTRAVENOUS

## 2016-12-30 MED ORDER — PALONOSETRON HCL INJECTION 0.25 MG/5ML
INTRAVENOUS | Status: AC
Start: 2016-12-30 — End: 2016-12-30
  Filled 2016-12-30: qty 5

## 2016-12-30 MED ORDER — DIPHENHYDRAMINE HCL 50 MG/ML IJ SOLN
INTRAMUSCULAR | Status: AC
  Filled 2016-12-30: qty 1

## 2016-12-30 MED ORDER — SODIUM CHLORIDE 0.9% FLUSH
10.0000 mL | INTRAVENOUS | Status: DC | PRN
  Administered 2016-12-30: 10 mL
  Filled 2016-12-30: qty 10

## 2016-12-30 MED ORDER — HEPARIN SOD (PORK) LOCK FLUSH 100 UNIT/ML IV SOLN
500.0000 [IU] | Freq: Once | INTRAVENOUS | Status: AC | PRN
  Administered 2016-12-30: 500 [IU]
  Filled 2016-12-30: qty 5

## 2016-12-30 MED ORDER — DEXAMETHASONE SODIUM PHOSPHATE 10 MG/ML IJ SOLN
10.0000 mg | Freq: Once | INTRAMUSCULAR | Status: AC
  Administered 2016-12-30: 10 mg via INTRAVENOUS

## 2016-12-30 MED ORDER — DEXAMETHASONE SODIUM PHOSPHATE 10 MG/ML IJ SOLN
INTRAMUSCULAR | Status: AC
  Filled 2016-12-30: qty 1

## 2016-12-30 MED ORDER — SODIUM CHLORIDE 0.9 % IV SOLN
1.8000 mg/kg | Freq: Once | INTRAVENOUS | Status: AC
  Administered 2016-12-30: 135 mg via INTRAVENOUS
  Filled 2016-12-30: qty 27

## 2016-12-30 MED ORDER — ACETAMINOPHEN 325 MG PO TABS
650.0000 mg | ORAL_TABLET | Freq: Once | ORAL | Status: AC
  Administered 2016-12-30: 650 mg via ORAL

## 2016-12-30 MED ORDER — PALONOSETRON HCL INJECTION 0.25 MG/5ML
0.2500 mg | Freq: Once | INTRAVENOUS | Status: AC
  Administered 2016-12-30: 0.25 mg via INTRAVENOUS

## 2016-12-30 MED ORDER — ACETAMINOPHEN 325 MG PO TABS
ORAL_TABLET | ORAL | Status: AC
  Filled 2016-12-30: qty 2

## 2016-12-30 NOTE — Progress Notes (Signed)
Dr. Irene Limbo aware of patients VS, per Delle Reining RN per Dr. Irene Limbo okay to proceed with treatment at this time. Pt tolerated infusion well. Discharge Education performed, pt verbalizes understanding.  Pt stable at discharge.

## 2016-12-30 NOTE — Patient Instructions (Signed)
[facility] Discharge Instructions for Patients Receiving Chemotherapy  Today you received the following chemotherapy agents: Adcetris and Bendeka   To help prevent nausea and vomiting after your treatment, we encourage you to take your nausea medication as directed.  May start Compazine 10 MG by mouth every 6 hours as needed for Nausea and Vomiting1/4/18. May start Zofran 8 MG by mouth every 8 hours as needed for Nausea and Vomiting 01/01/17.   If you develop nausea and vomiting that is not controlled by your nausea medication, call the clinic.   BELOW ARE SYMPTOMS THAT SHOULD BE REPORTED IMMEDIATELY:  *FEVER GREATER THAN 100.5 F  *CHILLS WITH OR WITHOUT FEVER  NAUSEA AND VOMITING THAT IS NOT CONTROLLED WITH YOUR NAUSEA MEDICATION  *UNUSUAL SHORTNESS OF BREATH  *UNUSUAL BRUISING OR BLEEDING  TENDERNESS IN MOUTH AND THROAT WITH OR WITHOUT PRESENCE OF ULCERS  *URINARY PROBLEMS  *BOWEL PROBLEMS  UNUSUAL RASH Items with * indicate a potential emergency and should be followed up as soon as possible.  Feel free to call the clinic you have any questions or concerns. The clinic phone number is [phone].  Please show the [facility] at check-in to the Emergency Department and triage nurse.   Brentuximab vedotin solution for injection What is this medicine? BRENTUXIMAB VEDOTIN (bren TUX see mab ve DOE tin) is a monoclonal antibody and a chemotherapy drug. It is used for treating Hodgkin lymphoma and certain non-Hodgkin lymphomas, such as systemic anaplastic large cell lymphoma. COMMON BRAND NAME(S): ADCETRIS What should I tell my health care provider before I take this medicine? They need to know if you have any of these conditions: -immune system problems -infection (especially a virus infection such as chickenpox, cold sores, or herpes) -kidney disease -liver disease -low blood counts, like low white cell, platelet, or red cell counts -tingling of the  fingers or toes, or other nerve disorder -an unusual or allergic reaction to brentuximab vedotin, other medicines, foods, dyes, or preservatives -pregnant or trying to get pregnant -breast-feeding How should I use this medicine? This medicine is for infusion into a vein. It is given by a health care professional in a hospital or clinic setting. Talk to your pediatrician regarding the use of this medicine in children. Special care may be needed. What if I miss a dose? It is important not to miss your dose. Call your doctor or health care professional if you are unable to keep an appointment. What may interact with this medicine? This medicine may interact with the following medications: -ketoconazole -rifampin -St. John's wort; Hypericum perforatum What should I watch for while using this medicine? Visit your doctor for checks on your progress. This drug may make you feel generally unwell. Report any side effects. Continue your course of treatment even though you feel ill unless your doctor tells you to stop. Call your doctor or health care professional for advice if you get a fever, chills or sore throat, or other symptoms of a cold or flu. Do not treat yourself. This drug decreases your body's ability to fight infections. Try to avoid being around people who are sick. This medicine may increase your risk to bruise or bleed. Call your doctor or health care professional if you notice any unusual bleeding. In some patients, this medicine may cause a serious brain infection that may cause death. If you have any problems seeing, thinking, speaking, walking, or standing, tell your doctor right away. If you cannot reach your doctor, urgently seek  other source of medical care. Do not become pregnant while taking this medicine or for 6 months after stopping it. Women should inform their doctor if they wish to become pregnant or think they might be pregnant. Men should not father a child while taking this  medicine and for 6 months after stopping it. There is a potential for serious side effects to an unborn child. Talk to your health care professional or pharmacist for more information. Do not breast-feed an infant while taking this medicine. This may interfere with the ability to father a child. You should talk to your doctor or health care professional if you are concerned about your fertility. What side effects may I notice from receiving this medicine? Side effects that you should report to your doctor or health care professional as soon as possible: -allergic reactions like skin rash, itching or hives, swelling of the face, lips, or tongue -changes in emotions or moods -diarrhea -low blood counts - this medicine may decrease the number of white blood cells, red blood cells and platelets. You may be at increased risk for infections and bleeding. -pain, tingling, numbness in the hands or feet -redness, blistering, peeling or loosening of the skin, including inside the mouth -shortness of breath -signs of infection - fever or chills, cough, sore throat, pain or difficulty passing urine -signs of decreased platelets or bleeding - bruising, pinpoint red spots on the skin, black, tarry stools, blood in the urine -signs of decreased red blood cells - unusually weak or tired, fainting spells, lightheadedness -signs of liver injury like dark yellow or brown urine; general ill feeling or flu-like symptoms; light-colored stools; loss of appetite; nausea; right upper belly pain; yellowing of the eyes or skin -stomach pain -sudden numbness or weakness of the face, arm or leg -vomiting Side effects that usually do not require medical attention (report to your doctor or health care professional if they continue or are bothersome): -dizziness -headache -muscle pain -tiredness Where should I keep my medicine? This drug is given in a hospital or clinic and will not be stored at home.  2017 Elsevier/Gold  Standard (2016-01-15 12:10:42)   Bendamustine Injection What is this medicine? BENDAMUSTINE (BEN da MUS teen) is a chemotherapy drug. It is used to treat chronic lymphocytic leukemia and non-Hodgkin lymphoma. COMMON BRAND NAME(S): BENDEKA, Treanda What should I tell my health care provider before I take this medicine? They need to know if you have any of these conditions: -infection (especially a virus infection such as chickenpox, cold sores, or herpes) -kidney disease -liver disease -an unusual or allergic reaction to bendamustine, mannitol, other medicines, foods, dyes, or preservatives -pregnant or trying to get pregnant -breast-feeding How should I use this medicine? This medicine is for infusion into a vein. It is given by a health care professional in a hospital or clinic setting. Talk to your pediatrician regarding the use of this medicine in children. Special care may be needed. What if I miss a dose? It is important not to miss your dose. Call your doctor or health care professional if you are unable to keep an appointment. What may interact with this medicine? Do not take this medicine with any of the following medications: -clozapine This medicine may also interact with the following medications: -atazanavir -cimetidine -ciprofloxacin -enoxacin -fluvoxamine -medicines for seizures like carbamazepine and phenobarbital -mexiletine -rifampin -tacrine -thiabendazole -zileuton What should I watch for while using this medicine? This drug may make you feel generally unwell. This is not uncommon, as  chemotherapy can affect healthy cells as well as cancer cells. Report any side effects. Continue your course of treatment even though you feel ill unless your doctor tells you to stop. You may need blood work done while you are taking this medicine. Call your doctor or health care professional for advice if you get a fever, chills or sore throat, or other symptoms of a cold or  flu. Do not treat yourself. This drug decreases your body's ability to fight infections. Try to avoid being around people who are sick. This medicine may increase your risk to bruise or bleed. Call your doctor or health care professional if you notice any unusual bleeding. Talk to your doctor about your risk of cancer. You may be more at risk for certain types of cancers if you take this medicine. Do not become pregnant while taking this medicine or for 3 months after stopping it. Women should inform their doctor if they wish to become pregnant or think they might be pregnant. Men should not father a child while taking this medicine and for 3 months after stopping it.There is a potential for serious side effects to an unborn child. Talk to your health care professional or pharmacist for more information. Do not breast-feed an infant while taking this medicine. This medicine may interfere with the ability to have a child. You should talk with your doctor or health care professional if you are concerned about your fertility. What side effects may I notice from receiving this medicine? Side effects that you should report to your doctor or health care professional as soon as possible: -allergic reactions like skin rash, itching or hives, swelling of the face, lips, or tongue -low blood counts - this medicine may decrease the number of white blood cells, red blood cells and platelets. You may be at increased risk for infections and bleeding. -redness, blistering, peeling or loosening of the skin, including inside the mouth -signs of infection - fever or chills, cough, sore throat, pain or difficulty passing urine -signs of decreased platelets or bleeding - bruising, pinpoint red spots on the skin, black, tarry stools, blood in the urine -signs of decreased red blood cells - unusually weak or tired, fainting spells, lightheadedness -signs and symptoms of kidney injury like trouble passing urine or change in  the amount of urine -signs and symptoms of liver injury like dark yellow or brown urine; general ill feeling or flu-like symptoms; light-colored stools; loss of appetite; nausea; right upper belly pain; unusually weak or tired; yellowing of the eyes or skin Side effects that usually do not require medical attention (report to your doctor or health care professional if they continue or are bothersome): -constipation -decreased appetite -diarrhea -headache -mouth sores -nausea/vomiting -tiredness Where should I keep my medicine? This drug is given in a hospital or clinic and will not be stored at home.  2017 Elsevier/Gold Standard (2015-10-16 08:45:41)

## 2016-12-30 NOTE — Progress Notes (Signed)
[facility] Work  Clinical Social Work was referred by nurse for assessment of psychosocial needs due to transportation concerns and possible assistance needed with ADLs.  Clinical Social Worker met with patient at [facility] ([place] during treatment to offer support and assess for needs.  Pt reports to live with her 32yo, but has three children ages; 38 and 11yo. She reports that one lives with his father and the other two are staying with family while she is sick. She reports her mother helps her at times and her cousin as well. She states her cousin brought her to her appointment today. CSW inquired what type of things she might need help with at home as pt had told nurse she needed help. Pt denied needing help at home. CSW asked additional questions and educated pt on possible resources to assist at home. Pt felt currently she was managing dressing, bathing, feeding herself and cooking. Pt has medicaid and could access CNA services through Medicaid if indicated. Pt denied wanting to get these services started now. CSW educated her that it can take 6-8 weeks to get set up and to please let the team know if she needs further assistance.    CSW discussed with pt at length transportation options. Pt has medicaid currently, there was a delay in getting this in place. Pt states she is aware of how to access medicaid transportation and has the contact number. CSW supplied again today. CSW also completed SCAT application for pt and faxed to the SCAT office. CSW also provided her with ACS number for transportation through [facility]. She also states family can bring her at times. Transportation should not be a barrier to care currently.   CSW provided pt with Support Team contact sheet, calendar and other resources that could assist her. She had the grant, but has used up all of her funds.  She receives housing and food stamp assistance, but covering utilities is a challenge for her. CSW is not aware of local  resources that pt would be eligible for to receive assistance with these concerns. CSW explained this to pt and will attempt to look for additional options. Pt denied receiving [place] services and was unsure who was providing her oxygen tanks at home. This may need to be looked into by RN. CSW shared updates with the team and possible need for [facility] for DME. CSW to follow and submit SCAT application.  Clinical Social Work interventions:  Resource education and referral Supportive listening  Loren Racer, [place]  [place] Phone: [phone] Fax: [phone]

## 2016-12-30 NOTE — Progress Notes (Signed)
[name] Kitchen  HEMATOLOGY ONCOLOGY PROGRESS NOTE  Date of service: .12/30/2016   Patient Care Team: Maren Reamer, MD as PCP - General (Internal Medicine)  Chief complaint: Follow-up for Hodgkin's lymphoma  Diagnosis:   Refractory Mixed cellularity Hodgkin's lymphoma IVBE with extensive lymphadenopathy including right axillary, mediastinal and upper retroperitoneal and now biopsy proven pulmonary involvement. She was noted to have significant constitutional symptoms including significant weight loss, fevers chills and some night sweats.  Current Treatment:  Starting on 2nd line therapy with Bendamustine + Brentuximab  Previous treatment 5 cycle of AVD (without bleomycin due to lung involvement and DLCO of 37%, active smoker) Multiple avoidable treatment delays due to the patient's noncompliance with follow-up for avoidable reasons. She has been counseled repeatedly that this would increase the likelihood of unfavorable outcome.  INTERVAL HISTORY:  Ms Parisian had 2 recent hospitalizations with respiratory distress. Had a bronchoscopy with Bx that confirmed progression of her Hodgkins Lymphoma. After her 1st recent hospitalization she was scheduled for follow-up in clinic immediately and missed several clinic appointments despite repeated calls from the clinic regarding the urgency of starting second line therapy for her Hodgkin's lymphoma. Patient has had a history of noncompliance with medical follow-up and this appears to be getting worse.  She was then admitted with worsening respiratory failure with significantly compromise right lung and with SVC/central venous obstruction with a right-sided face swelling.  We had a frank discussion regarding what her treatment intentions are. Whether she would like to focus on being kept comfortable and not pursue any treatment and go on hospice vs having aggressive treatment for her potentially curable CHL. She understands the concern with her disease  progression and that she will need very close for fairly involved treatment and likely referral for consideration of an autologous transplant. She underwent RT to her rt lung and mediastinal mass due to SVC obstruction with rt facial and rt upper extremity swelling. She has been started on anticoagulation to inability to know if there was a related SVC thrombosis. Rt IJ port was removed. Patient refused to start inpatient chemotherapy and also missed her clinic followup yesterday.  My RN called her to offer hospice since she was not following up for treatment and she decided that she will f/u. Our social worker has engaged her with multiple options for transportation. She is now agreeable to get a referral to [facility] for a AutoHS[facility] evaluation. Had chemo-counseling about this regimen in the hospital by me and chemo -nurses and is agreeable to proceed today. She notes her rt facial and RUE swelling is a little better. Was on lovenox and discharged on Xarelto which she notes she is taking. Still on 4L/min [place] oyxygen. Low grade fever 100.21F - likely from [facility] but low threshold to consider infectious workup and empiric treatment.  REVIEW OF SYSTEMS:    10 Point review of systems of done and is negative except as noted above.  . Past Medical History:  Diagnosis Date  . ARDS (adult respiratory distress syndrome) ([place])   . Asthma   . Hodgkin lymphoma ([place])   . Hypertension     . Past Surgical History:  Procedure Laterality Date  . AXILLARY LYMPH NODE BIOPSY Right 03/19/2016   Procedure: AXILLARY LYMPH NODE BIOPSY;  Surgeon: Armandina Gemma, MD;  Location: [facility];  Service: General;  Laterality: Right;  . IR GENERIC HISTORICAL  12/22/2016   IR FLUORO GUIDE PORT INSERTION LEFT 12/22/2016 [facility]  . IR GENERIC HISTORICAL  12/22/2016  IR US GUIDE VASC ACCESS LEFT 12/22/2016 [facility]  . IR GENERIC HISTORICAL  12/22/2016   IR CV LINE INJECTION 12/22/2016 [facility]  . IR GENERIC HISTORICAL   12/22/2016   IR REMOVAL TUN ACCESS W/ PORT W/O FL MOD SED 12/22/2016 [facility]  . VIDEO BRONCHOSCOPY Bilateral 11/26/2016   Procedure: VIDEO BRONCHOSCOPY WITH FLUORO;  Surgeon: Rigoberto Noel, MD;  Location: [facility] ENDOSCOPY;  Service: Cardiopulmonary;  Laterality: Bilateral;    . Social History  Substance Use Topics  . Smoking status: Former Smoker    Packs/day: 0.50    Years: 15.00    Types: Cigarettes    Quit date: 03/27/2016  . Smokeless tobacco: Never Used  . Alcohol use Yes     Comment: occasional now    ALLERGIES:  has No Known Allergies.  MEDICATIONS:  Current Outpatient Prescriptions  Medication Sig Dispense Refill  . dexamethasone (DECADRON) 4 MG tablet 2 tab (8mg ) twice a day for 3 days starting on day of chemotherapy 30 tablet 3  . guaiFENesin (ROBITUSSIN) 100 MG/5ML SOLN Take 5 mLs (100 mg total) by mouth every 6 (six) hours as needed for cough or to loosen phlegm. 1200 mL 0  . labetalol (NORMODYNE) 200 MG tablet Take 1 tablet (200 mg total) by mouth 3 (three) times daily. 90 tablet 1  . levalbuterol (XOPENEX HFA) 45 MCG/ACT inhaler Inhale 2 puffs into the lungs every 6 (six) hours as needed for wheezing. 1 Inhaler 12  . levofloxacin (LEVAQUIN) 750 MG tablet Take 1 tablet (750 mg total) by mouth daily. 3 tablet 0  . lidocaine-prilocaine (EMLA) cream Apply 1 application topically as needed (numbing).     [name] Kitchen loratadine (CLARITIN) 10 MG tablet Take 1 tablet (10 mg total) by mouth daily. For up to 5 days following Neulasta injection. 5 tablet 2  . ondansetron (ZOFRAN) 8 MG tablet Take 1 tablet (8 mg total) by mouth every 8 (eight) hours as needed for nausea (start on the 3rd day after chemotherapy). 30 tablet 3  . pantoprazole (PROTONIX) 40 MG tablet Take 1 tablet (40 mg total) by mouth daily. 30 tablet 1  . prochlorperazine (COMPAZINE) 10 MG tablet Take 1 tablet (10 mg total) by mouth every 6 (six) hours as needed for nausea or vomiting. 30 tablet 3  . Rivaroxaban (XARELTO) 15  MG TABS tablet Take 1 tablet (15 mg total) by mouth 2 (two) times daily with a meal. 42 tablet 0  . [START ON 01/13/2017] rivaroxaban (XARELTO) 20 MG TABS tablet Take 1 tablet (20 mg total) by mouth daily with supper. 30 tablet 1   No current facility-administered medications for this visit.    Facility-Administered Medications Ordered in Other Visits  Medication Dose Route Frequency Provider Last Rate Last Dose  . sodium chloride flush (NS) 0.9 % injection 10 mL  10 mL Intracatheter PRN Brunetta Genera, MD   10 mL at 12/30/16 1127    PHYSICAL EXAMINATION: ECOG PERFORMANCE STATUS: 1 - Symptomatic but completely ambulatory  . There were no vitals filed for this visit.  There were no vitals filed for this visit. .There is no height or weight on file to calculate BMI.  OROPHARYNX: Moist mucous membranes No thrush NECK: supple, no JVD LYMPH:No palpable cervical , axillary or inguinal LNadenoapthy LUNGS:  improved air entry and with no rales and only scattered rhonci HEART: regular rate & rhythm ABDOMEN: abdomen soft, nontender, normoactive bowel sounds Extremities no edema  NEURO: Awake alert and oriented 3, moving  all 4 extremities. No overt focal neurological deficit.  LABORATORY DATA:   I have reviewed the data as listed  . CBC Latest Ref Rng & Units 12/30/2016 12/22/2016 12/21/2016  WBC 3.9 - 10.3 10e3/uL 12.8(H) 12.5(H) 14.3(H)  Hemoglobin 11.6 - 15.9 g/dL 10.7(L) 10.5(L) 10.6(L)  Hematocrit 34.8 - 46.6 % 33.1(L) 33.1(L) 33.5(L)  Platelets 145 - 400 10e3/uL 231 508(H) 582(H)   . CBC    Component Value Date/Time   WBC 12.8 (H) 12/30/2016 0812   WBC 12.5 (H) 12/22/2016 0509   RBC 3.59 (L) 12/30/2016 0812   RBC 3.44 (L) 12/22/2016 0509   HGB 10.7 (L) 12/30/2016 0812   HCT 33.1 (L) 12/30/2016 0812   PLT 231 12/30/2016 0812   MCV 92.2 12/30/2016 0812   MCH 29.8 12/30/2016 0812   MCH 30.5 12/22/2016 0509   MCHC 32.3 12/30/2016 0812   MCHC 31.7 12/22/2016 0509    RDW 16.4 (H) 12/30/2016 0812   LYMPHSABS 0.3 (L) 12/30/2016 0812   MONOABS 1.2 (H) 12/30/2016 0812   EOSABS 0.1 12/30/2016 0812   BASOSABS 0.0 12/30/2016 0812    . CMP Latest Ref Rng & Units 12/30/2016 12/21/2016 12/18/2016  Glucose 70 - 140 mg/dl 101 143(H) 127(H)  BUN 7.0 - 26.0 mg/dL 4.6(L) 11 9  Creatinine 0.6 - 1.1 mg/dL 0.5(L) 0.50 0.43(L)  Sodium 136 - 145 mEq/L 135(L) 139 138  Potassium 3.5 - 5.1 mEq/L 4.2 4.1 4.4  Chloride 101 - 111 mmol/L - 96(L) 97(L)  CO2 22 - 29 mEq/L 33(H) 36(H) 35(H)  Calcium 8.4 - 10.4 mg/dL 8.4 8.2(L) 8.3(L)  Total Protein 6.4 - 8.3 g/dL 4.5(L) - -  Total Bilirubin 0.20 - 1.20 mg/dL 0.51 - -  Alkaline Phos 40 - 150 U/L 118 - -  AST 5 - 34 U/L 10 - -  ALT 0 - 55 U/L 13 - -    RADIOGRAPHIC STUDIES: I have personally reviewed the radiological images as listed and agreed with the findings in the report. Dg Chest 2 View  Result Date: 12/18/2016 CLINICAL DATA:  Worsening shortness of breath x3 weeks, history of Hodgkin's lymphoma, cough, weakness EXAM: CHEST  2 VIEW COMPARISON:  CT chest dated 12/15/2016 FINDINGS: Near complete opacification of the right lung, possibly reflecting a combination of lymphoma and pneumonia, better evaluated on CT. Mild patchy left upper lobe opacity, suspicious for pneumonia when correlating with CT. Malpositioned right chest port is looped in the neck and likely terminates in the right internal jugular vein. IMPRESSION: Near complete opacification of the right lung, possibly reflecting combination of lymphoma and pneumonia, better evaluated on CT. Mild patchy left upper lobe opacity, suspicious for pneumonia. Malpositioned right chest port is looped in the neck and likely terminates in the right internal jugular vein, unchanged. Removal is suggested. Electronically Signed   By: Julian Hy M.D.   On: 12/18/2016 12:06   Dg Chest 2 View  Result Date: 12/14/2016 CLINICAL DATA:  Shortness of breath for 1 week. History of  Hodgkin's lymphoma. EXAM: CHEST  2 VIEW COMPARISON:  11/26/2016 and 11/23/2016 FINDINGS: There is now complete opacification of the right hemithorax. Based on the previous CT, this is probably related to consolidation. Pleural fluid cannot be excluded. Again noted are patchy densities in the mid left lung which could be infectious but worrisome for a neoplastic process. Heart size is grossly stable. Trachea is mildly deviated towards the left. Patient is a right jugular Port-A-Cath with the tip in the right innominate vein region.  The catheter tip has pulled back since the original placement images and there is a small loop within the right internal jugular vein. IMPRESSION: Complete opacification of the right hemithorax. Based on the previous chest CT, this probably represents diffuse consolidation. Pleural fluid cannot be excluded. Persistent nodular density in the mid left lung. This has minimally changed since 11/26/2016. Although this could be infectious, neoplastic process cannot be excluded in the left lung. The right jugular Port-A-Cath tip remains in the right innominate vein. However, a portion of the tube is coiled in the right internal jugular vein which is not ideal and consider removal and/or repositioning. Electronically Signed   By: Markus Daft M.D.   On: 12/14/2016 19:39   Ct Chest W Contrast  Result Date: 12/15/2016 CLINICAL DATA:  31 year old female with lung mass and right chest and arm and breast swelling. 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 EXAM: CT CHEST WITH CONTRAST TECHNIQUE: Multidetector CT imaging of the chest was performed during intravenous contrast administration. CONTRAST:  75 cc [id number] COMPARISON:  Chest radiograph dated 12/14/2016 and CT dated 11/23/2016 FINDINGS: Cardiovascular: There is no cardiomegaly or pericardial effusion. There is mild shift of the heart and mediastinum into the left hemithorax secondary to mass effect caused by right lung mass/consolidation. The thoracic  aorta appears unremarkable. The origins of the great vessels of the aortic arch appear patent. There is dilatation of the main pulmonary trunk indicative of underlying pulmonary hypertension. Evaluation for pulmonary embolism is very limited due to suboptimal opacification of the vasculature and respiratory motion artifact. No filling defect identified within the pulmonary trunk or central portion of the main pulmonary arteries. Right pectoral Port-A-Cath again noted which extends up into the right IJ. The catheter then turns down with tip in the right brachiocephalic vein in similar appearance as the prior CT. There is apparent occlusion of the right brachiocephalic vein as well as a degree of occlusion or stricture of the right IJ. There is non opacification of the right subclavian vein. Mediastinum/Nodes: Anterior mediastinal mass/ lymphadenopathy measures 2.9 x 3.6 cm (previously approximately 2.1 x 3.9 cm). Right paratracheal adenopathy measures up to 16 mm in short axis (previously 13 mm). Subcarinal adenopathy measures 17 mm in short axis grossly similar prior study. The esophagus is not well visualized. Lungs/Pleura: There has been interval progression of right lung consolidation now involving the entire right lung. There is compression and occlusion of the distal portion of the right mainstem bronchus. There is overall increased right lung volume with mass effect and mild displacement of the mediastinum into the left hemithorax. Multiple nodular densities in the left lung predominantly involving the left upper lobe have increased in size since the prior CT. The largest nodule or confluence of nodules measures approximately 17 x 15 mm (previously approximately 10 x 8 mm). These may be infectious in etiology or represent metastatic disease. There is a small right pleural effusion. No pleural effusion noted on the left. There is no pneumothorax. Upper Abdomen: There is retroperitoneal adenopathy in the upper  abdomen. Partially visualized perihepatic ascites. Musculoskeletal: Diffuse subcutaneous edema and anasarca. No drainable fluid collection. IMPRESSION: Interval progression of right lung mass with complete opacification of the right lung. Interval increase in the size of the left lung nodules which may represent progression of pneumonia or neoplastic disease. Clinical correlation is recommended. Non opacification of the right brachiocephalic vein and right IJ concerning for occlusion of these vessels. Progression of mediastinal adenopathy. Upper abdominal retroperitoneal adenopathy. Small right pleural effusion, partially  visualized ascites, diffuse subcutaneous edema and anasarca. Electronically Signed   By: Anner Crete M.D.   On: 12/15/2016 06:39   Ir Removal [employer] W/ [place] W/o Fl Mod Sed  Result Date: 12/22/2016 CLINICAL DATA:  Hodgkin's lymphoma. Malpositioned right IJ port catheter with venous occlusion. Recent placement of new left IJ port. EXAM: EXAM TUNNELED PORT CATHETER REMOVAL TECHNIQUE: The procedure, risks (including but not limited to bleeding, infection, organ damage ), benefits, and alternatives were explained to the patient. Questions regarding the procedure were encouraged and answered. The patient understands and consents to the procedure. Intravenous Fentanyl and Versed were administered as conscious sedation during continuous monitoring of the patient's level of consciousness and physiological / cardiorespiratory status by the radiology RN, with a total moderate sedation time of 40 minutes. Overlying skin prepped with chlorhexidine, draped in usual sterile fashion, infiltrated locally with 1% lidocaine. A small incision was made over the scar from previous placement. The port catheter was dissected free from the underlying soft tissues and removed intact. Hemostasis was achieved. The port pocket was closed with deep interrupted and subcuticular continuous 3-0 Monocryl sutures, then  covered with Dermabond. The patient tolerated the procedure well. COMPLICATIONS: COMPLICATIONS None immediate IMPRESSION: 1.  Technically successful tunneled Port catheter removal. Electronically Signed   By: Lucrezia Europe M.D.   On: 12/22/2016 17:06   Ir Cv Line Injection  Result Date: 12/22/2016 CLINICAL DATA:  Hodgkin's lymphoma. Right lung mass and mediastinal adenopathy. Right IJ port catheter that I placed 05/17/2016, found on radiography to be malpositioned into the right IJ vein. CT suggests right IJ and brachiocephalic vein occlusion. EXAM: PORT  CATHETER INJECTION UNDER FLUOROSCOPY TECHNIQUE: The procedure, risks (including but not limited to bleeding, infection, organ damage ), benefits, and alternatives were explained to the patient. Questions regarding the procedure were encouraged and answered. The patient understands and consents to the procedure. Survey fluoroscopic inspection reveals buckling of the intravascular portion of the port catheter into the right IJ vein, the tip retracted up to the level of the confluence of the IJ and subclavian veins. Injection demonstrates patency of the port reservoir and catheter. There is occlusion of the right IJ vein. There is occlusion of the right brachiocephalic vein. The injected contrast drains via small thyrocervical collaterals. No extravasation. IMPRESSION: 1. Malpositioned right IJ port catheter with IJ and right brachiocephalic vein occlusion. Revision is scheduled. Electronically Signed   By: Lucrezia Europe M.D.   On: 12/22/2016 17:05   Ir US Guide Vasc Access Left  Result Date: 12/22/2016 CLINICAL DATA:  Hodgkin's lymphoma. Needs durable long-term venous access for treatment regimen. Malpositioned right IJ port with occluded right IJ and brachiocephalic veins. EXAM: TUNNELED PORT CATHETER PLACEMENT WITH ULTRASOUND AND FLUOROSCOPIC GUIDANCE FLUOROSCOPY TIME:  30 seconds, 5 mGy ANESTHESIA/SEDATION: Intravenous Fentanyl and Versed were administered as  conscious sedation during continuous monitoring of the patient's level of consciousness and physiological / cardiorespiratory status by the radiology RN, with a total moderate sedation time of 40 minutes. TECHNIQUE: The procedure, risks, benefits, and alternatives were explained to the patient. Questions regarding the procedure were encouraged and answered. The patient understands and consents to the procedure. As antibiotic prophylaxis, vancomycin 1 g was ordered pre-procedure and administered intravenously within one hour of incision. Patency of the LEFT IJ vein was confirmed with ultrasound with image documentation. An appropriate skin site was determined. Skin site was marked. Region was prepped using maximum barrier technique including cap and mask, sterile gown, sterile gloves, large  sterile sheet, and Chlorhexidine as cutaneous antisepsis. The region was infiltrated locally with 1% lidocaine. Under real-time ultrasound guidance, the LEFT IJ vein was accessed with a 21 gauge micropuncture needle; the needle tip within the vein was confirmed with ultrasound image documentation. Needle was exchanged over a 018 guidewire for transitional dilator which allowed passage of the [facility] wire into the IVC. Over this, the transitional dilator was exchanged for a 5 [place] MPA catheter. A small incision was made on the LEFT anterior chest wall and a subcutaneous pocket fashioned. The power-injectable port was positioned and its catheter tunneled to the LEFT IJ dermatotomy site. The MPA catheter was exchanged over an Amplatz wire for a peel-away sheath, through which the port catheter, which had been trimmed to the appropriate length, was advanced and positioned under fluoroscopy with its tip at the cavoatrial junction. Spot chest radiograph confirms good catheter position and no pneumothorax. The pocket was closed with deep interrupted and subcuticular continuous 3-0 Monocryl sutures. The port was flushed per protocol. The  incisions were covered with Dermabond then covered with a sterile dressing. COMPLICATIONS: COMPLICATIONS None immediate IMPRESSION: Technically successful LEFT IJ power-injectable port catheter placement. Ready for routine use. Electronically Signed   By: Lucrezia Europe M.D.   On: 12/22/2016 17:03   Ir Fluoro Guide Port Insertion Left  Result Date: 12/22/2016 CLINICAL DATA:  Hodgkin's lymphoma. Needs durable long-term venous access for treatment regimen. Malpositioned right IJ port with occluded right IJ and brachiocephalic veins. EXAM: TUNNELED PORT CATHETER PLACEMENT WITH ULTRASOUND AND FLUOROSCOPIC GUIDANCE FLUOROSCOPY TIME:  30 seconds, 5 mGy ANESTHESIA/SEDATION: Intravenous Fentanyl and Versed were administered as conscious sedation during continuous monitoring of the patient's level of consciousness and physiological / cardiorespiratory status by the radiology RN, with a total moderate sedation time of 40 minutes. TECHNIQUE: The procedure, risks, benefits, and alternatives were explained to the patient. Questions regarding the procedure were encouraged and answered. The patient understands and consents to the procedure. As antibiotic prophylaxis, vancomycin 1 g was ordered pre-procedure and administered intravenously within one hour of incision. Patency of the LEFT IJ vein was confirmed with ultrasound with image documentation. An appropriate skin site was determined. Skin site was marked. Region was prepped using maximum barrier technique including cap and mask, sterile gown, sterile gloves, large sterile sheet, and Chlorhexidine as cutaneous antisepsis. The region was infiltrated locally with 1% lidocaine. Under real-time ultrasound guidance, the LEFT IJ vein was accessed with a 21 gauge micropuncture needle; the needle tip within the vein was confirmed with ultrasound image documentation. Needle was exchanged over a 018 guidewire for transitional dilator which allowed passage of the [facility] wire into the  IVC. Over this, the transitional dilator was exchanged for a 5 [place] MPA catheter. A small incision was made on the LEFT anterior chest wall and a subcutaneous pocket fashioned. The power-injectable port was positioned and its catheter tunneled to the LEFT IJ dermatotomy site. The MPA catheter was exchanged over an Amplatz wire for a peel-away sheath, through which the port catheter, which had been trimmed to the appropriate length, was advanced and positioned under fluoroscopy with its tip at the cavoatrial junction. Spot chest radiograph confirms good catheter position and no pneumothorax. The pocket was closed with deep interrupted and subcuticular continuous 3-0 Monocryl sutures. The port was flushed per protocol. The incisions were covered with Dermabond then covered with a sterile dressing. COMPLICATIONS: COMPLICATIONS None immediate IMPRESSION: Technically successful LEFT IJ power-injectable port catheter placement. Ready for routine use. Electronically Signed  By: Lucrezia Europe M.D.   On: 12/22/2016 17:03    ASSESSMENT & PLAN:   31 year old African-American female with  #1 refractory/Progressive Mixed cellularity Hodgkin's lymphoma IV B E with extensive lymphadenopathy including right axillary, mediastinal and upper retroperitoneal and now biopsy proven pulmonary involvement. She was noted to have significant constitutional symptoms including significant weight loss, fevers chills and some night sweats. HIV negative Hepatitis C and hepatitis B serologies negative. Echo with normal ejection fraction. Patient was treated with 5 cycles of AVD (Bleomycin held due to poor DLCO 37% and ongoing smoking). Multiple avoidable treatment delays due to the patient's noncompliance with follow-up for avoidable reasons. She has been counseled repeatedly that this would increase the likelihood of unfavorable outcome.  Now with progressive CHL with Pulmonary involvement and SVC syndrome.  #2 hypoxic respiratory  failure with dense right lung consolidation and left upper lobe consolidation with SVC syndrome. Patient has completed palliative radiation to the right lung mass causing SVC compression and has had her port removed. Is on anticoagulation with Xarelto for possible thrombus.  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  #4 Non compliance with clinic and treatment followup. Plan -PET/CT for re-evaluation of overall disease status -starting Bendamustine/Brentuximab for progression of CHL C1D1 today -referral to [facility] for evaluation regarding [facility] candidacy to determine options for further course of action. -counseled patient to maintain followup compliance and SW gave her several transportation options. -continue Xarelto at this time -She was again counseled regarding absolute smoking cessation   Return to care with Dr. Irene Limbo in 7-10 days to review PET/CT and for a toxicity check.  I spent 35 minutes counseling the patient face to face. The total time spent in the appointment was 40 minutes and more than 50% was on counseling and direct patient cares.    Sullivan Lone MD Lorenzo AAHIVMS [facility] Hematology/Oncology Physician [facility]  (Office):       [phone] (Work cell):  [phone] (Fax):           [phone]

## 2016-12-30 NOTE — Telephone Encounter (Signed)
Per Dr. Irene Limbo, [facility] contacted to set up consult for transplant.  Per Margreta Journey in scheduling it would take 5-7 days for financial approval d/t medicaid status.  [facility] to contact us with information on approval.  [phone]  Fax: [phone]

## 2016-12-31 ENCOUNTER — Ambulatory Visit (HOSPITAL_BASED_OUTPATIENT_CLINIC_OR_DEPARTMENT_OTHER): Payer: Medicaid Other

## 2016-12-31 VITALS — BP 132/90 | HR 138 | Temp 99.2°F | Resp 20

## 2016-12-31 DIAGNOSIS — C8128 Mixed cellularity classical Hodgkin lymphoma, lymph nodes of multiple sites: Secondary | ICD-10-CM | POA: Diagnosis not present

## 2016-12-31 DIAGNOSIS — I871 Compression of vein: Secondary | ICD-10-CM | POA: Diagnosis present

## 2016-12-31 DIAGNOSIS — Z5111 Encounter for antineoplastic chemotherapy: Secondary | ICD-10-CM

## 2016-12-31 MED ORDER — DIPHENHYDRAMINE HCL 50 MG/ML IJ SOLN
50.0000 mg | Freq: Once | INTRAMUSCULAR | Status: AC
  Administered 2016-12-31: 50 mg via INTRAVENOUS

## 2016-12-31 MED ORDER — SODIUM CHLORIDE 0.9 % IJ SOLN
10.0000 mL | Freq: Once | INTRAMUSCULAR | Status: AC
  Administered 2016-12-31: 10 mL via INTRAVENOUS
  Filled 2016-12-31: qty 10

## 2016-12-31 MED ORDER — ACETAMINOPHEN 325 MG PO TABS
650.0000 mg | ORAL_TABLET | Freq: Once | ORAL | Status: AC
  Administered 2016-12-31: 650 mg via ORAL

## 2016-12-31 MED ORDER — ACETAMINOPHEN 325 MG PO TABS
ORAL_TABLET | ORAL | Status: AC
  Filled 2016-12-31: qty 2

## 2016-12-31 MED ORDER — HEPARIN SOD (PORK) LOCK FLUSH 100 UNIT/ML IV SOLN
500.0000 [IU] | Freq: Once | INTRAVENOUS | Status: AC | PRN
  Administered 2016-12-31: 500 [IU]
  Filled 2016-12-31: qty 5

## 2016-12-31 MED ORDER — DEXAMETHASONE SODIUM PHOSPHATE 10 MG/ML IJ SOLN
INTRAMUSCULAR | Status: AC
  Filled 2016-12-31: qty 1

## 2016-12-31 MED ORDER — DEXAMETHASONE SODIUM PHOSPHATE 10 MG/ML IJ SOLN
10.0000 mg | Freq: Once | INTRAMUSCULAR | Status: AC
  Administered 2016-12-31: 10 mg via INTRAVENOUS

## 2016-12-31 MED ORDER — SODIUM CHLORIDE 0.9% FLUSH
10.0000 mL | INTRAVENOUS | Status: DC | PRN
  Administered 2016-12-31: 10 mL
  Filled 2016-12-31: qty 10

## 2016-12-31 MED ORDER — SODIUM CHLORIDE 0.9 % IV SOLN
Freq: Once | INTRAVENOUS | Status: AC
  Administered 2016-12-31: 14:00:00 via INTRAVENOUS

## 2016-12-31 MED ORDER — SODIUM CHLORIDE 0.9 % IV SOLN
90.0000 mg/m2 | Freq: Once | INTRAVENOUS | Status: AC
  Administered 2016-12-31: 175 mg via INTRAVENOUS
  Filled 2016-12-31: qty 7

## 2016-12-31 MED ORDER — DIPHENHYDRAMINE HCL 50 MG/ML IJ SOLN
INTRAMUSCULAR | Status: AC
Start: 2016-12-31 — End: 2016-12-31
  Filled 2016-12-31: qty 1

## 2016-12-31 MED ORDER — HEPARIN SOD (PORK) LOCK FLUSH 100 UNIT/ML IV SOLN
250.0000 [IU] | Freq: Once | INTRAVENOUS | Status: DC | PRN
  Filled 2016-12-31: qty 5

## 2016-12-31 NOTE — Patient Instructions (Signed)
[facility] Discharge Instructions for Patients Receiving Chemotherapy  Today you received the following chemotherapy agents: Bendamustine   To help prevent nausea and vomiting after your treatment, we encourage you to take your nausea medication as prescribed.    If you develop nausea and vomiting that is not controlled by your nausea medication, call the clinic.   BELOW ARE SYMPTOMS THAT SHOULD BE REPORTED IMMEDIATELY:  *FEVER GREATER THAN 100.5 F  *CHILLS WITH OR WITHOUT FEVER  NAUSEA AND VOMITING THAT IS NOT CONTROLLED WITH YOUR NAUSEA MEDICATION  *UNUSUAL SHORTNESS OF BREATH  *UNUSUAL BRUISING OR BLEEDING  TENDERNESS IN MOUTH AND THROAT WITH OR WITHOUT PRESENCE OF ULCERS  *URINARY PROBLEMS  *BOWEL PROBLEMS  UNUSUAL RASH Items with * indicate a potential emergency and should be followed up as soon as possible.  Feel free to call the clinic you have any questions or concerns. The clinic phone number is [phone].  Please show the CHEMO ALERT CARD at check-in to the Emergency Department and triage nurse.   

## 2017-01-02 NOTE — Addendum Note (Signed)
Encounter addended by: Tyler Pita, MD on: 01/02/2017  3:49 PM<BR>    Actions taken: Problem List reviewed

## 2017-01-04 ENCOUNTER — Telehealth: Payer: Self-pay | Admitting: Hematology

## 2017-01-04 NOTE — Telephone Encounter (Signed)
I left a message to call back and confirm 1/11 appointments

## 2017-01-06 ENCOUNTER — Other Ambulatory Visit: Payer: Medicaid Other

## 2017-01-06 ENCOUNTER — Ambulatory Visit: Payer: Medicaid Other | Admitting: Hematology

## 2017-01-09 LAB — ACID FAST CULTURE WITH REFLEXED SENSITIVITIES: ACID FAST CULTURE - AFSCU3: NEGATIVE

## 2017-01-10 ENCOUNTER — Encounter: Payer: Self-pay | Admitting: *Deleted

## 2017-01-10 ENCOUNTER — Ambulatory Visit: Payer: Medicaid Other | Admitting: Hematology

## 2017-01-10 ENCOUNTER — Other Ambulatory Visit: Payer: Medicaid Other

## 2017-01-12 ENCOUNTER — Ambulatory Visit (HOSPITAL_COMMUNITY): Admission: RE | Admit: 2017-01-12 | Payer: Medicaid Other | Source: Ambulatory Visit

## 2017-01-19 ENCOUNTER — Encounter: Payer: Self-pay | Admitting: *Deleted

## 2017-01-19 NOTE — Progress Notes (Signed)
Pt has missed several MD apts after several attempts to meet pt scheduling/transportation needs.  Per Dr. Irene Limbo, letter sent on 01/10/17 explaining the importance of physician follow up during chemotherapy.  Per Dr. Irene Limbo, pt will not receive chemotherapy if she does not contact us to set up MD apts.  Please see letters for complete letter sent to pt.

## 2017-01-20 ENCOUNTER — Ambulatory Visit (HOSPITAL_BASED_OUTPATIENT_CLINIC_OR_DEPARTMENT_OTHER): Payer: Medicaid Other | Admitting: Nurse Practitioner

## 2017-01-20 ENCOUNTER — Telehealth: Payer: Self-pay | Admitting: *Deleted

## 2017-01-20 ENCOUNTER — Encounter: Payer: Self-pay | Admitting: Hematology

## 2017-01-20 ENCOUNTER — Other Ambulatory Visit: Payer: Self-pay | Admitting: *Deleted

## 2017-01-20 ENCOUNTER — Ambulatory Visit (HOSPITAL_BASED_OUTPATIENT_CLINIC_OR_DEPARTMENT_OTHER): Payer: Medicaid Other

## 2017-01-20 ENCOUNTER — Encounter: Payer: Self-pay | Admitting: Nurse Practitioner

## 2017-01-20 ENCOUNTER — Ambulatory Visit (HOSPITAL_BASED_OUTPATIENT_CLINIC_OR_DEPARTMENT_OTHER): Payer: Medicaid Other | Admitting: Hematology

## 2017-01-20 ENCOUNTER — Telehealth: Payer: Self-pay | Admitting: Hematology

## 2017-01-20 VITALS — BP 120/65 | HR 101 | Temp 98.9°F | Resp 16 | Ht 67.0 in | Wt 130.8 lb

## 2017-01-20 VITALS — BP 122/56 | HR 107 | Resp 18

## 2017-01-20 DIAGNOSIS — C8128 Mixed cellularity classical Hodgkin lymphoma, lymph nodes of multiple sites: Secondary | ICD-10-CM

## 2017-01-20 DIAGNOSIS — Z7901 Long term (current) use of anticoagulants: Secondary | ICD-10-CM | POA: Diagnosis not present

## 2017-01-20 DIAGNOSIS — Z5112 Encounter for antineoplastic immunotherapy: Secondary | ICD-10-CM

## 2017-01-20 DIAGNOSIS — I871 Compression of vein: Secondary | ICD-10-CM

## 2017-01-20 DIAGNOSIS — Z5111 Encounter for antineoplastic chemotherapy: Secondary | ICD-10-CM | POA: Diagnosis not present

## 2017-01-20 DIAGNOSIS — L299 Pruritus, unspecified: Secondary | ICD-10-CM | POA: Insufficient documentation

## 2017-01-20 LAB — COMPREHENSIVE METABOLIC PANEL
ALT: 9 U/L (ref 0–55)
AST: 10 U/L (ref 5–34)
Albumin: 2 g/dL — ABNORMAL LOW (ref 3.5–5.0)
Alkaline Phosphatase: 106 U/L (ref 40–150)
Anion Gap: 7 mEq/L (ref 3–11)
BUN: 4.3 mg/dL — ABNORMAL LOW (ref 7.0–26.0)
CO2: 32 mEq/L — ABNORMAL HIGH (ref 22–29)
Calcium: 8.5 mg/dL (ref 8.4–10.4)
Chloride: 96 mEq/L — ABNORMAL LOW (ref 98–109)
Creatinine: 0.6 mg/dL (ref 0.6–1.1)
EGFR: >90 mL/min/{1.73_m2} (ref >90)
Glucose: 118 mg/dl (ref 70–140)
Potassium: 3.8 mEq/L (ref 3.5–5.1)
Sodium: 135 mEq/L — ABNORMAL LOW (ref 136–145)
Total Bilirubin: 0.4 mg/dL (ref 0.20–1.20)
Total Protein: 5.5 g/dL — ABNORMAL LOW (ref 6.4–8.3)

## 2017-01-20 LAB — CBC & DIFF AND RETIC
BASO%: 0.3 % (ref 0.0–2.0)
Basophils Absolute: 0 10*3/uL (ref 0.0–0.1)
EOS%: 0.7 % (ref 0.0–7.0)
Eosinophils Absolute: 0.1 10*3/uL (ref 0.0–0.5)
HCT: 31.2 % — ABNORMAL LOW (ref 34.8–46.6)
HGB: 10.2 g/dL — ABNORMAL LOW (ref 11.6–15.9)
Immature Retic Fract: 5.3 % (ref 1.60–10.00)
LYMPH#: 0.6 10*3/uL — AB (ref 0.9–3.3)
LYMPH%: 5.6 % — ABNORMAL LOW (ref 14.0–49.7)
MCH: 29.1 pg (ref 25.1–34.0)
MCHC: 32.7 g/dL (ref 31.5–36.0)
MCV: 88.9 fL (ref 79.5–101.0)
MONO#: 0.7 10*3/uL (ref 0.1–0.9)
MONO%: 7.4 % (ref 0.0–14.0)
NEUT#: 8.7 10*3/uL — ABNORMAL HIGH (ref 1.5–6.5)
NEUT%: 86 % — ABNORMAL HIGH (ref 38.4–76.8)
PLATELETS: 398 10*3/uL (ref 145–400)
RBC: 3.51 10*6/uL — AB (ref 3.70–5.45)
RDW: 16.3 % — AB (ref 11.2–14.5)
RETIC CT ABS: 76.52 10*3/uL (ref 33.70–90.70)
Retic %: 2.18 % — ABNORMAL HIGH (ref 0.70–2.10)
WBC: 10.1 10*3/uL (ref 3.9–10.3)

## 2017-01-20 LAB — LACTATE DEHYDROGENASE: LDH: 336 U/L — ABNORMAL HIGH (ref 125–245)

## 2017-01-20 MED ORDER — FAMOTIDINE IN NACL 20-0.9 MG/50ML-% IV SOLN
INTRAVENOUS | Status: AC
  Filled 2017-01-20: qty 50

## 2017-01-20 MED ORDER — SODIUM CHLORIDE 0.9% FLUSH
10.0000 mL | INTRAVENOUS | Status: DC | PRN
  Administered 2017-01-20 (×2): 10 mL
  Filled 2017-01-20: qty 10

## 2017-01-20 MED ORDER — BENDAMUSTINE HCL CHEMO INJECTION 100 MG/4ML
90.0000 mg/m2 | Freq: Once | INTRAVENOUS | Status: AC
  Administered 2017-01-20: 175 mg via INTRAVENOUS
  Filled 2017-01-20: qty 7

## 2017-01-20 MED ORDER — DIPHENHYDRAMINE HCL 50 MG/ML IJ SOLN
INTRAMUSCULAR | Status: AC
  Filled 2017-01-20: qty 1

## 2017-01-20 MED ORDER — HEPARIN SOD (PORK) LOCK FLUSH 100 UNIT/ML IV SOLN
500.0000 [IU] | Freq: Once | INTRAVENOUS | Status: AC | PRN
  Administered 2017-01-20: 500 [IU]
  Filled 2017-01-20: qty 5

## 2017-01-20 MED ORDER — DEXAMETHASONE SODIUM PHOSPHATE 10 MG/ML IJ SOLN
10.0000 mg | Freq: Once | INTRAMUSCULAR | Status: AC
  Administered 2017-01-20: 10 mg via INTRAVENOUS

## 2017-01-20 MED ORDER — PALONOSETRON HCL INJECTION 0.25 MG/5ML
INTRAVENOUS | Status: AC
  Filled 2017-01-20: qty 5

## 2017-01-20 MED ORDER — DEXAMETHASONE SODIUM PHOSPHATE 10 MG/ML IJ SOLN
INTRAMUSCULAR | Status: AC
  Filled 2017-01-20: qty 1

## 2017-01-20 MED ORDER — DIPHENHYDRAMINE HCL 50 MG/ML IJ SOLN
50.0000 mg | Freq: Once | INTRAMUSCULAR | Status: AC
Start: 2017-01-20 — End: 2017-01-20
  Administered 2017-01-20: 50 mg via INTRAVENOUS

## 2017-01-20 MED ORDER — SODIUM CHLORIDE 0.9 % IV SOLN
Freq: Once | INTRAVENOUS | Status: AC
  Administered 2017-01-20: 12:00:00 via INTRAVENOUS

## 2017-01-20 MED ORDER — ACETAMINOPHEN 325 MG PO TABS
650.0000 mg | ORAL_TABLET | Freq: Once | ORAL | Status: AC
  Administered 2017-01-20: 650 mg via ORAL

## 2017-01-20 MED ORDER — METHYLPREDNISOLONE SODIUM SUCC 125 MG IJ SOLR
125.0000 mg | Freq: Once | INTRAMUSCULAR | Status: AC
  Administered 2017-01-20: 125 mg via INTRAVENOUS

## 2017-01-20 MED ORDER — PALONOSETRON HCL INJECTION 0.25 MG/5ML
0.2500 mg | Freq: Once | INTRAVENOUS | Status: AC
  Administered 2017-01-20: 0.25 mg via INTRAVENOUS

## 2017-01-20 MED ORDER — ACETAMINOPHEN 325 MG PO TABS
ORAL_TABLET | ORAL | Status: AC
  Filled 2017-01-20: qty 2

## 2017-01-20 MED ORDER — SODIUM CHLORIDE 0.9 % IV SOLN
1.8000 mg/kg | Freq: Once | INTRAVENOUS | Status: AC
  Administered 2017-01-20: 135 mg via INTRAVENOUS
  Filled 2017-01-20: qty 27

## 2017-01-20 MED ORDER — FAMOTIDINE IN NACL 20-0.9 MG/50ML-% IV SOLN
20.0000 mg | Freq: Two times a day (BID) | INTRAVENOUS | Status: DC
  Administered 2017-01-20: 20 mg via INTRAVENOUS

## 2017-01-20 NOTE — Progress Notes (Signed)
SYMPTOM MANAGEMENT CLINIC    Chief Complaint: Itching  HPI:  Heidi [name] 31 y.o. female diagnosed with lymphoma.  Currently undergoing Adcetris and bendamustine chemotherapy.    No history exists.    Review of Systems  Skin: Positive for itching.  All other systems reviewed and are negative.   Past Medical History:  Diagnosis Date  . ARDS (adult respiratory distress syndrome) ([place])   . Asthma   . Hodgkin lymphoma ([place])   . Hypertension     Past Surgical History:  Procedure Laterality Date  . AXILLARY LYMPH NODE BIOPSY Right 03/19/2016   Procedure: AXILLARY LYMPH NODE BIOPSY;  Surgeon: Armandina Gemma, MD;  Location: [facility];  Service: General;  Laterality: Right;  . IR GENERIC HISTORICAL  12/22/2016   IR FLUORO GUIDE PORT INSERTION LEFT 12/22/2016 [facility]  . IR GENERIC HISTORICAL  12/22/2016   IR US GUIDE VASC ACCESS LEFT 12/22/2016 [facility]  . IR GENERIC HISTORICAL  12/22/2016   IR CV LINE INJECTION 12/22/2016 [facility]  . IR GENERIC HISTORICAL  12/22/2016   IR REMOVAL TUN ACCESS W/ PORT W/O FL MOD SED 12/22/2016 [facility]  . 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    has No Known Allergies.  Allergies as of 01/20/2017   No Known Allergies     Medication List       Accurate as of 01/20/17  5:56 PM. Always use your most recent med list.          dexamethasone 4 MG tablet Commonly known as:  DECADRON 2 tab (34m) twice a day for 3 days starting on day of chemotherapy   guaiFENesin 100 MG/5ML Soln Commonly known as:  ROBITUSSIN Take 5 mLs (100 mg total) by mouth every 6 (six) hours as needed for cough or to loosen phlegm.   labetalol 200 MG tablet Commonly known as:  NORMODYNE Take 1 tablet (200 mg total) by mouth 3 (three) times daily.   levalbuterol 45 MCG/ACT inhaler Commonly known as:  XOPENEX HFA Inhale 2 puffs into the lungs every 6 (six) hours as needed for wheezing.   levofloxacin 750 MG tablet Commonly known as:  LEVAQUIN Take 1 tablet (750 mg total) by mouth daily.   lidocaine-prilocaine cream Commonly known as:  EMLA Apply 1 application topically as needed (numbing).   loratadine 10 MG tablet Commonly known as:  CLARITIN Take 1 tablet (10 mg total) by mouth daily. For up to 5 days following Neulasta injection.   ondansetron 8 MG tablet Commonly known as:  ZOFRAN Take 1 tablet (8 mg total) by mouth every 8 (eight) hours as needed for nausea (start on the 3rd day after chemotherapy).   prochlorperazine 10 MG tablet Commonly known as:  COMPAZINE Take 1 tablet (10 mg total) by mouth every 6 (six) hours as needed for nausea or vomiting.   Rivaroxaban 15  MG Tabs tablet Commonly known as:  XARELTO Take 1 tablet (15 mg total) by mouth 2 (two) times daily with a meal.   rivaroxaban 20 MG Tabs tablet Commonly known as:  XARELTO Take 1 tablet (20 mg total) by mouth daily with supper.        PHYSICAL EXAMINATION  Oncology Vitals 01/20/2017 01/20/2017  Height - -  Weight - -  Weight (lbs) - -  BMI (kg/m2) - -  Temp (No Data) -  Pulse 107 104  Resp 18 -  SpO2 99 98    BSA (m2) - -   BP Readings from Last 2 Encounters:  01/20/17 120/65  01/20/17 (!) 122/56    Physical Exam  Constitutional: She is oriented to person, place, and time. Vital signs are normal. She appears malnourished. She appears unhealthy. She appears cachectic.  HENT:  Head: Normocephalic and atraumatic.  Eyes: Conjunctivae and EOM are normal. Pupils are equal, round, and reactive to light.  Neck: Normal range of motion.  Pulmonary/Chest: Effort normal. No stridor. No respiratory distress.  Musculoskeletal: Normal range of motion.  Neurological: She is alert and oriented to person, place, and time.  Skin: Rash noted.  Infusion was held; and exam today revealed patient has multiple, scattered areas of old, healing lesions-some with scabs noted.  Some of these healed lesions appear to be questionable bedbugs versus picking.  No bedbugs or other mites noted on exam.    Psychiatric: Affect normal.  Nursing note and vitals reviewed.   LABORATORY DATA:. Appointment on 01/20/2017  Component Date Value Ref Range Status  . WBC 01/20/2017 10.1  3.9 - 10.3 10e3/uL Final  . NEUT# 01/20/2017 8.7* 1.5 - 6.5 10e3/uL Final  . HGB 01/20/2017 10.2* 11.6 - 15.9 g/dL Final  . HCT 01/20/2017 31.2* 34.8 - 46.6 % Final  . Platelets 01/20/2017 398  145 - 400 10e3/uL Final  . MCV 01/20/2017 88.9  79.5 - 101.0 fL Final  . MCH 01/20/2017 29.1  25.1 - 34.0 pg Final  . MCHC 01/20/2017 32.7  31.5 - 36.0 g/dL Final  . RBC 01/20/2017 3.51* 3.70 - 5.45 10e6/uL Final  . RDW 01/20/2017 16.3* 11.2 - 14.5 % Final  . lymph# 01/20/2017 0.6* 0.9 - 3.3 10e3/uL Final  . MONO# 01/20/2017 0.7  0.1 - 0.9 10e3/uL Final  . Eosinophils Absolute 01/20/2017 0.1  0.0 - 0.5 10e3/uL Final  . Basophils Absolute 01/20/2017 0.0  0.0 - 0.1 10e3/uL Final  . NEUT% 01/20/2017 86.0* 38.4 - 76.8 % Final  . LYMPH% 01/20/2017 5.6* 14.0 - 49.7 % Final  . MONO% 01/20/2017 7.4  0.0 - 14.0 % Final  . EOS% 01/20/2017 0.7  0.0 - 7.0 %  Final  . BASO% 01/20/2017 0.3  0.0 - 2.0 % Final  . Retic % 01/20/2017 2.18* 0.70 - 2.10 % Final  . Retic Ct Abs 01/20/2017 76.52  33.70 - 90.70 10e3/uL Final  . Immature Retic Fract 01/20/2017 5.30  1.60 - 10.00 % Final  . Sodium 01/20/2017 135* 136 - 145 mEq/L Final  . Potassium 01/20/2017 3.8  3.5 - 5.1 mEq/L Final  . Chloride 01/20/2017 96* 98 - 109 mEq/L Final  . CO2 01/20/2017 32* 22 - 29 mEq/L Final  . Glucose 01/20/2017 118  70 - 140 mg/dl Final  . BUN 01/20/2017 4.3* 7.0 - 26.0 mg/dL Final  . Creatinine 01/20/2017 0.6  0.6 - 1.1 mg/dL Final  . Total Bilirubin 01/20/2017 0.40  0.20 - 1.20 mg/dL Final  .  Alkaline Phosphatase 01/20/2017 106  40 - 150 U/L Final  . AST 01/20/2017 10  5 - 34 U/L Final  . ALT 01/20/2017 9  0 - 55 U/L Final  . Total Protein 01/20/2017 5.5* 6.4 - 8.3 g/dL Final  . Albumin 01/20/2017 2.0* 3.5 - 5.0 g/dL Final  . Calcium 01/20/2017 8.5  8.4 - 10.4 mg/dL Final  . Anion Gap 01/20/2017 7  3 - 11 mEq/L Final  . EGFR 01/20/2017 >90  >90 ml/min/1.73 m2 Final  . LDH 01/20/2017 336* 125 - 245 U/L Final    RADIOGRAPHIC STUDIES: No results found.  ASSESSMENT/PLAN:    Pruritus Patient had almost completed her Adcetris infusion when she developed some generalized pruritus.  Patient states that she has a history of itching and dry skin/rash on a regular basis; and was scratching at her legs.  Infusion was held; and exam today revealed patient has multiple, scattered areas of old, healing lesions-some with scabs noted.  Some of these healed lesions appear to be questionable bedbugs versus picking.  No bedbugs or other mites noted on exam.  Confirmed the patient was given Benadryl 50 mg and Decadron as a premedication today.  Patient was given Pepcid, and Solu-Medrol IV; and patient was able to complete the rest of her infusions today with no further issues.  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  She was seen prior to her infusion by Dr. Irene Limbo.  She is scheduled to return tomorrow for day 2 of cycle 2 of her chemotherapy.  She is scheduled.  Return for follow-up visit in chemotherapy again on 02/10/2017.     Patient stated understanding of all instructions; and was in agreement with this plan of care. The patient knows to call the clinic with any problems, questions or concerns.   Total time spent with patient was 15 minutes;  with greater than 75 percent of that time spent in face to face counseling regarding patient's symptoms,  and coordination of care and follow up.  Disclaimer:This dictation was prepared with Dragon/digital dictation along with [employer]. Any transcriptional errors that result from this process are unintentional.  Drue Second, NP 01/20/2017

## 2017-01-20 NOTE — Telephone Encounter (Signed)
Per 1/25 LOS and staff message I have scheduled appts and gave calendar

## 2017-01-20 NOTE — Assessment & Plan Note (Signed)
Patient  presented to the [facility] today to receive cycle 2 of her Adcetris and bendamustine regimen.  She was seen prior to her infusion by Dr. Irene Limbo.  She is scheduled to return tomorrow for day 2 of cycle 2 of her chemotherapy.  She is scheduled.  Return for follow-up visit in chemotherapy again on 02/10/2017.

## 2017-01-20 NOTE — Telephone Encounter (Signed)
Appointment scheduled per 1/25 LOS. Patient was called back to her infusion appointment during scheduling. She will pick up her print outs in infusion room.

## 2017-01-20 NOTE — Progress Notes (Signed)
Heidi [name]  HEMATOLOGY ONCOLOGY PROGRESS NOTE  Date of service: .01/20/2017   Patient Care Team: Maren Reamer, MD as PCP - General (Internal Medicine)  Chief complaint: Follow-up for Hodgkin's lymphoma  Diagnosis:   Refractory Mixed cellularity Hodgkin's lymphoma IVBE with extensive lymphadenopathy including right axillary, mediastinal and upper retroperitoneal and now biopsy proven pulmonary involvement. She was noted to have significant constitutional symptoms including significant weight loss, fevers chills and some night sweats.  Current Treatment:  on 2nd line therapy with Bendamustine + Brentuximab s/p C1 on 12/30/2016   Previous treatment 5 cycle of AVD (without bleomycin due to lung involvement and DLCO of 37%, active smoker) Multiple avoidable treatment delays due to the patient's noncompliance with follow-up for avoidable reasons. She has been counseled repeatedly that this would increase the likelihood of unfavorable outcome.  INTERVAL HISTORY:  [name] notes that she is feeling much better with resolution of her facial and upper extremity swelling. Breathing is improved but she still needing [place] O2 3-4L/min (at 100%- probably could be weaned down).  She had multiple no-shows for follow-up after her first cycle of bendamustine/brentuximab treatment for toxicity check. She also has not been able to get her PET scan scheduled. She has an appointment at [facility] for transplant evaluation on 02/15/2017. Notes no fevers no chills no night sweats. Breathing has improved. No chest pain.  REVIEW OF SYSTEMS:    10 Point review of systems of done and is negative except as noted above.  . Past Medical History:  Diagnosis Date  . ARDS (adult respiratory distress syndrome) ([place])   . Asthma   . Hodgkin lymphoma ([place])   . Hypertension     . Past Surgical History:  Procedure Laterality Date  . AXILLARY LYMPH NODE BIOPSY Right 03/19/2016   Procedure: AXILLARY LYMPH  NODE BIOPSY;  Surgeon: Armandina Gemma, MD;  Location: [facility];  Service: General;  Laterality: Right;  . IR GENERIC HISTORICAL  12/22/2016   IR FLUORO GUIDE PORT INSERTION LEFT 12/22/2016 [facility]  . IR GENERIC HISTORICAL  12/22/2016   IR US GUIDE VASC ACCESS LEFT 12/22/2016 [facility]  . IR GENERIC HISTORICAL  12/22/2016   IR CV LINE INJECTION 12/22/2016 [facility]  . IR GENERIC HISTORICAL  12/22/2016   IR REMOVAL TUN ACCESS W/ PORT W/O FL MOD SED 12/22/2016 [facility]  . VIDEO BRONCHOSCOPY Bilateral 11/26/2016   Procedure: VIDEO BRONCHOSCOPY WITH FLUORO;  Surgeon: Rigoberto Noel, MD;  Location: [facility];  Service: Cardiopulmonary;  Laterality: Bilateral;    . Social History  Substance Use Topics  . Smoking status: Former Smoker    Packs/day: 0.50    Years: 15.00    Types: Cigarettes    Quit date: 03/27/2016  . Smokeless tobacco: Never Used  . Alcohol use Yes     Comment: occasional now    ALLERGIES:  has No Known Allergies.  MEDICATIONS:  Current Outpatient Prescriptions  Medication Sig Dispense Refill  . dexamethasone (DECADRON) 4 MG tablet 2 tab (8mg ) twice a day for 3 days starting on day of chemotherapy 30 tablet 3  . guaiFENesin (ROBITUSSIN) 100 MG/5ML SOLN Take 5 mLs (100 mg total) by mouth every 6 (six) hours as needed for cough or to loosen phlegm. 1200 mL 0  . labetalol (NORMODYNE) 200 MG tablet Take 1 tablet (200 mg total) by mouth 3 (three) times daily. 90 tablet 1  . levalbuterol (XOPENEX HFA) 45 MCG/ACT inhaler Inhale 2 puffs into the lungs every 6 (  six) hours as needed for wheezing. 1 Inhaler 12  . levofloxacin (LEVAQUIN) 750 MG tablet Take 1 tablet (750 mg total) by mouth daily. 3 tablet 0  . lidocaine-prilocaine (EMLA) cream Apply 1 application topically as needed (numbing).     Heidi [name] loratadine (CLARITIN) 10 MG tablet Take 1 tablet (10 mg total) by mouth daily. For up to 5 days following Neulasta injection. 5 tablet 2  . ondansetron (ZOFRAN) 8 MG tablet  Take 1 tablet (8 mg total) by mouth every 8 (eight) hours as needed for nausea (start on the 3rd day after chemotherapy). 30 tablet 3  . prochlorperazine (COMPAZINE) 10 MG tablet Take 1 tablet (10 mg total) by mouth every 6 (six) hours as needed for nausea or vomiting. 30 tablet 3  . Rivaroxaban (XARELTO) 15 MG TABS tablet Take 1 tablet (15 mg total) by mouth 2 (two) times daily with a meal. 42 tablet 0  . rivaroxaban (XARELTO) 20 MG TABS tablet Take 1 tablet (20 mg total) by mouth daily with supper. 30 tablet 1   No current facility-administered medications for this visit.     PHYSICAL EXAMINATION: ECOG PERFORMANCE STATUS: 1 - Symptomatic but completely ambulatory  . Vitals:   01/20/17 1043  BP: 120/65  Pulse: (!) 101  Resp: 16  Temp: 98.9 F (37.2 C)    Filed Weights   01/20/17 1043  Weight: 130 lb 12.8 oz (59.3 kg)   .Body mass index is 20.49 kg/m.  OROPHARYNX: Moist mucous membranes No thrush NECK: supple, no JVD LYMPH:No palpable cervical , axillary or inguinal LNadenoapthy LUNGS:  improved air entry and with no rales and only scattered rhonci HEART: regular rate & rhythm ABDOMEN: abdomen soft, nontender, normoactive bowel sounds Extremities no edema  NEURO: Awake alert and oriented 3, moving all 4 extremities. No overt focal neurological deficit.  LABORATORY DATA:   I have reviewed the data as listed  . CBC Latest Ref Rng & Units 01/20/2017 12/30/2016 12/22/2016  WBC 3.9 - 10.3 10e3/uL 10.1 12.8(H) 12.5(H)  Hemoglobin 11.6 - 15.9 g/dL 10.2(L) 10.7(L) 10.5(L)  Hematocrit 34.8 - 46.6 % 31.2(L) 33.1(L) 33.1(L)  Platelets 145 - 400 10e3/uL 398 231 508(H)   . CBC    Component Value Date/Time   WBC 10.1 01/20/2017 1006   WBC 12.5 (H) 12/22/2016 0509   RBC 3.51 (L) 01/20/2017 1006   RBC 3.44 (L) 12/22/2016 0509   HGB 10.2 (L) 01/20/2017 1006   HCT 31.2 (L) 01/20/2017 1006   PLT 398 01/20/2017 1006   MCV 88.9 01/20/2017 1006   MCH 29.1 01/20/2017 1006   MCH  30.5 12/22/2016 0509   MCHC 32.7 01/20/2017 1006   MCHC 31.7 12/22/2016 0509   RDW 16.3 (H) 01/20/2017 1006   LYMPHSABS 0.6 (L) 01/20/2017 1006   MONOABS 0.7 01/20/2017 1006   EOSABS 0.1 01/20/2017 1006   BASOSABS 0.0 01/20/2017 1006    . CMP Latest Ref Rng & Units 01/20/2017 12/30/2016 12/21/2016  Glucose 70 - 140 mg/dl 118 101 143(H)  BUN 7.0 - 26.0 mg/dL 4.3(L) 4.6(L) 11  Creatinine 0.6 - 1.1 mg/dL 0.6 0.5(L) 0.50  Sodium 136 - 145 mEq/L 135(L) 135(L) 139  Potassium 3.5 - 5.1 mEq/L 3.8 4.2 4.1  Chloride 101 - 111 mmol/L - - 96(L)  CO2 22 - 29 mEq/L 32(H) 33(H) 36(H)  Calcium 8.4 - 10.4 mg/dL 8.5 8.4 8.2(L)  Total Protein 6.4 - 8.3 g/dL 5.5(L) 4.5(L) -  Total Bilirubin 0.20 - 1.20 mg/dL 0.40 0.51 -  Alkaline Phos 40 - 150 U/L 106 118 -  AST 5 - 34 U/L 10 10 -  ALT 0 - 55 U/L 9 13 -    RADIOGRAPHIC STUDIES: I have personally reviewed the radiological images as listed and agreed with the findings in the report. Ir Removal [employer] W/ [place] W/o [place] Mod Sed  Result Date: 12/22/2016 CLINICAL DATA:  Hodgkin's lymphoma. Malpositioned right IJ port catheter with venous occlusion. Recent placement of new left IJ port. EXAM: EXAM TUNNELED PORT CATHETER REMOVAL TECHNIQUE: The procedure, risks (including but not limited to bleeding, infection, organ damage ), benefits, and alternatives were explained to the patient. Questions regarding the procedure were encouraged and answered. The patient understands and consents to the procedure. Intravenous Fentanyl and Versed were administered as conscious sedation during continuous monitoring of the patient's level of consciousness and physiological / cardiorespiratory status by the radiology RN, with a total moderate sedation time of 40 minutes. Overlying skin prepped with chlorhexidine, draped in usual sterile fashion, infiltrated locally with 1% lidocaine. A small incision was made over the scar from previous placement. The port catheter was dissected  free from the underlying soft tissues and removed intact. Hemostasis was achieved. The port pocket was closed with deep interrupted and subcuticular continuous 3-0 Monocryl sutures, then covered with Dermabond. The patient tolerated the procedure well. COMPLICATIONS: COMPLICATIONS None immediate IMPRESSION: 1.  Technically successful tunneled Port catheter removal. Electronically Signed   By: Lucrezia Europe M.D.   On: 12/22/2016 17:06   Ir Cv Line Injection  Result Date: 12/22/2016 CLINICAL DATA:  Hodgkin's lymphoma. Right lung mass and mediastinal adenopathy. Right IJ port catheter that I placed 05/17/2016, found on radiography to be malpositioned into the right IJ vein. CT suggests right IJ and brachiocephalic vein occlusion. EXAM: PORT  CATHETER INJECTION UNDER FLUOROSCOPY TECHNIQUE: The procedure, risks (including but not limited to bleeding, infection, organ damage ), benefits, and alternatives were explained to the patient. Questions regarding the procedure were encouraged and answered. The patient understands and consents to the procedure. Survey fluoroscopic inspection reveals buckling of the intravascular portion of the port catheter into the right IJ vein, the tip retracted up to the level of the confluence of the IJ and subclavian veins. Injection demonstrates patency of the port reservoir and catheter. There is occlusion of the right IJ vein. There is occlusion of the right brachiocephalic vein. The injected contrast drains via small thyrocervical collaterals. No extravasation. IMPRESSION: 1. Malpositioned right IJ port catheter with IJ and right brachiocephalic vein occlusion. Revision is scheduled. Electronically Signed   By: Lucrezia Europe M.D.   On: 12/22/2016 17:05   Ir US Guide Vasc Access Left  Result Date: 12/22/2016 CLINICAL DATA:  Hodgkin's lymphoma. Needs durable long-term venous access for treatment regimen. Malpositioned right IJ port with occluded right IJ and brachiocephalic veins. EXAM:  TUNNELED PORT CATHETER PLACEMENT WITH ULTRASOUND AND FLUOROSCOPIC GUIDANCE FLUOROSCOPY TIME:  30 seconds, 5 mGy ANESTHESIA/SEDATION: Intravenous Fentanyl and Versed were administered as conscious sedation during continuous monitoring of the patient's level of consciousness and physiological / cardiorespiratory status by the radiology RN, with a total moderate sedation time of 40 minutes. TECHNIQUE: The procedure, risks, benefits, and alternatives were explained to the patient. Questions regarding the procedure were encouraged and answered. The patient understands and consents to the procedure. As antibiotic prophylaxis, vancomycin 1 g was ordered pre-procedure and administered intravenously within one hour of incision. Patency of the LEFT IJ vein was confirmed with ultrasound with image documentation.  An appropriate skin site was determined. Skin site was marked. Region was prepped using maximum barrier technique including cap and mask, sterile gown, sterile gloves, large sterile sheet, and Chlorhexidine as cutaneous antisepsis. The region was infiltrated locally with 1% lidocaine. Under real-time ultrasound guidance, the LEFT IJ vein was accessed with a 21 gauge micropuncture needle; the needle tip within the vein was confirmed with ultrasound image documentation. Needle was exchanged over a 018 guidewire for transitional dilator which allowed passage of the [facility] wire into the IVC. Over this, the transitional dilator was exchanged for a 5 [place] MPA catheter. A small incision was made on the LEFT anterior chest wall and a subcutaneous pocket fashioned. The power-injectable port was positioned and its catheter tunneled to the LEFT IJ dermatotomy site. The MPA catheter was exchanged over an Amplatz wire for a peel-away sheath, through which the port catheter, which had been trimmed to the appropriate length, was advanced and positioned under fluoroscopy with its tip at the cavoatrial junction. Spot chest radiograph  confirms good catheter position and no pneumothorax. The pocket was closed with deep interrupted and subcuticular continuous 3-0 Monocryl sutures. The port was flushed per protocol. The incisions were covered with Dermabond then covered with a sterile dressing. COMPLICATIONS: COMPLICATIONS None immediate IMPRESSION: Technically successful LEFT IJ power-injectable port catheter placement. Ready for routine use. Electronically Signed   By: Lucrezia Europe M.D.   On: 12/22/2016 17:03   Ir Fluoro Guide Port Insertion Left  Result Date: 12/22/2016 CLINICAL DATA:  Hodgkin's lymphoma. Needs durable long-term venous access for treatment regimen. Malpositioned right IJ port with occluded right IJ and brachiocephalic veins. EXAM: TUNNELED PORT CATHETER PLACEMENT WITH ULTRASOUND AND FLUOROSCOPIC GUIDANCE FLUOROSCOPY TIME:  30 seconds, 5 mGy ANESTHESIA/SEDATION: Intravenous Fentanyl and Versed were administered as conscious sedation during continuous monitoring of the patient's level of consciousness and physiological / cardiorespiratory status by the radiology RN, with a total moderate sedation time of 40 minutes. TECHNIQUE: The procedure, risks, benefits, and alternatives were explained to the patient. Questions regarding the procedure were encouraged and answered. The patient understands and consents to the procedure. As antibiotic prophylaxis, vancomycin 1 g was ordered pre-procedure and administered intravenously within one hour of incision. Patency of the LEFT IJ vein was confirmed with ultrasound with image documentation. An appropriate skin site was determined. Skin site was marked. Region was prepped using maximum barrier technique including cap and mask, sterile gown, sterile gloves, large sterile sheet, and Chlorhexidine as cutaneous antisepsis. The region was infiltrated locally with 1% lidocaine. Under real-time ultrasound guidance, the LEFT IJ vein was accessed with a 21 gauge micropuncture needle; the needle tip  within the vein was confirmed with ultrasound image documentation. Needle was exchanged over a 018 guidewire for transitional dilator which allowed passage of the [facility] wire into the IVC. Over this, the transitional dilator was exchanged for a 5 [place] MPA catheter. A small incision was made on the LEFT anterior chest wall and a subcutaneous pocket fashioned. The power-injectable port was positioned and its catheter tunneled to the LEFT IJ dermatotomy site. The MPA catheter was exchanged over an Amplatz wire for a peel-away sheath, through which the port catheter, which had been trimmed to the appropriate length, was advanced and positioned under fluoroscopy with its tip at the cavoatrial junction. Spot chest radiograph confirms good catheter position and no pneumothorax. The pocket was closed with deep interrupted and subcuticular continuous 3-0 Monocryl sutures. The port was flushed per protocol. The incisions were covered with  Dermabond then covered with a sterile dressing. COMPLICATIONS: COMPLICATIONS None immediate IMPRESSION: Technically successful LEFT IJ power-injectable port catheter placement. Ready for routine use. Electronically Signed   By: Lucrezia Europe M.D.   On: 12/22/2016 17:03    ASSESSMENT & PLAN:   31 year old African-American female with  #1 refractory/Progressive Mixed cellularity Hodgkin's lymphoma IV B E with extensive lymphadenopathy including right axillary, mediastinal and upper retroperitoneal and now biopsy proven pulmonary involvement. She was noted to have significant constitutional symptoms including significant weight loss, fevers chills and some night sweats. HIV negative Hepatitis C and hepatitis B serologies negative. Echo with normal ejection fraction. Patient was treated with 5 cycles of AVD (Bleomycin held due to poor DLCO 37% and ongoing smoking). Multiple avoidable treatment delays due to the patient's noncompliance with follow-up for avoidable reasons. She has been  counseled repeatedly that this would increase the likelihood of unfavorable outcome.  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 S/p 1st cycle of 2nd line treatment with Bendamustine/Brentuximab  #2 hypoxic respiratory failure with dense right lung consolidation and left upper lobe consolidation with SVC syndrome. Patient has completed palliative radiation to the right lung mass causing SVC compression and has had her port removed. Is on anticoagulation with Xarelto for possible thrombus.  #3 SVC syndrome - right facial and right upper extremity swelling -now resolved.  #4 Non compliance with clinic and treatment followup. Plan -PET/CT for re-evaluation of overall disease status -still pending due to patient scheduling issues. -no significant toxicities or acute issues with C1 of Bendamustine/Brentuximab, labs stable today. -Patient is appropriate to proceed with her second cycle of Bendamustine/Brentuximab -She was counseled on the importance of maintaining compliance with clinic follow-up. -referral to [facility] for evaluation regarding AutoHSCT candidacy to determine options for further course of action has been made and patient has an appointment on 02/15/2017 which she was implored to follow-up on.  -She was recommended to call in follow-up with the social worker for any other issues regarding transportation -continue Xarelto at this time -She was again counseled regarding absolute smoking cessation   Return to care with Dr. Irene Limbo in 3 weeks with cycle 3 day 1 with labs and PET/CT scan  I spent 35 minutes counseling the patient face to face. The total time spent in the appointment was 40 minutes and more than 50% was on counseling and direct patient cares.    Sullivan Lone MD [place] AAHIVMS [facility] [place] [facility] Hematology/Oncology Physician [facility]  (Office):       [phone] (Work cell):  [phone] (Fax):           [phone]

## 2017-01-20 NOTE — Patient Instructions (Signed)
[facility] Discharge Instructions for Patients Receiving Chemotherapy  Today you received the following chemotherapy agents Adcetris & Bendeka  To help prevent nausea and vomiting after your treatment, we encourage you to take your nausea medication as directed.  If you develop nausea and vomiting that is not controlled by your nausea medication, call the clinic.   BELOW ARE SYMPTOMS THAT SHOULD BE REPORTED IMMEDIATELY:  *FEVER GREATER THAN 100.5 F  *CHILLS WITH OR WITHOUT FEVER  NAUSEA AND VOMITING THAT IS NOT CONTROLLED WITH YOUR NAUSEA MEDICATION  *UNUSUAL SHORTNESS OF BREATH  *UNUSUAL BRUISING OR BLEEDING  TENDERNESS IN MOUTH AND THROAT WITH OR WITHOUT PRESENCE OF ULCERS  *URINARY PROBLEMS  *BOWEL PROBLEMS  UNUSUAL RASH Items with * indicate a potential emergency and should be followed up as soon as possible.  Feel free to call the clinic you have any questions or concerns. The clinic phone number is [phone].  Please show the CHEMO ALERT CARD at check-in to the Emergency Department and triage nurse.    

## 2017-01-20 NOTE — Assessment & Plan Note (Signed)
Patient had almost completed her Adcetris infusion when she developed some generalized pruritus.  Patient states that she has a history of itching and dry skin/rash on a regular basis; and was scratching at her legs.  Infusion was held; and exam today revealed patient has multiple, scattered areas of old, healing lesions-some with scabs noted.  Some of these healed lesions appear to be questionable bedbugs versus picking.  No bedbugs or other mites noted on exam.  Confirmed the patient was given Benadryl 50 mg and Decadron as a premedication today.  Patient was given Pepcid, and Solu-Medrol IV; and patient was able to complete the rest of her infusions today with no further issues.

## 2017-01-20 NOTE — Progress Notes (Addendum)
01/20/2017 Patient was receiving 2nd dose of Adcetris, @1308  patient complaining of itching that is more worse than normal.  Notified Selena Lesser, NP to chairside. Reviewed premeds that were given.  Instructed to proceed with Pepcid.  @1311  Selena Lesser, NP at chairside.  Patient appears to have numerous excoriations from previous scratching on arms and legs.  Instructed to also give SoluMedrol 125 mg after Pepcid is given.  She will discuss with Dr. Irene Limbo and then if that improves will proceed with remaining of drug after additional premeds were given.  Remaining amount of Adcetris is approximately 49ml and then will get Bendeka.  @1342  patient states itching has decreased some and more back to normal state.  Per Selena Lesser, NP ok to proceed with remaining of Adcetris and to proceed with Bendeka.

## 2017-01-21 ENCOUNTER — Ambulatory Visit: Payer: Medicaid Other

## 2017-01-21 LAB — SEDIMENTATION RATE: Sedimentation Rate-Westergren: 12 mm/hr (ref 0–32)

## 2017-02-10 ENCOUNTER — Other Ambulatory Visit (HOSPITAL_BASED_OUTPATIENT_CLINIC_OR_DEPARTMENT_OTHER): Payer: Medicaid Other

## 2017-02-10 ENCOUNTER — Ambulatory Visit (HOSPITAL_BASED_OUTPATIENT_CLINIC_OR_DEPARTMENT_OTHER): Payer: Medicaid Other

## 2017-02-10 ENCOUNTER — Ambulatory Visit (HOSPITAL_BASED_OUTPATIENT_CLINIC_OR_DEPARTMENT_OTHER): Payer: Medicaid Other | Admitting: Hematology

## 2017-02-10 ENCOUNTER — Other Ambulatory Visit: Payer: Self-pay | Admitting: *Deleted

## 2017-02-10 ENCOUNTER — Telehealth: Payer: Self-pay | Admitting: Hematology

## 2017-02-10 ENCOUNTER — Encounter: Payer: Self-pay | Admitting: Hematology

## 2017-02-10 VITALS — BP 119/64 | HR 109 | Temp 98.9°F | Resp 20 | Wt 126.8 lb

## 2017-02-10 DIAGNOSIS — I871 Compression of vein: Secondary | ICD-10-CM

## 2017-02-10 DIAGNOSIS — C8128 Mixed cellularity classical Hodgkin lymphoma, lymph nodes of multiple sites: Secondary | ICD-10-CM

## 2017-02-10 DIAGNOSIS — Z5112 Encounter for antineoplastic immunotherapy: Secondary | ICD-10-CM | POA: Diagnosis present

## 2017-02-10 DIAGNOSIS — Z5111 Encounter for antineoplastic chemotherapy: Secondary | ICD-10-CM

## 2017-02-10 DIAGNOSIS — Z7901 Long term (current) use of anticoagulants: Secondary | ICD-10-CM

## 2017-02-10 LAB — COMPREHENSIVE METABOLIC PANEL
ALT: 7 U/L (ref 0–55)
AST: 15 U/L (ref 5–34)
Albumin: 2.7 g/dL — ABNORMAL LOW (ref 3.5–5.0)
Alkaline Phosphatase: 89 U/L (ref 40–150)
Anion Gap: 8 mEq/L (ref 3–11)
BUN: <4 mg/dL — ABNORMAL LOW (ref 7.0–26.0)
CHLORIDE: 100 meq/L (ref 98–109)
CO2: 29 mEq/L (ref 22–29)
Calcium: 9.2 mg/dL (ref 8.4–10.4)
Creatinine: 0.6 mg/dL (ref 0.6–1.1)
EGFR: >90 mL/min/{1.73_m2} (ref >90)
GLUCOSE: 92 mg/dL (ref 70–140)
POTASSIUM: 3.8 meq/L (ref 3.5–5.1)
SODIUM: 137 meq/L (ref 136–145)
Total Bilirubin: 0.38 mg/dL (ref 0.20–1.20)
Total Protein: 7.1 g/dL (ref 6.4–8.3)

## 2017-02-10 LAB — CBC & DIFF AND RETIC
BASO%: 0.2 % (ref 0.0–2.0)
Basophils Absolute: 0 10*3/uL (ref 0.0–0.1)
EOS%: 4.8 % (ref 0.0–7.0)
Eosinophils Absolute: 0.3 10*3/uL (ref 0.0–0.5)
HCT: 33 % — ABNORMAL LOW (ref 34.8–46.6)
HEMOGLOBIN: 10.7 g/dL — AB (ref 11.6–15.9)
Immature Retic Fract: 4.1 % (ref 1.60–10.00)
LYMPH#: 0.4 10*3/uL — AB (ref 0.9–3.3)
LYMPH%: 6.2 % — ABNORMAL LOW (ref 14.0–49.7)
MCH: 29 pg (ref 25.1–34.0)
MCHC: 32.4 g/dL (ref 31.5–36.0)
MCV: 89.4 fL (ref 79.5–101.0)
MONO#: 0.5 10*3/uL (ref 0.1–0.9)
MONO%: 8.4 % (ref 0.0–14.0)
NEUT%: 80.4 % — ABNORMAL HIGH (ref 38.4–76.8)
NEUTROS ABS: 4.9 10*3/uL (ref 1.5–6.5)
Platelets: 392 10*3/uL (ref 145–400)
RBC: 3.69 10*6/uL — ABNORMAL LOW (ref 3.70–5.45)
RDW: 17 % — AB (ref 11.2–14.5)
RETIC %: 2.21 % — AB (ref 0.70–2.10)
Retic Ct Abs: 81.55 10*3/uL (ref 33.70–90.70)
WBC: 6.1 10*3/uL (ref 3.9–10.3)

## 2017-02-10 LAB — TECHNOLOGIST REVIEW

## 2017-02-10 MED ORDER — SODIUM CHLORIDE 0.9 % IV SOLN
Freq: Once | INTRAVENOUS | Status: AC
  Administered 2017-02-10: 13:00:00 via INTRAVENOUS

## 2017-02-10 MED ORDER — SODIUM CHLORIDE 0.9 % IV SOLN
1.8000 mg/kg | Freq: Once | INTRAVENOUS | Status: AC
  Administered 2017-02-10: 135 mg via INTRAVENOUS
  Filled 2017-02-10: qty 27

## 2017-02-10 MED ORDER — PALONOSETRON HCL INJECTION 0.25 MG/5ML
0.2500 mg | Freq: Once | INTRAVENOUS | Status: AC
  Administered 2017-02-10: 0.25 mg via INTRAVENOUS

## 2017-02-10 MED ORDER — DIPHENHYDRAMINE HCL 50 MG/ML IJ SOLN
INTRAMUSCULAR | Status: AC
  Filled 2017-02-10: qty 1

## 2017-02-10 MED ORDER — DEXAMETHASONE SODIUM PHOSPHATE 10 MG/ML IJ SOLN
10.0000 mg | Freq: Once | INTRAMUSCULAR | Status: AC
  Administered 2017-02-10: 10 mg via INTRAVENOUS

## 2017-02-10 MED ORDER — SODIUM CHLORIDE 0.9% FLUSH
10.0000 mL | INTRAVENOUS | Status: DC | PRN
  Administered 2017-02-10: 10 mL
  Filled 2017-02-10: qty 10

## 2017-02-10 MED ORDER — ONDANSETRON HCL 8 MG PO TABS: 8.0000 mg | ORAL_TABLET | Freq: Three times a day (TID) | ORAL | 3 refills | Status: DC | PRN

## 2017-02-10 MED ORDER — PALONOSETRON HCL INJECTION 0.25 MG/5ML
INTRAVENOUS | Status: AC
  Filled 2017-02-10: qty 5

## 2017-02-10 MED ORDER — ACETAMINOPHEN 325 MG PO TABS
650.0000 mg | ORAL_TABLET | Freq: Once | ORAL | Status: AC
  Administered 2017-02-10: 650 mg via ORAL

## 2017-02-10 MED ORDER — DEXAMETHASONE SODIUM PHOSPHATE 10 MG/ML IJ SOLN
INTRAMUSCULAR | Status: AC
  Filled 2017-02-10: qty 1

## 2017-02-10 MED ORDER — HEPARIN SOD (PORK) LOCK FLUSH 100 UNIT/ML IV SOLN
500.0000 [IU] | Freq: Once | INTRAVENOUS | Status: AC | PRN
  Administered 2017-02-10: 500 [IU]
  Filled 2017-02-10: qty 5

## 2017-02-10 MED ORDER — SODIUM CHLORIDE 0.9 % IV SOLN
90.0000 mg/m2 | Freq: Once | INTRAVENOUS | Status: AC
  Administered 2017-02-10: 175 mg via INTRAVENOUS
  Filled 2017-02-10: qty 7

## 2017-02-10 MED ORDER — DEXAMETHASONE 4 MG PO TABS: ORAL_TABLET | ORAL | 3 refills | Status: DC

## 2017-02-10 MED ORDER — ACETAMINOPHEN 325 MG PO TABS
ORAL_TABLET | ORAL | Status: AC
  Filled 2017-02-10: qty 2

## 2017-02-10 MED ORDER — DIPHENHYDRAMINE HCL 50 MG/ML IJ SOLN
50.0000 mg | Freq: Once | INTRAMUSCULAR | Status: AC
  Administered 2017-02-10: 50 mg via INTRAVENOUS

## 2017-02-10 NOTE — Patient Instructions (Signed)
[facility] Discharge Instructions for Patients Receiving Chemotherapy  Today you received the following chemotherapy agents Adcetris & Bendeka  To help prevent nausea and vomiting after your treatment, we encourage you to take your nausea medication as directed.  If you develop nausea and vomiting that is not controlled by your nausea medication, call the clinic.   BELOW ARE SYMPTOMS THAT SHOULD BE REPORTED IMMEDIATELY:  *FEVER GREATER THAN 100.5 F  *CHILLS WITH OR WITHOUT FEVER  NAUSEA AND VOMITING THAT IS NOT CONTROLLED WITH YOUR NAUSEA MEDICATION  *UNUSUAL SHORTNESS OF BREATH  *UNUSUAL BRUISING OR BLEEDING  TENDERNESS IN MOUTH AND THROAT WITH OR WITHOUT PRESENCE OF ULCERS  *URINARY PROBLEMS  *BOWEL PROBLEMS  UNUSUAL RASH Items with * indicate a potential emergency and should be followed up as soon as possible.  Feel free to call the clinic you have any questions or concerns. The clinic phone number is [phone].  Please show the CHEMO ALERT CARD at check-in to the Emergency Department and triage nurse.    

## 2017-02-10 NOTE — Telephone Encounter (Signed)
Appointments scheduled per 2/15 LOS. Patient given AVS report and calendars with future scheduled appointments. °

## 2017-02-11 ENCOUNTER — Ambulatory Visit (HOSPITAL_BASED_OUTPATIENT_CLINIC_OR_DEPARTMENT_OTHER): Payer: Medicaid Other

## 2017-02-11 ENCOUNTER — Telehealth: Payer: Self-pay | Admitting: *Deleted

## 2017-02-11 VITALS — BP 117/70 | HR 91 | Temp 98.1°F | Resp 16

## 2017-02-11 DIAGNOSIS — Z5111 Encounter for antineoplastic chemotherapy: Secondary | ICD-10-CM | POA: Diagnosis present

## 2017-02-11 DIAGNOSIS — I871 Compression of vein: Secondary | ICD-10-CM

## 2017-02-11 DIAGNOSIS — C8128 Mixed cellularity classical Hodgkin lymphoma, lymph nodes of multiple sites: Secondary | ICD-10-CM | POA: Diagnosis not present

## 2017-02-11 LAB — SEDIMENTATION RATE: Sedimentation Rate-Westergren: 61 mm/hr — ABNORMAL HIGH (ref 0–32)

## 2017-02-11 MED ORDER — ACETAMINOPHEN 325 MG PO TABS
ORAL_TABLET | ORAL | Status: AC
  Filled 2017-02-11: qty 2

## 2017-02-11 MED ORDER — ACETAMINOPHEN 325 MG PO TABS
650.0000 mg | ORAL_TABLET | Freq: Once | ORAL | Status: AC
Start: 2017-02-11 — End: 2017-02-11
  Administered 2017-02-11: 650 mg via ORAL

## 2017-02-11 MED ORDER — HEPARIN SOD (PORK) LOCK FLUSH 100 UNIT/ML IV SOLN
500.0000 [IU] | Freq: Once | INTRAVENOUS | Status: AC | PRN
  Administered 2017-02-11: 500 [IU]
  Filled 2017-02-11: qty 5

## 2017-02-11 MED ORDER — DIPHENHYDRAMINE HCL 50 MG/ML IJ SOLN
INTRAMUSCULAR | Status: AC
  Filled 2017-02-11: qty 1

## 2017-02-11 MED ORDER — SODIUM CHLORIDE 0.9% FLUSH
10.0000 mL | INTRAVENOUS | Status: DC | PRN
  Administered 2017-02-11: 10 mL
  Filled 2017-02-11: qty 10

## 2017-02-11 MED ORDER — DIPHENHYDRAMINE HCL 50 MG/ML IJ SOLN
50.0000 mg | Freq: Once | INTRAMUSCULAR | Status: AC
  Administered 2017-02-11: 50 mg via INTRAVENOUS

## 2017-02-11 MED ORDER — DEXAMETHASONE SODIUM PHOSPHATE 10 MG/ML IJ SOLN
10.0000 mg | Freq: Once | INTRAMUSCULAR | Status: AC
  Administered 2017-02-11: 10 mg via INTRAVENOUS

## 2017-02-11 MED ORDER — SODIUM CHLORIDE 0.9 % IV SOLN
Freq: Once | INTRAVENOUS | Status: AC
  Administered 2017-02-11: 10:00:00 via INTRAVENOUS

## 2017-02-11 MED ORDER — DEXAMETHASONE SODIUM PHOSPHATE 10 MG/ML IJ SOLN
INTRAMUSCULAR | Status: AC
  Filled 2017-02-11: qty 1

## 2017-02-11 MED ORDER — BENDAMUSTINE HCL CHEMO INJECTION 100 MG/4ML
90.0000 mg/m2 | Freq: Once | INTRAVENOUS | Status: AC
  Administered 2017-02-11: 175 mg via INTRAVENOUS
  Filled 2017-02-11: qty 7

## 2017-02-11 NOTE — Patient Instructions (Signed)
[facility] Discharge Instructions for Patients Receiving Chemotherapy  Today you received the following chemotherapy agents Bendeka.   To help prevent nausea and vomiting after your treatment, we encourage you to take your nausea medication as directed.    If you develop nausea and vomiting that is not controlled by your nausea medication, call the clinic.   BELOW ARE SYMPTOMS THAT SHOULD BE REPORTED IMMEDIATELY:  *FEVER GREATER THAN 100.5 F  *CHILLS WITH OR WITHOUT FEVER  NAUSEA AND VOMITING THAT IS NOT CONTROLLED WITH YOUR NAUSEA MEDICATION  *UNUSUAL SHORTNESS OF BREATH  *UNUSUAL BRUISING OR BLEEDING  TENDERNESS IN MOUTH AND THROAT WITH OR WITHOUT PRESENCE OF ULCERS  *URINARY PROBLEMS  *BOWEL PROBLEMS  UNUSUAL RASH Items with * indicate a potential emergency and should be followed up as soon as possible.  Feel free to call the clinic you have any questions or concerns. The clinic phone number is [phone].  Please show the CHEMO ALERT CARD at check-in to the Emergency Department and triage nurse.   

## 2017-02-11 NOTE — Telephone Encounter (Signed)
Per 2/15 LOS and staff message I have scheduled appts. Notified the scheduelr

## 2017-02-13 NOTE — Progress Notes (Signed)
[name] Kitchen  HEMATOLOGY ONCOLOGY PROGRESS NOTE  Date of service: .02/10/2017   Patient Care Team: Maren Reamer, MD as PCP - General (Internal Medicine)  Chief complaint: Follow-up for Hodgkin's lymphoma  Diagnosis:   Refractory Mixed cellularity Hodgkin's lymphoma IVBE with extensive lymphadenopathy including right axillary, mediastinal and upper retroperitoneal and now biopsy proven pulmonary involvement. She was noted to have significant constitutional symptoms including significant weight loss, fevers chills and some night sweats.  Current Treatment:  on 2nd line therapy with Bendamustine + Brentuximab s/p C2 on 12/30/2016   Previous treatment 5 cycles of AVD (without bleomycin due to lung involvement and DLCO of 37%, active smoker) Multiple avoidable treatment delays due to the patient's noncompliance with follow-up for avoidable reasons. She has been counseled repeatedly that this would increase the likelihood of unfavorable outcome.  INTERVAL HISTORY:  Ms Yetman is here for f/u prior to her 3rd cycle of Bendamustine/Brentuximab. She did not followup for her D2 Bendamustine with Cycle 2. She arrives about 1.5h late for her clinic and chemotherapy appointment. We have a detailed discussion about how her non compliance is jeopardizing her treatment outcomes. Her breathing has continued to improve and she is now off oxygen. Still has DOE. She has an appointment at [facility] for transplant evaluation on 02/15/2017. Notes no fevers no chills no night sweats. Breathing has improved. No chest pain.  REVIEW OF SYSTEMS:    10 Point review of systems of done and is negative except as noted above.  . Past Medical History:  Diagnosis Date  . ARDS (adult respiratory distress syndrome) ([place])   . Asthma   . Hodgkin lymphoma ([place])   . Hypertension     . Past Surgical History:  Procedure Laterality Date  . AXILLARY LYMPH NODE BIOPSY Right 03/19/2016   Procedure: AXILLARY LYMPH  NODE BIOPSY;  Surgeon: Armandina Gemma, MD;  [facility];  Service: General;  Laterality: Right;  . IR GENERIC HISTORICAL  12/22/2016   IR FLUORO GUIDE PORT INSERTION LEFT 12/22/2016 [facility]  . IR GENERIC HISTORICAL  12/22/2016   IR US GUIDE VASC ACCESS LEFT 12/22/2016 [facility]  . IR GENERIC HISTORICAL  12/22/2016   IR CV LINE INJECTION 12/22/2016 [facility]  . IR GENERIC HISTORICAL  12/22/2016   IR REMOVAL TUN ACCESS W/ PORT W/O FL MOD SED 12/22/2016 [facility]  . VIDEO BRONCHOSCOPY Bilateral 11/26/2016   Procedure: VIDEO BRONCHOSCOPY WITH FLUORO;  Surgeon: Rigoberto Noel, MD;  [facility];  Service: Cardiopulmonary;  Laterality: Bilateral;    . Social History  Substance Use Topics  . Smoking status: Former Smoker    Packs/day: 0.50    Years: 15.00    Types: Cigarettes    Quit date: 03/27/2016  . Smokeless tobacco: Never Used  . Alcohol use Yes     Comment: occasional now    ALLERGIES:  has No Known Allergies.  MEDICATIONS:  Current Outpatient Prescriptions  Medication Sig Dispense Refill  . guaiFENesin (ROBITUSSIN) 100 MG/5ML SOLN Take 5 mLs (100 mg total) by mouth every 6 (six) hours as needed for cough or to loosen phlegm. 1200 mL 0  . labetalol (NORMODYNE) 200 MG tablet Take 1 tablet (200 mg total) by mouth 3 (three) times daily. 90 tablet 1  . levalbuterol (XOPENEX HFA) 45 MCG/ACT inhaler Inhale 2 puffs into the lungs every 6 (six) hours as needed for wheezing. 1 Inhaler 12  . levofloxacin (LEVAQUIN) 750 MG tablet Take 1 tablet (750 mg total) by  mouth daily. 3 tablet 0  . lidocaine-prilocaine (EMLA) cream Apply 1 application topically as needed (numbing).     [name] Kitchen loratadine (CLARITIN) 10 MG tablet Take 1 tablet (10 mg total) by mouth daily. For up to 5 days following Neulasta injection. 5 tablet 2  . prochlorperazine (COMPAZINE) 10 MG tablet Take 1 tablet (10 mg total) by mouth every 6 (six) hours as needed for nausea or vomiting. 30 tablet 3  .  rivaroxaban (XARELTO) 20 MG TABS tablet Take 1 tablet (20 mg total) by mouth daily with supper. 30 tablet 1  . dexamethasone (DECADRON) 4 MG tablet 2 tab (8mg ) twice a day for 3 days starting on day of chemotherapy 30 tablet 3  . ondansetron (ZOFRAN) 8 MG tablet Take 1 tablet (8 mg total) by mouth every 8 (eight) hours as needed for nausea (start on the 3rd day after chemotherapy). 30 tablet 3  . Rivaroxaban (XARELTO) 15 MG TABS tablet Take 1 tablet (15 mg total) by mouth 2 (two) times daily with a meal. 42 tablet 0   No current facility-administered medications for this visit.     PHYSICAL EXAMINATION: ECOG PERFORMANCE STATUS: 1 - Symptomatic but completely ambulatory  . Vitals:   02/10/17 1100  BP: 119/64  Pulse: (!) 109  Resp: 20  Temp: 98.9 F (37.2 C)    Filed Weights   02/10/17 1100  Weight: 126 lb 12.8 oz (57.5 kg)   .Body mass index is 19.86 kg/m.  OROPHARYNX: Moist mucous membranes No thrush NECK: supple, no JVD LYMPH:No palpable cervical , axillary or inguinal LNadenoapthy LUNGS:  improved air entry , b/l scattered rales HEART: regular rate & rhythm ABDOMEN: abdomen soft, nontender, normoactive bowel sounds Extremities no edema  NEURO: Awake alert and oriented 3, moving all 4 extremities. No overt focal neurological deficit.  LABORATORY DATA:   I have reviewed the data as listed  . CBC Latest Ref Rng & Units 02/10/2017 01/20/2017 12/30/2016  WBC 3.9 - 10.3 10e3/uL 6.1 10.1 12.8(H)  Hemoglobin 11.6 - 15.9 g/dL 10.7(L) 10.2(L) 10.7(L)  Hematocrit 34.8 - 46.6 % 33.0(L) 31.2(L) 33.1(L)  Platelets 145 - 400 10e3/uL 392 398 231   . CBC    Component Value Date/Time   WBC 6.1 02/10/2017 1106   WBC 12.5 (H) 12/22/2016 0509   RBC 3.69 (L) 02/10/2017 1106   RBC 3.44 (L) 12/22/2016 0509   HGB 10.7 (L) 02/10/2017 1106   HCT 33.0 (L) 02/10/2017 1106   PLT 392 02/10/2017 1106   MCV 89.4 02/10/2017 1106   MCH 29.0 02/10/2017 1106   MCH 30.5 12/22/2016 0509    MCHC 32.4 02/10/2017 1106   MCHC 31.7 12/22/2016 0509   RDW 17.0 (H) 02/10/2017 1106   LYMPHSABS 0.4 (L) 02/10/2017 1106   MONOABS 0.5 02/10/2017 1106   EOSABS 0.3 02/10/2017 1106   BASOSABS 0.0 02/10/2017 1106    . CMP Latest Ref Rng & Units 02/10/2017 01/20/2017 12/30/2016  Glucose 70 - 140 mg/dl 92 118 101  BUN 7.0 - 26.0 mg/dL <4.0(L) 4.3(L) 4.6(L)  Creatinine 0.6 - 1.1 mg/dL 0.6 0.6 0.5(L)  Sodium 136 - 145 mEq/L 137 135(L) 135(L)  Potassium 3.5 - 5.1 mEq/L 3.8 3.8 4.2  Chloride 101 - 111 mmol/L - - -  CO2 22 - 29 mEq/L 29 32(H) 33(H)  Calcium 8.4 - 10.4 mg/dL 9.2 8.5 8.4  Total Protein 6.4 - 8.3 g/dL 7.1 5.5(L) 4.5(L)  Total Bilirubin 0.20 - 1.20 mg/dL 0.38 0.40 0.51  Alkaline Phos 40 -  150 U/L 89 106 118  AST 5 - 34 U/L 15 10 10   ALT 0 - 55 U/L 7 9 13     RADIOGRAPHIC STUDIES: I have personally reviewed the radiological images as listed and agreed with the findings in the report. No results found.  ASSESSMENT & PLAN:   31 year old African-American female with  #1 refractory/Progressive Mixed cellularity Hodgkin's lymphoma IV B E with extensive lymphadenopathy including right axillary, mediastinal and upper retroperitoneal and now biopsy proven pulmonary involvement. She was noted to have significant constitutional symptoms including significant weight loss, fevers chills and some night sweats. HIV negative Hepatitis C and hepatitis B serologies negative. Echo with normal ejection fraction. Patient was treated with 5 cycles of AVD (Bleomycin held due to poor DLCO 37% and ongoing smoking). Multiple avoidable treatment delays due to the patient's noncompliance with follow-up for avoidable reasons. She has been counseled repeatedly that this would increase the likelihood of unfavorable outcome.  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 S/p 2nd cycle of 2nd line treatment with Bendamustine/Brentuximab  #2 hypoxic respiratory failure with dense  right lung consolidation and left upper lobe consolidation with SVC syndrome. Patient has completed palliative radiation to the right lung mass causing SVC compression and has had her port removed. Is on anticoagulation with Xarelto for possible thrombus. Breathing has improved and patient is now off oxygen.  #3 SVC syndrome - right facial and right upper extremity swelling -now resolved.  #4 Non compliance with clinic and treatment followup. Missed 2nd dose of bendamustine with C2. Plan -lab stable. No prohibitive toxicities. -appropriate to proceed with 3rd cycle of  Bendamustine/Brentuximab -She was counseled on the importance of maintaining compliance with clinic follow-up and treatment. -referral to [facility] for evaluation regarding AutoHSCT candidacy to determine options for further course of action has been made and patient has an appointment on 02/15/2017 which she was implored to follow-up on.  -PET/CT to evaluate treatment response. -continue Xarelto at this time -She was again counseled regarding absolute smoking cessation   Continue Bendamustine/Brentuximab as per schedule PET/CT in 2-3 weeks (prior to next treatment) RTC with Dr Irene Limbo in 3 with C4D1 of treatment.  Return to care with Dr. Irene Limbo in 3 weeks with cycle 4 day 1 with labs and PET/CT scan  I spent 20 minutes counseling the patient face to face. The total time spent in the appointment was 25 minutes and more than 50% was on counseling and direct patient cares.    Sullivan Lone MD [place] AAHIVMS [facility] Hematology/Oncology Physician [facility]  (Office):       [phone] (Work cell):  [phone] (Fax):           [phone]

## 2017-02-15 ENCOUNTER — Ambulatory Visit
Admit: 2017-02-15 | Discharge: 2017-02-15 | Disposition: A | Discharge status: Home / Self Care | Payer: Medicaid Other | Attending: Radiation Oncology | Admitting: Radiation Oncology

## 2017-03-03 ENCOUNTER — Encounter: Payer: Self-pay | Admitting: Hematology

## 2017-03-03 ENCOUNTER — Other Ambulatory Visit (HOSPITAL_BASED_OUTPATIENT_CLINIC_OR_DEPARTMENT_OTHER): Payer: Medicaid Other

## 2017-03-03 ENCOUNTER — Ambulatory Visit (HOSPITAL_BASED_OUTPATIENT_CLINIC_OR_DEPARTMENT_OTHER): Payer: Medicaid Other | Admitting: Hematology

## 2017-03-03 ENCOUNTER — Ambulatory Visit: Payer: Medicaid Other

## 2017-03-03 ENCOUNTER — Ambulatory Visit (HOSPITAL_BASED_OUTPATIENT_CLINIC_OR_DEPARTMENT_OTHER): Payer: Medicaid Other

## 2017-03-03 VITALS — BP 125/84 | HR 62 | Temp 98.2°F | Resp 18 | Ht 67.0 in | Wt 129.2 lb

## 2017-03-03 DIAGNOSIS — C8128 Mixed cellularity classical Hodgkin lymphoma, lymph nodes of multiple sites: Secondary | ICD-10-CM

## 2017-03-03 DIAGNOSIS — I871 Compression of vein: Secondary | ICD-10-CM

## 2017-03-03 DIAGNOSIS — Z5112 Encounter for antineoplastic immunotherapy: Secondary | ICD-10-CM | POA: Diagnosis not present

## 2017-03-03 DIAGNOSIS — G620 Drug-induced polyneuropathy: Secondary | ICD-10-CM | POA: Diagnosis not present

## 2017-03-03 DIAGNOSIS — Z5111 Encounter for antineoplastic chemotherapy: Secondary | ICD-10-CM

## 2017-03-03 LAB — CBC & DIFF AND RETIC
BASO%: 0.1 % (ref 0.0–2.0)
Basophils Absolute: 0 10*3/uL (ref 0.0–0.1)
EOS%: 3.1 % (ref 0.0–7.0)
Eosinophils Absolute: 0.2 10*3/uL (ref 0.0–0.5)
HEMATOCRIT: 35.4 % (ref 34.8–46.6)
HEMOGLOBIN: 11.4 g/dL — AB (ref 11.6–15.9)
Immature Retic Fract: 13.6 % — ABNORMAL HIGH (ref 1.60–10.00)
LYMPH%: 4.9 % — AB (ref 14.0–49.7)
MCH: 28.4 pg (ref 25.1–34.0)
MCHC: 32.2 g/dL (ref 31.5–36.0)
MCV: 88.1 fL (ref 79.5–101.0)
MONO#: 0.5 10*3/uL (ref 0.1–0.9)
MONO%: 6.7 % (ref 0.0–14.0)
NEUT%: 85.2 % — ABNORMAL HIGH (ref 38.4–76.8)
NEUTROS ABS: 5.7 10*3/uL (ref 1.5–6.5)
Platelets: 409 10*3/uL — ABNORMAL HIGH (ref 145–400)
RBC: 4.02 10*6/uL (ref 3.70–5.45)
RDW: 16.1 % — AB (ref 11.2–14.5)
RETIC %: 2.55 % — AB (ref 0.70–2.10)
Retic Ct Abs: 102.51 10*3/uL — ABNORMAL HIGH (ref 33.70–90.70)
WBC: 6.7 10*3/uL (ref 3.9–10.3)
lymph#: 0.3 10*3/uL — ABNORMAL LOW (ref 0.9–3.3)

## 2017-03-03 LAB — COMPREHENSIVE METABOLIC PANEL
ALBUMIN: 3 g/dL — AB (ref 3.5–5.0)
ALK PHOS: 100 U/L (ref 40–150)
ALT: <6 U/L (ref 0–55)
AST: 17 U/L (ref 5–34)
Anion Gap: 11 mEq/L (ref 3–11)
BUN: 4.3 mg/dL — AB (ref 7.0–26.0)
CALCIUM: 9.7 mg/dL (ref 8.4–10.4)
CHLORIDE: 98 meq/L (ref 98–109)
CO2: 29 mEq/L (ref 22–29)
Creatinine: 0.6 mg/dL (ref 0.6–1.1)
Glucose: 96 mg/dl (ref 70–140)
POTASSIUM: 4 meq/L (ref 3.5–5.1)
SODIUM: 138 meq/L (ref 136–145)
Total Bilirubin: 0.47 mg/dL (ref 0.20–1.20)
Total Protein: 7.5 g/dL (ref 6.4–8.3)

## 2017-03-03 MED ORDER — ACETAMINOPHEN 325 MG PO TABS
ORAL_TABLET | ORAL | Status: AC
  Filled 2017-03-03: qty 2

## 2017-03-03 MED ORDER — SODIUM CHLORIDE 0.9 % IV SOLN
1.8000 mg/kg | Freq: Once | INTRAVENOUS | Status: AC
  Administered 2017-03-03: 135 mg via INTRAVENOUS
  Filled 2017-03-03: qty 27

## 2017-03-03 MED ORDER — PALONOSETRON HCL INJECTION 0.25 MG/5ML
0.2500 mg | Freq: Once | INTRAVENOUS | Status: AC
  Administered 2017-03-03: 0.25 mg via INTRAVENOUS

## 2017-03-03 MED ORDER — SODIUM CHLORIDE 0.9 % IV SOLN
90.0000 mg/m2 | Freq: Once | INTRAVENOUS | Status: AC
  Administered 2017-03-03: 170 mg via INTRAVENOUS
  Filled 2017-03-03: qty 20

## 2017-03-03 MED ORDER — DEXAMETHASONE SODIUM PHOSPHATE 10 MG/ML IJ SOLN
10.0000 mg | Freq: Once | INTRAMUSCULAR | Status: AC
  Administered 2017-03-03: 10 mg via INTRAVENOUS

## 2017-03-03 MED ORDER — PALONOSETRON HCL INJECTION 0.25 MG/5ML
INTRAVENOUS | Status: AC
  Filled 2017-03-03: qty 5

## 2017-03-03 MED ORDER — DIPHENHYDRAMINE HCL 50 MG/ML IJ SOLN
50.0000 mg | Freq: Once | INTRAMUSCULAR | Status: AC
  Administered 2017-03-03: 50 mg via INTRAVENOUS

## 2017-03-03 MED ORDER — HEPARIN SOD (PORK) LOCK FLUSH 100 UNIT/ML IV SOLN
500.0000 [IU] | Freq: Once | INTRAVENOUS | Status: AC | PRN
  Administered 2017-03-03: 500 [IU]
  Filled 2017-03-03: qty 5

## 2017-03-03 MED ORDER — DEXAMETHASONE SODIUM PHOSPHATE 10 MG/ML IJ SOLN
INTRAMUSCULAR | Status: AC
  Filled 2017-03-03: qty 1

## 2017-03-03 MED ORDER — DIPHENHYDRAMINE HCL 50 MG/ML IJ SOLN
INTRAMUSCULAR | Status: AC
  Filled 2017-03-03: qty 1

## 2017-03-03 MED ORDER — ACETAMINOPHEN 325 MG PO TABS
650.0000 mg | ORAL_TABLET | Freq: Once | ORAL | Status: AC
  Administered 2017-03-03: 650 mg via ORAL

## 2017-03-03 MED ORDER — SODIUM CHLORIDE 0.9% FLUSH
10.0000 mL | INTRAVENOUS | Status: DC | PRN
  Administered 2017-03-03: 10 mL
  Filled 2017-03-03: qty 10

## 2017-03-03 MED ORDER — SODIUM CHLORIDE 0.9 % IV SOLN
Freq: Once | INTRAVENOUS | Status: AC
  Administered 2017-03-03: 14:00:00 via INTRAVENOUS

## 2017-03-03 NOTE — Progress Notes (Signed)
Heidi [name]  HEMATOLOGY ONCOLOGY PROGRESS NOTE  Date of service: .03/03/2017   Patient Care Team: Maren Reamer, MD as PCP - General (Internal Medicine)  Chief complaint: Follow-up for Hodgkin's lymphoma  Diagnosis:   Refractory Mixed cellularity Hodgkin's lymphoma IVBE with extensive lymphadenopathy including right axillary, mediastinal and upper retroperitoneal and now biopsy proven pulmonary involvement. She was noted to have significant constitutional symptoms including significant weight loss, fevers chills and some night sweats.  Current Treatment:  on 2nd line therapy with Bendamustine + Brentuximab s/p C3 on 12/30/2016   Previous treatment 5 cycles of AVD (without bleomycin due to lung involvement and DLCO of 37%, active smoker) Multiple avoidable treatment delays due to the patient's noncompliance with follow-up for avoidable reasons. She has been counseled repeatedly that this would increase the likelihood of unfavorable outcome.  INTERVAL HISTORY:  [name] is here for f/u prior to her 4th cycle of Bendamustine/Brentuximab. She did not followup for her appointment at [facility] for transplant evaluation on 02/15/2017 with Dr Dellis Filbert -gives multiple excuses as usual. She was hold that it is her sole responsibility to call [facility] and set a new clinic appointment. She also missed her PET/CT appointment and this had to be re-scheduled. She tends to be difficult to contact to schedule tests. Patient is aware that missed appointments and delayed workup are adversely affecting her treatment outcomes. Notes no fevers no chills no night sweats. Breathing has significantly improved and she is now ambulating without oxygen. No chest pain. Grade 1 neuropathy.  REVIEW OF SYSTEMS:    10 Point review of systems of done and is negative except as noted above.  . Past Medical History:  Diagnosis Date  . ARDS (adult respiratory distress syndrome) ([place])   . Asthma   . Hodgkin lymphoma  ([name])   . Hypertension     . Past Surgical History:  Procedure Laterality Date  . AXILLARY LYMPH NODE BIOPSY Right 03/19/2016   Procedure: AXILLARY LYMPH NODE BIOPSY;  Surgeon: Armandina Gemma, MD;  [facility];  Service: General;  Laterality: Right;  . IR GENERIC HISTORICAL  12/22/2016   IR FLUORO GUIDE PORT INSERTION LEFT 12/22/2016 [facility]  . IR GENERIC HISTORICAL  12/22/2016   IR US GUIDE VASC ACCESS LEFT 12/22/2016 [facility]  . IR GENERIC HISTORICAL  12/22/2016   IR CV LINE INJECTION 12/22/2016 [facility]  . IR GENERIC HISTORICAL  12/22/2016   IR REMOVAL TUN ACCESS W/ PORT W/O FL MOD SED 12/22/2016 [facility]  . VIDEO BRONCHOSCOPY Bilateral 11/26/2016   Procedure: VIDEO BRONCHOSCOPY WITH FLUORO;  Surgeon: Rigoberto Noel, MD;  [facility];  Service: Cardiopulmonary;  Laterality: Bilateral;    . Social History  Substance Use Topics  . Smoking status: Former Smoker    Packs/day: 0.50    Years: 15.00    Types: Cigarettes    Quit date: 03/27/2016  . Smokeless tobacco: Never Used  . Alcohol use Yes     Comment: occasional now    ALLERGIES:  has No Known Allergies.  MEDICATIONS:  Current Outpatient Prescriptions  Medication Sig Dispense Refill  . dexamethasone (DECADRON) 4 MG tablet 2 tab (8mg ) twice a day for 3 days starting on day of chemotherapy 30 tablet 3  . guaiFENesin (ROBITUSSIN) 100 MG/5ML SOLN Take 5 mLs (100 mg total) by mouth every 6 (six) hours as needed for cough or to loosen phlegm. 1200 mL 0  . labetalol (NORMODYNE) 200 MG tablet Take 1 tablet (200 mg  total) by mouth 3 (three) times daily. 90 tablet 1  . levalbuterol (XOPENEX HFA) 45 MCG/ACT inhaler Inhale 2 puffs into the lungs every 6 (six) hours as needed for wheezing. 1 Inhaler 12  . levofloxacin (LEVAQUIN) 750 MG tablet Take 1 tablet (750 mg total) by mouth daily. 3 tablet 0  . lidocaine-prilocaine (EMLA) cream Apply 1 application topically as needed (numbing). 30 g 1  .  loratadine (CLARITIN) 10 MG tablet Take 1 tablet (10 mg total) by mouth daily. For up to 5 days following Neulasta injection. 5 tablet 2  . ondansetron (ZOFRAN) 8 MG tablet Take 1 tablet (8 mg total) by mouth every 8 (eight) hours as needed for nausea (start on the 3rd day after chemotherapy). 30 tablet 3  . prochlorperazine (COMPAZINE) 10 MG tablet Take 1 tablet (10 mg total) by mouth every 6 (six) hours as needed for nausea or vomiting. 30 tablet 3  . Rivaroxaban (XARELTO) 15 MG TABS tablet Take 1 tablet (15 mg total) by mouth 2 (two) times daily with a meal. 42 tablet 0  . rivaroxaban (XARELTO) 20 MG TABS tablet Take 1 tablet (20 mg total) by mouth daily with supper. 30 tablet 1   No current facility-administered medications for this visit.     PHYSICAL EXAMINATION: ECOG PERFORMANCE STATUS: 1 - Symptomatic but completely ambulatory  . Vitals:   03/03/17 1248  BP: 125/84  Pulse: 62  Resp: 18  Temp: 98.2 F (36.8 C)    Filed Weights   03/03/17 1248  Weight: 129 lb 3 oz (58.6 kg)   .Body mass index is 20.23 kg/m.  OROPHARYNX: Moist mucous membranes No thrush NECK: supple, no JVD LYMPH:No palpable cervical , axillary or inguinal LNadenoapthy LUNGS:  improved air entry , b/l scattered rales HEART: regular rate & rhythm ABDOMEN: abdomen soft, nontender, normoactive bowel sounds Extremities no edema  NEURO: Awake alert and oriented 3, moving all 4 extremities. No overt focal neurological deficit.  LABORATORY DATA:   I have reviewed the data as listed  . CBC Latest Ref Rng & Units 03/03/2017 02/10/2017 01/20/2017  WBC 3.9 - 10.3 10e3/uL 6.7 6.1 10.1  Hemoglobin 11.6 - 15.9 g/dL 11.4(L) 10.7(L) 10.2(L)  Hematocrit 34.8 - 46.6 % 35.4 33.0(L) 31.2(L)  Platelets 145 - 400 10e3/uL 409(H) 392 398   . CBC    Component Value Date/Time   WBC 6.7 03/03/2017 1234   WBC 12.5 (H) 12/22/2016 0509   RBC 4.02 03/03/2017 1234   RBC 3.44 (L) 12/22/2016 0509   HGB 11.4 (L) 03/03/2017  1234   HCT 35.4 03/03/2017 1234   PLT 409 (H) 03/03/2017 1234   MCV 88.1 03/03/2017 1234   MCH 28.4 03/03/2017 1234   MCH 30.5 12/22/2016 0509   MCHC 32.2 03/03/2017 1234   MCHC 31.7 12/22/2016 0509   RDW 16.1 (H) 03/03/2017 1234   LYMPHSABS 0.3 (L) 03/03/2017 1234   MONOABS 0.5 03/03/2017 1234   EOSABS 0.2 03/03/2017 1234   BASOSABS 0.0 03/03/2017 1234   . CMP Latest Ref Rng & Units 03/03/2017 02/10/2017 01/20/2017  Glucose 70 - 140 mg/dl 96 92 118  BUN 7.0 - 26.0 mg/dL 4.3(L) <4.0(L) 4.3(L)  Creatinine 0.6 - 1.1 mg/dL 0.6 0.6 0.6  Sodium 136 - 145 mEq/L 138 137 135(L)  Potassium 3.5 - 5.1 mEq/L 4.0 3.8 3.8  Chloride 101 - 111 mmol/L - - -  CO2 22 - 29 mEq/L 29 29 32(H)  Calcium 8.4 - 10.4 mg/dL 9.7 9.2 8.5  Total  Protein 6.4 - 8.3 g/dL 7.5 7.1 5.5(L)  Total Bilirubin 0.20 - 1.20 mg/dL 0.47 0.38 0.40  Alkaline Phos 40 - 150 U/L 100 89 106  AST 5 - 34 U/L 17 15 10   ALT 0-55 U/L U/L 6 7 9     RADIOGRAPHIC STUDIES: I have personally reviewed the radiological images as listed and agreed with the findings in the report. No results found.  ASSESSMENT & PLAN:   31 year old African-American female with  #1 Refractory/Progressive Mixed cellularity Hodgkin's lymphoma IV B E with extensive lymphadenopathy including right axillary, mediastinal and upper retroperitoneal and now biopsy proven pulmonary involvement. She was noted to have significant constitutional symptoms including significant weight loss, fevers chills and some night sweats. HIV negative Hepatitis C and hepatitis B serologies negative. Echo with normal ejection fraction. Patient was treated with 5 cycles of AVD (Bleomycin held due to poor DLCO 37% and ongoing smoking). Multiple avoidable treatment delays due to the patient's noncompliance with follow-up for avoidable reasons. She has been counseled repeatedly that this would increase the likelihood of unfavorable outcome.  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 S/p 3rd cycle of 2nd line treatment with Bendamustine/Brentuximab  #2 hypoxic respiratory failure with dense right lung consolidation and left upper lobe consolidation with SVC syndrome. Patient has completed palliative radiation to the right lung mass causing SVC compression and has had her port removed. Is on anticoagulation with Xarelto for possible thrombus. 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  #4 Non compliance with clinic and treatment followup. Missed 2nd dose of bendamustine with C2. Plan -lab stable. No prohibitive toxicities. 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 -appropriate to proceed with 4th cycle of  Bendamustine/Brentuximab -missed scheduled PET/CT appointment has been rescheduled for 03/16/2017 -referral to [facility] for evaluation regarding AutoHSCT candidacy to determine options for further course of action has been made and patient had an appointment on 02/15/2017 with Dr Dellis Filbert which she missed and hasn't called to reschedule. She understands that it is her responsibility to call and re-schedule for consultation with Dr Dellis Filbert. -continue Xarelto at this time -She was again counseled regarding absolute smoking cessation  -patient was strongly recommended to establish a PCP ASAP.   -continue treatment as per orders -PET/CT ASAP (was ordered last clinic visit) -RTC with Dr Irene Limbo in 3 week C5D1   I spent 20 minutes counseling the patient face to face. The total time spent in the appointment was 25 minutes and more than 50% was on counseling and direct patient cares.    Sullivan Lone MD [place] AAHIVMS [facility] Hematology/Oncology Physician [facility]  (Office):       [phone] (Work cell):  [phone] (Fax):           [phone]

## 2017-03-03 NOTE — Patient Instructions (Signed)
-  please call [place] [facility] to reschedule your appointment for transplantation.

## 2017-03-03 NOTE — Progress Notes (Signed)
Treanda dosage is ok per pharmacy.

## 2017-03-03 NOTE — Patient Instructions (Signed)
[place] Discharge Instructions for Patients Receiving Chemotherapy  Today you received the following chemotherapy agents Adcetris & Bendeka  To help prevent nausea and vomiting after your treatment, we encourage you to take your nausea medication as directed.  If you develop nausea and vomiting that is not controlled by your nausea medication, call the clinic.   BELOW ARE SYMPTOMS THAT SHOULD BE REPORTED IMMEDIATELY:  *FEVER GREATER THAN 100.5 F  *CHILLS WITH OR WITHOUT FEVER  NAUSEA AND VOMITING THAT IS NOT CONTROLLED WITH YOUR NAUSEA MEDICATION  *UNUSUAL SHORTNESS OF BREATH  *UNUSUAL BRUISING OR BLEEDING  TENDERNESS IN MOUTH AND THROAT WITH OR WITHOUT PRESENCE OF ULCERS  *URINARY PROBLEMS  *BOWEL PROBLEMS  UNUSUAL RASH Items with * indicate a potential emergency and should be followed up as soon as possible.  Feel free to call the clinic you have any questions or concerns. The clinic phone number is [phone].  Please show the [place] at check-in to the Emergency Department and triage nurse.

## 2017-03-04 ENCOUNTER — Ambulatory Visit: Payer: Medicaid Other

## 2017-03-04 ENCOUNTER — Ambulatory Visit (HOSPITAL_BASED_OUTPATIENT_CLINIC_OR_DEPARTMENT_OTHER): Payer: Medicaid Other

## 2017-03-04 VITALS — BP 131/80 | HR 98 | Temp 97.4°F | Resp 16

## 2017-03-04 DIAGNOSIS — Z452 Encounter for adjustment and management of vascular access device: Secondary | ICD-10-CM

## 2017-03-04 DIAGNOSIS — I871 Compression of vein: Secondary | ICD-10-CM

## 2017-03-04 DIAGNOSIS — C8128 Mixed cellularity classical Hodgkin lymphoma, lymph nodes of multiple sites: Secondary | ICD-10-CM

## 2017-03-04 DIAGNOSIS — Z5111 Encounter for antineoplastic chemotherapy: Secondary | ICD-10-CM

## 2017-03-04 LAB — SEDIMENTATION RATE: SED RATE: 27 mm/h (ref 0–32)

## 2017-03-04 MED ORDER — DEXAMETHASONE SODIUM PHOSPHATE 10 MG/ML IJ SOLN
10.0000 mg | Freq: Once | INTRAMUSCULAR | Status: AC
  Administered 2017-03-04: 10 mg via INTRAVENOUS

## 2017-03-04 MED ORDER — DIPHENHYDRAMINE HCL 50 MG/ML IJ SOLN
50.0000 mg | Freq: Once | INTRAMUSCULAR | Status: AC
  Administered 2017-03-04: 50 mg via INTRAVENOUS

## 2017-03-04 MED ORDER — ACETAMINOPHEN 325 MG PO TABS
ORAL_TABLET | ORAL | Status: AC
  Filled 2017-03-04: qty 2

## 2017-03-04 MED ORDER — SODIUM CHLORIDE 0.9 % IV SOLN
90.0000 mg/m2 | Freq: Once | INTRAVENOUS | Status: AC
  Administered 2017-03-04: 170 mg via INTRAVENOUS
  Filled 2017-03-04: qty 20

## 2017-03-04 MED ORDER — DEXAMETHASONE SODIUM PHOSPHATE 10 MG/ML IJ SOLN
INTRAMUSCULAR | Status: AC
  Filled 2017-03-04: qty 1

## 2017-03-04 MED ORDER — SODIUM CHLORIDE 0.9 % IV SOLN
Freq: Once | INTRAVENOUS | Status: AC
  Administered 2017-03-04: 14:00:00 via INTRAVENOUS

## 2017-03-04 MED ORDER — DIPHENHYDRAMINE HCL 50 MG/ML IJ SOLN
INTRAMUSCULAR | Status: AC
  Filled 2017-03-04: qty 1

## 2017-03-04 MED ORDER — SODIUM CHLORIDE 0.9% FLUSH
10.0000 mL | INTRAVENOUS | Status: DC | PRN
  Administered 2017-03-04: 10 mL
  Filled 2017-03-04: qty 10

## 2017-03-04 MED ORDER — ACETAMINOPHEN 325 MG PO TABS
650.0000 mg | ORAL_TABLET | Freq: Once | ORAL | Status: AC
  Administered 2017-03-04: 650 mg via ORAL

## 2017-03-04 MED ORDER — ALTEPLASE 2 MG IJ SOLR
2.0000 mg | Freq: Once | INTRAMUSCULAR | Status: AC | PRN
  Administered 2017-03-04: 2 mg
  Filled 2017-03-04: qty 2

## 2017-03-04 MED ORDER — HEPARIN SOD (PORK) LOCK FLUSH 100 UNIT/ML IV SOLN
500.0000 [IU] | Freq: Once | INTRAVENOUS | Status: AC | PRN
  Administered 2017-03-04: 500 [IU]
  Filled 2017-03-04: qty 5

## 2017-03-04 NOTE — Patient Instructions (Signed)
[facility] Discharge Instructions for Patients Receiving Chemotherapy  Today you received the following chemotherapy agents Bendamustine.   To help prevent nausea and vomiting after your treatment, we encourage you to take your nausea medication.   If you develop nausea and vomiting that is not controlled by your nausea medication, call the clinic.   BELOW ARE SYMPTOMS THAT SHOULD BE REPORTED IMMEDIATELY:  *FEVER GREATER THAN 100.5 F  *CHILLS WITH OR WITHOUT FEVER  NAUSEA AND VOMITING THAT IS NOT CONTROLLED WITH YOUR NAUSEA MEDICATION  *UNUSUAL SHORTNESS OF BREATH  *UNUSUAL BRUISING OR BLEEDING  TENDERNESS IN MOUTH AND THROAT WITH OR WITHOUT PRESENCE OF ULCERS  *URINARY PROBLEMS  *BOWEL PROBLEMS  UNUSUAL RASH Items with * indicate a potential emergency and should be followed up as soon as possible.  Feel free to call the clinic you have any questions or concerns. The clinic phone number is [phone].  Please show the CHEMO ALERT CARD at check-in to the Emergency Department and triage nurse.   

## 2017-03-07 ENCOUNTER — Telehealth: Payer: Self-pay | Admitting: *Deleted

## 2017-03-07 NOTE — Telephone Encounter (Signed)
Patient scheduled for PET Scan 03/16/16 @ 1130. Informed patient that it was at [facility] and NPO after midnight. Patient verbalized understanding.

## 2017-03-11 ENCOUNTER — Telehealth: Payer: Self-pay | Admitting: Hematology

## 2017-03-11 NOTE — Telephone Encounter (Signed)
Spoke with patient re appointments for 3/29 and 3/30.

## 2017-03-16 ENCOUNTER — Ambulatory Visit (HOSPITAL_COMMUNITY): Payer: Medicaid Other

## 2017-03-18 ENCOUNTER — Other Ambulatory Visit: Payer: Self-pay | Admitting: *Deleted

## 2017-03-18 MED ORDER — LIDOCAINE-PRILOCAINE 2.5-2.5 % EX CREA: 1.0000 "application " | TOPICAL_CREAM | CUTANEOUS | 1 refills | Status: DC | PRN

## 2017-03-21 ENCOUNTER — Telehealth: Payer: Self-pay | Admitting: *Deleted

## 2017-03-21 NOTE — Telephone Encounter (Signed)
Per MD staff message, called pt to f/u on status of PET scan.  Pt stated scan was cancelled last week due to drinking juice prior to scan.  Pt stated she left message last week to reschedule apt, no one has called her back.  Pt was instructed to call number again to reschedule scan.  Pt informed that she may not be able to receive treatment this week without results of scan.  Per staff message, asked pt if she has been able to set up PCP.  Pt stated she had not set up apt with PCP, instructed pt that Dr. Irene Limbo is recommending this be done ASAP.  Instructed pt to call us back if she is unable to set up apt for PET scan.  Pt verbalized understanding.

## 2017-03-24 ENCOUNTER — Encounter: Payer: Self-pay | Admitting: *Deleted

## 2017-03-24 ENCOUNTER — Other Ambulatory Visit: Payer: Self-pay | Admitting: *Deleted

## 2017-03-24 ENCOUNTER — Ambulatory Visit (HOSPITAL_BASED_OUTPATIENT_CLINIC_OR_DEPARTMENT_OTHER): Payer: Medicaid Other | Admitting: Hematology

## 2017-03-24 ENCOUNTER — Other Ambulatory Visit (HOSPITAL_BASED_OUTPATIENT_CLINIC_OR_DEPARTMENT_OTHER): Payer: Medicaid Other

## 2017-03-24 ENCOUNTER — Telehealth: Payer: Self-pay | Admitting: Hematology

## 2017-03-24 ENCOUNTER — Ambulatory Visit (HOSPITAL_BASED_OUTPATIENT_CLINIC_OR_DEPARTMENT_OTHER): Payer: Medicaid Other

## 2017-03-24 VITALS — BP 127/80 | HR 115 | Temp 98.9°F | Resp 18 | Ht 67.0 in | Wt 132.0 lb

## 2017-03-24 DIAGNOSIS — I871 Compression of vein: Secondary | ICD-10-CM | POA: Diagnosis not present

## 2017-03-24 DIAGNOSIS — C8128 Mixed cellularity classical Hodgkin lymphoma, lymph nodes of multiple sites: Secondary | ICD-10-CM

## 2017-03-24 DIAGNOSIS — Z5111 Encounter for antineoplastic chemotherapy: Secondary | ICD-10-CM | POA: Diagnosis not present

## 2017-03-24 DIAGNOSIS — G620 Drug-induced polyneuropathy: Secondary | ICD-10-CM

## 2017-03-24 DIAGNOSIS — Z5112 Encounter for antineoplastic immunotherapy: Secondary | ICD-10-CM | POA: Diagnosis not present

## 2017-03-24 LAB — CBC WITH DIFFERENTIAL/PLATELET
BASO%: 0.2 % (ref 0.0–2.0)
BASOS ABS: 0 10*3/uL (ref 0.0–0.1)
EOS ABS: 0.2 10*3/uL (ref 0.0–0.5)
EOS%: 3.9 % (ref 0.0–7.0)
HEMATOCRIT: 32 % — AB (ref 34.8–46.6)
HGB: 10.6 g/dL — ABNORMAL LOW (ref 11.6–15.9)
LYMPH%: 5.6 % — AB (ref 14.0–49.7)
MCH: 28.6 pg (ref 25.1–34.0)
MCHC: 33.1 g/dL (ref 31.5–36.0)
MCV: 86.3 fL (ref 79.5–101.0)
MONO#: 0.4 10*3/uL (ref 0.1–0.9)
MONO%: 8.2 % (ref 0.0–14.0)
NEUT#: 4.2 10*3/uL (ref 1.5–6.5)
NEUT%: 82.1 % — AB (ref 38.4–76.8)
PLATELETS: 306 10*3/uL (ref 145–400)
RBC: 3.71 10*6/uL (ref 3.70–5.45)
RDW: 16.2 % — ABNORMAL HIGH (ref 11.2–14.5)
WBC: 5.1 10*3/uL (ref 3.9–10.3)
lymph#: 0.3 10*3/uL — ABNORMAL LOW (ref 0.9–3.3)
nRBC: 0 % (ref 0–0)

## 2017-03-24 LAB — COMPREHENSIVE METABOLIC PANEL
ALT: 7 U/L (ref 0–55)
ANION GAP: 10 meq/L (ref 3–11)
AST: 16 U/L (ref 5–34)
Albumin: 3.2 g/dL — ABNORMAL LOW (ref 3.5–5.0)
Alkaline Phosphatase: 89 U/L (ref 40–150)
BUN: 6 mg/dL — ABNORMAL LOW (ref 7.0–26.0)
CALCIUM: 9.1 mg/dL (ref 8.4–10.4)
CHLORIDE: 101 meq/L (ref 98–109)
CO2: 27 meq/L (ref 22–29)
CREATININE: 0.6 mg/dL (ref 0.6–1.1)
Glucose: 80 mg/dl (ref 70–140)
Potassium: 4.2 mEq/L (ref 3.5–5.1)
Sodium: 138 mEq/L (ref 136–145)
Total Bilirubin: 0.42 mg/dL (ref 0.20–1.20)
Total Protein: 7 g/dL (ref 6.4–8.3)

## 2017-03-24 MED ORDER — PALONOSETRON HCL INJECTION 0.25 MG/5ML
0.2500 mg | Freq: Once | INTRAVENOUS | Status: AC
  Administered 2017-03-24: 0.25 mg via INTRAVENOUS

## 2017-03-24 MED ORDER — SODIUM CHLORIDE 0.9 % IV SOLN
90.0000 mg/m2 | Freq: Once | INTRAVENOUS | Status: AC
  Administered 2017-03-24: 170 mg via INTRAVENOUS
  Filled 2017-03-24: qty 20

## 2017-03-24 MED ORDER — HEPARIN SOD (PORK) LOCK FLUSH 100 UNIT/ML IV SOLN
500.0000 [IU] | Freq: Once | INTRAVENOUS | Status: AC | PRN
  Administered 2017-03-24: 500 [IU]
  Filled 2017-03-24: qty 5

## 2017-03-24 MED ORDER — DEXAMETHASONE SODIUM PHOSPHATE 10 MG/ML IJ SOLN
10.0000 mg | Freq: Once | INTRAMUSCULAR | Status: AC
  Administered 2017-03-24: 10 mg via INTRAVENOUS

## 2017-03-24 MED ORDER — SODIUM CHLORIDE 0.9 % IV SOLN
1.8000 mg/kg | Freq: Once | INTRAVENOUS | Status: AC
  Administered 2017-03-24: 135 mg via INTRAVENOUS
  Filled 2017-03-24: qty 27

## 2017-03-24 MED ORDER — DIPHENHYDRAMINE HCL 50 MG/ML IJ SOLN
INTRAMUSCULAR | Status: AC
  Filled 2017-03-24: qty 1

## 2017-03-24 MED ORDER — SODIUM CHLORIDE 0.9% FLUSH
10.0000 mL | INTRAVENOUS | Status: DC | PRN
  Administered 2017-03-24: 10 mL
  Filled 2017-03-24: qty 10

## 2017-03-24 MED ORDER — DEXAMETHASONE SODIUM PHOSPHATE 10 MG/ML IJ SOLN
INTRAMUSCULAR | Status: AC
  Filled 2017-03-24: qty 1

## 2017-03-24 MED ORDER — ACETAMINOPHEN 325 MG PO TABS
650.0000 mg | ORAL_TABLET | Freq: Once | ORAL | Status: AC
Start: 2017-03-24 — End: 2017-03-24
  Administered 2017-03-24: 650 mg via ORAL

## 2017-03-24 MED ORDER — DIPHENHYDRAMINE HCL 50 MG/ML IJ SOLN
50.0000 mg | Freq: Once | INTRAMUSCULAR | Status: AC
Start: 2017-03-24 — End: 2017-03-24
  Administered 2017-03-24: 50 mg via INTRAVENOUS

## 2017-03-24 MED ORDER — ACETAMINOPHEN 325 MG PO TABS
ORAL_TABLET | ORAL | Status: AC
  Filled 2017-03-24: qty 2

## 2017-03-24 MED ORDER — PALONOSETRON HCL INJECTION 0.25 MG/5ML
INTRAVENOUS | Status: AC
  Filled 2017-03-24: qty 5

## 2017-03-24 MED ORDER — SODIUM CHLORIDE 0.9 % IV SOLN
Freq: Once | INTRAVENOUS | Status: AC
  Administered 2017-03-24: 12:00:00 via INTRAVENOUS

## 2017-03-24 NOTE — Progress Notes (Signed)
[place] Work  Clinical Social Work was referred by [employer] for assessment of psychosocial needs due to transportation concerns.  Clinical Social Worker met with patient at [facility] to offer support and assess for needs.  Pt continues to have difficulty getting to her appointments. CSW re-educated her on options for transportation; SCAT, ACS, Medicaid and PART. Pt has many options to get to medical appointments and CSW reviewed how far in advance to set up these resources. Pt reports she was denied SCAT services and CSW contacted SCAT to inquire on status of application. Per Floydene Flock at [facility], pt's application was closed as pt did not attend SCAT interview on Feb. 21. Pt just needs to reschedule SCAT interview and she should be approved. CSW will inform pt she can use SCAT once this step is completed. Medicaid can also get her to medical appointments locally and at [facility].    Clinical Social Work interventions:  Resource education and referral  Loren Racer, LCSW, OSW-C Clinical Social Worker [place]  [place] Phone: [phone] Fax: [phone]

## 2017-03-24 NOTE — Patient Instructions (Signed)
Thank you for choosing [facility] to provide your oncology and hematology care.  To afford each patient quality time with our providers, please arrive 30 minutes before your scheduled appointment time.  If you arrive late for your appointment, you may be asked to reschedule.  We strive to give you quality time with our providers, and arriving late affects you and other patients whose appointments are after yours.  If you are a no show for multiple scheduled visits, you may be dismissed from the clinic at the providers discretion.   Again, thank you for choosing [facility], our hope is that these requests will decrease the amount of time that you wait before being seen by our physicians.  ______________________________________________________________________ Should you have questions after your visit to the [facility], please contact our office at [phone] between the hours of 8:30 and 4:30 p.m.    Voicemails left after 4:30p.m will not be returned until the following business day.   For prescription refill requests, please have your pharmacy contact us directly.  Please also try to allow 48 hours for prescription requests.   Please contact the scheduling department for questions regarding scheduling.  For scheduling of procedures such as PET scans, CT scans, MRI, Ultrasound, etc please contact central scheduling at [phone].   Resources For Cancer Patients and Caregivers:  American Cancer Society:  800-227-2345  Can help patients locate various types of support and financial assistance Cancer Care: 1-800-813-HOPE (4673) Provides financial assistance, online support groups, medication/co-pay assistance.   [place] DSS:  336-641-3447 Where to apply for food stamps, Medicaid, and utility assistance Medicare Rights Center: 800-333-4114 Helps people with Medicare understand their rights and benefits, navigate the Medicare system, and secure the  quality healthcare they deserve SCAT: 336-333-6589 [place] [employer]'s shared-ride transportation service for eligible riders who have a disability that prevents them from riding the fixed route bus.   For additional information on assistance programs please contact our social worker:   Grier Hock/Abigail Elmore:  [phone] 

## 2017-03-24 NOTE — Telephone Encounter (Signed)
Gave patient avs report and appointments for March and April.  °

## 2017-03-24 NOTE — Patient Instructions (Signed)
[facility] Discharge Instructions for Patients Receiving Chemotherapy  Today you received the following chemotherapy agents Adcetris & Bendamustine.  To help prevent nausea and vomiting after your treatment, we encourage you to take your nausea medication as directed.  If you develop nausea and vomiting that is not controlled by your nausea medication, call the clinic.   BELOW ARE SYMPTOMS THAT SHOULD BE REPORTED IMMEDIATELY:  *FEVER GREATER THAN 100.5 F  *CHILLS WITH OR WITHOUT FEVER  NAUSEA AND VOMITING THAT IS NOT CONTROLLED WITH YOUR NAUSEA MEDICATION  *UNUSUAL SHORTNESS OF BREATH  *UNUSUAL BRUISING OR BLEEDING  TENDERNESS IN MOUTH AND THROAT WITH OR WITHOUT PRESENCE OF ULCERS  *URINARY PROBLEMS  *BOWEL PROBLEMS  UNUSUAL RASH Items with * indicate a potential emergency and should be followed up as soon as possible.  Feel free to call the clinic you have any questions or concerns. The clinic phone number is [phone].  Please show the [place] at check-in to the Emergency Department and triage nurse.

## 2017-03-25 ENCOUNTER — Ambulatory Visit (HOSPITAL_BASED_OUTPATIENT_CLINIC_OR_DEPARTMENT_OTHER): Payer: Medicaid Other

## 2017-03-25 VITALS — BP 132/74 | HR 98 | Temp 98.7°F | Resp 16

## 2017-03-25 DIAGNOSIS — I871 Compression of vein: Secondary | ICD-10-CM

## 2017-03-25 DIAGNOSIS — Z452 Encounter for adjustment and management of vascular access device: Secondary | ICD-10-CM | POA: Diagnosis not present

## 2017-03-25 DIAGNOSIS — Z5111 Encounter for antineoplastic chemotherapy: Secondary | ICD-10-CM

## 2017-03-25 DIAGNOSIS — C8128 Mixed cellularity classical Hodgkin lymphoma, lymph nodes of multiple sites: Secondary | ICD-10-CM

## 2017-03-25 MED ORDER — ALTEPLASE 2 MG IJ SOLR
2.0000 mg | Freq: Once | INTRAMUSCULAR | Status: AC | PRN
  Administered 2017-03-25: 2 mg
  Filled 2017-03-25: qty 2

## 2017-03-25 MED ORDER — ACETAMINOPHEN 325 MG PO TABS
650.0000 mg | ORAL_TABLET | Freq: Once | ORAL | Status: AC
  Administered 2017-03-25: 650 mg via ORAL

## 2017-03-25 MED ORDER — SODIUM CHLORIDE 0.9% FLUSH
10.0000 mL | INTRAVENOUS | Status: DC | PRN
  Administered 2017-03-25: 10 mL
  Filled 2017-03-25: qty 10

## 2017-03-25 MED ORDER — DEXAMETHASONE SODIUM PHOSPHATE 10 MG/ML IJ SOLN
INTRAMUSCULAR | Status: AC
  Filled 2017-03-25: qty 1

## 2017-03-25 MED ORDER — SODIUM CHLORIDE 0.9 % IV SOLN
Freq: Once | INTRAVENOUS | Status: AC
  Administered 2017-03-25: 13:00:00 via INTRAVENOUS

## 2017-03-25 MED ORDER — BENDAMUSTINE HCL (LYOPHILIZED PWD) CHEMO INJECTION 100MG
90.0000 mg/m2 | Freq: Once | INTRAVENOUS | Status: AC
  Administered 2017-03-25: 170 mg via INTRAVENOUS
  Filled 2017-03-25: qty 20

## 2017-03-25 MED ORDER — DIPHENHYDRAMINE HCL 50 MG/ML IJ SOLN
50.0000 mg | Freq: Once | INTRAMUSCULAR | Status: AC
  Administered 2017-03-25: 50 mg via INTRAVENOUS

## 2017-03-25 MED ORDER — HEPARIN SOD (PORK) LOCK FLUSH 100 UNIT/ML IV SOLN
500.0000 [IU] | Freq: Once | INTRAVENOUS | Status: AC | PRN
  Administered 2017-03-25: 500 [IU]
  Filled 2017-03-25: qty 5

## 2017-03-25 MED ORDER — DEXAMETHASONE SODIUM PHOSPHATE 10 MG/ML IJ SOLN
10.0000 mg | Freq: Once | INTRAMUSCULAR | Status: AC
  Administered 2017-03-25: 10 mg via INTRAVENOUS

## 2017-03-25 MED ORDER — ACETAMINOPHEN 325 MG PO TABS
ORAL_TABLET | ORAL | Status: AC
  Filled 2017-03-25: qty 2

## 2017-03-25 MED ORDER — DIPHENHYDRAMINE HCL 50 MG/ML IJ SOLN
INTRAMUSCULAR | Status: AC
  Filled 2017-03-25: qty 1

## 2017-03-28 ENCOUNTER — Ambulatory Visit (HOSPITAL_COMMUNITY): Payer: Medicaid Other

## 2017-03-30 NOTE — Progress Notes (Signed)
[name]  HEMATOLOGY ONCOLOGY PROGRESS NOTE  Date of service: .03/24/2017   Patient Care Team: No Pcp Per Patient as PCP - General (General Practice)  Chief complaint: Follow-up for Hodgkin's lymphoma  Diagnosis:   Refractory Mixed cellularity Hodgkin's lymphoma IVBE with extensive lymphadenopathy including right axillary, mediastinal and upper retroperitoneal and now biopsy proven pulmonary involvement. She was noted to have significant constitutional symptoms including significant weight loss, fevers chills and some night sweats.  Current Treatment:  on 2nd line therapy with Bendamustine + Brentuximab s/p C3 on 12/30/2016   Previous treatment 5 cycles of AVD (without bleomycin due to lung involvement and DLCO of 37%, active smoker) Multiple avoidable treatment delays due to the patient's noncompliance with follow-up for avoidable reasons. She has been counseled repeatedly that this would increase the likelihood of unfavorable outcome.  INTERVAL HISTORY:  Ms Brummet is here for f/u prior to her 5th cycle of Bendamustine/Brentuximab. She did not followup for her PET/CT as per her appointment on 03/16/2017. Notes significant social issues with her friends and children.  Still has not called to reschedule her appointment at [facility] with Dr Dellis Filbert for consideration of Auto HSCT . SW again counseled her regarding transportation and financial resource available to her.. Notes no fevers no chills no night sweats. Breathing has significantly improved and she is now ambulating without oxygen. No chest pain. Grade 1 neuropathy unchanged. No other prohibitive toxicities. REVIEW OF SYSTEMS:     10 Point review of systems of done and is negative except as noted above.  . Past Medical History:  Diagnosis Date  . ARDS (adult respiratory distress syndrome) ([place])   . Asthma   . Hodgkin lymphoma ([place])   . Hypertension     . Past Surgical History:  Procedure Laterality Date  . AXILLARY LYMPH  NODE BIOPSY Right 03/19/2016   Procedure: AXILLARY LYMPH NODE BIOPSY;  Surgeon: Armandina Gemma, MD;  Location: [facility] ORS;  Service: General;  Laterality: Right;  . IR GENERIC HISTORICAL  12/22/2016   IR FLUORO GUIDE PORT INSERTION LEFT 12/22/2016 [facility]  . IR GENERIC HISTORICAL  12/22/2016   IR US GUIDE VASC ACCESS LEFT 12/22/2016 [facility]  . IR GENERIC HISTORICAL  12/22/2016   IR CV LINE INJECTION 12/22/2016 [facility]  . IR GENERIC HISTORICAL  12/22/2016   IR REMOVAL TUN ACCESS W/ PORT W/O FL MOD SED 12/22/2016 [facility]  . VIDEO BRONCHOSCOPY Bilateral 11/26/2016   Procedure: VIDEO BRONCHOSCOPY WITH FLUORO;  Surgeon: Rigoberto Noel, MD;  Location: [facility] ENDOSCOPY;  Service: Cardiopulmonary;  Laterality: Bilateral;    . Social History  Substance Use Topics  . Smoking status: Former Smoker    Packs/day: 0.50    Years: 15.00    Types: Cigarettes    Quit date: 03/27/2016  . Smokeless tobacco: Never Used  . Alcohol use Yes     Comment: occasional now    ALLERGIES:  has No Known Allergies.  MEDICATIONS:  Current Outpatient Prescriptions  Medication Sig Dispense Refill  . dexamethasone (DECADRON) 4 MG tablet 2 tab (8mg ) twice a day for 3 days starting on day of chemotherapy 30 tablet 3  . guaiFENesin (ROBITUSSIN) 100 MG/5ML SOLN Take 5 mLs (100 mg total) by mouth every 6 (six) hours as needed for cough or to loosen phlegm. 1200 mL 0  . labetalol (NORMODYNE) 200 MG tablet Take 1 tablet (200 mg total) by mouth 3 (three) times daily. 90 tablet 1  . levalbuterol (XOPENEX HFA) 45 MCG/ACT inhaler Inhale  2 puffs into the lungs every 6 (six) hours as needed for wheezing. 1 Inhaler 12  . levofloxacin (LEVAQUIN) 750 MG tablet Take 1 tablet (750 mg total) by mouth daily. 3 tablet 0  . lidocaine-prilocaine (EMLA) cream Apply 1 application topically as needed (numbing). 30 g 1  . loratadine (CLARITIN) 10 MG tablet Take 1 tablet (10 mg total) by mouth daily. For up to 5 days following  Neulasta injection. 5 tablet 2  . ondansetron (ZOFRAN) 8 MG tablet Take 1 tablet (8 mg total) by mouth every 8 (eight) hours as needed for nausea (start on the 3rd day after chemotherapy). 30 tablet 3  . prochlorperazine (COMPAZINE) 10 MG tablet Take 1 tablet (10 mg total) by mouth every 6 (six) hours as needed for nausea or vomiting. 30 tablet 3  . Rivaroxaban (XARELTO) 15 MG TABS tablet Take 1 tablet (15 mg total) by mouth 2 (two) times daily with a meal. 42 tablet 0  . rivaroxaban (XARELTO) 20 MG TABS tablet Take 1 tablet (20 mg total) by mouth daily with supper. 30 tablet 1   No current facility-administered medications for this visit.     PHYSICAL EXAMINATION: ECOG PERFORMANCE STATUS: 1 - Symptomatic but completely ambulatory  . Vitals:   03/24/17 1042  BP: 127/80  Pulse: (!) 115  Resp: 18  Temp: 98.9 F (37.2 C)    Filed Weights   03/24/17 1042  Weight: 132 lb (59.9 kg)   .Body mass index is 20.67 kg/m.  OROPHARYNX: Moist mucous membranes No thrush NECK: supple, no JVD LYMPH:No palpable cervical , axillary or inguinal LNadenoapthy LUNGS:  improved air entry , b/l scattered rales HEART: regular rate & rhythm ABDOMEN: abdomen soft, nontender, normoactive bowel sounds Extremities no edema  NEURO: Awake alert and oriented 3, moving all 4 extremities. No overt focal neurological deficit.  LABORATORY DATA:   I have reviewed the data as listed  . CBC Latest Ref Rng & Units 03/24/2017 03/03/2017 02/10/2017  WBC 3.9 - 10.3 10e3/uL 5.1 6.7 6.1  Hemoglobin 11.6 - 15.9 g/dL 10.6(L) 11.4(L) 10.7(L)  Hematocrit 34.8 - 46.6 % 32.0(L) 35.4 33.0(L)  Platelets 145 - 400 10e3/uL 306 409(H) 392   . CBC    Component Value Date/Time   WBC 5.1 03/24/2017 0959   WBC 12.5 (H) 12/22/2016 0509   RBC 3.71 03/24/2017 0959   RBC 3.44 (L) 12/22/2016 0509   HGB 10.6 (L) 03/24/2017 0959   HCT 32.0 (L) 03/24/2017 0959   PLT 306 03/24/2017 0959   MCV 86.3 03/24/2017 0959   MCH 28.6  03/24/2017 0959   MCH 30.5 12/22/2016 0509   MCHC 33.1 03/24/2017 0959   MCHC 31.7 12/22/2016 0509   RDW 16.2 (H) 03/24/2017 0959   LYMPHSABS 0.3 (L) 03/24/2017 0959   MONOABS 0.4 03/24/2017 0959   EOSABS 0.2 03/24/2017 0959   BASOSABS 0.0 03/24/2017 0959   . CMP Latest Ref Rng & Units 03/24/2017 03/03/2017 02/10/2017  Glucose 70 - 140 mg/dl 80 96 92  BUN 7.0 - 26.0 mg/dL 6.0(L) 4.3(L) <4.0(L)  Creatinine 0.6 - 1.1 mg/dL 0.6 0.6 0.6  Sodium 136 - 145 mEq/L 138 138 137  Potassium 3.5 - 5.1 mEq/L 4.2 4.0 3.8  Chloride 101 - 111 mmol/L - - -  CO2 22 - 29 mEq/L 27 29 29   Calcium 8.4 - 10.4 mg/dL 9.1 9.7 9.2  Total Protein 6.4 - 8.3 g/dL 7.0 7.5 7.1  Total Bilirubin 0.20 - 1.20 mg/dL 0.42 0.47 0.38  Alkaline  Phos 40 - 150 U/L 89 100 89  AST 5 - 34 U/L 16 17 15   ALT 0 - 55 U/L 7 <6 7    RADIOGRAPHIC STUDIES: I have personally reviewed the radiological images as listed and agreed with the findings in the report. No results found.  ASSESSMENT & PLAN:   31 year old African-American female with  #1 Refractory/Progressive Mixed cellularity Hodgkin's lymphoma IV B E with extensive lymphadenopathy including right axillary, mediastinal and upper retroperitoneal and now biopsy proven pulmonary involvement. She was noted to have significant constitutional symptoms including significant weight loss, fevers chills and some night sweats. HIV negative Hepatitis C and hepatitis B serologies negative. Echo with normal ejection fraction. Patient was treated with 5 cycles of AVD (Bleomycin held due to poor DLCO 37% and ongoing smoking). Multiple avoidable treatment delays due to the patient's noncompliance with follow-up for avoidable reasons. She has been counseled repeatedly that this would increase the likelihood of unfavorable outcome.  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 S/p 4th cycle of 2nd line treatment with Bendamustine/Brentuximab  #2 s/p hypoxic  respiratory failure with dense right lung consolidation and left upper lobe consolidation with SVC syndrome. Patient has completed palliative radiation to the right lung mass causing SVC compression and has had her port removed. Is on anticoagulation with Xarelto for possible thrombus. 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  #4 Non compliance with clinic and treatment followup. Missed 2nd dose of bendamustine with C2. Has missed multiple appointment for her PET/CT and missed appointment at [facility] for [facility] of Transplant and still has not re-scheduled despite multiple reminders.. Plan -lab stable. No prohibitive toxicities. 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 -appropriate to proceed with 5th cycle of  Bendamustine/Brentuximab -missed mulltiple scheduled PET/CT appointment has been rescheduled for 03/28/2017 -counseled on important of treatment compliance -reminded to reschedule appointment with Dr Dellis Filbert at [facility] continue Xarelto at this time -She was again counseled regarding absolute smoking cessation  -patient was strongly recommended to establish a PCP ASAP.  Continue treatment as per orders f/u for PET/CT as scheduled on 03/28/2017 RTC with Dr Irene Limbo in 3 weeks with labs C6D1   I spent 20 minutes counseling the patient face to face. The total time spent in the appointment was 25 minutes and more than 50% was on counseling and direct patient cares.    Sullivan Lone MD [place] AAHIVMS [facility] Hematology/Oncology Physician [facility]  (Office):       [phone] (Work cell):  [phone] (Fax):           [phone]

## 2017-04-13 ENCOUNTER — Ambulatory Visit (HOSPITAL_COMMUNITY)
Admission: RE | Admit: 2017-04-13 | Discharge: 2017-04-13 | Disposition: A | Discharge status: Home / Self Care | Payer: Medicaid Other | Source: Ambulatory Visit | Attending: Hematology | Admitting: Hematology

## 2017-04-13 ENCOUNTER — Encounter (HOSPITAL_COMMUNITY): Payer: Self-pay

## 2017-04-13 DIAGNOSIS — I313 Pericardial effusion (noninflammatory): Secondary | ICD-10-CM | POA: Diagnosis not present

## 2017-04-13 DIAGNOSIS — C8128 Mixed cellularity classical Hodgkin lymphoma, lymph nodes of multiple sites: Secondary | ICD-10-CM | POA: Insufficient documentation

## 2017-04-13 LAB — GLUCOSE, CAPILLARY: Glucose-Capillary: 60 mg/dL — ABNORMAL LOW (ref 65–99)

## 2017-04-13 MED ORDER — FLUDEOXYGLUCOSE F - 18 (FDG) INJECTION: 6.6000 | Freq: Once | INTRAVENOUS | Status: DC | PRN

## 2017-04-14 ENCOUNTER — Encounter: Payer: Self-pay | Admitting: Hematology

## 2017-04-14 ENCOUNTER — Telehealth: Payer: Self-pay | Admitting: Hematology

## 2017-04-14 ENCOUNTER — Ambulatory Visit (HOSPITAL_BASED_OUTPATIENT_CLINIC_OR_DEPARTMENT_OTHER): Payer: Medicaid Other

## 2017-04-14 ENCOUNTER — Ambulatory Visit (HOSPITAL_BASED_OUTPATIENT_CLINIC_OR_DEPARTMENT_OTHER): Payer: Medicaid Other | Admitting: Hematology

## 2017-04-14 ENCOUNTER — Other Ambulatory Visit (HOSPITAL_BASED_OUTPATIENT_CLINIC_OR_DEPARTMENT_OTHER): Payer: Medicaid Other

## 2017-04-14 VITALS — BP 149/92 | HR 85 | Temp 98.6°F | Resp 18 | Wt 131.2 lb

## 2017-04-14 DIAGNOSIS — Z5112 Encounter for antineoplastic immunotherapy: Secondary | ICD-10-CM | POA: Diagnosis present

## 2017-04-14 DIAGNOSIS — Z5111 Encounter for antineoplastic chemotherapy: Secondary | ICD-10-CM

## 2017-04-14 DIAGNOSIS — I3139 Other pericardial effusion (noninflammatory): Secondary | ICD-10-CM

## 2017-04-14 DIAGNOSIS — C8128 Mixed cellularity classical Hodgkin lymphoma, lymph nodes of multiple sites: Secondary | ICD-10-CM

## 2017-04-14 DIAGNOSIS — I313 Pericardial effusion (noninflammatory): Secondary | ICD-10-CM | POA: Diagnosis not present

## 2017-04-14 DIAGNOSIS — I871 Compression of vein: Secondary | ICD-10-CM

## 2017-04-14 DIAGNOSIS — G620 Drug-induced polyneuropathy: Secondary | ICD-10-CM | POA: Diagnosis not present

## 2017-04-14 DIAGNOSIS — C8599 Non-Hodgkin lymphoma, unspecified, extranodal and solid organ sites: Secondary | ICD-10-CM

## 2017-04-14 LAB — CBC & DIFF AND RETIC
BASO%: 0.3 % (ref 0.0–2.0)
Basophils Absolute: 0 10*3/uL (ref 0.0–0.1)
EOS ABS: 0.1 10*3/uL (ref 0.0–0.5)
EOS%: 3 % (ref 0.0–7.0)
HEMATOCRIT: 32.2 % — AB (ref 34.8–46.6)
HGB: 10.8 g/dL — ABNORMAL LOW (ref 11.6–15.9)
IMMATURE RETIC FRACT: 5.6 % (ref 1.60–10.00)
LYMPH#: 0.3 10*3/uL — AB (ref 0.9–3.3)
LYMPH%: 9 % — AB (ref 14.0–49.7)
MCH: 29 pg (ref 25.1–34.0)
MCHC: 33.5 g/dL (ref 31.5–36.0)
MCV: 86.3 fL (ref 79.5–101.0)
MONO#: 0.5 10*3/uL (ref 0.1–0.9)
MONO%: 14.2 % — ABNORMAL HIGH (ref 0.0–14.0)
NEUT#: 2.7 10*3/uL (ref 1.5–6.5)
NEUT%: 73.5 % (ref 38.4–76.8)
PLATELETS: 264 10*3/uL (ref 145–400)
RBC: 3.73 10*6/uL (ref 3.70–5.45)
RDW: 17 % — ABNORMAL HIGH (ref 11.2–14.5)
RETIC CT ABS: 98.1 10*3/uL — AB (ref 33.70–90.70)
Retic %: 2.63 % — ABNORMAL HIGH (ref 0.70–2.10)
WBC: 3.7 10*3/uL — ABNORMAL LOW (ref 3.9–10.3)

## 2017-04-14 LAB — COMPREHENSIVE METABOLIC PANEL
ALBUMIN: 3.4 g/dL — AB (ref 3.5–5.0)
ALT: 7 U/L (ref 0–55)
ANION GAP: 9 meq/L (ref 3–11)
AST: 19 U/L (ref 5–34)
Alkaline Phosphatase: 91 U/L (ref 40–150)
BUN: 5.5 mg/dL — ABNORMAL LOW (ref 7.0–26.0)
CALCIUM: 9.4 mg/dL (ref 8.4–10.4)
CO2: 29 meq/L (ref 22–29)
CREATININE: 0.7 mg/dL (ref 0.6–1.1)
Chloride: 104 mEq/L (ref 98–109)
EGFR: >90 mL/min/{1.73_m2} (ref >90)
Glucose: 97 mg/dl (ref 70–140)
Potassium: 4.1 mEq/L (ref 3.5–5.1)
Sodium: 142 mEq/L (ref 136–145)
TOTAL PROTEIN: 6.5 g/dL (ref 6.4–8.3)
Total Bilirubin: 0.52 mg/dL (ref 0.20–1.20)

## 2017-04-14 MED ORDER — DEXAMETHASONE SODIUM PHOSPHATE 10 MG/ML IJ SOLN
INTRAMUSCULAR | Status: AC
  Filled 2017-04-14: qty 1

## 2017-04-14 MED ORDER — BENDAMUSTINE HCL CHEMO INJECTION 100 MG
170.0000 mg | Freq: Once | INTRAVENOUS | Status: AC
Start: 2017-04-14 — End: 2017-04-14
  Administered 2017-04-14: 170 mg via INTRAVENOUS
  Filled 2017-04-14: qty 500

## 2017-04-14 MED ORDER — ACETAMINOPHEN 325 MG PO TABS
ORAL_TABLET | ORAL | Status: AC
  Filled 2017-04-14: qty 2

## 2017-04-14 MED ORDER — DIPHENHYDRAMINE HCL 50 MG/ML IJ SOLN
50.0000 mg | Freq: Once | INTRAMUSCULAR | Status: AC
  Administered 2017-04-14: 50 mg via INTRAVENOUS

## 2017-04-14 MED ORDER — DEXAMETHASONE SODIUM PHOSPHATE 10 MG/ML IJ SOLN
10.0000 mg | Freq: Once | INTRAMUSCULAR | Status: AC
  Administered 2017-04-14: 10 mg via INTRAVENOUS

## 2017-04-14 MED ORDER — DIPHENHYDRAMINE HCL 50 MG/ML IJ SOLN
INTRAMUSCULAR | Status: AC
  Filled 2017-04-14: qty 1

## 2017-04-14 MED ORDER — PALONOSETRON HCL INJECTION 0.25 MG/5ML
INTRAVENOUS | Status: AC
  Filled 2017-04-14: qty 5

## 2017-04-14 MED ORDER — SODIUM CHLORIDE 0.9% FLUSH
10.0000 mL | INTRAVENOUS | Status: DC | PRN
Start: 2017-04-14 — End: 2017-04-14
  Administered 2017-04-14: 10 mL
  Filled 2017-04-14: qty 10

## 2017-04-14 MED ORDER — SODIUM CHLORIDE 0.9 % IV SOLN
Freq: Once | INTRAVENOUS | Status: AC
  Administered 2017-04-14: 14:00:00 via INTRAVENOUS

## 2017-04-14 MED ORDER — HEPARIN SOD (PORK) LOCK FLUSH 100 UNIT/ML IV SOLN
500.0000 [IU] | Freq: Once | INTRAVENOUS | Status: AC | PRN
  Administered 2017-04-14: 500 [IU]
  Filled 2017-04-14: qty 5

## 2017-04-14 MED ORDER — PALONOSETRON HCL INJECTION 0.25 MG/5ML
0.2500 mg | Freq: Once | INTRAVENOUS | Status: AC
  Administered 2017-04-14: 0.25 mg via INTRAVENOUS

## 2017-04-14 MED ORDER — SODIUM CHLORIDE 0.9 % IV SOLN
1.8000 mg/kg | Freq: Once | INTRAVENOUS | Status: AC
  Administered 2017-04-14: 135 mg via INTRAVENOUS
  Filled 2017-04-14: qty 27

## 2017-04-14 MED ORDER — ACETAMINOPHEN 325 MG PO TABS
650.0000 mg | ORAL_TABLET | Freq: Once | ORAL | Status: AC
Start: 2017-04-14 — End: 2017-04-14
  Administered 2017-04-14: 650 mg via ORAL

## 2017-04-14 NOTE — Patient Instructions (Signed)
[facility] Discharge Instructions for Patients Receiving Chemotherapy  Today you received the following chemotherapy agents Adcentris and Bendaka/Treanda  To help prevent nausea and vomiting after your treatment, we encourage you to take your nausea medication as directed. No Zofran for 3 days. Take Compazine instead.    If you develop nausea and vomiting that is not controlled by your nausea medication, call the clinic.   BELOW ARE SYMPTOMS THAT SHOULD BE REPORTED IMMEDIATELY:  *FEVER GREATER THAN 100.5 F  *CHILLS WITH OR WITHOUT FEVER  NAUSEA AND VOMITING THAT IS NOT CONTROLLED WITH YOUR NAUSEA MEDICATION  *UNUSUAL SHORTNESS OF BREATH  *UNUSUAL BRUISING OR BLEEDING  TENDERNESS IN MOUTH AND THROAT WITH OR WITHOUT PRESENCE OF ULCERS  *URINARY PROBLEMS  *BOWEL PROBLEMS  UNUSUAL RASH Items with * indicate a potential emergency and should be followed up as soon as possible.  Feel free to call the clinic you have any questions or concerns. The clinic phone number is [phone].  Please show the [place] at check-in to the Emergency Department and triage nurse.

## 2017-04-14 NOTE — Patient Instructions (Signed)
Thank you for choosing [facility] to provide your oncology and hematology care.  To afford each patient quality time with our providers, please arrive 30 minutes before your scheduled appointment time.  If you arrive late for your appointment, you may be asked to reschedule.  We strive to give you quality time with our providers, and arriving late affects you and other patients whose appointments are after yours.  If you are a no show for multiple scheduled visits, you may be dismissed from the clinic at the providers discretion.   Again, thank you for choosing [facility], our hope is that these requests will decrease the amount of time that you wait before being seen by our physicians.  ______________________________________________________________________ Should you have questions after your visit to the  Cancer Center, please contact our office at [phone] between the hours of 8:30 and 4:30 p.m.    Voicemails left after 4:30p.m will not be returned until the following business day.   For prescription refill requests, please have your pharmacy contact us directly.  Please also try to allow 48 hours for prescription requests.   Please contact the scheduling department for questions regarding scheduling.  For scheduling of procedures such as PET scans, CT scans, MRI, Ultrasound, etc please contact central scheduling at [phone].   Resources For Cancer Patients and Caregivers:  American Cancer Society:  800-227-2345  Can help patients locate various types of support and financial assistance Cancer Care: 1-800-813-HOPE (4673) Provides financial assistance, online support groups, medication/co-pay assistance.   [place] DSS:  336-641-3447 Where to apply for food stamps, Medicaid, and utility assistance Medicare Rights Center: 800-333-4114 Helps people with Medicare understand their rights and benefits, navigate the Medicare system, and secure the  quality healthcare they deserve SCAT: 336-333-6589 [place] [employer]'s shared-ride transportation service for eligible riders who have a disability that prevents them from riding the fixed route bus.   For additional information on assistance programs please contact our social worker:   Grier Hock/Abigail Elmore:  [phone] 

## 2017-04-14 NOTE — Telephone Encounter (Signed)
No pre-auth required per Nicole[facility]. Echo scheduled for 04/19/17 @ 10 @ [facility]. Patient was given a copyof the AVS report and appointment schedule per 04/14/17 los.

## 2017-04-14 NOTE — Telephone Encounter (Signed)
Lab, follow up and chemo scheduled per 04/14/17 los. Rad/Onc appointment schedule with Dr Lisbeth Renshaw for 04/23 @ 9:30 a.m. Patient is aware.  Message sent to [facility] for Echo pre-auth. Will schedule Echo after hearing from [place].

## 2017-04-15 ENCOUNTER — Ambulatory Visit (HOSPITAL_BASED_OUTPATIENT_CLINIC_OR_DEPARTMENT_OTHER): Payer: Medicaid Other

## 2017-04-15 VITALS — BP 149/101 | HR 89 | Temp 98.2°F | Resp 18

## 2017-04-15 DIAGNOSIS — C8128 Mixed cellularity classical Hodgkin lymphoma, lymph nodes of multiple sites: Secondary | ICD-10-CM | POA: Diagnosis not present

## 2017-04-15 DIAGNOSIS — Z452 Encounter for adjustment and management of vascular access device: Secondary | ICD-10-CM

## 2017-04-15 DIAGNOSIS — Z5111 Encounter for antineoplastic chemotherapy: Secondary | ICD-10-CM

## 2017-04-15 DIAGNOSIS — I871 Compression of vein: Secondary | ICD-10-CM

## 2017-04-15 LAB — SEDIMENTATION RATE: Sedimentation Rate-Westergren: 17 mm/hr (ref 0–32)

## 2017-04-15 MED ORDER — ALTEPLASE 2 MG IJ SOLR
2.0000 mg | Freq: Once | INTRAMUSCULAR | Status: AC | PRN
  Administered 2017-04-15: 2 mg
  Filled 2017-04-15: qty 2

## 2017-04-15 MED ORDER — DEXAMETHASONE SODIUM PHOSPHATE 10 MG/ML IJ SOLN
10.0000 mg | Freq: Once | INTRAMUSCULAR | Status: AC
  Administered 2017-04-15: 10 mg via INTRAVENOUS

## 2017-04-15 MED ORDER — DIPHENHYDRAMINE HCL 50 MG/ML IJ SOLN
INTRAMUSCULAR | Status: AC
  Filled 2017-04-15: qty 1

## 2017-04-15 MED ORDER — ACETAMINOPHEN 325 MG PO TABS
ORAL_TABLET | ORAL | Status: AC
  Filled 2017-04-15: qty 2

## 2017-04-15 MED ORDER — SODIUM CHLORIDE 0.9% FLUSH
10.0000 mL | INTRAVENOUS | Status: DC | PRN
  Administered 2017-04-15: 10 mL
  Filled 2017-04-15: qty 10

## 2017-04-15 MED ORDER — HEPARIN SOD (PORK) LOCK FLUSH 100 UNIT/ML IV SOLN
500.0000 [IU] | Freq: Once | INTRAVENOUS | Status: AC | PRN
  Administered 2017-04-15: 500 [IU]
  Filled 2017-04-15: qty 5

## 2017-04-15 MED ORDER — DEXAMETHASONE SODIUM PHOSPHATE 10 MG/ML IJ SOLN
INTRAMUSCULAR | Status: AC
  Filled 2017-04-15: qty 1

## 2017-04-15 MED ORDER — ACETAMINOPHEN 325 MG PO TABS
650.0000 mg | ORAL_TABLET | Freq: Once | ORAL | Status: AC
  Administered 2017-04-15: 650 mg via ORAL

## 2017-04-15 MED ORDER — SODIUM CHLORIDE 0.9 % IV SOLN
90.0000 mg/m2 | Freq: Once | INTRAVENOUS | Status: AC
  Administered 2017-04-15: 170 mg via INTRAVENOUS
  Filled 2017-04-15: qty 34

## 2017-04-15 MED ORDER — SODIUM CHLORIDE 0.9 % IV SOLN
Freq: Once | INTRAVENOUS | Status: AC
  Administered 2017-04-15: 15:00:00 via INTRAVENOUS

## 2017-04-15 MED ORDER — DIPHENHYDRAMINE HCL 50 MG/ML IJ SOLN
50.0000 mg | Freq: Once | INTRAMUSCULAR | Status: AC
  Administered 2017-04-15: 50 mg via INTRAVENOUS

## 2017-04-15 NOTE — Progress Notes (Signed)
OK to treat with blood pressures taken today per Dr. Benay Spice.  Per Dr. Benay Spice, pt advised to see her PCP about her elevated blood pressures.  Pt voiced understanding.

## 2017-04-18 ENCOUNTER — Ambulatory Visit: Payer: Self-pay

## 2017-04-18 ENCOUNTER — Ambulatory Visit: Payer: Self-pay | Admitting: Radiation Oncology

## 2017-04-18 NOTE — Progress Notes (Signed)
[name] Kitchen  HEMATOLOGY ONCOLOGY PROGRESS NOTE  Date of service: .04/14/2017   Patient Care Team: No Pcp Per Patient as PCP - General (General Practice)  Chief complaint: Follow-up for Hodgkin's lymphoma  Diagnosis:   Refractory Mixed cellularity Hodgkin's lymphoma IVBE with extensive lymphadenopathy including right axillary, mediastinal and upper retroperitoneal and now biopsy proven pulmonary involvement. She was noted to have significant constitutional symptoms including significant weight loss, fevers chills and some night sweats.  Current Treatment:  on 2nd line therapy with Bendamustine + Brentuximab s/p 5 cycles.   Previous treatment 5 cycles of AVD (without bleomycin due to lung involvement and DLCO of 37%, active smoker) Multiple avoidable treatment delays due to the patient's noncompliance with follow-up for avoidable reasons. She has been counseled repeatedly that this would increase the likelihood of unfavorable outcome.  INTERVAL HISTORY:  Heidi [name] is here for f/u prior to her 6th cycle of Bendamustine/Brentuximab. She notes that she's been doing well. No change in breathing or worsening shortness of breath or chest pain. No palpable lymphadenopathy. No abdominal pain. We discussed her PET/CT scan results in details. She has persistent disease in a right lung. Given referral to radiation oncology. Patient notes minimal grade 1 neuropathy which mostly mostly results between treatment cycles. We discussed external importance of following up at [facility] with Dr. Duanne Moron to discuss possible transplant options. She has not set up an appointment despite multiple reminders. We called in helps scheduled a second appointment. Her transportation issues have been completely settled. No other acute new symptoms. Notes no fevers no chills no night sweats. Breathing has significantly improved and she is now ambulating without oxygen. No chest pain. Grade 1 neuropathy unchanged. No other  prohibitive toxicities.  REVIEW OF SYSTEMS:     10 Point review of systems of done and is negative except as noted above.  . Past Medical History:  Diagnosis Date  . ARDS (adult respiratory distress syndrome) ([place])   . Asthma   . Hodgkin lymphoma ([place])   . Hypertension     . Past Surgical History:  Procedure Laterality Date  . AXILLARY LYMPH NODE BIOPSY Right 03/19/2016   Procedure: AXILLARY LYMPH NODE BIOPSY;  Surgeon: Armandina Gemma, MD;  Location: [facility];  Service: General;  Laterality: Right;  . IR GENERIC HISTORICAL  12/22/2016   IR FLUORO GUIDE PORT INSERTION LEFT 12/22/2016 [facility]  . IR GENERIC HISTORICAL  12/22/2016   IR US GUIDE VASC ACCESS LEFT 12/22/2016 [facility]  . IR GENERIC HISTORICAL  12/22/2016   IR CV LINE INJECTION 12/22/2016 [facility]  . IR GENERIC HISTORICAL  12/22/2016   IR REMOVAL TUN ACCESS W/ PORT W/O FL MOD SED 12/22/2016 [facility]  . VIDEO BRONCHOSCOPY Bilateral 11/26/2016   Procedure: VIDEO BRONCHOSCOPY WITH FLUORO;  Surgeon: Rigoberto Noel, MD;  Location: [facility];  Service: Cardiopulmonary;  Laterality: Bilateral;    . Social History  Substance Use Topics  . Smoking status: Former Smoker    Packs/day: 0.50    Years: 15.00    Types: Cigarettes    Quit date: 03/27/2016  . Smokeless tobacco: Never Used  . Alcohol use Yes     Comment: occasional now    ALLERGIES:  has No Known Allergies.  MEDICATIONS:  Current Outpatient Prescriptions  Medication Sig Dispense Refill  . dexamethasone (DECADRON) 4 MG tablet 2 tab (8mg ) twice a day for 3 days starting on day of chemotherapy 30 tablet 3  . guaiFENesin (ROBITUSSIN) 100 MG/5ML SOLN Take 5  mLs (100 mg total) by mouth every 6 (six) hours as needed for cough or to loosen phlegm. 1200 mL 0  . labetalol (NORMODYNE) 200 MG tablet Take 1 tablet (200 mg total) by mouth 3 (three) times daily. 90 tablet 1  . levalbuterol (XOPENEX HFA) 45 MCG/ACT inhaler Inhale 2 puffs into the lungs every  6 (six) hours as needed for wheezing. 1 Inhaler 12  . levofloxacin (LEVAQUIN) 750 MG tablet Take 1 tablet (750 mg total) by mouth daily. 3 tablet 0  . lidocaine-prilocaine (EMLA) cream Apply 1 application topically as needed (numbing). 30 g 1  . loratadine (CLARITIN) 10 MG tablet Take 1 tablet (10 mg total) by mouth daily. For up to 5 days following Neulasta injection. 5 tablet 2  . ondansetron (ZOFRAN) 8 MG tablet Take 1 tablet (8 mg total) by mouth every 8 (eight) hours as needed for nausea (start on the 3rd day after chemotherapy). 30 tablet 3  . prochlorperazine (COMPAZINE) 10 MG tablet Take 1 tablet (10 mg total) by mouth every 6 (six) hours as needed for nausea or vomiting. 30 tablet 3  . Rivaroxaban (XARELTO) 15 MG TABS tablet Take 1 tablet (15 mg total) by mouth 2 (two) times daily with a meal. 42 tablet 0  . rivaroxaban (XARELTO) 20 MG TABS tablet Take 1 tablet (20 mg total) by mouth daily with supper. 30 tablet 1   No current facility-administered medications for this visit.    Facility-Administered Medications Ordered in Other Visits  Medication Dose Route Frequency Provider Last Rate Last Dose  . fludeoxyglucose F - 18 (FDG) injection 6.6 millicurie  6.6 millicurie Intravenous Once PRN Heidi Silvius, MD        PHYSICAL EXAMINATION: ECOG PERFORMANCE STATUS: 1 - Symptomatic but completely ambulatory  . Vitals:   04/14/17 1215  BP: (!) 149/92  Pulse: 85  Resp: 18  Temp: 98.6 F (37 C)    Filed Weights   04/14/17 1215  Weight: 131 lb 4 oz (59.5 kg)   .Body mass index is 20.56 kg/m.  OROPHARYNX: Moist mucous membranes No thrush NECK: supple, no JVD LYMPH:No palpable cervical , axillary or inguinal LNadenoapthy LUNGS:  air entry , b/l equal,  scattered rales R>L HEART: regular rate & rhythm ABDOMEN: abdomen soft, nontender, normoactive bowel sounds Extremities no edema  NEURO: Awake alert and oriented 3, moving all 4 extremities. No overt focal neurological  deficit.  LABORATORY DATA:   I have reviewed the data as listed  . CBC Latest Ref Rng & Units 04/14/2017 03/24/2017 03/03/2017  WBC 3.9 - 10.3 10e3/uL 3.7(L) 5.1 6.7  Hemoglobin 11.6 - 15.9 g/dL 10.8(L) 10.6(L) 11.4(L)  Hematocrit 34.8 - 46.6 % 32.2(L) 32.0(L) 35.4  Platelets 145 - 400 10e3/uL 264 306 409(H)   . CBC    Component Value Date/Time   WBC 3.7 (L) 04/14/2017 1118   WBC 12.5 (H) 12/22/2016 0509   RBC 3.73 04/14/2017 1118   RBC 3.44 (L) 12/22/2016 0509   HGB 10.8 (L) 04/14/2017 1118   HCT 32.2 (L) 04/14/2017 1118   PLT 264 04/14/2017 1118   MCV 86.3 04/14/2017 1118   MCH 29.0 04/14/2017 1118   MCH 30.5 12/22/2016 0509   MCHC 33.5 04/14/2017 1118   MCHC 31.7 12/22/2016 0509   RDW 17.0 (H) 04/14/2017 1118   LYMPHSABS 0.3 (L) 04/14/2017 1118   MONOABS 0.5 04/14/2017 1118   EOSABS 0.1 04/14/2017 1118   BASOSABS 0.0 04/14/2017 1118   . CMP Latest Ref Rng &  Units 04/14/2017 03/24/2017 03/03/2017  Glucose 70 - 140 mg/dl 97 80 96  BUN 7.0 - 26.0 mg/dL 5.5(L) 6.0(L) 4.3(L)  Creatinine 0.6 - 1.1 mg/dL 0.7 0.6 0.6  Sodium 136 - 145 mEq/L 142 138 138  Potassium 3.5 - 5.1 mEq/L 4.1 4.2 4.0  Chloride 101 - 111 mmol/L - - -  CO2 22 - 29 mEq/L 29 27 29   Calcium 8.4 - 10.4 mg/dL 9.4 9.1 9.7  Total Protein 6.4 - 8.3 g/dL 6.5 7.0 7.5  Total Bilirubin 0.20 - 1.20 mg/dL 0.52 0.42 0.47  Alkaline Phos 40 - 150 U/L 91 89 100  AST 5 - 34 U/L 19 16 17   ALT 0 - 55 U/L 7 7 <6    RADIOGRAPHIC STUDIES: I have personally reviewed the radiological images as listed and agreed with the findings in the report. Nm Pet Image Restag (ps) Skull Base To Thigh  Result Date: 04/14/2017 CLINICAL DATA:  Subsequent Treatment strategy for Hodgkin's lymphoma. EXAM: NUCLEAR MEDICINE PET SKULL BASE TO THIGH TECHNIQUE: 6.6 mCi F-18 FDG was injected intravenously. Full-ring PET imaging was performed from the skull base to thigh after the radiotracer. CT data was obtained and used for attenuation correction and  anatomic localization. FASTING BLOOD GLUCOSE:  Value: 60 mg/dl COMPARISON:  07/22/2016 FINDINGS: NECK No hypermetabolic lymph nodes in the neck. CHEST Moderate cardiac enlargement. Small pericardial effusion identified. New from previous exam. Dense airspace consolidation throughout the right lung is identified compatible with biopsy-proven lymphoma. There are multifocal patchy areas of hypermetabolism within this area within SUV max equal to 7.8 (Deauville criteria 5). Right paratracheal lymph node measures 11 mm within SUV max of 1.75 (Deauville criteria 2). ABDOMEN/PELVIS No abnormal hypermetabolic activity within the liver, pancreas, adrenal glands, or spleen. No hypermetabolic lymph nodes in the abdomen or pelvis. SKELETON No focal hypermetabolic activity to suggest skeletal metastasis. IMPRESSION: 1. Dense airspace consolidation with patchy areas of increased uptake in the right lung identified compatible with pathologically confirmed recurrent Hodgkin's lymphoma. Deauville criteria 5. 2. Small pericardial effusion. Electronically Signed   By: Kerby Moors M.D.   On: 04/14/2017 09:28    ASSESSMENT & PLAN:   31 year old African-American female with  #1 Refractory/Progressive Mixed cellularity Hodgkin's lymphoma IV B E with extensive lymphadenopathy including right axillary, mediastinal and upper retroperitoneal and now biopsy proven pulmonary involvement. She was noted to have significant constitutional symptoms including significant weight loss, fevers chills and some night sweats. HIV negative Hepatitis C and hepatitis B serologies negative. Echo with normal ejection fraction. Patient was treated with 5 cycles of AVD (Bleomycin held due to poor DLCO 37% and ongoing smoking). Multiple avoidable treatment delays due to the patient's noncompliance with follow-up for avoidable reasons. She has been counseled repeatedly that this would increase the likelihood of unfavorable outcome.  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 S/p 5th cycle of 2nd line treatment with Bendamustine/Brentuximab  #2 s/p hypoxic respiratory failure with dense right lung consolidation and left upper lobe consolidation with SVC syndrome. Patient has completed palliative radiation to the right lung mass causing SVC compression and has had her port removed. Is on anticoagulation with Xarelto for possible thrombus. 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  #4 Non compliance with clinic and treatment followup. Missed 2nd dose of bendamustine with C2. Has missed multiple appointment for her PET/CT and missed appointment at  [place] for consideration of Transplant and still has not re-scheduled despite multiple reminders.. Plan -lab stable. No prohibitive toxicities. 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 -PET/CT scan results from 04/14/2017 were discussed in details. She appears to have some persistent disease in her right lung at Deauville 5. It is difficult to say if this is recurrent or persistent disease since the patient failed to follow-up on multiple scheduled PET/CT scans in the early part and prior to her second line treatment. -Compared to her CT chest there appears to be some improvement. -appropriate to proceed with 6th cycle of  Bendamustine/Brentuximab-vedotin. -We will refer the patient to radiation oncology to consider possible radiation to her persistent right lung disease. -Her current cycle would be the last 1 with bendamustine. We will plan to continue Brentuximab-vedotin pending transplant evaluation at [facility]. -she has been rescheduled for appointment with Dr Dellis Filbert at [facility] -continue Xarelto at this time -She was again counseled regarding absolute smoking cessation    -patient was again strongly recommended to establish a PCP ASAP.  #5 Pericardial effusion on PET/CT - ECHO ordered to evaluate pericardial effusion noted on PET/CT scan   will complete last cycle 6th of Bendamustine/Brentuximab this cycle -Only Brentuximab for next cycle in 3 weeks  -ECHO for pericardial effusion -Radiation Oncology referral within 1 week to consider consolidative RT to residual Hodgkins lymhphoma in the rt lung. -RTC with Dr Irene Limbo in 3 weeks with labs, ECHO and rad onc input   I spent 30 minutes counseling the patient face to face. The total time spent in the appointment was 40 minutes and more than 50% was on counseling and direct patient cares.    Sullivan Lone MD [place] AAHIVMS [facility] Hematology/Oncology Physician [facility]  (Office):       [phone] (Work cell):  [phone] (Fax):           [phone]

## 2017-04-19 ENCOUNTER — Inpatient Hospital Stay (HOSPITAL_COMMUNITY): Admission: RE | Admit: 2017-04-19 | Payer: Medicaid Other | Source: Ambulatory Visit

## 2017-04-22 ENCOUNTER — Ambulatory Visit (HOSPITAL_COMMUNITY): Admission: RE | Admit: 2017-04-22 | Payer: Medicaid Other | Source: Ambulatory Visit

## 2017-04-22 ENCOUNTER — Ambulatory Visit
Admission: RE | Admit: 2017-04-22 | Discharge: 2017-04-22 | Disposition: A | Discharge status: Home / Self Care | Payer: Self-pay | Source: Ambulatory Visit | Attending: Radiation Oncology | Admitting: Radiation Oncology

## 2017-04-22 ENCOUNTER — Ambulatory Visit
Admission: RE | Admit: 2017-04-22 | Payer: Self-pay | Source: Ambulatory Visit | Attending: Radiation Oncology | Admitting: Radiation Oncology

## 2017-04-22 ENCOUNTER — Inpatient Hospital Stay
Admission: RE | Admit: 2017-04-22 | Discharge: 2017-04-22 | Disposition: A | Discharge status: Home / Self Care | Payer: Self-pay | Source: Ambulatory Visit | Attending: Radiation Oncology | Admitting: Radiation Oncology

## 2017-04-22 DIAGNOSIS — C8128 Mixed cellularity classical Hodgkin lymphoma, lymph nodes of multiple sites: Secondary | ICD-10-CM

## 2017-04-30 IMAGING — CT CT CHEST W/ CM
2 of 4 series · 14 of 36 positions shown, 17 images · IV contrast (omnipaque)
Comparison: Chest radiograph March 16, 2016

CLINICAL DATA: Shortness of Breath

EXAM:
CT CHEST WITH CONTRAST
TECHNIQUE: Multidetector CT imaging of the chest was performed during
intravenous contrast administration.
CONTRAST:  75mL OMNIPAQUE IOHEXOL 300 MG/ML  SOLN

[Series 2: chest with st · axial · 0.69mm/px · z∈[+1414,+1714]mm · 11 of 71 slices shown, 14 images]
[im 6/71  mediastinal]
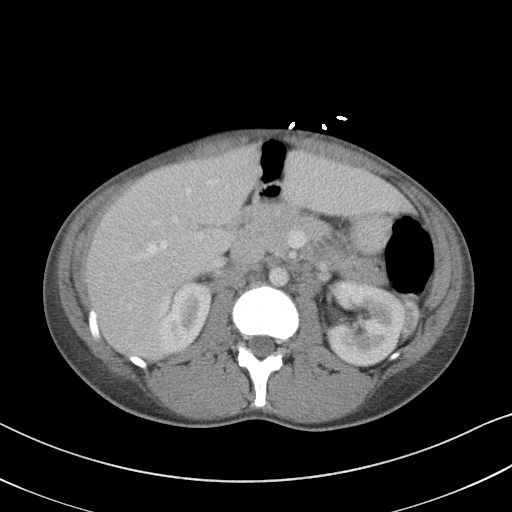
[im 6/71  lung]
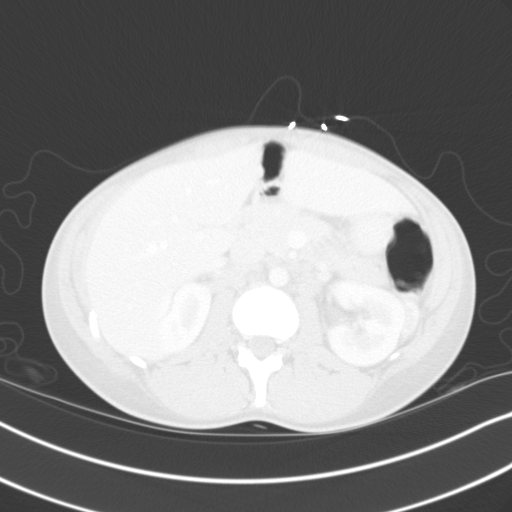
[im 11/71  lung]
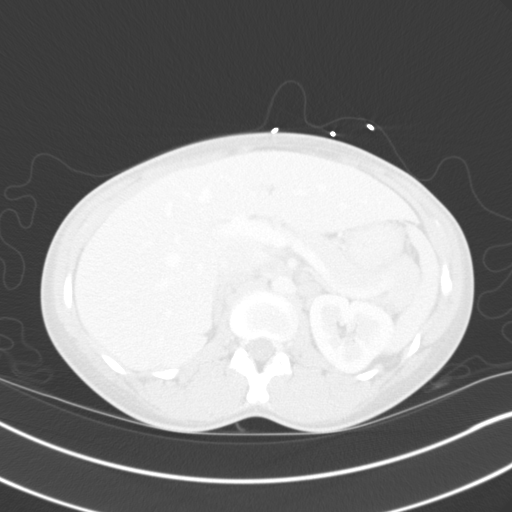
[im 16/71  lung]
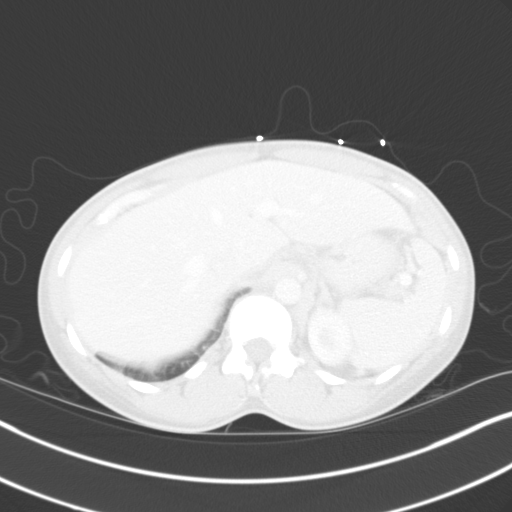
[im 26/71  lung]
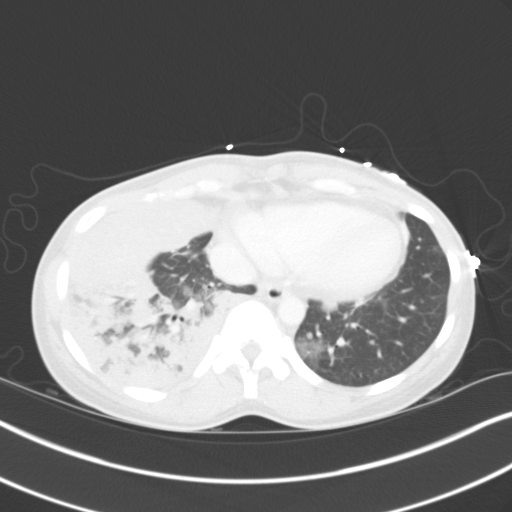
[im 31/71  mediastinal]
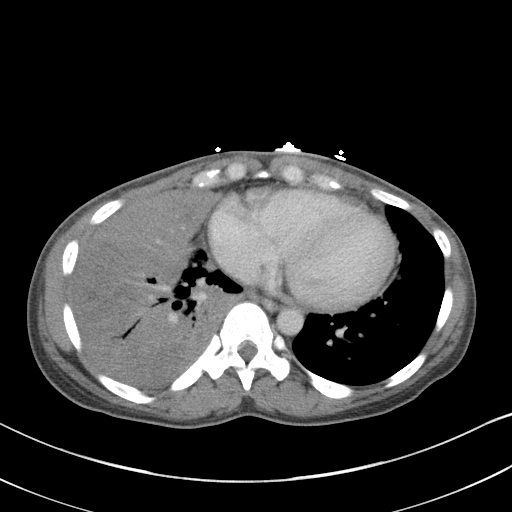
[im 31/71  lung]
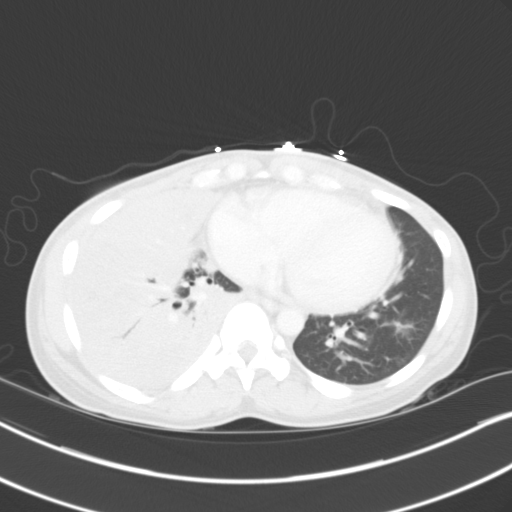
[im 36/71  lung]
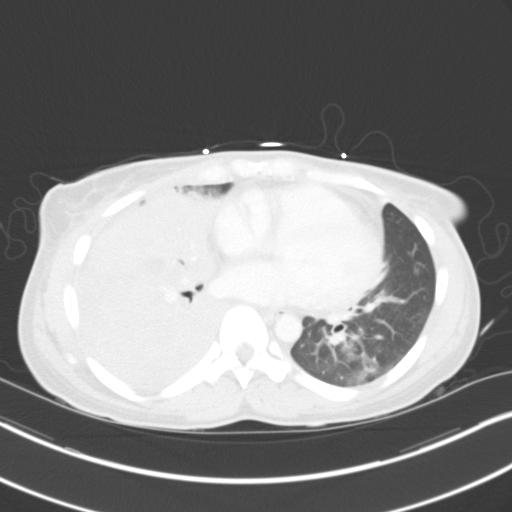
[im 41/71  lung]
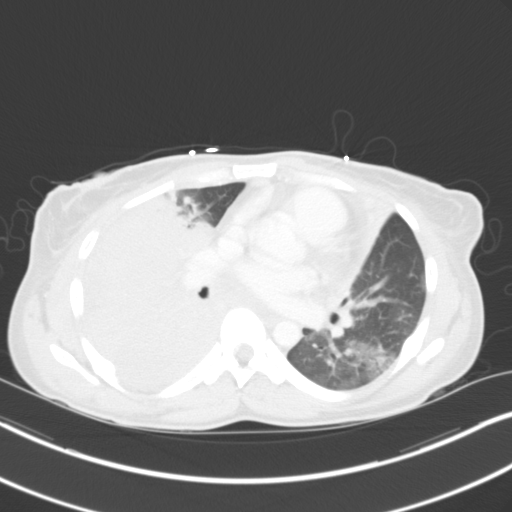
[im 46/71  lung]
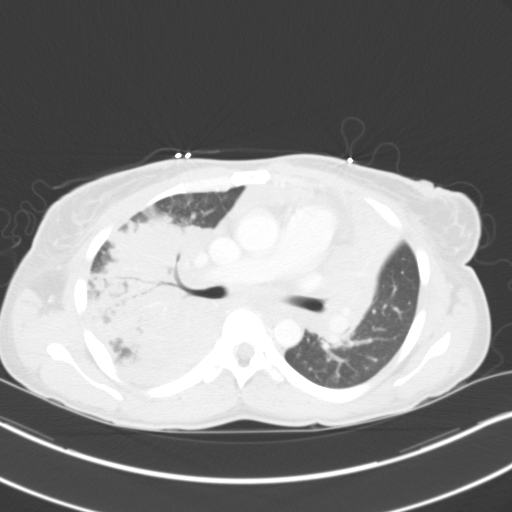
[im 56/71  mediastinal]
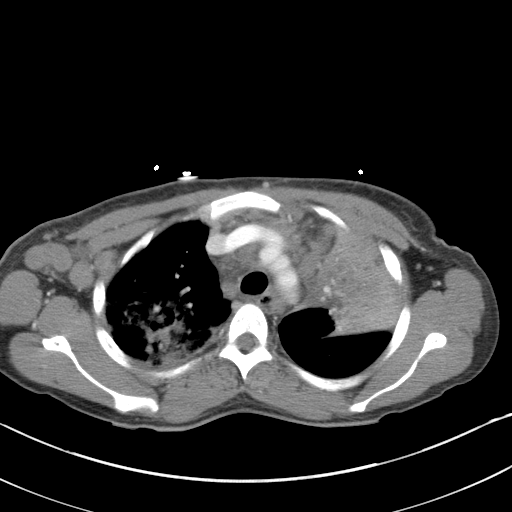
[im 56/71  lung]
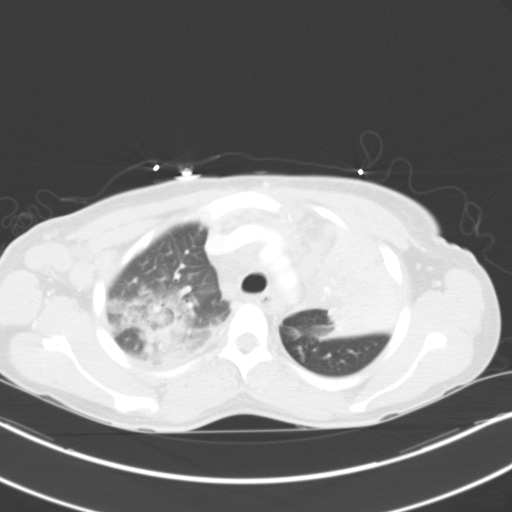
[im 61/71  lung]
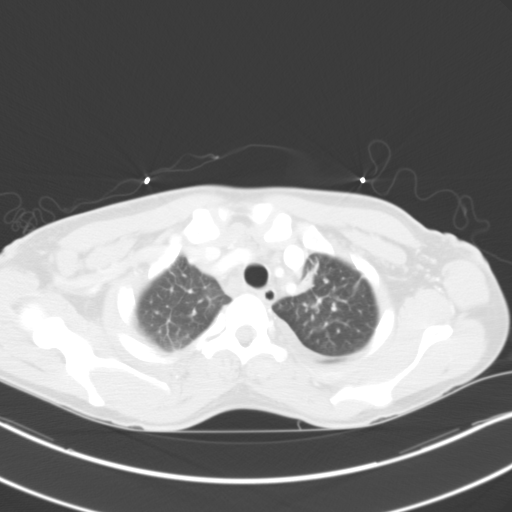
[im 66/71  lung]
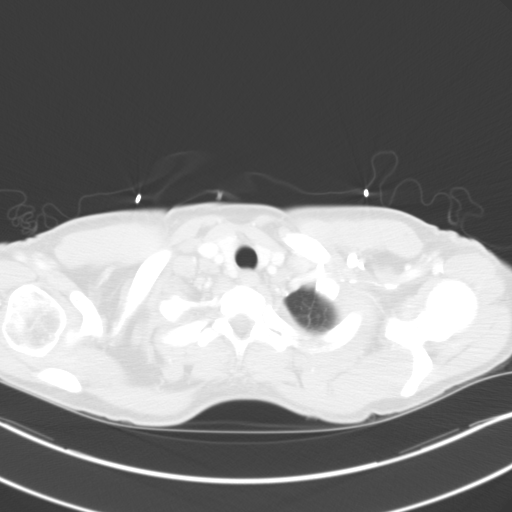

[Series 3: coronals · coronal · 0.74mm/px · 3 of 66 slices shown]
[im 14/66  lung]
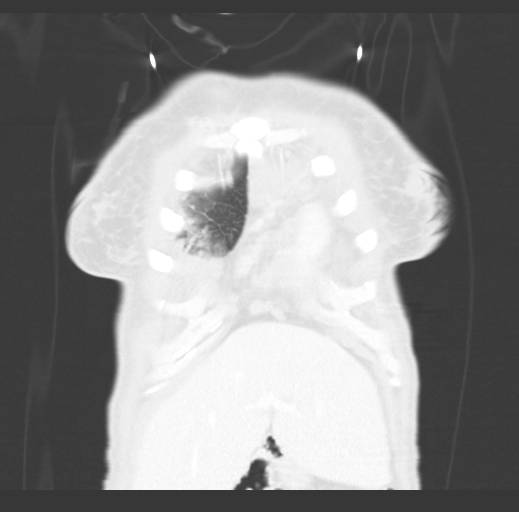
[im 27/66  lung]
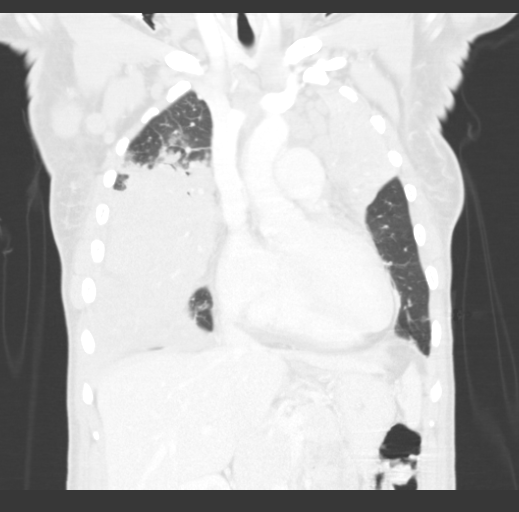
[im 40/66  lung]
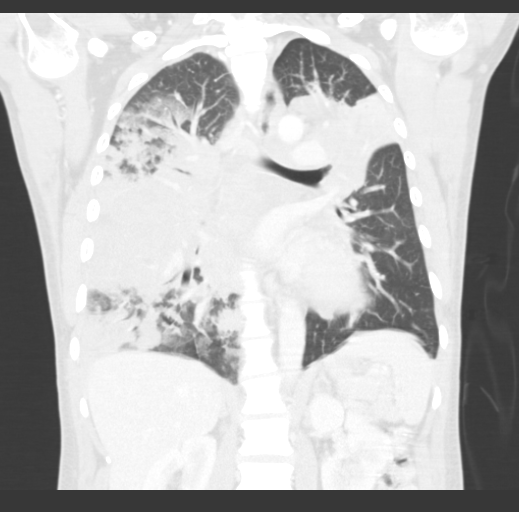

[14 of 36 positions shown; findings below may reference images not displayed]

FINDINGS: Mediastinum/Lymph Nodes: Thyroid appears unremarkable. There is
widespread adenopathy. There are enlarged lymph nodes throughout the
right axilla with the largest individual lymph node in the right
axilla measuring 3.4 x 2.6 cm. There is extensive anterior
mediastinal adenopathy. The largest individual lymph node in the
anterior mediastinum measures 2.3 x 1.5 cm. There are multiple right
peritracheal lymph nodes with the largest individual lymph node in
the right peritracheal region 9 measuring 2.0 x 1.5 cm. There are
lymph nodes tracking along the left-side of the aortic arch. A lymph
node in the aortopulmonary window region measures 2.0 x 1.3 cm.
There are lymph nodes in each hilar region. The largest lymph node
in the right hilum measures 2.3 x 1.4 cm. Lymph nodes in the left
hilum appear matched Ed there are difficult to distinguish from 1
another. There is a lymph node in the inferior left hilum measuring
1.6 x 1.3 cm. There is sub- carinal adenopathy with the largest sub-
carinal lymph node measuring 2.2 x 1.8 cm.

There is no thoracic aortic aneurysm or dissection. The visualized
great vessels appear unremarkable. There is no major vessel
pulmonary embolus evident. There is a rather minimal pericardial
effusion.

Lungs/Pleura: There is consolidation throughout essentially the
entire left upper lobe. There is abrupt termination of the proximal
left upper lobe bronchus, likely due to an endobronchial lesion in
the left upper lobe with postobstructive pneumonitis. There is
widespread airspace consolidation throughout the right upper lobe.
There is consolidation of most of the right lower lobe with
narrowing of the right lower lobe bronchus. An endobronchial lesion
on the right is not demonstrated, although there may well be
neoplasm in the right lower lobe with surrounding pneumonitis. A
lesser degree of infiltrate is noted in the right middle lobe.
Patchy pneumonia is also noted in the right lower lobe, primarily in
the posterior segment region. Lingula is collapsed along with the
remainder of the left upper lobe. There is no appreciable pleural
effusion.

Upper abdomen: In the visualized upper abdomen, there is extensive
retroperitoneal adenopathy. The largest individual lymph node
visualized is located between the aorta and left kidney measuring
2.1 x 1.3 cm. A second lymph node between the aorta and inferior
vena cava measures 2.0 x 1.2 cm. Adrenals appear unremarkable.
Visualized upper abdominal structures otherwise appear unremarkable.

Musculoskeletal: No blastic or lytic bone lesions are evident.
IMPRESSION: Extensive adenopathy throughout the hila, mediastinum, and right
axillary region as well as in the visualized upper abdominal
retroperitoneal regions. Neoplastic etiology highly likely.

Widespread pneumonitis bilaterally. There is obstruction of the left
upper lobe bronchus, likely due to tumor with postobstructive
pneumonitis. There is collapse of the right lower lobe without
well-defined endobronchial lesion. More patchy infiltrate is noted
elsewhere as noted above.

Given the findings in the lungs, bronchoscopy could be most helpful
to assess degree of potential tumor versus pneumonitis.

No adrenal lesions evident.

## 2017-05-05 ENCOUNTER — Encounter: Payer: Self-pay | Admitting: Radiation Oncology

## 2017-05-05 ENCOUNTER — Telehealth: Payer: Self-pay | Admitting: Hematology

## 2017-05-05 ENCOUNTER — Encounter: Payer: Self-pay | Admitting: Hematology

## 2017-05-05 ENCOUNTER — Other Ambulatory Visit (HOSPITAL_BASED_OUTPATIENT_CLINIC_OR_DEPARTMENT_OTHER): Payer: Medicaid Other

## 2017-05-05 ENCOUNTER — Ambulatory Visit (HOSPITAL_BASED_OUTPATIENT_CLINIC_OR_DEPARTMENT_OTHER): Payer: Medicaid Other | Admitting: Hematology

## 2017-05-05 ENCOUNTER — Ambulatory Visit (HOSPITAL_BASED_OUTPATIENT_CLINIC_OR_DEPARTMENT_OTHER): Payer: Medicaid Other

## 2017-05-05 VITALS — BP 158/105 | HR 91 | Temp 98.6°F | Resp 18 | Wt 130.1 lb

## 2017-05-05 DIAGNOSIS — C8128 Mixed cellularity classical Hodgkin lymphoma, lymph nodes of multiple sites: Secondary | ICD-10-CM

## 2017-05-05 DIAGNOSIS — G620 Drug-induced polyneuropathy: Secondary | ICD-10-CM

## 2017-05-05 DIAGNOSIS — Z5112 Encounter for antineoplastic immunotherapy: Secondary | ICD-10-CM | POA: Diagnosis present

## 2017-05-05 DIAGNOSIS — I871 Compression of vein: Secondary | ICD-10-CM

## 2017-05-05 DIAGNOSIS — C8599 Non-Hodgkin lymphoma, unspecified, extranodal and solid organ sites: Secondary | ICD-10-CM

## 2017-05-05 LAB — COMPREHENSIVE METABOLIC PANEL
ALBUMIN: 3.6 g/dL (ref 3.5–5.0)
ALK PHOS: 89 U/L (ref 40–150)
ALT: 11 U/L (ref 0–55)
ANION GAP: 9 meq/L (ref 3–11)
AST: 21 U/L (ref 5–34)
BILIRUBIN TOTAL: 0.72 mg/dL (ref 0.20–1.20)
BUN: 4.6 mg/dL — ABNORMAL LOW (ref 7.0–26.0)
CO2: 31 mEq/L — ABNORMAL HIGH (ref 22–29)
Calcium: 9.7 mg/dL (ref 8.4–10.4)
Chloride: 105 mEq/L (ref 98–109)
Creatinine: 0.7 mg/dL (ref 0.6–1.1)
EGFR: >90 mL/min/{1.73_m2} (ref >90)
GLUCOSE: 80 mg/dL (ref 70–140)
POTASSIUM: 4.2 meq/L (ref 3.5–5.1)
Sodium: 145 mEq/L (ref 136–145)
TOTAL PROTEIN: 6.9 g/dL (ref 6.4–8.3)

## 2017-05-05 LAB — TECHNOLOGIST REVIEW: Technologist Review: 7

## 2017-05-05 LAB — CBC & DIFF AND RETIC
BASO%: 0 % (ref 0.0–2.0)
Basophils Absolute: 0 10*3/uL (ref 0.0–0.1)
EOS%: 1.9 % (ref 0.0–7.0)
Eosinophils Absolute: 0 10*3/uL (ref 0.0–0.5)
HCT: 32.5 % — ABNORMAL LOW (ref 34.8–46.6)
HGB: 10.9 g/dL — ABNORMAL LOW (ref 11.6–15.9)
Immature Retic Fract: 5.1 % (ref 1.60–10.00)
LYMPH#: 0.3 10*3/uL — AB (ref 0.9–3.3)
LYMPH%: 14.7 % (ref 14.0–49.7)
MCH: 29.2 pg (ref 25.1–34.0)
MCHC: 33.5 g/dL (ref 31.5–36.0)
MCV: 87.1 fL (ref 79.5–101.0)
MONO#: 0.4 10*3/uL (ref 0.1–0.9)
MONO%: 19 % — AB (ref 0.0–14.0)
NEUT%: 64.4 % (ref 38.4–76.8)
NEUTROS ABS: 1.4 10*3/uL — AB (ref 1.5–6.5)
Platelets: 219 10*3/uL (ref 145–400)
RBC: 3.73 10*6/uL (ref 3.70–5.45)
RDW: 16.7 % — AB (ref 11.2–14.5)
RETIC %: 1.86 % (ref 0.70–2.10)
RETIC CT ABS: 69.38 10*3/uL (ref 33.70–90.70)
WBC: 2.1 10*3/uL — AB (ref 3.9–10.3)

## 2017-05-05 MED ORDER — SODIUM CHLORIDE 0.9% FLUSH
10.0000 mL | INTRAVENOUS | Status: DC | PRN
  Administered 2017-05-05: 10 mL
  Filled 2017-05-05: qty 10

## 2017-05-05 MED ORDER — ACETAMINOPHEN 325 MG PO TABS
650.0000 mg | ORAL_TABLET | Freq: Once | ORAL | Status: AC
  Administered 2017-05-05: 650 mg via ORAL

## 2017-05-05 MED ORDER — DEXAMETHASONE SODIUM PHOSPHATE 10 MG/ML IJ SOLN
INTRAMUSCULAR | Status: AC
  Filled 2017-05-05: qty 1

## 2017-05-05 MED ORDER — DIPHENHYDRAMINE HCL 50 MG/ML IJ SOLN
50.0000 mg | Freq: Once | INTRAMUSCULAR | Status: AC
  Administered 2017-05-05: 50 mg via INTRAVENOUS

## 2017-05-05 MED ORDER — SODIUM CHLORIDE 0.9 % IV SOLN
Freq: Once | INTRAVENOUS | Status: AC
Start: 2017-05-05 — End: 2017-05-05
  Administered 2017-05-05: 15:00:00 via INTRAVENOUS

## 2017-05-05 MED ORDER — BRENTUXIMAB VEDOTIN 50 MG IV SOLR
70.0000 mg | Freq: Once | INTRAVENOUS | Status: AC
  Administered 2017-05-05: 70 mg via INTRAVENOUS
  Filled 2017-05-05: qty 14

## 2017-05-05 MED ORDER — PALONOSETRON HCL INJECTION 0.25 MG/5ML
INTRAVENOUS | Status: AC
  Filled 2017-05-05: qty 5

## 2017-05-05 MED ORDER — DEXAMETHASONE SODIUM PHOSPHATE 10 MG/ML IJ SOLN
10.0000 mg | Freq: Once | INTRAMUSCULAR | Status: AC
  Administered 2017-05-05: 10 mg via INTRAVENOUS

## 2017-05-05 MED ORDER — DIPHENHYDRAMINE HCL 50 MG/ML IJ SOLN
INTRAMUSCULAR | Status: AC
  Filled 2017-05-05: qty 1

## 2017-05-05 MED ORDER — PALONOSETRON HCL INJECTION 0.25 MG/5ML: 0.2500 mg | Freq: Once | INTRAVENOUS | Status: DC

## 2017-05-05 MED ORDER — HEPARIN SOD (PORK) LOCK FLUSH 100 UNIT/ML IV SOLN
500.0000 [IU] | Freq: Once | INTRAVENOUS | Status: AC | PRN
  Administered 2017-05-05: 500 [IU]
  Filled 2017-05-05: qty 5

## 2017-05-05 MED ORDER — ACETAMINOPHEN 325 MG PO TABS
ORAL_TABLET | ORAL | Status: AC
  Filled 2017-05-05: qty 2

## 2017-05-05 NOTE — Patient Instructions (Signed)
Thank you for choosing [facility] to provide your oncology and hematology care.  To afford each patient quality time with our providers, please arrive 30 minutes before your scheduled appointment time.  If you arrive late for your appointment, you may be asked to reschedule.  We strive to give you quality time with our providers, and arriving late affects you and other patients whose appointments are after yours.   If you are a no show for multiple scheduled visits, you may be dismissed from the clinic at the providers discretion.    Again, thank you for choosing [facility], our hope is that these requests will decrease the amount of time that you wait before being seen by our physicians.  ______________________________________________________________________  Should you have questions after your visit to the [facility], please contact our office at [phone] between the hours of 8:30 and 4:30 p.m.    Voicemails left after 4:30p.m will not be returned until the following business day.    For prescription refill requests, please have your pharmacy contact us directly.  Please also try to allow 48 hours for prescription requests.    Please contact the scheduling department for questions regarding scheduling.  For scheduling of procedures such as PET scans, CT scans, MRI, Ultrasound, etc please contact central scheduling at [phone].    Resources For Cancer Patients and Caregivers:   Oncolink.org:  A wonderful resource for patients and healthcare providers for information regarding your disease, ways to tract your treatment, what to expect, etc.     American Cancer Society:  800-227-2345  Can help patients locate various types of support and financial assistance  Cancer Care: 1-800-813-HOPE (4673) Provides financial assistance, online support groups, medication/co-pay assistance.    [place] DSS:  336-641-3447 Where to apply for food  stamps, Medicaid, and utility assistance  Medicare Rights Center: 800-333-4114 Helps people with Medicare understand their rights and benefits, navigate the Medicare system, and secure the quality healthcare they deserve  SCAT: 336-333-6589 [place] [employer]'s shared-ride transportation service for eligible riders who have a disability that prevents them from riding the fixed route bus.    For additional information on assistance programs please contact our social worker:   Grier Hock/Abigail Elmore:  [phone]            

## 2017-05-05 NOTE — Progress Notes (Signed)
Per MD ok to treat with ANC of 1.4  Wylene Simmer, BSN, RN 05/05/2017 2:54 PM

## 2017-05-05 NOTE — Patient Instructions (Signed)
[employer] Discharge Instructions for Patients Receiving Chemotherapy  Today you received the following chemotherapy agents: Brentuximab    To help prevent nausea and vomiting after your treatment, we encourage you to take your nausea medication as directed.  If you develop nausea and vomiting that is not controlled by your nausea medication, call the clinic.   BELOW ARE SYMPTOMS THAT SHOULD BE REPORTED IMMEDIATELY:  *FEVER GREATER THAN 100.5 F  *CHILLS WITH OR WITHOUT FEVER  NAUSEA AND VOMITING THAT IS NOT CONTROLLED WITH YOUR NAUSEA MEDICATION  *UNUSUAL SHORTNESS OF BREATH  *UNUSUAL BRUISING OR BLEEDING  TENDERNESS IN MOUTH AND THROAT WITH OR WITHOUT PRESENCE OF ULCERS  *URINARY PROBLEMS  *BOWEL PROBLEMS  UNUSUAL RASH Items with * indicate a potential emergency and should be followed up as soon as possible.  Feel free to call the clinic you have any questions or concerns. The clinic phone number is [phone].  Please show the [place] at check-in to the Emergency Department and triage nurse.

## 2017-05-05 NOTE — Telephone Encounter (Signed)
Scheduled appt per 5/10 los - Heidi [name] scheduled consult per referral for radiation oncology. Gave patient AVS and calender per 5/10.

## 2017-05-06 LAB — SEDIMENTATION RATE: Sedimentation Rate-Westergren: 34 mm/hr — ABNORMAL HIGH (ref 0–32)

## 2017-05-07 NOTE — Progress Notes (Signed)
[name] Kitchen  HEMATOLOGY ONCOLOGY PROGRESS NOTE  Date of service: .05/05/2017   Patient Care Team: Patient, No Pcp Per as PCP - General (General Practice)  Chief complaint: Follow-up for Hodgkin's lymphoma  Diagnosis:   Refractory Mixed cellularity Hodgkin's lymphoma IVBE with extensive lymphadenopathy including right axillary, mediastinal and upper retroperitoneal and now biopsy proven pulmonary involvement. She was noted to have significant constitutional symptoms including significant weight loss, fevers chills and some night sweats.  Current Treatment:  on 2nd line therapy with Bendamustine + Brentuximab s/p 5 cycles.   Previous treatment 5 cycles of AVD (without bleomycin due to lung involvement and DLCO of 37%, active smoker) Multiple avoidable treatment delays due to the patient's noncompliance with follow-up for avoidable reasons. She has been counseled repeatedly that this would increase the likelihood of unfavorable outcome.  INTERVAL HISTORY:  Ms Lewers is here for f/u after completing 6cycles of Bendamustine/Brentuximab. She notes that she's been doing well. No chest pain or shortness of breath. Has'nt yet scheduled her radiation oncology appointment multiple scheduling attempts. Has an appointment at [facility] for transplant evaluation with Dr Duanne Moron on Tuesday.    No change in breathing or worsening shortness of breath or chest pain. No palpable lymphadenopathy. No abdominal pain. Patient notes grade 1-2 neuropathy somewhat more persistent from the last treatment. We shall reduce her dose of Brentuximab/Vedotin to 1.2mg /kg Her transportation issues were again addressed today to make sure she goes for her appointment to [facility] on Tuesday. No other acute new symptoms. Notes no fevers no chills no night sweats. Breathing has significantly improved and she is now ambulating without oxygen.   REVIEW OF SYSTEMS:     10 Point review of systems of done and is  negative except as noted above.  . Past Medical History:  Diagnosis Date  . ARDS (adult respiratory distress syndrome) ([place])   . Asthma   . Hodgkin lymphoma ([place])   . Hypertension     . Past Surgical History:  Procedure Laterality Date  . AXILLARY LYMPH NODE BIOPSY Right 03/19/2016   Procedure: AXILLARY LYMPH NODE BIOPSY;  Surgeon: Armandina Gemma, MD;  Location: [facility];  Service: General;  Laterality: Right;  . IR GENERIC HISTORICAL  12/22/2016   IR FLUORO GUIDE PORT INSERTION LEFT 12/22/2016 [facility]  . IR GENERIC HISTORICAL  12/22/2016   IR US GUIDE VASC ACCESS LEFT 12/22/2016 [facility]  . IR GENERIC HISTORICAL  12/22/2016   IR CV LINE INJECTION 12/22/2016 [facility]  . IR GENERIC HISTORICAL  12/22/2016   IR REMOVAL TUN ACCESS W/ PORT W/O FL MOD SED 12/22/2016 [facility]  . VIDEO BRONCHOSCOPY Bilateral 11/26/2016   Procedure: VIDEO BRONCHOSCOPY WITH FLUORO;  Surgeon: Rigoberto Noel, MD;  [facility];  Service: Cardiopulmonary;  Laterality: Bilateral;    . Social History  Substance Use Topics  . Smoking status: Former Smoker    Packs/day: 0.50    Years: 15.00    Types: Cigarettes    Quit date: 03/27/2016  . Smokeless tobacco: Never Used  . Alcohol use Yes     Comment: occasional now    ALLERGIES:  has No Known Allergies.  MEDICATIONS:  Current Outpatient Prescriptions  Medication Sig Dispense Refill  . dexamethasone (DECADRON) 4 MG tablet 2 tab (8mg ) twice a day for 3 days starting on day of chemotherapy 30 tablet 3  . guaiFENesin (ROBITUSSIN) 100 MG/5ML SOLN Take 5 mLs (100 mg total) by mouth every 6 (six) hours as needed for  cough or to loosen phlegm. 1200 mL 0  . labetalol (NORMODYNE) 200 MG tablet Take 1 tablet (200 mg total) by mouth 3 (three) times daily. 90 tablet 1  . levalbuterol (XOPENEX HFA) 45 MCG/ACT inhaler Inhale 2 puffs into the lungs every 6 (six) hours as needed for wheezing. 1 Inhaler 12  . levofloxacin (LEVAQUIN) 750 MG tablet  Take 1 tablet (750 mg total) by mouth daily. 3 tablet 0  . lidocaine-prilocaine (EMLA) cream Apply 1 application topically as needed (numbing). 30 g 1  . loratadine (CLARITIN) 10 MG tablet Take 1 tablet (10 mg total) by mouth daily. For up to 5 days following Neulasta injection. 5 tablet 2  . ondansetron (ZOFRAN) 8 MG tablet Take 1 tablet (8 mg total) by mouth every 8 (eight) hours as needed for nausea (start on the 3rd day after chemotherapy). 30 tablet 3  . prochlorperazine (COMPAZINE) 10 MG tablet Take 1 tablet (10 mg total) by mouth every 6 (six) hours as needed for nausea or vomiting. 30 tablet 3  . rivaroxaban (XARELTO) 20 MG TABS tablet Take 1 tablet (20 mg total) by mouth daily with supper. 30 tablet 1   No current facility-administered medications for this visit.     PHYSICAL EXAMINATION: ECOG PERFORMANCE STATUS: 1 - Symptomatic but completely ambulatory  . Vitals:   05/05/17 1328  BP: (!) 158/105  Pulse: 91  Resp: 18  Temp: 98.6 F (37 C)    Filed Weights   05/05/17 1328  Weight: 130 lb 2 oz (59 kg)   .Body mass index is 20.38 kg/m.  OROPHARYNX: Moist mucous membranes No thrush NECK: supple, no JVD LYMPH:No palpable cervical , axillary or inguinal LNadenoapthy LUNGS:  air entry , b/l equal,  scattered rales R>L HEART: regular rate & rhythm ABDOMEN: abdomen soft, nontender, normoactive bowel sounds Extremities no edema  NEURO: Awake alert and oriented 3, moving all 4 extremities. No overt focal neurological deficit.  LABORATORY DATA:   I have reviewed the data as listed  . CBC Latest Ref Rng & Units 05/05/2017 04/14/2017 03/24/2017  WBC 3.9 - 10.3 10e3/uL 2.1(L) 3.7(L) 5.1  Hemoglobin 11.6 - 15.9 g/dL 10.9(L) 10.8(L) 10.6(L)  Hematocrit 34.8 - 46.6 % 32.5(L) 32.2(L) 32.0(L)  Platelets 145 - 400 10e3/uL 219 264 306   . CBC    Component Value Date/Time   WBC 2.1 (L) 05/05/2017 1310   WBC 12.5 (H) 12/22/2016 0509   RBC 3.73 05/05/2017 1310   RBC 3.44 (L)  12/22/2016 0509   HGB 10.9 (L) 05/05/2017 1310   HCT 32.5 (L) 05/05/2017 1310   PLT 219 05/05/2017 1310   MCV 87.1 05/05/2017 1310   MCH 29.2 05/05/2017 1310   MCH 30.5 12/22/2016 0509   MCHC 33.5 05/05/2017 1310   MCHC 31.7 12/22/2016 0509   RDW 16.7 (H) 05/05/2017 1310   LYMPHSABS 0.3 (L) 05/05/2017 1310   MONOABS 0.4 05/05/2017 1310   EOSABS 0.0 05/05/2017 1310   BASOSABS 0.0 05/05/2017 1310   . CMP Latest Ref Rng & Units 05/05/2017 04/14/2017 03/24/2017  Glucose 70 - 140 mg/dl 80 97 80  BUN 7.0 - 26.0 mg/dL 4.6(L) 5.5(L) 6.0(L)  Creatinine 0.6 - 1.1 mg/dL 0.7 0.7 0.6  Sodium 136 - 145 mEq/L 145 142 138  Potassium 3.5 - 5.1 mEq/L 4.2 4.1 4.2  Chloride 101 - 111 mmol/L - - -  CO2 22 - 29 mEq/L 31(H) 29 27  Calcium 8.4 - 10.4 mg/dL 9.7 9.4 9.1  Total Protein 6.4 -  8.3 g/dL 6.9 6.5 7.0  Total Bilirubin 0.20 - 1.20 mg/dL 0.72 0.52 0.42  Alkaline Phos 40 - 150 U/L 89 91 89  AST 5 - 34 U/L 21 19 16   ALT 0 - 55 U/L 11 7 7     RADIOGRAPHIC STUDIES: I have personally reviewed the radiological images as listed and agreed with the findings in the report. Nm Pet Image Restag (ps) Skull Base To Thigh  Result Date: 04/14/2017 CLINICAL DATA:  Subsequent Treatment strategy for Hodgkin's lymphoma. EXAM: NUCLEAR MEDICINE PET SKULL BASE TO THIGH TECHNIQUE: 6.6 mCi F-18 FDG was injected intravenously. Full-ring PET imaging was performed from the skull base to thigh after the radiotracer. CT data was obtained and used for attenuation correction and anatomic localization. FASTING BLOOD GLUCOSE:  Value: 60 mg/dl COMPARISON:  07/22/2016 FINDINGS: NECK No hypermetabolic lymph nodes in the neck. CHEST Moderate cardiac enlargement. Small pericardial effusion identified. New from previous exam. Dense airspace consolidation throughout the right lung is identified compatible with biopsy-proven lymphoma. There are multifocal patchy areas of hypermetabolism within this area within SUV max equal to 7.8 (Deauville  criteria 5). Right paratracheal lymph node measures 11 mm within SUV max of 1.75 (Deauville criteria 2). ABDOMEN/PELVIS No abnormal hypermetabolic activity within the liver, pancreas, adrenal glands, or spleen. No hypermetabolic lymph nodes in the abdomen or pelvis. SKELETON No focal hypermetabolic activity to suggest skeletal metastasis. IMPRESSION: 1. Dense airspace consolidation with patchy areas of increased uptake in the right lung identified compatible with pathologically confirmed recurrent Hodgkin's lymphoma. Deauville criteria 5. 2. Small pericardial effusion. Electronically Signed   By: Kerby Moors M.D.   On: 04/14/2017 09:28    ASSESSMENT & PLAN:   31 year old African-American female with  #1 Refractory/Progressive Mixed cellularity Hodgkin's lymphoma IV B E with extensive lymphadenopathy including right axillary, mediastinal and upper retroperitoneal and now biopsy proven pulmonary involvement. She was noted to have significant constitutional symptoms including significant weight loss, fevers chills and some night sweats. HIV negative Hepatitis C and hepatitis B serologies negative. Echo with normal ejection fraction. Patient was treated with 5 cycles of AVD (Bleomycin held due to poor DLCO 37% and ongoing smoking). Multiple avoidable treatment delays due to the patient's noncompliance with follow-up for avoidable reasons. She has been counseled repeatedly that this would increase the likelihood of unfavorable outcome.  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 S/p 6 cycles of 2nd line treatment with Bendamustine/Brentuximab  -PET/CT scan results from 04/14/2017 were discussed in details. She appears to have some persistent disease in her right lung at Deauville 5. It is difficult to say if this is recurrent or persistent disease since the patient failed to follow-up on multiple scheduled PET/CT scans in the early part and prior to her second line  treatment.  #2 s/p hypoxic respiratory failure with dense right lung consolidation and left upper lobe consolidation with SVC syndrome. Patient has completed palliative radiation to the right lung mass causing SVC compression and has had her port removed. Is on anticoagulation with Xarelto for possible thrombus. 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  #4 Non compliance with clinic and treatment followup. Missed 2nd dose of bendamustine with C2. Has missed multiple appointment for her PET/CT and missed appointment at [facility] for consideration of Transplant and still has not re-scheduled despite multiple reminders..  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 No prohibitive toxicities other than Gr 1-2 neuropathy -will decrease dose of Brentuximab to 1.2mg /kg q3 weeks -She has been referred to radiation oncology to consider possible radiation to her persistent right lung disease but has been delaying this evaluation. Encourage to schedule this ASAP -We will plan to continue Brentuximab-vedotin pending transplant evaluation at [facility]. -she has been rescheduled for appointment with Dr Dellis Filbert at [facility] this Tuesday. -continue Xarelto at this time. Would discontinue after 6 month if NED status. -She was again counseled regarding absolute smoking cessation  -patient was again strongly recommended to establish a PCP ASAP.  #5 Pericardial effusion on PET/CT - ECHO ordered to evaluate pericardial effusion noted on PET/CT scan -- has not had this yet. Patient counseled to have this scheduled urgently.  -Radiation oncology referral for consideration of Rt lung consolidative RT -f/u with [facility] as per transplant appointment next week -continue treatment as per orders -RTC with Dr kale in 3 weeks with labs  I spent 30 minutes  counseling the patient face to face. The total time spent in the appointment was 40 minutes and more than 50% was on counseling and direct patient cares.    Sullivan Lone MD [place] AAHIVMS [facility] Hematology/Oncology Physician [facility]  (Office):       [phone] (Work cell):  [phone] (Fax):           [phone]

## 2017-05-18 ENCOUNTER — Ambulatory Visit: Payer: Self-pay

## 2017-05-18 ENCOUNTER — Ambulatory Visit: Admission: RE | Admit: 2017-05-18 | Payer: Self-pay | Source: Ambulatory Visit | Admitting: Urology

## 2017-05-24 ENCOUNTER — Ambulatory Visit: Payer: Self-pay | Admitting: Urology

## 2017-05-24 ENCOUNTER — Encounter: Payer: Self-pay | Admitting: *Deleted

## 2017-05-24 NOTE — Progress Notes (Signed)
QI encounter 

## 2017-05-26 ENCOUNTER — Other Ambulatory Visit: Payer: Self-pay | Admitting: *Deleted

## 2017-05-26 ENCOUNTER — Encounter: Payer: Self-pay | Admitting: Hematology

## 2017-05-26 ENCOUNTER — Ambulatory Visit (HOSPITAL_BASED_OUTPATIENT_CLINIC_OR_DEPARTMENT_OTHER): Payer: Medicaid Other | Admitting: Hematology

## 2017-05-26 ENCOUNTER — Other Ambulatory Visit (HOSPITAL_BASED_OUTPATIENT_CLINIC_OR_DEPARTMENT_OTHER): Payer: Medicaid Other

## 2017-05-26 ENCOUNTER — Ambulatory Visit: Payer: Medicaid Other

## 2017-05-26 VITALS — BP 147/96 | HR 70 | Temp 98.3°F | Resp 18 | Wt 134.0 lb

## 2017-05-26 DIAGNOSIS — D702 Other drug-induced agranulocytosis: Secondary | ICD-10-CM | POA: Diagnosis not present

## 2017-05-26 DIAGNOSIS — C8128 Mixed cellularity classical Hodgkin lymphoma, lymph nodes of multiple sites: Secondary | ICD-10-CM | POA: Diagnosis present

## 2017-05-26 DIAGNOSIS — G620 Drug-induced polyneuropathy: Secondary | ICD-10-CM

## 2017-05-26 LAB — CBC & DIFF AND RETIC
BASO%: 0 % (ref 0.0–2.0)
Basophils Absolute: 0 10*3/uL (ref 0.0–0.1)
EOS ABS: 0.1 10*3/uL (ref 0.0–0.5)
EOS%: 5.2 % (ref 0.0–7.0)
HCT: 35.8 % (ref 34.8–46.6)
HEMOGLOBIN: 11.8 g/dL (ref 11.6–15.9)
Immature Retic Fract: 6.8 % (ref 1.60–10.00)
LYMPH%: 9.8 % — AB (ref 14.0–49.7)
MCH: 30.1 pg (ref 25.1–34.0)
MCHC: 33 g/dL (ref 31.5–36.0)
MCV: 91.3 fL (ref 79.5–101.0)
MONO#: 0.5 10*3/uL (ref 0.1–0.9)
MONO%: 27.5 % — AB (ref 0.0–14.0)
NEUT#: 1.1 10*3/uL — ABNORMAL LOW (ref 1.5–6.5)
NEUT%: 57.5 % (ref 38.4–76.8)
Platelets: 244 10*3/uL (ref 145–400)
RBC: 3.92 10*6/uL (ref 3.70–5.45)
RDW: 17.4 % — ABNORMAL HIGH (ref 11.2–14.5)
Retic %: 2.12 % — ABNORMAL HIGH (ref 0.70–2.10)
Retic Ct Abs: 83.1 10*3/uL (ref 33.70–90.70)
WBC: 1.9 10*3/uL — ABNORMAL LOW (ref 3.9–10.3)
lymph#: 0.2 10*3/uL — ABNORMAL LOW (ref 0.9–3.3)

## 2017-05-26 LAB — COMPREHENSIVE METABOLIC PANEL
ALBUMIN: 3.8 g/dL (ref 3.5–5.0)
ALK PHOS: 105 U/L (ref 40–150)
ALT: 8 U/L (ref 0–55)
AST: 19 U/L (ref 5–34)
Anion Gap: 8 mEq/L (ref 3–11)
BILIRUBIN TOTAL: 0.43 mg/dL (ref 0.20–1.20)
BUN: 5.1 mg/dL — AB (ref 7.0–26.0)
CALCIUM: 9.9 mg/dL (ref 8.4–10.4)
CO2: 29 mEq/L (ref 22–29)
Chloride: 104 mEq/L (ref 98–109)
Creatinine: 0.7 mg/dL (ref 0.6–1.1)
EGFR: >90 mL/min/{1.73_m2} (ref >90)
Glucose: 79 mg/dl (ref 70–140)
POTASSIUM: 4 meq/L (ref 3.5–5.1)
Sodium: 142 mEq/L (ref 136–145)
Total Protein: 6.9 g/dL (ref 6.4–8.3)

## 2017-05-26 NOTE — Progress Notes (Signed)
Heidi [name]  HEMATOLOGY ONCOLOGY PROGRESS NOTE  Date of service: .05/26/2017   Patient Care Team: Patient, No Pcp Per as PCP - General (General Practice)  Chief complaint: Follow-up for Hodgkin's lymphoma  Diagnosis:   Refractory Mixed cellularity Hodgkin's lymphoma IVBE with extensive lymphadenopathy including right axillary, mediastinal and upper retroperitoneal and now biopsy proven pulmonary involvement. She was noted to have significant constitutional symptoms including significant weight loss, fevers chills and some night sweats.  Current Treatment:  on 2nd line therapy with Bendamustine + Brentuximab s/p 7 cycles.   Previous treatment 5 cycles of AVD (without bleomycin due to lung involvement and DLCO of 37%, active smoker) Multiple avoidable treatment delays due to the patient's noncompliance with follow-up for avoidable reasons. She has been counseled repeatedly that this would increase the likelihood of unfavorable outcome.  INTERVAL HISTORY:  [name] is here for her scheduled follow-up prior next cycle of Brentuximab.  She notes that she is feeling well. No change in breathing or worsening shortness of breath or chest pain. No palpable lymphadenopathy. No abdominal pain. Patient notes grade 1 neuropathy somewhat more persistent from the last treatment. Unfortunately developing progressive neutropenia and ANC down to around 1000 today despite dose reduction with her last cycle for neuropathy. She did not follow-up for her transplant appointment for a second time at [place] [facility] with Dr. Dellis Filbert. She has an appointment with radiation oncology to consider involved site radiation therapy to her persistent disease in the right lung. We'll plan to reschedule her next cycle of of Brentuximab/Vedotin at reduced dose of 1.2mg /kg in one week if her neutropenia is improved. No other acute new symptoms. Notes no fevers no chills no night sweats. Breathing has  significantly improved and she is now ambulating without oxygen.   REVIEW OF SYSTEMS:     10 Point review of systems of done and is negative except as noted above.  . Past Medical History:  Diagnosis Date  . ARDS (adult respiratory distress syndrome) ([place])   . Asthma   . Hodgkin lymphoma ([place])   . Hypertension     . Past Surgical History:  Procedure Laterality Date  . AXILLARY LYMPH NODE BIOPSY Right 03/19/2016   Procedure: AXILLARY LYMPH NODE BIOPSY;  Surgeon: Armandina Gemma, MD;  [facility];  Service: General;  Laterality: Right;  . IR GENERIC HISTORICAL  12/22/2016   IR FLUORO GUIDE PORT INSERTION LEFT 12/22/2016 [facility]  . IR GENERIC HISTORICAL  12/22/2016   IR US GUIDE VASC ACCESS LEFT 12/22/2016 [facility]  . IR GENERIC HISTORICAL  12/22/2016   IR CV LINE INJECTION 12/22/2016 [facility]  . IR GENERIC HISTORICAL  12/22/2016   IR REMOVAL TUN ACCESS W/ PORT W/O FL MOD SED 12/22/2016 [facility]  . VIDEO BRONCHOSCOPY Bilateral 11/26/2016   Procedure: VIDEO BRONCHOSCOPY WITH FLUORO;  Surgeon: Rigoberto Noel, MD;  [facility];  Service: Cardiopulmonary;  Laterality: Bilateral;    . Social History  Substance Use Topics  . Smoking status: Former Smoker    Packs/day: 0.50    Years: 15.00    Types: Cigarettes    Quit date: 03/27/2016  . Smokeless tobacco: Never Used  . Alcohol use Yes     Comment: occasional now    ALLERGIES:  has No Known Allergies.  MEDICATIONS:  Current Outpatient Prescriptions  Medication Sig Dispense Refill  . dexamethasone (DECADRON) 4 MG tablet 2 tab (8mg ) twice a day for 3 days starting on day of chemotherapy 30  tablet 3  . guaiFENesin (ROBITUSSIN) 100 MG/5ML SOLN Take 5 mLs (100 mg total) by mouth every 6 (six) hours as needed for cough or to loosen phlegm. 1200 mL 0  . labetalol (NORMODYNE) 200 MG tablet Take 1 tablet (200 mg total) by mouth 3 (three) times daily. 90 tablet 1  . levalbuterol (XOPENEX HFA) 45 MCG/ACT  inhaler Inhale 2 puffs into the lungs every 6 (six) hours as needed for wheezing. 1 Inhaler 12  . levofloxacin (LEVAQUIN) 750 MG tablet Take 1 tablet (750 mg total) by mouth daily. 3 tablet 0  . lidocaine-prilocaine (EMLA) cream Apply 1 application topically as needed (numbing). 30 g 1  . loratadine (CLARITIN) 10 MG tablet Take 1 tablet (10 mg total) by mouth daily. For up to 5 days following Neulasta injection. 5 tablet 2  . ondansetron (ZOFRAN) 8 MG tablet Take 1 tablet (8 mg total) by mouth every 8 (eight) hours as needed for nausea (start on the 3rd day after chemotherapy). 30 tablet 3  . prochlorperazine (COMPAZINE) 10 MG tablet Take 1 tablet (10 mg total) by mouth every 6 (six) hours as needed for nausea or vomiting. 30 tablet 3  . rivaroxaban (XARELTO) 20 MG TABS tablet Take 1 tablet (20 mg total) by mouth daily with supper. 30 tablet 1   No current facility-administered medications for this visit.     PHYSICAL EXAMINATION: ECOG PERFORMANCE STATUS: 1 - Symptomatic but completely ambulatory  . Vitals:   05/26/17 1157  BP: (!) 147/96  Pulse: 70  Resp: 18  Temp: 98.3 F (36.8 C)    Filed Weights   05/26/17 1157  Weight: 134 lb (60.8 kg)   .Body mass index is 20.99 kg/m.  OROPHARYNX: Moist mucous membranes No thrush NECK: supple, no JVD LYMPH:No palpable cervical , axillary or inguinal LNadenoapthy LUNGS:  air entry , b/l equal,  scattered rales R>L HEART: regular rate & rhythm ABDOMEN: abdomen soft, nontender, normoactive bowel sounds Extremities no edema  NEURO: Awake alert and oriented 3, moving all 4 extremities. No overt focal neurological deficit.  LABORATORY DATA:   I have reviewed the data as listed  . CBC Latest Ref Rng & Units 05/26/2017 05/05/2017 04/14/2017  WBC 3.9 - 10.3 10e3/uL 1.9(L) 2.1(L) 3.7(L)  Hemoglobin 11.6 - 15.9 g/dL 11.8 10.9(L) 10.8(L)  Hematocrit 34.8 - 46.6 % 35.8 32.5(L) 32.2(L)  Platelets 145 - 400 10e3/uL 244 219 264   . CBC      Component Value Date/Time   WBC 1.9 (L) 05/26/2017 1116   WBC 12.5 (H) 12/22/2016 0509   RBC 3.92 05/26/2017 1116   RBC 3.44 (L) 12/22/2016 0509   HGB 11.8 05/26/2017 1116   HCT 35.8 05/26/2017 1116   PLT 244 05/26/2017 1116   MCV 91.3 05/26/2017 1116   MCH 30.1 05/26/2017 1116   MCH 30.5 12/22/2016 0509   MCHC 33.0 05/26/2017 1116   MCHC 31.7 12/22/2016 0509   RDW 17.4 (H) 05/26/2017 1116   LYMPHSABS 0.2 (L) 05/26/2017 1116   MONOABS 0.5 05/26/2017 1116   EOSABS 0.1 05/26/2017 1116   BASOSABS 0.0 05/26/2017 1116   . CMP Latest Ref Rng & Units 05/26/2017 05/05/2017 04/14/2017  Glucose 70 - 140 mg/dl 79 80 97  BUN 7.0 - 26.0 mg/dL 5.1(L) 4.6(L) 5.5(L)  Creatinine 0.6 - 1.1 mg/dL 0.7 0.7 0.7  Sodium 136 - 145 mEq/L 142 145 142  Potassium 3.5 - 5.1 mEq/L 4.0 4.2 4.1  Chloride 101 - 111 mmol/L - - -  CO2 22 - 29 mEq/L 29 31(H) 29  Calcium 8.4 - 10.4 mg/dL 9.9 9.7 9.4  Total Protein 6.4 - 8.3 g/dL 6.9 6.9 6.5  Total Bilirubin 0.20 - 1.20 mg/dL 0.43 0.72 0.52  Alkaline Phos 40 - 150 U/L 105 89 91  AST 5 - 34 U/L 19 21 19   ALT 0 - 55 U/L 8 11 7      RADIOGRAPHIC STUDIES: I have personally reviewed the radiological images as listed and agreed with the findings in the report. No results found.  ASSESSMENT & PLAN:   31 year old African-American female with  #1 Refractory/Progressive Mixed cellularity Hodgkin's lymphoma IV B E with extensive lymphadenopathy including right axillary, mediastinal and upper retroperitoneal and now biopsy proven pulmonary involvement. She was noted to have significant constitutional symptoms including significant weight loss, fevers chills and some night sweats. HIV negative Hepatitis C and hepatitis B serologies negative. Echo with normal ejection fraction. Patient was treated with 5 cycles of AVD (Bleomycin held due to poor DLCO 37% and ongoing smoking). Multiple avoidable treatment delays due to the patient's noncompliance with follow-up for  avoidable reasons. She has been counseled repeatedly that this would increase the likelihood of unfavorable outcome.  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 S/p 6 cycles of 2nd line treatment with Bendamustine/Brentuximab And 1 cycle of Bretuximab alone  -PET/CT scan results from 04/14/2017 were discussed in details. She appears to have some persistent disease in her right lung at Deauville 5. It is difficult to say if this is recurrent or persistent disease since the patient failed to follow-up on multiple scheduled PET/CT scans in the early part and prior to her second line treatment.  #2 s/p hypoxic respiratory failure with dense right lung consolidation and left upper lobe consolidation with SVC syndrome. Patient has completed palliative radiation to the right lung mass causing SVC compression and has had her port removed. Is on anticoagulation with Xarelto for possible thrombus. 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  #4 Non compliance with clinic and treatment followup. Missed 2nd dose of bendamustine with C2. Has missed multiple appointment for her PET/CT and missed appointment at [facility] for consideration of Transplant and still has not re-scheduled despite multiple reminders..  #4 Grade 1 neuropathy from Brentuximab-Vedotin  #5 Neutropenia related to Brentuximab-Vedotin Plan -Unfortunately developing progressive neutropenia and ANC down to around 1000 today despite dose reduction with her last cycle for neuropathy. -We'll plan to reschedule her next cycle of of Brentuximab/Vedotin at reduced dose of 1.2mg /kg in one week if her neutropenia is improved instead of today. -She did not follow-up for her transplant appointment for a second time at [facility] with Dr. Dellis Filbert and wants to hold off on  consideration of this option currently. -She has an appointment with radiation oncology to consider involved site radiation therapy to her persistent disease in the right lung. -continue Xarelto at this time. Would discontinue after 6 month if NED status. -She was again counseled regarding absolute smoking cessation  -patient was again strongly recommended to establish a PCP ASAP. -We will plan to get rpt rpt PET/CT about 3 months after previous evaluation preferably after RT if offered.  #5 Pericardial effusion on PET/CT - ECHO ordered to evaluate pericardial effusion noted on PET/CT scan -- has not had this yet. Patient counseled to have this scheduled urgently and  several occasions and has not followed up regarding this.  Reschedule treatment due today for 7 days out with rpt labs (due to neutropenia) F/u with radiation oncology as per appointment tomorrow. RTC with Dr Irene Limbo in 4 weeks with [id number]  I spent 30 minutes counseling the patient face to face. The total time spent in the appointment was 40 minutes and more than 50% was on counseling and direct patient cares.    Sullivan Lone MD [place] AAHIVMS [facility] Hematology/Oncology Physician [facility]  (Office):       [phone] (Work cell):  [phone] (Fax):           [phone]

## 2017-05-27 ENCOUNTER — Encounter: Payer: Self-pay | Admitting: *Deleted

## 2017-05-27 ENCOUNTER — Ambulatory Visit: Admission: RE | Admit: 2017-05-27 | Payer: Self-pay | Source: Ambulatory Visit | Admitting: Urology

## 2017-05-27 ENCOUNTER — Encounter: Payer: Self-pay | Admitting: Urology

## 2017-05-27 ENCOUNTER — Ambulatory Visit: Payer: Self-pay

## 2017-05-27 LAB — SEDIMENTATION RATE: Sedimentation Rate-Westergren: 10 mm/hr (ref 0–32)

## 2017-05-27 NOTE — Progress Notes (Signed)
Patient has cancelled her re-consult appointment to discuss further palliative XRT to residual Hodkins disease in the right lung x4 including today.

## 2017-05-27 NOTE — Progress Notes (Signed)
[place] Dr. Irene Limbo nurse's pod and left a message that Heidi [name] did not come for her appointment today and the next time she is her for an appointment with him could she come to see the Radiation oncology doctor the same day, per Ashlyn Bruning, P.A.

## 2017-05-30 ENCOUNTER — Telehealth: Payer: Self-pay | Admitting: Hematology

## 2017-05-30 NOTE — Telephone Encounter (Signed)
Scheduled appt per 5/31 los - patient is aware of appts added.

## 2017-06-02 ENCOUNTER — Other Ambulatory Visit: Payer: Self-pay | Admitting: *Deleted

## 2017-06-02 DIAGNOSIS — C8128 Mixed cellularity classical Hodgkin lymphoma, lymph nodes of multiple sites: Secondary | ICD-10-CM

## 2017-06-03 ENCOUNTER — Ambulatory Visit: Payer: Self-pay

## 2017-06-03 ENCOUNTER — Ambulatory Visit: Payer: Self-pay | Admitting: Urology

## 2017-06-03 ENCOUNTER — Ambulatory Visit: Payer: Medicaid Other

## 2017-06-03 ENCOUNTER — Other Ambulatory Visit: Payer: Medicaid Other

## 2017-06-10 NOTE — Progress Notes (Signed)
Lymphoma Location(s) / Histology: Refractory Mixed cellularity Hodgkin's lymphoma IVBE with extensive lymphadenopathy including right axillary, mediastinal and upper retroperitoneal and now biopsy proven pulmonary involvement.  Heidi [name] presented months ago with symptoms of: Increasing shortness of breath now she is constitutional symptoms including significant weight loss,fever chills and some night sweats.  Biopsies of  (if applicable) revealed:Pulmonary involvement and SVC syndrome  Past/Anticipated interventions by medical oncology, if any:04-14-17 PET, Dr. Irene Limbo 2nd line therapy with Bendamustine + Brentuximab s/p 7 cycles. Previous treatment 5 cycles of AVD (without bleomycin due to lung involvement and DLCO of 37%, active smoker) Multiple avoidable treatment delays due to the patient's noncompliance with follow-up for avoidable reasons. She has been counseled repeatedly that this would increase the likelihood of unfavorable outcome.   Weight changes, if any, over the past 6 months:Yes weight has gone up and down Wt Readings from Last 3 Encounters:  06/17/17 136 lb 12.8 oz (62.1 kg)  05/26/17 134 lb (60.8 kg)  05/05/17 130 lb 2 oz (59 kg)    Recurrent fevers, or drenching night sweats, if any:   SAFETY ISSUES:  Prior radiation? 12-16-16 12-23-16 chest  Pacemaker/ICD? : No  Possible current pregnancy? No  Is the patient on methotrexate? No  Current Complaints / other details: 30y.o. woman mother of three children BP 137/78   Pulse 81   Temp 98.8 F (37.1 C) (Oral)   Resp 18   Ht 5\' 7"  (1.702 m)   Wt 136 lb 12.8 oz (62.1 kg)   SpO2 100%   BMI 21.43 kg/m Right arm sitting

## 2017-06-17 ENCOUNTER — Ambulatory Visit
Admission: RE | Admit: 2017-06-17 | Discharge: 2017-06-17 | Disposition: A | Discharge status: Home / Self Care | Payer: Medicaid Other | Source: Ambulatory Visit | Attending: Radiation Oncology | Admitting: Radiation Oncology

## 2017-06-17 ENCOUNTER — Encounter: Payer: Self-pay | Admitting: Urology

## 2017-06-17 VITALS — BP 137/78 | HR 81 | Temp 98.8°F | Resp 18 | Ht 67.0 in | Wt 136.8 lb

## 2017-06-17 DIAGNOSIS — C8128 Mixed cellularity classical Hodgkin lymphoma, lymph nodes of multiple sites: Secondary | ICD-10-CM | POA: Diagnosis present

## 2017-06-21 ENCOUNTER — Encounter: Payer: Self-pay | Admitting: *Deleted

## 2017-06-21 NOTE — Progress Notes (Signed)
[place] [employer]  [employer] representative received referral from radiation oncology for transportation needs.  CSW contacted patient at home to offer support and assess for needs.  CSW reviewed transportation resources previously provided by CSW team.  Patient was familiar with resources and stated she has been using [facility] for her appointments in [place].  Transportation resources outside of [employer] are limited.  Patient is familiar with Medicaid transportation and plans to contact them regarding transportation to [place].  CSW and patient also discussed family/friends that may be available.  Patient stated her sister car had "broken down"; which is why she missed her last appointment.  Patient stated her sisters car is now fixed and she could possibly provide transportation to the appointment in [place].  Patient stated she felt positive about arranging child care for her 1 child in the home.  Patient stated a PET scan was being scheduled to determine treatment options.  Once that is complete she will proceed with arranging transportation.  CSW encouraged patient to contact CSW to assist and with other questions or concerns.          Johnnye Lana, MSW, LCSW, OSW-C Clinical Social Worker [facility] [phone]

## 2017-06-22 ENCOUNTER — Telehealth: Payer: Self-pay | Admitting: *Deleted

## 2017-06-22 NOTE — Telephone Encounter (Signed)
CALLED PATIENT TO INFORM OF PET SCAN FOR 07-04-17- ARRIVAL TIME - 6:30 AM, PT. TO BE NPO- AFTER MIDNIGHT, TEST TO BE @ [facility], LVM FOR A RETURN CALL

## 2017-06-23 ENCOUNTER — Ambulatory Visit: Payer: Medicaid Other | Admitting: Hematology

## 2017-06-23 ENCOUNTER — Telehealth: Payer: Self-pay

## 2017-06-23 ENCOUNTER — Other Ambulatory Visit: Payer: Medicaid Other

## 2017-06-23 ENCOUNTER — Ambulatory Visit: Payer: Medicaid Other

## 2017-06-23 NOTE — Telephone Encounter (Signed)
Pt unable to come to appt today because at an appt with son. Pt has history of not showing up to appt. Verified with Dr. Irene Limbo okay to reschedule. Communicated with infusion that pt will not be here for treatment today.

## 2017-06-24 ENCOUNTER — Telehealth: Payer: Self-pay | Admitting: Radiation Oncology

## 2017-06-24 NOTE — Telephone Encounter (Signed)
Per Ashlyn Bruning's request phoned patient and inquired if she rescheduled her transplant consult at [facility]. Patient states, "I didn't know I was suppose to schedule or reschedule any appointment." Confirmed the patient has no future/upcoming appointment with [facility]. Patient confirms she is otherwise doing well. Explained this RN will relay these finding to [employer] and phone her back with further directions. The patient verbalized understanding.

## 2017-06-28 ENCOUNTER — Telehealth: Payer: Self-pay | Admitting: Hematology

## 2017-06-28 NOTE — Telephone Encounter (Signed)
Scheduled appt per sch message from [facility] 6/28 - patient is aware of appt time and date,.

## 2017-07-04 ENCOUNTER — Ambulatory Visit (HOSPITAL_COMMUNITY): Payer: Medicaid Other

## 2017-07-05 ENCOUNTER — Telehealth: Payer: Self-pay | Admitting: Radiation Oncology

## 2017-07-05 NOTE — Telephone Encounter (Signed)
Phoned Dr. Assunta Curtis office at [facility]. Spoke with scheduler, Precious. She reports the patient had an appointment with Dr. Dellis Filbert on 05/10/17 but, did not show for the appointment. She explains that notes indicate calls have been made to the patient in an attempt to reschedule and a letter has been sent to her home. Relayed these findings to [employer], PA-C.

## 2017-07-05 NOTE — Telephone Encounter (Signed)
Thank you for investigating this.  Will you please call the patient with this information as well as the number to call to re-schedule the appointment at her earliest convenience.  Also, advise that we are happy to assist with arranging transportation to the appointment on the date/time that she chooses as well as assist with future appointments as needed for treatment at [facility] if transplant is offered by Dr. Dellis Filbert.  Thank you!!!!

## 2017-07-08 ENCOUNTER — Telehealth: Payer: Self-pay | Admitting: Radiation Oncology

## 2017-07-08 ENCOUNTER — Telehealth: Payer: Self-pay | Admitting: *Deleted

## 2017-07-08 NOTE — Telephone Encounter (Signed)
As requested by Freeman Caldron, PA-C phoned patient and explained she missed her 05/10/17 appointment with Dr. Dellis Filbert. Provided her with Dr. Assunta Curtis number [phone]) to reschedule. Also, provided her with my direct contact number to call back once the appointment is rescheduled so we can help her with transportation. She verbalized understanding and read both numbers back to me correctly.

## 2017-07-08 NOTE — Telephone Encounter (Signed)
Called patient to inform of Pet Scan for 07-22-17- arrival time - 10:30 am @ [facility], pt. to be NPO- after midnight, spoke with patient and she is aware of this test.

## 2017-07-20 ENCOUNTER — Other Ambulatory Visit (HOSPITAL_BASED_OUTPATIENT_CLINIC_OR_DEPARTMENT_OTHER): Payer: Medicaid Other

## 2017-07-20 ENCOUNTER — Ambulatory Visit: Payer: Medicaid Other

## 2017-07-20 ENCOUNTER — Ambulatory Visit (HOSPITAL_BASED_OUTPATIENT_CLINIC_OR_DEPARTMENT_OTHER): Payer: Medicaid Other

## 2017-07-20 ENCOUNTER — Ambulatory Visit (HOSPITAL_BASED_OUTPATIENT_CLINIC_OR_DEPARTMENT_OTHER): Payer: Medicaid Other | Admitting: Hematology

## 2017-07-20 VITALS — BP 170/106 | HR 73 | Temp 98.7°F | Resp 20 | Ht 67.0 in | Wt 139.2 lb

## 2017-07-20 VITALS — BP 155/89 | HR 69

## 2017-07-20 DIAGNOSIS — C8128 Mixed cellularity classical Hodgkin lymphoma, lymph nodes of multiple sites: Secondary | ICD-10-CM | POA: Diagnosis present

## 2017-07-20 DIAGNOSIS — Z95828 Presence of other vascular implants and grafts: Secondary | ICD-10-CM

## 2017-07-20 DIAGNOSIS — I313 Pericardial effusion (noninflammatory): Secondary | ICD-10-CM | POA: Diagnosis not present

## 2017-07-20 DIAGNOSIS — Z7901 Long term (current) use of anticoagulants: Secondary | ICD-10-CM

## 2017-07-20 DIAGNOSIS — I159 Secondary hypertension, unspecified: Secondary | ICD-10-CM

## 2017-07-20 DIAGNOSIS — Z5112 Encounter for antineoplastic immunotherapy: Secondary | ICD-10-CM

## 2017-07-20 DIAGNOSIS — C8599 Non-Hodgkin lymphoma, unspecified, extranodal and solid organ sites: Secondary | ICD-10-CM

## 2017-07-20 DIAGNOSIS — I871 Compression of vein: Secondary | ICD-10-CM

## 2017-07-20 DIAGNOSIS — I3139 Other pericardial effusion (noninflammatory): Secondary | ICD-10-CM

## 2017-07-20 DIAGNOSIS — G620 Drug-induced polyneuropathy: Secondary | ICD-10-CM

## 2017-07-20 LAB — COMPREHENSIVE METABOLIC PANEL
ALBUMIN: 3.9 g/dL (ref 3.5–5.0)
ALK PHOS: 116 U/L (ref 40–150)
ALT: 12 U/L (ref 0–55)
ANION GAP: 8 meq/L (ref 3–11)
AST: 20 U/L (ref 5–34)
BILIRUBIN TOTAL: 0.64 mg/dL (ref 0.20–1.20)
BUN: 5.1 mg/dL — ABNORMAL LOW (ref 7.0–26.0)
CALCIUM: 9.7 mg/dL (ref 8.4–10.4)
CHLORIDE: 105 meq/L (ref 98–109)
CO2: 30 mEq/L — ABNORMAL HIGH (ref 22–29)
CREATININE: 0.8 mg/dL (ref 0.6–1.1)
EGFR: >90 mL/min/{1.73_m2} (ref >90)
Glucose: 68 mg/dl — ABNORMAL LOW (ref 70–140)
Potassium: 4 mEq/L (ref 3.5–5.1)
Sodium: 143 mEq/L (ref 136–145)
Total Protein: 6.8 g/dL (ref 6.4–8.3)

## 2017-07-20 LAB — CBC WITH DIFFERENTIAL/PLATELET
BASO%: 0.4 % (ref 0.0–2.0)
BASOS ABS: 0 10*3/uL (ref 0.0–0.1)
EOS%: 4.4 % (ref 0.0–7.0)
Eosinophils Absolute: 0.2 10*3/uL (ref 0.0–0.5)
HEMATOCRIT: 35.2 % (ref 34.8–46.6)
HEMOGLOBIN: 11.7 g/dL (ref 11.6–15.9)
LYMPH#: 0.3 10*3/uL — AB (ref 0.9–3.3)
LYMPH%: 10 % — ABNORMAL LOW (ref 14.0–49.7)
MCH: 30.9 pg (ref 25.1–34.0)
MCHC: 33.1 g/dL (ref 31.5–36.0)
MCV: 93.2 fL (ref 79.5–101.0)
MONO#: 0.5 10*3/uL (ref 0.1–0.9)
MONO%: 14.3 % — ABNORMAL HIGH (ref 0.0–14.0)
NEUT#: 2.5 10*3/uL (ref 1.5–6.5)
NEUT%: 70.9 % (ref 38.4–76.8)
PLATELETS: 266 10*3/uL (ref 145–400)
RBC: 3.78 10*6/uL (ref 3.70–5.45)
RDW: 14.5 % (ref 11.2–14.5)
WBC: 3.5 10*3/uL — ABNORMAL LOW (ref 3.9–10.3)

## 2017-07-20 MED ORDER — ACETAMINOPHEN 325 MG PO TABS
ORAL_TABLET | ORAL | Status: AC
  Filled 2017-07-20: qty 2

## 2017-07-20 MED ORDER — CLONIDINE HCL 0.1 MG PO TABS
ORAL_TABLET | ORAL | Status: AC
  Filled 2017-07-20: qty 1

## 2017-07-20 MED ORDER — BRENTUXIMAB VEDOTIN 50 MG IV SOLR
1.2000 mg/kg | Freq: Once | INTRAVENOUS | Status: AC
  Administered 2017-07-20: 70 mg via INTRAVENOUS
  Filled 2017-07-20: qty 14

## 2017-07-20 MED ORDER — DIPHENHYDRAMINE HCL 50 MG/ML IJ SOLN
INTRAMUSCULAR | Status: AC
  Filled 2017-07-20: qty 1

## 2017-07-20 MED ORDER — CLONIDINE HCL 0.1 MG PO TABS
0.1000 mg | ORAL_TABLET | Freq: Once | ORAL | Status: DC | PRN
Start: 2017-07-20 — End: 2017-07-20
  Administered 2017-07-20: 0.1 mg via ORAL

## 2017-07-20 MED ORDER — SODIUM CHLORIDE 0.9 % IV SOLN
Freq: Once | INTRAVENOUS | Status: AC
  Administered 2017-07-20: 16:00:00 via INTRAVENOUS

## 2017-07-20 MED ORDER — DIPHENHYDRAMINE HCL 50 MG/ML IJ SOLN
50.0000 mg | Freq: Once | INTRAMUSCULAR | Status: AC
  Administered 2017-07-20: 50 mg via INTRAVENOUS

## 2017-07-20 MED ORDER — DEXAMETHASONE SODIUM PHOSPHATE 10 MG/ML IJ SOLN
10.0000 mg | Freq: Once | INTRAMUSCULAR | Status: AC
  Administered 2017-07-20: 10 mg via INTRAVENOUS

## 2017-07-20 MED ORDER — HEPARIN SOD (PORK) LOCK FLUSH 100 UNIT/ML IV SOLN
500.0000 [IU] | Freq: Once | INTRAVENOUS | Status: AC | PRN
  Administered 2017-07-20: 500 [IU]
  Filled 2017-07-20: qty 5

## 2017-07-20 MED ORDER — DEXAMETHASONE SODIUM PHOSPHATE 10 MG/ML IJ SOLN
INTRAMUSCULAR | Status: AC
  Filled 2017-07-20: qty 1

## 2017-07-20 MED ORDER — ACETAMINOPHEN 325 MG PO TABS
650.0000 mg | ORAL_TABLET | Freq: Once | ORAL | Status: AC
  Administered 2017-07-20: 650 mg via ORAL

## 2017-07-20 MED ORDER — SODIUM CHLORIDE 0.9% FLUSH
10.0000 mL | INTRAVENOUS | Status: DC | PRN
  Administered 2017-07-20: 10 mL
  Filled 2017-07-20: qty 10

## 2017-07-20 MED ORDER — SODIUM CHLORIDE 0.9% FLUSH
10.0000 mL | INTRAVENOUS | Status: DC | PRN
  Administered 2017-07-20: 10 mL via INTRAVENOUS
  Filled 2017-07-20: qty 10

## 2017-07-20 NOTE — Progress Notes (Signed)
B/p 180/110, per Dr. Irene Limbo pt to receive .1 clonidine and proceed with treatment. Pt received .01 clonidine.

## 2017-07-20 NOTE — Progress Notes (Signed)
Dr. Irene Limbo aware of BP 180/110. Order placed for clonidine 0.1mg  PO. RN aware of low blood sugar. Encouraged to give pt something to eat and drink. Okay to treat.

## 2017-07-20 NOTE — Patient Instructions (Signed)
[facility] Discharge Instructions for Patients Receiving Chemotherapy  Today you received the following chemotherapy agents Adcetris.  To help prevent nausea and vomiting after your treatment, we encourage you to take your nausea medication as directed.    If you develop nausea and vomiting that is not controlled by your nausea medication, call the clinic.   BELOW ARE SYMPTOMS THAT SHOULD BE REPORTED IMMEDIATELY:  *FEVER GREATER THAN 100.5 F  *CHILLS WITH OR WITHOUT FEVER  NAUSEA AND VOMITING THAT IS NOT CONTROLLED WITH YOUR NAUSEA MEDICATION  *UNUSUAL SHORTNESS OF BREATH  *UNUSUAL BRUISING OR BLEEDING  TENDERNESS IN MOUTH AND THROAT WITH OR WITHOUT PRESENCE OF ULCERS  *URINARY PROBLEMS  *BOWEL PROBLEMS  UNUSUAL RASH Items with * indicate a potential emergency and should be followed up as soon as possible.  Feel free to call the clinic you have any questions or concerns. The clinic phone number is [phone].  Please show the CHEMO ALERT CARD at check-in to the Emergency Department and triage nurse.    

## 2017-07-20 NOTE — Progress Notes (Signed)
Heidi Kitchen  HEMATOLOGY ONCOLOGY PROGRESS NOTE  Date of service: .07/20/2017   Patient Care Team: Patient, No Pcp Per as PCP - General (General Practice)  Chief complaint: Follow-up for Hodgkin's lymphoma  Diagnosis:   Refractory Mixed cellularity Hodgkin's lymphoma IVBE with extensive lymphadenopathy including right axillary, mediastinal and upper retroperitoneal and now biopsy proven pulmonary involvement. She was noted to have significant constitutional symptoms including significant weight loss, fevers chills and some night sweats.  Current Treatment:  on 2nd line therapy with Bendamustine + Brentuximab s/p 7 cycles.   Previous treatment 5 cycles of AVD (without bleomycin due to lung involvement and DLCO of 37%, active smoker) Multiple avoidable treatment delays due to the patient's noncompliance with follow-up for avoidable reasons. She has been counseled repeatedly that this would increase the likelihood of unfavorable outcome.  INTERVAL HISTORY:  Heidi [name] is here for follow-up for her Hodgkins lymphoma. She no showed for her previous clinic visit and treatment scheduled for 06/23/2017.  She has been seen by radiation oncology. She has been told on numerous occasions to reschedule her appointment with [facility] and notes that she will do this today. She has an upcoming PET/CT scan scheduled for 07/22/2017 which she was repeatedly reminded to ensure compliance.   She notes that she is feeling well. No change in breathing or worsening shortness of breath or chest pain. No palpable lymphadenopathy. No abdominal pain. She notes her neuropathy has somewhat improved Notes no fevers no chills no night sweats. Breathing has significantly improved and she is now ambulating without oxygen.  Discussed in detail the importance of compliance with follow-up and treatment.  REVIEW OF SYSTEMS:     10 Point review of systems of done and is negative except as noted above.  . Past  Medical History:  Diagnosis Date  . ARDS (adult respiratory distress syndrome) ([place])   . Asthma   . Hodgkin lymphoma ([place])   . Hypertension     . Past Surgical History:  Procedure Laterality Date  . AXILLARY LYMPH NODE BIOPSY Right 03/19/2016   Procedure: AXILLARY LYMPH NODE BIOPSY;  Surgeon: Armandina Gemma, MD;  Location: [facility];  Service: General;  Laterality: Right;  . IR GENERIC HISTORICAL  12/22/2016   IR FLUORO GUIDE PORT INSERTION LEFT 12/22/2016 [facility]  . IR GENERIC HISTORICAL  12/22/2016   IR US GUIDE VASC ACCESS LEFT 12/22/2016 [facility]  . IR GENERIC HISTORICAL  12/22/2016   IR CV LINE INJECTION 12/22/2016 [facility]  . IR GENERIC HISTORICAL  12/22/2016   IR REMOVAL TUN ACCESS W/ PORT W/O FL MOD SED 12/22/2016 [facility]  . VIDEO BRONCHOSCOPY Bilateral 11/26/2016   Procedure: VIDEO BRONCHOSCOPY WITH FLUORO;  Surgeon: Rigoberto Noel, MD;  Location: [facility];  Service: Cardiopulmonary;  Laterality: Bilateral;    . Social History  Substance Use Topics  . Smoking status: Former Smoker    Packs/day: 0[name]50    Years: 15[name]00    Types: Cigarettes    Quit date: 03/27/2016  . Smokeless tobacco: Never Used  . Alcohol use Yes     Comment: occasional now    ALLERGIES:  has No Known Allergies.  MEDICATIONS:  Current Outpatient Prescriptions  Medication Sig Dispense Refill  . dexamethasone (DECADRON) 4 MG tablet 2 tab (8mg ) twice a day for 3 days starting on day of chemotherapy (Patient not taking: Reported on 06/17/2017) 30 tablet 3  . guaiFENesin (ROBITUSSIN) 100 MG/5ML SOLN Take 5 mLs (100 mg total) by mouth every 6 (  six) hours as needed for cough or to loosen phlegm. (Patient not taking: Reported on 06/17/2017) 1200 mL 0  . labetalol (NORMODYNE) 200 MG tablet Take 1 tablet (200 mg total) by mouth 3 (three) times daily. (Patient not taking: Reported on 06/17/2017) 90 tablet 1  . levalbuterol (XOPENEX HFA) 45 MCG/ACT inhaler Inhale 2 puffs into the lungs every 6  (six) hours as needed for wheezing. (Patient not taking: Reported on 06/17/2017) 1 Inhaler 12  . lidocaine-prilocaine (EMLA) cream Apply 1 application topically as needed (numbing). 30 g 1  . loratadine (CLARITIN) 10 MG tablet Take 1 tablet (10 mg total) by mouth daily. For up to 5 days following Neulasta injection. (Patient not taking: Reported on 06/17/2017) 5 tablet 2  . ondansetron (ZOFRAN) 8 MG tablet Take 1 tablet (8 mg total) by mouth every 8 (eight) hours as needed for nausea (start on the 3rd day after chemotherapy). (Patient not taking: Reported on 06/17/2017) 30 tablet 3  . prochlorperazine (COMPAZINE) 10 MG tablet Take 1 tablet (10 mg total) by mouth every 6 (six) hours as needed for nausea or vomiting. (Patient not taking: Reported on 06/17/2017) 30 tablet 3  . rivaroxaban (XARELTO) 20 MG TABS tablet Take 1 tablet (20 mg total) by mouth daily with supper. (Patient not taking: Reported on 06/17/2017) 30 tablet 1   No current facility-administered medications for this visit.    Facility-Administered Medications Ordered in Other Visits  Medication Dose Route Frequency Provider Last Rate Last Dose  . sodium chloride flush (NS) 0[name]9 % injection 10 mL  10 mL Intravenous PRN Brunetta Genera, MD   10 mL at 07/20/17 1401    PHYSICAL EXAMINATION: ECOG PERFORMANCE STATUS: 1 - Symptomatic but completely ambulatory  . Vitals:   07/20/17 1349  BP: (!) 170/106  Pulse: 73  Resp: 20  Temp: 98[name]7 F (37[name]1 C)    Filed Weights   07/20/17 1349  Weight: 139 lb 3[name]2 oz (63[name]1 kg)   .Body mass index is 21[name]8 kg/m.  OROPHARYNX: Moist mucous membranes No thrush NECK: supple, no JVD LYMPH:No palpable cervical , axillary or inguinal LNadenoapthy LUNGS:  air entry , b/l equal,  scattered rales R>L HEART: regular rate & rhythm ABDOMEN: abdomen soft, nontender, normoactive bowel sounds Extremities no edema  NEURO: Awake alert and oriented 3, moving all 4 extremities. No overt focal neurological  deficit.  LABORATORY DATA:   I have reviewed the data as listed  . CBC Latest Ref Rng & Units 07/20/2017 05/26/2017 05/05/2017  WBC 3[name]9 - 10[name]3 10e3/uL 3[name]5(L) 1[name]9(L) 2[name]1(L)  Hemoglobin 11[name]6 - 15[name]9 g/dL 11[name]7 11[name]8 10[name]9(L)  Hematocrit 34[name]8 - 46[name]6 % 35[name]2 35[name]8 32[name]5(L)  Platelets 145 - 400 10e3/uL 266 244 219   . CBC    Component Value Date/Time   WBC 3[name]5 (L) 07/20/2017 1315   WBC 12[name]5 (H) 12/22/2016 0509   RBC 3[name]78 07/20/2017 1315   RBC 3[name]44 (L) 12/22/2016 0509   HGB 11[name]7 07/20/2017 1315   HCT 35[name]2 07/20/2017 1315   PLT 266 07/20/2017 1315   MCV 93[name]2 07/20/2017 1315   MCH 30[name]9 07/20/2017 1315   MCH 30[name]5 12/22/2016 0509   MCHC 33[name]1 07/20/2017 1315   MCHC 31[name]7 12/22/2016 0509   RDW 14[name]5 07/20/2017 1315   LYMPHSABS 0[name]3 (L) 07/20/2017 1315   MONOABS 0[name]5 07/20/2017 1315   EOSABS 0[name]2 07/20/2017 1315   BASOSABS 0[name]0 07/20/2017 1315   . CMP Latest Ref Rng & Units 05/26/2017 05/05/2017 04/14/2017  Glucose 70 - 140 mg/dl  79 80 97  BUN 7[name]0 - 26[name]0 mg/dL 5[name]1(L) 4[name]6(L) 5[name]5(L)  Creatinine 0[name]6 - 1[name]1 mg/dL 0[name]7 0[name]7 0[name]7  Sodium 136 - 145 mEq/L 142 145 142  Potassium 3[name]5 - 5[name]1 mEq/L 4[name]0 4[name]2 4[name]1  Chloride 101 - 111 mmol/L - - -  CO2 22 - 29 mEq/L 29 31(H) 29  Calcium 8[name]4 - 10[name]4 mg/dL 9[name]9 9[name]7 9[name]4  Total Protein 6[name]4 - 8[name]3 g/dL 6[name]9 6[name]9 6[name]5  Total Bilirubin 0[name]20 - 1[name]20 mg/dL 0[name]43 0[name]72 0[name]52  Alkaline Phos 40 - 150 U/L 105 89 91  AST 5 - 34 U/L 19 21 19   ALT 0 - 55 U/L 8 11 7      RADIOGRAPHIC STUDIES: I have personally reviewed the radiological images as listed and agreed with the findings in the report. No results found.  ASSESSMENT & PLAN:   31 year old African-American female with  #1 Refractory/Progressive Mixed cellularity Hodgkin's lymphoma IV B E with extensive lymphadenopathy including right axillary, mediastinal and upper retroperitoneal and now biopsy proven pulmonary involvement. She was noted to have significant constitutional symptoms including significant weight loss, fevers chills  and some night sweats. HIV negative Hepatitis C and hepatitis B serologies negative. Echo with normal ejection fraction. Patient was treated with 5 cycles of AVD (Bleomycin held due to poor DLCO 37% and ongoing smoking). Multiple avoidable treatment delays due to the patient's noncompliance with follow-up for avoidable reasons. She has been counseled repeatedly that this would increase the likelihood of unfavorable outcome.  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 S/p 6 cycles of 2nd line treatment with Bendamustine/Brentuximab And 1 cycle of Bretuximab alone  -PET/CT scan results from 04/14/2017 were discussed in details. She appears to have some persistent disease in her right lung at [place] 5. It is difficult to say if this is recurrent or persistent disease since the patient failed to follow-up on multiple scheduled PET/CT scans in the early part and prior to her second line treatment.  #2 s/p hypoxic respiratory failure with dense right lung consolidation and left upper lobe consolidation with SVC syndrome. Patient has completed palliative radiation to the right lung mass causing SVC compression and has had her port removed. Is on anticoagulation with Xarelto for possible thrombus. 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  #4 Non compliance with clinic and treatment followup. Missed 2nd dose of bendamustine with C2. Has missed multiple appointment for her PET/CT and missed appointment at [facility] for consideration of Transplant and still has not re-scheduled despite multiple reminders..  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 -PET/CT scan scheduled for 07/02/2017 -She did not follow-up for her transplant appointment for a second time at [facility] with Dr. Dellis Filbert and now has been given the contact information and transportation options and is to call and schedule her appointment today. -If patient is evaluated and And deemed to be a candidate for transplant would hold off on radiation therapy. If not a transplant candidate and her diseases well controlled on PET scan other than in the lung could consider RT. -continue Xarelto at this time. Would discontinue after 6 month if NED status. -She was again counseled regarding absolute smoking  cessation  -patient was again strongly recommended to establish a PCP ASAP.  #5 Pericardial effusion on PET/CT - ECHO ordered to evaluate pericardial effusion noted on PET/CT scan -- has not had this yet. Patient counseled to have this scheduled urgently and several occasions and has not followed up regarding this. -sent scheduling request again  PET/CT as scheduled on 07/22/2017 Plz schedule ECHO (ordered previously - still not done) Schedule next cycle of Brentuximab in 3 weeks with labs RTC with Dr Irene Limbo in 3 weeks with labs F/u with Radiation Oncology  F/u Dr Duanne Moron at [facility] ASAP  I spent 25 minutes counseling the patient face to face. The total time spent in the appointment was 40 minutes and more than 50% was on counseling and direct patient cares.    Sullivan Lone MD [place] AAHIVMS [facility] Hematology/Oncology Physician [facility]  (Office):       [phone] (Work cell):  [phone] (Fax):           [phone]

## 2017-07-20 NOTE — Patient Instructions (Signed)
Implanted Port Home Guide An implanted port is a type of central line that is placed under the skin. Central lines are used to provide IV access when treatment or nutrition needs to be given through a person's veins. Implanted ports are used for long-term IV access. An implanted port may be placed because:  You need IV medicine that would be irritating to the small veins in your hands or arms.  You need long-term IV medicines, such as antibiotics.  You need IV nutrition for a long period.  You need frequent blood draws for lab tests.  You need dialysis.  Implanted ports are usually placed in the chest area, but they can also be placed in the upper arm, the abdomen, or the leg. An implanted port has two main parts:  Reservoir. The reservoir is round and will appear as a small, raised area under your skin. The reservoir is the part where a needle is inserted to give medicines or draw blood.  Catheter. The catheter is a thin, flexible tube that extends from the reservoir. The catheter is placed into a large vein. Medicine that is inserted into the reservoir goes into the catheter and then into the vein.  How will I care for my incision site? Do not get the incision site wet. Bathe or shower as directed by your health care provider. How is my port accessed? Special steps must be taken to access the port:  Before the port is accessed, a numbing cream can be placed on the skin. This helps numb the skin over the port site.  Your health care provider uses a sterile technique to access the port. ? Your health care provider must put on a mask and sterile gloves. ? The skin over your port is cleaned carefully with an antiseptic and allowed to dry. ? The port is gently pinched between sterile gloves, and a needle is inserted into the port.  Only "non-coring" port needles should be used to access the port. Once the port is accessed, a blood return should be checked. This helps ensure that the port  is in the vein and is not clogged.  If your port needs to remain accessed for a constant infusion, a clear (transparent) bandage will be placed over the needle site. The bandage and needle will need to be changed every week, or as directed by your health care provider.  Keep the bandage covering the needle clean and dry. Do not get it wet. Follow your health care provider's instructions on how to take a shower or bath while the port is accessed.  If your port does not need to stay accessed, no bandage is needed over the port.  What is flushing? Flushing helps keep the port from getting clogged. Follow your health care provider's instructions on how and when to flush the port. Ports are usually flushed with saline solution or a medicine called heparin. The need for flushing will depend on how the port is used.  If the port is used for intermittent medicines or blood draws, the port will need to be flushed: ? After medicines have been given. ? After blood has been drawn. ? As part of routine maintenance.  If a constant infusion is running, the port may not need to be flushed.  How long will my port stay implanted? The port can stay in for as long as your health care provider thinks it is needed. When it is time for the port to come out, surgery will be   done to remove it. The procedure is similar to the one performed when the port was put in. When should I seek immediate medical care? When you have an implanted port, you should seek immediate medical care if:  You notice a bad smell coming from the incision site.  You have swelling, redness, or drainage at the incision site.  You have more swelling or pain at the port site or the surrounding area.  You have a fever that is not controlled with medicine.  This information is not intended to replace advice given to you by your health care provider. Make sure you discuss any questions you have with your health care provider. Document  Released: 12/13/2005 Document Revised: 05/20/2016 Document Reviewed: 08/20/2013 Elsevier Interactive Patient Education  2017 [employer]  

## 2017-07-21 ENCOUNTER — Other Ambulatory Visit: Payer: Self-pay | Admitting: Hematology

## 2017-07-21 DIAGNOSIS — I3139 Other pericardial effusion (noninflammatory): Secondary | ICD-10-CM

## 2017-07-21 DIAGNOSIS — I313 Pericardial effusion (noninflammatory): Secondary | ICD-10-CM

## 2017-07-22 ENCOUNTER — Ambulatory Visit (HOSPITAL_COMMUNITY): Payer: Medicaid Other

## 2017-07-29 ENCOUNTER — Inpatient Hospital Stay (HOSPITAL_COMMUNITY): Admission: RE | Admit: 2017-07-29 | Payer: Medicaid Other | Source: Ambulatory Visit

## 2017-08-01 ENCOUNTER — Inpatient Hospital Stay (HOSPITAL_COMMUNITY): Admission: RE | Admit: 2017-08-01 | Payer: Medicaid Other | Source: Ambulatory Visit

## 2017-08-02 ENCOUNTER — Encounter (HOSPITAL_COMMUNITY): Payer: Medicaid Other

## 2017-08-03 NOTE — Progress Notes (Signed)
  Radiation Oncology         [phone] ________________________________  Name: Heidi [name] MRN: [id number]  Date: 06/17/2017  DOB: Sep 14, 1986  Chart Note:  I saw the patient during this encounter and we documented our findings.  However, the documentation was pending and has been deleted.  There will be no charge for this visit.  We requested re-staging PET and also requested that the patient proceed to previously requested consultation regarding bone marrow transplant at [facility].  Given her socioeconomic position, we will pull together proper resources to support her through these tasks, and re-evaluate for possible radiation after that.  Given this limited documentation, there will be no professional charge for this visit.  ________________________________  Sheral Apley. Tammi Klippel, M.D.

## 2017-08-10 ENCOUNTER — Ambulatory Visit: Payer: Medicaid Other | Admitting: Hematology

## 2017-08-10 ENCOUNTER — Other Ambulatory Visit: Payer: Medicaid Other

## 2017-08-10 ENCOUNTER — Ambulatory Visit: Payer: Medicaid Other

## 2017-08-11 ENCOUNTER — Telehealth: Payer: Self-pay | Admitting: *Deleted

## 2017-08-12 ENCOUNTER — Ambulatory Visit (HOSPITAL_COMMUNITY): Payer: Medicaid Other

## 2017-08-12 ENCOUNTER — Ambulatory Visit (HOSPITAL_COMMUNITY): Admission: RE | Admit: 2017-08-12 | Payer: Medicaid Other | Source: Ambulatory Visit

## 2017-08-16 ENCOUNTER — Other Ambulatory Visit: Payer: Self-pay | Admitting: Medical Oncology

## 2017-08-18 ENCOUNTER — Ambulatory Visit (HOSPITAL_COMMUNITY)
Admission: RE | Admit: 2017-08-18 | Discharge: 2017-08-18 | Disposition: A | Discharge status: Home / Self Care | Payer: Medicaid Other | Source: Ambulatory Visit | Attending: Hematology | Admitting: Hematology

## 2017-08-18 DIAGNOSIS — I10 Essential (primary) hypertension: Secondary | ICD-10-CM | POA: Diagnosis not present

## 2017-08-18 DIAGNOSIS — I313 Pericardial effusion (noninflammatory): Secondary | ICD-10-CM | POA: Diagnosis not present

## 2017-08-18 DIAGNOSIS — R0600 Dyspnea, unspecified: Secondary | ICD-10-CM | POA: Diagnosis not present

## 2017-08-18 DIAGNOSIS — I3139 Other pericardial effusion (noninflammatory): Secondary | ICD-10-CM

## 2017-08-18 NOTE — Progress Notes (Signed)
  Echocardiogram 2D Echocardiogram has been performed.  Rafan Sanders T Jairo Bellew 08/18/2017, 11:04 AM

## 2017-08-31 ENCOUNTER — Other Ambulatory Visit: Payer: Medicaid Other

## 2017-08-31 ENCOUNTER — Ambulatory Visit: Payer: Medicaid Other

## 2017-08-31 ENCOUNTER — Encounter: Payer: Self-pay | Admitting: Hematology

## 2017-08-31 ENCOUNTER — Telehealth: Payer: Self-pay

## 2017-08-31 ENCOUNTER — Ambulatory Visit: Payer: Medicaid Other | Admitting: Hematology

## 2017-08-31 ENCOUNTER — Other Ambulatory Visit: Payer: Self-pay | Admitting: Hematology

## 2017-08-31 NOTE — Telephone Encounter (Signed)
Letter being sent to pt today d/t No Show. Pt has not shown up for appointments in the past and not informed our office of inability to come. Letter being sent to inform patient that after two no shows we no longer will see patients at our office and she will have to seek care elsewhere.

## 2017-09-14 ENCOUNTER — Ambulatory Visit (HOSPITAL_COMMUNITY): Admission: RE | Admit: 2017-09-14 | Payer: Medicaid Other | Source: Ambulatory Visit

## 2017-09-26 ENCOUNTER — Encounter (HOSPITAL_COMMUNITY)
Admission: RE | Admit: 2017-09-26 | Discharge: 2017-09-26 | Disposition: A | Discharge status: Home / Self Care | Payer: Medicaid Other | Source: Ambulatory Visit | Attending: Urology | Admitting: Urology

## 2017-09-26 DIAGNOSIS — C8128 Mixed cellularity classical Hodgkin lymphoma, lymph nodes of multiple sites: Secondary | ICD-10-CM | POA: Diagnosis not present

## 2017-09-26 LAB — GLUCOSE, CAPILLARY: GLUCOSE-CAPILLARY: 79 mg/dL (ref 65–99)

## 2017-09-26 MED ORDER — FLUDEOXYGLUCOSE F - 18 (FDG) INJECTION
6.9000 | Freq: Once | INTRAVENOUS | Status: AC | PRN
  Administered 2017-09-26: 6.9 via INTRAVENOUS

## 2017-09-27 ENCOUNTER — Ambulatory Visit
Admission: RE | Admit: 2017-09-27 | Discharge: 2017-09-27 | Disposition: A | Discharge status: Home / Self Care | Payer: Medicaid Other | Source: Ambulatory Visit | Attending: Radiation Oncology | Admitting: Radiation Oncology

## 2017-09-27 DIAGNOSIS — C8128 Mixed cellularity classical Hodgkin lymphoma, lymph nodes of multiple sites: Secondary | ICD-10-CM | POA: Insufficient documentation

## 2017-09-28 NOTE — Progress Notes (Addendum)
Glorimar Jimmye Norman 31 y.o. woman with Refractory Mixed cellularity Hodgkin's lymphoma IVBE with extensive lymphadenopathy including right axillary, mediastinal and upper retroperitoneal and now biopsy proven pulmonary involvement,  review 09-26-17 PET, FU.  PAIN: She is currently  not having pain. SKIN:Warm and dry. Swallowing issues:Denies having swallowing problems while eating and drinking. Appetite:Good eating two meals a day with snacks. Respiratory issues:SOB,coughing nonproductive WEIGHT:  Wt Readings from Last 3 Encounters:  09/29/17 142 lb (64.4 kg)  07/20/17 139 lb 3.2 oz (63.1 kg)  06/17/17 136 lb 12.8 oz (62.1 kg)   Imaging; 09-26-17 PET BP (!) 142/93   Pulse 70   Temp 98.2 F (36.8 C) (Oral)   Resp 18   Ht 5\' 7"  (1.702 m)   Wt 142 lb (64.4 kg)   SpO2 98%   BMI 22.24 kg/m

## 2017-09-29 ENCOUNTER — Encounter: Payer: Self-pay | Admitting: Urology

## 2017-09-29 ENCOUNTER — Ambulatory Visit
Admission: RE | Admit: 2017-09-29 | Discharge: 2017-09-29 | Disposition: A | Discharge status: Home / Self Care | Payer: Medicaid Other | Source: Ambulatory Visit | Attending: Urology | Admitting: Urology

## 2017-09-29 VITALS — BP 142/93 | HR 70 | Temp 98.2°F | Resp 18 | Ht 67.0 in | Wt 142.0 lb

## 2017-09-29 DIAGNOSIS — C8128 Mixed cellularity classical Hodgkin lymphoma, lymph nodes of multiple sites: Secondary | ICD-10-CM | POA: Diagnosis present

## 2017-09-30 NOTE — Progress Notes (Signed)
Radiation Oncology         (336) [phone] ________________________________  Name: Heidi [name] MRN: [id number]  Date: 09/29/2017  DOB: [birth year]  Follow up Note:   Radiation Oncology         (336) [phone] ________________________________  Name: Heidi [name] MRN: [id number]  Date: 09/29/2017  DOB: 12-May-1986  CC: Patient, No Pcp Per  Brunetta Genera, MD  Diagnosis:   31 y.o. woman with Refractory, mixed cellularity Hodgkins lymphoma, IVBE with extensive lymphadenopathy involving the right axilla, mediastinum, upper retroperitoneal and left lung.  Interval Since Last Radiation:  10 months, Palliative radiotherapy 12/16/16 - 12/23/16:  The chest was treated with 8 Gy in 2 fractions and then a boost of 9 Gy in 3 fractions to a total of 17 Gy in 5 fractions.  Narrative:  The patient returns today for routine follow-up and to discuss results from her recent follow up PET scan.  In summary, Heidi [name] is a 31 y.o. female initially seen at the request of Dr. Irene Limbo in 11/2016 for a history of progressive Hodgkin's lymphoma.  Apparently the patient has been diagnosed with this disease for some time, and has received chemotherapy in the past which gave her near complete response. She presented to the hospital in early December 2017 with complaints of progressive swelling in her right upper extremity as well as increasing shortness of breath. She had been hospitalized approximately 1 month prior to that and at that point in time was considering the options for treating a large tumor compressing her right lung fields. However at the time, she also had pneumonia, and the plan was to move forward with systemic therapy to shrink her disease and radiation was postponed. She left AGAINST MEDICAL ADVICE on 12/04/2016 but returned to the [facility] on 12/14/2016 complaining of increasing shortness of breath and edema of her right face and right upper extremity.   A chest xray in the  emergency department on 12/14/2016 revealed widespread consolidation in the right lung, and stable infiltrate in the left upper lobe. A repeat chest CT was performed on 12/15/16, and revealed anterior mediastinal adenopathy measuring 2.9 x 3.6 cm which previously been 2.1 x 3.9 cm a few weeks prior, right peritracheal adenopathy measuring up to 16 mm, subcarinal adenopathy measuring 17 mm, and progression of the right lung consolidation involving the entire lung. Compression and occlusion of the distal portion of the right mainstem bronchus was noted, as well as nonopacification of the right received brachiocephalic vein and right IJ concerning for occlusion and impending SVC syndrome.  She completed palliative radiotherapy on 12/23/16 with resolution of the facial and upper extremity swelling as well as improved breathing.  She was started on 2nd line treatment with Bendamustine/Brentuximab on 12/30/2016 and has since completed 8 cycles but has missed and/or cancelled multiple appointments since her last treatment in 07/20/2017.  She has also been referred to Dr/ Dellis Filbert at [facility] for evaluation regarding AutoHSCT candidacy to determine options for further course of action but has cancelled this appointment on multiple occasions due to lack or transportation and/or childcare.  Social work was consulted in 05/2017 and offered to step in and assist with arranging transportation and childcare, however, despite multiple offers, the patient has not been able to make a follow up appointment with Dr. Dellis Filbert at [facility] that she has kept.   At our last visit on 06/17/17, we discussed the importance of disease restaging with PET scan as well as the need for evaluation  with Dr. Dellis Filbert regarding candidacy for AutoHSCT.  If patient is evaluated and deemed to be a candidate for stem cell transplant, we would recommend holding off on radiation therapy and proceeding with transplant which could be curative. However, if she is deemed to  not be a transplant candidate and her disease is well controlled on PET scan other than in the lung, we could consider consider involved site radiotherapy to her persistent disease in the right lung.  Recent PET scan 09/26/17 shows improved right lung airspace consolidation with decreased metabolic activity, consistent with partial response to therapy, decreased size and FDG uptake of right anterior mediastinal soft tissue density and no evidence of new or progressive lymphoma.                               On review of systems, the patient states hat she is doing very well overall. She has noticed significant improvement in her breathing over the past 4-6 months and no longer requires supplemental oxygen for ambulation. She also reports improved energy.  She denies chest pain, productive cough, increased shortness of breath or dyspnea. She reports a good appetite and is maintaining her weight. She denies recent fevers, chills or night sweats. She denies abdominal pain, nausea, vomiting or diarrhea.  ALLERGIES:  has No Known Allergies.  Meds: Current Outpatient Prescriptions  Medication Sig Dispense Refill  . lidocaine-prilocaine (EMLA) cream Apply 1 application topically as needed (numbing). 30 g 1  . dexamethasone (DECADRON) 4 MG tablet 2 tab (8mg ) twice a day for 3 days starting on day of chemotherapy (Patient not taking: Reported on 06/17/2017) 30 tablet 3  . guaiFENesin (ROBITUSSIN) 100 MG/5ML SOLN Take 5 mLs (100 mg total) by mouth every 6 (six) hours as needed for cough or to loosen phlegm. (Patient not taking: Reported on 06/17/2017) 1200 mL 0  . labetalol (NORMODYNE) 200 MG tablet Take 1 tablet (200 mg total) by mouth 3 (three) times daily. (Patient not taking: Reported on 06/17/2017) 90 tablet 1  . levalbuterol (XOPENEX HFA) 45 MCG/ACT inhaler Inhale 2 puffs into the lungs every 6 (six) hours as needed for wheezing. (Patient not taking: Reported on 06/17/2017) 1 Inhaler 12  . loratadine  (CLARITIN) 10 MG tablet Take 1 tablet (10 mg total) by mouth daily. For up to 5 days following Neulasta injection. (Patient not taking: Reported on 06/17/2017) 5 tablet 2  . ondansetron (ZOFRAN) 8 MG tablet Take 1 tablet (8 mg total) by mouth every 8 (eight) hours as needed for nausea (start on the 3rd day after chemotherapy). (Patient not taking: Reported on 06/17/2017) 30 tablet 3  . prochlorperazine (COMPAZINE) 10 MG tablet Take 1 tablet (10 mg total) by mouth every 6 (six) hours as needed for nausea or vomiting. (Patient not taking: Reported on 06/17/2017) 30 tablet 3  . rivaroxaban (XARELTO) 20 MG TABS tablet Take 1 tablet (20 mg total) by mouth daily with supper. (Patient not taking: Reported on 06/17/2017) 30 tablet 1   No current facility-administered medications for this encounter.     Physical Findings:  height is 5\' 7"  (1.702 m) and weight is 142 lb (64.4 kg). Her oral temperature is 98.2 F (36.8 C). Her blood pressure is 142/93 (abnormal) and her pulse is 70. Her respiration is 18 and oxygen saturation is 98%.  Pain Assessment Pain Score: 0-No pain/10 In general this is a well appearing african [place] female in no acute distress. She's  alert and oriented x4 and appropriate throughout the examination. Cardiopulmonary assessment is negative for acute distress and she exhibits normal effort. Lungs are clear to ausculation with decreased breath sounds on the right.  Lower extremities are negative for cyanosis, clubbing, deep calf tenderness or pitting edema.  Abdomen is soft and non tender to palpation with normal BS in all quadrants.  Lab Findings: Lab Results  Component Value Date   WBC 3.5 (L) 07/20/2017   HGB 11.7 07/20/2017   HCT 35.2 07/20/2017   MCV 93.2 07/20/2017   PLT 266 07/20/2017     Radiographic Findings: Nm Pet Image Restag (ps) Skull Base To Thigh  Result Date: 09/26/2017 CLINICAL DATA:  Subsequent treatment strategy for Hodgkin's lymphoma. EXAM: NUCLEAR MEDICINE  PET SKULL BASE TO THIGH TECHNIQUE: 6.9 mCi F-18 FDG was injected intravenously. Full-ring PET imaging was performed from the skull base to thigh after the radiotracer. CT data was obtained and used for attenuation correction and anatomic localization. FASTING BLOOD GLUCOSE:  Value: 79 mg/dl COMPARISON:  04/13/2017 FINDINGS: NECK:  No hypermetabolic lymph nodes or masses. CHEST: Soft tissue density in right anterior mediastinum currently measures 2.5 x 1.3 cm on image 63/4, compared with 3.5 x 1.6 cm previously. This has SUV max of 1.7, compared with 2.1 previously. 11 mm right paratracheal lymph node on image 57/4 is unchanged in size but shows no associated FDG uptake on today's exam. Previously seen small pericardial effusion has also resolved. Diffuse right lung airspace consolidation and patchy areas of airspace disease in left upper lobe show interval improvement since previous study. SUV max obtain in the posterior right lung measures 2.3, compared with 7.8 previously. ABDOMEN/PELVIS: No abnormal hypermetabolic activity within the liver, pancreas, adrenal glands, or spleen. No hypermetabolic lymph nodes in the abdomen or pelvis. Tiny amount of free fluid in pelvic cul-de-sac shows no FDG uptake, and is most likely physiologic. SKELETON: No focal hypermetabolic bone lesions to suggest skeletal metastasis. IMPRESSION: Improved right lung airspace consolidation with decreased metabolic activity, consistent with partial response to therapy. Deauville score 2. Decreased size and FDG uptake of right anterior mediastinal soft tissue density. Deauville score 2. No evidence of new or progressive lymphoma. Electronically Signed   By: Earle Gell M.D.   On: 09/26/2017 16:29    Impression/Plan: 1. 31 y.o. woman with Refractory, Mixed Cellularity Hodgkins lymphoma, IVBE with extensive lymphadenopathy involving the right axilla, mediastinum, upper retroperitoneal and left lung.   Recent PET imaging shows improved right  lung airspace consolidation with decreased metabolic activity, consistent with partial response to therapy, decreased size and FDG uptake of right anterior mediastinal soft tissue density and no evidence of new or progressive lymphoma. She is strongly encouraged to follow through with consultation at [facility] with Dr. Dellis Filbert to determine candidacy for AutoHSCT.  She states her compliance with this recommendation and seems genuinely interested and committed to following through. If patient is evaluated and deemed to be a candidate for stem cell transplant, we would recommend holding off on radiation therapy and proceeding with transplant which could be curative. However, if she is deemed to not be a transplant candidate, we could consider involved site radiotherapy to her persistent disease in the right lung since her disease appears to be relatively well controlled on PET scan other than in the lung.     Nicholos Johns, PA-C

## 2018-08-10 ENCOUNTER — Telehealth: Payer: Self-pay

## 2018-08-10 NOTE — Telephone Encounter (Signed)
Spoke with patient concerning new appointments that was scheduled for per 8/13 sch msg. Mailed a letter with a calender enclosed

## 2018-08-16 ENCOUNTER — Other Ambulatory Visit: Payer: Self-pay

## 2018-08-16 DIAGNOSIS — C8128 Mixed cellularity classical Hodgkin lymphoma, lymph nodes of multiple sites: Secondary | ICD-10-CM

## 2018-08-25 ENCOUNTER — Other Ambulatory Visit: Payer: Medicaid Other

## 2018-08-25 ENCOUNTER — Ambulatory Visit: Payer: Medicaid Other | Admitting: Hematology

## 2018-08-31 ENCOUNTER — Inpatient Hospital Stay (HOSPITAL_BASED_OUTPATIENT_CLINIC_OR_DEPARTMENT_OTHER): Payer: Medicaid Other | Admitting: Hematology

## 2018-08-31 ENCOUNTER — Inpatient Hospital Stay: Payer: Medicaid Other | Attending: Hematology

## 2018-08-31 ENCOUNTER — Inpatient Hospital Stay: Payer: Medicaid Other

## 2018-08-31 ENCOUNTER — Encounter: Payer: Self-pay | Admitting: Hematology

## 2018-08-31 VITALS — BP 112/76 | HR 144 | Temp 99.8°F | Resp 18 | Ht 67.0 in | Wt 136.8 lb

## 2018-08-31 DIAGNOSIS — C8128 Mixed cellularity classical Hodgkin lymphoma, lymph nodes of multiple sites: Secondary | ICD-10-CM | POA: Diagnosis not present

## 2018-08-31 DIAGNOSIS — G620 Drug-induced polyneuropathy: Secondary | ICD-10-CM | POA: Diagnosis not present

## 2018-08-31 DIAGNOSIS — I313 Pericardial effusion (noninflammatory): Secondary | ICD-10-CM | POA: Insufficient documentation

## 2018-08-31 DIAGNOSIS — Z9119 Patient's noncompliance with other medical treatment and regimen: Secondary | ICD-10-CM | POA: Insufficient documentation

## 2018-08-31 DIAGNOSIS — C8599 Non-Hodgkin lymphoma, unspecified, extranodal and solid organ sites: Secondary | ICD-10-CM

## 2018-08-31 DIAGNOSIS — Z87891 Personal history of nicotine dependence: Secondary | ICD-10-CM | POA: Insufficient documentation

## 2018-08-31 DIAGNOSIS — R634 Abnormal weight loss: Secondary | ICD-10-CM | POA: Insufficient documentation

## 2018-08-31 DIAGNOSIS — J45909 Unspecified asthma, uncomplicated: Secondary | ICD-10-CM | POA: Diagnosis not present

## 2018-08-31 DIAGNOSIS — Z9114 Patient's other noncompliance with medication regimen: Secondary | ICD-10-CM | POA: Diagnosis not present

## 2018-08-31 DIAGNOSIS — Z7901 Long term (current) use of anticoagulants: Secondary | ICD-10-CM | POA: Insufficient documentation

## 2018-08-31 LAB — SEDIMENTATION RATE: Sed Rate: 9 mm/hr (ref 0–22)

## 2018-08-31 LAB — CMP (CANCER CENTER ONLY)
ALT: 7 U/L (ref 0–44)
ANION GAP: 12 (ref 5–15)
AST: 13 U/L — AB (ref 15–41)
Albumin: 2.3 g/dL — ABNORMAL LOW (ref 3.5–5.0)
Alkaline Phosphatase: 103 U/L (ref 38–126)
BUN: 4 mg/dL — AB (ref 6–20)
CO2: 29 mmol/L (ref 22–32)
Calcium: 8.5 mg/dL — ABNORMAL LOW (ref 8.9–10.3)
Chloride: 93 mmol/L — ABNORMAL LOW (ref 98–111)
Creatinine: 0.71 mg/dL (ref 0.44–1.00)
GFR, Est AFR Am: >60 mL/min (ref >60)
Glucose, Bld: 182 mg/dL — ABNORMAL HIGH (ref 70–99)
POTASSIUM: 3.3 mmol/L — AB (ref 3.5–5.1)
Sodium: 134 mmol/L — ABNORMAL LOW (ref 135–145)
TOTAL PROTEIN: 5.3 g/dL — AB (ref 6.5–8.1)
Total Bilirubin: 0.6 mg/dL (ref 0.3–1.2)

## 2018-08-31 LAB — CBC WITH DIFFERENTIAL (CANCER CENTER ONLY)
Basophils Absolute: 0 10*3/uL (ref 0.0–0.1)
Basophils Relative: 0 %
Eosinophils Absolute: 0.1 10*3/uL (ref 0.0–0.5)
Eosinophils Relative: 1 %
HEMATOCRIT: 38.7 % (ref 34.8–46.6)
Hemoglobin: 12.8 g/dL (ref 11.6–15.9)
LYMPHS ABS: 0.6 10*3/uL — AB (ref 0.9–3.3)
LYMPHS PCT: 4 %
MCH: 31.3 pg (ref 25.1–34.0)
MCHC: 33.1 g/dL (ref 31.5–36.0)
MCV: 94.6 fL (ref 79.5–101.0)
MONO ABS: 0.6 10*3/uL (ref 0.1–0.9)
Monocytes Relative: 4 %
NEUTROS ABS: 15.4 10*3/uL — AB (ref 1.5–6.5)
Neutrophils Relative %: 91 %
PLATELETS: 481 10*3/uL — AB (ref 145–400)
RBC: 4.09 MIL/uL (ref 3.70–5.45)
RDW: 13.8 % (ref 11.2–14.5)
WBC Count: 16.8 10*3/uL — ABNORMAL HIGH (ref 3.9–10.3)

## 2018-08-31 LAB — LACTATE DEHYDROGENASE: LDH: 350 U/L — AB (ref 98–192)

## 2018-08-31 NOTE — Progress Notes (Signed)
Heidi [name]  HEMATOLOGY ONCOLOGY PROGRESS NOTE  Date of service:   08/31/18     Patient Care Team: Patient, No Pcp Per as PCP - General (General Practice)  Chief complaint: Follow-up for Hodgkin's lymphoma  Diagnosis:   Refractory Mixed cellularity Hodgkin's lymphoma IVBE with extensive lymphadenopathy including right axillary, mediastinal and upper retroperitoneal and now biopsy proven pulmonary involvement. She was noted to have significant constitutional symptoms including significant weight loss, fevers chills and some night sweats.  Current Treatment: none. She was lost to f/u for >15yr   Previous treatment 5 cycles of AVD (without bleomycin due to lung involvement and DLCO of 37%, active smoker) Multiple avoidable treatment delays due to the patient's noncompliance with follow-up for avoidable reasons. She has been counseled repeatedly that this would increase the likelihood of unfavorable outcome.  2nd line therapy with Bendamustine + Brentuximab s/p 7 cycles.  INTERVAL HISTORY:  Ms WCubbageis here for follow-up for her Hodgkins lymphoma. The patient's last visit with [facility] on 07/20/17. She is accompanied today by her cousin. The pt reports that she is doing well overall.   The pt had an extensive history of no-shows and non-compliance with follow up that was discussed in detail at numerous visits. Her last visit was 07/20/17.   Most recently, [facility] with a [facility] Chest as noted below. [facility]   The pt reports that she has lost 15 pounds over the last month. She notes that her appetite has been decreasing. She adds that she is not having much appetite. She has been having difficulty breathing recently. She was taking Levaquin after discharge and does not endorse this helping her symptoms.   The pt notes that she is no longer smoking at all and intends to maintain compliance and  follow up.   Of note since the patient's last visit, pt has had CTA Chest completed on 08/04/18 with results revealing No acute pulmonary thromboembolism. 2.Large multifocal consolidations within the lungs bilaterally consistent with severe pneumonia and/or aspiration pneumonitis. Concurrent worsening of pulmonary involvement of lymphoma is not excluded given substantial bilateral nodularity which has increased from prior PET. Bronchoscopy could further evaluate if clinically appropriate. 3.Interlobular septal thickening and interstitial opacities may be a degree of mild pulmonary edema or sequela of infectious/inflammatory process. 4.Small right pleural effusion. 5.Substantial stenosis of the right axillary/brachiocephalic vein at the inlet with no significant opacification of the central right brachiocephalic vein, with occlusion or thrombosis not excluded. Vascular ultrasound of the right upper extremity could further evaluate. 6.Increased prominence of multiple anterior diaphragmatic lymph nodes as well as conspicuous lymph nodes within the partially imaged upper abdomen in this patient with known lymphoma.  7.Esophageal air-fluid level placing patient at increased risk for aspiration.  Lab results (08/15/18) of CBC w/diff, CMP, and Reticulocytes is as follows: all values are WNL except for RBC at 3.49, HGB at 11.6, HCT at 33.9, MCH at 33.4, PLT at 412k, ANC at 8.2k, Lymphs abs at 100.   On review of systems, pt reports coughing, SOB, weight loss, decreased appetite, nausea, and denies fevers, night sweats, new lumps or bumps, abdominal pain, abdominal distension, back pain, leg swelling, and any other symptoms.    REVIEW OF SYSTEMS:    A 10+ POINT REVIEW OF SYSTEMS WAS OBTAINED including neurology, dermatology, psychiatry, cardiac, respiratory, lymph, extremities, GI, GU, Musculoskeletal, constitutional, breasts, reproductive, HEENT.  All pertinent positives are noted in the HPI.  All others  are negative.  . Past Medical History:  Diagnosis Date  . ARDS (adult respiratory distress syndrome) ([place])   . Asthma   . Hodgkin lymphoma ([place])   . Hypertension     . Past Surgical History:  Procedure Laterality Date  . AXILLARY LYMPH NODE BIOPSY Right 03/19/2016   Procedure: AXILLARY LYMPH NODE BIOPSY;  Surgeon: Armandina Gemma, MD;  Location: [facility];  Service: General;  Laterality: Right;  . IR GENERIC HISTORICAL  12/22/2016   IR FLUORO GUIDE PORT INSERTION LEFT 12/22/2016 [facility]  . IR GENERIC HISTORICAL  12/22/2016   IR US GUIDE VASC ACCESS LEFT 12/22/2016 [facility]  . IR GENERIC HISTORICAL  12/22/2016   IR CV LINE INJECTION 12/22/2016 [facility]  . IR GENERIC HISTORICAL  12/22/2016   IR REMOVAL TUN ACCESS W/ PORT W/O FL MOD SED 12/22/2016 [facility]  . VIDEO BRONCHOSCOPY Bilateral 11/26/2016   Procedure: VIDEO BRONCHOSCOPY WITH FLUORO;  Surgeon: Rigoberto Noel, MD;  Location: [facility] ENDOSCOPY;  Service: Cardiopulmonary;  Laterality: Bilateral;    . Social History   Tobacco Use  . Smoking status: Former Smoker    Packs/day: 0.50    Years: 15.00    Pack years: 7.50    Types: Cigarettes    Last attempt to quit: 03/27/2016    Years since quitting: 2.4  . Smokeless tobacco: Never Used  Substance Use Topics  . Alcohol use: Yes    Comment: occasional now  . Drug use: Not on file    ALLERGIES:  has No Known Allergies.  MEDICATIONS:  Current Outpatient Medications  Medication Sig Dispense Refill  . levalbuterol (XOPENEX HFA) 45 MCG/ACT inhaler Inhale 2 puffs into the lungs every 6 (six) hours as needed for wheezing. 1 Inhaler 12  . lidocaine-prilocaine (EMLA) cream Apply 1 application topically as needed (numbing). (Patient not taking: Reported on 08/31/2018) 30 g 1   No current facility-administered medications for this visit.     PHYSICAL EXAMINATION: ECOG PERFORMANCE STATUS: 1 - Symptomatic but completely ambulatory  . Vitals:   08/31/18 1518  BP: 112/76   Pulse: (!) 144  Resp: 18  Temp: 99.8 F (37.7 C)  SpO2: (!) 86%    Filed Weights   08/31/18 1518  Weight: 136 lb 12.8 oz (62.1 kg)   .Body mass index is 21.43 kg/m.  GENERAL:alert, in no acute distress and comfortable SKIN: no acute rashes, no significant lesions EYES: conjunctiva are pink and non-injected, sclera anicteric OROPHARYNX: MMM, no exudates, no oropharyngeal erythema or ulceration NECK: supple, no JVD LYMPH:  no palpable lymphadenopathy in the cervical, axillary or inguinal regions LUNGS: diffuse crackles and rhonchi  HEART: regular rate & rhythm ABDOMEN:  normoactive bowel sounds , non tender, not distended. No palpable hepatosplenomegaly.  Extremity: no pedal edema PSYCH: alert & oriented x 3 with fluent speech NEURO: no focal motor/sensory deficits   LABORATORY DATA:   I have reviewed the data as listed  . CBC Latest Ref Rng & Units 08/31/2018 07/20/2017 05/26/2017  WBC 3.9 - 10.3 K/uL 16.8(H) 3.5(L) 1.9(L)  Hemoglobin 11.6 - 15.9 g/dL 12.8 11.7 11.8  Hematocrit 34.8 - 46.6 % 38.7 35.2 35.8  Platelets 145 - 400 K/uL 481(H) 266 244   . CBC    Component Value Date/Time   WBC 16.8 (H) 08/31/2018 1441   WBC 3.5 (L) 07/20/2017 1315   WBC 12.5 (H) 12/22/2016 0509   RBC 4.09 08/31/2018 1441  HGB 12.8 08/31/2018 1441   HGB 11.7 07/20/2017 1315   HCT 38.7 08/31/2018 1441   HCT 35.2 07/20/2017 1315   PLT 481 (H) 08/31/2018 1441   PLT 266 07/20/2017 1315   MCV 94.6 08/31/2018 1441   MCV 93.2 07/20/2017 1315   MCH 31.3 08/31/2018 1441   MCHC 33.1 08/31/2018 1441   RDW 13.8 08/31/2018 1441   RDW 14.5 07/20/2017 1315   LYMPHSABS 0.6 (L) 08/31/2018 1441   LYMPHSABS 0.3 (L) 07/20/2017 1315   MONOABS 0.6 08/31/2018 1441   MONOABS 0.5 07/20/2017 1315   EOSABS 0.1 08/31/2018 1441   EOSABS 0.2 07/20/2017 1315   BASOSABS 0.0 08/31/2018 1441   BASOSABS 0.0 07/20/2017 1315   . CMP Latest Ref Rng & Units 08/31/2018 07/20/2017 05/26/2017  Glucose 70 - 99 mg/dL  182(H) 68(L) 79  BUN 6 - 20 mg/dL 4(L) 5.1(L) 5.1(L)  Creatinine 0.44 - 1.00 mg/dL 0.71 0.8 0.7  Sodium 135 - 145 mmol/L 134(L) 143 142  Potassium 3.5 - 5.1 mmol/L 3.3(L) 4.0 4.0  Chloride 98 - 111 mmol/L 93(L) - -  CO2 22 - 32 mmol/L 29 30(H) 29  Calcium 8.9 - 10.3 mg/dL 8.5(L) 9.7 9.9  Total Protein 6.5 - 8.1 g/dL 5.3(L) 6.8 6.9  Total Bilirubin 0.3 - 1.2 mg/dL 0.6 0.64 0.43  Alkaline Phos 38 - 126 U/L 103 116 105  AST 15 - 41 U/L 13(L) 20 19  ALT 0 - 44 U/L 7 12 8      RADIOGRAPHIC STUDIES: I have personally reviewed the radiological images as listed and agreed with the findings in the report. No results found.  ASSESSMENT & PLAN:   32 y.o. African-American female with  #1 Refractory/Progressive Mixed cellularity Hodgkin's lymphoma IV B E with extensive lymphadenopathy including right axillary, mediastinal and upper retroperitoneal and now biopsy proven pulmonary involvement. She was noted to have significant constitutional symptoms including significant weight loss, fevers chills and some night sweats. HIV negative Hepatitis C and hepatitis B serologies negative. Echo with normal ejection fraction. Patient was treated with 5 cycles of AVD (Bleomycin held due to poor DLCO 37% and ongoing smoking). Multiple avoidable treatment delays due to the patient's noncompliance with follow-up for avoidable reasons. She has been counseled repeatedly that this would increase the likelihood of unfavorable outcome.  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 S/p 6 cycles of 2nd line treatment with Bendamustine/Brentuximab And 1 cycle of Bretuximab alone  -PET/CT scan results from 04/14/2017 were discussed in details. She appears to have some persistent disease in her right lung at [place] 5. It is difficult to say if this is recurrent or persistent disease since the patient failed to follow-up on multiple scheduled PET/CT scans in the early part and prior to her  second line treatment.  #2 s/p hypoxic respiratory failure with dense right lung consolidation and left upper lobe consolidation with SVC syndrome. Patient has completed palliative radiation to the right lung mass causing SVC compression and has had her port removed. Is on anticoagulation with Xarelto for possible thrombus. 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  #4 Non compliance with clinic and treatment followup. Missed 2nd dose of bendamustine with C2. Has missed multiple appointment for her PET/CT and missed appointment at [facility] for consideration of Transplant and still has not re-scheduled despite multiple reminders..  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 Patient counseled to have this scheduled urgently and several occasions and has not followed up regarding this. -sent scheduling request again  PLAN:  -Pt was lost to follow up after July 2018 and returned on 08/31/18 -Discussed that the patient's presentation is quite concerning, given her recent 08/14/18 CTA Chest, new need for oxygen, and unexpected weight loss -Discussed the patient's goals of care and the pt noted that she is ready to begin treatment again and maintain compliance and follow ups -Will order PET/CT -Will order blood tests today -Will order pulmonary function test   -patient was again strongly recommended to establish a PCP [facility] today PET/CT in 5-7 days Pulmonary function testing in 1 week RTC with Dr Irene Limbo in 2 weeks   The total time spent in the appt was 30 minutes and more than 50% was on counseling and direct patient cares.     Sullivan Lone MD MS AAHIVMS [facility] Hematology/Oncology Physician [facility]  (Office):       [phone] (Work cell):  [phone] (Fax):           [phone]  I, Baldwin Jamaica, am acting as a scribe for Dr. Irene Limbo  .I have reviewed the above documentation for accuracy and completeness, and I agree with the above. Brunetta Genera MD

## 2018-09-01 LAB — HEPATITIS B CORE ANTIBODY, TOTAL: HEP B C TOTAL AB: NEGATIVE

## 2018-09-01 LAB — HEPATITIS C ANTIBODY

## 2018-09-02 LAB — HEPATITIS B SURFACE ANTIGEN: Hepatitis B Surface Ag: NEGATIVE

## 2018-09-14 ENCOUNTER — Inpatient Hospital Stay: Payer: Medicaid Other | Admitting: Hematology

## 2018-09-14 ENCOUNTER — Telehealth: Payer: Self-pay

## 2018-09-14 NOTE — Telephone Encounter (Signed)
Printed avs and calender of upcoming appointment. per 9/19

## 2018-09-14 NOTE — Telephone Encounter (Signed)
Patient aware of PET scan scheduled for Monday September 30th at 7am with arrival at 6:30 am. Nothing to eat or drink after midnight. Patient verbalized understanding. Patient given the number to Central Scheduling because she stated she may have to reschedule appointment. Patient to schedule appointment with Dr. Irene Limbo after PET scan completed.

## 2018-09-18 NOTE — Progress Notes (Signed)
This encounter was created in error - please disregard.

## 2018-09-25 ENCOUNTER — Ambulatory Visit (HOSPITAL_COMMUNITY): Payer: Medicaid Other

## 2018-09-26 ENCOUNTER — Encounter (HOSPITAL_COMMUNITY)
Admission: RE | Admit: 2018-09-26 | Discharge: 2018-09-26 | Disposition: A | Discharge status: Home / Self Care | Payer: Medicaid Other | Source: Ambulatory Visit | Attending: Hematology | Admitting: Hematology

## 2018-09-26 DIAGNOSIS — C8128 Mixed cellularity classical Hodgkin lymphoma, lymph nodes of multiple sites: Secondary | ICD-10-CM | POA: Diagnosis not present

## 2018-09-26 LAB — GLUCOSE, CAPILLARY: Glucose-Capillary: 71 mg/dL (ref 70–99)

## 2018-09-26 MED ORDER — FLUDEOXYGLUCOSE F - 18 (FDG) INJECTION: 6.7000 | Freq: Once | INTRAVENOUS | Status: DC | PRN

## 2018-09-27 NOTE — Progress Notes (Signed)
[name] Kitchen  HEMATOLOGY ONCOLOGY PROGRESS NOTE  Date of service:   09/28/18     Patient Care Team: Clinic, General Medical as PCP - General (Family Medicine)  Chief complaint: Follow-up for Hodgkin's lymphoma  Diagnosis:   Refractory Mixed cellularity Hodgkin's lymphoma IVBE with extensive lymphadenopathy including right axillary, mediastinal and upper retroperitoneal and now biopsy proven pulmonary involvement. She was noted to have significant constitutional symptoms including significant weight loss, fevers chills and some night sweats.  Current Treatment: none. She was lost to f/u for >61yr   Previous treatment 5 cycles of AVD (without bleomycin due to lung involvement and DLCO of 37%, active smoker) Multiple avoidable treatment delays due to the patient's noncompliance with follow-up for avoidable reasons. She has been counseled repeatedly that this would increase the likelihood of unfavorable outcome.  2nd line therapy with Bendamustine + Brentuximab s/p 7 cycles.  INTERVAL HISTORY:  Heidi [name] is here for follow-up for her Hodgkins lymphoma. The patient's last visit with [place] was on 08/31/18. She is accompanied today by her cousin. The pt reports that she is doing well overall.   The pt reports that she is still using oxygen when she walks around. She denies any specific bone or back pains and abdominal pains as well. She denies any neuropathy.   The pt notes that she developed a painful rash around her left ribs and back in the last two days. She describes the pain as shooting and burning.   Of note since the patient's last visit, pt has had PET/CT completed on 09/26/18 with results revealing Imaging findings compatible with recurrence of disease. 2. Multiple new large areas of hypermetabolic nodularity and airspace consolidation within both lungs which is presumed to represent pulmonary involvement by lymphoma. Deauville criteria 5. 3. New hypermetabolic left supraclavicular, left  retroperitoneal, and bilateral pelvic lymph nodes. Deauville criteria 5. 4. Multifocal hypermetabolic osseous lesions.  Deauville criteria 5. 5. Small volume of ascites, new.  Lab results today (09/28/18) of CBC w/diff, CMP, and Reticulocytes is as follows: all values are WNL except for RBC at 3.69, RDW at 16.2, PLT at 460k, Lymphs abs at 100, Albumin at 3.1, AST at 12. 09/28/18 Sed Rate at 36 09/28/18 LDH at 284  On review of systems, pt reports persisting SOB, painful rash around left chest, and denies back pain, bone pains, abdominal pains, neuropathy, leg swelling, and any other symptoms.    REVIEW OF SYSTEMS:    A 10+ POINT REVIEW OF SYSTEMS WAS OBTAINED including neurology, dermatology, psychiatry, cardiac, respiratory, lymph, extremities, GI, GU, Musculoskeletal, constitutional, breasts, reproductive, HEENT.  All pertinent positives are noted in the HPI.  All others are negative.  . Past Medical History:  Diagnosis Date  . ARDS (adult respiratory distress syndrome) ([place])   . Asthma   . Hodgkin lymphoma ([place])   . Hypertension     . Past Surgical History:  Procedure Laterality Date  . AXILLARY LYMPH NODE BIOPSY Right 03/19/2016   Procedure: AXILLARY LYMPH NODE BIOPSY;  Surgeon: Armandina Gemma, MD;  Location: [facility];  Service: General;  Laterality: Right;  . IR GENERIC HISTORICAL  12/22/2016   IR FLUORO GUIDE PORT INSERTION LEFT 12/22/2016 [facility]  . IR GENERIC HISTORICAL  12/22/2016   IR US GUIDE VASC ACCESS LEFT 12/22/2016 [facility]  . IR GENERIC HISTORICAL  12/22/2016   IR CV LINE INJECTION 12/22/2016 [facility]  . IR GENERIC HISTORICAL  12/22/2016   IR REMOVAL TUN ACCESS W/ PORT W/O FL MOD SED  12/22/2016 [facility]  . VIDEO BRONCHOSCOPY Bilateral 11/26/2016   Procedure: VIDEO BRONCHOSCOPY WITH FLUORO;  Surgeon: Rigoberto Noel, MD;  Location: [facility];  Service: Cardiopulmonary;  Laterality: Bilateral;    . Social History   Tobacco Use  . Smoking status:  Former Smoker    Packs/day: 0.50    Years: 15.00    Pack years: 7.50    Types: Cigarettes    Last attempt to quit: 03/27/2016    Years since quitting: 2.5  . Smokeless tobacco: Never Used  Substance Use Topics  . Alcohol use: Yes    Comment: occasional now  . Drug use: Not on file    ALLERGIES:  has No Known Allergies.  MEDICATIONS:  Current Outpatient Medications  Medication Sig Dispense Refill  . gabapentin (NEURONTIN) 100 MG capsule Take 2 capsules (200 mg total) by mouth 2 (two) times daily. 60 capsule 0  . levalbuterol (XOPENEX HFA) 45 MCG/ACT inhaler Inhale 2 puffs into the lungs every 6 (six) hours as needed for wheezing. 1 Inhaler 12  . lidocaine-prilocaine (EMLA) cream Apply 1 application topically as needed (numbing). 30 g 1  . valACYclovir (VALTREX) 1000 MG tablet Take 1 tablet (1,000 mg total) by mouth 2 (two) times daily. 20 tablet 0   No current facility-administered medications for this visit.    Facility-Administered Medications Ordered in Other Visits  Medication Dose Route Frequency Provider Last Rate Last Dose  . fludeoxyglucose F - 18 (FDG) injection 6.7 millicurie  6.7 millicurie Intravenous Once PRN Kalman Jewels, MD        PHYSICAL EXAMINATION: ECOG PERFORMANCE STATUS: 1 - Symptomatic but completely ambulatory  Vitals:   09/28/18 1204  BP: (!) 135/96  Pulse: (!) 102  Resp: 18  Temp: 98.3 F (36.8 C)  SpO2: 93%    Filed Weights   09/28/18 1204  Weight: 133 lb 12.8 oz (60.7 kg)   .Body mass index is 20.96 kg/m.  GENERAL:alert, in no acute distress and comfortable SKIN: no acute rashes, no significant lesions EYES: conjunctiva are pink and non-injected, sclera anicteric OROPHARYNX: MMM, no exudates, no oropharyngeal erythema or ulceration NECK: supple, no JVD LYMPH:  no palpable lymphadenopathy in the cervical, axillary or inguinal regions LUNGS: diffuse crackles and rhonchi  HEART: regular rate & rhythm ABDOMEN:  normoactive bowel sounds  , non tender, not distended. No palpable hepatosplenomegaly.  Extremity: no pedal edema PSYCH: alert & oriented x 3 with fluent speech NEURO: no focal motor/sensory deficits   LABORATORY DATA:   I have reviewed the data as listed  . CBC Latest Ref Rng & Units 09/28/2018 08/31/2018 07/20/2017  WBC 3.9 - 10.3 K/uL 6.2 16.8(H) 3.5(L)  Hemoglobin 11.6 - 15.9 g/dL 11.7 12.8 11.7  Hematocrit 34.8 - 46.6 % 35.2 38.7 35.2  Platelets 145 - 400 K/uL 460(H) 481(H) 266   . CBC    Component Value Date/Time   WBC 6.2 09/28/2018 1042   RBC 3.69 (L) 09/28/2018 1042   HGB 11.7 09/28/2018 1042   HGB 12.8 08/31/2018 1441   HGB 11.7 07/20/2017 1315   HCT 35.2 09/28/2018 1042   HCT 35.2 07/20/2017 1315   PLT 460 (H) 09/28/2018 1042   PLT 481 (H) 08/31/2018 1441   PLT 266 07/20/2017 1315   MCV 95.5 09/28/2018 1042   MCV 93.2 07/20/2017 1315   MCH 31.6 09/28/2018 1042   MCHC 33.1 09/28/2018 1042   RDW 16.2 (H) 09/28/2018 1042   RDW 14.5 07/20/2017 1315   LYMPHSABS 0.1 (L)  09/28/2018 1042   LYMPHSABS 0.3 (L) 07/20/2017 1315   MONOABS 0.7 09/28/2018 1042   MONOABS 0.5 07/20/2017 1315   EOSABS 0.2 09/28/2018 1042   EOSABS 0.2 07/20/2017 1315   BASOSABS 0.0 09/28/2018 1042   BASOSABS 0.0 07/20/2017 1315   . CMP Latest Ref Rng & Units 09/28/2018 08/31/2018 07/20/2017  Glucose 70 - 99 mg/dL 72 182(H) 68(L)  BUN 6 - 20 mg/dL 11 4(L) 5.1(L)  Creatinine 0.44 - 1.00 mg/dL 0.80 0.71 0.8  Sodium 135 - 145 mmol/L 141 134(L) 143  Potassium 3.5 - 5.1 mmol/L 4.8 3.3(L) 4.0  Chloride 98 - 111 mmol/L 103 93(L) -  CO2 22 - 32 mmol/L 30 29 30(H)  Calcium 8.9 - 10.3 mg/dL 9.1 8.5(L) 9.7  Total Protein 6.5 - 8.1 g/dL 6.6 5.3(L) 6.8  Total Bilirubin 0.3 - 1.2 mg/dL 0.3 0.6 0.64  Alkaline Phos 38 - 126 U/L 118 103 116  AST 15 - 41 U/L 12(L) 13(L) 20  ALT 0 - 44 U/L 9 7 12      RADIOGRAPHIC STUDIES: I have personally reviewed the radiological images as listed and agreed with the findings in the report. Nm  Pet Image Restag (ps) Skull Base To Thigh  Result Date: 09/26/2018 CLINICAL DATA:  Subsequent treatment strategy for Hodgkin's lymphoma. EXAM: NUCLEAR MEDICINE PET SKULL BASE TO THIGH TECHNIQUE: 6.7 mCi F-18 FDG was injected intravenously. Full-ring PET imaging was performed from the skull base to thigh after the radiotracer. CT data was obtained and used for attenuation correction and anatomic localization. Fasting blood glucose: 71 mg/dl COMPARISON:  09/26/2017 FINDINGS: Mediastinal blood pool activity: SUV max 1.64 Liver activity: SUV max 1.94 NECK: No hypermetabolic lymph nodes in the neck. Incidental CT findings: none CHEST: For left lateral supraclavicular lymph node measures 1.2 cm with SUV max of 7.8. New from previous exam. Slightly more medial there is a new left supraclavicular lymph node measuring 0.9 cm with SUV max of 4.87. No hypermetabolic axillary lymph nodes. No hypermetabolic mediastinal or hilar lymph nodes. Significant interval progression of bilateral hypermetabolic areas of nodularity and airspace consolidation. Right lower lobe area of airspace consolidation measures 8.5 cm with SUV max of 14.2. New from previous exam. Within the left lung there is a area of dense consolidation measuring 5.6 cm with SUV max of 9.6. New from previous exam. Right middle lobe consolidation measures approximately 4.9 cm and has an SUV max of 9.6. Incidental CT findings: Heart size appears enlarged. No pericardial effusion identified. ABDOMEN/PELVIS: No abnormal radiotracer uptake within the liver, pancreas, and spleen. The adrenal glands are unremarkable. Multiple new hypermetabolic left retroperitoneal lymph nodes. Index lymph node conglomeration measures 3.4 cm with SUV max 10.96. Multiple new hypermetabolic pelvic lymph nodes identified. Nodal conglomeration just below the bifurcation on the left measures 3.3 cm with SUV max of 11.04. New left external iliac node measures 1.7 cm with SUV max of 12.93. New  hypermetabolic right common iliac node measures 9 mm and has an SUV max of 5.47. Incidental CT findings: Small volume of ascites is identified. New from previous exam. SKELETON: Multifocal hypermetabolic bone lesions are identified, new from previous exam. Index lesion within the proximal left humerus has an SUV max of 3.36. Index lesion within the proximal left femur has an SUV max of 4.03. Right iliac bone lesion has an SUV max of 4.06. Within the left iliac wing there is a lesion with SUV max of 5.42. Incidental CT findings: none IMPRESSION: 1. Imaging findings compatible with  recurrence of disease. 2. Multiple new large areas of hypermetabolic nodularity and airspace consolidation within both lungs which is presumed to represent pulmonary involvement by lymphoma. Deauville criteria 5. 3. New hypermetabolic left supraclavicular, left retroperitoneal, and bilateral pelvic lymph nodes. Deauville criteria 5. 4. Multifocal hypermetabolic osseous lesions.  Deauville criteria 5. 5. Small volume of ascites, new. Electronically Signed   By: Kerby Moors M.D.   On: 09/26/2018 14:56    ASSESSMENT & PLAN:   32 y.o. African-American female with  #1 Refractory/Progressive Mixed cellularity Hodgkin's lymphoma IV B E with extensive lymphadenopathy including right axillary, mediastinal and upper retroperitoneal and now biopsy proven pulmonary involvement. She was noted to have significant constitutional symptoms including significant weight loss, fevers chills and some night sweats. HIV negative Hepatitis C and hepatitis B serologies negative. Echo with normal ejection fraction. Patient was treated with 5 cycles of AVD (Bleomycin held due to poor DLCO 37% and ongoing smoking). Multiple avoidable treatment delays due to the patient's noncompliance with follow-up for avoidable reasons. She has been counseled repeatedly that this would increase the likelihood of unfavorable outcome.  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 S/p 6 cycles of 2nd line treatment with Bendamustine/Brentuximab And 1 cycle of Bretuximab alone  -PET/CT scan results from 04/14/2017 were discussed in details. She appears to have some persistent disease in her right lung at Deauville 5. It is difficult to say if this is recurrent or persistent disease since the patient failed to follow-up on multiple scheduled PET/CT scans in the early part and prior to her second line treatment.  #2 s/p hypoxic respiratory failure with dense right lung consolidation and left upper lobe consolidation with SVC syndrome. Patient has completed palliative radiation to the right lung mass causing SVC compression and has had her port removed. Is on anticoagulation with Xarelto for possible thrombus. 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  #4 Non compliance with clinic and treatment followup. Missed 2nd dose of bendamustine with C2. Has missed multiple appointment for her PET/CT and missed appointment at [facility] for consideration of Transplant and still has not re-scheduled despite multiple reminders..  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 Patient counseled to have this scheduled urgently and several occasions and has not followed up regarding this. -sent scheduling request again  # 7 Shingles outbreak - curently PLAN: -Pt was lost to follow up after July 2018 and returned on 08/31/18 -Discussed that the patient's presentation is quite concerning, given her recent 08/14/18 CTA Chest, new need for oxygen, and unexpected weight loss -Discussed the patient's goals of care and the pt noted that she is ready to begin treatment again and maintain  compliance and follow ups -patient was again strongly recommended to establish a PCP ASAP -Discussed pt labwork today, 09/28/18; blood counts and chemistries are stable. Sed rate is increased to 36, LDH increased to 284.  -Discussed the 09/26/18 PET/CT which revealed Imaging findings compatible with recurrence of disease. 2. Multiple new large areas of hypermetabolic nodularity and airspace consolidation within both lungs which is presumed to represent pulmonary involvement by lymphoma. Deauville criteria 5. 3. New hypermetabolic left supraclavicular, left retroperitoneal, and bilateral pelvic lymph nodes. Deauville criteria 5. 4. Multifocal hypermetabolic osseous  lesions.  Deauville criteria 5. 5. Small volume of ascites, new. -Recommended that the pt should abide by contact precautions with her children if they haven't had the chicken pox vaccination or the disease, with concern that she has shingles currently -Will order Acyclovir, Neurontin, and lidocaine ointment for shingles  -Discussed treatment options of either more aggressive ICE with transplant or CAR T-cell therapy intention vs less aggressive combination chemotherapy and immunotherapy -Pt prefers to pursue ICE with transplant intent  -The pt still has a port, hasn't been flushed for more than a year due to previous no-shows, may need to refer pt to IR -Will set pt up for chemotherapy counseling    -Schedule inpatient admission to start ICE chemotherapy from 10/09/2018 x 3-4 days -port flush today -referral to [facility] for consideration of transplant and 2nd opinion   The total time spent in the appt was 45 minutes and more than 50% was on counseling and direct patient cares.     Heidi Lone MD Heidi AAHIVMS [facility] Hematology/Oncology Physician [facility]  (Office):       [phone] (Work cell):  [phone] (Fax):           [phone]  I, Baldwin [place], am acting as a scribe for Dr. Irene Limbo  .I have  reviewed the above documentation for accuracy and completeness, and I agree with the above. Brunetta Genera MD

## 2018-09-28 ENCOUNTER — Inpatient Hospital Stay: Payer: Medicaid Other

## 2018-09-28 ENCOUNTER — Telehealth: Payer: Self-pay

## 2018-09-28 ENCOUNTER — Inpatient Hospital Stay: Payer: Medicaid Other | Attending: Hematology | Admitting: Hematology

## 2018-09-28 ENCOUNTER — Ambulatory Visit (HOSPITAL_COMMUNITY)
Admission: RE | Admit: 2018-09-28 | Discharge: 2018-09-28 | Disposition: A | Discharge status: Home / Self Care | Payer: Medicaid Other | Source: Ambulatory Visit | Attending: Hematology | Admitting: Hematology

## 2018-09-28 VITALS — BP 135/96 | HR 102 | Temp 98.3°F | Resp 18 | Ht 67.0 in | Wt 133.8 lb

## 2018-09-28 DIAGNOSIS — B029 Zoster without complications: Secondary | ICD-10-CM | POA: Diagnosis not present

## 2018-09-28 DIAGNOSIS — Z95828 Presence of other vascular implants and grafts: Secondary | ICD-10-CM

## 2018-09-28 DIAGNOSIS — I313 Pericardial effusion (noninflammatory): Secondary | ICD-10-CM | POA: Insufficient documentation

## 2018-09-28 DIAGNOSIS — D696 Thrombocytopenia, unspecified: Secondary | ICD-10-CM | POA: Diagnosis not present

## 2018-09-28 DIAGNOSIS — R188 Other ascites: Secondary | ICD-10-CM

## 2018-09-28 DIAGNOSIS — C8128 Mixed cellularity classical Hodgkin lymphoma, lymph nodes of multiple sites: Secondary | ICD-10-CM

## 2018-09-28 DIAGNOSIS — G620 Drug-induced polyneuropathy: Secondary | ICD-10-CM

## 2018-09-28 DIAGNOSIS — Z9119 Patient's noncompliance with other medical treatment and regimen: Secondary | ICD-10-CM | POA: Diagnosis not present

## 2018-09-28 DIAGNOSIS — Z79899 Other long term (current) drug therapy: Secondary | ICD-10-CM | POA: Diagnosis not present

## 2018-09-28 DIAGNOSIS — D6481 Anemia due to antineoplastic chemotherapy: Secondary | ICD-10-CM | POA: Diagnosis not present

## 2018-09-28 DIAGNOSIS — Z87891 Personal history of nicotine dependence: Secondary | ICD-10-CM | POA: Diagnosis not present

## 2018-09-28 DIAGNOSIS — Z9114 Patient's other noncompliance with medication regimen: Secondary | ICD-10-CM | POA: Insufficient documentation

## 2018-09-28 DIAGNOSIS — Z5189 Encounter for other specified aftercare: Secondary | ICD-10-CM | POA: Diagnosis present

## 2018-09-28 DIAGNOSIS — Z7901 Long term (current) use of anticoagulants: Secondary | ICD-10-CM | POA: Insufficient documentation

## 2018-09-28 DIAGNOSIS — Z923 Personal history of irradiation: Secondary | ICD-10-CM | POA: Diagnosis not present

## 2018-09-28 LAB — PULMONARY FUNCTION TEST
DL/VA % pred: 76 %
DL/VA: 3.79 ml/min/mmHg/L
DLCO unc % pred: 22 %
DLCO unc: 5.99 ml/min/mmHg
FEF 25-75 Post: 1.42 L/sec
FEF 25-75 Pre: 1.09 L/sec
FEF2575-%Change-Post: 29 %
FEF2575-%Pred-Post: 44 %
FEF2575-%Pred-Pre: 34 %
FEV1-%Change-Post: 7 %
FEV1-%PRED-POST: 36 %
FEV1-%PRED-PRE: 34 %
FEV1-PRE: 0.97 L
FEV1-Post: 1.04 L
FEV1FVC-%Change-Post: 5 %
FEV1FVC-%Pred-Pre: 100 %
FEV6-%CHANGE-POST: 2 %
FEV6-%PRED-PRE: 34 %
FEV6-%Pred-Post: 34 %
FEV6-POST: 1.15 L
FEV6-PRE: 1.13 L
FEV6FVC-%PRED-POST: 101 %
FEV6FVC-%PRED-PRE: 101 %
FVC-%Change-Post: 2 %
FVC-%PRED-PRE: 33 %
FVC-%Pred-Post: 34 %
FVC-POST: 1.15 L
FVC-PRE: 1.13 L
POST FEV6/FVC RATIO: 100 %
PRE FEV1/FVC RATIO: 86 %
Post FEV1/FVC ratio: 90 %
Pre FEV6/FVC Ratio: 100 %
RV % pred: 94 %
RV: 1.43 L
TLC % PRED: 50 %
TLC: 2.67 L

## 2018-09-28 LAB — CBC WITH DIFFERENTIAL/PLATELET
BASOS ABS: 0 10*3/uL (ref 0.0–0.1)
Basophils Relative: 0 %
Eosinophils Absolute: 0.2 10*3/uL (ref 0.0–0.5)
Eosinophils Relative: 3 %
HEMATOCRIT: 35.2 % (ref 34.8–46.6)
Hemoglobin: 11.7 g/dL (ref 11.6–15.9)
LYMPHS PCT: 2 %
Lymphs Abs: 0.1 10*3/uL — ABNORMAL LOW (ref 0.9–3.3)
MCH: 31.6 pg (ref 25.1–34.0)
MCHC: 33.1 g/dL (ref 31.5–36.0)
MCV: 95.5 fL (ref 79.5–101.0)
Monocytes Absolute: 0.7 10*3/uL (ref 0.1–0.9)
Monocytes Relative: 12 %
NEUTROS PCT: 83 %
Neutro Abs: 5.1 10*3/uL (ref 1.5–6.5)
Platelets: 460 10*3/uL — ABNORMAL HIGH (ref 145–400)
RBC: 3.69 MIL/uL — AB (ref 3.70–5.45)
RDW: 16.2 % — ABNORMAL HIGH (ref 11.2–14.5)
WBC: 6.2 10*3/uL (ref 3.9–10.3)

## 2018-09-28 LAB — CMP (CANCER CENTER ONLY)
ALBUMIN: 3.1 g/dL — AB (ref 3.5–5.0)
ALT: 9 U/L (ref 0–44)
ANION GAP: 8 (ref 5–15)
AST: 12 U/L — ABNORMAL LOW (ref 15–41)
Alkaline Phosphatase: 118 U/L (ref 38–126)
BILIRUBIN TOTAL: 0.3 mg/dL (ref 0.3–1.2)
BUN: 11 mg/dL (ref 6–20)
CO2: 30 mmol/L (ref 22–32)
Calcium: 9.1 mg/dL (ref 8.9–10.3)
Chloride: 103 mmol/L (ref 98–111)
Creatinine: 0.8 mg/dL (ref 0.44–1.00)
GFR, Est AFR Am: >60 mL/min (ref >60)
Glucose, Bld: 72 mg/dL (ref 70–99)
POTASSIUM: 4.8 mmol/L (ref 3.5–5.1)
Sodium: 141 mmol/L (ref 135–145)
TOTAL PROTEIN: 6.6 g/dL (ref 6.5–8.1)

## 2018-09-28 LAB — SEDIMENTATION RATE: SED RATE: 36 mm/h — AB (ref 0–22)

## 2018-09-28 LAB — LACTATE DEHYDROGENASE: LDH: 284 U/L — ABNORMAL HIGH (ref 98–192)

## 2018-09-28 MED ORDER — VALACYCLOVIR HCL 1 G PO TABS: 1000.0000 mg | ORAL_TABLET | Freq: Two times a day (BID) | ORAL | 0 refills | Status: DC

## 2018-09-28 MED ORDER — HEPARIN SOD (PORK) LOCK FLUSH 100 UNIT/ML IV SOLN
500.0000 [IU] | Freq: Once | INTRAVENOUS | Status: AC | PRN
  Administered 2018-09-28: 500 [IU]
  Filled 2018-09-28: qty 5

## 2018-09-28 MED ORDER — SODIUM CHLORIDE 0.9% FLUSH
10.0000 mL | INTRAVENOUS | Status: DC | PRN
Start: 2018-09-28 — End: 2018-09-28
  Administered 2018-09-28: 10 mL
  Filled 2018-09-28: qty 10

## 2018-09-28 MED ORDER — ALBUTEROL SULFATE (2.5 MG/3ML) 0.083% IN NEBU
2.5000 mg | INHALATION_SOLUTION | Freq: Once | RESPIRATORY_TRACT | Status: AC
  Administered 2018-09-28: 2.5 mg via RESPIRATORY_TRACT

## 2018-09-28 MED ORDER — LIDOCAINE-PRILOCAINE 2.5-2.5 % EX CREA: 1.0000 "application " | TOPICAL_CREAM | CUTANEOUS | 1 refills | Status: DC | PRN

## 2018-09-28 MED ORDER — GABAPENTIN 100 MG PO CAPS: 200.0000 mg | ORAL_CAPSULE | Freq: Two times a day (BID) | ORAL | 0 refills | Status: DC

## 2018-09-28 NOTE — Telephone Encounter (Signed)
Referral made to [facility] Hematology Oncology per Dr. Grier Mitts request. Patient has an appointment on Monday 10/23/18 at 2:45 with Dr. Jolayne Haines. Dr. Jolayne Haines office will call the patient with appointment information as they are working on getting her is to be seen sooner.  Called and spoke to [place] in patient placement and made a bed request for inpatient admission on Monday October 14th on 6E oncology for 3-4 days per Dr. Irene Limbo. Patient aware bed placement will call her with bed assignment that morning.  E-mail sent to inpatient oncology group to notify of upcoming admission.

## 2018-10-09 ENCOUNTER — Other Ambulatory Visit: Payer: Self-pay | Admitting: Hematology

## 2018-10-09 ENCOUNTER — Other Ambulatory Visit: Payer: Self-pay

## 2018-10-09 ENCOUNTER — Encounter (HOSPITAL_COMMUNITY): Payer: Self-pay | Admitting: *Deleted

## 2018-10-09 ENCOUNTER — Inpatient Hospital Stay (HOSPITAL_COMMUNITY)
Admission: RE | Admit: 2018-10-09 | Discharge: 2018-10-12 | DRG: 846 | Disposition: A | Discharge status: Home / Self Care | Payer: Medicaid Other | Attending: Hematology | Admitting: Hematology

## 2018-10-09 DIAGNOSIS — C8128 Mixed cellularity classical Hodgkin lymphoma, lymph nodes of multiple sites: Secondary | ICD-10-CM | POA: Diagnosis not present

## 2018-10-09 DIAGNOSIS — C8120 Mixed cellularity classical Hodgkin lymphoma, unspecified site: Secondary | ICD-10-CM | POA: Diagnosis present

## 2018-10-09 DIAGNOSIS — Z923 Personal history of irradiation: Secondary | ICD-10-CM | POA: Diagnosis not present

## 2018-10-09 DIAGNOSIS — I871 Compression of vein: Secondary | ICD-10-CM | POA: Diagnosis present

## 2018-10-09 DIAGNOSIS — Z87891 Personal history of nicotine dependence: Secondary | ICD-10-CM

## 2018-10-09 DIAGNOSIS — Z5111 Encounter for antineoplastic chemotherapy: Secondary | ICD-10-CM | POA: Diagnosis not present

## 2018-10-09 DIAGNOSIS — I10 Essential (primary) hypertension: Secondary | ICD-10-CM | POA: Diagnosis present

## 2018-10-09 DIAGNOSIS — R591 Generalized enlarged lymph nodes: Secondary | ICD-10-CM | POA: Diagnosis present

## 2018-10-09 DIAGNOSIS — Z7189 Other specified counseling: Secondary | ICD-10-CM | POA: Insufficient documentation

## 2018-10-09 DIAGNOSIS — R918 Other nonspecific abnormal finding of lung field: Secondary | ICD-10-CM | POA: Diagnosis present

## 2018-10-09 DIAGNOSIS — B029 Zoster without complications: Secondary | ICD-10-CM

## 2018-10-09 DIAGNOSIS — Z9221 Personal history of antineoplastic chemotherapy: Secondary | ICD-10-CM

## 2018-10-09 DIAGNOSIS — J9601 Acute respiratory failure with hypoxia: Secondary | ICD-10-CM | POA: Diagnosis not present

## 2018-10-09 DIAGNOSIS — Z9119 Patient's noncompliance with other medical treatment and regimen: Secondary | ICD-10-CM

## 2018-10-09 DIAGNOSIS — J45909 Unspecified asthma, uncomplicated: Secondary | ICD-10-CM | POA: Diagnosis present

## 2018-10-09 DIAGNOSIS — Z9981 Dependence on supplemental oxygen: Secondary | ICD-10-CM | POA: Diagnosis not present

## 2018-10-09 DIAGNOSIS — G629 Polyneuropathy, unspecified: Secondary | ICD-10-CM | POA: Diagnosis present

## 2018-10-09 DIAGNOSIS — R188 Other ascites: Secondary | ICD-10-CM | POA: Diagnosis present

## 2018-10-09 DIAGNOSIS — J9691 Respiratory failure, unspecified with hypoxia: Secondary | ICD-10-CM | POA: Diagnosis present

## 2018-10-09 DIAGNOSIS — C8190 Hodgkin lymphoma, unspecified, unspecified site: Secondary | ICD-10-CM | POA: Diagnosis present

## 2018-10-09 DIAGNOSIS — L299 Pruritus, unspecified: Secondary | ICD-10-CM

## 2018-10-09 LAB — COMPREHENSIVE METABOLIC PANEL
ALK PHOS: 106 U/L (ref 38–126)
ALT: 11 U/L (ref 0–44)
AST: 17 U/L (ref 15–41)
Albumin: 2.9 g/dL — ABNORMAL LOW (ref 3.5–5.0)
Anion gap: 10 (ref 5–15)
BILIRUBIN TOTAL: 0.3 mg/dL (ref 0.3–1.2)
BUN: 8 mg/dL (ref 6–20)
CO2: 28 mmol/L (ref 22–32)
CREATININE: 0.58 mg/dL (ref 0.44–1.00)
Calcium: 8.8 mg/dL — ABNORMAL LOW (ref 8.9–10.3)
Chloride: 100 mmol/L (ref 98–111)
GFR calc Af Amer: >60 mL/min (ref >60)
GLUCOSE: 147 mg/dL — AB (ref 70–99)
Potassium: 3.2 mmol/L — ABNORMAL LOW (ref 3.5–5.1)
Sodium: 138 mmol/L (ref 135–145)
Total Protein: 6 g/dL — ABNORMAL LOW (ref 6.5–8.1)

## 2018-10-09 LAB — CBC WITH DIFFERENTIAL/PLATELET
ABS IMMATURE GRANULOCYTES: 0.05 10*3/uL (ref 0.00–0.07)
BASOS PCT: 0 %
Basophils Absolute: 0 10*3/uL (ref 0.0–0.1)
Eosinophils Absolute: 0.1 10*3/uL (ref 0.0–0.5)
Eosinophils Relative: 2 %
HCT: 34.6 % — ABNORMAL LOW (ref 36.0–46.0)
HEMOGLOBIN: 11 g/dL — AB (ref 12.0–15.0)
IMMATURE GRANULOCYTES: 1 %
LYMPHS PCT: 3 %
Lymphs Abs: 0.2 10*3/uL — ABNORMAL LOW (ref 0.7–4.0)
MCH: 30.1 pg (ref 26.0–34.0)
MCHC: 31.8 g/dL (ref 30.0–36.0)
MCV: 94.8 fL (ref 80.0–100.0)
Monocytes Absolute: 0.5 10*3/uL (ref 0.1–1.0)
Monocytes Relative: 8 %
NEUTROS ABS: 5.5 10*3/uL (ref 1.7–7.7)
NEUTROS PCT: 86 %
PLATELETS: 505 10*3/uL — AB (ref 150–400)
RBC: 3.65 MIL/uL — ABNORMAL LOW (ref 3.87–5.11)
RDW: 14 % (ref 11.5–15.5)
WBC: 6.4 10*3/uL (ref 4.0–10.5)
nRBC: 0 % (ref 0.0–0.2)

## 2018-10-09 LAB — RETICULOCYTES
Immature Retic Fract: 18.3 % — ABNORMAL HIGH (ref 2.3–15.9)
RBC.: 3.65 MIL/uL — ABNORMAL LOW (ref 3.87–5.11)
RETIC COUNT ABSOLUTE: 58 10*3/uL (ref 19.0–186.0)
RETIC CT PCT: 1.6 % (ref 0.4–3.1)

## 2018-10-09 LAB — PREGNANCY, URINE: PREG TEST UR: NEGATIVE

## 2018-10-09 LAB — SEDIMENTATION RATE: SED RATE: 25 mm/h — AB (ref 0–22)

## 2018-10-09 MED ORDER — SODIUM CHLORIDE 0.9 % IV SOLN
100.0000 mg/m2 | Freq: Once | INTRAVENOUS | Status: AC
  Administered 2018-10-09: 170 mg via INTRAVENOUS
  Filled 2018-10-09: qty 8.5

## 2018-10-09 MED ORDER — SODIUM CHLORIDE 0.9 % IV SOLN
Freq: Once | INTRAVENOUS | Status: AC
  Administered 2018-10-09: 16 mg via INTRAVENOUS
  Filled 2018-10-09: qty 8

## 2018-10-09 MED ORDER — SODIUM CHLORIDE 0.9 % IV SOLN
INTRAVENOUS | Status: DC
  Administered 2018-10-09 – 2018-10-11 (×2): via INTRAVENOUS

## 2018-10-09 MED ORDER — VALACYCLOVIR HCL 500 MG PO TABS
1000.0000 mg | ORAL_TABLET | Freq: Two times a day (BID) | ORAL | Status: DC
  Administered 2018-10-09 – 2018-10-12 (×6): 1000 mg via ORAL
  Filled 2018-10-09 (×6): qty 2

## 2018-10-09 MED ORDER — LIDOCAINE-PRILOCAINE 2.5-2.5 % EX CREA: 1.0000 "application " | TOPICAL_CREAM | CUTANEOUS | Status: DC | PRN

## 2018-10-09 MED ORDER — ENOXAPARIN SODIUM 40 MG/0.4ML ~~LOC~~ SOLN
40.0000 mg | SUBCUTANEOUS | Status: DC
  Filled 2018-10-09 (×3): qty 0.4

## 2018-10-09 MED ORDER — SODIUM CHLORIDE 0.9% FLUSH: 10.0000 mL | INTRAVENOUS | Status: DC | PRN

## 2018-10-09 MED ORDER — OXYCODONE-ACETAMINOPHEN 5-325 MG PO TABS
1.0000 | ORAL_TABLET | Freq: Three times a day (TID) | ORAL | Status: DC | PRN
  Administered 2018-10-10 – 2018-10-11 (×3): 2 via ORAL
  Filled 2018-10-09 (×3): qty 2

## 2018-10-09 MED ORDER — GABAPENTIN 100 MG PO CAPS
200.0000 mg | ORAL_CAPSULE | Freq: Two times a day (BID) | ORAL | Status: DC
  Administered 2018-10-09: 200 mg via ORAL
  Filled 2018-10-09: qty 2

## 2018-10-09 MED ORDER — LEVALBUTEROL HCL 0.63 MG/3ML IN NEBU: 0.6300 mg | INHALATION_SOLUTION | Freq: Four times a day (QID) | RESPIRATORY_TRACT | Status: DC | PRN

## 2018-10-09 MED ORDER — ALLOPURINOL 300 MG PO TABS
300.0000 mg | ORAL_TABLET | Freq: Every day | ORAL | Status: DC
  Administered 2018-10-09 – 2018-10-12 (×4): 300 mg via ORAL
  Filled 2018-10-09 (×5): qty 1

## 2018-10-09 MED ORDER — OXYCODONE-ACETAMINOPHEN 5-325 MG PO TABS
2.0000 | ORAL_TABLET | Freq: Once | ORAL | Status: AC
  Administered 2018-10-09: 2 via ORAL
  Filled 2018-10-09: qty 2

## 2018-10-09 NOTE — Progress Notes (Signed)
I have spoken w/ Dr. Irene Limbo.  He gave ok for tx to proceed w/ tx today w/ labs from 09/28/18 and no urine pregnancy result today yet.  Pt has "0% of pregnancy" per MD. Kennith Center, Pharm.D., CPP 10/09/2018@4 :30 PM

## 2018-10-09 NOTE — Progress Notes (Signed)
Patient on plan of care prior to pathways. 

## 2018-10-09 NOTE — Progress Notes (Signed)
START OFF PATHWAY REGIMEN - Lymphoma and CLL   [id number] (CIV Ifosfamide) q21 Days (Inpatient):   A cycle is every 21 days:     Etoposide      Carboplatin      Ifosfamide      [place]      [id number]   **Always confirm dose/schedule in your pharmacy ordering system**  Patient Characteristics: Classical Hodgkin Lymphoma, Third Line or Relapse After Autologous Transplant Disease Type: Not Applicable Disease Type: Not Applicable Disease Type: Classical Hodgkin Lymphoma Line of therapy: Third Line or Relapse After Autologous Transplant [place] Stage: IVBE Intent of Therapy: Curative Intent, Discussed with Patient

## 2018-10-09 NOTE — Progress Notes (Signed)
HEMATOLOGY/ONCOLOGY CONSULTATION NOTE  Date of Service: 10/09/2018  Patient Care Team: Clinic, General Medical as PCP - General (Family Medicine)  CHIEF COMPLAINTS/PURPOSE OF CONSULTATION:  Refractory Mixed cellularity Hodgkin's Lymphoma  HISTORY OF PRESENTING ILLNESS:   Heidi [name] is a  32 y.o. female who has been admitted today to begin C1 ICE to treat her refractory mixed cellularity Hodgkin's lymphoma. The patient's last visit with [place] was on 09/28/18. The pt reports that she is doing well overall.   The pt reports that she has not developed any new concerns in the interim, and denies any constitutional symptoms. She notes that her breathing has been stable and her shingles has continued to resolve.   In conversation about the possibility of infertility due to ICE regimen, the pt again notes that she is not planning on having any more children and would not like additional interventions like Lupron.  Urine pregnancy test neg today.  Lab results today (10/09/18) reviewed.  Her left chest wall shingles rash has crusted over but still notes significant pain.  On review of systems, pt reports stable breathing, some cough, and denies fevers, chills, night sweats, abdominal pains, leg swelling, and any other symptoms.   MEDICAL HISTORY:  Past Medical History:  Diagnosis Date  . ARDS (adult respiratory distress syndrome) ([place])   . Asthma   . Hodgkin lymphoma ([place])   . Hypertension     SURGICAL HISTORY: Past Surgical History:  Procedure Laterality Date  . AXILLARY LYMPH NODE BIOPSY Right 03/19/2016   Procedure: AXILLARY LYMPH NODE BIOPSY;  Surgeon: Armandina Gemma, MD;  Location: [facility];  Service: General;  Laterality: Right;  . IR GENERIC HISTORICAL  12/22/2016   IR FLUORO GUIDE PORT INSERTION LEFT 12/22/2016 [facility]  . IR GENERIC HISTORICAL  12/22/2016   IR US GUIDE VASC ACCESS LEFT 12/22/2016 [facility]  . IR GENERIC HISTORICAL  12/22/2016   IR CV LINE  INJECTION 12/22/2016 [facility]  . IR GENERIC HISTORICAL  12/22/2016   IR REMOVAL TUN ACCESS W/ PORT W/O FL MOD SED 12/22/2016 [facility]  . VIDEO BRONCHOSCOPY Bilateral 11/26/2016   Procedure: VIDEO BRONCHOSCOPY WITH FLUORO;  Surgeon: Rigoberto Noel, MD;  Location: [facility];  Service: Cardiopulmonary;  Laterality: Bilateral;    SOCIAL HISTORY: Social History   Socioeconomic History  . Marital status: Single    Spouse name: Not on file  . Number of children: Not on file  . Years of education: Not on file  . Highest education level: Not on file  Occupational History  . Not on file  Social Needs  . Financial resource strain: Not on file  . Food insecurity:    Worry: Not on file    Inability: Not on file  . Transportation needs:    Medical: Not on file    Non-medical: Not on file  Tobacco Use  . Smoking status: Former Smoker    Packs/day: 0.50    Years: 15.00    Pack years: 7.50    Types: Cigarettes    Last attempt to quit: 03/27/2016    Years since quitting: 2.5  . Smokeless tobacco: Never Used  Substance and Sexual Activity  . Alcohol use: Yes    Comment: occasional now  . Drug use: Not on file  . Sexual activity: Not on file  Lifestyle  . Physical activity:    Days per week: Not on file    Minutes per session: Not on file  . Stress: Not  on file  Relationships  . Social connections:    Talks on phone: Not on file    Gets together: Not on file    Attends religious service: Not on file    Active member of club or organization: Not on file    Attends meetings of clubs or organizations: Not on file    Relationship status: Not on file  . Intimate partner violence:    Fear of current or ex partner: Not on file    Emotionally abused: Not on file    Physically abused: Not on file    Forced sexual activity: Not on file  Other Topics Concern  . Not on file  Social History Narrative  . Not on file    FAMILY HISTORY: No family history on file.  ALLERGIES:   has No Known Allergies.  MEDICATIONS:  Current Facility-Administered Medications  Medication Dose Route Frequency Provider Last Rate Last Dose  . enoxaparin (LOVENOX) injection 40 mg  40 mg Subcutaneous Q24H Brunetta Genera, MD      . gabapentin (NEURONTIN) capsule 200 mg  200 mg Oral BID Brunetta Genera, MD      . levalbuterol Penne Lash) nebulizer solution 0.63 mg  0.63 mg Inhalation Q6H PRN Brunetta Genera, MD      . lidocaine-prilocaine (EMLA) cream 1 application  1 application Topical PRN Brunetta Genera, MD      . valACYclovir (VALTREX) tablet 1,000 mg  1,000 mg Oral BID Brunetta Genera, MD        REVIEW OF SYSTEMS:    A 10+ POINT REVIEW OF SYSTEMS WAS OBTAINED including neurology, dermatology, psychiatry, cardiac, respiratory, lymph, extremities, GI, GU, Musculoskeletal, constitutional, breasts, reproductive, HEENT.  All pertinent positives are noted in the HPI.  All others are negative.   PHYSICAL EXAMINATION: ECOG PERFORMANCE STATUS: 1 - Symptomatic but completely ambulatory  . Vitals:   10/09/18 1541  BP: 130/76  Pulse: (!) 114  Resp: 20  Temp: 97.9 F (36.6 C)  SpO2: 95%   There were no vitals filed for this visit. .There is no height or weight on file to calculate BMI.  GENERAL:alert, in no acute distress and comfortable SKIN: no acute rashes, no significant lesions EYES: conjunctiva are pink and non-injected, sclera anicteric OROPHARYNX: MMM, no exudates, no oropharyngeal erythema or ulceration NECK: supple, no JVD LYMPH:  no palpable lymphadenopathy in the cervical, axillary or inguinal regions LUNGS: diffuse crackles and rhonchi  HEART: regular rate & rhythm ABDOMEN:  normoactive bowel sounds , non tender, not distended. No palpable hepatosplenomegaly.  Extremity: no pedal edema PSYCH: alert & oriented x 3 with fluent speech NEURO: no focal motor/sensory deficits   LABORATORY DATA:  I have reviewed the data as listed  . CBC Latest  Ref Rng & Units 10/09/2018 09/28/2018 08/31/2018  WBC 4.0 - 10.5 K/uL 6.4 6.2 16.8(H)  Hemoglobin 12.0 - 15.0 g/dL 11.0(L) 11.7 12.8  Hematocrit 36.0 - 46.0 % 34.6(L) 35.2 38.7  Platelets 150 - 400 K/uL 505(H) 460(H) 481(H)    . CMP Latest Ref Rng & Units 10/09/2018 09/28/2018 08/31/2018  Glucose 70 - 99 mg/dL 147(H) 72 182(H)  BUN 6 - 20 mg/dL 8 11 4(L)  Creatinine 0.44 - 1.00 mg/dL 0.58 0.80 0.71  Sodium 135 - 145 mmol/L 138 141 134(L)  Potassium 3.5 - 5.1 mmol/L 3.2(L) 4.8 3.3(L)  Chloride 98 - 111 mmol/L 100 103 93(L)  CO2 22 - 32 mmol/L 28 30 29   Calcium 8.9 - 10.3  mg/dL 8.8(L) 9.1 8.5(L)  Total Protein 6.5 - 8.1 g/dL 6.0(L) 6.6 5.3(L)  Total Bilirubin 0.3 - 1.2 mg/dL 0.3 0.3 0.6  Alkaline Phos 38 - 126 U/L 106 118 103  AST 15 - 41 U/L 17 12(L) 13(L)  ALT 0 - 44 U/L 11 9 7    Component     Latest Ref Rng & Units 10/09/2018  Retic Ct Pct     0.4 - 3.1 % 1.6  RBC.     3.87 - 5.11 MIL/uL 3.65 (L)  Retic Count, Absolute     19.0 - 186.0 K/uL 58.0  Immature Retic Fract     2.3 - 15.9 % 18.3 (H)  Sed Rate     0 - 22 mm/hr 25 (H)  Preg Test, Ur     NEGATIVE NEGATIVE    RADIOGRAPHIC STUDIES: I have personally reviewed the radiological images as listed and agreed with the findings in the report. Nm Pet Image Restag (ps) Skull Base To Thigh  Result Date: 09/26/2018 CLINICAL DATA:  Subsequent treatment strategy for Hodgkin's lymphoma. EXAM: NUCLEAR MEDICINE PET SKULL BASE TO THIGH TECHNIQUE: 6.7 mCi F-18 FDG was injected intravenously. Full-ring PET imaging was performed from the skull base to thigh after the radiotracer. CT data was obtained and used for attenuation correction and anatomic localization. Fasting blood glucose: 71 mg/dl COMPARISON:  09/26/2017 FINDINGS: Mediastinal blood pool activity: SUV max 1.64 Liver activity: SUV max 1.94 NECK: No hypermetabolic lymph nodes in the neck. Incidental CT findings: none CHEST: For left lateral supraclavicular lymph node measures 1.2 cm  with SUV max of 7.8. New from previous exam. Slightly more medial there is a new left supraclavicular lymph node measuring 0.9 cm with SUV max of 4.87. No hypermetabolic axillary lymph nodes. No hypermetabolic mediastinal or hilar lymph nodes. Significant interval progression of bilateral hypermetabolic areas of nodularity and airspace consolidation. Right lower lobe area of airspace consolidation measures 8.5 cm with SUV max of 14.2. New from previous exam. Within the left lung there is a area of dense consolidation measuring 5.6 cm with SUV max of 9.6. New from previous exam. Right middle lobe consolidation measures approximately 4.9 cm and has an SUV max of 9.6. Incidental CT findings: Heart size appears enlarged. No pericardial effusion identified. ABDOMEN/PELVIS: No abnormal radiotracer uptake within the liver, pancreas, and spleen. The adrenal glands are unremarkable. Multiple new hypermetabolic left retroperitoneal lymph nodes. Index lymph node conglomeration measures 3.4 cm with SUV max 10.96. Multiple new hypermetabolic pelvic lymph nodes identified. Nodal conglomeration just below the bifurcation on the left measures 3.3 cm with SUV max of 11.04. New left external iliac node measures 1.7 cm with SUV max of 12.93. New hypermetabolic right common iliac node measures 9 mm and has an SUV max of 5.47. Incidental CT findings: Small volume of ascites is identified. New from previous exam. SKELETON: Multifocal hypermetabolic bone lesions are identified, new from previous exam. Index lesion within the proximal left humerus has an SUV max of 3.36. Index lesion within the proximal left femur has an SUV max of 4.03. Right iliac bone lesion has an SUV max of 4.06. Within the left iliac wing there is a lesion with SUV max of 5.42. Incidental CT findings: none IMPRESSION: 1. Imaging findings compatible with recurrence of disease. 2. Multiple new large areas of hypermetabolic nodularity and airspace consolidation within  both lungs which is presumed to represent pulmonary involvement by lymphoma. Deauville criteria 5. 3. New hypermetabolic left supraclavicular, left retroperitoneal, and bilateral  pelvic lymph nodes. Deauville criteria 5. 4. Multifocal hypermetabolic osseous lesions.  Deauville criteria 5. 5. Small volume of ascites, new. Electronically Signed   By: Kerby Moors M.D.   On: 09/26/2018 14:56    ASSESSMENT & PLAN:  32 y.o. female with  #1 Refractory/Progressive Mixed cellularity Hodgkin's lymphoma IV B E with extensive lymphadenopathy including right axillary, mediastinal and upper retroperitoneal and now biopsy proven pulmonary involvement. She was noted to have significant constitutional symptoms including significant weight loss, fevers chills and some night sweats. HIV negative Hepatitis C and hepatitis B serologies negative. Echo with normal ejection fraction. Patient was treated with 5 cycles of AVD (Bleomycin held due to poor DLCO 37% and ongoing smoking). Multiple avoidable treatment delays due to the patient's noncompliance with follow-up for avoidable reasons. She has been counseled repeatedly that this would increase the likelihood of unfavorable outcome.  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 S/p 6 cycles of 2nd line treatment with Bendamustine/Brentuximab And 1 cycle of Bretuximab alone  -PET/CT scan results from 04/14/2017 were discussed in details. She appears to have some persistent disease in her right lung at Deauville 5. It is difficult to say if this is recurrent or persistent disease since the patient failed to follow-up on multiple scheduled PET/CT scans in the early part and prior to her second line treatment.  09/26/18 PET/CT revealed Imaging findings compatible with recurrence of disease. 2. Multiple new large areas of hypermetabolic nodularity and airspace consolidation within both lungs which is presumed to represent pulmonary  involvement by lymphoma. Deauville criteria 5. 3. New hypermetabolic left supraclavicular, left retroperitoneal, and bilateral pelvic lymph nodes. Deauville criteria 5. 4. Multifocal hypermetabolic osseous lesions. Deauville criteria 5. 5. Small volume of ascites, new.   #2 s/p hypoxic respiratory failure with dense right lung consolidation and left upper lobe consolidation with SVC syndrome. Patient has completed palliative radiation to the right lung mass causing SVC compression and has had her port removed. Is on anticoagulation with Xarelto for possible thrombus. 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  #4 Non compliance with clinic and treatment followup. Missed 2nd dose of bendamustine with C2. Has missed multiple appointment for her PET/CT and missed appointment at [facility] for consideration of Transplant and still has not re-scheduled despite multiple reminders..  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   -Discussed the possibility of infertility due to ICE regimen and the possible interventions available, which the pt would not prefer to take. She notes she is not planning to have any more children.  -Discussed and reviewed the medications of ICE and recommended that the pt ensure that she stays well hydrated -Discussed that if the pt develops any confusion, vision changes, imbalance or  other cognitive/nuerological changes she is to let us know immediately as this would be concerning for CNS toxicity from Ifosfamide which the pt understands and agrees to  doing -[place]  -Will continue Valtrex for shingles outbreak has crusted over. -outpatient neulasta shot as per orders. -patient has outpatient appointment at [facility] on 10/23/2018.  #7 DVT Prophylaxis - lovenox   All of the patients questions were answered with apparent satisfaction. The patient knows to call the clinic with any problems, questions or concerns.  The total time spent in the appt was 70 minutes and more than 50% was on counseling and direct patient cares and co-ordination of cares with pharmacist.    Sullivan Lone MD [place] AAHIVMS [facility] Hematology/Oncology [facility]  (Office):       [phone] (Work cell):  [phone] (Fax):           [phone]  10/09/2018 4:22 PM  I, [place], am acting as a Education administrator for Dr. Irene Limbo  .I have reviewed the above documentation for accuracy and completeness, and I agree with the above. Sullivan Lone MD MS

## 2018-10-10 DIAGNOSIS — B029 Zoster without complications: Secondary | ICD-10-CM

## 2018-10-10 LAB — CBC
HEMATOCRIT: 36.7 % (ref 36.0–46.0)
HEMOGLOBIN: 11.7 g/dL — AB (ref 12.0–15.0)
MCH: 30.3 pg (ref 26.0–34.0)
MCHC: 31.9 g/dL (ref 30.0–36.0)
MCV: 95.1 fL (ref 80.0–100.0)
NRBC: 0 % (ref 0.0–0.2)
Platelets: 552 10*3/uL — ABNORMAL HIGH (ref 150–400)
RBC: 3.86 MIL/uL — ABNORMAL LOW (ref 3.87–5.11)
RDW: 13.9 % (ref 11.5–15.5)
WBC: 7.6 10*3/uL (ref 4.0–10.5)

## 2018-10-10 LAB — BASIC METABOLIC PANEL
Anion gap: 11 (ref 5–15)
BUN: 7 mg/dL (ref 6–20)
CHLORIDE: 100 mmol/L (ref 98–111)
CO2: 28 mmol/L (ref 22–32)
Calcium: 8.9 mg/dL (ref 8.9–10.3)
Creatinine, Ser: 0.62 mg/dL (ref 0.44–1.00)
GFR calc non Af Amer: >60 mL/min (ref >60)
Glucose, Bld: 129 mg/dL — ABNORMAL HIGH (ref 70–99)
POTASSIUM: 4.4 mmol/L (ref 3.5–5.1)
Sodium: 139 mmol/L (ref 135–145)

## 2018-10-10 MED ORDER — DIPHENHYDRAMINE HCL 25 MG PO CAPS
25.0000 mg | ORAL_CAPSULE | Freq: Four times a day (QID) | ORAL | Status: DC | PRN
  Administered 2018-10-10 – 2018-10-11 (×3): 25 mg via ORAL
  Filled 2018-10-10 (×3): qty 1

## 2018-10-10 MED ORDER — SODIUM CHLORIDE 0.9 % IV SOLN
Freq: Once | INTRAVENOUS | Status: AC
  Administered 2018-10-10: 19:00:00 via INTRAVENOUS
  Filled 2018-10-10: qty 169

## 2018-10-10 MED ORDER — SODIUM CHLORIDE 0.9 % IV SOLN
608.5000 mg | Freq: Once | INTRAVENOUS | Status: AC
  Administered 2018-10-10: 610 mg via INTRAVENOUS
  Filled 2018-10-10: qty 61

## 2018-10-10 MED ORDER — SODIUM CHLORIDE 0.9 % IV SOLN
100.0000 mg/m2 | Freq: Once | INTRAVENOUS | Status: AC
  Administered 2018-10-10: 170 mg via INTRAVENOUS
  Filled 2018-10-10: qty 8.5

## 2018-10-10 MED ORDER — GABAPENTIN 300 MG PO CAPS: 300.0000 mg | ORAL_CAPSULE | Freq: Two times a day (BID) | ORAL | Status: DC

## 2018-10-10 MED ORDER — SODIUM CHLORIDE 0.9 % IV SOLN
Freq: Once | INTRAVENOUS | Status: AC
  Administered 2018-10-10: 8 mg via INTRAVENOUS
  Filled 2018-10-10: qty 8

## 2018-10-10 NOTE — Progress Notes (Signed)
HEMATOLOGY/ONCOLOGY INPATIENT PROGRESS NOTE  Date of Service: 10/10/2018  Inpatient Attending: .Brunetta Genera, MD   SUBJECTIVE:   Heidi [name] reports that she is doing well overall.   The pt notes that she has been itching and would like Benadryl.  The pt reports that she has not felt any other new concerns today. She has been eating well, sleeping well and staying hydrated. The pt denies any difficulty breathing and continues on oxygen.   Lab results today (10/10/18) of CBC and BMP is as follows: all values are WNL except for RBC at 3.86, HGB at 11.7, PLT at 552k, Glucose at 129.  On review of systems, pt reports sleeping well, eating well, moving her bowels well, itching, and denies fevers, chills, night sweats, problems breathing, abdominal pains, leg swelling, and any other symptoms.   OBJECTIVE:  NAD  PHYSICAL EXAMINATION: . Vitals:   10/09/18 2157 10/09/18 2313 10/10/18 0500 10/10/18 1317  BP: 117/66  120/68 113/78  Pulse: (!) 119  96 74  Resp: 17  18 16   Temp: 98.5 F (36.9 C)  98.4 F (36.9 C) 97.9 F (36.6 C)  TempSrc: Oral  Oral Oral  SpO2: 90% 98% 97% 100%    GENERAL:alert, in no acute distress and comfortable SKIN: skin color, texture, turgor are normal, no rashes or significant lesions EYES: normal, conjunctiva are pink and non-injected, sclera clear OROPHARYNX:no exudate, no erythema and lips, buccal mucosa, and tongue normal  NECK: supple, no JVD, thyroid normal size, non-tender, without nodularity LYMPH:  no palpable lymphadenopathy in the cervical, axillary or inguinal LUNGS:b/l scattered fine crackles and rhonci (unchanged) HEART: regular rate & rhythm,  no murmurs and no lower extremity edema ABDOMEN: abdomen soft, non-tender, normoactive bowel sounds  Musculoskeletal: no cyanosis of digits and no clubbing  PSYCH: alert & oriented x 3 with fluent speech NEURO: no focal motor/sensory deficits  MEDICAL HISTORY:  Past Medical  History:  Diagnosis Date  . ARDS (adult respiratory distress syndrome) ([place])   . Asthma   . Hodgkin lymphoma ([place])   . Hypertension     SURGICAL HISTORY: Past Surgical History:  Procedure Laterality Date  . AXILLARY LYMPH NODE BIOPSY Right 03/19/2016   Procedure: AXILLARY LYMPH NODE BIOPSY;  Surgeon: Armandina Gemma, MD;  Location: [facility];  Service: General;  Laterality: Right;  . IR GENERIC HISTORICAL  12/22/2016   IR FLUORO GUIDE PORT INSERTION LEFT 12/22/2016 [facility]  . IR GENERIC HISTORICAL  12/22/2016   IR US GUIDE VASC ACCESS LEFT 12/22/2016 [facility]  . IR GENERIC HISTORICAL  12/22/2016   IR CV LINE INJECTION 12/22/2016 [facility]  . IR GENERIC HISTORICAL  12/22/2016   IR REMOVAL TUN ACCESS W/ PORT W/O FL MOD SED 12/22/2016 [facility]  . VIDEO BRONCHOSCOPY Bilateral 11/26/2016   Procedure: VIDEO BRONCHOSCOPY WITH FLUORO;  Surgeon: Rigoberto Noel, MD;  Location: [facility];  Service: Cardiopulmonary;  Laterality: Bilateral;    SOCIAL HISTORY: Social History   Socioeconomic History  . Marital status: Single    Spouse name: Not on file  . Number of children: Not on file  . Years of education: Not on file  . Highest education level: Not on file  Occupational History  . Not on file  Social Needs  . Financial resource strain: Not on file  . Food insecurity:    Worry: Not on file    Inability: Not on file  . Transportation needs:    Medical: Not on file  Non-medical: Not on file  Tobacco Use  . Smoking status: Former Smoker    Packs/day: 0.50    Years: 15.00    Pack years: 7.50    Types: Cigarettes    Last attempt to quit: 03/27/2016    Years since quitting: 2.5  . Smokeless tobacco: Never Used  Substance and Sexual Activity  . Alcohol use: Yes    Comment: occasional now  . Drug use: Not on file  . Sexual activity: Not on file  Lifestyle  . Physical activity:    Days per week: Not on file    Minutes per session: Not on file  . Stress: Not on  file  Relationships  . Social connections:    Talks on phone: Not on file    Gets together: Not on file    Attends religious service: Not on file    Active member of club or organization: Not on file    Attends meetings of clubs or organizations: Not on file    Relationship status: Not on file  . Intimate partner violence:    Fear of current or ex partner: Not on file    Emotionally abused: Not on file    Physically abused: Not on file    Forced sexual activity: Not on file  Other Topics Concern  . Not on file  Social History Narrative  . Not on file    FAMILY HISTORY: History reviewed. No pertinent family history.  ALLERGIES:  has No Known Allergies.  MEDICATIONS:  Scheduled Meds: . allopurinol  300 mg Oral Daily  . CARBOplatin  610 mg Intravenous Once  . enoxaparin (LOVENOX) injection  40 mg Subcutaneous Q24H  . etoposide  100 mg/m2 (Treatment Plan Recorded) Intravenous Once  . ifosfamide/mesna (IFEX/MESNEX) CHEMO for Inpatient CI in 1000 ml   Intravenous Once  . valACYclovir  1,000 mg Oral BID   Continuous Infusions: . sodium chloride 20 mL/hr at 10/10/18 0337   PRN Meds:.diphenhydrAMINE, oxyCODONE-acetaminophen, sodium chloride flush  REVIEW OF SYSTEMS:    10 Point review of Systems was done is negative except as noted above.   LABORATORY DATA:  I have reviewed the data as listed  . CBC Latest Ref Rng & Units 10/10/2018 10/09/2018 09/28/2018  WBC 4.0 - 10.5 K/uL 7.6 6.4 6.2  Hemoglobin 12.0 - 15.0 g/dL 11.7(L) 11.0(L) 11.7  Hematocrit 36.0 - 46.0 % 36.7 34.6(L) 35.2  Platelets 150 - 400 K/uL 552(H) 505(H) 460(H)    . CMP Latest Ref Rng & Units 10/10/2018 10/09/2018 09/28/2018  Glucose 70 - 99 mg/dL 129(H) 147(H) 72  BUN 6 - 20 mg/dL 7 8 11   Creatinine 0.44 - 1.00 mg/dL 0.62 0.58 0.80  Sodium 135 - 145 mmol/L 139 138 141  Potassium 3.5 - 5.1 mmol/L 4.4 3.2(L) 4.8  Chloride 98 - 111 mmol/L 100 100 103  CO2 22 - 32 mmol/L 28 28 30   Calcium 8.9 - 10.3  mg/dL 8.9 8.8(L) 9.1  Total Protein 6.5 - 8.1 g/dL - 6.0(L) 6.6  Total Bilirubin 0.3 - 1.2 mg/dL - 0.3 0.3  Alkaline Phos 38 - 126 U/L - 106 118  AST 15 - 41 U/L - 17 12(L)  ALT 0 - 44 U/L - 11 9     RADIOGRAPHIC STUDIES: I have personally reviewed the radiological images as listed and agreed with the findings in the report. Nm Pet Image Restag (ps) Skull Base To Thigh  Result Date: 09/26/2018 CLINICAL DATA:  Subsequent treatment strategy for Hodgkin's lymphoma.  EXAM: NUCLEAR MEDICINE PET SKULL BASE TO THIGH TECHNIQUE: 6.7 mCi F-18 FDG was injected intravenously. Full-ring PET imaging was performed from the skull base to thigh after the radiotracer. CT data was obtained and used for attenuation correction and anatomic localization. Fasting blood glucose: 71 mg/dl COMPARISON:  09/26/2017 FINDINGS: Mediastinal blood pool activity: SUV max 1.64 Liver activity: SUV max 1.94 NECK: No hypermetabolic lymph nodes in the neck. Incidental CT findings: none CHEST: For left lateral supraclavicular lymph node measures 1.2 cm with SUV max of 7.8. New from previous exam. Slightly more medial there is a new left supraclavicular lymph node measuring 0.9 cm with SUV max of 4.87. No hypermetabolic axillary lymph nodes. No hypermetabolic mediastinal or hilar lymph nodes. Significant interval progression of bilateral hypermetabolic areas of nodularity and airspace consolidation. Right lower lobe area of airspace consolidation measures 8.5 cm with SUV max of 14.2. New from previous exam. Within the left lung there is a area of dense consolidation measuring 5.6 cm with SUV max of 9.6. New from previous exam. Right middle lobe consolidation measures approximately 4.9 cm and has an SUV max of 9.6. Incidental CT findings: Heart size appears enlarged. No pericardial effusion identified. ABDOMEN/PELVIS: No abnormal radiotracer uptake within the liver, pancreas, and spleen. The adrenal glands are unremarkable. Multiple new  hypermetabolic left retroperitoneal lymph nodes. Index lymph node conglomeration measures 3.4 cm with SUV max 10.96. Multiple new hypermetabolic pelvic lymph nodes identified. Nodal conglomeration just below the bifurcation on the left measures 3.3 cm with SUV max of 11.04. New left external iliac node measures 1.7 cm with SUV max of 12.93. New hypermetabolic right common iliac node measures 9 mm and has an SUV max of 5.47. Incidental CT findings: Small volume of ascites is identified. New from previous exam. SKELETON: Multifocal hypermetabolic bone lesions are identified, new from previous exam. Index lesion within the proximal left humerus has an SUV max of 3.36. Index lesion within the proximal left femur has an SUV max of 4.03. Right iliac bone lesion has an SUV max of 4.06. Within the left iliac wing there is a lesion with SUV max of 5.42. Incidental CT findings: none IMPRESSION: 1. Imaging findings compatible with recurrence of disease. 2. Multiple new large areas of hypermetabolic nodularity and airspace consolidation within both lungs which is presumed to represent pulmonary involvement by lymphoma. Deauville criteria 5. 3. New hypermetabolic left supraclavicular, left retroperitoneal, and bilateral pelvic lymph nodes. Deauville criteria 5. 4. Multifocal hypermetabolic osseous lesions.  Deauville criteria 5. 5. Small volume of ascites, new. Electronically Signed   By: Kerby Moors M.D.   On: 09/26/2018 14:56    ASSESSMENT & PLAN:  32 y.o. 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 She was noted to have significant constitutional symptoms including significant weight loss, fevers chills and some night sweats. HIV negative Hepatitis C and hepatitis B serologies negative. Echo with normal ejection fraction. Patient was  treated with 5 cycles of AVD (Bleomycin held due to poor DLCO 37% and ongoing smoking). Multiple avoidable treatment delays due to the patient's noncompliance with follow-up for avoidable reasons. She has been counseled repeatedly that this would increase the likelihood of unfavorable outcome.  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 S/p 6 cycles of 2nd line treatment with Bendamustine/Brentuximab And 1 cycle of Bretuximab alone  -PET/CT scan results from 04/14/2017 were discussed  in details. She appears to have some persistent disease in her right lung at Deauville 5. It is difficult to say if this is recurrent or persistent disease since the patient failed to follow-up on multiple scheduled PET/CT scans in the early part and prior to her second line treatment.  09/26/18 PET/CT revealed Imaging findings compatible with recurrence of disease. 2. Multiple new large areas of hypermetabolic nodularity and airspace consolidation within both lungs which is presumed to represent pulmonary involvement by lymphoma. Deauville criteria 5. 3. New hypermetabolic left supraclavicular, left retroperitoneal, and bilateral pelvic lymph nodes. Deauville criteria 5. 4. Multifocal hypermetabolic osseous lesions. Deauville criteria 5. 5. Small volume of ascites, new.   #2 s/p hypoxic respiratory failurewith dense right lung consolidation and left upper lobe consolidation with SVC syndrome. Patient has completed palliative radiation to the right lung mass causing SVC compression and has had her port removed. 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  #4 Non compliancewith clinic and treatment followup. Missed 2nd dose of bendamustine with C2. Has missed multiple appointment for her PET/CT and missed appointment at [facility] for consideration  of Transplant.  Pt was lost to follow up after July 2018 and returned on 08/31/18 Discussed the patient's goals of care and the pt noted that she is ready to begin treatment again and maintain compliance and follow ups    #5 h/o Grade 1 neuropathyfrom Brentuximab-Vedotin - resolved  #6 Shingles outbreak - curently - crusted over  PLAN:  -Discussed pt labwork today, 10/10/18; blood counts and chemistries are stable  -The pt has no prohibitive toxicities from continuing C1 ICE at this time.    -Benadryl for itching -Continue eating well, staying hydrated, and ambulating periodically -Advised that pt let staff know if she develops any new concerns of cognitive differences while on ICE  -again reinforced the recommendation that the pt ensure that she stays well hydrated and empties her bladder on a regular basis. -Discussed that if the pt develops any confusion, vision changes, imbalance or other cognitive/nuerological changes she is to let us know immediately as this would be concerning for CNS toxicity from Ifosfamide which the pt understands and agrees to doing -[place] for ifosfamide toxicity mitigation.  Will need to continue good IV hydration day 3 as well. -Will continue Valtrex for shingles outbreak which has crusted over. -outpatient neulasta shot as per orders. -patient has outpatient appointment at [facility] on 10/23/2018.  #7 DVT Prophylaxis - lovenox    The total time spent in the appt was 25 minutes and more than 50% was on counseling and direct patient cares.    Sullivan Lone MD MS AAHIVMS [facility] Hematology/Oncology Physician [facility]  (Office):       [phone] (Work cell):  [phone] (Fax):           [phone]  10/10/2018 5:04 PM   I, Baldwin [place], am acting as a scribe for Dr. Irene Limbo  .I have reviewed the above documentation for accuracy and completeness, and I agree with the above. Sullivan Lone MD MS

## 2018-10-10 NOTE — Progress Notes (Signed)
Chemo calculations verified with Drue Dun RN

## 2018-10-10 NOTE — H&P (Signed)
HEMATOLOGY/ONCOLOGY H&P NOTE  Date of Service: 10/10/2018  Patient Care Team: Clinic, General Medical as PCP - General (Family Medicine)  CHIEF COMPLAINTS/PURPOSE OF CONSULTATION:  Refractory Mixed cellularity Hodgkin's Lymphoma  HISTORY OF PRESENTING ILLNESS:   Heidi [name] is a  32 y.o. female who has been admitted today to begin C1 ICE to treat her refractory mixed cellularity Hodgkin's lymphoma. The patient's last visit with [place] was on 09/28/18. The pt reports that she is doing well overall.   The pt reports that she has not developed any new concerns in the interim, and denies any constitutional symptoms. She notes that her breathing has been stable and her shingles has continued to resolve.   In conversation about the possibility of infertility due to ICE regimen, the pt again notes that she is not planning on having any more children and would not like additional interventions like Lupron.  Urine pregnancy test neg today.  Lab results today (10/09/18) reviewed.  Her left chest wall shingles rash has crusted over but still notes significant pain.  On review of systems, pt reports stable breathing, some cough, and denies fevers, chills, night sweats, abdominal pains, leg swelling, and any other symptoms.   MEDICAL HISTORY:  Past Medical History:  Diagnosis Date  . ARDS (adult respiratory distress syndrome) ([place])   . Asthma   . Hodgkin lymphoma ([place])   . Hypertension     SURGICAL HISTORY: Past Surgical History:  Procedure Laterality Date  . AXILLARY LYMPH NODE BIOPSY Right 03/19/2016   Procedure: AXILLARY LYMPH NODE BIOPSY;  Surgeon: Armandina Gemma, MD;  Location: [facility];  Service: General;  Laterality: Right;  . IR GENERIC HISTORICAL  12/22/2016   IR FLUORO GUIDE PORT INSERTION LEFT 12/22/2016 [facility]  . IR GENERIC HISTORICAL  12/22/2016   IR US GUIDE VASC ACCESS LEFT 12/22/2016 [facility]  . IR GENERIC HISTORICAL  12/22/2016   IR CV LINE INJECTION  12/22/2016 [facility]  . IR GENERIC HISTORICAL  12/22/2016   IR REMOVAL TUN ACCESS W/ PORT W/O FL MOD SED 12/22/2016 [facility]  . VIDEO BRONCHOSCOPY Bilateral 11/26/2016   Procedure: VIDEO BRONCHOSCOPY WITH FLUORO;  Surgeon: Rigoberto Noel, MD;  Location: [facility];  Service: Cardiopulmonary;  Laterality: Bilateral;    SOCIAL HISTORY: Social History   Socioeconomic History  . Marital status: Single    Spouse name: Not on file  . Number of children: Not on file  . Years of education: Not on file  . Highest education level: Not on file  Occupational History  . Not on file  Social Needs  . Financial resource strain: Not on file  . Food insecurity:    Worry: Not on file    Inability: Not on file  . Transportation needs:    Medical: Not on file    Non-medical: Not on file  Tobacco Use  . Smoking status: Former Smoker    Packs/day: 0.50    Years: 15.00    Pack years: 7.50    Types: Cigarettes    Last attempt to quit: 03/27/2016    Years since quitting: 2.5  . Smokeless tobacco: Never Used  Substance and Sexual Activity  . Alcohol use: Yes    Comment: occasional now  . Drug use: Not on file  . Sexual activity: Not on file  Lifestyle  . Physical activity:    Days per week: Not on file    Minutes per session: Not on file  . Stress: Not  on file  Relationships  . Social connections:    Talks on phone: Not on file    Gets together: Not on file    Attends religious service: Not on file    Active member of club or organization: Not on file    Attends meetings of clubs or organizations: Not on file    Relationship status: Not on file  . Intimate partner violence:    Fear of current or ex partner: Not on file    Emotionally abused: Not on file    Physically abused: Not on file    Forced sexual activity: Not on file  Other Topics Concern  . Not on file  Social History Narrative  . Not on file    FAMILY HISTORY: History reviewed. No pertinent family  history.  ALLERGIES:  has No Known Allergies.  MEDICATIONS:  Current Facility-Administered Medications  Medication Dose Route Frequency Provider Last Rate Last Dose  . 0.9 %  sodium chloride infusion   Intravenous Continuous Brunetta Genera, MD 20 mL/hr at 10/09/18 1840    . allopurinol (ZYLOPRIM) tablet 300 mg  300 mg Oral Daily Brunetta Genera, MD   300 mg at 10/09/18 1753  . enoxaparin (LOVENOX) injection 40 mg  40 mg Subcutaneous Q24H Brunetta Genera, MD      . gabapentin (NEURONTIN) capsule 300 mg  300 mg Oral BID Brunetta Genera, MD      . levalbuterol Penne Lash) nebulizer solution 0.63 mg  0.63 mg Inhalation Q6H PRN Brunetta Genera, MD      . lidocaine-prilocaine (EMLA) cream 1 application  1 application Topical PRN Brunetta Genera, MD      . oxyCODONE-acetaminophen (PERCOCET/ROXICET) 5-325 MG per tablet 1-2 tablet  1-2 tablet Oral Q8H PRN Nicholas Lose, MD      . sodium chloride flush (NS) 0.9 % injection 10-40 mL  10-40 mL Intracatheter PRN Brunetta Genera, MD      . valACYclovir Estell Harpin) tablet 1,000 mg  1,000 mg Oral BID Brunetta Genera, MD   1,000 mg at 10/09/18 2208    REVIEW OF SYSTEMS:    A 10+ POINT REVIEW OF SYSTEMS WAS OBTAINED including neurology, dermatology, psychiatry, cardiac, respiratory, lymph, extremities, GI, GU, Musculoskeletal, constitutional, breasts, reproductive, HEENT.  All pertinent positives are noted in the HPI.  All others are negative.   PHYSICAL EXAMINATION: ECOG PERFORMANCE STATUS: 1 - Symptomatic but completely ambulatory  . Vitals:   10/09/18 2157 10/09/18 2313  BP: 117/66   Pulse: (!) 119   Resp: 17   Temp: 98.5 F (36.9 C)   SpO2: 90% 98%   There were no vitals filed for this visit. .There is no height or weight on file to calculate BMI.  GENERAL:alert, in no acute distress and comfortable SKIN: no acute rashes, no significant lesions EYES: conjunctiva are pink and non-injected, sclera  anicteric OROPHARYNX: MMM, no exudates, no oropharyngeal erythema or ulceration NECK: supple, no JVD LYMPH:  no palpable lymphadenopathy in the cervical, axillary or inguinal regions LUNGS: diffuse crackles and rhonchi  HEART: regular rate & rhythm ABDOMEN:  normoactive bowel sounds , non tender, not distended. No palpable hepatosplenomegaly.  Extremity: no pedal edema PSYCH: alert & oriented x 3 with fluent speech NEURO: no focal motor/sensory deficits   LABORATORY DATA:  I have reviewed the data as listed  . CBC Latest Ref Rng & Units 10/09/2018 09/28/2018 08/31/2018  WBC 4.0 - 10.5 K/uL 6.4 6.2 16.8(H)  Hemoglobin 12.0 -  15.0 g/dL 11.0(L) 11.7 12.8  Hematocrit 36.0 - 46.0 % 34.6(L) 35.2 38.7  Platelets 150 - 400 K/uL 505(H) 460(H) 481(H)    . CMP Latest Ref Rng & Units 10/09/2018 09/28/2018 08/31/2018  Glucose 70 - 99 mg/dL 147(H) 72 182(H)  BUN 6 - 20 mg/dL 8 11 4(L)  Creatinine 0.44 - 1.00 mg/dL 0.58 0.80 0.71  Sodium 135 - 145 mmol/L 138 141 134(L)  Potassium 3.5 - 5.1 mmol/L 3.2(L) 4.8 3.3(L)  Chloride 98 - 111 mmol/L 100 103 93(L)  CO2 22 - 32 mmol/L 28 30 29   Calcium 8.9 - 10.3 mg/dL 8.8(L) 9.1 8.5(L)  Total Protein 6.5 - 8.1 g/dL 6.0(L) 6.6 5.3(L)  Total Bilirubin 0.3 - 1.2 mg/dL 0.3 0.3 0.6  Alkaline Phos 38 - 126 U/L 106 118 103  AST 15 - 41 U/L 17 12(L) 13(L)  ALT 0 - 44 U/L 11 9 7    Component     Latest Ref Rng & Units 10/09/2018  Retic Ct Pct     0.4 - 3.1 % 1.6  RBC.     3.87 - 5.11 MIL/uL 3.65 (L)  Retic Count, Absolute     19.0 - 186.0 K/uL 58.0  Immature Retic Fract     2.3 - 15.9 % 18.3 (H)  Sed Rate     0 - 22 mm/hr 25 (H)  Preg Test, Ur     NEGATIVE NEGATIVE    RADIOGRAPHIC STUDIES: I have personally reviewed the radiological images as listed and agreed with the findings in the report. Nm Pet Image Restag (ps) Skull Base To Thigh  Result Date: 09/26/2018 CLINICAL DATA:  Subsequent treatment strategy for Hodgkin's lymphoma. EXAM: NUCLEAR  MEDICINE PET SKULL BASE TO THIGH TECHNIQUE: 6.7 mCi F-18 FDG was injected intravenously. Full-ring PET imaging was performed from the skull base to thigh after the radiotracer. CT data was obtained and used for attenuation correction and anatomic localization. Fasting blood glucose: 71 mg/dl COMPARISON:  09/26/2017 FINDINGS: Mediastinal blood pool activity: SUV max 1.64 Liver activity: SUV max 1.94 NECK: No hypermetabolic lymph nodes in the neck. Incidental CT findings: none CHEST: For left lateral supraclavicular lymph node measures 1.2 cm with SUV max of 7.8. New from previous exam. Slightly more medial there is a new left supraclavicular lymph node measuring 0.9 cm with SUV max of 4.87. No hypermetabolic axillary lymph nodes. No hypermetabolic mediastinal or hilar lymph nodes. Significant interval progression of bilateral hypermetabolic areas of nodularity and airspace consolidation. Right lower lobe area of airspace consolidation measures 8.5 cm with SUV max of 14.2. New from previous exam. Within the left lung there is a area of dense consolidation measuring 5.6 cm with SUV max of 9.6. New from previous exam. Right middle lobe consolidation measures approximately 4.9 cm and has an SUV max of 9.6. Incidental CT findings: Heart size appears enlarged. No pericardial effusion identified. ABDOMEN/PELVIS: No abnormal radiotracer uptake within the liver, pancreas, and spleen. The adrenal glands are unremarkable. Multiple new hypermetabolic left retroperitoneal lymph nodes. Index lymph node conglomeration measures 3.4 cm with SUV max 10.96. Multiple new hypermetabolic pelvic lymph nodes identified. Nodal conglomeration just below the bifurcation on the left measures 3.3 cm with SUV max of 11.04. New left external iliac node measures 1.7 cm with SUV max of 12.93. New hypermetabolic right common iliac node measures 9 mm and has an SUV max of 5.47. Incidental CT findings: Small volume of ascites is identified. New from  previous exam. SKELETON: Multifocal hypermetabolic bone lesions  are identified, new from previous exam. Index lesion within the proximal left humerus has an SUV max of 3.36. Index lesion within the proximal left femur has an SUV max of 4.03. Right iliac bone lesion has an SUV max of 4.06. Within the left iliac wing there is a lesion with SUV max of 5.42. Incidental CT findings: none IMPRESSION: 1. Imaging findings compatible with recurrence of disease. 2. Multiple new large areas of hypermetabolic nodularity and airspace consolidation within both lungs which is presumed to represent pulmonary involvement by lymphoma. Deauville criteria 5. 3. New hypermetabolic left supraclavicular, left retroperitoneal, and bilateral pelvic lymph nodes. Deauville criteria 5. 4. Multifocal hypermetabolic osseous lesions.  Deauville criteria 5. 5. Small volume of ascites, new. Electronically Signed   By: Kerby Moors M.D.   On: 09/26/2018 14:56    ASSESSMENT & PLAN:  32 y.o. female with  #1 Refractory/Progressive Mixed cellularity Hodgkin's lymphoma IV B E with extensive lymphadenopathy including right axillary, mediastinal and upper retroperitoneal and now biopsy proven pulmonary involvement. She was noted to have significant constitutional symptoms including significant weight loss, fevers chills and some night sweats. HIV negative Hepatitis C and hepatitis B serologies negative. Echo with normal ejection fraction. Patient was treated with 5 cycles of AVD (Bleomycin held due to poor DLCO 37% and ongoing smoking). Multiple avoidable treatment delays due to the patient's noncompliance with follow-up for avoidable reasons. She has been counseled repeatedly that this would increase the likelihood of unfavorable outcome.  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 S/p 6 cycles of 2nd line treatment with Bendamustine/Brentuximab And 1 cycle of Bretuximab alone  -PET/CT scan results from  04/14/2017 were discussed in details. She appears to have some persistent disease in her right lung at Deauville 5. It is difficult to say if this is recurrent or persistent disease since the patient failed to follow-up on multiple scheduled PET/CT scans in the early part and prior to her second line treatment.  09/26/18 PET/CT revealed Imaging findings compatible with recurrence of disease. 2. Multiple new large areas of hypermetabolic nodularity and airspace consolidation within both lungs which is presumed to represent pulmonary involvement by lymphoma. Deauville criteria 5. 3. New hypermetabolic left supraclavicular, left retroperitoneal, and bilateral pelvic lymph nodes. Deauville criteria 5. 4. Multifocal hypermetabolic osseous lesions. Deauville criteria 5. 5. Small volume of ascites, new.   #2 s/p hypoxic respiratory failure with dense right lung consolidation and left upper lobe consolidation with SVC syndrome. Patient has completed palliative radiation to the right lung mass causing SVC compression and has had her port removed. Is on anticoagulation with Xarelto for possible thrombus. 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  #4 Non compliance with clinic and treatment followup. Missed 2nd dose of bendamustine with C2. Has missed multiple appointment for her PET/CT and missed appointment at [facility] for consideration of Transplant and still has not re-scheduled despite multiple reminders..  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   -Discussed the possibility of infertility due to ICE regimen and the possible interventions available, which the pt would not prefer to take. She notes she is not planning to have any more children.  -Discussed and reviewed the medications of ICE and recommended that the pt ensure that she stays well hydrated -Discussed that if the pt develops any confusion, vision changes, imbalance or other cognitive/nuerological changes she is to let us know immediately as this would be concerning for CNS toxicity from Ifosfamide which the pt understands and agrees to doing -[place]  -Will continue Valtrex for shingles outbreak has crusted over. -outpatient neulasta shot as per orders. -patient has outpatient appointment at [facility] on 10/23/2018.  #7 DVT Prophylaxis - lovenox   All of the patients questions were answered with apparent satisfaction. The patient knows to call the clinic with any problems, questions or concerns.  The total time spent in the appt was 70 minutes and more than 50% was on counseling and direct patient cares and co-ordination of cares with pharmacist.    Sullivan Lone MD [place] AAHIVMS [facility] [place] Hematology/Oncology Physician [facility]  (Office):       [phone] (Work cell):  [phone] (Fax):           [phone]  10/10/2018 12:29 AM  I, Baldwin [place], am acting as a scribe for Dr. Irene Limbo  .I have reviewed the above documentation for accuracy and completeness, and I agree with the above. Sullivan Lone MD MS

## 2018-10-11 ENCOUNTER — Telehealth: Payer: Self-pay | Admitting: Hematology

## 2018-10-11 LAB — BASIC METABOLIC PANEL
Anion gap: 7 (ref 5–15)
BUN: 7 mg/dL (ref 6–20)
CHLORIDE: 106 mmol/L (ref 98–111)
CO2: 28 mmol/L (ref 22–32)
Calcium: 8.5 mg/dL — ABNORMAL LOW (ref 8.9–10.3)
Creatinine, Ser: 0.5 mg/dL (ref 0.44–1.00)
GFR calc Af Amer: >60 mL/min (ref >60)
GFR calc non Af Amer: >60 mL/min (ref >60)
GLUCOSE: 120 mg/dL — AB (ref 70–99)
POTASSIUM: 4.2 mmol/L (ref 3.5–5.1)
SODIUM: 141 mmol/L (ref 135–145)

## 2018-10-11 LAB — CBC
HEMATOCRIT: 34.2 % — AB (ref 36.0–46.0)
HEMOGLOBIN: 10.7 g/dL — AB (ref 12.0–15.0)
MCH: 30.2 pg (ref 26.0–34.0)
MCHC: 31.3 g/dL (ref 30.0–36.0)
MCV: 96.6 fL (ref 80.0–100.0)
NRBC: 0 % (ref 0.0–0.2)
Platelets: 501 10*3/uL — ABNORMAL HIGH (ref 150–400)
RBC: 3.54 MIL/uL — ABNORMAL LOW (ref 3.87–5.11)
RDW: 13.9 % (ref 11.5–15.5)
WBC: 12.2 10*3/uL — AB (ref 4.0–10.5)

## 2018-10-11 MED ORDER — SODIUM CHLORIDE 0.9 % IV SOLN
INTRAVENOUS | Status: AC
  Administered 2018-10-11: 11:00:00 via INTRAVENOUS

## 2018-10-11 MED ORDER — ONDANSETRON HCL 8 MG PO TABS: 8.0000 mg | ORAL_TABLET | Freq: Two times a day (BID) | ORAL | 1 refills | Status: DC | PRN

## 2018-10-11 MED ORDER — SODIUM CHLORIDE 0.9 % IV SOLN
Freq: Once | INTRAVENOUS | Status: AC
  Administered 2018-10-11: 36 mg via INTRAVENOUS
  Filled 2018-10-11: qty 8

## 2018-10-11 MED ORDER — ALLOPURINOL 300 MG PO TABS: 300.0000 mg | ORAL_TABLET | Freq: Every day | ORAL | 2 refills | Status: DC

## 2018-10-11 MED ORDER — SODIUM CHLORIDE 0.9 % IV SOLN
100.0000 mg/m2 | Freq: Once | INTRAVENOUS | Status: AC
  Administered 2018-10-11: 170 mg via INTRAVENOUS
  Filled 2018-10-11: qty 8.5

## 2018-10-11 MED ORDER — PROCHLORPERAZINE MALEATE 10 MG PO TABS: 10.0000 mg | ORAL_TABLET | Freq: Four times a day (QID) | ORAL | 1 refills | Status: DC | PRN

## 2018-10-11 MED ORDER — LORAZEPAM 0.5 MG PO TABS: 0.5000 mg | ORAL_TABLET | Freq: Four times a day (QID) | ORAL | 0 refills | Status: DC | PRN

## 2018-10-11 NOTE — Progress Notes (Signed)
Calculation and dosage of etoposide verified by another chemo certified nurse.

## 2018-10-11 NOTE — Progress Notes (Signed)
HEMATOLOGY/ONCOLOGY INPATIENT PROGRESS NOTE  Date of Service: 10/11/2018  Inpatient Attending: .Brunetta Genera, MD   SUBJECTIVE:   Heidi [name] reports that she is doing well overall and has been tolerating treatment very well.   The pt reports that she has been urinating frequently, with clear urine, and denies any bladder pain or painful urination. The pt also notes that she has been eating well and has good energy levels. She denies problems breathing or nausea. She also denies any confusion or headaches or altered mental status.   Lab results today (10/11/18) of CBC and BMP is as follows: all values are WNL except for WBC at 12.2k, RBC at 3.54, HGB at 10.7, HCT at 34.2, PLT at 501k, Glucose at 120, Calcium at 8.5.  On review of systems, pt reports good energy levels, breathing well, eating well, staying well hydrated, and denies confusion, headaches, breathing problems, painful urination, and any other symptms.   OBJECTIVE:  NAD  PHYSICAL EXAMINATION: . Vitals:   10/10/18 0500 10/10/18 1317 10/10/18 2108 10/11/18 0547  BP: 120/68 113/78 123/80 125/87  Pulse: 96 74 71 66  Resp: 18 16 18 17   Temp: 98.4 F (36.9 C) 97.9 F (36.6 C) 98 F (36.7 C) 97.7 F (36.5 C)  TempSrc: Oral Oral Oral Oral  SpO2: 97% 100% 97% 97%   GENERAL:alert, in no acute distress and comfortable SKIN: no acute rashes, no significant lesions EYES: conjunctiva are pink and non-injected, sclera anicteric OROPHARYNX: MMM, no exudates, no oropharyngeal erythema or ulceration NECK: supple, no JVD LYMPH:  no palpable lymphadenopathy in the cervical, axillary or inguinal regions LUNGS: scattered fine crackles and rhonchi in right lower and mid zones  HEART: regular rate & rhythm ABDOMEN:  normoactive bowel sounds , non tender, not distended. No palpable hepatosplenomegaly.  Extremity: no pedal edema PSYCH: alert & oriented x 3 with fluent speech NEURO: no focal motor/sensory deficits    MEDICAL HISTORY:  Past Medical History:  Diagnosis Date  . ARDS (adult respiratory distress syndrome) ([place])   . Asthma   . Hodgkin lymphoma ([place])   . Hypertension     SURGICAL HISTORY: Past Surgical History:  Procedure Laterality Date  . AXILLARY LYMPH NODE BIOPSY Right 03/19/2016   Procedure: AXILLARY LYMPH NODE BIOPSY;  Surgeon: Armandina Gemma, MD;  Location: [facility];  Service: General;  Laterality: Right;  . IR GENERIC HISTORICAL  12/22/2016   IR FLUORO GUIDE PORT INSERTION LEFT 12/22/2016 [facility]  . IR GENERIC HISTORICAL  12/22/2016   IR US GUIDE VASC ACCESS LEFT 12/22/2016 [facility]  . IR GENERIC HISTORICAL  12/22/2016   IR CV LINE INJECTION 12/22/2016 [facility]  . IR GENERIC HISTORICAL  12/22/2016   IR REMOVAL TUN ACCESS W/ PORT W/O FL MOD SED 12/22/2016 [facility]  . VIDEO BRONCHOSCOPY Bilateral 11/26/2016   Procedure: VIDEO BRONCHOSCOPY WITH FLUORO;  Surgeon: Rigoberto Noel, MD;  Location: [facility];  Service: Cardiopulmonary;  Laterality: Bilateral;    SOCIAL HISTORY: Social History   Socioeconomic History  . Marital status: Single    Spouse name: Not on file  . Number of children: Not on file  . Years of education: Not on file  . Highest education level: Not on file  Occupational History  . Not on file  Social Needs  . Financial resource strain: Not on file  . Food insecurity:    Worry: Not on file    Inability: Not on file  . Transportation needs:  Medical: Not on file    Non-medical: Not on file  Tobacco Use  . Smoking status: Former Smoker    Packs/day: 0.50    Years: 15.00    Pack years: 7.50    Types: Cigarettes    Last attempt to quit: 03/27/2016    Years since quitting: 2.5  . Smokeless tobacco: Never Used  Substance and Sexual Activity  . Alcohol use: Yes    Comment: occasional now  . Drug use: Not on file  . Sexual activity: Not on file  Lifestyle  . Physical activity:    Days per week: Not on file    Minutes per  session: Not on file  . Stress: Not on file  Relationships  . Social connections:    Talks on phone: Not on file    Gets together: Not on file    Attends religious service: Not on file    Active member of club or organization: Not on file    Attends meetings of clubs or organizations: Not on file    Relationship status: Not on file  . Intimate partner violence:    Fear of current or ex partner: Not on file    Emotionally abused: Not on file    Physically abused: Not on file    Forced sexual activity: Not on file  Other Topics Concern  . Not on file  Social History Narrative  . Not on file    FAMILY HISTORY: History reviewed. No pertinent family history.  ALLERGIES:  has No Known Allergies.  MEDICATIONS:  Scheduled Meds: . allopurinol  300 mg Oral Daily  . enoxaparin (LOVENOX) injection  40 mg Subcutaneous Q24H  . etoposide  100 mg/m2 (Treatment Plan Recorded) Intravenous Once  . ifosfamide/mesna (IFEX/MESNEX) CHEMO for Inpatient CI in 1000 ml   Intravenous Once  . valACYclovir  1,000 mg Oral BID   Continuous Infusions: . sodium chloride 20 mL/hr at 10/11/18 0600  . sodium chloride 200 mL/hr at 10/11/18 1046  . ondansetron (ZOFRAN) with dexamethasone (DECADRON) IV     PRN Meds:.diphenhydrAMINE, oxyCODONE-acetaminophen, sodium chloride flush  REVIEW OF SYSTEMS:    A 10+ POINT REVIEW OF SYSTEMS WAS OBTAINED including neurology, dermatology, psychiatry, cardiac, respiratory, lymph, extremities, GI, GU, Musculoskeletal, constitutional, breasts, reproductive, HEENT.  All pertinent positives are noted in the HPI.  All others are negative.   LABORATORY DATA:  I have reviewed the data as listed  . CBC Latest Ref Rng & Units 10/11/2018 10/10/2018 10/09/2018  WBC 4.0 - 10.5 K/uL 12.2(H) 7.6 6.4  Hemoglobin 12.0 - 15.0 g/dL 10.7(L) 11.7(L) 11.0(L)  Hematocrit 36.0 - 46.0 % 34.2(L) 36.7 34.6(L)  Platelets 150 - 400 K/uL 501(H) 552(H) 505(H)    . CMP Latest Ref Rng & Units  10/11/2018 10/10/2018 10/09/2018  Glucose 70 - 99 mg/dL 120(H) 129(H) 147(H)  BUN 6 - 20 mg/dL 7 7 8   Creatinine 0.44 - 1.00 mg/dL 0.50 0.62 0.58  Sodium 135 - 145 mmol/L 141 139 138  Potassium 3.5 - 5.1 mmol/L 4.2 4.4 3.2(L)  Chloride 98 - 111 mmol/L 106 100 100  CO2 22 - 32 mmol/L 28 28 28   Calcium 8.9 - 10.3 mg/dL 8.5(L) 8.9 8.8(L)  Total Protein 6.5 - 8.1 g/dL - - 6.0(L)  Total Bilirubin 0.3 - 1.2 mg/dL - - 0.3  Alkaline Phos 38 - 126 U/L - - 106  AST 15 - 41 U/L - - 17  ALT 0 - 44 U/L - - 11  RADIOGRAPHIC STUDIES: I have personally reviewed the radiological images as listed and agreed with the findings in the report. Nm Pet Image Restag (ps) Skull Base To Thigh  Result Date: 09/26/2018 CLINICAL DATA:  Subsequent treatment strategy for Hodgkin's lymphoma. EXAM: NUCLEAR MEDICINE PET SKULL BASE TO THIGH TECHNIQUE: 6.7 mCi F-18 FDG was injected intravenously. Full-ring PET imaging was performed from the skull base to thigh after the radiotracer. CT data was obtained and used for attenuation correction and anatomic localization. Fasting blood glucose: 71 mg/dl COMPARISON:  09/26/2017 FINDINGS: Mediastinal blood pool activity: SUV max 1.64 Liver activity: SUV max 1.94 NECK: No hypermetabolic lymph nodes in the neck. Incidental CT findings: none CHEST: For left lateral supraclavicular lymph node measures 1.2 cm with SUV max of 7.8. New from previous exam. Slightly more medial there is a new left supraclavicular lymph node measuring 0.9 cm with SUV max of 4.87. No hypermetabolic axillary lymph nodes. No hypermetabolic mediastinal or hilar lymph nodes. Significant interval progression of bilateral hypermetabolic areas of nodularity and airspace consolidation. Right lower lobe area of airspace consolidation measures 8.5 cm with SUV max of 14.2. New from previous exam. Within the left lung there is a area of dense consolidation measuring 5.6 cm with SUV max of 9.6. New from previous exam. Right  middle lobe consolidation measures approximately 4.9 cm and has an SUV max of 9.6. Incidental CT findings: Heart size appears enlarged. No pericardial effusion identified. ABDOMEN/PELVIS: No abnormal radiotracer uptake within the liver, pancreas, and spleen. The adrenal glands are unremarkable. Multiple new hypermetabolic left retroperitoneal lymph nodes. Index lymph node conglomeration measures 3.4 cm with SUV max 10.96. Multiple new hypermetabolic pelvic lymph nodes identified. Nodal conglomeration just below the bifurcation on the left measures 3.3 cm with SUV max of 11.04. New left external iliac node measures 1.7 cm with SUV max of 12.93. New hypermetabolic right common iliac node measures 9 mm and has an SUV max of 5.47. Incidental CT findings: Small volume of ascites is identified. New from previous exam. SKELETON: Multifocal hypermetabolic bone lesions are identified, new from previous exam. Index lesion within the proximal left humerus has an SUV max of 3.36. Index lesion within the proximal left femur has an SUV max of 4.03. Right iliac bone lesion has an SUV max of 4.06. Within the left iliac wing there is a lesion with SUV max of 5.42. Incidental CT findings: none IMPRESSION: 1. Imaging findings compatible with recurrence of disease. 2. Multiple new large areas of hypermetabolic nodularity and airspace consolidation within both lungs which is presumed to represent pulmonary involvement by lymphoma. Deauville criteria 5. 3. New hypermetabolic left supraclavicular, left retroperitoneal, and bilateral pelvic lymph nodes. Deauville criteria 5. 4. Multifocal hypermetabolic osseous lesions.  Deauville criteria 5. 5. Small volume of ascites, new. Electronically Signed   By: Kerby Moors M.D.   On: 09/26/2018 14:56    ASSESSMENT & PLAN:  32 y.o. 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 She was noted to have significant constitutional symptoms including significant weight loss, fevers chills and some night sweats. HIV negative Hepatitis C and hepatitis B serologies negative. Echo with normal ejection fraction. Patient was treated with 5 cycles of AVD (Bleomycin held due to poor DLCO 37% and ongoing smoking). Multiple avoidable treatment delays due to the patient's noncompliance with follow-up for avoidable reasons. She has been counseled repeatedly that this would  increase the likelihood of unfavorable outcome.  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 S/p 6 cycles of 2nd line treatment with Bendamustine/Brentuximab And 1 cycle of Bretuximab alone  -PET/CT scan results from 04/14/2017 were discussed in details. She appears to have some persistent disease in her right lung at Deauville 5. It is difficult to say if this is recurrent or persistent disease since the patient failed to follow-up on multiple scheduled PET/CT scans in the early part and prior to her second line treatment.  09/26/18 PET/CT revealed Imaging findings compatible with recurrence of disease. 2. Multiple new large areas of hypermetabolic nodularity and airspace consolidation within both lungs which is presumed to represent pulmonary involvement by lymphoma. Deauville criteria 5. 3. New hypermetabolic left supraclavicular, left retroperitoneal, and bilateral pelvic lymph nodes. Deauville criteria 5. 4. Multifocal hypermetabolic osseous lesions. Deauville criteria 5. 5. Small volume of ascites, new.   #2 s/p hypoxic respiratory failurewith dense right lung consolidation and left upper lobe consolidation with SVC syndrome. Patient has completed palliative radiation to the right lung mass causing SVC compression and has had her port removed. Breathing has improved and patient is now off oxygen and ambulating  comfortably  #3 previous h/o SVC syndrome- right facial and right upper extremity swelling -resolved.  #4 Non compliancewith clinic and treatment followup. Missed 2nd dose of bendamustine with C2. Has missed multiple appointment for her PET/CT and missed appointment at [facility] for consideration of Transplant.  Pt was lost to follow up after July 2018 and returned on 08/31/18 Discussed the patient's goals of care and the pt noted that she is ready to begin treatment again and maintain compliance and follow ups    #5 h/o Grade 1 neuropathyfrom Brentuximab-Vedotin - resolved  #6 Shingles outbreak - curently - crusted over  PLAN:  -Discussed pt labwork today, 10/11/18; blood counts and chemistries are stable  -Continued to stress the importance of staying well hydrated to flush the chemotherapy out of her system -The pt has no prohibitive toxicities from continuing C1 ICE at this time.   -Continue to monitor for confusion, new headaches, or altered mental status  -Benadryl prn for itching -Continue eating well, staying hydrated, and ambulating periodically -Advised that pt let staff know if she develops any new concerns of cognitive differences while on ICE  -again reinforced the recommendation that the pt ensure that she stays well hydrated and empties her bladder on a regular basis. -Discussed that if the pt develops any confusion, vision changes, imbalance or other cognitive/nuerological changes she is to let us know immediately as this would be concerning for CNS toxicity from Ifosfamide which the pt understands and agrees to doing -[place] for ifosfamide toxicity mitigation.  Will need to continue good IV hydration day 3 as well. -Will continue Valtrex for shingles outbreak which has crusted over. -outpatient neulasta shot as per orders.on 10/13/2018 -continue [place] O2 2L/min and will need to continue on discharge. -patient has outpatient appointment at [facility] on  10/23/2018   #7 DVT Prophylaxis - lovenox    The total time spent in the appt was 25 minutes and more than 50% was on counseling and direct patient cares.      Sullivan Lone MD Vivian AAHIVMS [facility] Hematology/Oncology Physician [facility]  (Office):       [phone] (Work cell):  [phone] (Fax):           [phone]  10/11/2018 12:46 PM   I,  Baldwin [place], am acting as a scribe for Dr. Irene Limbo  .I have reviewed the above documentation for accuracy and completeness, and I agree with the above. Sullivan Lone MD MS

## 2018-10-11 NOTE — Telephone Encounter (Signed)
Appt scheduled patient notified per 10/16 sch msg °

## 2018-10-11 NOTE — Progress Notes (Signed)
Chemo dosing and calculations verified with Aloha Gell, RN.

## 2018-10-12 ENCOUNTER — Other Ambulatory Visit: Payer: Self-pay | Admitting: Hematology

## 2018-10-12 LAB — BASIC METABOLIC PANEL
ANION GAP: 7 (ref 5–15)
BUN: 9 mg/dL (ref 6–20)
CALCIUM: 9 mg/dL (ref 8.9–10.3)
CO2: 29 mmol/L (ref 22–32)
Chloride: 109 mmol/L (ref 98–111)
Creatinine, Ser: 0.67 mg/dL (ref 0.44–1.00)
GFR calc non Af Amer: >60 mL/min (ref >60)
Glucose, Bld: 116 mg/dL — ABNORMAL HIGH (ref 70–99)
Potassium: 4 mmol/L (ref 3.5–5.1)
SODIUM: 145 mmol/L (ref 135–145)

## 2018-10-12 LAB — CBC
HCT: 35.1 % — ABNORMAL LOW (ref 36.0–46.0)
Hemoglobin: 10.7 g/dL — ABNORMAL LOW (ref 12.0–15.0)
MCH: 29.9 pg (ref 26.0–34.0)
MCHC: 30.5 g/dL (ref 30.0–36.0)
MCV: 98 fL (ref 80.0–100.0)
NRBC: 0 % (ref 0.0–0.2)
PLATELETS: 513 10*3/uL — AB (ref 150–400)
RBC: 3.58 MIL/uL — AB (ref 3.87–5.11)
RDW: 14.3 % (ref 11.5–15.5)
WBC: 13 10*3/uL — AB (ref 4.0–10.5)

## 2018-10-12 MED ORDER — HEPARIN SOD (PORK) LOCK FLUSH 100 UNIT/ML IV SOLN
500.0000 [IU] | Freq: Once | INTRAVENOUS | Status: AC
  Administered 2018-10-12: 500 [IU] via INTRAVENOUS
  Filled 2018-10-12: qty 5

## 2018-10-12 MED ORDER — OXYCODONE-ACETAMINOPHEN 5-325 MG PO TABS: 1.0000 | ORAL_TABLET | Freq: Three times a day (TID) | ORAL | 0 refills | Status: DC | PRN

## 2018-10-12 MED ORDER — VALACYCLOVIR HCL 1 G PO TABS: 1000.0000 mg | ORAL_TABLET | Freq: Two times a day (BID) | ORAL | 0 refills | Status: DC

## 2018-10-12 NOTE — Progress Notes (Signed)
PT verbalized understanding of d/c instructions and scripts and appts. She is ambulatory and did not want a wheel chair. She HAS A FRIEND PICKING HER UP TO TAKE HER home

## 2018-10-12 NOTE — Discharge Summary (Signed)
[facility]  Telephone:([phone]) [phone] [phone]) [phone]    Physician Discharge Summary     Patient ID: Heidi [name] MRN: [id number] DOB/AGE: 27-Nov-1986 32 y.o.  Admit date: 10/09/2018 Discharge date: 10/12/2018  Primary Care Physician:  Clinic, General Medical   Discharge Diagnoses:    Present on Admission: . Hodgkin's disease in adult [facility])   Discharge Medications:  Allergies as of 10/12/2018   No Known Allergies     Medication List    STOP taking these medications   levalbuterol 45 MCG/ACT inhaler Commonly known as:  XOPENEX HFA     TAKE these medications   allopurinol 300 MG tablet Commonly known as:  ZYLOPRIM Take 1 tablet (300 mg total) by mouth daily.   gabapentin 100 MG capsule Commonly known as:  NEURONTIN Take 2 capsules (200 mg total) by mouth 2 (two) times daily.   lidocaine-prilocaine cream Commonly known as:  EMLA Apply 1 application topically as needed (numbing).   LORazepam 0.5 MG tablet Commonly known as:  ATIVAN Take 1 tablet (0.5 mg total) by mouth every 6 (six) hours as needed (Nausea or vomiting).   ondansetron 8 MG tablet Commonly known as:  ZOFRAN Take 1 tablet (8 mg total) by mouth 2 (two) times daily as needed (Nausea or vomiting).   oxyCODONE-acetaminophen 5-325 MG tablet Commonly known as:  PERCOCET/ROXICET Take 1-2 tablets by mouth every 8 (eight) hours as needed for severe pain.   prochlorperazine 10 MG tablet Commonly known as:  COMPAZINE Take 1 tablet (10 mg total) by mouth every 6 (six) hours as needed (Nausea or vomiting).   valACYclovir 1000 MG tablet Commonly known as:  VALTREX Take 1 tablet (1,000 mg total) by mouth 2 (two) times daily.        Disposition and Follow-up:   Significant Diagnostic Studies:  Nm Pet Image Restag (ps) Skull Base To Thigh  Result Date: 09/26/2018 CLINICAL DATA:  Subsequent treatment strategy for Hodgkin's lymphoma. EXAM: NUCLEAR MEDICINE PET  SKULL BASE TO THIGH TECHNIQUE: 6.7 mCi F-18 FDG was injected intravenously. Full-ring PET imaging was performed from the skull base to thigh after the radiotracer. CT data was obtained and used for attenuation correction and anatomic localization. Fasting blood glucose: 71 mg/dl COMPARISON:  09/26/2017 FINDINGS: Mediastinal blood pool activity: SUV max 1.64 Liver activity: SUV max 1.94 NECK: No hypermetabolic lymph nodes in the neck. Incidental CT findings: none CHEST: For left lateral supraclavicular lymph node measures 1.2 cm with SUV max of 7.8. New from previous exam. Slightly more medial there is a new left supraclavicular lymph node measuring 0.9 cm with SUV max of 4.87. No hypermetabolic axillary lymph nodes. No hypermetabolic mediastinal or hilar lymph nodes. Significant interval progression of bilateral hypermetabolic areas of nodularity and airspace consolidation. Right lower lobe area of airspace consolidation measures 8.5 cm with SUV max of 14.2. New from previous exam. Within the left lung there is a area of dense consolidation measuring 5.6 cm with SUV max of 9.6. New from previous exam. Right middle lobe consolidation measures approximately 4.9 cm and has an SUV max of 9.6. Incidental CT findings: Heart size appears enlarged. No pericardial effusion identified. ABDOMEN/PELVIS: No abnormal radiotracer uptake within the liver, pancreas, and spleen. The adrenal glands are unremarkable. Multiple new hypermetabolic left retroperitoneal lymph nodes. Index lymph node conglomeration measures 3.4 cm with SUV max 10.96. Multiple new hypermetabolic pelvic lymph nodes identified. Nodal conglomeration just below the bifurcation on the left measures 3.3 cm with SUV  max of 11.04. New left external iliac node measures 1.7 cm with SUV max of 12.93. New hypermetabolic right common iliac node measures 9 mm and has an SUV max of 5.47. Incidental CT findings: Small volume of ascites is identified. New from previous exam.  SKELETON: Multifocal hypermetabolic bone lesions are identified, new from previous exam. Index lesion within the proximal left humerus has an SUV max of 3.36. Index lesion within the proximal left femur has an SUV max of 4.03. Right iliac bone lesion has an SUV max of 4.06. Within the left iliac wing there is a lesion with SUV max of 5.42. Incidental CT findings: none IMPRESSION: 1. Imaging findings compatible with recurrence of disease. 2. Multiple new large areas of hypermetabolic nodularity and airspace consolidation within both lungs which is presumed to represent pulmonary involvement by lymphoma. Deauville criteria 5. 3. New hypermetabolic left supraclavicular, left retroperitoneal, and bilateral pelvic lymph nodes. Deauville criteria 5. 4. Multifocal hypermetabolic osseous lesions.  Deauville criteria 5. 5. Small volume of ascites, new. Electronically Signed   By: Kerby Moors M.D.   On: 09/26/2018 14:56    Discharge Laboratory Values: . CBC Latest Ref Rng & Units 10/12/2018 10/11/2018 10/10/2018  WBC 4.0 - 10.5 K/uL 13.0(H) 12.2(H) 7.6  Hemoglobin 12.0 - 15.0 g/dL 10.7(L) 10.7(L) 11.7(L)  Hematocrit 36.0 - 46.0 % 35.1(L) 34.2(L) 36.7  Platelets 150 - 400 K/uL 513(H) 501(H) 552(H)   . CMP Latest Ref Rng & Units 10/12/2018 10/11/2018 10/10/2018  Glucose 70 - 99 mg/dL 116(H) 120(H) 129(H)  BUN 6 - 20 mg/dL 9 7 7   Creatinine 0.44 - 1.00 mg/dL 0.67 0.50 0.62  Sodium 135 - 145 mmol/L 145 141 139  Potassium 3.5 - 5.1 mmol/L 4.0 4.2 4.4  Chloride 98 - 111 mmol/L 109 106 100  CO2 22 - 32 mmol/L 29 28 28   Calcium 8.9 - 10.3 mg/dL 9.0 8.5(L) 8.9  Total Protein 6.5 - 8.1 g/dL - - -  Total Bilirubin 0.3 - 1.2 mg/dL - - -  Alkaline Phos 38 - 126 U/L - - -  AST 15 - 41 U/L - - -  ALT 0 - 44 U/L - - -    Brief H and P: For complete details please refer to admission H and P, but in brief,   Heidi [name] is a  32 y.o. female who has been admitted today to begin C1 ICE to treat her  refractory mixed cellularity Hodgkin's lymphoma. The patient's last visit with [place] was on 09/28/18. The pt reports that she is doing well overall.   The pt reports that she has not developed any new concerns in the interim, and denies any constitutional symptoms. She notes that her breathing has been stable and her shingles has continued to resolve.   Issues during hospitalization  #1 Refractory/Progressive Mixed cellularity Hodgkin's lymphoma IV B Ewith extensive lymphadenopathy including right axillary, mediastinal and upper retroperitoneal and biopsy proven pulmonary involvement. She was noted to have significant constitutional symptoms including significant weight loss, fevers chills and some night sweats. HIV negative Hepatitis C and hepatitis B serologies negative. Echo with normal ejection fraction. Patient was treated with 5 cycles of AVD (Bleomycin held due to poor DLCO 37% and ongoing smoking). Multiple avoidable treatment delays due to the patient's noncompliance with follow-up for avoidable reasons. She has been counseled repeatedly that this would increase the likelihood of unfavorable outcome.  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 S/p 6 cycles of  2nd line treatment with Bendamustine/Brentuximab And 1 cycle of Bretuximab alone  -PET/CT scan results from 04/14/2017 were discussed in details. She appears to have some persistent disease in her right lung at Deauville 5. It is difficult to say if this is recurrent or persistent disease since the patient failed to follow-up on multiple scheduled PET/CT scans in the early part and prior to her second line treatment.  09/26/18 PET/CT revealed Imaging findings compatible with recurrence of disease. 2. Multiple new large areas of hypermetabolic nodularity and airspace consolidation within both lungs which is presumed to represent pulmonary involvement by lymphoma. Deauville criteria 5. 3. New  hypermetabolic left supraclavicular, left retroperitoneal, and bilateral pelvic lymph nodes. Deauville criteria 5. 4. Multifocal hypermetabolic osseous lesions. Deauville criteria 5. 5. Small volume of ascites, new.   #2 Pulmonary involvement with CHL - on Home oxygen at [place] 2L/min  #3 previous h/o SVC syndrome- right facial and right upper extremity swelling -resolved.  #4 Shingles outbreak - currently - crusted over. No pain.  PLAN:  - pt had no prohibitive toxicities from C1 ICE at this time.   -labs remained stable. -received adequate IVF and also received mesna to avoid bladder issues from high dose ifosfamide. -Will continue Valtrex for shingles outbreak which has crusted over. -outpatient neulasta shot as per orders.on 10/13/2018 -continue [place] O2 2L/min and will need to continue on discharge. -patient has outpatient appointment at [facility] on 10/23/2018 for Auto HSCT consideration.   #7 DVT Prophylaxis - lovenox   Physical Exam at Discharge: BP 130/83 (BP Location: Left Arm)   Pulse 70   Temp 97.7 F (36.5 C) (Oral)   Resp 20   LMP  (LMP Unknown)   SpO2 92%  . GENERAL:alert, in no acute distress and comfortable SKIN: no acute rashes, no significant lesions EYES: conjunctiva are pink and non-injected, sclera anicteric OROPHARYNX: MMM, no exudates, no oropharyngeal erythema or ulceration NECK: supple, no JVD LYMPH:  no palpable lymphadenopathy in the cervical, axillary or inguinal regions LUNGS: b/l unchanged scattered rhonci HEART: regular rate & rhythm ABDOMEN:  normoactive bowel sounds , non tender, not distended. Extremity: no pedal edema PSYCH: alert & oriented x 3 with fluent speech NEURO: no focal motor/sensory deficits     Hospital Course:  Active Problems:   Encounter for antineoplastic chemotherapy   Hodgkin's disease in adult [facility])   Herpes zoster without complication   Diet:  Regular Diet  Activity:  Infection precautions and crowd  avoidance  Condition at Discharge:   Stable  Signed: Dr. Sullivan Lone MD [place] [phone]  TT for discharing patient >38mins 10/12/2018, 10:30 AM

## 2018-10-13 ENCOUNTER — Inpatient Hospital Stay: Payer: Medicaid Other

## 2018-10-13 DIAGNOSIS — Z9119 Patient's noncompliance with other medical treatment and regimen: Secondary | ICD-10-CM | POA: Diagnosis not present

## 2018-10-13 DIAGNOSIS — Z79899 Other long term (current) drug therapy: Secondary | ICD-10-CM | POA: Diagnosis not present

## 2018-10-13 DIAGNOSIS — I313 Pericardial effusion (noninflammatory): Secondary | ICD-10-CM | POA: Diagnosis not present

## 2018-10-13 DIAGNOSIS — D696 Thrombocytopenia, unspecified: Secondary | ICD-10-CM | POA: Diagnosis not present

## 2018-10-13 DIAGNOSIS — G620 Drug-induced polyneuropathy: Secondary | ICD-10-CM | POA: Diagnosis not present

## 2018-10-13 DIAGNOSIS — Z923 Personal history of irradiation: Secondary | ICD-10-CM | POA: Diagnosis not present

## 2018-10-13 DIAGNOSIS — Z5189 Encounter for other specified aftercare: Secondary | ICD-10-CM | POA: Diagnosis present

## 2018-10-13 DIAGNOSIS — R188 Other ascites: Secondary | ICD-10-CM | POA: Diagnosis not present

## 2018-10-13 DIAGNOSIS — C8128 Mixed cellularity classical Hodgkin lymphoma, lymph nodes of multiple sites: Secondary | ICD-10-CM

## 2018-10-13 DIAGNOSIS — B029 Zoster without complications: Secondary | ICD-10-CM | POA: Diagnosis not present

## 2018-10-13 DIAGNOSIS — Z9114 Patient's other noncompliance with medication regimen: Secondary | ICD-10-CM | POA: Diagnosis not present

## 2018-10-13 DIAGNOSIS — D6481 Anemia due to antineoplastic chemotherapy: Secondary | ICD-10-CM | POA: Diagnosis not present

## 2018-10-13 DIAGNOSIS — Z7189 Other specified counseling: Secondary | ICD-10-CM

## 2018-10-13 DIAGNOSIS — Z7901 Long term (current) use of anticoagulants: Secondary | ICD-10-CM | POA: Diagnosis not present

## 2018-10-13 DIAGNOSIS — Z87891 Personal history of nicotine dependence: Secondary | ICD-10-CM | POA: Diagnosis not present

## 2018-10-13 MED ORDER — PEGFILGRASTIM INJECTION 6 MG/0.6ML ~~LOC~~
PREFILLED_SYRINGE | SUBCUTANEOUS | Status: AC
  Filled 2018-10-13: qty 0.6

## 2018-10-13 MED ORDER — PEGFILGRASTIM INJECTION 6 MG/0.6ML ~~LOC~~
6.0000 mg | PREFILLED_SYRINGE | Freq: Once | SUBCUTANEOUS | Status: AC
  Administered 2018-10-13: 6 mg via SUBCUTANEOUS

## 2018-10-13 NOTE — Patient Instructions (Signed)
Pegfilgrastim injection What is this medicine? PEGFILGRASTIM (PEG fil gra stim) is a long-acting granulocyte colony-stimulating factor that stimulates the growth of neutrophils, a type of white blood cell important in the body's fight against infection. It is used to reduce the incidence of fever and infection in patients with certain types of cancer who are receiving chemotherapy that affects the bone marrow, and to increase survival after being exposed to high doses of radiation. This medicine may be used for other purposes; ask your health care provider or pharmacist if you have questions. COMMON BRAND NAME(S): Neulasta What should I tell my health care provider before I take this medicine? They need to know if you have any of these conditions: -kidney disease -latex allergy -ongoing radiation therapy -sickle cell disease -skin reactions to acrylic adhesives (On-Body Injector only) -an unusual or allergic reaction to pegfilgrastim, filgrastim, other medicines, foods, dyes, or preservatives -pregnant or trying to get pregnant -breast-feeding How should I use this medicine? This medicine is for injection under the skin. If you get this medicine at home, you will be taught how to prepare and give the pre-filled syringe or how to use the On-body Injector. Refer to the patient Instructions for Use for detailed instructions. Use exactly as directed. Tell your healthcare provider immediately if you suspect that the On-body Injector may not have performed as intended or if you suspect the use of the On-body Injector resulted in a missed or partial dose. It is important that you put your used needles and syringes in a special sharps container. Do not put them in a trash can. If you do not have a sharps container, call your pharmacist or healthcare provider to get one. Talk to your pediatrician regarding the use of this medicine in children. While this drug may be prescribed for selected conditions,  precautions do apply. Overdosage: If you think you have taken too much of this medicine contact a poison control center or emergency room at once. NOTE: This medicine is only for you. Do not share this medicine with others. What if I miss a dose? It is important not to miss your dose. Call your doctor or health care professional if you miss your dose. If you miss a dose due to an On-body Injector failure or leakage, a new dose should be administered as soon as possible using a single prefilled syringe for manual use. What may interact with this medicine? Interactions have not been studied. Give your health care provider a list of all the medicines, herbs, non-prescription drugs, or dietary supplements you use. Also tell them if you smoke, drink alcohol, or use illegal drugs. Some items may interact with your medicine. This list may not describe all possible interactions. Give your health care provider a list of all the medicines, herbs, non-prescription drugs, or dietary supplements you use. Also tell them if you smoke, drink alcohol, or use illegal drugs. Some items may interact with your medicine. What should I watch for while using this medicine? You may need blood work done while you are taking this medicine. If you are going to need a MRI, CT scan, or other procedure, tell your doctor that you are using this medicine (On-Body Injector only). What side effects may I notice from receiving this medicine? Side effects that you should report to your doctor or health care professional as soon as possible: -allergic reactions like skin rash, itching or hives, swelling of the face, lips, or tongue -dizziness -fever -pain, redness, or irritation at site   where injected -pinpoint red spots on the skin -red or dark-brown urine -shortness of breath or breathing problems -stomach or side pain, or pain at the shoulder -swelling -tiredness -trouble passing urine or change in the amount of urine Side  effects that usually do not require medical attention (report to your doctor or health care professional if they continue or are bothersome): -bone pain -muscle pain This list may not describe all possible side effects. Call your doctor for medical advice about side effects. You may report side effects to FDA at 1-800-FDA-1088. Where should I keep my medicine? Keep out of the reach of children. Store pre-filled syringes in a refrigerator between 2 and 8 degrees C (36 and 46 degrees F). Do not freeze. Keep in carton to protect from light. Throw away this medicine if it is left out of the refrigerator for more than 48 hours. Throw away any unused medicine after the expiration date. NOTE: This sheet is a summary. It may not cover all possible information. If you have questions about this medicine, talk to your doctor, pharmacist, or health care provider.  2018 Elsevier/Gold Standard (2016-12-09 12:58:03)  

## 2018-10-18 ENCOUNTER — Emergency Department (HOSPITAL_COMMUNITY)
Admission: EM | Admit: 2018-10-18 | Discharge: 2018-10-18 | Disposition: A | Discharge status: Home / Self Care | Payer: Medicaid Other | Attending: Emergency Medicine | Admitting: Emergency Medicine

## 2018-10-18 ENCOUNTER — Encounter (HOSPITAL_COMMUNITY): Payer: Self-pay | Admitting: Emergency Medicine

## 2018-10-18 ENCOUNTER — Other Ambulatory Visit: Payer: Self-pay

## 2018-10-18 DIAGNOSIS — I10 Essential (primary) hypertension: Secondary | ICD-10-CM | POA: Insufficient documentation

## 2018-10-18 DIAGNOSIS — Z87891 Personal history of nicotine dependence: Secondary | ICD-10-CM | POA: Diagnosis not present

## 2018-10-18 DIAGNOSIS — Z79899 Other long term (current) drug therapy: Secondary | ICD-10-CM | POA: Insufficient documentation

## 2018-10-18 DIAGNOSIS — J45909 Unspecified asthma, uncomplicated: Secondary | ICD-10-CM | POA: Diagnosis not present

## 2018-10-18 DIAGNOSIS — B029 Zoster without complications: Secondary | ICD-10-CM | POA: Insufficient documentation

## 2018-10-18 MED ORDER — MORPHINE SULFATE (PF) 4 MG/ML IV SOLN
4.0000 mg | Freq: Once | INTRAVENOUS | Status: AC
  Administered 2018-10-18: 4 mg via INTRAMUSCULAR
  Filled 2018-10-18: qty 1

## 2018-10-18 MED ORDER — MORPHINE SULFATE (PF) 4 MG/ML IV SOLN: 4.0000 mg | Freq: Once | INTRAVENOUS | Status: DC

## 2018-10-18 MED ORDER — VALACYCLOVIR HCL 1 G PO TABS: 1000.0000 mg | ORAL_TABLET | Freq: Three times a day (TID) | ORAL | 0 refills | Status: DC

## 2018-10-18 MED ORDER — GABAPENTIN 100 MG PO CAPS: 200.0000 mg | ORAL_CAPSULE | Freq: Two times a day (BID) | ORAL | 0 refills | Status: DC

## 2018-10-18 NOTE — ED Notes (Signed)
Per MD, patient refused further workup although elevated temp. Went into room to discharge patient and patient was not present in room.

## 2018-10-18 NOTE — Discharge Instructions (Addendum)
Ms. Hewett,  Please increase Valtrex to 1000mg  TID and also start gabapentin that was prescribed by Dr. Irene Limbo.   ~Take Care  Dr. Eileen Stanford

## 2018-10-18 NOTE — ED Notes (Signed)
MD at bedside. 

## 2018-10-18 NOTE — ED Notes (Signed)
Patient with elevated temp 100.1 at discharge. MD made aware.

## 2018-10-18 NOTE — ED Triage Notes (Signed)
Pt complaint of severe left side pain related to recent diagnosis of shingles; pt last chemotherapy treatment a week ago.

## 2018-10-18 NOTE — ED Provider Notes (Addendum)
[facility] DEPT Provider Note   CSN: [id number] Arrival date & time: 10/18/18  2440     History   Chief Complaint Chief Complaint  Patient presents with  . Herpes Zoster  . Cancer    HPI Heidi [name] is a 32 y.o. female with refractory/progressive mixed cellularity Hodgkin's lymphoma stage IV with pulmonary involvement currently undergoing chemotherapy and a 3-week history of shingles at the T4 dermatome left-sided presented with worsening burning sensation.  Reports that she has been on Valtrex 1000 twice daily for the past 3 weeks however in the last couple days burning sensation has increased.  She denies fevers, chills, drainage, nausea or vomiting.   Past Medical History:  Diagnosis Date  . ARDS (adult respiratory distress syndrome) ([place])   . Asthma   . Hodgkin lymphoma ([place])   . Hypertension     Patient Active Problem List   Diagnosis Date Noted  . Herpes zoster without complication   . Counseling regarding advance care planning and goals of care 10/09/2018  . Hodgkin's disease in adult [facility]) 10/09/2018  . Port-A-Cath in place 09/28/2018  . Pruritus 01/20/2017  . Central line complication   . Deep vein thrombosis (DVT) of upper extremity ([place])   . SVC syndrome   . Dyspnea 12/14/2016  . Swelling 12/14/2016  . Anemia of chronic disease 11/26/2016  . CAP (community acquired pneumonia) 11/23/2016  . PNA (pneumonia) 11/23/2016  . Hyperpigmentation 06/04/2016  . Hypersensitivity reaction 05/04/2016  . Encounter for antineoplastic chemotherapy   . Hypokalemia 04/20/2016  . Volume overload 04/20/2016  . S/P bronchoscopy with biopsy   . 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 ([place])   . HCAP (healthcare-associated pneumonia) 04/10/2016  . Hyponatremia 04/10/2016  . Thrombocytosis ([place]) 04/10/2016  . Pneumonitis 03/16/2016  . Postobstructive pneumonia 03/16/2016  . Acute respiratory failure with  hypoxia ([place]) 03/16/2016  . Leukocytosis 03/16/2016  . Sepsis due to pneumonia ([place]) 03/16/2016  . Lung mass   . Lymphadenopathy     Past Surgical History:  Procedure Laterality Date  . AXILLARY LYMPH NODE BIOPSY Right 03/19/2016   Procedure: AXILLARY LYMPH NODE BIOPSY;  Surgeon: Armandina Gemma, MD;  Location: [facility] ORS;  Service: General;  Laterality: Right;  . IR GENERIC HISTORICAL  12/22/2016   IR FLUORO GUIDE PORT INSERTION LEFT 12/22/2016 [facility]INTER[facility]  . IR GENERIC HISTORICAL  12/22/2016   IR US GUIDE VASC ACCESS LEFT 12/22/2016 [facility]  . IR GENERIC HISTORICAL  12/22/2016   IR CV LINE INJECTION 12/22/2016 [facility]  . IR GENERIC HISTORICAL  12/22/2016   IR REMOVAL TUN ACCESS W/ PORT W/O FL MOD SED 12/22/2016 [facility]  . VIDEO BRONCHOSCOPY Bilateral 11/26/2016   Procedure: VIDEO BRONCHOSCOPY WITH FLUORO;  Surgeon: Rigoberto Noel, MD;  Location: [facility] ENDOSCOPY;  Service: Cardiopulmonary;  Laterality: Bilateral;     OB History   None      Home Medications    Prior to Admission medications   Medication Sig Start Date End Date Taking? Authorizing Provider  allopurinol (ZYLOPRIM) 300 MG tablet Take 1 tablet (300 mg total) by mouth daily. 10/11/18   Brunetta Genera, MD  gabapentin (NEURONTIN) 100 MG capsule Take 2 capsules (200 mg total) by mouth 2 (two) times daily. 10/18/18   Jean Rosenthal, MD  lidocaine-prilocaine (EMLA) cream Apply 1 application topically as needed (numbing). Patient not taking: Reported on 10/09/2018 09/28/18   Brunetta Genera, MD  LORazepam (ATIVAN) 0.5 MG  tablet Take 1 tablet (0.5 mg total) by mouth every 6 (six) hours as needed (Nausea or vomiting). 10/11/18   Brunetta Genera, MD  ondansetron (ZOFRAN) 8 MG tablet Take 1 tablet (8 mg total) by mouth 2 (two) times daily as needed (Nausea or vomiting). 10/11/18   Brunetta Genera, MD  oxyCODONE-acetaminophen (PERCOCET/ROXICET) 5-325 MG tablet Take 1-2 tablets by mouth every 8 (eight)  hours as needed for severe pain. 10/12/18   Brunetta Genera, MD  prochlorperazine (COMPAZINE) 10 MG tablet Take 1 tablet (10 mg total) by mouth every 6 (six) hours as needed (Nausea or vomiting). 10/11/18   Brunetta Genera, MD  valACYclovir (VALTREX) 1000 MG tablet Take 1 tablet (1,000 mg total) by mouth 3 (three) times daily. 10/18/18   Jean Rosenthal, MD    Family History No family history on file.  Social History Social History   Tobacco Use  . Smoking status: Former Smoker    Packs/day: 0.50    Years: 15.00    Pack years: 7.50    Types: Cigarettes    Last attempt to quit: 03/27/2016    Years since quitting: 2.5  . Smokeless tobacco: Never Used  Substance Use Topics  . Alcohol use: Yes    Comment: occasional now  . Drug use: Not on file     Allergies   Patient has no known allergies.   Review of Systems Review of Systems  Constitutional: Negative.   Eyes: Negative.   Respiratory: Negative.   Cardiovascular: Negative.   Gastrointestinal: Negative.   Musculoskeletal: Negative.   Skin: Positive for rash (Left sided hypopigmented lesions at T4 anteriorly and posteriorly, with vesicular rash).  Neurological:       Neuropathic pain   Psychiatric/Behavioral: Negative.      Physical Exam Updated Vital Signs BP (!) 137/92 (BP Location: Right Arm)   Temp 98.7 F (37.1 C) (Oral)   LMP  (LMP Unknown)   Physical Exam  Constitutional: She appears well-developed and well-nourished.  HENT:  Head: Normocephalic and atraumatic.  Cardiovascular: Normal rate and regular rhythm.  Skin: Rash noted.       ED Treatments / Results  Labs (all labs ordered are listed, but only abnormal results are displayed) Labs Reviewed - No data to display  EKG None  Radiology No results found.  Procedures Procedures (including critical care time)  Medications Ordered in ED Medications  morphine 4 MG/ML injection 4 mg (4 mg Intramuscular Given 10/18/18 0858)      Initial Impression / Assessment and Plan / ED Course  I have reviewed the triage vital signs and the nursing notes.  Pertinent labs & imaging results that were available during my care of the patient were reviewed by me and considered in my medical decision making (see chart for details).   32 year old woman with refractory/progressive mixed cellularity Hodgkin's lymphoma stage IV with pulmonary involvement currently undergoing chemotherapy and a 3-week history of shingles resenting with 2-day history of worsening left-sided neuropathic pain.  Previously prescribed Valtrex 1000 mg twice daily and gabapentin however unsure if patient is taking gabapentin.  Acute pain treated with IM morphine and asked patient to increase frequency of Valtrex to 1000 mg 3 times daily and advise on adherence with gabapentin.  Prior to discharge, patient was noted to have a temperature of 100.1 however after discussing with patient on starting work-up for neutropenic fever, she refused to stay.  I thoroughly counseled patient to immediately return to the ED if she  starts having fevers, chills, cough, chest pain, nausea, vomiting, headaches, lightheadedness.   Final Clinical Impressions(s) / ED Diagnoses   Final diagnoses:  Herpes zoster without complication    ED Discharge Orders         Ordered    gabapentin (NEURONTIN) 100 MG capsule  2 times daily     10/18/18 0856    valACYclovir (VALTREX) 1000 MG tablet  3 times daily     10/18/18 0856           Jean Rosenthal, MD 10/18/18 1016    Jean Rosenthal, MD 10/18/18 1047    Quintella Reichert, MD 10/21/18 [phone]

## 2018-10-20 ENCOUNTER — Emergency Department (HOSPITAL_COMMUNITY): Payer: Medicaid Other

## 2018-10-20 ENCOUNTER — Telehealth: Payer: Self-pay

## 2018-10-20 ENCOUNTER — Other Ambulatory Visit: Payer: Self-pay

## 2018-10-20 ENCOUNTER — Other Ambulatory Visit: Payer: Self-pay | Admitting: Hematology

## 2018-10-20 ENCOUNTER — Encounter (HOSPITAL_COMMUNITY): Payer: Self-pay | Admitting: Emergency Medicine

## 2018-10-20 ENCOUNTER — Emergency Department (HOSPITAL_COMMUNITY)
Admission: EM | Admit: 2018-10-20 | Discharge: 2018-10-20 | Disposition: A | Discharge status: Home / Self Care | Payer: Medicaid Other | Attending: Emergency Medicine | Admitting: Emergency Medicine

## 2018-10-20 DIAGNOSIS — Z87891 Personal history of nicotine dependence: Secondary | ICD-10-CM | POA: Insufficient documentation

## 2018-10-20 DIAGNOSIS — E876 Hypokalemia: Secondary | ICD-10-CM | POA: Diagnosis not present

## 2018-10-20 DIAGNOSIS — C8190 Hodgkin lymphoma, unspecified, unspecified site: Secondary | ICD-10-CM | POA: Diagnosis not present

## 2018-10-20 DIAGNOSIS — Z79899 Other long term (current) drug therapy: Secondary | ICD-10-CM | POA: Diagnosis not present

## 2018-10-20 DIAGNOSIS — I10 Essential (primary) hypertension: Secondary | ICD-10-CM | POA: Diagnosis not present

## 2018-10-20 DIAGNOSIS — B0229 Other postherpetic nervous system involvement: Secondary | ICD-10-CM

## 2018-10-20 DIAGNOSIS — D61810 Antineoplastic chemotherapy induced pancytopenia: Secondary | ICD-10-CM

## 2018-10-20 DIAGNOSIS — R21 Rash and other nonspecific skin eruption: Secondary | ICD-10-CM | POA: Diagnosis present

## 2018-10-20 DIAGNOSIS — C8128 Mixed cellularity classical Hodgkin lymphoma, lymph nodes of multiple sites: Secondary | ICD-10-CM | POA: Diagnosis not present

## 2018-10-20 LAB — CBC WITH DIFFERENTIAL/PLATELET
ABS IMMATURE GRANULOCYTES: 0.08 10*3/uL — AB (ref 0.00–0.07)
BASOS ABS: 0 10*3/uL (ref 0.0–0.1)
BASOS PCT: 1 %
EOS PCT: 0 %
Eosinophils Absolute: 0 10*3/uL (ref 0.0–0.5)
HCT: 26.1 % — ABNORMAL LOW (ref 36.0–46.0)
Hemoglobin: 8.4 g/dL — ABNORMAL LOW (ref 12.0–15.0)
IMMATURE GRANULOCYTES: 4 %
LYMPHS PCT: 1 %
Lymphs Abs: 0 10*3/uL — ABNORMAL LOW (ref 0.7–4.0)
MCH: 29.8 pg (ref 26.0–34.0)
MCHC: 32.2 g/dL (ref 30.0–36.0)
MCV: 92.6 fL (ref 80.0–100.0)
Monocytes Absolute: 0.1 10*3/uL (ref 0.1–1.0)
Monocytes Relative: 5 %
Neutro Abs: 1.9 10*3/uL (ref 1.7–7.7)
Neutrophils Relative %: 89 %
Platelets: 33 10*3/uL — ABNORMAL LOW (ref 150–400)
RBC: 2.82 MIL/uL — AB (ref 3.87–5.11)
RDW: 13.8 % (ref 11.5–15.5)
WBC: 2.2 10*3/uL — AB (ref 4.0–10.5)
nRBC: 0 % (ref 0.0–0.2)

## 2018-10-20 LAB — BASIC METABOLIC PANEL
ANION GAP: 15 (ref 5–15)
BUN: 12 mg/dL (ref 6–20)
CO2: 28 mmol/L (ref 22–32)
Calcium: 8.8 mg/dL — ABNORMAL LOW (ref 8.9–10.3)
Chloride: 95 mmol/L — ABNORMAL LOW (ref 98–111)
Creatinine, Ser: 0.69 mg/dL (ref 0.44–1.00)
Glucose, Bld: 106 mg/dL — ABNORMAL HIGH (ref 70–99)
POTASSIUM: 2.9 mmol/L — AB (ref 3.5–5.1)
SODIUM: 138 mmol/L (ref 135–145)

## 2018-10-20 LAB — POC URINE PREG, ED: Preg Test, Ur: NEGATIVE

## 2018-10-20 LAB — I-STAT CG4 LACTIC ACID, ED: LACTIC ACID, VENOUS: 1.6 mmol/L (ref 0.5–1.9)

## 2018-10-20 MED ORDER — MORPHINE SULFATE (PF) 4 MG/ML IV SOLN
4.0000 mg | Freq: Once | INTRAVENOUS | Status: AC
  Administered 2018-10-20: 4 mg via INTRAVENOUS
  Filled 2018-10-20: qty 1

## 2018-10-20 MED ORDER — ALUM & MAG HYDROXIDE-SIMETH 200-200-20 MG/5ML PO SUSP
30.0000 mL | Freq: Once | ORAL | Status: AC
  Administered 2018-10-20: 30 mL via ORAL
  Filled 2018-10-20: qty 30

## 2018-10-20 MED ORDER — SODIUM CHLORIDE 0.9 % IV BOLUS
1000.0000 mL | Freq: Once | INTRAVENOUS | Status: AC
  Administered 2018-10-20: 1000 mL via INTRAVENOUS

## 2018-10-20 MED ORDER — POTASSIUM CHLORIDE 10 MEQ/100ML IV SOLN
10.0000 meq | Freq: Once | INTRAVENOUS | Status: AC
  Administered 2018-10-20: 10 meq via INTRAVENOUS
  Filled 2018-10-20: qty 100

## 2018-10-20 MED ORDER — POTASSIUM CHLORIDE CRYS ER 20 MEQ PO TBCR
40.0000 meq | EXTENDED_RELEASE_TABLET | Freq: Once | ORAL | Status: AC
  Administered 2018-10-20: 40 meq via ORAL
  Filled 2018-10-20: qty 2

## 2018-10-20 MED ORDER — HEPARIN SOD (PORK) LOCK FLUSH 100 UNIT/ML IV SOLN
500.0000 [IU] | Freq: Once | INTRAVENOUS | Status: AC
  Administered 2018-10-20: 500 [IU]
  Filled 2018-10-20: qty 5

## 2018-10-20 MED ORDER — GABAPENTIN 300 MG PO CAPS: 300.0000 mg | ORAL_CAPSULE | Freq: Two times a day (BID) | ORAL | 0 refills | Status: DC

## 2018-10-20 MED ORDER — POTASSIUM CHLORIDE CRYS ER 20 MEQ PO TBCR: 20.0000 meq | EXTENDED_RELEASE_TABLET | Freq: Every day | ORAL | 0 refills | Status: DC

## 2018-10-20 MED ORDER — ONDANSETRON HCL 4 MG/2ML IJ SOLN
4.0000 mg | Freq: Once | INTRAMUSCULAR | Status: AC
  Administered 2018-10-20: 4 mg via INTRAVENOUS
  Filled 2018-10-20: qty 2

## 2018-10-20 MED ORDER — OXYCODONE-ACETAMINOPHEN 5-325 MG PO TABS: 1.0000 | ORAL_TABLET | Freq: Three times a day (TID) | ORAL | 0 refills | Status: DC | PRN

## 2018-10-20 NOTE — Telephone Encounter (Signed)
Visit from [facility], Triage, noting that pt was recently seen in the ED for shingles. Pain is still severe and pt calling to see if anything can be prescribed for pain. Percocet refilled by Dr. Irene Limbo and neurontin refilled with increased dose. Pt called and notified. Additionally, appt scheduled for next Tuesday (10/24/18) for labs at 2:30pm and MD visit at 3pm.

## 2018-10-20 NOTE — Discharge Instructions (Addendum)
Please have your blood recheck on Monday and follow up closely with your cancer doctor for further care.  Return if you develop any abnormal bleeding.  Take percocet as needed for your pain.  Your potassium level is low, take supplementation prescribed and have it recheck next week.

## 2018-10-20 NOTE — ED Notes (Signed)
Patient transported to X-ray 

## 2018-10-20 NOTE — ED Triage Notes (Signed)
Pt was seen here 2 days ago for shingles under right breast. Started on valtrex and states that still burning and not helping.

## 2018-10-20 NOTE — ED Provider Notes (Signed)
[facility] DEPT Provider Note   CSN: [id number] Arrival date & time: 10/20/18  1759     History   Chief Complaint Chief Complaint  Patient presents with  . Herpes Zoster    HPI Heidi [name] is a 32 y.o. female.  The history is provided by the patient and medical records. No language interpreter was used.     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  Pt has had a month long history of shingle at the left T4 dermatome, currently on Valtrex 1,000mg  TID, along with gabapentin.  She mentioned she was also prescribe Percocet from her oncologist but have not had the opportunity to get her medication filled appropriately.  She continues to endorse persistent burning pain at the site of the rash moderate to severe.  She also report having pain each time she eats or drink.  She has appetite but afraid to eat.  Her primary concern is pain control.  She is mention otherwise been compliant with her other medications.  No report of fever, chills, shortness of breath, productive cough or dysuria.  She is recently getting treated with for lymphoma with chemo  Past Medical History:  Diagnosis Date  . ARDS (adult respiratory distress syndrome) ([place])   . Asthma   . Hodgkin lymphoma ([place])   . Hypertension     Patient Active Problem List   Diagnosis Date Noted  . Herpes zoster without complication   . Counseling regarding advance care planning and goals of care 10/09/2018  . Hodgkin's disease in adult [facility]) 10/09/2018  . Port-A-Cath in place 09/28/2018  . Pruritus 01/20/2017  . Central line complication   . Deep vein thrombosis (DVT) of upper extremity ([place])   . SVC syndrome   . Dyspnea 12/14/2016  . Swelling 12/14/2016  . Anemia of chronic disease 11/26/2016  . CAP (community acquired pneumonia) 11/23/2016  . PNA (pneumonia) 11/23/2016  .  Hyperpigmentation 06/04/2016  . Hypersensitivity reaction 05/04/2016  . Encounter for antineoplastic chemotherapy   . Hypokalemia 04/20/2016  . Volume overload 04/20/2016  . S/P bronchoscopy with biopsy   . 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 HCAP (healthcare-associated pneumonia) 04/10/2016  . Hyponatremia 04/10/2016  . Thrombocytosis ([place]) 04/10/2016  . Pneumonitis 03/16/2016  . Postobstructive pneumonia 03/16/2016  . Acute respiratory failure with hypoxia ([place]) 03/16/2016  . Leukocytosis 03/16/2016  . Sepsis due to pneumonia ([place]) 03/16/2016  . Lung mass   . Lymphadenopathy     Past Surgical History:  Procedure Laterality Date  . AXILLARY LYMPH NODE BIOPSY Right 03/19/2016   Procedure: AXILLARY LYMPH NODE BIOPSY;  Surgeon: Armandina Gemma, MD;  Location: [facility] ORS;  Service: General;  Laterality: Right;  . IR GENERIC HISTORICAL  12/22/2016   IR FLUORO GUIDE PORT INSERTION LEFT 12/22/2016 [facility]  . IR GENERIC HISTORICAL  12/22/2016   IR US GUIDE VASC ACCESS LEFT 12/22/2016 [facility]  . IR GENERIC HISTORICAL  12/22/2016   IR CV LINE INJECTION 12/22/2016 [facility]  . IR GENERIC HISTORICAL  12/22/2016   IR REMOVAL TUN ACCESS W/ PORT W/O FL MOD SED 12/22/2016 [facility]  . VIDEO BRONCHOSCOPY Bilateral 11/26/2016   Procedure: VIDEO BRONCHOSCOPY WITH FLUORO;  Surgeon: Rigoberto Noel, MD;  Location: [facility] ENDOSCOPY;  Service: Cardiopulmonary;  Laterality: Bilateral;     OB History   None  Home Medications    Prior to Admission medications   Medication Sig Start Date End Date Taking? Authorizing Provider  allopurinol (ZYLOPRIM) 300 MG tablet Take 1 tablet (300 mg total) by mouth daily. 10/11/18   Brunetta Genera, MD  gabapentin (NEURONTIN) 300 MG capsule Take 1 capsule (300 mg total) by mouth 2 (two) times daily. 10/20/18   Brunetta Genera, MD  lidocaine-prilocaine (EMLA) cream Apply 1 application topically as  needed (numbing). Patient not taking: Reported on 10/09/2018 09/28/18   Brunetta Genera, MD  LORazepam (ATIVAN) 0.5 MG tablet Take 1 tablet (0.5 mg total) by mouth every 6 (six) hours as needed (Nausea or vomiting). 10/11/18   Brunetta Genera, MD  ondansetron (ZOFRAN) 8 MG tablet Take 1 tablet (8 mg total) by mouth 2 (two) times daily as needed (Nausea or vomiting). 10/11/18   Brunetta Genera, MD  oxyCODONE-acetaminophen (PERCOCET/ROXICET) 5-325 MG tablet Take 1-2 tablets by mouth every 8 (eight) hours as needed for severe pain. 10/20/18   Brunetta Genera, MD  prochlorperazine (COMPAZINE) 10 MG tablet Take 1 tablet (10 mg total) by mouth every 6 (six) hours as needed (Nausea or vomiting). 10/11/18   Brunetta Genera, MD  valACYclovir (VALTREX) 1000 MG tablet Take 1 tablet (1,000 mg total) by mouth 3 (three) times daily. 10/18/18   Jean Rosenthal, MD    Family History No family history on file.  Social History Social History   Tobacco Use  . Smoking status: Former Smoker    Packs/day: 0.50    Years: 15.00    Pack years: 7.50    Types: Cigarettes    Last attempt to quit: 03/27/2016    Years since quitting: 2.5  . Smokeless tobacco: Never Used  Substance Use Topics  . Alcohol use: Yes    Comment: occasional now  . Drug use: Not on file     Allergies   Patient has no known allergies.   Review of Systems Review of Systems  All other systems reviewed and are negative.    Physical Exam Updated Vital Signs BP 125/73   Pulse 97   Temp 98.3 F (36.8 C) (Oral)   Resp (!) 21   Ht 5' 5.5" (1.664 m)   Wt 60.3 kg   LMP  (LMP Unknown)   SpO2 99%   BMI 21.80 kg/m   Physical Exam  Constitutional: She appears well-developed and well-nourished. No distress.  HENT:  Head: Atraumatic.  Mouth/Throat: Oropharynx is clear and moist.  Eyes: Conjunctivae are normal.  Neck: Neck supple.  Cardiovascular: Normal rate and regular rhythm.  Pulmonary/Chest: Effort  normal and breath sounds normal. She exhibits tenderness (Old skin rash along T4 dermatomal pattern on the left side tender to palpation extending to the back.).  Abdominal: Soft. Bowel sounds are normal. She exhibits no distension. There is no tenderness.  Neurological: She is alert.  Skin: No rash noted.  Psychiatric: She has a normal mood and affect.  Nursing note and vitals reviewed.    ED Treatments / Results  Labs (all labs ordered are listed, but only abnormal results are displayed) Labs Reviewed  CBC WITH DIFFERENTIAL/PLATELET - Abnormal; Notable for the following components:      Result Value   WBC 2.2 (*)    RBC 2.82 (*)    Hemoglobin 8.4 (*)    HCT 26.1 (*)    Platelets 33 (*)    Lymphs Abs 0.0 (*)    Abs Immature Granulocytes 0.08 (*)  All other components within normal limits  BASIC METABOLIC PANEL - Abnormal; Notable for the following components:   Potassium 2.9 (*)    Chloride 95 (*)    Glucose, Bld 106 (*)    Calcium 8.8 (*)    All other components within normal limits  I-STAT CG4 LACTIC ACID, ED  POC URINE PREG, ED    EKG None   Date: 10/20/2018  Rate: 103  Rhythm: normal sinus tach  QRS Axis: normal  Intervals: normal  ST/T Wave abnormalities: normal  Conduction Disutrbances: none  Narrative Interpretation:   Old EKG Reviewed: No significant changes noted     Radiology Dg Chest 2 View  Result Date: 10/20/2018 CLINICAL DATA:  Chest pain EXAM: CHEST - 2 VIEW COMPARISON:  PET CT 09/26/2018 FINDINGS: There is an accessed left chest wall Port-A-Cath the follows a left internal jugular vein approach to terminate in the lower SVC. Cardiomediastinal contours are normal. There is large area of consolidation in the right mid lung with mild airspace opacity in the left mid lung. No pleural effusion or pneumothorax. IMPRESSION: Moderate right and mild left mid lung airspace opacities, which may be secondary to the patient's lymphoma, though superimposed  infectious pneumonia could also have this appearance. Electronically Signed   By: Ulyses Jarred M.D.   On: 10/20/2018 20:00    Procedures .Critical Care Performed by: Domenic Moras, PA-C Authorized by: Domenic Moras, PA-C   Critical care provider statement:    Critical care time (minutes):  45   Critical care was time spent personally by me on the following activities:  Discussions with consultants, evaluation of patient's response to treatment, examination of patient, ordering and performing treatments and interventions, ordering and review of laboratory studies, ordering and review of radiographic studies, pulse oximetry, re-evaluation of patient's condition, obtaining history from patient or surrogate and review of old charts   (including critical care time)  Medications Ordered in ED Medications  morphine 4 MG/ML injection 4 mg (4 mg Intravenous Given 10/20/18 1908)  ondansetron (ZOFRAN) injection 4 mg (4 mg Intravenous Given 10/20/18 1909)  sodium chloride 0.9 % bolus 1,000 mL (0 mLs Intravenous Stopped 10/20/18 1939)  alum & mag hydroxide-simeth (MAALOX/MYLANTA) 200-200-20 MG/5ML suspension 30 mL (30 mLs Oral Given 10/20/18 1958)  potassium chloride SA (K-DUR,KLOR-CON) CR tablet 40 mEq (40 mEq Oral Given 10/20/18 2127)  potassium chloride 10 mEq in 100 mL IVPB (10 mEq Intravenous New Bag/Given 10/20/18 2130)  morphine 4 MG/ML injection 4 mg (4 mg Intravenous Given 10/20/18 2124)     Initial Impression / Assessment and Plan / ED Course  I have reviewed the triage vital signs and the nursing notes.  Pertinent labs & imaging results that were available during my care of the patient were reviewed by me and considered in my medical decision making (see chart for details).     BP 125/73   Pulse 97   Temp 98.3 F (36.8 C) (Oral)   Resp (!) 21   Ht 5' 5.5" (1.664 m)   Wt 60.3 kg   LMP  (LMP Unknown)   SpO2 99%   BMI 21.80 kg/m    Final Clinical Impressions(s) / ED Diagnoses    Final diagnoses:  Pancytopenia due to chemotherapy [facility])  Post herpetic neuralgia  Hypokalemia    ED Discharge Orders         Ordered    potassium chloride SA (K-DUR,KLOR-CON) 20 MEQ tablet  Daily     10/20/18 2231  6:57 PM Patient with residual shingle related rash involving the left side of her chest and back in the setting of lack of pain medication.  Will provide pain management.  She also complaining of pain whenever she eats.  She has a fairly benign abdominal exam but does have tenderness along her rash.  We will give GI cocktail and check labs.  IV fluid given.  8:06 PM Chest x-ray shows moderate right and left midlung airspace opacity which may be secondary to patient's lymphoma although superimposed infectious pneumonia could also have this appearance.  Patient however denies any shortness of breath productive cough or fever therefore low suspicion for pneumonia.  Her blood work is concerning for pancytopenia with WBC 2.2, hemoglobin 8.4, platelets 33.  This could be secondary to current chemotherapy. Will consult hematology.  She also has low potassium of 2.9, will give supplementation. EKG ordered.    9:05 PM From prior oncology note on 10/17, patient is currently being treated with C1 ICE to treat her refractory mixed cellularity Hodgkin lymphoma.  10:10 PM I have consulted oncall hematologist Dr. Audelia Hives who recommend pt to follow up for blood work on Monday to reassess her pancytopenia.  Pt currently without stigmata, no source of bleeding, furthermore no pneumonia sxs.  She has normal absolute neutrophil count.  Pt will be given a prescription for percocet for pain control     Domenic Moras, PA-C 10/20/18 2232    Quintella Reichert, MD 10/21/18 [phone]

## 2018-10-23 ENCOUNTER — Other Ambulatory Visit: Payer: Self-pay | Admitting: *Deleted

## 2018-10-23 DIAGNOSIS — C8128 Mixed cellularity classical Hodgkin lymphoma, lymph nodes of multiple sites: Secondary | ICD-10-CM

## 2018-10-23 NOTE — Progress Notes (Signed)
[name] Kitchen  HEMATOLOGY ONCOLOGY PROGRESS NOTE  Date of service:   10/24/18     Patient Care Team: Clinic, General Medical as PCP - General (Family Medicine)  Chief complaint: Follow-up for Hodgkin's lymphoma  Diagnosis:   Refractory Mixed cellularity Hodgkin's lymphoma IVBE with extensive lymphadenopathy including right axillary, mediastinal and upper retroperitoneal and now biopsy proven pulmonary involvement. She was noted to have significant constitutional symptoms including significant weight loss, fevers chills and some night sweats.  Current Treatment: none. She was lost to f/u for >81yr  Previous treatment 5 cycles of AVD (without bleomycin due to lung involvement and DLCO of 37%, active smoker) Multiple avoidable treatment delays due to the patient's noncompliance with follow-up for avoidable reasons. She has been counseled repeatedly that this would increase the likelihood of unfavorable outcome.  2nd line therapy with Bendamustine + Brentuximab s/p 7 cycles.  INTERVAL HISTORY:  Heidi [name] here for follow-up for her Hodgkins lymphoma. I last saw the pt at discharge from C1 ICE on 10/12/18. The pt reports that she is doing well overall.   The pt reports that the burning sensation of her shingles rash became acutely worse in the interim, and she presented to the ED on 10/18/18 and 10/20/18. She notes that her shingles rash has been dry, and she has continued on the increased dose of Gabapentin, as well as Valtrex and Percocet.   The pt notes that her breathing is much improved and has not had to use her oxygen unit, even while ambulating. The pt notes that she [facility] also [facility], and reports that she is awaiting a repeated lung function test for further evaluation.   The pt denies fatigue, light headedness or dizziness. She also denies any problems with bleeding or any concerns of infections.    Lab results  today (10/24/18) of CBC w/diff, CMP is as follows: all values are WNL except for RBC at 2.74, HGB at 8.2, HCT at 25.3, PLT at 16k, Lymphs abs at 200, Abs immature granulocytes at 0.35k, Total Protein at 6.1, Albumin at 3.1, AST at 12, Alk Phos at 151. 10/24/18 Magnesium is pending 10/24/18 Phosphorous is pending   On review of systems, pt reports eating well, improved breathing, fairly controlled shingles pain, and denies mouth sores, swallowing problems, other skin rashes, concerns for bleeding, light headedness, dizziness, abdominal pains, fatigue, concerns for infection, pain while urinating, and any other symptoms.   REVIEW OF SYSTEMS:    A 10+ POINT REVIEW OF SYSTEMS WAS OBTAINED including neurology, dermatology, psychiatry, cardiac, respiratory, lymph, extremities, GI, GU, Musculoskeletal, constitutional, breasts, reproductive, HEENT.  All pertinent positives are noted in the HPI.  All others are negative.  . Past Medical History:  Diagnosis Date  . ARDS (adult respiratory distress syndrome) ([place]   . Asthma   . Hodgkin [place]   . Hypertension     . Past Surgical History:  Procedure Laterality Date  . AXILLARY LYMPH NODE BIOPSY Right 03/19/2016   Procedure: AXILLARY LYMPH NODE BIOPSY;  Surgeon: TArmandina Gemma MD;  Location: [facility];  Service: General;  Laterality: Right;  . IR GENERIC HISTORICAL  12/22/2016   IR FLUORO GUIDE PORT INSERTION LEFT 12/22/2016 [facility]  . IR GENERIC HISTORICAL  12/22/2016   IR [place] V[place] ACCESS LEFT 12/22/2016 [facility]  . IR GENERIC HISTORICAL  12/22/2016   IR CV LINE INJECTION 12/22/2016 [facility]  . IR GENERIC HISTORICAL  12/22/2016   IR REMOVAL TUN ACCESS W/ PORT W/O FL MOD SED 12/22/2016 [facility]  . VIDEO BRONCHOSCOPY Bilateral 11/26/2016   Procedure: VIDEO BRONCHOSCOPY WITH FLUORO;  Surgeon: Rigoberto Noel, MD;  Location: [facility];  Service: Cardiopulmonary;  Laterality: Bilateral;    . Social History   Tobacco  Use  . Smoking status: Former Smoker    Packs/day: 0.50    Years: 15.00    Pack years: 7.50    Types: Cigarettes    Last attempt to quit: 03/27/2016    Years since quitting: 2.5  . Smokeless tobacco: Never Used  Substance Use Topics  . Alcohol use: Yes    Comment: occasional now  . Drug use: Not on file    ALLERGIES:  has No Known Allergies.  MEDICATIONS:  Current Outpatient Medications  Medication Sig Dispense Refill  . allopurinol (ZYLOPRIM) 300 MG tablet Take 1 tablet (300 mg total) by mouth daily. 30 tablet 2  . gabapentin (NEURONTIN) 300 MG capsule Take 1 capsule (300 mg total) by mouth 2 (two) times daily. 60 capsule 0  . lidocaine (XYLOCAINE) 5 % ointment Apply 1 application topically as needed. For severe pain from shingles 35.44 g 0  . lidocaine-prilocaine (EMLA) cream Apply 1 application topically as needed (numbing). (Patient not taking: Reported on 10/09/2018) 30 g 1  . LORazepam (ATIVAN) 0.5 MG tablet Take 1 tablet (0.5 mg total) by mouth every 6 (six) hours as needed (Nausea or vomiting). 30 tablet 0  . ondansetron (ZOFRAN) 8 MG tablet Take 1 tablet (8 mg total) by mouth 2 (two) times daily as needed (Nausea or vomiting). 30 tablet 1  . oxyCODONE-acetaminophen (PERCOCET/ROXICET) 5-325 MG tablet Take 1-2 tablets by mouth every 8 (eight) hours as needed for severe pain. 60 tablet 0  . potassium chloride SA (K-DUR,KLOR-CON) 20 MEQ tablet Take 1 tablet (20 mEq total) by mouth daily. 3 tablet 0  . prochlorperazine (COMPAZINE) 10 MG tablet Take 1 tablet (10 mg total) by mouth every 6 (six) hours as needed (Nausea or vomiting). 30 tablet 1  . valACYclovir (VALTREX) 1000 MG tablet Take 1 tablet (1,000 mg total) by mouth 3 (three) times daily. 20 tablet 0   No current facility-administered medications for this visit.     PHYSICAL EXAMINATION: ECOG PERFORMANCE STATUS: 1 - Symptomatic but completely ambulatory  Vitals:   10/24/18 1408  BP: 124/81  Pulse: 93  Resp: 17  Temp:  98.4 F (36.9 C)  SpO2: 93%    Filed Weights   10/24/18 1408  Weight: 127 lb 8 oz (57.8 kg)   .Body mass index is 20.89 kg/m.  GENERAL:alert, in no acute distress and comfortable SKIN: no acute rashes, no significant lesions EYES: conjunctiva are pink and non-injected, sclera anicteric OROPHARYNX: MMM, no exudates, no oropharyngeal erythema or ulceration NECK: supple, no JVD LYMPH:  no palpable lymphadenopathy in the cervical, axillary or inguinal regions LUNGS: scattered fine crackles and rhonchi bilaterally, more on the right lung base HEART: regular rate & rhythm ABDOMEN:  normoactive bowel sounds , non tender, not distended. No palpable hepatosplenomegaly.  Extremity: no pedal edema PSYCH: alert & oriented x 3 with fluent speech NEURO: no focal motor/sensory deficits   LABORATORY DATA:   I have reviewed the data as listed  . CBC Latest Ref Rng & Units 10/24/2018 10/20/2018 10/12/2018  WBC 4.0 - 10.5 K/uL 7.3 2.2(L) 13.0(H)  Hemoglobin 12.0 - 15.0 g/dL 8.2(L) 8.4(L) 10.7(L)  Hematocrit 36.0 - 46.0 % 25.3(L) 26.1(L) 35.1(L)  Platelets 150 - 400 K/uL 16(L) 33(L) 513(H)   . CBC    Component Value Date/Time   WBC 7.3 10/24/2018 1356   WBC 2.2 (L) 10/20/2018 1904   RBC 2.74 (L) 10/24/2018 1356   HGB 8.2 (L) 10/24/2018 1356   HGB 11.7 07/20/2017 1315   HCT 25.3 (L) 10/24/2018 1356   HCT 35.2 07/20/2017 1315   PLT 16 (L) 10/24/2018 1356   PLT 266 07/20/2017 1315   MCV 92.3 10/24/2018 1356   MCV 93.2 07/20/2017 1315   MCH 29.9 10/24/2018 1356   MCHC 32.4 10/24/2018 1356   RDW 14.0 10/24/2018 1356   RDW 14.5 07/20/2017 1315   LYMPHSABS 0.2 (L) 10/24/2018 1356   LYMPHSABS 0.3 (L) 07/20/2017 1315   MONOABS 0.5 10/24/2018 1356   MONOABS 0.5 07/20/2017 1315   EOSABS 0.0 10/24/2018 1356   EOSABS 0.2 07/20/2017 1315   BASOSABS 0.0 10/24/2018 1356   BASOSABS 0.0 07/20/2017 1315   . CMP Latest Ref Rng & Units 10/24/2018 10/20/2018 10/12/2018  Glucose 70 - 99 mg/dL  79 106(H) 116(H)  BUN 6 - 20 mg/dL _0 Creatinine 0.44 - 1.00 mg/dL 0.69 0.69 0.67  Sodium 135 - 145 mmol/L 143 138 145  Potassium 3.5 - 5.1 mmol/L 3.6 2.9(L) 4.0  Chloride 98 - 111 mmol/L 102 95(L) 109  CO2 22 - 32 mmol/L _1 Calcium 8.9 - 10.3 mg/dL 8.9 8.8(L) 9.0  Total Protein 6.5 - 8.1 g/dL 6.1(L) - -  Total Bilirubin 0.3 - 1.2 mg/dL 0.4 - -  Alkaline Phos 38 - 126 U/L 151(H) - -  AST 15 - 41 U/L 12(L) - -  ALT 0 - 44 U/L 9 - -     RADIOGRAPHIC STUDIES: I have personally reviewed the radiological images as listed and agreed with the findings in the report. Dg Chest 2 View  Result Date: 10/20/2018 CLINICAL DATA:  Chest pain EXAM: CHEST - 2 VIEW COMPARISON:  PET CT 09/26/2018 FINDINGS: There is an accessed left chest wall Port-A-Cath the follows a left internal jugular vein approach to terminate in the lower SVC. Cardiomediastinal contours are normal. There is large area of consolidation in the right mid lung with mild airspace opacity in the left mid lung. No pleural effusion or pneumothorax. IMPRESSION: Moderate right and mild left mid lung airspace opacities, which may be secondary to the patient's lymphoma, though superimposed infectious pneumonia could also have this appearance. Electronically Signed   By: Ulyses Jarred M.D.   On: 10/20/2018 20:00   Nm Pet Image Restag (ps) Skull Base To Thigh  Result Date: 09/26/2018 CLINICAL DATA:  Subsequent treatment strategy for Hodgkin's lymphoma. EXAM: NUCLEAR MEDICINE PET SKULL BASE TO THIGH TECHNIQUE: 6.7 mCi F-18 FDG was injected intravenously. Full-ring PET imaging was performed from the skull base to thigh after the radiotracer. CT data was obtained and used for attenuation correction and anatomic localization. Fasting blood glucose: 71 mg/dl COMPARISON:  09/26/2017 FINDINGS: Mediastinal blood pool activity: SUV max 1.64 Liver activity: SUV max 1.94 NECK: No hypermetabolic lymph nodes in the neck. Incidental CT findings: none  CHEST: For left lateral supraclavicular lymph node measures 1.2 cm with SUV max of 7.8. New from previous exam. Slightly more medial there is a new left supraclavicular lymph node measuring 0.9 cm with SUV max of 4.87. No hypermetabolic axillary lymph nodes. No hypermetabolic mediastinal or hilar lymph nodes. Significant interval progression of bilateral hypermetabolic areas of nodularity and airspace consolidation. Right lower lobe  area of airspace consolidation measures 8.5 cm with SUV max of 14.2. New from previous exam. Within the left lung there is a area of dense consolidation measuring 5.6 cm with SUV max of 9.6. New from previous exam. Right middle lobe consolidation measures approximately 4.9 cm and has an SUV max of 9.6. Incidental CT findings: Heart size appears enlarged. No pericardial effusion identified. ABDOMEN/PELVIS: No abnormal radiotracer uptake within the liver, pancreas, and spleen. The adrenal glands are unremarkable. Multiple new hypermetabolic left retroperitoneal lymph nodes. Index lymph node conglomeration measures 3.4 cm with SUV max 10.96. Multiple new hypermetabolic pelvic lymph nodes identified. Nodal conglomeration just below the bifurcation on the left measures 3.3 cm with SUV max of 11.04. New left external iliac node measures 1.7 cm with SUV max of 12.93. New hypermetabolic right common iliac node measures 9 mm and has an SUV max of 5.47. Incidental CT findings: Small volume of ascites is identified. New from previous exam. SKELETON: Multifocal hypermetabolic bone lesions are identified, new from previous exam. Index lesion within the proximal left humerus has an SUV max of 3.36. Index lesion within the proximal left femur has an SUV max of 4.03. Right iliac bone lesion has an SUV max of 4.06. Within the left iliac wing there is a lesion with SUV max of 5.42. Incidental CT findings: none IMPRESSION: 1. Imaging findings compatible with recurrence of disease. 2. Multiple new large  areas of hypermetabolic nodularity and airspace consolidation within both lungs which is presumed to represent pulmonary involvement by lymphoma. Deauville criteria 5. 3. New hypermetabolic left supraclavicular, left retroperitoneal, and bilateral pelvic lymph nodes. Deauville criteria 5. 4. Multifocal hypermetabolic osseous lesions.  Deauville criteria 5. 5. Small volume of ascites, new. Electronically Signed   By: Kerby Moors M.D.   On: 09/26/2018 14:56    ASSESSMENT & PLAN:   32 y.o. African-American female with  #1 Refractory/Progressive Mixed cellularity Hodgkin's lymphoma IV B Ewith extensive lymphadenopathy including right axillary, mediastinal and upper retroperitoneal and now biopsy proven pulmonary involvement. She was noted to have significant constitutional symptoms including significant weight loss, fevers chills and some night sweats. HIV negative Hepatitis C and hepatitis B serologies negative. Echo with normal ejection fraction. Patient was treated with 5 cycles of AVD (Bleomycin held due to poor DLCO 37% and ongoing smoking). Multiple avoidable treatment delays due to the patient's noncompliance with follow-up for avoidable reasons. She has been counseled repeatedly that this would increase the likelihood of unfavorable outcome.  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 S/p 6 cycles of 2nd line treatment with Bendamustine/Brentuximab And 1 cycle of Bretuximab alone  -PET/CT scan results from 04/14/2017 were discussed in details. She appears to have some persistent disease in her right lung at Deauville 5. It is difficult to say if this is recurrent or persistent disease since the patient failed to follow-up on multiple scheduled PET/CT scans in the early part and prior to her second line treatment.  09/26/18 PET/CT revealed Imaging findings compatible with recurrence of disease. 2. Multiple new large areas of hypermetabolic nodularity and  airspace consolidation within both lungs which is presumed to represent pulmonary involvement by lymphoma. Deauville criteria 5. 3. New hypermetabolic left supraclavicular, left retroperitoneal, and bilateral pelvic lymph nodes. Deauville criteria 5. 4. Multifocal hypermetabolic osseous lesions. Deauville criteria 5. 5. Small volume of ascites, new.   #2 s/p hypoxic respiratory failurewith dense right lung consolidation and left upper lobe consolidation with SVC syndrome. Patient has completed  palliative radiation to the right lung mass causing SVC compression and has had her port removed. 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  #4 Non compliancewith clinic and treatment followup. Missed 2nd dose of bendamustine with C2. Has missed multiple appointment for her PET/CT and missed appointment at [facility] for consideration of Transplant.  Pt was lost to follow up after July 2018 and returned on 08/31/18 Discussed the patient's goals of care and the pt noted that she is ready to begin treatment again and maintain compliance and follow ups    #5 h/o Grade 1 neuropathyfrom Brentuximab-Vedotin - resolved  #6 Shingles outbreak - curently - crusted over  PLAN:  - Pt had no prohibitive toxicities symptoms from C1 ICEand received adequate IVF and also received mesna to avoid bladder issues from high dose ifosfamide. -Will continue Valtrex for shingles outbreak which has crusted over. -patient had outpatient appointment at [facility] on 10/23/2018 for Auto HSCT consideration   -Discussed pt labwork today, 10/24/18; worsened anemia with HGB at 8.2, worsened thrombocytopenia with PLT at 16k, no neutropenia with ANC at 6.2k -Will order 2 units PRBCs -Will order 1 unit PLT -Will have the pt seen in one week with Dr Denyse Amass for her labs to be evaluated again    -Will refill Lidocaine cream -Considering C2 ICE as early as 10/30/18 provided that the pt's lab work and clinical presentations are not prohibitive  -Will repeat pulmonary function test after C2 of ICE  #7 DVT Prophylaxis - lovenox    PRBC transfusion 2 units and Platelets 1 unit tomorrow RTC with Dr Audelia Hives on 10/30/2018 with labs Will need to decide on C2 of inpatient ICE after evaluation by Dr Audelia Hives on 10/30/2018    The total time spent in the appt was 30 minutes and more than 50% was on counseling and direct patient cares.    Sullivan Lone MD Heidi AAHIVMS [facility] Hematology/Oncology Physician [facility]  (Office):       [phone] (Work cell):  [phone] (Fax):           [phone]  I, Baldwin Jamaica, am acting as a scribe for Dr. Irene Limbo  .I have reviewed the above documentation for accuracy and completeness, and I agree with the above. Brunetta Genera MD

## 2018-10-24 ENCOUNTER — Inpatient Hospital Stay (HOSPITAL_BASED_OUTPATIENT_CLINIC_OR_DEPARTMENT_OTHER): Payer: Medicaid Other | Admitting: Hematology

## 2018-10-24 ENCOUNTER — Other Ambulatory Visit: Payer: Self-pay | Admitting: Hematology

## 2018-10-24 ENCOUNTER — Other Ambulatory Visit: Payer: Self-pay | Admitting: *Deleted

## 2018-10-24 ENCOUNTER — Encounter: Payer: Self-pay | Admitting: Hematology

## 2018-10-24 ENCOUNTER — Inpatient Hospital Stay: Payer: Medicaid Other

## 2018-10-24 VITALS — BP 124/81 | HR 93 | Temp 98.4°F | Resp 17 | Ht 65.5 in | Wt 127.5 lb

## 2018-10-24 DIAGNOSIS — Z87891 Personal history of nicotine dependence: Secondary | ICD-10-CM

## 2018-10-24 DIAGNOSIS — D6481 Anemia due to antineoplastic chemotherapy: Secondary | ICD-10-CM

## 2018-10-24 DIAGNOSIS — D696 Thrombocytopenia, unspecified: Secondary | ICD-10-CM | POA: Diagnosis not present

## 2018-10-24 DIAGNOSIS — C8128 Mixed cellularity classical Hodgkin lymphoma, lymph nodes of multiple sites: Secondary | ICD-10-CM

## 2018-10-24 DIAGNOSIS — T451X5A Adverse effect of antineoplastic and immunosuppressive drugs, initial encounter: Secondary | ICD-10-CM

## 2018-10-24 DIAGNOSIS — B0229 Other postherpetic nervous system involvement: Secondary | ICD-10-CM

## 2018-10-24 DIAGNOSIS — Z79899 Other long term (current) drug therapy: Secondary | ICD-10-CM

## 2018-10-24 DIAGNOSIS — Z95828 Presence of other vascular implants and grafts: Secondary | ICD-10-CM

## 2018-10-24 DIAGNOSIS — Z7901 Long term (current) use of anticoagulants: Secondary | ICD-10-CM

## 2018-10-24 DIAGNOSIS — Z923 Personal history of irradiation: Secondary | ICD-10-CM

## 2018-10-24 LAB — CMP (CANCER CENTER ONLY)
ALT: 9 U/L (ref 0–44)
ANION GAP: 11 (ref 5–15)
AST: 12 U/L — ABNORMAL LOW (ref 15–41)
Albumin: 3.1 g/dL — ABNORMAL LOW (ref 3.5–5.0)
Alkaline Phosphatase: 151 U/L — ABNORMAL HIGH (ref 38–126)
BILIRUBIN TOTAL: 0.4 mg/dL (ref 0.3–1.2)
BUN: 8 mg/dL (ref 6–20)
CO2: 30 mmol/L (ref 22–32)
Calcium: 8.9 mg/dL (ref 8.9–10.3)
Chloride: 102 mmol/L (ref 98–111)
Creatinine: 0.69 mg/dL (ref 0.44–1.00)
GFR, Est AFR Am: >60 mL/min (ref >60)
Glucose, Bld: 79 mg/dL (ref 70–99)
POTASSIUM: 3.6 mmol/L (ref 3.5–5.1)
Sodium: 143 mmol/L (ref 135–145)
TOTAL PROTEIN: 6.1 g/dL — AB (ref 6.5–8.1)

## 2018-10-24 LAB — MAGNESIUM: Magnesium: 1.7 mg/dL (ref 1.7–2.4)

## 2018-10-24 LAB — CBC WITH DIFFERENTIAL (CANCER CENTER ONLY)
ABS IMMATURE GRANULOCYTES: 0.35 10*3/uL — AB (ref 0.00–0.07)
BASOS PCT: 0 %
Basophils Absolute: 0 10*3/uL (ref 0.0–0.1)
EOS ABS: 0 10*3/uL (ref 0.0–0.5)
Eosinophils Relative: 0 %
HCT: 25.3 % — ABNORMAL LOW (ref 36.0–46.0)
Hemoglobin: 8.2 g/dL — ABNORMAL LOW (ref 12.0–15.0)
Immature Granulocytes: 5 %
Lymphocytes Relative: 2 %
Lymphs Abs: 0.2 10*3/uL — ABNORMAL LOW (ref 0.7–4.0)
MCH: 29.9 pg (ref 26.0–34.0)
MCHC: 32.4 g/dL (ref 30.0–36.0)
MCV: 92.3 fL (ref 80.0–100.0)
MONO ABS: 0.5 10*3/uL (ref 0.1–1.0)
Monocytes Relative: 7 %
NEUTROS ABS: 6.2 10*3/uL (ref 1.7–7.7)
NEUTROS PCT: 86 %
PLATELETS: 16 10*3/uL — AB (ref 150–400)
RBC: 2.74 MIL/uL — AB (ref 3.87–5.11)
RDW: 14 % (ref 11.5–15.5)
WBC: 7.3 10*3/uL (ref 4.0–10.5)
nRBC: 0 % (ref 0.0–0.2)

## 2018-10-24 LAB — PHOSPHORUS: PHOSPHORUS: 4.1 mg/dL (ref 2.5–4.6)

## 2018-10-24 MED ORDER — LIDOCAINE 5 % EX OINT: 1.0000 "application " | TOPICAL_OINTMENT | CUTANEOUS | 0 refills | Status: DC | PRN

## 2018-10-24 MED ORDER — ACYCLOVIR 400 MG PO TABS: 400.0000 mg | ORAL_TABLET | Freq: Two times a day (BID) | ORAL | 6 refills | Status: DC

## 2018-10-24 NOTE — Patient Instructions (Signed)
Thank you for choosing [facility] to provide your oncology and hematology care.  To afford each patient quality time with our providers, please arrive 30 minutes before your scheduled appointment time.  If you arrive late for your appointment, you may be asked to reschedule.  We strive to give you quality time with our providers, and arriving late affects you and other patients whose appointments are after yours.    If you are a no show for multiple scheduled visits, you may be dismissed from the clinic at the providers discretion.     Again, thank you for choosing [facility], our hope is that these requests will decrease the amount of time that you wait before being seen by our physicians.  ______________________________________________________________________   Should you have questions after your visit to the [facility], please contact our office at [phone] between the hours of 8:30 and 4:30 p.m.    Voicemails left after 4:30p.m will not be returned until the following business day.     For prescription refill requests, please have your pharmacy contact us directly.  Please also try to allow 48 hours for prescription requests.     Please contact the scheduling department for questions regarding scheduling.  For scheduling of procedures such as PET scans, CT scans, MRI, Ultrasound, etc please contact central scheduling at [phone].     Resources For Cancer Patients and Caregivers:    Oncolink.org:  A wonderful resource for patients and healthcare providers for information regarding your disease, ways to tract your treatment, what to expect, etc.      American Cancer Society:  800-227-2345  Can help patients locate various types of support and financial assistance   Cancer Care: 1-800-813-HOPE (4673) Provides financial assistance, online support groups, medication/co-pay assistance.     [place] DSS:  336-641-3447 Where to apply  for food stamps, Medicaid, and utility assistance   Medicare Rights Center: 800-333-4114 Helps people with Medicare understand their rights and benefits, navigate the Medicare system, and secure the quality healthcare they deserve   [employer]: 336-333-6589 [place] [employer]'s shared-ride transportation service for eligible riders who have a disability that prevents them from riding the fixed route bus.     For additional information on assistance programs please contact our social worker:   Abigail Elmore:  [phone]  

## 2018-10-25 ENCOUNTER — Other Ambulatory Visit: Payer: Medicaid Other

## 2018-10-25 ENCOUNTER — Encounter (HOSPITAL_COMMUNITY): Payer: Medicaid Other

## 2018-10-26 ENCOUNTER — Telehealth: Payer: Self-pay | Admitting: Hematology and Oncology

## 2018-10-26 ENCOUNTER — Telehealth: Payer: Self-pay | Admitting: Hematology

## 2018-10-26 NOTE — Telephone Encounter (Signed)
Pt uses [employer] - expressed how they run late some times. Told patient about our program and have set her up. She is going to use [facility] for her next appointment. Gave her my contact information to call for future appointments.

## 2018-10-26 NOTE — Telephone Encounter (Signed)
R/s appt per 10/31 sch message - pt is aware of appt .

## 2018-10-27 ENCOUNTER — Other Ambulatory Visit: Payer: Medicaid Other

## 2018-10-27 ENCOUNTER — Telehealth: Payer: Self-pay | Admitting: Hematology

## 2018-10-27 NOTE — Telephone Encounter (Signed)
Per 11/1 sch Message - no appt scheduled yet - no availability in the treatment area . Patient has had issues with transportation and have been unable to come to last two appts for lab and transfusion .   Spoke with Transportation at [employer] Ginette Otto) - Patient declined transportation with [facility] has referred patient to Social work .   Appts will be made when patient transportation is figured out - Rn Katharine Look and [employer] (Dr. Irene Limbo ) aware of transportation issues.

## 2018-10-27 NOTE — Progress Notes (Signed)
Patient was contacted today by another nurse from this infusion room. She was scheduled to come for labs today and then blood tomorrow but she told Maudie Mercury RN that she could not make it in due to transportation issues today . I did tell her we needed labs to do any blood transfusions tomorrow and that the blood appt for 11/2 WOULD need to be rescheduled if she cannot make it in today. She verbalized understanding. I did tell her if she felt bad she should have her children take her to the ER and not wait until next week. I did tell her I would pass this information over to MD [facility] and his nurses and they would talk with her about rescheduling.

## 2018-10-29 ENCOUNTER — Encounter (HOSPITAL_COMMUNITY): Payer: Self-pay | Admitting: Emergency Medicine

## 2018-10-29 ENCOUNTER — Emergency Department (HOSPITAL_COMMUNITY)
Admission: EM | Admit: 2018-10-29 | Discharge: 2018-10-29 | Disposition: A | Discharge status: Home / Self Care | Payer: Medicaid Other | Attending: Emergency Medicine | Admitting: Emergency Medicine

## 2018-10-29 DIAGNOSIS — I10 Essential (primary) hypertension: Secondary | ICD-10-CM | POA: Insufficient documentation

## 2018-10-29 DIAGNOSIS — Z79899 Other long term (current) drug therapy: Secondary | ICD-10-CM | POA: Diagnosis not present

## 2018-10-29 DIAGNOSIS — Z8571 Personal history of Hodgkin lymphoma: Secondary | ICD-10-CM | POA: Diagnosis not present

## 2018-10-29 DIAGNOSIS — B0229 Other postherpetic nervous system involvement: Secondary | ICD-10-CM | POA: Diagnosis present

## 2018-10-29 DIAGNOSIS — Z87891 Personal history of nicotine dependence: Secondary | ICD-10-CM | POA: Insufficient documentation

## 2018-10-29 MED ORDER — OXYCODONE-ACETAMINOPHEN 5-325 MG PO TABS: 1.0000 | ORAL_TABLET | Freq: Four times a day (QID) | ORAL | 0 refills | Status: DC | PRN

## 2018-10-29 MED ORDER — OXYCODONE-ACETAMINOPHEN 5-325 MG PO TABS
2.0000 | ORAL_TABLET | Freq: Once | ORAL | Status: AC
  Administered 2018-10-29: 2 via ORAL
  Filled 2018-10-29: qty 2

## 2018-10-29 MED ORDER — AMITRIPTYLINE HCL 10 MG PO TABS: 10.0000 mg | ORAL_TABLET | Freq: Every day | ORAL | 0 refills | Status: DC

## 2018-10-29 NOTE — ED Provider Notes (Addendum)
[place] DEPT Provider Note   CSN: [id number] Arrival date & time: 10/29/18  0940     History   Chief Complaint Chief Complaint  Patient presents with  . Herpes Zoster    HPI Heidi [name] is a 32 y.o. female.  Patient with hx Hodgkin lymphoma, shingles, presents c/o persistent pain in area of her recent shingles. Patient indicates is out of her pain medication and pain is persistent, constant, mod-severe. No acute or abrupt worsening of pain, but persistence. States now w just scarring to area - lesions had previously scabbed over. Denies redness or swelling to area. No fever or chills. Otherwise does not feel sick or ill. Has not yet taken anything to day for pain - states her mother drove her to ED.   The history is provided by the patient.    Past Medical History:  Diagnosis Date  . ARDS (adult respiratory distress syndrome) ([place])   . Asthma   . Hodgkin lymphoma ([place])   . Hypertension     Patient Active Problem List   Diagnosis Date Noted  . Herpes zoster without complication   . Counseling regarding advance care planning and goals of care 10/09/2018  . Hodgkin's disease in adult [facility]) 10/09/2018  . Port-A-Cath in place 09/28/2018  . Pruritus 01/20/2017  . Central line complication   . Deep vein thrombosis (DVT) of upper extremity ([place])   . SVC syndrome   . Dyspnea 12/14/2016  . Swelling 12/14/2016  . Anemia of chronic disease 11/26/2016  . CAP (community acquired pneumonia) 11/23/2016  . PNA (pneumonia) 11/23/2016  . Hyperpigmentation 06/04/2016  . Hypersensitivity reaction 05/04/2016  . Encounter for antineoplastic chemotherapy   . Hypokalemia 04/20/2016  . Volume overload 04/20/2016  . S/P bronchoscopy with biopsy   . 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 HCAP (healthcare-associated pneumonia) 04/10/2016  . Hyponatremia 04/10/2016  . Thrombocytosis ([place]) 04/10/2016  .  Pneumonitis 03/16/2016  . Postobstructive pneumonia 03/16/2016  . Acute respiratory failure with hypoxia ([place]) 03/16/2016  . Leukocytosis 03/16/2016  . Sepsis due to pneumonia ([place]) 03/16/2016  . Lung mass   . Lymphadenopathy     Past Surgical History:  Procedure Laterality Date  . AXILLARY LYMPH NODE BIOPSY Right 03/19/2016   Procedure: AXILLARY LYMPH NODE BIOPSY;  Surgeon: Armandina Gemma, MD;  Location: [facility];  Service: General;  Laterality: Right;  . IR GENERIC HISTORICAL  12/22/2016   IR FLUORO GUIDE PORT INSERTION LEFT 12/22/2016 [facility]  . IR GENERIC HISTORICAL  12/22/2016   IR US GUIDE VASC ACCESS LEFT 12/22/2016 [facility]  . IR GENERIC HISTORICAL  12/22/2016   IR CV LINE INJECTION 12/22/2016 [facility]  . IR GENERIC HISTORICAL  12/22/2016   IR REMOVAL TUN ACCESS W/ PORT W/O FL MOD SED 12/22/2016 [facility]  . VIDEO BRONCHOSCOPY Bilateral 11/26/2016   Procedure: VIDEO BRONCHOSCOPY WITH FLUORO;  Surgeon: Rigoberto Noel, MD;  Location: [facility] ENDOS[facility];  Service: Cardiopulmonary;  Laterality: Bilateral;     OB History   None      Home Medications    Prior to Admission medications   Medication Sig Start Date End Date Taking? Authorizing Provider  acyclovir (ZOVIRAX) 400 MG tablet Take 1 tablet (400 mg total) by mouth 2 (two) times daily. Start after completion of Valtrex 10/24/18   Brunetta Genera, MD  gabapentin (NEURONTIN) 300 MG capsule Take 1 capsule (300 mg total) by mouth 2 (two) times  daily. 10/20/18   Brunetta Genera, MD  lidocaine (XYLOCAINE) 5 % ointment Apply 1 application topically as needed. For severe pain from shingles 10/24/18   Brunetta Genera, MD  lidocaine-prilocaine (EMLA) cream Apply 1 application topically as needed (numbing). Patient not taking: Reported on 10/09/2018 09/28/18   Brunetta Genera, MD  LORazepam (ATIVAN) 0.5 MG tablet Take 1 tablet (0.5 mg total) by mouth every 6 (six) hours as needed (Nausea or vomiting).  10/11/18   Brunetta Genera, MD  ondansetron (ZOFRAN) 8 MG tablet Take 1 tablet (8 mg total) by mouth 2 (two) times daily as needed (Nausea or vomiting). 10/11/18   Brunetta Genera, MD  oxyCODONE-acetaminophen (PERCOCET/ROXICET) 5-325 MG tablet Take 1-2 tablets by mouth every 8 (eight) hours as needed for severe pain. 10/20/18   Brunetta Genera, MD  potassium chloride SA (K-DUR,KLOR-CON) 20 MEQ tablet Take 1 tablet (20 mEq total) by mouth daily. 10/20/18   Domenic Moras, PA-C  prochlorperazine (COMPAZINE) 10 MG tablet Take 1 tablet (10 mg total) by mouth every 6 (six) hours as needed (Nausea or vomiting). 10/11/18   Brunetta Genera, MD  valACYclovir (VALTREX) 1000 MG tablet Take 1 tablet (1,000 mg total) by mouth 3 (three) times daily. 10/18/18   Jean Rosenthal, MD    Family History History reviewed. No pertinent family history.  Social History Social History   Tobacco Use  . Smoking status: Former Smoker    Packs/day: 0.50    Years: 15.00    Pack years: 7.50    Types: Cigarettes    Last attempt to quit: 03/27/2016    Years since quitting: 2.5  . Smokeless tobacco: Never Used  Substance Use Topics  . Alcohol use: Yes    Comment: occasional now  . Drug use: Not on file     Allergies   Patient has no known allergies.   Review of Systems Review of Systems  Constitutional: Negative for chills and fever.  Respiratory: Negative for shortness of breath.   Cardiovascular: Negative for chest pain.  Gastrointestinal: Negative for abdominal pain and vomiting.  Skin:       Recent herpes zoster.   Neurological: Negative for headaches.     Physical Exam Updated Vital Signs BP (!) 142/100 (BP Location: Left Arm)   Pulse (!) 110   Temp 97.9 F (36.6 C) (Oral)   Resp 16   LMP  (LMP Unknown)   SpO2 95%   Physical Exam  Constitutional: She appears well-developed and well-nourished.  Eyes: Conjunctivae are normal. No scleral icterus.  Neck: Neck supple. No  tracheal deviation present.  Cardiovascular: Normal rate, regular rhythm, normal heart sounds and intact distal pulses. Exam reveals no gallop and no friction rub.  No murmur heard. Pulmonary/Chest: Effort normal and breath sounds normal. No respiratory distress.  Abdominal: Normal appearance. She exhibits no distension. There is no tenderness.  Musculoskeletal: She exhibits no edema.  Neurological: She is alert.  Skin: Skin is warm and dry.  Scarring from recent herpes zoster to left thorax, single dermatoma. No acute infection or cellulitis.   Psychiatric: She has a normal mood and affect.  Nursing note and vitals reviewed.    ED Treatments / Results  Labs (all labs ordered are listed, but only abnormal results are displayed) Labs Reviewed - No data to display  EKG None  Radiology No results found.  Procedures Procedures (including critical care time)  Medications Ordered in ED Medications  oxyCODONE-acetaminophen (PERCOCET/ROXICET) 5-325 MG per  tablet 2 tablet (has no administration in time range)     Initial Impression / Assessment and Plan / ED Course  I have reviewed the triage vital signs and the nursing notes.  Pertinent labs & imaging results that were available during my care of the patient were reviewed by me and considered in my medical decision making (see chart for details).  Reviewed nursing notes and prior charts for additional history.   On my exam, chest cta. Hr is 88. rr 14. Afebrile.   Pt c/o the same pain related to/localized to area of her shingles.   Pt reports she feels no benefit from the gabapentin.  Will try low dose elavil and topical lidocaine for postherpetic neuralgia. Will also give rx small quantity opiate pain med. rec primary care/heme-onf f/u.   Pt indicates has not yet taken anything today for pain. Also states has ride/does not have to drive/mom drove her - percocet po.  rx for home provided.     Final Clinical Impressions(s)  / ED Diagnoses   Final diagnoses:  None    ED Discharge Orders    None       Lajean Saver, MD 10/29/18 1033    Lajean Saver, MD 10/29/18 1034

## 2018-10-29 NOTE — ED Triage Notes (Signed)
Pt seen several times over past several weeks for shingles. Pt arrived tearful c/o continuing pain r/t shingles under L breast and radiating to torso.

## 2018-10-29 NOTE — Discharge Instructions (Addendum)
It was our pleasure to provide your ER care today - we hope that you feel better.  Take elavil as prescribed. Use lidocaine cream to area as need for symptom relief.  Take motrin or aleve as need for pain. You may also take percocet as need for pain. No driving for the next 6 hours or when taking percocet. Also, do not take tylenol or acetaminophen containing medication when taking percocet.   Follow up with primary care doctor in the coming week for recheck - should you require additional prescription for opiate type pain medication, that will need to come from your primary care doctor.  We are hopeful that combination of elavil, ibuprofen, and topical lidocaine will control your pain.   Return to ER if worse, new symptoms, fevers, trouble breathing, other concern.

## 2018-10-30 ENCOUNTER — Encounter (HOSPITAL_COMMUNITY): Payer: Medicaid Other

## 2018-10-31 ENCOUNTER — Encounter: Payer: Self-pay | Admitting: Hematology and Oncology

## 2018-10-31 ENCOUNTER — Telehealth: Payer: Self-pay

## 2018-10-31 ENCOUNTER — Inpatient Hospital Stay: Payer: Medicaid Other

## 2018-10-31 ENCOUNTER — Inpatient Hospital Stay (HOSPITAL_BASED_OUTPATIENT_CLINIC_OR_DEPARTMENT_OTHER): Payer: Medicaid Other | Admitting: Hematology and Oncology

## 2018-10-31 VITALS — BP 118/79 | HR 101 | Temp 98.1°F | Resp 18 | Ht 65.5 in | Wt 125.4 lb

## 2018-10-31 DIAGNOSIS — Z923 Personal history of irradiation: Secondary | ICD-10-CM

## 2018-10-31 DIAGNOSIS — Z87891 Personal history of nicotine dependence: Secondary | ICD-10-CM

## 2018-10-31 DIAGNOSIS — Z9119 Patient's noncompliance with other medical treatment and regimen: Secondary | ICD-10-CM

## 2018-10-31 DIAGNOSIS — C8128 Mixed cellularity classical Hodgkin lymphoma, lymph nodes of multiple sites: Secondary | ICD-10-CM

## 2018-10-31 DIAGNOSIS — Z5189 Encounter for other specified aftercare: Secondary | ICD-10-CM | POA: Insufficient documentation

## 2018-10-31 DIAGNOSIS — D701 Agranulocytosis secondary to cancer chemotherapy: Secondary | ICD-10-CM

## 2018-10-31 DIAGNOSIS — D649 Anemia, unspecified: Secondary | ICD-10-CM

## 2018-10-31 DIAGNOSIS — Z79899 Other long term (current) drug therapy: Secondary | ICD-10-CM | POA: Insufficient documentation

## 2018-10-31 DIAGNOSIS — Z5111 Encounter for antineoplastic chemotherapy: Secondary | ICD-10-CM

## 2018-10-31 DIAGNOSIS — Z9114 Patient's other noncompliance with medication regimen: Secondary | ICD-10-CM

## 2018-10-31 DIAGNOSIS — C8120 Mixed cellularity classical Hodgkin lymphoma, unspecified site: Secondary | ICD-10-CM | POA: Diagnosis not present

## 2018-10-31 DIAGNOSIS — D696 Thrombocytopenia, unspecified: Secondary | ICD-10-CM | POA: Insufficient documentation

## 2018-10-31 DIAGNOSIS — T451X5A Adverse effect of antineoplastic and immunosuppressive drugs, initial encounter: Secondary | ICD-10-CM

## 2018-10-31 DIAGNOSIS — D6481 Anemia due to antineoplastic chemotherapy: Secondary | ICD-10-CM

## 2018-10-31 DIAGNOSIS — R188 Other ascites: Secondary | ICD-10-CM

## 2018-10-31 LAB — CBC WITH DIFFERENTIAL/PLATELET
ABS IMMATURE GRANULOCYTES: 0.2 10*3/uL — AB (ref 0.00–0.07)
BASOS ABS: 0 10*3/uL (ref 0.0–0.1)
Basophils Relative: 0 %
EOS ABS: 0 10*3/uL (ref 0.0–0.5)
Eosinophils Relative: 0 %
HCT: 22.5 % — ABNORMAL LOW (ref 36.0–46.0)
Hemoglobin: 7.1 g/dL — ABNORMAL LOW (ref 12.0–15.0)
IMMATURE GRANULOCYTES: 4 %
LYMPHS ABS: 0.3 10*3/uL — AB (ref 0.7–4.0)
LYMPHS PCT: 5 %
MCH: 30 pg (ref 26.0–34.0)
MCHC: 31.6 g/dL (ref 30.0–36.0)
MCV: 94.9 fL (ref 80.0–100.0)
Monocytes Absolute: 0.6 10*3/uL (ref 0.1–1.0)
Monocytes Relative: 11 %
NEUTROS ABS: 4 10*3/uL (ref 1.7–7.7)
NEUTROS PCT: 80 %
NRBC: 0 % (ref 0.0–0.2)
PLATELETS: 153 10*3/uL (ref 150–400)
RBC: 2.37 MIL/uL — ABNORMAL LOW (ref 3.87–5.11)
RDW: 15.1 % (ref 11.5–15.5)
WBC: 5 10*3/uL (ref 4.0–10.5)

## 2018-10-31 LAB — CMP (CANCER CENTER ONLY)
ALBUMIN: 3 g/dL — AB (ref 3.5–5.0)
ALT: <6 U/L (ref 0–44)
ANION GAP: 9 (ref 5–15)
AST: 10 U/L — ABNORMAL LOW (ref 15–41)
Alkaline Phosphatase: 126 U/L (ref 38–126)
BUN: 7 mg/dL (ref 6–20)
CALCIUM: 9.1 mg/dL (ref 8.9–10.3)
CHLORIDE: 104 mmol/L (ref 98–111)
CO2: 29 mmol/L (ref 22–32)
Creatinine: 0.75 mg/dL (ref 0.44–1.00)
GFR, Est AFR Am: >60 mL/min (ref >60)
GFR, Estimated: >60 mL/min (ref >60)
GLUCOSE: 81 mg/dL (ref 70–99)
POTASSIUM: 4 mmol/L (ref 3.5–5.1)
SODIUM: 142 mmol/L (ref 135–145)
Total Bilirubin: 0.2 mg/dL — ABNORMAL LOW (ref 0.3–1.2)
Total Protein: 6 g/dL — ABNORMAL LOW (ref 6.5–8.1)

## 2018-10-31 LAB — SEDIMENTATION RATE: SED RATE: 26 mm/h — AB (ref 0–22)

## 2018-10-31 LAB — ABO/RH: ABO/RH(D): A POS

## 2018-10-31 NOTE — Telephone Encounter (Signed)
Printed avs and calender of upcoming appointment. Per 11/5 los 

## 2018-10-31 NOTE — Telephone Encounter (Signed)
Received call from [place] at [facility] requesting PFTs to be faxed for mutual pt. Additional question from physician as to whether Dr. Irene Limbo would like for pt to have one or two cycles of ICE. Per last OV note, pt to discuss with Dr. Audelia Hives and determine if cycle 2 to be scheduled. Based on LOS and notes left from Dr. Irene Limbo, pt was intended to be scheduled for OV on 10/30/18 with Dr. Audelia Hives prior to inpt chemo 11/4 through 11/6. No note documenting that bed placement was called to schedule cycle 2 of ICE inpt. Dr. Audelia Hives seeing pt today. Appt for blood tomorrow. Discussed pt situation with Ardyth Harps, RN and Dr. Audelia Hives. MD to determine how to proceed and let Erasmo Downer or this RN know what to schedule. Fax sent to [facility] with PFTs to [phone]. Confirmed fax receipt 10/31/18 at 1102.

## 2018-10-31 NOTE — Telephone Encounter (Signed)
Faxed PFT interpretation to Katie at [facility] per her MD request to [phone]. Confirmed fax receipt 10/31/18 at 1544.

## 2018-10-31 NOTE — Patient Instructions (Addendum)
The results of your laboratory studies from today suggest prolonged anemia following cycle 1 of chemotherapy.  You are scheduled for 2 units of packed red blood cells on November 6.  On November 11, day 1 cycle 2 treatment will be given in the [place].  On November 12 and November 13, day 2 and day 3 of cycle 2 of treatment will be given.  Following cycle 2 of treatment on day 4, as an outpatient, Neulasta will be given by subcutaneous injection in the [place].  Continue the lidocaine ointment as prescribed through the emergency department.  It is recommended that she continue gabapentin: 300 mg 3 times daily.  A follow-up appointment has been scheduled with Dr. Irene Limbo on November 19 with labs prior to that visit.

## 2018-10-31 NOTE — Progress Notes (Signed)
Hematology/Oncology Outpatient Progress Note October 31, 2018   HEMATOLOGY ONCOLOGY PROGRESS NOTE  Date of service:   10/31/18    Chief complaint: Pretreatment assessment evaluation prior to administration of cycle 2 of ifosfamide/mesna, etoposide, and carboplatin in the setting of refractory stage IV, mixed cellularity Hodgkin lymphoma as previously outlined by Dr. Irene Limbo.  Diagnosis:  Refractory Mixed cellularity Hodgkin's lymphoma IVBE with extensive lymphadenopathy including right axillary, mediastinal and upper retroperitoneal and now biopsy proven pulmonary involvement. She was noted to have significant constitutional symptoms including significant weight loss, fevers chills and some night sweats.  Current Treatment:  Previously, she was lost to f/u for >38yr Beginning October 09, 2018 she was started on salvage chemotherapy w/ICE.  Previous treatment 5 cycles of AVD (without bleomycin due to lung involvement and DLCO of 37%, active smoker) Multiple avoidable treatment delays due to the patient's noncompliance with follow-up for avoidable reasons. She has been counseled repeatedly that this would increase the likelihood of unfavorable outcome.  2nd line therapy with Bendamustine + Brentuximab s/p 7 cycles.  INTERVAL HISTORY: -Following cycle 1 of therapy, she was discharged from [facility] on October 17.  Following that treatment she overall did well.  -She was supposed to get lab work and start treatment yesterday but did not show for her appointment. -She continues to complain of a burning sensation at the site of her previous shingles which became acutely worse prompting visits to the emergency department on October 23 and October 25.   -Since her last visit, she was seen again in the emergency department on November 3. -Most recently, she was started on lidocaine patch and Elavil.  She does not use it. -She has been using Percocet as given to her by the  emergency department. -She is here for follow-up for her Hodgkins lymphoma in anticipation of cycle 2 of therapy.  -Her last visit was on October 24, 2018 with Dr. Irene Limbo. -At that time she was given gabapentin: 300 mg twice daily along with valacyclovir.  She no longer takes gabapentin.  She says it does not work. -It is currently dry and still crusted. -Since her last visit, she denies any appetite or weight deficit.  There is no fever, shaking chills, sweats, or flulike symptoms.  She has no bleeding tendency.  She denies any pain in or difficulty in swallowing. -She has no nausea, vomiting, diarrhea, or constipation.  There is no urinary frequency, urgency, hematuria, or dysuria.  Her energy level is low. -She continues to be short of breath on exertion.  Past Medical History:  Diagnosis Date  . ARDS (adult respiratory distress syndrome) ([place])   . Asthma   . Hodgkin lymphoma ([place])   . Hypertension    Past Surgical History:  Procedure Laterality Date  . AXILLARY LYMPH NODE BIOPSY Right 03/19/2016   Procedure: AXILLARY LYMPH NODE BIOPSY;  Surgeon: Armandina Gemma, MD;  Location: [facility];  Service: General;  Laterality: Right;  . IR GENERIC HISTORICAL  12/22/2016   IR FLUORO GUIDE PORT INSERTION LEFT 12/22/2016 [facility]  . IR GENERIC HISTORICAL  12/22/2016   IR US GUIDE VASC ACCESS LEFT 12/22/2016 [facility]  . IR GENERIC HISTORICAL  12/22/2016   IR CV LINE INJECTION 12/22/2016 [facility]  . IR GENERIC HISTORICAL  12/22/2016   IR REMOVAL TUN ACCESS W/ PORT W/O FL MOD SED 12/22/2016 [facility]  . VIDEO BRONCHOSCOPY Bilateral 11/26/2016   Procedure: VIDEO BRONCHOSCOPY WITH FLUORO;  Surgeon: Rigoberto Noel, MD;  Location: [facility] END[facility];  Service: Cardiopulmonary;  Laterality: Bilateral;   Social History   Tobacco Use  . Smoking status: Former Smoker    Packs/day: 0.50    Years: 15.00    Pack years: 7.50    Types: Cigarettes    Last attempt to quit: 03/27/2016    Years since  quitting: 2.5  . Smokeless tobacco: Never Used  Substance Use Topics  . Alcohol use: Yes    Comment: occasional now  . Drug use: Not on file   ALLERGIES:  has No Known Allergies.  MEDICATIONS:  Current Outpatient Medications  Medication Sig Dispense Refill  . acyclovir (ZOVIRAX) 400 MG tablet Take 1 tablet (400 mg total) by mouth 2 (two) times daily. Start after completion of Valtrex 60 tablet 6  . amitriptyline (ELAVIL) 10 MG tablet Take 1 tablet (10 mg total) by mouth at bedtime. 20 tablet 0  . gabapentin (NEURONTIN) 300 MG capsule Take 1 capsule (300 mg total) by mouth 2 (two) times daily. 60 capsule 0  . lidocaine (XYLOCAINE) 5 % ointment Apply 1 application topically as needed. For severe pain from shingles 35.44 g 0  . lidocaine-prilocaine (EMLA) cream Apply 1 application topically as needed (numbing). (Patient not taking: Reported on 10/09/2018) 30 g 1  . LORazepam (ATIVAN) 0.5 MG tablet Take 1 tablet (0.5 mg total) by mouth every 6 (six) hours as needed (Nausea or vomiting). 30 tablet 0  . ondansetron (ZOFRAN) 8 MG tablet Take 1 tablet (8 mg total) by mouth 2 (two) times daily as needed (Nausea or vomiting). 30 tablet 1  . oxyCODONE-acetaminophen (PERCOCET/ROXICET) 5-325 MG tablet Take 1-2 tablets by mouth every 8 (eight) hours as needed for severe pain. 60 tablet 0  . oxyCODONE-acetaminophen (PERCOCET/ROXICET) 5-325 MG tablet Take 1-2 tablets by mouth every 6 (six) hours as needed for severe pain. 10 tablet 0  . potassium chloride SA (K-DUR,KLOR-CON) 20 MEQ tablet Take 1 tablet (20 mEq total) by mouth daily. 3 tablet 0  . prochlorperazine (COMPAZINE) 10 MG tablet Take 1 tablet (10 mg total) by mouth every 6 (six) hours as needed (Nausea or vomiting). 30 tablet 1  . valACYclovir (VALTREX) 1000 MG tablet Take 1 tablet (1,000 mg total) by mouth 3 (three) times daily. 20 tablet 0   No current facility-administered medications for this visit.    CURRENT LABORATORY DATA October 31, 2018  Ref Range & Units 09:37 (10/31/18) 7d ago (10/24/18) 11d ago (10/20/18) 2wk ago (10/12/18) 2wk ago (10/11/18)  WBC 4.0 - 10.5 K/uL 5.0  7.3  2.2Low   13.0High   12.2High    RBC 3.87 - 5.11 MIL/uL 2.37Low   2.74Low   2.82Low   3.58Low   3.54Low    Hemoglobin 12.0 - 15.0 g/dL 7.1Low   8.2Low   8.4Low   10.7Low   10.7Low    HCT 36.0 - 46.0 % 22.5Low   25.3Low   26.1Low   35.1Low   34.2Low    MCV 80.0 - 100.0 fL 94.9  92.3  92.6  98.0  96.6   MCH 26.0 - 34.0 pg 30.0  29.9  29.8  29.9  30.2   MCHC 30.0 - 36.0 g/dL 31.6  32.4  32.2  30.5  31.3   RDW 11.5 - 15.5 % 15.1  14.0  13.8  14.3  13.9   Platelets 150 - 400 K/uL 153  16Low  CM 33Low  CM 513High   501High    nRBC 0.0 - 0.2 %  0.0  0.0  0.0  0.0 CM 0.0 CM  Neutrophils Relative % % 80  86  89     Neutro Abs 1.7 - 7.7 K/uL 4.0  6.2  1.9     Lymphocytes Relative % 5  2  1      Lymphs Abs 0.7 - 4.0 K/uL 0.3Low   0.2Low   0.0Low      Monocytes Relative % 11  7  5      Monocytes Absolute 0.1 - 1.0 K/uL 0.6  0.5  0.1     Eosinophils Relative % 0  0  0     Eosinophils Absolute 0.0 - 0.5 K/uL 0.0  0.0  0.0     Basophils Relative % 0  0  1     Basophils Absolute 0.0 - 0.1 K/uL 0.0  0.0  0.0     Immature Granulocytes % 4  5 CM 4     Abs Immature Granulocytes 0.00 - 0.07 K/uL 0.20High   0.35High  CM 0.08High  CM      REVIEW OF SYSTEMS:   Constitutional: No fever, sweats, or shaking chills. No appetite or weight deficit.  Energy level is stable. Skin: No scaling, sores, lumps, or jaundice.  Dry mildly crusted herpetic lesion below the left axilla without blistering or bullae. HEENT: No visual changes or hearing deficit. Pulmonary: No unusual cough, sore throat, or orthopnea.  DOE.  Bronchial asthma. Breasts: No complaints. Cardiovascular: No coronary artery disease, angina, or myocardial infarction.  No cardiac dysrhythmia; essential hypertension; no dyslipidemia. Gastrointestinal: No indigestion, dysphagia, abdominal pain, diarrhea, or  constipation.  No change in bowel habits; no nausea or vomiting; no melena or bright red blood per rectum; no pain on swallowing. Genitourinary:  No urinary frequency, urgency, hematuria, or dysuria.   Musculoskeletal:  No arthralgias or myalgias; no joint swelling or instability. Hematologic: No bleeding tendency or easy bruisability. Endocrine: No intolerance to hot or cold.  No thyroid disease or diabetes mellitus. Vascular: No peripheral arterial or venous thromboembolic disease. Psychological:  No anxiety disorder or depression.  No mood changes. Neurological: No dizziness, lightheadedness, syncope, or near syncopal episodes; no numbness or tingling in the fingers or toes.  PHYSICAL EXAMINATION: ECOG PERFORMANCE STATUS: 1 - Symptomatic but completely ambulatory  Vitals:   10/31/18 1140  BP: 118/79  Pulse: (!) 101  Resp: 18  Temp: 98.1 F (36.7 C)  SpO2: 100%   Filed Weights   10/31/18 1140  Weight: 125 lb 6.4 oz (56.9 kg)   .Body mass index is 20.55 kg/m. GENERAL:alert, in no acute distress and comfortable SKIN: Numerous scars over her upper and lower extremity at the site of previous bites or skin lesions.  There is a mildly crusted residual postherpetic scarring in the inferior left axilla. EYES: conjunctiva are pink and non-injected, sclera anicteric OROPHARYNX: MMM, no exudates, no oropharyngeal erythema or ulceration NECK: supple, no JVD LYMPH:  no palpable lymphadenopathy in the cervical, axillary or inguinal regions LUNGS: scattered fine crackles and rhonchi bilaterally, more on the right lung base HEART: regular rate & rhythm ABDOMEN:  normoactive bowel sounds , non tender, not distended. No palpable hepatosplenomegaly.  Extremity: no pedal edema PSYCH: alert & oriented x 3 with fluent speech NEURO: no focal motor/sensory deficits   RADIOGRAPHIC STUDIES: I have personally reviewed the radiological images as listed and agreed with the findings in the report. Dg  Chest 2 View  Result Date: 10/20/2018 CLINICAL DATA:  Chest pain EXAM: CHEST - 2  VIEW COMPARISON:  PET CT 09/26/2018 FINDINGS: There is an accessed left chest wall Port-A-Cath the follows a left internal jugular vein approach to terminate in the lower SVC. Cardiomediastinal contours are normal. There is large area of consolidation in the right mid lung with mild airspace opacity in the left mid lung. No pleural effusion or pneumothorax. IMPRESSION: Moderate right and mild left mid lung airspace opacities, which may be secondary to the patient's lymphoma, though superimposed infectious pneumonia could also have this appearance. Electronically Signed   By: Ulyses Jarred M.D.   On: 10/20/2018 20:00   SUMMARY/ASSESSMENT:  #1 Refractory/Progressive Mixed cellularity Hodgkin's lymphoma IV B Ewith extensive lymphadenopathy including right axillary, mediastinal and upper retroperitoneal and now biopsy proven pulmonary involvement. She was noted to have significant constitutional symptoms including significant weight loss, fevers chills and some night sweats. HIV negative Hepatitis C and hepatitis B serologies negative. Echo with normal ejection fraction. Patient was treated with 5 cycles of AVD (Bleomycin held due to poor DLCO 37% and ongoing smoking). Multiple avoidable treatment delays due to the patient's noncompliance with follow-up for avoidable reasons. She has been counseled repeatedly that this would increase the likelihood of unfavorable outcome. Cycle 1 of ICE was given at [facility] on October 14.  She tolerated treatment well without significant infectious or gastrointestinal ability. Following cycle 1 of treatment see pegfilgrastim: 6 mg subcutaneously as previously ordered. 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 S/p 6 cycles of 2nd line treatment with Bendamustine/Brentuximab And 1 cycle of Bretuximab alone  -PET/CT scan results from  04/14/2017 were discussed in details. She appears to have some persistent disease in her right lung at [place] 5. It is difficult to say if this is recurrent or persistent disease since the patient failed to follow-up on multiple scheduled PET/CT scans in the early part and prior to her second line treatment.  09/26/18 PET/CT revealed Imaging findings compatible with recurrence of disease. 2. Multiple new large areas of hypermetabolic nodularity and airspace consolidation within both lungs which is presumed to represent pulmonary involvement by lymphoma. Deauville criteria 5. 3. New hypermetabolic left supraclavicular, left retroperitoneal, and bilateral pelvic lymph nodes. Deauville criteria 5. 4. Multifocal hypermetabolic osseous lesions. Deauville criteria 5. 5. Small volume of ascites, new.   #2 s/p hypoxic respiratory failurewith dense right lung consolidation and left upper lobe consolidation with SVC syndrome. Patient has completed palliative radiation to the right lung mass causing SVC compression and has had her port removed. 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  #4 Non compliancewith clinic and treatment followup. Missed 2nd dose of bendamustine with C2. -Has missed multiple appointment for her PET/CT and missed appointment at [facility] for consideration of Transplant.  -Pt was lost to follow up after July 2018 and returned on 08/31/18 -We discussed again the goals of care. She appears ready to continue treatment with cycle 2 of ICE beginning November 11.  #5 h/o Grade 1 neuropathyfrom Brentuximab-Vedotin - resolved  #6 Shingles outbreak - curently -minimal crusting; postherpetic pain syndrome  # DVT prophylaxis with enoxaparin while hospitalized.  PLAN: - Pt had no prohibitive toxicities symptoms from C1 ICEand received adequate  IVF and also received mesna to avoid bladder issues from high dose ifosfamide. -Will continue Valtrex for shingles outbreak which has crusted over. -She had outpatient appointment at [facility] on  10/23/2018 for Auto HSCT consideration.  -According to [facility], they want to see the results of her pulmonary function testing prior to further consideration.  She has by report no scheduled follow-up. -The results of her laboratory studies from today were reviewed and discussed in detail.  Her hemoglobin is 7.1 g/dL.  Both white blood cell count and platelets are normal. -We will transfuse 2 units PRBCs to be given today. -She is scheduled to have laboratory studies done prior to the start of cycle 2 of therapy on November 11.  Urine will be obtained for both pregnancy test and drug screen.  She will be seen briefly in the [place] prior to treatment. -Day 1, cycle 2 of frusemide, mesna, carboplatin, and etoposide will begin on November 11 as an outpatient in the [place]. -She will be admitted on day 2 and 3 of treatment (November 12/13) for continued ifosfamide/mesna and carboplatin. -On day 4 of therapy, November 14, pegfilgrastim: 6 mg subcutaneously will be administered in the [place]. -Will repeat pulmonary function test after C2 of ICE -It was recommended that she continue lidocaine ointment topically to the involved area of her postherpetic pain, continue valacyclovir, and increase gabapentin: 300 mg 3 times daily. -Barring any unforeseen complications, her next scheduled doctor visit following cycle 2 of ICE is on November 19 with Dr. Irene Limbo.  The total time spent discussing the her most recent laboratory studies, physical examination, role and rationale for continued, rationale for PRBCs prior to chemotherapy given her profound anemia, new schedule due to her missing her last appointment and recommendations was 25 minutes.  At least 50% of that time was spent in face-to-face  discussion, counseling, and answering questions. There was ample time allotted to answer all questions.  This note was dictated using voice activated technology/software.  Unfortunately, typographical errors are not uncommon, and transcription is subject to mistakes and regrettably misinterpretation.  If necessary, clarification of the above information can be discussed with me at any time.   Henreitta Leber, MD  Hematology/Oncology [place] [address] [place], [place] [address] Office: [phone] JGOT: [phone]     .Henreitta Leber MD

## 2018-11-01 ENCOUNTER — Inpatient Hospital Stay: Payer: Medicaid Other

## 2018-11-01 ENCOUNTER — Telehealth: Payer: Self-pay | Admitting: *Deleted

## 2018-11-01 VITALS — BP 134/90 | HR 79 | Temp 98.3°F | Resp 16

## 2018-11-01 DIAGNOSIS — D696 Thrombocytopenia, unspecified: Secondary | ICD-10-CM

## 2018-11-01 DIAGNOSIS — Z95828 Presence of other vascular implants and grafts: Secondary | ICD-10-CM

## 2018-11-01 DIAGNOSIS — D6481 Anemia due to antineoplastic chemotherapy: Secondary | ICD-10-CM

## 2018-11-01 DIAGNOSIS — C8128 Mixed cellularity classical Hodgkin lymphoma, lymph nodes of multiple sites: Secondary | ICD-10-CM

## 2018-11-01 DIAGNOSIS — Z7189 Other specified counseling: Secondary | ICD-10-CM

## 2018-11-01 DIAGNOSIS — T451X5A Adverse effect of antineoplastic and immunosuppressive drugs, initial encounter: Secondary | ICD-10-CM

## 2018-11-01 LAB — PREPARE RBC (CROSSMATCH)

## 2018-11-01 MED ORDER — DIPHENHYDRAMINE HCL 25 MG PO CAPS
25.0000 mg | ORAL_CAPSULE | Freq: Once | ORAL | Status: AC
  Administered 2018-11-01: 25 mg via ORAL

## 2018-11-01 MED ORDER — ACETAMINOPHEN 325 MG PO TABS
ORAL_TABLET | ORAL | Status: AC
  Filled 2018-11-01: qty 2

## 2018-11-01 MED ORDER — SODIUM CHLORIDE 0.9 % IV SOLN
Freq: Once | INTRAVENOUS | Status: AC
  Administered 2018-11-01: 09:00:00 via INTRAVENOUS
  Filled 2018-11-01: qty 250

## 2018-11-01 MED ORDER — SODIUM CHLORIDE 0.9% FLUSH
10.0000 mL | INTRAVENOUS | Status: DC | PRN
  Administered 2018-11-01: 10 mL
  Filled 2018-11-01: qty 10

## 2018-11-01 MED ORDER — HEPARIN SOD (PORK) LOCK FLUSH 100 UNIT/ML IV SOLN
500.0000 [IU] | Freq: Once | INTRAVENOUS | Status: AC | PRN
  Administered 2018-11-01: 500 [IU]
  Filled 2018-11-01: qty 5

## 2018-11-01 MED ORDER — ACETAMINOPHEN 325 MG PO TABS
650.0000 mg | ORAL_TABLET | Freq: Once | ORAL | Status: AC
  Administered 2018-11-01: 650 mg via ORAL

## 2018-11-01 MED ORDER — DIPHENHYDRAMINE HCL 25 MG PO CAPS
ORAL_CAPSULE | ORAL | Status: AC
  Filled 2018-11-01: qty 1

## 2018-11-01 NOTE — Telephone Encounter (Signed)
Contacted [facility] in bed placement to schedule inpatient admission for 11/12 and 11/13 as requested by Dr. Audelia Hives. Contacted patient to let her know to expect call from hospital. Patient verbalized understanding.

## 2018-11-01 NOTE — Patient Instructions (Signed)

## 2018-11-02 LAB — TYPE AND SCREEN
ABO/RH(D): A POS
Antibody Screen: NEGATIVE
UNIT DIVISION: 0
UNIT DIVISION: 0

## 2018-11-02 LAB — BPAM RBC
BLOOD PRODUCT EXPIRATION DATE: 201912042359
BLOOD PRODUCT EXPIRATION DATE: 201912042359
ISSUE DATE / TIME: 201911060855
ISSUE DATE / TIME: 201911060855
UNIT TYPE AND RH: 6200
Unit Type and Rh: 6200

## 2018-11-06 ENCOUNTER — Inpatient Hospital Stay: Payer: Medicaid Other

## 2018-11-07 ENCOUNTER — Encounter: Payer: Self-pay | Admitting: Hematology and Oncology

## 2018-11-07 ENCOUNTER — Inpatient Hospital Stay (HOSPITAL_BASED_OUTPATIENT_CLINIC_OR_DEPARTMENT_OTHER): Payer: Medicaid Other | Admitting: Hematology and Oncology

## 2018-11-07 ENCOUNTER — Ambulatory Visit: Payer: Medicaid Other

## 2018-11-07 ENCOUNTER — Other Ambulatory Visit: Payer: Self-pay

## 2018-11-07 ENCOUNTER — Telehealth: Payer: Self-pay | Admitting: Hematology

## 2018-11-07 ENCOUNTER — Encounter (HOSPITAL_COMMUNITY): Payer: Self-pay

## 2018-11-07 ENCOUNTER — Observation Stay (HOSPITAL_COMMUNITY)
Admission: RE | Admit: 2018-11-07 | Disposition: A | Discharge status: Home / Self Care | Payer: Medicaid Other | Source: Ambulatory Visit | Admitting: Hematology and Oncology

## 2018-11-07 ENCOUNTER — Inpatient Hospital Stay: Payer: Medicaid Other

## 2018-11-07 DIAGNOSIS — Z5111 Encounter for antineoplastic chemotherapy: Secondary | ICD-10-CM

## 2018-11-07 DIAGNOSIS — C8128 Mixed cellularity classical Hodgkin lymphoma, lymph nodes of multiple sites: Secondary | ICD-10-CM

## 2018-11-07 DIAGNOSIS — C8120 Mixed cellularity classical Hodgkin lymphoma, unspecified site: Secondary | ICD-10-CM

## 2018-11-07 LAB — CBC WITH DIFFERENTIAL (CANCER CENTER ONLY)
Abs Immature Granulocytes: 0.01 10*3/uL (ref 0.00–0.07)
BASOS ABS: 0 10*3/uL (ref 0.0–0.1)
Basophils Relative: 0 %
Eosinophils Absolute: 0 10*3/uL (ref 0.0–0.5)
Eosinophils Relative: 0 %
HCT: 33.2 % — ABNORMAL LOW (ref 36.0–46.0)
HEMOGLOBIN: 10.6 g/dL — AB (ref 12.0–15.0)
IMMATURE GRANULOCYTES: 0 %
LYMPHS ABS: 0.1 10*3/uL — AB (ref 0.7–4.0)
Lymphocytes Relative: 3 %
MCH: 30.2 pg (ref 26.0–34.0)
MCHC: 31.9 g/dL (ref 30.0–36.0)
MCV: 94.6 fL (ref 80.0–100.0)
Monocytes Absolute: 0.8 10*3/uL (ref 0.1–1.0)
Monocytes Relative: 15 %
NEUTROS ABS: 4.4 10*3/uL (ref 1.7–7.7)
NEUTROS PCT: 82 %
NRBC: 0 % (ref 0.0–0.2)
PLATELETS: 473 10*3/uL — AB (ref 150–400)
RBC: 3.51 MIL/uL — AB (ref 3.87–5.11)
RDW: 17.2 % — ABNORMAL HIGH (ref 11.5–15.5)
WBC: 5.4 10*3/uL (ref 4.0–10.5)

## 2018-11-07 LAB — PREGNANCY, URINE: PREG TEST UR: NEGATIVE

## 2018-11-07 LAB — COMPREHENSIVE METABOLIC PANEL
ALBUMIN: 3.3 g/dL — AB (ref 3.5–5.0)
ALT: 10 U/L (ref 0–44)
ANION GAP: 7 (ref 5–15)
AST: 12 U/L — AB (ref 15–41)
Alkaline Phosphatase: 122 U/L (ref 38–126)
BILIRUBIN TOTAL: 0.4 mg/dL (ref 0.3–1.2)
BUN: 6 mg/dL (ref 6–20)
CO2: 26 mmol/L (ref 22–32)
Calcium: 9.3 mg/dL (ref 8.9–10.3)
Chloride: 107 mmol/L (ref 98–111)
Creatinine, Ser: 0.66 mg/dL (ref 0.44–1.00)
GFR calc Af Amer: >60 mL/min (ref >60)
GFR calc non Af Amer: >60 mL/min (ref >60)
GLUCOSE: 82 mg/dL (ref 70–99)
POTASSIUM: 3.9 mmol/L (ref 3.5–5.1)
SODIUM: 140 mmol/L (ref 135–145)
Total Protein: 6.8 g/dL (ref 6.5–8.1)

## 2018-11-07 LAB — PHOSPHORUS: Phosphorus: 4.3 mg/dL (ref 2.5–4.6)

## 2018-11-07 LAB — MAGNESIUM: Magnesium: 1.8 mg/dL (ref 1.7–2.4)

## 2018-11-07 NOTE — Progress Notes (Signed)
  Hematology/Oncology Outpatient Progress Note  [name] DOB: [birth year]  Date of Service: November 07, 2018  Heidi [name] missed her appointment yesterday to start etoposide as part of the ICE regimen. Laboratory studies were obtained today. Those results were reviewed and discussed in detail.  Because she missed her menses last month, a pregnancy test was performed. She previously had been menstruating on a regular basis. It was negative.  Last week she was transfused 2 units of packed red blood cells. CBC: Hgb 10.6 HCT 33.2 WBC 5.4; PLT 473. Her labs from today were reviewed and discussed. She was offered to start treatment tomorrow.   She wishes to defer cycle 2 until next week. An appointment will be scheduled over the telephone to begin treatment next week. Dr. Grier Mitts nurse was notified. The pharmacy and hospital were notified. Questions were answered to her satisfaction.  FOLLOW UP: AS DIRECTED   Henreitta Leber, MD  Hematology/Oncology [facility] [address] [place], [place] [address] Office: [phone] VPCH: [phone]

## 2018-11-07 NOTE — Telephone Encounter (Signed)
Called regarding 11/18 °

## 2018-11-08 ENCOUNTER — Ambulatory Visit: Payer: Medicaid Other

## 2018-11-09 ENCOUNTER — Ambulatory Visit: Payer: Medicaid Other

## 2018-11-09 LAB — DRUG SCREEN 10 W/CONF, SERUM
Amphetamines, IA: NEGATIVE ng/mL
Barbiturates, IA: NEGATIVE ug/mL
Benzodiazepines, IA: NEGATIVE ng/mL
COCAINE & METABOLITE, IA: NEGATIVE ng/mL
Methadone, IA: NEGATIVE ng/mL
OXYCODONES, IA: NEGATIVE ng/mL
Opiates, IA: NEGATIVE ng/mL
PHENCYCLIDINE, IA: NEGATIVE ng/mL
PROPOXYPHENE, IA: NEGATIVE ng/mL
THC(MARIJUANA) METABOLITE, IA: NEGATIVE ng/mL

## 2018-11-10 NOTE — Progress Notes (Signed)
[name] Kitchen  HEMATOLOGY ONCOLOGY PROGRESS NOTE  Date of service:   11/13/18     Patient Care Team: Clinic, General Medical as PCP - General (Family Medicine)  Chief complaint: Follow-up for Hodgkin's lymphoma  Diagnosis:   Refractory Mixed cellularity Hodgkin's lymphoma IVBE with extensive lymphadenopathy including right axillary, mediastinal and upper retroperitoneal and now biopsy proven pulmonary involvement. She was noted to have significant constitutional symptoms including significant weight loss, fevers chills and some night sweats.  Current Treatment: Third line therapy ICE for 2 planned cycles.  She was lost to f/u for >79yr  Previous treatment 5 cycles of AVD (without bleomycin due to lung involvement and DLCO of 37%, active smoker) Multiple avoidable treatment delays due to the patient's noncompliance with follow-up for avoidable reasons. She has been counseled repeatedly that this would increase the likelihood of unfavorable outcome.  2nd line therapy with Bendamustine + Brentuximab s/p 7 cycles.  INTERVAL HISTORY:  Ms State is here for follow-up for her Hodgkins lymphoma. The patient's last visit with [place] was on 10/24/18, and she did see my colleague Dr. Audelia Hives in the interim. The pt reports that she is doing well overall.   In the interim, the patient was scheduled to begin C2D1 ICE, but did not appear for this treatment due to transportation difficulty. The pt last saw Dr. Jolayne Haines at [facility] regarding consideration of auto SCT on 10/23/18, and will continue discussion after completion of C2, a repeat PFT and PET/CT.   The pt reports that she has continued to have some pain related to her Shingles, and reports her pain as 8 or 9 out of 10. She notes this is an improvement and continues Amitriptyline and Gabapentin. The pt notes that her pain is intermittently worse.   The pt notes that she has not needed to use oxygen in the interim and is able to walk "as much as she wants"  without feeling SOB.   Lab results (11/07/18) of CBC w/diff and CMP is as follows: all values are WNL except for RBC at 3.51, HGB at 10.6, HCT at 33.2, RDW at 17.2, PLT at 473k, Lymphs abs at 100, Albumin at 3.3, AST at 12.  On review of systems, pt reports shingles related pain, stable energy levels, and denies fevers, chills, concerns of infections, difficulty breathing, new lumps or bumps, abdominal pain, leg swelling, and any other symptoms.   REVIEW OF SYSTEMS:    A 10+ POINT REVIEW OF SYSTEMS WAS OBTAINED including neurology, dermatology, psychiatry, cardiac, respiratory, lymph, extremities, GI, GU, Musculoskeletal, constitutional, breasts, reproductive, HEENT.  All pertinent positives are noted in the HPI.  All others are negative.  . Past Medical History:  Diagnosis Date  . ARDS (adult respiratory distress syndrome) ([place])   . Asthma   . Hodgkin lymphoma ([place])   . Hypertension     . Past Surgical History:  Procedure Laterality Date  . AXILLARY LYMPH NODE BIOPSY Right 03/19/2016   Procedure: AXILLARY LYMPH NODE BIOPSY;  Surgeon: Armandina Gemma, MD;  Location: [facility];  Service: General;  Laterality: Right;  . IR GENERIC HISTORICAL  12/22/2016   IR FLUORO GUIDE PORT INSERTION LEFT 12/22/2016 [facility]  . IR GENERIC HISTORICAL  12/22/2016   IR US GUIDE VASC ACCESS LEFT 12/22/2016 [facility]  . IR GENERIC HISTORICAL  12/22/2016   IR CV LINE INJECTION 12/22/2016 [facility]  . IR GENERIC HISTORICAL  12/22/2016   IR REMOVAL TUN ACCESS W/ PORT W/O FL MOD SED 12/22/2016 [facility]  .  VIDEO BRONCHOSCOPY Bilateral 11/26/2016   Procedure: VIDEO BRONCHOSCOPY WITH FLUORO;  Surgeon: Rigoberto Noel, MD;  Location: [facility] ENDOSCOPY;  Service: Cardiopulmonary;  Laterality: Bilateral;    . Social History   Tobacco Use  . Smoking status: Former Smoker    Packs/day: 0.50    Years: 15.00    Pack years: 7.50    Types: Cigarettes    Last attempt to quit: 03/27/2016    Years since  quitting: 2.6  . Smokeless tobacco: Never Used  Substance Use Topics  . Alcohol use: Yes    Comment: occasional now  . Drug use: Not on file    ALLERGIES:  has No Known Allergies.  MEDICATIONS:  Current Outpatient Medications  Medication Sig Dispense Refill  . acyclovir (ZOVIRAX) 400 MG tablet Take 1 tablet (400 mg total) by mouth 2 (two) times daily. Start after completion of Valtrex 60 tablet 6  . amitriptyline (ELAVIL) 10 MG tablet Take 1 tablet (10 mg total) by mouth at bedtime. 20 tablet 0  . gabapentin (NEURONTIN) 300 MG capsule Take 1 capsule (300 mg total) by mouth 2 (two) times daily. 60 capsule 0  . lidocaine (XYLOCAINE) 5 % ointment Apply 1 application topically as needed. For severe pain from shingles (Patient not taking: Reported on 11/07/2018) 35.44 g 0  . lidocaine-prilocaine (EMLA) cream Apply 1 application topically as needed (numbing). (Patient not taking: Reported on 10/09/2018) 30 g 1  . LORazepam (ATIVAN) 0.5 MG tablet Take 1 tablet (0.5 mg total) by mouth every 6 (six) hours as needed (Nausea or vomiting). 30 tablet 0  . ondansetron (ZOFRAN) 8 MG tablet Take 1 tablet (8 mg total) by mouth 2 (two) times daily as needed (Nausea or vomiting). 30 tablet 1  . oxyCODONE-acetaminophen (PERCOCET/ROXICET) 5-325 MG tablet Take 1-2 tablets by mouth every 8 (eight) hours as needed for severe pain. 60 tablet 0  . oxyCODONE-acetaminophen (PERCOCET/ROXICET) 5-325 MG tablet Take 1-2 tablets by mouth every 6 (six) hours as needed for severe pain. 10 tablet 0  . potassium chloride SA (K-DUR,KLOR-CON) 20 MEQ tablet Take 1 tablet (20 mEq total) by mouth daily. 3 tablet 0  . prochlorperazine (COMPAZINE) 10 MG tablet Take 1 tablet (10 mg total) by mouth every 6 (six) hours as needed (Nausea or vomiting). 30 tablet 1  . valACYclovir (VALTREX) 1000 MG tablet Take 1 tablet (1,000 mg total) by mouth 3 (three) times daily. (Patient not taking: Reported on 11/07/2018) 20 tablet 0   No current  facility-administered medications for this visit.     PHYSICAL EXAMINATION: ECOG PERFORMANCE STATUS: 1 - Symptomatic but completely ambulatory  Vitals:   11/13/18 0948  BP: (!) 151/90  Pulse: (!) 102  Resp: 18  Temp: 98.1 F (36.7 C)  SpO2: 96%    Filed Weights   11/13/18 0948  Weight: 128 lb 11.2 oz (58.4 kg)   .Body mass index is 20.16 kg/m.  GENERAL:alert, in no acute distress and comfortable SKIN: no acute rashes, no significant lesions EYES: conjunctiva are pink and non-injected, sclera anicteric OROPHARYNX: MMM, no exudates, no oropharyngeal erythema or ulceration NECK: supple, no JVD LYMPH:  no palpable lymphadenopathy in the cervical, axillary or inguinal regions LUNGS: scattered fine crackles and rhonchi bilaterally, more on the right lung base HEART: regular rate & rhythm ABDOMEN:  normoactive bowel sounds , non tender, not distended. No palpable hepatosplenomegaly.  Extremity: no pedal edema PSYCH: alert & oriented x 3 with fluent speech NEURO: no focal motor/sensory deficits  LABORATORY DATA:   I have reviewed the data as listed  . CBC Latest Ref Rng & Units 11/07/2018 10/31/2018 10/24/2018  WBC 4.0 - 10.5 K/uL 5.4 5.0 7.3  Hemoglobin 12.0 - 15.0 g/dL 10.6(L) 7.1(L) 8.2(L)  Hematocrit 36.0 - 46.0 % 33.2(L) 22.5(L) 25.3(L)  Platelets 150 - 400 K/uL 473(H) 153 16(L)   . CBC    Component Value Date/Time   WBC 5.4 11/07/2018 1047   WBC 5.0 10/31/2018 0937   RBC 3.51 (L) 11/07/2018 1047   HGB 10.6 (L) 11/07/2018 1047   HGB 11.7 07/20/2017 1315   HCT 33.2 (L) 11/07/2018 1047   HCT 35.2 07/20/2017 1315   PLT 473 (H) 11/07/2018 1047   PLT 266 07/20/2017 1315   MCV 94.6 11/07/2018 1047   MCV 93.2 07/20/2017 1315   MCH 30.2 11/07/2018 1047   MCHC 31.9 11/07/2018 1047   RDW 17.2 (H) 11/07/2018 1047   RDW 14.5 07/20/2017 1315   LYMPHSABS 0.1 (L) 11/07/2018 1047   LYMPHSABS 0.3 (L) 07/20/2017 1315   MONOABS 0.8 11/07/2018 1047   MONOABS 0.5  07/20/2017 1315   EOSABS 0.0 11/07/2018 1047   EOSABS 0.2 07/20/2017 1315   BASOSABS 0.0 11/07/2018 1047   BASOSABS 0.0 07/20/2017 1315   . CMP Latest Ref Rng & Units 11/07/2018 10/31/2018 10/24/2018  Glucose 70 - 99 mg/dL 82 81 79  BUN 6 - 20 mg/dL 6 7 8   Creatinine 0.44 - 1.00 mg/dL 0.66 0.75 0.69  Sodium 135 - 145 mmol/L 140 142 143  Potassium 3.5 - 5.1 mmol/L 3.9 4.0 3.6  Chloride 98 - 111 mmol/L 107 104 102  CO2 22 - 32 mmol/L 26 29 30   Calcium 8.9 - 10.3 mg/dL 9.3 9.1 8.9  Total Protein 6.5 - 8.1 g/dL 6.8 6.0(L) 6.1(L)  Total Bilirubin 0.3 - 1.2 mg/dL 0.4 0.2(L) 0.4  Alkaline Phos 38 - 126 U/L 122 126 151(H)  AST 15 - 41 U/L 12(L) 10(L) 12(L)  ALT 0 - 44 U/L 10 <6 9     RADIOGRAPHIC STUDIES: I have personally reviewed the radiological images as listed and agreed with the findings in the report. Dg Chest 2 View  Result Date: 10/20/2018 CLINICAL DATA:  Chest pain EXAM: CHEST - 2 VIEW COMPARISON:  PET CT 09/26/2018 FINDINGS: There is an accessed left chest wall Port-A-Cath the follows a left internal jugular vein approach to terminate in the lower SVC. Cardiomediastinal contours are normal. There is large area of consolidation in the right mid lung with mild airspace opacity in the left mid lung. No pleural effusion or pneumothorax. IMPRESSION: Moderate right and mild left mid lung airspace opacities, which may be secondary to the patient's lymphoma, though superimposed infectious pneumonia could also have this appearance. Electronically Signed   By: Ulyses Jarred M.D.   On: 10/20/2018 20:00    ASSESSMENT & PLAN:   32 y.o. African-American female with  #1 Refractory/Progressive Mixed cellularity Hodgkin's lymphoma IV B Ewith extensive lymphadenopathy including right axillary, mediastinal and upper retroperitoneal and now biopsy proven pulmonary involvement. She was noted to have significant constitutional symptoms including significant weight loss, fevers chills and some night  sweats. HIV negative Hepatitis C and hepatitis B serologies negative. Echo with normal ejection fraction. Patient was treated with 5 cycles of AVD (Bleomycin held due to poor DLCO 37% and ongoing smoking). Multiple avoidable treatment delays due to the patient's noncompliance with follow-up for avoidable reasons. She has been counseled repeatedly that this would increase the likelihood of  unfavorable outcome.  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 S/p 6 cycles of 2nd line treatment with Bendamustine/Brentuximab And 1 cycle of Bretuximab alone  -PET/CT scan results from 04/14/2017 were discussed in details. She appears to have some persistent disease in her right lung at [place]au[place] 5. It is difficult to say if this is recurrent or persistent disease since the patient failed to follow-up on multiple scheduled PET/CT scans in the early part and prior to her second line treatment.  09/26/18 PET/CT revealed Imaging findings compatible with recurrence of disease. 2. Multiple new large areas of hypermetabolic nodularity and airspace consolidation within both lungs which is presumed to represent pulmonary involvement by lymphoma. Deauville criteria 5. 3. New hypermetabolic left supraclavicular, left retroperitoneal, and bilateral pelvic lymph nodes. Deauville criteria 5. 4. Multifocal hypermetabolic osseous lesions. Deauville criteria 5. 5. Small volume of ascites, new.   #2 s/p hypoxic respiratory failurewith dense right lung consolidation and left upper lobe consolidation with SVC syndrome. Patient has completed palliative radiation to the right lung mass causing SVC compression.  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  #4 Non compliancewith clinic and treatment followup. Missed 2nd dose of bendamustine with C2. Has missed multiple appointment for her PET/CT and missed appointment at [facility] for  consideration of Transplant.  Pt was lost to follow up after July 2018 and returned on 08/31/18 Discussed the patient's goals of care and the pt noted that she is ready to begin treatment again and maintain compliance and follow ups    #5 h/o Grade 1 neuropathyfrom Brentuximab-Vedotin - resolved  #6 Shingles outbreak - curently resolved with still with post herpetic neuralgia.  PLAN: - Pt had no prohibitive toxicities symptoms from C1 ICEand received adequate IVF and also received mesna to avoid bladder issues from high dose ifosfamide. -Discussed pt labwork from 11/07/18; blood counts improved with HGB to 10.6 and no thrombocytopenia  -Will order labs today  -patient had outpatient appointment at [facility] on 10/23/2018 for Auto HSCT consideration   -Will repeat pulmonary function test after C2 of ICE -Will increase 300mg  Gabapentin to BID and continue Amitryptiline for her PHN -Continue Acyclovir po BID for shingles prophylaxis -Will admit pt for C2D1 tomorrow 11/14/18 -Will repeat PET/CT and PFT in 3 weeks -Neulasta as outpatient on 11/17/18 -Recommend pt set up next appointment with Dr. Jolayne Haines for after repeat PFT and PET/CT are completed -will need close lab monitoring for assumed cytopenias that might need rpt transfusions.  #7 DVT Prophylaxis - lovenox   Labs today Admit tomorrow for C2 of ICE for 3-4 days Neulasta as outpatient on Friday 11/17/2018 RTC with Dr Irene Limbo in 9 days with labs PET/CT in 3 weeks Pulmonary function testing in 3 weeks   The total time spent in the appt was 25 minutes and more than 50% was on counseling and direct patient cares.    Sullivan Lone MD [place] AAHIVMS [facility] Hematology/Oncology Physician [place]  (Office):       [phone] (Work cell):  [phone] (Fax):           [phone]  I, Baldwin [place], am acting as a scribe for Dr. Sullivan Lone,   .I have reviewed the above documentation for accuracy and  completeness, and I agree with the above. Brunetta Genera MD

## 2018-11-13 ENCOUNTER — Telehealth: Payer: Self-pay | Admitting: Hematology

## 2018-11-13 ENCOUNTER — Telehealth: Payer: Self-pay | Admitting: *Deleted

## 2018-11-13 ENCOUNTER — Inpatient Hospital Stay: Payer: Medicaid Other

## 2018-11-13 ENCOUNTER — Inpatient Hospital Stay (HOSPITAL_BASED_OUTPATIENT_CLINIC_OR_DEPARTMENT_OTHER): Payer: Medicaid Other | Admitting: Hematology

## 2018-11-13 ENCOUNTER — Encounter: Payer: Self-pay | Admitting: Hematology

## 2018-11-13 VITALS — BP 151/90 | HR 102 | Temp 98.1°F | Resp 18 | Ht 67.0 in | Wt 128.7 lb

## 2018-11-13 DIAGNOSIS — D6481 Anemia due to antineoplastic chemotherapy: Secondary | ICD-10-CM

## 2018-11-13 DIAGNOSIS — D696 Thrombocytopenia, unspecified: Secondary | ICD-10-CM

## 2018-11-13 DIAGNOSIS — Z9114 Patient's other noncompliance with medication regimen: Secondary | ICD-10-CM

## 2018-11-13 DIAGNOSIS — R188 Other ascites: Secondary | ICD-10-CM | POA: Diagnosis not present

## 2018-11-13 DIAGNOSIS — C8128 Mixed cellularity classical Hodgkin lymphoma, lymph nodes of multiple sites: Secondary | ICD-10-CM

## 2018-11-13 DIAGNOSIS — Z79899 Other long term (current) drug therapy: Secondary | ICD-10-CM | POA: Diagnosis not present

## 2018-11-13 DIAGNOSIS — Z923 Personal history of irradiation: Secondary | ICD-10-CM

## 2018-11-13 DIAGNOSIS — T451X5A Adverse effect of antineoplastic and immunosuppressive drugs, initial encounter: Secondary | ICD-10-CM

## 2018-11-13 DIAGNOSIS — Z9119 Patient's noncompliance with other medical treatment and regimen: Secondary | ICD-10-CM

## 2018-11-13 DIAGNOSIS — Z87891 Personal history of nicotine dependence: Secondary | ICD-10-CM

## 2018-11-13 LAB — CBC WITH DIFFERENTIAL/PLATELET
Abs Immature Granulocytes: 0.03 10*3/uL (ref 0.00–0.07)
BASOS PCT: 0 %
Basophils Absolute: 0 10*3/uL (ref 0.0–0.1)
EOS ABS: 0 10*3/uL (ref 0.0–0.5)
Eosinophils Relative: 1 %
HCT: 37.8 % (ref 36.0–46.0)
Hemoglobin: 12.1 g/dL (ref 12.0–15.0)
Immature Granulocytes: 1 %
Lymphocytes Relative: 2 %
Lymphs Abs: 0.1 10*3/uL — ABNORMAL LOW (ref 0.7–4.0)
MCH: 30.3 pg (ref 26.0–34.0)
MCHC: 32 g/dL (ref 30.0–36.0)
MCV: 94.7 fL (ref 80.0–100.0)
MONOS PCT: 16 %
Monocytes Absolute: 1.1 10*3/uL — ABNORMAL HIGH (ref 0.1–1.0)
NEUTROS PCT: 80 %
NRBC: 0 % (ref 0.0–0.2)
Neutro Abs: 5.4 10*3/uL (ref 1.7–7.7)
PLATELETS: 410 10*3/uL — AB (ref 150–400)
RBC: 3.99 MIL/uL (ref 3.87–5.11)
RDW: 18.1 % — AB (ref 11.5–15.5)
WBC: 6.7 10*3/uL (ref 4.0–10.5)

## 2018-11-13 LAB — CMP (CANCER CENTER ONLY)
ALK PHOS: 129 U/L — AB (ref 38–126)
ALT: 9 U/L (ref 0–44)
AST: 16 U/L (ref 15–41)
Albumin: 3.7 g/dL (ref 3.5–5.0)
Anion gap: 8 (ref 5–15)
BILIRUBIN TOTAL: 0.4 mg/dL (ref 0.3–1.2)
BUN: 8 mg/dL (ref 6–20)
CALCIUM: 9.6 mg/dL (ref 8.9–10.3)
CO2: 27 mmol/L (ref 22–32)
Chloride: 104 mmol/L (ref 98–111)
Creatinine: 0.73 mg/dL (ref 0.44–1.00)
GLUCOSE: 70 mg/dL (ref 70–99)
Potassium: 4.9 mmol/L (ref 3.5–5.1)
Sodium: 139 mmol/L (ref 135–145)
TOTAL PROTEIN: 7.6 g/dL (ref 6.5–8.1)

## 2018-11-13 LAB — SEDIMENTATION RATE: Sed Rate: 18 mm/hr (ref 0–22)

## 2018-11-13 NOTE — Telephone Encounter (Signed)
Gave pt avs and calendar  °

## 2018-11-13 NOTE — Patient Instructions (Signed)
Thank you for choosing [facility] to provide your oncology and hematology care.  To afford each patient quality time with our providers, please arrive 30 minutes before your scheduled appointment time.  If you arrive late for your appointment, you may be asked to reschedule.  We strive to give you quality time with our providers, and arriving late affects you and other patients whose appointments are after yours.    If you are a no show for multiple scheduled visits, you may be dismissed from the clinic at the providers discretion.     Again, thank you for choosing [facility], our hope is that these requests will decrease the amount of time that you wait before being seen by our physicians.  ______________________________________________________________________   Should you have questions after your visit to the [facility], please contact our office at [phone] between the hours of 8:30 and 4:30 p.m.    Voicemails left after 4:30p.m will not be returned until the following business day.     For prescription refill requests, please have your pharmacy contact us directly.  Please also try to allow 48 hours for prescription requests.     Please contact the scheduling department for questions regarding scheduling.  For scheduling of procedures such as PET scans, CT scans, MRI, Ultrasound, etc please contact central scheduling at [phone].     Resources For Cancer Patients and Caregivers:    Oncolink.org:  A wonderful resource for patients and healthcare providers for information regarding your disease, ways to tract your treatment, what to expect, etc.      American Cancer Society:  800-227-2345  Can help patients locate various types of support and financial assistance   Cancer Care: 1-800-813-HOPE (4673) Provides financial assistance, online support groups, medication/co-pay assistance.     [place] DSS:  336-641-3447 Where to apply  for food stamps, Medicaid, and utility assistance   Medicare Rights Center: 800-333-4114 Helps people with Medicare understand their rights and benefits, navigate the Medicare system, and secure the quality healthcare they deserve   SCAT: 336-333-6589 [place] [employer]'s shared-ride transportation service for eligible riders who have a disability that prevents them from riding the fixed route bus.     For additional information on assistance programs please contact our social worker:   Abigail Elmore:  [phone]  

## 2018-11-13 NOTE — Telephone Encounter (Signed)
Contacted Dawn in Bed placement. To be admitted to [facility] for 3 days for ICE on 11/19. Bed placement will contact patient when bed available.

## 2018-11-14 ENCOUNTER — Telehealth: Payer: Self-pay

## 2018-11-14 ENCOUNTER — Encounter (HOSPITAL_COMMUNITY): Payer: Self-pay

## 2018-11-14 ENCOUNTER — Other Ambulatory Visit: Payer: Self-pay

## 2018-11-14 ENCOUNTER — Inpatient Hospital Stay (HOSPITAL_COMMUNITY)
Admission: RE | Admit: 2018-11-14 | Discharge: 2018-11-16 | DRG: 840 | Disposition: A | Discharge status: Home / Self Care | Payer: Medicaid Other | Source: Ambulatory Visit | Attending: Hematology | Admitting: Hematology

## 2018-11-14 DIAGNOSIS — C8120 Mixed cellularity classical Hodgkin lymphoma, unspecified site: Principal | ICD-10-CM | POA: Diagnosis present

## 2018-11-14 DIAGNOSIS — I10 Essential (primary) hypertension: Secondary | ICD-10-CM | POA: Diagnosis present

## 2018-11-14 DIAGNOSIS — J9691 Respiratory failure, unspecified with hypoxia: Secondary | ICD-10-CM | POA: Diagnosis present

## 2018-11-14 DIAGNOSIS — Z79891 Long term (current) use of opiate analgesic: Secondary | ICD-10-CM

## 2018-11-14 DIAGNOSIS — I871 Compression of vein: Secondary | ICD-10-CM | POA: Diagnosis present

## 2018-11-14 DIAGNOSIS — Z923 Personal history of irradiation: Secondary | ICD-10-CM

## 2018-11-14 DIAGNOSIS — Z87891 Personal history of nicotine dependence: Secondary | ICD-10-CM | POA: Diagnosis not present

## 2018-11-14 DIAGNOSIS — D638 Anemia in other chronic diseases classified elsewhere: Secondary | ICD-10-CM | POA: Diagnosis present

## 2018-11-14 DIAGNOSIS — M7989 Other specified soft tissue disorders: Secondary | ICD-10-CM | POA: Diagnosis present

## 2018-11-14 DIAGNOSIS — Z9119 Patient's noncompliance with other medical treatment and regimen: Secondary | ICD-10-CM | POA: Diagnosis not present

## 2018-11-14 DIAGNOSIS — Z79899 Other long term (current) drug therapy: Secondary | ICD-10-CM

## 2018-11-14 DIAGNOSIS — B0229 Other postherpetic nervous system involvement: Secondary | ICD-10-CM | POA: Diagnosis present

## 2018-11-14 DIAGNOSIS — R188 Other ascites: Secondary | ICD-10-CM | POA: Diagnosis present

## 2018-11-14 DIAGNOSIS — R634 Abnormal weight loss: Secondary | ICD-10-CM | POA: Diagnosis present

## 2018-11-14 DIAGNOSIS — Z5111 Encounter for antineoplastic chemotherapy: Secondary | ICD-10-CM

## 2018-11-14 DIAGNOSIS — R61 Generalized hyperhidrosis: Secondary | ICD-10-CM | POA: Diagnosis present

## 2018-11-14 DIAGNOSIS — R918 Other nonspecific abnormal finding of lung field: Secondary | ICD-10-CM | POA: Diagnosis present

## 2018-11-14 DIAGNOSIS — C8128 Mixed cellularity classical Hodgkin lymphoma, lymph nodes of multiple sites: Secondary | ICD-10-CM

## 2018-11-14 DIAGNOSIS — Z7189 Other specified counseling: Secondary | ICD-10-CM

## 2018-11-14 DIAGNOSIS — C8190 Hodgkin lymphoma, unspecified, unspecified site: Secondary | ICD-10-CM | POA: Diagnosis present

## 2018-11-14 DIAGNOSIS — G629 Polyneuropathy, unspecified: Secondary | ICD-10-CM | POA: Diagnosis present

## 2018-11-14 LAB — COMPREHENSIVE METABOLIC PANEL
ALT: 11 U/L (ref 0–44)
AST: 15 U/L (ref 15–41)
Albumin: 3.7 g/dL (ref 3.5–5.0)
Alkaline Phosphatase: 98 U/L (ref 38–126)
Anion gap: 7 (ref 5–15)
BUN: 9 mg/dL (ref 6–20)
CO2: 28 mmol/L (ref 22–32)
Calcium: 8.9 mg/dL (ref 8.9–10.3)
Chloride: 104 mmol/L (ref 98–111)
Creatinine, Ser: 0.6 mg/dL (ref 0.44–1.00)
GFR calc Af Amer: >60 mL/min (ref >60)
GFR calc non Af Amer: >60 mL/min (ref >60)
Glucose, Bld: 71 mg/dL (ref 70–99)
Potassium: 4 mmol/L (ref 3.5–5.1)
Sodium: 139 mmol/L (ref 135–145)
Total Bilirubin: 0.5 mg/dL (ref 0.3–1.2)
Total Protein: 7.1 g/dL (ref 6.5–8.1)

## 2018-11-14 LAB — CBC WITH DIFFERENTIAL/PLATELET
ABS IMMATURE GRANULOCYTES: 0.02 10*3/uL (ref 0.00–0.07)
BASOS ABS: 0 10*3/uL (ref 0.0–0.1)
BASOS PCT: 0 %
Eosinophils Absolute: 0 10*3/uL (ref 0.0–0.5)
Eosinophils Relative: 1 %
HCT: 35.2 % — ABNORMAL LOW (ref 36.0–46.0)
Hemoglobin: 11 g/dL — ABNORMAL LOW (ref 12.0–15.0)
Immature Granulocytes: 0 %
Lymphocytes Relative: 3 %
Lymphs Abs: 0.2 10*3/uL — ABNORMAL LOW (ref 0.7–4.0)
MCH: 29.8 pg (ref 26.0–34.0)
MCHC: 31.3 g/dL (ref 30.0–36.0)
MCV: 95.4 fL (ref 80.0–100.0)
Monocytes Absolute: 1.1 10*3/uL — ABNORMAL HIGH (ref 0.1–1.0)
Monocytes Relative: 19 %
NEUTROS ABS: 4.6 10*3/uL (ref 1.7–7.7)
NEUTROS PCT: 77 %
PLATELETS: 368 10*3/uL (ref 150–400)
RBC: 3.69 MIL/uL — AB (ref 3.87–5.11)
RDW: 18 % — ABNORMAL HIGH (ref 11.5–15.5)
WBC: 5.9 10*3/uL (ref 4.0–10.5)
nRBC: 0 % (ref 0.0–0.2)

## 2018-11-14 MED ORDER — SODIUM CHLORIDE 0.9 % IV SOLN
100.0000 mg/m2 | Freq: Once | INTRAVENOUS | Status: AC
  Administered 2018-11-14: 170 mg via INTRAVENOUS
  Filled 2018-11-14: qty 8.5

## 2018-11-14 MED ORDER — AMITRIPTYLINE HCL 10 MG PO TABS: 10.0000 mg | ORAL_TABLET | Freq: Every day | ORAL | Status: DC

## 2018-11-14 MED ORDER — OXYCODONE-ACETAMINOPHEN 5-325 MG PO TABS
1.0000 | ORAL_TABLET | Freq: Four times a day (QID) | ORAL | Status: DC | PRN
  Administered 2018-11-14 – 2018-11-16 (×5): 2 via ORAL
  Filled 2018-11-14 (×5): qty 2

## 2018-11-14 MED ORDER — FAMOTIDINE 20 MG PO TABS: 20.0000 mg | ORAL_TABLET | Freq: Once | ORAL | Status: DC

## 2018-11-14 MED ORDER — LIDOCAINE-PRILOCAINE 2.5-2.5 % EX CREA: 1.0000 "application " | TOPICAL_CREAM | CUTANEOUS | Status: DC | PRN

## 2018-11-14 MED ORDER — SODIUM CHLORIDE 0.9 % IV SOLN
Freq: Once | INTRAVENOUS | Status: AC
  Administered 2018-11-14: 16 mg via INTRAVENOUS
  Filled 2018-11-14: qty 8

## 2018-11-14 MED ORDER — LORAZEPAM 0.5 MG PO TABS: 0.5000 mg | ORAL_TABLET | Freq: Four times a day (QID) | ORAL | Status: DC | PRN

## 2018-11-14 MED ORDER — GABAPENTIN 300 MG PO CAPS
300.0000 mg | ORAL_CAPSULE | Freq: Two times a day (BID) | ORAL | Status: DC
  Administered 2018-11-14 – 2018-11-16 (×4): 300 mg via ORAL
  Filled 2018-11-14 (×4): qty 1

## 2018-11-14 MED ORDER — DIPHENHYDRAMINE HCL 50 MG/ML IJ SOLN
25.0000 mg | Freq: Four times a day (QID) | INTRAMUSCULAR | Status: DC | PRN
  Administered 2018-11-15 – 2018-11-16 (×4): 25 mg via INTRAVENOUS
  Filled 2018-11-14 (×4): qty 1

## 2018-11-14 MED ORDER — SODIUM CHLORIDE 0.9 % IV SOLN
INTRAVENOUS | Status: DC
  Administered 2018-11-14: 16:00:00 via INTRAVENOUS

## 2018-11-14 MED ORDER — ENOXAPARIN SODIUM 40 MG/0.4ML ~~LOC~~ SOLN
40.0000 mg | SUBCUTANEOUS | Status: DC
  Filled 2018-11-14: qty 0.4

## 2018-11-14 MED ORDER — ACYCLOVIR 400 MG PO TABS
400.0000 mg | ORAL_TABLET | Freq: Two times a day (BID) | ORAL | Status: DC
  Administered 2018-11-14 – 2018-11-16 (×4): 400 mg via ORAL
  Filled 2018-11-14 (×4): qty 1

## 2018-11-14 NOTE — Progress Notes (Signed)
Chemo calculations and rates veified with Production designer, theatre/television/film

## 2018-11-14 NOTE — H&P (Signed)
HEMATOLOGY/ONCOLOGY H&P NOTE  Date of Service: 11/14/2018  Patient Care Team: Clinic, General Medical as PCP - General (Family Medicine)  CHIEF COMPLAINTS/PURPOSE OF CONSULTATION:  C2 ICE for Relapsed Hodgkin's Lymphoma  HISTORY OF PRESENTING ILLNESS:    Heidi [name] is a 32 y.o. female who has been admitted today for C2 ICE treatment of her Relapsed Hodgkin's Lymphoma in 3 rd line setting. The pt reports that she is doing well overall.   The pt reports that she has continued to experience pain related to her post-herpetic neuralgias. She has continued moving her bowels well and denies any abdominal pains.   Lab results today (11/14/18) of CBC w/diff and CMP is as follows: all values are WNL except for RBC at 3.69, HGB at 11.0, HCT at 35.2, RDW at 18.0, Lymphs abs at 200, Monocytes abs at 1.1k.  On review of systems, pt reports post herpetic neuralgia pain, moving her bowels well, good energy levels, and denies nausea, vomiting, diarrhea, problems breathing, abdominal pains, leg swelling, and any other symptoms.   Breathing much improved after 1st cycle of ICE. She did have significant anemia and thrombocytopenia and needed transfusion support after her 1st cycle of ICE and will need to be monitored closely.  We did discuss need for close monitoring and compliance with followup.  MEDICAL HISTORY:  Past Medical History:  Diagnosis Date  . ARDS (adult respiratory distress syndrome) ([place])   . Asthma   . Hodgkin lymphoma ([place])   . Hypertension     SURGICAL HISTORY: Past Surgical History:  Procedure Laterality Date  . AXILLARY LYMPH NODE BIOPSY Right 03/19/2016   Procedure: AXILLARY LYMPH NODE BIOPSY;  Surgeon: Armandina Gemma, MD;  Location: [facility];  Service: General;  Laterality: Right;  . IR GENERIC HISTORICAL  12/22/2016   IR FLUORO GUIDE PORT INSERTION LEFT 12/22/2016 [facility]  . IR GENERIC HISTORICAL  12/22/2016   IR US GUIDE VASC ACCESS LEFT 12/22/2016 [facility]  . IR GENERIC HISTORICAL  12/22/2016   IR CV LINE INJECTION 12/22/2016 [facility]  . IR GENERIC HISTORICAL  12/22/2016   IR REMOVAL TUN ACCESS W/ PORT W/O FL MOD SED 12/22/2016 [facility]  . VIDEO BRONCHOSCOPY Bilateral 11/26/2016   Procedure: VIDEO BRONCHOSCOPY WITH FLUORO;  Surgeon: Rigoberto Noel, MD;  Location[facility];  Service: Cardiopulmonary;  Laterality: Bilateral;    SOCIAL HISTORY: Social History   Socioeconomic History  . Marital status: Single    Spouse name: Not on file  . Number of children: Not on file  . Years of education: Not on file  . Highest education level: Not on file  Occupational History  . Not on file  Social Needs  . Financial resource strain: Not on file  . Food insecurity:    Worry: Not on file    Inability: Not on file  . Transportation needs:    Medical: Yes    Non-medical: Yes  Tobacco Use  . Smoking status: Former Smoker    Packs/day: 0.50    Years: 15.00    Pack years: 7.50    Types: Cigarettes    Last attempt to quit: 03/27/2016    Years since quitting: 2.6  . Smokeless tobacco: Never Used  Substance and Sexual Activity  . Alcohol use: Yes    Comment: occasional now  . Drug use: Not on file  . Sexual activity: Not on file  Lifestyle  . Physical activity:    Days per week: Not on  file    Minutes per session: Not on file  . Stress: Not on file  Relationships  . Social connections:    Talks on phone: Not on file    Gets together: Not on file    Attends religious service: Not on file    Active member of club or organization: Not on file    Attends meetings of clubs or organizations: Not on file    Relationship status: Not on file  . Intimate partner violence:    Fear of current or ex partner: Not on file    Emotionally abused: Not on file    Physically abused: Not on file    Forced sexual activity: Not on file  Other Topics Concern  . Not on file  Social History Narrative  . Not on file    FAMILY  HISTORY: History reviewed. No pertinent family history.  ALLERGIES:  has No Known Allergies.  MEDICATIONS:  Current Facility-Administered Medications  Medication Dose Route Frequency Provider Last Rate Last Dose  . 0.9 %  sodium chloride infusion   Intravenous Continuous Brunetta Genera, MD 20 mL/hr at 11/14/18 1542    . acyclovir (ZOVIRAX) tablet 400 mg  400 mg Oral BID Brunetta Genera, MD      . enoxaparin (LOVENOX) injection 40 mg  40 mg Subcutaneous Q24H Brunetta Genera, MD      . etoposide (VEPESID) 170 mg in sodium chloride 0.9 % 500 mL chemo infusion  100 mg/m2 (Treatment Plan Recorded) Intravenous Once Brunetta Genera, MD 509 mL/hr at 11/14/18 1718 170 mg at 11/14/18 1718  . gabapentin (NEURONTIN) capsule 300 mg  300 mg Oral BID Brunetta Genera, MD      . lidocaine-prilocaine (EMLA) cream 1 application  1 application Topical PRN Brunetta Genera, MD      . LORazepam (ATIVAN) tablet 0.5 mg  0.5 mg Oral Q6H PRN Brunetta Genera, MD      . oxyCODONE-acetaminophen (PERCOCET/ROXICET) 5-325 MG per tablet 1-2 tablet  1-2 tablet Oral Q6H PRN Brunetta Genera, MD        REVIEW OF SYSTEMS:    10 Point review of Systems was done is negative except as noted above.  PHYSICAL EXAMINATION: ECOG PERFORMANCE STATUS: 1 - Symptomatic but completely ambulatory  . Vitals:   11/14/18 1038  BP: (!) 130/94  Pulse: (!) 111  Resp: 18  Temp: 98.2 F (36.8 C)  SpO2: 94%   GENERAL:alert, in no acute distress and comfortable SKIN: no acute rashes, skin changes over chest wall from resolved shingles. EYES: conjunctiva are pink and non-injected, sclera anicteric OROPHARYNX: MMM, no exudates, no oropharyngeal erythema or ulceration NECK: supple, no JVD LYMPH:  no palpable lymphadenopathy in the cervical, axillary or inguinal regions LUNGS: b/l scattered rhonci no rales HEART: regular rate & rhythm ABDOMEN:  normoactive bowel sounds , non tender, not  distended. Extremity: no pedal edema PSYCH: alert & oriented x 3 with fluent speech NEURO: no focal motor/sensory deficits  LABORATORY DATA:  I have reviewed the data as listed  . CBC Latest Ref Rng & Units 11/14/2018 11/13/2018 11/07/2018  WBC 4.0 - 10.5 K/uL 5.9 6.7 5.4  Hemoglobin 12.0 - 15.0 g/dL 11.0(L) 12.1 10.6(L)  Hematocrit 36.0 - 46.0 % 35.2(L) 37.8 33.2(L)  Platelets 150 - 400 K/uL 368 410(H) 473(H)    . CMP Latest Ref Rng & Units 11/14/2018 11/13/2018 11/07/2018  Glucose 70 - 99 mg/dL 71 70 82  BUN 6 - 20  mg/dL 9 8 6   Creatinine 0.44 - 1.00 mg/dL 0.60 0.73 0.66  Sodium 135 - 145 mmol/L 139 139 140  Potassium 3.5 - 5.1 mmol/L 4.0 4.9 3.9  Chloride 98 - 111 mmol/L 104 104 107  CO2 22 - 32 mmol/L 28 27 26   Calcium 8.9 - 10.3 mg/dL 8.9 9.6 9.3  Total Protein 6.5 - 8.1 g/dL 7.1 7.6 6.8  Total Bilirubin 0.3 - 1.2 mg/dL 0.5 0.4 0.4  Alkaline Phos 38 - 126 U/L 98 129(H) 122  AST 15 - 41 U/L 15 16 12(L)  ALT 0 - 44 U/L 11 9 10     RADIOGRAPHIC STUDIES: I have personally reviewed the radiological images as listed and agreed with the findings in the report. Dg Chest 2 View  Result Date: 10/20/2018 CLINICAL DATA:  Chest pain EXAM: CHEST - 2 VIEW COMPARISON:  PET CT 09/26/2018 FINDINGS: There is an accessed left chest wall Port-A-Cath the follows a left internal jugular vein approach to terminate in the lower SVC. Cardiomediastinal contours are normal. There is large area of consolidation in the right mid lung with mild airspace opacity in the left mid lung. No pleural effusion or pneumothorax. IMPRESSION: Moderate right and mild left mid lung airspace opacities, which may be secondary to the patient's lymphoma, though superimposed infectious pneumonia could also have this appearance. Electronically Signed   By: Ulyses Jarred M.D.   On: 10/20/2018 20:00    ASSESSMENT & PLAN:   32 y.o. 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 She was noted to have significant constitutional symptoms including significant weight loss, fevers chills and some night sweats. HIV negative Hepatitis C and hepatitis B serologies negative. Echo with normal ejection fraction. Patient was treated with 5 cycles of AVD (Bleomycin held due to poor DLCO 37% and ongoing smoking). Multiple avoidable treatment delays due to the patient's noncompliance with follow-up for avoidable reasons. She has been counseled repeatedly that this would increase the likelihood of unfavorable outcome.  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 S/p 6 cycles of 2nd line treatment with Bendamustine/Brentuximab And 1 cycle of Bretuximab alone  -PET/CT scan results from 04/14/2017 were discussed in details. She appears to have some persistent disease in her right lung at Deauville 5. It is difficult to say if this is recurrent or persistent disease since the patient failed to follow-up on multiple scheduled PET/CT scans in the early part and prior to her second line treatment.  Patient was lost to followup for >1 yr  09/26/18 PET/CT revealed Imaging findings compatible with recurrence of disease. 2. Multiple new large areas of hypermetabolic nodularity and airspace consolidation within both lungs which is presumed to represent pulmonary involvement by lymphoma. Deauville criteria 5. 3. New hypermetabolic left supraclavicular, left retroperitoneal, and bilateral pelvic lymph nodes. Deauville criteria 5. 4. Multifocal hypermetabolic osseous lesions. Deauville criteria 5. 5. Small volume of ascites, new.   #2 s/p hypoxic respiratory failurewith dense right lung consolidation and left upper lobe consolidation with SVC syndrome. Patient has completed palliative radiation to the right lung mass causing SVC  compression.  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  #4 Non compliancewith clinic and treatment followup. Missed 2nd dose of bendamustine with C2. Has missed multiple appointment for her PET/CT and missed appointment at [facility] for consideration of Transplant.  Pt was lost to follow up after July 2018 and returned on 08/31/18 Discussed the patient's goals of care and the pt noted that she is ready to begin treatment again and maintain compliance and follow ups   #5 h/o Grade 1 neuropathyfrom Brentuximab-Vedotin - resolved  #6 Shingles outbreak - curently resolved but with significant post herpetic neuralgia  #7 Significant anemia and thrombocytopenia after C1 of ICE -- will need close monitoring.  PLAN: - Pt has no current limiting prohibitive toxicities from C1 ICEand received adequate IVF and also received mesna to avoid bladder issues from high dose ifosfamide. Discussed pt labwork today, 11/14/18; HGB slightly lower at 11.0, blood counts and chemistries are otherwise stable  -The pt has no prohibitive toxicities from continuing C2D1 ICE at this time.   -chemotherapy orders placed and reviewed with pharmacist. -Continue eating well, staying hydrated, and staying as active as reasonably possible  -will need close monitoring of her labs as outpatient and likely rpt transfusion support. -patient had outpatient appointment at [facility] on 10/28/2019for Auto HSCT consideration  - Dr Jolayne Haines would like to evaluate treatment response and lung function prior to consideration for transplant candidacy -Will repeat pulmonary function test after C2 of ICE -Will increase 300mg  Gabapentin to BID and continue Amitryptiline for control of her Post herpetic neuralgia. Prn percocet if pain uncontrolled. -Continue Acyclovir every day   -Will repeat PET/CT and PFT in 3 weeks -Neulasta as outpatient on 11/17/18 -Recommend pt set up  next appointment with Dr. Jolayne Haines for after repeat PFT and PET/CT are completed  #7 DVT Prophylaxis - lovenox  All of the patients questions were answered with apparent satisfaction. The patient knows to call the clinic with any problems, questions or concerns.    Sullivan Lone MD MS AAHIVMS [facility] Hematology/Oncology Physician [facility]  (Office):       [phone] (Work cell):  [phone] (Fax):           [phone]  11/14/2018 5:31 PM  I, Baldwin [place], am acting as a scribe for Dr. Sullivan Lone.   .I have reviewed the above documentation for accuracy and completeness, and I agree with the above. Sullivan Lone MD MS

## 2018-11-14 NOTE — Telephone Encounter (Signed)
Per 11/18 los scheduled by Ubaldo Glassing B.

## 2018-11-15 ENCOUNTER — Other Ambulatory Visit: Payer: Medicaid Other

## 2018-11-15 ENCOUNTER — Ambulatory Visit: Payer: Medicaid Other | Admitting: Hematology

## 2018-11-15 DIAGNOSIS — B0229 Other postherpetic nervous system involvement: Secondary | ICD-10-CM

## 2018-11-15 LAB — CBC
HCT: 35.5 % — ABNORMAL LOW (ref 36.0–46.0)
Hemoglobin: 11.1 g/dL — ABNORMAL LOW (ref 12.0–15.0)
MCH: 30.7 pg (ref 26.0–34.0)
MCHC: 31.3 g/dL (ref 30.0–36.0)
MCV: 98.3 fL (ref 80.0–100.0)
NRBC: 0 % (ref 0.0–0.2)
PLATELETS: 363 10*3/uL (ref 150–400)
RBC: 3.61 MIL/uL — ABNORMAL LOW (ref 3.87–5.11)
RDW: 17.6 % — AB (ref 11.5–15.5)
WBC: 7.6 10*3/uL (ref 4.0–10.5)

## 2018-11-15 LAB — COMPREHENSIVE METABOLIC PANEL
ALK PHOS: 100 U/L (ref 38–126)
ALT: 12 U/L (ref 0–44)
AST: 17 U/L (ref 15–41)
Albumin: 3.8 g/dL (ref 3.5–5.0)
Anion gap: 7 (ref 5–15)
BUN: 11 mg/dL (ref 6–20)
CALCIUM: 9.4 mg/dL (ref 8.9–10.3)
CO2: 27 mmol/L (ref 22–32)
CREATININE: 0.62 mg/dL (ref 0.44–1.00)
Chloride: 102 mmol/L (ref 98–111)
GFR calc Af Amer: >60 mL/min (ref >60)
Glucose, Bld: 144 mg/dL — ABNORMAL HIGH (ref 70–99)
Potassium: 5.1 mmol/L (ref 3.5–5.1)
Sodium: 136 mmol/L (ref 135–145)
Total Bilirubin: 0.8 mg/dL (ref 0.3–1.2)
Total Protein: 7.5 g/dL (ref 6.5–8.1)

## 2018-11-15 MED ORDER — SODIUM CHLORIDE 0.9 % IV SOLN
Freq: Once | INTRAVENOUS | Status: AC
  Administered 2018-11-15: 18:00:00 via INTRAVENOUS
  Filled 2018-11-15: qty 169

## 2018-11-15 MED ORDER — SODIUM CHLORIDE 0.9 % IV SOLN
Freq: Once | INTRAVENOUS | Status: AC
  Administered 2018-11-15: 10 mg via INTRAVENOUS
  Filled 2018-11-15: qty 8

## 2018-11-15 MED ORDER — SODIUM CHLORIDE 0.9 % IV SOLN
608.5000 mg | Freq: Once | INTRAVENOUS | Status: AC
  Administered 2018-11-15: 610 mg via INTRAVENOUS
  Filled 2018-11-15: qty 61

## 2018-11-15 MED ORDER — SODIUM CHLORIDE 0.9 % IV SOLN
100.0000 mg/m2 | Freq: Once | INTRAVENOUS | Status: AC
  Administered 2018-11-15: 170 mg via INTRAVENOUS
  Filled 2018-11-15: qty 8.5

## 2018-11-15 NOTE — Progress Notes (Signed)
Ifosfamide and mesna calculations verified with Aldean Baker BSN

## 2018-11-15 NOTE — Progress Notes (Signed)
HEMATOLOGY/ONCOLOGY INPATIENT PROGRESS NOTE  Date of Service: 11/15/2018  Inpatient Attending: .Brunetta Genera, MD   SUBJECTIVE:   Heidi [name] reports that she is doing well overall and has not developed any new concerns today.   The pt reports that her post-herpetic neuralgia pain is much better controlled at this time.   She denies any difficulty urinating, headaches or difficulty breathing. The pt has continued eating well and has moved her bowels well. She denies any leg swelling and SOB.   Lab results today (11/16/18) of CBC and CMP is as follows: all values are WNL except for RBC at 3.61, HGB at 11.1, HCT at 35.5, RDW at 17.6, Glucose at 144.  On review of systems, pt reports good energy levels, well controlled post-herpetic neuralgia pain, moving her bowels well, eating well, and denies difficulty breathing, difficulty passing urine, headaches, abdominal pains, leg swelling, and any other symptoms.   OBJECTIVE:  NAD  PHYSICAL EXAMINATION: . Vitals:   11/14/18 1038 11/14/18 2221 11/15/18 0621 11/15/18 1424  BP: (!) 130/94 (!) 141/96 (!) 149/98 137/86  Pulse: (!) 111 99 76 88  Resp: 18 16 20 18   Temp: 98.2 F (36.8 C) 98.3 F (36.8 C) 97.9 F (36.6 C) 97.8 F (36.6 C)  TempSrc: Oral Oral Oral Oral  SpO2: 94% 91% 94% (!) 87%   GENERAL:alert, in no acute distress and comfortable SKIN: no acute rashes, skin changes over chest wall from resolved shingles  EYES: conjunctiva are pink and non-injected, sclera anicteric OROPHARYNX: MMM, no exudates, no oropharyngeal erythema or ulceration NECK: supple, no JVD LYMPH:  no palpable lymphadenopathy in the cervical, axillary or inguinal regions LUNGS: b/l scattered rhonchi, no rales HEART: regular rate & rhythm ABDOMEN:  normoactive bowel sounds , non tender, not distended. No palpable hepatosplenomegaly.  Extremity: no pedal edema PSYCH: alert & oriented x 3 with fluent speech NEURO: no focal motor/sensory  deficits   MEDICAL HISTORY:  Past Medical History:  Diagnosis Date  . ARDS (adult respiratory distress syndrome) ([place])   . Asthma   . Hodgkin lymphoma ([place])   . Hypertension     SURGICAL HISTORY: Past Surgical History:  Procedure Laterality Date  . AXILLARY LYMPH NODE BIOPSY Right 03/19/2016   Procedure: AXILLARY LYMPH NODE BIOPSY;  Surgeon: Armandina Gemma, MD;  [facility];  Service: General;  Laterality: Right;  . IR GENERIC HISTORICAL  12/22/2016   IR FLUORO GUIDE PORT INSERTION LEFT 12/22/2016 [facility]  . IR GENERIC HISTORICAL  12/22/2016   IR US GUIDE VASC ACCESS LEFT 12/22/2016 [facility]  . IR GENERIC HISTORICAL  12/22/2016   IR CV LINE INJECTION 12/22/2016 [facility]  . IR GENERIC HISTORICAL  12/22/2016   IR REMOVAL TUN ACCESS W/ PORT W/O FL MOD SED 12/22/2016 [facility]  . VIDEO BRONCHOSCOPY Bilateral 11/26/2016   Procedure: VIDEO BRONCHOSCOPY WITH FLUORO;  Surgeon: Rigoberto Noel, MD;  [facility];  Service: Cardiopulmonary;  Laterality: Bilateral;    SOCIAL HISTORY: Social History   Socioeconomic History  . Marital status: Single    Spouse name: Not on file  . Number of children: Not on file  . Years of education: Not on file  . Highest education level: Not on file  Occupational History  . Not on file  Social Needs  . Financial resource strain: Not on file  . Food insecurity:    Worry: Not on file    Inability: Not on file  . Transportation needs:  Medical: Yes    Non-medical: Yes  Tobacco Use  . Smoking status: Former Smoker    Packs/day: 0.50    Years: 15.00    Pack years: 7.50    Types: Cigarettes    Last attempt to quit: 03/27/2016    Years since quitting: 2.6  . Smokeless tobacco: Never Used  Substance and Sexual Activity  . Alcohol use: Yes    Comment: occasional now  . Drug use: Not on file  . Sexual activity: Not on file  Lifestyle  . Physical activity:    Days per week: Not on file    Minutes per session: Not  on file  . Stress: Not on file  Relationships  . Social connections:    Talks on phone: Not on file    Gets together: Not on file    Attends religious service: Not on file    Active member of club or organization: Not on file    Attends meetings of clubs or organizations: Not on file    Relationship status: Not on file  . Intimate partner violence:    Fear of current or ex partner: Not on file    Emotionally abused: Not on file    Physically abused: Not on file    Forced sexual activity: Not on file  Other Topics Concern  . Not on file  Social History Narrative  . Not on file    FAMILY HISTORY: History reviewed. No pertinent family history.  ALLERGIES:  has No Known Allergies.  MEDICATIONS:  Scheduled Meds: . acyclovir  400 mg Oral BID  . enoxaparin (LOVENOX) injection  40 mg Subcutaneous Q24H  . famotidine  20 mg Oral Once  . gabapentin  300 mg Oral BID  . ifosfamide/mesna (IFEX/MESNEX) CHEMO for Inpatient CI in 1000 ml   Intravenous Once   Continuous Infusions: . sodium chloride 20 mL/hr at 11/14/18 1542   PRN Meds:.diphenhydrAMINE, lidocaine-prilocaine, LORazepam, oxyCODONE-acetaminophen  REVIEW OF SYSTEMS:    A 10+ POINT REVIEW OF SYSTEMS WAS OBTAINED including neurology, dermatology, psychiatry, cardiac, respiratory, lymph, extremities, GI, GU, Musculoskeletal, constitutional, breasts, reproductive, HEENT.  All pertinent positives are noted in the HPI.  All others are negative.    LABORATORY DATA:  I have reviewed the data as listed  . CBC Latest Ref Rng & Units 11/15/2018 11/14/2018 11/13/2018  WBC 4.0 - 10.5 K/uL 7.6 5.9 6.7  Hemoglobin 12.0 - 15.0 g/dL 11.1(L) 11.0(L) 12.1  Hematocrit 36.0 - 46.0 % 35.5(L) 35.2(L) 37.8  Platelets 150 - 400 K/uL 363 368 410(H)    . CMP Latest Ref Rng & Units 11/15/2018 11/14/2018 11/13/2018  Glucose 70 - 99 mg/dL 144(H) 71 70  BUN 6 - 20 mg/dL 11 9 8   Creatinine 0.44 - 1.00 mg/dL 0.62 0.60 0.73  Sodium 135 - 145  mmol/L 136 139 139  Potassium 3.5 - 5.1 mmol/L 5.1 4.0 4.9  Chloride 98 - 111 mmol/L 102 104 104  CO2 22 - 32 mmol/L 27 28 27   Calcium 8.9 - 10.3 mg/dL 9.4 8.9 9.6  Total Protein 6.5 - 8.1 g/dL 7.5 7.1 7.6  Total Bilirubin 0.3 - 1.2 mg/dL 0.8 0.5 0.4  Alkaline Phos 38 - 126 U/L 100 98 129(H)  AST 15 - 41 U/L 17 15 16   ALT 0 - 44 U/L 12 11 9      RADIOGRAPHIC STUDIES: I have personally reviewed the radiological images as listed and agreed with the findings in the report.  ASSESSMENT & PLAN:  32 y.o. 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 She was noted to have significant constitutional symptoms including significant weight loss, fevers chills and some night sweats. HIV negative Hepatitis C and hepatitis B serologies negative. Echo with normal ejection fraction. Patient was treated with 5 cycles of AVD (Bleomycin held due to poor DLCO 37% and ongoing smoking). Multiple avoidable treatment delays due to the patient's noncompliance with follow-up for avoidable reasons. She has been counseled repeatedly that this would increase the likelihood of unfavorable outcome.  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 S/p 6 cycles of 2nd line treatment with Bendamustine/Brentuximab And 1 cycle of Bretuximab alone  -PET/CT scan results from 04/14/2017 were discussed in details. She appears to have some persistent disease in her right lung at Deauville 5. It is difficult to say if this is recurrent or persistent disease since the patient failed to follow-up on multiple scheduled PET/CT scans in the early part and prior to her second line treatment.  Patient was lost to followup for >1 yr  09/26/18 PET/CT revealed Imaging findings compatible with recurrence of disease. 2. Multiple new large  areas of hypermetabolic nodularity and airspace consolidation within both lungs which is presumed to represent pulmonary involvement by lymphoma. Deauville criteria 5. 3. New hypermetabolic left supraclavicular, left retroperitoneal, and bilateral pelvic lymph nodes. Deauville criteria 5. 4. Multifocal hypermetabolic osseous lesions. Deauville criteria 5. 5. Small volume of ascites, new.   #2 s/p hypoxic respiratory failurewith dense right lung consolidation and left upper lobe consolidation with SVC syndrome. Patient has completed palliative radiation to the right lung mass causing SVC compression.  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  #4 Non compliancewith clinic and treatment followup. Missed 2nd dose of bendamustine with C2. Has missed multiple appointment for her PET/CT and missed appointment at [facility] for [facility] of Transplant.  Pt was lost to follow up after July 2018 and returned on 08/31/18 Discussed the patient's goals of care and the pt noted that she is ready to begin treatment again and maintain compliance and follow ups   #5 h/o Grade 1 neuropathyfrom Brentuximab-Vedotin - resolved  #6 Shingles outbreak - curently resolved but with significant post herpetic neuralgia  #7 Significant anemia and thrombocytopenia after C1 of ICE -- will need close monitoring.  PLAN: -Discussed pt labwork today, 11/15/18; blood counts and chemistries are stable -The pt has no prohibitive toxicities from continuing C2D1 ICE  at this time.   -Continue eating well, staying hydrated, and staying as active as reasonably possible    -will need close monitoring of her labs as outpatient and likely rpt transfusion support. -continue Gabapentin , Amitryptiline and Prn percocet for PHN. -Continue Acyclovir for shingles prophylaxis.  -Will repeat PET/CT and PFT in 3 weeks -Neulasta as outpatient on 11/17/18 -Recommend pt set  up next appointment with Dr. Jolayne Haines for after repeat PFT and PET/CT are completed   #8 DVT Prophylaxis - lovenox   The total time spent in the appt was 25 minutes and more than 50% was on counseling and direct patient cares.    Sullivan Lone MD Nettie AAHIVMS [facility] Hematology/Oncology Physician [facility]  (Office):       [phone] (Work cell):  [phone] (Fax):           [phone]  11/15/2018 5:18  PM   I, Schuyler Bain, am acting as a scribe for Dr. Sullivan Lone.   .I have reviewed the above documentation for accuracy and completeness, and I agree with the above. Sullivan Lone MD MS

## 2018-11-16 LAB — CBC
HEMATOCRIT: 32.5 % — AB (ref 36.0–46.0)
Hemoglobin: 10.1 g/dL — ABNORMAL LOW (ref 12.0–15.0)
MCH: 31 pg (ref 26.0–34.0)
MCHC: 31.1 g/dL (ref 30.0–36.0)
MCV: 99.7 fL (ref 80.0–100.0)
PLATELETS: 285 10*3/uL (ref 150–400)
RBC: 3.26 MIL/uL — AB (ref 3.87–5.11)
RDW: 18.1 % — AB (ref 11.5–15.5)
WBC: 10.8 10*3/uL — ABNORMAL HIGH (ref 4.0–10.5)
nRBC: 0 % (ref 0.0–0.2)

## 2018-11-16 LAB — COMPREHENSIVE METABOLIC PANEL
ALT: 11 U/L (ref 0–44)
AST: 14 U/L — AB (ref 15–41)
Albumin: 3.3 g/dL — ABNORMAL LOW (ref 3.5–5.0)
Alkaline Phosphatase: 80 U/L (ref 38–126)
Anion gap: 7 (ref 5–15)
BUN: 14 mg/dL (ref 6–20)
CO2: 28 mmol/L (ref 22–32)
CREATININE: 0.62 mg/dL (ref 0.44–1.00)
Calcium: 8.6 mg/dL — ABNORMAL LOW (ref 8.9–10.3)
Chloride: 105 mmol/L (ref 98–111)
GFR calc Af Amer: >60 mL/min (ref >60)
GFR calc non Af Amer: >60 mL/min (ref >60)
GLUCOSE: 104 mg/dL — AB (ref 70–99)
Potassium: 4.3 mmol/L (ref 3.5–5.1)
SODIUM: 140 mmol/L (ref 135–145)
Total Bilirubin: 0.4 mg/dL (ref 0.3–1.2)
Total Protein: 6.5 g/dL (ref 6.5–8.1)

## 2018-11-16 MED ORDER — HEPARIN SOD (PORK) LOCK FLUSH 100 UNIT/ML IV SOLN
500.0000 [IU] | INTRAVENOUS | Status: DC | PRN
  Filled 2018-11-16: qty 5

## 2018-11-16 MED ORDER — SODIUM CHLORIDE 0.9 % IV SOLN
100.0000 mg/m2 | Freq: Once | INTRAVENOUS | Status: AC
  Administered 2018-11-16: 170 mg via INTRAVENOUS
  Filled 2018-11-16: qty 8.5

## 2018-11-16 MED ORDER — SODIUM CHLORIDE 0.9 % IV SOLN
Freq: Once | INTRAVENOUS | Status: AC
  Administered 2018-11-16: 36 mg via INTRAVENOUS
  Filled 2018-11-16: qty 8

## 2018-11-16 MED ORDER — SODIUM CHLORIDE 0.9 % IV SOLN
INTRAVENOUS | Status: AC
  Administered 2018-11-16: 18:00:00 via INTRAVENOUS

## 2018-11-16 NOTE — Progress Notes (Signed)
Pt discharged home in stable condition. Discharge instructions given. Pt verbalized understanding. No immediate questions or concerns at this time. Pt chose to ambulate off unit.  

## 2018-11-16 NOTE — Progress Notes (Signed)
Chemo calculations and dosages verified with Threasa Beards, RN

## 2018-11-16 NOTE — Discharge Summary (Signed)
[place]  [phone] [phone]    Physician Discharge Summary     Patient ID: [name] MRN: [id number] DOB/AGE: Jun 27, 1986 32 y.o.  Admit date: 11/14/2018 Discharge date: 11/16/2018  Primary Care Physician:  Clinic, General Medical   Discharge Diagnoses:    Present on Admission: . Hodgkin's lymphoma [facility])   Discharge Medications:  Allergies as of 11/16/2018   No Known Allergies     Medication List    STOP taking these medications   lidocaine 5 % ointment Commonly known as:  XYLOCAINE   lidocaine-prilocaine cream Commonly known as:  EMLA   potassium chloride SA 20 MEQ tablet Commonly known as:  K-DUR,KLOR-CON   valACYclovir 1000 MG tablet Commonly known as:  VALTREX     TAKE these medications   acyclovir 400 MG tablet Commonly known as:  ZOVIRAX Take 1 tablet (400 mg total) by mouth 2 (two) times daily. Start after completion of Valtrex   amitriptyline 10 MG tablet Commonly known as:  ELAVIL Take 1 tablet (10 mg total) by mouth at bedtime.   gabapentin 300 MG capsule Commonly known as:  NEURONTIN Take 1 capsule (300 mg total) by mouth 2 (two) times daily.   LORazepam 0.5 MG tablet Commonly known as:  ATIVAN Take 1 tablet (0.5 mg total) by mouth every 6 (six) hours as needed (Nausea or vomiting).   ondansetron 8 MG tablet Commonly known as:  ZOFRAN Take 1 tablet (8 mg total) by mouth 2 (two) times daily as needed (Nausea or vomiting).   oxyCODONE-acetaminophen 5-325 MG tablet Commonly known as:  PERCOCET/ROXICET Take 1-2 tablets by mouth every 6 (six) hours as needed for severe pain. What changed:  Another medication with the same name was removed. Continue taking this medication, and follow the directions you see here.   prochlorperazine 10 MG tablet Commonly known as:  COMPAZINE Take 1 tablet (10 mg total) by mouth every 6 (six) hours as needed (Nausea or vomiting).         Disposition and Follow-up:   Significant Diagnostic Studies:  Dg Chest 2 View  Result Date: 10/20/2018 CLINICAL DATA:  Chest pain EXAM: CHEST - 2 VIEW COMPARISON:  PET CT 09/26/2018 FINDINGS: There is an accessed left chest wall Port-A-Cath the follows a left internal jugular vein approach to terminate in the lower SVC. Cardiomediastinal contours are normal. There is large area of consolidation in the right mid lung with mild airspace opacity in the left mid lung. No pleural effusion or pneumothorax. IMPRESSION: Moderate right and mild left mid lung airspace opacities, which may be secondary to the patient's lymphoma, though superimposed infectious pneumonia could also have this appearance. Electronically Signed   By: Ulyses Jarred M.D.   On: 10/20/2018 20:00    Discharge Laboratory Values: . CBC Latest Ref Rng & Units 11/16/2018 11/15/2018 11/14/2018  WBC 4.0 - 10.5 K/uL 10.8(H) 7.6 5.9  Hemoglobin 12.0 - 15.0 g/dL 10.1(L) 11.1(L) 11.0(L)  Hematocrit 36.0 - 46.0 % 32.5(L) 35.5(L) 35.2(L)  Platelets 150 - 400 K/uL 285 363 368   . CMP Latest Ref Rng & Units 11/16/2018 11/15/2018 11/14/2018  Glucose 70 - 99 mg/dL 104(H) 144(H) 71  BUN 6 - 20 mg/dL 14 11 9   Creatinine 0.44 - 1.00 mg/dL 0.62 0.62 0.60  Sodium 135 - 145 mmol/L 140 136 139  Potassium 3.5 - 5.1 mmol/L 4.3 5.1 4.0  Chloride 98 - 111 mmol/L 105 102 104  CO2 22 - 32 mmol/L  28 27 28   Calcium 8.9 - 10.3 mg/dL 8.6(L) 9.4 8.9  Total Protein 6.5 - 8.1 g/dL 6.5 7.5 7.1  Total Bilirubin 0.3 - 1.2 mg/dL 0.4 0.8 0.5  Alkaline Phos 38 - 126 U/L 80 100 98  AST 15 - 41 U/L 14(L) 17 15  ALT 0 - 44 U/L 11 12 11      Brief H and P: For complete details please refer to admission H and P, but in brief,  Heidi [name] is a 32 y.o. female who has been admitted today for C2 ICE treatment of her Relapsed Hodgkin's Lymphoma in 3 rd line setting. The pt reports that she is doing well overall.   The pt reports that she has  continued to experience pain related to her post-herpetic neuralgias. She has continued moving her bowels well and denies any abdominal pains.   Lab results today (11/14/18) of CBC w/diff and CMP is as follows: all values are WNL except for RBC at 3.69, HGB at 11.0, HCT at 35.2, RDW at 18.0, Lymphs abs at 200, Monocytes abs at 1.1k.  On review of systems, pt reports post herpetic neuralgia pain, moving her bowels well, good energy levels, and denies nausea, vomiting, diarrhea, problems breathing, abdominal pains, leg swelling, and any other symptoms.   Breathing much improved after 1st cycle of ICE. She did have significant anemia and thrombocytopenia and needed transfusion support after her 1st cycle of ICE and will need to be monitored closely.  We did discuss need for close monitoring and compliance with followup.  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 She was noted to have significant constitutional symptoms including significant weight loss, fevers chills and some night sweats. HIV negative Hepatitis C and hepatitis B serologies negative. Echo with normal ejection fraction. Patient was treated with 5 cycles of AVD (Bleomycin held due to poor DLCO 37% and ongoing smoking). Multiple avoidable treatment delays due to the patient's noncompliance with follow-up for avoidable reasons. She has been counseled repeatedly that this would increase the likelihood of unfavorable outcome.  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 S/p 6 cycles of 2nd line treatment with Bendamustine/Brentuximab And 1 cycle of Bretuximab alone  -PET/CT scan results from 04/14/2017 were discussed in details. She appears to have some persistent disease in her right lung at Deauville 5.  It is difficult to say if this is recurrent or persistent disease since the patient failed to follow-up on multiple scheduled PET/CT scans in the early part and prior to her second line treatment.  Patient was lost to followup for >1 yr  09/26/18 PET/CT revealed Imaging findings compatible with recurrence of disease. 2. Multiple new large areas of hypermetabolic nodularity and airspace consolidation within both lungs which is presumed to represent pulmonary involvement by lymphoma. Deauville criteria 5. 3. New hypermetabolic left supraclavicular, left retroperitoneal, and bilateral pelvic lymph nodes. Deauville criteria 5. 4. Multifocal hypermetabolic osseous lesions. Deauville criteria 5. 5. Small volume of ascites, new.   #2 s/p hypoxic respiratory failurewith dense right lung consolidation and left upper lobe consolidation with SVC syndrome. Patient has completed palliative radiation to the right lung mass causing SVC compression.  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  #4 Non compliancewith clinic and treatment followup. Missed 2nd dose of bendamustine with C2. Has missed multiple appointment for  her PET/CT and missed appointment at [facility] for consideration of Transplant.  Pt was lost to follow up after July 2018 and returned on 08/31/18 Discussed the patient's goals of care and the pt noted that she is ready to begin treatment again and maintain compliance and follow ups   #5 h/o Grade 1 neuropathyfrom Brentuximab-Vedotin - resolved  #6 Shingles outbreak - curentlyresolved but with significant post herpetic neuralgia  #7 Significant anemia and thrombocytopenia after C1 of ICE -- will need close monitoring.  PLAN: -Discussed pt labwork today, 11/16/18; blood counts and chemistries are stable -The pt has no prohibitive toxicities from continuing C2D3 ICE  at this time.  no hematuria of bladder pain. -Continue  eating well, staying hydrated, and staying as active as reasonably possible    -will need close monitoring of her labs as outpatient and likely rpt transfusion support. -continue Gabapentin , Amitryptiline and Prn percocet for PHN. -Continue Acyclovir for shingles prophylaxis.  -Will repeat PET/CT and PFT in 3 weeks -Neulasta as outpatient on 11/17/18 -Recommend pt set up next appointment with Dr. Jolayne Haines for after repeat PFT and PET/CT are completed -  #8 DVT Prophylaxis - lovenox    Physical Exam at Discharge: BP 132/88 (BP Location: Left Arm)   Pulse 79   Temp 98.3 F (36.8 C) (Oral)   Resp 16   SpO2 98%  . GENERAL:alert, in no acute distress and comfortable SKIN: no acute rashes, no significant lesions EYES: conjunctiva are pink and non-injected, sclera anicteric OROPHARYNX: MMM, no exudates, no oropharyngeal erythema or ulceration NECK: supple, no JVD LYMPH:  no palpable lymphadenopathy in the cervical, axillary or inguinal regions LUNGS: scattered fine rales and rhonci. Overall improved air entry HEART: regular rate & rhythm ABDOMEN:  normoactive bowel sounds , non tender, not distended. Extremity: no pedal edema PSYCH: alert & oriented x 3 with fluent speech NEURO: no focal motor/sensory deficits     Hospital Course:  Active Problems:   Hodgkin's lymphoma ([place])   Post herpetic neuralgia   Diet:  Regular  Activity:  Infection precautions  Condition at Discharge:   Stable  Signed: Dr. Sullivan Lone MD [place] [phone]  11/16/2018, 4:25 PM  TT  Spent discharging patient > 74mins

## 2018-11-17 ENCOUNTER — Other Ambulatory Visit: Payer: Self-pay | Admitting: Hematology

## 2018-11-17 ENCOUNTER — Inpatient Hospital Stay: Payer: Medicaid Other

## 2018-11-17 VITALS — BP 127/71 | HR 96 | Temp 98.3°F | Resp 18

## 2018-11-17 DIAGNOSIS — Z7189 Other specified counseling: Secondary | ICD-10-CM

## 2018-11-17 DIAGNOSIS — C8128 Mixed cellularity classical Hodgkin lymphoma, lymph nodes of multiple sites: Secondary | ICD-10-CM

## 2018-11-17 MED ORDER — PEGFILGRASTIM INJECTION 6 MG/0.6ML ~~LOC~~
6.0000 mg | PREFILLED_SYRINGE | Freq: Once | SUBCUTANEOUS | Status: AC
  Administered 2018-11-17: 6 mg via SUBCUTANEOUS

## 2018-11-17 MED ORDER — PEGFILGRASTIM INJECTION 6 MG/0.6ML ~~LOC~~
PREFILLED_SYRINGE | SUBCUTANEOUS | Status: AC
  Filled 2018-11-17: qty 0.6

## 2018-11-17 MED ORDER — OXYCODONE-ACETAMINOPHEN 5-325 MG PO TABS: 1.0000 | ORAL_TABLET | Freq: Four times a day (QID) | ORAL | 0 refills | Status: DC | PRN

## 2018-11-17 NOTE — Patient Instructions (Signed)
Pegfilgrastim injection What is this medicine? PEGFILGRASTIM (PEG fil gra stim) is a long-acting granulocyte colony-stimulating factor that stimulates the growth of neutrophils, a type of white blood cell important in the body's fight against infection. It is used to reduce the incidence of fever and infection in patients with certain types of cancer who are receiving chemotherapy that affects the bone marrow, and to increase survival after being exposed to high doses of radiation. This medicine may be used for other purposes; ask your health care provider or pharmacist if you have questions. COMMON BRAND NAME(S): Neulasta What should I tell my health care provider before I take this medicine? They need to know if you have any of these conditions: -kidney disease -latex allergy -ongoing radiation therapy -sickle cell disease -skin reactions to acrylic adhesives (On-Body Injector only) -an unusual or allergic reaction to pegfilgrastim, filgrastim, other medicines, foods, dyes, or preservatives -pregnant or trying to get pregnant -breast-feeding How should I use this medicine? This medicine is for injection under the skin. If you get this medicine at home, you will be taught how to prepare and give the pre-filled syringe or how to use the On-body Injector. Refer to the patient Instructions for Use for detailed instructions. Use exactly as directed. Tell your healthcare provider immediately if you suspect that the On-body Injector may not have performed as intended or if you suspect the use of the On-body Injector resulted in a missed or partial dose. It is important that you put your used needles and syringes in a special sharps container. Do not put them in a trash can. If you do not have a sharps container, call your pharmacist or healthcare provider to get one. Talk to your pediatrician regarding the use of this medicine in children. While this drug may be prescribed for selected conditions,  precautions do apply. Overdosage: If you think you have taken too much of this medicine contact a poison control center or emergency room at once. NOTE: This medicine is only for you. Do not share this medicine with others. What if I miss a dose? It is important not to miss your dose. Call your doctor or health care professional if you miss your dose. If you miss a dose due to an On-body Injector failure or leakage, a new dose should be administered as soon as possible using a single prefilled syringe for manual use. What may interact with this medicine? Interactions have not been studied. Give your health care provider a list of all the medicines, herbs, non-prescription drugs, or dietary supplements you use. Also tell them if you smoke, drink alcohol, or use illegal drugs. Some items may interact with your medicine. This list may not describe all possible interactions. Give your health care provider a list of all the medicines, herbs, non-prescription drugs, or dietary supplements you use. Also tell them if you smoke, drink alcohol, or use illegal drugs. Some items may interact with your medicine. What should I watch for while using this medicine? You may need blood work done while you are taking this medicine. If you are going to need a MRI, CT scan, or other procedure, tell your doctor that you are using this medicine (On-Body Injector only). What side effects may I notice from receiving this medicine? Side effects that you should report to your doctor or health care professional as soon as possible: -allergic reactions like skin rash, itching or hives, swelling of the face, lips, or tongue -dizziness -fever -pain, redness, or irritation at site   where injected -pinpoint red spots on the skin -red or dark-brown urine -shortness of breath or breathing problems -stomach or side pain, or pain at the shoulder -swelling -tiredness -trouble passing urine or change in the amount of urine Side  effects that usually do not require medical attention (report to your doctor or health care professional if they continue or are bothersome): -bone pain -muscle pain This list may not describe all possible side effects. Call your doctor for medical advice about side effects. You may report side effects to FDA at 1-800-FDA-1088. Where should I keep my medicine? Keep out of the reach of children. Store pre-filled syringes in a refrigerator between 2 and 8 degrees C (36 and 46 degrees F). Do not freeze. Keep in carton to protect from light. Throw away this medicine if it is left out of the refrigerator for more than 48 hours. Throw away any unused medicine after the expiration date. NOTE: This sheet is a summary. It may not cover all possible information. If you have questions about this medicine, talk to your doctor, pharmacist, or health care provider.  2018 Elsevier/Gold Standard (2016-12-09 12:58:03)  

## 2018-11-21 NOTE — Progress Notes (Signed)
[name]  HEMATOLOGY ONCOLOGY PROGRESS NOTE  Date of service:   11/22/18     Patient Care Team: Clinic, General Medical as PCP - General (Family Medicine)  Chief complaint: Follow-up for Hodgkin's lymphoma  Diagnosis:   Refractory Mixed cellularity Hodgkin's lymphoma IVBE with extensive lymphadenopathy including right axillary, mediastinal and upper retroperitoneal and now biopsy proven pulmonary involvement. She was noted to have significant constitutional symptoms including significant weight loss, fevers chills and some night sweats.  Current Treatment: Third line therapy ICE for 2 planned cycles.  She was lost to f/u for >69yr Previous treatment 5 cycles of AVD (without bleomycin due to lung involvement and DLCO of 37%, active smoker) Multiple avoidable treatment delays due to the patient's noncompliance with follow-up for avoidable reasons. She has been counseled repeatedly that this would increase the likelihood of unfavorable outcome.  2nd line therapy with Bendamustine + Brentuximab s/p 7 cycles.  INTERVAL HISTORY:  Ms WSchmelzleis here for follow-up for her Hodgkins lymphoma. I last saw the pt on 11/16/18 with C2 ICE discharge. She is accompanied today by her son. The pt reports that she is doing well overall.   The pt received her Neulasta shot one week ago. She notes that her energy levels have been stable.   The pt notes that she is able to walk around without developing SOB, and denies needing to use oxygen.   Lab results today (11/22/18) of CBC w/diff and CMP is as follows: all values are WNL except for WBC at 1.1k, RBC at 3.60, HGB at 11.1, HCT at 33.8, RDW at 16.7, ANC at 900, Lymphs abs at 0.0k, Alk Phos at 167.  On review of systems, pt reports stable energy levels, and denies abdominal pains, problems passing urine, leg swelling, difficulty breathing, concerns for infections, and any other symptoms.    REVIEW OF SYSTEMS:    A 10+ POINT REVIEW OF SYSTEMS WAS OBTAINED  including neurology, dermatology, psychiatry, cardiac, respiratory, lymph, extremities, GI, GU, Musculoskeletal, constitutional, breasts, reproductive, HEENT.  All pertinent positives are noted in the HPI.  All others are negative.  . Past Medical History:  Diagnosis Date  . 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 Asthma   . Hodgkin lymphoma ([place]   . Hypertension     . Past Surgical History:  Procedure Laterality Date  . AXILLARY LYMPH NODE BIOPSY Right 03/19/2016   Procedure: AXILLARY LYMPH NODE BIOPSY;  Surgeon: TArmandina Gemma MD;  Location: [facility];  Service: General;  Laterality: Right;  . IR GENERIC HISTORICAL  12/22/2016   IR FLUORO GUIDE PORT INSERTION LEFT 12/22/2016 [facility]  . IR GENERIC HISTORICAL  12/22/2016   IR [place] V[place] ACC[place] LEFT 12/22/2016 [facility]  . IR GENERIC HISTORICAL  12/22/2016   IR CV LINE INJECTION 12/22/2016 [facility]  . IR GENERIC HISTORICAL  12/22/2016   IR REMOVAL TUN ACCESS W/ PORT W/O FL MOD SED 12/22/2016 [facility]  . VIDEO BRONCHOSCOPY Bilateral 11/26/2016   Procedure: VIDEO BRONCHOSCOPY WITH FLUORO;  Surgeon: RRigoberto Noel MD;  Location: [facility] ENDOS[facility];  Service: Cardiopulmonary;  Laterality: Bilateral;    . Social History   Tobacco Use  . Smoking status: Former Smoker    Packs/day: 0.50    Years: 15.00    Pack years: 7.50    Types: Cigarettes    Last attempt to quit: 03/27/2016    Years since quitting: 2.6  . Smokeless tobacco: Never Used  Substance Use Topics  . Alcohol  use: Yes    Comment: occasional now  . Drug use: Not on file    ALLERGIES:  has No Known Allergies.  MEDICATIONS:  Current Outpatient Medications  Medication Sig Dispense Refill  . acyclovir (ZOVIRAX) 400 MG tablet Take 1 tablet (400 mg total) by mouth 2 (two) times daily. Start after completion of Valtrex 60 tablet 6  . amitriptyline (ELAVIL) 10 MG tablet Take 1 tablet (10 mg total) by mouth at bedtime. (Patient not taking: Reported  on 11/14/2018) 20 tablet 0  . gabapentin (NEURONTIN) 300 MG capsule Take 1 capsule (300 mg total) by mouth 2 (two) times daily. 60 capsule 0  . LORazepam (ATIVAN) 0.5 MG tablet Take 1 tablet (0.5 mg total) by mouth every 6 (six) hours as needed (Nausea or vomiting). 30 tablet 0  . ondansetron (ZOFRAN) 8 MG tablet Take 1 tablet (8 mg total) by mouth 2 (two) times daily as needed (Nausea or vomiting). 30 tablet 1  . oxyCODONE-acetaminophen (PERCOCET/ROXICET) 5-325 MG tablet Take 1-2 tablets by mouth every 6 (six) hours as needed for severe pain. 30 tablet 0  . prochlorperazine (COMPAZINE) 10 MG tablet Take 1 tablet (10 mg total) by mouth every 6 (six) hours as needed (Nausea or vomiting). 30 tablet 1   No current facility-administered medications for this visit.     PHYSICAL EXAMINATION: ECOG PERFORMANCE STATUS: 1 - Symptomatic but completely ambulatory  Vitals:   11/22/18 1111  BP: 123/79  Pulse: 90  Resp: 18  Temp: 98.1 F (36.7 C)  SpO2: 98%    Filed Weights   11/22/18 1111  Weight: 128 lb (58.1 kg)   .Body mass index is 20.05 kg/m.  GENERAL:alert, in no acute distress and comfortable SKIN: no acute rashes, skin changes over chest wall from resolved shingles  EYES: conjunctiva are pink and non-injected, sclera anicteric OROPHARYNX: MMM, no exudates, no oropharyngeal erythema or ulceration NECK: supple, no JVD LYMPH:  no palpable lymphadenopathy in the cervical, axillary or inguinal regions LUNGS: b/l scattered rhonchi, no rales  HEART: regular rate & rhythm ABDOMEN:  normoactive bowel sounds , non tender, not distended. No palpable hepatosplenomegaly.  Extremity: no pedal edema PSYCH: alert & oriented x 3 with fluent speech NEURO: no focal motor/sensory deficits      LABORATORY DATA:   I have reviewed the data as listed  . CBC Latest Ref Rng & Units 11/22/2018 11/16/2018 11/15/2018  WBC 4.0 - 10.5 K/uL 1.1(L) 10.8(H) 7.6  Hemoglobin 12.0 - 15.0 g/dL 11.1(L) 10.1(L)  11.1(L)  Hematocrit 36.0 - 46.0 % 33.8(L) 32.5(L) 35.5(L)  Platelets 150 - 400 K/uL 153 285 363   . CBC    Component Value Date/Time   WBC 1.1 (L) 11/22/2018 0905   RBC 3.60 (L) 11/22/2018 0905   HGB 11.1 (L) 11/22/2018 0905   HGB 10.6 (L) 11/07/2018 1047   HGB 11.7 07/20/2017 1315   HCT 33.8 (L) 11/22/2018 0905   HCT 35.2 07/20/2017 1315   PLT 153 11/22/2018 0905   PLT 473 (H) 11/07/2018 1047   PLT 266 07/20/2017 1315   MCV 93.9 11/22/2018 0905   MCV 93.2 07/20/2017 1315   MCH 30.8 11/22/2018 0905   MCHC 32.8 11/22/2018 0905   RDW 16.7 (H) 11/22/2018 0905   RDW 14.5 07/20/2017 1315   LYMPHSABS 0.0 (L) 11/22/2018 0905   LYMPHSABS 0.3 (L) 07/20/2017 1315   MONOABS 0.1 11/22/2018 0905   MONOABS 0.5 07/20/2017 1315   EOSABS 0.0 11/22/2018 0905   EOSABS 0.2 07/20/2017 1315  BASOSABS 0.0 11/22/2018 0905   BASOSABS 0.0 07/20/2017 1315   . CMP Latest Ref Rng & Units 11/22/2018 11/16/2018 11/15/2018  Glucose 70 - 99 mg/dL 77 104(H) 144(H)  BUN 6 - 20 mg/dL _0 Creatinine 0.44 - 1.00 mg/dL 0.70 0.62 0.62  Sodium 135 - 145 mmol/L 140 140 136  Potassium 3.5 - 5.1 mmol/L 4.3 4.3 5.1  Chloride 98 - 111 mmol/L 105 105 102  CO2 22 - 32 mmol/L _1 Calcium 8.9 - 10.3 mg/dL 9.1 8.6(L) 9.4  Total Protein 6.5 - 8.1 g/dL 7.4 6.5 7.5  Total Bilirubin 0.3 - 1.2 mg/dL 0.6 0.4 0.8  Alkaline Phos 38 - 126 U/L 167(H) 80 100  AST 15 - 41 U/L 16 14(L) 17  ALT 0 - 44 U/L _2 RADIOGRAPHIC STUDIES: I have personally reviewed the radiological images as listed and agreed with the findings in the report. No results found.  ASSESSMENT & PLAN:   32 y.o. 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 She was noted to have significant constitutional symptoms including significant weight  loss, fevers chills and some night sweats. HIV negative Hepatitis C and hepatitis B serologies negative. Echo with normal ejection fraction. Patient was treated with 5 cycles of AVD (Bleomycin held due to poor DLCO 37% and ongoing smoking). Multiple avoidable treatment delays due to the patient's noncompliance with follow-up for avoidable reasons. She has been counseled repeatedly that this would increase the likelihood of unfavorable outcome.  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 S/p 6 cycles of 2nd line treatment with Bendamustine/Brentuximab And 1 cycle of Bretuximab alone  -PET/CT scan results from 04/14/2017 were discussed in details. She appears to have some persistent disease in her right lung at Deauville 5. It is difficult to say if this is recurrent or persistent disease since the patient failed to follow-up on multiple scheduled PET/CT scans in the early part and prior to her second line treatment.  Patient was lost to followup for >1 yr  09/26/18 PET/CT revealed Imaging findings compatible with recurrence of disease. 2. Multiple new large areas of hypermetabolic nodularity and airspace consolidation within both lungs which is presumed to represent pulmonary involvement by lymphoma. Deauville criteria 5. 3. New hypermetabolic left supraclavicular, left retroperitoneal, and bilateral pelvic lymph nodes. Deauville criteria 5. 4. Multifocal hypermetabolic osseous lesions. Deauville criteria 5. 5. Small volume of ascites, new.   #2 s/p hypoxic respiratory failurewith dense right lung consolidation and left upper lobe consolidation with SVC syndrome. Patient has completed palliative radiation to the right lung mass causing SVC compression.  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  #4 Non compliancewith clinic and treatment followup. Missed 2nd dose of bendamustine with C2. Has missed multiple  appointment for her PET/CT and missed appointment at [facility] for consideration of Transplant.  Pt was lost to follow up after July 2018 and returned on 08/31/18 Discussed the patient's goals of care and the pt noted that she is ready to begin treatment again and maintain compliance and follow ups   #5 h/o Grade 1 neuropathyfrom Brentuximab-Vedotin - resolved  #6 Shingles outbreak - curentlyresolved but with significant post herpetic neuralgia  #7 Significant anemia and thrombocytopenia after C1 of ICE -- will need close monitoring.  PLAN: -Discussed  pt labwork today, 11/22/18; HGB stable at 11.1, neutropenic with ANC at 900, Alk Phos at 167 -Advised strict neutropenic preventative strategies, and crowd avoidance, though expecting Neulasta support given 7 days ago to increase counts -Discussed 11/22/18 PFT which revealed some improvement  in DLCO, however some element of restriction and obstruction is noted -Advised pt to call Dr. J[facility]ayne Haines at [facility] to set up next appointment for after the PET/CT in 2 weeks -Will order labs again in one week -Will complete PET/CT in 10 days  -continueGabapentin,Amitryptiline andPrn percocet for PHN. -Continue Acyclovirfor shingles prophylaxis. -Will see the pt back in 2 weeks    #8 DVT Prophylaxis - lovenox    Labs in 1 week PET/CT in 10 days RTC with Dr Irene Limbo in 2 weeks with labs and PET   The total time spent in the appt was 20 minutes and more than 50% was on counseling and direct patient cares.     Sullivan Lone MD [place] AAHIVMS [facility] Hematology/Oncology Physician [facility]  (Office):       [phone] (Work cell):  [phone] (Fax):           [phone]  I, Baldwin [place], am acting as a scribe for Dr. Sullivan Lone.   .I have reviewed the above documentation for accuracy and completeness, and I agree with the above. Brunetta Genera MD

## 2018-11-22 ENCOUNTER — Ambulatory Visit (HOSPITAL_COMMUNITY)
Admission: RE | Admit: 2018-11-22 | Discharge: 2018-11-22 | Disposition: A | Discharge status: Home / Self Care | Payer: Medicaid Other | Source: Ambulatory Visit | Attending: Hematology | Admitting: Hematology

## 2018-11-22 ENCOUNTER — Inpatient Hospital Stay (HOSPITAL_BASED_OUTPATIENT_CLINIC_OR_DEPARTMENT_OTHER): Payer: Medicaid Other | Admitting: Hematology

## 2018-11-22 ENCOUNTER — Inpatient Hospital Stay: Payer: Medicaid Other

## 2018-11-22 VITALS — BP 123/79 | HR 90 | Temp 98.1°F | Resp 18 | Ht 67.0 in | Wt 128.0 lb

## 2018-11-22 DIAGNOSIS — Z79899 Other long term (current) drug therapy: Secondary | ICD-10-CM

## 2018-11-22 DIAGNOSIS — C8128 Mixed cellularity classical Hodgkin lymphoma, lymph nodes of multiple sites: Secondary | ICD-10-CM

## 2018-11-22 DIAGNOSIS — D701 Agranulocytosis secondary to cancer chemotherapy: Secondary | ICD-10-CM

## 2018-11-22 DIAGNOSIS — D6481 Anemia due to antineoplastic chemotherapy: Secondary | ICD-10-CM

## 2018-11-22 DIAGNOSIS — T451X5A Adverse effect of antineoplastic and immunosuppressive drugs, initial encounter: Secondary | ICD-10-CM | POA: Diagnosis present

## 2018-11-22 DIAGNOSIS — D696 Thrombocytopenia, unspecified: Secondary | ICD-10-CM | POA: Diagnosis present

## 2018-11-22 DIAGNOSIS — Z923 Personal history of irradiation: Secondary | ICD-10-CM

## 2018-11-22 DIAGNOSIS — Z87891 Personal history of nicotine dependence: Secondary | ICD-10-CM

## 2018-11-22 LAB — CBC WITH DIFFERENTIAL/PLATELET
ABS IMMATURE GRANULOCYTES: 0.01 10*3/uL (ref 0.00–0.07)
BASOS ABS: 0 10*3/uL (ref 0.0–0.1)
Basophils Relative: 2 %
Eosinophils Absolute: 0 10*3/uL (ref 0.0–0.5)
Eosinophils Relative: 1 %
HCT: 33.8 % — ABNORMAL LOW (ref 36.0–46.0)
Hemoglobin: 11.1 g/dL — ABNORMAL LOW (ref 12.0–15.0)
IMMATURE GRANULOCYTES: 1 %
LYMPHS ABS: 0 10*3/uL — AB (ref 0.7–4.0)
LYMPHS PCT: 3 %
MCH: 30.8 pg (ref 26.0–34.0)
MCHC: 32.8 g/dL (ref 30.0–36.0)
MCV: 93.9 fL (ref 80.0–100.0)
Monocytes Absolute: 0.1 10*3/uL (ref 0.1–1.0)
Monocytes Relative: 5 %
NEUTROS ABS: 0.9 10*3/uL — AB (ref 1.7–7.7)
NRBC: 0 % (ref 0.0–0.2)
Neutrophils Relative %: 88 %
PLATELETS: 153 10*3/uL (ref 150–400)
RBC: 3.6 MIL/uL — ABNORMAL LOW (ref 3.87–5.11)
RDW: 16.7 % — AB (ref 11.5–15.5)
WBC: 1.1 10*3/uL — AB (ref 4.0–10.5)

## 2018-11-22 LAB — PULMONARY FUNCTION TEST
DL/VA % PRED: 92 %
DL/VA: 4.73 ml/min/mmHg/L
DLCO UNC % PRED: 33 %
DLCO cor % pred: 38 %
DLCO cor: 10.81 ml/min/mmHg
DLCO unc: 9.53 ml/min/mmHg
FEF 25-75 PRE: 1.16 L/s
FEF2575-%PRED-PRE: 34 %
FEV1-%PRED-PRE: 44 %
FEV1-Pre: 1.33 L
FEV1FVC-%Pred-Pre: 90 %
FEV6-%PRED-PRE: 49 %
FEV6-PRE: 1.71 L
FEV6FVC-%PRED-PRE: 101 %
FVC-%Pred-Pre: 48 %
FVC-Pre: 1.71 L
Pre FEV1/FVC ratio: 77 %
Pre FEV6/FVC Ratio: 100 %
RV % PRED: 87 %
RV: 1.37 L
TLC % pred: 56 %
TLC: 3.1 L

## 2018-11-22 LAB — CMP (CANCER CENTER ONLY)
ALBUMIN: 3.5 g/dL (ref 3.5–5.0)
ALT: 18 U/L (ref 0–44)
AST: 16 U/L (ref 15–41)
Alkaline Phosphatase: 167 U/L — ABNORMAL HIGH (ref 38–126)
Anion gap: 9 (ref 5–15)
BUN: 12 mg/dL (ref 6–20)
CHLORIDE: 105 mmol/L (ref 98–111)
CO2: 26 mmol/L (ref 22–32)
Calcium: 9.1 mg/dL (ref 8.9–10.3)
Creatinine: 0.7 mg/dL (ref 0.44–1.00)
GFR, Est AFR Am: >60 mL/min (ref >60)
Glucose, Bld: 77 mg/dL (ref 70–99)
POTASSIUM: 4.3 mmol/L (ref 3.5–5.1)
SODIUM: 140 mmol/L (ref 135–145)
Total Bilirubin: 0.6 mg/dL (ref 0.3–1.2)
Total Protein: 7.4 g/dL (ref 6.5–8.1)

## 2018-11-22 LAB — SAMPLE TO BLOOD BANK

## 2018-11-29 ENCOUNTER — Inpatient Hospital Stay: Payer: Medicaid Other | Attending: Hematology

## 2018-11-29 DIAGNOSIS — C8128 Mixed cellularity classical Hodgkin lymphoma, lymph nodes of multiple sites: Secondary | ICD-10-CM | POA: Insufficient documentation

## 2018-11-29 LAB — CMP (CANCER CENTER ONLY)
ALT: 12 U/L (ref 0–44)
AST: 17 U/L (ref 15–41)
Albumin: 3.7 g/dL (ref 3.5–5.0)
Alkaline Phosphatase: 179 U/L — ABNORMAL HIGH (ref 38–126)
Anion gap: 13 (ref 5–15)
BUN: 9 mg/dL (ref 6–20)
CHLORIDE: 107 mmol/L (ref 98–111)
CO2: 24 mmol/L (ref 22–32)
Calcium: 9.4 mg/dL (ref 8.9–10.3)
Creatinine: 0.81 mg/dL (ref 0.44–1.00)
Glucose, Bld: 84 mg/dL (ref 70–99)
POTASSIUM: 4.9 mmol/L (ref 3.5–5.1)
Sodium: 144 mmol/L (ref 135–145)
Total Bilirubin: 0.3 mg/dL (ref 0.3–1.2)
Total Protein: 7.5 g/dL (ref 6.5–8.1)

## 2018-11-29 LAB — CBC WITH DIFFERENTIAL/PLATELET
Abs Immature Granulocytes: 0.08 10*3/uL — ABNORMAL HIGH (ref 0.00–0.07)
BASOS ABS: 0 10*3/uL (ref 0.0–0.1)
Basophils Relative: 0 %
Eosinophils Absolute: 0 10*3/uL (ref 0.0–0.5)
Eosinophils Relative: 0 %
HCT: 32.7 % — ABNORMAL LOW (ref 36.0–46.0)
HEMOGLOBIN: 10.7 g/dL — AB (ref 12.0–15.0)
IMMATURE GRANULOCYTES: 1 %
Lymphocytes Relative: 1 %
Lymphs Abs: 0.1 10*3/uL — ABNORMAL LOW (ref 0.7–4.0)
MCH: 30.6 pg (ref 26.0–34.0)
MCHC: 32.7 g/dL (ref 30.0–36.0)
MCV: 93.4 fL (ref 80.0–100.0)
Monocytes Absolute: 0.6 10*3/uL (ref 0.1–1.0)
Monocytes Relative: 10 %
NEUTROS ABS: 5.8 10*3/uL (ref 1.7–7.7)
NRBC: 0 % (ref 0.0–0.2)
Neutrophils Relative %: 88 %
Platelets: 36 10*3/uL — ABNORMAL LOW (ref 150–400)
RBC: 3.5 MIL/uL — AB (ref 3.87–5.11)
RDW: 15.8 % — ABNORMAL HIGH (ref 11.5–15.5)
WBC: 6.6 10*3/uL (ref 4.0–10.5)

## 2018-11-29 LAB — SAMPLE TO BLOOD BANK

## 2018-12-06 ENCOUNTER — Other Ambulatory Visit: Payer: Self-pay | Admitting: *Deleted

## 2018-12-06 ENCOUNTER — Other Ambulatory Visit: Payer: Medicaid Other

## 2018-12-06 ENCOUNTER — Ambulatory Visit: Payer: Medicaid Other | Admitting: Hematology

## 2018-12-06 ENCOUNTER — Ambulatory Visit (HOSPITAL_COMMUNITY)
Admission: RE | Admit: 2018-12-06 | Discharge: 2018-12-06 | Disposition: A | Discharge status: Home / Self Care | Payer: Medicaid Other | Source: Ambulatory Visit | Attending: Hematology | Admitting: Hematology

## 2018-12-06 ENCOUNTER — Telehealth: Payer: Self-pay | Admitting: Hematology

## 2018-12-06 ENCOUNTER — Telehealth: Payer: Self-pay | Admitting: *Deleted

## 2018-12-06 DIAGNOSIS — C8128 Mixed cellularity classical Hodgkin lymphoma, lymph nodes of multiple sites: Secondary | ICD-10-CM | POA: Diagnosis present

## 2018-12-06 LAB — GLUCOSE, CAPILLARY: GLUCOSE-CAPILLARY: 73 mg/dL (ref 70–99)

## 2018-12-06 MED ORDER — GABAPENTIN 300 MG PO CAPS: 300.0000 mg | ORAL_CAPSULE | Freq: Two times a day (BID) | ORAL | 0 refills | Status: DC

## 2018-12-06 MED ORDER — FLUDEOXYGLUCOSE F - 18 (FDG) INJECTION
6.3700 | Freq: Once | INTRAVENOUS | Status: AC | PRN
  Administered 2018-12-06: 6.37 via INTRAVENOUS

## 2018-12-06 NOTE — Telephone Encounter (Signed)
Patient needs refill of Gabapentin and to have today's appt rescheduled for labs and MD visit. Had PET scan this am and had to return home due to childcare issues.

## 2018-12-06 NOTE — Telephone Encounter (Signed)
Called patient per 12/11 sch message - no answer - left message for patient to call back to r/s

## 2018-12-08 ENCOUNTER — Telehealth: Payer: Self-pay

## 2018-12-08 NOTE — Telephone Encounter (Signed)
Spoke with patient concerning newly added appointments. Per 12/13 voice mail return call. Mailed a letter with a calender enclosed

## 2018-12-14 ENCOUNTER — Telehealth: Payer: Self-pay | Admitting: *Deleted

## 2018-12-14 NOTE — Telephone Encounter (Signed)
Contacted by Dr. Jolayne Haines office at [facility], [phone]. She is seeing patient today and asked to speak with MD. Bufford Buttner contact info for Dr. Irene Limbo to share with Dr. Jolayne Haines.

## 2018-12-25 ENCOUNTER — Other Ambulatory Visit: Payer: Self-pay | Admitting: Internal Medicine

## 2018-12-25 ENCOUNTER — Telehealth: Payer: Self-pay | Admitting: *Deleted

## 2018-12-25 MED ORDER — OXYCODONE-ACETAMINOPHEN 5-325 MG PO TABS: 1.0000 | ORAL_TABLET | Freq: Four times a day (QID) | ORAL | 0 refills | Status: DC | PRN

## 2018-12-25 NOTE — Telephone Encounter (Signed)
Patient left voice mail: Pain over ribs very severe, has been trying OTC meds since oxycodone ran out. Advised her that Dr. Earlie Server will send in 5 days of pain medication to her pharmacy and that Dr. Irene Limbo will be informed on his return 12/28/18.

## 2019-01-01 NOTE — Progress Notes (Signed)
Heidi [name]  HEMATOLOGY ONCOLOGY PROGRESS NOTE  Date of service:   01/02/19     Patient Care Team: Clinic, General Medical as PCP - General (Family Medicine)  Chief complaint: Follow-up for Hodgkin's lymphoma  Diagnosis:   Refractory Mixed cellularity Hodgkin's lymphoma IVBE with extensive lymphadenopathy including right axillary, mediastinal and upper retroperitoneal and now biopsy proven pulmonary involvement. She was noted to have significant constitutional symptoms including significant weight loss, fevers chills and some night sweats.  Current Treatment: Third line therapy ICE for 2 planned cycles.  She was lost to f/u for >28yr  Previous treatment 5 cycles of AVD (without bleomycin due to lung involvement and DLCO of 37%, active smoker) Multiple avoidable treatment delays due to the patient's noncompliance with follow-up for avoidable reasons. She has been counseled repeatedly that this would increase the likelihood of unfavorable outcome.  2nd line therapy with Bendamustine + Brentuximab s/p 7 cycles.  3rd line therapy with ICE s/p 2 cycles   INTERVAL HISTORY:  [name] is here for follow-up for her Hodgkins lymphoma after completing 2 cycles of ICE. The patient's last visit with [place] was on 11/22/18. She is accompanied today by her son. The pt reports that she is doing well overall.   In the interim, the pt was able to meet with Dr. Jolayne Haines at [facility], and the pt was not felt to be a transplant candidate at this time. I also spoke with Dr. Jolayne Haines and Ardine Eng has been recommended.  The pt reports that she has continued to not have difficulty breathing and is able to ambulate without feeling overtly SOB. 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 She has continued on 300mg  Gabapentin BID and Amitriptyline every day. She has continued on Acyclovir, and denies any new rashes. She notes that the rash under her right breast "still looks fresh," and denies this  being new.   Of note since the patient's last visit, pt has had a PET/CT completed on 12/06/18 with results revealing Generally improved appearance with previously mostly Deauville 5 activity now mostly Deauville 4 activity. 2. Layering of much of the airspace opacity in the lungs, although considerable right perihilar airspace opacity remains. The remaining pulmonary opacities are assess this Deauville 5 on the right and over L4 on the left, and were previously Deauville 5 bilaterally. 3. Mildly reduced size and moderately reduced activity in the abdominopelvic lymph nodes which are now dove L4. 4. The previously seen left supraclavicular lymph node seems to have completely resolved (Deauville 0). 5. The skeletal lesions remain at Deauville 4, although in absolute terms have decreased in SUV compared to previous. 6. No new regions of malignant involvement compared to prior exam. 7. Other imaging findings of potential clinical significance: Low-density blood pool suggests anemia. Pectus excavatum. Volume loss in the right hemithorax.  Lab results today (01/02/19) of CBC w/diff and CMP is as follows: all values are WNL except for WBC at 1.9k, RBC at 3.76, HGB at 11.7, RDW at 17.5, ANC at 1.3k, Lymphs abs at 300, Glucose at 115.  On review of systems, pt reports painful post-herpetic neuralgias, good energy levels, eating well, and denies difficulty breathing, new skin rashes, abdominal pains, new back pains, leg swelling, and any other symptoms.    REVIEW OF SYSTEMS:    A 10+ POINT REVIEW OF SYSTEMS WAS OBTAINED including neurology, dermatology, psychiatry, cardiac, respiratory, lymph, extremities, GI, GU, Musculoskeletal, constitutional, breasts, reproductive, HEENT.  All pertinent positives are noted in the HPI.  All others are negative.  . Past Medical History:  Diagnosis Date  . ARDS (adult respiratory distress syndrome) ([place])   . Asthma   . Hodgkin lymphoma ([place])   . Hypertension     . Past  Surgical History:  Procedure Laterality Date  . AXILLARY LYMPH NODE BIOPSY Right 03/19/2016   Procedure: AXILLARY LYMPH NODE BIOPSY;  Surgeon: Armandina Gemma, MD;  Location: [facility];  Service: General;  Laterality: Right;  . IR GENERIC HISTORICAL  12/22/2016   IR FLUORO GUIDE PORT INSERTION LEFT 12/22/2016 [facility]  . IR GENERIC HISTORICAL  12/22/2016   IR US GUIDE VASC ACCESS LEFT 12/22/2016 [facility]  . IR GENERIC HISTORICAL  12/22/2016   IR CV LINE INJECTION 12/22/2016 [facility]  . IR GENERIC HISTORICAL  12/22/2016   IR REMOVAL TUN ACCESS W/ PORT W/O FL MOD SED 12/22/2016 [facility]  . VIDEO BRONCHOSCOPY Bilateral 11/26/2016   Procedure: VIDEO BRONCHOSCOPY WITH FLUORO;  Surgeon: Rigoberto Noel, MD;  Location: [facility];  Service: Cardiopulmonary;  Laterality: Bilateral;    . Social History   Tobacco Use  . Smoking status: Former Smoker    Packs/day: 0.50    Years: 15.00    Pack years: 7.50    Types: Cigarettes    Last attempt to quit: 03/27/2016    Years since quitting: 2.7  . Smokeless tobacco: Never Used  Substance Use Topics  . Alcohol use: Yes    Comment: occasional now  . Drug use: Not on file    ALLERGIES:  has No Known Allergies.  MEDICATIONS:  Current Outpatient Medications  Medication Sig Dispense Refill  . acyclovir (ZOVIRAX) 400 MG tablet Take 1 tablet (400 mg total) by mouth 2 (two) times daily. Start after completion of Valtrex 60 tablet 6  . amitriptyline (ELAVIL) 10 MG tablet Take 1 tablet (10 mg total) by mouth at bedtime. (Patient not taking: Reported on 11/14/2018) 20 tablet 0  . gabapentin (NEURONTIN) 300 MG capsule Take 1 capsule (300 mg total) by mouth 2 (two) times daily. 60 capsule 0  . LORazepam (ATIVAN) 0.5 MG tablet Take 1 tablet (0.5 mg total) by mouth every 6 (six) hours as needed (Nausea or vomiting). 30 tablet 0  . ondansetron (ZOFRAN) 8 MG tablet Take 1 tablet (8 mg total) by mouth 2 (two) times daily as needed (Nausea or  vomiting). 30 tablet 1  . oxyCODONE-acetaminophen (PERCOCET/ROXICET) 5-325 MG tablet Take 1-2 tablets by mouth every 6 (six) hours as needed for severe pain. 50 tablet 0  . prochlorperazine (COMPAZINE) 10 MG tablet Take 1 tablet (10 mg total) by mouth every 6 (six) hours as needed (Nausea or vomiting). 30 tablet 1   No current facility-administered medications for this visit.     PHYSICAL EXAMINATION: ECOG PERFORMANCE STATUS: 1 - Symptomatic but completely ambulatory  Vitals:   01/02/19 1416  BP: 114/81  Pulse: 90  Resp: 18  Temp: 97.8 F (36.6 C)  SpO2: 95%    Filed Weights   01/02/19 1416  Weight: 133 lb 3.2 oz (60.4 kg)   .Body mass index is 20.86 kg/m.  GENERAL:alert, in no acute distress and comfortable SKIN: no acute rashes, no significant lesions EYES: conjunctiva are pink and non-injected, sclera anicteric OROPHARYNX: MMM, no exudates, no oropharyngeal erythema or ulceration NECK: supple, no JVD LYMPH:  no palpable lymphadenopathy in the cervical, axillary or inguinal regions LUNGS: b/l scattered rhonchi, no rales HEART: regular rate & rhythm ABDOMEN:  normoactive bowel sounds , non  tender, not distended. No palpable hepatosplenomegaly.  Extremity: no pedal edema PSYCH: alert & oriented x 3 with fluent speech NEURO: no focal motor/sensory deficits   LABORATORY DATA:   I have reviewed the data as listed  . CBC Latest Ref Rng & Units 01/02/2019 11/29/2018 11/22/2018  WBC 4.0 - 10.5 K/uL 1.9(L) 6.6 1.1(L)  Hemoglobin 12.0 - 15.0 g/dL 11.7(L) 10.7(L) 11.1(L)  Hematocrit 36.0 - 46.0 % 36.4 32.7(L) 33.8(L)  Platelets 150 - 400 K/uL 195 36(L) 153   . CBC    Component Value Date/Time   WBC 1.9 (L) 01/02/2019 1316   RBC 3.76 (L) 01/02/2019 1316   HGB 11.7 (L) 01/02/2019 1316   HGB 10.6 (L) 11/07/2018 1047   HGB 11.7 07/20/2017 1315   HCT 36.4 01/02/2019 1316   HCT 35.2 07/20/2017 1315   PLT 195 01/02/2019 1316   PLT 473 (H) 11/07/2018 1047   PLT 266  07/20/2017 1315   MCV 96.8 01/02/2019 1316   MCV 93.2 07/20/2017 1315   MCH 31.1 01/02/2019 1316   MCHC 32.1 01/02/2019 1316   RDW 17.5 (H) 01/02/2019 1316   RDW 14.5 07/20/2017 1315   LYMPHSABS 0.3 (L) 01/02/2019 1316   LYMPHSABS 0.3 (L) 07/20/2017 1315   MONOABS 0.3 01/02/2019 1316   MONOABS 0.5 07/20/2017 1315   EOSABS 0.1 01/02/2019 1316   EOSABS 0.2 07/20/2017 1315   BASOSABS 0.0 01/02/2019 1316   BASOSABS 0.0 07/20/2017 1315   . CMP Latest Ref Rng & Units 01/02/2019 11/29/2018 11/22/2018  Glucose 70 - 99 mg/dL 115(H) 84 77  BUN 6 - 20 mg/dL 6 9 12   Creatinine 0.44 - 1.00 mg/dL 0.73 0.81 0.70  Sodium 135 - 145 mmol/L 138 144 140  Potassium 3.5 - 5.1 mmol/L 4.5 4.9 4.3  Chloride 98 - 111 mmol/L 101 107 105  CO2 22 - 32 mmol/L 28 24 26   Calcium 8.9 - 10.3 mg/dL 9.1 9.4 9.1  Total Protein 6.5 - 8.1 g/dL 7.6 7.5 7.4  Total Bilirubin 0.3 - 1.2 mg/dL 0.5 0.3 0.6  Alkaline Phos 38 - 126 U/L 114 179(H) 167(H)  AST 15 - 41 U/L 35 17 16  ALT 0 - 44 U/L 19 12 18      RADIOGRAPHIC STUDIES: I have personally reviewed the radiological images as listed and agreed with the findings in the report. Nm Pet Image Restag (ps) Skull Base To Thigh  Result Date: 12/06/2018 CLINICAL DATA:  Subsequent treatment strategy for Hodgkin's lymphoma. EXAM: NUCLEAR MEDICINE PET SKULL BASE TO THIGH TECHNIQUE: 6.4 mCi F-18 FDG was injected intravenously. Full-ring PET imaging was performed from the skull base to thigh after the radiotracer. CT data was obtained and used for attenuation correction and anatomic localization. Fasting blood glucose: 78 mg/dl COMPARISON:  Multiple exams, including 09/26/2018 FINDINGS: Mediastinal blood pool activity: SUV max 1.6 Background hepatic activity SUV max 2.6 NECK: Accentuated metabolic activity along the right palatine tonsillar region with maximum SUV 7.7, similar area on the left has a maximum SUV of 5.1. Likely physiologic and symmetric activity in tongue without CT  correlate. Symmetric salivary gland activity. No pathologically enlarged or hypermetabolic adenopathy in the neck identified. Incidental CT findings: None CHEST: The previous left lateral supraclavicular lymph node is no longer seen. Certainly there is a configuration ule difference because the patient was scanned with upper arm by her left side today but overhead previously, which can drastically changed the positioning such a lesion relative to scanning. However, no hypermetabolic focus is visualized in  the vicinity of the left pectoral/deltoid groove where the lesion previously resided and accordingly this is now considered Deauville 0. Interval clearing of most of the left lung airspace opacities with a small amount of residual confluent opacity in the lingula, and some minimal residual nodularity. Improved airspace opacities on the right continued dense right perihilar consolidation particularly extending in the right lower lobe. Peripherally the right lower lobe airspace opacities have a maximum SUV of 7.9 (formerly 14.2) and the right upper lobe airspace opacity has a maximum SUV of 5.1 (formerly 9.6). In the left lung the patchy residual opacities have a maximum SUV of 3.0, and formerly the lingula of the maximum SUV was 9.6. The remaining pulmonary opacities are assessed as Deauville 5 on the right and Deauville 4 on the left. Incidental CT findings: Left Port-A-Cath tip: Cavoatrial junction. Notable volume loss in right hemithorax. Low-density blood pool. Pectus excavatum. ABDOMEN/PELVIS: Notable reduction in the retroperitoneal and left pelvic nodal hypermetabolic activity shown on the prior exam. A left periaortic node measuring 1.7 cm in short axis on image 125/4 (formerly 1.9 cm) has a maximum SUV of 3.8 (which is Deauville 4), formerly 10.4. An index left external iliac node measures 1.2 cm in short axis on image 162/4 (formerly 1.4 cm) with maximum SUV 3.3 (which is Deauville 4), formerly 7.6.  Similar reduction in retroperitoneal and other left pelvic lymph nodes observed. Hypermetabolic activity in the sigmoid colon and rectum diffusely without obvious wall thickening, probably physiologic. Background splenic activity about 3.4, no splenomegaly. No focal splenic lesions observed. A new small immediately subcutaneous 0.6 by 0.3 cm nodule along the left posterior junction of the buttock and thigh on image 168/4 has maximum SUV of 5.5 (Deauville 4) and is probably a small sebaceous or inflammatory lesion. Incidental CT findings: Prior ascites has resolved. SKELETON: Previous hypermetabolic lesions in the skeleton are mostly improved compared to prior in indistinct focus of accentuated activity in the left iliac bone has a maximum SUV of 3.6 (Deauville 4), formerly 5.4. Left proximal femur lesion maximum SUV 3.3 (Deauville 4), formerly 4.0. Incidental CT findings: none IMPRESSION: 1. Generally improved appearance with previously mostly Deauville 5 activity now mostly Deauville 4 activity. 2. Layering of much of the airspace opacity in the lungs, although considerable right perihilar airspace opacity remains. The remaining pulmonary opacities are assess this Deauville 5 on the right and over L4 on the left, and were previously Deauville 5 bilaterally. 3. Mildly reduced size and moderately reduced activity in the abdominopelvic lymph nodes which are now dove L4. 4. The previously seen left supraclavicular lymph node seems to have completely resolved (Deauville 0). 5. The skeletal lesions remain at Deauville 4, although in absolute terms have decreased in SUV compared to previous. 6. No new regions of malignant involvement compared to prior exam. 7. Other imaging findings of potential clinical significance: Low-density blood pool suggests anemia. Pectus excavatum. Volume loss in the right hemithorax. Electronically Signed   By: Van Clines M.D.   On: 12/06/2018 11:24    ASSESSMENT & PLAN:   33 y.o.  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 She was noted to have significant constitutional symptoms including significant weight loss, fevers chills and some night sweats. HIV negative Hepatitis C and hepatitis B serologies negative. Echo with normal ejection fraction. Patient was treated with 5 cycles of AVD (Bleomycin held due to  poor DLCO 37% and ongoing smoking). Multiple avoidable treatment delays due to the patient's noncompliance with follow-up for avoidable reasons. She has been counseled repeatedly that this would increase the likelihood of unfavorable outcome.  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 S/p 6 cycles of 2nd line treatment with Bendamustine/Brentuximab And 1 cycle of Bretuximab alone  -PET/CT scan results from 04/14/2017 were discussed in details. She appears to have some persistent disease in her right lung at Deauville 5. It is difficult to say if this is recurrent or persistent disease since the patient failed to follow-up on multiple scheduled PET/CT scans in the early part and prior to her second line treatment.  Patient was lost to followup for >1 yr  09/26/18 PET/CT revealed Imaging findings compatible with recurrence of disease. 2. Multiple new large areas of hypermetabolic nodularity and airspace consolidation within both lungs which is presumed to represent pulmonary involvement by lymphoma. Deauville criteria 5. 3. New hypermetabolic left supraclavicular, left retroperitoneal, and bilateral pelvic lymph nodes. Deauville criteria 5. 4. Multifocal hypermetabolic osseous lesions. Deauville criteria 5. 5. Small volume of ascites, new.   #2 s/p hypoxic respiratory failurewith dense right lung consolidation and left upper lobe  consolidation with SVC syndrome. Patient has completed palliative radiation to the right lung mass causing SVC compression.  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  #4 Non compliancewith clinic and treatment followup. Missed 2nd dose of bendamustine with C2. Has missed multiple appointment for her PET/CT and missed appointment at [facility] for consideration of Transplant.  Pt was lost to follow up after July 2018 and returned on 08/31/18 Discussed the patient's goals of care and the pt noted that she is ready to begin treatment again and maintain compliance and follow ups   #5 h/o Grade 1 neuropathyfrom Brentuximab-Vedotin - resolved  #6 Shingles outbreak - curentlyresolved but with significant post herpetic neuralgia  #7 Significant anemia and thrombocytopenia after C1 of ICE -- will need close monitoring.  #8 DVT Prophylaxis - lovenox  PLAN: -Discussed pt labwork today, 01/02/19; HGB improving to 11.7, PLT normal at 195k, ANC at 1.3k -Discussed the 12/06/18 PET/CT which revealed Generally improved appearance with previously mostly Deauville 5 activity now mostly Deauville 4 activity. 2. Layering of much of the airspace opacity in the lungs, although considerable right perihilar airspace opacity remains. The remaining pulmonary opacities are assess this Deauville 5 on the right and over L4 on the left, and were previously Deauville 5 bilaterally. 3. Mildly reduced size and moderately reduced activity in the abdominopelvic lymph nodes which are now dove L4. 4. The previously seen left supraclavicular lymph node seems to have completely resolved (Deauville 0). 5. The skeletal lesions remain at Deauville 4, although in absolute terms have decreased in SUV compared to previous. 6. No new regions of malignant  involvement compared to prior exam. 7. Other imaging findings of potential clinical significance: Low-density blood pool suggests anemia. Pectus excavatum. Volume loss in the right hemithorax. -Possible to consider CAR T-Cell Therapy at [facility] -Will begin Pembrolizumab, tentatively in one week, and discussed the potential side effects and intent of treatment  -Will repeat PET/CT in 3-4 months  -Will refer pt to pain management for consideration of nerve block -Did discuss Dr. Jolayne Haines' recommendation with pt to repeat biopsy, and pt does not wish to repeat  this at this time   -Advised strict neutropenic preventative strategies, and crowd avoidance, though expecting Neulasta support given 7 days ago to increase counts -continueGabapentin,Amitryptiline andPrn percocet for PHN. Referral to interventional pain mx for intercostal nerve block for Post herpetic neuralgia -Continue Acyclovirfor shingles prophylaxis -Will see the pt back in 1 week    Nivolumab to start in 1 week q2weeks RTC with dr Irene Limbo with labs with C2 for toxicity check Labs with port flush with each treatment Referral to interventional pain mx for intercostal nerve block for Post herpetic neuralgia   The total time spent in the appt was 30 minutes and more than 50% was on counseling and direct patient cares.    Sullivan Lone MD [place] AAHIVMS [facility] Hematology/Oncology Physician [facility]  (Office):       [phone] (Work cell):  [phone] (Fax):           [phone]  I, Baldwin [place], am acting as a scribe for Dr. Sullivan Lone.   .I have reviewed the above documentation for accuracy and completeness, and I agree with the above. Brunetta Genera MD

## 2019-01-02 ENCOUNTER — Inpatient Hospital Stay: Payer: Medicaid Other | Attending: Hematology | Admitting: Hematology

## 2019-01-02 ENCOUNTER — Telehealth: Payer: Self-pay | Admitting: Hematology

## 2019-01-02 ENCOUNTER — Inpatient Hospital Stay: Payer: Medicaid Other

## 2019-01-02 VITALS — BP 114/81 | HR 90 | Temp 97.8°F | Resp 18 | Ht 67.0 in | Wt 133.2 lb

## 2019-01-02 DIAGNOSIS — Z79899 Other long term (current) drug therapy: Secondary | ICD-10-CM

## 2019-01-02 DIAGNOSIS — Z9119 Patient's noncompliance with other medical treatment and regimen: Secondary | ICD-10-CM | POA: Diagnosis not present

## 2019-01-02 DIAGNOSIS — D649 Anemia, unspecified: Secondary | ICD-10-CM | POA: Insufficient documentation

## 2019-01-02 DIAGNOSIS — B0229 Other postherpetic nervous system involvement: Secondary | ICD-10-CM | POA: Insufficient documentation

## 2019-01-02 DIAGNOSIS — Z9114 Patient's other noncompliance with medication regimen: Secondary | ICD-10-CM | POA: Diagnosis not present

## 2019-01-02 DIAGNOSIS — R21 Rash and other nonspecific skin eruption: Secondary | ICD-10-CM | POA: Diagnosis not present

## 2019-01-02 DIAGNOSIS — Z87891 Personal history of nicotine dependence: Secondary | ICD-10-CM | POA: Insufficient documentation

## 2019-01-02 DIAGNOSIS — Z5112 Encounter for antineoplastic immunotherapy: Secondary | ICD-10-CM | POA: Diagnosis present

## 2019-01-02 DIAGNOSIS — C8128 Mixed cellularity classical Hodgkin lymphoma, lymph nodes of multiple sites: Secondary | ICD-10-CM

## 2019-01-02 LAB — CBC WITH DIFFERENTIAL/PLATELET
Abs Immature Granulocytes: 0 10*3/uL (ref 0.00–0.07)
BASOS ABS: 0 10*3/uL (ref 0.0–0.1)
Basophils Relative: 0 %
EOS ABS: 0.1 10*3/uL (ref 0.0–0.5)
EOS PCT: 3 %
HEMATOCRIT: 36.4 % (ref 36.0–46.0)
HEMOGLOBIN: 11.7 g/dL — AB (ref 12.0–15.0)
Immature Granulocytes: 0 %
LYMPHS ABS: 0.3 10*3/uL — AB (ref 0.7–4.0)
Lymphocytes Relative: 15 %
MCH: 31.1 pg (ref 26.0–34.0)
MCHC: 32.1 g/dL (ref 30.0–36.0)
MCV: 96.8 fL (ref 80.0–100.0)
Monocytes Absolute: 0.3 10*3/uL (ref 0.1–1.0)
Monocytes Relative: 16 %
NRBC: 0 % (ref 0.0–0.2)
Neutro Abs: 1.3 10*3/uL — ABNORMAL LOW (ref 1.7–7.7)
Neutrophils Relative %: 66 %
Platelets: 195 10*3/uL (ref 150–400)
RBC: 3.76 MIL/uL — AB (ref 3.87–5.11)
RDW: 17.5 % — AB (ref 11.5–15.5)
WBC: 1.9 10*3/uL — AB (ref 4.0–10.5)

## 2019-01-02 LAB — CMP (CANCER CENTER ONLY)
ALBUMIN: 3.8 g/dL (ref 3.5–5.0)
ALK PHOS: 114 U/L (ref 38–126)
ALT: 19 U/L (ref 0–44)
ANION GAP: 9 (ref 5–15)
AST: 35 U/L (ref 15–41)
BILIRUBIN TOTAL: 0.5 mg/dL (ref 0.3–1.2)
BUN: 6 mg/dL (ref 6–20)
CALCIUM: 9.1 mg/dL (ref 8.9–10.3)
CO2: 28 mmol/L (ref 22–32)
Chloride: 101 mmol/L (ref 98–111)
Creatinine: 0.73 mg/dL (ref 0.44–1.00)
GFR, Est AFR Am: >60 mL/min (ref >60)
GLUCOSE: 115 mg/dL — AB (ref 70–99)
POTASSIUM: 4.5 mmol/L (ref 3.5–5.1)
Sodium: 138 mmol/L (ref 135–145)
TOTAL PROTEIN: 7.6 g/dL (ref 6.5–8.1)

## 2019-01-02 LAB — SAMPLE TO BLOOD BANK

## 2019-01-02 MED ORDER — OXYCODONE-ACETAMINOPHEN 5-325 MG PO TABS: 1.0000 | ORAL_TABLET | Freq: Four times a day (QID) | ORAL | 0 refills | Status: DC | PRN

## 2019-01-02 NOTE — Telephone Encounter (Signed)
Printed calendar and avs. °

## 2019-01-06 NOTE — Progress Notes (Signed)
DISCONTINUE OFF PATHWAY REGIMEN - Lymphoma and CLL   [id number]:ICE (CIV Ifosfamide) q21 Days (Inpatient):   A cycle is every 21 days:     Etoposide      Carboplatin      Ifosfamide      [place]      [id number]   **Always confirm dose/schedule in your pharmacy ordering system**  REASON: Disease Progression PRIOR TREATMENT: Off Pathway: ICE (CIV Ifosfamide) q21 Days (Inpatient) TREATMENT RESPONSE: Partial Response (PR)  START ON PATHWAY REGIMEN - Lymphoma and CLL     A cycle is 21 days:     Pembrolizumab   **Always confirm dose/schedule in your pharmacy ordering system**  Patient Characteristics: Classical Hodgkin Lymphoma, Fourth Line and Beyond Disease Type: Not Applicable Disease Type: Not Applicable Disease Type: Classical Hodgkin Lymphoma Line of therapy: Fourth Line and Beyond [place] Stage: IVBE Intent of Therapy: Curative Intent, Discussed with Patient

## 2019-01-09 ENCOUNTER — Inpatient Hospital Stay: Payer: Medicaid Other

## 2019-01-09 ENCOUNTER — Other Ambulatory Visit: Payer: Self-pay | Admitting: Hematology

## 2019-01-09 VITALS — BP 111/79 | HR 79 | Temp 98.4°F | Resp 16

## 2019-01-09 DIAGNOSIS — C8128 Mixed cellularity classical Hodgkin lymphoma, lymph nodes of multiple sites: Secondary | ICD-10-CM

## 2019-01-09 DIAGNOSIS — Z95828 Presence of other vascular implants and grafts: Secondary | ICD-10-CM

## 2019-01-09 DIAGNOSIS — Z7189 Other specified counseling: Secondary | ICD-10-CM

## 2019-01-09 DIAGNOSIS — C8198 Hodgkin lymphoma, unspecified, lymph nodes of multiple sites: Secondary | ICD-10-CM

## 2019-01-09 LAB — CMP (CANCER CENTER ONLY)
ALK PHOS: 105 U/L (ref 38–126)
ALT: 10 U/L (ref 0–44)
AST: 13 U/L — ABNORMAL LOW (ref 15–41)
Albumin: 3.4 g/dL — ABNORMAL LOW (ref 3.5–5.0)
Anion gap: 8 (ref 5–15)
BUN: 6 mg/dL (ref 6–20)
CALCIUM: 8.9 mg/dL (ref 8.9–10.3)
CO2: 27 mmol/L (ref 22–32)
Chloride: 105 mmol/L (ref 98–111)
Creatinine: 0.73 mg/dL (ref 0.44–1.00)
GFR, Est AFR Am: >60 mL/min (ref >60)
Glucose, Bld: 118 mg/dL — ABNORMAL HIGH (ref 70–99)
Potassium: 3.7 mmol/L (ref 3.5–5.1)
Sodium: 140 mmol/L (ref 135–145)
TOTAL PROTEIN: 6.6 g/dL (ref 6.5–8.1)
Total Bilirubin: 0.4 mg/dL (ref 0.3–1.2)

## 2019-01-09 LAB — CBC WITH DIFFERENTIAL/PLATELET
Abs Immature Granulocytes: 0.01 10*3/uL (ref 0.00–0.07)
Basophils Absolute: 0 10*3/uL (ref 0.0–0.1)
Basophils Relative: 0 %
Eosinophils Absolute: 0.2 10*3/uL (ref 0.0–0.5)
Eosinophils Relative: 7 %
HCT: 31.1 % — ABNORMAL LOW (ref 36.0–46.0)
Hemoglobin: 10.2 g/dL — ABNORMAL LOW (ref 12.0–15.0)
Immature Granulocytes: 0 %
Lymphocytes Relative: 6 %
Lymphs Abs: 0.2 10*3/uL — ABNORMAL LOW (ref 0.7–4.0)
MCH: 31.1 pg (ref 26.0–34.0)
MCHC: 32.8 g/dL (ref 30.0–36.0)
MCV: 94.8 fL (ref 80.0–100.0)
MONO ABS: 0.6 10*3/uL (ref 0.1–1.0)
Monocytes Relative: 18 %
Neutro Abs: 2.4 10*3/uL (ref 1.7–7.7)
Neutrophils Relative %: 69 %
Platelets: 285 10*3/uL (ref 150–400)
RBC: 3.28 MIL/uL — ABNORMAL LOW (ref 3.87–5.11)
RDW: 16.1 % — ABNORMAL HIGH (ref 11.5–15.5)
WBC: 3.5 10*3/uL — ABNORMAL LOW (ref 4.0–10.5)
nRBC: 0 % (ref 0.0–0.2)

## 2019-01-09 LAB — TSH: TSH: 0.774 u[IU]/mL (ref 0.308–3.960)

## 2019-01-09 MED ORDER — SODIUM CHLORIDE 0.9% FLUSH
10.0000 mL | INTRAVENOUS | Status: DC | PRN
  Filled 2019-01-09: qty 10

## 2019-01-09 MED ORDER — DIPHENHYDRAMINE HCL 25 MG PO TABS
25.0000 mg | ORAL_TABLET | Freq: Once | ORAL | Status: AC
  Administered 2019-01-09: 25 mg via ORAL
  Filled 2019-01-09: qty 1

## 2019-01-09 MED ORDER — ACETAMINOPHEN 325 MG PO TABS
650.0000 mg | ORAL_TABLET | Freq: Once | ORAL | Status: AC
  Administered 2019-01-09: 650 mg via ORAL

## 2019-01-09 MED ORDER — SODIUM CHLORIDE 0.9 % IV SOLN
200.0000 mg | Freq: Once | INTRAVENOUS | Status: AC
  Administered 2019-01-09: 200 mg via INTRAVENOUS
  Filled 2019-01-09: qty 8

## 2019-01-09 MED ORDER — HEPARIN SOD (PORK) LOCK FLUSH 100 UNIT/ML IV SOLN
500.0000 [IU] | Freq: Once | INTRAVENOUS | Status: AC | PRN
  Administered 2019-01-09: 500 [IU]
  Filled 2019-01-09: qty 5

## 2019-01-09 MED ORDER — ACETAMINOPHEN 325 MG PO TABS
ORAL_TABLET | ORAL | Status: AC
  Filled 2019-01-09: qty 2

## 2019-01-09 MED ORDER — SODIUM CHLORIDE 0.9% FLUSH
10.0000 mL | INTRAVENOUS | Status: DC | PRN
  Administered 2019-01-09 (×2): 10 mL
  Filled 2019-01-09: qty 10

## 2019-01-09 MED ORDER — FAMOTIDINE 20 MG PO TABS
40.0000 mg | ORAL_TABLET | Freq: Once | ORAL | Status: AC
  Administered 2019-01-09: 40 mg via ORAL

## 2019-01-09 MED ORDER — SODIUM CHLORIDE 0.9 % IV SOLN
Freq: Once | INTRAVENOUS | Status: AC
  Administered 2019-01-09: 09:00:00 via INTRAVENOUS
  Filled 2019-01-09: qty 250

## 2019-01-09 MED ORDER — DIPHENHYDRAMINE HCL 25 MG PO CAPS
ORAL_CAPSULE | ORAL | Status: AC
  Filled 2019-01-09: qty 1

## 2019-01-09 MED ORDER — FAMOTIDINE 20 MG PO TABS
ORAL_TABLET | ORAL | Status: AC
  Filled 2019-01-09: qty 2

## 2019-01-09 NOTE — Patient Instructions (Signed)
[facility] Discharge Instructions for Patients Receiving Chemotherapy  Today you received the following chemotherapy agents: Keytruda (Pembrolizumab)  To help prevent nausea and vomiting after your treatment, we encourage you to take your nausea medication as directed.   If you develop nausea and vomiting that is not controlled by your nausea medication, call the clinic.   BELOW ARE SYMPTOMS THAT SHOULD BE REPORTED IMMEDIATELY:  *FEVER GREATER THAN 100.5 F  *CHILLS WITH OR WITHOUT FEVER  NAUSEA AND VOMITING THAT IS NOT CONTROLLED WITH YOUR NAUSEA MEDICATION  *UNUSUAL SHORTNESS OF BREATH  *UNUSUAL BRUISING OR BLEEDING  TENDERNESS IN MOUTH AND THROAT WITH OR WITHOUT PRESENCE OF ULCERS  *URINARY PROBLEMS  *BOWEL PROBLEMS  UNUSUAL RASH Items with * indicate a potential emergency and should be followed up as soon as possible.  Feel free to call the clinic should you have any questions or concerns. The clinic phone number is [phone].  Please show the [facility] at check-in to the Emergency Department and triage nurse.  Pembrolizumab injection What is this medicine? PEMBROLIZUMAB (pem broe liz ue mab) is a monoclonal antibody. It is used to treat cervical cancer, esophageal cancer, head and neck cancer, hepatocellular cancer, Hodgkin lymphoma, kidney cancer, lymphoma, melanoma, Merkel cell carcinoma, lung cancer, stomach cancer, urothelial cancer, and cancers that have a certain genetic condition. This medicine may be used for other purposes; ask your health care provider or pharmacist if you have questions. COMMON BRAND NAME(S): Keytruda What should I tell my health care provider before I take this medicine? They need to know if you have any of these conditions: -diabetes -immune system problems -inflammatory bowel disease -liver disease -lung or breathing disease -lupus -received or scheduled to receive an organ transplant or a stem-cell  transplant that uses donor stem cells -an unusual or allergic reaction to pembrolizumab, other medicines, foods, dyes, or preservatives -pregnant or trying to get pregnant -breast-feeding How should I use this medicine? This medicine is for infusion into a vein. It is given by a health care professional in a hospital or clinic setting. A special MedGuide will be given to you before each treatment. Be sure to read this information carefully each time. Talk to your pediatrician regarding the use of this medicine in children. While this drug may be prescribed for selected conditions, precautions do apply. Overdosage: If you think you have taken too much of this medicine contact a poison control center or emergency room at once. NOTE: This medicine is only for you. Do not share this medicine with others. What if I miss a dose? It is important not to miss your dose. Call your doctor or health care professional if you are unable to keep an appointment. What may interact with this medicine? Interactions have not been studied. Give your health care provider a list of all the medicines, herbs, non-prescription drugs, or dietary supplements you use. Also tell them if you smoke, drink alcohol, or use illegal drugs. Some items may interact with your medicine. This list may not describe all possible interactions. Give your health care provider a list of all the medicines, herbs, non-prescription drugs, or dietary supplements you use. Also tell them if you smoke, drink alcohol, or use illegal drugs. Some items may interact with your medicine. What should I watch for while using this medicine? Your condition will be monitored carefully while you are receiving this medicine. You may need blood work done while you are taking this medicine. Do not become pregnant  while taking this medicine or for 4 months after stopping it. Women should inform their doctor if they wish to become pregnant or think they might be  pregnant. There is a potential for serious side effects to an unborn child. Talk to your health care professional or pharmacist for more information. Do not breast-feed an infant while taking this medicine or for 4 months after the last dose. What side effects may I notice from receiving this medicine? Side effects that you should report to your doctor or health care professional as soon as possible: -allergic reactions like skin rash, itching or hives, swelling of the face, lips, or tongue -bloody or black, tarry -breathing problems -changes in vision -chest pain -chills -confusion -constipation -cough -diarrhea -dizziness or feeling faint or lightheaded -fast or irregular heartbeat -fever -flushing -hair loss -joint pain -low blood counts - this medicine may decrease the number of white blood cells, red blood cells and platelets. You may be at increased risk for infections and bleeding. -muscle pain -muscle weakness -persistent headache -redness, blistering, peeling or loosening of the skin, including inside the mouth -signs and symptoms of high blood sugar such as dizziness; dry mouth; dry skin; fruity breath; nausea; stomach pain; increased hunger or thirst; increased urination -signs and symptoms of kidney injury like trouble passing urine or change in the amount of urine -signs and symptoms of liver injury like dark urine, light-colored stools, loss of appetite, nausea, right upper belly pain, yellowing of the eyes or skin -sweating -swollen lymph nodes -weight loss Side effects that usually do not require medical attention (report to your doctor or health care professional if they continue or are bothersome): -decreased appetite -muscle pain -tiredness This list may not describe all possible side effects. Call your doctor for medical advice about side effects. You may report side effects to FDA at 1-800-FDA-1088. Where should I keep my medicine? This drug is given in a  hospital or clinic and will not be stored at home. NOTE: This sheet is a summary. It may not cover all possible information. If you have questions about this medicine, talk to your doctor, pharmacist, or health care provider.  2019 Elsevier/Gold Standard (2018-07-27 15:06:10)

## 2019-01-09 NOTE — Progress Notes (Signed)
Verified with Dr. Irene Limbo that patient is to get pembrolizumab. Patient's appointments will be updated to Q3 week schedule.  Demetrius Charity, PharmD, [facility] Pharmacist Pharmacy Phone: [phone] 01/09/2019

## 2019-01-15 ENCOUNTER — Encounter: Payer: Self-pay | Admitting: Physical Medicine & Rehabilitation

## 2019-01-22 ENCOUNTER — Telehealth: Payer: Self-pay | Admitting: *Deleted

## 2019-01-22 NOTE — Telephone Encounter (Signed)
Per pharmacist note and MD verification,pembrolizumab is every 3 weeks (q 21 days). Next scheduled admin is 01/30/2019. Sent scheduling message to check treatment plan 01/06/2019 with treatments every 21 days and schedule next infusion for 2/4. Current appts based on earlier treatment. Contacted patient to let her know that her infusion is not scheduled for tomorrow, just lab and MD. Patient will be contacted by scheduling. Patient verbalized understanding.

## 2019-01-22 NOTE — Progress Notes (Signed)
Heidi [name]  HEMATOLOGY ONCOLOGY PROGRESS NOTE  Date of service:   01/23/19     Patient Care Team: Clinic, General Medical as PCP - General (Family Medicine)  Chief complaint: Follow-up for Hodgkin's lymphoma  Diagnosis:   Refractory Mixed cellularity Hodgkin's lymphoma IVBE with extensive lymphadenopathy including right axillary, mediastinal and upper retroperitoneal and now biopsy proven pulmonary involvement. She was noted to have significant constitutional symptoms including significant weight loss, fevers chills and some night sweats.  Current Treatment: Third line therapy ICE for 2 planned cycles.  She was lost to f/u for >85yr  Previous treatment 5 cycles of AVD (without bleomycin due to lung involvement and DLCO of 37%, active smoker) Multiple avoidable treatment delays due to the patient's noncompliance with follow-up for avoidable reasons. She has been counseled repeatedly that this would increase the likelihood of unfavorable outcome.  2nd line therapy with Bendamustine + Brentuximab s/p 7 cycles.  3rd line therapy with ICE s/p 2 cycles   INTERVAL HISTORY:  Heidi [name] is here for follow-up for her Hodgkins lymphoma for C2D1 Pembrolizumab treatment. The patient's last visit with [place] was on 01/02/19. The pt reports that she is doing well overall .   The pt reports that she will be having an appointment on February 7 for her nerve block procedure. She is continuing to have post-herpetic neuralgia pain. The pt notes that she has stable breathing, and notes some SOB after walking up a flight of stairs. She denies skin rashes, leg swelling, mouth sores, fevers, chills, chest pain, concerns for infections. She also notes that she has gained some weight and has been eating much better.   Lab results today (01/23/19) of CBC w/diff is as follows: all values are WNL except for WBC at 3.2k, RBC at 3.68, HGB at 11.7, HCT at 35.7, RDW at 15.9, Lymphs abs at 400. 01/23/19 CMP is pending  On review  of systems, pt reports stable breathing, eating well, weight gain, moving her bowels well, and denies fevers, chills, night sweats, skin rashes, leg swelling, mouth sores, chest pain, concerns for infections, back pains, and any other symptoms.   REVIEW OF SYSTEMS:    A 10+ POINT REVIEW OF SYSTEMS WAS OBTAINED including neurology, dermatology, psychiatry, cardiac, respiratory, lymph, extremities, GI, GU, Musculoskeletal, constitutional, breasts, reproductive, HEENT.  All pertinent positives are noted in the HPI.  All others are negative.  . Past Medical History:  Diagnosis Date  . ARDS (adult respiratory distress syndrome) ([place])   . Asthma   . Hodgkin lymphoma ([place])   . Hypertension     . Past Surgical History:  Procedure Laterality Date  . AXILLARY LYMPH NODE BIOPSY Right 03/19/2016   Procedure: AXILLARY LYMPH NODE BIOPSY;  Surgeon: Armandina Gemma, MD;  Location: [facility];  Service: General;  Laterality: Right;  . IR GENERIC HISTORICAL  12/22/2016   IR FLUORO GUIDE PORT INSERTION LEFT 12/22/2016 [facility]  . IR GENERIC HISTORICAL  12/22/2016   IR US GUIDE VASC ACCESS LEFT 12/22/2016 [facility]  . IR GENERIC HISTORICAL  12/22/2016   IR CV LINE INJECTION 12/22/2016 [facility]  . IR GENERIC HISTORICAL  12/22/2016   IR REMOVAL TUN ACCESS W/ PORT W/O FL MOD SED 12/22/2016 [facility]  . VIDEO BRONCHOSCOPY Bilateral 11/26/2016   Procedure: VIDEO BRONCHOSCOPY WITH FLUORO;  Surgeon: Rigoberto Noel, MD;  Location: [facility];  Service: Cardiopulmonary;  Laterality: Bilateral;    . Social History   Tobacco Use  . Smoking status: Former Smoker  Packs/day: 0.50    Years: 15.00    Pack years: 7.50    Types: Cigarettes    Last attempt to quit: 03/27/2016    Years since quitting: 2.8  . Smokeless tobacco: Never Used  Substance Use Topics  . Alcohol use: Yes    Comment: occasional now  . Drug use: Not on file    ALLERGIES:  has No Known Allergies.  MEDICATIONS:  Current  Outpatient Medications  Medication Sig Dispense Refill  . acyclovir (ZOVIRAX) 400 MG tablet Take 1 tablet (400 mg total) by mouth 2 (two) times daily. Start after completion of Valtrex 60 tablet 6  . amitriptyline (ELAVIL) 10 MG tablet Take 1 tablet (10 mg total) by mouth at bedtime. (Patient not taking: Reported on 11/14/2018) 20 tablet 0  . gabapentin (NEURONTIN) 300 MG capsule Take 1 capsule (300 mg total) by mouth 2 (two) times daily. 60 capsule 0  . oxyCODONE-acetaminophen (PERCOCET/ROXICET) 5-325 MG tablet Take 1-2 tablets by mouth every 6 (six) hours as needed for severe pain. 50 tablet 0   No current facility-administered medications for this visit.    Facility-Administered Medications Ordered in Other Visits  Medication Dose Route Frequency Provider Last Rate Last Dose  . sodium chloride flush (NS) 0.9 % injection 10 mL  10 mL Intracatheter PRN Brunetta Genera, MD        PHYSICAL EXAMINATION: ECOG PERFORMANCE STATUS: 1 - Symptomatic but completely ambulatory  Vitals:   01/23/19 1138  BP: 129/77  Pulse: 80  Resp: 18  Temp: 98.3 F (36.8 C)  SpO2: 96%    Filed Weights   01/23/19 1138  Weight: 137 lb 14.4 oz (62.6 kg)   .Body mass index is 21.6 kg/m.   GENERAL:alert, in no acute distress and comfortable SKIN: no acute rashes, no significant lesions EYES: conjunctiva are pink and non-injected, sclera anicteric OROPHARYNX: MMM, no exudates, no oropharyngeal erythema or ulceration NECK: supple, no JVD LYMPH:  no palpable lymphadenopathy in the cervical, axillary or inguinal regions LUNGS: b/l scattered rhonchi, no rales  HEART: regular rate & rhythm ABDOMEN:  normoactive bowel sounds , non tender, not distended. No palpable hepatosplenomegaly.  Extremity: no pedal edema PSYCH: alert & oriented x 3 with fluent speech NEURO: no focal motor/sensory deficits    LABORATORY DATA:   I have reviewed the data as listed  . CBC Latest Ref Rng & Units 01/23/2019  01/09/2019 01/02/2019  WBC 4.0 - 10.5 K/uL 3.2(L) 3.5(L) 1.9(L)  Hemoglobin 12.0 - 15.0 g/dL 11.7(L) 10.2(L) 11.7(L)  Hematocrit 36.0 - 46.0 % 35.7(L) 31.1(L) 36.4  Platelets 150 - 400 K/uL 240 285 195   . CBC    Component Value Date/Time   WBC 3.2 (L) 01/23/2019 1114   RBC 3.68 (L) 01/23/2019 1114   HGB 11.7 (L) 01/23/2019 1114   HGB 10.6 (L) 11/07/2018 1047   HGB 11.7 07/20/2017 1315   HCT 35.7 (L) 01/23/2019 1114   HCT 35.2 07/20/2017 1315   PLT 240 01/23/2019 1114   PLT 473 (H) 11/07/2018 1047   PLT 266 07/20/2017 1315   MCV 97.0 01/23/2019 1114   MCV 93.2 07/20/2017 1315   MCH 31.8 01/23/2019 1114   MCHC 32.8 01/23/2019 1114   RDW 15.9 (H) 01/23/2019 1114   RDW 14.5 07/20/2017 1315   LYMPHSABS 0.4 (L) 01/23/2019 1114   LYMPHSABS 0.3 (L) 07/20/2017 1315   MONOABS 0.5 01/23/2019 1114   MONOABS 0.5 07/20/2017 1315   EOSABS 0.3 01/23/2019 1114   EOSABS 0.2  07/20/2017 1315   BASOSABS 0.0 01/23/2019 1114   BASOSABS 0.0 07/20/2017 1315   . CMP Latest Ref Rng & Units 01/23/2019 01/09/2019 01/02/2019  Glucose 70 - 99 mg/dL 84 118(H) 115(H)  BUN 6 - 20 mg/dL 13 6 6   Creatinine 0.44 - 1.00 mg/dL 0.86 0.73 0.73  Sodium 135 - 145 mmol/L 136 140 138  Potassium 3.5 - 5.1 mmol/L 5.0 3.7 4.5  Chloride 98 - 111 mmol/L 102 105 101  CO2 22 - 32 mmol/L 26 27 28   Calcium 8.9 - 10.3 mg/dL 9.4 8.9 9.1  Total Protein 6.5 - 8.1 g/dL 8.0 6.6 7.6  Total Bilirubin 0.3 - 1.2 mg/dL 0.6 0.4 0.5  Alkaline Phos 38 - 126 U/L 116 105 114  AST 15 - 41 U/L 16 13(L) 35  ALT 0 - 44 U/L 9 10 19      RADIOGRAPHIC STUDIES: I have personally reviewed the radiological images as listed and agreed with the findings in the report. No results found.  ASSESSMENT & PLAN:   33 y.o. African-American female with  #1 Refractory/Progressive Mixed cellularity Hodgkin's lymphoma IV BEwith extensive lymphadenopathy including right axillary, mediastinal and upper retroperitoneal and now biopsy proven pulmonary  involvement. She was noted to have significant constitutional symptoms including significant weight loss, fevers chills and some night sweats. HIV negative Hepatitis C and hepatitis B serologies negative. Echo with normal ejection fraction. Patient was treated with 5 cycles of AVD (Bleomycin held due to poor DLCO 37% and ongoing smoking). Multiple avoidable treatment delays due to the patient's noncompliance with follow-up for avoidable reasons. She has been counseled repeatedly that this would increase the likelihood of unfavorable outcome.  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 S/p 6 cycles of 2nd line treatment with Bendamustine/Brentuximab And 1 cycle of Bretuximab alone  -PET/CT scan results from 04/14/2017 were discussed in details. She appears to have some persistent disease in her right lung at Deauville 5. It is difficult to say if this is recurrent or persistent disease since the patient failed to follow-up on multiple scheduled PET/CT scans in the early part and prior to her second line treatment.  Patient was lost to followup for >1 yr  09/26/18 PET/CT revealed Imaging findings compatible with recurrence of disease. 2. Multiple new large areas of hypermetabolic nodularity and airspace consolidation within both lungs which is presumed to represent pulmonary involvement by lymphoma. Deauville criteria 5. 3. New hypermetabolic left supraclavicular, left retroperitoneal, and bilateral pelvic lymph nodes. Deauville criteria 5. 4. Multifocal hypermetabolic osseous lesions. Deauville criteria 5. 5. Small volume of ascites, new.   12/06/18 PET/CT revealed Generally improved appearance with previously mostly Deauville 5 activity now mostly Deauville 4 activity. 2. Layering of much of the airspace opacity in the lungs, although considerable right perihilar airspace opacity remains. The remaining pulmonary opacities are assess this Deauville 5 on the right and  over L4 on the left, and were previously Deauville 5 bilaterally. 3. Mildly reduced size and moderately reduced activity in the abdominopelvic lymph nodes which are now dove L4. 4. The previously seen left supraclavicular lymph node seems to have completely resolved (Deauville 0). 5. The skeletal lesions remain at Deauville 4, although in absolute terms have decreased in SUV compared to previous. 6. No new regions of malignant involvement compared to prior exam. 7. Other imaging findings of potential clinical significance: Low-density blood pool suggests anemia. Pectus excavatum. Volume loss in the right hemithorax.  #2 s/p hypoxic respiratory failurewith dense right  lung consolidation and left upper lobe consolidation with SVC syndrome. Patient has completed palliative radiation to the right lung mass causing SVC compression.  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  #4 Non compliancewith clinic and treatment followup. Missed 2nd dose of bendamustine with C2. Has missed multiple appointment for her PET/CT and missed appointment at [facility] for consideration of Transplant.  Pt was lost to follow up after July 2018 and returned on 08/31/18 Discussed the patient's goals of care and the pt noted that she is ready to begin treatment again and maintain compliance and follow ups   #5 h/o Grade 1 neuropathyfrom Brentuximab-Vedotin - resolved  #6 Shingles outbreak - curentlyresolved but with significant post herpetic neuralgia  #7 Significant anemia and thrombocytopenia after C1 of ICE -- will need close monitoring.  #8 DVT Prophylaxis - lovenox  PLAN: -Discussed pt labwork today, 01/23/19; HGB improved to 11.7, ANC at 2.0k, other blood counts are stable -The pt has no prohibitive toxicities from continuing C2  Pembrolizumab at this time. -Will repeat PET/CT in 3-4 months from January 2020 -Continue Gabapentin,Amitryptiline andPrn percocet for PHN. Referral to interventional pain mx for intercostal nerve block for Post herpetic neuralgia -Continue Acyclovirfor shingles prophylaxis -Will see the pt back in 3 weeks    Please schedule next 2 cycles of Pembrolizumab as ordered q3weeks with labs and MD visit   The total time spent in the appt was 25 minutes and more than 50% was on counseling and direct patient cares.    Sullivan Lone MD [place] AAHIVMS [facility] Hematology/Oncology Physician [facility]  (Office):       [phone] (Work cell):  [phone] (Fax):           [phone]  I, Baldwin [place], am acting as a scribe for Dr. Sullivan Lone.   .I have reviewed the above documentation for accuracy and completeness, and I agree with the above. Brunetta Genera MD

## 2019-01-23 ENCOUNTER — Inpatient Hospital Stay: Payer: Medicaid Other

## 2019-01-23 ENCOUNTER — Inpatient Hospital Stay (HOSPITAL_BASED_OUTPATIENT_CLINIC_OR_DEPARTMENT_OTHER): Payer: Medicaid Other | Admitting: Hematology

## 2019-01-23 ENCOUNTER — Ambulatory Visit: Payer: Medicaid Other

## 2019-01-23 VITALS — BP 129/77 | HR 80 | Temp 98.3°F | Resp 18 | Ht 67.0 in | Wt 137.9 lb

## 2019-01-23 DIAGNOSIS — B0229 Other postherpetic nervous system involvement: Secondary | ICD-10-CM

## 2019-01-23 DIAGNOSIS — C8128 Mixed cellularity classical Hodgkin lymphoma, lymph nodes of multiple sites: Secondary | ICD-10-CM

## 2019-01-23 DIAGNOSIS — D649 Anemia, unspecified: Secondary | ICD-10-CM | POA: Diagnosis not present

## 2019-01-23 DIAGNOSIS — C8198 Hodgkin lymphoma, unspecified, lymph nodes of multiple sites: Secondary | ICD-10-CM

## 2019-01-23 DIAGNOSIS — Z5112 Encounter for antineoplastic immunotherapy: Secondary | ICD-10-CM

## 2019-01-23 DIAGNOSIS — Z95828 Presence of other vascular implants and grafts: Secondary | ICD-10-CM

## 2019-01-23 DIAGNOSIS — Z79899 Other long term (current) drug therapy: Secondary | ICD-10-CM

## 2019-01-23 DIAGNOSIS — Z87891 Personal history of nicotine dependence: Secondary | ICD-10-CM

## 2019-01-23 DIAGNOSIS — Z9114 Patient's other noncompliance with medication regimen: Secondary | ICD-10-CM

## 2019-01-23 DIAGNOSIS — R21 Rash and other nonspecific skin eruption: Secondary | ICD-10-CM

## 2019-01-23 DIAGNOSIS — Z7189 Other specified counseling: Secondary | ICD-10-CM

## 2019-01-23 DIAGNOSIS — Z9119 Patient's noncompliance with other medical treatment and regimen: Secondary | ICD-10-CM

## 2019-01-23 LAB — CMP (CANCER CENTER ONLY)
ALT: 9 U/L (ref 0–44)
AST: 16 U/L (ref 15–41)
Albumin: 4 g/dL (ref 3.5–5.0)
Alkaline Phosphatase: 116 U/L (ref 38–126)
Anion gap: 8 (ref 5–15)
BILIRUBIN TOTAL: 0.6 mg/dL (ref 0.3–1.2)
BUN: 13 mg/dL (ref 6–20)
CO2: 26 mmol/L (ref 22–32)
Calcium: 9.4 mg/dL (ref 8.9–10.3)
Chloride: 102 mmol/L (ref 98–111)
Creatinine: 0.86 mg/dL (ref 0.44–1.00)
GFR, Est AFR Am: >60 mL/min (ref >60)
GFR, Estimated: >60 mL/min (ref >60)
Glucose, Bld: 84 mg/dL (ref 70–99)
Potassium: 5 mmol/L (ref 3.5–5.1)
Sodium: 136 mmol/L (ref 135–145)
Total Protein: 8 g/dL (ref 6.5–8.1)

## 2019-01-23 LAB — CBC WITH DIFFERENTIAL/PLATELET
Abs Immature Granulocytes: 0 10*3/uL (ref 0.00–0.07)
BASOS ABS: 0 10*3/uL (ref 0.0–0.1)
Basophils Relative: 0 %
Eosinophils Absolute: 0.3 10*3/uL (ref 0.0–0.5)
Eosinophils Relative: 9 %
HCT: 35.7 % — ABNORMAL LOW (ref 36.0–46.0)
Hemoglobin: 11.7 g/dL — ABNORMAL LOW (ref 12.0–15.0)
Immature Granulocytes: 0 %
Lymphocytes Relative: 13 %
Lymphs Abs: 0.4 10*3/uL — ABNORMAL LOW (ref 0.7–4.0)
MCH: 31.8 pg (ref 26.0–34.0)
MCHC: 32.8 g/dL (ref 30.0–36.0)
MCV: 97 fL (ref 80.0–100.0)
Monocytes Absolute: 0.5 10*3/uL (ref 0.1–1.0)
Monocytes Relative: 15 %
Neutro Abs: 2 10*3/uL (ref 1.7–7.7)
Neutrophils Relative %: 63 %
Platelets: 240 10*3/uL (ref 150–400)
RBC: 3.68 MIL/uL — AB (ref 3.87–5.11)
RDW: 15.9 % — ABNORMAL HIGH (ref 11.5–15.5)
WBC: 3.2 10*3/uL — ABNORMAL LOW (ref 4.0–10.5)
nRBC: 0 % (ref 0.0–0.2)

## 2019-01-23 MED ORDER — SODIUM CHLORIDE 0.9% FLUSH
10.0000 mL | INTRAVENOUS | Status: AC | PRN
  Filled 2019-01-23: qty 10

## 2019-01-25 ENCOUNTER — Telehealth: Payer: Self-pay

## 2019-01-25 NOTE — Telephone Encounter (Signed)
Spoke with patient concerning her additional appointments that was added to her current schedule. Per 1/18 los She will pick up a copy on her next visit

## 2019-02-06 ENCOUNTER — Inpatient Hospital Stay: Payer: Medicaid Other

## 2019-02-06 ENCOUNTER — Inpatient Hospital Stay: Payer: Medicaid Other | Attending: Hematology

## 2019-02-06 VITALS — BP 122/71 | HR 68 | Temp 98.1°F | Resp 18 | Wt 139.2 lb

## 2019-02-06 DIAGNOSIS — C8198 Hodgkin lymphoma, unspecified, lymph nodes of multiple sites: Secondary | ICD-10-CM

## 2019-02-06 DIAGNOSIS — Z95828 Presence of other vascular implants and grafts: Secondary | ICD-10-CM

## 2019-02-06 DIAGNOSIS — Z5112 Encounter for antineoplastic immunotherapy: Secondary | ICD-10-CM | POA: Diagnosis present

## 2019-02-06 DIAGNOSIS — Z23 Encounter for immunization: Secondary | ICD-10-CM | POA: Insufficient documentation

## 2019-02-06 DIAGNOSIS — C8128 Mixed cellularity classical Hodgkin lymphoma, lymph nodes of multiple sites: Secondary | ICD-10-CM | POA: Insufficient documentation

## 2019-02-06 DIAGNOSIS — Z7189 Other specified counseling: Secondary | ICD-10-CM

## 2019-02-06 LAB — CBC WITH DIFFERENTIAL/PLATELET
Abs Immature Granulocytes: 0.01 10*3/uL (ref 0.00–0.07)
BASOS PCT: 0 %
Basophils Absolute: 0 10*3/uL (ref 0.0–0.1)
Eosinophils Absolute: 0.4 10*3/uL (ref 0.0–0.5)
Eosinophils Relative: 12 %
HCT: 31.2 % — ABNORMAL LOW (ref 36.0–46.0)
Hemoglobin: 10.4 g/dL — ABNORMAL LOW (ref 12.0–15.0)
Immature Granulocytes: 0 %
Lymphocytes Relative: 13 %
Lymphs Abs: 0.4 10*3/uL — ABNORMAL LOW (ref 0.7–4.0)
MCH: 32.2 pg (ref 26.0–34.0)
MCHC: 33.3 g/dL (ref 30.0–36.0)
MCV: 96.6 fL (ref 80.0–100.0)
Monocytes Absolute: 0.6 10*3/uL (ref 0.1–1.0)
Monocytes Relative: 18 %
Neutro Abs: 1.8 10*3/uL (ref 1.7–7.7)
Neutrophils Relative %: 57 %
Platelets: 238 10*3/uL (ref 150–400)
RBC: 3.23 MIL/uL — ABNORMAL LOW (ref 3.87–5.11)
RDW: 14.8 % (ref 11.5–15.5)
WBC: 3.2 10*3/uL — ABNORMAL LOW (ref 4.0–10.5)
nRBC: 0 % (ref 0.0–0.2)

## 2019-02-06 LAB — CMP (CANCER CENTER ONLY)
ALK PHOS: 118 U/L (ref 38–126)
ALT: 9 U/L (ref 0–44)
ANION GAP: 8 (ref 5–15)
AST: 16 U/L (ref 15–41)
Albumin: 3.7 g/dL (ref 3.5–5.0)
BUN: 8 mg/dL (ref 6–20)
CALCIUM: 8.9 mg/dL (ref 8.9–10.3)
CO2: 27 mmol/L (ref 22–32)
Chloride: 105 mmol/L (ref 98–111)
Creatinine: 0.8 mg/dL (ref 0.44–1.00)
GFR, Est AFR Am: >60 mL/min (ref >60)
GFR, Estimated: >60 mL/min (ref >60)
Glucose, Bld: 71 mg/dL (ref 70–99)
Potassium: 4.5 mmol/L (ref 3.5–5.1)
Sodium: 140 mmol/L (ref 135–145)
Total Bilirubin: 0.4 mg/dL (ref 0.3–1.2)
Total Protein: 7.1 g/dL (ref 6.5–8.1)

## 2019-02-06 LAB — SEDIMENTATION RATE: Sed Rate: 20 mm/hr (ref 0–22)

## 2019-02-06 MED ORDER — DIPHENHYDRAMINE HCL 25 MG PO TABS
25.0000 mg | ORAL_TABLET | Freq: Once | ORAL | Status: AC
  Administered 2019-02-06: 25 mg via ORAL
  Filled 2019-02-06: qty 1

## 2019-02-06 MED ORDER — SODIUM CHLORIDE 0.9% FLUSH
10.0000 mL | INTRAVENOUS | Status: DC | PRN
  Administered 2019-02-06: 10 mL
  Filled 2019-02-06: qty 10

## 2019-02-06 MED ORDER — INFLUENZA VAC SPLIT QUAD 0.5 ML IM SUSY
PREFILLED_SYRINGE | INTRAMUSCULAR | Status: AC
  Filled 2019-02-06: qty 0.5

## 2019-02-06 MED ORDER — HEPARIN SOD (PORK) LOCK FLUSH 100 UNIT/ML IV SOLN
500.0000 [IU] | Freq: Once | INTRAVENOUS | Status: AC | PRN
  Administered 2019-02-06: 500 [IU]
  Filled 2019-02-06: qty 5

## 2019-02-06 MED ORDER — SODIUM CHLORIDE 0.9 % IV SOLN
200.0000 mg | Freq: Once | INTRAVENOUS | Status: AC
  Administered 2019-02-06: 200 mg via INTRAVENOUS
  Filled 2019-02-06: qty 8

## 2019-02-06 MED ORDER — ACETAMINOPHEN 325 MG PO TABS
ORAL_TABLET | ORAL | Status: AC
  Filled 2019-02-06: qty 2

## 2019-02-06 MED ORDER — ACETAMINOPHEN 325 MG PO TABS
650.0000 mg | ORAL_TABLET | Freq: Once | ORAL | Status: AC
  Administered 2019-02-06: 650 mg via ORAL

## 2019-02-06 MED ORDER — SODIUM CHLORIDE 0.9 % IV SOLN
Freq: Once | INTRAVENOUS | Status: AC
  Administered 2019-02-06: 09:00:00 via INTRAVENOUS
  Filled 2019-02-06: qty 250

## 2019-02-06 MED ORDER — FAMOTIDINE 20 MG PO TABS
40.0000 mg | ORAL_TABLET | Freq: Once | ORAL | Status: AC
  Administered 2019-02-06: 40 mg via ORAL

## 2019-02-06 MED ORDER — DIPHENHYDRAMINE HCL 25 MG PO CAPS
ORAL_CAPSULE | ORAL | Status: AC
  Filled 2019-02-06: qty 1

## 2019-02-06 MED ORDER — FAMOTIDINE 20 MG PO TABS
ORAL_TABLET | ORAL | Status: AC
  Filled 2019-02-06: qty 2

## 2019-02-06 MED ORDER — INFLUENZA VAC SPLIT QUAD 0.5 ML IM SUSY
0.5000 mL | PREFILLED_SYRINGE | Freq: Once | INTRAMUSCULAR | Status: AC
  Administered 2019-02-06: 0.5 mL via INTRAMUSCULAR

## 2019-02-06 NOTE — Patient Instructions (Addendum)
[place] Discharge Instructions for Patients Receiving Chemotherapy  Today you received the following chemotherapy agents: Keytruda (Pembrolizumab)  To help prevent nausea and vomiting after your treatment, we encourage you to take your nausea medication as directed.   If you develop nausea and vomiting that is not controlled by your nausea medication, call the clinic.   BELOW ARE SYMPTOMS THAT SHOULD BE REPORTED IMMEDIATELY:  *FEVER GREATER THAN 100.5 F  *CHILLS WITH OR WITHOUT FEVER  NAUSEA AND VOMITING THAT IS NOT CONTROLLED WITH YOUR NAUSEA MEDICATION  *UNUSUAL SHORTNESS OF BREATH  *UNUSUAL BRUISING OR BLEEDING  TENDERNESS IN MOUTH AND THROAT WITH OR WITHOUT PRESENCE OF ULCERS  *URINARY PROBLEMS  *BOWEL PROBLEMS  UNUSUAL RASH Items with * indicate a potential emergency and should be followed up as soon as possible.  Feel free to call the clinic should you have any questions or concerns. The clinic phone number is [phone].  Please show the [place] at check-in to the Emergency Department and triage nurse.    Influenza Virus Vaccine (Flucelvax) What is this medicine? INFLUENZA VIRUS VACCINE (in floo EN zuh VAHY ruhs vak SEEN) helps to reduce the risk of getting influenza also known as the flu. The vaccine only helps protect you against some strains of the flu. This medicine may be used for other purposes; ask your health care provider or pharmacist if you have questions. COMMON BRAND NAME(S): FLUCELVAX What should I tell my health care provider before I take this medicine? They need to know if you have any of these conditions: -bleeding disorder like hemophilia -fever or infection -Guillain-Barre syndrome or other neurological problems -immune system problems -infection with the human immunodeficiency virus (HIV) or AIDS -low blood platelet counts -multiple sclerosis -an unusual or allergic reaction to influenza virus vaccine, other  medicines, foods, dyes or preservatives -pregnant or trying to get pregnant -breast-feeding How should I use this medicine? This vaccine is for injection into a muscle. It is given by a health care professional. A copy of Vaccine Information Statements will be given before each vaccination. Read this sheet carefully each time. The sheet may change frequently. Talk to your pediatrician regarding the use of this medicine in children. Special care may be needed. Overdosage: If you think you've taken too much of this medicine contact a poison control center or emergency room at once. Overdosage: If you think you have taken too much of this medicine contact a poison control center or emergency room at once. NOTE: This medicine is only for you. Do not share this medicine with others. What if I miss a dose? This does not apply. What may interact with this medicine? -chemotherapy or radiation therapy -medicines that lower your immune system like etanercept, anakinra, infliximab, and adalimumab -medicines that treat or prevent blood clots like warfarin -phenytoin -steroid medicines like prednisone or cortisone -theophylline -vaccines This list may not describe all possible interactions. Give your health care provider a list of all the medicines, herbs, non-prescription drugs, or dietary supplements you use. Also tell them if you smoke, drink alcohol, or use illegal drugs. Some items may interact with your medicine. What should I watch for while using this medicine? Report any side effects that do not go away within 3 days to your doctor or health care professional. Call your health care provider if any unusual symptoms occur within 6 weeks of receiving this vaccine. You may still catch the flu, but the illness is not usually as bad. You  cannot get the flu from the vaccine. The vaccine will not protect against colds or other illnesses that may cause fever. The vaccine is needed every year. What side  effects may I notice from receiving this medicine? Side effects that you should report to your doctor or health care professional as soon as possible: -allergic reactions like skin rash, itching or hives, swelling of the face, lips, or tongue Side effects that usually do not require medical attention (Report these to your doctor or health care professional if they continue or are bothersome.): -fever -headache -muscle aches and pains -pain, tenderness, redness, or swelling at the injection site -tiredness This list may not describe all possible side effects. Call your doctor for medical advice about side effects. You may report side effects to FDA at 1-800-FDA-1088. Where should I keep my medicine? The vaccine will be given by a health care professional in a clinic, pharmacy, doctor's office, or other health care setting. You will not be given vaccine doses to store at home. NOTE: This sheet is a summary. It may not cover all possible information. If you have questions about this medicine, talk to your doctor, pharmacist, or health care provider.  2019 Elsevier/Gold Standard (2011-11-24 14:06:47)

## 2019-02-07 ENCOUNTER — Encounter: Payer: Medicaid Other | Admitting: Physical Medicine & Rehabilitation

## 2019-02-20 ENCOUNTER — Other Ambulatory Visit: Payer: Medicaid Other

## 2019-02-20 ENCOUNTER — Ambulatory Visit: Payer: Medicaid Other

## 2019-02-20 ENCOUNTER — Ambulatory Visit: Payer: Medicaid Other | Admitting: Hematology

## 2019-02-21 ENCOUNTER — Encounter: Payer: Medicaid Other | Admitting: Physical Medicine & Rehabilitation

## 2019-02-23 NOTE — Progress Notes (Signed)
Heidi [name]  HEMATOLOGY ONCOLOGY PROGRESS NOTE  Date of service:   02/27/19     Patient Care Team: Clinic, General Medical as PCP - General (Family Medicine)  Chief complaint: Follow-up for Hodgkin's lymphoma  Diagnosis:   Refractory Mixed cellularity Hodgkin's lymphoma IVBE with extensive lymphadenopathy including right axillary, mediastinal and upper retroperitoneal and now biopsy proven pulmonary involvement. She was noted to have significant constitutional symptoms including significant weight loss, fevers chills and some night sweats.  Current Treatment: Third line therapy ICE for 2 planned cycles.  She was lost to f/u for >18yr  Previous treatment 5 cycles of AVD (without bleomycin due to lung involvement and DLCO of 37%, active smoker) Multiple avoidable treatment delays due to the patient's noncompliance with follow-up for avoidable reasons. She has been counseled repeatedly that this would increase the likelihood of unfavorable outcome.  2nd line therapy with Bendamustine + Brentuximab s/p 7 cycles.  3rd line therapy with ICE s/p 2 cycles   INTERVAL HISTORY:  [name] is here for follow-up for her Hodgkins lymphoma for C3 Pembrolizumab treatment. The patient's last visit with Heidi [name] was on 01/23/19. The pt reports that she is doing well overall.   The pt reports that she has not felt SOB while walking around and denies changes in her breathing. She notes that she is enjoying good energy levels, has been eating well, and has gained weight in the interim. She has continued to tolerate Pembrolizumab well and denies new skin rashes or diarrhea.  Lab results today (02/27/19) of CBC w/diff and CMP is as follows: all values are WNL except for WBC at 3.0k, RBC at 3.26, HGB at 10.3, HCT at 31.7, Lymphs abs at 300, Calcium at 8.4. 02/27/19 Sed Rate is at 14 02/27/19 TSH is at 1.445  On review of systems, pt reports good energy levels, eating well, weight gain, and denies diarrhea, abdominal pains,  new skin rashes, noticing any new lumps or bumps, abdominal pains, and any other symptoms.  REVIEW OF SYSTEMS:    A 10+ POINT REVIEW OF SYSTEMS WAS OBTAINED including neurology, dermatology, psychiatry, cardiac, respiratory, lymph, extremities, GI, GU, Musculoskeletal, constitutional, breasts, reproductive, HEENT.  All pertinent positives are noted in the HPI.  All others are negative.  . Past Medical History:  Diagnosis Date  . ARDS (adult respiratory distress syndrome) ([place])   . Asthma   . Hodgkin lymphoma ([place])   . Hypertension     . Past Surgical History:  Procedure Laterality Date  . AXILLARY LYMPH NODE BIOPSY Right 03/19/2016   Procedure: AXILLARY LYMPH NODE BIOPSY;  Surgeon: Armandina Gemma, MD;  Location: [facility];  Service: General;  Laterality: Right;  . IR GENERIC HISTORICAL  12/22/2016   IR FLUORO GUIDE PORT INSERTION LEFT 12/22/2016 [facility]  . IR GENERIC HISTORICAL  12/22/2016   IR [facility]idi [name] GUIDE VASC ACCESS LEFT 12/22/2016 [facility]  . IR GENERIC HISTORICAL  12/22/2016   IR CV LINE INJECTION 12/22/2016 [facility]  . IR GENERIC HISTORICAL  12/22/2016   IR REMOVAL TUN ACCESS W/ PORT W/O FL MOD SED 12/22/2016 [facility]  . VIDEO BRONCHOSCOPY Bilateral 11/26/2016   Procedure: VIDEO BRONCHOSCOPY WITH FLUORO;  Surgeon: Rigoberto Noel, MD;  Location: [facility];  Service: Cardiopulmonary;  Laterality: Bilateral;    . Social History   Tobacco Use  . Smoking status: Former Smoker    Packs/day: 0.50    Years: 15.00    Pack years: 7.50    Types: Cigarettes  Last attempt to quit: 03/27/2016    Years since quitting: 2.9  . Smokeless tobacco: Never Used  Substance Use Topics  . Alcohol use: Yes    Comment: occasional now  . Drug use: Not on file    ALLERGIES:  has No Known Allergies.  MEDICATIONS:  Current Outpatient Medications  Medication Sig Dispense Refill  . acyclovir (ZOVIRAX) 400 MG tablet Take 1 tablet (400 mg total) by mouth 2 (two) times daily. Start  after completion of Valtrex 60 tablet 6  . amitriptyline (ELAVIL) 10 MG tablet Take 1 tablet (10 mg total) by mouth at bedtime. (Patient not taking: Reported on 11/14/2018) 20 tablet 0  . gabapentin (NEURONTIN) 300 MG capsule Take 1 capsule (300 mg total) by mouth 2 (two) times daily. 60 capsule 0  . oxyCODONE-acetaminophen (PERCOCET/ROXICET) 5-325 MG tablet Take 1-2 tablets by mouth every 6 (six) hours as needed for severe pain. 50 tablet 0   No current facility-administered medications for this visit.    Facility-Administered Medications Ordered in Other Visits  Medication Dose Route Frequency Provider Last Rate Last Dose  . sodium chloride flush (NS) 0.9 % injection 10 mL  10 mL Intracatheter PRN Brunetta Genera, MD        PHYSICAL EXAMINATION: ECOG PERFORMANCE STATUS: 1 - Symptomatic but completely ambulatory  Vitals:   02/27/19 1255  BP: 128/79  Pulse: 90  Resp: 18  Temp: 98.8 F (37.1 C)  SpO2: 100%    Filed Weights   02/27/19 1255  Weight: 148 lb 4.8 oz (67.3 kg)   .Body mass index is 23.23 kg/m.   GENERAL:alert, in no acute distress and comfortable SKIN: no acute rashes, no significant lesions EYES: conjunctiva are pink and non-injected, sclera anicteric OROPHARYNX: MMM, no exudates, no oropharyngeal erythema or ulceration NECK: supple, no JVD LYMPH:  no palpable lymphadenopathy in the cervical, axillary or inguinal regions LUNGS: b/l scattered rhonchi, no rales  HEART: regular rate & rhythm ABDOMEN:  normoactive bowel sounds , non tender, not distended. No palpable hepatosplenomegaly.  Extremity: no pedal edema PSYCH: alert & oriented x 3 with fluent speech NEURO: no focal motor/sensory deficits   LABORATORY DATA:   I have reviewed the data as listed  . CBC Latest Ref Rng & Units 02/27/2019 02/06/2019 01/23/2019  WBC 4.0 - 10.5 K/uL 3.0(L) 3.2(L) 3.2(L)  Hemoglobin 12.0 - 15.0 g/dL 10.3(L) 10.4(L) 11.7(L)  Hematocrit 36.0 - 46.0 % 31.7(L) 31.2(L)  35.7(L)  Platelets 150 - 400 K/uL 242 238 240   . CBC    Component Value Date/Time   WBC 3.0 (L) 02/27/2019 1136   RBC 3.26 (L) 02/27/2019 1136   HGB 10.3 (L) 02/27/2019 1136   HGB 10.6 (L) 11/07/2018 1047   HGB 11.7 07/20/2017 1315   HCT 31.7 (L) 02/27/2019 1136   HCT 35.2 07/20/2017 1315   PLT 242 02/27/2019 1136   PLT 473 (H) 11/07/2018 1047   PLT 266 07/20/2017 1315   MCV 97.2 02/27/2019 1136   MCV 93.2 07/20/2017 1315   MCH 31.6 02/27/2019 1136   MCHC 32.5 02/27/2019 1136   RDW 13.4 02/27/2019 1136   RDW 14.5 07/20/2017 1315   LYMPHSABS 0.3 (L) 02/27/2019 1136   LYMPHSABS 0.3 (L) 07/20/2017 1315   MONOABS 0.5 02/27/2019 1136   MONOABS 0.5 07/20/2017 1315   EOSABS 0.4 02/27/2019 1136   EOSABS 0.2 07/20/2017 1315   BASOSABS 0.0 02/27/2019 1136   BASOSABS 0.0 07/20/2017 1315   . CMP Latest Ref Rng & Units  02/27/2019 02/06/2019 01/23/2019  Glucose 70 - 99 mg/dL 83 71 84  BUN 6 - 20 mg/dL 14 8 13   Creatinine 0.44 - 1.00 mg/dL 0.92 0.80 0.86  Sodium 135 - 145 mmol/L 138 140 136  Potassium 3.5 - 5.1 mmol/L 4.2 4.5 5.0  Chloride 98 - 111 mmol/L 105 105 102  CO2 22 - 32 mmol/L 26 27 26   Calcium 8.9 - 10.3 mg/dL 8.4(L) 8.9 9.4  Total Protein 6.5 - 8.1 g/dL 7.1 7.1 8.0  Total Bilirubin 0.3 - 1.2 mg/dL 0.4 0.4 0.6  Alkaline Phos 38 - 126 U/L 120 118 116  AST 15 - 41 U/L 19 16 16   ALT 0 - 44 U/L 9 9 9      RADIOGRAPHIC STUDIES: I have personally reviewed the radiological images as listed and agreed with the findings in the report. No results found.  ASSESSMENT & PLAN:   33 y.o. 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 She was noted to have significant constitutional symptoms including significant weight loss, fevers chills and some night sweats. HIV negative Hepatitis C and  hepatitis B serologies negative. Echo with normal ejection fraction. Patient was treated with 5 cycles of AVD (Bleomycin held due to poor DLCO 37% and ongoing smoking). Multiple avoidable treatment delays due to the patient's noncompliance with follow-up for avoidable reasons. She has been counseled repeatedly that this would increase the likelihood of unfavorable outcome.  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 S/p 6 cycles of 2nd line treatment with Bendamustine/Brentuximab And 1 cycle of Bretuximab alone  -PET/CT scan results from 04/14/2017 were discussed in details. She appears to have some persistent disease in her right lung at Deauville 5. It is difficult to say if this is recurrent or persistent disease since the patient failed to follow-up on multiple scheduled PET/CT scans in the early part and prior to her second line treatment.  Patient was lost to followup for >1 yr  09/26/18 PET/CT revealed Imaging findings compatible with recurrence of disease. 2. Multiple new large areas of hypermetabolic nodularity and airspace consolidation within both lungs which is presumed to represent pulmonary involvement by lymphoma. Deauville criteria 5. 3. New hypermetabolic left supraclavicular, left retroperitoneal, and bilateral pelvic lymph nodes. Deauville criteria 5. 4. Multifocal hypermetabolic osseous lesions. Deauville criteria 5. 5. Small volume of ascites, new.   12/06/18 PET/CT revealed Generally improved appearance with previously mostly Deauville 5 activity now mostly Deauville 4 activity. 2. Layering of much of the airspace opacity in the lungs, although considerable right perihilar airspace opacity remains. The remaining pulmonary opacities are assess this Deauville 5 on the right and over L4 on the left, and were previously Deauville 5 bilaterally. 3. Mildly reduced size and moderately reduced activity in the abdominopelvic lymph nodes which are now dove  L4. 4. The previously seen left supraclavicular lymph node seems to have completely resolved (Deauville 0). 5. The skeletal lesions remain at Deauville 4, although in absolute terms have decreased in SUV compared to previous. 6. No new regions of malignant involvement compared to prior exam. 7. Other imaging findings of potential clinical significance: Low-density blood pool suggests anemia. Pectus excavatum. Volume loss in the right hemithorax.  #2 s/p hypoxic respiratory failurewith dense right lung consolidation and left upper lobe consolidation with SVC syndrome. Patient has completed palliative radiation to the right lung mass causing SVC compression.  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  #4 Non compliancewith clinic and treatment followup. Missed 2nd dose of bendamustine with C2. Has missed multiple appointment for her PET/CT and missed appointment at [facility] for [facility] of Transplant.  Pt was lost to follow up after July 2018 and returned on 08/31/18 Discussed the patient's goals of care and the pt noted that she is ready to begin treatment again and maintain compliance and follow ups   #5 h/o Grade 1 neuropathyfrom Brentuximab-Vedotin - resolved  #6 Shingles outbreak - curentlyresolved but with significant post herpetic neuralgia  #7 Significant anemia and thrombocytopenia after C1 of ICE -- will need close monitoring.  #8 DVT Prophylaxis - lovenox  PLAN: -Discussed pt labwork today, 02/27/19; blood counts and chemistries are stable. Sed rate at 14. TSH normal at 1.445 -The pt has no prohibitive toxicities from continuing C3 Pembrolizumab at this time.   -Will repeat PET/CT after completing 4-5 cycles of pembrolizumab -Continue Gabapentin,Amitryptiline andPrn percocet for PHN. -Referral  to interventional pain mx for intercostal nerve block for Post herpetic neuralgia, scheduled for 03/26/19 -Continue Acyclovirfor shingles prophylaxis -Will see the pt back in 3 weeks    F/u with next treatment with labs and MD appointment as scheduled on 3/24   . The total time spent in the appointment was 20 minutes and more than 50% was on counseling and direct patient cares.      Sullivan Lone MD [place] AAHIVMS [facility] Hematology/Oncology Physician [facility]  (Office):       [phone] (Work cell):  [phone] (Fax):           [phone]  I, Baldwin [place], am acting as a scribe for Dr. Sullivan Lone.   .I have reviewed the above documentation for accuracy and completeness, and I agree with the above. Brunetta Genera MD

## 2019-02-27 ENCOUNTER — Inpatient Hospital Stay: Payer: Medicaid Other

## 2019-02-27 ENCOUNTER — Inpatient Hospital Stay: Payer: Medicaid Other | Attending: Hematology | Admitting: Hematology

## 2019-02-27 VITALS — BP 128/79 | HR 90 | Temp 98.8°F | Resp 18 | Ht 67.0 in | Wt 148.3 lb

## 2019-02-27 DIAGNOSIS — Z9119 Patient's noncompliance with other medical treatment and regimen: Secondary | ICD-10-CM | POA: Insufficient documentation

## 2019-02-27 DIAGNOSIS — C8128 Mixed cellularity classical Hodgkin lymphoma, lymph nodes of multiple sites: Secondary | ICD-10-CM | POA: Diagnosis present

## 2019-02-27 DIAGNOSIS — B0229 Other postherpetic nervous system involvement: Secondary | ICD-10-CM | POA: Diagnosis not present

## 2019-02-27 DIAGNOSIS — Z87891 Personal history of nicotine dependence: Secondary | ICD-10-CM | POA: Insufficient documentation

## 2019-02-27 DIAGNOSIS — Z5112 Encounter for antineoplastic immunotherapy: Secondary | ICD-10-CM | POA: Insufficient documentation

## 2019-02-27 DIAGNOSIS — Z95828 Presence of other vascular implants and grafts: Secondary | ICD-10-CM

## 2019-02-27 DIAGNOSIS — Z7189 Other specified counseling: Secondary | ICD-10-CM

## 2019-02-27 DIAGNOSIS — Z9114 Patient's other noncompliance with medication regimen: Secondary | ICD-10-CM | POA: Insufficient documentation

## 2019-02-27 DIAGNOSIS — C8198 Hodgkin lymphoma, unspecified, lymph nodes of multiple sites: Secondary | ICD-10-CM

## 2019-02-27 DIAGNOSIS — D649 Anemia, unspecified: Secondary | ICD-10-CM | POA: Diagnosis not present

## 2019-02-27 DIAGNOSIS — Z79899 Other long term (current) drug therapy: Secondary | ICD-10-CM | POA: Insufficient documentation

## 2019-02-27 LAB — TSH: TSH: 1.445 u[IU]/mL (ref 0.308–3.960)

## 2019-02-27 LAB — CMP (CANCER CENTER ONLY)
ALT: 9 U/L (ref 0–44)
AST: 19 U/L (ref 15–41)
Albumin: 3.7 g/dL (ref 3.5–5.0)
Alkaline Phosphatase: 120 U/L (ref 38–126)
Anion gap: 7 (ref 5–15)
BUN: 14 mg/dL (ref 6–20)
CO2: 26 mmol/L (ref 22–32)
Calcium: 8.4 mg/dL — ABNORMAL LOW (ref 8.9–10.3)
Chloride: 105 mmol/L (ref 98–111)
Creatinine: 0.92 mg/dL (ref 0.44–1.00)
GFR, Est AFR Am: >60 mL/min (ref >60)
GFR, Estimated: >60 mL/min (ref >60)
Glucose, Bld: 83 mg/dL (ref 70–99)
Potassium: 4.2 mmol/L (ref 3.5–5.1)
SODIUM: 138 mmol/L (ref 135–145)
Total Bilirubin: 0.4 mg/dL (ref 0.3–1.2)
Total Protein: 7.1 g/dL (ref 6.5–8.1)

## 2019-02-27 LAB — CBC WITH DIFFERENTIAL/PLATELET
Abs Immature Granulocytes: 0 10*3/uL (ref 0.00–0.07)
Basophils Absolute: 0 10*3/uL (ref 0.0–0.1)
Basophils Relative: 0 %
Eosinophils Absolute: 0.4 10*3/uL (ref 0.0–0.5)
Eosinophils Relative: 13 %
HCT: 31.7 % — ABNORMAL LOW (ref 36.0–46.0)
Hemoglobin: 10.3 g/dL — ABNORMAL LOW (ref 12.0–15.0)
Immature Granulocytes: 0 %
Lymphocytes Relative: 11 %
Lymphs Abs: 0.3 10*3/uL — ABNORMAL LOW (ref 0.7–4.0)
MCH: 31.6 pg (ref 26.0–34.0)
MCHC: 32.5 g/dL (ref 30.0–36.0)
MCV: 97.2 fL (ref 80.0–100.0)
Monocytes Absolute: 0.5 10*3/uL (ref 0.1–1.0)
Monocytes Relative: 15 %
Neutro Abs: 1.8 10*3/uL (ref 1.7–7.7)
Neutrophils Relative %: 61 %
Platelets: 242 10*3/uL (ref 150–400)
RBC: 3.26 MIL/uL — ABNORMAL LOW (ref 3.87–5.11)
RDW: 13.4 % (ref 11.5–15.5)
WBC: 3 10*3/uL — AB (ref 4.0–10.5)
nRBC: 0 % (ref 0.0–0.2)

## 2019-02-27 LAB — SEDIMENTATION RATE: Sed Rate: 14 mm/hr (ref 0–22)

## 2019-02-27 MED ORDER — HEPARIN SOD (PORK) LOCK FLUSH 100 UNIT/ML IV SOLN
500.0000 [IU] | Freq: Once | INTRAVENOUS | Status: AC | PRN
  Administered 2019-02-27: 500 [IU]
  Filled 2019-02-27: qty 5

## 2019-02-27 MED ORDER — SODIUM CHLORIDE 0.9% FLUSH
10.0000 mL | INTRAVENOUS | Status: DC | PRN
  Administered 2019-02-27: 10 mL
  Filled 2019-02-27: qty 10

## 2019-02-27 NOTE — Progress Notes (Signed)
Patient to infusion at 13:20. Per patient she has to be gone from [facility] by 14:00 to pick her son up from the bus stop. Patient declined to stay for infusion today. Dr. Grier Mitts nurse Carlyon Prows made aware and will work on rescheduling patient.

## 2019-02-28 ENCOUNTER — Inpatient Hospital Stay: Payer: Medicaid Other

## 2019-02-28 VITALS — BP 121/73 | HR 82 | Temp 98.3°F | Resp 16

## 2019-02-28 DIAGNOSIS — Z5112 Encounter for antineoplastic immunotherapy: Secondary | ICD-10-CM | POA: Diagnosis not present

## 2019-02-28 DIAGNOSIS — Z7189 Other specified counseling: Secondary | ICD-10-CM

## 2019-02-28 DIAGNOSIS — C8198 Hodgkin lymphoma, unspecified, lymph nodes of multiple sites: Secondary | ICD-10-CM

## 2019-02-28 MED ORDER — HEPARIN SOD (PORK) LOCK FLUSH 100 UNIT/ML IV SOLN
500.0000 [IU] | Freq: Once | INTRAVENOUS | Status: AC | PRN
  Administered 2019-02-28: 500 [IU]
  Filled 2019-02-28: qty 5

## 2019-02-28 MED ORDER — FAMOTIDINE 20 MG PO TABS
40.0000 mg | ORAL_TABLET | Freq: Once | ORAL | Status: AC
  Administered 2019-02-28: 40 mg via ORAL

## 2019-02-28 MED ORDER — SODIUM CHLORIDE 0.9 % IV SOLN
Freq: Once | INTRAVENOUS | Status: AC
  Administered 2019-02-28: 08:00:00 via INTRAVENOUS
  Filled 2019-02-28: qty 250

## 2019-02-28 MED ORDER — DIPHENHYDRAMINE HCL 25 MG PO CAPS
ORAL_CAPSULE | ORAL | Status: AC
  Filled 2019-02-28: qty 1

## 2019-02-28 MED ORDER — DIPHENHYDRAMINE HCL 25 MG PO TABS
25.0000 mg | ORAL_TABLET | Freq: Once | ORAL | Status: AC
  Administered 2019-02-28: 25 mg via ORAL
  Filled 2019-02-28: qty 1

## 2019-02-28 MED ORDER — ACETAMINOPHEN 325 MG PO TABS
650.0000 mg | ORAL_TABLET | Freq: Once | ORAL | Status: AC
  Administered 2019-02-28: 650 mg via ORAL

## 2019-02-28 MED ORDER — SODIUM CHLORIDE 0.9% FLUSH
10.0000 mL | INTRAVENOUS | Status: DC | PRN
  Administered 2019-02-28: 10 mL
  Filled 2019-02-28: qty 10

## 2019-02-28 MED ORDER — FAMOTIDINE 20 MG PO TABS
ORAL_TABLET | ORAL | Status: AC
  Filled 2019-02-28: qty 2

## 2019-02-28 MED ORDER — SODIUM CHLORIDE 0.9 % IV SOLN
200.0000 mg | Freq: Once | INTRAVENOUS | Status: AC
  Administered 2019-02-28: 200 mg via INTRAVENOUS
  Filled 2019-02-28: qty 8

## 2019-02-28 MED ORDER — ACETAMINOPHEN 325 MG PO TABS
ORAL_TABLET | ORAL | Status: AC
  Filled 2019-02-28: qty 2

## 2019-02-28 NOTE — Patient Instructions (Signed)
[facility] Discharge Instructions for Patients Receiving Chemotherapy  Today you received the following chemotherapy agents: Keytruda (Pembrolizumab)  To help prevent nausea and vomiting after your treatment, we encourage you to take your nausea medication as directed.    If you develop nausea and vomiting that is not controlled by your nausea medication, call the clinic.   BELOW ARE SYMPTOMS THAT SHOULD BE REPORTED IMMEDIATELY:  *FEVER GREATER THAN 100.5 F  *CHILLS WITH OR WITHOUT FEVER  NAUSEA AND VOMITING THAT IS NOT CONTROLLED WITH YOUR NAUSEA MEDICATION  *UNUSUAL SHORTNESS OF BREATH  *UNUSUAL BRUISING OR BLEEDING  TENDERNESS IN MOUTH AND THROAT WITH OR WITHOUT PRESENCE OF ULCERS  *URINARY PROBLEMS  *BOWEL PROBLEMS  UNUSUAL RASH Items with * indicate a potential emergency and should be followed up as soon as possible.  Feel free to call the clinic should you have any questions or concerns. The clinic phone number is [phone].  Please show the CHEMO ALERT CARD at check-in to the Emergency Department and triage nurse.   

## 2019-03-13 ENCOUNTER — Other Ambulatory Visit: Payer: Medicaid Other

## 2019-03-13 ENCOUNTER — Ambulatory Visit: Payer: Medicaid Other | Admitting: Hematology

## 2019-03-13 ENCOUNTER — Ambulatory Visit: Payer: Medicaid Other

## 2019-03-20 ENCOUNTER — Inpatient Hospital Stay: Payer: Medicaid Other | Admitting: Hematology

## 2019-03-20 ENCOUNTER — Inpatient Hospital Stay: Payer: Medicaid Other

## 2019-03-20 ENCOUNTER — Telehealth: Payer: Self-pay | Admitting: *Deleted

## 2019-03-20 NOTE — Telephone Encounter (Signed)
Per Dr. Irene Limbo, as patient son has cold symptoms - source unknown - patient has been exposed and should not reschedule for this week. She can be seen at next scheduled appts on 04/10/2019. Patient verbalized understanding.

## 2019-03-20 NOTE — Telephone Encounter (Signed)
Patient reported cough in [facility] screening call. Contacted patient.  Her cough is her usual cough, symptomatic of her condition, has not worsened. States her allergies are controlled by antihistamine. She needs to reschedule appt for 3/24 due to not having childcare for son, who also has a cold. States she wants to come for appts, but later this week after she finds childcare. Informed her that scheduling message will be sent and they will contact her. Patient verbalized understanding.

## 2019-03-26 ENCOUNTER — Ambulatory Visit: Payer: Medicaid Other | Admitting: Physical Medicine & Rehabilitation

## 2019-04-03 ENCOUNTER — Ambulatory Visit: Payer: Medicaid Other | Admitting: Hematology

## 2019-04-03 ENCOUNTER — Ambulatory Visit: Payer: Medicaid Other

## 2019-04-03 ENCOUNTER — Other Ambulatory Visit: Payer: Medicaid Other

## 2019-04-05 ENCOUNTER — Encounter: Payer: Medicaid Other | Admitting: Physical Medicine & Rehabilitation

## 2019-04-09 NOTE — Progress Notes (Signed)
Heidi [name]  HEMATOLOGY ONCOLOGY PROGRESS NOTE  Date of service:   04/10/19     Patient Care Team: Clinic, General Medical as PCP - General (Family Medicine)  Chief complaint: Follow-up for Hodgkin's lymphoma  Diagnosis:   Refractory Mixed cellularity Hodgkin's lymphoma IVBE with extensive lymphadenopathy including right axillary, mediastinal and upper retroperitoneal and now biopsy proven pulmonary involvement. She was noted to have significant constitutional symptoms including significant weight loss, fevers chills and some night sweats.  Current Treatment: Third line therapy ICE for 2 planned cycles.  She was lost to f/u for >8yr  Previous treatment 5 cycles of AVD (without bleomycin due to lung involvement and DLCO of 37%, active smoker) Multiple avoidable treatment delays due to the patient's noncompliance with follow-up for avoidable reasons. She has been counseled repeatedly that this would increase the likelihood of unfavorable outcome.  2nd line therapy with Bendamustine + Brentuximab s/p 7 cycles.  3rd line therapy with ICE s/p 2 cycles   INTERVAL HISTORY:  Heidi [name] is here for follow-up for her Hodgkins lymphoma for C4 Pembrolizumab treatment. The patient's last visit with [place] was on 02/27/19. The pt reports that she is doing well overall.   The pt reports that she has not developed any new concerns in the interim. She notes that she has been very compliant with staying at home and not going out into public to avoid possible infections. She notes that her breathing is "so-so" and has remained stable. She denies any fevers, chills, night sweats or concerns for infections. She denies mouth sores, diarrhea, or skin rashes and notes that she has continued tolerating treatment very well.   Lab results today (04/10/19) of CBC w/diff is as follows: all values are WNL except for RBC at 3.44, HGB at 10.8, HCT at 32.6, Lymphs abs at 300. 04/10/19 Sed Rate and CMP are pending  On review of  systems, pt reports stable breathing, stable energy levels, eating well, and denies fevers, chills, night sweats, new lumps or bumps, changes in bowel habits, skin rashes, urination changes, and any other symptoms.   REVIEW OF SYSTEMS:    A 10+ POINT REVIEW OF SYSTEMS WAS OBTAINED including neurology, dermatology, psychiatry, cardiac, respiratory, lymph, extremities, GI, GU, Musculoskeletal, constitutional, breasts, reproductive, HEENT.  All pertinent positives are noted in the HPI.  All others are negative.  . Past Medical History:  Diagnosis Date  . ARDS (adult respiratory distress syndrome) ([place])   . Asthma   . Hodgkin lymphoma ([place])   . Hypertension     . Past Surgical History:  Procedure Laterality Date  . AXILLARY LYMPH NODE BIOPSY Right 03/19/2016   Procedure: AXILLARY LYMPH NODE BIOPSY;  Surgeon: Armandina Gemma, MD;  Location: [facility];  Service: General;  Laterality: Right;  . IR GENERIC HISTORICAL  12/22/2016   IR FLUORO GUIDE PORT INSERTION LEFT 12/22/2016 [facility]  . IR GENERIC HISTORICAL  12/22/2016   IR US GUIDE VASC ACCESS LEFT 12/22/2016 [facility]  . IR GENERIC HISTORICAL  12/22/2016   IR CV LINE INJECTION 12/22/2016 [facility]  . IR GENERIC HISTORICAL  12/22/2016   IR REMOVAL TUN ACCESS W/ PORT W/O FL MOD SED 12/22/2016 [facility]  . VIDEO BRONCHOSCOPY Bilateral 11/26/2016   Procedure: VIDEO BRONCHOSCOPY WITH FLUORO;  Surgeon: Rigoberto Noel, MD;  Location: [facility];  Service: Cardiopulmonary;  Laterality: Bilateral;    . Social History   Tobacco Use  . Smoking status: Former Smoker    Packs/day: 0.50  Years: 15.00    Pack years: 7.50    Types: Cigarettes    Last attempt to quit: 03/27/2016    Years since quitting: 3.0  . Smokeless tobacco: Never Used  Substance Use Topics  . Alcohol use: Yes    Comment: occasional now  . Drug use: Not on file    ALLERGIES:  has No Known Allergies.  MEDICATIONS:  Current Outpatient Medications   Medication Sig Dispense Refill  . acyclovir (ZOVIRAX) 400 MG tablet Take 1 tablet (400 mg total) by mouth 2 (two) times daily. Start after completion of Valtrex 60 tablet 6  . amitriptyline (ELAVIL) 10 MG tablet Take 1 tablet (10 mg total) by mouth at bedtime. (Patient not taking: Reported on 11/14/2018) 20 tablet 0  . gabapentin (NEURONTIN) 300 MG capsule Take 1 capsule (300 mg total) by mouth 2 (two) times daily. 60 capsule 0  . oxyCODONE-acetaminophen (PERCOCET/ROXICET) 5-325 MG tablet Take 1-2 tablets by mouth every 6 (six) hours as needed for severe pain. 50 tablet 0   No current facility-administered medications for this visit.    Facility-Administered Medications Ordered in Other Visits  Medication Dose Route Frequency Provider Last Rate Last Dose  . sodium chloride flush (NS) 0.9 % injection 10 mL  10 mL Intracatheter PRN Brunetta Genera, MD        PHYSICAL EXAMINATION: ECOG PERFORMANCE STATUS: 1 - Symptomatic but completely ambulatory  Vitals:   04/10/19 0949  BP: 122/77  Pulse: 86  Resp: 18  Temp: 98.6 F (37 C)  SpO2: 98%    Filed Weights   04/10/19 0949  Weight: 147 lb 9.6 oz (67 kg)   .Body mass index is 23.12 kg/m.  GENERAL:alert, in no acute distress and comfortable SKIN: no acute rashes, no significant lesions EYES: conjunctiva are pink and non-injected, sclera anicteric OROPHARYNX: MMM, no exudates, no oropharyngeal erythema or ulceration NECK: supple, no JVD LYMPH:  no palpable lymphadenopathy in the cervical, axillary or inguinal regions LUNGS: unchanged decreased breath sounds without rales nor rhonchi HEART: regular rate & rhythm ABDOMEN:  normoactive bowel sounds , non tender, not distended. No palpable hepatosplenomegaly.  Extremity: no pedal edema PSYCH: alert & oriented x 3 with fluent speech NEURO: no focal motor/sensory deficits  LABORATORY DATA:   I have reviewed the data as listed  . CBC Latest Ref Rng & Units 04/10/2019 02/27/2019  02/06/2019  WBC 4.0 - 10.5 K/uL 4.9 3.0(L) 3.2(L)  Hemoglobin 12.0 - 15.0 g/dL 10.8(L) 10.3(L) 10.4(L)  Hematocrit 36.0 - 46.0 % 32.6(L) 31.7(L) 31.2(L)  Platelets 150 - 400 K/uL 318 242 238   . CBC    Component Value Date/Time   WBC 4.9 04/10/2019 0940   RBC 3.44 (L) 04/10/2019 0940   HGB 10.8 (L) 04/10/2019 0940   HGB 10.6 (L) 11/07/2018 1047   HGB 11.7 07/20/2017 1315   HCT 32.6 (L) 04/10/2019 0940   HCT 35.2 07/20/2017 1315   PLT 318 04/10/2019 0940   PLT 473 (H) 11/07/2018 1047   PLT 266 07/20/2017 1315   MCV 94.8 04/10/2019 0940   MCV 93.2 07/20/2017 1315   MCH 31.4 04/10/2019 0940   MCHC 33.1 04/10/2019 0940   RDW 12.8 04/10/2019 0940   RDW 14.5 07/20/2017 1315   LYMPHSABS 0.3 (L) 04/10/2019 0940   LYMPHSABS 0.3 (L) 07/20/2017 1315   MONOABS 0.8 04/10/2019 0940   MONOABS 0.5 07/20/2017 1315   EOSABS 0.3 04/10/2019 0940   EOSABS 0.2 07/20/2017 1315   BASOSABS 0.0 04/10/2019 0940  BASOSABS 0.0 07/20/2017 1315   . CMP Latest Ref Rng & Units 02/27/2019 02/06/2019 01/23/2019  Glucose 70 - 99 mg/dL 83 71 84  BUN 6 - 20 mg/dL 14 8 13   Creatinine 0.44 - 1.00 mg/dL 0.92 0.80 0.86  Sodium 135 - 145 mmol/L 138 140 136  Potassium 3.5 - 5.1 mmol/L 4.2 4.5 5.0  Chloride 98 - 111 mmol/L 105 105 102  CO2 22 - 32 mmol/L 26 27 26   Calcium 8.9 - 10.3 mg/dL 8.4(L) 8.9 9.4  Total Protein 6.5 - 8.1 g/dL 7.1 7.1 8.0  Total Bilirubin 0.3 - 1.2 mg/dL 0.4 0.4 0.6  Alkaline Phos 38 - 126 U/L 120 118 116  AST 15 - 41 U/L 19 16 16   ALT 0 - 44 U/L 9 9 9      RADIOGRAPHIC STUDIES: I have personally reviewed the radiological images as listed and agreed with the findings in the report. No results found.  ASSESSMENT & PLAN:   33 y.o. 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 She was noted to  have significant constitutional symptoms including significant weight loss, fevers chills and some night sweats. HIV negative Hepatitis C and hepatitis B serologies negative. Echo with normal ejection fraction. Patient was treated with 5 cycles of AVD (Bleomycin held due to poor DLCO 37% and ongoing smoking). Multiple avoidable treatment delays due to the patient's noncompliance with follow-up for avoidable reasons. She has been counseled repeatedly that this would increase the likelihood of unfavorable outcome.  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 S/p 6 cycles of 2nd line treatment with Bendamustine/Brentuximab And 1 cycle of Bretuximab alone  -PET/CT scan results from 04/14/2017 were discussed in details. She appears to have some persistent disease in her right lung at Deauville 5. It is difficult to say if this is recurrent or persistent disease since the patient failed to follow-up on multiple scheduled PET/CT scans in the early part and prior to her second line treatment.  Patient was lost to followup for >1 yr  09/26/18 PET/CT revealed Imaging findings compatible with recurrence of disease. 2. Multiple new large areas of hypermetabolic nodularity and airspace consolidation within both lungs which is presumed to represent pulmonary involvement by lymphoma. Deauville criteria 5. 3. New hypermetabolic left supraclavicular, left retroperitoneal, and bilateral pelvic lymph nodes. Deauville criteria 5. 4. Multifocal hypermetabolic osseous lesions. Deauville criteria 5. 5. Small volume of ascites, new.   12/06/18 PET/CT revealed Generally improved appearance with previously mostly Deauville 5 activity now mostly Deauville 4 activity. 2. Layering of much of the airspace opacity in the lungs, although considerable right perihilar airspace opacity remains. The remaining pulmonary opacities are assess this Deauville 5 on the right and over L4 on the left, and were  previously Deauville 5 bilaterally. 3. Mildly reduced size and moderately reduced activity in the abdominopelvic lymph nodes which are now dove L4. 4. The previously seen left supraclavicular lymph node seems to have completely resolved (Deauville 0). 5. The skeletal lesions remain at Deauville 4, although in absolute terms have decreased in SUV compared to previous. 6. No new regions of malignant involvement compared to prior exam. 7. Other imaging findings of potential clinical significance: Low-density blood pool suggests anemia. Pectus excavatum. Volume loss in the right hemithorax.  #2 s/p hypoxic respiratory failurewith dense right lung consolidation and left upper lobe consolidation with SVC syndrome.  Patient has completed palliative radiation to the right lung mass causing SVC compression.  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  #4 Non compliancewith clinic and treatment followup. Missed 2nd dose of bendamustine with C2. Has missed multiple appointment for her PET/CT and missed appointment at [facility] for consideration of Transplant.  Pt was lost to follow up after July 2018 and returned on 08/31/18 Discussed the patient's goals of care and the pt noted that she is ready to begin treatment again and maintain compliance and follow ups   #5 h/o Grade 1 neuropathyfrom Brentuximab-Vedotin - resolved  #6 Shingles outbreak - curentlyresolved but with significant post herpetic neuralgia  #7 Significant anemia and thrombocytopenia after C1 of ICE -- will need close monitoring.  #8 DVT Prophylaxis - lovenox  PLAN: -Discussed pt labwork today, 04/10/19; blood counts are okay, chemistries are pending -The pt has no prohibitive toxicities from continuing C4 Pembrolizumab at this time. -Will repeat PET/CT after  completing 6 cycles of Pembrolizumab -Continue Gabapentin,Amitryptiline andPrn percocet for PHN. -Referral to interventional pain mx for intercostal nerve block for Post herpetic neuralgia, scheduled for 03/26/19 -Continue Acyclovirfor shingles prophylaxis -Will see the pt back in 3 weeks   -RTC for C5 and C6 of treatment with labs and MD as is currently scheduled.   The total time spent in the appt was 20 minutes and more than 50% was on counseling and direct patient cares.    Sullivan Lone MD [place] AAHIVMS [facility] Hematology/Oncology Physician [facility]  (Office):       [phone] (Work cell):  [phone] (Fax):           [phone]  I, Baldwin [place], am acting as a scribe for Dr. Sullivan Lone.   .I have reviewed the above documentation for accuracy and completeness, and I agree with the above. Brunetta Genera MD

## 2019-04-10 ENCOUNTER — Inpatient Hospital Stay (HOSPITAL_BASED_OUTPATIENT_CLINIC_OR_DEPARTMENT_OTHER): Payer: Medicaid Other | Admitting: Hematology

## 2019-04-10 ENCOUNTER — Inpatient Hospital Stay: Payer: Medicaid Other

## 2019-04-10 ENCOUNTER — Other Ambulatory Visit: Payer: Self-pay

## 2019-04-10 ENCOUNTER — Telehealth: Payer: Self-pay | Admitting: Hematology

## 2019-04-10 ENCOUNTER — Inpatient Hospital Stay: Payer: Medicaid Other | Attending: Hematology

## 2019-04-10 VITALS — BP 122/77 | HR 86 | Temp 98.6°F | Resp 18 | Ht 67.0 in | Wt 147.6 lb

## 2019-04-10 DIAGNOSIS — Z9114 Patient's other noncompliance with medication regimen: Secondary | ICD-10-CM

## 2019-04-10 DIAGNOSIS — Z5112 Encounter for antineoplastic immunotherapy: Secondary | ICD-10-CM

## 2019-04-10 DIAGNOSIS — Z7189 Other specified counseling: Secondary | ICD-10-CM

## 2019-04-10 DIAGNOSIS — Z9119 Patient's noncompliance with other medical treatment and regimen: Secondary | ICD-10-CM | POA: Insufficient documentation

## 2019-04-10 DIAGNOSIS — C8198 Hodgkin lymphoma, unspecified, lymph nodes of multiple sites: Secondary | ICD-10-CM

## 2019-04-10 DIAGNOSIS — Z87891 Personal history of nicotine dependence: Secondary | ICD-10-CM | POA: Insufficient documentation

## 2019-04-10 DIAGNOSIS — C8128 Mixed cellularity classical Hodgkin lymphoma, lymph nodes of multiple sites: Secondary | ICD-10-CM | POA: Diagnosis present

## 2019-04-10 DIAGNOSIS — Z923 Personal history of irradiation: Secondary | ICD-10-CM

## 2019-04-10 DIAGNOSIS — B0229 Other postherpetic nervous system involvement: Secondary | ICD-10-CM

## 2019-04-10 DIAGNOSIS — Z79899 Other long term (current) drug therapy: Secondary | ICD-10-CM | POA: Insufficient documentation

## 2019-04-10 DIAGNOSIS — D649 Anemia, unspecified: Secondary | ICD-10-CM | POA: Diagnosis not present

## 2019-04-10 DIAGNOSIS — Z95828 Presence of other vascular implants and grafts: Secondary | ICD-10-CM

## 2019-04-10 LAB — CBC WITH DIFFERENTIAL/PLATELET
Abs Immature Granulocytes: 0.01 10*3/uL (ref 0.00–0.07)
Basophils Absolute: 0 10*3/uL (ref 0.0–0.1)
Basophils Relative: 0 %
Eosinophils Absolute: 0.3 10*3/uL (ref 0.0–0.5)
Eosinophils Relative: 6 %
HCT: 32.6 % — ABNORMAL LOW (ref 36.0–46.0)
Hemoglobin: 10.8 g/dL — ABNORMAL LOW (ref 12.0–15.0)
Immature Granulocytes: 0 %
Lymphocytes Relative: 6 %
Lymphs Abs: 0.3 10*3/uL — ABNORMAL LOW (ref 0.7–4.0)
MCH: 31.4 pg (ref 26.0–34.0)
MCHC: 33.1 g/dL (ref 30.0–36.0)
MCV: 94.8 fL (ref 80.0–100.0)
Monocytes Absolute: 0.8 10*3/uL (ref 0.1–1.0)
Monocytes Relative: 16 %
Neutro Abs: 3.4 10*3/uL (ref 1.7–7.7)
Neutrophils Relative %: 72 %
Platelets: 318 10*3/uL (ref 150–400)
RBC: 3.44 MIL/uL — ABNORMAL LOW (ref 3.87–5.11)
RDW: 12.8 % (ref 11.5–15.5)
WBC: 4.9 10*3/uL (ref 4.0–10.5)
nRBC: 0 % (ref 0.0–0.2)

## 2019-04-10 LAB — CMP (CANCER CENTER ONLY)
ALT: 6 U/L (ref 0–44)
AST: 15 U/L (ref 15–41)
Albumin: 3.3 g/dL — ABNORMAL LOW (ref 3.5–5.0)
Alkaline Phosphatase: 109 U/L (ref 38–126)
Anion gap: 12 (ref 5–15)
BUN: 12 mg/dL (ref 6–20)
CO2: 24 mmol/L (ref 22–32)
Calcium: 8.8 mg/dL — ABNORMAL LOW (ref 8.9–10.3)
Chloride: 101 mmol/L (ref 98–111)
Creatinine: 0.76 mg/dL (ref 0.44–1.00)
GFR, Est AFR Am: >60 mL/min (ref >60)
GFR, Estimated: >60 mL/min (ref >60)
Glucose, Bld: 78 mg/dL (ref 70–99)
Potassium: 4.1 mmol/L (ref 3.5–5.1)
Sodium: 137 mmol/L (ref 135–145)
Total Bilirubin: 0.5 mg/dL (ref 0.3–1.2)
Total Protein: 7.4 g/dL (ref 6.5–8.1)

## 2019-04-10 LAB — SEDIMENTATION RATE: Sed Rate: 53 mm/hr — ABNORMAL HIGH (ref 0–22)

## 2019-04-10 MED ORDER — SODIUM CHLORIDE 0.9% FLUSH
10.0000 mL | INTRAVENOUS | Status: DC | PRN
  Administered 2019-04-10: 10 mL
  Filled 2019-04-10: qty 10

## 2019-04-10 MED ORDER — SODIUM CHLORIDE 0.9 % IV SOLN
Freq: Once | INTRAVENOUS | Status: AC
  Administered 2019-04-10: 11:00:00 via INTRAVENOUS
  Filled 2019-04-10: qty 250

## 2019-04-10 MED ORDER — SODIUM CHLORIDE 0.9 % IV SOLN
200.0000 mg | Freq: Once | INTRAVENOUS | Status: AC
  Administered 2019-04-10: 200 mg via INTRAVENOUS
  Filled 2019-04-10: qty 8

## 2019-04-10 MED ORDER — DIPHENHYDRAMINE HCL 25 MG PO TABS
25.0000 mg | ORAL_TABLET | Freq: Once | ORAL | Status: AC
  Administered 2019-04-10: 25 mg via ORAL
  Filled 2019-04-10: qty 1

## 2019-04-10 MED ORDER — SODIUM CHLORIDE 0.9% FLUSH
10.0000 mL | INTRAVENOUS | Status: DC | PRN
  Filled 2019-04-10: qty 10

## 2019-04-10 MED ORDER — ACETAMINOPHEN 325 MG PO TABS
650.0000 mg | ORAL_TABLET | Freq: Once | ORAL | Status: AC
  Administered 2019-04-10: 650 mg via ORAL

## 2019-04-10 MED ORDER — FAMOTIDINE 20 MG PO TABS
ORAL_TABLET | ORAL | Status: AC
  Filled 2019-04-10: qty 1

## 2019-04-10 MED ORDER — DIPHENHYDRAMINE HCL 25 MG PO CAPS
ORAL_CAPSULE | ORAL | Status: AC
  Filled 2019-04-10: qty 1

## 2019-04-10 MED ORDER — HEPARIN SOD (PORK) LOCK FLUSH 100 UNIT/ML IV SOLN
500.0000 [IU] | Freq: Once | INTRAVENOUS | Status: DC | PRN
  Filled 2019-04-10: qty 5

## 2019-04-10 MED ORDER — FAMOTIDINE 20 MG PO TABS
40.0000 mg | ORAL_TABLET | Freq: Once | ORAL | Status: AC
  Administered 2019-04-10: 40 mg via ORAL

## 2019-04-10 MED ORDER — ACETAMINOPHEN 325 MG PO TABS
ORAL_TABLET | ORAL | Status: AC
  Filled 2019-04-10: qty 2

## 2019-04-10 NOTE — Telephone Encounter (Signed)
Per 4/14 los -RTC for C5 and C6 of treatment with labs and MD as is currently scheduled.

## 2019-04-10 NOTE — Patient Instructions (Addendum)
[facility] Discharge Instructions for Patients Receiving Chemotherapy  Today you received the following chemotherapy agents:  Keytruda (pembrolizumab)  To help prevent nausea and vomiting after your treatment, we encourage you to take your nausea medication as prescribed.   If you develop nausea and vomiting that is not controlled by your nausea medication, call the clinic.   BELOW ARE SYMPTOMS THAT SHOULD BE REPORTED IMMEDIATELY:  *FEVER GREATER THAN 100.5 F  *CHILLS WITH OR WITHOUT FEVER  NAUSEA AND VOMITING THAT IS NOT CONTROLLED WITH YOUR NAUSEA MEDICATION  *UNUSUAL SHORTNESS OF BREATH  *UNUSUAL BRUISING OR BLEEDING  TENDERNESS IN MOUTH AND THROAT WITH OR WITHOUT PRESENCE OF ULCERS  *URINARY PROBLEMS  *BOWEL PROBLEMS  UNUSUAL RASH Items with * indicate a potential emergency and should be followed up as soon as possible.  Feel free to call the clinic should you have any questions or concerns. The clinic phone number is [phone].  Please show the [place] at check-in to the Emergency Department and triage nurse.   Coronavirus (COVID-19) Are you at risk?  Are you at risk for the Coronavirus (COVID-19)?  To be considered HIGH RISK for Coronavirus (COVID-19), you have to meet the following criteria:  . Traveled to [place], [place], [place], [place] or [place]; or in the [place] to [place], [place], [place], or [place]; and have fever, cough, and shortness of breath within the last 2 weeks of travel OR . Been in close contact with a person diagnosed with COVID-19 within the last 2 weeks and have fever, cough, and shortness of breath . IF YOU DO NOT MEET THESE CRITERIA, YOU ARE CONSIDERED LOW RISK FOR COVID-19.  What to do if you are HIGH RISK for COVID-19?  [name] Kitchen If you are having a medical emergency, call 911. . Seek medical care right away. Before you go to a doctor's office, urgent care or emergency department, call ahead and tell  them about your recent travel, contact with someone diagnosed with COVID-19, and your symptoms. You should receive instructions from your physician's office regarding next steps of care.  . When you arrive at healthcare provider, tell the healthcare staff immediately you have returned from visiting [place], [place], [place], [place] or [place]; or traveled in the [place] to [place], [place], [place], or [place]; in the last two weeks or you have been in close contact with a person diagnosed with COVID-19 in the last 2 weeks.   . Tell the health care staff about your symptoms: fever, cough and shortness of breath. . After you have been seen by a medical provider, you will be either: o Tested for (COVID-19) and discharged home on quarantine except to seek medical care if symptoms worsen, and asked to  - Stay home and avoid contact with others until you get your results (4-5 days)  - Avoid travel on public transportation if possible (such as bus, train, or airplane) or o Sent to the Emergency Department by EMS for evaluation, COVID-19 testing, and possible admission depending on your condition and test results.  What to do if you are LOW RISK for COVID-19?  Reduce your risk of any infection by using the same precautions used for avoiding the common cold or flu:  [name] Kitchen Wash your hands often with soap and warm water for at least 20 seconds.  If soap and water are not readily available, use an alcohol-based hand sanitizer with at least 60% alcohol.  . If  coughing or sneezing, cover your mouth and nose by coughing or sneezing into the elbow areas of your shirt or coat, into a tissue or into your sleeve (not your hands). . Avoid shaking hands with others and consider head nods or verbal greetings only. . Avoid touching your eyes, nose, or mouth with unwashed hands.  . Avoid close contact with people who are sick. . Avoid places or events with large numbers of people in one location, like concerts or  sporting events. . Carefully consider travel plans you have or are making. . If you are planning any travel outside or inside the [place], visit the CDC's Travelers' Health webpage for the latest health notices. . If you have some symptoms but not all symptoms, continue to monitor at home and seek medical attention if your symptoms worsen. . If you are having a medical emergency, call 911.   ADDITIONAL HEALTHCARE OPTIONS FOR PATIENTS  [facility] / e-Visit: [email or web address]         [facility]: [phone]  [facility]: [phone]                   [facility]: [phone]   

## 2019-05-01 ENCOUNTER — Inpatient Hospital Stay: Payer: Medicaid Other | Admitting: Hematology

## 2019-05-01 ENCOUNTER — Inpatient Hospital Stay: Payer: Medicaid Other | Attending: Hematology

## 2019-05-01 ENCOUNTER — Inpatient Hospital Stay: Payer: Medicaid Other

## 2019-05-01 DIAGNOSIS — B0229 Other postherpetic nervous system involvement: Secondary | ICD-10-CM | POA: Insufficient documentation

## 2019-05-01 DIAGNOSIS — Z5112 Encounter for antineoplastic immunotherapy: Secondary | ICD-10-CM | POA: Insufficient documentation

## 2019-05-01 DIAGNOSIS — Z79899 Other long term (current) drug therapy: Secondary | ICD-10-CM | POA: Insufficient documentation

## 2019-05-01 DIAGNOSIS — Z9119 Patient's noncompliance with other medical treatment and regimen: Secondary | ICD-10-CM | POA: Insufficient documentation

## 2019-05-01 DIAGNOSIS — I10 Essential (primary) hypertension: Secondary | ICD-10-CM | POA: Insufficient documentation

## 2019-05-01 DIAGNOSIS — Z87891 Personal history of nicotine dependence: Secondary | ICD-10-CM | POA: Insufficient documentation

## 2019-05-01 DIAGNOSIS — Z9114 Patient's other noncompliance with medication regimen: Secondary | ICD-10-CM | POA: Insufficient documentation

## 2019-05-01 DIAGNOSIS — Z923 Personal history of irradiation: Secondary | ICD-10-CM | POA: Insufficient documentation

## 2019-05-01 DIAGNOSIS — D649 Anemia, unspecified: Secondary | ICD-10-CM | POA: Insufficient documentation

## 2019-05-01 DIAGNOSIS — C8128 Mixed cellularity classical Hodgkin lymphoma, lymph nodes of multiple sites: Secondary | ICD-10-CM | POA: Insufficient documentation

## 2019-05-03 ENCOUNTER — Telehealth: Payer: Self-pay | Admitting: *Deleted

## 2019-05-03 NOTE — Telephone Encounter (Signed)
Per Dr. Irene Limbo: Please schedule for all appts missed on 5/5 on Monday 5/11 if possible. Patient contacted and in agreement. Schedule msg sent

## 2019-05-03 NOTE — Telephone Encounter (Signed)
Contacted patient regarding missed appts on Tuesday 05/01/2019. Patient states she is ok, she just couldn't;t come. Advised patient that she had appts scheduled for 05/22/2019 and gave her times for appts. Patient states she will come to those appts.

## 2019-05-04 ENCOUNTER — Telehealth: Payer: Self-pay | Admitting: Hematology

## 2019-05-04 NOTE — Telephone Encounter (Signed)
Scheduled appt per 5/07 sch message - pt is aware of appt date and time - says she can come in

## 2019-05-04 NOTE — Progress Notes (Signed)
Heidi [name] Kitchen  HEMATOLOGY ONCOLOGY PROGRESS NOTE  Date of service:   05/07/19     Patient Care Team: Clinic, General Medical as PCP - General (Family Medicine)  Chief complaint: Follow-up for Hodgkin's lymphoma  Diagnosis:   Refractory Mixed cellularity Hodgkin's lymphoma IVBE with extensive lymphadenopathy including right axillary, mediastinal and upper retroperitoneal and now biopsy proven pulmonary involvement. She was noted to have significant constitutional symptoms including significant weight loss, fevers chills and some night sweats.  Current Treatment: Third line therapy ICE for 2 planned cycles.  She was lost to f/u for >45yr  Previous treatment 5 cycles of AVD (without bleomycin due to lung involvement and DLCO of 37%, active smoker) Multiple avoidable treatment delays due to the patient's noncompliance with follow-up for avoidable reasons. She has been counseled repeatedly that this would increase the likelihood of unfavorable outcome.  2nd line therapy with Bendamustine + Brentuximab s/p 7 cycles.  3rd line therapy with ICE s/p 2 cycles   INTERVAL HISTORY:  Heidi [name] is here for follow-up for her Hodgkins lymphoma for C5 Pembrolizumab treatment. The patient's last visit with [place] was on 04/10/19. The pt reports that she is doing well overall.  The pt reports that she has not developed any new concerns since our last visit. She notes that her breathing has been stable. She has been actively avoiding public spaces and denies concerns for infections or fevers, chills, or night sweats. She notes that she has continued tolerating treatment well and denies skin rashes or diarrhea.  Lab results today (05/07/19) of CBC w/diff and CMP is as follows: all values are WNL except for RBC at 3.57, HGB at 11.4, HCT at 34.4, Lymphs abs at 500, Calcium at 8.8.  On review of systems, pt reports stable breathing, good energy levels, eating well, and denies fevers, chills, night sweats, skin rashes,  abdominal pains, diarrhea, leg swelling, and any other symptoms.   REVIEW OF SYSTEMS:    A 10+ POINT REVIEW OF SYSTEMS WAS OBTAINED including neurology, dermatology, psychiatry, cardiac, respiratory, lymph, extremities, GI, GU, Musculoskeletal, constitutional, breasts, reproductive, HEENT.  All pertinent positives are noted in the HPI.  All others are negative.  . Past Medical History:  Diagnosis Date  . ARDS (adult respiratory distress syndrome) ([place])   . Asthma   . Hodgkin lymphoma ([place])   . Hypertension     . Past Surgical History:  Procedure Laterality Date  . AXILLARY LYMPH NODE BIOPSY Right 03/19/2016   Procedure: AXILLARY LYMPH NODE BIOPSY;  Surgeon: Armandina Gemma, MD;  Location: [facility];  Service: General;  Laterality: Right;  . IR GENERIC HISTORICAL  12/22/2016   IR FLUORO GUIDE PORT INSERTION LEFT 12/22/2016 [facility]  . IR GENERIC HISTORICAL  12/22/2016   IR US GUIDE VASC ACCESS LEFT 12/22/2016 [facility]  . IR GENERIC HISTORICAL  12/22/2016   IR CV LINE INJECTION 12/22/2016 [facility]  . IR GENERIC HISTORICAL  12/22/2016   IR REMOVAL TUN ACCESS W/ PORT W/O FL MOD SED 12/22/2016 [facility]  . VIDEO BRONCHOSCOPY Bilateral 11/26/2016   Procedure: VIDEO BRONCHOSCOPY WITH FLUORO;  Surgeon: Rigoberto Noel, MD;  Location: [facility];  Service: Cardiopulmonary;  Laterality: Bilateral;    . Social History   Tobacco Use  . Smoking status: Former Smoker    Packs/day: 0.50    Years: 15.00    Pack years: 7.50    Types: Cigarettes    Last attempt to quit: 03/27/2016    Years since quitting: 3.1  .  Smokeless tobacco: Never Used  Substance Use Topics  . Alcohol use: Yes    Comment: occasional now  . Drug use: Not on file    ALLERGIES:  has No Known Allergies.  MEDICATIONS:  Current Outpatient Medications  Medication Sig Dispense Refill  . acyclovir (ZOVIRAX) 400 MG tablet Take 1 tablet (400 mg total) by mouth 2 (two) times daily. Start after completion of  Valtrex 60 tablet 6  . amitriptyline (ELAVIL) 10 MG tablet Take 1 tablet (10 mg total) by mouth at bedtime. (Patient not taking: Reported on 11/14/2018) 20 tablet 0  . gabapentin (NEURONTIN) 300 MG capsule Take 1 capsule (300 mg total) by mouth 2 (two) times daily. 60 capsule 0  . oxyCODONE-acetaminophen (PERCOCET/ROXICET) 5-325 MG tablet Take 1-2 tablets by mouth every 6 (six) hours as needed for severe pain. 50 tablet 0   No current facility-administered medications for this visit.    Facility-Administered Medications Ordered in Other Visits  Medication Dose Route Frequency Provider Last Rate Last Dose  . heparin lock flush 100 unit/mL  500 Units Intracatheter Once PRN Brunetta Genera, MD      . pembrolizumab [facility]) 200 mg in sodium chloride 0.9 % 50 mL chemo infusion  200 mg Intravenous Once Brunetta Genera, MD 116 mL/hr at 05/07/19 1009 200 mg at 05/07/19 1009  . sodium chloride flush (NS) 0.9 % injection 10 mL  10 mL Intracatheter PRN Brunetta Genera, MD      . sodium chloride flush (NS) 0.9 % injection 10 mL  10 mL Intracatheter PRN Brunetta Genera, MD        PHYSICAL EXAMINATION: ECOG PERFORMANCE STATUS: 1 - Symptomatic but completely ambulatory  There were no vitals filed for this visit.  There were no vitals filed for this visit. .There is no height or weight on file to calculate BMI.  GENERAL:alert, in no acute distress and comfortable SKIN: no acute rashes, no significant lesions EYES: conjunctiva are pink and non-injected, sclera anicteric OROPHARYNX: MMM, no exudates, no oropharyngeal erythema or ulceration NECK: supple, no JVD LYMPH:  no palpable lymphadenopathy in the cervical, axillary or inguinal regions LUNGS: unchanged decreased breath sounds without rales nor rhonchi HEART: regular rate & rhythm ABDOMEN:  normoactive bowel sounds , non tender, not distended. No palpable hepatosplenomegaly.  Extremity: no pedal edema PSYCH: alert & oriented  x 3 with fluent speech NEURO: no focal motor/sensory deficits   LABORATORY DATA:   I have reviewed the data as listed  . CBC Latest Ref Rng & Units 05/07/2019 04/10/2019 02/27/2019  WBC 4.0 - 10.5 K/uL 6.4 4.9 3.0(L)  Hemoglobin 12.0 - 15.0 g/dL 11.4(L) 10.8(L) 10.3(L)  Hematocrit 36.0 - 46.0 % 34.4(L) 32.6(L) 31.7(L)  Platelets 150 - 400 K/uL 243 318 242   . CBC    Component Value Date/Time   WBC 6.4 05/07/2019 0820   RBC 3.57 (L) 05/07/2019 0820   HGB 11.4 (L) 05/07/2019 0820   HGB 10.6 (L) 11/07/2018 1047   HGB 11.7 07/20/2017 1315   HCT 34.4 (L) 05/07/2019 0820   HCT 35.2 07/20/2017 1315   PLT 243 05/07/2019 0820   PLT 473 (H) 11/07/2018 1047   PLT 266 07/20/2017 1315   MCV 96.4 05/07/2019 0820   MCV 93.2 07/20/2017 1315   MCH 31.9 05/07/2019 0820   MCHC 33.1 05/07/2019 0820   RDW 13.0 05/07/2019 0820   RDW 14.5 07/20/2017 1315   LYMPHSABS 0.5 (L) 05/07/2019 0820   LYMPHSABS 0.3 (L) 07/20/2017 1315  MONOABS 0.8 05/07/2019 0820   MONOABS 0.5 07/20/2017 1315   EOSABS 0.2 05/07/2019 0820   EOSABS 0.2 07/20/2017 1315   BASOSABS 0.0 05/07/2019 0820   BASOSABS 0.0 07/20/2017 1315   . CMP Latest Ref Rng & Units 05/07/2019 04/10/2019 02/27/2019  Glucose 70 - 99 mg/dL 79 78 83  BUN 6 - 20 mg/dL 14 12 14   Creatinine 0.44 - 1.00 mg/dL 0.96 0.76 0.92  Sodium 135 - 145 mmol/L 138 137 138  Potassium 3.5 - 5.1 mmol/L 4.3 4.1 4.2  Chloride 98 - 111 mmol/L 103 101 105  CO2 22 - 32 mmol/L 26 24 26   Calcium 8.9 - 10.3 mg/dL 8.8(L) 8.8(L) 8.4(L)  Total Protein 6.5 - 8.1 g/dL 7.6 7.4 7.1  Total Bilirubin 0.3 - 1.2 mg/dL 0.3 0.5 0.4  Alkaline Phos 38 - 126 U/L 114 109 120  AST 15 - 41 U/L 31 15 19   ALT 0 - 44 U/L 19 6 9      RADIOGRAPHIC STUDIES: I have personally reviewed the radiological images as listed and agreed with the findings in the report. No results found.  ASSESSMENT & PLAN:   33 y.o. African-American female with  #1 Refractory/Progressive Mixed cellularity  Hodgkin's lymphoma IV BEwith extensive lymphadenopathy including right axillary, mediastinal and upper retroperitoneal and now biopsy proven pulmonary involvement. She was noted to have significant constitutional symptoms including significant weight loss, fevers chills and some night sweats. HIV negative Hepatitis C and hepatitis B serologies negative. Echo with normal ejection fraction. Patient was treated with 5 cycles of AVD (Bleomycin held due to poor DLCO 37% and ongoing smoking). Multiple avoidable treatment delays due to the patient's noncompliance with follow-up for avoidable reasons. She has been counseled repeatedly that this would increase the likelihood of unfavorable outcome.  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 S/p 6 cycles of 2nd line treatment with Bendamustine/Brentuximab And 1 cycle of Bretuximab alone  -PET/CT scan results from 04/14/2017 were discussed in details. She appears to have some persistent disease in her right lung at Deauville 5. It is difficult to say if this is recurrent or persistent disease since the patient failed to follow-up on multiple scheduled PET/CT scans in the early part and prior to her second line treatment.  Patient was lost to followup for >1 yr  09/26/18 PET/CT revealed Imaging findings compatible with recurrence of disease. 2. Multiple new large areas of hypermetabolic nodularity and airspace consolidation within both lungs which is presumed to represent pulmonary involvement by lymphoma. Deauville criteria 5. 3. New hypermetabolic left supraclavicular, left retroperitoneal, and bilateral pelvic lymph nodes. Deauville criteria 5. 4. Multifocal hypermetabolic osseous lesions. Deauville criteria 5. 5. Small volume of ascites, new.   12/06/18 PET/CT revealed Generally improved appearance with previously mostly Deauville 5 activity now mostly Deauville 4 activity. 2. Layering of much of the airspace opacity in  the lungs, although considerable right perihilar airspace opacity remains. The remaining pulmonary opacities are assess this Deauville 5 on the right and over L4 on the left, and were previously Deauville 5 bilaterally. 3. Mildly reduced size and moderately reduced activity in the abdominopelvic lymph nodes which are now dove L4. 4. The previously seen left supraclavicular lymph node seems to have completely resolved (Deauville 0). 5. The skeletal lesions remain at Deauville 4, although in absolute terms have decreased in SUV compared to previous. 6. No new regions of malignant involvement compared to prior exam. 7. Other imaging findings of potential clinical significance: Low-density  blood pool suggests anemia. Pectus excavatum. Volume loss in the right hemithorax.  #2 s/p hypoxic respiratory failurewith dense right lung consolidation and left upper lobe consolidation with SVC syndrome. Patient has completed palliative radiation to the right lung mass causing SVC compression.  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  #4 Non compliancewith clinic and treatment followup. Missed 2nd dose of bendamustine with C2. Has missed multiple appointment for her PET/CT and missed appointment at [facility] for consideration of Transplant.  Pt was lost to follow up after July 2018 and returned on 08/31/18 Discussed the patient's goals of care and the pt noted that she is ready to begin treatment again and maintain compliance and follow ups   #5 h/o Grade 1 neuropathyfrom Brentuximab-Vedotin - resolved  #6 Shingles outbreak - curentlyresolved but with significant post herpetic neuralgia  #7 Significant anemia and thrombocytopenia after C1 of ICE -- will need close monitoring.  #8 DVT Prophylaxis - lovenox  PLAN: -Discussed pt  labwork today, 05/07/19; WBC normal at 6.4k, HGB improved to 11.4, PLT normal at 243k -The pt has no prohibitive toxicities from continuing C5 Pembrolizumab at this time. -Will complete PET/CT in 15 days to evaluate treatment response and for further treatment planning -Continue Gabapentin,Amitryptiline andPrn percocet for PHN. -Referral to interventional pain mx for intercostal nerve block for Post herpetic neuralgia, scheduled for 03/26/19 -Continue Acyclovirfor shingles prophylaxis -Will see the pt back in 3 weeks   PET/CT in 15 days Please schedule next cycle of Immunotherapy as ordered in 3 weeks with labs and MD visit   The total time spent in the appt was 25 minutes and more than 50% was on counseling and direct patient cares.    Sullivan Lone MD [place] AAHIVMS [facility] Hematology/Oncology Physician [facility] [address]  (Office):       [phone] (Work cell):  [phone] (Fax):           [phone]  I, Baldwin [place], am acting as a scribe for Dr. Sullivan Lone.   .I have reviewed the above documentation for accuracy and completeness, and I agree with the above. Brunetta Genera MD

## 2019-05-07 ENCOUNTER — Inpatient Hospital Stay (HOSPITAL_BASED_OUTPATIENT_CLINIC_OR_DEPARTMENT_OTHER): Payer: Medicaid Other | Admitting: Hematology

## 2019-05-07 ENCOUNTER — Inpatient Hospital Stay: Payer: Medicaid Other

## 2019-05-07 ENCOUNTER — Other Ambulatory Visit: Payer: Self-pay

## 2019-05-07 VITALS — BP 121/79 | HR 79 | Temp 98.5°F | Resp 16 | Ht 67.0 in | Wt 144.5 lb

## 2019-05-07 DIAGNOSIS — Z923 Personal history of irradiation: Secondary | ICD-10-CM

## 2019-05-07 DIAGNOSIS — D649 Anemia, unspecified: Secondary | ICD-10-CM

## 2019-05-07 DIAGNOSIS — Z5112 Encounter for antineoplastic immunotherapy: Secondary | ICD-10-CM

## 2019-05-07 DIAGNOSIS — Z87891 Personal history of nicotine dependence: Secondary | ICD-10-CM | POA: Diagnosis not present

## 2019-05-07 DIAGNOSIS — Z7189 Other specified counseling: Secondary | ICD-10-CM

## 2019-05-07 DIAGNOSIS — C8198 Hodgkin lymphoma, unspecified, lymph nodes of multiple sites: Secondary | ICD-10-CM

## 2019-05-07 DIAGNOSIS — C8128 Mixed cellularity classical Hodgkin lymphoma, lymph nodes of multiple sites: Secondary | ICD-10-CM

## 2019-05-07 DIAGNOSIS — Z79899 Other long term (current) drug therapy: Secondary | ICD-10-CM

## 2019-05-07 DIAGNOSIS — Z9119 Patient's noncompliance with other medical treatment and regimen: Secondary | ICD-10-CM

## 2019-05-07 DIAGNOSIS — I10 Essential (primary) hypertension: Secondary | ICD-10-CM

## 2019-05-07 DIAGNOSIS — Z9114 Patient's other noncompliance with medication regimen: Secondary | ICD-10-CM | POA: Diagnosis not present

## 2019-05-07 DIAGNOSIS — B0229 Other postherpetic nervous system involvement: Secondary | ICD-10-CM

## 2019-05-07 DIAGNOSIS — Z95828 Presence of other vascular implants and grafts: Secondary | ICD-10-CM

## 2019-05-07 LAB — CMP (CANCER CENTER ONLY)
ALT: 19 U/L (ref 0–44)
AST: 31 U/L (ref 15–41)
Albumin: 3.7 g/dL (ref 3.5–5.0)
Alkaline Phosphatase: 114 U/L (ref 38–126)
Anion gap: 9 (ref 5–15)
BUN: 14 mg/dL (ref 6–20)
CO2: 26 mmol/L (ref 22–32)
Calcium: 8.8 mg/dL — ABNORMAL LOW (ref 8.9–10.3)
Chloride: 103 mmol/L (ref 98–111)
Creatinine: 0.96 mg/dL (ref 0.44–1.00)
GFR, Est AFR Am: >60 mL/min (ref >60)
GFR, Estimated: >60 mL/min (ref >60)
Glucose, Bld: 79 mg/dL (ref 70–99)
Potassium: 4.3 mmol/L (ref 3.5–5.1)
Sodium: 138 mmol/L (ref 135–145)
Total Bilirubin: 0.3 mg/dL (ref 0.3–1.2)
Total Protein: 7.6 g/dL (ref 6.5–8.1)

## 2019-05-07 LAB — CBC WITH DIFFERENTIAL/PLATELET
Abs Immature Granulocytes: 0.02 10*3/uL (ref 0.00–0.07)
Basophils Absolute: 0 10*3/uL (ref 0.0–0.1)
Basophils Relative: 0 %
Eosinophils Absolute: 0.2 10*3/uL (ref 0.0–0.5)
Eosinophils Relative: 3 %
HCT: 34.4 % — ABNORMAL LOW (ref 36.0–46.0)
Hemoglobin: 11.4 g/dL — ABNORMAL LOW (ref 12.0–15.0)
Immature Granulocytes: 0 %
Lymphocytes Relative: 7 %
Lymphs Abs: 0.5 10*3/uL — ABNORMAL LOW (ref 0.7–4.0)
MCH: 31.9 pg (ref 26.0–34.0)
MCHC: 33.1 g/dL (ref 30.0–36.0)
MCV: 96.4 fL (ref 80.0–100.0)
Monocytes Absolute: 0.8 10*3/uL (ref 0.1–1.0)
Monocytes Relative: 12 %
Neutro Abs: 5 10*3/uL (ref 1.7–7.7)
Neutrophils Relative %: 78 %
Platelets: 243 10*3/uL (ref 150–400)
RBC: 3.57 MIL/uL — ABNORMAL LOW (ref 3.87–5.11)
RDW: 13 % (ref 11.5–15.5)
WBC: 6.4 10*3/uL (ref 4.0–10.5)
nRBC: 0 % (ref 0.0–0.2)

## 2019-05-07 MED ORDER — FAMOTIDINE 20 MG PO TABS
ORAL_TABLET | ORAL | Status: AC
  Filled 2019-05-07: qty 2

## 2019-05-07 MED ORDER — SODIUM CHLORIDE 0.9 % IV SOLN
200.0000 mg | Freq: Once | INTRAVENOUS | Status: AC
  Administered 2019-05-07: 200 mg via INTRAVENOUS
  Filled 2019-05-07: qty 8

## 2019-05-07 MED ORDER — SODIUM CHLORIDE 0.9 % IV SOLN
Freq: Once | INTRAVENOUS | Status: AC
  Administered 2019-05-07: 09:00:00 via INTRAVENOUS
  Filled 2019-05-07: qty 250

## 2019-05-07 MED ORDER — DIPHENHYDRAMINE HCL 25 MG PO CAPS
ORAL_CAPSULE | ORAL | Status: AC
  Filled 2019-05-07: qty 1

## 2019-05-07 MED ORDER — SODIUM CHLORIDE 0.9% FLUSH
10.0000 mL | INTRAVENOUS | Status: DC | PRN
  Administered 2019-05-07: 10 mL
  Filled 2019-05-07: qty 10

## 2019-05-07 MED ORDER — ACETAMINOPHEN 325 MG PO TABS
ORAL_TABLET | ORAL | Status: AC
  Filled 2019-05-07: qty 2

## 2019-05-07 MED ORDER — FAMOTIDINE 20 MG PO TABS
40.0000 mg | ORAL_TABLET | Freq: Once | ORAL | Status: AC
  Administered 2019-05-07: 10:00:00 40 mg via ORAL

## 2019-05-07 MED ORDER — DIPHENHYDRAMINE HCL 25 MG PO TABS
25.0000 mg | ORAL_TABLET | Freq: Once | ORAL | Status: AC
  Administered 2019-05-07: 25 mg via ORAL
  Filled 2019-05-07: qty 1

## 2019-05-07 MED ORDER — HEPARIN SOD (PORK) LOCK FLUSH 100 UNIT/ML IV SOLN
500.0000 [IU] | Freq: Once | INTRAVENOUS | Status: AC | PRN
  Administered 2019-05-07: 500 [IU]
  Filled 2019-05-07: qty 5

## 2019-05-07 MED ORDER — ACETAMINOPHEN 325 MG PO TABS
650.0000 mg | ORAL_TABLET | Freq: Once | ORAL | Status: AC
  Administered 2019-05-07: 650 mg via ORAL

## 2019-05-07 NOTE — Patient Instructions (Addendum)
[place] Discharge Instructions for Patients Receiving Chemotherapy  Today you received the following chemotherapy agents:  pembrolizumab Beryle Flock)  To help prevent nausea and vomiting after your treatment, we encourage you to take your nausea medication as prescribed.   If you develop nausea and vomiting that is not controlled by your nausea medication, call the clinic.   BELOW ARE SYMPTOMS THAT SHOULD BE REPORTED IMMEDIATELY:  *FEVER GREATER THAN 100.5 F  *CHILLS WITH OR WITHOUT FEVER  NAUSEA AND VOMITING THAT IS NOT CONTROLLED WITH YOUR NAUSEA MEDICATION  *UNUSUAL SHORTNESS OF BREATH  *UNUSUAL BRUISING OR BLEEDING  TENDERNESS IN MOUTH AND THROAT WITH OR WITHOUT PRESENCE OF ULCERS  *URINARY PROBLEMS  *BOWEL PROBLEMS  UNUSUAL RASH Items with * indicate a potential emergency and should be followed up as soon as possible.  Feel free to call the clinic should you have any questions or concerns. The clinic phone number is [phone].  Please show the [facility] at check-in to the Emergency Department and triage nurse.   Coronavirus (COVID-19) Are you at risk?  Are you at risk for the Coronavirus (COVID-19)?  To be considered HIGH RISK for Coronavirus (COVID-19), you have to meet the following criteria:  . Traveled to [place], [place], [place], [place] or [place]; or in the [place] to [place], [place], [place], or [place]; and have fever, cough, and shortness of breath within the last 2 weeks of travel OR . Been in close contact with a person diagnosed with COVID-19 within the last 2 weeks and have fever, cough, and shortness of breath . IF YOU DO NOT MEET THESE CRITERIA, YOU ARE CONSIDERED LOW RISK FOR COVID-19.  What to do if you are HIGH RISK for COVID-19?  [name] Kitchen If you are having a medical emergency, call 911. . Seek medical care right away. Before you go to a doctor's office, urgent care or emergency department, call ahead and tell  them about your recent travel, contact with someone diagnosed with COVID-19, and your symptoms. You should receive instructions from your physician's office regarding next steps of care.  . When you arrive at healthcare provider, tell the healthcare staff immediately you have returned from visiting [place], [place], [place], [place] or [place]; or traveled in the [place] to [place], [place], [place], or [place]; in the last two weeks or you have been in close contact with a person diagnosed with COVID-19 in the last 2 weeks.   . Tell the health care staff about your symptoms: fever, cough and shortness of breath. . After you have been seen by a medical provider, you will be either: o Tested for (COVID-19) and discharged home on quarantine except to seek medical care if symptoms worsen, and asked to  - Stay home and avoid contact with others until you get your results (4-5 days)  - Avoid travel on public transportation if possible (such as bus, train, or airplane) or o Sent to the Emergency Department by EMS for evaluation, COVID-19 testing, and possible admission depending on your condition and test results.  What to do if you are LOW RISK for COVID-19?  Reduce your risk of any infection by using the same precautions used for avoiding the common cold or flu:  [name] Kitchen Wash your hands often with soap and warm water for at least 20 seconds.  If soap and water are not readily available, use an alcohol-based hand sanitizer with at least 60% alcohol.  . If  coughing or sneezing, cover your mouth and nose by coughing or sneezing into the elbow areas of your shirt or coat, into a tissue or into your sleeve (not your hands). . Avoid shaking hands with others and consider head nods or verbal greetings only. . Avoid touching your eyes, nose, or mouth with unwashed hands.  . Avoid close contact with people who are sick. . Avoid places or events with large numbers of people in one location, like concerts or  sporting events. . Carefully consider travel plans you have or are making. . If you are planning any travel outside or inside the [place], visit the CDC's Travelers' Health webpage for the latest health notices. . If you have some symptoms but not all symptoms, continue to monitor at home and seek medical attention if your symptoms worsen. . If you are having a medical emergency, call 911.   ADDITIONAL HEALTHCARE OPTIONS FOR PATIENTS  [facility] / e-Visit: [email or web address]         [facility]: [phone]  [facility]: [phone]                   [facility]: [phone]   

## 2019-05-08 ENCOUNTER — Telehealth: Payer: Self-pay | Admitting: Hematology

## 2019-05-08 NOTE — Telephone Encounter (Signed)
Scheduled appt per 5/11 los. ° °A calendar will be mailed out. °

## 2019-05-22 ENCOUNTER — Ambulatory Visit: Payer: Medicaid Other

## 2019-05-22 ENCOUNTER — Other Ambulatory Visit: Payer: Medicaid Other

## 2019-05-22 ENCOUNTER — Ambulatory Visit: Payer: Medicaid Other | Admitting: Hematology

## 2019-05-23 ENCOUNTER — Telehealth: Payer: Self-pay | Admitting: *Deleted

## 2019-05-23 ENCOUNTER — Encounter (HOSPITAL_COMMUNITY): Payer: Medicaid Other

## 2019-05-23 NOTE — Telephone Encounter (Signed)
Received faxed Telephone Advice fax from [facility]: Patient needs to cancel/rescheedule appt for PET scan on 5/27. Contacted patient. Patient states having trouble with phone and asked this RN to contact [facility] to notify them that she cannot come to PET today and wants to have it rescheduled. Contacted [employer] cabin crew. Information given to [facility] w/[facility]. She states they will cancel appt for today at 1pm and contact patient to reschedule.

## 2019-05-28 ENCOUNTER — Encounter: Payer: Medicaid Other | Attending: Physical Medicine & Rehabilitation | Admitting: Physical Medicine & Rehabilitation

## 2019-05-28 NOTE — Progress Notes (Signed)
HEMATOLOGY ONCOLOGY PROGRESS NOTE  Date of service:   05/29/19     Patient Care Team: Clinic, General Medical as PCP - General (Family Medicine)  Chief complaint: Follow-up for Hodgkin's lymphoma  Diagnosis:   Refractory Mixed cellularity Hodgkin's lymphoma IVBE with extensive lymphadenopathy including right axillary, mediastinal and upper retroperitoneal and now biopsy proven pulmonary involvement. She was noted to have significant constitutional symptoms including significant weight loss, fevers chills and some night sweats.  Current Treatment: Third line therapy ICE for 2 planned cycles.  She was lost to f/u for >59yr  Previous treatment 5 cycles of AVD (without bleomycin due to lung involvement and DLCO of 37%, active smoker) Multiple avoidable treatment delays due to the patient's noncompliance with follow-up for avoidable reasons. She has been counseled repeatedly that this would increase the likelihood of unfavorable outcome.  2nd line therapy with Bendamustine + Brentuximab s/p 7 cycles.  3rd line therapy with ICE s/p 2 cycles   INTERVAL HISTORY:  [name] is here for follow-up for her Hodgkins lymphoma for C6 Pembrolizumab treatment. The patient's last visit with [place] was on 05/07/19. The pt reports that she is doing well overall.  The pt reports that she will reschedule her PET/CT today. She notes that she has been staying away from crowds. She endorses stable energy levels, stable weight, has been eating well and denies concerns for infections. She notes that her breathing has been stable. She notes that she has continued tolerating treatment well.  Lab results today (05/29/19) of CBC w/diff and CMP is as follows: all values are WNL except for WBC at 3.9k, RBC at 3.73, HGB at 11.7, HCT at 35.7, Lymphs abs at 600. 05/29/19 Sed Rate is pending  On review of systems, pt reports stable energy levels, eating well, stable weight, stable breathing, and denies concerns for  infections, cough, back pains, discomfort at port site, abdominal pains, lower abdominal pains, skin rashes, and any other symptoms.    REVIEW OF SYSTEMS:    A 10+ POINT REVIEW OF SYSTEMS WAS OBTAINED including neurology, dermatology, psychiatry, cardiac, respiratory, lymph, extremities, GI, GU, Musculoskeletal, constitutional, breasts, reproductive, HEENT.  All pertinent positives are noted in the HPI.  All others are negative.  . Past Medical History:  Diagnosis Date  . ARDS (adult respiratory distress syndrome) ([place])   . Asthma   . Hodgkin lymphoma ([place])   . Hypertension     . Past Surgical History:  Procedure Laterality Date  . AXILLARY LYMPH NODE BIOPSY Right 03/19/2016   Procedure: AXILLARY LYMPH NODE BIOPSY;  Surgeon: Armandina Gemma, MD;  Location: [facility];  Service: General;  Laterality: Right;  . IR GENERIC HISTORICAL  12/22/2016   IR FLUORO GUIDE PORT INSERTION LEFT 12/22/2016 [facility]  . IR GENERIC HISTORICAL  12/22/2016   IR US GUIDE VASC ACCESS LEFT 12/22/2016 [facility]  . IR GENERIC HISTORICAL  12/22/2016   IR CV LINE INJECTION 12/22/2016 [facility]  . IR GENERIC HISTORICAL  12/22/2016   IR REMOVAL TUN ACCESS W/ PORT W/O FL MOD SED 12/22/2016 [facility]  . VIDEO BRONCHOSCOPY Bilateral 11/26/2016   Procedure: VIDEO BRONCHOSCOPY WITH FLUORO;  Surgeon: Rigoberto Noel, MD;  Location: [facility];  Service: Cardiopulmonary;  Laterality: Bilateral;    . Social History   Tobacco Use  . Smoking status: Former Smoker    Packs/day: 0.50    Years: 15.00    Pack years: 7.50    Types: Cigarettes    Last attempt to  quit: 03/27/2016    Years since quitting: 3.1  . Smokeless tobacco: Never Used  Substance Use Topics  . Alcohol use: Yes    Comment: occasional now  . Drug use: Not on file    ALLERGIES:  has No Known Allergies.  MEDICATIONS:  Current Outpatient Medications  Medication Sig Dispense Refill  . acyclovir (ZOVIRAX) 400 MG tablet Take 1 tablet (400  mg total) by mouth 2 (two) times daily. Start after completion of Valtrex 60 tablet 6  . amitriptyline (ELAVIL) 10 MG tablet Take 1 tablet (10 mg total) by mouth at bedtime. (Patient not taking: Reported on 11/14/2018) 20 tablet 0  . gabapentin (NEURONTIN) 300 MG capsule Take 1 capsule (300 mg total) by mouth 2 (two) times daily. 60 capsule 0  . oxyCODONE-acetaminophen (PERCOCET/ROXICET) 5-325 MG tablet Take 1-2 tablets by mouth every 6 (six) hours as needed for severe pain. 50 tablet 0   No current facility-administered medications for this visit.    Facility-Administered Medications Ordered in Other Visits  Medication Dose Route Frequency Provider Last Rate Last Dose  . sodium chloride flush (NS) 0.9 % injection 10 mL  10 mL Intracatheter PRN Brunetta Genera, MD        PHYSICAL EXAMINATION: ECOG PERFORMANCE STATUS: 1 - Symptomatic but completely ambulatory  Vitals:   05/29/19 1339  BP: 124/81  Pulse: 89  Resp: 18  Temp: 98.9 F (37.2 C)  SpO2: 99%    Filed Weights   05/29/19 1339  Weight: 143 lb (64.9 kg)   .Body mass index is 22.4 kg/m.  GENERAL:alert, in no acute distress and comfortable SKIN: no acute rashes, no significant lesions EYES: conjunctiva are pink and non-injected, sclera anicteric OROPHARYNX: MMM, no exudates, no oropharyngeal erythema or ulceration NECK: supple, no JVD LYMPH:  no palpable lymphadenopathy in the cervical, axillary or inguinal regions LUNGS: unchanged decreased breath sounds without rales nor rhonchi HEART: regular rate & rhythm ABDOMEN:  normoactive bowel sounds , non tender, not distended. No palpable hepatosplenomegaly.  Extremity: no pedal edema PSYCH: alert & oriented x 3 with fluent speech NEURO: no focal motor/sensory deficits   LABORATORY DATA:   I have reviewed the data as listed  . CBC Latest Ref Rng & Units 05/29/2019 05/07/2019 04/10/2019  WBC 4.0 - 10.5 K/uL 3.9(L) 6.4 4.9  Hemoglobin 12.0 - 15.0 g/dL 11.7(L) 11.4(L)  10.8(L)  Hematocrit 36.0 - 46.0 % 35.7(L) 34.4(L) 32.6(L)  Platelets 150 - 400 K/uL 267 243 318   . CBC    Component Value Date/Time   WBC 3.9 (L) 05/29/2019 1300   RBC 3.73 (L) 05/29/2019 1300   HGB 11.7 (L) 05/29/2019 1300   HGB 10.6 (L) 11/07/2018 1047   HGB 11.7 07/20/2017 1315   HCT 35.7 (L) 05/29/2019 1300   HCT 35.2 07/20/2017 1315   PLT 267 05/29/2019 1300   PLT 473 (H) 11/07/2018 1047   PLT 266 07/20/2017 1315   MCV 95.7 05/29/2019 1300   MCV 93.2 07/20/2017 1315   MCH 31.4 05/29/2019 1300   MCHC 32.8 05/29/2019 1300   RDW 12.5 05/29/2019 1300   RDW 14.5 07/20/2017 1315   LYMPHSABS 0.6 (L) 05/29/2019 1300   LYMPHSABS 0.3 (L) 07/20/2017 1315   MONOABS 0.4 05/29/2019 1300   MONOABS 0.5 07/20/2017 1315   EOSABS 0.2 05/29/2019 1300   EOSABS 0.2 07/20/2017 1315   BASOSABS 0.0 05/29/2019 1300   BASOSABS 0.0 07/20/2017 1315   . CMP Latest Ref Rng & Units 05/29/2019 05/07/2019 04/10/2019  Glucose 70 - 99 mg/dL 75 79 78  BUN 6 - 20 mg/dL 8 14 12   Creatinine 0.44 - 1.00 mg/dL 0.99 0.96 0.76  Sodium 135 - 145 mmol/L 139 138 137  Potassium 3.5 - 5.1 mmol/L 3.9 4.3 4.1  Chloride 98 - 111 mmol/L 102 103 101  CO2 22 - 32 mmol/L 26 26 24   Calcium 8.9 - 10.3 mg/dL 9.0 8.8(L) 8.8(L)  Total Protein 6.5 - 8.1 g/dL 7.9 7.6 7.4  Total Bilirubin 0.3 - 1.2 mg/dL 0.4 0.3 0.5  Alkaline Phos 38 - 126 U/L 106 114 109  AST 15 - 41 U/L 29 31 15   ALT 0 - 44 U/L 16 19 6      RADIOGRAPHIC STUDIES: I have personally reviewed the radiological images as listed and agreed with the findings in the report. No results found.  ASSESSMENT & PLAN:   33 y.o. 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 She was noted to have significant constitutional symptoms including significant weight loss, fevers chills  and some night sweats. HIV negative Hepatitis C and hepatitis B serologies negative. Echo with normal ejection fraction. Patient was treated with 5 cycles of AVD (Bleomycin held due to poor DLCO 37% and ongoing smoking). Multiple avoidable treatment delays due to the patient's noncompliance with follow-up for avoidable reasons. She has been counseled repeatedly that this would increase the likelihood of unfavorable outcome.  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 S/p 6 cycles of 2nd line treatment with Bendamustine/Brentuximab And 1 cycle of Bretuximab alone  -PET/CT scan results from 04/14/2017 were discussed in details. She appears to have some persistent disease in her right lung at Deauville 5. It is difficult to say if this is recurrent or persistent disease since the patient failed to follow-up on multiple scheduled PET/CT scans in the early part and prior to her second line treatment.  Patient was lost to followup for >1 yr  09/26/18 PET/CT revealed Imaging findings compatible with recurrence of disease. 2. Multiple new large areas of hypermetabolic nodularity and airspace consolidation within both lungs which is presumed to represent pulmonary involvement by lymphoma. Deauville criteria 5. 3. New hypermetabolic left supraclavicular, left retroperitoneal, and bilateral pelvic lymph nodes. Deauville criteria 5. 4. Multifocal hypermetabolic osseous lesions. Deauville criteria 5. 5. Small volume of ascites, new.   12/06/18 PET/CT revealed Generally improved appearance with previously mostly Deauville 5 activity now mostly Deauville 4 activity. 2. Layering of much of the airspace opacity in the lungs, although considerable right perihilar airspace opacity remains. The remaining pulmonary opacities are assess this Deauville 5 on the right and over L4 on the left, and were previously Deauville 5 bilaterally. 3. Mildly reduced size and moderately reduced  activity in the abdominopelvic lymph nodes which are now dove L4. 4. The previously seen left supraclavicular lymph node seems to have completely resolved (Deauville 0). 5. The skeletal lesions remain at Deauville 4, although in absolute terms have decreased in SUV compared to previous. 6. No new regions of malignant involvement compared to prior exam. 7. Other imaging findings of potential clinical significance: Low-density blood pool suggests anemia. Pectus excavatum. Volume loss in the right hemithorax.  #2 s/p hypoxic respiratory failurewith dense right lung consolidation and left upper lobe consolidation with SVC syndrome. Patient has completed palliative radiation to the right lung mass causing SVC compression.  11/22/18 PFT which  revealed some improvement in DLCO, however some element of restriction and obstruction is noted   #3 previous h/o SVC syndrome- right facial and right upper extremity swelling -resolved.  #4 Non compliancewith clinic and treatment followup. Missed 2nd dose of bendamustine with C2. Has missed multiple appointment for her PET/CT and missed appointment at [facility] for consideration of Transplant.  Pt was lost to follow up after July 2018 and returned on 08/31/18 Discussed the patient's goals of care and the pt noted that she is ready to begin treatment again and maintain compliance and follow ups   #5 h/o Grade 1 neuropathyfrom Brentuximab-Vedotin - resolved  #6 Shingles outbreak - curentlyresolved but with significant post herpetic neuralgia  #7 Significant anemia and thrombocytopenia after C1 of ICE -- will need close monitoring.  #8 DVT Prophylaxis - lovenox  PLAN: -Discussed pt labwork today, 05/29/19; blood counts and chemistries are stable -The pt has no prohibitive toxicities from continuing C6 Pembrolizumab at this time. Orders placed and reviewed. -Will complete PET/CT in 15 days to evaluate treatment response and for further  treatment planning- pt missed originally scheduled PET/CT and will re-schedule this with central scheduling -Continue Gabapentin,Amitryptiline andPrn percocet for PHN. -Continue Acyclovirfor shingles prophylaxis -Will see the pt back in 3 weeks   -Patient will call central scheduling to reschedule her missed PET/CT -Please schedule for next 2 cycles of Pembrolizumab each with labs and MD visit   The total time spent in the appt was 25 minutes and more than 50% was on counseling and direct patient cares.    Sullivan Lone MD [place] AAHIVMS [facility] Hematology/Oncology Physician [facility]  (Office):       [phone] (Work cell):  [phone] (Fax):           [phone]  I, Baldwin [place], am acting as a scribe for Dr. Sullivan Lone.   .I have reviewed the above documentation for accuracy and completeness, and I agree with the above. Brunetta Genera MD

## 2019-05-29 ENCOUNTER — Telehealth: Payer: Self-pay | Admitting: Hematology

## 2019-05-29 ENCOUNTER — Inpatient Hospital Stay (HOSPITAL_BASED_OUTPATIENT_CLINIC_OR_DEPARTMENT_OTHER): Payer: Medicaid Other | Admitting: Hematology

## 2019-05-29 ENCOUNTER — Inpatient Hospital Stay: Payer: Medicaid Other

## 2019-05-29 ENCOUNTER — Other Ambulatory Visit: Payer: Self-pay

## 2019-05-29 ENCOUNTER — Inpatient Hospital Stay: Payer: Medicaid Other | Attending: Hematology

## 2019-05-29 VITALS — BP 124/81 | HR 89 | Temp 98.9°F | Resp 18 | Ht 67.0 in | Wt 143.0 lb

## 2019-05-29 DIAGNOSIS — B0229 Other postherpetic nervous system involvement: Secondary | ICD-10-CM

## 2019-05-29 DIAGNOSIS — C8128 Mixed cellularity classical Hodgkin lymphoma, lymph nodes of multiple sites: Secondary | ICD-10-CM

## 2019-05-29 DIAGNOSIS — Z9221 Personal history of antineoplastic chemotherapy: Secondary | ICD-10-CM | POA: Insufficient documentation

## 2019-05-29 DIAGNOSIS — Z95828 Presence of other vascular implants and grafts: Secondary | ICD-10-CM

## 2019-05-29 DIAGNOSIS — D6481 Anemia due to antineoplastic chemotherapy: Secondary | ICD-10-CM | POA: Diagnosis not present

## 2019-05-29 DIAGNOSIS — Z5112 Encounter for antineoplastic immunotherapy: Secondary | ICD-10-CM

## 2019-05-29 DIAGNOSIS — D6959 Other secondary thrombocytopenia: Secondary | ICD-10-CM | POA: Diagnosis not present

## 2019-05-29 DIAGNOSIS — F17210 Nicotine dependence, cigarettes, uncomplicated: Secondary | ICD-10-CM | POA: Insufficient documentation

## 2019-05-29 DIAGNOSIS — Z7189 Other specified counseling: Secondary | ICD-10-CM

## 2019-05-29 DIAGNOSIS — C8198 Hodgkin lymphoma, unspecified, lymph nodes of multiple sites: Secondary | ICD-10-CM

## 2019-05-29 LAB — CBC WITH DIFFERENTIAL/PLATELET
Abs Immature Granulocytes: 0 10*3/uL (ref 0.00–0.07)
Basophils Absolute: 0 10*3/uL (ref 0.0–0.1)
Basophils Relative: 1 %
Eosinophils Absolute: 0.2 10*3/uL (ref 0.0–0.5)
Eosinophils Relative: 4 %
HCT: 35.7 % — ABNORMAL LOW (ref 36.0–46.0)
Hemoglobin: 11.7 g/dL — ABNORMAL LOW (ref 12.0–15.0)
Immature Granulocytes: 0 %
Lymphocytes Relative: 15 %
Lymphs Abs: 0.6 10*3/uL — ABNORMAL LOW (ref 0.7–4.0)
MCH: 31.4 pg (ref 26.0–34.0)
MCHC: 32.8 g/dL (ref 30.0–36.0)
MCV: 95.7 fL (ref 80.0–100.0)
Monocytes Absolute: 0.4 10*3/uL (ref 0.1–1.0)
Monocytes Relative: 10 %
Neutro Abs: 2.8 10*3/uL (ref 1.7–7.7)
Neutrophils Relative %: 70 %
Platelets: 267 10*3/uL (ref 150–400)
RBC: 3.73 MIL/uL — ABNORMAL LOW (ref 3.87–5.11)
RDW: 12.5 % (ref 11.5–15.5)
WBC: 3.9 10*3/uL — ABNORMAL LOW (ref 4.0–10.5)
nRBC: 0 % (ref 0.0–0.2)

## 2019-05-29 LAB — CMP (CANCER CENTER ONLY)
ALT: 16 U/L (ref 0–44)
AST: 29 U/L (ref 15–41)
Albumin: 4 g/dL (ref 3.5–5.0)
Alkaline Phosphatase: 106 U/L (ref 38–126)
Anion gap: 11 (ref 5–15)
BUN: 8 mg/dL (ref 6–20)
CO2: 26 mmol/L (ref 22–32)
Calcium: 9 mg/dL (ref 8.9–10.3)
Chloride: 102 mmol/L (ref 98–111)
Creatinine: 0.99 mg/dL (ref 0.44–1.00)
GFR, Est AFR Am: >60 mL/min (ref >60)
GFR, Estimated: >60 mL/min (ref >60)
Glucose, Bld: 75 mg/dL (ref 70–99)
Potassium: 3.9 mmol/L (ref 3.5–5.1)
Sodium: 139 mmol/L (ref 135–145)
Total Bilirubin: 0.4 mg/dL (ref 0.3–1.2)
Total Protein: 7.9 g/dL (ref 6.5–8.1)

## 2019-05-29 LAB — SEDIMENTATION RATE: Sed Rate: 15 mm/hr (ref 0–22)

## 2019-05-29 MED ORDER — DIPHENHYDRAMINE HCL 25 MG PO TABS
25.0000 mg | ORAL_TABLET | Freq: Once | ORAL | Status: AC
  Administered 2019-05-29: 25 mg via ORAL
  Filled 2019-05-29: qty 1

## 2019-05-29 MED ORDER — SODIUM CHLORIDE 0.9 % IV SOLN
200.0000 mg | Freq: Once | INTRAVENOUS | Status: AC
  Administered 2019-05-29: 200 mg via INTRAVENOUS
  Filled 2019-05-29: qty 8

## 2019-05-29 MED ORDER — DIPHENHYDRAMINE HCL 25 MG PO CAPS
ORAL_CAPSULE | ORAL | Status: AC
  Filled 2019-05-29: qty 1

## 2019-05-29 MED ORDER — HEPARIN SOD (PORK) LOCK FLUSH 100 UNIT/ML IV SOLN
500.0000 [IU] | Freq: Once | INTRAVENOUS | Status: AC | PRN
  Administered 2019-05-29: 500 [IU]
  Filled 2019-05-29: qty 5

## 2019-05-29 MED ORDER — SODIUM CHLORIDE 0.9 % IV SOLN
Freq: Once | INTRAVENOUS | Status: AC
  Administered 2019-05-29: 15:00:00 via INTRAVENOUS
  Filled 2019-05-29: qty 250

## 2019-05-29 MED ORDER — ACETAMINOPHEN 325 MG PO TABS
ORAL_TABLET | ORAL | Status: AC
  Filled 2019-05-29: qty 2

## 2019-05-29 MED ORDER — FAMOTIDINE 20 MG PO TABS
ORAL_TABLET | ORAL | Status: AC
  Filled 2019-05-29: qty 2

## 2019-05-29 MED ORDER — SODIUM CHLORIDE 0.9% FLUSH
10.0000 mL | INTRAVENOUS | Status: DC | PRN
  Administered 2019-05-29: 10 mL
  Filled 2019-05-29: qty 10

## 2019-05-29 MED ORDER — ACETAMINOPHEN 325 MG PO TABS
650.0000 mg | ORAL_TABLET | Freq: Once | ORAL | Status: AC
  Administered 2019-05-29: 650 mg via ORAL

## 2019-05-29 MED ORDER — FAMOTIDINE 20 MG PO TABS
40.0000 mg | ORAL_TABLET | Freq: Once | ORAL | Status: AC
  Administered 2019-05-29: 40 mg via ORAL

## 2019-05-29 NOTE — Telephone Encounter (Signed)
Per 6/2 los, appts already scheduled.

## 2019-06-06 ENCOUNTER — Telehealth: Payer: Self-pay | Admitting: Hematology

## 2019-06-06 NOTE — Telephone Encounter (Signed)
I contacted Patient to set up appointment for 6/22 at 1:30.

## 2019-06-13 ENCOUNTER — Ambulatory Visit (HOSPITAL_COMMUNITY)
Admission: RE | Admit: 2019-06-13 | Discharge: 2019-06-13 | Disposition: A | Discharge status: Home / Self Care | Payer: Medicaid Other | Source: Ambulatory Visit | Attending: Hematology | Admitting: Hematology

## 2019-06-13 ENCOUNTER — Other Ambulatory Visit: Payer: Self-pay

## 2019-06-13 DIAGNOSIS — C8198 Hodgkin lymphoma, unspecified, lymph nodes of multiple sites: Secondary | ICD-10-CM | POA: Insufficient documentation

## 2019-06-13 DIAGNOSIS — Z5112 Encounter for antineoplastic immunotherapy: Secondary | ICD-10-CM | POA: Insufficient documentation

## 2019-06-13 LAB — GLUCOSE, CAPILLARY: Glucose-Capillary: 77 mg/dL (ref 70–99)

## 2019-06-13 MED ORDER — FLUDEOXYGLUCOSE F - 18 (FDG) INJECTION
7.1000 | Freq: Once | INTRAVENOUS | Status: AC | PRN
  Administered 2019-06-13: 7.1 via INTRAVENOUS

## 2019-06-14 ENCOUNTER — Ambulatory Visit (HOSPITAL_COMMUNITY): Payer: Medicaid Other

## 2019-06-18 ENCOUNTER — Inpatient Hospital Stay: Payer: Medicaid Other

## 2019-06-18 ENCOUNTER — Inpatient Hospital Stay: Payer: Medicaid Other | Admitting: Hematology

## 2019-06-20 ENCOUNTER — Encounter: Payer: Medicaid Other | Admitting: Physical Medicine & Rehabilitation

## 2019-06-22 ENCOUNTER — Telehealth: Payer: Self-pay | Admitting: *Deleted

## 2019-06-22 NOTE — Telephone Encounter (Signed)
Patient missed appt on 6/22. Per Dr. Irene Limbo, if she can come the early part of next week, it will not require rescheduling the rest of her currently scheduled treatments.  Contacted patient. She said she can come the first part of next week. Schedule message sent.

## 2019-06-27 ENCOUNTER — Telehealth: Payer: Self-pay | Admitting: Hematology

## 2019-06-27 NOTE — Telephone Encounter (Signed)
R/s appt per 6/26 sch message  pt aware of appt date and time

## 2019-06-28 ENCOUNTER — Inpatient Hospital Stay (HOSPITAL_BASED_OUTPATIENT_CLINIC_OR_DEPARTMENT_OTHER): Payer: Medicaid Other | Admitting: Hematology

## 2019-06-28 ENCOUNTER — Inpatient Hospital Stay: Payer: Medicaid Other | Attending: Hematology

## 2019-06-28 ENCOUNTER — Inpatient Hospital Stay: Payer: Medicaid Other

## 2019-06-28 ENCOUNTER — Other Ambulatory Visit: Payer: Self-pay

## 2019-06-28 VITALS — BP 124/88 | HR 88 | Temp 98.3°F | Resp 18

## 2019-06-28 DIAGNOSIS — C8128 Mixed cellularity classical Hodgkin lymphoma, lymph nodes of multiple sites: Secondary | ICD-10-CM

## 2019-06-28 DIAGNOSIS — Z95828 Presence of other vascular implants and grafts: Secondary | ICD-10-CM

## 2019-06-28 DIAGNOSIS — Z9114 Patient's other noncompliance with medication regimen: Secondary | ICD-10-CM

## 2019-06-28 DIAGNOSIS — Z87891 Personal history of nicotine dependence: Secondary | ICD-10-CM

## 2019-06-28 DIAGNOSIS — Z923 Personal history of irradiation: Secondary | ICD-10-CM

## 2019-06-28 DIAGNOSIS — D649 Anemia, unspecified: Secondary | ICD-10-CM

## 2019-06-28 DIAGNOSIS — Z5112 Encounter for antineoplastic immunotherapy: Secondary | ICD-10-CM

## 2019-06-28 DIAGNOSIS — B0229 Other postherpetic nervous system involvement: Secondary | ICD-10-CM | POA: Insufficient documentation

## 2019-06-28 DIAGNOSIS — Z7189 Other specified counseling: Secondary | ICD-10-CM

## 2019-06-28 DIAGNOSIS — J45909 Unspecified asthma, uncomplicated: Secondary | ICD-10-CM | POA: Insufficient documentation

## 2019-06-28 DIAGNOSIS — Z9119 Patient's noncompliance with other medical treatment and regimen: Secondary | ICD-10-CM | POA: Diagnosis not present

## 2019-06-28 DIAGNOSIS — C8198 Hodgkin lymphoma, unspecified, lymph nodes of multiple sites: Secondary | ICD-10-CM

## 2019-06-28 DIAGNOSIS — Z79899 Other long term (current) drug therapy: Secondary | ICD-10-CM | POA: Diagnosis not present

## 2019-06-28 DIAGNOSIS — I10 Essential (primary) hypertension: Secondary | ICD-10-CM

## 2019-06-28 LAB — CBC WITH DIFFERENTIAL/PLATELET
Abs Immature Granulocytes: 0.01 10*3/uL (ref 0.00–0.07)
Basophils Absolute: 0 10*3/uL (ref 0.0–0.1)
Basophils Relative: 0 %
Eosinophils Absolute: 0.1 10*3/uL (ref 0.0–0.5)
Eosinophils Relative: 2 %
HCT: 33.3 % — ABNORMAL LOW (ref 36.0–46.0)
Hemoglobin: 11.2 g/dL — ABNORMAL LOW (ref 12.0–15.0)
Immature Granulocytes: 0 %
Lymphocytes Relative: 9 %
Lymphs Abs: 0.4 10*3/uL — ABNORMAL LOW (ref 0.7–4.0)
MCH: 32.8 pg (ref 26.0–34.0)
MCHC: 33.6 g/dL (ref 30.0–36.0)
MCV: 97.7 fL (ref 80.0–100.0)
Monocytes Absolute: 0.5 10*3/uL (ref 0.1–1.0)
Monocytes Relative: 11 %
Neutro Abs: 3.5 10*3/uL (ref 1.7–7.7)
Neutrophils Relative %: 78 %
Platelets: 204 10*3/uL (ref 150–400)
RBC: 3.41 MIL/uL — ABNORMAL LOW (ref 3.87–5.11)
RDW: 13 % (ref 11.5–15.5)
WBC: 4.6 10*3/uL (ref 4.0–10.5)
nRBC: 0 % (ref 0.0–0.2)

## 2019-06-28 LAB — CMP (CANCER CENTER ONLY)
ALT: 25 U/L (ref 0–44)
AST: 33 U/L (ref 15–41)
Albumin: 3.9 g/dL (ref 3.5–5.0)
Alkaline Phosphatase: 86 U/L (ref 38–126)
Anion gap: 14 (ref 5–15)
BUN: 12 mg/dL (ref 6–20)
CO2: 23 mmol/L (ref 22–32)
Calcium: 8.8 mg/dL — ABNORMAL LOW (ref 8.9–10.3)
Chloride: 103 mmol/L (ref 98–111)
Creatinine: 1.01 mg/dL — ABNORMAL HIGH (ref 0.44–1.00)
GFR, Est AFR Am: >60 mL/min (ref >60)
GFR, Estimated: >60 mL/min (ref >60)
Glucose, Bld: 66 mg/dL — ABNORMAL LOW (ref 70–99)
Potassium: 4 mmol/L (ref 3.5–5.1)
Sodium: 140 mmol/L (ref 135–145)
Total Bilirubin: 0.3 mg/dL (ref 0.3–1.2)
Total Protein: 7.6 g/dL (ref 6.5–8.1)

## 2019-06-28 LAB — TSH: TSH: 1.229 u[IU]/mL (ref 0.308–3.960)

## 2019-06-28 LAB — SEDIMENTATION RATE: Sed Rate: 7 mm/hr (ref 0–22)

## 2019-06-28 MED ORDER — ACETAMINOPHEN 325 MG PO TABS
ORAL_TABLET | ORAL | Status: AC
  Filled 2019-06-28: qty 2

## 2019-06-28 MED ORDER — ACETAMINOPHEN 325 MG PO TABS
650.0000 mg | ORAL_TABLET | Freq: Once | ORAL | Status: AC
  Administered 2019-06-28: 650 mg via ORAL

## 2019-06-28 MED ORDER — SODIUM CHLORIDE 0.9% FLUSH
10.0000 mL | INTRAVENOUS | Status: DC | PRN
  Administered 2019-06-28: 10 mL
  Filled 2019-06-28: qty 10

## 2019-06-28 MED ORDER — DIPHENHYDRAMINE HCL 25 MG PO CAPS
ORAL_CAPSULE | ORAL | Status: AC
  Filled 2019-06-28: qty 1

## 2019-06-28 MED ORDER — SODIUM CHLORIDE 0.9 % IV SOLN
Freq: Once | INTRAVENOUS | Status: AC
  Administered 2019-06-28: 16:00:00 via INTRAVENOUS
  Filled 2019-06-28: qty 250

## 2019-06-28 MED ORDER — DIPHENHYDRAMINE HCL 25 MG PO TABS
25.0000 mg | ORAL_TABLET | Freq: Once | ORAL | Status: AC
  Administered 2019-06-28: 16:00:00 25 mg via ORAL
  Filled 2019-06-28: qty 1

## 2019-06-28 MED ORDER — SODIUM CHLORIDE 0.9 % IV SOLN
200.0000 mg | Freq: Once | INTRAVENOUS | Status: AC
  Administered 2019-06-28: 17:00:00 200 mg via INTRAVENOUS
  Filled 2019-06-28: qty 8

## 2019-06-28 MED ORDER — HEPARIN SOD (PORK) LOCK FLUSH 100 UNIT/ML IV SOLN
500.0000 [IU] | Freq: Once | INTRAVENOUS | Status: AC | PRN
  Administered 2019-06-28: 500 [IU]
  Filled 2019-06-28: qty 5

## 2019-06-28 MED ORDER — SODIUM CHLORIDE 0.9% FLUSH
10.0000 mL | INTRAVENOUS | Status: DC | PRN
  Administered 2019-06-28: 17:00:00 10 mL
  Filled 2019-06-28: qty 10

## 2019-06-28 MED ORDER — FAMOTIDINE 20 MG PO TABS
ORAL_TABLET | ORAL | Status: AC
  Filled 2019-06-28: qty 2

## 2019-06-28 MED ORDER — FAMOTIDINE 20 MG PO TABS
40.0000 mg | ORAL_TABLET | Freq: Once | ORAL | Status: AC
  Administered 2019-06-28: 16:00:00 40 mg via ORAL

## 2019-06-28 NOTE — Progress Notes (Signed)
HEMATOLOGY ONCOLOGY PROGRESS NOTE  Date of service:   06/28/19     Patient Care Team: Clinic, General Medical as PCP - General (Family Medicine)  Chief complaint: Follow-up for Hodgkin's lymphoma  Diagnosis:   Refractory Mixed cellularity Hodgkin's lymphoma IVBE with extensive lymphadenopathy including right axillary, mediastinal and upper retroperitoneal and now biopsy proven pulmonary involvement. She was noted to have significant constitutional symptoms including significant weight loss, fevers chills and some night sweats.  Current Treatment: Third line therapy ICE for 2 planned cycles.  She was lost to f/u for >38yr  Previous treatment 5 cycles of AVD (without bleomycin due to lung involvement and DLCO of 37%, active smoker) Multiple avoidable treatment delays due to the patient's noncompliance with follow-up for avoidable reasons. She has been counseled repeatedly that this would increase the likelihood of unfavorable outcome.  2nd line therapy with Bendamustine + Brentuximab s/p 7 cycles.  3rd line therapy with ICE s/p 2 cycles   INTERVAL HISTORY:  Heidi [name] is here for follow-up for her Hodgkins lymphoma for C6 Pembrolizumab treatment. The patient's last visit with [place] was on 05/29/19, and missed a follow up in the interim. The pt reports that she is doing well overall.  The pt reports that she has not developed any new concerns in the interim. She notes that she has enjoyed good energy levels, is breathing well and is eating well. She denies any concerns for infections at this time.   Of note since the patient's last visit, pt has had a PET/CT completed on 06/13/19 with results revealing "No abnormal hypermetabolism (Deauville 1). 2. Mixed lytic and sclerotic osseous lesions appear more prominent than on 12/06/2018 but do not have associated hypermetabolism, indicative of interval healing."  Lab results today (06/28/19) of CBC w/diffis as follows: all values are WNL except  for RBC at 3.41, HGB at 11.2, HCT at 33.3, Lymphs abs at 400. 06/28/19 Sed Rate, TSH and CMP are pending  On review of systems, pt reports good energy levels, breathing well, eating well, and denies SOB, leg swelling, abdominal pains, concerns for infections, diarrhea, and any other symptoms.    REVIEW OF SYSTEMS:    A 10+ POINT REVIEW OF SYSTEMS WAS OBTAINED including neurology, dermatology, psychiatry, cardiac, respiratory, lymph, extremities, GI, GU, Musculoskeletal, constitutional, breasts, reproductive, HEENT.  All pertinent positives are noted in the HPI.  All others are negative.  . Past Medical History:  Diagnosis Date  . ARDS (adult respiratory distress syndrome) ([place])   . Asthma   . Hodgkin lymphoma ([place])   . Hypertension     . Past Surgical History:  Procedure Laterality Date  . AXILLARY LYMPH NODE BIOPSY Right 03/19/2016   Procedure: AXILLARY LYMPH NODE BIOPSY;  Surgeon: Armandina Gemma, MD;  Location: [facility];  Service: General;  Laterality: Right;  . IR GENERIC HISTORICAL  12/22/2016   IR FLUORO GUIDE PORT INSERTION LEFT 12/22/2016 [facility]  . IR GENERIC HISTORICAL  12/22/2016   IR US GUIDE VASC ACCESS LEFT 12/22/2016 [facility]  . IR GENERIC HISTORICAL  12/22/2016   IR CV LINE INJECTION 12/22/2016 [facility]  . IR GENERIC HISTORICAL  12/22/2016   IR REMOVAL TUN ACCESS W/ PORT W/O FL MOD SED 12/22/2016 [facility]  . VIDEO BRONCHOSCOPY Bilateral 11/26/2016   Procedure: VIDEO BRONCHOSCOPY WITH FLUORO;  Surgeon: Rigoberto Noel, MD;  Location: [facility];  Service: Cardiopulmonary;  Laterality: Bilateral;    . Social History   Tobacco Use  . Smoking status:  Former Smoker    Packs/day: 0.50    Years: 15.00    Pack years: 7.50    Types: Cigarettes    Quit date: 03/27/2016    Years since quitting: 3.2  . Smokeless tobacco: Never Used  Substance Use Topics  . Alcohol use: Yes    Comment: occasional now  . Drug use: Not on file    ALLERGIES:  has No Known  Allergies.  MEDICATIONS:  Current Outpatient Medications  Medication Sig Dispense Refill  . acyclovir (ZOVIRAX) 400 MG tablet Take 1 tablet (400 mg total) by mouth 2 (two) times daily. Start after completion of Valtrex 60 tablet 6  . amitriptyline (ELAVIL) 10 MG tablet Take 1 tablet (10 mg total) by mouth at bedtime. (Patient not taking: Reported on 11/14/2018) 20 tablet 0  . gabapentin (NEURONTIN) 300 MG capsule Take 1 capsule (300 mg total) by mouth 2 (two) times daily. 60 capsule 0  . oxyCODONE-acetaminophen (PERCOCET/ROXICET) 5-325 MG tablet Take 1-2 tablets by mouth every 6 (six) hours as needed for severe pain. 50 tablet 0   No current facility-administered medications for this visit.    Facility-Administered Medications Ordered in Other Visits  Medication Dose Route Frequency Provider Last Rate Last Dose  . sodium chloride flush (NS) 0.9 % injection 10 mL  10 mL Intracatheter PRN Brunetta Genera, MD        PHYSICAL EXAMINATION: ECOG PERFORMANCE STATUS: 1 - Symptomatic but completely ambulatory  There were no vitals filed for this visit.  VS reviewed  GENERAL:alert, in no acute distress and comfortable SKIN: no acute rashes, no significant lesions EYES: conjunctiva are pink and non-injected, sclera anicteric OROPHARYNX: MMM, no exudates, no oropharyngeal erythema or ulceration NECK: supple, no JVD LYMPH:  no palpable lymphadenopathy in the cervical, axillary or inguinal regions LUNGS: clear to auscultation b/l with normal respiratory effort HEART: regular rate & rhythm ABDOMEN:  normoactive bowel sounds , non tender, not distended. No palpable hepatosplenomegaly.  Extremity: no pedal edema PSYCH: alert & oriented x 3 with fluent speech NEURO: no focal motor/sensory deficits    LABORATORY DATA:   I have reviewed the data as listed  . CBC Latest Ref Rng & Units 06/28/2019 05/29/2019 05/07/2019  WBC 4.0 - 10.5 K/uL 4.6 3.9(L) 6.4  Hemoglobin 12.0 - 15.0 g/dL 11.2(L)  11.7(L) 11.4(L)  Hematocrit 36.0 - 46.0 % 33.3(L) 35.7(L) 34.4(L)  Platelets 150 - 400 K/uL 204 267 243   . CBC    Component Value Date/Time   WBC 4.6 06/28/2019 1436   RBC 3.41 (L) 06/28/2019 1436   HGB 11.2 (L) 06/28/2019 1436   HGB 10.6 (L) 11/07/2018 1047   HGB 11.7 07/20/2017 1315   HCT 33.3 (L) 06/28/2019 1436   HCT 35.2 07/20/2017 1315   PLT 204 06/28/2019 1436   PLT 473 (H) 11/07/2018 1047   PLT 266 07/20/2017 1315   MCV 97.7 06/28/2019 1436   MCV 93.2 07/20/2017 1315   MCH 32.8 06/28/2019 1436   MCHC 33.6 06/28/2019 1436   RDW 13.0 06/28/2019 1436   RDW 14.5 07/20/2017 1315   LYMPHSABS 0.4 (L) 06/28/2019 1436   LYMPHSABS 0.3 (L) 07/20/2017 1315   MONOABS 0.5 06/28/2019 1436   MONOABS 0.5 07/20/2017 1315   EOSABS 0.1 06/28/2019 1436   EOSABS 0.2 07/20/2017 1315   BASOSABS 0.0 06/28/2019 1436   BASOSABS 0.0 07/20/2017 1315   . CMP Latest Ref Rng & Units 05/29/2019 05/07/2019 04/10/2019  Glucose 70 - 99 mg/dL 75 79 78  BUN 6 - 20 mg/dL 8 14 12   Creatinine 0.44 - 1.00 mg/dL 0.99 0.96 0.76  Sodium 135 - 145 mmol/L 139 138 137  Potassium 3.5 - 5.1 mmol/L 3.9 4.3 4.1  Chloride 98 - 111 mmol/L 102 103 101  CO2 22 - 32 mmol/L 26 26 24   Calcium 8.9 - 10.3 mg/dL 9.0 8.8(L) 8.8(L)  Total Protein 6.5 - 8.1 g/dL 7.9 7.6 7.4  Total Bilirubin 0.3 - 1.2 mg/dL 0.4 0.3 0.5  Alkaline Phos 38 - 126 U/L 106 114 109  AST 15 - 41 U/L 29 31 15   ALT 0 - 44 U/L 16 19 6      RADIOGRAPHIC STUDIES: I have personally reviewed the radiological images as listed and agreed with the findings in the report. Nm Pet Image Restag (ps) Skull Base To Thigh  Result Date: 06/13/2019 CLINICAL DATA:  Subsequent treatment strategy for Hodgkin lymphoma. EXAM: NUCLEAR MEDICINE PET SKULL BASE TO THIGH TECHNIQUE: 7.1 mCi F-18 FDG was injected intravenously. Full-ring PET imaging was performed from the skull base to thigh after the radiotracer. CT data was obtained and used for attenuation correction and  anatomic localization. Fasting blood glucose: 77 mg/dl COMPARISON:  12/06/2018. FINDINGS: Mediastinal blood pool activity: SUV max 2.6 Liver activity: SUV max 3.2 NECK: No hypermetabolic lymph nodes. Incidental CT findings: None. CHEST: No hypermetabolic mediastinal, hilar or axillary lymph nodes. No hypermetabolic pulmonary nodules. Incidental CT findings: Left IJ Port-A-Cath terminates in the low SVC. Heart size within normal limits. No pericardial or pleural effusion. Post treatment scarring in the right hemithorax. ABDOMEN/PELVIS: No abnormal hypermetabolism in the liver, adrenal glands, spleen or pancreas. Splenic metabolism has an SUV max of 2.6. No hypermetabolic lymph nodes. Incidental CT findings: Liver, gallbladder, adrenal glands, kidneys, spleen, pancreas, stomach and bowel are grossly unremarkable. SKELETON: Mixed lytic and sclerotic lesions appear more prominent than on the prior exam but do not show associated hypermetabolism, indicative of healing. Incidental CT findings: Pectus excavatum. IMPRESSION: 1. No abnormal hypermetabolism (Deauville 1). 2. Mixed lytic and sclerotic osseous lesions appear more prominent than on 12/06/2018 but do not have associated hypermetabolism, indicative of interval healing. Electronically Signed   By: Lorin Picket M.D.   On: 06/13/2019 11:51    ASSESSMENT & PLAN:   33 y.o. 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 She was noted to have significant constitutional symptoms including significant weight loss, fevers chills and some night sweats. HIV negative Hepatitis C and hepatitis B serologies negative. Echo with normal ejection fraction. Patient was treated with 5 cycles of AVD (Bleomycin held due to poor DLCO 37% and ongoing smoking). Multiple avoidable  treatment delays due to the patient's noncompliance with follow-up for avoidable reasons. She has been counseled repeatedly that this would increase the likelihood of unfavorable outcome.  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 S/p 6 cycles of 2nd line treatment with Bendamustine/Brentuximab And 1 cycle of Bretuximab alone  -PET/CT scan results from 04/14/2017 were discussed in details. She appears to have some persistent disease in her right lung at Deauville 5. It is difficult to say if this is recurrent or persistent disease since the patient failed to follow-up on multiple scheduled PET/CT scans in the early part and prior to her second line treatment.  Patient was lost to followup for >1 yr  09/26/18 PET/CT revealed Imaging findings compatible with recurrence  of disease. 2. Multiple new large areas of hypermetabolic nodularity and airspace consolidation within both lungs which is presumed to represent pulmonary involvement by lymphoma. Deauville criteria 5. 3. New hypermetabolic left supraclavicular, left retroperitoneal, and bilateral pelvic lymph nodes. Deauville criteria 5. 4. Multifocal hypermetabolic osseous lesions. Deauville criteria 5. 5. Small volume of ascites, new.   12/06/18 PET/CT revealed Generally improved appearance with previously mostly Deauville 5 activity now mostly Deauville 4 activity. 2. Layering of much of the airspace opacity in the lungs, although considerable right perihilar airspace opacity remains. The remaining pulmonary opacities are assess this Deauville 5 on the right and over L4 on the left, and were previously Deauville 5 bilaterally. 3. Mildly reduced size and moderately reduced activity in the abdominopelvic lymph nodes which are now dove L4. 4. The previously seen left supraclavicular lymph node seems to have completely resolved (Deauville 0). 5. The skeletal lesions remain at Deauville 4, although in absolute terms have  decreased in SUV compared to previous. 6. No new regions of malignant involvement compared to prior exam. 7. Other imaging findings of potential clinical significance: Low-density blood pool suggests anemia. Pectus excavatum. Volume loss in the right hemithorax.  #2 s/p hypoxic respiratory failurewith dense right lung consolidation and left upper lobe consolidation with SVC syndrome. Patient has completed palliative radiation to the right lung mass causing SVC compression.  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  #4 Non compliancewith clinic and treatment followup. Missed 2nd dose of bendamustine with C2. Has missed multiple appointment for her PET/CT and missed appointment at [facility] for consideration of Transplant.  Pt was lost to follow up after July 2018 and returned on 08/31/18 Discussed the patient's goals of care and the pt noted that she is ready to begin treatment again and maintain compliance and follow ups   #5 h/o Grade 1 neuropathyfrom Brentuximab-Vedotin - resolved  #6 Shingles outbreak - curentlyresolved but with significant post herpetic neuralgia  #7 Significant anemia and thrombocytopenia after C1 of ICE -- will need close monitoring.  #8 DVT Prophylaxis - lovenox  PLAN: -Discussed pt labwork today, 06/28/19; blood counts are stable -06/28/19 Sed Rate is 7 Last available 05/29/19 Sed Rate normalized to 15 -Discussed the 06/13/19 PET/CT which revealed "No abnormal hypermetabolism (Deauville 1). 2. Mixed lytic and sclerotic osseous lesions appear more prominent than on 12/06/2018 but do not have associated hypermetabolism, indicative of interval healing."  -Discussed the two options for the pt at this time as either proceeding with maintenance Pembrolizumab until progression vs proceeding  towards autologous bone marrow transplant. Discussed that I recommend considering transplant vs CAR T-cell therapy. Discussed that as the pt is now in metabolic remission, now would be an ideal time to consider more definitive treatment with curative intent with transplant vs CAR T-Cell therapy. -The pt notes that she would like to consider these options before I refer her to an academic center. She will let me know her decision when she returns for her next cycle of Pembrolizumab. -The pt has no prohibitive toxicities from continuing C7 Pembrolizumab at this time. -Continue Acyclovirfor shingles prophylaxis -Will see the pt back in 3 weeks with Pembrolizumab   The total time spent in the appt was 25 minutes and more than 50% was on counseling and direct patient cares.    Sullivan Lone MD [place] AAHIVMS [facility]  [facility] Hematology/Oncology Physician [facility]  (Office):       [phone] (Work cell):  [phone] (Fax):           [phone]  I, Baldwin [place], am acting as a scribe for Dr. Sullivan Lone.   .I have reviewed the above documentation for accuracy and completeness, and I agree with the above. Brunetta Genera MD

## 2019-06-28 NOTE — Patient Instructions (Signed)
[facility] Discharge Instructions for Patients Receiving Chemotherapy  Today you received the following chemotherapy agents:  Keytruda.  To help prevent nausea and vomiting after your treatment, we encourage you to take your nausea medication as directed.   If you develop nausea and vomiting that is not controlled by your nausea medication, call the clinic.   BELOW ARE SYMPTOMS THAT SHOULD BE REPORTED IMMEDIATELY:  *FEVER GREATER THAN 100.5 F  *CHILLS WITH OR WITHOUT FEVER  NAUSEA AND VOMITING THAT IS NOT CONTROLLED WITH YOUR NAUSEA MEDICATION  *UNUSUAL SHORTNESS OF BREATH  *UNUSUAL BRUISING OR BLEEDING  TENDERNESS IN MOUTH AND THROAT WITH OR WITHOUT PRESENCE OF ULCERS  *URINARY PROBLEMS  *BOWEL PROBLEMS  UNUSUAL RASH Items with * indicate a potential emergency and should be followed up as soon as possible.  Feel free to call the clinic should you have any questions or concerns. The clinic phone number is [phone].  Please show the CHEMO ALERT CARD at check-in to the Emergency Department and triage nurse.    

## 2019-07-02 ENCOUNTER — Telehealth: Payer: Self-pay | Admitting: Hematology

## 2019-07-02 NOTE — Telephone Encounter (Signed)
Scheduled appt per 7/2 los. Spoke with patient and she is aware of her appt date and time. °

## 2019-07-09 ENCOUNTER — Other Ambulatory Visit: Payer: Medicaid Other

## 2019-07-09 ENCOUNTER — Ambulatory Visit: Payer: Medicaid Other

## 2019-07-09 ENCOUNTER — Ambulatory Visit: Payer: Medicaid Other | Admitting: Hematology

## 2019-07-19 ENCOUNTER — Inpatient Hospital Stay: Payer: Medicaid Other

## 2019-07-19 ENCOUNTER — Telehealth: Payer: Self-pay | Admitting: *Deleted

## 2019-07-19 ENCOUNTER — Inpatient Hospital Stay: Payer: Medicaid Other | Admitting: Hematology

## 2019-07-19 NOTE — Telephone Encounter (Signed)
Contacted patient after noting she had missed lab/flush appt this morning. Patient also has MD and infusion appt today. Patient answered and stated the appts had slipped her mind. Per Dr. Irene Limbo, offer infusion this afternoon if patient able to come. Patient unable to come today due to lack of child care. Informed patient that schedule message will be sent to schedule for first available appt. Patient verbalized understanding.

## 2019-07-20 ENCOUNTER — Telehealth: Payer: Self-pay | Admitting: Hematology

## 2019-07-20 NOTE — Telephone Encounter (Signed)
Scheduled appt per 7/23 sch message - spoke with patient and she is aware of appt on 7/27

## 2019-07-21 NOTE — Progress Notes (Signed)
HEMATOLOGY ONCOLOGY PROGRESS NOTE  Date of service:   07/23/19     Patient Care Team: Clinic, General Medical as PCP - General (Family Medicine)  Chief complaint: Follow-up for Hodgkin's lymphoma  Diagnosis:   Refractory Mixed cellularity Hodgkin's lymphoma IVBE with extensive lymphadenopathy including right axillary, mediastinal and upper retroperitoneal and now biopsy proven pulmonary involvement. She was noted to have significant constitutional symptoms including significant weight loss, fevers chills and some night sweats.  Current Treatment: Third line therapy ICE for 2 planned cycles.  She was lost to f/u for >82yr  Previous treatment 5 cycles of AVD (without bleomycin due to lung involvement and DLCO of 37%, active smoker) Multiple avoidable treatment delays due to the patient's noncompliance with follow-up for avoidable reasons. She has been counseled repeatedly that this would increase the likelihood of unfavorable outcome.  2nd line therapy with Bendamustine + Brentuximab s/p 7 cycles.  3rd line therapy with ICE s/p 2 cycles   INTERVAL HISTORY:  [name] is here for follow-up for her Hodgkins lymphoma for C8D1 Pembrolizumab treatment.The patient's last visit with [place] was on 06/28/2019. The pt reports that she is doing well overall. Treatment was delayed due to a missed appointment on 07/19/2019.  The pt reports no new concerns. Denies fevers, chills, skin rashes, diarrhea, breathing problems. She reports that she is staying indoors. Pt has given thought to getting a transplant, but is unsure if that is what she wants.  Lab results today (07/23/2019) of CBC w/diff and CMP is as follows: all values are WNL except for WBC at 3.8k, RBC at 3.58, HGB at 11.7, HCT at 35.0. 07/23/2019 CMP show some AST and ALT elevation 07/23/2019 sed rate is pending  On review of systems, pt reports no new concerns and denies fevers, problems with her port, chills, skin rashes, diarrhea,  breathing problems, belly pain, leg swelling, and any other symptoms.   REVIEW OF SYSTEMS:    A 10+ POINT REVIEW OF SYSTEMS WAS OBTAINED including neurology, dermatology, psychiatry, cardiac, respiratory, lymph, extremities, GI, GU, Musculoskeletal, constitutional, breasts, reproductive, HEENT.  All pertinent positives are noted in the HPI.  All others are negative.  . Past Medical History:  Diagnosis Date  . ARDS (adult respiratory distress syndrome) ([place])   . Asthma   . Hodgkin lymphoma ([place])   . Hypertension     . Past Surgical History:  Procedure Laterality Date  . AXILLARY LYMPH NODE BIOPSY Right 03/19/2016   Procedure: AXILLARY LYMPH NODE BIOPSY;  Surgeon: Armandina Gemma, MD;  Location: [facility];  Service: General;  Laterality: Right;  . IR GENERIC HISTORICAL  12/22/2016   IR FLUORO GUIDE PORT INSERTION LEFT 12/22/2016 [facility]  . IR GENERIC HISTORICAL  12/22/2016   IR US GUIDE VASC ACCESS LEFT 12/22/2016 [facility]  . IR GENERIC HISTORICAL  12/22/2016   IR CV LINE INJECTION 12/22/2016 [facility]  . IR GENERIC HISTORICAL  12/22/2016   IR REMOVAL TUN ACCESS W/ PORT W/O FL MOD SED 12/22/2016 [facility]  . VIDEO BRONCHOSCOPY Bilateral 11/26/2016   Procedure: VIDEO BRONCHOSCOPY WITH FLUORO;  Surgeon: Rigoberto Noel, MD;  Location: [facility];  Service: Cardiopulmonary;  Laterality: Bilateral;    . Social History   Tobacco Use  . Smoking status: Former Smoker    Packs/day: 0.50    Years: 15.00    Pack years: 7.50    Types: Cigarettes    Quit date: 03/27/2016    Years since quitting: 3.3  . Smokeless tobacco:  Never Used  Substance Use Topics  . Alcohol use: Yes    Comment: occasional now  . Drug use: Not on file    ALLERGIES:  has No Known Allergies.  MEDICATIONS:  Current Outpatient Medications  Medication Sig Dispense Refill  . acyclovir (ZOVIRAX) 400 MG tablet Take 1 tablet (400 mg total) by mouth 2 (two) times daily. Start after completion of Valtrex 60  tablet 6  . amitriptyline (ELAVIL) 10 MG tablet Take 1 tablet (10 mg total) by mouth at bedtime. (Patient not taking: Reported on 11/14/2018) 20 tablet 0  . gabapentin (NEURONTIN) 300 MG capsule Take 1 capsule (300 mg total) by mouth 2 (two) times daily. 60 capsule 0  . oxyCODONE-acetaminophen (PERCOCET/ROXICET) 5-325 MG tablet Take 1-2 tablets by mouth every 6 (six) hours as needed for severe pain. 50 tablet 0   No current facility-administered medications for this visit.    Facility-Administered Medications Ordered in Other Visits  Medication Dose Route Frequency Provider Last Rate Last Dose  . sodium chloride flush (NS) 0.9 % injection 10 mL  10 mL Intracatheter PRN Brunetta Genera, MD        PHYSICAL EXAMINATION: ECOG PERFORMANCE STATUS: 1 - Symptomatic but completely ambulatory  Vitals:   07/23/19 1107  BP: 118/78  Pulse: 95  Resp: 18  Temp: (!) 97.4 F (36.3 C)  SpO2: 98%    VS reviewed  GENERAL:alert, in no acute distress and comfortable SKIN: no acute rashes, no significant lesions EYES: conjunctiva are pink and non-injected, sclera anicteric OROPHARYNX: MMM, no exudates, no oropharyngeal erythema or ulceration NECK: supple, no JVD LYMPH:  no palpable lymphadenopathy in the cervical, axillary or inguinal regions LUNGS: clear to auscultation b/l with normal respiratory effort HEART: regular rate & rhythm ABDOMEN:  normoactive bowel sounds , non tender, not distended. No palpable hepatosplenomegaly.  Extremity: no pedal edema PSYCH: alert & oriented x 3 with fluent speech NEURO: no focal motor/sensory deficits    LABORATORY DATA:   I have reviewed the data as listed  . CBC Latest Ref Rng & Units 07/23/2019 06/28/2019 05/29/2019  WBC 4.0 - 10.5 K/uL 3.8(L) 4.6 3.9(L)  Hemoglobin 12.0 - 15.0 g/dL 11.7(L) 11.2(L) 11.7(L)  Hematocrit 36.0 - 46.0 % 35.0(L) 33.3(L) 35.7(L)  Platelets 150 - 400 K/uL 202 204 267   . CBC    Component Value Date/Time   WBC 3.8 (L)  07/23/2019 1049   RBC 3.58 (L) 07/23/2019 1049   HGB 11.7 (L) 07/23/2019 1049   HGB 10.6 (L) 11/07/2018 1047   HGB 11.7 07/20/2017 1315   HCT 35.0 (L) 07/23/2019 1049   HCT 35.2 07/20/2017 1315   PLT 202 07/23/2019 1049   PLT 473 (H) 11/07/2018 1047   PLT 266 07/20/2017 1315   MCV 97.8 07/23/2019 1049   MCV 93.2 07/20/2017 1315   MCH 32.7 07/23/2019 1049   MCHC 33.4 07/23/2019 1049   RDW 12.4 07/23/2019 1049   RDW 14.5 07/20/2017 1315   LYMPHSABS 0.8 07/23/2019 1049   LYMPHSABS 0.3 (L) 07/20/2017 1315   MONOABS 0.7 07/23/2019 1049   MONOABS 0.5 07/20/2017 1315   EOSABS 0.1 07/23/2019 1049   EOSABS 0.2 07/20/2017 1315   BASOSABS 0.0 07/23/2019 1049   BASOSABS 0.0 07/20/2017 1315   . CMP Latest Ref Rng & Units 07/23/2019 06/28/2019 05/29/2019  Glucose 70 - 99 mg/dL 67(L) 66(L) 75  BUN 6 - 20 mg/dL 10 12 8   Creatinine 0.44 - 1.00 mg/dL 0.88 1.01(H) 0.99  Sodium 135 -  145 mmol/L 137 140 139  Potassium 3.5 - 5.1 mmol/L 4.4 4.0 3.9  Chloride 98 - 111 mmol/L 102 103 102  CO2 22 - 32 mmol/L 26 23 26   Calcium 8.9 - 10.3 mg/dL 9.1 8.8(L) 9.0  Total Protein 6.5 - 8.1 g/dL 7.1 7.6 7.9  Total Bilirubin 0.3 - 1.2 mg/dL 0.6 0.3 0.4  Alkaline Phos 38 - 126 U/L 88 86 106  AST 15 - 41 U/L 128(H) 33 29  ALT 0 - 44 U/L 92(H) 25 16     RADIOGRAPHIC STUDIES: I have personally reviewed the radiological images as listed and agreed with the findings in the report. No results found.  ASSESSMENT & PLAN:   33 y.o. 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 She was noted to have significant constitutional symptoms including significant weight loss, fevers chills and some night sweats. HIV negative Hepatitis C and hepatitis B serologies negative. Echo with normal ejection fraction. Patient was treated  with 5 cycles of AVD (Bleomycin held due to poor DLCO 37% and ongoing smoking). Multiple avoidable treatment delays due to the patient's noncompliance with follow-up for avoidable reasons. She has been counseled repeatedly that this would increase the likelihood of unfavorable outcome.  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 S/p 6 cycles of 2nd line treatment with Bendamustine/Brentuximab And 1 cycle of Bretuximab alone  -PET/CT scan results from 04/14/2017 were discussed in details. She appears to have some persistent disease in her right lung at Deauville 5. It is difficult to say if this is recurrent or persistent disease since the patient failed to follow-up on multiple scheduled PET/CT scans in the early part and prior to her second line treatment.  Patient was lost to followup for >1 yr  09/26/18 PET/CT revealed Imaging findings compatible with recurrence of disease. 2. Multiple new large areas of hypermetabolic nodularity and airspace consolidation within both lungs which is presumed to represent pulmonary involvement by lymphoma. Deauville criteria 5. 3. New hypermetabolic left supraclavicular, left retroperitoneal, and bilateral pelvic lymph nodes. Deauville criteria 5. 4. Multifocal hypermetabolic osseous lesions. Deauville criteria 5. 5. Small volume of ascites, new.   12/06/18 PET/CT revealed Generally improved appearance with previously mostly Deauville 5 activity now mostly Deauville 4 activity. 2. Layering of much of the airspace opacity in the lungs, although considerable right perihilar airspace opacity remains. The remaining pulmonary opacities are assess this Deauville 5 on the right and over L4 on the left, and were previously Deauville 5 bilaterally. 3. Mildly reduced size and moderately reduced activity in the abdominopelvic lymph nodes which are now dove L4. 4. The previously seen left supraclavicular lymph node seems to have completely  resolved (Deauville 0). 5. The skeletal lesions remain at Deauville 4, although in absolute terms have decreased in SUV compared to previous. 6. No new regions of malignant involvement compared to prior exam. 7. Other imaging findings of potential clinical significance: Low-density blood pool suggests anemia. Pectus excavatum. Volume loss in the right hemithorax.  06/13/19 PET/CT revealed "No abnormal hypermetabolism (Deauville 1). 2. Mixed lytic and sclerotic osseous lesions appear more prominent than on 12/06/2018 but do not have associated hypermetabolism, indicative of interval healing."  #2 s/p hypoxic respiratory failurewith dense right lung consolidation and left upper lobe consolidation with SVC syndrome. Patient has completed palliative radiation to the right lung mass causing SVC compression.  11/22/18  PFT which revealed some improvement in DLCO, however some element of restriction and obstruction is noted   #3 previous h/o SVC syndrome- right facial and right upper extremity swelling -resolved.  #4 Non compliancewith clinic and treatment followup. Missed 2nd dose of bendamustine with C2. Has missed multiple appointment for her PET/CT and missed appointment at [facility] for consideration of Transplant.  Pt was lost to follow up after July 2018 and returned on 08/31/18 Discussed the patient's goals of care and the pt noted that she is ready to begin treatment again and maintain compliance and follow ups   #5 h/o Grade 1 neuropathyfrom Brentuximab-Vedotin - resolved  #6 Shingles outbreak - curentlyresolved but with significant post herpetic neuralgia  #7 Significant anemia and thrombocytopenia after C1 of ICE -- will need close monitoring.  #8 DVT Prophylaxis - lovenox  PLAN: -Discussed pt labwork today, 07/23/2019; blood counts are stable -Discussed again the two options for the pt at this time as either proceeding with maintenance Pembrolizumab until  progression vs proceeding towards autologous bone marrow transplant. Discussed that I recommend considering transplant vs CAR T-cell therapy. Discussed that as the pt is now in metabolic remission, now would be an ideal time to consider more definitive treatment with curative intent with transplant vs CAR T-Cell therapy. Pt voiced understanding. -Pt is not interested in a transplant or CAR T-Cell therapy at this time and would like to continue with maintenance Pembrolizumab.  -The pt has no prohibitive toxicities from continuing C8D1 Pembrolizumab at this time. Will monitor LFTs which are a little elevated today. -Continue Acyclovirfor shingles prophylaxis -Will see the pt back in 3 weeks   Please schedule next 2 cycles of Pembrolizumab q3weeks with labs and MD visit   The total time spent in the appt was 20 minutes and more than 50% was on counseling and direct patient cares.    Sullivan Lone MD [place] AAHIVMS [facility] Hematology/Oncology Physician [facility]  (Office):       [phone] (Work cell):  [phone] (Fax):           [phone]  I, De Burrs, am acting as a scribe for Dr. Irene Limbo  .[name] KitchenMarland KitchenI have reviewed the above documentation for accuracy and completeness, and I agree with the above.  Brunetta Genera MD

## 2019-07-23 ENCOUNTER — Telehealth: Payer: Self-pay | Admitting: Hematology

## 2019-07-23 ENCOUNTER — Inpatient Hospital Stay: Payer: Medicaid Other

## 2019-07-23 ENCOUNTER — Other Ambulatory Visit: Payer: Self-pay

## 2019-07-23 ENCOUNTER — Inpatient Hospital Stay (HOSPITAL_BASED_OUTPATIENT_CLINIC_OR_DEPARTMENT_OTHER): Payer: Medicaid Other | Admitting: Hematology

## 2019-07-23 VITALS — BP 118/78 | HR 95 | Temp 97.4°F | Resp 18 | Ht 67.0 in | Wt 144.3 lb

## 2019-07-23 DIAGNOSIS — B0229 Other postherpetic nervous system involvement: Secondary | ICD-10-CM

## 2019-07-23 DIAGNOSIS — C8198 Hodgkin lymphoma, unspecified, lymph nodes of multiple sites: Secondary | ICD-10-CM

## 2019-07-23 DIAGNOSIS — Z5112 Encounter for antineoplastic immunotherapy: Secondary | ICD-10-CM | POA: Diagnosis not present

## 2019-07-23 DIAGNOSIS — Z95828 Presence of other vascular implants and grafts: Secondary | ICD-10-CM

## 2019-07-23 DIAGNOSIS — C8128 Mixed cellularity classical Hodgkin lymphoma, lymph nodes of multiple sites: Secondary | ICD-10-CM | POA: Diagnosis not present

## 2019-07-23 DIAGNOSIS — Z9114 Patient's other noncompliance with medication regimen: Secondary | ICD-10-CM

## 2019-07-23 DIAGNOSIS — I10 Essential (primary) hypertension: Secondary | ICD-10-CM | POA: Diagnosis not present

## 2019-07-23 DIAGNOSIS — D649 Anemia, unspecified: Secondary | ICD-10-CM

## 2019-07-23 DIAGNOSIS — Z923 Personal history of irradiation: Secondary | ICD-10-CM

## 2019-07-23 DIAGNOSIS — J45909 Unspecified asthma, uncomplicated: Secondary | ICD-10-CM

## 2019-07-23 DIAGNOSIS — Z9119 Patient's noncompliance with other medical treatment and regimen: Secondary | ICD-10-CM

## 2019-07-23 DIAGNOSIS — Z7189 Other specified counseling: Secondary | ICD-10-CM

## 2019-07-23 DIAGNOSIS — Z87891 Personal history of nicotine dependence: Secondary | ICD-10-CM

## 2019-07-23 DIAGNOSIS — Z79899 Other long term (current) drug therapy: Secondary | ICD-10-CM

## 2019-07-23 LAB — CBC WITH DIFFERENTIAL/PLATELET
Abs Immature Granulocytes: 0.01 10*3/uL (ref 0.00–0.07)
Basophils Absolute: 0 10*3/uL (ref 0.0–0.1)
Basophils Relative: 0 %
Eosinophils Absolute: 0.1 10*3/uL (ref 0.0–0.5)
Eosinophils Relative: 2 %
HCT: 35 % — ABNORMAL LOW (ref 36.0–46.0)
Hemoglobin: 11.7 g/dL — ABNORMAL LOW (ref 12.0–15.0)
Immature Granulocytes: 0 %
Lymphocytes Relative: 21 %
Lymphs Abs: 0.8 10*3/uL (ref 0.7–4.0)
MCH: 32.7 pg (ref 26.0–34.0)
MCHC: 33.4 g/dL (ref 30.0–36.0)
MCV: 97.8 fL (ref 80.0–100.0)
Monocytes Absolute: 0.7 10*3/uL (ref 0.1–1.0)
Monocytes Relative: 17 %
Neutro Abs: 2.2 10*3/uL (ref 1.7–7.7)
Neutrophils Relative %: 60 %
Platelets: 202 10*3/uL (ref 150–400)
RBC: 3.58 MIL/uL — ABNORMAL LOW (ref 3.87–5.11)
RDW: 12.4 % (ref 11.5–15.5)
WBC: 3.8 10*3/uL — ABNORMAL LOW (ref 4.0–10.5)
nRBC: 0 % (ref 0.0–0.2)

## 2019-07-23 LAB — CMP (CANCER CENTER ONLY)
ALT: 92 U/L — ABNORMAL HIGH (ref 0–44)
AST: 128 U/L — ABNORMAL HIGH (ref 15–41)
Albumin: 3.9 g/dL (ref 3.5–5.0)
Alkaline Phosphatase: 88 U/L (ref 38–126)
Anion gap: 9 (ref 5–15)
BUN: 10 mg/dL (ref 6–20)
CO2: 26 mmol/L (ref 22–32)
Calcium: 9.1 mg/dL (ref 8.9–10.3)
Chloride: 102 mmol/L (ref 98–111)
Creatinine: 0.88 mg/dL (ref 0.44–1.00)
GFR, Est AFR Am: >60 mL/min (ref >60)
GFR, Estimated: >60 mL/min (ref >60)
Glucose, Bld: 67 mg/dL — ABNORMAL LOW (ref 70–99)
Potassium: 4.4 mmol/L (ref 3.5–5.1)
Sodium: 137 mmol/L (ref 135–145)
Total Bilirubin: 0.6 mg/dL (ref 0.3–1.2)
Total Protein: 7.1 g/dL (ref 6.5–8.1)

## 2019-07-23 LAB — SEDIMENTATION RATE: Sed Rate: 6 mm/hr (ref 0–22)

## 2019-07-23 MED ORDER — DIPHENHYDRAMINE HCL 25 MG PO CAPS
ORAL_CAPSULE | ORAL | Status: AC
  Filled 2019-07-23: qty 1

## 2019-07-23 MED ORDER — DIPHENHYDRAMINE HCL 25 MG PO TABS
25.0000 mg | ORAL_TABLET | Freq: Once | ORAL | Status: AC
  Administered 2019-07-23: 25 mg via ORAL
  Filled 2019-07-23: qty 1

## 2019-07-23 MED ORDER — SODIUM CHLORIDE 0.9 % IV SOLN
200.0000 mg | Freq: Once | INTRAVENOUS | Status: AC
  Administered 2019-07-23: 200 mg via INTRAVENOUS
  Filled 2019-07-23: qty 8

## 2019-07-23 MED ORDER — HEPARIN SOD (PORK) LOCK FLUSH 100 UNIT/ML IV SOLN
500.0000 [IU] | Freq: Once | INTRAVENOUS | Status: AC | PRN
  Administered 2019-07-23: 500 [IU]
  Filled 2019-07-23: qty 5

## 2019-07-23 MED ORDER — FAMOTIDINE 20 MG PO TABS
ORAL_TABLET | ORAL | Status: AC
  Filled 2019-07-23: qty 2

## 2019-07-23 MED ORDER — FAMOTIDINE 20 MG PO TABS
40.0000 mg | ORAL_TABLET | Freq: Once | ORAL | Status: AC
  Administered 2019-07-23: 40 mg via ORAL

## 2019-07-23 MED ORDER — ACETAMINOPHEN 325 MG PO TABS
650.0000 mg | ORAL_TABLET | Freq: Once | ORAL | Status: AC
  Administered 2019-07-23: 650 mg via ORAL

## 2019-07-23 MED ORDER — SODIUM CHLORIDE 0.9% FLUSH
10.0000 mL | INTRAVENOUS | Status: DC | PRN
  Administered 2019-07-23: 11:00:00 10 mL
  Filled 2019-07-23: qty 10

## 2019-07-23 MED ORDER — ACETAMINOPHEN 325 MG PO TABS
ORAL_TABLET | ORAL | Status: AC
  Filled 2019-07-23: qty 2

## 2019-07-23 MED ORDER — SODIUM CHLORIDE 0.9% FLUSH
10.0000 mL | INTRAVENOUS | Status: DC | PRN
  Administered 2019-07-23: 10 mL
  Filled 2019-07-23: qty 10

## 2019-07-23 MED ORDER — SODIUM CHLORIDE 0.9 % IV SOLN
Freq: Once | INTRAVENOUS | Status: AC
  Administered 2019-07-23: 12:00:00 via INTRAVENOUS
  Filled 2019-07-23: qty 250

## 2019-07-23 MED ORDER — FAMOTIDINE 20 MG PO TABS
ORAL_TABLET | ORAL | Status: AC
  Filled 2019-07-23: qty 1

## 2019-07-23 NOTE — Patient Instructions (Signed)

## 2019-07-23 NOTE — Progress Notes (Signed)
AST/ALT results reported to Dr. Irene Limbo. Received OK to treat. Per MD request, pt informed to avoid Tylenol and alcohol.

## 2019-07-23 NOTE — Telephone Encounter (Signed)
Scheduled appt per 7/27 los.  Will get a print out while in treatment.

## 2019-07-23 NOTE — Patient Instructions (Signed)
[facility] Discharge Instructions for Patients Receiving Chemotherapy  Today you received the following chemotherapy agents: Keytruda  To help prevent nausea and vomiting after your treatment, we encourage you to take your nausea medication as directed.    If you develop nausea and vomiting that is not controlled by your nausea medication, call the clinic.   BELOW ARE SYMPTOMS THAT SHOULD BE REPORTED IMMEDIATELY:  *FEVER GREATER THAN 100.5 F  *CHILLS WITH OR WITHOUT FEVER  NAUSEA AND VOMITING THAT IS NOT CONTROLLED WITH YOUR NAUSEA MEDICATION  *UNUSUAL SHORTNESS OF BREATH  *UNUSUAL BRUISING OR BLEEDING  TENDERNESS IN MOUTH AND THROAT WITH OR WITHOUT PRESENCE OF ULCERS  *URINARY PROBLEMS  *BOWEL PROBLEMS  UNUSUAL RASH Items with * indicate a potential emergency and should be followed up as soon as possible.  Feel free to call the clinic should you have any questions or concerns. The clinic phone number is [phone].  Please show the CHEMO ALERT CARD at check-in to the Emergency Department and triage nurse.  Coronavirus (COVID-19) Are you at risk?  Are you at risk for the Coronavirus (COVID-19)?  To be considered HIGH RISK for Coronavirus (COVID-19), you have to meet the following criteria:  . Traveled to [place], [place], [place], [place] or [place]; or in the [place] to [place], [place], [place], or [place]; and have fever, cough, and shortness of breath within the last 2 weeks of travel OR . Been in close contact with a person diagnosed with COVID-19 within the last 2 weeks and have fever, cough, and shortness of breath . IF YOU DO NOT MEET THESE CRITERIA, YOU ARE CONSIDERED LOW RISK FOR COVID-19.  What to do if you are HIGH RISK for COVID-19?  . If you are having a medical emergency, call 911. . Seek medical care right away. Before you go to a doctor's office, urgent care or emergency department, call ahead and tell them about your  recent travel, contact with someone diagnosed with COVID-19, and your symptoms. You should receive instructions from your physician's office regarding next steps of care.  . When you arrive at healthcare provider, tell the healthcare staff immediately you have returned from visiting [place], [place], [place], [place] or [place]; or traveled in the [place] to [place], [place], [place], or [place]; in the last two weeks or you have been in close contact with a person diagnosed with COVID-19 in the last 2 weeks.   . Tell the health care staff about your symptoms: fever, cough and shortness of breath. . After you have been seen by a medical provider, you will be either: o Tested for (COVID-19) and discharged home on quarantine except to seek medical care if symptoms worsen, and asked to  - Stay home and avoid contact with others until you get your results (4-5 days)  - Avoid travel on public transportation if possible (such as bus, train, or airplane) or o Sent to the Emergency Department by EMS for evaluation, COVID-19 testing, and possible admission depending on your condition and test results.  What to do if you are LOW RISK for COVID-19?  Reduce your risk of any infection by using the same precautions used for avoiding the common cold or flu:  . Wash your hands often with soap and warm water for at least 20 seconds.  If soap and water are not readily available, use an alcohol-based hand sanitizer with at least 60% alcohol.  . If coughing or   sneezing, cover your mouth and nose by coughing or sneezing into the elbow areas of your shirt or coat, into a tissue or into your sleeve (not your hands). . Avoid shaking hands with others and consider head nods or verbal greetings only. . Avoid touching your eyes, nose, or mouth with unwashed hands.  . Avoid close contact with people who are sick. . Avoid places or events with large numbers of people in one location, like concerts or sporting  events. . Carefully consider travel plans you have or are making. . If you are planning any travel outside or inside the [place], visit the CDC's Travelers' Health webpage for the latest health notices. . If you have some symptoms but not all symptoms, continue to monitor at home and seek medical attention if your symptoms worsen. . If you are having a medical emergency, call 911.   ADDITIONAL HEALTHCARE OPTIONS FOR PATIENTS  [facility] / e-Visit: [email or web address]         [facility]: [phone]  [facility]: [phone]                   [facility]: [phone]    

## 2019-07-30 ENCOUNTER — Other Ambulatory Visit: Payer: Medicaid Other

## 2019-07-30 ENCOUNTER — Ambulatory Visit: Payer: Medicaid Other | Admitting: Hematology

## 2019-07-30 ENCOUNTER — Ambulatory Visit: Payer: Medicaid Other

## 2019-08-09 ENCOUNTER — Other Ambulatory Visit: Payer: Medicaid Other

## 2019-08-09 ENCOUNTER — Ambulatory Visit: Payer: Medicaid Other | Admitting: Hematology

## 2019-08-09 ENCOUNTER — Ambulatory Visit: Payer: Medicaid Other

## 2019-08-12 NOTE — Progress Notes (Signed)
HEMATOLOGY ONCOLOGY PROGRESS NOTE  Date of service:   08/12/19     Patient Care Team: Clinic, General Medical as PCP - General (Family Medicine)  Chief complaint: Follow-up for Hodgkin's lymphoma  Diagnosis:   Refractory Mixed cellularity Hodgkin's lymphoma IVBE with extensive lymphadenopathy including right axillary, mediastinal and upper retroperitoneal and now biopsy proven pulmonary involvement. She was noted to have significant constitutional symptoms including significant weight loss, fevers chills and some night sweats.  Current Treatment: Third line therapy ICE for 2 planned cycles.  She was lost to f/u for >34yr  Previous treatment 5 cycles of AVD (without bleomycin due to lung involvement and DLCO of 37%, active smoker) Multiple avoidable treatment delays due to the patient's noncompliance with follow-up for avoidable reasons. She has been counseled repeatedly that this would increase the likelihood of unfavorable outcome.  2nd line therapy with Bendamustine + Brentuximab s/p 7 cycles.  3rd line therapy with ICE s/p 2 cycles   INTERVAL HISTORY:  [name] is here for follow-up for her Hodgkins lymphoma for C9D1 Pembrolizumab treatment. The patient's last visit with [place] was on 07/23/2019. The pt reports that she is doing well overall.  The pt reports no new concerns. Denies rashes, infection issues, new lumps or bumps, and diarrhea. She has not been using a lot of alcohol or Tylenol.  Lab results today (08/13/2019) of CBC w/diff and CMP is as follows: all values are WNL except for WBC at 3.5k, RBC at 3.62, HGB at 11.9, HCT at 34.3, AST at 70, ALT at 54 08/13/2019 sed rate is 6  On review of systems, pt reports no new concerns and denies new skin rashes, diarrhea, infection issues, new lumps or bumps, and any other symptoms.   REVIEW OF SYSTEMS:    A 10+ POINT REVIEW OF SYSTEMS WAS OBTAINED including neurology, dermatology, psychiatry, cardiac, respiratory, lymph,  extremities, GI, GU, Musculoskeletal, constitutional, breasts, reproductive, HEENT.  All pertinent positives are noted in the HPI.  All others are negative.  . Past Medical History:  Diagnosis Date  . ARDS (adult respiratory distress syndrome) ([place])   . Asthma   . Hodgkin lymphoma ([place])   . Hypertension     . Past Surgical History:  Procedure Laterality Date  . AXILLARY LYMPH NODE BIOPSY Right 03/19/2016   Procedure: AXILLARY LYMPH NODE BIOPSY;  Surgeon: Armandina Gemma, MD;  Location: [facility];  Service: General;  Laterality: Right;  . IR GENERIC HISTORICAL  12/22/2016   IR FLUORO GUIDE PORT INSERTION LEFT 12/22/2016 [facility]  . IR GENERIC HISTORICAL  12/22/2016   IR US GUIDE VASC ACCESS LEFT 12/22/2016 [facility]  . IR GENERIC HISTORICAL  12/22/2016   IR CV LINE INJECTION 12/22/2016 [facility]  . IR GENERIC HISTORICAL  12/22/2016   IR REMOVAL TUN ACCESS W/ PORT W/O FL MOD SED 12/22/2016 [facility]  . VIDEO BRONCHOSCOPY Bilateral 11/26/2016   Procedure: VIDEO BRONCHOSCOPY WITH FLUORO;  Surgeon: Rigoberto Noel, MD;  Location: [facility];  Service: Cardiopulmonary;  Laterality: Bilateral;    . Social History   Tobacco Use  . Smoking status: Former Smoker    Packs/day: 0.50    Years: 15.00    Pack years: 7.50    Types: Cigarettes    Quit date: 03/27/2016    Years since quitting: 3.3  . Smokeless tobacco: Never Used  Substance Use Topics  . Alcohol use: Yes    Comment: occasional now  . Drug use: Not on file  ALLERGIES:  has No Known Allergies.  MEDICATIONS:  Current Outpatient Medications  Medication Sig Dispense Refill  . acyclovir (ZOVIRAX) 400 MG tablet Take 1 tablet (400 mg total) by mouth 2 (two) times daily. Start after completion of Valtrex 60 tablet 6  . amitriptyline (ELAVIL) 10 MG tablet Take 1 tablet (10 mg total) by mouth at bedtime. (Patient not taking: Reported on 11/14/2018) 20 tablet 0  . gabapentin (NEURONTIN) 300 MG capsule Take 1 capsule  (300 mg total) by mouth 2 (two) times daily. 60 capsule 0  . oxyCODONE-acetaminophen (PERCOCET/ROXICET) 5-325 MG tablet Take 1-2 tablets by mouth every 6 (six) hours as needed for severe pain. 50 tablet 0   No current facility-administered medications for this visit.    Facility-Administered Medications Ordered in Other Visits  Medication Dose Route Frequency Provider Last Rate Last Dose  . sodium chloride flush (NS) 0.9 % injection 10 mL  10 mL Intracatheter PRN Brunetta Genera, MD        PHYSICAL EXAMINATION: ECOG PERFORMANCE STATUS: 1 - Symptomatic but completely ambulatory  There were no vitals filed for this visit.   GENERAL:alert, in no acute distress and comfortable SKIN: no acute rashes, no significant lesions EYES: conjunctiva are pink and non-injected, sclera anicteric OROPHARYNX: MMM, no exudates, no oropharyngeal erythema or ulceration NECK: supple, no JVD LYMPH:  no palpable lymphadenopathy in the cervical, axillary or inguinal regions LUNGS: clear to auscultation b/l with normal respiratory effort HEART: regular rate & rhythm ABDOMEN:  normoactive bowel sounds , non tender, not distended. No palpable hepatosplenomegaly.  Extremity: no pedal edema PSYCH: alert & oriented x 3 with fluent speech NEURO: no focal motor/sensory deficits   LABORATORY DATA:   I have reviewed the data as listed  . CBC Latest Ref Rng & Units 07/23/2019 06/28/2019 05/29/2019  WBC 4.0 - 10.5 K/uL 3.8(L) 4.6 3.9(L)  Hemoglobin 12.0 - 15.0 g/dL 11.7(L) 11.2(L) 11.7(L)  Hematocrit 36.0 - 46.0 % 35.0(L) 33.3(L) 35.7(L)  Platelets 150 - 400 K/uL 202 204 267   . CBC    Component Value Date/Time   WBC 3.8 (L) 07/23/2019 1049   RBC 3.58 (L) 07/23/2019 1049   HGB 11.7 (L) 07/23/2019 1049   HGB 10.6 (L) 11/07/2018 1047   HGB 11.7 07/20/2017 1315   HCT 35.0 (L) 07/23/2019 1049   HCT 35.2 07/20/2017 1315   PLT 202 07/23/2019 1049   PLT 473 (H) 11/07/2018 1047   PLT 266 07/20/2017 1315   MCV  97.8 07/23/2019 1049   MCV 93.2 07/20/2017 1315   MCH 32.7 07/23/2019 1049   MCHC 33.4 07/23/2019 1049   RDW 12.4 07/23/2019 1049   RDW 14.5 07/20/2017 1315   LYMPHSABS 0.8 07/23/2019 1049   LYMPHSABS 0.3 (L) 07/20/2017 1315   MONOABS 0.7 07/23/2019 1049   MONOABS 0.5 07/20/2017 1315   EOSABS 0.1 07/23/2019 1049   EOSABS 0.2 07/20/2017 1315   BASOSABS 0.0 07/23/2019 1049   BASOSABS 0.0 07/20/2017 1315   . CMP Latest Ref Rng & Units 07/23/2019 06/28/2019 05/29/2019  Glucose 70 - 99 mg/dL 67(L) 66(L) 75  BUN 6 - 20 mg/dL 10 12 8   Creatinine 0.44 - 1.00 mg/dL 0.88 1.01(H) 0.99  Sodium 135 - 145 mmol/L 137 140 139  Potassium 3.5 - 5.1 mmol/L 4.4 4.0 3.9  Chloride 98 - 111 mmol/L 102 103 102  CO2 22 - 32 mmol/L 26 23 26   Calcium 8.9 - 10.3 mg/dL 9.1 8.8(L) 9.0  Total Protein 6.5 - 8.1 g/dL  7.1 7.6 7.9  Total Bilirubin 0.3 - 1.2 mg/dL 0.6 0.3 0.4  Alkaline Phos 38 - 126 U/L 88 86 106  AST 15 - 41 U/L 128(H) 33 29  ALT 0 - 44 U/L 92(H) 25 16     RADIOGRAPHIC STUDIES: I have personally reviewed the radiological images as listed and agreed with the findings in the report. No results found.  ASSESSMENT & PLAN:   33 y.o. 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 She was noted to have significant constitutional symptoms including significant weight loss, fevers chills and some night sweats. HIV negative Hepatitis C and hepatitis B serologies negative. Echo with normal ejection fraction. Patient was treated with 5 cycles of AVD (Bleomycin held due to poor DLCO 37% and ongoing smoking). Multiple avoidable treatment delays due to the patient's noncompliance with follow-up for avoidable reasons. She has been counseled repeatedly that this would increase the likelihood of unfavorable outcome.   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 S/p 6 cycles of 2nd line treatment with Bendamustine/Brentuximab And 1 cycle of Bretuximab alone  -PET/CT scan results from 04/14/2017 were discussed in details. She appears to have some persistent disease in her right lung at Deauville 5. It is difficult to say if this is recurrent or persistent disease since the patient failed to follow-up on multiple scheduled PET/CT scans in the early part and prior to her second line treatment.  Patient was lost to followup for >1 yr  09/26/18 PET/CT revealed Imaging findings compatible with recurrence of disease. 2. Multiple new large areas of hypermetabolic nodularity and airspace consolidation within both lungs which is presumed to represent pulmonary involvement by lymphoma. Deauville criteria 5. 3. New hypermetabolic left supraclavicular, left retroperitoneal, and bilateral pelvic lymph nodes. Deauville criteria 5. 4. Multifocal hypermetabolic osseous lesions. Deauville criteria 5. 5. Small volume of ascites, new.   12/06/18 PET/CT revealed Generally improved appearance with previously mostly Deauville 5 activity now mostly Deauville 4 activity. 2. Layering of much of the airspace opacity in the lungs, although considerable right perihilar airspace opacity remains. The remaining pulmonary opacities are assess this Deauville 5 on the right and over L4 on the left, and were previously Deauville 5 bilaterally. 3. Mildly reduced size and moderately reduced activity in the abdominopelvic lymph nodes which are now dove L4. 4. The previously seen left supraclavicular lymph node seems to have completely resolved (Deauville 0). 5. The skeletal lesions remain at Deauville 4, although in absolute terms have decreased in SUV compared to previous. 6. No new regions of malignant involvement compared to prior exam. 7. Other imaging findings of potential clinical significance: Low-density blood pool  suggests anemia. Pectus excavatum. Volume loss in the right hemithorax.  06/13/19 PET/CT revealed "No abnormal hypermetabolism (Deauville 1). 2. Mixed lytic and sclerotic osseous lesions appear more prominent than on 12/06/2018 but do not have associated hypermetabolism, indicative of interval healing."  #2 s/p hypoxic respiratory failurewith dense right lung consolidation and left upper lobe consolidation with SVC syndrome. Patient has completed palliative radiation to the right lung mass causing SVC compression.  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  #4 Non compliancewith clinic and treatment followup. Missed 2nd dose of bendamustine with C2. Has missed  multiple appointment for her PET/CT and missed appointment at [facility] for consideration of Transplant.  Pt was lost to follow up after July 2018 and returned on 08/31/18 Discussed the patient's goals of care and the pt noted that she is ready to begin treatment again and maintain compliance and follow ups   #5 h/o Grade 1 neuropathyfrom Brentuximab-[place] - resolved  #6 Shingles outbreak - curentlyresolved but with significant post herpetic neuralgia  #7 Significant anemia and thrombocytopenia after C1 of ICE -- will need close monitoring.  #8 DVT Prophylaxis - lovenox  PLAN: -Discussed pt labwork today, 08/13/2019; HGB at 11.9 -Discussed again the two options for the pt at this time as either proceeding with Pembrolizumab until progression vs proceeding towards autologous bone marrow transplant. Discussed that I recommend considering transplant vs CAR T-cell therapy. Discussed that as the pt is now in metabolic remission, now would be an ideal time to consider more definitive treatment with curative intent with transplant vs CAR T-Cell therapy. Pt  voiced understanding. -Pt is not interested in a transplant or CAR T-Cell therapy at this time and would like to continue with maintenance Pembrolizumab.  -The pt has no prohibitive toxicities from continuing C9D1 Pembrolizumab at this time.  -Continue Acyclovirfor shingles prophylaxis -Will see the pt back in 3 weeks with labs   Please schedule next 2 cycles of Pembrolizumab q3weeks with labs and MD visit   The total time spent in the appt was 25 minutes and more than 50% was on counseling and direct patient cares.    Sullivan Lone MD [place] [facility]IV[facility] Hematology/Oncology Physician [facility]  (Office):       [phone] (Work cell):  [phone] (Fax):           [phone]  I, De Burrs, am acting as a scribe for Dr. Irene Limbo  .I have reviewed the above documentation for accuracy and completeness, and I agree with the above. Brunetta Genera MD

## 2019-08-13 ENCOUNTER — Other Ambulatory Visit: Payer: Self-pay

## 2019-08-13 ENCOUNTER — Inpatient Hospital Stay: Payer: Medicaid Other | Attending: Hematology

## 2019-08-13 ENCOUNTER — Inpatient Hospital Stay: Payer: Medicaid Other

## 2019-08-13 ENCOUNTER — Telehealth: Payer: Self-pay | Admitting: Hematology

## 2019-08-13 ENCOUNTER — Inpatient Hospital Stay (HOSPITAL_BASED_OUTPATIENT_CLINIC_OR_DEPARTMENT_OTHER): Payer: Medicaid Other | Admitting: Hematology

## 2019-08-13 VITALS — BP 126/86 | HR 81 | Temp 97.8°F | Resp 18 | Ht 67.0 in | Wt 144.0 lb

## 2019-08-13 DIAGNOSIS — R634 Abnormal weight loss: Secondary | ICD-10-CM | POA: Diagnosis not present

## 2019-08-13 DIAGNOSIS — C8198 Hodgkin lymphoma, unspecified, lymph nodes of multiple sites: Secondary | ICD-10-CM

## 2019-08-13 DIAGNOSIS — R6883 Chills (without fever): Secondary | ICD-10-CM | POA: Insufficient documentation

## 2019-08-13 DIAGNOSIS — Z7189 Other specified counseling: Secondary | ICD-10-CM

## 2019-08-13 DIAGNOSIS — R0902 Hypoxemia: Secondary | ICD-10-CM | POA: Diagnosis not present

## 2019-08-13 DIAGNOSIS — Z5112 Encounter for antineoplastic immunotherapy: Secondary | ICD-10-CM | POA: Insufficient documentation

## 2019-08-13 DIAGNOSIS — B0229 Other postherpetic nervous system involvement: Secondary | ICD-10-CM | POA: Diagnosis not present

## 2019-08-13 DIAGNOSIS — R61 Generalized hyperhidrosis: Secondary | ICD-10-CM | POA: Diagnosis not present

## 2019-08-13 DIAGNOSIS — R188 Other ascites: Secondary | ICD-10-CM | POA: Insufficient documentation

## 2019-08-13 DIAGNOSIS — R590 Localized enlarged lymph nodes: Secondary | ICD-10-CM | POA: Insufficient documentation

## 2019-08-13 DIAGNOSIS — Z9114 Patient's other noncompliance with medication regimen: Secondary | ICD-10-CM | POA: Insufficient documentation

## 2019-08-13 DIAGNOSIS — Z79899 Other long term (current) drug therapy: Secondary | ICD-10-CM | POA: Diagnosis not present

## 2019-08-13 DIAGNOSIS — C8128 Mixed cellularity classical Hodgkin lymphoma, lymph nodes of multiple sites: Secondary | ICD-10-CM | POA: Insufficient documentation

## 2019-08-13 DIAGNOSIS — Z87891 Personal history of nicotine dependence: Secondary | ICD-10-CM | POA: Diagnosis not present

## 2019-08-13 DIAGNOSIS — Q676 Pectus excavatum: Secondary | ICD-10-CM | POA: Diagnosis not present

## 2019-08-13 DIAGNOSIS — D649 Anemia, unspecified: Secondary | ICD-10-CM | POA: Diagnosis not present

## 2019-08-13 DIAGNOSIS — R918 Other nonspecific abnormal finding of lung field: Secondary | ICD-10-CM | POA: Insufficient documentation

## 2019-08-13 DIAGNOSIS — Z9119 Patient's noncompliance with other medical treatment and regimen: Secondary | ICD-10-CM | POA: Diagnosis not present

## 2019-08-13 DIAGNOSIS — Z95828 Presence of other vascular implants and grafts: Secondary | ICD-10-CM

## 2019-08-13 LAB — CBC WITH DIFFERENTIAL/PLATELET
Abs Immature Granulocytes: 0.01 10*3/uL (ref 0.00–0.07)
Basophils Absolute: 0 10*3/uL (ref 0.0–0.1)
Basophils Relative: 0 %
Eosinophils Absolute: 0.1 10*3/uL (ref 0.0–0.5)
Eosinophils Relative: 3 %
HCT: 35.3 % — ABNORMAL LOW (ref 36.0–46.0)
Hemoglobin: 11.9 g/dL — ABNORMAL LOW (ref 12.0–15.0)
Immature Granulocytes: 0 %
Lymphocytes Relative: 14 %
Lymphs Abs: 0.5 10*3/uL — ABNORMAL LOW (ref 0.7–4.0)
MCH: 32.9 pg (ref 26.0–34.0)
MCHC: 33.7 g/dL (ref 30.0–36.0)
MCV: 97.5 fL (ref 80.0–100.0)
Monocytes Absolute: 0.5 10*3/uL (ref 0.1–1.0)
Monocytes Relative: 13 %
Neutro Abs: 2.4 10*3/uL (ref 1.7–7.7)
Neutrophils Relative %: 70 %
Platelets: 191 10*3/uL (ref 150–400)
RBC: 3.62 MIL/uL — ABNORMAL LOW (ref 3.87–5.11)
RDW: 12 % (ref 11.5–15.5)
WBC: 3.5 10*3/uL — ABNORMAL LOW (ref 4.0–10.5)
nRBC: 0 % (ref 0.0–0.2)

## 2019-08-13 LAB — CMP (CANCER CENTER ONLY)
ALT: 54 U/L — ABNORMAL HIGH (ref 0–44)
AST: 70 U/L — ABNORMAL HIGH (ref 15–41)
Albumin: 4.3 g/dL (ref 3.5–5.0)
Alkaline Phosphatase: 88 U/L (ref 38–126)
Anion gap: 9 (ref 5–15)
BUN: 13 mg/dL (ref 6–20)
CO2: 26 mmol/L (ref 22–32)
Calcium: 9.2 mg/dL (ref 8.9–10.3)
Chloride: 102 mmol/L (ref 98–111)
Creatinine: 0.85 mg/dL (ref 0.44–1.00)
GFR, Est AFR Am: >60 mL/min (ref >60)
GFR, Estimated: >60 mL/min (ref >60)
Glucose, Bld: 73 mg/dL (ref 70–99)
Potassium: 4.2 mmol/L (ref 3.5–5.1)
Sodium: 137 mmol/L (ref 135–145)
Total Bilirubin: 0.6 mg/dL (ref 0.3–1.2)
Total Protein: 7.3 g/dL (ref 6.5–8.1)

## 2019-08-13 LAB — SEDIMENTATION RATE: Sed Rate: 6 mm/hr (ref 0–22)

## 2019-08-13 MED ORDER — FAMOTIDINE 20 MG PO TABS
ORAL_TABLET | ORAL | Status: AC
  Filled 2019-08-13: qty 2

## 2019-08-13 MED ORDER — FAMOTIDINE 20 MG PO TABS
40.0000 mg | ORAL_TABLET | Freq: Once | ORAL | Status: AC
  Administered 2019-08-13: 40 mg via ORAL

## 2019-08-13 MED ORDER — SODIUM CHLORIDE 0.9 % IV SOLN
200.0000 mg | Freq: Once | INTRAVENOUS | Status: AC
  Administered 2019-08-13: 200 mg via INTRAVENOUS
  Filled 2019-08-13: qty 8

## 2019-08-13 MED ORDER — DIPHENHYDRAMINE HCL 25 MG PO CAPS
ORAL_CAPSULE | ORAL | Status: AC
  Filled 2019-08-13: qty 1

## 2019-08-13 MED ORDER — ACETAMINOPHEN 325 MG PO TABS
650.0000 mg | ORAL_TABLET | Freq: Once | ORAL | Status: AC
  Administered 2019-08-13: 650 mg via ORAL

## 2019-08-13 MED ORDER — SODIUM CHLORIDE 0.9 % IV SOLN
Freq: Once | INTRAVENOUS | Status: AC
  Administered 2019-08-13: 10:00:00 via INTRAVENOUS
  Filled 2019-08-13: qty 250

## 2019-08-13 MED ORDER — DIPHENHYDRAMINE HCL 25 MG PO TABS
25.0000 mg | ORAL_TABLET | Freq: Once | ORAL | Status: AC
  Administered 2019-08-13: 25 mg via ORAL
  Filled 2019-08-13: qty 1

## 2019-08-13 MED ORDER — SODIUM CHLORIDE 0.9% FLUSH
10.0000 mL | INTRAVENOUS | Status: DC | PRN
  Administered 2019-08-13: 11:00:00 10 mL
  Filled 2019-08-13: qty 10

## 2019-08-13 MED ORDER — SODIUM CHLORIDE 0.9% FLUSH
10.0000 mL | INTRAVENOUS | Status: DC | PRN
  Administered 2019-08-13: 10 mL
  Filled 2019-08-13: qty 10

## 2019-08-13 MED ORDER — HEPARIN SOD (PORK) LOCK FLUSH 100 UNIT/ML IV SOLN
500.0000 [IU] | Freq: Once | INTRAVENOUS | Status: AC | PRN
  Administered 2019-08-13: 500 [IU]
  Filled 2019-08-13: qty 5

## 2019-08-13 MED ORDER — ACETAMINOPHEN 325 MG PO TABS
ORAL_TABLET | ORAL | Status: AC
  Filled 2019-08-13: qty 2

## 2019-08-13 NOTE — Telephone Encounter (Signed)
Scheduled appt per 8/17 los. °

## 2019-08-13 NOTE — Patient Instructions (Signed)
[facility] Discharge Instructions for Patients Receiving Chemotherapy  Today you received the following chemotherapy agents:  Keytruda.  To help prevent nausea and vomiting after your treatment, we encourage you to take your nausea medication as directed.   If you develop nausea and vomiting that is not controlled by your nausea medication, call the clinic.   BELOW ARE SYMPTOMS THAT SHOULD BE REPORTED IMMEDIATELY:  *FEVER GREATER THAN 100.5 F  *CHILLS WITH OR WITHOUT FEVER  NAUSEA AND VOMITING THAT IS NOT CONTROLLED WITH YOUR NAUSEA MEDICATION  *UNUSUAL SHORTNESS OF BREATH  *UNUSUAL BRUISING OR BLEEDING  TENDERNESS IN MOUTH AND THROAT WITH OR WITHOUT PRESENCE OF ULCERS  *URINARY PROBLEMS  *BOWEL PROBLEMS  UNUSUAL RASH Items with * indicate a potential emergency and should be followed up as soon as possible.  Feel free to call the clinic should you have any questions or concerns. The clinic phone number is [phone].  Please show the CHEMO ALERT CARD at check-in to the Emergency Department and triage nurse.    

## 2019-09-04 ENCOUNTER — Inpatient Hospital Stay: Payer: Medicaid Other

## 2019-09-04 ENCOUNTER — Inpatient Hospital Stay: Payer: Medicaid Other | Attending: Hematology

## 2019-09-04 ENCOUNTER — Inpatient Hospital Stay (HOSPITAL_BASED_OUTPATIENT_CLINIC_OR_DEPARTMENT_OTHER): Payer: Medicaid Other | Admitting: Hematology

## 2019-09-04 ENCOUNTER — Inpatient Hospital Stay: Payer: Medicaid Other | Admitting: Hematology

## 2019-09-04 ENCOUNTER — Telehealth: Payer: Self-pay | Admitting: Emergency Medicine

## 2019-09-04 ENCOUNTER — Other Ambulatory Visit: Payer: Self-pay

## 2019-09-04 VITALS — BP 132/94 | HR 85 | Temp 98.0°F | Resp 18 | Ht 67.0 in | Wt 146.9 lb

## 2019-09-04 DIAGNOSIS — Z79899 Other long term (current) drug therapy: Secondary | ICD-10-CM | POA: Diagnosis not present

## 2019-09-04 DIAGNOSIS — C8198 Hodgkin lymphoma, unspecified, lymph nodes of multiple sites: Secondary | ICD-10-CM

## 2019-09-04 DIAGNOSIS — R634 Abnormal weight loss: Secondary | ICD-10-CM | POA: Diagnosis not present

## 2019-09-04 DIAGNOSIS — J9691 Respiratory failure, unspecified with hypoxia: Secondary | ICD-10-CM | POA: Insufficient documentation

## 2019-09-04 DIAGNOSIS — R188 Other ascites: Secondary | ICD-10-CM | POA: Diagnosis not present

## 2019-09-04 DIAGNOSIS — Z5112 Encounter for antineoplastic immunotherapy: Secondary | ICD-10-CM

## 2019-09-04 DIAGNOSIS — Q676 Pectus excavatum: Secondary | ICD-10-CM | POA: Diagnosis not present

## 2019-09-04 DIAGNOSIS — Z9119 Patient's noncompliance with other medical treatment and regimen: Secondary | ICD-10-CM | POA: Diagnosis not present

## 2019-09-04 DIAGNOSIS — R945 Abnormal results of liver function studies: Secondary | ICD-10-CM | POA: Diagnosis not present

## 2019-09-04 DIAGNOSIS — Z87891 Personal history of nicotine dependence: Secondary | ICD-10-CM | POA: Insufficient documentation

## 2019-09-04 DIAGNOSIS — R590 Localized enlarged lymph nodes: Secondary | ICD-10-CM | POA: Insufficient documentation

## 2019-09-04 DIAGNOSIS — R61 Generalized hyperhidrosis: Secondary | ICD-10-CM | POA: Diagnosis not present

## 2019-09-04 DIAGNOSIS — R7989 Other specified abnormal findings of blood chemistry: Secondary | ICD-10-CM

## 2019-09-04 DIAGNOSIS — Z9114 Patient's other noncompliance with medication regimen: Secondary | ICD-10-CM | POA: Insufficient documentation

## 2019-09-04 DIAGNOSIS — C8128 Mixed cellularity classical Hodgkin lymphoma, lymph nodes of multiple sites: Secondary | ICD-10-CM | POA: Insufficient documentation

## 2019-09-04 DIAGNOSIS — Z7189 Other specified counseling: Secondary | ICD-10-CM

## 2019-09-04 DIAGNOSIS — R6883 Chills (without fever): Secondary | ICD-10-CM | POA: Diagnosis not present

## 2019-09-04 DIAGNOSIS — Z95828 Presence of other vascular implants and grafts: Secondary | ICD-10-CM

## 2019-09-04 LAB — CMP (CANCER CENTER ONLY)
ALT: 65 U/L — ABNORMAL HIGH (ref 0–44)
AST: 71 U/L — ABNORMAL HIGH (ref 15–41)
Albumin: 4.5 g/dL (ref 3.5–5.0)
Alkaline Phosphatase: 82 U/L (ref 38–126)
Anion gap: 11 (ref 5–15)
BUN: 11 mg/dL (ref 6–20)
CO2: 26 mmol/L (ref 22–32)
Calcium: 9.1 mg/dL (ref 8.9–10.3)
Chloride: 102 mmol/L (ref 98–111)
Creatinine: 0.8 mg/dL (ref 0.44–1.00)
GFR, Est AFR Am: >60 mL/min (ref >60)
GFR, Estimated: >60 mL/min (ref >60)
Glucose, Bld: 66 mg/dL — ABNORMAL LOW (ref 70–99)
Potassium: 4.4 mmol/L (ref 3.5–5.1)
Sodium: 139 mmol/L (ref 135–145)
Total Bilirubin: 0.6 mg/dL (ref 0.3–1.2)
Total Protein: 7.3 g/dL (ref 6.5–8.1)

## 2019-09-04 LAB — CBC WITH DIFFERENTIAL/PLATELET
Abs Immature Granulocytes: 0.01 10*3/uL (ref 0.00–0.07)
Basophils Absolute: 0 10*3/uL (ref 0.0–0.1)
Basophils Relative: 0 %
Eosinophils Absolute: 0.1 10*3/uL (ref 0.0–0.5)
Eosinophils Relative: 3 %
HCT: 34.4 % — ABNORMAL LOW (ref 36.0–46.0)
Hemoglobin: 11.3 g/dL — ABNORMAL LOW (ref 12.0–15.0)
Immature Granulocytes: 0 %
Lymphocytes Relative: 14 %
Lymphs Abs: 0.4 10*3/uL — ABNORMAL LOW (ref 0.7–4.0)
MCH: 32.9 pg (ref 26.0–34.0)
MCHC: 32.8 g/dL (ref 30.0–36.0)
MCV: 100.3 fL — ABNORMAL HIGH (ref 80.0–100.0)
Monocytes Absolute: 0.5 10*3/uL (ref 0.1–1.0)
Monocytes Relative: 15 %
Neutro Abs: 2.1 10*3/uL (ref 1.7–7.7)
Neutrophils Relative %: 68 %
Platelets: 177 10*3/uL (ref 150–400)
RBC: 3.43 MIL/uL — ABNORMAL LOW (ref 3.87–5.11)
RDW: 12.5 % (ref 11.5–15.5)
WBC: 3.2 10*3/uL — ABNORMAL LOW (ref 4.0–10.5)
nRBC: 0 % (ref 0.0–0.2)

## 2019-09-04 LAB — SEDIMENTATION RATE: Sed Rate: 5 mm/hr (ref 0–22)

## 2019-09-04 MED ORDER — FAMOTIDINE 20 MG PO TABS
ORAL_TABLET | ORAL | Status: AC
  Filled 2019-09-04: qty 2

## 2019-09-04 MED ORDER — SODIUM CHLORIDE 0.9 % IV SOLN
200.0000 mg | Freq: Once | INTRAVENOUS | Status: AC
  Administered 2019-09-04: 200 mg via INTRAVENOUS
  Filled 2019-09-04: qty 8

## 2019-09-04 MED ORDER — SODIUM CHLORIDE 0.9% FLUSH
10.0000 mL | INTRAVENOUS | Status: DC | PRN
  Administered 2019-09-04: 11:00:00 10 mL
  Filled 2019-09-04: qty 10

## 2019-09-04 MED ORDER — DIPHENHYDRAMINE HCL 25 MG PO CAPS
ORAL_CAPSULE | ORAL | Status: AC
  Filled 2019-09-04: qty 1

## 2019-09-04 MED ORDER — SODIUM CHLORIDE 0.9 % IV SOLN
Freq: Once | INTRAVENOUS | Status: AC
  Administered 2019-09-04: 13:00:00 via INTRAVENOUS
  Filled 2019-09-04: qty 250

## 2019-09-04 MED ORDER — FAMOTIDINE 20 MG PO TABS
40.0000 mg | ORAL_TABLET | Freq: Once | ORAL | Status: AC
  Administered 2019-09-04: 13:00:00 40 mg via ORAL

## 2019-09-04 MED ORDER — HEPARIN SOD (PORK) LOCK FLUSH 100 UNIT/ML IV SOLN
500.0000 [IU] | Freq: Once | INTRAVENOUS | Status: AC | PRN
  Administered 2019-09-04: 500 [IU]
  Filled 2019-09-04: qty 5

## 2019-09-04 MED ORDER — ACETAMINOPHEN 325 MG PO TABS
650.0000 mg | ORAL_TABLET | Freq: Once | ORAL | Status: AC
  Administered 2019-09-04: 650 mg via ORAL

## 2019-09-04 MED ORDER — DIPHENHYDRAMINE HCL 25 MG PO CAPS
25.0000 mg | ORAL_CAPSULE | Freq: Once | ORAL | Status: AC
  Administered 2019-09-04: 25 mg via ORAL

## 2019-09-04 MED ORDER — SODIUM CHLORIDE 0.9% FLUSH
10.0000 mL | INTRAVENOUS | Status: DC | PRN
  Administered 2019-09-04: 10 mL
  Filled 2019-09-04: qty 10

## 2019-09-04 MED ORDER — ACETAMINOPHEN 325 MG PO TABS
ORAL_TABLET | ORAL | Status: AC
  Filled 2019-09-04: qty 2

## 2019-09-04 NOTE — Patient Instructions (Signed)

## 2019-09-04 NOTE — Progress Notes (Signed)
Per Dr. Irene Limbo, it is ok to treat today with [facility] and it is ok for patient to receive Tylenol, Benadryl, and Pepcid as premeds.

## 2019-09-04 NOTE — Telephone Encounter (Signed)
Called pt about appts for today, no answer.  Unable to leave VM.

## 2019-09-04 NOTE — Patient Instructions (Signed)
[facility] Discharge Instructions for Patients Receiving Chemotherapy  Today you received the following chemotherapy agents:  Keytruda.  To help prevent nausea and vomiting after your treatment, we encourage you to take your nausea medication as directed.   If you develop nausea and vomiting that is not controlled by your nausea medication, call the clinic.   BELOW ARE SYMPTOMS THAT SHOULD BE REPORTED IMMEDIATELY:  *FEVER GREATER THAN 100.5 F  *CHILLS WITH OR WITHOUT FEVER  NAUSEA AND VOMITING THAT IS NOT CONTROLLED WITH YOUR NAUSEA MEDICATION  *UNUSUAL SHORTNESS OF BREATH  *UNUSUAL BRUISING OR BLEEDING  TENDERNESS IN MOUTH AND THROAT WITH OR WITHOUT PRESENCE OF ULCERS  *URINARY PROBLEMS  *BOWEL PROBLEMS  UNUSUAL RASH Items with * indicate a potential emergency and should be followed up as soon as possible.  Feel free to call the clinic should you have any questions or concerns. The clinic phone number is [phone].  Please show the CHEMO ALERT CARD at check-in to the Emergency Department and triage nurse.    

## 2019-09-04 NOTE — Progress Notes (Signed)
HEMATOLOGY ONCOLOGY PROGRESS NOTE  Date of service:   09/04/19     Patient Care Team: Clinic, General Medical as PCP - General (Family Medicine)  Chief complaint: Follow-up for Hodgkin's lymphoma  Diagnosis:   Refractory Mixed cellularity Hodgkin's lymphoma IVBE with extensive lymphadenopathy including right axillary, mediastinal and upper retroperitoneal and now biopsy proven pulmonary involvement. She was noted to have significant constitutional symptoms including significant weight loss, fevers chills and some night sweats.  Current Treatment:  Pembrolizumab  Previous treatment 5 cycles of AVD (without bleomycin due to lung involvement and DLCO of 37%, active smoker) Multiple avoidable treatment delays due to the patient's noncompliance with follow-up for avoidable reasons. She has been counseled repeatedly that this would increase the likelihood of unfavorable outcome.  2nd line therapy with Bendamustine + Brentuximab s/p 7 cycles.  3rd line therapy with ICE s/p 2 cycles   INTERVAL HISTORY:  Ms Takeda is here for follow-up for her Hodgkins lymphoma for C10D1 Pembrolizumab treatment. The patient's last visit with [place] was on 08/13/2019. The pt reports that she is doing well overall.  The pt reports that she is doing well and  has not been drinking a lot of alcohol  Lab results today (09/04/19) of CBC w/diff and CMP is as follows: all values are WNL except for WBC at 3.2K, RBC at 3.43, Hgb at 11.3, HCT at 34.4, MCV at 100.3, Lymphs Abs at 0.4K, Glucose at 66, AST at 71, ALT at 65 09/04/2019 Sed Rate at 5  On review of systems, pt denies abdominal pain, diarrhea, skin rashes, SOB and any other symptoms.   REVIEW OF SYSTEMS:    A 10+ POINT REVIEW OF SYSTEMS WAS OBTAINED including neurology, dermatology, psychiatry, cardiac, respiratory, lymph, extremities, GI, GU, Musculoskeletal, constitutional, breasts, reproductive, HEENT.  All pertinent positives are noted in the HPI.   All others are negative.  . Past Medical History:  Diagnosis Date  . ARDS (adult respiratory distress syndrome) ([place])   . Asthma   . Hodgkin lymphoma ([place])   . Hypertension     . Past Surgical History:  Procedure Laterality Date  . AXILLARY LYMPH NODE BIOPSY Right 03/19/2016   Procedure: AXILLARY LYMPH NODE BIOPSY;  Surgeon: Armandina Gemma, MD;  Location: [facility];  Service: General;  Laterality: Right;  . IR GENERIC HISTORICAL  12/22/2016   IR FLUORO GUIDE PORT INSERTION LEFT 12/22/2016 [facility]  . IR GENERIC HISTORICAL  12/22/2016   IR US GUIDE VASC ACCESS LEFT 12/22/2016 [facility]  . IR GENERIC HISTORICAL  12/22/2016   IR CV LINE INJECTION 12/22/2016 [facility]  . IR GENERIC HISTORICAL  12/22/2016   IR REMOVAL TUN ACCESS W/ PORT W/O FL MOD SED 12/22/2016 [facility]  . VIDEO BRONCHOSCOPY Bilateral 11/26/2016   Procedure: VIDEO BRONCHOSCOPY WITH FLUORO;  Surgeon: Rigoberto Noel, MD;  Location: [facility];  Service: Cardiopulmonary;  Laterality: Bilateral;    . Social History   Tobacco Use  . Smoking status: Former Smoker    Packs/day: 0.50    Years: 15.00    Pack years: 7.50    Types: Cigarettes    Quit date: 03/27/2016    Years since quitting: 3.4  . Smokeless tobacco: Never Used  Substance Use Topics  . Alcohol use: Yes    Comment: occasional now  . Drug use: Not on file    ALLERGIES:  has No Known Allergies.  MEDICATIONS:  Current Outpatient Medications  Medication Sig Dispense Refill  . acyclovir (ZOVIRAX) 400  MG tablet Take 1 tablet (400 mg total) by mouth 2 (two) times daily. Start after completion of Valtrex 60 tablet 6  . amitriptyline (ELAVIL) 10 MG tablet Take 1 tablet (10 mg total) by mouth at bedtime. (Patient not taking: Reported on 11/14/2018) 20 tablet 0  . gabapentin (NEURONTIN) 300 MG capsule Take 1 capsule (300 mg total) by mouth 2 (two) times daily. 60 capsule 0  . oxyCODONE-acetaminophen (PERCOCET/ROXICET) 5-325 MG tablet Take 1-2  tablets by mouth every 6 (six) hours as needed for severe pain. 50 tablet 0   No current facility-administered medications for this visit.    Facility-Administered Medications Ordered in Other Visits  Medication Dose Route Frequency Provider Last Rate Last Dose  . sodium chloride flush (NS) 0.9 % injection 10 mL  10 mL Intracatheter PRN Brunetta Genera, MD        PHYSICAL EXAMINATION: ECOG PERFORMANCE STATUS: 1 - Symptomatic but completely ambulatory  Vitals:   09/04/19 1132  BP: (!) 132/94  Pulse: 85  Resp: 18  Temp: 98 F (36.7 C)  SpO2: 97%     GENERAL:alert, in no acute distress and comfortable SKIN: no acute rashes, no significant lesions EYES: conjunctiva are pink and non-injected, sclera anicteric OROPHARYNX: MMM, no exudates, no oropharyngeal erythema or ulceration NECK: supple, no JVD LYMPH:  no palpable lymphadenopathy in the cervical, axillary or inguinal regions LUNGS: clear to auscultation b/l with normal respiratory effort HEART: regular rate & rhythm ABDOMEN:  normoactive bowel sounds , non tender, not distended. No palpable hepatosplenomegaly.  Extremity: no pedal edema PSYCH: alert & oriented x 3 with fluent speech NEURO: no focal motor/sensory deficits   LABORATORY DATA:   I have reviewed the data as listed  . CBC Latest Ref Rng & Units 09/04/2019 08/13/2019 07/23/2019  WBC 4.0 - 10.5 K/uL 3.2(L) 3.5(L) 3.8(L)  Hemoglobin 12.0 - 15.0 g/dL 11.3(L) 11.9(L) 11.7(L)  Hematocrit 36.0 - 46.0 % 34.4(L) 35.3(L) 35.0(L)  Platelets 150 - 400 K/uL 177 191 202   . CBC    Component Value Date/Time   WBC 3.2 (L) 09/04/2019 1129   RBC 3.43 (L) 09/04/2019 1129   HGB 11.3 (L) 09/04/2019 1129   HGB 10.6 (L) 11/07/2018 1047   HGB 11.7 07/20/2017 1315   HCT 34.4 (L) 09/04/2019 1129   HCT 35.2 07/20/2017 1315   PLT 177 09/04/2019 1129   PLT 473 (H) 11/07/2018 1047   PLT 266 07/20/2017 1315   MCV 100.3 (H) 09/04/2019 1129   MCV 93.2 07/20/2017 1315   MCH  32.9 09/04/2019 1129   MCHC 32.8 09/04/2019 1129   RDW 12.5 09/04/2019 1129   RDW 14.5 07/20/2017 1315   LYMPHSABS 0.4 (L) 09/04/2019 1129   LYMPHSABS 0.3 (L) 07/20/2017 1315   MONOABS 0.5 09/04/2019 1129   MONOABS 0.5 07/20/2017 1315   EOSABS 0.1 09/04/2019 1129   EOSABS 0.2 07/20/2017 1315   BASOSABS 0.0 09/04/2019 1129   BASOSABS 0.0 07/20/2017 1315   . CMP Latest Ref Rng & Units 09/04/2019 08/13/2019 07/23/2019  Glucose 70 - 99 mg/dL 66(L) 73 67(L)  BUN 6 - 20 mg/dL 11 13 10   Creatinine 0.44 - 1.00 mg/dL 0.80 0.85 0.88  Sodium 135 - 145 mmol/L 139 137 137  Potassium 3.5 - 5.1 mmol/L 4.4 4.2 4.4  Chloride 98 - 111 mmol/L 102 102 102  CO2 22 - 32 mmol/L 26 26 26   Calcium 8.9 - 10.3 mg/dL 9.1 9.2 9.1  Total Protein 6.5 - 8.1 g/dL 7.3 7.3  7.1  Total Bilirubin 0.3 - 1.2 mg/dL 0.6 0.6 0.6  Alkaline Phos 38 - 126 U/L 82 88 88  AST 15 - 41 U/L 71(H) 70(H) 128(H)  ALT 0 - 44 U/L 65(H) 54(H) 92(H)     RADIOGRAPHIC STUDIES: I have personally reviewed the radiological images as listed and agreed with the findings in the report. No results found.  ASSESSMENT & PLAN:   33 y.o. 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 She was noted to have significant constitutional symptoms including significant weight loss, fevers chills and some night sweats. HIV negative Hepatitis C and hepatitis B serologies negative. Echo with normal ejection fraction. Patient was treated with 5 cycles of AVD (Bleomycin held due to poor DLCO 37% and ongoing smoking). Multiple avoidable treatment delays due to the patient's noncompliance with follow-up for avoidable reasons. She has been counseled repeatedly that this would increase the likelihood of unfavorable outcome.  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 S/p 6 cycles of 2nd line treatment with Bendamustine/Brentuximab And 1 cycle of Bretuximab alone  -PET/CT scan results from 04/14/2017 were discussed in details. She appears to have some persistent disease in her right lung at Deauville 5. It is difficult to say if this is recurrent or persistent disease since the patient failed to follow-up on multiple scheduled PET/CT scans in the early part and prior to her second line treatment.  Patient was lost to followup for >1 yr  09/26/18 PET/CT revealed Imaging findings compatible with recurrence of disease. 2. Multiple new large areas of hypermetabolic nodularity and airspace consolidation within both lungs which is presumed to represent pulmonary involvement by lymphoma. Deauville criteria 5. 3. New hypermetabolic left supraclavicular, left retroperitoneal, and bilateral pelvic lymph nodes. Deauville criteria 5. 4. Multifocal hypermetabolic osseous lesions. Deauville criteria 5. 5. Small volume of ascites, new.   12/06/18 PET/CT revealed Generally improved appearance with previously mostly Deauville 5 activity now mostly Deauville 4 activity. 2. Layering of much of the airspace opacity in the lungs, although considerable right perihilar airspace opacity remains. The remaining pulmonary opacities are assess this Deauville 5 on the right and over L4 on the left, and were previously Deauville 5 bilaterally. 3. Mildly reduced size and moderately reduced activity in the abdominopelvic lymph nodes which are now dove L4. 4. The previously seen left supraclavicular lymph node seems to have completely resolved (Deauville 0). 5. The skeletal lesions remain at Deauville 4, although in absolute terms have decreased in SUV compared to previous. 6. No new regions of malignant involvement compared to prior exam. 7. Other imaging findings of potential clinical significance: Low-density blood pool suggests anemia. Pectus excavatum. Volume loss in  the right hemithorax.  06/13/19 PET/CT revealed "No abnormal hypermetabolism (Deauville 1). 2. Mixed lytic and sclerotic osseous lesions appear more prominent than on 12/06/2018 but do not have associated hypermetabolism, indicative of interval healing."  #2 s/p hypoxic respiratory failurewith dense right lung consolidation and left upper lobe consolidation with SVC syndrome. Patient has completed palliative radiation to the right lung mass causing SVC compression.  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  #4 Non compliancewith clinic and treatment followup. Missed 2nd dose of bendamustine with C2. Has missed multiple appointment  for her PET/CT and missed appointment at [facility] for consideration of Transplant.  Pt was lost to follow up after July 2018 and returned on 08/31/18 Discussed the patient's goals of care and the pt noted that she is ready to begin treatment again and maintain compliance and follow ups   #5 h/o Grade 1 neuropathyfrom Brentuximab-Vedotin - resolved  #6 Shingles outbreak - curentlyresolved but with significant post herpetic neuralgia  #7 Significant anemia and thrombocytopenia after C1 of ICE -- will need close monitoring.  #8 DVT Prophylaxis - lovenox  #9 Abnormal LFTs ? Related to Pembrolizumab - stable today PLAN:  -Discussed pt labwork today, 09/04/19; all values are WNL except for WBC at 3.2K, RBC at 3.43, Hgb at 11.3, HCT at 34.4, MCV at 100.3, Lymphs Abs at 0.4K, Glucose at 66, AST at 71, ALT at 65 -Discussed 09/04/2019 Sed Rate at 5 -Advised to avoid alcohol use and Tylenol  -Will monitor blood chemistries/LFTs -Pt is not interested in a transplant or CAR T-Cell therapy at this time and would like to continue with maintenance Pembrolizumab.  -The pt has no prohibitive  toxicities from continuing C10D1 Pembrolizumab at this time.  -Continue Acyclovirfor shingles prophylaxis   FOLLOW UP: F/u as per next 2 scheduled appointments for pembrolizumab treatment with labs and MD visi   The total time spent in the appt was 20 minutes and more than 50% was on counseling and direct patient cares.  All of the patient's questions were answered with apparent satisfaction. The patient knows to call the clinic with any problems, questions or concerns.    Sullivan Lone MD [place] AAHIVMS [facility],The Hematology/Oncology Physician [facility]  (Office):       [phone] (Work cell):  [phone] (Fax):           [phone]  I, Yevette Edwards, am acting as a scribe for Dr. Sullivan Lone.   .I have reviewed the above documentation for accuracy and completeness, and I agree with the above. Brunetta Genera MD

## 2019-09-05 ENCOUNTER — Telehealth: Payer: Self-pay | Admitting: Hematology

## 2019-09-05 NOTE — Telephone Encounter (Signed)
Per 9/8 los F/u as per next 2 scheduled appointments for pembrolizumab treatment with labs and MD visit

## 2019-09-21 NOTE — Progress Notes (Signed)
HEMATOLOGY ONCOLOGY PROGRESS NOTE  Date of service:   09/24/19     Patient Care Team: Clinic, General Medical as PCP - General (Family Medicine)  Chief complaint: Follow-up for Hodgkin's lymphoma  Diagnosis:   Refractory Mixed cellularity Hodgkin's lymphoma IVBE with extensive lymphadenopathy including right axillary, mediastinal and upper retroperitoneal and now biopsy proven pulmonary involvement. She was noted to have significant constitutional symptoms including significant weight loss, fevers chills and some night sweats.  Current Treatment:  Pembrolizumab  Previous treatment 5 cycles of AVD (without bleomycin due to lung involvement and DLCO of 37%, active smoker) Multiple avoidable treatment delays due to the patient's noncompliance with follow-up for avoidable reasons. She has been counseled repeatedly that this would increase the likelihood of unfavorable outcome.  2nd line therapy with Bendamustine + Brentuximab s/p 7 cycles.  3rd line therapy with ICE s/p 2 cycles   INTERVAL HISTORY:  Ms Loach is here for follow-up for her Hodgkins lymphoma for C11D1 Pembrolizumab treatment. The patient's last visit with [place] was on 09/04/2019. The pt reports that she is doing well overall.  The pt reports no new concerns in the interim. She has had no issues with her treatments and will let us know when she is ready to be referred back to the transplant team. Pt has been helping her children learn from home since the start of the school year.   Lab results today (09/24/19) of CBC w/diff and CMP is as follows: all values are WNL except for WBC at 3.4K, RBC at 3.40, Hgb at 11.2, HCT at 33.6, Lymphs Abs at 0.4K.  09/26/2019 Sed rate is in progress  On review of systems, pt denies diarrhea, skin rashes, changes in breating, new lumps/bumps, mouth sores, port issues and any other symptoms.   REVIEW OF SYSTEMS:    A 10+ POINT REVIEW OF SYSTEMS WAS OBTAINED including neurology,  dermatology, psychiatry, cardiac, respiratory, lymph, extremities, GI, GU, Musculoskeletal, constitutional, breasts, reproductive, HEENT.  All pertinent positives are noted in the HPI.  All others are negative.  . Past Medical History:  Diagnosis Date  . ARDS (adult respiratory distress syndrome) ([place])   . Asthma   . Hodgkin lymphoma ([place])   . Hypertension     . Past Surgical History:  Procedure Laterality Date  . AXILLARY LYMPH NODE BIOPSY Right 03/19/2016   Procedure: AXILLARY LYMPH NODE BIOPSY;  Surgeon: Armandina Gemma, MD;  Location: [facility];  Service: General;  Laterality: Right;  . IR GENERIC HISTORICAL  12/22/2016   IR FLUORO GUIDE PORT INSERTION LEFT 12/22/2016 [facility]  . IR GENERIC HISTORICAL  12/22/2016   IR US GUIDE VASC ACCESS LEFT 12/22/2016 [facility]  . IR GENERIC HISTORICAL  12/22/2016   IR CV LINE INJECTION 12/22/2016 [facility]  . IR GENERIC HISTORICAL  12/22/2016   IR REMOVAL TUN ACCESS W/ PORT W/O FL MOD SED 12/22/2016 [facility]  . VIDEO BRONCHOSCOPY Bilateral 11/26/2016   Procedure: VIDEO BRONCHOSCOPY WITH FLUORO;  Surgeon: Rigoberto Noel, MD;  Location: [facility];  Service: Cardiopulmonary;  Laterality: Bilateral;    . Social History   Tobacco Use  . Smoking status: Former Smoker    Packs/day: 0.50    Years: 15.00    Pack years: 7.50    Types: Cigarettes    Quit date: 03/27/2016    Years since quitting: 3.4  . Smokeless tobacco: Never Used  Substance Use Topics  . Alcohol use: Yes    Comment: occasional now  . Drug  use: Not on file    ALLERGIES:  has No Known Allergies.  MEDICATIONS:  Current Outpatient Medications  Medication Sig Dispense Refill  . acyclovir (ZOVIRAX) 400 MG tablet Take 1 tablet (400 mg total) by mouth 2 (two) times daily. Start after completion of Valtrex 60 tablet 6  . amitriptyline (ELAVIL) 10 MG tablet Take 1 tablet (10 mg total) by mouth at bedtime. (Patient not taking: Reported on 11/14/2018) 20 tablet 0  .  gabapentin (NEURONTIN) 300 MG capsule Take 1 capsule (300 mg total) by mouth 2 (two) times daily. 60 capsule 0  . oxyCODONE-acetaminophen (PERCOCET/ROXICET) 5-325 MG tablet Take 1-2 tablets by mouth every 6 (six) hours as needed for severe pain. 50 tablet 0   No current facility-administered medications for this visit.    Facility-Administered Medications Ordered in Other Visits  Medication Dose Route Frequency Provider Last Rate Last Dose  . pembrolizumab (KEYTRUDA) 200 mg in sodium chloride 0.9 % 50 mL chemo infusion  200 mg Intravenous Once Brunetta Genera, MD 116 mL/hr at 09/24/19 1049 200 mg at 09/24/19 1049  . sodium chloride flush (NS) 0.9 % injection 10 mL  10 mL Intracatheter PRN Brunetta Genera, MD        PHYSICAL EXAMINATION: ECOG PERFORMANCE STATUS: 1 - Symptomatic but completely ambulatory  Vitals:   09/24/19 0901  BP: 133/89  Pulse: 79  Resp: 18  Temp: 98.3 F (36.8 C)  SpO2: 96%     GENERAL:alert, in no acute distress and comfortable SKIN: no acute rashes, no significant lesions EYES: conjunctiva are pink and non-injected, sclera anicteric OROPHARYNX: MMM, no exudates, no oropharyngeal erythema or ulceration NECK: supple, no JVD LYMPH:  no palpable lymphadenopathy in the cervical, axillary or inguinal regions LUNGS: clear to auscultation b/l with normal respiratory effort HEART: regular rate & rhythm ABDOMEN:  normoactive bowel sounds , non tender, not distended. No palpable hepatosplenomegaly.  Extremity: no pedal edema PSYCH: alert & oriented x 3 with fluent speech NEURO: no focal motor/sensory deficits   LABORATORY DATA:   I have reviewed the data as listed  . CBC Latest Ref Rng & Units 09/24/2019 09/04/2019 08/13/2019  WBC 4.0 - 10.5 K/uL 3.4(L) 3.2(L) 3.5(L)  Hemoglobin 12.0 - 15.0 g/dL 11.2(L) 11.3(L) 11.9(L)  Hematocrit 36.0 - 46.0 % 33.6(L) 34.4(L) 35.3(L)  Platelets 150 - 400 K/uL 200 177 191   . CBC    Component Value Date/Time   WBC  3.4 (L) 09/24/2019 0836   RBC 3.40 (L) 09/24/2019 0836   HGB 11.2 (L) 09/24/2019 0836   HGB 10.6 (L) 11/07/2018 1047   HGB 11.7 07/20/2017 1315   HCT 33.6 (L) 09/24/2019 0836   HCT 35.2 07/20/2017 1315   PLT 200 09/24/2019 0836   PLT 473 (H) 11/07/2018 1047   PLT 266 07/20/2017 1315   MCV 98.8 09/24/2019 0836   MCV 93.2 07/20/2017 1315   MCH 32.9 09/24/2019 0836   MCHC 33.3 09/24/2019 0836   RDW 12.0 09/24/2019 0836   RDW 14.5 07/20/2017 1315   LYMPHSABS 0.4 (L) 09/24/2019 0836   LYMPHSABS 0.3 (L) 07/20/2017 1315   MONOABS 0.5 09/24/2019 0836   MONOABS 0.5 07/20/2017 1315   EOSABS 0.2 09/24/2019 0836   EOSABS 0.2 07/20/2017 1315   BASOSABS 0.0 09/24/2019 0836   BASOSABS 0.0 07/20/2017 1315   . CMP Latest Ref Rng & Units 09/24/2019 09/04/2019 08/13/2019  Glucose 70 - 99 mg/dL 82 66(L) 73  BUN 6 - 20 mg/dL 14 11 13   Creatinine  0.44 - 1.00 mg/dL 0.84 0.80 0.85  Sodium 135 - 145 mmol/L 138 139 137  Potassium 3.5 - 5.1 mmol/L 4.3 4.4 4.2  Chloride 98 - 111 mmol/L 104 102 102  CO2 22 - 32 mmol/L 26 26 26   Calcium 8.9 - 10.3 mg/dL 8.9 9.1 9.2  Total Protein 6.5 - 8.1 g/dL 6.9 7.3 7.3  Total Bilirubin 0.3 - 1.2 mg/dL 0.4 0.6 0.6  Alkaline Phos 38 - 126 U/L 72 82 88  AST 15 - 41 U/L 41 71(H) 70(H)  ALT 0 - 44 U/L 30 65(H) 54(H)     RADIOGRAPHIC STUDIES: I have personally reviewed the radiological images as listed and agreed with the findings in the report. No results found.  ASSESSMENT & PLAN:   33 y.o. 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 She was noted to have significant constitutional symptoms including significant weight loss, fevers chills and some night sweats. HIV negative Hepatitis C and hepatitis B serologies negative. Echo with normal ejection fraction. Patient was  treated with 5 cycles of AVD (Bleomycin held due to poor DLCO 37% and ongoing smoking). Multiple avoidable treatment delays due to the patient's noncompliance with follow-up for avoidable reasons. She has been counseled repeatedly that this would increase the likelihood of unfavorable outcome.  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 S/p 6 cycles of 2nd line treatment with Bendamustine/Brentuximab And 1 cycle of Bretuximab alone  -PET/CT scan results from 04/14/2017 were discussed in details. She appears to have some persistent disease in her right lung at Deauville 5. It is difficult to say if this is recurrent or persistent disease since the patient failed to follow-up on multiple scheduled PET/CT scans in the early part and prior to her second line treatment.  Patient was lost to followup for >1 yr  09/26/18 PET/CT revealed Imaging findings compatible with recurrence of disease. 2. Multiple new large areas of hypermetabolic nodularity and airspace consolidation within both lungs which is presumed to represent pulmonary involvement by lymphoma. Deauville criteria 5. 3. New hypermetabolic left supraclavicular, left retroperitoneal, and bilateral pelvic lymph nodes. Deauville criteria 5. 4. Multifocal hypermetabolic osseous lesions. Deauville criteria 5. 5. Small volume of ascites, new.   12/06/18 PET/CT revealed Generally improved appearance with previously mostly Deauville 5 activity now mostly Deauville 4 activity. 2. Layering of much of the airspace opacity in the lungs, although considerable right perihilar airspace opacity remains. The remaining pulmonary opacities are assess this Deauville 5 on the right and over L4 on the left, and were previously Deauville 5 bilaterally. 3. Mildly reduced size and moderately reduced activity in the abdominopelvic lymph nodes which are now dove L4. 4. The previously seen left supraclavicular lymph node seems to have  completely resolved (Deauville 0). 5. The skeletal lesions remain at Deauville 4, although in absolute terms have decreased in SUV compared to previous. 6. No new regions of malignant involvement compared to prior exam. 7. Other imaging findings of potential clinical significance: Low-density blood pool suggests anemia. Pectus excavatum. Volume loss in the right hemithorax.  06/13/19 PET/CT revealed "No abnormal hypermetabolism (Deauville 1). 2. Mixed lytic and sclerotic osseous lesions appear more prominent than on 12/06/2018 but do not have associated hypermetabolism, indicative of interval healing."  #2 s/p hypoxic respiratory failurewith dense right lung consolidation and left upper lobe consolidation with SVC syndrome. Patient has completed palliative  radiation to the right lung mass causing SVC compression.  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  #4 Non compliancewith clinic and treatment followup. Missed 2nd dose of bendamustine with C2. Has missed multiple appointment for her PET/CT and missed appointment at [facility] for consideration of Transplant.  Pt was lost to follow up after July 2018 and returned on 08/31/18 Discussed the patient's goals of care and the pt noted that she is ready to begin treatment again and maintain compliance and follow ups   #5 h/o Grade 1 neuropathyfrom Brentuximab-Vedotin - resolved  #6 Shingles outbreak - curentlyresolved but with significant post herpetic neuralgia  #7 Significant anemia and thrombocytopenia after C1 of ICE -- will need close monitoring.  #8 DVT Prophylaxis - lovenox  #9 Abnormal LFTs ? Related to Pembrolizumab - stable today  PLAN: -Discussed pt labwork today, 09/24/19; all values are WNL except for WBC at 3.4K, RBC at 3.40, Hgb at 11.2, HCT at  33.6, Lymphs Abs at 0.4K.  -Discussed 09/26/2019 Sed rate is in progress -Advised to avoid alcohol use and Tylenol  -Will monitor blood chemistries/LFTs -Pt is not interested in a transplant or CAR T-Cell therapy at this time and would like to continue with maintenance Pembrolizumab.  -The pt has no prohibitive toxicities from continuing C11D1 Pembrolizumab at this time.  -Continue Acyclovirfor shingles prophylaxis -Will repeat CT every 6 months -Advised pt to contact with any concerns or new symptomology    FOLLOW UP: -F/u as per next 2 scheduled appointments for Pembrolizumab treatment with labs and MD visit.   The total time spent in the appt was 15 minutes and more than 50% was on counseling and direct patient cares.  All of the patient's questions were answered with apparent satisfaction. The patient knows to call the clinic with any problems, questions or concerns.    Sullivan Lone MD [place] AAHIVMS [facility] Hematology/Oncology [facility]  (Office):       [phone] (Work cell):  [phone] (Fax):           [phone]  I, Yevette Edwards, am acting as a scribe for Dr. Sullivan Lone.   .I have reviewed the above documentation for accuracy and completeness, and I agree with the above. Brunetta Genera MD

## 2019-09-24 ENCOUNTER — Inpatient Hospital Stay: Payer: Medicaid Other

## 2019-09-24 ENCOUNTER — Telehealth: Payer: Self-pay | Admitting: Hematology

## 2019-09-24 ENCOUNTER — Other Ambulatory Visit: Payer: Self-pay

## 2019-09-24 ENCOUNTER — Inpatient Hospital Stay (HOSPITAL_BASED_OUTPATIENT_CLINIC_OR_DEPARTMENT_OTHER): Payer: Medicaid Other | Admitting: Hematology

## 2019-09-24 VITALS — BP 133/89 | HR 79 | Temp 98.3°F | Resp 18 | Ht 67.0 in | Wt 151.4 lb

## 2019-09-24 DIAGNOSIS — Z95828 Presence of other vascular implants and grafts: Secondary | ICD-10-CM

## 2019-09-24 DIAGNOSIS — C8198 Hodgkin lymphoma, unspecified, lymph nodes of multiple sites: Secondary | ICD-10-CM

## 2019-09-24 DIAGNOSIS — Z7189 Other specified counseling: Secondary | ICD-10-CM

## 2019-09-24 DIAGNOSIS — Z5112 Encounter for antineoplastic immunotherapy: Secondary | ICD-10-CM

## 2019-09-24 LAB — CBC WITH DIFFERENTIAL/PLATELET
Abs Immature Granulocytes: 0 10*3/uL (ref 0.00–0.07)
Basophils Absolute: 0 10*3/uL (ref 0.0–0.1)
Basophils Relative: 0 %
Eosinophils Absolute: 0.2 10*3/uL (ref 0.0–0.5)
Eosinophils Relative: 4 %
HCT: 33.6 % — ABNORMAL LOW (ref 36.0–46.0)
Hemoglobin: 11.2 g/dL — ABNORMAL LOW (ref 12.0–15.0)
Immature Granulocytes: 0 %
Lymphocytes Relative: 12 %
Lymphs Abs: 0.4 10*3/uL — ABNORMAL LOW (ref 0.7–4.0)
MCH: 32.9 pg (ref 26.0–34.0)
MCHC: 33.3 g/dL (ref 30.0–36.0)
MCV: 98.8 fL (ref 80.0–100.0)
Monocytes Absolute: 0.5 10*3/uL (ref 0.1–1.0)
Monocytes Relative: 15 %
Neutro Abs: 2.3 10*3/uL (ref 1.7–7.7)
Neutrophils Relative %: 69 %
Platelets: 200 10*3/uL (ref 150–400)
RBC: 3.4 MIL/uL — ABNORMAL LOW (ref 3.87–5.11)
RDW: 12 % (ref 11.5–15.5)
WBC: 3.4 10*3/uL — ABNORMAL LOW (ref 4.0–10.5)
nRBC: 0 % (ref 0.0–0.2)

## 2019-09-24 LAB — CMP (CANCER CENTER ONLY)
ALT: 30 U/L (ref 0–44)
AST: 41 U/L (ref 15–41)
Albumin: 4.2 g/dL (ref 3.5–5.0)
Alkaline Phosphatase: 72 U/L (ref 38–126)
Anion gap: 8 (ref 5–15)
BUN: 14 mg/dL (ref 6–20)
CO2: 26 mmol/L (ref 22–32)
Calcium: 8.9 mg/dL (ref 8.9–10.3)
Chloride: 104 mmol/L (ref 98–111)
Creatinine: 0.84 mg/dL (ref 0.44–1.00)
GFR, Est AFR Am: >60 mL/min (ref >60)
GFR, Estimated: >60 mL/min (ref >60)
Glucose, Bld: 82 mg/dL (ref 70–99)
Potassium: 4.3 mmol/L (ref 3.5–5.1)
Sodium: 138 mmol/L (ref 135–145)
Total Bilirubin: 0.4 mg/dL (ref 0.3–1.2)
Total Protein: 6.9 g/dL (ref 6.5–8.1)

## 2019-09-24 LAB — SEDIMENTATION RATE: Sed Rate: 6 mm/hr (ref 0–22)

## 2019-09-24 MED ORDER — ACETAMINOPHEN 325 MG PO TABS
ORAL_TABLET | ORAL | Status: AC
  Filled 2019-09-24: qty 2

## 2019-09-24 MED ORDER — SODIUM CHLORIDE 0.9% FLUSH
10.0000 mL | INTRAVENOUS | Status: DC | PRN
  Administered 2019-09-24: 08:00:00 10 mL
  Filled 2019-09-24: qty 10

## 2019-09-24 MED ORDER — DIPHENHYDRAMINE HCL 25 MG PO TABS
25.0000 mg | ORAL_TABLET | Freq: Once | ORAL | Status: AC
  Administered 2019-09-24: 10:00:00 25 mg via ORAL
  Filled 2019-09-24: qty 1

## 2019-09-24 MED ORDER — HEPARIN SOD (PORK) LOCK FLUSH 100 UNIT/ML IV SOLN
500.0000 [IU] | Freq: Once | INTRAVENOUS | Status: AC | PRN
  Administered 2019-09-24: 12:00:00 500 [IU]
  Filled 2019-09-24: qty 5

## 2019-09-24 MED ORDER — DIPHENHYDRAMINE HCL 25 MG PO CAPS
ORAL_CAPSULE | ORAL | Status: AC
  Filled 2019-09-24: qty 1

## 2019-09-24 MED ORDER — SODIUM CHLORIDE 0.9% FLUSH
10.0000 mL | INTRAVENOUS | Status: DC | PRN
  Administered 2019-09-24: 12:00:00 10 mL
  Filled 2019-09-24: qty 10

## 2019-09-24 MED ORDER — ACETAMINOPHEN 325 MG PO TABS
650.0000 mg | ORAL_TABLET | Freq: Once | ORAL | Status: AC
  Administered 2019-09-24: 10:00:00 650 mg via ORAL

## 2019-09-24 MED ORDER — FAMOTIDINE 20 MG PO TABS
40.0000 mg | ORAL_TABLET | Freq: Once | ORAL | Status: AC
  Administered 2019-09-24: 10:00:00 40 mg via ORAL

## 2019-09-24 MED ORDER — SODIUM CHLORIDE 0.9 % IV SOLN
200.0000 mg | Freq: Once | INTRAVENOUS | Status: AC
  Administered 2019-09-24: 200 mg via INTRAVENOUS
  Filled 2019-09-24: qty 8

## 2019-09-24 MED ORDER — SODIUM CHLORIDE 0.9 % IV SOLN
Freq: Once | INTRAVENOUS | Status: AC
  Administered 2019-09-24: 10:00:00 via INTRAVENOUS
  Filled 2019-09-24: qty 250

## 2019-09-24 MED ORDER — FAMOTIDINE 20 MG PO TABS
ORAL_TABLET | ORAL | Status: AC
  Filled 2019-09-24: qty 2

## 2019-09-24 NOTE — Telephone Encounter (Signed)
Per 9/28 los -F/u as per next 2 scheduled appointments for Pembrolizumab treatment with labs and MD visit.

## 2019-09-24 NOTE — Patient Instructions (Signed)
[facility] Discharge Instructions for Patients Receiving Chemotherapy  Today you received the following chemotherapy agents:  Keytruda.  To help prevent nausea and vomiting after your treatment, we encourage you to take your nausea medication as directed.   If you develop nausea and vomiting that is not controlled by your nausea medication, call the clinic.   BELOW ARE SYMPTOMS THAT SHOULD BE REPORTED IMMEDIATELY:  *FEVER GREATER THAN 100.5 F  *CHILLS WITH OR WITHOUT FEVER  NAUSEA AND VOMITING THAT IS NOT CONTROLLED WITH YOUR NAUSEA MEDICATION  *UNUSUAL SHORTNESS OF BREATH  *UNUSUAL BRUISING OR BLEEDING  TENDERNESS IN MOUTH AND THROAT WITH OR WITHOUT PRESENCE OF ULCERS  *URINARY PROBLEMS  *BOWEL PROBLEMS  UNUSUAL RASH Items with * indicate a potential emergency and should be followed up as soon as possible.  Feel free to call the clinic should you have any questions or concerns. The clinic phone number is [phone].  Please show the CHEMO ALERT CARD at check-in to the Emergency Department and triage nurse.    

## 2019-10-15 ENCOUNTER — Telehealth: Payer: Self-pay | Admitting: Hematology

## 2019-10-15 ENCOUNTER — Other Ambulatory Visit: Payer: Self-pay

## 2019-10-15 ENCOUNTER — Inpatient Hospital Stay: Payer: Medicaid Other

## 2019-10-15 ENCOUNTER — Inpatient Hospital Stay: Payer: Medicaid Other | Attending: Hematology

## 2019-10-15 ENCOUNTER — Inpatient Hospital Stay (HOSPITAL_BASED_OUTPATIENT_CLINIC_OR_DEPARTMENT_OTHER): Payer: Medicaid Other | Admitting: Hematology

## 2019-10-15 VITALS — BP 132/89 | HR 87 | Temp 97.8°F | Resp 18 | Ht 67.0 in | Wt 151.5 lb

## 2019-10-15 DIAGNOSIS — Z79899 Other long term (current) drug therapy: Secondary | ICD-10-CM | POA: Diagnosis not present

## 2019-10-15 DIAGNOSIS — R188 Other ascites: Secondary | ICD-10-CM | POA: Insufficient documentation

## 2019-10-15 DIAGNOSIS — Z5112 Encounter for antineoplastic immunotherapy: Secondary | ICD-10-CM | POA: Insufficient documentation

## 2019-10-15 DIAGNOSIS — R6883 Chills (without fever): Secondary | ICD-10-CM | POA: Insufficient documentation

## 2019-10-15 DIAGNOSIS — Z95828 Presence of other vascular implants and grafts: Secondary | ICD-10-CM

## 2019-10-15 DIAGNOSIS — Z7901 Long term (current) use of anticoagulants: Secondary | ICD-10-CM | POA: Insufficient documentation

## 2019-10-15 DIAGNOSIS — C8128 Mixed cellularity classical Hodgkin lymphoma, lymph nodes of multiple sites: Secondary | ICD-10-CM | POA: Diagnosis present

## 2019-10-15 DIAGNOSIS — R61 Generalized hyperhidrosis: Secondary | ICD-10-CM | POA: Insufficient documentation

## 2019-10-15 DIAGNOSIS — C8198 Hodgkin lymphoma, unspecified, lymph nodes of multiple sites: Secondary | ICD-10-CM

## 2019-10-15 DIAGNOSIS — Q676 Pectus excavatum: Secondary | ICD-10-CM | POA: Insufficient documentation

## 2019-10-15 DIAGNOSIS — R945 Abnormal results of liver function studies: Secondary | ICD-10-CM | POA: Insufficient documentation

## 2019-10-15 DIAGNOSIS — Z9114 Patient's other noncompliance with medication regimen: Secondary | ICD-10-CM | POA: Insufficient documentation

## 2019-10-15 DIAGNOSIS — R634 Abnormal weight loss: Secondary | ICD-10-CM | POA: Insufficient documentation

## 2019-10-15 DIAGNOSIS — Z9119 Patient's noncompliance with other medical treatment and regimen: Secondary | ICD-10-CM | POA: Diagnosis not present

## 2019-10-15 DIAGNOSIS — R590 Localized enlarged lymph nodes: Secondary | ICD-10-CM | POA: Insufficient documentation

## 2019-10-15 DIAGNOSIS — R918 Other nonspecific abnormal finding of lung field: Secondary | ICD-10-CM | POA: Diagnosis not present

## 2019-10-15 DIAGNOSIS — Z7189 Other specified counseling: Secondary | ICD-10-CM

## 2019-10-15 DIAGNOSIS — B0229 Other postherpetic nervous system involvement: Secondary | ICD-10-CM | POA: Insufficient documentation

## 2019-10-15 LAB — CMP (CANCER CENTER ONLY)
ALT: 23 U/L (ref 0–44)
AST: 43 U/L — ABNORMAL HIGH (ref 15–41)
Albumin: 4.1 g/dL (ref 3.5–5.0)
Alkaline Phosphatase: 62 U/L (ref 38–126)
Anion gap: 11 (ref 5–15)
BUN: 11 mg/dL (ref 6–20)
CO2: 25 mmol/L (ref 22–32)
Calcium: 9.2 mg/dL (ref 8.9–10.3)
Chloride: 101 mmol/L (ref 98–111)
Creatinine: 0.98 mg/dL (ref 0.44–1.00)
GFR, Est AFR Am: >60 mL/min (ref >60)
GFR, Estimated: >60 mL/min (ref >60)
Glucose, Bld: 75 mg/dL (ref 70–99)
Potassium: 4 mmol/L (ref 3.5–5.1)
Sodium: 137 mmol/L (ref 135–145)
Total Bilirubin: 0.9 mg/dL (ref 0.3–1.2)
Total Protein: 7.1 g/dL (ref 6.5–8.1)

## 2019-10-15 LAB — SEDIMENTATION RATE: Sed Rate: 15 mm/hr (ref 0–22)

## 2019-10-15 LAB — CBC WITH DIFFERENTIAL/PLATELET
Abs Immature Granulocytes: 0.01 10*3/uL (ref 0.00–0.07)
Basophils Absolute: 0 10*3/uL (ref 0.0–0.1)
Basophils Relative: 1 %
Eosinophils Absolute: 0.1 10*3/uL (ref 0.0–0.5)
Eosinophils Relative: 3 %
HCT: 35.3 % — ABNORMAL LOW (ref 36.0–46.0)
Hemoglobin: 12.1 g/dL (ref 12.0–15.0)
Immature Granulocytes: 0 %
Lymphocytes Relative: 15 %
Lymphs Abs: 0.6 10*3/uL — ABNORMAL LOW (ref 0.7–4.0)
MCH: 33.4 pg (ref 26.0–34.0)
MCHC: 34.3 g/dL (ref 30.0–36.0)
MCV: 97.5 fL (ref 80.0–100.0)
Monocytes Absolute: 0.4 10*3/uL (ref 0.1–1.0)
Monocytes Relative: 11 %
Neutro Abs: 2.7 10*3/uL (ref 1.7–7.7)
Neutrophils Relative %: 70 %
Platelets: 226 10*3/uL (ref 150–400)
RBC: 3.62 MIL/uL — ABNORMAL LOW (ref 3.87–5.11)
RDW: 11.9 % (ref 11.5–15.5)
WBC: 3.8 10*3/uL — ABNORMAL LOW (ref 4.0–10.5)
nRBC: 0 % (ref 0.0–0.2)

## 2019-10-15 MED ORDER — SODIUM CHLORIDE 0.9 % IV SOLN
Freq: Once | INTRAVENOUS | Status: AC
  Administered 2019-10-15: 10:00:00 via INTRAVENOUS
  Filled 2019-10-15: qty 250

## 2019-10-15 MED ORDER — SODIUM CHLORIDE 0.9 % IV SOLN
200.0000 mg | Freq: Once | INTRAVENOUS | Status: AC
  Administered 2019-10-15: 11:00:00 200 mg via INTRAVENOUS
  Filled 2019-10-15: qty 8

## 2019-10-15 MED ORDER — FAMOTIDINE 20 MG PO TABS
ORAL_TABLET | ORAL | Status: AC
  Filled 2019-10-15: qty 2

## 2019-10-15 MED ORDER — DIPHENHYDRAMINE HCL 25 MG PO TABS
25.0000 mg | ORAL_TABLET | Freq: Once | ORAL | Status: AC
  Administered 2019-10-15: 10:00:00 25 mg via ORAL
  Filled 2019-10-15: qty 1

## 2019-10-15 MED ORDER — HEPARIN SOD (PORK) LOCK FLUSH 100 UNIT/ML IV SOLN
500.0000 [IU] | Freq: Once | INTRAVENOUS | Status: AC | PRN
  Administered 2019-10-15: 11:00:00 500 [IU]
  Filled 2019-10-15: qty 5

## 2019-10-15 MED ORDER — ACETAMINOPHEN 325 MG PO TABS
ORAL_TABLET | ORAL | Status: AC
  Filled 2019-10-15: qty 2

## 2019-10-15 MED ORDER — SODIUM CHLORIDE 0.9% FLUSH
10.0000 mL | INTRAVENOUS | Status: DC | PRN
  Administered 2019-10-15: 10 mL
  Filled 2019-10-15: qty 10

## 2019-10-15 MED ORDER — ACETAMINOPHEN 325 MG PO TABS
650.0000 mg | ORAL_TABLET | Freq: Once | ORAL | Status: AC
  Administered 2019-10-15: 10:00:00 650 mg via ORAL

## 2019-10-15 MED ORDER — DIPHENHYDRAMINE HCL 25 MG PO CAPS
ORAL_CAPSULE | ORAL | Status: AC
  Filled 2019-10-15: qty 1

## 2019-10-15 MED ORDER — FAMOTIDINE 20 MG PO TABS
40.0000 mg | ORAL_TABLET | Freq: Once | ORAL | Status: AC
  Administered 2019-10-15: 10:00:00 40 mg via ORAL

## 2019-10-15 NOTE — Patient Instructions (Signed)

## 2019-10-15 NOTE — Patient Instructions (Signed)

## 2019-10-15 NOTE — Progress Notes (Signed)
HEMATOLOGY ONCOLOGY PROGRESS NOTE  Date of service:   10/15/19     Patient Care Team: Clinic, General Medical as PCP - General (Family Medicine)  Chief complaint: Follow-up for Hodgkin's lymphoma  Diagnosis:   Refractory Mixed cellularity Hodgkin's lymphoma IVBE with extensive lymphadenopathy including right axillary, mediastinal and upper retroperitoneal and now biopsy proven pulmonary involvement. She was noted to have significant constitutional symptoms including significant weight loss, fevers chills and some night sweats.  Current Treatment:  Pembrolizumab  Previous treatment 5 cycles of AVD (without bleomycin due to lung involvement and DLCO of 37%, active smoker) Multiple avoidable treatment delays due to the patient's noncompliance with follow-up for avoidable reasons. She has been counseled repeatedly that this would increase the likelihood of unfavorable outcome.  2nd line therapy with Bendamustine + Brentuximab s/p 7 cycles.  3rd line therapy with ICE s/p 2 cycles   INTERVAL HISTORY:  Heidi [name] is here for follow-up for her Hodgkins lymphoma for C12D1 Pembrolizumab treatment. The patient's last visit with [place] was on 09/24/2019. The pt reports that she is doing well overall.  The pt reports no new concerns and that she and her family have been doing well since our last visit. Pt notes that she has been eating well and is now back at her normal weight. Her child will continue to do virtual schooling as she doesn't want to take any unecessary risks. Pt states that she still has not made up her mind about going to [facility] for a transplant. She does not want anymore chemotherapy but she does understand that the transplant could be curative for her.   Lab results today (10/15/19) of CBC w/diff and CMP is as follows: all values are WNL except for WBC at 3.8K, RBC at 3.62, HCT at 35.3, Lymphs Abs at 0.6K, AST at 43. 10/15/2019 Sed rate at 15  On review of systems, pt  denies fevers, chills, new lumps/bumps, fatigue, skin rashes, SOB, unexpected weight loss, abdominal pain, abnormal bowel movements, mouth sores and any other symptoms.    REVIEW OF SYSTEMS:   A 10+ POINT REVIEW OF SYSTEMS WAS OBTAINED including neurology, dermatology, psychiatry, cardiac, respiratory, lymph, extremities, GI, GU, Musculoskeletal, constitutional, breasts, reproductive, HEENT.  All pertinent positives are noted in the HPI.  All others are negative.   . Past Medical History:  Diagnosis Date   ARDS (adult respiratory distress syndrome) ([place])    Asthma    Hodgkin lymphoma ([place])    Hypertension     . Past Surgical History:  Procedure Laterality Date   AXILLARY LYMPH NODE BIOPSY Right 03/19/2016   Procedure: AXILLARY LYMPH NODE BIOPSY;  Surgeon: Armandina Gemma, MD;  Location: [facility];  Service: General;  Laterality: Right;   IR GENERIC HISTORICAL  12/22/2016   IR FLUORO GUIDE PORT INSERTION LEFT 12/22/2016 [facility]   IR GENERIC HISTORICAL  12/22/2016   IR US GUIDE VASC ACCESS LEFT 12/22/2016 [facility]   IR GENERIC HISTORICAL  12/22/2016   IR CV LINE INJECTION 12/22/2016 [facility]   IR GENERIC HISTORICAL  12/22/2016   IR REMOVAL TUN ACCESS W/ PORT W/O FL MOD SED 12/22/2016 [facility]   VIDEO BRONCHOSCOPY Bilateral 11/26/2016   Procedure: VIDEO BRONCHOSCOPY WITH FLUORO;  Surgeon: Rigoberto Noel, MD;  Location: [facility];  Service: Cardiopulmonary;  Laterality: Bilateral;    . Social History   Tobacco Use   Smoking status: Former Smoker    Packs/day: 0.50    Years: 15.00  Pack years: 7.50    Types: Cigarettes    Quit date: 03/27/2016    Years since quitting: 3.5   Smokeless tobacco: Never Used  Substance Use Topics   Alcohol use: Yes    Comment: occasional now   Drug use: Not on file    ALLERGIES:  has No Known Allergies.  MEDICATIONS:  Current Outpatient Medications  Medication Sig Dispense Refill   acyclovir (ZOVIRAX) 400 MG  tablet Take 1 tablet (400 mg total) by mouth 2 (two) times daily. Start after completion of Valtrex 60 tablet 6   amitriptyline (ELAVIL) 10 MG tablet Take 1 tablet (10 mg total) by mouth at bedtime. (Patient not taking: Reported on 11/14/2018) 20 tablet 0   gabapentin (NEURONTIN) 300 MG capsule Take 1 capsule (300 mg total) by mouth 2 (two) times daily. 60 capsule 0   oxyCODONE-acetaminophen (PERCOCET/ROXICET) 5-325 MG tablet Take 1-2 tablets by mouth every 6 (six) hours as needed for severe pain. 50 tablet 0   No current facility-administered medications for this visit.    Facility-Administered Medications Ordered in Other Visits  Medication Dose Route Frequency Provider Last Rate Last Dose   sodium chloride flush (NS) 0.9 % injection 10 mL  10 mL Intracatheter PRN Brunetta Genera, MD        PHYSICAL EXAMINATION: ECOG PERFORMANCE STATUS: 1 - Symptomatic but completely ambulatory  Vitals:   10/15/19 0903  BP: 132/89  Pulse: 87  Resp: 18  Temp: 97.8 F (36.6 C)  SpO2: 96%     GENERAL:alert, in no acute distress and comfortable SKIN: no acute rashes, no significant lesions EYES: conjunctiva are pink and non-injected, sclera anicteric OROPHARYNX: MMM, no exudates, no oropharyngeal erythema or ulceration NECK: supple, no JVD LYMPH:  no palpable lymphadenopathy in the cervical, axillary or inguinal regions LUNGS: clear to auscultation b/l with normal respiratory effort HEART: regular rate & rhythm ABDOMEN:  normoactive bowel sounds , non tender, not distended. No palpable hepatosplenomegaly.  Extremity: no pedal edema PSYCH: alert & oriented x 3 with fluent speech NEURO: no focal motor/sensory deficits  LABORATORY DATA:   I have reviewed the data as listed  . CBC Latest Ref Rng & Units 10/15/2019 09/24/2019 09/04/2019  WBC 4.0 - 10.5 K/uL 3.8(L) 3.4(L) 3.2(L)  Hemoglobin 12.0 - 15.0 g/dL 12.1 11.2(L) 11.3(L)  Hematocrit 36.0 - 46.0 % 35.3(L) 33.6(L) 34.4(L)  Platelets  150 - 400 K/uL 226 200 177   . CBC    Component Value Date/Time   WBC 3.8 (L) 10/15/2019 0830   RBC 3.62 (L) 10/15/2019 0830   HGB 12.1 10/15/2019 0830   HGB 10.6 (L) 11/07/2018 1047   HGB 11.7 07/20/2017 1315   HCT 35.3 (L) 10/15/2019 0830   HCT 35.2 07/20/2017 1315   PLT 226 10/15/2019 0830   PLT 473 (H) 11/07/2018 1047   PLT 266 07/20/2017 1315   MCV 97.5 10/15/2019 0830   MCV 93.2 07/20/2017 1315   MCH 33.4 10/15/2019 0830   MCHC 34.3 10/15/2019 0830   RDW 11.9 10/15/2019 0830   RDW 14.5 07/20/2017 1315   LYMPHSABS 0.6 (L) 10/15/2019 0830   LYMPHSABS 0.3 (L) 07/20/2017 1315   MONOABS 0.4 10/15/2019 0830   MONOABS 0.5 07/20/2017 1315   EOSABS 0.1 10/15/2019 0830   EOSABS 0.2 07/20/2017 1315   BASOSABS 0.0 10/15/2019 0830   BASOSABS 0.0 07/20/2017 1315   . CMP Latest Ref Rng & Units 10/15/2019 09/24/2019 09/04/2019  Glucose 70 - 99 mg/dL 75 82 66(L)  BUN  6 - 20 mg/dL 11 14 11   Creatinine 0.44 - 1.00 mg/dL 0.98 0.84 0.80  Sodium 135 - 145 mmol/L 137 138 139  Potassium 3.5 - 5.1 mmol/L 4.0 4.3 4.4  Chloride 98 - 111 mmol/L 101 104 102  CO2 22 - 32 mmol/L 25 26 26   Calcium 8.9 - 10.3 mg/dL 9.2 8.9 9.1  Total Protein 6.5 - 8.1 g/dL 7.1 6.9 7.3  Total Bilirubin 0.3 - 1.2 mg/dL 0.9 0.4 0.6  Alkaline Phos 38 - 126 U/L 62 72 82  AST 15 - 41 U/L 43(H) 41 71(H)  ALT 0 - 44 U/L 23 30 65(H)     RADIOGRAPHIC STUDIES: I have personally reviewed the radiological images as listed and agreed with the findings in the report. No results found.  ASSESSMENT & PLAN:   33 y.o. 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 She was noted to have significant constitutional symptoms including significant weight loss, fevers chills and some night sweats. HIV negative Hepatitis C and hepatitis B  serologies negative. Echo with normal ejection fraction. Patient was treated with 5 cycles of AVD (Bleomycin held due to poor DLCO 37% and ongoing smoking). Multiple avoidable treatment delays due to the patient's noncompliance with follow-up for avoidable reasons. She has been counseled repeatedly that this would increase the likelihood of unfavorable outcome.  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 S/p 6 cycles of 2nd line treatment with Bendamustine/Brentuximab And 1 cycle of Bretuximab alone  -PET/CT scan results from 04/14/2017 were discussed in details. She appears to have some persistent disease in her right lung at Deauville 5. It is difficult to say if this is recurrent or persistent disease since the patient failed to follow-up on multiple scheduled PET/CT scans in the early part and prior to her second line treatment.  Patient was lost to followup for >1 yr  09/26/18 PET/CT revealed Imaging findings compatible with recurrence of disease. 2. Multiple new large areas of hypermetabolic nodularity and airspace consolidation within both lungs which is presumed to represent pulmonary involvement by lymphoma. Deauville criteria 5. 3. New hypermetabolic left supraclavicular, left retroperitoneal, and bilateral pelvic lymph nodes. Deauville criteria 5. 4. Multifocal hypermetabolic osseous lesions. Deauville criteria 5. 5. Small volume of ascites, new.   12/06/18 PET/CT revealed Generally improved appearance with previously mostly Deauville 5 activity now mostly Deauville 4 activity. 2. Layering of much of the airspace opacity in the lungs, although considerable right perihilar airspace opacity remains. The remaining pulmonary opacities are assess this Deauville 5 on the right and over L4 on the left, and were previously Deauville 5 bilaterally. 3. Mildly reduced size and moderately reduced activity in the abdominopelvic lymph nodes which are now dove L4. 4. The  previously seen left supraclavicular lymph node seems to have completely resolved (Deauville 0). 5. The skeletal lesions remain at Deauville 4, although in absolute terms have decreased in SUV compared to previous. 6. No new regions of malignant involvement compared to prior exam. 7. Other imaging findings of potential clinical significance: Low-density blood pool suggests anemia. Pectus excavatum. Volume loss in the right hemithorax.  06/13/19 PET/CT revealed "No abnormal hypermetabolism (Deauville 1). 2. Mixed lytic and sclerotic osseous lesions appear more prominent than on 12/06/2018 but do not have associated hypermetabolism, indicative of interval healing."  #2 s/p hypoxic respiratory failurewith dense right lung consolidation and left upper  lobe consolidation with SVC syndrome. Patient has completed palliative radiation to the right lung mass causing SVC compression.  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  #4 Non compliancewith clinic and treatment followup. Missed 2nd dose of bendamustine with C2. Has missed multiple appointment for her PET/CT and missed appointment at [facility] for consideration of Transplant.  Pt was lost to follow up after July 2018 and returned on 08/31/18 Discussed the patient's goals of care and the pt noted that she is ready to begin treatment again and maintain compliance and follow ups   #5 h/o Grade 1 neuropathyfrom Brentuximab-Vedotin - resolved  #6 Shingles outbreak - curentlyresolved but with significant post herpetic neuralgia  #7 Significant anemia and thrombocytopenia after C1 of ICE -- will need close monitoring.  #8 DVT Prophylaxis - lovenox  #9 Abnormal LFTs ? Related to Pembrolizumab - stable today  PLAN: -Discussed pt labwork today, 10/15/19; blood counts look  good, blood chemistries look normal -Discussed 10/15/2019 Sed rate at 15 -Discussed again referral to [facility] for autologous stem cell transplant  -Advised pt that the best time to do the transplant is now, when she is in full remission -Advised pt that if her Hodgkins Lymphoma returns there will be very limited treatment options -Pt is unsure about a transplant or CAR T-Cell therapy at this time and would like to continue with maintenance Pembrolizumab. -The pt has no prohibitive toxicities from continuing C12D1 Pembrolizumab at this time. -Continue Acyclovirfor shingles prophylaxis -Will repeat CT every 6 months unless new symptoms arise. -Will monitor blood chemistries/LFTs -Advised pt to contact with any concerns or new symptomology    FOLLOW UP: Please f/u with next 2 cycles of Pembrolizumab as scheduled with MD visit and labs  The total time spent in the appt was 20 minutes and more than 50% was on counseling and direct patient cares.  All of the patient's questions were answered with apparent satisfaction. The patient knows to call the clinic with any problems, questions or concerns.    Sullivan Lone MD [place] AAHIVMS [facility] Hematology/Oncology Physician [facility]  (Office):       [phone] (Work cell):  [phone] (Fax):           [phone]  I, Yevette Edwards, am acting as a scribe for Dr. Sullivan Lone.   .I have reviewed the above documentation for accuracy and completeness, and I agree with the above. Brunetta Genera MD

## 2019-10-15 NOTE — Telephone Encounter (Signed)
Per 10/19 los f/u with next 2 cycles of Pembrolizumab as scheduled with MD visit and labs

## 2019-11-05 ENCOUNTER — Telehealth: Payer: Self-pay

## 2019-11-05 ENCOUNTER — Inpatient Hospital Stay: Payer: Medicaid Other | Attending: Hematology

## 2019-11-05 ENCOUNTER — Telehealth: Payer: Self-pay | Admitting: Emergency Medicine

## 2019-11-05 ENCOUNTER — Inpatient Hospital Stay: Payer: Medicaid Other

## 2019-11-05 ENCOUNTER — Inpatient Hospital Stay: Payer: Medicaid Other | Admitting: Hematology

## 2019-11-05 DIAGNOSIS — Z7901 Long term (current) use of anticoagulants: Secondary | ICD-10-CM | POA: Insufficient documentation

## 2019-11-05 DIAGNOSIS — Z5112 Encounter for antineoplastic immunotherapy: Secondary | ICD-10-CM | POA: Insufficient documentation

## 2019-11-05 DIAGNOSIS — Z9119 Patient's noncompliance with other medical treatment and regimen: Secondary | ICD-10-CM | POA: Insufficient documentation

## 2019-11-05 DIAGNOSIS — Z9114 Patient's other noncompliance with medication regimen: Secondary | ICD-10-CM | POA: Insufficient documentation

## 2019-11-05 DIAGNOSIS — B0229 Other postherpetic nervous system involvement: Secondary | ICD-10-CM | POA: Insufficient documentation

## 2019-11-05 DIAGNOSIS — R590 Localized enlarged lymph nodes: Secondary | ICD-10-CM | POA: Insufficient documentation

## 2019-11-05 DIAGNOSIS — Z87891 Personal history of nicotine dependence: Secondary | ICD-10-CM | POA: Insufficient documentation

## 2019-11-05 DIAGNOSIS — D649 Anemia, unspecified: Secondary | ICD-10-CM | POA: Insufficient documentation

## 2019-11-05 DIAGNOSIS — R509 Fever, unspecified: Secondary | ICD-10-CM | POA: Insufficient documentation

## 2019-11-05 DIAGNOSIS — R61 Generalized hyperhidrosis: Secondary | ICD-10-CM | POA: Insufficient documentation

## 2019-11-05 DIAGNOSIS — C8128 Mixed cellularity classical Hodgkin lymphoma, lymph nodes of multiple sites: Secondary | ICD-10-CM | POA: Insufficient documentation

## 2019-11-05 DIAGNOSIS — Z79899 Other long term (current) drug therapy: Secondary | ICD-10-CM | POA: Insufficient documentation

## 2019-11-05 DIAGNOSIS — Q676 Pectus excavatum: Secondary | ICD-10-CM | POA: Insufficient documentation

## 2019-11-05 DIAGNOSIS — R918 Other nonspecific abnormal finding of lung field: Secondary | ICD-10-CM | POA: Insufficient documentation

## 2019-11-05 DIAGNOSIS — R634 Abnormal weight loss: Secondary | ICD-10-CM | POA: Insufficient documentation

## 2019-11-05 DIAGNOSIS — R945 Abnormal results of liver function studies: Secondary | ICD-10-CM | POA: Insufficient documentation

## 2019-11-05 DIAGNOSIS — G35 Multiple sclerosis: Secondary | ICD-10-CM | POA: Insufficient documentation

## 2019-11-05 DIAGNOSIS — R188 Other ascites: Secondary | ICD-10-CM | POA: Insufficient documentation

## 2019-11-05 NOTE — Telephone Encounter (Signed)
Called pt regarding missed appts today, no answer, unable to leave VM.  Alerted RN Lanelle Bal (MD Irene Limbo).

## 2019-11-05 NOTE — Telephone Encounter (Signed)
Attempted to call patient in regards to rescheduling today's missed appointments. No answer and unable to leave voicemail. Dr. Irene Limbo made aware.

## 2019-11-20 ENCOUNTER — Other Ambulatory Visit: Payer: Self-pay | Admitting: Hematology

## 2019-11-26 ENCOUNTER — Inpatient Hospital Stay: Payer: Medicaid Other

## 2019-11-26 ENCOUNTER — Other Ambulatory Visit: Payer: Self-pay

## 2019-11-26 ENCOUNTER — Inpatient Hospital Stay (HOSPITAL_BASED_OUTPATIENT_CLINIC_OR_DEPARTMENT_OTHER): Payer: Medicaid Other | Admitting: Hematology

## 2019-11-26 ENCOUNTER — Telehealth: Payer: Self-pay | Admitting: Hematology

## 2019-11-26 VITALS — BP 126/85 | HR 80 | Temp 98.0°F | Resp 18 | Ht 67.0 in | Wt 148.3 lb

## 2019-11-26 DIAGNOSIS — Z5112 Encounter for antineoplastic immunotherapy: Secondary | ICD-10-CM

## 2019-11-26 DIAGNOSIS — R61 Generalized hyperhidrosis: Secondary | ICD-10-CM | POA: Diagnosis not present

## 2019-11-26 DIAGNOSIS — R590 Localized enlarged lymph nodes: Secondary | ICD-10-CM | POA: Diagnosis not present

## 2019-11-26 DIAGNOSIS — Z7189 Other specified counseling: Secondary | ICD-10-CM

## 2019-11-26 DIAGNOSIS — Z9114 Patient's other noncompliance with medication regimen: Secondary | ICD-10-CM | POA: Diagnosis not present

## 2019-11-26 DIAGNOSIS — Q676 Pectus excavatum: Secondary | ICD-10-CM | POA: Diagnosis not present

## 2019-11-26 DIAGNOSIS — C8128 Mixed cellularity classical Hodgkin lymphoma, lymph nodes of multiple sites: Secondary | ICD-10-CM | POA: Diagnosis present

## 2019-11-26 DIAGNOSIS — Z95828 Presence of other vascular implants and grafts: Secondary | ICD-10-CM

## 2019-11-26 DIAGNOSIS — D649 Anemia, unspecified: Secondary | ICD-10-CM | POA: Diagnosis not present

## 2019-11-26 DIAGNOSIS — Z9119 Patient's noncompliance with other medical treatment and regimen: Secondary | ICD-10-CM | POA: Diagnosis not present

## 2019-11-26 DIAGNOSIS — Z7901 Long term (current) use of anticoagulants: Secondary | ICD-10-CM | POA: Diagnosis not present

## 2019-11-26 DIAGNOSIS — R188 Other ascites: Secondary | ICD-10-CM | POA: Diagnosis not present

## 2019-11-26 DIAGNOSIS — R945 Abnormal results of liver function studies: Secondary | ICD-10-CM | POA: Diagnosis not present

## 2019-11-26 DIAGNOSIS — B0229 Other postherpetic nervous system involvement: Secondary | ICD-10-CM | POA: Diagnosis not present

## 2019-11-26 DIAGNOSIS — Z79899 Other long term (current) drug therapy: Secondary | ICD-10-CM | POA: Diagnosis not present

## 2019-11-26 DIAGNOSIS — R509 Fever, unspecified: Secondary | ICD-10-CM | POA: Diagnosis not present

## 2019-11-26 DIAGNOSIS — R634 Abnormal weight loss: Secondary | ICD-10-CM | POA: Diagnosis not present

## 2019-11-26 DIAGNOSIS — C8198 Hodgkin lymphoma, unspecified, lymph nodes of multiple sites: Secondary | ICD-10-CM

## 2019-11-26 DIAGNOSIS — R918 Other nonspecific abnormal finding of lung field: Secondary | ICD-10-CM | POA: Diagnosis not present

## 2019-11-26 DIAGNOSIS — Z87891 Personal history of nicotine dependence: Secondary | ICD-10-CM | POA: Diagnosis not present

## 2019-11-26 DIAGNOSIS — G35 Multiple sclerosis: Secondary | ICD-10-CM | POA: Diagnosis not present

## 2019-11-26 LAB — CMP (CANCER CENTER ONLY)
ALT: 41 U/L (ref 0–44)
AST: 63 U/L — ABNORMAL HIGH (ref 15–41)
Albumin: 4.3 g/dL (ref 3.5–5.0)
Alkaline Phosphatase: 74 U/L (ref 38–126)
Anion gap: 11 (ref 5–15)
BUN: 13 mg/dL (ref 6–20)
CO2: 27 mmol/L (ref 22–32)
Calcium: 9.3 mg/dL (ref 8.9–10.3)
Chloride: 101 mmol/L (ref 98–111)
Creatinine: 0.91 mg/dL (ref 0.44–1.00)
GFR, Est AFR Am: >60 mL/min (ref >60)
GFR, Estimated: >60 mL/min (ref >60)
Glucose, Bld: 71 mg/dL (ref 70–99)
Potassium: 4.6 mmol/L (ref 3.5–5.1)
Sodium: 139 mmol/L (ref 135–145)
Total Bilirubin: 0.7 mg/dL (ref 0.3–1.2)
Total Protein: 7.2 g/dL (ref 6.5–8.1)

## 2019-11-26 LAB — CBC WITH DIFFERENTIAL/PLATELET
Abs Immature Granulocytes: 0.01 10*3/uL (ref 0.00–0.07)
Basophils Absolute: 0 10*3/uL (ref 0.0–0.1)
Basophils Relative: 0 %
Eosinophils Absolute: 0.1 10*3/uL (ref 0.0–0.5)
Eosinophils Relative: 2 %
HCT: 34.7 % — ABNORMAL LOW (ref 36.0–46.0)
Hemoglobin: 11.8 g/dL — ABNORMAL LOW (ref 12.0–15.0)
Immature Granulocytes: 0 %
Lymphocytes Relative: 11 %
Lymphs Abs: 0.4 10*3/uL — ABNORMAL LOW (ref 0.7–4.0)
MCH: 33 pg (ref 26.0–34.0)
MCHC: 34 g/dL (ref 30.0–36.0)
MCV: 96.9 fL (ref 80.0–100.0)
Monocytes Absolute: 0.5 10*3/uL (ref 0.1–1.0)
Monocytes Relative: 14 %
Neutro Abs: 2.5 10*3/uL (ref 1.7–7.7)
Neutrophils Relative %: 73 %
Platelets: 159 10*3/uL (ref 150–400)
RBC: 3.58 MIL/uL — ABNORMAL LOW (ref 3.87–5.11)
RDW: 12 % (ref 11.5–15.5)
WBC: 3.4 10*3/uL — ABNORMAL LOW (ref 4.0–10.5)
nRBC: 0 % (ref 0.0–0.2)

## 2019-11-26 LAB — SEDIMENTATION RATE: Sed Rate: 3 mm/hr (ref 0–22)

## 2019-11-26 LAB — TSH: TSH: 1.952 u[IU]/mL (ref 0.308–3.960)

## 2019-11-26 MED ORDER — HEPARIN SOD (PORK) LOCK FLUSH 100 UNIT/ML IV SOLN
500.0000 [IU] | Freq: Once | INTRAVENOUS | Status: AC | PRN
  Administered 2019-11-26: 500 [IU]
  Filled 2019-11-26: qty 5

## 2019-11-26 MED ORDER — DIPHENHYDRAMINE HCL 25 MG PO TABS
25.0000 mg | ORAL_TABLET | Freq: Once | ORAL | Status: AC
  Administered 2019-11-26: 25 mg via ORAL
  Filled 2019-11-26: qty 1

## 2019-11-26 MED ORDER — SODIUM CHLORIDE 0.9% FLUSH
10.0000 mL | INTRAVENOUS | Status: DC | PRN
  Administered 2019-11-26: 10 mL
  Filled 2019-11-26: qty 10

## 2019-11-26 MED ORDER — FAMOTIDINE 20 MG PO TABS
ORAL_TABLET | ORAL | Status: AC
  Filled 2019-11-26: qty 2

## 2019-11-26 MED ORDER — ACETAMINOPHEN 325 MG PO TABS
ORAL_TABLET | ORAL | Status: AC
  Filled 2019-11-26: qty 2

## 2019-11-26 MED ORDER — ACETAMINOPHEN 325 MG PO TABS
650.0000 mg | ORAL_TABLET | Freq: Once | ORAL | Status: AC
  Administered 2019-11-26: 650 mg via ORAL

## 2019-11-26 MED ORDER — SODIUM CHLORIDE 0.9 % IV SOLN
200.0000 mg | Freq: Once | INTRAVENOUS | Status: AC
  Administered 2019-11-26: 200 mg via INTRAVENOUS
  Filled 2019-11-26: qty 8

## 2019-11-26 MED ORDER — FAMOTIDINE 20 MG PO TABS
40.0000 mg | ORAL_TABLET | Freq: Once | ORAL | Status: AC
  Administered 2019-11-26: 40 mg via ORAL

## 2019-11-26 MED ORDER — SODIUM CHLORIDE 0.9 % IV SOLN
Freq: Once | INTRAVENOUS | Status: AC
  Administered 2019-11-26: 09:00:00 via INTRAVENOUS
  Filled 2019-11-26: qty 250

## 2019-11-26 MED ORDER — DIPHENHYDRAMINE HCL 25 MG PO CAPS
ORAL_CAPSULE | ORAL | Status: AC
  Filled 2019-11-26: qty 2

## 2019-11-26 NOTE — Patient Instructions (Signed)

## 2019-11-26 NOTE — Patient Instructions (Signed)
[facility] Discharge Instructions for Patients Receiving Chemotherapy  Today you received the following chemotherapy agents:  Keytruda.  To help prevent nausea and vomiting after your treatment, we encourage you to take your nausea medication as directed.   If you develop nausea and vomiting that is not controlled by your nausea medication, call the clinic.   BELOW ARE SYMPTOMS THAT SHOULD BE REPORTED IMMEDIATELY:  *FEVER GREATER THAN 100.5 F  *CHILLS WITH OR WITHOUT FEVER  NAUSEA AND VOMITING THAT IS NOT CONTROLLED WITH YOUR NAUSEA MEDICATION  *UNUSUAL SHORTNESS OF BREATH  *UNUSUAL BRUISING OR BLEEDING  TENDERNESS IN MOUTH AND THROAT WITH OR WITHOUT PRESENCE OF ULCERS  *URINARY PROBLEMS  *BOWEL PROBLEMS  UNUSUAL RASH Items with * indicate a potential emergency and should be followed up as soon as possible.  Feel free to call the clinic should you have any questions or concerns. The clinic phone number is [phone].  Please show the CHEMO ALERT CARD at check-in to the Emergency Department and triage nurse.    

## 2019-11-26 NOTE — Telephone Encounter (Signed)
Scheduled appt per 11/30 los.  Called infusion and the infusion nurse is going to give the pt an updated appt calendar.

## 2019-11-26 NOTE — Progress Notes (Signed)
HEMATOLOGY ONCOLOGY PROGRESS NOTE  Date of service:   11/26/19     Patient Care Team: Clinic, General Medical as PCP - General (Family Medicine)  Chief complaint: Follow-up for Hodgkin's lymphoma  Diagnosis:   Refractory Mixed cellularity Hodgkin's lymphoma IVBE with extensive lymphadenopathy including right axillary, mediastinal and upper retroperitoneal and now biopsy proven pulmonary involvement. She was noted to have significant constitutional symptoms including significant weight loss, fevers chills and some night sweats.  Current Treatment:  Pembrolizumab  Previous treatment 5 cycles of AVD (without bleomycin due to lung involvement and DLCO of 37%, active smoker) Multiple avoidable treatment delays due to the patient's noncompliance with follow-up for avoidable reasons. She has been counseled repeatedly that this would increase the likelihood of unfavorable outcome.  2nd line therapy with Bendamustine + Brentuximab s/p 7 cycles.  3rd line therapy with ICE s/p 2 cycles   INTERVAL HISTORY:  Ms Puccini is here for follow-up for her Hodgkins lymphoma for C13D1 Pembrolizumab treatment. The patient's last visit with [place] was on 10/15/2019. The pt reports that she is doing well overall.  The pt reports that she has been well in the interim and has recently celebrated her birthday safely with close family. She denies any new concerns at this time.   Lab results today (11/26/19) of CBC w/diff and CMP is as follows: all values are WNL except for WBC at 3.4K, RBC at 3.58, Hgb at 11.8, HCT at 34.7, Lymphs Abs at 0.4K, AST at 63.  On review of systems, pt denies fever, chills, rashes, diarrhea, new lumps/bumps, other infection issues, mouth sores, abdominal pain and any other symptoms.   REVIEW OF SYSTEMS:   A 10+ POINT REVIEW OF SYSTEMS WAS OBTAINED including neurology, dermatology, psychiatry, cardiac, respiratory, lymph, extremities, GI, GU, Musculoskeletal, constitutional,  breasts, reproductive, HEENT.  All pertinent positives are noted in the HPI.  All others are negative.  . Past Medical History:  Diagnosis Date  . ARDS (adult respiratory distress syndrome) ([place])   . Asthma   . Hodgkin lymphoma ([place])   . Hypertension     . Past Surgical History:  Procedure Laterality Date  . AXILLARY LYMPH NODE BIOPSY Right 03/19/2016   Procedure: AXILLARY LYMPH NODE BIOPSY;  Surgeon: Armandina Gemma, MD;  Location: [facility];  Service: General;  Laterality: Right;  . IR GENERIC HISTORICAL  12/22/2016   IR FLUORO GUIDE PORT INSERTION LEFT 12/22/2016 [facility]  . IR GENERIC HISTORICAL  12/22/2016   IR US GUIDE VASC ACCESS LEFT 12/22/2016 [facility]  . IR GENERIC HISTORICAL  12/22/2016   IR CV LINE INJECTION 12/22/2016 [facility]  . IR GENERIC HISTORICAL  12/22/2016   IR REMOVAL TUN ACCESS W/ PORT W/O FL MOD SED 12/22/2016 [facility]  . VIDEO BRONCHOSCOPY Bilateral 11/26/2016   Procedure: VIDEO BRONCHOSCOPY WITH FLUORO;  Surgeon: Rigoberto Noel, MD;  Location: [facility];  Service: Cardiopulmonary;  Laterality: Bilateral;    . Social History   Tobacco Use  . Smoking status: Former Smoker    Packs/day: 0.50    Years: 15.00    Pack years: 7.50    Types: Cigarettes    Quit date: 03/27/2016    Years since quitting: 3.6  . Smokeless tobacco: Never Used  Substance Use Topics  . Alcohol use: Yes    Comment: occasional now  . Drug use: Not on file    ALLERGIES:  has No Known Allergies.  MEDICATIONS:  Current Outpatient Medications  Medication Sig Dispense Refill  .  acyclovir (ZOVIRAX) 400 MG tablet Take 1 tablet (400 mg total) by mouth 2 (two) times daily. Start after completion of Valtrex 60 tablet 6  . amitriptyline (ELAVIL) 10 MG tablet Take 1 tablet (10 mg total) by mouth at bedtime. (Patient not taking: Reported on 11/14/2018) 20 tablet 0  . gabapentin (NEURONTIN) 300 MG capsule Take 1 capsule (300 mg total) by mouth 2 (two) times daily. 60 capsule 0   . oxyCODONE-acetaminophen (PERCOCET/ROXICET) 5-325 MG tablet Take 1-2 tablets by mouth every 6 (six) hours as needed for severe pain. 50 tablet 0   No current facility-administered medications for this visit.    Facility-Administered Medications Ordered in Other Visits  Medication Dose Route Frequency Provider Last Rate Last Dose  . sodium chloride flush (NS) 0.9 % injection 10 mL  10 mL Intracatheter PRN Brunetta Genera, MD        PHYSICAL EXAMINATION: ECOG PERFORMANCE STATUS: 1 - Symptomatic but completely ambulatory  Vitals:   11/26/19 0850  BP: 126/85  Pulse: 80  Resp: 18  Temp: 98 F (36.7 C)  SpO2: 98%    GENERAL:alert, in no acute distress and comfortable SKIN: no acute rashes, no significant lesions EYES: conjunctiva are pink and non-injected, sclera anicteric OROPHARYNX: MMM, no exudates, no oropharyngeal erythema or ulceration NECK: supple, no JVD LYMPH:  no palpable lymphadenopathy in the cervical, axillary or inguinal regions LUNGS: clear to auscultation b/l with normal respiratory effort HEART: regular rate & rhythm ABDOMEN:  normoactive bowel sounds , non tender, not distended. No palpable hepatosplenomegaly.  Extremity: no pedal edema PSYCH: alert & oriented x 3 with fluent speech NEURO: no focal motor/sensory deficits  LABORATORY DATA:   I have reviewed the data as listed  . CBC Latest Ref Rng & Units 11/26/2019 10/15/2019 09/24/2019  WBC 4.0 - 10.5 K/uL 3.4(L) 3.8(L) 3.4(L)  Hemoglobin 12.0 - 15.0 g/dL 11.8(L) 12.1 11.2(L)  Hematocrit 36.0 - 46.0 % 34.7(L) 35.3(L) 33.6(L)  Platelets 150 - 400 K/uL 159 226 200   . CBC    Component Value Date/Time   WBC 3.4 (L) 11/26/2019 0810   RBC 3.58 (L) 11/26/2019 0810   HGB 11.8 (L) 11/26/2019 0810   HGB 10.6 (L) 11/07/2018 1047   HGB 11.7 07/20/2017 1315   HCT 34.7 (L) 11/26/2019 0810   HCT 35.2 07/20/2017 1315   PLT 159 11/26/2019 0810   PLT 473 (H) 11/07/2018 1047   PLT 266 07/20/2017 1315    MCV 96.9 11/26/2019 0810   MCV 93.2 07/20/2017 1315   MCH 33.0 11/26/2019 0810   MCHC 34.0 11/26/2019 0810   RDW 12.0 11/26/2019 0810   RDW 14.5 07/20/2017 1315   LYMPHSABS 0.4 (L) 11/26/2019 0810   LYMPHSABS 0.3 (L) 07/20/2017 1315   MONOABS 0.5 11/26/2019 0810   MONOABS 0.5 07/20/2017 1315   EOSABS 0.1 11/26/2019 0810   EOSABS 0.2 07/20/2017 1315   BASOSABS 0.0 11/26/2019 0810   BASOSABS 0.0 07/20/2017 1315   . CMP Latest Ref Rng & Units 11/26/2019 10/15/2019 09/24/2019  Glucose 70 - 99 mg/dL 71 75 82  BUN 6 - 20 mg/dL 13 11 14   Creatinine 0.44 - 1.00 mg/dL 0.91 0.98 0.84  Sodium 135 - 145 mmol/L 139 137 138  Potassium 3.5 - 5.1 mmol/L 4.6 4.0 4.3  Chloride 98 - 111 mmol/L 101 101 104  CO2 22 - 32 mmol/L 27 25 26   Calcium 8.9 - 10.3 mg/dL 9.3 9.2 8.9  Total Protein 6.5 - 8.1 g/dL 7.2 7.1 6.9  Total Bilirubin 0.3 - 1.2 mg/dL 0.7 0.9 0.4  Alkaline Phos 38 - 126 U/L 74 62 72  AST 15 - 41 U/L 63(H) 43(H) 41  ALT 0 - 44 U/L 41 23 30     RADIOGRAPHIC STUDIES: I have personally reviewed the radiological images as listed and agreed with the findings in the report. No results found.  ASSESSMENT & PLAN:   33 y.o. 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 She was noted to have significant constitutional symptoms including significant weight loss, fevers chills and some night sweats. HIV negative Hepatitis C and hepatitis B serologies negative. Echo with normal ejection fraction. Patient was treated with 5 cycles of AVD (Bleomycin held due to poor DLCO 37% and ongoing smoking). Multiple avoidable treatment delays due to the patient's noncompliance with follow-up for avoidable reasons. She has been counseled repeatedly that this would increase the likelihood of unfavorable outcome.   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 S/p 6 cycles of 2nd line treatment with Bendamustine/Brentuximab And 1 cycle of Bretuximab alone  -PET/CT scan results from 04/14/2017 were discussed in details. She appears to have some persistent disease in her right lung at Deauville 5. It is difficult to say if this is recurrent or persistent disease since the patient failed to follow-up on multiple scheduled PET/CT scans in the early part and prior to her second line treatment.  Patient was lost to followup for >1 yr  09/26/18 PET/CT revealed Imaging findings compatible with recurrence of disease. 2. Multiple new large areas of hypermetabolic nodularity and airspace consolidation within both lungs which is presumed to represent pulmonary involvement by lymphoma. Deauville criteria 5. 3. New hypermetabolic left supraclavicular, left retroperitoneal, and bilateral pelvic lymph nodes. Deauville criteria 5. 4. Multifocal hypermetabolic osseous lesions. Deauville criteria 5. 5. Small volume of ascites, new.   12/06/18 PET/CT revealed Generally improved appearance with previously mostly Deauville 5 activity now mostly Deauville 4 activity. 2. Layering of much of the airspace opacity in the lungs, although considerable right perihilar airspace opacity remains. The remaining pulmonary opacities are assess this Deauville 5 on the right and over L4 on the left, and were previously Deauville 5 bilaterally. 3. Mildly reduced size and moderately reduced activity in the abdominopelvic lymph nodes which are now dove L4. 4. The previously seen left supraclavicular lymph node seems to have completely resolved (Deauville 0). 5. The skeletal lesions remain at Deauville 4, although in absolute terms have decreased in SUV compared to previous. 6. No new regions of malignant involvement compared to prior exam. 7. Other imaging findings of potential clinical significance: Low-density blood pool  suggests anemia. Pectus excavatum. Volume loss in the right hemithorax.  06/13/19 PET/CT revealed "No abnormal hypermetabolism (Deauville 1). 2. Mixed lytic and sclerotic osseous lesions appear more prominent than on 12/06/2018 but do not have associated hypermetabolism, indicative of interval healing."  #2 s/p hypoxic respiratory failurewith dense right lung consolidation and left upper lobe consolidation with SVC syndrome. Patient has completed palliative radiation to the right lung mass causing SVC compression.  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  #4 Non compliancewith clinic and treatment followup. Missed 2nd dose of bendamustine with C2. Has missed multiple appointment for her  PET/CT and missed appointment at [facility] for consideration of Transplant.  Pt was lost to follow up after July 2018 and returned on 08/31/18 Discussed the patient's goals of care and the pt noted that she is ready to begin treatment again and maintain compliance and follow ups   #5 h/o Grade 1 neuropathyfrom Brentuximab-Vedotin - resolved  #6 Shingles outbreak - curentlyresolved but with significant post herpetic neuralgia  #7 Significant anemia and thrombocytopenia after C1 of ICE -- will need close monitoring.  #8 DVT Prophylaxis - lovenox  #9 Abnormal LFTs ? Related to Pembrolizumab - stable today  PLAN: -Discussed pt labwork today, 11/26/19 -Pt has not new lymphadenopathy, no hepatosplenomegaly  -No lab or clincal findings of progression of pt's Hodgkins lymphoma  -The pt has no prohibitive toxicities from continuing C13D1 Pembrolizumab at this time.  -Continue Acyclovirfor shingles prophylaxis -Will repeat CT every 6 months unless new symptoms arise. (likely in Jan 2021) -Will monitor blood chemistries/LFTs  -Will see back in 3 weeks with next treatment and labs    FOLLOW UP: Please schedule next 3 cycles of Pembrolizumab with labs and MD visits  The total time spent in the appt was 15 minutes and more than 50% was on counseling and direct patient cares.  All of the patient's questions were answered with apparent satisfaction. The patient knows to call the clinic with any problems, questions or concerns.    Sullivan Lone MD Coco AAHIVMS [facility] Hematology/Oncology Physician [facility]  (Office):       [phone] (Work cell):  [phone] (Fax):           [phone]  I, Yevette Edwards, am acting as a scribe for Dr. Sullivan Lone.   I have reviewed the above documentation for accuracy and completeness, and I agree with the above. Brunetta Genera MD

## 2019-12-06 ENCOUNTER — Telehealth: Payer: Self-pay | Admitting: *Deleted

## 2019-12-06 NOTE — Telephone Encounter (Signed)
Patient called - she has applied for unemployment. She said they have asked for documentation from her doctor that verifies she is out of work due to Hodgkin Lymphoma. She said they have given her some documents and a fax # [phone]. Message sent to staff responsible for disability/fmla asking them to speak with her and determine what she needs.

## 2019-12-17 ENCOUNTER — Inpatient Hospital Stay (HOSPITAL_BASED_OUTPATIENT_CLINIC_OR_DEPARTMENT_OTHER): Payer: Medicaid Other | Admitting: Hematology

## 2019-12-17 ENCOUNTER — Inpatient Hospital Stay: Payer: Medicaid Other

## 2019-12-17 ENCOUNTER — Inpatient Hospital Stay: Payer: Medicaid Other | Attending: Hematology

## 2019-12-17 ENCOUNTER — Other Ambulatory Visit: Payer: Self-pay

## 2019-12-17 VITALS — BP 125/77 | HR 80 | Temp 98.4°F | Resp 17 | Ht 67.0 in | Wt 150.3 lb

## 2019-12-17 DIAGNOSIS — Z7189 Other specified counseling: Secondary | ICD-10-CM

## 2019-12-17 DIAGNOSIS — Z7901 Long term (current) use of anticoagulants: Secondary | ICD-10-CM | POA: Diagnosis not present

## 2019-12-17 DIAGNOSIS — C8128 Mixed cellularity classical Hodgkin lymphoma, lymph nodes of multiple sites: Secondary | ICD-10-CM | POA: Diagnosis present

## 2019-12-17 DIAGNOSIS — D649 Anemia, unspecified: Secondary | ICD-10-CM | POA: Diagnosis not present

## 2019-12-17 DIAGNOSIS — R918 Other nonspecific abnormal finding of lung field: Secondary | ICD-10-CM | POA: Insufficient documentation

## 2019-12-17 DIAGNOSIS — R634 Abnormal weight loss: Secondary | ICD-10-CM | POA: Insufficient documentation

## 2019-12-17 DIAGNOSIS — Z87891 Personal history of nicotine dependence: Secondary | ICD-10-CM | POA: Insufficient documentation

## 2019-12-17 DIAGNOSIS — G35 Multiple sclerosis: Secondary | ICD-10-CM | POA: Insufficient documentation

## 2019-12-17 DIAGNOSIS — C8198 Hodgkin lymphoma, unspecified, lymph nodes of multiple sites: Secondary | ICD-10-CM

## 2019-12-17 DIAGNOSIS — Z86718 Personal history of other venous thrombosis and embolism: Secondary | ICD-10-CM | POA: Insufficient documentation

## 2019-12-17 DIAGNOSIS — Z95828 Presence of other vascular implants and grafts: Secondary | ICD-10-CM

## 2019-12-17 DIAGNOSIS — Q676 Pectus excavatum: Secondary | ICD-10-CM | POA: Diagnosis not present

## 2019-12-17 DIAGNOSIS — R6883 Chills (without fever): Secondary | ICD-10-CM | POA: Diagnosis not present

## 2019-12-17 DIAGNOSIS — Z79899 Other long term (current) drug therapy: Secondary | ICD-10-CM | POA: Insufficient documentation

## 2019-12-17 DIAGNOSIS — Z5112 Encounter for antineoplastic immunotherapy: Secondary | ICD-10-CM | POA: Diagnosis not present

## 2019-12-17 DIAGNOSIS — B0229 Other postherpetic nervous system involvement: Secondary | ICD-10-CM | POA: Diagnosis not present

## 2019-12-17 DIAGNOSIS — R945 Abnormal results of liver function studies: Secondary | ICD-10-CM | POA: Diagnosis not present

## 2019-12-17 DIAGNOSIS — R61 Generalized hyperhidrosis: Secondary | ICD-10-CM | POA: Diagnosis not present

## 2019-12-17 DIAGNOSIS — R188 Other ascites: Secondary | ICD-10-CM | POA: Diagnosis not present

## 2019-12-17 LAB — CBC WITH DIFFERENTIAL/PLATELET
Abs Immature Granulocytes: 0.01 10*3/uL (ref 0.00–0.07)
Basophils Absolute: 0 10*3/uL (ref 0.0–0.1)
Basophils Relative: 0 %
Eosinophils Absolute: 0.1 10*3/uL (ref 0.0–0.5)
Eosinophils Relative: 3 %
HCT: 36.6 % (ref 36.0–46.0)
Hemoglobin: 12.3 g/dL (ref 12.0–15.0)
Immature Granulocytes: 0 %
Lymphocytes Relative: 12 %
Lymphs Abs: 0.4 10*3/uL — ABNORMAL LOW (ref 0.7–4.0)
MCH: 33.2 pg (ref 26.0–34.0)
MCHC: 33.6 g/dL (ref 30.0–36.0)
MCV: 98.7 fL (ref 80.0–100.0)
Monocytes Absolute: 0.5 10*3/uL (ref 0.1–1.0)
Monocytes Relative: 14 %
Neutro Abs: 2.6 10*3/uL (ref 1.7–7.7)
Neutrophils Relative %: 71 %
Platelets: 197 10*3/uL (ref 150–400)
RBC: 3.71 MIL/uL — ABNORMAL LOW (ref 3.87–5.11)
RDW: 12 % (ref 11.5–15.5)
WBC: 3.7 10*3/uL — ABNORMAL LOW (ref 4.0–10.5)
nRBC: 0 % (ref 0.0–0.2)

## 2019-12-17 LAB — CMP (CANCER CENTER ONLY)
ALT: 18 U/L (ref 0–44)
AST: 22 U/L (ref 15–41)
Albumin: 4.1 g/dL (ref 3.5–5.0)
Alkaline Phosphatase: 57 U/L (ref 38–126)
Anion gap: 9 (ref 5–15)
BUN: 9 mg/dL (ref 6–20)
CO2: 29 mmol/L (ref 22–32)
Calcium: 9.1 mg/dL (ref 8.9–10.3)
Chloride: 103 mmol/L (ref 98–111)
Creatinine: 0.84 mg/dL (ref 0.44–1.00)
GFR, Est AFR Am: >60 mL/min (ref >60)
GFR, Estimated: >60 mL/min (ref >60)
Glucose, Bld: 45 mg/dL — ABNORMAL LOW (ref 70–99)
Potassium: 4.4 mmol/L (ref 3.5–5.1)
Sodium: 141 mmol/L (ref 135–145)
Total Bilirubin: 0.4 mg/dL (ref 0.3–1.2)
Total Protein: 7 g/dL (ref 6.5–8.1)

## 2019-12-17 LAB — SEDIMENTATION RATE: Sed Rate: 2 mm/hr (ref 0–22)

## 2019-12-17 MED ORDER — SODIUM CHLORIDE 0.9% FLUSH
10.0000 mL | INTRAVENOUS | Status: DC | PRN
  Administered 2019-12-17: 10 mL
  Filled 2019-12-17: qty 10

## 2019-12-17 MED ORDER — ACETAMINOPHEN 325 MG PO TABS
650.0000 mg | ORAL_TABLET | Freq: Once | ORAL | Status: AC
  Administered 2019-12-17: 650 mg via ORAL

## 2019-12-17 MED ORDER — HEPARIN SOD (PORK) LOCK FLUSH 100 UNIT/ML IV SOLN
500.0000 [IU] | Freq: Once | INTRAVENOUS | Status: AC | PRN
  Administered 2019-12-17: 500 [IU]
  Filled 2019-12-17: qty 5

## 2019-12-17 MED ORDER — SODIUM CHLORIDE 0.9 % IV SOLN
Freq: Once | INTRAVENOUS | Status: AC
  Filled 2019-12-17: qty 250

## 2019-12-17 MED ORDER — ACETAMINOPHEN 325 MG PO TABS
ORAL_TABLET | ORAL | Status: AC
  Filled 2019-12-17: qty 2

## 2019-12-17 MED ORDER — FAMOTIDINE 20 MG PO TABS
ORAL_TABLET | ORAL | Status: AC
  Filled 2019-12-17: qty 2

## 2019-12-17 MED ORDER — DIPHENHYDRAMINE HCL 25 MG PO CAPS
ORAL_CAPSULE | ORAL | Status: AC
  Filled 2019-12-17: qty 1

## 2019-12-17 MED ORDER — FAMOTIDINE 20 MG PO TABS
40.0000 mg | ORAL_TABLET | Freq: Once | ORAL | Status: AC
  Administered 2019-12-17: 40 mg via ORAL

## 2019-12-17 MED ORDER — SODIUM CHLORIDE 0.9 % IV SOLN
200.0000 mg | Freq: Once | INTRAVENOUS | Status: AC
  Administered 2019-12-17: 200 mg via INTRAVENOUS
  Filled 2019-12-17: qty 8

## 2019-12-17 MED ORDER — DIPHENHYDRAMINE HCL 25 MG PO CAPS
25.0000 mg | ORAL_CAPSULE | Freq: Once | ORAL | Status: AC
  Administered 2019-12-17: 25 mg via ORAL

## 2019-12-17 NOTE — Patient Instructions (Signed)
[facility] Discharge Instructions for Patients Receiving Chemotherapy  Today you received the following chemotherapy agents:  Keytruda.  To help prevent nausea and vomiting after your treatment, we encourage you to take your nausea medication as directed.   If you develop nausea and vomiting that is not controlled by your nausea medication, call the clinic.   BELOW ARE SYMPTOMS THAT SHOULD BE REPORTED IMMEDIATELY:  *FEVER GREATER THAN 100.5 F  *CHILLS WITH OR WITHOUT FEVER  NAUSEA AND VOMITING THAT IS NOT CONTROLLED WITH YOUR NAUSEA MEDICATION  *UNUSUAL SHORTNESS OF BREATH  *UNUSUAL BRUISING OR BLEEDING  TENDERNESS IN MOUTH AND THROAT WITH OR WITHOUT PRESENCE OF ULCERS  *URINARY PROBLEMS  *BOWEL PROBLEMS  UNUSUAL RASH Items with * indicate a potential emergency and should be followed up as soon as possible.  Feel free to call the clinic should you have any questions or concerns. The clinic phone number is [phone].  Please show the CHEMO ALERT CARD at check-in to the Emergency Department and triage nurse.    

## 2019-12-17 NOTE — Progress Notes (Signed)
HEMATOLOGY ONCOLOGY PROGRESS NOTE  Date of service:   12/17/19     Patient Care Team: Clinic, General Medical as PCP - General (Family Medicine)  Chief complaint: Follow-up for Hodgkin's lymphoma  Diagnosis:   Refractory Mixed cellularity Hodgkin's lymphoma IVBE with extensive lymphadenopathy including right axillary, mediastinal and upper retroperitoneal and now biopsy proven pulmonary involvement. She was noted to have significant constitutional symptoms including significant weight loss, fevers chills and some night sweats.  Current Treatment:  Pembrolizumab  Previous treatment 5 cycles of AVD (without bleomycin due to lung involvement and DLCO of 37%, active smoker) Multiple avoidable treatment delays due to the patient's noncompliance with follow-up for avoidable reasons. She has been counseled repeatedly that this would increase the likelihood of unfavorable outcome.  2nd line therapy with Bendamustine + Brentuximab s/p 7 cycles.  3rd line therapy with ICE s/p 2 cycles   INTERVAL HISTORY:  Heidi [name] is here for follow-up for her Hodgkins lymphoma for C14D1 Pembrolizumab treatment. The patient's last visit with [place] was on 11/26/2019. The pt reports that she is doing well overall.  The pt reports that she has been feeling well and eating well. She denies any new concerns. She has not had any issues with SOB while walking. Her blood glucose is low today as she was not able to eat before coming in. Pt has been staying safe in the pandemic and will be limiting visitors for Christmas.   Lab results today (12/17/19) of CBC w/diff and CMP is as follows: all values are WNL except for WBC at 3.7K, RBC at 3.71, Lymphs Abs at 0.4K, Glucose at 45. 12/17/2019 Sed Rate at 2  On review of systems, pt reports eating well and denies fevers, chills, night sweats, SOB, unexpected weight loss, abdominal pain, leg swelling and any other symptoms.    REVIEW OF SYSTEMS:   A 10+ POINT REVIEW  OF SYSTEMS WAS OBTAINED including neurology, dermatology, psychiatry, cardiac, respiratory, lymph, extremities, GI, GU, Musculoskeletal, constitutional, breasts, reproductive, HEENT.  All pertinent positives are noted in the HPI.  All others are negative.  . Past Medical History:  Diagnosis Date  . ARDS (adult respiratory distress syndrome) ([place])   . Asthma   . Hodgkin lymphoma ([place])   . Hypertension     . Past Surgical History:  Procedure Laterality Date  . AXILLARY LYMPH NODE BIOPSY Right 03/19/2016   Procedure: AXILLARY LYMPH NODE BIOPSY;  Surgeon: Armandina Gemma, MD;  Location: [facility];  Service: General;  Laterality: Right;  . IR GENERIC HISTORICAL  12/22/2016   IR FLUORO GUIDE PORT INSERTION LEFT 12/22/2016 [facility]  . IR GENERIC HISTORICAL  12/22/2016   IR US GUIDE VASC ACCESS LEFT 12/22/2016 [facility]  . IR GENERIC HISTORICAL  12/22/2016   IR CV LINE INJECTION 12/22/2016 [facility]  . IR GENERIC HISTORICAL  12/22/2016   IR REMOVAL TUN ACCESS W/ PORT W/O FL MOD SED 12/22/2016 [facility]  . VIDEO BRONCHOSCOPY Bilateral 11/26/2016   Procedure: VIDEO BRONCHOSCOPY WITH FLUORO;  Surgeon: Rigoberto Noel, MD;  Location: [facility];  Service: Cardiopulmonary;  Laterality: Bilateral;    . Social History   Tobacco Use  . Smoking status: Former Smoker    Packs/day: 0.50    Years: 15.00    Pack years: 7.50    Types: Cigarettes    Quit date: 03/27/2016    Years since quitting: 3.7  . Smokeless tobacco: Never Used  Substance Use Topics  . Alcohol use: Yes  Comment: occasional now  . Drug use: Not on file    ALLERGIES:  has No Known Allergies.  MEDICATIONS:  Current Outpatient Medications  Medication Sig Dispense Refill  . acyclovir (ZOVIRAX) 400 MG tablet Take 1 tablet (400 mg total) by mouth 2 (two) times daily. Start after completion of Valtrex 60 tablet 6  . amitriptyline (ELAVIL) 10 MG tablet Take 1 tablet (10 mg total) by mouth at bedtime. 20 tablet 0  .  gabapentin (NEURONTIN) 300 MG capsule Take 1 capsule (300 mg total) by mouth 2 (two) times daily. 60 capsule 0  . oxyCODONE-acetaminophen (PERCOCET/ROXICET) 5-325 MG tablet Take 1-2 tablets by mouth every 6 (six) hours as needed for severe pain. 50 tablet 0   No current facility-administered medications for this visit.   Facility-Administered Medications Ordered in Other Visits  Medication Dose Route Frequency Provider Last Rate Last Admin  . heparin lock flush 100 unit/mL  500 Units Intracatheter Once PRN Brunetta Genera, MD      . pembrolizumab [facility]) 200 mg in sodium chloride 0.9 % 50 mL chemo infusion  200 mg Intravenous Once Brunetta Genera, MD      . sodium chloride flush (NS) 0.9 % injection 10 mL  10 mL Intracatheter PRN Brunetta Genera, MD      . sodium chloride flush (NS) 0.9 % injection 10 mL  10 mL Intracatheter PRN Brunetta Genera, MD        PHYSICAL EXAMINATION: ECOG PERFORMANCE STATUS: 1 - Symptomatic but completely ambulatory  Vitals:   12/17/19 0957  BP: 125/77  Pulse: 80  Resp: 17  Temp: 98.4 F (36.9 C)  SpO2: 100%     GENERAL:alert, in no acute distress and comfortable SKIN: no acute rashes, no significant lesions EYES: conjunctiva are pink and non-injected, sclera anicteric OROPHARYNX: MMM, no exudates, no oropharyngeal erythema or ulceration NECK: supple, no JVD LYMPH:  no palpable lymphadenopathy in the cervical, axillary or inguinal regions LUNGS: clear to auscultation b/l with normal respiratory effort HEART: regular rate & rhythm ABDOMEN:  normoactive bowel sounds , non tender, not distended. No palpable hepatosplenomegaly.  Extremity: no pedal edema PSYCH: alert & oriented x 3 with fluent speech NEURO: no focal motor/sensory deficits  LABORATORY DATA:   I have reviewed the data as listed  . CBC Latest Ref Rng & Units 12/17/2019 11/26/2019 10/15/2019  WBC 4.0 - 10.5 K/uL 3.7(L) 3.4(L) 3.8(L)  Hemoglobin 12.0 - 15.0 g/dL  12.3 11.8(L) 12.1  Hematocrit 36.0 - 46.0 % 36.6 34.7(L) 35.3(L)  Platelets 150 - 400 K/uL 197 159 226   . CBC    Component Value Date/Time   WBC 3.7 (L) 12/17/2019 0918   RBC 3.71 (L) 12/17/2019 0918   HGB 12.3 12/17/2019 0918   HGB 10.6 (L) 11/07/2018 1047   HGB 11.7 07/20/2017 1315   HCT 36.6 12/17/2019 0918   HCT 35.2 07/20/2017 1315   PLT 197 12/17/2019 0918   PLT 473 (H) 11/07/2018 1047   PLT 266 07/20/2017 1315   MCV 98.7 12/17/2019 0918   MCV 93.2 07/20/2017 1315   MCH 33.2 12/17/2019 0918   MCHC 33.6 12/17/2019 0918   RDW 12.0 12/17/2019 0918   RDW 14.5 07/20/2017 1315   LYMPHSABS 0.4 (L) 12/17/2019 0918   LYMPHSABS 0.3 (L) 07/20/2017 1315   MONOABS 0.5 12/17/2019 0918   MONOABS 0.5 07/20/2017 1315   EOSABS 0.1 12/17/2019 0918   EOSABS 0.2 07/20/2017 1315   BASOSABS 0.0 12/17/2019 0918   BASOSABS  0.0 07/20/2017 1315   . CMP Latest Ref Rng & Units 12/17/2019 11/26/2019 10/15/2019  Glucose 70 - 99 mg/dL 45(L) 71 75  BUN 6 - 20 mg/dL 9 13 11   Creatinine 0.44 - 1.00 mg/dL 0.84 0.91 0.98  Sodium 135 - 145 mmol/L 141 139 137  Potassium 3.5 - 5.1 mmol/L 4.4 4.6 4.0  Chloride 98 - 111 mmol/L 103 101 101  CO2 22 - 32 mmol/L 29 27 25   Calcium 8.9 - 10.3 mg/dL 9.1 9.3 9.2  Total Protein 6.5 - 8.1 g/dL 7.0 7.2 7.1  Total Bilirubin 0.3 - 1.2 mg/dL 0.4 0.7 0.9  Alkaline Phos 38 - 126 U/L 57 74 62  AST 15 - 41 U/L 22 63(H) 43(H)  ALT 0 - 44 U/L 18 41 23     RADIOGRAPHIC STUDIES: I have personally reviewed the radiological images as listed and agreed with the findings in the report. No results found.  ASSESSMENT & PLAN:   33 y.o. 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 She was noted to have significant constitutional symptoms including significant weight loss, fevers  chills and some night sweats. HIV negative Hepatitis C and hepatitis B serologies negative. Echo with normal ejection fraction. Patient was treated with 5 cycles of AVD (Bleomycin held due to poor DLCO 37% and ongoing smoking). Multiple avoidable treatment delays due to the patient's noncompliance with follow-up for avoidable reasons. She has been counseled repeatedly that this would increase the likelihood of unfavorable outcome.  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 S/p 6 cycles of 2nd line treatment with Bendamustine/Brentuximab And 1 cycle of Bretuximab alone  -PET/CT scan results from 04/14/2017 were discussed in details. She appears to have some persistent disease in her right lung at Deauville 5. It is difficult to say if this is recurrent or persistent disease since the patient failed to follow-up on multiple scheduled PET/CT scans in the early part and prior to her second line treatment.  Patient was lost to followup for >1 yr  09/26/18 PET/CT revealed Imaging findings compatible with recurrence of disease. 2. Multiple new large areas of hypermetabolic nodularity and airspace consolidation within both lungs which is presumed to represent pulmonary involvement by lymphoma. Deauville criteria 5. 3. New hypermetabolic left supraclavicular, left retroperitoneal, and bilateral pelvic lymph nodes. Deauville criteria 5. 4. Multifocal hypermetabolic osseous lesions. Deauville criteria 5. 5. Small volume of ascites, new.   12/06/18 PET/CT revealed Generally improved appearance with previously mostly Deauville 5 activity now mostly Deauville 4 activity. 2. Layering of much of the airspace opacity in the lungs, although considerable right perihilar airspace opacity remains. The remaining pulmonary opacities are assess this Deauville 5 on the right and over L4 on the left, and were previously Deauville 5 bilaterally. 3. Mildly reduced size and moderately reduced  activity in the abdominopelvic lymph nodes which are now dove L4. 4. The previously seen left supraclavicular lymph node seems to have completely resolved (Deauville 0). 5. The skeletal lesions remain at Deauville 4, although in absolute terms have decreased in SUV compared to previous. 6. No new regions of malignant involvement compared to prior exam. 7. Other imaging findings of potential clinical significance: Low-density blood pool suggests anemia. Pectus excavatum. Volume loss in the right hemithorax.  06/13/19 PET/CT revealed "No abnormal hypermetabolism (Deauville 1). 2. Mixed lytic and sclerotic osseous lesions appear more prominent  than on 12/06/2018 but do not have associated hypermetabolism, indicative of interval healing."  #2 s/p hypoxic respiratory failurewith dense right lung consolidation and left upper lobe consolidation with SVC syndrome. Patient has completed palliative radiation to the right lung mass causing SVC compression.  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  #4 Non compliancewith clinic and treatment followup. Missed 2nd dose of bendamustine with C2. Has missed multiple appointment for her PET/CT and missed appointment at [facility] for consideration of Transplant.  Pt was lost to follow up after July 2018 and returned on 08/31/18 Discussed the patient's goals of care and the pt noted that she is ready to begin treatment again and maintain compliance and follow ups   #5 h/o Grade 1 neuropathyfrom Brentuximab-Vedotin - resolved  #6 Shingles outbreak - curentlyresolved but with significant post herpetic neuralgia  #7 Significant anemia and thrombocytopenia after C1 of ICE -- will need close monitoring.  #8 DVT Prophylaxis - lovenox  #9 Abnormal LFTs ? Related to Pembrolizumab - stable  today  PLAN: -Discussed pt labwork today, 12/17/19; blood counts are stable, blood chemistries look okay, Glucose is low, AST has normalized  -Discussed 12/17/2019 Sed Rate at 2 -Pt has no new lymphadenopathy, no hepatosplenomegaly  -No lab or clincal findings of progression of pt's Hodgkins lymphoma  -The pt has no prohibitive toxicities from continuing C14D1 Pembrolizumab at this time.  -Continue Acyclovirfor shingles prophylaxis -Will monitor blood chemistries/LFTs -Will get a rpt PET/CT scan in 2 weeks -Will see back in 3 weeks with labs  FOLLOW UP: PET/CT in 2 weeks Please schedule next cycle of Pembrolizumab with labs and MD visit in 3 weeks   The total time spent in the appt was 25 minutes and more than 50% was on counseling and direct patient cares.  All of the patient's questions were answered with apparent satisfaction. The patient knows to call the clinic with any problems, questions or concerns.    Sullivan Lone MD [place] AAHIVMS [place] [facility] Hematology/Oncology Physician [facility]  (Office):       [phone] (Work cell):  [phone] (Fax):           [phone]  I, Yevette Edwards, am acting as a scribe for Dr. Sullivan Lone.   .I have reviewed the above documentation for accuracy and completeness, and I agree with the above. Brunetta Genera MD

## 2019-12-18 ENCOUNTER — Telehealth: Payer: Self-pay | Admitting: Hematology

## 2019-12-18 NOTE — Telephone Encounter (Signed)
Per 12/21 los, appts already scheduled.

## 2019-12-19 ENCOUNTER — Other Ambulatory Visit: Payer: Self-pay

## 2019-12-19 ENCOUNTER — Emergency Department (HOSPITAL_COMMUNITY)
Admission: EM | Admit: 2019-12-19 | Discharge: 2019-12-19 | Disposition: A | Discharge status: Home / Self Care | Payer: Medicaid Other | Attending: Emergency Medicine | Admitting: Emergency Medicine

## 2019-12-19 ENCOUNTER — Encounter (HOSPITAL_COMMUNITY): Payer: Self-pay | Admitting: Emergency Medicine

## 2019-12-19 DIAGNOSIS — S01501A Unspecified open wound of lip, initial encounter: Secondary | ICD-10-CM | POA: Insufficient documentation

## 2019-12-19 DIAGNOSIS — W540XXA Bitten by dog, initial encounter: Secondary | ICD-10-CM | POA: Insufficient documentation

## 2019-12-19 DIAGNOSIS — I10 Essential (primary) hypertension: Secondary | ICD-10-CM | POA: Insufficient documentation

## 2019-12-19 DIAGNOSIS — Y9289 Other specified places as the place of occurrence of the external cause: Secondary | ICD-10-CM | POA: Insufficient documentation

## 2019-12-19 DIAGNOSIS — C8190 Hodgkin lymphoma, unspecified, unspecified site: Secondary | ICD-10-CM | POA: Diagnosis not present

## 2019-12-19 DIAGNOSIS — Z79899 Other long term (current) drug therapy: Secondary | ICD-10-CM | POA: Insufficient documentation

## 2019-12-19 DIAGNOSIS — Y999 Unspecified external cause status: Secondary | ICD-10-CM | POA: Diagnosis not present

## 2019-12-19 DIAGNOSIS — S0185XA Open bite of other part of head, initial encounter: Secondary | ICD-10-CM

## 2019-12-19 DIAGNOSIS — S01511A Laceration without foreign body of lip, initial encounter: Secondary | ICD-10-CM

## 2019-12-19 DIAGNOSIS — Y9389 Activity, other specified: Secondary | ICD-10-CM | POA: Diagnosis not present

## 2019-12-19 DIAGNOSIS — Z87891 Personal history of nicotine dependence: Secondary | ICD-10-CM | POA: Diagnosis not present

## 2019-12-19 MED ORDER — AMOXICILLIN-POT CLAVULANATE 875-125 MG PO TABS: 1.0000 | ORAL_TABLET | Freq: Two times a day (BID) | ORAL | 0 refills | Status: DC

## 2019-12-19 MED ORDER — LIDOCAINE-EPINEPHRINE 2 %-1:100000 IJ SOLN
20.0000 mL | Freq: Once | INTRAMUSCULAR | Status: AC
  Administered 2019-12-19: 20 mL via INTRADERMAL
  Filled 2019-12-19: qty 1

## 2019-12-19 NOTE — Discharge Instructions (Signed)
Keep area clean by washing with soap and water daily. Do not submerge in water or scrub stitches Watch for signs of infection (redness, drainage, worsening pain) Take Augmentin (antibiotic) to prevent infection Take Tylenol or Ibuprofen for pain as needed Have stitches on the upper lip removed in 5-7 days

## 2019-12-19 NOTE — ED Provider Notes (Signed)
[facility] DEPT Provider Note   CSN: [id number] Arrival date & time: 12/19/19  1424     History Chief Complaint  Patient presents with  . Animal Bite  . lip injury    Heidi [name] is a 33 y.o. female with history of Hodgkin lymphoma currently undergoing chemo who presents with a dog bite. She states that she was at her boyfriend's dad's house and states that their family dog was there and she she leaned over the dog and it bit her on the lip. She has met the dog before and states it's a family pet but is unsure of it's rabies status. She looked in the mirror and saw her "meat" hanging out and so she decided to come to the ED. Nothing makes it better or worse. Her tetanus is UTD  HPI     Past Medical History:  Diagnosis Date  . ARDS (adult respiratory distress syndrome) ([place])   . Asthma   . Hodgkin lymphoma ([place])   . Hypertension     Patient Active Problem List   Diagnosis Date Noted  . Post herpetic neuralgia   . Hodgkin's lymphoma ([place]) 11/14/2018  . Herpes zoster without complication   . Counseling regarding advance care planning and goals of care 10/09/2018  . Hodgkin's disease in adult [facility]) 10/09/2018  . Port-A-Cath in place 09/28/2018  . Pruritus 01/20/2017  . Central line complication   . Deep vein thrombosis (DVT) of upper extremity ([place])   . SVC syndrome   . Dyspnea 12/14/2016  . Swelling 12/14/2016  . Anemia of chronic disease 11/26/2016  . CAP (community acquired pneumonia) 11/23/2016  . PNA (pneumonia) 11/23/2016  . Hyperpigmentation 06/04/2016  . Hypersensitivity reaction 05/04/2016  . Encounter for antineoplastic chemotherapy   . Hypokalemia 04/20/2016  . Volume overload 04/20/2016  . S/P bronchoscopy with biopsy   . 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 ([place])   . HCAP (healthcare-associated pneumonia) 04/10/2016  . Hyponatremia 04/10/2016  . Thrombocytosis ([place]) 04/10/2016  .  Pneumonitis 03/16/2016  . Postobstructive pneumonia 03/16/2016  . Acute respiratory failure with hypoxia ([place]) 03/16/2016  . Leukocytosis 03/16/2016  . Sepsis due to pneumonia ([place]) 03/16/2016  . Lung mass   . Lymphadenopathy     Past Surgical History:  Procedure Laterality Date  . AXILLARY LYMPH NODE BIOPSY Right 03/19/2016   Procedure: AXILLARY LYMPH NODE BIOPSY;  Surgeon: Armandina Gemma, MD;  [facility];  Service: General;  Laterality: Right;  . IR GENERIC HISTORICAL  12/22/2016   IR FLUORO GUIDE PORT INSERTION LEFT 12/22/2016 [facility]  . IR GENERIC HISTORICAL  12/22/2016   IR US GUIDE VASC ACCESS LEFT 12/22/2016 [facility]  . IR GENERIC HISTORICAL  12/22/2016   IR CV LINE INJECTION 12/22/2016 [facility]  . IR GENERIC HISTORICAL  12/22/2016   IR REMOVAL TUN ACCESS W/ PORT W/O FL MOD SED 12/22/2016 [facility]  . VIDEO BRONCHOSCOPY Bilateral 11/26/2016   Procedure: VIDEO BRONCHOSCOPY WITH FLUORO;  Surgeon: Rigoberto Noel, MD;  [facility];  Service: Cardiopulmonary;  Laterality: Bilateral;     OB History   No obstetric history on file.     No family history on file.  Social History   Tobacco Use  . Smoking status: Former Smoker    Packs/day: 0.50    Years: 15.00    Pack years: 7.50    Types: Cigarettes    Quit date: 03/27/2016    Years since quitting:  3.7  . Smokeless tobacco: Never Used  Substance Use Topics  . Alcohol use: Yes    Comment: occasional now  . Drug use: Not on file    Home Medications Prior to Admission medications   Medication Sig Start Date End Date Taking? Authorizing Provider  acyclovir (ZOVIRAX) 400 MG tablet Take 1 tablet (400 mg total) by mouth 2 (two) times daily. Start after completion of Valtrex 10/24/18   Brunetta Genera, MD  amitriptyline (ELAVIL) 10 MG tablet Take 1 tablet (10 mg total) by mouth at bedtime. 10/29/18   Lajean Saver, MD  gabapentin (NEURONTIN) 300 MG capsule Take 1 capsule (300 mg total) by  mouth 2 (two) times daily. 12/06/18   Brunetta Genera, MD  oxyCODONE-acetaminophen (PERCOCET/ROXICET) 5-325 MG tablet Take 1-2 tablets by mouth every 6 (six) hours as needed for severe pain. 01/02/19   Brunetta Genera, MD  prochlorperazine (COMPAZINE) 10 MG tablet Take 1 tablet (10 mg total) by mouth every 6 (six) hours as needed (Nausea or vomiting). 10/11/18 01/06/19  Brunetta Genera, MD    Allergies    Patient has no known allergies.  Review of Systems   Review of Systems  Skin: Positive for wound.  Allergic/Immunologic: Positive for immunocompromised state.  Hematological: Does not bruise/bleed easily.  All other systems reviewed and are negative.   Physical Exam Updated Vital Signs BP (!) 141/85 (BP Location: Right Arm)   Pulse 87   Temp 98.7 F (37.1 C) (Oral)   Resp 20   SpO2 99%   Physical Exam Vitals and nursing note reviewed.  Constitutional:      General: She is not in acute distress.    Appearance: Normal appearance. She is well-developed. She is not ill-appearing.  HENT:     Head: Normocephalic and atraumatic.     Mouth/Throat:     Comments: Somewhat macerated lower lip extending from the left lateral side to the inner portion.   Smaller ~1cm curvilinear laceration over the left lateral upper lip that crosses the [place] border Eyes:     General: No scleral icterus.       Right eye: No discharge.        Left eye: No discharge.     Conjunctiva/sclera: Conjunctivae normal.     Pupils: Pupils are equal, round, and reactive to light.  Cardiovascular:     Rate and Rhythm: Normal rate.  Pulmonary:     Effort: Pulmonary effort is normal. No respiratory distress.  Abdominal:     General: There is no distension.  Musculoskeletal:     Cervical back: Normal range of motion.  Skin:    General: Skin is warm and dry.  Neurological:     Mental Status: She is alert and oriented to person, place, and time.  Psychiatric:        Behavior: Behavior  normal.          ED Results / Procedures / Treatments   Labs (all labs ordered are listed, but only abnormal results are displayed) Labs Reviewed - No data to display  EKG None  Radiology No results found.  Procedures .[name] KitchenLaceration Repair  Date/Time: 12/19/2019 7:31 PM Performed by: Recardo Evangelist, PA-C Authorized by: Recardo Evangelist, PA-C   Consent:    Consent obtained:  Verbal   Consent given by:  Patient   Risks discussed:  Infection, pain and poor cosmetic result   Alternatives discussed:  No treatment Anesthesia (see MAR for exact dosages):  Anesthesia method:  Local infiltration   Local anesthetic:  Lidocaine 2% WITH epi Laceration details:    Location:  Lip   Lip location:  Lower exterior lip   Length (cm):  4   Depth (mm):  10 Repair type:    Repair type:  Intermediate Pre-procedure details:    Preparation:  Patient was prepped and draped in usual sterile fashion Exploration:    Wound exploration: wound explored through full range of motion and entire depth of wound probed and visualized   Treatment:    Area cleansed with:  Soap and water   Amount of cleaning:  Standard   Irrigation method:  Tap   Visualized foreign bodies/material removed: no   Skin repair:    Repair method:  Sutures   Suture size:  6-0   Wound skin closure material used: vicryl rapide.   Suture technique:  Simple interrupted   Number of sutures:  7 Approximation:    Approximation:  Close Post-procedure details:    Dressing:  Open (no dressing)   Patient tolerance of procedure:  Tolerated well, no immediate complications .[name] KitchenLaceration Repair  Date/Time: 12/19/2019 7:32 PM Performed by: Recardo Evangelist, PA-C Authorized by: Recardo Evangelist, PA-C   Consent:    Consent obtained:  Verbal   Consent given by:  Patient   Risks discussed:  Infection and pain   Alternatives discussed:  No treatment Anesthesia (see MAR for exact dosages):    Anesthesia method:   Local infiltration   Local anesthetic:  Lidocaine 2% WITH epi Laceration details:    Location:  Lip   Lip location:  Upper exterior lip   Length (cm):  1   Depth (mm):  2 Repair type:    Repair type:  Simple Pre-procedure details:    Preparation:  Patient was prepped and draped in usual sterile fashion Exploration:    Wound exploration: wound explored through full range of motion and entire depth of wound probed and visualized   Treatment:    Area cleansed with:  Soap and water   Amount of cleaning:  Standard   Irrigation method:  Tap   Visualized foreign bodies/material removed: no   Skin repair:    Repair method:  Sutures   Suture size:  5-0   Wound skin closure material used: Vicryl rapide.   Suture technique:  Simple interrupted   Number of sutures:  2 Approximation:    Approximation:  Close Post-procedure details:    Patient tolerance of procedure:  Tolerated well, no immediate complications   (including critical care time)    Medications Ordered in ED Medications  lidocaine-EPINEPHrine (XYLOCAINE W/EPI) 2 %-1:100000 (with pres) injection 20 mL (has no administration in time range)    ED Course  I have reviewed the triage vital signs and the nursing notes.  Pertinent labs & imaging results that were available during my care of the patient were reviewed by me and considered in my medical decision making (see chart for details).  32 year old female presents with dog bite wound to the face. She has an extensive laceration to the lower lip and simple one on the upper lip which does cross the [place] border. She reports being up to date on tetanus. She also reports that the dog was a family pet and is not concerned that he has rabies.   Absorbable stitches were used for the upper and lower lips. Wound was approximated as closely as possible although the lower lip was quite macerated. I  advised her to have the upper lip stitches removed in 5-7 days. She was given an rx  for Augmentin due to the dog bite and her immunocompromised status. Advised return if worsening  MDM Rules/Calculators/A&P   Final Clinical Impression(s) / ED Diagnoses Final diagnoses:  Dog bite of face, initial encounter  Lip laceration, initial encounter    Rx / DC Orders ED Discharge Orders    None       Recardo Evangelist, PA-C 12/19/19 Cruz Condon, MD 12/19/19 2138

## 2019-12-19 NOTE — ED Triage Notes (Signed)
Per [facility] pt was bite by dog that wasn't hers. Unsure if shots are up to date. Vitals: 156/98, 90HR, 18R, 99% on RA. 97.3 temp.

## 2019-12-27 ENCOUNTER — Other Ambulatory Visit: Payer: Self-pay | Admitting: Medical

## 2019-12-27 DIAGNOSIS — C8128 Mixed cellularity classical Hodgkin lymphoma, lymph nodes of multiple sites: Secondary | ICD-10-CM

## 2019-12-31 ENCOUNTER — Encounter (HOSPITAL_COMMUNITY): Payer: Medicaid Other

## 2019-12-31 ENCOUNTER — Telehealth: Payer: Self-pay | Admitting: *Deleted

## 2019-12-31 NOTE — Telephone Encounter (Signed)
12/27/2019: Heidi Mealy, PA completed p2p for PET. PEET denied, he obtained auth for stat CT scan  12/31/2119: Contacted by Heidi [name] in [employer]. Stat CT ordered 12/27/2019: Chest W/WO contrast and Abd&Pelvis W/WO contrast. Central Scheduling needs auth #. CB# [id number]. Contacted Heidi [name], rep took auth number per Heidi [name] message - "Approved for CT only: [id number], Good for 180 days" Patient to be contacted to schedule CTs ASAP. CT will call results to office before 5 and to Dr. Irene Limbo if after 5.

## 2020-01-02 ENCOUNTER — Telehealth: Payer: Self-pay | Admitting: Emergency Medicine

## 2020-01-02 NOTE — Telephone Encounter (Signed)
Spoke with radiology scheduling dept.  Confirmed that PA Lucianne Lei only wants CT abd pelvis w/contrast and CT chest w/contrast, and that the CT chest w/out contrast can be cancelled.

## 2020-01-03 ENCOUNTER — Ambulatory Visit (HOSPITAL_COMMUNITY): Payer: Medicaid Other

## 2020-01-07 ENCOUNTER — Inpatient Hospital Stay: Payer: Medicaid Other

## 2020-01-07 ENCOUNTER — Inpatient Hospital Stay: Payer: Medicaid Other | Admitting: Hematology

## 2020-01-07 ENCOUNTER — Telehealth: Payer: Self-pay | Admitting: Hematology

## 2020-01-07 ENCOUNTER — Other Ambulatory Visit: Payer: Self-pay | Admitting: *Deleted

## 2020-01-07 ENCOUNTER — Telehealth: Payer: Self-pay | Admitting: *Deleted

## 2020-01-07 NOTE — Telephone Encounter (Signed)
Encounter opened in error

## 2020-01-07 NOTE — Telephone Encounter (Signed)
R/s appt per 1/11 sch message - pt aware of appt date and time

## 2020-01-07 NOTE — Telephone Encounter (Signed)
Contacted patient when she missed appts for lab and MD on 1/11. Patient stated she had a lot going on and the appts just slipped her mind. Advised her that schedule message will be sent to r/s appts - she said she did not know when she could come. She states she has a CT scan on 1/13 that she is trying to r/s for later in week.  Per Dr. Irene Limbo - Please reschedule appts for labs and Keytruda missed today. Appt with MD can be during infusion. If she is not able to schedule appts within next 10 days, will just see her next cycle/next appt 2/1.  Schedule message sent

## 2020-01-08 ENCOUNTER — Other Ambulatory Visit (HOSPITAL_COMMUNITY): Payer: Medicaid Other

## 2020-01-09 ENCOUNTER — Telehealth: Payer: Self-pay | Admitting: Emergency Medicine

## 2020-01-09 ENCOUNTER — Ambulatory Visit (HOSPITAL_COMMUNITY): Admission: RE | Admit: 2020-01-09 | Payer: Medicaid Other | Source: Ambulatory Visit

## 2020-01-09 NOTE — Telephone Encounter (Signed)
Received call from [facility] in imaging scheduling, pt was no show for imaging appts this am.  RN Carlyon Prows for MD Kale's office made aware.

## 2020-01-10 NOTE — Progress Notes (Signed)
HEMATOLOGY ONCOLOGY PROGRESS NOTE  Date of service:   01/11/20     Patient Care Team: Patient, No Pcp Per as PCP - General (General Practice)  Chief complaint: Follow-up for Hodgkin's lymphoma  Diagnosis:   Refractory Mixed cellularity Hodgkin's lymphoma IVBE with extensive lymphadenopathy including right axillary, mediastinal and upper retroperitoneal and now biopsy proven pulmonary involvement. She was noted to have significant constitutional symptoms including significant weight loss, fevers chills and some night sweats.  Current Treatment:  Pembrolizumab  Previous treatment 5 cycles of AVD (without bleomycin due to lung involvement and DLCO of 37%, active smoker) Multiple avoidable treatment delays due to the patient's noncompliance with follow-up for avoidable reasons. She has been counseled repeatedly that this would increase the likelihood of unfavorable outcome.  2nd line therapy with Bendamustine + Brentuximab s/p 7 cycles.  3rd line therapy with ICE s/p 2 cycles   INTERVAL HISTORY: Heidi [name] is here for follow-up for her Hodgkins lymphoma for C15D1 Pembrolizumab treatment. The patient's last visit with [place] was on 12/17/2019. The pt reports that she is doing well overall.  The pt reports that she has been well and was scheduled for the CT C/A/P but missed her appointment due to confusion after several reschedules. She is planning on getting it done before our next visit.   Lab results today (01/11/20) of CBC w/diff and CMP is as follows: all values are WNL except for WBC at 3.3K, RBC at 3.45, Hgb at 11.7, HCT at 34.4, Lymphs Abs at 0.4K, Calcium at 8.7, AST at 60, ALT at 55. 01/11/2020 Sed Rate is in progress   On review of systems, pt denies skin rashes, diarrhea, SOB, mouth sores, fevers, chills, new lumps/bumps, problems at port site, abdominal pain, leg swelling and any other symptoms.    REVIEW OF SYSTEMS:   A 10+ POINT REVIEW OF SYSTEMS WAS OBTAINED including  neurology, dermatology, psychiatry, cardiac, respiratory, lymph, extremities, GI, GU, Musculoskeletal, constitutional, breasts, reproductive, HEENT.  All pertinent positives are noted in the HPI.  All others are negative.  . Past Medical History:  Diagnosis Date  . ARDS (adult respiratory distress syndrome) ([place])   . Asthma   . Hodgkin lymphoma ([place])   . Hypertension     . Past Surgical History:  Procedure Laterality Date  . AXILLARY LYMPH NODE BIOPSY Right 03/19/2016   Procedure: AXILLARY LYMPH NODE BIOPSY;  Surgeon: Armandina Gemma, MD;  Location: [facility];  Service: General;  Laterality: Right;  . IR GENERIC HISTORICAL  12/22/2016   IR FLUORO GUIDE PORT INSERTION LEFT 12/22/2016 [facility]  . IR GENERIC HISTORICAL  12/22/2016   IR US GUIDE VASC ACCESS LEFT 12/22/2016 [facility]  . IR GENERIC HISTORICAL  12/22/2016   IR CV LINE INJECTION 12/22/2016 [facility]  . IR GENERIC HISTORICAL  12/22/2016   IR REMOVAL TUN ACCESS W/ PORT W/O FL MOD SED 12/22/2016 [facility]  . VIDEO BRONCHOSCOPY Bilateral 11/26/2016   Procedure: VIDEO BRONCHOSCOPY WITH FLUORO;  Surgeon: Rigoberto Noel, MD;  Location: [facility];  Service: Cardiopulmonary;  Laterality: Bilateral;    . Social History   Tobacco Use  . Smoking status: Former Smoker    Packs/day: 0.50    Years: 15.00    Pack years: 7.50    Types: Cigarettes    Quit date: 03/27/2016    Years since quitting: 3.7  . Smokeless tobacco: Never Used  Substance Use Topics  . Alcohol use: Yes    Comment: occasional now  .  Drug use: Not on file    ALLERGIES:  has No Known Allergies.  MEDICATIONS:  Current Outpatient Medications  Medication Sig Dispense Refill  . acyclovir (ZOVIRAX) 400 MG tablet Take 1 tablet (400 mg total) by mouth 2 (two) times daily. Start after completion of Valtrex (Patient not taking: Reported on 01/11/2020) 60 tablet 6  . amitriptyline (ELAVIL) 10 MG tablet Take 1 tablet (10 mg total) by mouth at bedtime. (Patient  not taking: Reported on 01/11/2020) 20 tablet 0  . amoxicillin-clavulanate (AUGMENTIN) 875-125 MG tablet Take 1 tablet by mouth every 12 (twelve) hours. (Patient not taking: Reported on 01/11/2020) 14 tablet 0  . gabapentin (NEURONTIN) 300 MG capsule Take 1 capsule (300 mg total) by mouth 2 (two) times daily. (Patient not taking: Reported on 01/11/2020) 60 capsule 0  . oxyCODONE-acetaminophen (PERCOCET/ROXICET) 5-325 MG tablet Take 1-2 tablets by mouth every 6 (six) hours as needed for severe pain. (Patient not taking: Reported on 01/11/2020) 50 tablet 0   No current facility-administered medications for this visit.   Facility-Administered Medications Ordered in Other Visits  Medication Dose Route Frequency Provider Last Rate Last Admin  . sodium chloride flush (NS) 0.9 % injection 10 mL  10 mL Intracatheter PRN Brunetta Genera, MD      . sodium chloride flush (NS) 0.9 % injection 10 mL  10 mL Intracatheter PRN Brunetta Genera, MD   10 mL at 01/11/20 1205    PHYSICAL EXAMINATION: ECOG PERFORMANCE STATUS: 1 - Symptomatic but completely ambulatory  There were no vitals filed for this visit.   Exam was given in a chair   GENERAL:alert, in no acute distress and comfortable SKIN: no acute rashes, no significant lesions EYES: conjunctiva are pink and non-injected, sclera anicteric OROPHARYNX: MMM, no exudates, no oropharyngeal erythema or ulceration NECK: supple, no JVD LYMPH:  no palpable lymphadenopathy in the cervical, axillary or inguinal regions LUNGS: clear to auscultation b/l with normal respiratory effort HEART: regular rate & rhythm ABDOMEN:  normoactive bowel sounds , non tender, not distended. No palpable hepatosplenomegaly.  Extremity: no pedal edema PSYCH: alert & oriented x 3 with fluent speech NEURO: no focal motor/sensory deficits  LABORATORY DATA:   I have reviewed the data as listed  . CBC Latest Ref Rng & Units 01/11/2020 12/17/2019 11/26/2019  WBC 4.0 - 10.5  K/uL 3.3(L) 3.7(L) 3.4(L)  Hemoglobin 12.0 - 15.0 g/dL 11.7(L) 12.3 11.8(L)  Hematocrit 36.0 - 46.0 % 34.4(L) 36.6 34.7(L)  Platelets 150 - 400 K/uL 205 197 159   . CBC    Component Value Date/Time   WBC 3.3 (L) 01/11/2020 0937   RBC 3.45 (L) 01/11/2020 0937   HGB 11.7 (L) 01/11/2020 0937   HGB 10.6 (L) 11/07/2018 1047   HGB 11.7 07/20/2017 1315   HCT 34.4 (L) 01/11/2020 0937   HCT 35.2 07/20/2017 1315   PLT 205 01/11/2020 0937   PLT 473 (H) 11/07/2018 1047   PLT 266 07/20/2017 1315   MCV 99.7 01/11/2020 0937   MCV 93.2 07/20/2017 1315   MCH 33.9 01/11/2020 0937   MCHC 34.0 01/11/2020 0937   RDW 11.9 01/11/2020 0937   RDW 14.5 07/20/2017 1315   LYMPHSABS 0.4 (L) 01/11/2020 0937   LYMPHSABS 0.3 (L) 07/20/2017 1315   MONOABS 0.4 01/11/2020 0937   MONOABS 0.5 07/20/2017 1315   EOSABS 0.1 01/11/2020 0937   EOSABS 0.2 07/20/2017 1315   BASOSABS 0.0 01/11/2020 0937   BASOSABS 0.0 07/20/2017 1315   . CMP Latest Ref  Rng & Units 01/11/2020 12/17/2019 11/26/2019  Glucose 70 - 99 mg/dL 89 45(L) 71  BUN 6 - 20 mg/dL 9 9 13   Creatinine 0.44 - 1.00 mg/dL 0.88 0.84 0.91  Sodium 135 - 145 mmol/L 137 141 139  Potassium 3.5 - 5.1 mmol/L 4.0 4.4 4.6  Chloride 98 - 111 mmol/L 101 103 101  CO2 22 - 32 mmol/L 27 29 27   Calcium 8.9 - 10.3 mg/dL 8.7(L) 9.1 9.3  Total Protein 6.5 - 8.1 g/dL 7.0 7.0 7.2  Total Bilirubin 0.3 - 1.2 mg/dL 0.6 0.4 0.7  Alkaline Phos 38 - 126 U/L 71 57 74  AST 15 - 41 U/L 60(H) 22 63(H)  ALT 0 - 44 U/L 55(H) 18 41     RADIOGRAPHIC STUDIES: I have personally reviewed the radiological images as listed and agreed with the findings in the report. No results found.  ASSESSMENT & PLAN:   34 y.o. 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 She was noted to have  significant constitutional symptoms including significant weight loss, fevers chills and some night sweats. HIV negative Hepatitis C and hepatitis B serologies negative. Echo with normal ejection fraction. Patient was treated with 5 cycles of AVD (Bleomycin held due to poor DLCO 37% and ongoing smoking). Multiple avoidable treatment delays due to the patient's noncompliance with follow-up for avoidable reasons. She has been counseled repeatedly that this would increase the likelihood of unfavorable outcome.  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 S/p 6 cycles of 2nd line treatment with Bendamustine/Brentuximab And 1 cycle of Bretuximab alone  -PET/CT scan results from 04/14/2017 were discussed in details. She appears to have some persistent disease in her right lung at Deauville 5. It is difficult to say if this is recurrent or persistent disease since the patient failed to follow-up on multiple scheduled PET/CT scans in the early part and prior to her second line treatment.  Patient was lost to followup for >1 yr  09/26/18 PET/CT revealed Imaging findings compatible with recurrence of disease. 2. Multiple new large areas of hypermetabolic nodularity and airspace consolidation within both lungs which is presumed to represent pulmonary involvement by lymphoma. Deauville criteria 5. 3. New hypermetabolic left supraclavicular, left retroperitoneal, and bilateral pelvic lymph nodes. Deauville criteria 5. 4. Multifocal hypermetabolic osseous lesions. Deauville criteria 5. 5. Small volume of ascites, new.   12/06/18 PET/CT revealed Generally improved appearance with previously mostly Deauville 5 activity now mostly Deauville 4 activity. 2. Layering of much of the airspace opacity in the lungs, although considerable right perihilar airspace opacity remains. The remaining pulmonary opacities are assess this Deauville 5 on the right and over L4 on the left, and were  previously Deauville 5 bilaterally. 3. Mildly reduced size and moderately reduced activity in the abdominopelvic lymph nodes which are now dove L4. 4. The previously seen left supraclavicular lymph node seems to have completely resolved (Deauville 0). 5. The skeletal lesions remain at Deauville 4, although in absolute terms have decreased in SUV compared to previous. 6. No new regions of malignant involvement compared to prior exam. 7. Other imaging findings of potential clinical significance: Low-density blood pool suggests anemia. Pectus excavatum. Volume loss in the right hemithorax.  06/13/19 PET/CT revealed "No abnormal hypermetabolism (Deauville 1). 2. Mixed lytic and sclerotic osseous lesions appear more prominent than on 12/06/2018 but do not have associated hypermetabolism,  indicative of interval healing."  #2 s/p hypoxic respiratory failurewith dense right lung consolidation and left upper lobe consolidation with SVC syndrome. Patient has completed palliative radiation to the right lung mass causing SVC compression.  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  #4 Non compliancewith clinic and treatment followup. Missed 2nd dose of bendamustine with C2. Has missed multiple appointment for her PET/CT and missed appointment at [facility] for consideration of Transplant.  Pt was lost to follow up after July 2018 and returned on 08/31/18 Discussed the patient's goals of care and the pt noted that she is ready to begin treatment again and maintain compliance and follow ups   #5 h/o Grade 1 neuropathyfrom Brentuximab-Vedotin - resolved  #6 Shingles outbreak - curentlyresolved but with significant post herpetic neuralgia  #7 Significant anemia and thrombocytopenia after C1 of ICE -- will need close monitoring.  #8  DVT Prophylaxis - lovenox  #9 Abnormal LFTs ? Related to Pembrolizumab - stable today  PLAN: -Discussed pt labwork today, 01/11/20; blood counts are steady, blood chemistries are stable, liver enzymes are high  -Discussed 01/11/2020 Sed Rate is in progress -Elevated liver enzymes have been up and down - may be due to Pembrolizumab -No lab or clincal findings of progression of pt's Hodgkins lymphoma  -The pt has no prohibitive toxicities from continuing C15D1 Pembrolizumab at this time. -Continue Acyclovirfor shingles prophylaxis -Will monitor blood chemistries/LFTs -Will get CT C/A/P in 1-2 weeks (before next visit) -Will see back in 3 weeks with labs    FOLLOW UP: Patient to call radiology to schedule her CT chest abdomen pelvis in 1 to 2 weeks Please schedule next cycle of pembrolizumab in 3 weeks with labs and MD visit   The total time spent in the appt was 15 minutes and more than 50% was on counseling and direct patient cares.  All of the patient's questions were answered with apparent satisfaction. The patient knows to call the clinic with any problems, questions or concerns.    Sullivan Lone MD [place] AAHIVMS [facility] Hematology/Oncology Physician [facility]  (Office):       [phone] (Work cell):  [phone] (Fax):           [phone]  I, Heidi [name], am acting as a scribe for Dr. Sullivan Lone.   .I have reviewed the above documentation for accuracy and completeness, and I agree with the above. Brunetta Genera MD

## 2020-01-11 ENCOUNTER — Other Ambulatory Visit: Payer: Self-pay

## 2020-01-11 ENCOUNTER — Inpatient Hospital Stay: Payer: Medicaid Other

## 2020-01-11 ENCOUNTER — Inpatient Hospital Stay: Payer: Medicaid Other | Attending: Hematology

## 2020-01-11 ENCOUNTER — Telehealth: Payer: Self-pay | Admitting: Hematology

## 2020-01-11 ENCOUNTER — Inpatient Hospital Stay (HOSPITAL_BASED_OUTPATIENT_CLINIC_OR_DEPARTMENT_OTHER): Payer: Medicaid Other | Admitting: Hematology

## 2020-01-11 VITALS — BP 137/94 | HR 78 | Temp 98.9°F | Resp 20 | Ht 67.0 in | Wt 147.5 lb

## 2020-01-11 DIAGNOSIS — R6883 Chills (without fever): Secondary | ICD-10-CM | POA: Insufficient documentation

## 2020-01-11 DIAGNOSIS — Z79899 Other long term (current) drug therapy: Secondary | ICD-10-CM | POA: Diagnosis not present

## 2020-01-11 DIAGNOSIS — C8198 Hodgkin lymphoma, unspecified, lymph nodes of multiple sites: Secondary | ICD-10-CM

## 2020-01-11 DIAGNOSIS — Z9114 Patient's other noncompliance with medication regimen: Secondary | ICD-10-CM | POA: Diagnosis not present

## 2020-01-11 DIAGNOSIS — R61 Generalized hyperhidrosis: Secondary | ICD-10-CM | POA: Diagnosis not present

## 2020-01-11 DIAGNOSIS — D696 Thrombocytopenia, unspecified: Secondary | ICD-10-CM | POA: Insufficient documentation

## 2020-01-11 DIAGNOSIS — D649 Anemia, unspecified: Secondary | ICD-10-CM | POA: Insufficient documentation

## 2020-01-11 DIAGNOSIS — C8128 Mixed cellularity classical Hodgkin lymphoma, lymph nodes of multiple sites: Secondary | ICD-10-CM | POA: Diagnosis present

## 2020-01-11 DIAGNOSIS — R188 Other ascites: Secondary | ICD-10-CM | POA: Diagnosis not present

## 2020-01-11 DIAGNOSIS — R918 Other nonspecific abnormal finding of lung field: Secondary | ICD-10-CM | POA: Insufficient documentation

## 2020-01-11 DIAGNOSIS — R634 Abnormal weight loss: Secondary | ICD-10-CM | POA: Diagnosis not present

## 2020-01-11 DIAGNOSIS — R945 Abnormal results of liver function studies: Secondary | ICD-10-CM | POA: Diagnosis not present

## 2020-01-11 DIAGNOSIS — Q676 Pectus excavatum: Secondary | ICD-10-CM | POA: Diagnosis not present

## 2020-01-11 DIAGNOSIS — Z5112 Encounter for antineoplastic immunotherapy: Secondary | ICD-10-CM | POA: Insufficient documentation

## 2020-01-11 DIAGNOSIS — Z95828 Presence of other vascular implants and grafts: Secondary | ICD-10-CM

## 2020-01-11 DIAGNOSIS — Z9119 Patient's noncompliance with other medical treatment and regimen: Secondary | ICD-10-CM | POA: Diagnosis not present

## 2020-01-11 DIAGNOSIS — Z7189 Other specified counseling: Secondary | ICD-10-CM

## 2020-01-11 DIAGNOSIS — Z87891 Personal history of nicotine dependence: Secondary | ICD-10-CM | POA: Insufficient documentation

## 2020-01-11 LAB — CBC WITH DIFFERENTIAL/PLATELET
Abs Immature Granulocytes: 0.01 10*3/uL (ref 0.00–0.07)
Basophils Absolute: 0 10*3/uL (ref 0.0–0.1)
Basophils Relative: 0 %
Eosinophils Absolute: 0.1 10*3/uL (ref 0.0–0.5)
Eosinophils Relative: 2 %
HCT: 34.4 % — ABNORMAL LOW (ref 36.0–46.0)
Hemoglobin: 11.7 g/dL — ABNORMAL LOW (ref 12.0–15.0)
Immature Granulocytes: 0 %
Lymphocytes Relative: 11 %
Lymphs Abs: 0.4 10*3/uL — ABNORMAL LOW (ref 0.7–4.0)
MCH: 33.9 pg (ref 26.0–34.0)
MCHC: 34 g/dL (ref 30.0–36.0)
MCV: 99.7 fL (ref 80.0–100.0)
Monocytes Absolute: 0.4 10*3/uL (ref 0.1–1.0)
Monocytes Relative: 12 %
Neutro Abs: 2.4 10*3/uL (ref 1.7–7.7)
Neutrophils Relative %: 75 %
Platelets: 205 10*3/uL (ref 150–400)
RBC: 3.45 MIL/uL — ABNORMAL LOW (ref 3.87–5.11)
RDW: 11.9 % (ref 11.5–15.5)
WBC: 3.3 10*3/uL — ABNORMAL LOW (ref 4.0–10.5)
nRBC: 0 % (ref 0.0–0.2)

## 2020-01-11 LAB — CMP (CANCER CENTER ONLY)
ALT: 55 U/L — ABNORMAL HIGH (ref 0–44)
AST: 60 U/L — ABNORMAL HIGH (ref 15–41)
Albumin: 4.2 g/dL (ref 3.5–5.0)
Alkaline Phosphatase: 71 U/L (ref 38–126)
Anion gap: 9 (ref 5–15)
BUN: 9 mg/dL (ref 6–20)
CO2: 27 mmol/L (ref 22–32)
Calcium: 8.7 mg/dL — ABNORMAL LOW (ref 8.9–10.3)
Chloride: 101 mmol/L (ref 98–111)
Creatinine: 0.88 mg/dL (ref 0.44–1.00)
GFR, Est AFR Am: >60 mL/min (ref >60)
GFR, Estimated: >60 mL/min (ref >60)
Glucose, Bld: 89 mg/dL (ref 70–99)
Potassium: 4 mmol/L (ref 3.5–5.1)
Sodium: 137 mmol/L (ref 135–145)
Total Bilirubin: 0.6 mg/dL (ref 0.3–1.2)
Total Protein: 7 g/dL (ref 6.5–8.1)

## 2020-01-11 LAB — SEDIMENTATION RATE: Sed Rate: 28 mm/hr — ABNORMAL HIGH (ref 0–22)

## 2020-01-11 MED ORDER — DIPHENHYDRAMINE HCL 25 MG PO TABS
25.0000 mg | ORAL_TABLET | Freq: Once | ORAL | Status: AC
  Administered 2020-01-11: 25 mg via ORAL
  Filled 2020-01-11: qty 1

## 2020-01-11 MED ORDER — FAMOTIDINE 20 MG PO TABS
ORAL_TABLET | ORAL | Status: AC
  Filled 2020-01-11: qty 2

## 2020-01-11 MED ORDER — SODIUM CHLORIDE 0.9 % IV SOLN
200.0000 mg | Freq: Once | INTRAVENOUS | Status: AC
  Administered 2020-01-11: 200 mg via INTRAVENOUS
  Filled 2020-01-11: qty 8

## 2020-01-11 MED ORDER — SODIUM CHLORIDE 0.9% FLUSH
10.0000 mL | INTRAVENOUS | Status: DC | PRN
  Administered 2020-01-11: 10 mL
  Filled 2020-01-11: qty 10

## 2020-01-11 MED ORDER — ACETAMINOPHEN 325 MG PO TABS
ORAL_TABLET | ORAL | Status: AC
  Filled 2020-01-11: qty 2

## 2020-01-11 MED ORDER — HEPARIN SOD (PORK) LOCK FLUSH 100 UNIT/ML IV SOLN
500.0000 [IU] | Freq: Once | INTRAVENOUS | Status: AC | PRN
  Administered 2020-01-11: 500 [IU]
  Filled 2020-01-11: qty 5

## 2020-01-11 MED ORDER — DIPHENHYDRAMINE HCL 25 MG PO CAPS
ORAL_CAPSULE | ORAL | Status: AC
  Filled 2020-01-11: qty 1

## 2020-01-11 MED ORDER — ACETAMINOPHEN 325 MG PO TABS
650.0000 mg | ORAL_TABLET | Freq: Once | ORAL | Status: AC
  Administered 2020-01-11: 650 mg via ORAL

## 2020-01-11 MED ORDER — FAMOTIDINE 20 MG PO TABS
40.0000 mg | ORAL_TABLET | Freq: Once | ORAL | Status: AC
  Administered 2020-01-11: 40 mg via ORAL

## 2020-01-11 MED ORDER — SODIUM CHLORIDE 0.9 % IV SOLN
Freq: Once | INTRAVENOUS | Status: AC
  Filled 2020-01-11: qty 250

## 2020-01-11 NOTE — Patient Instructions (Signed)
[facility] Discharge Instructions for Patients Receiving Chemotherapy  Today you received the following Immunotherapy agent: Keytruda  To help prevent nausea and vomiting after your treatment, we encourage you to take your nausea medication as directed by your MD.   If you develop nausea and vomiting that is not controlled by your nausea medication, call the clinic.   BELOW ARE SYMPTOMS THAT SHOULD BE REPORTED IMMEDIATELY:  *FEVER GREATER THAN 100.5 F  *CHILLS WITH OR WITHOUT FEVER  NAUSEA AND VOMITING THAT IS NOT CONTROLLED WITH YOUR NAUSEA MEDICATION  *UNUSUAL SHORTNESS OF BREATH  *UNUSUAL BRUISING OR BLEEDING  TENDERNESS IN MOUTH AND THROAT WITH OR WITHOUT PRESENCE OF ULCERS  *URINARY PROBLEMS  *BOWEL PROBLEMS  UNUSUAL RASH Items with * indicate a potential emergency and should be followed up as soon as possible.  Feel free to call the clinic should you have any questions or concerns. The clinic phone number is [phone].  Please show the CHEMO ALERT CARD at check-in to the Emergency Department and triage nurse.  Coronavirus (COVID-19) Are you at risk?  Are you at risk for the Coronavirus (COVID-19)?  To be considered HIGH RISK for Coronavirus (COVID-19), you have to meet the following criteria:  . Traveled to [place], [place], [place], [place] or [place]; or in the [place] to [place], [place], [place], or [place]; and have fever, cough, and shortness of breath within the last 2 weeks of travel OR . Been in close contact with a person diagnosed with COVID-19 within the last 2 weeks and have fever, cough, and shortness of breath . IF YOU DO NOT MEET THESE CRITERIA, YOU ARE CONSIDERED LOW RISK FOR COVID-19.  What to do if you are HIGH RISK for COVID-19?  . If you are having a medical emergency, call 911. . Seek medical care right away. Before you go to a doctor's office, urgent care or emergency department, call ahead and tell them about  your recent travel, contact with someone diagnosed with COVID-19, and your symptoms. You should receive instructions from your physician's office regarding next steps of care.  . When you arrive at healthcare provider, tell the healthcare staff immediately you have returned from visiting [place], [place], [place], [place] or [place]; or traveled in the [place] to [place], [place], [place], or [place]; in the last two weeks or you have been in close contact with a person diagnosed with COVID-19 in the last 2 weeks.   . Tell the health care staff about your symptoms: fever, cough and shortness of breath. . After you have been seen by a medical provider, you will be either: o Tested for (COVID-19) and discharged home on quarantine except to seek medical care if symptoms worsen, and asked to  - Stay home and avoid contact with others until you get your results (4-5 days)  - Avoid travel on public transportation if possible (such as bus, train, or airplane) or o Sent to the Emergency Department by EMS for evaluation, COVID-19 testing, and possible admission depending on your condition and test results.  What to do if you are LOW RISK for COVID-19?  Reduce your risk of any infection by using the same precautions used for avoiding the common cold or flu:  . Wash your hands often with soap and warm water for at least 20 seconds.  If soap and water are not readily available, use an alcohol-based hand sanitizer with at least 60% alcohol.  . If   coughing or sneezing, cover your mouth and nose by coughing or sneezing into the elbow areas of your shirt or coat, into a tissue or into your sleeve (not your hands). . Avoid shaking hands with others and consider head nods or verbal greetings only. . Avoid touching your eyes, nose, or mouth with unwashed hands.  . Avoid close contact with people who are sick. . Avoid places or events with large numbers of people in one location, like concerts or sporting  events. . Carefully consider travel plans you have or are making. . If you are planning any travel outside or inside the [place], visit the CDC's Travelers' Health webpage for the latest health notices. . If you have some symptoms but not all symptoms, continue to monitor at home and seek medical attention if your symptoms worsen. . If you are having a medical emergency, call 911.   ADDITIONAL HEALTHCARE OPTIONS FOR PATIENTS  [facility] / e-Visit: [email or web address]         [facility]: [phone]  [facility]: [phone]                   [facility]: [phone]    

## 2020-01-11 NOTE — Telephone Encounter (Signed)
Scheduled appt per 1/15 los.  Tried calling patient phone and it said call could not be completed as dial.  Called the patient mother number and left a vm of the appt date and time.

## 2020-01-16 ENCOUNTER — Telehealth: Payer: Self-pay | Admitting: *Deleted

## 2020-01-16 NOTE — Telephone Encounter (Signed)
[place] scheduler notified Dr. Irene Limbo office that patient wanted to change infusion appt time on 2/5. Attempted to contact patient to clarify request and encourage to make appt for CT scans ordered in December prior to appt with Dr. Irene Limbo on 2/5.  Contacted patient at number [phone] "unable to complete call at this time". Contacted pt mother number and left voice mail for patient to call Dr. Irene Limbo office.

## 2020-01-28 ENCOUNTER — Other Ambulatory Visit: Payer: Medicaid Other

## 2020-01-28 ENCOUNTER — Ambulatory Visit: Payer: Medicaid Other

## 2020-01-28 ENCOUNTER — Ambulatory Visit: Payer: Medicaid Other | Admitting: Hematology

## 2020-01-31 ENCOUNTER — Ambulatory Visit (HOSPITAL_COMMUNITY)
Admission: RE | Admit: 2020-01-31 | Discharge: 2020-01-31 | Disposition: A | Discharge status: Home / Self Care | Payer: Medicaid Other | Source: Ambulatory Visit | Attending: Medical | Admitting: Medical

## 2020-01-31 ENCOUNTER — Other Ambulatory Visit: Payer: Self-pay

## 2020-01-31 DIAGNOSIS — C8128 Mixed cellularity classical Hodgkin lymphoma, lymph nodes of multiple sites: Secondary | ICD-10-CM | POA: Diagnosis present

## 2020-01-31 MED ORDER — IOHEXOL 300 MG/ML  SOLN
100.0000 mL | Freq: Once | INTRAMUSCULAR | Status: AC | PRN
  Administered 2020-01-31: 11:00:00 100 mL via INTRAVENOUS

## 2020-01-31 NOTE — Progress Notes (Signed)
These preliminary result these preliminary results were noted.  Awaiting final report.

## 2020-02-01 ENCOUNTER — Inpatient Hospital Stay: Payer: Medicaid Other | Attending: Hematology

## 2020-02-01 ENCOUNTER — Other Ambulatory Visit: Payer: Self-pay

## 2020-02-01 ENCOUNTER — Inpatient Hospital Stay: Payer: Medicaid Other

## 2020-02-01 ENCOUNTER — Inpatient Hospital Stay (HOSPITAL_BASED_OUTPATIENT_CLINIC_OR_DEPARTMENT_OTHER): Payer: Medicaid Other | Admitting: Hematology

## 2020-02-01 VITALS — BP 139/92 | HR 88 | Temp 97.9°F | Resp 18 | Ht 67.0 in | Wt 151.8 lb

## 2020-02-01 DIAGNOSIS — Z79899 Other long term (current) drug therapy: Secondary | ICD-10-CM | POA: Diagnosis not present

## 2020-02-01 DIAGNOSIS — C8198 Hodgkin lymphoma, unspecified, lymph nodes of multiple sites: Secondary | ICD-10-CM | POA: Diagnosis not present

## 2020-02-01 DIAGNOSIS — Z5112 Encounter for antineoplastic immunotherapy: Secondary | ICD-10-CM | POA: Diagnosis not present

## 2020-02-01 DIAGNOSIS — G629 Polyneuropathy, unspecified: Secondary | ICD-10-CM | POA: Insufficient documentation

## 2020-02-01 DIAGNOSIS — Z95828 Presence of other vascular implants and grafts: Secondary | ICD-10-CM

## 2020-02-01 DIAGNOSIS — C8128 Mixed cellularity classical Hodgkin lymphoma, lymph nodes of multiple sites: Secondary | ICD-10-CM | POA: Diagnosis present

## 2020-02-01 DIAGNOSIS — Z7189 Other specified counseling: Secondary | ICD-10-CM

## 2020-02-01 DIAGNOSIS — B0229 Other postherpetic nervous system involvement: Secondary | ICD-10-CM | POA: Insufficient documentation

## 2020-02-01 LAB — CMP (CANCER CENTER ONLY)
ALT: 24 U/L (ref 0–44)
AST: 27 U/L (ref 15–41)
Albumin: 4.1 g/dL (ref 3.5–5.0)
Alkaline Phosphatase: 69 U/L (ref 38–126)
Anion gap: 11 (ref 5–15)
BUN: 16 mg/dL (ref 6–20)
CO2: 25 mmol/L (ref 22–32)
Calcium: 8.9 mg/dL (ref 8.9–10.3)
Chloride: 103 mmol/L (ref 98–111)
Creatinine: 1 mg/dL (ref 0.44–1.00)
GFR, Est AFR Am: >60 mL/min (ref >60)
GFR, Estimated: >60 mL/min (ref >60)
Glucose, Bld: 99 mg/dL (ref 70–99)
Potassium: 4.3 mmol/L (ref 3.5–5.1)
Sodium: 139 mmol/L (ref 135–145)
Total Bilirubin: 0.3 mg/dL (ref 0.3–1.2)
Total Protein: 7 g/dL (ref 6.5–8.1)

## 2020-02-01 LAB — CBC WITH DIFFERENTIAL/PLATELET
Abs Immature Granulocytes: 0.01 10*3/uL (ref 0.00–0.07)
Basophils Absolute: 0 10*3/uL (ref 0.0–0.1)
Basophils Relative: 0 %
Eosinophils Absolute: 0.1 10*3/uL (ref 0.0–0.5)
Eosinophils Relative: 3 %
HCT: 35.1 % — ABNORMAL LOW (ref 36.0–46.0)
Hemoglobin: 12 g/dL (ref 12.0–15.0)
Immature Granulocytes: 0 %
Lymphocytes Relative: 9 %
Lymphs Abs: 0.4 10*3/uL — ABNORMAL LOW (ref 0.7–4.0)
MCH: 34 pg (ref 26.0–34.0)
MCHC: 34.2 g/dL (ref 30.0–36.0)
MCV: 99.4 fL (ref 80.0–100.0)
Monocytes Absolute: 0.6 10*3/uL (ref 0.1–1.0)
Monocytes Relative: 15 %
Neutro Abs: 2.8 10*3/uL (ref 1.7–7.7)
Neutrophils Relative %: 73 %
Platelets: 204 10*3/uL (ref 150–400)
RBC: 3.53 MIL/uL — ABNORMAL LOW (ref 3.87–5.11)
RDW: 11.9 % (ref 11.5–15.5)
WBC: 3.9 10*3/uL — ABNORMAL LOW (ref 4.0–10.5)
nRBC: 0 % (ref 0.0–0.2)

## 2020-02-01 LAB — SEDIMENTATION RATE: Sed Rate: 5 mm/hr (ref 0–22)

## 2020-02-01 MED ORDER — SODIUM CHLORIDE 0.9 % IV SOLN
200.0000 mg | Freq: Once | INTRAVENOUS | Status: AC
  Administered 2020-02-01: 11:00:00 200 mg via INTRAVENOUS
  Filled 2020-02-01: qty 8

## 2020-02-01 MED ORDER — ACETAMINOPHEN 325 MG PO TABS
650.0000 mg | ORAL_TABLET | Freq: Once | ORAL | Status: AC
  Administered 2020-02-01: 650 mg via ORAL

## 2020-02-01 MED ORDER — FAMOTIDINE 20 MG PO TABS
40.0000 mg | ORAL_TABLET | Freq: Once | ORAL | Status: AC
  Administered 2020-02-01: 40 mg via ORAL

## 2020-02-01 MED ORDER — DIPHENHYDRAMINE HCL 25 MG PO CAPS
25.0000 mg | ORAL_CAPSULE | Freq: Once | ORAL | Status: AC
  Administered 2020-02-01: 25 mg via ORAL

## 2020-02-01 MED ORDER — SODIUM CHLORIDE 0.9% FLUSH
10.0000 mL | INTRAVENOUS | Status: DC | PRN
  Administered 2020-02-01: 12:00:00 10 mL
  Filled 2020-02-01: qty 10

## 2020-02-01 MED ORDER — ACETAMINOPHEN 325 MG PO TABS
ORAL_TABLET | ORAL | Status: AC
  Filled 2020-02-01: qty 2

## 2020-02-01 MED ORDER — DIPHENHYDRAMINE HCL 25 MG PO CAPS
ORAL_CAPSULE | ORAL | Status: AC
  Filled 2020-02-01: qty 1

## 2020-02-01 MED ORDER — HEPARIN SOD (PORK) LOCK FLUSH 100 UNIT/ML IV SOLN
500.0000 [IU] | Freq: Once | INTRAVENOUS | Status: AC | PRN
  Administered 2020-02-01: 500 [IU]
  Filled 2020-02-01: qty 5

## 2020-02-01 MED ORDER — SODIUM CHLORIDE 0.9% FLUSH
10.0000 mL | INTRAVENOUS | Status: DC | PRN
  Administered 2020-02-01: 10 mL
  Filled 2020-02-01: qty 10

## 2020-02-01 MED ORDER — SODIUM CHLORIDE 0.9 % IV SOLN
Freq: Once | INTRAVENOUS | Status: AC
  Filled 2020-02-01: qty 250

## 2020-02-01 MED ORDER — FAMOTIDINE 20 MG PO TABS
ORAL_TABLET | ORAL | Status: AC
  Filled 2020-02-01: qty 2

## 2020-02-01 NOTE — Progress Notes (Signed)
HEMATOLOGY ONCOLOGY PROGRESS NOTE  Date of service:   02/01/20     Patient Care Team: Patient, No Pcp Per as PCP - General (General Practice)  Chief complaint: Follow-up for Hodgkin's lymphoma  Diagnosis:   Refractory Mixed cellularity Hodgkin's lymphoma IVBE with extensive lymphadenopathy including right axillary, mediastinal and upper retroperitoneal and now biopsy proven pulmonary involvement. She was noted to have significant constitutional symptoms including significant weight loss, fevers chills and some night sweats.  Current Treatment:  Pembrolizumab  Previous treatment  5 cycles of AVD (without bleomycin due to lung involvement and DLCO of 37%, active smoker) Multiple avoidable treatment delays due to the patient's noncompliance with follow-up for avoidable reasons. She has been counseled repeatedly that this would increase the likelihood of unfavorable outcome.  2nd line therapy with Bendamustine + Brentuximab s/p 7 cycles.  3rd line therapy with ICE s/p 2 cycles   INTERVAL HISTORY:  Heidi [name] is here for follow-up for her Hodgkins lymphoma for C15D1 Pembrolizumab treatment. The patient's last visit with [place] was on 01/11/2020. The pt reports that she is doing well overall.  The pt reports she has no new concerns  She is taking vitamin D 2000 units a day.  Of note since the patient's last visit, pt has had CT ABDOMEN PELVIS W CONTRAST (Accession [id number]) and Outlook (Accession [id number]) completed on 01/31/2020 with results revealing "1. Small pre pericardial lymph node, not present on the prior study. 2. Stable volume loss in the right chest with shift of mediastinal structures into the right chest. 3. No change in the appearance of multifocal bony sclerosis, without signs of FDG uptake on the previous exam, attention on follow-up."  Lab results today (02/01/20) of CBC w/diff and CMP is as follows: all values are WNL except for WBC at 3.9, RBC at  3.53, HCT at 35.1, Lymphs Abs at 0.4.  On review of systems, pt reports no new concerns and denies diarrhea, headaches, new body pains, nausea, fevers, chills, rashes, night sweats, new lumps or bumps, shortness of breath, abdominal pain, leg swelling and any other symptoms.   REVIEW OF SYSTEMS:   A 10+ POINT REVIEW OF SYSTEMS WAS OBTAINED including neurology, dermatology, psychiatry, cardiac, respiratory, lymph, extremities, GI, GU, Musculoskeletal, constitutional, breasts, reproductive, HEENT.  All pertinent positives are noted in the HPI.  All others are negative.   . Past Medical History:  Diagnosis Date  . ARDS (adult respiratory distress syndrome) ([place])   . Asthma   . Hodgkin lymphoma ([place])   . Hypertension     . Past Surgical History:  Procedure Laterality Date  . AXILLARY LYMPH NODE BIOPSY Right 03/19/2016   Procedure: AXILLARY LYMPH NODE BIOPSY;  Surgeon: Armandina Gemma, MD;  Location: [facility];  Service: General;  Laterality: Right;  . IR GENERIC HISTORICAL  12/22/2016   IR FLUORO GUIDE PORT INSERTION LEFT 12/22/2016 [facility]  . IR GENERIC HISTORICAL  12/22/2016   IR US GUIDE VASC ACCESS LEFT 12/22/2016 [facility]  . IR GENERIC HISTORICAL  12/22/2016   IR CV LINE INJECTION 12/22/2016 [facility]  . IR GENERIC HISTORICAL  12/22/2016   IR REMOVAL TUN ACCESS W/ PORT W/O FL MOD SED 12/22/2016 [facility]  . VIDEO BRONCHOSCOPY Bilateral 11/26/2016   Procedure: VIDEO BRONCHOSCOPY WITH FLUORO;  Surgeon: Rigoberto Noel, MD;  Location: [facility];  Service: Cardiopulmonary;  Laterality: Bilateral;    . Social History   Tobacco Use  . Smoking status: Former Smoker  Packs/day: 0.50    Years: 15.00    Pack years: 7.50    Types: Cigarettes    Quit date: 03/27/2016    Years since quitting: 3.8  . Smokeless tobacco: Never Used  Substance Use Topics  . Alcohol use: Yes    Comment: occasional now  . Drug use: Not on file    ALLERGIES:  has No Known  Allergies.  MEDICATIONS:  Current Outpatient Medications  Medication Sig Dispense Refill  . acyclovir (ZOVIRAX) 400 MG tablet Take 1 tablet (400 mg total) by mouth 2 (two) times daily. Start after completion of Valtrex (Patient not taking: Reported on 01/11/2020) 60 tablet 6  . amitriptyline (ELAVIL) 10 MG tablet Take 1 tablet (10 mg total) by mouth at bedtime. (Patient not taking: Reported on 01/11/2020) 20 tablet 0  . amoxicillin-clavulanate (AUGMENTIN) 875-125 MG tablet Take 1 tablet by mouth every 12 (twelve) hours. (Patient not taking: Reported on 01/11/2020) 14 tablet 0  . gabapentin (NEURONTIN) 300 MG capsule Take 1 capsule (300 mg total) by mouth 2 (two) times daily. (Patient not taking: Reported on 01/11/2020) 60 capsule 0  . oxyCODONE-acetaminophen (PERCOCET/ROXICET) 5-325 MG tablet Take 1-2 tablets by mouth every 6 (six) hours as needed for severe pain. (Patient not taking: Reported on 01/11/2020) 50 tablet 0   No current facility-administered medications for this visit.   Facility-Administered Medications Ordered in Other Visits  Medication Dose Route Frequency Provider Last Rate Last Admin  . sodium chloride flush (NS) 0.9 % injection 10 mL  10 mL Intracatheter PRN Brunetta Genera, MD      . sodium chloride flush (NS) 0.9 % injection 10 mL  10 mL Intracatheter PRN Brunetta Genera, MD   10 mL at 02/01/20 [id number]    PHYSICAL EXAMINATION: ECOG FS:1 - Symptomatic but completely ambulatory  Vitals:   02/01/20 0957  BP: (!) 139/92  Pulse: 88  Resp: 18  Temp: 97.9 F (36.6 C)  SpO2: 100%   Wt Readings from Last 3 Encounters:  02/01/20 151 lb 12.8 oz (68.9 kg)  01/11/20 147 lb 8 oz (66.9 kg)  12/17/19 150 lb 4.8 oz (68.2 kg)   Body mass index is 23.78 kg/m.    GENERAL:alert, in no acute distress and comfortable SKIN: no acute rashes, no significant lesions EYES: conjunctiva are pink and non-injected, sclera anicteric OROPHARYNX: MMM, no exudates, no oropharyngeal  erythema or ulceration NECK: supple, no JVD LYMPH:  no palpable lymphadenopathy in the cervical, axillary or inguinal regions LUNGS: clear to auscultation b/l with normal respiratory effort HEART: regular rate & rhythm ABDOMEN:  normoactive bowel sounds , non tender, not distended. Extremity: no pedal edema PSYCH: alert & oriented x 3 with fluent speech NEURO: no focal motor/sensory deficits  LABORATORY DATA:   I have reviewed the data as listed  . CBC Latest Ref Rng & Units 02/01/2020 01/11/2020 12/17/2019  WBC 4.0 - 10.5 K/uL 3.9(L) 3.3(L) 3.7(L)  Hemoglobin 12.0 - 15.0 g/dL 12.0 11.7(L) 12.3  Hematocrit 36.0 - 46.0 % 35.1(L) 34.4(L) 36.6  Platelets 150 - 400 K/uL 204 205 197   . CBC    Component Value Date/Time   WBC 3.9 (L) 02/01/2020 0837   RBC 3.53 (L) 02/01/2020 0837   HGB 12.0 02/01/2020 0837   HGB 10.6 (L) 11/07/2018 1047   HGB 11.7 07/20/2017 1315   HCT 35.1 (L) 02/01/2020 0837   HCT 35.2 07/20/2017 1315   PLT 204 02/01/2020 0837   PLT 473 (H) 11/07/2018 1047  PLT 266 07/20/2017 1315   MCV 99.4 02/01/2020 0837   MCV 93.2 07/20/2017 1315   MCH 34.0 02/01/2020 0837   MCHC 34.2 02/01/2020 0837   RDW 11.9 02/01/2020 0837   RDW 14.5 07/20/2017 1315   LYMPHSABS 0.4 (L) 02/01/2020 0837   LYMPHSABS 0.3 (L) 07/20/2017 1315   MONOABS 0.6 02/01/2020 0837   MONOABS 0.5 07/20/2017 1315   EOSABS 0.1 02/01/2020 0837   EOSABS 0.2 07/20/2017 1315   BASOSABS 0.0 02/01/2020 0837   BASOSABS 0.0 07/20/2017 1315   . CMP Latest Ref Rng & Units 02/01/2020 01/11/2020 12/17/2019  Glucose 70 - 99 mg/dL 99 89 45(L)  BUN 6 - 20 mg/dL 16 9 9   Creatinine 0.44 - 1.00 mg/dL 1.00 0.88 0.84  Sodium 135 - 145 mmol/L 139 137 141  Potassium 3.5 - 5.1 mmol/L 4.3 4.0 4.4  Chloride 98 - 111 mmol/L 103 101 103  CO2 22 - 32 mmol/L 25 27 29   Calcium 8.9 - 10.3 mg/dL 8.9 8.7(L) 9.1  Total Protein 6.5 - 8.1 g/dL 7.0 7.0 7.0  Total Bilirubin 0.3 - 1.2 mg/dL 0.3 0.6 0.4  Alkaline Phos 38 - 126 U/L  69 71 57  AST 15 - 41 U/L 27 60(H) 22  ALT 0 - 44 U/L 24 55(H) 18   02/01/2020     RADIOGRAPHIC STUDIES: I have personally reviewed the radiological images as listed and agreed with the findings in the report. CT CHEST W CONTRAST  Result Date: 01/31/2020 CLINICAL DATA:  History of mixed cellularity Hodgkin's lymphoma with extensive adenopathy, now with biopsy-proven pulmonary involvement. EXAM: CT CHEST, ABDOMEN, AND PELVIS WITH CONTRAST TECHNIQUE: Multidetector CT imaging of the chest, abdomen and pelvis was performed following the standard protocol during bolus administration of intravenous contrast. CONTRAST:  <See Chart> OMNIPAQUE IOHEXOL 300 MG/ML  SOLN COMPARISON:  PET exam dated 06/13/2019 FINDINGS: CT CHEST FINDINGS Cardiovascular: Left-sided Port-A-Cath terminates at the caval to atrial junction. Shift of mediastinal structures into the right chest similar to prior study. Central pulmonary arteries are engorged at 2.5 cm greatest caliber. Narrowing of the SVC similar to previous exams. Collateral formation at the upper chest in this patient with history of SVC like syndrome/symptoms. No pericardial effusion. Mediastinum/Nodes: No signs of mediastinal lymphadenopathy. No signs of axillary lymphadenopathy. No thoracic inlet adenopathy. Esophagus grossly normal. Thoracic inlet structures are unremarkable. Lungs/Pleura: Volume loss in the right chest is with shift of mediastinal structures as before. Signs of bronchiectasis in the right upper lobe along with fibrotic changes are unchanged. No signs of pleural effusion. No new area of consolidation or suspicious nodule. Subtle ground-glass attenuation in the left chest in the lingula and upper lobe similar to the prior exam. Musculoskeletal: No signs of chest wall mass. CT ABDOMEN PELVIS FINDINGS Hepatobiliary: No focal suspicious hepatic lesion. Focal fat along the fissure for false form ligament. No signs of biliary ductal dilation or  pericholecystic inflammation. Pancreas: Pancreas is normal without signs of ductal dilation or peripancreatic inflammation. Spleen: Spleen is normal. Adrenals/Urinary Tract: Adrenal glands are normal size and configuration. No sign of hydronephrosis. Symmetric renal enhancement. Urinary bladder is normal. Stomach/Bowel: No sign of acute gastrointestinal process. Enteric contrast passes into the colon largely contained within the colon on today's exam. Vascular/Lymphatic: Patent abdominal vasculature. No signs of aneurysm in the abdomen. No signs of adenopathy in the retroperitoneum or in the upper abdomen. No signs of pelvic lymphadenopathy. Reproductive: Tampon in the vagina. Uterus and adnexa unremarkable by CT. Other: No abdominal  wall hernia or abnormality. No abdominopelvic ascites. Musculoskeletal: Numerous of sclerotic change mainly in the lower thoracic and lumbar spine. Signs of sclerosis throughout the bilateral iliac bones, pubic bones and ischial bones. No signs of destructive bone lesion. No acute bone process. IMPRESSION: 1. Small pre pericardial lymph node, not present on the prior study. 2. Stable volume loss in the right chest with shift of mediastinal structures into the right chest. 3. No change in the appearance of multifocal bony sclerosis, without signs of FDG uptake on the previous exam, attention on follow-up. Electronically Signed   By: Zetta Bills M.D.   On: 01/31/2020 11:20   CT ABDOMEN PELVIS W CONTRAST  Result Date: 01/31/2020 CLINICAL DATA:  History of mixed cellularity Hodgkin's lymphoma with extensive adenopathy, now with biopsy-proven pulmonary involvement. EXAM: CT CHEST, ABDOMEN, AND PELVIS WITH CONTRAST TECHNIQUE: Multidetector CT imaging of the chest, abdomen and pelvis was performed following the standard protocol during bolus administration of intravenous contrast. CONTRAST:  <See Chart> OMNIPAQUE IOHEXOL 300 MG/ML  SOLN COMPARISON:  PET exam dated 06/13/2019 FINDINGS: CT  CHEST FINDINGS Cardiovascular: Left-sided Port-A-Cath terminates at the caval to atrial junction. Shift of mediastinal structures into the right chest similar to prior study. Central pulmonary arteries are engorged at 2.5 cm greatest caliber. Narrowing of the SVC similar to previous exams. Collateral formation at the upper chest in this patient with history of SVC like syndrome/symptoms. No pericardial effusion. Mediastinum/Nodes: No signs of mediastinal lymphadenopathy. No signs of axillary lymphadenopathy. No thoracic inlet adenopathy. Esophagus grossly normal. Thoracic inlet structures are unremarkable. Lungs/Pleura: Volume loss in the right chest is with shift of mediastinal structures as before. Signs of bronchiectasis in the right upper lobe along with fibrotic changes are unchanged. No signs of pleural effusion. No new area of consolidation or suspicious nodule. Subtle ground-glass attenuation in the left chest in the lingula and upper lobe similar to the prior exam. Musculoskeletal: No signs of chest wall mass. CT ABDOMEN PELVIS FINDINGS Hepatobiliary: No focal suspicious hepatic lesion. Focal fat along the fissure for false form ligament. No signs of biliary ductal dilation or pericholecystic inflammation. Pancreas: Pancreas is normal without signs of ductal dilation or peripancreatic inflammation. Spleen: Spleen is normal. Adrenals/Urinary Tract: Adrenal glands are normal size and configuration. No sign of hydronephrosis. Symmetric renal enhancement. Urinary bladder is normal. Stomach/Bowel: No sign of acute gastrointestinal process. Enteric contrast passes into the colon largely contained within the colon on today's exam. Vascular/Lymphatic: Patent abdominal vasculature. No signs of aneurysm in the abdomen. No signs of adenopathy in the retroperitoneum or in the upper abdomen. No signs of pelvic lymphadenopathy. Reproductive: Tampon in the vagina. Uterus and adnexa unremarkable by CT. Other: No abdominal  wall hernia or abnormality. No abdominopelvic ascites. Musculoskeletal: Numerous of sclerotic change mainly in the lower thoracic and lumbar spine. Signs of sclerosis throughout the bilateral iliac bones, pubic bones and ischial bones. No signs of destructive bone lesion. No acute bone process. IMPRESSION: 1. Small pre pericardial lymph node, not present on the prior study. 2. Stable volume loss in the right chest with shift of mediastinal structures into the right chest. 3. No change in the appearance of multifocal bony sclerosis, without signs of FDG uptake on the previous exam, attention on follow-up. Electronically Signed   By: Zetta Bills M.D.   On: 01/31/2020 11:20    ASSESSMENT & PLAN:   34 y.o. African-American female with  #1 Refractory/Progressive Mixed cellularity Hodgkin's lymphoma IV BEwith extensive lymphadenopathy including right axillary,  mediastinal and upper retroperitoneal and now biopsy proven pulmonary involvement. She was noted to have significant constitutional symptoms including significant weight loss, fevers chills and some night sweats. HIV negative Hepatitis C and hepatitis B serologies negative. Echo with normal ejection fraction. Patient was treated with 5 cycles of AVD (Bleomycin held due to poor DLCO 37% and ongoing smoking). Multiple avoidable treatment delays due to the patient's noncompliance with follow-up for avoidable reasons. She has been counseled repeatedly that this would increase the likelihood of unfavorable outcome.  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 S/p 6 cycles of 2nd line treatment with Bendamustine/Brentuximab And 1 cycle of Bretuximab alone  -PET/CT scan results from 04/14/2017 were discussed in details. She appears to have some persistent disease in her right lung at Deauville 5. It is difficult to say if this is recurrent or persistent disease since the patient failed to follow-up on multiple scheduled  PET/CT scans in the early part and prior to her second line treatment.  Patient was lost to followup for >1 yr  09/26/18 PET/CT revealed Imaging findings compatible with recurrence of disease. 2. Multiple new large areas of hypermetabolic nodularity and airspace consolidation within both lungs which is presumed to represent pulmonary involvement by lymphoma. Deauville criteria 5. 3. New hypermetabolic left supraclavicular, left retroperitoneal, and bilateral pelvic lymph nodes. Deauville criteria 5. 4. Multifocal hypermetabolic osseous lesions. Deauville criteria 5. 5. Small volume of ascites, new.   12/06/18 PET/CT revealed Generally improved appearance with previously mostly Deauville 5 activity now mostly Deauville 4 activity. 2. Layering of much of the airspace opacity in the lungs, although considerable right perihilar airspace opacity remains. The remaining pulmonary opacities are assess this Deauville 5 on the right and over L4 on the left, and were previously Deauville 5 bilaterally. 3. Mildly reduced size and moderately reduced activity in the abdominopelvic lymph nodes which are now dove L4. 4. The previously seen left supraclavicular lymph node seems to have completely resolved (Deauville 0). 5. The skeletal lesions remain at Deauville 4, although in absolute terms have decreased in SUV compared to previous. 6. No new regions of malignant involvement compared to prior exam. 7. Other imaging findings of potential clinical significance: Low-density blood pool suggests anemia. Pectus excavatum. Volume loss in the right hemithorax.  06/13/19 PET/CT revealed "No abnormal hypermetabolism (Deauville 1). 2. Mixed lytic and sclerotic osseous lesions appear more prominent than on 12/06/2018 but do not have associated hypermetabolism, indicative of interval healing."  #2 s/p hypoxic respiratory failurewith dense right lung consolidation and left upper lobe consolidation with SVC syndrome. Patient has  completed palliative radiation to the right lung mass causing SVC compression.  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  #4 Non compliancewith clinic and treatment followup. Missed 2nd dose of bendamustine with C2. Has missed multiple appointment for her PET/CT and missed appointment at [facility] for consideration of Transplant.  Pt was lost to follow up after July 2018 and returned on 08/31/18 Discussed the patient's goals of care and the pt noted that she is ready to begin treatment again and maintain compliance and follow ups   #5 h/o Grade 1 neuropathyfrom Brentuximab-Vedotin - resolved  #6 Shingles outbreak - curentlyresolved but with significant post herpetic neuralgia  #7 Significant anemia and thrombocytopenia after C1 of ICE -- will need close monitoring.  #  8 DVT Prophylaxis - lovenox  #9 Abnormal LFTs ? Related to Pembrolizumab - stable today  PLAN: -Discussed pt labwork today, 02/01/20;  all values are WNL except for WBC at 3.9, RBC at 3.53, HCT at 35.1, Lymphs Abs at 0.4. -Discussed the option for transplant and explained that current treatment is not curative. She has denied referral to transplant center. -Discussed that she is still in remission  -Discussed CT ABDOMEN PELVIS W CONTRAST (Accession [id number]) and [place] (Accession [id number]) completed on 01/31/2020 with results revealing "1. Small pre pericardial lymph node, not present on the prior study. 2. Stable volume loss in the right chest with shift of mediastinal structures into the right chest. 3. No change in the appearance of multifocal bony sclerosis, without signs of FDG uptake on the previous exam, attention on follow-up." -no overt lab, clinical or radiographic evidence of CHL progression at this time.   FOLLOW  UP: Please schedule next 4 cycles of pembrolizumab every 3 weeks with labs. MD visit every other treatment.  The total time spent in the appt was 30 minutes and more than 50% was on counseling and direct patient cares.  All of the patient's questions were answered with apparent satisfaction. The patient knows to call the clinic with any problems, questions or concerns.     Sullivan Lone MD Heidi AAHIVMS [facility] Hematology/Oncology Physician [facility]  (Office):       [phone] (Work cell):  [phone] (Fax):           [phone]  I, Scot Dock, am acting as a scribe for Dr. Sullivan Lone.   .I have reviewed the above documentation for accuracy and completeness, and I agree with the above. Brunetta Genera MD

## 2020-02-01 NOTE — Patient Instructions (Signed)
[facility] Discharge Instructions for Patients Receiving Chemotherapy  Today you received the following Immunotherapy agent: Pembolizumab  To help prevent nausea and vomiting after your treatment, we encourage you to take your nausea medication as directed by your MD.   If you develop nausea and vomiting that is not controlled by your nausea medication, call the clinic.   BELOW ARE SYMPTOMS THAT SHOULD BE REPORTED IMMEDIATELY:  *FEVER GREATER THAN 100.5 F  *CHILLS WITH OR WITHOUT FEVER  NAUSEA AND VOMITING THAT IS NOT CONTROLLED WITH YOUR NAUSEA MEDICATION  *UNUSUAL SHORTNESS OF BREATH  *UNUSUAL BRUISING OR BLEEDING  TENDERNESS IN MOUTH AND THROAT WITH OR WITHOUT PRESENCE OF ULCERS  *URINARY PROBLEMS  *BOWEL PROBLEMS  UNUSUAL RASH Items with * indicate a potential emergency and should be followed up as soon as possible.  Feel free to call the clinic should you have any questions or concerns. The clinic phone number is [phone].  Please show the [facility] at check-in to the Emergency Department and triage nurse.  Coronavirus (COVID-19) Are you at risk?  Are you at risk for the Coronavirus (COVID-19)?  To be considered HIGH RISK for Coronavirus (COVID-19), you have to meet the following criteria:  . Traveled to [place], [place], [place], [place] or [place]; or in the [place] to [place], [place], [place], or [place]; and have fever, cough, and shortness of breath within the last 2 weeks of travel OR . Been in close contact with a person diagnosed with COVID-19 within the last 2 weeks and have fever, cough, and shortness of breath . IF YOU DO NOT MEET THESE CRITERIA, YOU ARE CONSIDERED LOW RISK FOR COVID-19.  What to do if you are HIGH RISK for COVID-19?  [name] Kitchen If you are having a medical emergency, call 911. . Seek medical care right away. Before you go to a doctor's office, urgent care or emergency department, call ahead and tell them  about your recent travel, contact with someone diagnosed with COVID-19, and your symptoms. You should receive instructions from your physician's office regarding next steps of care.  . When you arrive at healthcare provider, tell the healthcare staff immediately you have returned from visiting [place], [place], [place], [place] or [place]; or traveled in the [place] to [place], [place], [place], or [place]; in the last two weeks or you have been in close contact with a person diagnosed with COVID-19 in the last 2 weeks.   . Tell the health care staff about your symptoms: fever, cough and shortness of breath. . After you have been seen by a medical provider, you will be either: o Tested for (COVID-19) and discharged home on quarantine except to seek medical care if symptoms worsen, and asked to  - Stay home and avoid contact with others until you get your results (4-5 days)  - Avoid travel on public transportation if possible (such as bus, train, or airplane) or o Sent to the Emergency Department by EMS for evaluation, COVID-19 testing, and possible admission depending on your condition and test results.  What to do if you are LOW RISK for COVID-19?  Reduce your risk of any infection by using the same precautions used for avoiding the common cold or flu:  [name] Kitchen Wash your hands often with soap and warm water for at least 20 seconds.  If soap and water are not readily available, use an alcohol-based hand sanitizer with at least 60% alcohol.  . If  coughing or sneezing, cover your mouth and nose by coughing or sneezing into the elbow areas of your shirt or coat, into a tissue or into your sleeve (not your hands). . Avoid shaking hands with others and consider head nods or verbal greetings only. . Avoid touching your eyes, nose, or mouth with unwashed hands.  . Avoid close contact with people who are sick. . Avoid places or events with large numbers of people in one location, like concerts or  sporting events. . Carefully consider travel plans you have or are making. . If you are planning any travel outside or inside the [place], visit the CDC's Travelers' Health webpage for the latest health notices. . If you have some symptoms but not all symptoms, continue to monitor at home and seek medical attention if your symptoms worsen. . If you are having a medical emergency, call 911.   [facility] / e-Visit: [email or web address]         [facility]: [facility]: [phone]                   [facility]: [phone]

## 2020-02-05 ENCOUNTER — Telehealth: Payer: Self-pay | Admitting: Hematology

## 2020-02-05 NOTE — Telephone Encounter (Signed)
Scheduled per 02/05 los, patient has been called and notified. Calender will be mailed.

## 2020-02-18 ENCOUNTER — Other Ambulatory Visit: Payer: Medicaid Other

## 2020-02-18 ENCOUNTER — Ambulatory Visit: Payer: Medicaid Other

## 2020-02-18 ENCOUNTER — Ambulatory Visit: Payer: Medicaid Other | Admitting: Hematology

## 2020-02-22 ENCOUNTER — Inpatient Hospital Stay: Payer: Medicaid Other

## 2020-02-22 ENCOUNTER — Inpatient Hospital Stay (HOSPITAL_BASED_OUTPATIENT_CLINIC_OR_DEPARTMENT_OTHER): Payer: Medicaid Other | Admitting: Hematology

## 2020-02-22 ENCOUNTER — Other Ambulatory Visit: Payer: Self-pay

## 2020-02-22 VITALS — BP 145/99 | HR 84 | Temp 98.2°F | Resp 18 | Ht 67.0 in | Wt 154.8 lb

## 2020-02-22 DIAGNOSIS — Z95828 Presence of other vascular implants and grafts: Secondary | ICD-10-CM

## 2020-02-22 DIAGNOSIS — C8128 Mixed cellularity classical Hodgkin lymphoma, lymph nodes of multiple sites: Secondary | ICD-10-CM | POA: Diagnosis not present

## 2020-02-22 DIAGNOSIS — Z7189 Other specified counseling: Secondary | ICD-10-CM

## 2020-02-22 DIAGNOSIS — C8198 Hodgkin lymphoma, unspecified, lymph nodes of multiple sites: Secondary | ICD-10-CM

## 2020-02-22 DIAGNOSIS — Z5112 Encounter for antineoplastic immunotherapy: Secondary | ICD-10-CM

## 2020-02-22 LAB — CBC WITH DIFFERENTIAL/PLATELET
Abs Immature Granulocytes: 0.01 10*3/uL (ref 0.00–0.07)
Basophils Absolute: 0 10*3/uL (ref 0.0–0.1)
Basophils Relative: 0 %
Eosinophils Absolute: 0.1 10*3/uL (ref 0.0–0.5)
Eosinophils Relative: 2 %
HCT: 34.5 % — ABNORMAL LOW (ref 36.0–46.0)
Hemoglobin: 11.5 g/dL — ABNORMAL LOW (ref 12.0–15.0)
Immature Granulocytes: 0 %
Lymphocytes Relative: 10 %
Lymphs Abs: 0.5 10*3/uL — ABNORMAL LOW (ref 0.7–4.0)
MCH: 33.6 pg (ref 26.0–34.0)
MCHC: 33.3 g/dL (ref 30.0–36.0)
MCV: 100.9 fL — ABNORMAL HIGH (ref 80.0–100.0)
Monocytes Absolute: 0.8 10*3/uL (ref 0.1–1.0)
Monocytes Relative: 16 %
Neutro Abs: 3.5 10*3/uL (ref 1.7–7.7)
Neutrophils Relative %: 72 %
Platelets: 210 10*3/uL (ref 150–400)
RBC: 3.42 MIL/uL — ABNORMAL LOW (ref 3.87–5.11)
RDW: 11.6 % (ref 11.5–15.5)
WBC: 4.9 10*3/uL (ref 4.0–10.5)
nRBC: 0 % (ref 0.0–0.2)

## 2020-02-22 LAB — CMP (CANCER CENTER ONLY)
ALT: 20 U/L (ref 0–44)
AST: 21 U/L (ref 15–41)
Albumin: 4 g/dL (ref 3.5–5.0)
Alkaline Phosphatase: 59 U/L (ref 38–126)
Anion gap: 9 (ref 5–15)
BUN: 11 mg/dL (ref 6–20)
CO2: 27 mmol/L (ref 22–32)
Calcium: 8.8 mg/dL — ABNORMAL LOW (ref 8.9–10.3)
Chloride: 103 mmol/L (ref 98–111)
Creatinine: 0.94 mg/dL (ref 0.44–1.00)
GFR, Est AFR Am: >60 mL/min (ref >60)
GFR, Estimated: >60 mL/min (ref >60)
Glucose, Bld: 83 mg/dL (ref 70–99)
Potassium: 4.2 mmol/L (ref 3.5–5.1)
Sodium: 139 mmol/L (ref 135–145)
Total Bilirubin: 0.9 mg/dL (ref 0.3–1.2)
Total Protein: 7 g/dL (ref 6.5–8.1)

## 2020-02-22 LAB — SEDIMENTATION RATE: Sed Rate: 2 mm/hr (ref 0–22)

## 2020-02-22 MED ORDER — SODIUM CHLORIDE 0.9% FLUSH
10.0000 mL | INTRAVENOUS | Status: DC | PRN
  Administered 2020-02-22: 14:00:00 10 mL
  Filled 2020-02-22: qty 10

## 2020-02-22 MED ORDER — ACETAMINOPHEN 325 MG PO TABS
650.0000 mg | ORAL_TABLET | Freq: Once | ORAL | Status: AC
  Administered 2020-02-22: 13:00:00 650 mg via ORAL

## 2020-02-22 MED ORDER — DIPHENHYDRAMINE HCL 25 MG PO CAPS
ORAL_CAPSULE | ORAL | Status: AC
  Filled 2020-02-22: qty 1

## 2020-02-22 MED ORDER — SODIUM CHLORIDE 0.9 % IV SOLN
Freq: Once | INTRAVENOUS | Status: AC
  Filled 2020-02-22: qty 250

## 2020-02-22 MED ORDER — FAMOTIDINE 20 MG PO TABS
40.0000 mg | ORAL_TABLET | Freq: Once | ORAL | Status: AC
  Administered 2020-02-22: 13:00:00 40 mg via ORAL

## 2020-02-22 MED ORDER — SODIUM CHLORIDE 0.9% FLUSH
10.0000 mL | INTRAVENOUS | Status: DC | PRN
  Administered 2020-02-22: 11:00:00 10 mL
  Filled 2020-02-22: qty 10

## 2020-02-22 MED ORDER — HEPARIN SOD (PORK) LOCK FLUSH 100 UNIT/ML IV SOLN
500.0000 [IU] | Freq: Once | INTRAVENOUS | Status: AC | PRN
  Administered 2020-02-22: 14:00:00 500 [IU]
  Filled 2020-02-22: qty 5

## 2020-02-22 MED ORDER — SODIUM CHLORIDE 0.9 % IV SOLN
200.0000 mg | Freq: Once | INTRAVENOUS | Status: AC
  Administered 2020-02-22: 13:00:00 200 mg via INTRAVENOUS
  Filled 2020-02-22: qty 8

## 2020-02-22 MED ORDER — FAMOTIDINE 20 MG PO TABS
ORAL_TABLET | ORAL | Status: AC
  Filled 2020-02-22: qty 2

## 2020-02-22 MED ORDER — DIPHENHYDRAMINE HCL 25 MG PO TABS
25.0000 mg | ORAL_TABLET | Freq: Once | ORAL | Status: AC
  Administered 2020-02-22: 25 mg via ORAL
  Filled 2020-02-22: qty 1

## 2020-02-22 MED ORDER — ACETAMINOPHEN 325 MG PO TABS
ORAL_TABLET | ORAL | Status: AC
  Filled 2020-02-22: qty 2

## 2020-02-22 NOTE — Progress Notes (Signed)
HEMATOLOGY ONCOLOGY PROGRESS NOTE  Date of service:   02/22/20     Heidi [name] Care Team: Heidi [name], No Pcp Per as PCP - General (General Practice)  Chief complaint: Follow-up for Hodgkin's lymphoma  Diagnosis:   Refractory Mixed cellularity Hodgkin's lymphoma IVBE with extensive lymphadenopathy including right axillary, mediastinal and upper retroperitoneal and now biopsy proven pulmonary involvement. She was noted to have significant constitutional symptoms including significant weight loss, fevers chills and some night sweats.  Current Treatment:  Pembrolizumab- 4th line. Heidi [name] declined HDT/Auto HSCT or consideration of Car-T cell therapy.  Previous treatment  5 cycles of AVD (without bleomycin due to lung involvement and DLCO of 37%, active smoker) Multiple avoidable treatment delays due to the Heidi [name]'s noncompliance with follow-up for avoidable reasons. She has been counseled repeatedly that this would increase the likelihood of unfavorable outcome.  2nd line therapy with Bendamustine + Brentuximab s/p 7 cycles.  3rd line therapy with ICE s/p 2 cycles   INTERVAL HISTORY:  [name] is here for follow-up for her Hodgkins lymphoma for C17D1 Pembrolizumab treatment. The Heidi [name]'s last visit with [place] was on 02/01/2020. The pt reports that she is doing well overall.  The pt reports is doing well. She has no new concerns.    Lab results today (02/22/20) of CBC w/diff and CMP is as follows: all values are WNL except for RBC at 3.42, Hgb at 11.5, HCT at 34.5, MCV at 100.9, Lymphs Abs at 0.5K, Calcium at 8.8. 02/22/2020 Sed Rate at 2  On review of systems, pt denies diarrhea, skin rashes, mouth sores, changes in breathing, abdominal pain, new lumps/bumps and any other symptoms.    REVIEW OF SYSTEMS:   A 10+ POINT REVIEW OF SYSTEMS WAS OBTAINED including neurology, dermatology, psychiatry, cardiac, respiratory, lymph, extremities, GI, GU, Musculoskeletal, constitutional,  breasts, reproductive, HEENT.  All pertinent positives are noted in the HPI.  All others are negative.  . Past Medical History:  Diagnosis Date  . ARDS (adult respiratory distress syndrome) ([place])   . Asthma   . Hodgkin lymphoma ([place])   . Hypertension     . Past Surgical History:  Procedure Laterality Date  . AXILLARY LYMPH NODE BIOPSY Right 03/19/2016   Procedure: AXILLARY LYMPH NODE BIOPSY;  Surgeon: Armandina Gemma, MD;  Location: [facility];  Service: General;  Laterality: Right;  . IR GENERIC HISTORICAL  12/22/2016   IR FLUORO GUIDE PORT INSERTION LEFT 12/22/2016 [facility]  . IR GENERIC HISTORICAL  12/22/2016   IR US GUIDE VASC ACCESS LEFT 12/22/2016 [facility]  . IR GENERIC HISTORICAL  12/22/2016   IR CV LINE INJECTION 12/22/2016 [facility]  . IR GENERIC HISTORICAL  12/22/2016   IR REMOVAL TUN ACCESS W/ PORT W/O FL MOD SED 12/22/2016 [facility]  . VIDEO BRONCHOSCOPY Bilateral 11/26/2016   Procedure: VIDEO BRONCHOSCOPY WITH FLUORO;  Surgeon: Rigoberto Noel, MD;  Location: [facility];  Service: Cardiopulmonary;  Laterality: Bilateral;    . Social History   Tobacco Use  . Smoking status: Former Smoker    Packs/day: 0.50    Years: 15.00    Pack years: 7.50    Types: Cigarettes    Quit date: 03/27/2016    Years since quitting: 3.9  . Smokeless tobacco: Never Used  Substance Use Topics  . Alcohol use: Yes    Comment: occasional now  . Drug use: Not on file    ALLERGIES:  has No Known Allergies.  MEDICATIONS:  Current Outpatient Medications  Medication Sig Dispense  Refill  . acyclovir (ZOVIRAX) 400 MG tablet Take 1 tablet (400 mg total) by mouth 2 (two) times daily. Start after completion of Valtrex (Heidi [name] not taking: Reported on 01/11/2020) 60 tablet 6  . amitriptyline (ELAVIL) 10 MG tablet Take 1 tablet (10 mg total) by mouth at bedtime. (Heidi [name] not taking: Reported on 01/11/2020) 20 tablet 0  . amoxicillin-clavulanate (AUGMENTIN) 875-125 MG tablet Take 1 tablet  by mouth every 12 (twelve) hours. (Heidi [name] not taking: Reported on 01/11/2020) 14 tablet 0  . gabapentin (NEURONTIN) 300 MG capsule Take 1 capsule (300 mg total) by mouth 2 (two) times daily. (Heidi [name] not taking: Reported on 01/11/2020) 60 capsule 0  . oxyCODONE-acetaminophen (PERCOCET/ROXICET) 5-325 MG tablet Take 1-2 tablets by mouth every 6 (six) hours as needed for severe pain. (Heidi [name] not taking: Reported on 01/11/2020) 50 tablet 0   No current facility-administered medications for this visit.   Facility-Administered Medications Ordered in Other Visits  Medication Dose Route Frequency Provider Last Rate Last Admin  . acetaminophen (TYLENOL) tablet 650 mg  650 mg Oral Once Brunetta Genera, MD      . diphenhydrAMINE (BENADRYL) tablet 25 mg  25 mg Oral Once Brunetta Genera, MD      . famotidine (PEPCID) tablet 40 mg  40 mg Oral Once Brunetta Genera, MD      . heparin lock flush 100 unit/mL  500 Units Intracatheter Once PRN Brunetta Genera, MD      . pembrolizumab [facility]) 200 mg in sodium chloride 0.9 % 50 mL chemo infusion  200 mg Intravenous Once Brunetta Genera, MD      . sodium chloride flush (NS) 0.9 % injection 10 mL  10 mL Intracatheter PRN Brunetta Genera, MD      . sodium chloride flush (NS) 0.9 % injection 10 mL  10 mL Intracatheter PRN Brunetta Genera, MD        PHYSICAL EXAMINATION: ECOG FS:1 - Symptomatic but completely ambulatory  Vitals:   02/22/20 1146  BP: (!) 145/99  Pulse: 84  Resp: 18  Temp: 98.2 F (36.8 C)  SpO2: 100%   Wt Readings from Last 3 Encounters:  02/22/20 154 lb 12.8 oz (70.2 kg)  02/01/20 151 lb 12.8 oz (68.9 kg)  01/11/20 147 lb 8 oz (66.9 kg)   Body mass index is 24.25 kg/m.    GENERAL:alert, in no acute distress and comfortable SKIN: no acute rashes, no significant lesions EYES: conjunctiva are pink and non-injected, sclera anicteric OROPHARYNX: MMM, no exudates, no oropharyngeal erythema or  ulceration NECK: supple, no JVD LYMPH:  no palpable lymphadenopathy in the cervical, axillary or inguinal regions  LUNGS: clear to auscultation b/l with normal respiratory effort HEART: regular rate & rhythm ABDOMEN:  normoactive bowel sounds , non tender, not distended. No palpable hepatosplenomegaly.  Extremity: no pedal edema PSYCH: alert & oriented x 3 with fluent speech NEURO: no focal motor/sensory deficits  LABORATORY DATA:   I have reviewed the data as listed  . CBC Latest Ref Rng & Units 02/22/2020 02/01/2020 01/11/2020  WBC 4.0 - 10.5 K/uL 4.9 3.9(L) 3.3(L)  Hemoglobin 12.0 - 15.0 g/dL 11.5(L) 12.0 11.7(L)  Hematocrit 36.0 - 46.0 % 34.5(L) 35.1(L) 34.4(L)  Platelets 150 - 400 K/uL 210 204 205   . CBC    Component Value Date/Time   WBC 4.9 02/22/2020 1046   RBC 3.42 (L) 02/22/2020 1046   HGB 11.5 (L) 02/22/2020 1046   HGB 10.6 (L) 11/07/2018 1047  HGB 11.7 07/20/2017 1315   HCT 34.5 (L) 02/22/2020 1046   HCT 35.2 07/20/2017 1315   PLT 210 02/22/2020 1046   PLT 473 (H) 11/07/2018 1047   PLT 266 07/20/2017 1315   MCV 100.9 (H) 02/22/2020 1046   MCV 93.2 07/20/2017 1315   MCH 33.6 02/22/2020 1046   MCHC 33.3 02/22/2020 1046   RDW 11.6 02/22/2020 1046   RDW 14.5 07/20/2017 1315   LYMPHSABS 0.5 (L) 02/22/2020 1046   LYMPHSABS 0.3 (L) 07/20/2017 1315   MONOABS 0.8 02/22/2020 1046   MONOABS 0.5 07/20/2017 1315   EOSABS 0.1 02/22/2020 1046   EOSABS 0.2 07/20/2017 1315   BASOSABS 0.0 02/22/2020 1046   BASOSABS 0.0 07/20/2017 1315   . CMP Latest Ref Rng & Units 02/22/2020 02/01/2020 01/11/2020  Glucose 70 - 99 mg/dL 83 99 89  BUN 6 - 20 mg/dL 11 16 9   Creatinine 0.44 - 1.00 mg/dL 0.94 1.00 0.88  Sodium 135 - 145 mmol/L 139 139 137  Potassium 3.5 - 5.1 mmol/L 4.2 4.3 4.0  Chloride 98 - 111 mmol/L 103 103 101  CO2 22 - 32 mmol/L 27 25 27   Calcium 8.9 - 10.3 mg/dL 8.8(L) 8.9 8.7(L)  Total Protein 6.5 - 8.1 g/dL 7.0 7.0 7.0  Total Bilirubin 0.3 - 1.2 mg/dL 0.9 0.3 0.6   Alkaline Phos 38 - 126 U/L 59 69 71  AST 15 - 41 U/L 21 27 60(H)  ALT 0 - 44 U/L 20 24 55(H)     RADIOGRAPHIC STUDIES: I have personally reviewed the radiological images as listed and agreed with the findings in the report. CT CHEST W CONTRAST  Result Date: 01/31/2020 CLINICAL DATA:  History of mixed cellularity Hodgkin's lymphoma with extensive adenopathy, now with biopsy-proven pulmonary involvement. EXAM: CT CHEST, ABDOMEN, AND PELVIS WITH CONTRAST TECHNIQUE: Multidetector CT imaging of the chest, abdomen and pelvis was performed following the standard protocol during bolus administration of intravenous contrast. CONTRAST:  <See Chart> OMNIPAQUE IOHEXOL 300 MG/ML  SOLN COMPARISON:  PET exam dated 06/13/2019 FINDINGS: CT CHEST FINDINGS Cardiovascular: Left-sided Port-A-Cath terminates at the caval to atrial junction. Shift of mediastinal structures into the right chest similar to prior study. Central pulmonary arteries are engorged at 2.5 cm greatest caliber. Narrowing of the SVC similar to previous exams. Collateral formation at the upper chest in this Heidi [name] with history of SVC like syndrome/symptoms. No pericardial effusion. Mediastinum/Nodes: No signs of mediastinal lymphadenopathy. No signs of axillary lymphadenopathy. No thoracic inlet adenopathy. Esophagus grossly normal. Thoracic inlet structures are unremarkable. Lungs/Pleura: Volume loss in the right chest is with shift of mediastinal structures as before. Signs of bronchiectasis in the right upper lobe along with fibrotic changes are unchanged. No signs of pleural effusion. No new area of consolidation or suspicious nodule. Subtle ground-glass attenuation in the left chest in the lingula and upper lobe similar to the prior exam. Musculoskeletal: No signs of chest wall mass. CT ABDOMEN PELVIS FINDINGS Hepatobiliary: No focal suspicious hepatic lesion. Focal fat along the fissure for false form ligament. No signs of biliary ductal dilation or  pericholecystic inflammation. Pancreas: Pancreas is normal without signs of ductal dilation or peripancreatic inflammation. Spleen: Spleen is normal. Adrenals/Urinary Tract: Adrenal glands are normal size and configuration. No sign of hydronephrosis. Symmetric renal enhancement. Urinary bladder is normal. Stomach/Bowel: No sign of acute gastrointestinal process. Enteric contrast passes into the colon largely contained within the colon on today's exam. Vascular/Lymphatic: Patent abdominal vasculature. No signs of aneurysm in the abdomen. No  signs of adenopathy in the retroperitoneum or in the upper abdomen. No signs of pelvic lymphadenopathy. Reproductive: Tampon in the vagina. Uterus and adnexa unremarkable by CT. Other: No abdominal wall hernia or abnormality. No abdominopelvic ascites. Musculoskeletal: Numerous of sclerotic change mainly in the lower thoracic and lumbar spine. Signs of sclerosis throughout the bilateral iliac bones, pubic bones and ischial bones. No signs of destructive bone lesion. No acute bone process. IMPRESSION: 1. Small pre pericardial lymph node, not present on the prior study. 2. Stable volume loss in the right chest with shift of mediastinal structures into the right chest. 3. No change in the appearance of multifocal bony sclerosis, without signs of FDG uptake on the previous exam, attention on follow-up. Electronically Signed   By: Zetta Bills M.D.   On: 01/31/2020 11:20   CT ABDOMEN PELVIS W CONTRAST  Result Date: 01/31/2020 CLINICAL DATA:  History of mixed cellularity Hodgkin's lymphoma with extensive adenopathy, now with biopsy-proven pulmonary involvement. EXAM: CT CHEST, ABDOMEN, AND PELVIS WITH CONTRAST TECHNIQUE: Multidetector CT imaging of the chest, abdomen and pelvis was performed following the standard protocol during bolus administration of intravenous contrast. CONTRAST:  <See Chart> OMNIPAQUE IOHEXOL 300 MG/ML  SOLN COMPARISON:  PET exam dated 06/13/2019 FINDINGS: CT  CHEST FINDINGS Cardiovascular: Left-sided Port-A-Cath terminates at the caval to atrial junction. Shift of mediastinal structures into the right chest similar to prior study. Central pulmonary arteries are engorged at 2.5 cm greatest caliber. Narrowing of the SVC similar to previous exams. Collateral formation at the upper chest in this Heidi [name] with history of SVC like syndrome/symptoms. No pericardial effusion. Mediastinum/Nodes: No signs of mediastinal lymphadenopathy. No signs of axillary lymphadenopathy. No thoracic inlet adenopathy. Esophagus grossly normal. Thoracic inlet structures are unremarkable. Lungs/Pleura: Volume loss in the right chest is with shift of mediastinal structures as before. Signs of bronchiectasis in the right upper lobe along with fibrotic changes are unchanged. No signs of pleural effusion. No new area of consolidation or suspicious nodule. Subtle ground-glass attenuation in the left chest in the lingula and upper lobe similar to the prior exam. Musculoskeletal: No signs of chest wall mass. CT ABDOMEN PELVIS FINDINGS Hepatobiliary: No focal suspicious hepatic lesion. Focal fat along the fissure for false form ligament. No signs of biliary ductal dilation or pericholecystic inflammation. Pancreas: Pancreas is normal without signs of ductal dilation or peripancreatic inflammation. Spleen: Spleen is normal. Adrenals/Urinary Tract: Adrenal glands are normal size and configuration. No sign of hydronephrosis. Symmetric renal enhancement. Urinary bladder is normal. Stomach/Bowel: No sign of acute gastrointestinal process. Enteric contrast passes into the colon largely contained within the colon on today's exam. Vascular/Lymphatic: Patent abdominal vasculature. No signs of aneurysm in the abdomen. No signs of adenopathy in the retroperitoneum or in the upper abdomen. No signs of pelvic lymphadenopathy. Reproductive: Tampon in the vagina. Uterus and adnexa unremarkable by CT. Other: No abdominal  wall hernia or abnormality. No abdominopelvic ascites. Musculoskeletal: Numerous of sclerotic change mainly in the lower thoracic and lumbar spine. Signs of sclerosis throughout the bilateral iliac bones, pubic bones and ischial bones. No signs of destructive bone lesion. No acute bone process. IMPRESSION: 1. Small pre pericardial lymph node, not present on the prior study. 2. Stable volume loss in the right chest with shift of mediastinal structures into the right chest. 3. No change in the appearance of multifocal bony sclerosis, without signs of FDG uptake on the previous exam, attention on follow-up. Electronically Signed   By: Jewel Baize.D.  On: 01/31/2020 11:20    ASSESSMENT & PLAN:   34 y.o. 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 She was noted to have significant constitutional symptoms including significant weight loss, fevers chills and some night sweats. HIV negative Hepatitis C and hepatitis B serologies negative. Echo with normal ejection fraction. Heidi [name] was treated with 5 cycles of AVD (Bleomycin held due to poor DLCO 37% and ongoing smoking). Multiple avoidable treatment delays due to the Heidi [name]'s noncompliance with follow-up for avoidable reasons. She has been counseled repeatedly that this would increase the likelihood of unfavorable outcome.  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 S/p 6 cycles of 2nd line treatment with Bendamustine/Brentuximab And 1 cycle of Bretuximab alone  -PET/CT scan results from 04/14/2017 were discussed in details. She appears to have some persistent disease in her right lung at Deauville 5. It is difficult to say if this is recurrent or persistent disease since the Heidi [name] failed to follow-up on multiple scheduled  PET/CT scans in the early part and prior to her second line treatment.  Heidi [name] was lost to followup for >1 yr  09/26/18 PET/CT revealed Imaging findings compatible with recurrence of disease. 2. Multiple new large areas of hypermetabolic nodularity and airspace consolidation within both lungs which is presumed to represent pulmonary involvement by lymphoma. Deauville criteria 5. 3. New hypermetabolic left supraclavicular, left retroperitoneal, and bilateral pelvic lymph nodes. Deauville criteria 5. 4. Multifocal hypermetabolic osseous lesions. Deauville criteria 5. 5. Small volume of ascites, new.   12/06/18 PET/CT revealed Generally improved appearance with previously mostly Deauville 5 activity now mostly Deauville 4 activity. 2. Layering of much of the airspace opacity in the lungs, although considerable right perihilar airspace opacity remains. The remaining pulmonary opacities are assess this Deauville 5 on the right and over L4 on the left, and were previously Deauville 5 bilaterally. 3. Mildly reduced size and moderately reduced activity in the abdominopelvic lymph nodes which are now dove L4. 4. The previously seen left supraclavicular lymph node seems to have completely resolved (Deauville 0). 5. The skeletal lesions remain at Deauville 4, although in absolute terms have decreased in SUV compared to previous. 6. No new regions of malignant involvement compared to prior exam. 7. Other imaging findings of potential clinical significance: Low-density blood pool suggests anemia. Pectus excavatum. Volume loss in the right hemithorax.  06/13/19 PET/CT revealed "No abnormal hypermetabolism (Deauville 1). 2. Mixed lytic and sclerotic osseous lesions appear more prominent than on 12/06/2018 but do not have associated hypermetabolism, indicative of interval healing." 01/31/2020 CT ABDOMEN PELVIS W CONTRAST (Accession [id number]) and CT CHEST W CONTRAST (Accession [id number]) revealed "1. Small pre  pericardial lymph node, not present on the prior study. 2. Stable volume loss in the right chest with shift of mediastinal structures into the right chest. 3. No change in the appearance of multifocal bony sclerosis, without signs of FDG uptake on the previous exam, attention on follow-up."  #2 s/p hypoxic respiratory failurewith dense right lung consolidation and left upper lobe consolidation with SVC syndrome. Heidi [name] has completed palliative radiation to the right lung mass causing SVC compression.  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  #4 Non compliancewith clinic and treatment followup. Missed 2nd dose of  bendamustine with C2. Has missed multiple appointment for her PET/CT and missed appointment at [facility] for consideration of Transplant.  Pt was lost to follow up after July 2018 and returned on 08/31/18 Discussed the Heidi [name]'s goals of care and the pt noted that she is ready to begin treatment again and maintain compliance and follow ups   #5 h/o Grade 1 neuropathyfrom Brentuximab-Vedotin - resolved  #6 Shingles outbreak - curentlyresolved but with significant post herpetic neuralgia  #7 Significant anemia and thrombocytopenia after C1 of ICE -- will need close monitoring.  #8 DVT Prophylaxis - lovenox  #9 Abnormal LFTs ? Related to Pembrolizumab - stable today  PLAN: -Discussed pt labwork today, 02/22/20; all values are WNL except for RBC at 3.42, Hgb at 11.5, HCT at 34.5, MCV at 100.9, Lymphs Abs at 0.5K, Calcium at 8.8. -WBC have normalized, liver enzymes are steady  -Discussed 02/22/2020 Sed Rate is normal at 2  -Discussed that she is still in remission  -No overt lab, clinical or radiographic evidence of CHL progression at this time. -Will space out follow-up -Will see back with every  other treatment  FOLLOW UP: -f/u as per scheduled appointments for next 4 doses of Pembrolizumab  . The total time spent in the appointment was 15 minutes and more than 50% was on counseling and direct Heidi [name] cares.    All of the Heidi [name]'s questions were answered with apparent satisfaction. The Heidi [name] knows to call the clinic with any problems, questions or concerns.    Sullivan Lone MD [place] AAHIVMS [facility] Hematology/Oncology Physician [facility]  (Office):       [phone] (Work cell):  [phone] (Fax):           [phone]   I, Yevette Edwards, am acting as a scribe for Dr. Sullivan Lone.   .I have reviewed the above documentation for accuracy and completeness, and I agree with the above. Brunetta Genera MD

## 2020-02-22 NOTE — Patient Instructions (Signed)
[facility] Discharge Instructions for Patients Receiving Chemotherapy  Today you received the following Immunotherapy agent: Pembolizumab  To help prevent nausea and vomiting after your treatment, we encourage you to take your nausea medication as directed by your MD.   If you develop nausea and vomiting that is not controlled by your nausea medication, call the clinic.   BELOW ARE SYMPTOMS THAT SHOULD BE REPORTED IMMEDIATELY:  *FEVER GREATER THAN 100.5 F  *CHILLS WITH OR WITHOUT FEVER  NAUSEA AND VOMITING THAT IS NOT CONTROLLED WITH YOUR NAUSEA MEDICATION  *UNUSUAL SHORTNESS OF BREATH  *UNUSUAL BRUISING OR BLEEDING  TENDERNESS IN MOUTH AND THROAT WITH OR WITHOUT PRESENCE OF ULCERS  *URINARY PROBLEMS  *BOWEL PROBLEMS  UNUSUAL RASH Items with * indicate a potential emergency and should be followed up as soon as possible.  Feel free to call the clinic should you have any questions or concerns. The clinic phone number is [phone].  Please show the [place] at check-in to the Emergency Department and triage nurse.  Coronavirus (COVID-19) Are you at risk?  Are you at risk for the Coronavirus (COVID-19)?  To be considered HIGH RISK for Coronavirus (COVID-19), you have to meet the following criteria:  . Traveled to [place], [place], [place], [place] or [place]; or in the [place] to [place], [place], [place], or [place]; and have fever, cough, and shortness of breath within the last 2 weeks of travel OR . Been in close contact with a person diagnosed with COVID-19 within the last 2 weeks and have fever, cough, and shortness of breath . IF YOU DO NOT MEET THESE CRITERIA, YOU ARE CONSIDERED LOW RISK FOR COVID-19.  What to do if you are HIGH RISK for COVID-19?  [name] Kitchen If you are having a medical emergency, call 911. . Seek medical care right away. Before you go to a doctor's office, urgent care or emergency department, call ahead and tell them  about your recent travel, contact with someone diagnosed with COVID-19, and your symptoms. You should receive instructions from your physician's office regarding next steps of care.  . When you arrive at healthcare provider, tell the healthcare staff immediately you have returned from visiting [place], [place], [place], [place] or [place]; or traveled in the [place] to [place], [place], [place], or [place]; in the last two weeks or you have been in close contact with a person diagnosed with COVID-19 in the last 2 weeks.   . Tell the health care staff about your symptoms: fever, cough and shortness of breath. . After you have been seen by a medical provider, you will be either: o Tested for (COVID-19) and discharged home on quarantine except to seek medical care if symptoms worsen, and asked to  - Stay home and avoid contact with others until you get your results (4-5 days)  - Avoid travel on public transportation if possible (such as bus, train, or airplane) or o Sent to the Emergency Department by EMS for evaluation, COVID-19 testing, and possible admission depending on your condition and test results.  What to do if you are LOW RISK for COVID-19?  Reduce your risk of any infection by using the same precautions used for avoiding the common cold or flu:  [name] Kitchen Wash your hands often with soap and warm water for at least 20 seconds.  If soap and water are not readily available, use an alcohol-based hand sanitizer with at least 60% alcohol.  . If  coughing or sneezing, cover your mouth and nose by coughing or sneezing into the elbow areas of your shirt or coat, into a tissue or into your sleeve (not your hands). . Avoid shaking hands with others and consider head nods or verbal greetings only. . Avoid touching your eyes, nose, or mouth with unwashed hands.  . Avoid close contact with people who are sick. . Avoid places or events with large numbers of people in one location, like concerts or  sporting events. . Carefully consider travel plans you have or are making. . If you are planning any travel outside or inside the [place], visit the CDC's Travelers' Health webpage for the latest health notices. . If you have some symptoms but not all symptoms, continue to monitor at home and seek medical attention if your symptoms worsen. . If you are having a medical emergency, call 911.   [place] / e-Visit: [email or web address]         [facility]: [facility]: [phone]                   [facility]: [phone]

## 2020-03-14 ENCOUNTER — Inpatient Hospital Stay: Payer: Medicaid Other

## 2020-03-14 ENCOUNTER — Other Ambulatory Visit: Payer: Self-pay

## 2020-03-14 ENCOUNTER — Inpatient Hospital Stay: Payer: Medicaid Other | Attending: Hematology

## 2020-03-14 VITALS — BP 123/78 | HR 72 | Temp 97.9°F | Resp 16

## 2020-03-14 DIAGNOSIS — D696 Thrombocytopenia, unspecified: Secondary | ICD-10-CM | POA: Insufficient documentation

## 2020-03-14 DIAGNOSIS — C8198 Hodgkin lymphoma, unspecified, lymph nodes of multiple sites: Secondary | ICD-10-CM

## 2020-03-14 DIAGNOSIS — Z5112 Encounter for antineoplastic immunotherapy: Secondary | ICD-10-CM

## 2020-03-14 DIAGNOSIS — Z79899 Other long term (current) drug therapy: Secondary | ICD-10-CM | POA: Diagnosis not present

## 2020-03-14 DIAGNOSIS — Z7189 Other specified counseling: Secondary | ICD-10-CM

## 2020-03-14 DIAGNOSIS — Z95828 Presence of other vascular implants and grafts: Secondary | ICD-10-CM

## 2020-03-14 DIAGNOSIS — Z7901 Long term (current) use of anticoagulants: Secondary | ICD-10-CM | POA: Diagnosis not present

## 2020-03-14 DIAGNOSIS — C8128 Mixed cellularity classical Hodgkin lymphoma, lymph nodes of multiple sites: Secondary | ICD-10-CM | POA: Insufficient documentation

## 2020-03-14 DIAGNOSIS — F17210 Nicotine dependence, cigarettes, uncomplicated: Secondary | ICD-10-CM | POA: Diagnosis not present

## 2020-03-14 DIAGNOSIS — D649 Anemia, unspecified: Secondary | ICD-10-CM | POA: Insufficient documentation

## 2020-03-14 DIAGNOSIS — Z86718 Personal history of other venous thrombosis and embolism: Secondary | ICD-10-CM | POA: Insufficient documentation

## 2020-03-14 LAB — CMP (CANCER CENTER ONLY)
ALT: 42 U/L (ref 0–44)
AST: 63 U/L — ABNORMAL HIGH (ref 15–41)
Albumin: 4 g/dL (ref 3.5–5.0)
Alkaline Phosphatase: 65 U/L (ref 38–126)
Anion gap: 9 (ref 5–15)
BUN: 10 mg/dL (ref 6–20)
CO2: 27 mmol/L (ref 22–32)
Calcium: 9.3 mg/dL (ref 8.9–10.3)
Chloride: 101 mmol/L (ref 98–111)
Creatinine: 0.88 mg/dL (ref 0.44–1.00)
GFR, Est AFR Am: >60 mL/min (ref >60)
GFR, Estimated: >60 mL/min (ref >60)
Glucose, Bld: 75 mg/dL (ref 70–99)
Potassium: 4.3 mmol/L (ref 3.5–5.1)
Sodium: 137 mmol/L (ref 135–145)
Total Bilirubin: 0.8 mg/dL (ref 0.3–1.2)
Total Protein: 6.9 g/dL (ref 6.5–8.1)

## 2020-03-14 LAB — CBC WITH DIFFERENTIAL/PLATELET
Abs Immature Granulocytes: 0.01 10*3/uL (ref 0.00–0.07)
Basophils Absolute: 0 10*3/uL (ref 0.0–0.1)
Basophils Relative: 0 %
Eosinophils Absolute: 0.1 10*3/uL (ref 0.0–0.5)
Eosinophils Relative: 2 %
HCT: 34.2 % — ABNORMAL LOW (ref 36.0–46.0)
Hemoglobin: 11.6 g/dL — ABNORMAL LOW (ref 12.0–15.0)
Immature Granulocytes: 0 %
Lymphocytes Relative: 10 %
Lymphs Abs: 0.4 10*3/uL — ABNORMAL LOW (ref 0.7–4.0)
MCH: 33 pg (ref 26.0–34.0)
MCHC: 33.9 g/dL (ref 30.0–36.0)
MCV: 97.2 fL (ref 80.0–100.0)
Monocytes Absolute: 0.7 10*3/uL (ref 0.1–1.0)
Monocytes Relative: 19 %
Neutro Abs: 2.6 10*3/uL (ref 1.7–7.7)
Neutrophils Relative %: 69 %
Platelets: 170 10*3/uL (ref 150–400)
RBC: 3.52 MIL/uL — ABNORMAL LOW (ref 3.87–5.11)
RDW: 11.8 % (ref 11.5–15.5)
WBC: 3.8 10*3/uL — ABNORMAL LOW (ref 4.0–10.5)
nRBC: 0 % (ref 0.0–0.2)

## 2020-03-14 LAB — SEDIMENTATION RATE: Sed Rate: 5 mm/hr (ref 0–22)

## 2020-03-14 MED ORDER — SODIUM CHLORIDE 0.9% FLUSH
10.0000 mL | INTRAVENOUS | Status: DC | PRN
  Administered 2020-03-14: 10 mL
  Filled 2020-03-14: qty 10

## 2020-03-14 MED ORDER — ACETAMINOPHEN 325 MG PO TABS
650.0000 mg | ORAL_TABLET | Freq: Once | ORAL | Status: AC
  Administered 2020-03-14: 650 mg via ORAL

## 2020-03-14 MED ORDER — HEPARIN SOD (PORK) LOCK FLUSH 100 UNIT/ML IV SOLN
500.0000 [IU] | Freq: Once | INTRAVENOUS | Status: DC | PRN
  Filled 2020-03-14: qty 5

## 2020-03-14 MED ORDER — FAMOTIDINE 20 MG PO TABS
ORAL_TABLET | ORAL | Status: AC
  Filled 2020-03-14: qty 2

## 2020-03-14 MED ORDER — SODIUM CHLORIDE 0.9 % IV SOLN
Freq: Once | INTRAVENOUS | Status: AC
  Filled 2020-03-14: qty 250

## 2020-03-14 MED ORDER — FAMOTIDINE 20 MG PO TABS
40.0000 mg | ORAL_TABLET | Freq: Once | ORAL | Status: AC
  Administered 2020-03-14: 40 mg via ORAL

## 2020-03-14 MED ORDER — ACETAMINOPHEN 325 MG PO TABS
ORAL_TABLET | ORAL | Status: AC
  Filled 2020-03-14: qty 2

## 2020-03-14 MED ORDER — SODIUM CHLORIDE 0.9 % IV SOLN
200.0000 mg | Freq: Once | INTRAVENOUS | Status: AC
  Administered 2020-03-14: 200 mg via INTRAVENOUS
  Filled 2020-03-14: qty 8

## 2020-03-14 MED ORDER — DIPHENHYDRAMINE HCL 25 MG PO TABS
25.0000 mg | ORAL_TABLET | Freq: Once | ORAL | Status: AC
  Administered 2020-03-14: 25 mg via ORAL
  Filled 2020-03-14: qty 1

## 2020-03-14 MED ORDER — DIPHENHYDRAMINE HCL 25 MG PO CAPS
ORAL_CAPSULE | ORAL | Status: AC
  Filled 2020-03-14: qty 1

## 2020-03-14 MED ORDER — HEPARIN SOD (PORK) LOCK FLUSH 100 UNIT/ML IV SOLN
500.0000 [IU] | Freq: Once | INTRAVENOUS | Status: AC | PRN
  Administered 2020-03-14: 500 [IU]
  Filled 2020-03-14: qty 5

## 2020-03-14 NOTE — Patient Instructions (Signed)
[facility] Discharge Instructions for Patients Receiving Chemotherapy  Today you received the following chemotherapy agents Pembrolizumab (KEYTRUDA).  To help prevent nausea and vomiting after your treatment, we encourage you to take your nausea medication as prescribed.   If you develop nausea and vomiting that is not controlled by your nausea medication, call the clinic.   BELOW ARE SYMPTOMS THAT SHOULD BE REPORTED IMMEDIATELY:  *FEVER GREATER THAN 100.5 F  *CHILLS WITH OR WITHOUT FEVER  NAUSEA AND VOMITING THAT IS NOT CONTROLLED WITH YOUR NAUSEA MEDICATION  *UNUSUAL SHORTNESS OF BREATH  *UNUSUAL BRUISING OR BLEEDING  TENDERNESS IN MOUTH AND THROAT WITH OR WITHOUT PRESENCE OF ULCERS  *URINARY PROBLEMS  *BOWEL PROBLEMS  UNUSUAL RASH Items with * indicate a potential emergency and should be followed up as soon as possible.  Feel free to call the clinic should you have any questions or concerns. The clinic phone number is [phone].  Please show the CHEMO ALERT CARD at check-in to the Emergency Department and triage nurse.   

## 2020-03-31 NOTE — Progress Notes (Signed)
Pharmacist Chemotherapy Monitoring - Follow Up Assessment    I verify that I have reviewed each item in the below checklist:  . Regimen for the patient is scheduled for the appropriate day and plan matches scheduled date. [name] Kitchen Appropriate non-routine labs are ordered dependent on drug ordered. . If applicable, additional medications reviewed and ordered per protocol based on lifetime cumulative doses and/or treatment regimen.   Plan for follow-up and/or issues identified: No . I-vent associated with next due treatment: No . MD and/or nursing notified: No  Blimie Vaness K 03/31/2020 12:55 PM

## 2020-04-03 NOTE — Progress Notes (Signed)
HEMATOLOGY ONCOLOGY PROGRESS NOTE  Date of service:   04/04/20     Patient Care Team: Patient, No Pcp Per as PCP - General (General Practice)  Chief complaint: Follow-up for Hodgkin's lymphoma  Diagnosis:   Refractory Mixed cellularity Hodgkin's lymphoma IVBE with extensive lymphadenopathy including right axillary, mediastinal and upper retroperitoneal and now biopsy proven pulmonary involvement. She was noted to have significant constitutional symptoms including significant weight loss, fevers chills and some night sweats.  Current Treatment:  Pembrolizumab- 4th line. Patient declined HDT/Auto HSCT or consideration of Car-T cell therapy.  Previous treatment  5 cycles of AVD (without bleomycin due to lung involvement and DLCO of 37%, active smoker) Multiple avoidable treatment delays due to the patient's noncompliance with follow-up for avoidable reasons. She has been counseled repeatedly that this would increase the likelihood of unfavorable outcome.  2nd line therapy with Bendamustine + Brentuximab s/p 7 cycles.  3rd line therapy with ICE s/p 2 cycles   INTERVAL HISTORY:  Heidi [name] is here for follow-up for her Hodgkins lymphoma for C19D1 Pembrolizumab treatment. The patient's last visit with [place] was on 02/22/20. The pt reports that she is doing well overall.  The pt reports she is good. Pt drinks about 40 ounces of beer every other day. She has not taken any tylenol but has been taking Advil PM, maybe twice a week before bed.   Lab results today (04/04/20) of CBC w/diff and CMP is as follows: all values are WNL except for WBC at 3.2K, RBC at 3.47, Hemoglobin at 11.5, HCT at 35, MCV at 100.9, Lymph's Abs at 0.3K, Calcium at 8.8, AST at 134, ALT at 127 04/04/20 of Sedimentation Rate at 10  On review of systems, pt reports healthy appetite, sleeping well and denies unexpected weight loss, nausea, fevers, diarrhea, night sweats, spinal pain, abdominal pain, leg swelling, mouth  soars and any other symptoms.   REVIEW OF SYSTEMS:   A 10+ POINT REVIEW OF SYSTEMS WAS OBTAINED including neurology, dermatology, psychiatry, cardiac, respiratory, lymph, extremities, GI, GU, Musculoskeletal, constitutional, breasts, reproductive, HEENT.  All pertinent positives are noted in the HPI.  All others are negative.  . Past Medical History:  Diagnosis Date  . ARDS (adult respiratory distress syndrome) ([place])   . Asthma   . Hodgkin lymphoma ([place])   . Hypertension     . Past Surgical History:  Procedure Laterality Date  . AXILLARY LYMPH NODE BIOPSY Right 03/19/2016   Procedure: AXILLARY LYMPH NODE BIOPSY;  Surgeon: Armandina Gemma, MD;  Location: [facility];  Service: General;  Laterality: Right;  . IR GENERIC HISTORICAL  12/22/2016   IR FLUORO GUIDE PORT INSERTION LEFT 12/22/2016 [facility]  . IR GENERIC HISTORICAL  12/22/2016   IR US GUIDE VASC ACCESS LEFT 12/22/2016 [facility]  . IR GENERIC HISTORICAL  12/22/2016   IR CV LINE INJECTION 12/22/2016 [facility]  . IR GENERIC HISTORICAL  12/22/2016   IR REMOVAL TUN ACCESS W/ PORT W/O FL MOD SED 12/22/2016 [facility]  . VIDEO BRONCHOSCOPY Bilateral 11/26/2016   Procedure: VIDEO BRONCHOSCOPY WITH FLUORO;  Surgeon: Rigoberto Noel, MD;  Location: [facility] ENDOSCOPY;  Service: Cardiopulmonary;  Laterality: Bilateral;    . Social History   Tobacco Use  . Smoking status: Former Smoker    Packs/day: 0.50    Years: 15.00    Pack years: 7.50    Types: Cigarettes    Quit date: 03/27/2016    Years since quitting: 4.0  . Smokeless tobacco: Never Used  Substance Use Topics  . Alcohol use: Yes    Comment: occasional now  . Drug use: Not on file    ALLERGIES:  has No Known Allergies.  MEDICATIONS:  Current Outpatient Medications  Medication Sig Dispense Refill  . acyclovir (ZOVIRAX) 400 MG tablet Take 1 tablet (400 mg total) by mouth 2 (two) times daily. Start after completion of Valtrex (Patient not taking: Reported on 01/11/2020)  60 tablet 6  . amitriptyline (ELAVIL) 10 MG tablet Take 1 tablet (10 mg total) by mouth at bedtime. (Patient not taking: Reported on 01/11/2020) 20 tablet 0  . amoxicillin-clavulanate (AUGMENTIN) 875-125 MG tablet Take 1 tablet by mouth every 12 (twelve) hours. (Patient not taking: Reported on 01/11/2020) 14 tablet 0  . gabapentin (NEURONTIN) 300 MG capsule Take 1 capsule (300 mg total) by mouth 2 (two) times daily. (Patient not taking: Reported on 01/11/2020) 60 capsule 0  . oxyCODONE-acetaminophen (PERCOCET/ROXICET) 5-325 MG tablet Take 1-2 tablets by mouth every 6 (six) hours as needed for severe pain. (Patient not taking: Reported on 01/11/2020) 50 tablet 0   Current Facility-Administered Medications  Medication Dose Route Frequency Provider Last Rate Last Admin  . sodium chloride flush (NS) 0.9 % injection 10 mL  10 mL Intracatheter PRN Brunetta Genera, MD   10 mL at 04/04/20 1113   Facility-Administered Medications Ordered in Other Visits  Medication Dose Route Frequency Provider Last Rate Last Admin  . sodium chloride flush (NS) 0.9 % injection 10 mL  10 mL Intracatheter PRN Brunetta Genera, MD        PHYSICAL EXAMINATION: ECOG FS:1 - Symptomatic but completely ambulatory  Vitals:   04/04/20 1039  BP: 101/82  Pulse: 81  Resp: 18  Temp: 98.2 F (36.8 C)  SpO2: 100%   Wt Readings from Last 3 Encounters:  04/04/20 152 lb 12.8 oz (69.3 kg)  02/22/20 154 lb 12.8 oz (70.2 kg)  02/01/20 151 lb 12.8 oz (68.9 kg)   Body mass index is 23.93 kg/m.    GENERAL:alert, in no acute distress and comfortable SKIN: no acute rashes, no significant lesions EYES: conjunctiva are pink and non-injected, sclera anicteric OROPHARYNX: MMM, no exudates, no oropharyngeal erythema or ulceration NECK: supple, no JVD LYMPH:  no palpable lymphadenopathy in the cervical, axillary or inguinal regions LUNGS: clear to auscultation b/l with normal respiratory effort HEART: regular rate &  rhythm ABDOMEN:  normoactive bowel sounds , non tender, not distended. Extremity: no pedal edema PSYCH: alert & oriented x 3 with fluent speech NEURO: no focal motor/sensory deficits   LABORATORY DATA:   I have reviewed the data as listed  . CBC Latest Ref Rng & Units 04/04/2020 03/14/2020 02/22/2020  WBC 4.0 - 10.5 K/uL 3.2(L) 3.8(L) 4.9  Hemoglobin 12.0 - 15.0 g/dL 11.5(L) 11.6(L) 11.5(L)  Hematocrit 36.0 - 46.0 % 35.0(L) 34.2(L) 34.5(L)  Platelets 150 - 400 K/uL 161 170 210   . CBC    Component Value Date/Time   WBC 3.2 (L) 04/04/2020 1003   RBC 3.47 (L) 04/04/2020 1003   HGB 11.5 (L) 04/04/2020 1003   HGB 10.6 (L) 11/07/2018 1047   HGB 11.7 07/20/2017 1315   HCT 35.0 (L) 04/04/2020 1003   HCT 35.2 07/20/2017 1315   PLT 161 04/04/2020 1003   PLT 473 (H) 11/07/2018 1047   PLT 266 07/20/2017 1315   MCV 100.9 (H) 04/04/2020 1003   MCV 93.2 07/20/2017 1315   MCH 33.1 04/04/2020 1003   MCHC 32.9 04/04/2020 1003  RDW 11.9 04/04/2020 1003   RDW 14.5 07/20/2017 1315   LYMPHSABS 0.3 (L) 04/04/2020 1003   LYMPHSABS 0.3 (L) 07/20/2017 1315   MONOABS 0.5 04/04/2020 1003   MONOABS 0.5 07/20/2017 1315   EOSABS 0.1 04/04/2020 1003   EOSABS 0.2 07/20/2017 1315   BASOSABS 0.0 04/04/2020 1003   BASOSABS 0.0 07/20/2017 1315   . CMP Latest Ref Rng & Units 04/04/2020 03/14/2020 02/22/2020  Glucose 70 - 99 mg/dL 81 75 83  BUN 6 - 20 mg/dL 9 10 11   Creatinine 0.44 - 1.00 mg/dL 0.82 0.88 0.94  Sodium 135 - 145 mmol/L 139 137 139  Potassium 3.5 - 5.1 mmol/L 4.2 4.3 4.2  Chloride 98 - 111 mmol/L 102 101 103  CO2 22 - 32 mmol/L 30 27 27   Calcium 8.9 - 10.3 mg/dL 8.8(L) 9.3 8.8(L)  Total Protein 6.5 - 8.1 g/dL 6.7 6.9 7.0  Total Bilirubin 0.3 - 1.2 mg/dL 0.5 0.8 0.9  Alkaline Phos 38 - 126 U/L 84 65 59  AST 15 - 41 U/L 134(H) 63(H) 21  ALT 0 - 44 U/L 127(H) 42 20     RADIOGRAPHIC STUDIES: I have personally reviewed the radiological images as listed and agreed with the findings in  the report. No results found.  ASSESSMENT & PLAN:   34 y.o. 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 She was noted to have significant constitutional symptoms including significant weight loss, fevers chills and some night sweats. HIV negative Hepatitis C and hepatitis B serologies negative. Echo with normal ejection fraction. Patient was treated with 5 cycles of AVD (Bleomycin held due to poor DLCO 37% and ongoing smoking). Multiple avoidable treatment delays due to the patient's noncompliance with follow-up for avoidable reasons. She has been counseled repeatedly that this would increase the likelihood of unfavorable outcome.  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 S/p 6 cycles of 2nd line treatment with Bendamustine/Brentuximab And 1 cycle of Bretuximab alone  -PET/CT scan results from 04/14/2017 were discussed in details. She appears to have some persistent disease in her right lung at Deauville 5. It is difficult to say if this is recurrent or persistent disease since the patient failed to follow-up on multiple scheduled PET/CT scans in the early part and prior to her second line treatment.  Patient was lost to followup for >1 yr  09/26/18 PET/CT revealed Imaging findings compatible with recurrence of disease. 2. Multiple new large areas of hypermetabolic nodularity and airspace consolidation within both lungs which is presumed to represent pulmonary involvement by lymphoma. Deauville criteria 5. 3. New hypermetabolic left supraclavicular, left retroperitoneal, and bilateral pelvic lymph nodes. Deauville criteria 5. 4. Multifocal hypermetabolic osseous lesions. Deauville criteria 5. 5. Small volume of ascites, new.   12/06/18 PET/CT revealed Generally  improved appearance with previously mostly Deauville 5 activity now mostly Deauville 4 activity. 2. Layering of much of the airspace opacity in the lungs, although considerable right perihilar airspace opacity remains. The remaining pulmonary opacities are assess this Deauville 5 on the right and over L4 on the left, and were previously Deauville 5 bilaterally. 3. Mildly reduced size and moderately reduced activity in the abdominopelvic lymph nodes which are now dove L4. 4. The previously seen left supraclavicular lymph node seems to have completely resolved (Deauville 0). 5. The skeletal lesions remain at Deauville 4, although in absolute terms have  decreased in SUV compared to previous. 6. No new regions of malignant involvement compared to prior exam. 7. Other imaging findings of potential clinical significance: Low-density blood pool suggests anemia. Pectus excavatum. Volume loss in the right hemithorax.  06/13/19 PET/CT revealed "No abnormal hypermetabolism (Deauville 1). 2. Mixed lytic and sclerotic osseous lesions appear more prominent than on 12/06/2018 but do not have associated hypermetabolism, indicative of interval healing." 01/31/2020 CT ABDOMEN PELVIS W CONTRAST (Accession [id number]) and CT CHEST W CONTRAST (Accession [id number]) revealed "1. Small pre pericardial lymph node, not present on the prior study. 2. Stable volume loss in the right chest with shift of mediastinal structures into the right chest. 3. No change in the appearance of multifocal bony sclerosis, without signs of FDG uptake on the previous exam, attention on follow-up."  #2 s/p hypoxic respiratory failurewith dense right lung consolidation and left upper lobe consolidation with SVC syndrome. Patient has completed palliative radiation to the right lung mass causing SVC compression.  11/22/18 PFT which revealed some improvement in DLCO, however some element of restriction and obstruction is noted   #3 previous h/o SVC  syndrome- right facial and right upper extremity swelling -resolved.  #4 Non compliancewith clinic and treatment followup. Missed 2nd dose of bendamustine with C2. Has missed multiple appointment for her PET/CT and missed appointment at [facility] for consideration of Transplant.  Pt was lost to follow up after July 2018 and returned on 08/31/18 Discussed the patient's goals of care and the pt noted that she is ready to begin treatment again and maintain compliance and follow ups   #5 h/o Grade 1 neuropathyfrom Brentuximab-Vedotin - resolved  #6 Shingles outbreak - curentlyresolved but with significant post herpetic neuralgia  #7 Significant anemia and thrombocytopenia after C1 of ICE -- will need close monitoring.  #8 DVT Prophylaxis - lovenox  #9 Abnormal LFTs ? Related to Pembrolizumab - stable today  PLAN: -Discussed pt labwork today, 04/04/20; of CBC w/diff and CMP is as follows: all values are WNL except for WBC at 3.2K, RBC at 3.47, Hemoglobin at 11.5, HCT at 35, MCV at 100.9, Lymph's Abs at 0.3K, Calcium at 8.8, AST at 134, ALT at 127 -Discussed 04/04/20 of Sedimentation Rate at 10 -Discussed that she is still in remission  -No overt lab, clinical or radiographic evidence of CHL progression at this time. -Advised on liver enzyme increase  -Advised on absolute alcohol cessation -Recommends holding treatment for today, until liver settles down. - hold off on steroids  -Recommends no alcohol consumption for 2-3 weeks -Recommends no Tylenol   FOLLOW UP: -HOlding treatment today. -F/u for next Pembrolizumab treatment as scheduled in 3 weeks with portflush, labs, MD visit (needed to be added) and Pembrolizumab treatment  The total time spent in the appt was 20 minutes and more than 50% was on counseling and direct patient cares.  All of the patient's questions were answered with apparent satisfaction. The patient knows to call the clinic with any problems,  questions or concerns.  Sullivan Lone MD [place] AAHIVMS [facility] Hematology/Oncology Physician [facility]  (Office):       [phone] (Work cell):  [phone] (Fax):           [phone]  I, Dawayne Cirri am acting as a scribe for Dr. Sullivan Lone.   .I have reviewed the above documentation for accuracy and completeness, and I agree with the above. Brunetta Genera MD

## 2020-04-04 ENCOUNTER — Inpatient Hospital Stay: Payer: Medicaid Other

## 2020-04-04 ENCOUNTER — Inpatient Hospital Stay (HOSPITAL_BASED_OUTPATIENT_CLINIC_OR_DEPARTMENT_OTHER): Payer: Medicaid Other | Admitting: Hematology

## 2020-04-04 ENCOUNTER — Other Ambulatory Visit: Payer: Self-pay

## 2020-04-04 ENCOUNTER — Inpatient Hospital Stay: Payer: Medicaid Other | Attending: Hematology

## 2020-04-04 VITALS — BP 101/82 | HR 81 | Temp 98.2°F | Resp 18 | Ht 67.0 in | Wt 152.8 lb

## 2020-04-04 DIAGNOSIS — R945 Abnormal results of liver function studies: Secondary | ICD-10-CM

## 2020-04-04 DIAGNOSIS — R188 Other ascites: Secondary | ICD-10-CM | POA: Insufficient documentation

## 2020-04-04 DIAGNOSIS — C8198 Hodgkin lymphoma, unspecified, lymph nodes of multiple sites: Secondary | ICD-10-CM | POA: Diagnosis not present

## 2020-04-04 DIAGNOSIS — C8128 Mixed cellularity classical Hodgkin lymphoma, lymph nodes of multiple sites: Secondary | ICD-10-CM | POA: Diagnosis present

## 2020-04-04 DIAGNOSIS — Z7189 Other specified counseling: Secondary | ICD-10-CM

## 2020-04-04 DIAGNOSIS — Z9119 Patient's noncompliance with other medical treatment and regimen: Secondary | ICD-10-CM | POA: Diagnosis not present

## 2020-04-04 DIAGNOSIS — Z9114 Patient's other noncompliance with medication regimen: Secondary | ICD-10-CM | POA: Diagnosis not present

## 2020-04-04 DIAGNOSIS — G35 Multiple sclerosis: Secondary | ICD-10-CM | POA: Insufficient documentation

## 2020-04-04 DIAGNOSIS — Z86718 Personal history of other venous thrombosis and embolism: Secondary | ICD-10-CM | POA: Diagnosis not present

## 2020-04-04 DIAGNOSIS — R6883 Chills (without fever): Secondary | ICD-10-CM | POA: Insufficient documentation

## 2020-04-04 DIAGNOSIS — R61 Generalized hyperhidrosis: Secondary | ICD-10-CM | POA: Diagnosis not present

## 2020-04-04 DIAGNOSIS — D696 Thrombocytopenia, unspecified: Secondary | ICD-10-CM | POA: Insufficient documentation

## 2020-04-04 DIAGNOSIS — Z87891 Personal history of nicotine dependence: Secondary | ICD-10-CM | POA: Diagnosis not present

## 2020-04-04 DIAGNOSIS — Z5112 Encounter for antineoplastic immunotherapy: Secondary | ICD-10-CM

## 2020-04-04 DIAGNOSIS — Z95828 Presence of other vascular implants and grafts: Secondary | ICD-10-CM | POA: Diagnosis not present

## 2020-04-04 DIAGNOSIS — Q676 Pectus excavatum: Secondary | ICD-10-CM | POA: Diagnosis not present

## 2020-04-04 DIAGNOSIS — R634 Abnormal weight loss: Secondary | ICD-10-CM | POA: Insufficient documentation

## 2020-04-04 DIAGNOSIS — Z79899 Other long term (current) drug therapy: Secondary | ICD-10-CM | POA: Diagnosis not present

## 2020-04-04 DIAGNOSIS — R918 Other nonspecific abnormal finding of lung field: Secondary | ICD-10-CM | POA: Diagnosis not present

## 2020-04-04 DIAGNOSIS — D649 Anemia, unspecified: Secondary | ICD-10-CM | POA: Diagnosis not present

## 2020-04-04 DIAGNOSIS — C8120 Mixed cellularity classical Hodgkin lymphoma, unspecified site: Secondary | ICD-10-CM | POA: Insufficient documentation

## 2020-04-04 DIAGNOSIS — R7989 Other specified abnormal findings of blood chemistry: Secondary | ICD-10-CM

## 2020-04-04 LAB — CBC WITH DIFFERENTIAL/PLATELET
Abs Immature Granulocytes: 0.01 10*3/uL (ref 0.00–0.07)
Basophils Absolute: 0 10*3/uL (ref 0.0–0.1)
Basophils Relative: 0 %
Eosinophils Absolute: 0.1 10*3/uL (ref 0.0–0.5)
Eosinophils Relative: 4 %
HCT: 35 % — ABNORMAL LOW (ref 36.0–46.0)
Hemoglobin: 11.5 g/dL — ABNORMAL LOW (ref 12.0–15.0)
Immature Granulocytes: 0 %
Lymphocytes Relative: 9 %
Lymphs Abs: 0.3 10*3/uL — ABNORMAL LOW (ref 0.7–4.0)
MCH: 33.1 pg (ref 26.0–34.0)
MCHC: 32.9 g/dL (ref 30.0–36.0)
MCV: 100.9 fL — ABNORMAL HIGH (ref 80.0–100.0)
Monocytes Absolute: 0.5 10*3/uL (ref 0.1–1.0)
Monocytes Relative: 15 %
Neutro Abs: 2.3 10*3/uL (ref 1.7–7.7)
Neutrophils Relative %: 72 %
Platelets: 161 10*3/uL (ref 150–400)
RBC: 3.47 MIL/uL — ABNORMAL LOW (ref 3.87–5.11)
RDW: 11.9 % (ref 11.5–15.5)
WBC: 3.2 10*3/uL — ABNORMAL LOW (ref 4.0–10.5)
nRBC: 0 % (ref 0.0–0.2)

## 2020-04-04 LAB — CMP (CANCER CENTER ONLY)
ALT: 127 U/L — ABNORMAL HIGH (ref 0–44)
AST: 134 U/L — ABNORMAL HIGH (ref 15–41)
Albumin: 3.7 g/dL (ref 3.5–5.0)
Alkaline Phosphatase: 84 U/L (ref 38–126)
Anion gap: 7 (ref 5–15)
BUN: 9 mg/dL (ref 6–20)
CO2: 30 mmol/L (ref 22–32)
Calcium: 8.8 mg/dL — ABNORMAL LOW (ref 8.9–10.3)
Chloride: 102 mmol/L (ref 98–111)
Creatinine: 0.82 mg/dL (ref 0.44–1.00)
GFR, Est AFR Am: >60 mL/min (ref >60)
GFR, Estimated: >60 mL/min (ref >60)
Glucose, Bld: 81 mg/dL (ref 70–99)
Potassium: 4.2 mmol/L (ref 3.5–5.1)
Sodium: 139 mmol/L (ref 135–145)
Total Bilirubin: 0.5 mg/dL (ref 0.3–1.2)
Total Protein: 6.7 g/dL (ref 6.5–8.1)

## 2020-04-04 LAB — SEDIMENTATION RATE: Sed Rate: 10 mm/hr (ref 0–22)

## 2020-04-04 MED ORDER — SODIUM CHLORIDE 0.9% FLUSH
10.0000 mL | INTRAVENOUS | Status: DC | PRN
  Administered 2020-04-04: 11:00:00 10 mL
  Filled 2020-04-04: qty 10

## 2020-04-04 MED ORDER — SODIUM CHLORIDE 0.9% FLUSH
10.0000 mL | INTRAVENOUS | Status: DC | PRN
  Administered 2020-04-04: 10 mL
  Filled 2020-04-04: qty 10

## 2020-04-04 MED ORDER — HEPARIN SOD (PORK) LOCK FLUSH 100 UNIT/ML IV SOLN
500.0000 [IU] | Freq: Once | INTRAVENOUS | Status: AC | PRN
  Administered 2020-04-04: 500 [IU]
  Filled 2020-04-04: qty 5

## 2020-04-21 NOTE — Progress Notes (Signed)
Pharmacist Chemotherapy Monitoring - Follow Up Assessment    I verify that I have reviewed each item in the below checklist:  . Regimen for the patient is scheduled for the appropriate day and plan matches scheduled date. [name] Kitchen Appropriate non-routine labs are ordered dependent on drug ordered. . If applicable, additional medications reviewed and ordered per protocol based on lifetime cumulative doses and/or treatment regimen.   Plan for follow-up and/or issues identified: No . I-vent associated with next due treatment: No . MD and/or nursing notified: No  Romualdo Bolk [facility] 04/21/2020 1:55 PM

## 2020-04-24 NOTE — Progress Notes (Signed)
HEMATOLOGY ONCOLOGY PROGRESS NOTE  Date of service:   04/25/20     Patient Care Team: Patient, No Pcp Per as PCP - General (General Practice)  Chief complaint: Follow-up for Hodgkin's lymphoma  Diagnosis:   Refractory Mixed cellularity Hodgkin's lymphoma IVBE with extensive lymphadenopathy including right axillary, mediastinal and upper retroperitoneal and now biopsy proven pulmonary involvement. She was noted to have significant constitutional symptoms including significant weight loss, fevers chills and some night sweats.  Current Treatment:  Pembrolizumab- 4th line. Patient declined HDT/Auto HSCT or consideration of Car-T cell therapy.  Previous treatment  5 cycles of AVD (without bleomycin due to lung involvement and DLCO of 37%, active smoker) Multiple avoidable treatment delays due to the patient's noncompliance with follow-up for avoidable reasons. She has been counseled repeatedly that this would increase the likelihood of unfavorable outcome.  2nd line therapy with Bendamustine + Brentuximab s/p 7 cycles.  3rd line therapy with ICE s/p 2 cycles   INTERVAL HISTORY:  [name] is here for follow-up for her Hodgkins lymphoma for C20D1 Pembrolizumab treatment. The patient's last visit with [place] was on 04/04/20. The pt reports that she is doing well overall.  The pt reports she is good. She is eating well, sleeping well, and spending time walking outdoors.   Lab results today (04/25/20) of CBC w/diff and CMP is as follows: all values are WNL except for WBC at 3.7K, RBC at 3.66, Lymph's Abs at 0.3K, Glucose at 104, AST at 79, ALT at 74 04/25/20 of Sedimentation Rate at 4  On review of systems, pt reports healthy appetite, sleeping well, exercising regularly and denies abdominal pain, irregular bowl habits, rashes, diarrhea and any other symptoms.   REVIEW OF SYSTEMS:    A 10+ POINT REVIEW OF SYSTEMS WAS OBTAINED including neurology, dermatology, psychiatry, cardiac,  respiratory, lymph, extremities, GI, GU, Musculoskeletal, constitutional, breasts, reproductive, HEENT.  All pertinent positives are noted in the HPI.  All others are negative.  . Past Medical History:  Diagnosis Date  . ARDS (adult respiratory distress syndrome) ([place])   . Asthma   . Hodgkin lymphoma ([place])   . Hypertension     . Past Surgical History:  Procedure Laterality Date  . AXILLARY LYMPH NODE BIOPSY Right 03/19/2016   Procedure: AXILLARY LYMPH NODE BIOPSY;  Surgeon: Armandina Gemma, MD;  Location: [facility];  Service: General;  Laterality: Right;  . IR GENERIC HISTORICAL  12/22/2016   IR FLUORO GUIDE PORT INSERTION LEFT 12/22/2016 [facility]  . IR GENERIC HISTORICAL  12/22/2016   IR US GUIDE VASC ACCESS LEFT 12/22/2016 [facility]  . IR GENERIC HISTORICAL  12/22/2016   IR CV LINE INJECTION 12/22/2016 [facility]  . IR GENERIC HISTORICAL  12/22/2016   IR REMOVAL TUN ACCESS W/ PORT W/O FL MOD SED 12/22/2016 [facility]  . VIDEO BRONCHOSCOPY Bilateral 11/26/2016   Procedure: VIDEO BRONCHOSCOPY WITH FLUORO;  Surgeon: Rigoberto Noel, MD;  Location: [facility];  Service: Cardiopulmonary;  Laterality: Bilateral;    . Social History   Tobacco Use  . Smoking status: Former Smoker    Packs/day: 0.50    Years: 15.00    Pack years: 7.50    Types: Cigarettes    Quit date: 03/27/2016    Years since quitting: 4.0  . Smokeless tobacco: Never Used  Substance Use Topics  . Alcohol use: Yes    Comment: occasional now  . Drug use: Not on file    ALLERGIES:  has No Known Allergies.  MEDICATIONS:  Current Outpatient Medications  Medication Sig Dispense Refill  . acyclovir (ZOVIRAX) 400 MG tablet Take 1 tablet (400 mg total) by mouth 2 (two) times daily. Start after completion of Valtrex (Patient not taking: Reported on 01/11/2020) 60 tablet 6  . amitriptyline (ELAVIL) 10 MG tablet Take 1 tablet (10 mg total) by mouth at bedtime. (Patient not taking: Reported on 01/11/2020) 20 tablet  0  . amoxicillin-clavulanate (AUGMENTIN) 875-125 MG tablet Take 1 tablet by mouth every 12 (twelve) hours. (Patient not taking: Reported on 01/11/2020) 14 tablet 0  . gabapentin (NEURONTIN) 300 MG capsule Take 1 capsule (300 mg total) by mouth 2 (two) times daily. (Patient not taking: Reported on 01/11/2020) 60 capsule 0  . oxyCODONE-acetaminophen (PERCOCET/ROXICET) 5-325 MG tablet Take 1-2 tablets by mouth every 6 (six) hours as needed for severe pain. (Patient not taking: Reported on 01/11/2020) 50 tablet 0   No current facility-administered medications for this visit.   Facility-Administered Medications Ordered in Other Visits  Medication Dose Route Frequency Provider Last Rate Last Admin  . sodium chloride flush (NS) 0.9 % injection 10 mL  10 mL Intracatheter PRN Brunetta Genera, MD      . sodium chloride flush (NS) 0.9 % injection 10 mL  10 mL Intracatheter PRN Brunetta Genera, MD   10 mL at 04/25/20 1143    PHYSICAL EXAMINATION: ECOG FS:1 - Symptomatic but completely ambulatory  Vitals:   04/25/20 0919  BP: (!) 138/97  Pulse: 79  Resp: 18  Temp: 98.2 F (36.8 C)  SpO2: 97%   Wt Readings from Last 3 Encounters:  04/25/20 150 lb 3.2 oz (68.1 kg)  04/04/20 152 lb 12.8 oz (69.3 kg)  02/22/20 154 lb 12.8 oz (70.2 kg)   Body mass index is 23.52 kg/m.    GENERAL:alert, in no acute distress and comfortable SKIN: no acute rashes, no significant lesions EYES: conjunctiva are pink and non-injected, sclera anicteric OROPHARYNX: MMM, no exudates, no oropharyngeal erythema or ulceration NECK: supple, no JVD LYMPH:  no palpable lymphadenopathy in the cervical, axillary or inguinal regions LUNGS: clear to auscultation b/l with normal respiratory effort HEART: regular rate & rhythm ABDOMEN:  normoactive bowel sounds , non tender, not distended. Extremity: no pedal edema PSYCH: alert & oriented x 3 with fluent speech NEURO: no focal motor/sensory deficits  LABORATORY DATA:    I have reviewed the data as listed  . CBC Latest Ref Rng & Units 04/25/2020 04/04/2020 03/14/2020  WBC 4.0 - 10.5 K/uL 3.7(L) 3.2(L) 3.8(L)  Hemoglobin 12.0 - 15.0 g/dL 12.2 11.5(L) 11.6(L)  Hematocrit 36.0 - 46.0 % 36.2 35.0(L) 34.2(L)  Platelets 150 - 400 K/uL 174 161 170   . CBC    Component Value Date/Time   WBC 3.7 (L) 04/25/2020 0850   RBC 3.66 (L) 04/25/2020 0850   HGB 12.2 04/25/2020 0850   HGB 10.6 (L) 11/07/2018 1047   HGB 11.7 07/20/2017 1315   HCT 36.2 04/25/2020 0850   HCT 35.2 07/20/2017 1315   PLT 174 04/25/2020 0850   PLT 473 (H) 11/07/2018 1047   PLT 266 07/20/2017 1315   MCV 98.9 04/25/2020 0850   MCV 93.2 07/20/2017 1315   MCH 33.3 04/25/2020 0850   MCHC 33.7 04/25/2020 0850   RDW 11.8 04/25/2020 0850   RDW 14.5 07/20/2017 1315   LYMPHSABS 0.3 (L) 04/25/2020 0850   LYMPHSABS 0.3 (L) 07/20/2017 1315   MONOABS 0.5 04/25/2020 0850   MONOABS 0.5 07/20/2017 1315   EOSABS 0.1  04/25/2020 0850   EOSABS 0.2 07/20/2017 1315   BASOSABS 0.0 04/25/2020 0850   BASOSABS 0.0 07/20/2017 1315   . CMP Latest Ref Rng & Units 04/25/2020 04/04/2020 03/14/2020  Glucose 70 - 99 mg/dL 104(H) 81 75  BUN 6 - 20 mg/dL 9 9 10   Creatinine 0.44 - 1.00 mg/dL 0.84 0.82 0.88  Sodium 135 - 145 mmol/L 140 139 137  Potassium 3.5 - 5.1 mmol/L 4.1 4.2 4.3  Chloride 98 - 111 mmol/L 101 102 101  CO2 22 - 32 mmol/L 28 30 27   Calcium 8.9 - 10.3 mg/dL 9.0 8.8(L) 9.3  Total Protein 6.5 - 8.1 g/dL 7.2 6.7 6.9  Total Bilirubin 0.3 - 1.2 mg/dL 0.9 0.5 0.8  Alkaline Phos 38 - 126 U/L 79 84 65  AST 15 - 41 U/L 79(H) 134(H) 63(H)  ALT 0 - 44 U/L 74(H) 127(H) 42     RADIOGRAPHIC STUDIES: I have personally reviewed the radiological images as listed and agreed with the findings in the report. No results found.  ASSESSMENT & PLAN:   34 y.o. African-American female with  #1 Refractory/Progressive Mixed cellularity Hodgkin's lymphoma IV BEwith extensive lymphadenopathy including right  axillary, mediastinal and upper retroperitoneal and now biopsy proven pulmonary involvement. She was noted to have significant constitutional symptoms including significant weight loss, fevers chills and some night sweats. HIV negative Hepatitis C and hepatitis B serologies negative. Echo with normal ejection fraction. Patient was treated with 5 cycles of AVD (Bleomycin held due to poor DLCO 37% and ongoing smoking). Multiple avoidable treatment delays due to the patient's noncompliance with follow-up for avoidable reasons. She has been counseled repeatedly that this would increase the likelihood of unfavorable outcome.  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 S/p 6 cycles of 2nd line treatment with Bendamustine/Brentuximab And 1 cycle of Bretuximab alone  -PET/CT scan results from 04/14/2017 were discussed in details. She appears to have some persistent disease in her right lung at Deauville 5. It is difficult to say if this is recurrent or persistent disease since the patient failed to follow-up on multiple scheduled PET/CT scans in the early part and prior to her second line treatment.  Patient was lost to followup for >1 yr  09/26/18 PET/CT revealed Imaging findings compatible with recurrence of disease. 2. Multiple new large areas of hypermetabolic nodularity and airspace consolidation within both lungs which is presumed to represent pulmonary involvement by lymphoma. Deauville criteria 5. 3. New hypermetabolic left supraclavicular, left retroperitoneal, and bilateral pelvic lymph nodes. Deauville criteria 5. 4. Multifocal hypermetabolic osseous lesions. Deauville criteria 5. 5. Small volume of ascites, new.   12/06/18 PET/CT revealed Generally improved appearance with previously mostly Deauville 5 activity now mostly Deauville 4 activity. 2. Layering of much of the airspace opacity in the lungs, although considerable right perihilar airspace opacity  remains. The remaining pulmonary opacities are assess this Deauville 5 on the right and over L4 on the left, and were previously Deauville 5 bilaterally. 3. Mildly reduced size and moderately reduced activity in the abdominopelvic lymph nodes which are now dove L4. 4. The previously seen left supraclavicular lymph node seems to have completely resolved (Deauville 0). 5. The skeletal lesions remain at Deauville 4, although in absolute terms have decreased in SUV compared to previous. 6. No new regions of malignant involvement compared to prior exam. 7. Other imaging findings of potential clinical significance: Low-density blood pool suggests anemia. Pectus excavatum. Volume loss in the right hemithorax.  06/13/19  PET/CT revealed "No abnormal hypermetabolism (Deauville 1). 2. Mixed lytic and sclerotic osseous lesions appear more prominent than on 12/06/2018 but do not have associated hypermetabolism, indicative of interval healing." 01/31/2020 CT ABDOMEN PELVIS W CONTRAST (Accession [id number]) and CT CHEST W CONTRAST (Accession [id number]) revealed "1. Small pre pericardial lymph node, not present on the prior study. 2. Stable volume loss in the right chest with shift of mediastinal structures into the right chest. 3. No change in the appearance of multifocal bony sclerosis, without signs of FDG uptake on the previous exam, attention on follow-up."  #2 s/p hypoxic respiratory failurewith dense right lung consolidation and left upper lobe consolidation with SVC syndrome. Patient has completed palliative radiation to the right lung mass causing SVC compression.  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  #4 Non compliancewith clinic and treatment followup. Missed 2nd dose of bendamustine with C2. Has missed multiple appointment for her  PET/CT and missed appointment at [facility] for consideration of Transplant.  Pt was lost to follow up after July 2018 and returned on 08/31/18 Discussed the patient's goals of care and the pt noted that she is ready to begin treatment again and maintain compliance and follow ups   #5 h/o Grade 1 neuropathyfrom Brentuximab-Vedotin - resolved  #6 Shingles outbreak - curentlyresolved but with significant post herpetic neuralgia  #7 Significant anemia and thrombocytopenia after C1 of ICE -- will need close monitoring.  #8 DVT Prophylaxis - lovenox  #9 Abnormal LFTs ? Related to Pembrolizumab - stable today  PLAN: -Discussed pt labwork today, 04/25/20;  of CBC w/diff and CMP is as follows: all values are WNL except for WBC at 3.7K, RBC at 3.66, Lymph's Abs at 0.3K, Glucose at 104, AST at 79, ALT at 74 -Discussed 04/25/20 of Sedimentation Rate at 4 -No overt lab, clinical or radiographic evidence of CHL progression at this time. -Advised on absolute alcohol cessation  -Will see back in 6 weeks   FOLLOW UP: -Continue Pembrolizumab, portflush and labs every 3 weeks - plz schedule next 4 treatments - MD visit with every other treatment every 6 weeks.  The total time spent in the appt was 20 minutes and more than 50% was on counseling and direct patient cares.  All of the patient's questions were answered with apparent satisfaction. The patient knows to call the clinic with any problems, questions or concerns.  Sullivan Lone MD [place] AAHIVMS [facility] Hematology/Oncology Physician [facility]  (Office):       [phone] (Work cell):  [phone] (Fax):           [phone]  I, Dawayne Cirri am acting as a scribe for Dr. Sullivan Lone.   .I have reviewed the above documentation for accuracy and completeness, and I agree with the above. Brunetta Genera MD

## 2020-04-25 ENCOUNTER — Other Ambulatory Visit: Payer: Medicaid Other

## 2020-04-25 ENCOUNTER — Other Ambulatory Visit: Payer: Self-pay

## 2020-04-25 ENCOUNTER — Inpatient Hospital Stay: Payer: Medicaid Other

## 2020-04-25 ENCOUNTER — Telehealth: Payer: Self-pay | Admitting: Hematology

## 2020-04-25 ENCOUNTER — Inpatient Hospital Stay (HOSPITAL_BASED_OUTPATIENT_CLINIC_OR_DEPARTMENT_OTHER): Payer: Medicaid Other | Admitting: Hematology

## 2020-04-25 VITALS — BP 138/97 | HR 79 | Temp 98.2°F | Resp 18 | Ht 67.0 in | Wt 150.2 lb

## 2020-04-25 DIAGNOSIS — R945 Abnormal results of liver function studies: Secondary | ICD-10-CM | POA: Diagnosis not present

## 2020-04-25 DIAGNOSIS — C8198 Hodgkin lymphoma, unspecified, lymph nodes of multiple sites: Secondary | ICD-10-CM

## 2020-04-25 DIAGNOSIS — Z7189 Other specified counseling: Secondary | ICD-10-CM

## 2020-04-25 DIAGNOSIS — Z5112 Encounter for antineoplastic immunotherapy: Secondary | ICD-10-CM | POA: Diagnosis not present

## 2020-04-25 DIAGNOSIS — R7989 Other specified abnormal findings of blood chemistry: Secondary | ICD-10-CM

## 2020-04-25 DIAGNOSIS — Z95828 Presence of other vascular implants and grafts: Secondary | ICD-10-CM

## 2020-04-25 DIAGNOSIS — C8120 Mixed cellularity classical Hodgkin lymphoma, unspecified site: Secondary | ICD-10-CM | POA: Diagnosis not present

## 2020-04-25 LAB — CMP (CANCER CENTER ONLY)
ALT: 74 U/L — ABNORMAL HIGH (ref 0–44)
AST: 79 U/L — ABNORMAL HIGH (ref 15–41)
Albumin: 4.2 g/dL (ref 3.5–5.0)
Alkaline Phosphatase: 79 U/L (ref 38–126)
Anion gap: 11 (ref 5–15)
BUN: 9 mg/dL (ref 6–20)
CO2: 28 mmol/L (ref 22–32)
Calcium: 9 mg/dL (ref 8.9–10.3)
Chloride: 101 mmol/L (ref 98–111)
Creatinine: 0.84 mg/dL (ref 0.44–1.00)
GFR, Est AFR Am: >60 mL/min (ref >60)
GFR, Estimated: >60 mL/min (ref >60)
Glucose, Bld: 104 mg/dL — ABNORMAL HIGH (ref 70–99)
Potassium: 4.1 mmol/L (ref 3.5–5.1)
Sodium: 140 mmol/L (ref 135–145)
Total Bilirubin: 0.9 mg/dL (ref 0.3–1.2)
Total Protein: 7.2 g/dL (ref 6.5–8.1)

## 2020-04-25 LAB — CBC WITH DIFFERENTIAL/PLATELET
Abs Immature Granulocytes: 0.01 10*3/uL (ref 0.00–0.07)
Basophils Absolute: 0 10*3/uL (ref 0.0–0.1)
Basophils Relative: 0 %
Eosinophils Absolute: 0.1 10*3/uL (ref 0.0–0.5)
Eosinophils Relative: 2 %
HCT: 36.2 % (ref 36.0–46.0)
Hemoglobin: 12.2 g/dL (ref 12.0–15.0)
Immature Granulocytes: 0 %
Lymphocytes Relative: 9 %
Lymphs Abs: 0.3 10*3/uL — ABNORMAL LOW (ref 0.7–4.0)
MCH: 33.3 pg (ref 26.0–34.0)
MCHC: 33.7 g/dL (ref 30.0–36.0)
MCV: 98.9 fL (ref 80.0–100.0)
Monocytes Absolute: 0.5 10*3/uL (ref 0.1–1.0)
Monocytes Relative: 15 %
Neutro Abs: 2.7 10*3/uL (ref 1.7–7.7)
Neutrophils Relative %: 74 %
Platelets: 174 10*3/uL (ref 150–400)
RBC: 3.66 MIL/uL — ABNORMAL LOW (ref 3.87–5.11)
RDW: 11.8 % (ref 11.5–15.5)
WBC: 3.7 10*3/uL — ABNORMAL LOW (ref 4.0–10.5)
nRBC: 0 % (ref 0.0–0.2)

## 2020-04-25 LAB — SEDIMENTATION RATE: Sed Rate: 4 mm/hr (ref 0–22)

## 2020-04-25 MED ORDER — ACETAMINOPHEN 325 MG PO TABS
ORAL_TABLET | ORAL | Status: AC
  Filled 2020-04-25: qty 2

## 2020-04-25 MED ORDER — SODIUM CHLORIDE 0.9% FLUSH
10.0000 mL | INTRAVENOUS | Status: DC | PRN
  Administered 2020-04-25: 10 mL
  Filled 2020-04-25: qty 10

## 2020-04-25 MED ORDER — ACETAMINOPHEN 325 MG PO TABS
650.0000 mg | ORAL_TABLET | Freq: Once | ORAL | Status: AC
  Administered 2020-04-25: 650 mg via ORAL

## 2020-04-25 MED ORDER — SODIUM CHLORIDE 0.9 % IV SOLN
Freq: Once | INTRAVENOUS | Status: AC
  Filled 2020-04-25: qty 250

## 2020-04-25 MED ORDER — FAMOTIDINE 20 MG PO TABS
40.0000 mg | ORAL_TABLET | Freq: Once | ORAL | Status: AC
  Administered 2020-04-25: 40 mg via ORAL

## 2020-04-25 MED ORDER — DIPHENHYDRAMINE HCL 25 MG PO TABS
25.0000 mg | ORAL_TABLET | Freq: Once | ORAL | Status: AC
  Administered 2020-04-25: 25 mg via ORAL
  Filled 2020-04-25: qty 1

## 2020-04-25 MED ORDER — DIPHENHYDRAMINE HCL 25 MG PO CAPS
ORAL_CAPSULE | ORAL | Status: AC
  Filled 2020-04-25: qty 1

## 2020-04-25 MED ORDER — SODIUM CHLORIDE 0.9 % IV SOLN
200.0000 mg | Freq: Once | INTRAVENOUS | Status: AC
  Administered 2020-04-25: 200 mg via INTRAVENOUS
  Filled 2020-04-25: qty 8

## 2020-04-25 MED ORDER — HEPARIN SOD (PORK) LOCK FLUSH 100 UNIT/ML IV SOLN
500.0000 [IU] | Freq: Once | INTRAVENOUS | Status: AC | PRN
  Administered 2020-04-25: 12:00:00 500 [IU]
  Filled 2020-04-25: qty 5

## 2020-04-25 MED ORDER — FAMOTIDINE 20 MG PO TABS
ORAL_TABLET | ORAL | Status: AC
  Filled 2020-04-25: qty 2

## 2020-04-25 NOTE — Telephone Encounter (Signed)
Scheduled appt per 4/30 los.  Spoke with pt and she is aware of the appt date and time

## 2020-04-25 NOTE — Patient Instructions (Signed)
[facility] Discharge Instructions for Patients Receiving Chemotherapy  Today you received the following chemotherapy agents: pembrolizumab.  To help prevent nausea and vomiting after your treatment, we encourage you to take your nausea medication as directed.   If you develop nausea and vomiting that is not controlled by your nausea medication, call the clinic.   BELOW ARE SYMPTOMS THAT SHOULD BE REPORTED IMMEDIATELY:  *FEVER GREATER THAN 100.5 F  *CHILLS WITH OR WITHOUT FEVER  NAUSEA AND VOMITING THAT IS NOT CONTROLLED WITH YOUR NAUSEA MEDICATION  *UNUSUAL SHORTNESS OF BREATH  *UNUSUAL BRUISING OR BLEEDING  TENDERNESS IN MOUTH AND THROAT WITH OR WITHOUT PRESENCE OF ULCERS  *URINARY PROBLEMS  *BOWEL PROBLEMS  UNUSUAL RASH Items with * indicate a potential emergency and should be followed up as soon as possible.  Feel free to call the clinic should you have any questions or concerns. The clinic phone number is [phone].  Please show the CHEMO ALERT CARD at check-in to the Emergency Department and triage nurse.   

## 2020-04-25 NOTE — Patient Instructions (Signed)

## 2020-05-12 NOTE — Progress Notes (Signed)
Pharmacist Chemotherapy Monitoring - Follow Up Assessment    I verify that I have reviewed each item in the below checklist:  . Regimen for the patient is scheduled for the appropriate day and plan matches scheduled date. [name] Kitchen Appropriate non-routine labs are ordered dependent on drug ordered. . If applicable, additional medications reviewed and ordered per protocol based on lifetime cumulative doses and/or treatment regimen.   Plan for follow-up and/or issues identified: No . I-vent associated with next due treatment: No . MD and/or nursing notified: No  Philomena Course 05/12/2020 11:03 AM

## 2020-05-16 ENCOUNTER — Other Ambulatory Visit: Payer: Self-pay

## 2020-05-16 ENCOUNTER — Inpatient Hospital Stay: Payer: Medicaid Other

## 2020-05-16 ENCOUNTER — Other Ambulatory Visit: Payer: Medicaid Other

## 2020-05-16 ENCOUNTER — Ambulatory Visit: Payer: Medicaid Other | Admitting: Hematology

## 2020-05-16 ENCOUNTER — Inpatient Hospital Stay: Payer: Medicaid Other | Attending: Hematology

## 2020-05-16 VITALS — BP 114/65 | HR 78 | Temp 98.1°F | Resp 18

## 2020-05-16 DIAGNOSIS — G629 Polyneuropathy, unspecified: Secondary | ICD-10-CM | POA: Diagnosis not present

## 2020-05-16 DIAGNOSIS — Z5112 Encounter for antineoplastic immunotherapy: Secondary | ICD-10-CM | POA: Insufficient documentation

## 2020-05-16 DIAGNOSIS — D649 Anemia, unspecified: Secondary | ICD-10-CM | POA: Diagnosis not present

## 2020-05-16 DIAGNOSIS — D696 Thrombocytopenia, unspecified: Secondary | ICD-10-CM | POA: Insufficient documentation

## 2020-05-16 DIAGNOSIS — C8198 Hodgkin lymphoma, unspecified, lymph nodes of multiple sites: Secondary | ICD-10-CM

## 2020-05-16 DIAGNOSIS — Z7901 Long term (current) use of anticoagulants: Secondary | ICD-10-CM | POA: Diagnosis not present

## 2020-05-16 DIAGNOSIS — C8128 Mixed cellularity classical Hodgkin lymphoma, lymph nodes of multiple sites: Secondary | ICD-10-CM | POA: Insufficient documentation

## 2020-05-16 DIAGNOSIS — Z79899 Other long term (current) drug therapy: Secondary | ICD-10-CM | POA: Insufficient documentation

## 2020-05-16 DIAGNOSIS — Z87891 Personal history of nicotine dependence: Secondary | ICD-10-CM | POA: Diagnosis not present

## 2020-05-16 DIAGNOSIS — Z7189 Other specified counseling: Secondary | ICD-10-CM

## 2020-05-16 DIAGNOSIS — Z86718 Personal history of other venous thrombosis and embolism: Secondary | ICD-10-CM | POA: Diagnosis not present

## 2020-05-16 DIAGNOSIS — Z95828 Presence of other vascular implants and grafts: Secondary | ICD-10-CM

## 2020-05-16 DIAGNOSIS — J9690 Respiratory failure, unspecified, unspecified whether with hypoxia or hypercapnia: Secondary | ICD-10-CM | POA: Insufficient documentation

## 2020-05-16 LAB — CBC WITH DIFFERENTIAL/PLATELET
Abs Immature Granulocytes: 0.01 10*3/uL (ref 0.00–0.07)
Basophils Absolute: 0 10*3/uL (ref 0.0–0.1)
Basophils Relative: 0 %
Eosinophils Absolute: 0.1 10*3/uL (ref 0.0–0.5)
Eosinophils Relative: 2 %
HCT: 35.2 % — ABNORMAL LOW (ref 36.0–46.0)
Hemoglobin: 11.9 g/dL — ABNORMAL LOW (ref 12.0–15.0)
Immature Granulocytes: 0 %
Lymphocytes Relative: 6 %
Lymphs Abs: 0.3 10*3/uL — ABNORMAL LOW (ref 0.7–4.0)
MCH: 33.5 pg (ref 26.0–34.0)
MCHC: 33.8 g/dL (ref 30.0–36.0)
MCV: 99.2 fL (ref 80.0–100.0)
Monocytes Absolute: 0.5 10*3/uL (ref 0.1–1.0)
Monocytes Relative: 12 %
Neutro Abs: 3.7 10*3/uL (ref 1.7–7.7)
Neutrophils Relative %: 80 %
Platelets: 185 10*3/uL (ref 150–400)
RBC: 3.55 MIL/uL — ABNORMAL LOW (ref 3.87–5.11)
RDW: 12.2 % (ref 11.5–15.5)
WBC: 4.5 10*3/uL (ref 4.0–10.5)
nRBC: 0 % (ref 0.0–0.2)

## 2020-05-16 LAB — CMP (CANCER CENTER ONLY)
ALT: 41 U/L (ref 0–44)
AST: 39 U/L (ref 15–41)
Albumin: 4.1 g/dL (ref 3.5–5.0)
Alkaline Phosphatase: 66 U/L (ref 38–126)
Anion gap: 11 (ref 5–15)
BUN: 10 mg/dL (ref 6–20)
CO2: 25 mmol/L (ref 22–32)
Calcium: 9.1 mg/dL (ref 8.9–10.3)
Chloride: 103 mmol/L (ref 98–111)
Creatinine: 0.9 mg/dL (ref 0.44–1.00)
GFR, Est AFR Am: >60 mL/min (ref >60)
GFR, Estimated: >60 mL/min (ref >60)
Glucose, Bld: 65 mg/dL — ABNORMAL LOW (ref 70–99)
Potassium: 4 mmol/L (ref 3.5–5.1)
Sodium: 139 mmol/L (ref 135–145)
Total Bilirubin: 0.5 mg/dL (ref 0.3–1.2)
Total Protein: 7.3 g/dL (ref 6.5–8.1)

## 2020-05-16 LAB — SEDIMENTATION RATE: Sed Rate: 6 mm/hr (ref 0–22)

## 2020-05-16 LAB — TSH: TSH: 0.96 u[IU]/mL (ref 0.308–3.960)

## 2020-05-16 MED ORDER — HEPARIN SOD (PORK) LOCK FLUSH 100 UNIT/ML IV SOLN
500.0000 [IU] | Freq: Once | INTRAVENOUS | Status: AC | PRN
  Administered 2020-05-16: 500 [IU]
  Filled 2020-05-16: qty 5

## 2020-05-16 MED ORDER — DIPHENHYDRAMINE HCL 25 MG PO CAPS
ORAL_CAPSULE | ORAL | Status: AC
  Filled 2020-05-16: qty 1

## 2020-05-16 MED ORDER — FAMOTIDINE 20 MG PO TABS
ORAL_TABLET | ORAL | Status: AC
  Filled 2020-05-16: qty 2

## 2020-05-16 MED ORDER — ACETAMINOPHEN 325 MG PO TABS
650.0000 mg | ORAL_TABLET | Freq: Once | ORAL | Status: AC
  Administered 2020-05-16: 650 mg via ORAL

## 2020-05-16 MED ORDER — SODIUM CHLORIDE 0.9% FLUSH
10.0000 mL | INTRAVENOUS | Status: DC | PRN
  Administered 2020-05-16: 10 mL
  Filled 2020-05-16: qty 10

## 2020-05-16 MED ORDER — SODIUM CHLORIDE 0.9 % IV SOLN
Freq: Once | INTRAVENOUS | Status: AC
  Filled 2020-05-16: qty 250

## 2020-05-16 MED ORDER — ACETAMINOPHEN 325 MG PO TABS
ORAL_TABLET | ORAL | Status: AC
  Filled 2020-05-16: qty 2

## 2020-05-16 MED ORDER — FAMOTIDINE 20 MG PO TABS
40.0000 mg | ORAL_TABLET | Freq: Once | ORAL | Status: AC
  Administered 2020-05-16: 40 mg via ORAL

## 2020-05-16 MED ORDER — SODIUM CHLORIDE 0.9 % IV SOLN
200.0000 mg | Freq: Once | INTRAVENOUS | Status: AC
  Administered 2020-05-16: 200 mg via INTRAVENOUS
  Filled 2020-05-16: qty 8

## 2020-05-16 MED ORDER — DIPHENHYDRAMINE HCL 25 MG PO TABS
25.0000 mg | ORAL_TABLET | Freq: Once | ORAL | Status: AC
  Administered 2020-05-16: 25 mg via ORAL
  Filled 2020-05-16: qty 1

## 2020-05-16 NOTE — Patient Instructions (Signed)

## 2020-05-16 NOTE — Patient Instructions (Signed)
[facility] Discharge Instructions for Patients Receiving Chemotherapy  Today you received the following chemotherapy agents: Pembrolizumab   To help prevent nausea and vomiting after your treatment, we encourage you to take your nausea medication as directed.    If you develop nausea and vomiting that is not controlled by your nausea medication, call the clinic.   BELOW ARE SYMPTOMS THAT SHOULD BE REPORTED IMMEDIATELY:  *FEVER GREATER THAN 100.5 F  *CHILLS WITH OR WITHOUT FEVER  NAUSEA AND VOMITING THAT IS NOT CONTROLLED WITH YOUR NAUSEA MEDICATION  *UNUSUAL SHORTNESS OF BREATH  *UNUSUAL BRUISING OR BLEEDING  TENDERNESS IN MOUTH AND THROAT WITH OR WITHOUT PRESENCE OF ULCERS  *URINARY PROBLEMS  *BOWEL PROBLEMS  UNUSUAL RASH Items with * indicate a potential emergency and should be followed up as soon as possible.  Feel free to call the clinic should you have any questions or concerns. The clinic phone number is [phone].  Please show the CHEMO ALERT CARD at check-in to the Emergency Department and triage nurse.   

## 2020-06-06 ENCOUNTER — Inpatient Hospital Stay: Payer: Medicaid Other | Admitting: Hematology

## 2020-06-06 ENCOUNTER — Inpatient Hospital Stay: Payer: Medicaid Other

## 2020-06-06 ENCOUNTER — Inpatient Hospital Stay: Payer: Medicaid Other | Attending: Hematology

## 2020-06-09 ENCOUNTER — Telehealth: Payer: Self-pay | Admitting: Hematology

## 2020-06-09 NOTE — Telephone Encounter (Signed)
R/s appt per 6/11 sch message - unable to reach pt. Left message for patient with new appt date and time.

## 2020-06-25 NOTE — Progress Notes (Signed)
HEMATOLOGY ONCOLOGY PROGRESS NOTE  Date of service:   06/27/20     Patient Care Team: Patient, No Pcp Per as PCP - General (General Practice)  Chief complaint: Follow-up for Hodgkin's lymphoma  Diagnosis:   Refractory Mixed cellularity Hodgkin's lymphoma IVBE with extensive lymphadenopathy including right axillary, mediastinal and upper retroperitoneal and now biopsy proven pulmonary involvement. She was noted to have significant constitutional symptoms including significant weight loss, fevers chills and some night sweats.  Current Treatment:  Pembrolizumab- 4th line. Patient declined HDT/Auto HSCT or consideration of Car-T cell therapy.  Previous treatment  5 cycles of AVD (without bleomycin due to lung involvement and DLCO of 37%, active smoker) Multiple avoidable treatment delays due to the patient's noncompliance with follow-up for avoidable reasons. She has been counseled repeatedly that this would increase the likelihood of unfavorable outcome.  2nd line therapy with Bendamustine + Brentuximab s/p 7 cycles.  3rd line therapy with ICE s/p 2 cycles   INTERVAL HISTORY:  Heidi [name] is here for follow-up for her Hodgkins lymphoma for C21D1 Pembrolizumab treatment. The patient's last visit with [place] was on 04/25/20. The pt reports that she is doing well overall.  The pt reports she is good. Pt is working again. She has not decided to do a transplant. Pt is not drinking alcohol or taking tylenol.   Lab results today (06/27/20) of CBC w/diff and CMP is as follows: all values are WNL except for WBC at 3.4K, RBC at 3.51, Hemoglobin at 11.9, HCT at 35, Lymph Abs at 0.4K, Glucose at 157, AST at 72, ALT at 57  On review of systems, pt  denies diarrhea, skin rashes, mouth soars, change in breathing, abdominal pain, irregular bowl movements and any other symptoms.   REVIEW OF SYSTEMS:  A 10+ POINT REVIEW OF SYSTEMS WAS OBTAINED including neurology, dermatology, psychiatry, cardiac,  respiratory, lymph, extremities, GI, GU, Musculoskeletal, constitutional, breasts, reproductive, HEENT.  All pertinent positives are noted in the HPI.  All others are negative.   Past Medical History:  Diagnosis Date  . ARDS (adult respiratory distress syndrome) ([place])   . Asthma   . Hodgkin lymphoma ([place])   . Hypertension     . Past Surgical History:  Procedure Laterality Date  . AXILLARY LYMPH NODE BIOPSY Right 03/19/2016   Procedure: AXILLARY LYMPH NODE BIOPSY;  Surgeon: Armandina Gemma, MD;  Location: [facility];  Service: General;  Laterality: Right;  . IR GENERIC HISTORICAL  12/22/2016   IR FLUORO GUIDE PORT INSERTION LEFT 12/22/2016 [facility]  . IR GENERIC HISTORICAL  12/22/2016   IR US GUIDE VASC ACCESS LEFT 12/22/2016 [facility]  . IR GENERIC HISTORICAL  12/22/2016   IR CV LINE INJECTION 12/22/2016 [facility]  . IR GENERIC HISTORICAL  12/22/2016   IR REMOVAL TUN ACCESS W/ PORT W/O FL MOD SED 12/22/2016 [facility]  . VIDEO BRONCHOSCOPY Bilateral 11/26/2016   Procedure: VIDEO BRONCHOSCOPY WITH FLUORO;  Surgeon: Rigoberto Noel, MD;  Location: [facility];  Service: Cardiopulmonary;  Laterality: Bilateral;    . Social History   Tobacco Use  . Smoking status: Former Smoker    Packs/day: 0.50    Years: 15.00    Pack years: 7.50    Types: Cigarettes    Quit date: 03/27/2016    Years since quitting: 4.2  . Smokeless tobacco: Never Used  Vaping Use  . Vaping Use: Never used  Substance Use Topics  . Alcohol use: Yes    Comment: occasional now  .  Drug use: Not on file    ALLERGIES:  has No Known Allergies.  MEDICATIONS:  Current Outpatient Medications  Medication Sig Dispense Refill  . acyclovir (ZOVIRAX) 400 MG tablet Take 1 tablet (400 mg total) by mouth 2 (two) times daily. Start after completion of Valtrex (Patient not taking: Reported on 01/11/2020) 60 tablet 6  . amitriptyline (ELAVIL) 10 MG tablet Take 1 tablet (10 mg total) by mouth at bedtime. (Patient not  taking: Reported on 01/11/2020) 20 tablet 0  . amoxicillin-clavulanate (AUGMENTIN) 875-125 MG tablet Take 1 tablet by mouth every 12 (twelve) hours. (Patient not taking: Reported on 01/11/2020) 14 tablet 0  . gabapentin (NEURONTIN) 300 MG capsule Take 1 capsule (300 mg total) by mouth 2 (two) times daily. (Patient not taking: Reported on 01/11/2020) 60 capsule 0  . oxyCODONE-acetaminophen (PERCOCET/ROXICET) 5-325 MG tablet Take 1-2 tablets by mouth every 6 (six) hours as needed for severe pain. (Patient not taking: Reported on 01/11/2020) 50 tablet 0   No current facility-administered medications for this visit.   Facility-Administered Medications Ordered in Other Visits  Medication Dose Route Frequency Provider Last Rate Last Admin  . sodium chloride flush (NS) 0.9 % injection 10 mL  10 mL Intracatheter PRN Brunetta Genera, MD        PHYSICAL EXAMINATION: ECOG FS:1 - Symptomatic but completely ambulatory  Vitals:   06/27/20 1025  BP: 111/75  Pulse: 94  Resp: 18  Temp: 97.6 F (36.4 C)  SpO2: 98%   Wt Readings from Last 3 Encounters:  06/27/20 144 lb 9.6 oz (65.6 kg)  04/25/20 150 lb 3.2 oz (68.1 kg)  04/04/20 152 lb 12.8 oz (69.3 kg)   Body mass index is 22.65 kg/m.    GENERAL:alert, in no acute distress and comfortable SKIN: no acute rashes, no significant lesions EYES: conjunctiva are pink and non-injected, sclera anicteric OROPHARYNX: MMM, no exudates, no oropharyngeal erythema or ulceration NECK: supple, no JVD LYMPH:  no palpable lymphadenopathy in the cervical, axillary or inguinal regions LUNGS: clear to auscultation b/l with normal respiratory effort HEART: regular rate & rhythm ABDOMEN:  normoactive bowel sounds , non tender, not distended. Extremity: no pedal edema PSYCH: alert & oriented x 3 with fluent speech NEURO: no focal motor/sensory deficits  LABORATORY DATA:   I have reviewed the data as listed  . CBC Latest Ref Rng & Units 06/27/2020 05/16/2020  04/25/2020  WBC 4.0 - 10.5 K/uL 3.4(L) 4.5 3.7(L)  Hemoglobin 12.0 - 15.0 g/dL 11.9(L) 11.9(L) 12.2  Hematocrit 36 - 46 % 35.0(L) 35.2(L) 36.2  Platelets 150 - 400 K/uL 177 185 174   . CBC    Component Value Date/Time   WBC 3.4 (L) 06/27/2020 0948   RBC 3.51 (L) 06/27/2020 0948   HGB 11.9 (L) 06/27/2020 0948   HGB 10.6 (L) 11/07/2018 1047   HGB 11.7 07/20/2017 1315   HCT 35.0 (L) 06/27/2020 0948   HCT 35.2 07/20/2017 1315   PLT 177 06/27/2020 0948   PLT 473 (H) 11/07/2018 1047   PLT 266 07/20/2017 1315   MCV 99.7 06/27/2020 0948   MCV 93.2 07/20/2017 1315   MCH 33.9 06/27/2020 0948   MCHC 34.0 06/27/2020 0948   RDW 12.8 06/27/2020 0948   RDW 14.5 07/20/2017 1315   LYMPHSABS 0.4 (L) 06/27/2020 0948   LYMPHSABS 0.3 (L) 07/20/2017 1315   MONOABS 0.4 06/27/2020 0948   MONOABS 0.5 07/20/2017 1315   EOSABS 0.1 06/27/2020 0948   EOSABS 0.2 07/20/2017 1315   BASOSABS  0.0 06/27/2020 0948   BASOSABS 0.0 07/20/2017 1315   . CMP Latest Ref Rng & Units 06/27/2020 05/16/2020 04/25/2020  Glucose 70 - 99 mg/dL 157(H) 65(L) 104(H)  BUN 6 - 20 mg/dL 8 10 9   Creatinine 0.44 - 1.00 mg/dL 0.89 0.90 0.84  Sodium 135 - 145 mmol/L 138 139 140  Potassium 3.5 - 5.1 mmol/L 3.8 4.0 4.1  Chloride 98 - 111 mmol/L 100 103 101  CO2 22 - 32 mmol/L 24 25 28   Calcium 8.9 - 10.3 mg/dL 9.2 9.1 9.0  Total Protein 6.5 - 8.1 g/dL 7.4 7.3 7.2  Total Bilirubin 0.3 - 1.2 mg/dL 0.6 0.5 0.9  Alkaline Phos 38 - 126 U/L 70 66 79  AST 15 - 41 U/L 72(H) 39 79(H)  ALT 0 - 44 U/L 57(H) 41 74(H)     RADIOGRAPHIC STUDIES: I have personally reviewed the radiological images as listed and agreed with the findings in the report. No results found.  ASSESSMENT & PLAN:   34 y.o. 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 She  was noted to have significant constitutional symptoms including significant weight loss, fevers chills and some night sweats. HIV negative Hepatitis C and hepatitis B serologies negative. Echo with normal ejection fraction. Patient was treated with 5 cycles of AVD (Bleomycin held due to poor DLCO 37% and ongoing smoking). Multiple avoidable treatment delays due to the patient's noncompliance with follow-up for avoidable reasons. She has been counseled repeatedly that this would increase the likelihood of unfavorable outcome.  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 S/p 6 cycles of 2nd line treatment with Bendamustine/Brentuximab And 1 cycle of Bretuximab alone  -PET/CT scan results from 04/14/2017 were discussed in details. She appears to have some persistent disease in her right lung at Deauville 5. It is difficult to say if this is recurrent or persistent disease since the patient failed to follow-up on multiple scheduled PET/CT scans in the early part and prior to her second line treatment.  Patient was lost to followup for >1 yr  09/26/18 PET/CT revealed Imaging findings compatible with recurrence of disease. 2. Multiple new large areas of hypermetabolic nodularity and airspace consolidation within both lungs which is presumed to represent pulmonary involvement by lymphoma. Deauville criteria 5. 3. New hypermetabolic left supraclavicular, left retroperitoneal, and bilateral pelvic lymph nodes. Deauville criteria 5. 4. Multifocal hypermetabolic osseous lesions. Deauville criteria 5. 5. Small volume of ascites, new.   12/06/18 PET/CT revealed Generally improved appearance with previously mostly Deauville 5 activity now mostly Deauville 4 activity. 2. Layering of much of the airspace opacity in the lungs, although considerable right perihilar airspace opacity remains. The remaining pulmonary opacities are assess this Deauville 5 on the right and over L4 on the  left, and were previously Deauville 5 bilaterally. 3. Mildly reduced size and moderately reduced activity in the abdominopelvic lymph nodes which are now dove L4. 4. The previously seen left supraclavicular lymph node seems to have completely resolved (Deauville 0). 5. The skeletal lesions remain at Deauville 4, although in absolute terms have decreased in SUV compared to previous. 6. No new regions of malignant involvement compared to prior exam. 7. Other imaging findings of potential clinical significance: Low-density blood pool suggests anemia. Pectus excavatum. Volume loss in the right hemithorax.  06/13/19 PET/CT revealed "No abnormal hypermetabolism (Deauville 1). 2. Mixed lytic and  sclerotic osseous lesions appear more prominent than on 12/06/2018 but do not have associated hypermetabolism, indicative of interval healing." 01/31/2020 CT ABDOMEN PELVIS W CONTRAST (Accession [id number]) and CT CHEST W CONTRAST (Accession [id number]) revealed "1. Small pre pericardial lymph node, not present on the prior study. 2. Stable volume loss in the right chest with shift of mediastinal structures into the right chest. 3. No change in the appearance of multifocal bony sclerosis, without signs of FDG uptake on the previous exam, attention on follow-up."  #2 s/p hypoxic respiratory failurewith dense right lung consolidation and left upper lobe consolidation with SVC syndrome. Patient has completed palliative radiation to the right lung mass causing SVC compression.  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  #4 Non compliancewith clinic and treatment followup. Missed 2nd dose of bendamustine with C2. Has missed multiple appointment for her PET/CT and missed appointment at [facility] for consideration of Transplant.  Pt was lost to  follow up after July 2018 and returned on 08/31/18 Discussed the patient's goals of care and the pt noted that she is ready to begin treatment again and maintain compliance and follow ups   #5 h/o Grade 1 neuropathyfrom Brentuximab-Vedotin - resolved  #6 Shingles outbreak - curentlyresolved but with significant post herpetic neuralgia  #7 Significant anemia and thrombocytopenia after C1 of ICE -- will need close monitoring.  #8 Abnormal LFTs ? Related to Pembrolizumab - stable today  PLAN: -Discussed pt labwork today, 06/27/20; of CBC w/diff and CMP is as follows: all values are WNL except for WBC at 3.4K, RBC at 3.51, Hemoglobin at 11.9, HCT at 35, Lymph Abs at 0.4K, Glucose at 157, AST at 72, ALT at 57 -Advised on bone marrow transplant -best chance of being completely cured -Will continue to get scans every 4-6 months unless new symptoms arise  -No overt lab, clinical or radiographic evidence of CHL progression at this time. -Advised on absolute alcohol cessation  -Will see back in 6 weeks   FOLLOW UP: -Continue Pembrolizumab, portflush and labs every 3 weeks - plz schedule next 4 treatments - MD visit with every other treatment every 6 weeks.  The total time spent in the appt was 20 minutes and more than 50% was on counseling and direct patient cares.  All of the patient's questions were answered with apparent satisfaction. The patient knows to call the clinic with any problems, questions or concerns.  Sullivan Lone MD [place] AAHIVMS [facility] Phys[facility]  (Office):       [phone] (Work cell):  [phone] (Fax):           [phone]  I, Dawayne Cirri am acting as a scribe for Dr. Sullivan Lone.   .I have reviewed the above documentation for accuracy and completeness, and I agree with the above. Brunetta Genera MD

## 2020-06-27 ENCOUNTER — Other Ambulatory Visit: Payer: Self-pay

## 2020-06-27 ENCOUNTER — Inpatient Hospital Stay: Payer: Medicaid Other | Attending: Hematology

## 2020-06-27 ENCOUNTER — Inpatient Hospital Stay: Payer: Medicaid Other

## 2020-06-27 ENCOUNTER — Other Ambulatory Visit: Payer: Medicaid Other

## 2020-06-27 ENCOUNTER — Inpatient Hospital Stay (HOSPITAL_BASED_OUTPATIENT_CLINIC_OR_DEPARTMENT_OTHER): Payer: Medicaid Other | Admitting: Hematology

## 2020-06-27 VITALS — BP 111/75 | HR 94 | Temp 97.6°F | Resp 18 | Ht 67.0 in | Wt 144.6 lb

## 2020-06-27 DIAGNOSIS — R6883 Chills (without fever): Secondary | ICD-10-CM | POA: Insufficient documentation

## 2020-06-27 DIAGNOSIS — G35 Multiple sclerosis: Secondary | ICD-10-CM | POA: Insufficient documentation

## 2020-06-27 DIAGNOSIS — R61 Generalized hyperhidrosis: Secondary | ICD-10-CM | POA: Insufficient documentation

## 2020-06-27 DIAGNOSIS — Q676 Pectus excavatum: Secondary | ICD-10-CM | POA: Diagnosis not present

## 2020-06-27 DIAGNOSIS — Z5112 Encounter for antineoplastic immunotherapy: Secondary | ICD-10-CM

## 2020-06-27 DIAGNOSIS — C8198 Hodgkin lymphoma, unspecified, lymph nodes of multiple sites: Secondary | ICD-10-CM

## 2020-06-27 DIAGNOSIS — Z9119 Patient's noncompliance with other medical treatment and regimen: Secondary | ICD-10-CM | POA: Diagnosis not present

## 2020-06-27 DIAGNOSIS — Z87891 Personal history of nicotine dependence: Secondary | ICD-10-CM | POA: Diagnosis not present

## 2020-06-27 DIAGNOSIS — C8128 Mixed cellularity classical Hodgkin lymphoma, lymph nodes of multiple sites: Secondary | ICD-10-CM

## 2020-06-27 DIAGNOSIS — R918 Other nonspecific abnormal finding of lung field: Secondary | ICD-10-CM | POA: Diagnosis not present

## 2020-06-27 DIAGNOSIS — R945 Abnormal results of liver function studies: Secondary | ICD-10-CM

## 2020-06-27 DIAGNOSIS — Z7189 Other specified counseling: Secondary | ICD-10-CM

## 2020-06-27 DIAGNOSIS — D649 Anemia, unspecified: Secondary | ICD-10-CM | POA: Insufficient documentation

## 2020-06-27 DIAGNOSIS — Z9114 Patient's other noncompliance with medication regimen: Secondary | ICD-10-CM | POA: Diagnosis not present

## 2020-06-27 DIAGNOSIS — R634 Abnormal weight loss: Secondary | ICD-10-CM | POA: Insufficient documentation

## 2020-06-27 DIAGNOSIS — B0229 Other postherpetic nervous system involvement: Secondary | ICD-10-CM | POA: Insufficient documentation

## 2020-06-27 DIAGNOSIS — R188 Other ascites: Secondary | ICD-10-CM | POA: Diagnosis not present

## 2020-06-27 DIAGNOSIS — Z95828 Presence of other vascular implants and grafts: Secondary | ICD-10-CM

## 2020-06-27 DIAGNOSIS — R7989 Other specified abnormal findings of blood chemistry: Secondary | ICD-10-CM

## 2020-06-27 DIAGNOSIS — Z79899 Other long term (current) drug therapy: Secondary | ICD-10-CM | POA: Diagnosis not present

## 2020-06-27 LAB — CBC WITH DIFFERENTIAL/PLATELET
Abs Immature Granulocytes: 0.01 10*3/uL (ref 0.00–0.07)
Basophils Absolute: 0 10*3/uL (ref 0.0–0.1)
Basophils Relative: 0 %
Eosinophils Absolute: 0.1 10*3/uL (ref 0.0–0.5)
Eosinophils Relative: 2 %
HCT: 35 % — ABNORMAL LOW (ref 36.0–46.0)
Hemoglobin: 11.9 g/dL — ABNORMAL LOW (ref 12.0–15.0)
Immature Granulocytes: 0 %
Lymphocytes Relative: 12 %
Lymphs Abs: 0.4 10*3/uL — ABNORMAL LOW (ref 0.7–4.0)
MCH: 33.9 pg (ref 26.0–34.0)
MCHC: 34 g/dL (ref 30.0–36.0)
MCV: 99.7 fL (ref 80.0–100.0)
Monocytes Absolute: 0.4 10*3/uL (ref 0.1–1.0)
Monocytes Relative: 12 %
Neutro Abs: 2.5 10*3/uL (ref 1.7–7.7)
Neutrophils Relative %: 74 %
Platelets: 177 10*3/uL (ref 150–400)
RBC: 3.51 MIL/uL — ABNORMAL LOW (ref 3.87–5.11)
RDW: 12.8 % (ref 11.5–15.5)
WBC: 3.4 10*3/uL — ABNORMAL LOW (ref 4.0–10.5)
nRBC: 0 % (ref 0.0–0.2)

## 2020-06-27 LAB — CMP (CANCER CENTER ONLY)
ALT: 57 U/L — ABNORMAL HIGH (ref 0–44)
AST: 72 U/L — ABNORMAL HIGH (ref 15–41)
Albumin: 4.2 g/dL (ref 3.5–5.0)
Alkaline Phosphatase: 70 U/L (ref 38–126)
Anion gap: 14 (ref 5–15)
BUN: 8 mg/dL (ref 6–20)
CO2: 24 mmol/L (ref 22–32)
Calcium: 9.2 mg/dL (ref 8.9–10.3)
Chloride: 100 mmol/L (ref 98–111)
Creatinine: 0.89 mg/dL (ref 0.44–1.00)
GFR, Est AFR Am: >60 mL/min (ref >60)
GFR, Estimated: >60 mL/min (ref >60)
Glucose, Bld: 157 mg/dL — ABNORMAL HIGH (ref 70–99)
Potassium: 3.8 mmol/L (ref 3.5–5.1)
Sodium: 138 mmol/L (ref 135–145)
Total Bilirubin: 0.6 mg/dL (ref 0.3–1.2)
Total Protein: 7.4 g/dL (ref 6.5–8.1)

## 2020-06-27 MED ORDER — ACETAMINOPHEN 325 MG PO TABS
650.0000 mg | ORAL_TABLET | Freq: Once | ORAL | Status: AC
  Administered 2020-06-27: 650 mg via ORAL

## 2020-06-27 MED ORDER — SODIUM CHLORIDE 0.9% FLUSH
10.0000 mL | INTRAVENOUS | Status: DC | PRN
  Administered 2020-06-27: 10 mL
  Filled 2020-06-27: qty 10

## 2020-06-27 MED ORDER — HEPARIN SOD (PORK) LOCK FLUSH 100 UNIT/ML IV SOLN
500.0000 [IU] | Freq: Once | INTRAVENOUS | Status: AC | PRN
  Administered 2020-06-27: 500 [IU]
  Filled 2020-06-27: qty 5

## 2020-06-27 MED ORDER — FAMOTIDINE 20 MG PO TABS
ORAL_TABLET | ORAL | Status: AC
  Filled 2020-06-27: qty 1

## 2020-06-27 MED ORDER — SODIUM CHLORIDE 0.9 % IV SOLN
Freq: Once | INTRAVENOUS | Status: AC
  Filled 2020-06-27: qty 250

## 2020-06-27 MED ORDER — DIPHENHYDRAMINE HCL 25 MG PO CAPS
ORAL_CAPSULE | ORAL | Status: AC
  Filled 2020-06-27: qty 1

## 2020-06-27 MED ORDER — FAMOTIDINE 20 MG PO TABS
ORAL_TABLET | ORAL | Status: AC
  Filled 2020-06-27: qty 2

## 2020-06-27 MED ORDER — SODIUM CHLORIDE 0.9 % IV SOLN
200.0000 mg | Freq: Once | INTRAVENOUS | Status: AC
  Administered 2020-06-27: 200 mg via INTRAVENOUS
  Filled 2020-06-27: qty 8

## 2020-06-27 MED ORDER — FAMOTIDINE 20 MG PO TABS
40.0000 mg | ORAL_TABLET | Freq: Once | ORAL | Status: AC
  Administered 2020-06-27: 40 mg via ORAL

## 2020-06-27 MED ORDER — ACETAMINOPHEN 325 MG PO TABS
ORAL_TABLET | ORAL | Status: AC
  Filled 2020-06-27: qty 2

## 2020-06-27 MED ORDER — DIPHENHYDRAMINE HCL 25 MG PO TABS
25.0000 mg | ORAL_TABLET | Freq: Once | ORAL | Status: AC
  Administered 2020-06-27: 25 mg via ORAL
  Filled 2020-06-27: qty 1

## 2020-06-27 NOTE — Patient Instructions (Signed)
[facility] Center Discharge Instructions for Patients Receiving Chemotherapy  Today you received the following chemotherapy agents: Pembrolizumab   To help prevent nausea and vomiting after your treatment, we encourage you to take your nausea medication as directed.    If you develop nausea and vomiting that is not controlled by your nausea medication, call the clinic.   BELOW ARE SYMPTOMS THAT SHOULD BE REPORTED IMMEDIATELY:  *FEVER GREATER THAN 100.5 F  *CHILLS WITH OR WITHOUT FEVER  NAUSEA AND VOMITING THAT IS NOT CONTROLLED WITH YOUR NAUSEA MEDICATION  *UNUSUAL SHORTNESS OF BREATH  *UNUSUAL BRUISING OR BLEEDING  TENDERNESS IN MOUTH AND THROAT WITH OR WITHOUT PRESENCE OF ULCERS  *URINARY PROBLEMS  *BOWEL PROBLEMS  UNUSUAL RASH Items with * indicate a potential emergency and should be followed up as soon as possible.  Feel free to call the clinic should you have any questions or concerns. The clinic phone number is [phone].  Please show the CHEMO ALERT CARD at check-in to the Emergency Department and triage nurse.   

## 2020-06-27 NOTE — Patient Instructions (Signed)

## 2020-07-18 ENCOUNTER — Inpatient Hospital Stay: Payer: Medicaid Other

## 2020-07-21 ENCOUNTER — Inpatient Hospital Stay: Payer: Medicaid Other

## 2020-07-21 ENCOUNTER — Other Ambulatory Visit: Payer: Self-pay

## 2020-07-21 VITALS — BP 137/94 | HR 86 | Temp 98.1°F | Resp 18 | Wt 144.0 lb

## 2020-07-21 DIAGNOSIS — R7989 Other specified abnormal findings of blood chemistry: Secondary | ICD-10-CM

## 2020-07-21 DIAGNOSIS — C8198 Hodgkin lymphoma, unspecified, lymph nodes of multiple sites: Secondary | ICD-10-CM

## 2020-07-21 DIAGNOSIS — Z5112 Encounter for antineoplastic immunotherapy: Secondary | ICD-10-CM | POA: Diagnosis not present

## 2020-07-21 DIAGNOSIS — Z7189 Other specified counseling: Secondary | ICD-10-CM

## 2020-07-21 LAB — CBC WITH DIFFERENTIAL/PLATELET
Abs Immature Granulocytes: 0.01 10*3/uL (ref 0.00–0.07)
Basophils Absolute: 0 10*3/uL (ref 0.0–0.1)
Basophils Relative: 0 %
Eosinophils Absolute: 0.1 10*3/uL (ref 0.0–0.5)
Eosinophils Relative: 3 %
HCT: 33.9 % — ABNORMAL LOW (ref 36.0–46.0)
Hemoglobin: 11.4 g/dL — ABNORMAL LOW (ref 12.0–15.0)
Immature Granulocytes: 0 %
Lymphocytes Relative: 14 %
Lymphs Abs: 0.4 10*3/uL — ABNORMAL LOW (ref 0.7–4.0)
MCH: 33.2 pg (ref 26.0–34.0)
MCHC: 33.6 g/dL (ref 30.0–36.0)
MCV: 98.8 fL (ref 80.0–100.0)
Monocytes Absolute: 0.4 10*3/uL (ref 0.1–1.0)
Monocytes Relative: 15 %
Neutro Abs: 1.7 10*3/uL (ref 1.7–7.7)
Neutrophils Relative %: 68 %
Platelets: 145 10*3/uL — ABNORMAL LOW (ref 150–400)
RBC: 3.43 MIL/uL — ABNORMAL LOW (ref 3.87–5.11)
RDW: 12.4 % (ref 11.5–15.5)
WBC: 2.6 10*3/uL — ABNORMAL LOW (ref 4.0–10.5)
nRBC: 0 % (ref 0.0–0.2)

## 2020-07-21 LAB — CMP (CANCER CENTER ONLY)
ALT: 83 U/L — ABNORMAL HIGH (ref 0–44)
AST: 99 U/L — ABNORMAL HIGH (ref 15–41)
Albumin: 4.1 g/dL (ref 3.5–5.0)
Alkaline Phosphatase: 63 U/L (ref 38–126)
Anion gap: 13 (ref 5–15)
BUN: 8 mg/dL (ref 6–20)
CO2: 26 mmol/L (ref 22–32)
Calcium: 9.6 mg/dL (ref 8.9–10.3)
Chloride: 101 mmol/L (ref 98–111)
Creatinine: 0.82 mg/dL (ref 0.44–1.00)
GFR, Est AFR Am: >60 mL/min (ref >60)
GFR, Estimated: >60 mL/min (ref >60)
Glucose, Bld: 83 mg/dL (ref 70–99)
Potassium: 3.8 mmol/L (ref 3.5–5.1)
Sodium: 140 mmol/L (ref 135–145)
Total Bilirubin: 0.5 mg/dL (ref 0.3–1.2)
Total Protein: 7 g/dL (ref 6.5–8.1)

## 2020-07-21 MED ORDER — FAMOTIDINE 20 MG PO TABS
ORAL_TABLET | ORAL | Status: AC
  Filled 2020-07-21: qty 2

## 2020-07-21 MED ORDER — SODIUM CHLORIDE 0.9% FLUSH
10.0000 mL | INTRAVENOUS | Status: DC | PRN
  Administered 2020-07-21: 10 mL
  Filled 2020-07-21: qty 10

## 2020-07-21 MED ORDER — SODIUM CHLORIDE 0.9 % IV SOLN
Freq: Once | INTRAVENOUS | Status: AC
  Filled 2020-07-21: qty 250

## 2020-07-21 MED ORDER — DIPHENHYDRAMINE HCL 25 MG PO TABS
25.0000 mg | ORAL_TABLET | Freq: Once | ORAL | Status: AC
  Administered 2020-07-21: 25 mg via ORAL
  Filled 2020-07-21: qty 1

## 2020-07-21 MED ORDER — HEPARIN SOD (PORK) LOCK FLUSH 100 UNIT/ML IV SOLN
500.0000 [IU] | Freq: Once | INTRAVENOUS | Status: AC | PRN
  Administered 2020-07-21: 500 [IU]
  Filled 2020-07-21: qty 5

## 2020-07-21 MED ORDER — ACETAMINOPHEN 325 MG PO TABS
650.0000 mg | ORAL_TABLET | Freq: Once | ORAL | Status: AC
  Administered 2020-07-21: 650 mg via ORAL

## 2020-07-21 MED ORDER — SODIUM CHLORIDE 0.9 % IV SOLN
200.0000 mg | Freq: Once | INTRAVENOUS | Status: AC
  Administered 2020-07-21: 200 mg via INTRAVENOUS
  Filled 2020-07-21: qty 8

## 2020-07-21 MED ORDER — FAMOTIDINE 20 MG PO TABS
40.0000 mg | ORAL_TABLET | Freq: Once | ORAL | Status: AC
  Administered 2020-07-21: 40 mg via ORAL

## 2020-07-21 MED ORDER — ACETAMINOPHEN 325 MG PO TABS
ORAL_TABLET | ORAL | Status: AC
  Filled 2020-07-21: qty 2

## 2020-07-21 MED ORDER — DIPHENHYDRAMINE HCL 25 MG PO CAPS
ORAL_CAPSULE | ORAL | Status: AC
  Filled 2020-07-21: qty 1

## 2020-07-21 NOTE — Patient Instructions (Signed)
[facility] Discharge Instructions for Patients Receiving Chemotherapy  Today you received the following chemotherapy agents: Pembrolizumab   To help prevent nausea and vomiting after your treatment, we encourage you to take your nausea medication as directed.    If you develop nausea and vomiting that is not controlled by your nausea medication, call the clinic.   BELOW ARE SYMPTOMS THAT SHOULD BE REPORTED IMMEDIATELY:  *FEVER GREATER THAN 100.5 F  *CHILLS WITH OR WITHOUT FEVER  NAUSEA AND VOMITING THAT IS NOT CONTROLLED WITH YOUR NAUSEA MEDICATION  *UNUSUAL SHORTNESS OF BREATH  *UNUSUAL BRUISING OR BLEEDING  TENDERNESS IN MOUTH AND THROAT WITH OR WITHOUT PRESENCE OF ULCERS  *URINARY PROBLEMS  *BOWEL PROBLEMS  UNUSUAL RASH Items with * indicate a potential emergency and should be followed up as soon as possible.  Feel free to call the clinic should you have any questions or concerns. The clinic phone number is [phone].  Please show the CHEMO ALERT CARD at check-in to the Emergency Department and triage nurse.   

## 2020-07-21 NOTE — Progress Notes (Signed)
Per Dr. Irene Limbo, okay for patient to receive treatment today at AST-99 and ALT-83

## 2020-08-07 ENCOUNTER — Inpatient Hospital Stay: Payer: Medicaid Other

## 2020-08-07 ENCOUNTER — Inpatient Hospital Stay: Payer: Medicaid Other | Admitting: Hematology

## 2020-08-07 ENCOUNTER — Telehealth: Payer: Self-pay | Admitting: Hematology

## 2020-08-07 NOTE — Telephone Encounter (Signed)
Scheduled appt per 8/12 sch msg - pt is aware of new appts starting 8/17

## 2020-08-12 ENCOUNTER — Other Ambulatory Visit: Payer: Medicaid Other

## 2020-08-12 ENCOUNTER — Ambulatory Visit: Payer: Medicaid Other | Admitting: Hematology

## 2020-08-12 ENCOUNTER — Ambulatory Visit: Payer: Medicaid Other

## 2020-08-22 ENCOUNTER — Inpatient Hospital Stay: Payer: Medicaid Other

## 2020-08-22 ENCOUNTER — Other Ambulatory Visit: Payer: Self-pay

## 2020-08-22 ENCOUNTER — Inpatient Hospital Stay: Payer: Medicaid Other | Attending: Hematology

## 2020-08-22 VITALS — BP 122/89 | HR 75 | Temp 99.1°F | Resp 16

## 2020-08-22 DIAGNOSIS — C8198 Hodgkin lymphoma, unspecified, lymph nodes of multiple sites: Secondary | ICD-10-CM

## 2020-08-22 DIAGNOSIS — D696 Thrombocytopenia, unspecified: Secondary | ICD-10-CM | POA: Diagnosis not present

## 2020-08-22 DIAGNOSIS — Z95828 Presence of other vascular implants and grafts: Secondary | ICD-10-CM

## 2020-08-22 DIAGNOSIS — C8128 Mixed cellularity classical Hodgkin lymphoma, lymph nodes of multiple sites: Secondary | ICD-10-CM | POA: Diagnosis present

## 2020-08-22 DIAGNOSIS — Z5112 Encounter for antineoplastic immunotherapy: Secondary | ICD-10-CM | POA: Insufficient documentation

## 2020-08-22 DIAGNOSIS — Z87891 Personal history of nicotine dependence: Secondary | ICD-10-CM | POA: Diagnosis not present

## 2020-08-22 DIAGNOSIS — D649 Anemia, unspecified: Secondary | ICD-10-CM | POA: Insufficient documentation

## 2020-08-22 DIAGNOSIS — Z7189 Other specified counseling: Secondary | ICD-10-CM

## 2020-08-22 DIAGNOSIS — Z79899 Other long term (current) drug therapy: Secondary | ICD-10-CM | POA: Diagnosis not present

## 2020-08-22 DIAGNOSIS — R7989 Other specified abnormal findings of blood chemistry: Secondary | ICD-10-CM

## 2020-08-22 LAB — CMP (CANCER CENTER ONLY)
ALT: 26 U/L (ref 0–44)
AST: 32 U/L (ref 15–41)
Albumin: 4.2 g/dL (ref 3.5–5.0)
Alkaline Phosphatase: 56 U/L (ref 38–126)
Anion gap: 9 (ref 5–15)
BUN: 10 mg/dL (ref 6–20)
CO2: 27 mmol/L (ref 22–32)
Calcium: 10 mg/dL (ref 8.9–10.3)
Chloride: 101 mmol/L (ref 98–111)
Creatinine: 0.8 mg/dL (ref 0.44–1.00)
GFR, Est AFR Am: >60 mL/min (ref >60)
GFR, Estimated: >60 mL/min (ref >60)
Glucose, Bld: 63 mg/dL — ABNORMAL LOW (ref 70–99)
Potassium: 4.2 mmol/L (ref 3.5–5.1)
Sodium: 137 mmol/L (ref 135–145)
Total Bilirubin: 0.8 mg/dL (ref 0.3–1.2)
Total Protein: 7.3 g/dL (ref 6.5–8.1)

## 2020-08-22 LAB — CBC WITH DIFFERENTIAL/PLATELET
Abs Immature Granulocytes: 0.01 10*3/uL (ref 0.00–0.07)
Basophils Absolute: 0 10*3/uL (ref 0.0–0.1)
Basophils Relative: 1 %
Eosinophils Absolute: 0.1 10*3/uL (ref 0.0–0.5)
Eosinophils Relative: 2 %
HCT: 35.9 % — ABNORMAL LOW (ref 36.0–46.0)
Hemoglobin: 12.1 g/dL (ref 12.0–15.0)
Immature Granulocytes: 0 %
Lymphocytes Relative: 12 %
Lymphs Abs: 0.5 10*3/uL — ABNORMAL LOW (ref 0.7–4.0)
MCH: 33.4 pg (ref 26.0–34.0)
MCHC: 33.7 g/dL (ref 30.0–36.0)
MCV: 99.2 fL (ref 80.0–100.0)
Monocytes Absolute: 0.7 10*3/uL (ref 0.1–1.0)
Monocytes Relative: 17 %
Neutro Abs: 2.5 10*3/uL (ref 1.7–7.7)
Neutrophils Relative %: 68 %
Platelets: 184 10*3/uL (ref 150–400)
RBC: 3.62 MIL/uL — ABNORMAL LOW (ref 3.87–5.11)
RDW: 12.1 % (ref 11.5–15.5)
WBC: 3.7 10*3/uL — ABNORMAL LOW (ref 4.0–10.5)
nRBC: 0 % (ref 0.0–0.2)

## 2020-08-22 MED ORDER — HEPARIN SOD (PORK) LOCK FLUSH 100 UNIT/ML IV SOLN
500.0000 [IU] | Freq: Once | INTRAVENOUS | Status: AC | PRN
  Administered 2020-08-22: 500 [IU]
  Filled 2020-08-22: qty 5

## 2020-08-22 MED ORDER — SODIUM CHLORIDE 0.9 % IV SOLN
200.0000 mg | Freq: Once | INTRAVENOUS | Status: AC
  Administered 2020-08-22: 200 mg via INTRAVENOUS
  Filled 2020-08-22: qty 8

## 2020-08-22 MED ORDER — DIPHENHYDRAMINE HCL 25 MG PO CAPS
ORAL_CAPSULE | ORAL | Status: AC
  Filled 2020-08-22: qty 1

## 2020-08-22 MED ORDER — SODIUM CHLORIDE 0.9% FLUSH
10.0000 mL | INTRAVENOUS | Status: DC | PRN
  Administered 2020-08-22: 10 mL
  Filled 2020-08-22: qty 10

## 2020-08-22 MED ORDER — ACETAMINOPHEN 325 MG PO TABS
ORAL_TABLET | ORAL | Status: AC
  Filled 2020-08-22: qty 2

## 2020-08-22 MED ORDER — DIPHENHYDRAMINE HCL 25 MG PO TABS
25.0000 mg | ORAL_TABLET | Freq: Once | ORAL | Status: AC
  Administered 2020-08-22: 25 mg via ORAL
  Filled 2020-08-22: qty 1

## 2020-08-22 MED ORDER — FAMOTIDINE 20 MG PO TABS
ORAL_TABLET | ORAL | Status: AC
  Filled 2020-08-22: qty 2

## 2020-08-22 MED ORDER — FAMOTIDINE 20 MG PO TABS
40.0000 mg | ORAL_TABLET | Freq: Once | ORAL | Status: AC
  Administered 2020-08-22: 40 mg via ORAL

## 2020-08-22 MED ORDER — ACETAMINOPHEN 325 MG PO TABS
650.0000 mg | ORAL_TABLET | Freq: Once | ORAL | Status: AC
  Administered 2020-08-22: 650 mg via ORAL

## 2020-08-22 MED ORDER — SODIUM CHLORIDE 0.9 % IV SOLN
Freq: Once | INTRAVENOUS | Status: AC
  Filled 2020-08-22: qty 250

## 2020-08-22 NOTE — Patient Instructions (Signed)
[facility] Discharge Instructions for Patients Receiving Chemotherapy  Today you received the following chemotherapy agents: Pembrolizumab   To help prevent nausea and vomiting after your treatment, we encourage you to take your nausea medication as directed.    If you develop nausea and vomiting that is not controlled by your nausea medication, call the clinic.   BELOW ARE SYMPTOMS THAT SHOULD BE REPORTED IMMEDIATELY:  *FEVER GREATER THAN 100.5 F  *CHILLS WITH OR WITHOUT FEVER  NAUSEA AND VOMITING THAT IS NOT CONTROLLED WITH YOUR NAUSEA MEDICATION  *UNUSUAL SHORTNESS OF BREATH  *UNUSUAL BRUISING OR BLEEDING  TENDERNESS IN MOUTH AND THROAT WITH OR WITHOUT PRESENCE OF ULCERS  *URINARY PROBLEMS  *BOWEL PROBLEMS  UNUSUAL RASH Items with * indicate a potential emergency and should be followed up as soon as possible.  Feel free to call the clinic should you have any questions or concerns. The clinic phone number is [phone].  Please show the CHEMO ALERT CARD at check-in to the Emergency Department and triage nurse.   

## 2020-08-22 NOTE — Patient Instructions (Signed)

## 2020-08-29 ENCOUNTER — Ambulatory Visit: Payer: Medicaid Other

## 2020-08-29 ENCOUNTER — Other Ambulatory Visit: Payer: Medicaid Other

## 2020-09-02 ENCOUNTER — Inpatient Hospital Stay: Payer: Medicaid Other

## 2020-09-19 ENCOUNTER — Other Ambulatory Visit: Payer: Medicaid Other

## 2020-09-19 ENCOUNTER — Ambulatory Visit: Payer: Medicaid Other | Admitting: Hematology

## 2020-09-19 ENCOUNTER — Ambulatory Visit: Payer: Medicaid Other

## 2020-09-23 ENCOUNTER — Inpatient Hospital Stay: Payer: Medicaid Other | Admitting: Hematology

## 2020-09-23 ENCOUNTER — Other Ambulatory Visit: Payer: Self-pay | Admitting: Hematology

## 2020-09-23 ENCOUNTER — Inpatient Hospital Stay: Payer: Medicaid Other

## 2020-09-23 ENCOUNTER — Inpatient Hospital Stay: Payer: Medicaid Other | Attending: Hematology

## 2020-09-23 DIAGNOSIS — C8198 Hodgkin lymphoma, unspecified, lymph nodes of multiple sites: Secondary | ICD-10-CM

## 2020-10-02 ENCOUNTER — Telehealth: Payer: Self-pay | Admitting: Hematology

## 2020-10-02 NOTE — Telephone Encounter (Signed)
Called pt - confirmed appt scheduled on 10/11 - pt is aware.

## 2020-10-06 ENCOUNTER — Ambulatory Visit: Payer: Medicaid Other

## 2020-10-06 ENCOUNTER — Other Ambulatory Visit: Payer: Medicaid Other

## 2020-10-06 ENCOUNTER — Telehealth: Payer: Self-pay | Admitting: *Deleted

## 2020-10-06 ENCOUNTER — Ambulatory Visit: Payer: Medicaid Other | Admitting: Hematology

## 2020-10-06 NOTE — Telephone Encounter (Signed)
Contacted patient at 1245. Patient did not come to 12n lab appt today (then was to have MD appt at 1p and infusion at 2p).   She states she caught a cold this weekend - coughing and sinus congestion - and will not be coming today. Encouraged covid test.  She wants to reschedule all appts: Lab with port flush, MD and infusion. Dr.Kale informed.

## 2020-10-21 ENCOUNTER — Telehealth: Payer: Self-pay | Admitting: Hematology

## 2020-10-21 NOTE — Telephone Encounter (Signed)
Patient called to reschedule cancelled 10/11 appointments, per patient's request appointment has been rescheduled 11/05. Informed providers nurse regarding rescheduled appointments.

## 2020-10-31 ENCOUNTER — Inpatient Hospital Stay: Payer: Medicaid Other | Admitting: Hematology

## 2020-10-31 ENCOUNTER — Inpatient Hospital Stay: Payer: Medicaid Other

## 2020-10-31 ENCOUNTER — Inpatient Hospital Stay: Payer: Medicaid Other | Attending: Hematology

## 2020-10-31 DIAGNOSIS — R6883 Chills (without fever): Secondary | ICD-10-CM | POA: Insufficient documentation

## 2020-10-31 DIAGNOSIS — Z9114 Patient's other noncompliance with medication regimen: Secondary | ICD-10-CM | POA: Insufficient documentation

## 2020-10-31 DIAGNOSIS — Q676 Pectus excavatum: Secondary | ICD-10-CM | POA: Insufficient documentation

## 2020-10-31 DIAGNOSIS — Z79899 Other long term (current) drug therapy: Secondary | ICD-10-CM | POA: Insufficient documentation

## 2020-10-31 DIAGNOSIS — R61 Generalized hyperhidrosis: Secondary | ICD-10-CM | POA: Insufficient documentation

## 2020-10-31 DIAGNOSIS — Z9119 Patient's noncompliance with other medical treatment and regimen: Secondary | ICD-10-CM | POA: Insufficient documentation

## 2020-10-31 DIAGNOSIS — Z5112 Encounter for antineoplastic immunotherapy: Secondary | ICD-10-CM | POA: Insufficient documentation

## 2020-10-31 DIAGNOSIS — D649 Anemia, unspecified: Secondary | ICD-10-CM | POA: Insufficient documentation

## 2020-10-31 DIAGNOSIS — R634 Abnormal weight loss: Secondary | ICD-10-CM | POA: Insufficient documentation

## 2020-10-31 DIAGNOSIS — R188 Other ascites: Secondary | ICD-10-CM | POA: Insufficient documentation

## 2020-10-31 DIAGNOSIS — B0229 Other postherpetic nervous system involvement: Secondary | ICD-10-CM | POA: Insufficient documentation

## 2020-10-31 DIAGNOSIS — C8198 Hodgkin lymphoma, unspecified, lymph nodes of multiple sites: Secondary | ICD-10-CM

## 2020-10-31 DIAGNOSIS — R590 Localized enlarged lymph nodes: Secondary | ICD-10-CM | POA: Insufficient documentation

## 2020-10-31 DIAGNOSIS — Z87891 Personal history of nicotine dependence: Secondary | ICD-10-CM | POA: Insufficient documentation

## 2020-10-31 DIAGNOSIS — R7989 Other specified abnormal findings of blood chemistry: Secondary | ICD-10-CM | POA: Insufficient documentation

## 2020-10-31 DIAGNOSIS — C8128 Mixed cellularity classical Hodgkin lymphoma, lymph nodes of multiple sites: Secondary | ICD-10-CM | POA: Insufficient documentation

## 2020-10-31 DIAGNOSIS — G35 Multiple sclerosis: Secondary | ICD-10-CM | POA: Insufficient documentation

## 2020-11-05 ENCOUNTER — Telehealth: Payer: Self-pay | Admitting: Hematology

## 2020-11-05 NOTE — Telephone Encounter (Signed)
R/s appt per 11/9 sch msg - pt is aware of appt date and time

## 2020-11-10 NOTE — Progress Notes (Signed)
HEMATOLOGY ONCOLOGY PROGRESS NOTE  Date of service:   11/11/20     Patient Care Team: Patient, No Pcp Per as PCP - General (General Practice)  Chief complaint: Follow-up for Hodgkin's lymphoma  Diagnosis:   Refractory Mixed cellularity Hodgkin's lymphoma IVBE with extensive lymphadenopathy including right axillary, mediastinal and upper retroperitoneal and now biopsy proven pulmonary involvement. She was noted to have significant constitutional symptoms including significant weight loss, fevers chills and some night sweats.  Current Treatment:  Pembrolizumab- 4th line. Patient declined HDT/Auto HSCT or consideration of Car-T cell therapy.  Previous treatment  5 cycles of AVD (without bleomycin due to lung involvement and DLCO of 37%, active smoker) Multiple avoidable treatment delays due to the patient's noncompliance with follow-up for avoidable reasons. She has been counseled repeatedly that this would increase the likelihood of unfavorable outcome.  2nd line therapy with Bendamustine + Brentuximab s/p 7 cycles.  3rd line therapy with ICE s/p 2 cycles   INTERVAL HISTORY: Ms Henigan is here for follow-up for her Hodgkins lymphoma for a very delayed C24D1 Pembrolizumab treatment. The patient's last visit with [place] was on 06/27/2020. The pt reports that she is doing well overall.  The pt reports that she has been dealing with a lot of stress from her previous job and the recent passing of her stepfather. Pt received her first dose of the COVID19 vaccines two months ago. She has not gotten the second or third dose.   Lab results today (11/11/20) of CBC w/diff and CMP is as follows: all values are WNL except for WBC at 3.0K, RBC at 3.57, Hgb at 11.9, HCT at 35.3, Lymphs Abs at 0.4K, AST at 51. 11/11/2020 Sed Rate at 9 11/11/2020 TSH at 1.125  On review of systems, pt reports stress and denies fevers, chills, SOB, night sweats, low appetite, weight loss, leg swelling, rash,  itching, abdominal pain and any other symptoms.    REVIEW OF SYSTEMS:  A 10+ POINT REVIEW OF SYSTEMS WAS OBTAINED including neurology, dermatology, psychiatry, cardiac, respiratory, lymph, extremities, GI, GU, Musculoskeletal, constitutional, breasts, reproductive, HEENT.  All pertinent positives are noted in the HPI.  All others are negative.   Past Medical History:  Diagnosis Date  . ARDS (adult respiratory distress syndrome) ([place])   . Asthma   . Hodgkin lymphoma ([place])   . Hypertension     . Past Surgical History:  Procedure Laterality Date  . AXILLARY LYMPH NODE BIOPSY Right 03/19/2016   Procedure: AXILLARY LYMPH NODE BIOPSY;  Surgeon: Armandina Gemma, MD;  Location: [facility];  Service: General;  Laterality: Right;  . IR GENERIC HISTORICAL  12/22/2016   IR FLUORO GUIDE PORT INSERTION LEFT 12/22/2016 [facility]  . IR GENERIC HISTORICAL  12/22/2016   IR US GUIDE VASC ACCESS LEFT 12/22/2016 [facility]  . IR GENERIC HISTORICAL  12/22/2016   IR CV LINE INJECTION 12/22/2016 [facility]  . IR GENERIC HISTORICAL  12/22/2016   IR REMOVAL TUN ACCESS W/ PORT W/O FL MOD SED 12/22/2016 [facility]  . VIDEO BRONCHOSCOPY Bilateral 11/26/2016   Procedure: VIDEO BRONCHOSCOPY WITH FLUORO;  Surgeon: Rigoberto Noel, MD;  Location: [facility];  Service: Cardiopulmonary;  Laterality: Bilateral;    . Social History   Tobacco Use  . Smoking status: Former Smoker    Packs/day: 0.50    Years: 15.00    Pack years: 7.50    Types: Cigarettes    Quit date: 03/27/2016    Years since quitting: 4.6  . Smokeless  tobacco: Never Used  Vaping Use  . Vaping Use: Never used  Substance Use Topics  . Alcohol use: Yes    Comment: occasional now  . Drug use: Not on file    ALLERGIES:  has No Known Allergies.  MEDICATIONS:  Current Outpatient Medications  Medication Sig Dispense Refill  . acyclovir (ZOVIRAX) 400 MG tablet Take 1 tablet (400 mg total) by mouth 2 (two) times daily. Start after  completion of Valtrex (Patient not taking: Reported on 01/11/2020) 60 tablet 6  . amitriptyline (ELAVIL) 10 MG tablet Take 1 tablet (10 mg total) by mouth at bedtime. (Patient not taking: Reported on 01/11/2020) 20 tablet 0  . amoxicillin-clavulanate (AUGMENTIN) 875-125 MG tablet Take 1 tablet by mouth every 12 (twelve) hours. (Patient not taking: Reported on 01/11/2020) 14 tablet 0  . gabapentin (NEURONTIN) 300 MG capsule Take 1 capsule (300 mg total) by mouth 2 (two) times daily. (Patient not taking: Reported on 01/11/2020) 60 capsule 0  . oxyCODONE-acetaminophen (PERCOCET/ROXICET) 5-325 MG tablet Take 1-2 tablets by mouth every 6 (six) hours as needed for severe pain. (Patient not taking: Reported on 01/11/2020) 50 tablet 0   No current facility-administered medications for this visit.   Facility-Administered Medications Ordered in Other Visits  Medication Dose Route Frequency Provider Last Rate Last Admin  . sodium chloride flush (NS) 0.9 % injection 10 mL  10 mL Intracatheter PRN Brunetta Genera, MD        PHYSICAL EXAMINATION: ECOG FS:1 - Symptomatic but completely ambulatory  Vitals:   11/11/20 0906  BP: (!) 143/98  Pulse: 82  Resp: 18  Temp: (!) 97.4 F (36.3 C)  SpO2: 100%   Wt Readings from Last 3 Encounters:  11/11/20 146 lb 8 oz (66.5 kg)  07/21/20 144 lb (65.3 kg)  06/27/20 144 lb 9.6 oz (65.6 kg)   Body mass index is 22.95 kg/m.    GENERAL:alert, in no acute distress and comfortable SKIN: no acute rashes, no significant lesions EYES: conjunctiva are pink and non-injected, sclera anicteric OROPHARYNX: MMM, no exudates, no oropharyngeal erythema or ulceration NECK: supple, no JVD LYMPH:  no palpable lymphadenopathy in the cervical, axillary or inguinal regions LUNGS: clear to auscultation b/l with normal respiratory effort HEART: regular rate & rhythm ABDOMEN:  normoactive bowel sounds , non tender, not distended. No palpable hepatosplenomegaly.  Extremity: no  pedal edema PSYCH: alert & oriented x 3 with fluent speech NEURO: no focal motor/sensory deficits  LABORATORY DATA:   I have reviewed the data as listed  . CBC Latest Ref Rng & Units 11/11/2020 08/22/2020 07/21/2020  WBC 4.0 - 10.5 K/uL 3.0(L) 3.7(L) 2.6(L)  Hemoglobin 12.0 - 15.0 g/dL 11.9(L) 12.1 11.4(L)  Hematocrit 36 - 46 % 35.3(L) 35.9(L) 33.9(L)  Platelets 150 - 400 K/uL 163 184 145(L)   . CBC    Component Value Date/Time   WBC 3.0 (L) 11/11/2020 0859   RBC 3.57 (L) 11/11/2020 0859   HGB 11.9 (L) 11/11/2020 0859   HGB 10.6 (L) 11/07/2018 1047   HGB 11.7 07/20/2017 1315   HCT 35.3 (L) 11/11/2020 0859   HCT 35.2 07/20/2017 1315   PLT 163 11/11/2020 0859   PLT 473 (H) 11/07/2018 1047   PLT 266 07/20/2017 1315   MCV 98.9 11/11/2020 0859   MCV 93.2 07/20/2017 1315   MCH 33.3 11/11/2020 0859   MCHC 33.7 11/11/2020 0859   RDW 11.7 11/11/2020 0859   RDW 14.5 07/20/2017 1315   LYMPHSABS 0.4 (L) 11/11/2020 4166  LYMPHSABS 0.3 (L) 07/20/2017 1315   MONOABS 0.6 11/11/2020 0859   MONOABS 0.5 07/20/2017 1315   EOSABS 0.1 11/11/2020 0859   EOSABS 0.2 07/20/2017 1315   BASOSABS 0.0 11/11/2020 0859   BASOSABS 0.0 07/20/2017 1315   . CMP Latest Ref Rng & Units 08/22/2020 07/21/2020 06/27/2020  Glucose 70 - 99 mg/dL 63(L) 83 157(H)  BUN 6 - 20 mg/dL 10 8 8   Creatinine 0.44 - 1.00 mg/dL 0.80 0.82 0.89  Sodium 135 - 145 mmol/L 137 140 138  Potassium 3.5 - 5.1 mmol/L 4.2 3.8 3.8  Chloride 98 - 111 mmol/L 101 101 100  CO2 22 - 32 mmol/L 27 26 24   Calcium 8.9 - 10.3 mg/dL 10.0 9.6 9.2  Total Protein 6.5 - 8.1 g/dL 7.3 7.0 7.4  Total Bilirubin 0.3 - 1.2 mg/dL 0.8 0.5 0.6  Alkaline Phos 38 - 126 U/L 56 63 70  AST 15 - 41 U/L 32 99(H) 72(H)  ALT 0 - 44 U/L 26 83(H) 57(H)     RADIOGRAPHIC STUDIES: I have personally reviewed the radiological images as listed and agreed with the findings in the report. No results found.  ASSESSMENT & PLAN:   34 y.o. African-American female  with  #1 Refractory/Progressive Mixed cellularity Hodgkin's lymphoma IV BEwith extensive lymphadenopathy including right axillary, mediastinal and upper retroperitoneal and now biopsy proven pulmonary involvement. She was noted to have significant constitutional symptoms including significant weight loss, fevers chills and some night sweats. HIV negative Hepatitis C and hepatitis B serologies negative. Echo with normal ejection fraction. Patient was treated with 5 cycles of AVD (Bleomycin held due to poor DLCO 37% and ongoing smoking). Multiple avoidable treatment delays due to the patient's noncompliance with follow-up for avoidable reasons. She has been counseled repeatedly that this would increase the likelihood of unfavorable outcome.  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 S/p 6 cycles of 2nd line treatment with Bendamustine/Brentuximab And 1 cycle of Bretuximab alone  -PET/CT scan results from 04/14/2017 were discussed in details. She appears to have some persistent disease in her right lung at Deauville 5. It is difficult to say if this is recurrent or persistent disease since the patient failed to follow-up on multiple scheduled PET/CT scans in the early part and prior to her second line treatment.  Patient was lost to followup for >1 yr  09/26/18 PET/CT revealed Imaging findings compatible with recurrence of disease. 2. Multiple new large areas of hypermetabolic nodularity and airspace consolidation within both lungs which is presumed to represent pulmonary involvement by lymphoma. Deauville criteria 5. 3. New hypermetabolic left supraclavicular, left retroperitoneal, and bilateral pelvic lymph nodes. Deauville criteria 5. 4. Multifocal hypermetabolic osseous lesions. Deauville criteria 5. 5. Small volume of ascites, new.   12/06/18 PET/CT revealed Generally improved appearance with previously mostly Deauville 5 activity now mostly Deauville 4  activity. 2. Layering of much of the airspace opacity in the lungs, although considerable right perihilar airspace opacity remains. The remaining pulmonary opacities are assess this Deauville 5 on the right and over L4 on the left, and were previously Deauville 5 bilaterally. 3. Mildly reduced size and moderately reduced activity in the abdominopelvic lymph nodes which are now dove L4. 4. The previously seen left supraclavicular lymph node seems to have completely resolved (Deauville 0). 5. The skeletal lesions remain at Deauville 4, although in absolute terms have decreased in SUV compared to previous. 6. No new regions of malignant involvement compared to prior exam. 7. Other  imaging findings of potential clinical significance: Low-density blood pool suggests anemia. Pectus excavatum. Volume loss in the right hemithorax.  06/13/19 PET/CT revealed "No abnormal hypermetabolism (Deauville 1). 2. Mixed lytic and sclerotic osseous lesions appear more prominent than on 12/06/2018 but do not have associated hypermetabolism, indicative of interval healing." 01/31/2020 CT ABDOMEN PELVIS W CONTRAST (Accession [id number]) and CT CHEST W CONTRAST (Accession [id number]) revealed "1. Small pre pericardial lymph node, not present on the prior study. 2. Stable volume loss in the right chest with shift of mediastinal structures into the right chest. 3. No change in the appearance of multifocal bony sclerosis, without signs of FDG uptake on the previous exam, attention on follow-up."  #2 s/p hypoxic respiratory failurewith dense right lung consolidation and left upper lobe consolidation with SVC syndrome. Patient has completed palliative radiation to the right lung mass causing SVC compression.  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  #4 Non  compliancewith clinic and treatment followup. Missed 2nd dose of bendamustine with C2. Has missed multiple appointment for her PET/CT and missed [facility] for [facility] of Transplant.  Pt was lost to follow up after July 2018 and returned on 08/31/18 Discussed the patient's goals of care and the pt noted that she is ready to begin treatment again and maintain compliance and follow ups   #5 h/o Grade 1 neuropathyfrom Brentuximab-Vedotin - resolved  #6 Shingles outbreak - curentlyresolved but with significant post herpetic neuralgia  #7 Significant anemia and thrombocytopenia after C1 of ICE -- will need close monitoring.  #8 Abnormal LFTs ? Related to Pembrolizumab - stable today  PLAN: -Discussed pt labwork today, 11/11/20; mild anemia, WBC is dropping, PLT are nml, AST is elevated, TSH & Sed Rate are WNL. -No overt lab or clinical evidence of CHL progression at this time. -The pt has no prohibitive toxicities from continuing Pembrolizumab every three weeks at this time.  -Recommend pt receive the other two COVID19 vaccines. Advised pt that Hodgkin's Lymphoma impairs her immune system. Plan to give second dose ofcovid19 vaccination after treatment today.  -Plan to repeat scans in 3-4 months.  -Will see back in three weeks with labs.   FOLLOW UP: -Continue Pembrolizumab, portflush and labs every 3 weeks - plz schedule next 3 treatments -MD visit in 3 weeks -Covid 2nd dose vaccination today after treatment   The total time spent in the appt was 20 minutes and more than 50% was on counseling and direct patient cares.  All of the patient's questions were answered with apparent satisfaction. The patient knows to call the clinic with any problems, questions or concerns.  Sullivan Lone MD [place] AAHIVMS [facility] Hematology/Oncology Physician [facility]  (Office):       [phone] (Work cell):  [phone] (Fax):           [phone]  I,  Yevette Edwards, am acting as a scribe for Dr. Sullivan Lone.   .I have reviewed the above documentation for accuracy and completeness, and I agree with the above. Brunetta Genera MD

## 2020-11-11 ENCOUNTER — Inpatient Hospital Stay (HOSPITAL_BASED_OUTPATIENT_CLINIC_OR_DEPARTMENT_OTHER): Payer: Medicaid Other | Admitting: Hematology

## 2020-11-11 ENCOUNTER — Inpatient Hospital Stay: Payer: Medicaid Other

## 2020-11-11 ENCOUNTER — Other Ambulatory Visit: Payer: Self-pay

## 2020-11-11 VITALS — BP 143/98 | HR 82 | Temp 97.4°F | Resp 18 | Ht 67.0 in | Wt 146.5 lb

## 2020-11-11 DIAGNOSIS — Z95828 Presence of other vascular implants and grafts: Secondary | ICD-10-CM

## 2020-11-11 DIAGNOSIS — C8198 Hodgkin lymphoma, unspecified, lymph nodes of multiple sites: Secondary | ICD-10-CM

## 2020-11-11 DIAGNOSIS — Z7189 Other specified counseling: Secondary | ICD-10-CM

## 2020-11-11 DIAGNOSIS — D649 Anemia, unspecified: Secondary | ICD-10-CM | POA: Diagnosis not present

## 2020-11-11 DIAGNOSIS — Z79899 Other long term (current) drug therapy: Secondary | ICD-10-CM | POA: Diagnosis not present

## 2020-11-11 DIAGNOSIS — R7989 Other specified abnormal findings of blood chemistry: Secondary | ICD-10-CM | POA: Diagnosis not present

## 2020-11-11 DIAGNOSIS — R188 Other ascites: Secondary | ICD-10-CM | POA: Diagnosis not present

## 2020-11-11 DIAGNOSIS — Z5112 Encounter for antineoplastic immunotherapy: Secondary | ICD-10-CM | POA: Diagnosis not present

## 2020-11-11 DIAGNOSIS — B0229 Other postherpetic nervous system involvement: Secondary | ICD-10-CM | POA: Diagnosis not present

## 2020-11-11 DIAGNOSIS — Q676 Pectus excavatum: Secondary | ICD-10-CM | POA: Diagnosis not present

## 2020-11-11 DIAGNOSIS — Z9119 Patient's noncompliance with other medical treatment and regimen: Secondary | ICD-10-CM | POA: Diagnosis not present

## 2020-11-11 DIAGNOSIS — R61 Generalized hyperhidrosis: Secondary | ICD-10-CM | POA: Diagnosis not present

## 2020-11-11 DIAGNOSIS — Z87891 Personal history of nicotine dependence: Secondary | ICD-10-CM | POA: Diagnosis not present

## 2020-11-11 DIAGNOSIS — Z9114 Patient's other noncompliance with medication regimen: Secondary | ICD-10-CM | POA: Diagnosis not present

## 2020-11-11 DIAGNOSIS — G35 Multiple sclerosis: Secondary | ICD-10-CM | POA: Diagnosis not present

## 2020-11-11 DIAGNOSIS — R6883 Chills (without fever): Secondary | ICD-10-CM | POA: Diagnosis not present

## 2020-11-11 DIAGNOSIS — R634 Abnormal weight loss: Secondary | ICD-10-CM | POA: Diagnosis not present

## 2020-11-11 DIAGNOSIS — C8128 Mixed cellularity classical Hodgkin lymphoma, lymph nodes of multiple sites: Secondary | ICD-10-CM | POA: Diagnosis present

## 2020-11-11 DIAGNOSIS — R590 Localized enlarged lymph nodes: Secondary | ICD-10-CM | POA: Diagnosis not present

## 2020-11-11 LAB — CBC WITH DIFFERENTIAL/PLATELET
Abs Immature Granulocytes: 0.01 10*3/uL (ref 0.00–0.07)
Basophils Absolute: 0 10*3/uL (ref 0.0–0.1)
Basophils Relative: 0 %
Eosinophils Absolute: 0.1 10*3/uL (ref 0.0–0.5)
Eosinophils Relative: 2 %
HCT: 35.3 % — ABNORMAL LOW (ref 36.0–46.0)
Hemoglobin: 11.9 g/dL — ABNORMAL LOW (ref 12.0–15.0)
Immature Granulocytes: 0 %
Lymphocytes Relative: 12 %
Lymphs Abs: 0.4 10*3/uL — ABNORMAL LOW (ref 0.7–4.0)
MCH: 33.3 pg (ref 26.0–34.0)
MCHC: 33.7 g/dL (ref 30.0–36.0)
MCV: 98.9 fL (ref 80.0–100.0)
Monocytes Absolute: 0.6 10*3/uL (ref 0.1–1.0)
Monocytes Relative: 19 %
Neutro Abs: 2 10*3/uL (ref 1.7–7.7)
Neutrophils Relative %: 67 %
Platelets: 163 10*3/uL (ref 150–400)
RBC: 3.57 MIL/uL — ABNORMAL LOW (ref 3.87–5.11)
RDW: 11.7 % (ref 11.5–15.5)
WBC: 3 10*3/uL — ABNORMAL LOW (ref 4.0–10.5)
nRBC: 0 % (ref 0.0–0.2)

## 2020-11-11 LAB — TSH: TSH: 1.125 u[IU]/mL (ref 0.308–3.960)

## 2020-11-11 LAB — CMP (CANCER CENTER ONLY)
ALT: 34 U/L (ref 0–44)
AST: 51 U/L — ABNORMAL HIGH (ref 15–41)
Albumin: 3.9 g/dL (ref 3.5–5.0)
Alkaline Phosphatase: 63 U/L (ref 38–126)
Anion gap: 10 (ref 5–15)
BUN: 8 mg/dL (ref 6–20)
CO2: 26 mmol/L (ref 22–32)
Calcium: 9 mg/dL (ref 8.9–10.3)
Chloride: 100 mmol/L (ref 98–111)
Creatinine: 0.77 mg/dL (ref 0.44–1.00)
GFR, Estimated: >60 mL/min (ref >60)
Glucose, Bld: 74 mg/dL (ref 70–99)
Potassium: 3.9 mmol/L (ref 3.5–5.1)
Sodium: 136 mmol/L (ref 135–145)
Total Bilirubin: 1.1 mg/dL (ref 0.3–1.2)
Total Protein: 7.1 g/dL (ref 6.5–8.1)

## 2020-11-11 LAB — SEDIMENTATION RATE: Sed Rate: 9 mm/hr (ref 0–22)

## 2020-11-11 MED ORDER — DIPHENHYDRAMINE HCL 25 MG PO CAPS
ORAL_CAPSULE | ORAL | Status: AC
  Filled 2020-11-11: qty 1

## 2020-11-11 MED ORDER — HEPARIN SOD (PORK) LOCK FLUSH 100 UNIT/ML IV SOLN
500.0000 [IU] | Freq: Once | INTRAVENOUS | Status: AC
  Administered 2020-11-11: 500 [IU] via INTRAVENOUS
  Filled 2020-11-11: qty 5

## 2020-11-11 MED ORDER — SODIUM CHLORIDE 0.9% FLUSH
10.0000 mL | INTRAVENOUS | Status: DC | PRN
  Administered 2020-11-11: 10 mL
  Filled 2020-11-11: qty 10

## 2020-11-11 MED ORDER — SODIUM CHLORIDE 0.9% FLUSH
3.0000 mL | Freq: Once | INTRAVENOUS | Status: AC
  Administered 2020-11-11: 3 mL via INTRAVENOUS
  Filled 2020-11-11: qty 10

## 2020-11-11 MED ORDER — SODIUM CHLORIDE 0.9 % IV SOLN
200.0000 mg | Freq: Once | INTRAVENOUS | Status: AC
  Administered 2020-11-11: 200 mg via INTRAVENOUS
  Filled 2020-11-11: qty 8

## 2020-11-11 MED ORDER — SODIUM CHLORIDE 0.9 % IV SOLN
Freq: Once | INTRAVENOUS | Status: AC
  Filled 2020-11-11: qty 250

## 2020-11-11 MED ORDER — ACETAMINOPHEN 325 MG PO TABS
ORAL_TABLET | ORAL | Status: AC
  Filled 2020-11-11: qty 2

## 2020-11-11 MED ORDER — DIPHENHYDRAMINE HCL 25 MG PO TABS
25.0000 mg | ORAL_TABLET | Freq: Once | ORAL | Status: AC
  Administered 2020-11-11: 25 mg via ORAL
  Filled 2020-11-11: qty 1

## 2020-11-11 MED ORDER — FAMOTIDINE 20 MG PO TABS
ORAL_TABLET | ORAL | Status: AC
  Filled 2020-11-11: qty 2

## 2020-11-11 MED ORDER — FAMOTIDINE 20 MG PO TABS
40.0000 mg | ORAL_TABLET | Freq: Once | ORAL | Status: AC
  Administered 2020-11-11: 40 mg via ORAL

## 2020-11-11 MED ORDER — ACETAMINOPHEN 325 MG PO TABS
650.0000 mg | ORAL_TABLET | Freq: Once | ORAL | Status: AC
  Administered 2020-11-11: 650 mg via ORAL

## 2020-11-11 NOTE — Progress Notes (Signed)
Patient did not bring vaccination card with her to appointment. Patient states she would like to wait to receive second dose of covid vaccine at her next appointment. Patient instructed to bring vaccination card to next appointment. Patient verbalized understanding.

## 2020-11-11 NOTE — Patient Instructions (Signed)
[facility] Discharge Instructions for Patients Receiving Chemotherapy  Today you received the following chemotherapy agents: Pembrolizumab   To help prevent nausea and vomiting after your treatment, we encourage you to take your nausea medication as directed.    If you develop nausea and vomiting that is not controlled by your nausea medication, call the clinic.   BELOW ARE SYMPTOMS THAT SHOULD BE REPORTED IMMEDIATELY:  *FEVER GREATER THAN 100.5 F  *CHILLS WITH OR WITHOUT FEVER  NAUSEA AND VOMITING THAT IS NOT CONTROLLED WITH YOUR NAUSEA MEDICATION  *UNUSUAL SHORTNESS OF BREATH  *UNUSUAL BRUISING OR BLEEDING  TENDERNESS IN MOUTH AND THROAT WITH OR WITHOUT PRESENCE OF ULCERS  *URINARY PROBLEMS  *BOWEL PROBLEMS  UNUSUAL RASH Items with * indicate a potential emergency and should be followed up as soon as possible.  Feel free to call the clinic should you have any questions or concerns. The clinic phone number is [phone].  Please show the CHEMO ALERT CARD at check-in to the Emergency Department and triage nurse.   

## 2020-11-11 NOTE — Patient Instructions (Signed)

## 2020-11-13 ENCOUNTER — Telehealth: Payer: Self-pay | Admitting: Hematology

## 2020-11-13 NOTE — Telephone Encounter (Signed)
Scheduled per los, patient has been called and voicemail was not set up. Calender will be mailed.

## 2020-12-01 NOTE — Progress Notes (Incomplete)
HEMATOLOGY ONCOLOGY PROGRESS NOTE  Date of service:   12/01/20     Patient Care Team: Patient, No Pcp Per as PCP - General (General Practice)  Chief complaint: Follow-up for Hodgkin's lymphoma  Diagnosis:   Refractory Mixed cellularity Hodgkin's lymphoma IVBE with extensive lymphadenopathy including right axillary, mediastinal and upper retroperitoneal and now biopsy proven pulmonary involvement. She was noted to have significant constitutional symptoms including significant weight loss, fevers chills and some night sweats.  Current Treatment:  Pembrolizumab- 4th line. Patient declined HDT/Auto HSCT or consideration of Car-T cell therapy.  Previous treatment  5 cycles of AVD (without bleomycin due to lung involvement and DLCO of 37%, active smoker) Multiple avoidable treatment delays due to the patient's noncompliance with follow-up for avoidable reasons. She has been counseled repeatedly that this would increase the likelihood of unfavorable outcome.  2nd line therapy with Bendamustine + Brentuximab s/p 7 cycles.  3rd line therapy with ICE s/p 2 cycles   INTERVAL HISTORY: [name] is here for follow-up for her Hodgkins lymphoma for a very delayed C25D1 Pembrolizumab treatment. The patient's last visit with [place] was on 11/11/2020. The pt reports that she is doing well overall.  The pt reports ***  Of note since the patient's last visit, pt has had *** completed on *** with results revealing ***.  Lab results today (12/01/20) of CBC w/diff and CMP is as follows: all values are WNL except for ***. 12/01/2020 Sed Rate at *** 12/01/2020 TSH at ***  On review of systems, pt reports *** and denies *** and any other symptoms.   A&P: -Discussed pt labwork today, 12/01/20; *** -***  REVIEW OF SYSTEMS:  A 10+ POINT REVIEW OF SYSTEMS WAS OBTAINED including neurology, dermatology, psychiatry, cardiac, respiratory, lymph, extremities, GI, GU, Musculoskeletal, constitutional,  breasts, reproductive, HEENT.  All pertinent positives are noted in the HPI.  All others are negative.   Past Medical History:  Diagnosis Date  . ARDS (adult respiratory distress syndrome) ([place])   . Asthma   . Hodgkin lymphoma ([place])   . Hypertension     . Past Surgical History:  Procedure Laterality Date  . AXILLARY LYMPH NODE BIOPSY Right 03/19/2016   Procedure: AXILLARY LYMPH NODE BIOPSY;  Surgeon: Armandina Gemma, MD;  Location: [facility];  Service: General;  Laterality: Right;  . IR GENERIC HISTORICAL  12/22/2016   IR FLUORO GUIDE PORT INSERTION LEFT 12/22/2016 [facility]  . IR GENERIC HISTORICAL  12/22/2016   IR US GUIDE VASC ACCESS LEFT 12/22/2016 [facility]  . IR GENERIC HISTORICAL  12/22/2016   IR CV LINE INJECTION 12/22/2016 [facility]  . IR GENERIC HISTORICAL  12/22/2016   IR REMOVAL TUN ACCESS W/ PORT W/O FL MOD SED 12/22/2016 [facility]  . VIDEO BRONCHOSCOPY Bilateral 11/26/2016   Procedure: VIDEO BRONCHOSCOPY WITH FLUORO;  Surgeon: Rigoberto Noel, MD;  Location: [facility];  Service: Cardiopulmonary;  Laterality: Bilateral;    . Social History   Tobacco Use  . Smoking status: Former Smoker    Packs/day: 0.50    Years: 15.00    Pack years: 7.50    Types: Cigarettes    Quit date: 03/27/2016    Years since quitting: 4.6  . Smokeless tobacco: Never Used  Vaping Use  . Vaping Use: Never used  Substance Use Topics  . Alcohol use: Yes    Comment: occasional now  . Drug use: Not on file    ALLERGIES:  has No Known Allergies.  MEDICATIONS:  Current  Outpatient Medications  Medication Sig Dispense Refill  . acyclovir (ZOVIRAX) 400 MG tablet Take 1 tablet (400 mg total) by mouth 2 (two) times daily. Start after completion of Valtrex (Patient not taking: Reported on 01/11/2020) 60 tablet 6  . amitriptyline (ELAVIL) 10 MG tablet Take 1 tablet (10 mg total) by mouth at bedtime. (Patient not taking: Reported on 01/11/2020) 20 tablet 0  . amoxicillin-clavulanate  (AUGMENTIN) 875-125 MG tablet Take 1 tablet by mouth every 12 (twelve) hours. (Patient not taking: Reported on 01/11/2020) 14 tablet 0  . gabapentin (NEURONTIN) 300 MG capsule Take 1 capsule (300 mg total) by mouth 2 (two) times daily. (Patient not taking: Reported on 01/11/2020) 60 capsule 0  . oxyCODONE-acetaminophen (PERCOCET/ROXICET) 5-325 MG tablet Take 1-2 tablets by mouth every 6 (six) hours as needed for severe pain. (Patient not taking: Reported on 01/11/2020) 50 tablet 0   No current facility-administered medications for this visit.   Facility-Administered Medications Ordered in Other Visits  Medication Dose Route Frequency Provider Last Rate Last Admin  . sodium chloride flush (NS) 0.9 % injection 10 mL  10 mL Intracatheter PRN Brunetta Genera, MD        PHYSICAL EXAMINATION: ECOG FS:1 - Symptomatic but completely ambulatory  There were no vitals filed for this visit. Wt Readings from Last 3 Encounters:  11/11/20 146 lb 8 oz (66.5 kg)  07/21/20 144 lb (65.3 kg)  06/27/20 144 lb 9.6 oz (65.6 kg)   There is no height or weight on file to calculate BMI.    *** GENERAL:alert, in no acute distress and comfortable SKIN: no acute rashes, no significant lesions EYES: conjunctiva are pink and non-injected, sclera anicteric OROPHARYNX: MMM, no exudates, no oropharyngeal erythema or ulceration NECK: supple, no JVD LYMPH:  no palpable lymphadenopathy in the cervical, axillary or inguinal regions LUNGS: clear to auscultation b/l with normal respiratory effort HEART: regular rate & rhythm ABDOMEN:  normoactive bowel sounds , non tender, not distended. No palpable hepatosplenomegaly.  Extremity: no pedal edema PSYCH: alert & oriented x 3 with fluent speech NEURO: no focal motor/sensory deficits  LABORATORY DATA:   I have reviewed the data as listed  . CBC Latest Ref Rng & Units 11/11/2020 08/22/2020 07/21/2020  WBC 4.0 - 10.5 K/uL 3.0(L) 3.7(L) 2.6(L)  Hemoglobin 12.0 - 15.0  g/dL 11.9(L) 12.1 11.4(L)  Hematocrit 36 - 46 % 35.3(L) 35.9(L) 33.9(L)  Platelets 150 - 400 K/uL 163 184 145(L)   . CBC    Component Value Date/Time   WBC 3.0 (L) 11/11/2020 0859   RBC 3.57 (L) 11/11/2020 0859   HGB 11.9 (L) 11/11/2020 0859   HGB 10.6 (L) 11/07/2018 1047   HGB 11.7 07/20/2017 1315   HCT 35.3 (L) 11/11/2020 0859   HCT 35.2 07/20/2017 1315   PLT 163 11/11/2020 0859   PLT 473 (H) 11/07/2018 1047   PLT 266 07/20/2017 1315   MCV 98.9 11/11/2020 0859   MCV 93.2 07/20/2017 1315   MCH 33.3 11/11/2020 0859   MCHC 33.7 11/11/2020 0859   RDW 11.7 11/11/2020 0859   RDW 14.5 07/20/2017 1315   LYMPHSABS 0.4 (L) 11/11/2020 0859   LYMPHSABS 0.3 (L) 07/20/2017 1315   MONOABS 0.6 11/11/2020 0859   MONOABS 0.5 07/20/2017 1315   EOSABS 0.1 11/11/2020 0859   EOSABS 0.2 07/20/2017 1315   BASOSABS 0.0 11/11/2020 0859   BASOSABS 0.0 07/20/2017 1315   . CMP Latest Ref Rng & Units 11/11/2020 08/22/2020 07/21/2020  Glucose 70 - 99 mg/dL  74 63(L) 83  BUN 6 - 20 mg/dL 8 10 8   Creatinine 0.44 - 1.00 mg/dL 0.77 0.80 0.82  Sodium 135 - 145 mmol/L 136 137 140  Potassium 3.5 - 5.1 mmol/L 3.9 4.2 3.8  Chloride 98 - 111 mmol/L 100 101 101  CO2 22 - 32 mmol/L 26 27 26   Calcium 8.9 - 10.3 mg/dL 9.0 10.0 9.6  Total Protein 6.5 - 8.1 g/dL 7.1 7.3 7.0  Total Bilirubin 0.3 - 1.2 mg/dL 1.1 0.8 0.5  Alkaline Phos 38 - 126 U/L 63 56 63  AST 15 - 41 U/L 51(H) 32 99(H)  ALT 0 - 44 U/L 34 26 83(H)     RADIOGRAPHIC STUDIES: I have personally reviewed the radiological images as listed and agreed with the findings in the report. No results found.  ASSESSMENT & PLAN:   34 y.o. 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 She was noted to have significant constitutional symptoms including significant  weight loss, fevers chills and some night sweats. HIV negative Hepatitis C and hepatitis B serologies negative. Echo with normal ejection fraction. Patient was treated with 5 cycles of AVD (Bleomycin held due to poor DLCO 37% and ongoing smoking). Multiple avoidable treatment delays due to the patient's noncompliance with follow-up for avoidable reasons. She has been counseled repeatedly that this would increase the likelihood of unfavorable outcome.  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 S/p 6 cycles of 2nd line treatment with Bendamustine/Brentuximab And 1 cycle of Bretuximab alone  -PET/CT scan results from 04/14/2017 were discussed in details. She appears to have some persistent disease in her right lung at [place]ville 5. It is difficult to say if this is recurrent or persistent disease since the patient failed to follow-up on multiple scheduled PET/CT scans in the early part and prior to her second line treatment.  Patient was lost to followup for >1 yr  09/26/18 PET/CT revealed Imaging findings compatible with recurrence of disease. 2. Multiple new large areas of hypermetabolic nodularity and airspace consolidation within both lungs which is presumed to represent pulmonary involvement by lymphoma. Deauville criteria 5. 3. New hypermetabolic left supraclavicular, left retroperitoneal, and bilateral pelvic lymph nodes. Deauville criteria 5. 4. Multifocal hypermetabolic osseous lesions. Deauville criteria 5. 5. Small volume of ascites, new.   12/06/18 PET/CT revealed Generally improved appearance with previously mostly Deauville 5 activity now mostly Deauville 4 activity. 2. Layering of much of the airspace opacity in the lungs, although considerable right perihilar airspace opacity remains. The remaining pulmonary opacities are assess this Deauville 5 on the right and over L4 on the left, and were previously Deauville 5 bilaterally. 3. Mildly reduced size and  moderately reduced activity in the abdominopelvic lymph nodes which are now dove L4. 4. The previously seen left supraclavicular lymph node seems to have completely resolved (Deauville 0). 5. The skeletal lesions remain at Deau[place] 4, although in absolute terms have decreased in SUV compared to previous. 6. No new regions of malignant involvement compared to prior exam. 7. Other imaging findings of potential clinical significance: Low-density blood pool suggests anemia. Pectus excavatum. Volume loss in the right hemithorax.  06/13/19 PET/CT revealed "No abnormal hypermetabolism (Deauville 1). 2. Mixed lytic and sclerotic osseous lesions appear more prominent than on 12/06/2018 but do not have associated hypermetabolism, indicative of interval healing." 01/31/2020 CT ABDOMEN PELVIS W CONTRAST (Accession [id number])  and CT CHEST W CONTRAST (Accession [id number]) revealed "1. Small pre pericardial lymph node, not present on the prior study. 2. Stable volume loss in the right chest with shift of mediastinal structures into the right chest. 3. No change in the appearance of multifocal bony sclerosis, without signs of FDG uptake on the previous exam, attention on follow-up."  #2 s/p hypoxic respiratory failurewith dense right lung consolidation and left upper lobe consolidation with SVC syndrome. Patient has completed palliative radiation to the right lung mass causing SVC compression.  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  #4 Non compliancewith clinic and treatment followup. Missed 2nd dose of bendamustine with C2. Has missed multiple appointment for her PET/CT and missed appointment at [facility] for consideration of Transplant.  Pt was lost to follow up after July 2018 and returned on 08/31/18 Discussed the patient's goals of  care and the pt noted that she is ready to begin treatment again and maintain compliance and follow ups   #5 h/o Grade 1 neuropathyfrom Brentuximab-Vedotin - resolved  #6 Shingles outbreak - curentlyresolved but with significant post herpetic neuralgia  #7 Significant anemia and thrombocytopenia after C1 of ICE -- will need close monitoring.  #8 Abnormal LFTs ? Related to Pembrolizumab - stable today  PLAN: *** -No overt lab or clinical evidence of CHL progression at this time.*** -The pt has no prohibitive toxicities from continuing Pembrolizumab every three weeks at this time.*** -Plan to repeat scans in 3-4 months. .   FOLLOW UP: ***   The total time spent in the appt was *** minutes and more than 50% was on counseling and direct patient cares.  All of the patient's questions were answered with apparent satisfaction. The patient knows to call the clinic with any problems, questions or concerns.   Sullivan Lone MD [place] AAHIVMS [facility] Hematology/Oncology Physician [facility]  (Office):       [phone] (Work cell):  [phone] (Fax):           [phone]  I, Yevette Edwards, am acting as a scribe for Dr. Sullivan Lone.   {Add Barista Statement}

## 2020-12-02 ENCOUNTER — Inpatient Hospital Stay: Payer: Medicaid Other

## 2020-12-02 ENCOUNTER — Inpatient Hospital Stay: Payer: Medicaid Other | Admitting: Hematology

## 2020-12-02 ENCOUNTER — Inpatient Hospital Stay: Payer: Medicaid Other | Attending: Hematology

## 2020-12-18 ENCOUNTER — Inpatient Hospital Stay: Payer: Medicaid Other

## 2021-01-01 ENCOUNTER — Telehealth: Payer: Self-pay | Admitting: Hematology

## 2021-01-01 NOTE — Telephone Encounter (Signed)
Called pt and confirmed 1/12 appts

## 2021-01-07 ENCOUNTER — Other Ambulatory Visit: Payer: Self-pay

## 2021-01-07 ENCOUNTER — Other Ambulatory Visit: Payer: Medicaid Other

## 2021-01-07 ENCOUNTER — Inpatient Hospital Stay (HOSPITAL_BASED_OUTPATIENT_CLINIC_OR_DEPARTMENT_OTHER): Payer: Medicaid Other | Admitting: Hematology

## 2021-01-07 ENCOUNTER — Inpatient Hospital Stay: Payer: Medicaid Other

## 2021-01-07 ENCOUNTER — Ambulatory Visit: Payer: Medicaid Other

## 2021-01-07 ENCOUNTER — Inpatient Hospital Stay: Payer: Medicaid Other | Attending: Hematology

## 2021-01-07 VITALS — BP 123/83 | HR 81 | Temp 97.7°F | Resp 13 | Ht 67.0 in | Wt 140.4 lb

## 2021-01-07 DIAGNOSIS — R918 Other nonspecific abnormal finding of lung field: Secondary | ICD-10-CM | POA: Diagnosis not present

## 2021-01-07 DIAGNOSIS — Q676 Pectus excavatum: Secondary | ICD-10-CM | POA: Diagnosis not present

## 2021-01-07 DIAGNOSIS — Z5112 Encounter for antineoplastic immunotherapy: Secondary | ICD-10-CM | POA: Insufficient documentation

## 2021-01-07 DIAGNOSIS — Z79899 Other long term (current) drug therapy: Secondary | ICD-10-CM | POA: Insufficient documentation

## 2021-01-07 DIAGNOSIS — G35 Multiple sclerosis: Secondary | ICD-10-CM | POA: Insufficient documentation

## 2021-01-07 DIAGNOSIS — R188 Other ascites: Secondary | ICD-10-CM | POA: Insufficient documentation

## 2021-01-07 DIAGNOSIS — D649 Anemia, unspecified: Secondary | ICD-10-CM | POA: Insufficient documentation

## 2021-01-07 DIAGNOSIS — B0229 Other postherpetic nervous system involvement: Secondary | ICD-10-CM | POA: Diagnosis not present

## 2021-01-07 DIAGNOSIS — C8128 Mixed cellularity classical Hodgkin lymphoma, lymph nodes of multiple sites: Secondary | ICD-10-CM | POA: Insufficient documentation

## 2021-01-07 DIAGNOSIS — Z23 Encounter for immunization: Secondary | ICD-10-CM | POA: Diagnosis not present

## 2021-01-07 DIAGNOSIS — R945 Abnormal results of liver function studies: Secondary | ICD-10-CM

## 2021-01-07 DIAGNOSIS — R61 Generalized hyperhidrosis: Secondary | ICD-10-CM | POA: Diagnosis not present

## 2021-01-07 DIAGNOSIS — R7989 Other specified abnormal findings of blood chemistry: Secondary | ICD-10-CM | POA: Insufficient documentation

## 2021-01-07 DIAGNOSIS — R634 Abnormal weight loss: Secondary | ICD-10-CM | POA: Insufficient documentation

## 2021-01-07 DIAGNOSIS — R6883 Chills (without fever): Secondary | ICD-10-CM | POA: Insufficient documentation

## 2021-01-07 DIAGNOSIS — C8198 Hodgkin lymphoma, unspecified, lymph nodes of multiple sites: Secondary | ICD-10-CM | POA: Diagnosis not present

## 2021-01-07 DIAGNOSIS — Z7189 Other specified counseling: Secondary | ICD-10-CM

## 2021-01-07 DIAGNOSIS — Z87891 Personal history of nicotine dependence: Secondary | ICD-10-CM | POA: Diagnosis not present

## 2021-01-07 LAB — CMP (CANCER CENTER ONLY)
ALT: 29 U/L (ref 0–44)
AST: 40 U/L (ref 15–41)
Albumin: 4.2 g/dL (ref 3.5–5.0)
Alkaline Phosphatase: 80 U/L (ref 38–126)
Anion gap: 11 (ref 5–15)
BUN: 6 mg/dL (ref 6–20)
CO2: 27 mmol/L (ref 22–32)
Calcium: 9.6 mg/dL (ref 8.9–10.3)
Chloride: 101 mmol/L (ref 98–111)
Creatinine: 0.84 mg/dL (ref 0.44–1.00)
GFR, Estimated: >60 mL/min (ref >60)
Glucose, Bld: 95 mg/dL (ref 70–99)
Potassium: 4 mmol/L (ref 3.5–5.1)
Sodium: 139 mmol/L (ref 135–145)
Total Bilirubin: 0.5 mg/dL (ref 0.3–1.2)
Total Protein: 7.9 g/dL (ref 6.5–8.1)

## 2021-01-07 LAB — CBC WITH DIFFERENTIAL/PLATELET
Abs Immature Granulocytes: 0 10*3/uL (ref 0.00–0.07)
Basophils Absolute: 0 10*3/uL (ref 0.0–0.1)
Basophils Relative: 0 %
Eosinophils Absolute: 0 10*3/uL (ref 0.0–0.5)
Eosinophils Relative: 2 %
HCT: 39.6 % (ref 36.0–46.0)
Hemoglobin: 13.1 g/dL (ref 12.0–15.0)
Immature Granulocytes: 0 %
Lymphocytes Relative: 22 %
Lymphs Abs: 0.5 10*3/uL — ABNORMAL LOW (ref 0.7–4.0)
MCH: 32.8 pg (ref 26.0–34.0)
MCHC: 33.1 g/dL (ref 30.0–36.0)
MCV: 99.2 fL (ref 80.0–100.0)
Monocytes Absolute: 0.4 10*3/uL (ref 0.1–1.0)
Monocytes Relative: 16 %
Neutro Abs: 1.4 10*3/uL — ABNORMAL LOW (ref 1.7–7.7)
Neutrophils Relative %: 60 %
Platelets: 183 10*3/uL (ref 150–400)
RBC: 3.99 MIL/uL (ref 3.87–5.11)
RDW: 11.9 % (ref 11.5–15.5)
WBC: 2.4 10*3/uL — ABNORMAL LOW (ref 4.0–10.5)
nRBC: 0 % (ref 0.0–0.2)

## 2021-01-07 MED ORDER — SODIUM CHLORIDE 0.9% FLUSH
10.0000 mL | INTRAVENOUS | Status: DC | PRN
  Administered 2021-01-07: 10 mL
  Filled 2021-01-07: qty 10

## 2021-01-07 MED ORDER — SODIUM CHLORIDE 0.9 % IV SOLN
Freq: Once | INTRAVENOUS | Status: AC
Start: 2021-01-07 — End: 2021-01-07
  Filled 2021-01-07: qty 250

## 2021-01-07 MED ORDER — DIPHENHYDRAMINE HCL 25 MG PO CAPS
ORAL_CAPSULE | ORAL | Status: AC
  Filled 2021-01-07: qty 1

## 2021-01-07 MED ORDER — FAMOTIDINE 20 MG PO TABS
40.0000 mg | ORAL_TABLET | Freq: Once | ORAL | Status: AC
  Administered 2021-01-07: 40 mg via ORAL

## 2021-01-07 MED ORDER — ACETAMINOPHEN 325 MG PO TABS
ORAL_TABLET | ORAL | Status: AC
  Filled 2021-01-07: qty 2

## 2021-01-07 MED ORDER — ACETAMINOPHEN 325 MG PO TABS
650.0000 mg | ORAL_TABLET | Freq: Once | ORAL | Status: AC
  Administered 2021-01-07: 650 mg via ORAL

## 2021-01-07 MED ORDER — SODIUM CHLORIDE 0.9 % IV SOLN
200.0000 mg | Freq: Once | INTRAVENOUS | Status: AC
  Administered 2021-01-07: 200 mg via INTRAVENOUS
  Filled 2021-01-07: qty 8

## 2021-01-07 MED ORDER — HEPARIN SOD (PORK) LOCK FLUSH 100 UNIT/ML IV SOLN
500.0000 [IU] | Freq: Once | INTRAVENOUS | Status: AC | PRN
Start: 2021-01-07 — End: 2021-01-07
  Administered 2021-01-07: 500 [IU]
  Filled 2021-01-07: qty 5

## 2021-01-07 MED ORDER — FAMOTIDINE 20 MG PO TABS
ORAL_TABLET | ORAL | Status: AC
  Filled 2021-01-07: qty 2

## 2021-01-07 MED ORDER — DIPHENHYDRAMINE HCL 25 MG PO TABS
25.0000 mg | ORAL_TABLET | Freq: Once | ORAL | Status: AC
  Administered 2021-01-07: 25 mg via ORAL
  Filled 2021-01-07: qty 1

## 2021-01-07 NOTE — Progress Notes (Signed)
HEMATOLOGY ONCOLOGY PROGRESS NOTE  Date of service:   01/07/21     Patient Care Team: Patient, No Pcp Per as PCP - General (General Practice)  Chief complaint: Follow-up for Hodgkin's lymphoma  Diagnosis:   Refractory Mixed cellularity Hodgkin's lymphoma IVBE with extensive lymphadenopathy including right axillary, mediastinal and upper retroperitoneal and now biopsy proven pulmonary involvement. She was noted to have significant constitutional symptoms including significant weight loss, fevers chills and some night sweats.  Current Treatment:  Pembrolizumab- 4th line. Patient declined HDT/Auto HSCT or consideration of Car-T cell therapy.  Previous treatment  5 cycles of AVD (without bleomycin due to lung involvement and DLCO of 37%, active smoker) Multiple avoidable treatment delays due to the patient's noncompliance with follow-up for avoidable reasons. She has been counseled repeatedly that this would increase the likelihood of unfavorable outcome.  2nd line therapy with Bendamustine + Brentuximab s/p 7 cycles.  3rd line therapy with ICE s/p 2 cycles   INTERVAL HISTORY:  Heidi [name] is here for follow-up for her Hodgkins lymphoma for a very delayed C25D1 Pembrolizumab treatment. The patient's last visit with [place] was on 11/11/2020. The pt reports that she is doing physical well and has no acute new sypmtoms.  The pt reports the father of her older son recently passed away and she has been having a lot of emotional stress leading to missed treatments and f/u.  Lab results today (01/07/21) of CBC w/diff and CMP reviewed with patient  On review of systems, pt reports no new LNadenopathy, no fevers/chills/nightsweats or weight loss.  REVIEW OF SYSTEMS:  A 10+ POINT REVIEW OF SYSTEMS WAS OBTAINED including neurology, dermatology, psychiatry, cardiac, respiratory, lymph, extremities, GI, GU, Musculoskeletal, constitutional, breasts, reproductive, HEENT.  All pertinent positives  are noted in the HPI.  All others are negative.   Past Medical History:  Diagnosis Date  . ARDS (adult respiratory distress syndrome) (HCC)   . Asthma   . Hodgkin lymphoma (HCC)   . Hypertension     . Past Surgical History:  Procedure Laterality Date  . AXILLARY LYMPH NODE BIOPSY Right 03/19/2016   Procedure: AXILLARY LYMPH NODE BIOPSY;  Surgeon: Darnell Level, MD;  Location: [facility];  Service: General;  Laterality: Right;  . IR GENERIC HISTORICAL  12/22/2016   IR FLUORO GUIDE PORT INSERTION LEFT 12/22/2016 [facility]  . IR GENERIC HISTORICAL  12/22/2016   IR US GUIDE VASC ACCESS LEFT 12/22/2016 [facility]  . IR GENERIC HISTORICAL  12/22/2016   IR CV LINE INJECTION 12/22/2016 [facility]  . IR GENERIC HISTORICAL  12/22/2016   IR REMOVAL TUN ACCESS W/ PORT W/O FL MOD SED 12/22/2016 [facility]  . VIDEO BRONCHOSCOPY Bilateral 11/26/2016   Procedure: VIDEO BRONCHOSCOPY WITH FLUORO;  Surgeon: Oretha Milch, MD;  Location: [facility];  Service: Cardiopulmonary;  Laterality: Bilateral;    . Social History   Tobacco Use  . Smoking status: Former Smoker    Packs/day: 0.50    Years: 15.00    Pack years: 7.50    Types: Cigarettes    Quit date: 03/27/2016    Years since quitting: 4.7  . Smokeless tobacco: Never Used  Vaping Use  . Vaping Use: Never used  Substance Use Topics  . Alcohol use: Yes    Comment: occasional now    ALLERGIES:  has No Known Allergies.  MEDICATIONS:  Current Outpatient Medications  Medication Sig Dispense Refill  . acyclovir (ZOVIRAX) 400 MG tablet Take 1 tablet (400 mg total) by  mouth 2 (two) times daily. Start after completion of Valtrex (Patient not taking: Reported on 01/11/2020) 60 tablet 6  . amitriptyline (ELAVIL) 10 MG tablet Take 1 tablet (10 mg total) by mouth at bedtime. (Patient not taking: Reported on 01/11/2020) 20 tablet 0  . amoxicillin-clavulanate (AUGMENTIN) 875-125 MG tablet Take 1 tablet by mouth every 12 (twelve) hours.  (Patient not taking: Reported on 01/11/2020) 14 tablet 0  . gabapentin (NEURONTIN) 300 MG capsule Take 1 capsule (300 mg total) by mouth 2 (two) times daily. (Patient not taking: Reported on 01/11/2020) 60 capsule 0  . oxyCODONE-acetaminophen (PERCOCET/ROXICET) 5-325 MG tablet Take 1-2 tablets by mouth every 6 (six) hours as needed for severe pain. (Patient not taking: Reported on 01/11/2020) 50 tablet 0   No current facility-administered medications for this visit.   Facility-Administered Medications Ordered in Other Visits  Medication Dose Route Frequency Provider Last Rate Last Admin  . sodium chloride flush (NS) 0.9 % injection 10 mL  10 mL Intracatheter PRN [name], MD        PHYSICAL EXAMINATION: ECOG FS:1 - Symptomatic but completely ambulatory  There were no vitals filed for this visit. Wt Readings from Last 3 Encounters:  11/11/20 146 lb 8 oz (66.5 kg)  07/21/20 144 lb (65.3 kg)  06/27/20 144 lb 9.6 oz (65.6 kg)   There is no height or weight on file to calculate BMI.    NAD GENERAL:alert, in no acute distress and comfortable SKIN: no acute rashes, no significant lesions EYES: conjunctiva are pink and non-injected, sclera anicteric OROPHARYNX: MMM, no exudates, no oropharyngeal erythema or ulceration NECK: supple, no JVD LYMPH:  no palpable lymphadenopathy in the cervical, axillary or inguinal regions LUNGS: clear to auscultation b/l with normal respiratory effort HEART: regular rate & rhythm ABDOMEN:  normoactive bowel sounds , non tender, not distended. No palpable hepatosplenomegaly.  Extremity: no pedal edema PSYCH: alert & oriented x 3 with fluent speech NEURO: no focal motor/sensory deficits  LABORATORY DATA:   I have reviewed the data as listed  . CBC Latest Ref Rng & Units 01/07/2021 11/11/2020 08/22/2020  WBC 4.0 - 10.5 K/uL 2.4(L) 3.0(L) 3.7(L)  Hemoglobin 12.0 - 15.0 g/dL 93.2 11.9(L) 12.1  Hematocrit 36.0 - 46.0 % 39.6 35.3(L) 35.9(L)   Platelets 150 - 400 K/uL 183 163 184   . CBC    Component Value Date/Time   WBC 2.4 (L) 01/07/2021 1203   RBC 3.99 01/07/2021 1203   HGB 13.1 01/07/2021 1203   HGB 10.6 (L) 11/07/2018 1047   HGB 11.7 07/20/2017 1315   HCT 39.6 01/07/2021 1203   HCT 35.2 07/20/2017 1315   PLT 183 01/07/2021 1203   PLT 473 (H) 11/07/2018 1047   PLT 266 07/20/2017 1315   MCV 99.2 01/07/2021 1203   MCV 93.2 07/20/2017 1315   MCH 32.8 01/07/2021 1203   MCHC 33.1 01/07/2021 1203   RDW 11.9 01/07/2021 1203   RDW 14.5 07/20/2017 1315   LYMPHSABS 0.5 (L) 01/07/2021 1203   LYMPHSABS 0.3 (L) 07/20/2017 1315   MONOABS 0.4 01/07/2021 1203   MONOABS 0.5 07/20/2017 1315   EOSABS 0.0 01/07/2021 1203   EOSABS 0.2 07/20/2017 1315   BASOSABS 0.0 01/07/2021 1203   BASOSABS 0.0 07/20/2017 1315   . CMP Latest Ref Rng & Units 01/07/2021 11/11/2020 08/22/2020  Glucose 70 - 99 mg/dL 95 74 67(T)  BUN 6 - 20 mg/dL 6 8 10   Creatinine 0.44 - 1.00 mg/dL 2.45 8.09 9.83  Sodium 135 -  145 mmol/L 139 136 137  Potassium 3.5 - 5.1 mmol/L 4.0 3.9 4.2  Chloride 98 - 111 mmol/L 101 100 101  CO2 22 - 32 mmol/L 27 26 27   Calcium 8.9 - 10.3 mg/dL 9.6 9.0 10.0  Total Protein 6.5 - 8.1 g/dL 7.9 7.1 7.3  Total Bilirubin 0.3 - 1.2 mg/dL 0.5 1.1 0.8  Alkaline Phos 38 - 126 U/L 80 63 56  AST 15 - 41 U/L 40 51(H) 32  ALT 0 - 44 U/L 29 34 26     RADIOGRAPHIC STUDIES: I have personally reviewed the radiological images as listed and agreed with the findings in the report. No results found.  ASSESSMENT & PLAN:   35 y.o. 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 She was noted to have significant constitutional symptoms including significant weight loss, fevers chills and some night sweats. HIV negative Hepatitis C and hepatitis B  serologies negative. Echo with normal ejection fraction. Patient was treated with 5 cycles of AVD (Bleomycin held due to poor DLCO 37% and ongoing smoking). Multiple avoidable treatment delays due to the patient's noncompliance with follow-up for avoidable reasons. She has been counseled repeatedly that this would increase the likelihood of unfavorable outcome.  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 S/p 6 cycles of 2nd line treatment with Bendamustine/Brentuximab And 1 cycle of Bretuximab alone  -PET/CT scan results from 04/14/2017 were discussed in details. She appears to have some persistent disease in her right lung at Deauville 5. It is difficult to say if this is recurrent or persistent disease since the patient failed to follow-up on multiple scheduled PET/CT scans in the early part and prior to her second line treatment.  Patient was lost to followup for >1 yr  09/26/18 PET/CT revealed Imaging findings compatible with recurrence of disease. 2. Multiple new large areas of hypermetabolic nodularity and airspace consolidation within both lungs which is presumed to represent pulmonary involvement by lymphoma. Deauville criteria 5. 3. New hypermetabolic left supraclavicular, left retroperitoneal, and bilateral pelvic lymph nodes. Deauville criteria 5. 4. Multifocal hypermetabolic osseous lesions. Deauville criteria 5. 5. Small volume of ascites, new.   12/06/18 PET/CT revealed Generally improved appearance with previously mostly Deauville 5 activity now mostly Deauville 4 activity. 2. Layering of much of the airspace opacity in the lungs, although considerable right perihilar airspace opacity remains. The remaining pulmonary opacities are assess this Deauville 5 on the right and over L4 on the left, and were previously Deauville 5 bilaterally. 3. Mildly reduced size and moderately reduced activity in the abdominopelvic lymph nodes which are now dove L4. 4. The  previously seen left supraclavicular lymph node seems to have completely resolved (Deauville 0). 5. The skeletal lesions remain at Deauville 4, although in absolute terms have decreased in SUV compared to previous. 6. No new regions of malignant involvement compared to prior exam. 7. Other imaging findings of potential clinical significance: Low-density blood pool suggests anemia. Pectus excavatum. Volume loss in the right hemithorax.  06/13/19 PET/CT revealed "No abnormal hypermetabolism (Deauville 1). 2. Mixed lytic and sclerotic osseous lesions appear more prominent than on 12/06/2018 but do not have associated hypermetabolism, indicative of interval healing." 01/31/2020 CT ABDOMEN PELVIS W CONTRAST (Accession [id number]) and CT CHEST W CONTRAST (Accession [id number]) revealed "1. Small pre pericardial lymph node, not present on the prior study. 2. Stable volume loss in  the right chest with shift of mediastinal structures into the right chest. 3. No change in the appearance of multifocal bony sclerosis, without signs of FDG uptake on the previous exam, attention on follow-up."  #2 s/p hypoxic respiratory failurewith dense right lung consolidation and left upper lobe consolidation with SVC syndrome. Patient has completed palliative radiation to the right lung mass causing SVC compression.  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  #4 Non compliancewith clinic and treatment followup. Missed 2nd dose of bendamustine with C2. Has missed multiple appointment for her PET/CT and missed appointment at [facility] for consideration of Transplant.  Pt was lost to follow up after July 2018 and returned on 08/31/18 Discussed the patient's goals of care and the pt noted that she is ready to begin treatment again and maintain compliance  and follow ups   #5 h/o Grade 1 neuropathyfrom Brentuximab-Vedotin - resolved  #6 Shingles outbreak - curentlyresolved but with significant post herpetic neuralgia  #7 Significant anemia and thrombocytopenia after C1 of ICE -- will need close monitoring.  #8 Abnormal LFTs ? Related to Pembrolizumab - stable today  PLAN: -labs stable -patient counseled on importance of avoiding missing her treatments. -No overt lab or clinical evidence of CHL progression at this time. -The pt has no prohibitive toxicities from continuing Pembrolizumab every three weeks at this time.    FOLLOW UP:  -Covid vaccine 2nd dose today -Plz schedule next 4 doses of Pembrolizumab q3weeks -Portflush and labs with each treatment -CT chest/abd/pelvis in 5 weeks -MD visit in 6 weeks      The total time spent in the appt was 30 minutes and more than 50% was on counseling and direct patient cares, ordering and mx of immunotherapy  All of the patient's questions were answered with apparent satisfaction. The patient knows to call the clinic with any problems, questions or concerns.   Sullivan Lone MD [place] AAHIVMS [facility] Hematology/Oncology Physician [facility]  (Office):       [phone] (Work cell):  [phone] (Fax):           [phone]  I, Yevette Edwards, am acting as a scribe for Dr. Sullivan Lone.   .I have reviewed the above documentation for accuracy and completeness, and I agree with the above. Brunetta Genera MD

## 2021-01-07 NOTE — Progress Notes (Signed)
Ok to treat with ANC of 1.4 per MD Irene Limbo

## 2021-01-07 NOTE — Patient Instructions (Signed)
[facility] Discharge Instructions for Patients Receiving Chemotherapy  Today you received the following chemotherapy agent: Pembrolizumab.  To help prevent nausea and vomiting after your treatment, we encourage you to take your nausea medication as directed.   If you develop nausea and vomiting that is not controlled by your nausea medication, call the clinic.   BELOW ARE SYMPTOMS THAT SHOULD BE REPORTED IMMEDIATELY:  *FEVER GREATER THAN 100.5 F  *CHILLS WITH OR WITHOUT FEVER  NAUSEA AND VOMITING THAT IS NOT CONTROLLED WITH YOUR NAUSEA MEDICATION  *UNUSUAL SHORTNESS OF BREATH  *UNUSUAL BRUISING OR BLEEDING  TENDERNESS IN MOUTH AND THROAT WITH OR WITHOUT PRESENCE OF ULCERS  *URINARY PROBLEMS  *BOWEL PROBLEMS  UNUSUAL RASH Items with * indicate a potential emergency and should be followed up as soon as possible.  Feel free to call the clinic should you have any questions or concerns. The clinic phone number is [phone].  Please show the CHEMO ALERT CARD at check-in to the Emergency Department and triage nurse.   

## 2021-01-07 NOTE — Progress Notes (Signed)
Pt stayed for 15 min post injection, tolerated well.

## 2021-01-28 ENCOUNTER — Inpatient Hospital Stay: Payer: Medicaid Other | Attending: Hematology

## 2021-01-28 ENCOUNTER — Inpatient Hospital Stay: Payer: Medicaid Other

## 2021-01-28 DIAGNOSIS — Q676 Pectus excavatum: Secondary | ICD-10-CM | POA: Insufficient documentation

## 2021-01-28 DIAGNOSIS — Z5112 Encounter for antineoplastic immunotherapy: Secondary | ICD-10-CM | POA: Insufficient documentation

## 2021-01-28 DIAGNOSIS — D649 Anemia, unspecified: Secondary | ICD-10-CM | POA: Insufficient documentation

## 2021-01-28 DIAGNOSIS — I700 Atherosclerosis of aorta: Secondary | ICD-10-CM | POA: Insufficient documentation

## 2021-01-28 DIAGNOSIS — R6883 Chills (without fever): Secondary | ICD-10-CM | POA: Insufficient documentation

## 2021-01-28 DIAGNOSIS — G35 Multiple sclerosis: Secondary | ICD-10-CM | POA: Insufficient documentation

## 2021-01-28 DIAGNOSIS — J439 Emphysema, unspecified: Secondary | ICD-10-CM | POA: Insufficient documentation

## 2021-01-28 DIAGNOSIS — Z79899 Other long term (current) drug therapy: Secondary | ICD-10-CM | POA: Insufficient documentation

## 2021-01-28 DIAGNOSIS — R61 Generalized hyperhidrosis: Secondary | ICD-10-CM | POA: Insufficient documentation

## 2021-01-28 DIAGNOSIS — Z9119 Patient's noncompliance with other medical treatment and regimen: Secondary | ICD-10-CM | POA: Insufficient documentation

## 2021-01-28 DIAGNOSIS — Z87891 Personal history of nicotine dependence: Secondary | ICD-10-CM | POA: Insufficient documentation

## 2021-01-28 DIAGNOSIS — C8128 Mixed cellularity classical Hodgkin lymphoma, lymph nodes of multiple sites: Secondary | ICD-10-CM | POA: Insufficient documentation

## 2021-01-28 DIAGNOSIS — R634 Abnormal weight loss: Secondary | ICD-10-CM | POA: Insufficient documentation

## 2021-01-28 DIAGNOSIS — R188 Other ascites: Secondary | ICD-10-CM | POA: Insufficient documentation

## 2021-01-28 DIAGNOSIS — Z9114 Patient's other noncompliance with medication regimen: Secondary | ICD-10-CM | POA: Insufficient documentation

## 2021-01-28 NOTE — Progress Notes (Signed)
Pt did not show for appts today. Message sent to scheduling team to reschedule.

## 2021-02-13 ENCOUNTER — Other Ambulatory Visit: Payer: Self-pay | Admitting: *Deleted

## 2021-02-13 ENCOUNTER — Ambulatory Visit (HOSPITAL_COMMUNITY): Payer: Medicaid Other

## 2021-02-13 DIAGNOSIS — C8198 Hodgkin lymphoma, unspecified, lymph nodes of multiple sites: Secondary | ICD-10-CM

## 2021-02-17 ENCOUNTER — Other Ambulatory Visit: Payer: Self-pay

## 2021-02-17 ENCOUNTER — Ambulatory Visit (HOSPITAL_COMMUNITY)
Admission: RE | Admit: 2021-02-17 | Discharge: 2021-02-17 | Disposition: A | Discharge status: Home / Self Care | Payer: Medicaid Other | Source: Ambulatory Visit | Attending: Hematology | Admitting: Hematology

## 2021-02-17 ENCOUNTER — Encounter (HOSPITAL_COMMUNITY): Payer: Self-pay

## 2021-02-17 ENCOUNTER — Inpatient Hospital Stay: Payer: Medicaid Other

## 2021-02-17 DIAGNOSIS — D649 Anemia, unspecified: Secondary | ICD-10-CM | POA: Diagnosis not present

## 2021-02-17 DIAGNOSIS — Q676 Pectus excavatum: Secondary | ICD-10-CM | POA: Diagnosis not present

## 2021-02-17 DIAGNOSIS — Z87891 Personal history of nicotine dependence: Secondary | ICD-10-CM | POA: Diagnosis not present

## 2021-02-17 DIAGNOSIS — C8198 Hodgkin lymphoma, unspecified, lymph nodes of multiple sites: Secondary | ICD-10-CM

## 2021-02-17 DIAGNOSIS — R188 Other ascites: Secondary | ICD-10-CM | POA: Diagnosis not present

## 2021-02-17 DIAGNOSIS — G35 Multiple sclerosis: Secondary | ICD-10-CM | POA: Diagnosis not present

## 2021-02-17 DIAGNOSIS — I700 Atherosclerosis of aorta: Secondary | ICD-10-CM | POA: Diagnosis not present

## 2021-02-17 DIAGNOSIS — C8128 Mixed cellularity classical Hodgkin lymphoma, lymph nodes of multiple sites: Secondary | ICD-10-CM | POA: Diagnosis present

## 2021-02-17 DIAGNOSIS — Z5112 Encounter for antineoplastic immunotherapy: Secondary | ICD-10-CM

## 2021-02-17 DIAGNOSIS — R634 Abnormal weight loss: Secondary | ICD-10-CM | POA: Diagnosis not present

## 2021-02-17 DIAGNOSIS — Z9119 Patient's noncompliance with other medical treatment and regimen: Secondary | ICD-10-CM | POA: Diagnosis not present

## 2021-02-17 DIAGNOSIS — J439 Emphysema, unspecified: Secondary | ICD-10-CM | POA: Diagnosis not present

## 2021-02-17 DIAGNOSIS — Z7189 Other specified counseling: Secondary | ICD-10-CM

## 2021-02-17 DIAGNOSIS — R61 Generalized hyperhidrosis: Secondary | ICD-10-CM | POA: Diagnosis not present

## 2021-02-17 DIAGNOSIS — Z9114 Patient's other noncompliance with medication regimen: Secondary | ICD-10-CM | POA: Diagnosis not present

## 2021-02-17 DIAGNOSIS — R6883 Chills (without fever): Secondary | ICD-10-CM | POA: Diagnosis not present

## 2021-02-17 DIAGNOSIS — Z79899 Other long term (current) drug therapy: Secondary | ICD-10-CM | POA: Diagnosis not present

## 2021-02-17 DIAGNOSIS — Z95828 Presence of other vascular implants and grafts: Secondary | ICD-10-CM

## 2021-02-17 LAB — CMP (CANCER CENTER ONLY)
ALT: 24 U/L (ref 0–44)
AST: 29 U/L (ref 15–41)
Albumin: 4 g/dL (ref 3.5–5.0)
Alkaline Phosphatase: 59 U/L (ref 38–126)
Anion gap: 9 (ref 5–15)
BUN: 10 mg/dL (ref 6–20)
CO2: 27 mmol/L (ref 22–32)
Calcium: 8.8 mg/dL — ABNORMAL LOW (ref 8.9–10.3)
Chloride: 102 mmol/L (ref 98–111)
Creatinine: 0.97 mg/dL (ref 0.44–1.00)
GFR, Estimated: >60 mL/min (ref >60)
Glucose, Bld: 84 mg/dL (ref 70–99)
Potassium: 4.3 mmol/L (ref 3.5–5.1)
Sodium: 138 mmol/L (ref 135–145)
Total Bilirubin: 0.5 mg/dL (ref 0.3–1.2)
Total Protein: 7 g/dL (ref 6.5–8.1)

## 2021-02-17 LAB — CBC WITH DIFFERENTIAL/PLATELET
Abs Immature Granulocytes: 0.01 10*3/uL (ref 0.00–0.07)
Basophils Absolute: 0 10*3/uL (ref 0.0–0.1)
Basophils Relative: 0 %
Eosinophils Absolute: 0.1 10*3/uL (ref 0.0–0.5)
Eosinophils Relative: 2 %
HCT: 33.7 % — ABNORMAL LOW (ref 36.0–46.0)
Hemoglobin: 11.2 g/dL — ABNORMAL LOW (ref 12.0–15.0)
Immature Granulocytes: 0 %
Lymphocytes Relative: 12 %
Lymphs Abs: 0.5 10*3/uL — ABNORMAL LOW (ref 0.7–4.0)
MCH: 33 pg (ref 26.0–34.0)
MCHC: 33.2 g/dL (ref 30.0–36.0)
MCV: 99.4 fL (ref 80.0–100.0)
Monocytes Absolute: 0.6 10*3/uL (ref 0.1–1.0)
Monocytes Relative: 16 %
Neutro Abs: 2.7 10*3/uL (ref 1.7–7.7)
Neutrophils Relative %: 70 %
Platelets: 163 10*3/uL (ref 150–400)
RBC: 3.39 MIL/uL — ABNORMAL LOW (ref 3.87–5.11)
RDW: 12.6 % (ref 11.5–15.5)
WBC: 3.9 10*3/uL — ABNORMAL LOW (ref 4.0–10.5)
nRBC: 0 % (ref 0.0–0.2)

## 2021-02-17 LAB — SEDIMENTATION RATE: Sed Rate: 8 mm/hr (ref 0–22)

## 2021-02-17 MED ORDER — HEPARIN SOD (PORK) LOCK FLUSH 100 UNIT/ML IV SOLN
INTRAVENOUS | Status: AC
  Filled 2021-02-17: qty 5

## 2021-02-17 MED ORDER — SODIUM CHLORIDE 0.9% FLUSH
10.0000 mL | INTRAVENOUS | Status: DC | PRN
  Administered 2021-02-17: 10 mL
  Filled 2021-02-17: qty 10

## 2021-02-17 MED ORDER — HEPARIN SOD (PORK) LOCK FLUSH 100 UNIT/ML IV SOLN
500.0000 [IU] | Freq: Once | INTRAVENOUS | Status: AC
  Administered 2021-02-17: 500 [IU] via INTRAVENOUS

## 2021-02-17 MED ORDER — IOHEXOL 300 MG/ML  SOLN
100.0000 mL | Freq: Once | INTRAMUSCULAR | Status: AC | PRN
  Administered 2021-02-17: 100 mL via INTRAVENOUS

## 2021-02-17 NOTE — Progress Notes (Signed)
HEMATOLOGY ONCOLOGY PROGRESS NOTE  Date of service:   02/18/21     Patient Care Team: Patient, No Pcp Per as PCP - General (General Practice)  Chief complaint: Follow-up for Hodgkin's lymphoma  Diagnosis:   Refractory Mixed cellularity Hodgkin's lymphoma IVBE with extensive lymphadenopathy including right axillary, mediastinal and upper retroperitoneal and now biopsy proven pulmonary involvement. She was noted to have significant constitutional symptoms including significant weight loss, fevers chills and some night sweats.  Current Treatment:  Pembrolizumab- 4th line. Patient declined HDT/Auto HSCT or consideration of Car-T cell therapy.  Previous treatment  5 cycles of AVD (without bleomycin due to lung involvement and DLCO of 37%, active smoker) Multiple avoidable treatment delays due to the patient's noncompliance with follow-up for avoidable reasons. She has been counseled repeatedly that this would increase the likelihood of unfavorable outcome.  2nd line therapy with Bendamustine + Brentuximab s/p 7 cycles.  3rd line therapy with ICE s/p 2 cycles   INTERVAL HISTORY: Ms Pinkham is here for follow-up for her Hodgkins lymphoma for a very delayed Pembrolizumab treatment. The patient's last visit with [place] was on 01/06/2021. The pt reports that she is doing physical well and has no acute new sypmtoms.  The pt reports no new concerns or symptoms. The pt is doing well at this time and continuing to recover from personal and emotional trauma and stress. The pt notes she is currently smoking up to 5 cigarettes daily and is continuing to try to work towards smoking cessation.  Of note since the patient's last visit, pt has had CT Chest Abd Pel w contrast on 02/17/2021.  Lab results 02/17/2021 of CBC w/diff and CMP is as follows: all values are WNL except for WBC of 3.9K, RBC of 3.39, Hgb of 11.2, HCT of 33.7, Lymphs Abs of 0.5K, Calcium of 8.8. 02/17/2021 Sedimentation Rate of  8.  On review of systems, pt denies SOB, decreased appetite, fevers, chills, night sweats, unexpected weight loss, back pain, abdominal pain, leg swelling, and any other symptoms.  REVIEW OF SYSTEMS:  10 Point review of Systems was done is negative except as noted above.  Past Medical History:  Diagnosis Date  . ARDS (adult respiratory distress syndrome) ([place])   . Asthma   . Hodgkin lymphoma ([place])   . Hypertension     . Past Surgical History:  Procedure Laterality Date  . AXILLARY LYMPH NODE BIOPSY Right 03/19/2016   Procedure: AXILLARY LYMPH NODE BIOPSY;  Surgeon: Armandina Gemma, MD;  Location: [facility];  Service: General;  Laterality: Right;  . IR GENERIC HISTORICAL  12/22/2016   IR FLUORO GUIDE PORT INSERTION LEFT 12/22/2016 [facility]  . IR GENERIC HISTORICAL  12/22/2016   IR US GUIDE VASC ACCESS LEFT 12/22/2016 [facility]  . IR GENERIC HISTORICAL  12/22/2016   IR CV LINE INJECTION 12/22/2016 [facility]  . IR GENERIC HISTORICAL  12/22/2016   IR REMOVAL TUN ACCESS W/ PORT W/O FL MOD SED 12/22/2016 [facility]  . VIDEO BRONCHOSCOPY Bilateral 11/26/2016   Procedure: VIDEO BRONCHOSCOPY WITH FLUORO;  Surgeon: Rigoberto Noel, MD;  Location: [facility];  Service: Cardiopulmonary;  Laterality: Bilateral;    . Social History   Tobacco Use  . Smoking status: Former Smoker    Packs/day: 0.50    Years: 15.00    Pack years: 7.50    Types: Cigarettes    Quit date: 03/27/2016    Years since quitting: 4.9  . Smokeless tobacco: Never Used  Vaping Use  .  Vaping Use: Never used  Substance Use Topics  . Alcohol use: Yes    Comment: occasional now    ALLERGIES:  has No Known Allergies.  MEDICATIONS:  Current Outpatient Medications  Medication Sig Dispense Refill  . acyclovir (ZOVIRAX) 400 MG tablet Take 1 tablet (400 mg total) by mouth 2 (two) times daily. Start after completion of Valtrex (Patient not taking: Reported on 01/11/2020) 60 tablet 6  . amitriptyline (ELAVIL) 10  MG tablet Take 1 tablet (10 mg total) by mouth at bedtime. (Patient not taking: Reported on 01/11/2020) 20 tablet 0  . amoxicillin-clavulanate (AUGMENTIN) 875-125 MG tablet Take 1 tablet by mouth every 12 (twelve) hours. (Patient not taking: Reported on 01/11/2020) 14 tablet 0  . gabapentin (NEURONTIN) 300 MG capsule Take 1 capsule (300 mg total) by mouth 2 (two) times daily. (Patient not taking: Reported on 01/11/2020) 60 capsule 0  . oxyCODONE-acetaminophen (PERCOCET/ROXICET) 5-325 MG tablet Take 1-2 tablets by mouth every 6 (six) hours as needed for severe pain. (Patient not taking: Reported on 01/11/2020) 50 tablet 0   No current facility-administered medications for this visit.   Facility-Administered Medications Ordered in Other Visits  Medication Dose Route Frequency Provider Last Rate Last Admin  . sodium chloride flush (NS) 0.9 % injection 10 mL  10 mL Intracatheter PRN Brunetta Genera, MD        PHYSICAL EXAMINATION: ECOG FS:1 - Symptomatic but completely ambulatory  Vitals:   02/18/21 1005  BP: 113/70  Pulse: 95  Resp: 20  Temp: (!) 97.2 F (36.2 C)  SpO2: 99%   Wt Readings from Last 3 Encounters:  02/18/21 146 lb 14.4 oz (66.6 kg)  01/07/21 140 lb 6.4 oz (63.7 kg)  11/11/20 146 lb 8 oz (66.5 kg)   Body mass index is 23.01 kg/m.    GENERAL:alert, in no acute distress and comfortable SKIN: no acute rashes, no significant lesions EYES: conjunctiva are pink and non-injected, sclera anicteric OROPHARYNX: MMM, no exudates, no oropharyngeal erythema or ulceration NECK: supple, no JVD LYMPH:  no palpable lymphadenopathy in the cervical, axillary or inguinal regions LUNGS: clear to auscultation b/l with normal respiratory effort HEART: regular rate & rhythm ABDOMEN:  normoactive bowel sounds , non tender, not distended. Extremity: no pedal edema PSYCH: alert & oriented x 3 with fluent speech NEURO: no focal motor/sensory deficits   LABORATORY DATA:   I have  reviewed the data as listed  CBC Latest Ref Rng & Units 02/17/2021 01/07/2021 11/11/2020  WBC 4.0 - 10.5 K/uL 3.9(L) 2.4(L) 3.0(L)  Hemoglobin 12.0 - 15.0 g/dL 11.2(L) 13.1 11.9(L)  Hematocrit 36.0 - 46.0 % 33.7(L) 39.6 35.3(L)  Platelets 150 - 400 K/uL 163 183 163   . CBC    Component Value Date/Time   WBC 3.9 (L) 02/17/2021 1351   RBC 3.39 (L) 02/17/2021 1351   HGB 11.2 (L) 02/17/2021 1351   HGB 10.6 (L) 11/07/2018 1047   HGB 11.7 07/20/2017 1315   HCT 33.7 (L) 02/17/2021 1351   HCT 35.2 07/20/2017 1315   PLT 163 02/17/2021 1351   PLT 473 (H) 11/07/2018 1047   PLT 266 07/20/2017 1315   MCV 99.4 02/17/2021 1351   MCV 93.2 07/20/2017 1315   MCH 33.0 02/17/2021 1351   MCHC 33.2 02/17/2021 1351   RDW 12.6 02/17/2021 1351   RDW 14.5 07/20/2017 1315   LYMPHSABS 0.5 (L) 02/17/2021 1351   LYMPHSABS 0.3 (L) 07/20/2017 1315   MONOABS 0.6 02/17/2021 1351   MONOABS 0.5 07/20/2017 1315  EOSABS 0.1 02/17/2021 1351   EOSABS 0.2 07/20/2017 1315   BASOSABS 0.0 02/17/2021 1351   BASOSABS 0.0 07/20/2017 1315   . CMP Latest Ref Rng & Units 02/17/2021 01/07/2021 11/11/2020  Glucose 70 - 99 mg/dL 84 95 74  BUN 6 - 20 mg/dL 10 6 8   Creatinine 0.44 - 1.00 mg/dL 0.97 0.84 0.77  Sodium 135 - 145 mmol/L 138 139 136  Potassium 3.5 - 5.1 mmol/L 4.3 4.0 3.9  Chloride 98 - 111 mmol/L 102 101 100  CO2 22 - 32 mmol/L 27 27 26   Calcium 8.9 - 10.3 mg/dL 8.8(L) 9.6 9.0  Total Protein 6.5 - 8.1 g/dL 7.0 7.9 7.1  Total Bilirubin 0.3 - 1.2 mg/dL 0.5 0.5 1.1  Alkaline Phos 38 - 126 U/L 59 80 63  AST 15 - 41 U/L 29 40 51(H)  ALT 0 - 44 U/L 24 29 34     RADIOGRAPHIC STUDIES: I have personally reviewed the radiological images as listed and agreed with the findings in the report. No results found.  ASSESSMENT & PLAN:   35 y.o. African-American female with  #1 Refractory/Progressive Mixed cellularity Hodgkin's lymphoma IV BEwith extensive lymphadenopathy including right axillary, mediastinal and  upper retroperitoneal and now biopsy proven pulmonary involvement. She was noted to have significant constitutional symptoms including significant weight loss, fevers chills and some night sweats. HIV negative Hepatitis C and hepatitis B serologies negative. Echo with normal ejection fraction. Patient was treated with 5 cycles of AVD (Bleomycin held due to poor DLCO 37% and ongoing smoking). Multiple avoidable treatment delays due to the patient's noncompliance with follow-up for avoidable reasons. She has been counseled repeatedly that this would increase the likelihood of unfavorable outcome.  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 S/p 6 cycles of 2nd line treatment with Bendamustine/Brentuximab And 1 cycle of Bretuximab alone  -PET/CT scan results from 04/14/2017 were discussed in details. She appears to have some persistent disease in her right lung at Deauville 5. It is difficult to say if this is recurrent or persistent disease since the patient failed to follow-up on multiple scheduled PET/CT scans in the early part and prior to her second line treatment.  Patient was lost to followup for >1 yr  09/26/18 PET/CT revealed Imaging findings compatible with recurrence of disease. 2. Multiple new large areas of hypermetabolic nodularity and airspace consolidation within both lungs which is presumed to represent pulmonary involvement by lymphoma. Deauville criteria 5. 3. New hypermetabolic left supraclavicular, left retroperitoneal, and bilateral pelvic lymph nodes. Deauville criteria 5. 4. Multifocal hypermetabolic osseous lesions. Deauville criteria 5. 5. Small volume of ascites, new.   12/06/18 PET/CT revealed Generally improved appearance with previously mostly Deauville 5 activity now mostly Deauville 4 activity. 2. Layering of much of the airspace opacity in the lungs, although considerable right perihilar airspace opacity remains. The remaining pulmonary  opacities are assess this Deauville 5 on the right and over L4 on the left, and were previously Deauville 5 bilaterally. 3. Mildly reduced size and moderately reduced activity in the abdominopelvic lymph nodes which are now dove L4. 4. The previously seen left supraclavicular lymph node seems to have completely resolved (Deauville 0). 5. The skeletal lesions remain at Deauville 4, although in absolute terms have decreased in SUV compared to previous. 6. No new regions of malignant involvement compared to prior exam. 7. Other imaging findings of potential clinical significance: Low-density blood pool suggests anemia. Pectus excavatum. Volume loss in the right hemithorax.  06/13/19 PET/CT revealed "No abnormal hypermetabolism (Deauville 1). 2. Mixed lytic and sclerotic osseous lesions appear more prominent than on 12/06/2018 but do not have associated hypermetabolism, indicative of interval healing." 01/31/2020 CT ABDOMEN PELVIS W CONTRAST (Accession [id number]) and CT CHEST W CONTRAST (Accession [id number]) revealed "1. Small pre pericardial lymph node, not present on the prior study. 2. Stable volume loss in the right chest with shift of mediastinal structures into the right chest. 3. No change in the appearance of multifocal bony sclerosis, without signs of FDG uptake on the previous exam, attention on follow-up."  #2 s/p hypoxic respiratory failurewith dense right lung consolidation and left upper lobe consolidation with SVC syndrome. Patient has completed palliative radiation to the right lung mass causing SVC compression.  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  #4 Non compliancewith clinic and treatment followup. Missed 2nd dose of bendamustine with C2. Has missed multiple appointment for her PET/CT and missed appointment at [facility] for consideration of Transplant.  Pt was lost to follow up after July 2018 and returned on 08/31/18 Discussed the patient's goals of care and the pt noted that she is ready to begin treatment again and maintain compliance and follow ups   #5 h/o Grade 1 neuropathyfrom Brentuximab-Vedotin - resolved  #6 Shingles outbreak - curentlyresolved but with significant post herpetic neuralgia  #7 Significant anemia and thrombocytopenia after C1 of ICE -- will need close monitoring.  #8 Abnormal LFTs ? Related to Pembrolizumab - stable today  PLAN: -Discussed pt labwork, 02/17/2021;  Blood counts and chemistries stable. Mild anemia but unchanged from baseline. Liver function normal. Sedimentation rate normal. -Advised pt she has been on Pembrolizumab for over two years now (Jan 2020). Advised pt that if scans show no new pathology, then we will look at weaning off of the medicine given a data review. There is no clear understanding of the immunotherapy leading to remission. -Advised pt that transplant would still be the next step. The pt notes she is still definitively against the transplant and has no desire for it at this time. -Discussed Car T cell therapies and pt's potential eligibility for this. The pt does not desire this at this time. -No overt lab or clinical evidence of CHL progression at this time. -The pt has no prohibitive toxicities from continuing Pembrolizumab every three weeks at this time.  -Will continue with treatment and plan as same at this time. Will re-discuss pt preferences in the future. -Will see back in 6 weeks.    FOLLOW UP: -Plz schedule next 4 doses of Pembrolizumab q3weeks -Portflush and labs with each treatment -MD visit in 6 weeks  The total time spent in the appointment was 20 minutes and more than 50% was on counseling and direct patient cares.  All of the patient's questions were answered with apparent satisfaction. The patient knows to call  the clinic with any problems, questions or concerns.   Sullivan Lone MD [place] AAHIVMS [facility] Hematology/Oncology Physician [facility]  (Office):       [phone] (Work cell):  [phone] (Fax):           [phone]  I, Reinaldo Raddle, am acting as scribe for Dr. Sullivan Lone, MD.    .I have reviewed the above documentation for accuracy and completeness, and I agree with the above. Cloria Spring  Teala Daffron MD     ADDENDUM  CT chest/abd/pelvis 02/17/2021: - no evidence of recurrence/progression of her HL  IMPRESSION: 1. Similar multifocal sclerotic osseous lesions, likely treated lymphoma. 2. A precarinal node is upper normal sized, slightly decreased compared to the prior exam and indeterminate. 3. No new or progressive disease. 4. Aortic Atherosclerosis (ICD10-I70.0). This is significantly age advanced. 5. Right ovarian corpus luteal cyst. 6.  Emphysema (ICD10-J43.9).

## 2021-02-18 ENCOUNTER — Inpatient Hospital Stay: Payer: Medicaid Other

## 2021-02-18 ENCOUNTER — Inpatient Hospital Stay (HOSPITAL_BASED_OUTPATIENT_CLINIC_OR_DEPARTMENT_OTHER): Payer: Medicaid Other | Admitting: Hematology

## 2021-02-18 ENCOUNTER — Other Ambulatory Visit: Payer: Self-pay

## 2021-02-18 VITALS — BP 113/70 | HR 95 | Temp 97.2°F | Resp 20 | Ht 67.0 in | Wt 146.9 lb

## 2021-02-18 DIAGNOSIS — Z23 Encounter for immunization: Secondary | ICD-10-CM | POA: Diagnosis not present

## 2021-02-18 DIAGNOSIS — C8198 Hodgkin lymphoma, unspecified, lymph nodes of multiple sites: Secondary | ICD-10-CM | POA: Diagnosis not present

## 2021-02-18 DIAGNOSIS — Z5112 Encounter for antineoplastic immunotherapy: Secondary | ICD-10-CM | POA: Diagnosis not present

## 2021-02-18 DIAGNOSIS — Z7189 Other specified counseling: Secondary | ICD-10-CM

## 2021-02-18 MED ORDER — FAMOTIDINE 20 MG PO TABS
ORAL_TABLET | ORAL | Status: AC
  Filled 2021-02-18: qty 2

## 2021-02-18 MED ORDER — SODIUM CHLORIDE 0.9 % IV SOLN
200.0000 mg | Freq: Once | INTRAVENOUS | Status: AC
  Administered 2021-02-18: 200 mg via INTRAVENOUS
  Filled 2021-02-18: qty 8

## 2021-02-18 MED ORDER — DIPHENHYDRAMINE HCL 25 MG PO TABS
25.0000 mg | ORAL_TABLET | Freq: Once | ORAL | Status: AC
  Administered 2021-02-18: 25 mg via ORAL
  Filled 2021-02-18: qty 1

## 2021-02-18 MED ORDER — FAMOTIDINE 20 MG PO TABS
40.0000 mg | ORAL_TABLET | Freq: Once | ORAL | Status: AC
  Administered 2021-02-18: 40 mg via ORAL

## 2021-02-18 MED ORDER — DIPHENHYDRAMINE HCL 25 MG PO CAPS
ORAL_CAPSULE | ORAL | Status: AC
  Filled 2021-02-18: qty 1

## 2021-02-18 MED ORDER — SODIUM CHLORIDE 0.9 % IV SOLN
Freq: Once | INTRAVENOUS | Status: AC
  Filled 2021-02-18: qty 250

## 2021-02-18 MED ORDER — ACETAMINOPHEN 325 MG PO TABS
650.0000 mg | ORAL_TABLET | Freq: Once | ORAL | Status: AC
  Administered 2021-02-18: 650 mg via ORAL

## 2021-02-18 MED ORDER — ACETAMINOPHEN 325 MG PO TABS
ORAL_TABLET | ORAL | Status: AC
  Filled 2021-02-18: qty 2

## 2021-02-18 NOTE — Progress Notes (Signed)
Pt discharged in no apparent distress. Pt left ambulatory without assistance. Pt aware of discharge instructions and verbalized understanding and had no further questions.  

## 2021-03-09 ENCOUNTER — Telehealth: Payer: Self-pay | Admitting: *Deleted

## 2021-03-09 NOTE — Telephone Encounter (Signed)
Contacted patient regarding test results per Dr. Grier Mitts message. Patient verbalized understanding

## 2021-03-09 NOTE — Telephone Encounter (Signed)
-----   Message from Brunetta Genera, MD sent at 02/24/2021  9:33 PM EST ----- Hi Sandy-- plz let Ms Silverthorne know - CT chest/abd/pelvis 02/17/2021: - no evidence of recurrence/progression of her HL Will plan to continue her Pembrolizumab. thx GK

## 2021-03-11 ENCOUNTER — Inpatient Hospital Stay: Payer: Medicaid Other | Attending: Hematology

## 2021-03-11 ENCOUNTER — Inpatient Hospital Stay: Payer: Medicaid Other

## 2021-03-31 NOTE — Progress Notes (Incomplete)
HEMATOLOGY ONCOLOGY PROGRESS NOTE  Date of service:   04/01/21     Patient Care Team: Patient, No Pcp Per (Inactive) as PCP - General (General Practice)  Chief complaint: Follow-up for Hodgkin's lymphoma  Diagnosis:   Refractory Mixed cellularity Hodgkin's lymphoma IVBE with extensive lymphadenopathy including right axillary, mediastinal and upper retroperitoneal and now biopsy proven pulmonary involvement. She was noted to have significant constitutional symptoms including significant weight loss, fevers chills and some night sweats.  Current Treatment:  Pembrolizumab- 4th line. Patient declined HDT/Auto HSCT or consideration of Car-T cell therapy.  Previous treatment  5 cycles of AVD (without bleomycin due to lung involvement and DLCO of 37%, active smoker) Multiple avoidable treatment delays due to the patient's noncompliance with follow-up for avoidable reasons. She has been counseled repeatedly that this would increase the likelihood of unfavorable outcome.  2nd line therapy with Bendamustine + Brentuximab s/p 7 cycles.  3rd line therapy with ICE s/p 2 cycles   INTERVAL HISTORY: I connected with Rollen Sox on 04/01/2021 by telephone and verified that I am speaking with the correct person using two identifiers.   I discussed the limitations of evaluation and management by telemedicine. The patient expressed understanding and agreed to proceed.   Other persons participating in the visit and their role in the encounter:                                                         - Reinaldo Raddle, Medical Scribe     Patient's location: Home Provider's location: [place] at [facility]  Ms. Breisch is here for follow-up for her Hodgkins lymphoma for a very delayed Pembrolizumab treatment. The patient's last visit with [place] was on 02/19/2021. The pt reports that she is doing physical well and has no acute new sypmtoms.  The pt reports that she has recently got a new job. The pt  notes that she was unaware of this appointment until an automated voice message last night.  On review of systems, pt denies any acute symptoms.  REVIEW OF SYSTEMS:  10 Point review of Systems was done is negative except as noted above.  Past Medical History:  Diagnosis Date  . ARDS (adult respiratory distress syndrome) ([place])   . Asthma   . Hodgkin lymphoma ([place])   . Hypertension     . Past Surgical History:  Procedure Laterality Date  . AXILLARY LYMPH NODE BIOPSY Right 03/19/2016   Procedure: AXILLARY LYMPH NODE BIOPSY;  Surgeon: Armandina Gemma, MD;  Location: [facility];  Service: General;  Laterality: Right;  . IR GENERIC HISTORICAL  12/22/2016   IR FLUORO GUIDE PORT INSERTION LEFT 12/22/2016 [facility]  . IR GENERIC HISTORICAL  12/22/2016   IR US GUIDE VASC ACCESS LEFT 12/22/2016 [facility]  . IR GENERIC HISTORICAL  12/22/2016   IR CV LINE INJECTION 12/22/2016 [facility]  . IR GENERIC HISTORICAL  12/22/2016   IR REMOVAL TUN ACCESS W/ PORT W/O FL MOD SED 12/22/2016 [facility]  . VIDEO BRONCHOSCOPY Bilateral 11/26/2016   Procedure: VIDEO BRONCHOSCOPY WITH FLUORO;  Surgeon: Rigoberto Noel, MD;  Location: [facility];  Service: Cardiopulmonary;  Laterality: Bilateral;    . Social History   Tobacco Use  . Smoking status: Former Smoker    Packs/day: 0.50    Years: 15.00  Pack years: 7.50    Types: Cigarettes    Quit date: 03/27/2016    Years since quitting: 5.0  . Smokeless tobacco: Never Used  Vaping Use  . Vaping Use: Never used  Substance Use Topics  . Alcohol use: Yes    Comment: occasional now    ALLERGIES:  has No Known Allergies.  MEDICATIONS:  Current Outpatient Medications  Medication Sig Dispense Refill  . acyclovir (ZOVIRAX) 400 MG tablet Take 1 tablet (400 mg total) by mouth 2 (two) times daily. Start after completion of Valtrex (Patient not taking: Reported on 01/11/2020) 60 tablet 6  . amitriptyline (ELAVIL) 10 MG tablet Take 1 tablet (10 mg  total) by mouth at bedtime. (Patient not taking: Reported on 01/11/2020) 20 tablet 0  . gabapentin (NEURONTIN) 300 MG capsule Take 1 capsule (300 mg total) by mouth 2 (two) times daily. (Patient not taking: Reported on 01/11/2020) 60 capsule 0  . oxyCODONE-acetaminophen (PERCOCET/ROXICET) 5-325 MG tablet Take 1-2 tablets by mouth every 6 (six) hours as needed for severe pain. (Patient not taking: Reported on 01/11/2020) 50 tablet 0   No current facility-administered medications for this visit.   Facility-Administered Medications Ordered in Other Visits  Medication Dose Route Frequency Provider Last Rate Last Admin  . sodium chloride flush (NS) 0.9 % injection 10 mL  10 mL Intracatheter PRN Brunetta Genera, MD        PHYSICAL EXAMINATION: ECOG FS:1 - Symptomatic but completely ambulatory  There were no vitals filed for this visit. Wt Readings from Last 3 Encounters:  02/18/21 146 lb 14.4 oz (66.6 kg)  01/07/21 140 lb 6.4 oz (63.7 kg)  11/11/20 146 lb 8 oz (66.5 kg)   There is no height or weight on file to calculate BMI.    Telehealth Visit.  LABORATORY DATA:   I have reviewed the data as listed  CBC Latest Ref Rng & Units 02/17/2021 01/07/2021 11/11/2020  WBC 4.0 - 10.5 K/uL 3.9(L) 2.4(L) 3.0(L)  Hemoglobin 12.0 - 15.0 g/dL 11.2(L) 13.1 11.9(L)  Hematocrit 36.0 - 46.0 % 33.7(L) 39.6 35.3(L)  Platelets 150 - 400 K/uL 163 183 163   . CBC    Component Value Date/Time   WBC 3.9 (L) 02/17/2021 1351   RBC 3.39 (L) 02/17/2021 1351   HGB 11.2 (L) 02/17/2021 1351   HGB 10.6 (L) 11/07/2018 1047   HGB 11.7 07/20/2017 1315   HCT 33.7 (L) 02/17/2021 1351   HCT 35.2 07/20/2017 1315   PLT 163 02/17/2021 1351   PLT 473 (H) 11/07/2018 1047   PLT 266 07/20/2017 1315   MCV 99.4 02/17/2021 1351   MCV 93.2 07/20/2017 1315   MCH 33.0 02/17/2021 1351   MCHC 33.2 02/17/2021 1351   RDW 12.6 02/17/2021 1351   RDW 14.5 07/20/2017 1315   LYMPHSABS 0.5 (L) 02/17/2021 1351   LYMPHSABS 0.3  (L) 07/20/2017 1315   MONOABS 0.6 02/17/2021 1351   MONOABS 0.5 07/20/2017 1315   EOSABS 0.1 02/17/2021 1351   EOSABS 0.2 07/20/2017 1315   BASOSABS 0.0 02/17/2021 1351   BASOSABS 0.0 07/20/2017 1315   . CMP Latest Ref Rng & Units 02/17/2021 01/07/2021 11/11/2020  Glucose 70 - 99 mg/dL 84 95 74  BUN 6 - 20 mg/dL 10 6 8   Creatinine 0.44 - 1.00 mg/dL 0.97 0.84 0.77  Sodium 135 - 145 mmol/L 138 139 136  Potassium 3.5 - 5.1 mmol/L 4.3 4.0 3.9  Chloride 98 - 111 mmol/L 102 101 100  CO2 22 -  32 mmol/L 27 27 26   Calcium 8.9 - 10.3 mg/dL 8.8(L) 9.6 9.0  Total Protein 6.5 - 8.1 g/dL 7.0 7.9 7.1  Total Bilirubin 0.3 - 1.2 mg/dL 0.5 0.5 1.1  Alkaline Phos 38 - 126 U/L 59 80 63  AST 15 - 41 U/L 29 40 51(H)  ALT 0 - 44 U/L 24 29 34     RADIOGRAPHIC STUDIES: I have personally reviewed the radiological images as listed and agreed with the findings in the report. No results found.  ASSESSMENT & PLAN:   35 y.o. 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 She was noted to have significant constitutional symptoms including significant weight loss, fevers chills and some night sweats. HIV negative Hepatitis C and hepatitis B serologies negative. Echo with normal ejection fraction. Patient was treated with 5 cycles of AVD (Bleomycin held due to poor DLCO 37% and ongoing smoking). Multiple avoidable treatment delays due to the patient's noncompliance with follow-up for avoidable reasons. She has been counseled repeatedly that this would increase the likelihood of unfavorable outcome.  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 S/p 6 cycles of 2nd line treatment with Bendamustine/Brentuximab And 1 cycle of Bretuximab alone  -PET/CT scan results from 04/14/2017 were  discussed in details. She appears to have some persistent disease in her right lung at Deauville 5. It is difficult to say if this is recurrent or persistent disease since the patient failed to follow-up on multiple scheduled PET/CT scans in the early part and prior to her second line treatment.  Patient was lost to followup for >1 yr  09/26/18 PET/CT revealed Imaging findings compatible with recurrence of disease. 2. Multiple new large areas of hypermetabolic nodularity and airspace consolidation within both lungs which is presumed to represent pulmonary involvement by lymphoma. Deauville criteria 5. 3. New hypermetabolic left supraclavicular, left retroperitoneal, and bilateral pelvic lymph nodes. Deauville criteria 5. 4. Multifocal hypermetabolic osseous lesions. Deauville criteria 5. 5. Small volume of ascites, new.   12/06/18 PET/CT revealed Generally improved appearance with previously mostly Deauville 5 activity now mostly Deauville 4 activity. 2. Layering of much of the airspace opacity in the lungs, although considerable right perihilar airspace opacity remains. The remaining pulmonary opacities are assess this Deauville 5 on the right and over L4 on the left, and were previously Deauville 5 bilaterally. 3. Mildly reduced size and moderately reduced activity in the abdominopelvic lymph nodes which are now dove L4. 4. The previously seen left supraclavicular lymph node seems to have completely resolved (Deauville 0). 5. The skeletal lesions remain at Deauville 4, although in absolute terms have decreased in SUV compared to previous. 6. No new regions of malignant involvement compared to prior exam. 7. Other imaging findings of potential clinical significance: Low-density blood pool suggests anemia. Pectus excavatum. Volume loss in the right hemithorax.  06/13/19 PET/CT revealed "No abnormal hypermetabolism (Deauville 1). 2. Mixed lytic and sclerotic osseous lesions appear more prominent than on  12/06/2018 but do not have associated hypermetabolism, indicative of interval healing." 01/31/2020 CT ABDOMEN PELVIS W CONTRAST (Accession [id number]) and CT CHEST W CONTRAST (Accession [id number]) revealed "1. Small pre pericardial lymph node, not present on the prior study. 2. Stable volume loss in the right chest with shift of mediastinal structures into the right chest. 3. No change in the appearance of multifocal bony sclerosis, without signs of FDG uptake  on the previous exam, attention on follow-up."  #2 s/p hypoxic respiratory failurewith dense right lung consolidation and left upper lobe consolidation with SVC syndrome. Patient has completed palliative radiation to the right lung mass causing SVC compression.  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  #4 Non compliancewith clinic and treatment followup. Missed 2nd dose of bendamustine with C2. Has missed multiple appointment for her PET/CT and missed appointment at [facility] for consideration of Transplant.  Pt was lost to follow up after July 2018 and returned on 08/31/18 Discussed the patient's goals of care and the pt noted that she is ready to begin treatment again and maintain compliance and follow ups   #5 h/o Grade 1 neuropathyfrom Brentuximab-Vedotin - resolved  #6 Shingles outbreak - curentlyresolved but with significant post herpetic neuralgia  #7 Significant anemia and thrombocytopenia after C1 of ICE -- will need close monitoring.  #8 Abnormal LFTs ? Related to Pembrolizumab - stable today  PLAN: -Discussed goals of care and pt's missed treatments over the last year.  -Advised pt we can switch to maintenance and just monitor pt every 4-6 months.  -Advised pt that she can call the registration for a schedule if she has not received one.   -Advised pt that she may be logged out of the system after a certain number of missed appointments.  -Discussed the two options: continuing treatment every 3 weeks and switching to monitoring and holding off on continued treatments. The pt wishes to hold treatments and just monitor at this time. -No overt lab or clinical evidence of CHL progression at this time. -The pt has no prohibitive toxicities from continuing Pembrolizumab every three weeks at this time.  -Will get repeat CT in 2-3 months prior to next visit. -Will see back in 14 weeks with labs.   FOLLOW UP: CT Neck/Chest/Abd/Pel in 12 weeks RTC w Dr Irene Limbo in 14 weeks Please cancel all currently scheduled treatments at this time.    The total time spent in the appointment was 20 minutes and more than 50% was on counseling and direct patient cares.  All of the patient's questions were answered with apparent satisfaction. The patient knows to call the clinic with any problems, questions or concerns.   Sullivan Lone MD [place] AAHIVMS [facility] Hematology/Oncology Physician [facility]  (Office):       [phone] (Work cell):  [phone] (Fax):           [phone]  I, Reinaldo Raddle, am acting as scribe for Dr. Sullivan Lone, MD.

## 2021-04-01 ENCOUNTER — Inpatient Hospital Stay (HOSPITAL_BASED_OUTPATIENT_CLINIC_OR_DEPARTMENT_OTHER): Payer: Medicaid Other | Admitting: Hematology

## 2021-04-01 ENCOUNTER — Ambulatory Visit: Payer: Medicaid Other | Admitting: Hematology and Oncology

## 2021-04-01 ENCOUNTER — Ambulatory Visit: Payer: Medicaid Other

## 2021-04-01 ENCOUNTER — Other Ambulatory Visit: Payer: Medicaid Other

## 2021-04-01 ENCOUNTER — Inpatient Hospital Stay: Payer: Medicaid Other | Attending: Hematology

## 2021-04-01 ENCOUNTER — Inpatient Hospital Stay: Payer: Medicaid Other

## 2021-04-01 ENCOUNTER — Other Ambulatory Visit: Payer: Self-pay | Admitting: Hematology

## 2021-04-01 DIAGNOSIS — C8198 Hodgkin lymphoma, unspecified, lymph nodes of multiple sites: Secondary | ICD-10-CM

## 2021-04-01 NOTE — Progress Notes (Signed)
HEMATOLOGY ONCOLOGY PROGRESS NOTE  Date of service:   04/01/21     Patient Care Team: Patient, No Pcp Per (Inactive) as PCP - General (General Practice)  Chief complaint: Follow-up for Hodgkin's lymphoma  Diagnosis:   Refractory Mixed cellularity Hodgkin's lymphoma IVBE with extensive lymphadenopathy including right axillary, mediastinal and upper retroperitoneal and now biopsy proven pulmonary involvement. She was noted to have significant constitutional symptoms including significant weight loss, fevers chills and some night sweats.  Current Treatment:  Pembrolizumab- 4th line. Patient declined HDT/Auto HSCT or consideration of Car-T cell therapy.  Previous treatment  5 cycles of AVD (without bleomycin due to lung involvement and DLCO of 37%, active smoker) Multiple avoidable treatment delays due to the patient's noncompliance with follow-up for avoidable reasons. She has been counseled repeatedly that this would increase the likelihood of unfavorable outcome.  2nd line therapy with Bendamustine + Brentuximab s/p 7 cycles.  3rd line therapy with ICE s/p 2 cycles   INTERVAL HISTORY: I connected with Heidi [name] on 04/01/2021 by telephone and verified that I am speaking with the correct person using two identifiers.   I discussed the limitations of evaluation and management by telemedicine. The patient expressed understanding and agreed to proceed.   Other persons participating in the visit and their role in the encounter:                                                         - Reinaldo Raddle, Medical Scribe     Patient's location: Home Provider's location: [place] at [facility]  Heidi [name] is here for follow-up for her Hodgkins lymphoma for a very delayed Pembrolizumab treatment. The patient's last visit with [place] was on 02/19/2021. The pt reports that she is doing physical well and has no acute new sypmtoms.  The pt reports that she has recently got a new job. The pt  notes that she was unaware of this appointment until an automated voice message last night.  On review of systems, pt denies any acute symptoms.  REVIEW OF SYSTEMS:  10 Point review of Systems was done is negative except as noted above.  Past Medical History:  Diagnosis Date  . ARDS (adult respiratory distress syndrome) ([place])   . Asthma   . Hodgkin lymphoma ([place])   . Hypertension     . Past Surgical History:  Procedure Laterality Date  . AXILLARY LYMPH NODE BIOPSY Right 03/19/2016   Procedure: AXILLARY LYMPH NODE BIOPSY;  Surgeon: Armandina Gemma, MD;  Location: [facility];  Service: General;  Laterality: Right;  . IR GENERIC HISTORICAL  12/22/2016   IR FLUORO GUIDE PORT INSERTION LEFT 12/22/2016 [facility]  . IR GENERIC HISTORICAL  12/22/2016   IR US GUIDE VASC ACCESS LEFT 12/22/2016 [facility]  . IR GENERIC HISTORICAL  12/22/2016   IR CV LINE INJECTION 12/22/2016 [facility]  . IR GENERIC HISTORICAL  12/22/2016   IR REMOVAL TUN ACCESS W/ PORT W/O FL MOD SED 12/22/2016 [facility]  . VIDEO BRONCHOSCOPY Bilateral 11/26/2016   Procedure: VIDEO BRONCHOSCOPY WITH FLUORO;  Surgeon: Rigoberto Noel, MD;  Location: [facility];  Service: Cardiopulmonary;  Laterality: Bilateral;    . Social History   Tobacco Use  . Smoking status: Former Smoker    Packs/day: 0.50    Years: 15.00  Pack years: 7.50    Types: Cigarettes    Quit date: 03/27/2016    Years since quitting: 5.0  . Smokeless tobacco: Never Used  Vaping Use  . Vaping Use: Never used  Substance Use Topics  . Alcohol use: Yes    Comment: occasional now    ALLERGIES:  has No Known Allergies.  MEDICATIONS:  Current Outpatient Medications  Medication Sig Dispense Refill  . acyclovir (ZOVIRAX) 400 MG tablet Take 1 tablet (400 mg total) by mouth 2 (two) times daily. Start after completion of Valtrex (Patient not taking: Reported on 01/11/2020) 60 tablet 6  . amitriptyline (ELAVIL) 10 MG tablet Take 1 tablet (10 mg  total) by mouth at bedtime. (Patient not taking: Reported on 01/11/2020) 20 tablet 0  . gabapentin (NEURONTIN) 300 MG capsule Take 1 capsule (300 mg total) by mouth 2 (two) times daily. (Patient not taking: Reported on 01/11/2020) 60 capsule 0  . oxyCODONE-acetaminophen (PERCOCET/ROXICET) 5-325 MG tablet Take 1-2 tablets by mouth every 6 (six) hours as needed for severe pain. (Patient not taking: Reported on 01/11/2020) 50 tablet 0   No current facility-administered medications for this visit.   Facility-Administered Medications Ordered in Other Visits  Medication Dose Route Frequency Provider Last Rate Last Admin  . sodium chloride flush (NS) 0.9 % injection 10 mL  10 mL Intracatheter PRN Brunetta Genera, MD        PHYSICAL EXAMINATION: ECOG FS:1 - Symptomatic but completely ambulatory  There were no vitals filed for this visit. Wt Readings from Last 3 Encounters:  02/18/21 146 lb 14.4 oz (66.6 kg)  01/07/21 140 lb 6.4 oz (63.7 kg)  11/11/20 146 lb 8 oz (66.5 kg)   There is no height or weight on file to calculate BMI.    Telehealth Visit.  LABORATORY DATA:   I have reviewed the data as listed  CBC Latest Ref Rng & Units 02/17/2021 01/07/2021 11/11/2020  WBC 4.0 - 10.5 K/uL 3.9(L) 2.4(L) 3.0(L)  Hemoglobin 12.0 - 15.0 g/dL 11.2(L) 13.1 11.9(L)  Hematocrit 36.0 - 46.0 % 33.7(L) 39.6 35.3(L)  Platelets 150 - 400 K/uL 163 183 163   . CBC    Component Value Date/Time   WBC 3.9 (L) 02/17/2021 1351   RBC 3.39 (L) 02/17/2021 1351   HGB 11.2 (L) 02/17/2021 1351   HGB 10.6 (L) 11/07/2018 1047   HGB 11.7 07/20/2017 1315   HCT 33.7 (L) 02/17/2021 1351   HCT 35.2 07/20/2017 1315   PLT 163 02/17/2021 1351   PLT 473 (H) 11/07/2018 1047   PLT 266 07/20/2017 1315   MCV 99.4 02/17/2021 1351   MCV 93.2 07/20/2017 1315   MCH 33.0 02/17/2021 1351   MCHC 33.2 02/17/2021 1351   RDW 12.6 02/17/2021 1351   RDW 14.5 07/20/2017 1315   LYMPHSABS 0.5 (L) 02/17/2021 1351   LYMPHSABS 0.3  (L) 07/20/2017 1315   MONOABS 0.6 02/17/2021 1351   MONOABS 0.5 07/20/2017 1315   EOSABS 0.1 02/17/2021 1351   EOSABS 0.2 07/20/2017 1315   BASOSABS 0.0 02/17/2021 1351   BASOSABS 0.0 07/20/2017 1315   . CMP Latest Ref Rng & Units 02/17/2021 01/07/2021 11/11/2020  Glucose 70 - 99 mg/dL 84 95 74  BUN 6 - 20 mg/dL 10 6 8   Creatinine 0.44 - 1.00 mg/dL 0.97 0.84 0.77  Sodium 135 - 145 mmol/L 138 139 136  Potassium 3.5 - 5.1 mmol/L 4.3 4.0 3.9  Chloride 98 - 111 mmol/L 102 101 100  CO2 22 -  32 mmol/L 27 27 26   Calcium 8.9 - 10.3 mg/dL 8.8(L) 9.6 9.0  Total Protein 6.5 - 8.1 g/dL 7.0 7.9 7.1  Total Bilirubin 0.3 - 1.2 mg/dL 0.5 0.5 1.1  Alkaline Phos 38 - 126 U/L 59 80 63  AST 15 - 41 U/L 29 40 51(H)  ALT 0 - 44 U/L 24 29 34     RADIOGRAPHIC STUDIES: I have personally reviewed the radiological images as listed and agreed with the findings in the report. No results found.  ASSESSMENT & PLAN:   35 y.o. 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 She was noted to have significant constitutional symptoms including significant weight loss, fevers chills and some night sweats. HIV negative Hepatitis C and hepatitis B serologies negative. Echo with normal ejection fraction. Patient was treated with 5 cycles of AVD (Bleomycin held due to poor DLCO 37% and ongoing smoking). Multiple avoidable treatment delays due to the patient's noncompliance with follow-up for avoidable reasons. She has been counseled repeatedly that this would increase the likelihood of unfavorable outcome.  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 S/p 6 cycles of 2nd line treatment with Bendamustine/Brentuximab And 1 cycle of Bretuximab alone  -PET/CT scan results from 04/14/2017 were  discussed in details. She appears to have some persistent disease in her right lung at Deauville 5. It is difficult to say if this is recurrent or persistent disease since the patient failed to follow-up on multiple scheduled PET/CT scans in the early part and prior to her second line treatment.  Patient was lost to followup for >1 yr  09/26/18 PET/CT revealed Imaging findings compatible with recurrence of disease. 2. Multiple new large areas of hypermetabolic nodularity and airspace consolidation within both lungs which is presumed to represent pulmonary involvement by lymphoma. Deauville criteria 5. 3. New hypermetabolic left supraclavicular, left retroperitoneal, and bilateral pelvic lymph nodes. Deauville criteria 5. 4. Multifocal hypermetabolic osseous lesions. Deauville criteria 5. 5. Small volume of ascites, new.   12/06/18 PET/CT revealed Generally improved appearance with previously mostly Deauville 5 activity now mostly Deauville 4 activity. 2. Layering of much of the airspace opacity in the lungs, although considerable right perihilar airspace opacity remains. The remaining pulmonary opacities are assess this Deauville 5 on the right and over L4 on the left, and were previously Deauville 5 bilaterally. 3. Mildly reduced size and moderately reduced activity in the abdominopelvic lymph nodes which are now dove L4. 4. The previously seen left supraclavicular lymph node seems to have completely resolved (Deauville 0). 5. The skeletal lesions remain at Deauville 4, although in absolute terms have decreased in SUV compared to previous. 6. No new regions of malignant involvement compared to prior exam. 7. Other imaging findings of potential clinical significance: Low-density blood pool suggests anemia. Pectus excavatum. Volume loss in the right hemithorax.  06/13/19 PET/CT revealed "No abnormal hypermetabolism (Deauville 1). 2. Mixed lytic and sclerotic osseous lesions appear more prominent than on  12/06/2018 but do not have associated hypermetabolism, indicative of interval healing." 01/31/2020 CT ABDOMEN PELVIS W CONTRAST (Accession [id number]) and CT CHEST W CONTRAST (Accession [id number]) revealed "1. Small pre pericardial lymph node, not present on the prior study. 2. Stable volume loss in the right chest with shift of mediastinal structures into the right chest. 3. No change in the appearance of multifocal bony sclerosis, without signs of FDG uptake  on the previous exam, attention on follow-up."  #2 s/p hypoxic respiratory failurewith dense right lung consolidation and left upper lobe consolidation with SVC syndrome. Patient has completed palliative radiation to the right lung mass causing SVC compression.  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  #4 Non compliancewith clinic and treatment followup. Missed 2nd dose of bendamustine with C2. Has missed multiple appointment for her PET/CT and missed appointment at [facility] for consideration of Transplant.  Pt was lost to follow up after July 2018 and returned on 08/31/18 Discussed the patient's goals of care and the pt noted that she is ready to begin treatment again and maintain compliance and follow ups   #5 h/o Grade 1 neuropathyfrom Brentuximab-Vedotin - resolved  #6 Shingles outbreak - curentlyresolved but with significant post herpetic neuralgia  #7 Significant anemia and thrombocytopenia after C1 of ICE -- will need close monitoring.  #8 Abnormal LFTs ? Related to Pembrolizumab - stable today  PLAN: -Discussed goals of care and pt's missed treatments over the last year.  -Advised pt that she can call the registration for a schedule if she has not received one.  -Discussed the two options: continuing treatment every 3 weeks and switching to  monitoring and holding off on continued treatments. The pt wishes to hold treatments and just monitor at this time. -No overt lab or clinical evidence of CHL progression at this time. -The pt has no prohibitive toxicities from continuing Pembrolizumab every three weeks at this time.  -Will get repeat CT in 2-3 months prior to next visit. -Will see back in 14 weeks with labs.   FOLLOW UP: CT Neck/Chest/Abd/Pel in 12 weeks RTC w Dr Irene Limbo in 14 weeks Please cancel all currently scheduled treatments at this time.    The total time spent in the appointment was 20 minutes and more than 50% was on counseling and direct patient cares.  All of the patient's questions were answered with apparent satisfaction. The patient knows to call the clinic with any problems, questions or concerns.   Sullivan Lone MD [place] AAHIVMS [facility] Hematology/Oncology Physician [facility]  (Office):       [phone] (Work cell):  [phone] (Fax):           [phone]  I, Reinaldo Raddle, am acting as scribe for Dr. Sullivan Lone, MD.    .I have reviewed the above documentation for accuracy and completeness, and I agree with the above. Brunetta Genera MD

## 2021-04-22 ENCOUNTER — Other Ambulatory Visit: Payer: Medicaid Other

## 2021-04-22 ENCOUNTER — Inpatient Hospital Stay: Payer: Medicaid Other

## 2021-05-13 ENCOUNTER — Inpatient Hospital Stay: Payer: Medicaid Other

## 2021-05-13 ENCOUNTER — Other Ambulatory Visit: Payer: Medicaid Other

## 2021-05-15 ENCOUNTER — Telehealth: Payer: Self-pay | Admitting: Hematology

## 2021-05-15 NOTE — Telephone Encounter (Signed)
Called to schedule follow-up appointment per 5/19 secure chat. Patient to call back Monday to schedule 45-month f/u with labs.

## 2021-05-21 ENCOUNTER — Telehealth: Payer: Self-pay | Admitting: Hematology

## 2021-05-21 NOTE — Telephone Encounter (Signed)
Scheduled appt sper 5/25 sch msg. Pt aware.  

## 2021-06-22 ENCOUNTER — Emergency Department (HOSPITAL_COMMUNITY)
Admission: EM | Admit: 2021-06-22 | Discharge: 2021-06-22 | Disposition: A | Discharge status: Home / Self Care | Payer: Medicaid Other | Attending: Emergency Medicine | Admitting: Emergency Medicine

## 2021-06-22 ENCOUNTER — Emergency Department (HOSPITAL_COMMUNITY): Payer: Medicaid Other

## 2021-06-22 ENCOUNTER — Other Ambulatory Visit: Payer: Self-pay

## 2021-06-22 DIAGNOSIS — Z87891 Personal history of nicotine dependence: Secondary | ICD-10-CM | POA: Insufficient documentation

## 2021-06-22 DIAGNOSIS — R079 Chest pain, unspecified: Secondary | ICD-10-CM | POA: Insufficient documentation

## 2021-06-22 DIAGNOSIS — R059 Cough, unspecified: Secondary | ICD-10-CM | POA: Diagnosis not present

## 2021-06-22 DIAGNOSIS — J45909 Unspecified asthma, uncomplicated: Secondary | ICD-10-CM | POA: Insufficient documentation

## 2021-06-22 DIAGNOSIS — R0602 Shortness of breath: Secondary | ICD-10-CM | POA: Diagnosis not present

## 2021-06-22 DIAGNOSIS — R42 Dizziness and giddiness: Secondary | ICD-10-CM | POA: Insufficient documentation

## 2021-06-22 DIAGNOSIS — R112 Nausea with vomiting, unspecified: Secondary | ICD-10-CM | POA: Diagnosis not present

## 2021-06-22 DIAGNOSIS — I10 Essential (primary) hypertension: Secondary | ICD-10-CM | POA: Insufficient documentation

## 2021-06-22 LAB — COMPREHENSIVE METABOLIC PANEL
ALT: 54 U/L — ABNORMAL HIGH (ref 0–44)
AST: 106 U/L — ABNORMAL HIGH (ref 15–41)
Albumin: 4.2 g/dL (ref 3.5–5.0)
Alkaline Phosphatase: 53 U/L (ref 38–126)
Anion gap: 17 — ABNORMAL HIGH (ref 5–15)
BUN: 9 mg/dL (ref 6–20)
CO2: 22 mmol/L (ref 22–32)
Calcium: 9.3 mg/dL (ref 8.9–10.3)
Chloride: 98 mmol/L (ref 98–111)
Creatinine, Ser: 0.89 mg/dL (ref 0.44–1.00)
GFR, Estimated: >60 mL/min (ref >60)
Glucose, Bld: 68 mg/dL — ABNORMAL LOW (ref 70–99)
Potassium: 3.9 mmol/L (ref 3.5–5.1)
Sodium: 137 mmol/L (ref 135–145)
Total Bilirubin: 1 mg/dL (ref 0.3–1.2)
Total Protein: 7.1 g/dL (ref 6.5–8.1)

## 2021-06-22 LAB — CBC
HCT: 41 % (ref 36.0–46.0)
Hemoglobin: 13.8 g/dL (ref 12.0–15.0)
MCH: 33.6 pg (ref 26.0–34.0)
MCHC: 33.7 g/dL (ref 30.0–36.0)
MCV: 99.8 fL (ref 80.0–100.0)
Platelets: 201 10*3/uL (ref 150–400)
RBC: 4.11 MIL/uL (ref 3.87–5.11)
RDW: 11.9 % (ref 11.5–15.5)
WBC: 3.9 10*3/uL — ABNORMAL LOW (ref 4.0–10.5)
nRBC: 0 % (ref 0.0–0.2)

## 2021-06-22 LAB — I-STAT BETA HCG BLOOD, ED (MC, WL, AP ONLY): I-stat hCG, quantitative: <5 m[IU]/mL (ref <5)

## 2021-06-22 LAB — CBG MONITORING, ED: Glucose-Capillary: 137 mg/dL — ABNORMAL HIGH (ref 70–99)

## 2021-06-22 LAB — TROPONIN I (HIGH SENSITIVITY)
Troponin I (High Sensitivity): 12 ng/L (ref <18)
Troponin I (High Sensitivity): 14 ng/L (ref <18)

## 2021-06-22 MED ORDER — ONDANSETRON HCL 4 MG/2ML IJ SOLN
4.0000 mg | Freq: Once | INTRAMUSCULAR | Status: AC
  Administered 2021-06-22: 4 mg via INTRAVENOUS
  Filled 2021-06-22: qty 2

## 2021-06-22 MED ORDER — IOHEXOL 350 MG/ML SOLN
50.0000 mL | Freq: Once | INTRAVENOUS | Status: AC | PRN
  Administered 2021-06-22: 50 mL via INTRAVENOUS

## 2021-06-22 NOTE — Discharge Instructions (Signed)
  Please follow-up with your oncologist, I also did refer you to a pulmonologist.  This is extremely important for you to follow-up with.  If you have any new or worsening concerning symptoms please come back to the emergency department.  Your CT is below, please go over this with your oncologist.   IMPRESSION:  1. No appreciable pulmonary embolus. No thoracic aortic aneurysm or  dissection. There is left ventricular hypertrophy.     2. Prominence of the main pulmonary outflow tract may indicate a  degree of pulmonary arterial hypertension.     3. 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     4. Areas of mosaic attenuation on the left may represent a degree of  underlying small airways obstructive disease. No edema or  consolidation appreciable. No pleural effusions.     5.  No evident adenopathy by size criteria.     6.  Bony changes indicative of treated lymphoma, stable.     7.  Hepatic steatosis.

## 2021-06-22 NOTE — ED Triage Notes (Signed)
Patient brought in via EMS from home with a complaint of waking up feeling dizzy, short or breath, nausea, and like her chest was "closing in". EMS gave 1 ASA and 1 nitro SL.

## 2021-06-22 NOTE — ED Provider Notes (Signed)
[facility] EMERGENCY DEPARTMENT Provider Note   CSN: [id number] Arrival date & time: 06/22/21  9379     History Chief Complaint  Patient presents with   Shortness of Breath   Dizziness    Heidi [name] is a 35 y.o. 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  Patient states that she was feeling fine this morning, felt nauseous and vomited twice and then she had acute onset of chest tightness and shortness of breath.  States that this lasted a couple minutes until EMS came.  She states that EMS gave her 1 nitro and aspirin and her symptoms have mostly resolved, states that her symptoms have mostly resolved while sitting here, states that she is still having some slight chest tightness.  Denies any chest pain, radiation.  States that she was walking when this happened.  Patient states that she does often have nausea and vomiting with the immunotherapy.  Has not had immunotherapy since April.  Patient does not feel nauseous anymore.  Denies any leg pain or leg swelling, recent travel, recent surgeries, history of coagulation disorder. No fevers, new cough, URI symptoms.  Patient states that she does often cough, due to lung consolidation.  States this is how this began, she states that she was coughing a lot, vomited and then had the chest tightening sensation shortness of breath. She states that she thinks coughing stemmed this.   HPI     Past Medical History:  Diagnosis Date   ARDS (adult respiratory distress syndrome) ([place])    Asthma    Hodgkin lymphoma ([place])    Hypertension     Patient Active Problem List   Diagnosis Date Noted   Post herpetic neuralgia    Hodgkin's lymphoma ([place]) 11/14/2018   Herpes zoster without complication    Counseling  regarding advance care planning and goals of care 10/09/2018   Hodgkin's disease in adult [facility]) 10/09/2018   Port-A-Cath in place 09/28/2018   Pruritus 01/20/2017   Central line complication    Deep vein thrombosis (DVT) of upper extremity ([place])    SVC syndrome    Dyspnea 12/14/2016   Swelling 12/14/2016   Anemia of chronic disease 11/26/2016   CAP (community acquired pneumonia) 11/23/2016   PNA (pneumonia) 11/23/2016   Hyperpigmentation 06/04/2016   Hypersensitivity reaction 05/04/2016   Encounter for antineoplastic chemotherapy    Hypokalemia 04/20/2016   Volume overload 04/20/2016   S/P bronchoscopy with biopsy    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 ([place])    HCAP (healthcare-associated pneumonia) 04/10/2016   Hyponatremia 04/10/2016   Thrombocytosis 04/10/2016   Pneumonitis 03/16/2016   Postobstructive pneumonia 03/16/2016   Acute respiratory failure with hypoxia ([place]) 03/16/2016   Leukocytosis 03/16/2016   Sepsis due to pneumonia ([place]) 03/16/2016   Lung mass    Lymphadenopathy     Past Surgical History:  Procedure Laterality Date   AXILLARY LYMPH NODE BIOPSY Right 03/19/2016   Procedure: AXILLARY LYMPH NODE BIOPSY;  Surgeon: Armandina Gemma, MD;  Location: [facility];  Service: General;  Laterality: Right;   IR GENERIC HISTORICAL  12/22/2016   IR FLUORO GUIDE PORT INSERTION LEFT 12/22/2016 [facility]   IR GENERIC HISTORICAL  12/22/2016   IR US GUIDE VASC ACCESS LEFT 12/22/2016 [facility]   IR GENERIC HISTORICAL  12/22/2016   IR CV LINE INJECTION 12/22/2016 [facility]   IR GENERIC HISTORICAL  12/22/2016   IR REMOVAL TUN ACCESS W/ PORT W/O FL MOD SED 12/22/2016 [facility]   VIDEO BRONCHOSCOPY Bilateral 11/26/2016   Procedure: VIDEO BRONCHOSCOPY WITH FLUORO;  Surgeon: Rigoberto Noel, MD;  Location: [facility] END[facility];  Service: Cardiopulmonary;  Laterality: Bilateral;     OB History   No obstetric history on file.     No family history  on file.  Social History   Tobacco Use   Smoking status: Former    Packs/day: 0.50    Years: 15.00    Pack years: 7.50    Types: Cigarettes    Quit date: 03/27/2016    Years since quitting: 5.2   Smokeless tobacco: Never  Vaping Use   Vaping Use: Never used  Substance Use Topics   Alcohol use: Yes    Comment: occasional now    Home Medications Prior to Admission medications   Medication Sig Start Date End Date Taking? Authorizing Provider  acyclovir (ZOVIRAX) 400 MG tablet Take 1 tablet (400 mg total) by mouth 2 (two) times daily. Start after completion of Valtrex Patient not taking: Reported on 01/11/2020 10/24/18   Brunetta Genera, MD  amitriptyline (ELAVIL) 10 MG tablet Take 1 tablet (10 mg total) by mouth at bedtime. Patient not taking: Reported on 01/11/2020 10/29/18   Lajean Saver, MD  gabapentin (NEURONTIN) 300 MG capsule Take 1 capsule (300 mg total) by mouth 2 (two) times daily. Patient not taking: Reported on 01/11/2020 12/06/18   Brunetta Genera, MD  oxyCODONE-acetaminophen (PERCOCET/ROXICET) 5-325 MG tablet Take 1-2 tablets by mouth every 6 (six) hours as needed for severe pain. Patient not taking: Reported on 01/11/2020 01/02/19   Brunetta Genera, MD  prochlorperazine (COMPAZINE) 10 MG tablet Take 1 tablet (10 mg total) by mouth every 6 (six) hours as needed (Nausea or vomiting). 10/11/18 01/06/19  Brunetta Genera, MD    Allergies    Patient has no known allergies.  Review of Systems   Review of Systems  Constitutional:  Negative for chills, diaphoresis, fatigue and fever.  HENT:  Negative for congestion, sore throat and trouble swallowing.   Eyes:  Negative for pain and visual disturbance.  Respiratory:  Positive for shortness of breath. Negative for cough and wheezing.   Cardiovascular:  Positive for chest pain. Negative for palpitations and leg swelling.  Gastrointestinal:  Positive for nausea and vomiting. Negative for abdominal distention,  abdominal pain and diarrhea.  Genitourinary:  Negative for difficulty urinating.  Musculoskeletal:  Negative for back pain, neck pain and neck stiffness.  Skin:  Negative for pallor.  Neurological:  Negative for dizziness, speech difficulty, weakness and headaches.  Psychiatric/Behavioral:  Negative for confusion.    Physical Exam Updated Vital Signs BP (!) 144/99 (BP Location: Right Arm)   Pulse 88   Temp 98.3 F (36.8 C) (Oral)   Resp 20   Ht 5\' 7"  (1.702 m)   Wt 63.5 kg   SpO2 98%   BMI 21.93 kg/m   Physical Exam Constitutional:      General: She is not in acute distress.    Appearance: Normal appearance. She is not ill-appearing, toxic-appearing or diaphoretic.  HENT:     Mouth/Throat:     Mouth: Mucous membranes are moist.     Pharynx: Oropharynx is clear.  Eyes:     General: No scleral icterus.    Extraocular Movements: Extraocular movements intact.  Pupils: Pupils are equal, round, and reactive to light.  Cardiovascular:     Rate and Rhythm: Regular rhythm. Tachycardia present.     Pulses: Normal pulses.     Heart sounds: Normal heart sounds.  Pulmonary:     Effort: Pulmonary effort is normal. No respiratory distress.     Breath sounds: Normal breath sounds. No stridor. No wheezing, rhonchi or rales.  Chest:     Chest wall: No tenderness.  Abdominal:     General: Abdomen is flat. There is no distension.     Palpations: Abdomen is soft.     Tenderness: There is no abdominal tenderness. There is no guarding or rebound.  Musculoskeletal:        General: No swelling or tenderness. Normal range of motion.     Cervical back: Normal range of motion and neck supple. No rigidity.     Right lower leg: No edema.     Left lower leg: No edema.     Comments: No RU extremity swelling  Skin:    General: Skin is warm and dry.     Capillary Refill: Capillary refill takes less than 2 seconds.     Coloration: Skin is not pale.  Neurological:     General: No focal deficit  present.     Mental Status: She is alert and oriented to person, place, and time.  Psychiatric:        Mood and Affect: Mood normal.        Behavior: Behavior normal.    ED Results / Procedures / Treatments   Labs (all labs ordered are listed, but only abnormal results are displayed) Labs Reviewed  CBC - Abnormal; Notable for the following components:      Result Value   WBC 3.9 (*)    All other components within normal limits  COMPREHENSIVE METABOLIC PANEL - Abnormal; Notable for the following components:   Glucose, Bld 68 (*)    AST 106 (*)    ALT 54 (*)    Anion gap 17 (*)    All other components within normal limits  CBG MONITORING, ED - Abnormal; Notable for the following components:   Glucose-Capillary 137 (*)    All other components within normal limits  I-STAT BETA HCG BLOOD, ED ([facility], [facility], [facility] ONLY)  TROPONIN I (HIGH SENSITIVITY)  TROPONIN I (HIGH SENSITIVITY)    EKG EKG Interpretation  Date/Time:  Monday June 22 2021 09:21:53 EDT Ventricular Rate:  110 PR Interval:  140 QRS Duration: 79 QT Interval:  352 QTC Calculation: 477 R Axis:   44 Text Interpretation: Sinus tachycardia Biatrial enlargement Anteroseptal infarct, age indeterminate Sinus tachycardia Confirmed by Lavenia Atlas [phone]) on 06/22/2021 1:52:40 PM  Radiology CT Angio Chest PE W/Cm &/Or Wo Cm  Result Date: 06/22/2021 CLINICAL DATA:  Shortness of breath.  History of Hodgkin's lymphoma EXAM: CT ANGIOGRAPHY CHEST WITH CONTRAST TECHNIQUE: Multidetector CT imaging of the chest was performed using the standard protocol during bolus administration of intravenous contrast. Multiplanar CT image reconstructions and MIPs were obtained to evaluate the vascular anatomy. CONTRAST:  66mL OMNIPAQUE IOHEXOL 350 MG/ML SOLN COMPARISON:  Chest CT February 17, 2021; chest radiograph June 22, 2021 FINDINGS: Cardiovascular: No demonstrable pulmonary embolus. Note that there is fibrosis in the perihilar region on the right  causing mild narrowing of several pulmonary arterial vessels on the right, stable from prior study. There is no thoracic aortic aneurysm or dissection. Visualized great vessels appear unremarkable. There is evidence of a  degree of left ventricular hypertrophy. No pericardial effusion or pericardial thickening. Main pulmonary outflow tract measures 3.1 cm, mildly prominent. Port-A-Cath tip in superior vena cava. Mediastinum/Nodes: 5 mm nodular opacity in the left lobe of the thyroid is stable; no additional imaging surveillance of this small nodular opacity warranted per consensus guidelines. No evident thoracic adenopathy. No esophageal lesions appreciable. Lungs/Pleura: There is stable scarring in the right upper lobe with localized reticular thickening. 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 There is no appreciable new opacity on the right. On the left, there are areas of relative mosaic attenuation without well-defined airspace opacity. No pleural effusions evident on either side. No pneumothorax. Trachea and major bronchial structures appear patent. Upper Abdomen: There is hepatic steatosis. Visualized upper abdominal structures otherwise appear unremarkable. Musculoskeletal: Stable sclerotic foci in the thoracic spine. No new bone lesions evident. No appreciable chest wall lesions. Review of the MIP images confirms the above findings. IMPRESSION: 1. No appreciable pulmonary embolus. No thoracic aortic aneurysm or dissection. There is left ventricular hypertrophy. 2. Prominence of the main pulmonary outflow tract may indicate a degree of pulmonary arterial hypertension. 3. Fibrosis on the right with volume loss and post radiation therapy changes, essentially stable from prior study. 4. Areas of mosaic attenuation on the left may represent a degree of  underlying small airways obstructive disease. No edema or consolidation appreciable. No pleural effusions. 5.  No evident adenopathy by size criteria. 6.  Bony changes indicative of treated lymphoma, stable. 7.  Hepatic steatosis. Electronically Signed   By: Lowella Grip III M.D.   On: 06/22/2021 12:31   DG Chest Port 1 View  Result Date: 06/22/2021 CLINICAL DATA:  Chest pain EXAM: PORTABLE CHEST 1 VIEW COMPARISON:  02/17/2021 CT FINDINGS: Cardiac shadow is enlarged but stable. Left chest wall port is again noted. Central changes of radiation fibrosis are again seen similar to that noted on prior CT with volume loss in the right lung. Left lung is hyperinflated. No focal infiltrate or sizable effusion is seen. No bony abnormality is noted. 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 No new focal abnormality is noted. Electronically Signed   By: Inez Catalina M.D.   On: 06/22/2021 09:49    Procedures Procedures   Medications Ordered in ED Medications  ondansetron (ZOFRAN) injection 4 mg (4 mg Intravenous Given 06/22/21 1029)  iohexol (OMNIPAQUE) 350 MG/ML injection 50 mL (50 mLs Intravenous Contrast Given 06/22/21 1208)    ED Course  I have reviewed the triage vital signs and the nursing notes.  Pertinent labs & imaging results that were available during my care of the patient were reviewed by me and considered in my medical decision making (see chart for details).    MDM Rules/Calculators/A&P                          Heidi [name] is a 35 y.o. 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  Pt is tachycardic, no respiratory distress, appears very well.   Patient states that pain and shortness of breath were relieved after EMS gave her 1 nitro.  Patient states  that  she still complaining of chest tightness, differential to include PE, ACS, PNA. Low concerns for SVC syndrome.   Work-up today with 2 negative flat troponins.  CMP with some electrolyte derangements with glucose of 68, slightly elevated AST and ALT with anion gap.  Will give some juice for glucose, on repeat it was 137.  CBC with white count of 3.9, appears stable.  CT below.  IMPRESSION:  1. No appreciable pulmonary embolus. No thoracic aortic aneurysm or  dissection. There is left ventricular hypertrophy.     2. Prominence of the main pulmonary outflow tract may indicate a  degree of pulmonary arterial hypertension.     3. 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     4. Areas of mosaic attenuation on the left may represent a degree of  underlying small airways obstructive disease. No edema or  consolidation appreciable. No pleural effusions.     5.  No evident adenopathy by size criteria.     6.  Bony changes indicative of treated lymphoma, stable.     7.  Hepatic steatosis.    Patient is without any symptoms currently, has been symptom-free since she arrived.  Patient states that she feels really good and she wants to go home.  Tachycardia has resolved.  Patient states that she just thinks she started coughing and then had chest closing sensation. Possible aspiration? However pt is fine now. Did express to patient that since she is high risk, has anion gap and findings on CT and I will like to speak to oncologist.  Patient states that she wants to leave and she feels much better than when she came in, does not want to wait and wants to go home. Shared decision making with patient, CT findings are nonacute and appears stable, patient is ambulatory without any respiratory distress.  Anion gap could be from hyperventilation. Patient has been observed for 5 hours.  Low concerns for ACS since troponins are normal and  patient is symptom-free, however will have patient follow-up with [facility] and her oncologist. Pt does have close follow up.   I think her persistent coughing could have provoked her chest tightness, however since patient has been symptom-free I am okay with discharge at this time, did have shared decision-making about observation however she wants to go.  Did discuss case with Dr. Dina Rich.   Doubt need for further emergent work up at this time. I explained the diagnosis and have given explicit precautions to return to the ER including for any other new or worsening symptoms. The patient understands and accepts the medical plan as it's been dictated and I have answered their questions. Discharge instructions concerning home care and prescriptions have been given. The patient is STABLE and is discharged to home in good condition.  I discussed this case with my attending physician who cosigned this note including patient's presenting symptoms, physical exam, and planned diagnostics and interventions. Attending physician stated agreement with plan or made changes to plan which were implemented.    Final Clinical Impression(s) / ED Diagnoses Final diagnoses:  Shortness of breath    Rx / DC Orders ED Discharge Orders          Ordered    Ambulatory referral to Pulmonology        06/22/21 [place], [place], PA-C 06/22/21 1438    [place], Drue Dun  M, DO 06/29/21 1903

## 2021-06-22 NOTE — ED Notes (Signed)
Got patient undressed on the monitor did ekg shown to er provider got patient some warm blankets patient is resting with call bell in reach

## 2021-07-09 ENCOUNTER — Encounter: Payer: Self-pay | Admitting: Pulmonary Disease

## 2021-07-09 ENCOUNTER — Ambulatory Visit (HOSPITAL_COMMUNITY): Admission: RE | Admit: 2021-07-09 | Payer: Medicaid Other | Source: Ambulatory Visit

## 2021-07-09 ENCOUNTER — Telehealth: Payer: Self-pay

## 2021-07-09 NOTE — Telephone Encounter (Signed)
Call attempt. Pt did not show for scheduled CT scan today. Attempted to discuss rescheduling CT prior to next MD appointment. Requested pt call back.

## 2021-07-14 NOTE — Progress Notes (Signed)
HEMATOLOGY ONCOLOGY PROGRESS NOTE  Date of service:   07/14/21     Patient Care Team: Patient, No Pcp Per (Inactive) as PCP - General (General Practice)  Chief complaint: Follow-up for Hodgkin's lymphoma  Diagnosis:   Refractory Mixed cellularity Hodgkin's lymphoma IVBE with extensive lymphadenopathy including right axillary, mediastinal and upper retroperitoneal and now biopsy proven pulmonary involvement. She was noted to have significant constitutional symptoms including significant weight loss, fevers chills and some night sweats.  Current Treatment:  Pembrolizumab- 4th line. Patient declined HDT/Auto HSCT or consideration of Car-T cell therapy.  Previous treatment  5 cycles of AVD (without bleomycin due to lung involvement and DLCO of 37%, active smoker) Multiple avoidable treatment delays due to the patient's noncompliance with follow-up for avoidable reasons. She has been counseled repeatedly that this would increase the likelihood of unfavorable outcome.  2nd line therapy with Bendamustine + Brentuximab s/p 7 cycles.  3rd line therapy with ICE s/p 2 cycles   INTERVAL HISTORY:  Heidi [name] is here for follow-up for her Hodgkins lymphoma for a very delayed Pembrolizumab treatment. The patient's last visit with [place] was on 04/062022. The pt reports that she is doing well overall.  The pt reports no acute new symptoms. No fevers/chills/night sweats, new lumps or bumps. She had some acute SOB with CTA done on 6/27 which showed no PE or other acute pathology. Patient feels she was stressed and had a panic attack.  Of note since the patient's last visit, pt has had CT Angio Chest on 06/22/2021, which revealed "1. No appreciable pulmonary embolus. No thoracic aortic aneurysm or dissection. There is left ventricular hypertrophy.   2. Prominence of the main pulmonary outflow tract may indicate a degree of pulmonary arterial hypertension.   3. Fibrosis on the right with volume  loss and post radiation therapy changes, essentially stable from prior study.   4. Areas of mosaic attenuation on the left may represent a degree of underlying small airways obstructive disease. No edema or consolidation appreciable. No pleural effusions.   5.  No evident adenopathy by size criteria.   6.  Bony changes indicative of treated lymphoma, stable.   7.  Hepatic steatosis."   Lab results today 07/14/2021 of CBC w/diff and CMP is as follows: all values are WNL Sed rate wnl at 2  On review of systems, pt reports no other acute new symptoms.   REVIEW OF SYSTEMS:  10 Point review of Systems was done is negative except as noted above.  Past Medical History:  Diagnosis Date   ARDS (adult respiratory distress syndrome) ([place])    Asthma    Hodgkin lymphoma ([place])    Hypertension     . Past Surgical History:  Procedure Laterality Date   AXILLARY LYMPH NODE BIOPSY Right 03/19/2016   Procedure: AXILLARY LYMPH NODE BIOPSY;  Surgeon: Armandina Gemma, MD;  Location: [facility];  Service: General;  Laterality: Right;   IR GENERIC HISTORICAL  12/22/2016   IR FLUORO GUIDE PORT INSERTION LEFT 12/22/2016 [facility]   IR GENERIC HISTORICAL  12/22/2016   IR US GUIDE VASC ACCESS LEFT 12/22/2016 [facility]   IR GENERIC HISTORICAL  12/22/2016   IR CV LINE INJECTION 12/22/2016 [facility]   IR GENERIC HISTORICAL  12/22/2016   IR REMOVAL TUN ACCESS W/ PORT W/O FL MOD SED 12/22/2016 [facility]   VIDEO BRONCHOSCOPY Bilateral 11/26/2016   Procedure: VIDEO BRONCHOSCOPY WITH FLUORO;  Surgeon: Rigoberto Noel, MD;  Location[facility];  Service:  Cardiopulmonary;  Laterality: Bilateral;    . Social History   Tobacco Use   Smoking status: Former    Packs/day: 0.50    Years: 15.00    Pack years: 7.50    Types: Cigarettes    Quit date: 03/27/2016    Years since quitting: 5.3   Smokeless tobacco: Never  Vaping Use   Vaping Use: Never used  Substance Use Topics   Alcohol use: Yes     Comment: occasional now    ALLERGIES:  has No Known Allergies.  MEDICATIONS:  Current Outpatient Medications  Medication Sig Dispense Refill   acyclovir (ZOVIRAX) 400 MG tablet Take 1 tablet (400 mg total) by mouth 2 (two) times daily. Start after completion of Valtrex (Patient not taking: Reported on 01/11/2020) 60 tablet 6   amitriptyline (ELAVIL) 10 MG tablet Take 1 tablet (10 mg total) by mouth at bedtime. (Patient not taking: Reported on 01/11/2020) 20 tablet 0   gabapentin (NEURONTIN) 300 MG capsule Take 1 capsule (300 mg total) by mouth 2 (two) times daily. (Patient not taking: Reported on 01/11/2020) 60 capsule 0   oxyCODONE-acetaminophen (PERCOCET/ROXICET) 5-325 MG tablet Take 1-2 tablets by mouth every 6 (six) hours as needed for severe pain. (Patient not taking: Reported on 01/11/2020) 50 tablet 0   No current facility-administered medications for this visit.   Facility-Administered Medications Ordered in Other Visits  Medication Dose Route Frequency Provider Last Rate Last Admin   sodium chloride flush (NS) 0.9 % injection 10 mL  10 mL Intracatheter PRN Brunetta Genera, MD        PHYSICAL EXAMINATION: ECOG FS:1 - Symptomatic but completely ambulatory  There were no vitals filed for this visit. Wt Readings from Last 3 Encounters:  06/22/21 140 lb (63.5 kg)  02/18/21 146 lb 14.4 oz (66.6 kg)  01/07/21 140 lb 6.4 oz (63.7 kg)   There is no height or weight on file to calculate BMI.    NAD GENERAL:alert, in no acute distress and comfortable SKIN: no acute rashes, no significant lesions EYES: conjunctiva are pink and non-injected, sclera anicteric OROPHARYNX: MMM, no exudates, no oropharyngeal erythema or ulceration NECK: supple, no JVD LYMPH:  no palpable lymphadenopathy in the cervical, axillary or inguinal regions LUNGS: clear to auscultation b/l with normal respiratory effort HEART: regular rate & rhythm ABDOMEN:  normoactive bowel sounds , non tender, not  distended. Extremity: no pedal edema PSYCH: alert & oriented x 3 with fluent speech NEURO: no focal motor/sensory deficits  LABORATORY DATA:   I have reviewed the data as listed  CBC Latest Ref Rng & Units 06/22/2021 02/17/2021 01/07/2021  WBC 4.0 - 10.5 K/uL 3.9(L) 3.9(L) 2.4(L)  Hemoglobin 12.0 - 15.0 g/dL 13.8 11.2(L) 13.1  Hematocrit 36.0 - 46.0 % 41.0 33.7(L) 39.6  Platelets 150 - 400 K/uL 201 163 183   . CBC    Component Value Date/Time   WBC 3.9 (L) 06/22/2021 0932   RBC 4.11 06/22/2021 0932   HGB 13.8 06/22/2021 0932   HGB 10.6 (L) 11/07/2018 1047   HGB 11.7 07/20/2017 1315   HCT 41.0 06/22/2021 0932   HCT 35.2 07/20/2017 1315   PLT 201 06/22/2021 0932   PLT 473 (H) 11/07/2018 1047   PLT 266 07/20/2017 1315   MCV 99.8 06/22/2021 0932   MCV 93.2 07/20/2017 1315   MCH 33.6 06/22/2021 0932   MCHC 33.7 06/22/2021 0932   RDW 11.9 06/22/2021 0932   RDW 14.5 07/20/2017 1315   LYMPHSABS 0.5 (L) 02/17/2021  1351   LYMPHSABS 0.3 (L) 07/20/2017 1315   MONOABS 0.6 02/17/2021 1351   MONOABS 0.5 07/20/2017 1315   EOSABS 0.1 02/17/2021 1351   EOSABS 0.2 07/20/2017 1315   BASOSABS 0.0 02/17/2021 1351   BASOSABS 0.0 07/20/2017 1315   . CMP Latest Ref Rng & Units 06/22/2021 02/17/2021 01/07/2021  Glucose 70 - 99 mg/dL 68(L) 84 95  BUN 6 - 20 mg/dL 9 10 6   Creatinine 0.44 - 1.00 mg/dL 0.89 0.97 0.84  Sodium 135 - 145 mmol/L 137 138 139  Potassium 3.5 - 5.1 mmol/L 3.9 4.3 4.0  Chloride 98 - 111 mmol/L 98 102 101  CO2 22 - 32 mmol/L 22 27 27   Calcium 8.9 - 10.3 mg/dL 9.3 8.8(L) 9.6  Total Protein 6.5 - 8.1 g/dL 7.1 7.0 7.9  Total Bilirubin 0.3 - 1.2 mg/dL 1.0 0.5 0.5  Alkaline Phos 38 - 126 U/L 53 59 80  AST 15 - 41 U/L 106(H) 29 40  ALT 0 - 44 U/L 54(H) 24 29     RADIOGRAPHIC STUDIES: I have personally reviewed the radiological images as listed and agreed with the findings in the report. CT Angio Chest PE W/Cm &/Or Wo Cm  Result Date: 06/22/2021 CLINICAL DATA:   Shortness of breath.  History of Hodgkin's lymphoma EXAM: CT ANGIOGRAPHY CHEST WITH CONTRAST TECHNIQUE: Multidetector CT imaging of the chest was performed using the standard protocol during bolus administration of intravenous contrast. Multiplanar CT image reconstructions and MIPs were obtained to evaluate the vascular anatomy. CONTRAST:  35mL OMNIPAQUE IOHEXOL 350 MG/ML SOLN COMPARISON:  Chest CT February 17, 2021; chest radiograph June 22, 2021 FINDINGS: Cardiovascular: No demonstrable pulmonary embolus. Note that there is fibrosis in the perihilar region on the right causing mild narrowing of several pulmonary arterial vessels on the right, stable from prior study. There is no thoracic aortic aneurysm or dissection. Visualized great vessels appear unremarkable. There is evidence of a degree of left ventricular hypertrophy. No pericardial effusion or pericardial thickening. Main pulmonary outflow tract measures 3.1 cm, mildly prominent. Port-A-Cath tip in superior vena cava. Mediastinum/Nodes: 5 mm nodular opacity in the left lobe of the thyroid is stable; no additional imaging surveillance of this small nodular opacity warranted per consensus guidelines. No evident thoracic adenopathy. No esophageal lesions appreciable. Lungs/Pleura: There is stable scarring in the right upper lobe with localized reticular thickening. 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 There is no appreciable new opacity on the right. On the left, there are areas of relative mosaic attenuation without well-defined airspace opacity. No pleural effusions evident on either side. No pneumothorax. Trachea and major bronchial structures appear patent. Upper Abdomen: There is hepatic steatosis. Visualized upper abdominal structures otherwise appear unremarkable. Musculoskeletal: Stable sclerotic  foci in the thoracic spine. No new bone lesions evident. No appreciable chest wall lesions. Review of the MIP images confirms the above findings. IMPRESSION: 1. No appreciable pulmonary embolus. No thoracic aortic aneurysm or dissection. There is left ventricular hypertrophy. 2. Prominence of the main pulmonary outflow tract may indicate a degree of pulmonary arterial hypertension. 3. Fibrosis on the right with volume loss and post radiation therapy changes, essentially stable from prior study. 4. Areas of mosaic attenuation on the left may represent a degree of underlying small airways obstructive disease. No edema or consolidation appreciable. No pleural effusions. 5.  No evident adenopathy  by size criteria. 6.  Bony changes indicative of treated lymphoma, stable. 7.  Hepatic steatosis. Electronically Signed   By: Lowella Grip III M.D.   On: 06/22/2021 12:31   DG Chest Port 1 View  Result Date: 06/22/2021 CLINICAL DATA:  Chest pain EXAM: PORTABLE CHEST 1 VIEW COMPARISON:  02/17/2021 CT FINDINGS: Cardiac shadow is enlarged but stable. Left chest wall port is again noted. Central changes of radiation fibrosis are again seen similar to that noted on prior CT with volume loss in the right lung. Left lung is hyperinflated. No focal infiltrate or sizable effusion is seen. No bony abnormality is noted. 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 No new focal abnormality is noted. Electronically Signed   By: Inez Catalina M.D.   On: 06/22/2021 09:49    ASSESSMENT & PLAN:   35 y.o. African-American female with  #1 Refractory/Progressive Mixed cellularity Hodgkin's lymphoma IV BE with extensive lymphadenopathy including right axillary, mediastinal and upper retroperitoneal and now biopsy proven pulmonary involvement. She was noted to have significant constitutional symptoms including significant weight loss, fevers chills and some night sweats. HIV negative Hepatitis C and hepatitis B  serologies negative. Echo with normal ejection fraction. Patient was treated with 5 cycles of AVD (Bleomycin held due to poor DLCO 37% and ongoing smoking). Multiple avoidable treatment delays due to the patient's noncompliance with follow-up for avoidable reasons. She has been counseled repeatedly that this would increase the likelihood of unfavorable outcome.   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 S/p 6 cycles of 2nd line treatment with Bendamustine/Brentuximab And 1 cycle of Bretuximab alone   -PET/CT scan results from 04/14/2017 were discussed in details. She appears to have some persistent disease in her right lung at Deauville 5. It is difficult to say if this is recurrent or persistent disease since the patient failed to follow-up on multiple scheduled PET/CT scans in the early part and prior to her second line treatment.   Patient was lost to followup for >1 yr   09/26/18 PET/CT revealed Imaging findings compatible with recurrence of disease. 2. Multiple new large areas of hypermetabolic nodularity and airspace consolidation within both lungs which is presumed to represent pulmonary involvement by lymphoma. Deauville criteria 5. 3. New hypermetabolic left supraclavicular, left retroperitoneal, and bilateral pelvic lymph nodes. Deauville criteria 5. 4. Multifocal hypermetabolic osseous lesions.  Deauville criteria 5. 5. Small volume of ascites, new.   12/06/18 PET/CT revealed Generally improved appearance with previously mostly Deauville 5 activity now mostly Deauville 4 activity. 2. Layering of much of the airspace opacity in the lungs, although considerable right perihilar airspace opacity remains. The remaining pulmonary opacities are assess this Deauville 5 on the right and over L4 on the left, and were previously Deauville 5 bilaterally. 3. Mildly reduced size and moderately reduced activity in the abdominopelvic lymph nodes which are now dove L4. 4. The  previously seen left supraclavicular lymph node seems to have completely resolved (Deauville 0). 5. The skeletal lesions remain at Deauville 4, although in absolute terms have decreased in SUV compared to previous. 6. No new regions of malignant involvement compared to prior exam. 7. Other imaging findings of potential clinical significance: Low-density blood pool suggests anemia. Pectus excavatum. Volume loss in the right hemithorax.  06/13/19 PET/CT revealed "No abnormal hypermetabolism (Deauville 1). 2. Mixed lytic and sclerotic osseous lesions appear more prominent than on 12/06/2018 but do not have associated hypermetabolism, indicative of interval healing." 01/31/2020 CT  ABDOMEN PELVIS W CONTRAST (Accession [id number]) and CT CHEST W CONTRAST (Accession [id number]) revealed "1. Small pre pericardial lymph node, not present on the prior study. 2. Stable volume loss in the right chest with shift of mediastinal structures into the right chest. 3. No change in the appearance of multifocal bony sclerosis, without signs of FDG uptake on the previous exam, attention on follow-up."   #2 s/p hypoxic respiratory failure with dense right lung consolidation and left upper lobe consolidation with SVC syndrome. Patient has completed palliative radiation to the right lung mass causing SVC compression.  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   #4 Non compliance with clinic and treatment followup. Missed 2nd dose of bendamustine with C2. Has missed multiple appointment for her PET/CT and missed appointment at [facility] for consideration of Transplant.  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  #8 Abnormal LFTs ? Related to Pembrolizumab - stable today  PLAN: -Discussed pt labwork today, 07/14/2021; CBC, cmp wnl, sed rate wnl @ 2 -No overt lab or clinical evidence of CHL progression at this time. -patient continues to be off pembrolizumab per her choice -Will see back in 4 months. -patient is aware of symptoms to monitor for and call us if any new concerns.  FOLLOW UP: Portflush every 2 months x 6 RTC with Dr Irene Limbo with portflush and labs in 4 months    The total time spent in the appointment was 20 minutes and more than 50% was on counseling and direct patient cares.  All of the patient's questions were answered with apparent satisfaction. The patient knows to call the clinic with any problems, questions or concerns.   Sullivan Lone MD [place] AAHIVMS [facility] Hematology/Oncology Physician [facility]  (Office):       [phone] (Work cell):  [phone] (Fax):           [phone]  I, Reinaldo Raddle, am acting as scribe for Dr. Sullivan Lone, MD.  .I have reviewed the above documentation for accuracy and completeness, and I agree with the above. Brunetta Genera MD

## 2021-07-15 ENCOUNTER — Other Ambulatory Visit: Payer: Self-pay

## 2021-07-15 ENCOUNTER — Inpatient Hospital Stay (HOSPITAL_BASED_OUTPATIENT_CLINIC_OR_DEPARTMENT_OTHER): Payer: Medicaid Other | Admitting: Hematology

## 2021-07-15 ENCOUNTER — Inpatient Hospital Stay: Payer: Medicaid Other | Attending: Hematology

## 2021-07-15 VITALS — BP 142/87 | HR 70 | Temp 98.1°F | Resp 17 | Ht 67.0 in | Wt 138.4 lb

## 2021-07-15 DIAGNOSIS — Z9119 Patient's noncompliance with other medical treatment and regimen: Secondary | ICD-10-CM | POA: Diagnosis not present

## 2021-07-15 DIAGNOSIS — Z923 Personal history of irradiation: Secondary | ICD-10-CM | POA: Diagnosis not present

## 2021-07-15 DIAGNOSIS — Z79899 Other long term (current) drug therapy: Secondary | ICD-10-CM | POA: Insufficient documentation

## 2021-07-15 DIAGNOSIS — C8120 Mixed cellularity classical Hodgkin lymphoma, unspecified site: Secondary | ICD-10-CM | POA: Insufficient documentation

## 2021-07-15 DIAGNOSIS — D649 Anemia, unspecified: Secondary | ICD-10-CM | POA: Diagnosis not present

## 2021-07-15 DIAGNOSIS — Z9114 Patient's other noncompliance with medication regimen: Secondary | ICD-10-CM | POA: Diagnosis not present

## 2021-07-15 DIAGNOSIS — R590 Localized enlarged lymph nodes: Secondary | ICD-10-CM | POA: Insufficient documentation

## 2021-07-15 DIAGNOSIS — C8198 Hodgkin lymphoma, unspecified, lymph nodes of multiple sites: Secondary | ICD-10-CM | POA: Diagnosis not present

## 2021-07-15 DIAGNOSIS — F410 Panic disorder [episodic paroxysmal anxiety] without agoraphobia: Secondary | ICD-10-CM | POA: Diagnosis not present

## 2021-07-15 DIAGNOSIS — B029 Zoster without complications: Secondary | ICD-10-CM | POA: Insufficient documentation

## 2021-07-15 DIAGNOSIS — R634 Abnormal weight loss: Secondary | ICD-10-CM | POA: Diagnosis not present

## 2021-07-15 DIAGNOSIS — R61 Generalized hyperhidrosis: Secondary | ICD-10-CM | POA: Insufficient documentation

## 2021-07-15 DIAGNOSIS — R188 Other ascites: Secondary | ICD-10-CM | POA: Insufficient documentation

## 2021-07-15 DIAGNOSIS — R079 Chest pain, unspecified: Secondary | ICD-10-CM | POA: Insufficient documentation

## 2021-07-15 DIAGNOSIS — R7989 Other specified abnormal findings of blood chemistry: Secondary | ICD-10-CM | POA: Insufficient documentation

## 2021-07-15 DIAGNOSIS — K760 Fatty (change of) liver, not elsewhere classified: Secondary | ICD-10-CM | POA: Diagnosis not present

## 2021-07-15 DIAGNOSIS — I119 Hypertensive heart disease without heart failure: Secondary | ICD-10-CM | POA: Diagnosis not present

## 2021-07-15 DIAGNOSIS — Q676 Pectus excavatum: Secondary | ICD-10-CM | POA: Insufficient documentation

## 2021-07-15 DIAGNOSIS — Z87891 Personal history of nicotine dependence: Secondary | ICD-10-CM | POA: Insufficient documentation

## 2021-07-15 DIAGNOSIS — R6883 Chills (without fever): Secondary | ICD-10-CM | POA: Insufficient documentation

## 2021-07-15 LAB — CBC WITH DIFFERENTIAL/PLATELET
Abs Immature Granulocytes: 0 10*3/uL (ref 0.00–0.07)
Basophils Absolute: 0 10*3/uL (ref 0.0–0.1)
Basophils Relative: 0 %
Eosinophils Absolute: 0.1 10*3/uL (ref 0.0–0.5)
Eosinophils Relative: 4 %
HCT: 37 % (ref 36.0–46.0)
Hemoglobin: 12.3 g/dL (ref 12.0–15.0)
Immature Granulocytes: 0 %
Lymphocytes Relative: 14 %
Lymphs Abs: 0.3 10*3/uL — ABNORMAL LOW (ref 0.7–4.0)
MCH: 33.2 pg (ref 26.0–34.0)
MCHC: 33.2 g/dL (ref 30.0–36.0)
MCV: 100 fL (ref 80.0–100.0)
Monocytes Absolute: 0.4 10*3/uL (ref 0.1–1.0)
Monocytes Relative: 18 %
Neutro Abs: 1.5 10*3/uL — ABNORMAL LOW (ref 1.7–7.7)
Neutrophils Relative %: 64 %
Platelets: 173 10*3/uL (ref 150–400)
RBC: 3.7 MIL/uL — ABNORMAL LOW (ref 3.87–5.11)
RDW: 11.9 % (ref 11.5–15.5)
WBC: 2.3 10*3/uL — ABNORMAL LOW (ref 4.0–10.5)
nRBC: 0 % (ref 0.0–0.2)

## 2021-07-15 LAB — CMP (CANCER CENTER ONLY)
ALT: 43 U/L (ref 0–44)
AST: 55 U/L — ABNORMAL HIGH (ref 15–41)
Albumin: 4.1 g/dL (ref 3.5–5.0)
Alkaline Phosphatase: 58 U/L (ref 38–126)
Anion gap: 11 (ref 5–15)
BUN: 6 mg/dL (ref 6–20)
CO2: 31 mmol/L (ref 22–32)
Calcium: 9.8 mg/dL (ref 8.9–10.3)
Chloride: 102 mmol/L (ref 98–111)
Creatinine: 0.84 mg/dL (ref 0.44–1.00)
GFR, Estimated: >60 mL/min (ref >60)
Glucose, Bld: 53 mg/dL — ABNORMAL LOW (ref 70–99)
Potassium: 4.2 mmol/L (ref 3.5–5.1)
Sodium: 144 mmol/L (ref 135–145)
Total Bilirubin: 0.5 mg/dL (ref 0.3–1.2)
Total Protein: 7.5 g/dL (ref 6.5–8.1)

## 2021-07-15 LAB — SEDIMENTATION RATE: Sed Rate: 2 mm/hr (ref 0–22)

## 2021-07-16 ENCOUNTER — Telehealth: Payer: Self-pay | Admitting: Hematology

## 2021-07-16 NOTE — Telephone Encounter (Signed)
Left message with follow-up appointments per 7/20 los. 

## 2021-07-21 ENCOUNTER — Encounter: Payer: Self-pay | Admitting: Hematology

## 2021-08-24 ENCOUNTER — Institutional Professional Consult (permissible substitution): Payer: Medicaid Other | Admitting: Pulmonary Disease

## 2021-08-26 ENCOUNTER — Inpatient Hospital Stay: Payer: Medicaid Other

## 2021-08-28 ENCOUNTER — Inpatient Hospital Stay: Payer: Medicaid Other | Attending: Hematology

## 2021-10-07 ENCOUNTER — Inpatient Hospital Stay: Payer: Medicaid Other

## 2021-10-09 ENCOUNTER — Telehealth: Payer: Self-pay | Admitting: Hematology

## 2021-10-09 NOTE — Telephone Encounter (Signed)
Rescheduled per 10/13 in basket, pt has been called and confirmed this appt. Pt said they will call back if unable to make appt due to work.

## 2021-10-12 ENCOUNTER — Inpatient Hospital Stay: Payer: Medicaid Other | Attending: Hematology

## 2021-10-12 ENCOUNTER — Other Ambulatory Visit: Payer: Self-pay

## 2021-10-12 DIAGNOSIS — Z95828 Presence of other vascular implants and grafts: Secondary | ICD-10-CM

## 2021-10-12 DIAGNOSIS — Z7189 Other specified counseling: Secondary | ICD-10-CM

## 2021-10-12 DIAGNOSIS — Z452 Encounter for adjustment and management of vascular access device: Secondary | ICD-10-CM | POA: Insufficient documentation

## 2021-10-12 DIAGNOSIS — C8198 Hodgkin lymphoma, unspecified, lymph nodes of multiple sites: Secondary | ICD-10-CM | POA: Insufficient documentation

## 2021-10-12 MED ORDER — SODIUM CHLORIDE 0.9% FLUSH
10.0000 mL | INTRAVENOUS | Status: DC | PRN
  Administered 2021-10-12: 10 mL

## 2021-10-12 MED ORDER — HEPARIN SOD (PORK) LOCK FLUSH 100 UNIT/ML IV SOLN
500.0000 [IU] | Freq: Once | INTRAVENOUS | Status: AC | PRN
  Administered 2021-10-12: 500 [IU]

## 2021-11-18 ENCOUNTER — Inpatient Hospital Stay: Payer: Medicaid Other | Attending: Hematology | Admitting: Hematology

## 2021-11-18 ENCOUNTER — Inpatient Hospital Stay: Payer: Medicaid Other

## 2021-12-09 ENCOUNTER — Encounter (HOSPITAL_COMMUNITY): Payer: Self-pay

## 2021-12-09 ENCOUNTER — Ambulatory Visit (HOSPITAL_COMMUNITY)
Admission: RE | Admit: 2021-12-09 | Discharge: 2021-12-09 | Disposition: A | Discharge status: Home / Self Care | Payer: Medicaid Other | Source: Ambulatory Visit | Attending: Urgent Care | Admitting: Urgent Care

## 2021-12-09 ENCOUNTER — Other Ambulatory Visit: Payer: Self-pay

## 2021-12-09 ENCOUNTER — Ambulatory Visit (INDEPENDENT_AMBULATORY_CARE_PROVIDER_SITE_OTHER): Payer: Medicaid Other

## 2021-12-09 VITALS — BP 174/111 | HR 76 | Temp 99.0°F | Resp 18

## 2021-12-09 DIAGNOSIS — Z20822 Contact with and (suspected) exposure to covid-19: Secondary | ICD-10-CM | POA: Insufficient documentation

## 2021-12-09 DIAGNOSIS — J45909 Unspecified asthma, uncomplicated: Secondary | ICD-10-CM | POA: Insufficient documentation

## 2021-12-09 DIAGNOSIS — J189 Pneumonia, unspecified organism: Secondary | ICD-10-CM | POA: Insufficient documentation

## 2021-12-09 DIAGNOSIS — R051 Acute cough: Secondary | ICD-10-CM | POA: Diagnosis present

## 2021-12-09 DIAGNOSIS — R0981 Nasal congestion: Secondary | ICD-10-CM | POA: Diagnosis present

## 2021-12-09 DIAGNOSIS — R059 Cough, unspecified: Secondary | ICD-10-CM | POA: Diagnosis not present

## 2021-12-09 DIAGNOSIS — Z79899 Other long term (current) drug therapy: Secondary | ICD-10-CM | POA: Insufficient documentation

## 2021-12-09 DIAGNOSIS — Z8709 Personal history of other diseases of the respiratory system: Secondary | ICD-10-CM | POA: Insufficient documentation

## 2021-12-09 DIAGNOSIS — Z8571 Personal history of Hodgkin lymphoma: Secondary | ICD-10-CM | POA: Insufficient documentation

## 2021-12-09 DIAGNOSIS — Z86718 Personal history of other venous thrombosis and embolism: Secondary | ICD-10-CM | POA: Diagnosis not present

## 2021-12-09 LAB — SARS CORONAVIRUS 2 (TAT 6-24 HRS): SARS Coronavirus 2: NEGATIVE

## 2021-12-09 LAB — POC INFLUENZA A AND B ANTIGEN (URGENT CARE ONLY)
INFLUENZA A ANTIGEN, POC: NEGATIVE
INFLUENZA B ANTIGEN, POC: NEGATIVE

## 2021-12-09 MED ORDER — BENZONATATE 100 MG PO CAPS: 100.0000 mg | ORAL_CAPSULE | Freq: Three times a day (TID) | ORAL | 0 refills | Status: DC | PRN

## 2021-12-09 MED ORDER — AMOXICILLIN 500 MG PO CAPS: 1000.0000 mg | ORAL_CAPSULE | Freq: Three times a day (TID) | ORAL | 0 refills | Status: DC

## 2021-12-09 MED ORDER — CETIRIZINE HCL 10 MG PO TABS: 10.0000 mg | ORAL_TABLET | Freq: Every day | ORAL | 0 refills | Status: DC

## 2021-12-09 MED ORDER — PROMETHAZINE-DM 6.25-15 MG/5ML PO SYRP: 5.0000 mL | ORAL_SOLUTION | Freq: Every evening | ORAL | 0 refills | Status: DC | PRN

## 2021-12-09 MED ORDER — AZITHROMYCIN 250 MG PO TABS: ORAL_TABLET | ORAL | 0 refills | Status: DC

## 2021-12-09 NOTE — ED Provider Notes (Signed)
[place]   MRN: [id number] DOB: [birth year]  Subjective:   Heidi [name] is a 35 y.o. female with PMH of Hodgkin lymphoma, hospital-acquired pneumonia, acute respiratory failure with hypoxia, DVT, anemia of chronic disease, asthma presenting for 11-day history of persistent sinus congestion, chest congestion and coughing.  Has been using over-the-counter medications without relief.  No chest pain, shortness of breath or wheezing, body aches, fevers.  Patient is currently in remission for her Hodgkin lymphoma.  Gets regular follow-up with her oncologist.  Patient is not a smoker.  No current facility-administered medications for this encounter.  Current Outpatient Medications:    acyclovir (ZOVIRAX) 400 MG tablet, Take 1 tablet (400 mg total) by mouth 2 (two) times daily. Start after completion of Valtrex, Disp: 60 tablet, Rfl: 6  Facility-Administered Medications Ordered in Other Encounters:    sodium chloride flush (NS) 0.9 % injection 10 mL, 10 mL, Intracatheter, PRN, Irene Limbo, Cloria Spring, MD   No Known Allergies  Past Medical History:  Diagnosis Date   ARDS (adult respiratory distress syndrome) ([place])    Asthma    Hodgkin lymphoma ([place])    Hypertension      Past Surgical History:  Procedure Laterality Date   AXILLARY LYMPH NODE BIOPSY Right 03/19/2016   Procedure: AXILLARY LYMPH NODE BIOPSY;  Surgeon: Armandina Gemma, MD;  [facility];  Service: General;  Laterality: Right;   IR GENERIC HISTORICAL  12/22/2016   IR FLUORO GUIDE PORT INSERTION LEFT 12/22/2016 [facility]   IR GENERIC HISTORICAL  12/22/2016   IR US GUIDE VASC ACCESS LEFT 12/22/2016 [facility]   IR GENERIC HISTORICAL  12/22/2016   IR CV LINE INJECTION 12/22/2016 [facility]   IR GENERIC HISTORICAL  12/22/2016   IR REMOVAL TUN ACCESS W/ PORT W/O FL MOD SED 12/22/2016 [facility]   VIDEO BRONCHOSCOPY Bilateral 11/26/2016   Procedure: VIDEO BRONCHOSCOPY WITH FLUORO;  Surgeon:  Rigoberto Noel, MD;  [facility];  Service: Cardiopulmonary;  Laterality: Bilateral;    History reviewed. No pertinent family history.  Social History   Tobacco Use   Smoking status: Former    Packs/day: 0.50    Years: 15.00    Pack years: 7.50    Types: Cigarettes    Quit date: 03/27/2016    Years since quitting: 5.7   Smokeless tobacco: Never  Vaping Use   Vaping Use: Never used  Substance Use Topics   Alcohol use: Yes    Comment: occasional now   Drug use: Not Currently    ROS   Objective:   Vitals: BP (!) 174/111 (BP Location: Left Arm)    Pulse 76    Temp 99 F (37.2 C) (Oral)    Resp 18    LMP 12/05/2021    SpO2 94%   BP Readings from Last 3 Encounters:  12/09/21 (!) 174/111  07/15/21 (!) 142/87  06/22/21 (!) 144/99   Physical Exam Constitutional:      General: She is not in acute distress.    Appearance: Normal appearance. She is well-developed. She is not ill-appearing, toxic-appearing or diaphoretic.  HENT:     Head: Normocephalic and atraumatic.     Right Ear: External ear normal.     Left Ear: External ear normal.     Nose: Congestion present. No rhinorrhea.     Mouth/Throat:     Mouth: Mucous membranes are moist.     Pharynx: No oropharyngeal exudate or posterior oropharyngeal erythema.  Eyes:  General: No scleral icterus.       Right eye: No discharge.        Left eye: No discharge.     Extraocular Movements: Extraocular movements intact.     Conjunctiva/sclera: Conjunctivae normal.  Cardiovascular:     Rate and Rhythm: Normal rate and regular rhythm.     Pulses: Normal pulses.     Heart sounds: Normal heart sounds. No murmur heard.   No friction rub. No gallop.  Pulmonary:     Effort: Pulmonary effort is normal. No respiratory distress.     Breath sounds: Normal breath sounds. No stridor. No wheezing, rhonchi or rales.  Skin:    General: Skin is warm and dry.     Findings: No rash.  Neurological:     Mental Status: She is  alert and oriented to person, place, and time.  Psychiatric:        Mood and Affect: Mood normal.        Behavior: Behavior normal.        Thought Content: Thought content normal.        Judgment: Judgment normal.    Results for orders placed or performed during the hospital encounter of 12/09/21 (from the past 24 hour(s))  POC Influenza A & B Ag (Urgent Care)     Status: None   Collection Time: 12/09/21 12:31 PM  Result Value Ref Range   INFLUENZA A ANTIGEN, POC NEGATIVE NEGATIVE   INFLUENZA B ANTIGEN, POC NEGATIVE NEGATIVE   DG Chest 2 View  Result Date: 12/09/2021 CLINICAL DATA:  persistent cough EXAM: CHEST - 2 VIEW COMPARISON:  June 22, 2021. FINDINGS: Increased interstitialopacities in the right midlung in the region of radiation fibrosis. Associated volume loss. Left lung is clear. No visible pleural effusions or pneumothorax. Cardiomediastinal silhouette is similar. Similar position a left-sided Port-A-Cath with the tip projecting at the superior cavoatrial junction. Areas of bony sclerosis better characterized on prior CT. IMPRESSION: Increased interstitialopacities in the right midlung in the region of radiation fibrosis, which could represent progressive fibrosis and/or superimposed pneumonia. A CT could further characterize if clinically indicated. Electronically Signed   By: Margaretha Sheffield M.D.   On: 12/09/2021 12:32    Assessment and Plan :   PDMP not reviewed this encounter.  1. Pneumonia of right middle lobe due to infectious organism   2. Acute cough   3. Sinus congestion   4. History of Hodgkin's lymphoma   5. History of respiratory failure   6. History of asthma    Will manage for community acquired pneumonia with amoxicillin and azithromycin.  Recommend supportive care otherwise.  At this stage, I do not believe that patient should report to the emergency room for a CT scan of her chest.  She does not have a primary care provider and therefore cannot explore that  route for an outpatient CT.  Discussed this with patient and emphasized need for her to establish care with a primary care provider.  In the meantime, if she has no improvement she will present to the emergency room for further testing such as the recommended chest CT scan. Counseled patient on potential for adverse effects with medications prescribed/recommended today, ER and return-to-clinic precautions discussed, patient verbalized understanding.    Jaynee Eagles, PA-C 12/09/21 1246

## 2021-12-09 NOTE — ED Triage Notes (Signed)
Pt c/o cough and congestion since 12/03. States using OTC meds with mild relief. States cough is worse at night.

## 2021-12-30 ENCOUNTER — Inpatient Hospital Stay: Payer: Medicaid Other | Attending: Hematology

## 2022-01-27 ENCOUNTER — Encounter: Payer: Self-pay | Admitting: Hematology

## 2022-01-27 ENCOUNTER — Other Ambulatory Visit: Payer: Self-pay

## 2022-01-27 ENCOUNTER — Inpatient Hospital Stay: Payer: Medicaid Other | Attending: Hematology

## 2022-01-27 DIAGNOSIS — Z452 Encounter for adjustment and management of vascular access device: Secondary | ICD-10-CM | POA: Insufficient documentation

## 2022-01-27 DIAGNOSIS — C8198 Hodgkin lymphoma, unspecified, lymph nodes of multiple sites: Secondary | ICD-10-CM | POA: Insufficient documentation

## 2022-01-27 DIAGNOSIS — Z7189 Other specified counseling: Secondary | ICD-10-CM

## 2022-01-27 DIAGNOSIS — Z95828 Presence of other vascular implants and grafts: Secondary | ICD-10-CM

## 2022-01-27 MED ORDER — HEPARIN SOD (PORK) LOCK FLUSH 100 UNIT/ML IV SOLN
500.0000 [IU] | Freq: Once | INTRAVENOUS | Status: AC | PRN
  Administered 2022-01-27: 500 [IU]

## 2022-01-27 MED ORDER — SODIUM CHLORIDE 0.9% FLUSH
10.0000 mL | INTRAVENOUS | Status: DC | PRN
  Administered 2022-01-27: 10 mL

## 2022-01-28 ENCOUNTER — Encounter (HOSPITAL_COMMUNITY): Payer: Self-pay

## 2022-01-28 ENCOUNTER — Ambulatory Visit (HOSPITAL_COMMUNITY)
Admission: RE | Admit: 2022-01-28 | Discharge: 2022-01-28 | Disposition: A | Discharge status: Home / Self Care | Payer: Medicaid Other | Source: Ambulatory Visit | Attending: Family Medicine | Admitting: Family Medicine

## 2022-01-28 ENCOUNTER — Encounter: Payer: Self-pay | Admitting: Hematology

## 2022-01-28 VITALS — BP 138/91 | HR 84 | Temp 98.9°F

## 2022-01-28 DIAGNOSIS — K529 Noninfective gastroenteritis and colitis, unspecified: Secondary | ICD-10-CM

## 2022-01-28 MED ORDER — ONDANSETRON 4 MG PO TBDP: 4.0000 mg | ORAL_TABLET | Freq: Three times a day (TID) | ORAL | 0 refills | Status: DC | PRN

## 2022-01-28 NOTE — ED Provider Notes (Signed)
[facility]   [id number] 01/28/22 Arrival Time: 2202  ASSESSMENT & PLAN:  1. Gastroenteritis    No signs of dehydration requiring IVF.  Meds ordered this encounter  Medications   ondansetron (ZOFRAN-ODT) 4 MG disintegrating tablet    Sig: Take 1 tablet (4 mg total) by mouth every 8 (eight) hours as needed for nausea or vomiting.    Dispense:  15 tablet    Refill:  0   Discussed typical duration of symptoms for suspected viral GI illness. Will do her best to ensure adequate fluid intake in order to avoid dehydration. Will proceed to the Emergency Department for evaluation if unable to tolerate PO fluids regularly. Work note provided.   Reviewed expectations re: course of current medical issues. Questions answered. Outlined signs and symptoms indicating need for more acute intervention. Patient verbalized understanding. After Visit Summary given.   SUBJECTIVE: History from: patient.  Heidi [name] is a 36 y.o. female who presents with complaint of non-bilious, non-bloody intermittent n/v with non-bloody diarrhea. Onset  2 d ago . Abdominal discomfort: mild and cramping. Symptoms are gradually improving since beginning. Aggravating factors: eating. Alleviating factors: none identified. Associated symptoms: fatigue. She denies chills and fever. Appetite: decreased. PO intake: decreased. Ambulatory without assistance. Urinary symptoms: denied Sick contacts: none. Recent travel or camping: none. OTC treatment: none.  No LMP recorded (lmp unknown). (Menstrual status: Irregular Periods).  Past Surgical History:  Procedure Laterality Date   AXILLARY LYMPH NODE BIOPSY Right 03/19/2016   Procedure: AXILLARY LYMPH NODE BIOPSY;  Surgeon: Armandina Gemma, MD;  Location: [facility];  Service: General;  Laterality: Right;   IR GENERIC HISTORICAL  12/22/2016   IR FLUORO GUIDE PORT INSERTION LEFT 12/22/2016 [facility]   IR GENERIC HISTORICAL  12/22/2016   IR US GUIDE VASC ACCESS  LEFT 12/22/2016 [facility]   IR GENERIC HISTORICAL  12/22/2016   IR CV LINE INJECTION 12/22/2016 [facility]   IR GENERIC HISTORICAL  12/22/2016   IR REMOVAL TUN ACCESS W/ PORT W/O FL MOD SED 12/22/2016 [facility]   VIDEO BRONCHOSCOPY Bilateral 11/26/2016   Procedure: VIDEO BRONCHOSCOPY WITH FLUORO;  Surgeon: Rigoberto Noel, MD;  [facility];  Service: Cardiopulmonary;  Laterality: Bilateral;    OBJECTIVE:  Vitals:   01/28/22 1104  BP: (!) 138/91  Pulse: 84  Temp: 98.9 F (37.2 C)  TempSrc: Oral  SpO2: 100%    General appearance: alert; no distress Oropharynx: moist Lungs: clear to auscultation bilaterally; unlabored Heart: regular rate and rhythm Abdomen: soft; non-distended; no significant abdominal tenderness; no guarding or rebound tenderness Back: no CVA tenderness Extremities: no edema; symmetrical with no gross deformities Skin: warm; dry Neurologic: normal gait Psychological: alert and cooperative; normal mood and affect   No Known Allergies                                             Past Medical History:  Diagnosis Date   ARDS (adult respiratory distress syndrome) (HCC)    Asthma    Hodgkin lymphoma (HCC)    Hypertension    Social History   Socioeconomic History   Marital status: Single    Spouse name: Not on file   Number of children: Not on file   Years of education: Not on file   Highest education level: Not on file  Occupational History   Not on  file  Tobacco Use   Smoking status: Former    Packs/day: 0.50    Years: 15.00    Pack years: 7.50    Types: Cigarettes    Quit date: 03/27/2016    Years since quitting: 5.8   Smokeless tobacco: Never  Vaping Use   Vaping Use: Never used  Substance and Sexual Activity   Alcohol use: Yes    Comment: occasional now   Drug use: Not Currently   Sexual activity: Not on file  Other Topics Concern   Not on file  Social History Narrative   Not on file   Social Determinants of Health    Financial Resource Strain: Not on file  Food Insecurity: Not on file  Transportation Needs: Not on file  Physical Activity: Not on file  Stress: Not on file  Social Connections: Not on file  Intimate Partner Violence: Not on file   History reviewed. No pertinent family history.    Vanessa Kick, MD 01/28/22 [phone]

## 2022-01-28 NOTE — ED Triage Notes (Signed)
Pt presents with c/o N/V/D x 3 days.   Pt states she has had stomach pain and has not been at work. States she has worsened.

## 2022-01-28 NOTE — Discharge Instructions (Signed)
Please do your best to ensure adequate fluid intake in order to avoid dehydration. If you find that you are unable to tolerate drinking fluids regularly please proceed to the Emergency Department for evaluation. ° ° °

## 2022-02-10 ENCOUNTER — Inpatient Hospital Stay: Payer: Medicaid Other

## 2022-03-24 ENCOUNTER — Inpatient Hospital Stay: Payer: Medicaid Other | Attending: Hematology

## 2022-05-12 ENCOUNTER — Inpatient Hospital Stay: Payer: Medicaid Other | Attending: Hematology

## 2022-05-12 ENCOUNTER — Other Ambulatory Visit: Payer: Self-pay

## 2022-05-12 DIAGNOSIS — Z452 Encounter for adjustment and management of vascular access device: Secondary | ICD-10-CM | POA: Diagnosis not present

## 2022-05-12 DIAGNOSIS — C8128 Mixed cellularity classical Hodgkin lymphoma, lymph nodes of multiple sites: Secondary | ICD-10-CM | POA: Insufficient documentation

## 2022-05-12 DIAGNOSIS — Z95828 Presence of other vascular implants and grafts: Secondary | ICD-10-CM

## 2022-05-12 DIAGNOSIS — Z7189 Other specified counseling: Secondary | ICD-10-CM

## 2022-05-12 MED ORDER — SODIUM CHLORIDE 0.9% FLUSH
10.0000 mL | INTRAVENOUS | Status: DC | PRN
  Administered 2022-05-12: 10 mL

## 2022-05-12 MED ORDER — HEPARIN SOD (PORK) LOCK FLUSH 100 UNIT/ML IV SOLN
500.0000 [IU] | Freq: Once | INTRAVENOUS | Status: AC | PRN
  Administered 2022-05-12: 500 [IU]

## 2022-07-06 ENCOUNTER — Encounter: Payer: Self-pay | Admitting: Hematology

## 2022-11-15 ENCOUNTER — Encounter: Payer: Self-pay | Admitting: Hematology

## 2022-11-20 ENCOUNTER — Other Ambulatory Visit: Payer: Self-pay

## 2022-11-22 ENCOUNTER — Inpatient Hospital Stay: Payer: Medicaid Other | Attending: Hematology

## 2022-11-22 ENCOUNTER — Other Ambulatory Visit: Payer: Self-pay

## 2022-11-22 DIAGNOSIS — Z452 Encounter for adjustment and management of vascular access device: Secondary | ICD-10-CM | POA: Insufficient documentation

## 2022-11-22 DIAGNOSIS — C8128 Mixed cellularity classical Hodgkin lymphoma, lymph nodes of multiple sites: Secondary | ICD-10-CM | POA: Insufficient documentation

## 2022-11-22 DIAGNOSIS — Z7189 Other specified counseling: Secondary | ICD-10-CM

## 2022-11-22 DIAGNOSIS — Z95828 Presence of other vascular implants and grafts: Secondary | ICD-10-CM

## 2022-11-22 MED ORDER — HEPARIN SOD (PORK) LOCK FLUSH 100 UNIT/ML IV SOLN
500.0000 [IU] | Freq: Once | INTRAVENOUS | Status: AC | PRN
  Administered 2022-11-22: 500 [IU]

## 2022-11-22 MED ORDER — SODIUM CHLORIDE 0.9% FLUSH
10.0000 mL | INTRAVENOUS | Status: DC | PRN
  Administered 2022-11-22: 10 mL

## 2022-11-22 NOTE — Patient Instructions (Signed)

## 2022-11-30 ENCOUNTER — Other Ambulatory Visit: Payer: Self-pay

## 2023-02-25 ENCOUNTER — Ambulatory Visit (HOSPITAL_COMMUNITY): Payer: Self-pay

## 2023-03-01 ENCOUNTER — Ambulatory Visit (HOSPITAL_COMMUNITY)
Admission: RE | Admit: 2023-03-01 | Discharge: 2023-03-01 | Disposition: A | Discharge status: Home / Self Care | Payer: Medicaid Other | Source: Ambulatory Visit | Attending: Family Medicine | Admitting: Family Medicine

## 2023-03-01 ENCOUNTER — Ambulatory Visit (INDEPENDENT_AMBULATORY_CARE_PROVIDER_SITE_OTHER): Payer: Medicaid Other

## 2023-03-01 ENCOUNTER — Encounter (HOSPITAL_COMMUNITY): Payer: Self-pay

## 2023-03-01 VITALS — BP 134/73 | HR 106 | Temp 98.8°F | Resp 18

## 2023-03-01 DIAGNOSIS — R311 Benign essential microscopic hematuria: Secondary | ICD-10-CM

## 2023-03-01 DIAGNOSIS — R109 Unspecified abdominal pain: Secondary | ICD-10-CM | POA: Diagnosis not present

## 2023-03-01 DIAGNOSIS — R1031 Right lower quadrant pain: Secondary | ICD-10-CM

## 2023-03-01 DIAGNOSIS — M545 Low back pain, unspecified: Secondary | ICD-10-CM

## 2023-03-01 LAB — POCT URINALYSIS DIPSTICK, ED / UC
Bilirubin Urine: NEGATIVE
Glucose, UA: NEGATIVE mg/dL
Ketones, ur: NEGATIVE mg/dL
Nitrite: NEGATIVE
Protein, ur: NEGATIVE mg/dL
Specific Gravity, Urine: 1.015 (ref 1.005–1.030)
Urobilinogen, UA: 2 mg/dL — ABNORMAL HIGH (ref 0.0–1.0)
pH: 7 (ref 5.0–8.0)

## 2023-03-01 LAB — POC URINE PREG, ED: Preg Test, Ur: NEGATIVE

## 2023-03-01 MED ORDER — KETOROLAC TROMETHAMINE 30 MG/ML IJ SOLN
INTRAMUSCULAR | Status: AC
  Filled 2023-03-01: qty 1

## 2023-03-01 MED ORDER — KETOROLAC TROMETHAMINE 30 MG/ML IJ SOLN
30.0000 mg | Freq: Once | INTRAMUSCULAR | Status: AC
  Administered 2023-03-01: 30 mg via INTRAMUSCULAR

## 2023-03-01 MED ORDER — SULFAMETHOXAZOLE-TRIMETHOPRIM 800-160 MG PO TABS: 1.0000 | ORAL_TABLET | Freq: Two times a day (BID) | ORAL | 0 refills | Status: AC

## 2023-03-01 NOTE — Discharge Instructions (Addendum)
You were seen today for back pain and abdominal pain.  Your xray did not show a kidney stone, but did show moderate stool.  Unclear if this is causing your symptoms.  I recommend you take over the counter mirilax for you constipation.  In the mean time I am treating you for a possible UTI.  I have sent out bactrim twice/day x 7 days.  If you have worsening pain, or develop fever, chills, nausea or vomiting then please go to the ER for further evaluation.

## 2023-03-01 NOTE — ED Triage Notes (Signed)
Pt states she has lower back pain mostly on right side radiating around to the from lower abd X 1 week. She has been pushing water, taking ASA and IBU without relief. She does states she has some urinary pressure.

## 2023-03-01 NOTE — ED Provider Notes (Signed)
[facility]    CSN: [id number] Arrival date & time: 03/01/23  0848      History   Chief Complaint Chief Complaint  Patient presents with   Abdominal Pain    Entered by patient   Back Pain    HPI Heidi [name] is a 37 y.o. female.   She started last week with right back, "kidney" pain.  She increased water intake, which did help.  But the pain has returned.  Having constant pain at the right lower back around the the right lower quadrant.  Pain is constant 10/10 pain.  Not able to sleep.  No urinary frequency, no burning.   No vaginal symptoms.  No fevers/chills.  No n/v.  No h/o kidney stones.  Some constipation.  She has taken otc asa, motrin tylenol without help.        Past Medical History:  Diagnosis Date   ARDS (adult respiratory distress syndrome) ([place])    Asthma    Hodgkin lymphoma ([place])    Hypertension     Patient Active Problem List   Diagnosis Date Noted   Post herpetic neuralgia    Hodgkin's lymphoma ([place]) 11/14/2018   Herpes zoster without complication    Counseling regarding advance care planning and goals of care 10/09/2018   Hodgkin's disease in adult [facility]) 10/09/2018   Port-A-Cath in place 09/28/2018   Pruritus 01/20/2017   Central line complication    Deep vein thrombosis (DVT) of upper extremity ([place])    SVC syndrome    Dyspnea 12/14/2016   Swelling 12/14/2016   Anemia of chronic disease 11/26/2016   CAP (community acquired pneumonia) 11/23/2016   PNA (pneumonia) 11/23/2016   Hyperpigmentation 06/04/2016   Hypersensitivity reaction 05/04/2016   Encounter for antineoplastic chemotherapy    Hypokalemia 04/20/2016   Volume overload 04/20/2016   S/P bronchoscopy with biopsy    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 ([place])    HCAP (healthcare-associated pneumonia) 04/10/2016   Hyponatremia 04/10/2016   Thrombocytosis 04/10/2016   Pneumonitis 03/16/2016   Postobstructive pneumonia 03/16/2016    Acute respiratory failure with hypoxia ([place]) 03/16/2016   Leukocytosis 03/16/2016   Sepsis due to pneumonia ([employer]) 03/16/2016   Lung mass    Lymphadenopathy     Past Surgical History:  Procedure Laterality Date   AXILLARY LYMPH NODE BIOPSY Right 03/19/2016   Procedure: AXILLARY LYMPH NODE BIOPSY;  Surgeon: Armandina Gemma, MD;  [facility];  Service: General;  Laterality: Right;   IR GENERIC HISTORICAL  12/22/2016   IR FLUORO GUIDE PORT INSERTION LEFT 12/22/2016 [facility]   IR GENERIC HISTORICAL  12/22/2016   IR US GUIDE VASC ACCESS LEFT 12/22/2016 [facility]   IR GENERIC HISTORICAL  12/22/2016   IR CV LINE INJECTION 12/22/2016 [facility]   IR GENERIC HISTORICAL  12/22/2016   IR REMOVAL TUN ACCESS W/ PORT W/O FL MOD SED 12/22/2016 [facility]   VIDEO BRONCHOSCOPY Bilateral 11/26/2016   Procedure: VIDEO BRONCHOSCOPY WITH FLUORO;  Surgeon: Rigoberto Noel, MD;  [facility];  Service: Cardiopulmonary;  Laterality: Bilateral;    OB History   No obstetric history on file.      Home Medications    Prior to Admission medications   Medication Sig Start Date End Date Taking? Authorizing Provider  acyclovir (ZOVIRAX) 400 MG tablet Take 1 tablet (400 mg total) by mouth 2 (two) times daily. Start after completion of Valtrex 10/24/18   Brunetta Genera, MD  amoxicillin (AMOXIL) 500 MG capsule Take 2 capsules (1,000 mg total) by mouth 3 (three) times daily. 12/09/21   Jaynee Eagles, PA-C  azithromycin (ZITHROMAX) 250 MG tablet Day 1: take 2 tablets. Day 2-5: Take 1 tablet daily. 12/09/21   Jaynee Eagles, PA-C  benzonatate (TESSALON) 100 MG capsule Take 1-2 capsules (100-200 mg total) by mouth 3 (three) times daily as needed for cough. 12/09/21   Jaynee Eagles, PA-C  cetirizine (ZYRTEC ALLERGY) 10 MG tablet Take 1 tablet (10 mg total) by mouth daily. 12/09/21   Jaynee Eagles, PA-C  ondansetron (ZOFRAN-ODT) 4 MG disintegrating tablet Take 1 tablet (4 mg total) by mouth every 8  (eight) hours as needed for nausea or vomiting. 01/28/22   Vanessa Kick, MD  promethazine-dextromethorphan (PROMETHAZINE-DM) 6.25-15 MG/5ML syrup Take 5 mLs by mouth at bedtime as needed for cough. 12/09/21   Jaynee Eagles, PA-C  prochlorperazine (COMPAZINE) 10 MG tablet Take 1 tablet (10 mg total) by mouth every 6 (six) hours as needed (Nausea or vomiting). 10/11/18 01/06/19  Brunetta Genera, MD    Family History History reviewed. No pertinent family history.  Social History Social History   Tobacco Use   Smoking status: Former    Packs/day: 0.50    Years: 15.00    Total pack years: 7.50    Types: Cigarettes    Quit date: 03/27/2016    Years since quitting: 6.9   Smokeless tobacco: Never  Vaping Use   Vaping Use: Never used  Substance Use Topics   Alcohol use: Yes    Comment: occasional now   Drug use: Not Currently     Allergies   Patient has no known allergies.   Review of Systems Review of Systems  Constitutional: Negative.   HENT: Negative.    Respiratory: Negative.    Gastrointestinal:  Positive for abdominal pain and constipation. Negative for diarrhea, nausea and vomiting.  Genitourinary:  Negative for dysuria, frequency and vaginal discharge.  Musculoskeletal:  Positive for back pain.  Skin: Negative.   Psychiatric/Behavioral: Negative.       Physical Exam Triage Vital Signs ED Triage Vitals  Enc Vitals Group     BP 03/01/23 0905 134/73     Pulse Rate 03/01/23 0905 (!) 106     Resp 03/01/23 0905 18     Temp 03/01/23 0905 98.8 F (37.1 C)     Temp Source 03/01/23 0905 Oral     SpO2 03/01/23 0905 95 %     Weight --      Height --      Head Circumference --      Peak Flow --      Pain Score 03/01/23 0903 10     Pain Loc --      Pain Edu? --      Excl. in [place]? --    No data found.  Updated Vital Signs BP 134/73 (BP Location: Right Arm)   Pulse (!) 106   Temp 98.8 F (37.1 C) (Oral)   Resp 18   LMP 02/10/2023 (Approximate)   SpO2 95%    Visual Acuity Right Eye Distance:   Left Eye Distance:   Bilateral Distance:    Right Eye Near:   Left Eye Near:    Bilateral Near:     Physical Exam Constitutional:      Appearance: She is well-developed.  Cardiovascular:     Rate and Rhythm: Normal rate and regular rhythm.  Pulmonary:     Effort: Pulmonary effort is  normal.     Breath sounds: Normal breath sounds.  Abdominal:     General: Abdomen is flat. Bowel sounds are decreased.     Palpations: Abdomen is soft.     Tenderness: There is abdominal tenderness in the right lower quadrant, epigastric area and suprapubic area. There is right CVA tenderness. There is no guarding or rebound.  Skin:    General: Skin is warm.  Neurological:     General: No focal deficit present.     Mental Status: She is alert.      UC Treatments / Results  Labs (all labs ordered are listed, but only abnormal results are displayed) Labs Reviewed  POCT URINALYSIS DIPSTICK, ED / UC - Abnormal; Notable for the following components:      Result Value   Hgb urine dipstick MODERATE (*)    Urobilinogen, UA 2.0 (*)    Leukocytes,Ua TRACE (*)    All other components within normal limits  POC URINE PREG, ED    EKG   Radiology DG Abd 1 View  Result Date: 03/01/2023 CLINICAL DATA:  Right flank pain and right lower quadrant pain. EXAM: ABDOMEN - 1 VIEW COMPARISON:  CT 02/17/2021 FINDINGS: Probable tubal ligation clips in the pelvis. Pelvic phleboliths. Nonobstructive bowel gas pattern. Moderate amount of stool in the abdomen and pelvis. No significant calcifications overlying the renal shadows or expected course of the ureters. IMPRESSION: Negative. Electronically Signed   By: Markus Daft M.D.   On: 03/01/2023 09:45    Procedures Procedures (including critical care time)  Medications Ordered in UC Medications  ketorolac (TORADOL) 30 MG/ML injection 30 mg (has no administration in time range)    Initial Impression / Assessment and Plan / UC  Course  I have reviewed the triage vital signs and the nursing notes.  Pertinent labs & imaging results that were available during my care of the patient were reviewed by me and considered in my medical decision making (see chart for details).    Final diagnoses:  Abdominal pain, unspecified abdominal location  Acute right-sided low back pain without sciatica  Benign essential microscopic hematuria     Discharge Instructions      You were seen today for back pain and abdominal pain.  Your xray did not show a kidney stone, but did show moderate stool.  Unclear if this is causing your symptoms.  I recommend you take over the counter mirilax for you constipation.  In the mean time I am treating you for a possible UTI.  I have sent out bactrim twice/day x 7 days.  If you have worsening pain, or develop fever, chills, nausea or vomiting then please go to the ER for further evaluation.      ED Prescriptions     Medication Sig Dispense Auth. Provider   sulfamethoxazole-trimethoprim (BACTRIM DS) 800-160 MG tablet Take 1 tablet by mouth 2 (two) times daily for 7 days. 14 tablet Rondel Oh, MD      PDMP not reviewed this encounter.   Rondel Oh, MD 03/01/23 [phone]

## 2023-03-02 LAB — URINE CULTURE: Culture: <10000 — AB

## 2023-03-29 ENCOUNTER — Emergency Department (HOSPITAL_COMMUNITY)
Admission: EM | Admit: 2023-03-29 | Discharge: 2023-03-29 | Disposition: A | Discharge status: Home / Self Care | Payer: Medicaid Other | Attending: Emergency Medicine | Admitting: Emergency Medicine

## 2023-03-29 ENCOUNTER — Encounter (HOSPITAL_COMMUNITY): Payer: Self-pay

## 2023-03-29 ENCOUNTER — Other Ambulatory Visit: Payer: Self-pay

## 2023-03-29 DIAGNOSIS — M545 Low back pain, unspecified: Secondary | ICD-10-CM | POA: Diagnosis present

## 2023-03-29 DIAGNOSIS — M5441 Lumbago with sciatica, right side: Secondary | ICD-10-CM | POA: Insufficient documentation

## 2023-03-29 MED ORDER — PREDNISONE 20 MG PO TABS: 40.0000 mg | ORAL_TABLET | Freq: Every day | ORAL | 0 refills | Status: DC

## 2023-03-29 MED ORDER — KETOROLAC TROMETHAMINE 15 MG/ML IJ SOLN
15.0000 mg | Freq: Once | INTRAMUSCULAR | Status: AC
  Administered 2023-03-29: 15 mg via INTRAMUSCULAR
  Filled 2023-03-29: qty 1

## 2023-03-29 MED ORDER — LIDOCAINE 5 % EX PTCH: 1.0000 | MEDICATED_PATCH | CUTANEOUS | 0 refills | Status: DC

## 2023-03-29 MED ORDER — KETOROLAC TROMETHAMINE 15 MG/ML IJ SOLN: 15.0000 mg | Freq: Once | INTRAMUSCULAR | Status: DC

## 2023-03-29 NOTE — Discharge Instructions (Signed)
Please use Tylenol or ibuprofen for pain.  You may use 600 mg ibuprofen every 6 hours or 1000 mg of Tylenol every 6 hours.  You may choose to alternate between the 2.  This would be most effective.  Not to exceed 4 g of Tylenol within 24 hours.  Not to exceed 3200 mg ibuprofen 24 hours.  You can use lidocaine patches in addition to the above, I recommend that you follow-up with orthopedics, and begin using the rehab exercises I provided above.

## 2023-03-29 NOTE — ED Provider Notes (Signed)
[facility] Provider Note   CSN: [id number] Arrival date & time: 03/29/23  1207     History  Chief Complaint  Patient presents with   Back Pain   Groin Pain    Heidi [name] is a 37 y.o. female with no history of past medical history presents with 3 weeks of right lower back pain.  Patient denies any lifting injury that she knows of, but reports that pain radiates around to her groin.  She was evaluated at urgent care, with no evidence of urinary tract infection, kidney stone.  No previous history of kidney stones.  She denies any dysuria, hematuria.  She reports worse with walking, she feels like her low back is buckling or going to give out.  She reports that it radiates down her right groin and right lower leg.  No previous history of sciatica.  She denies history of IV drug use, saddle anesthesia, previous cancer, chronic corticosteroid use.   Back Pain Groin Pain       Home Medications Prior to Admission medications   Medication Sig Start Date End Date Taking? Authorizing Provider  lidocaine (LIDODERM) 5 % Place 1 patch onto the skin daily. Remove & Discard patch within 12 hours or as directed by MD 03/29/23  Yes Zenovia Justman H, PA-C  predniSONE (DELTASONE) 20 MG tablet Take 2 tablets (40 mg total) by mouth daily. 03/29/23  Yes Mildred Bollard H, PA-C  prochlorperazine (COMPAZINE) 10 MG tablet Take 1 tablet (10 mg total) by mouth every 6 (six) hours as needed (Nausea or vomiting). 10/11/18 01/06/19  Brunetta Genera, MD      Allergies    Patient has no known allergies.    Review of Systems   Review of Systems  Musculoskeletal:  Positive for back pain.  All other systems reviewed and are negative.   Physical Exam Updated Vital Signs BP (!) 139/90 (BP Location: Left Arm)   Pulse (!) 115   Temp 98.2 F (36.8 C) (Oral)   Resp 18   Ht 5\' 7"  (1.702 m)   Wt 63.5 kg   LMP 02/10/2023 (Approximate)   SpO2 98%    BMI 21.93 kg/m  Physical Exam Vitals and nursing note reviewed.  Constitutional:      General: She is not in acute distress.    Appearance: Normal appearance.  HENT:     Head: Normocephalic and atraumatic.  Eyes:     General:        Right eye: No discharge.        Left eye: No discharge.  Cardiovascular:     Rate and Rhythm: Normal rate and regular rhythm.     Pulses: Normal pulses.  Pulmonary:     Effort: Pulmonary effort is normal. No respiratory distress.  Musculoskeletal:        General: No deformity.     Comments: Patient with tenderness to palpation in the right lumbar paraspinous muscles, no midline spinal tenderness in the cervical, thoracic, or lumbar spine.  She has positive straight leg raise on right.  She has intact strength however 5/5 bilateral lower extremities.  She has mildly antalgic gait.  She has normal sensation throughout bilateral lower extremities.  Skin:    General: Skin is warm and dry.     Capillary Refill: Capillary refill takes less than 2 seconds.  Neurological:     Mental Status: She is alert and oriented to person, place, and time.  Psychiatric:  Mood and Affect: Mood normal.        Behavior: Behavior normal.     ED Results / Procedures / Treatments   Labs (all labs ordered are listed, but only abnormal results are displayed) Labs Reviewed - No data to display  EKG None  Radiology No results found.  Procedures Procedures    Medications Ordered in ED Medications  ketorolac (TORADOL) 15 MG/ML injection 15 mg (has no administration in time range)    ED Course/ Medical Decision Making/ A&P                             Medical Decision Making Risk Prescription drug management.   Patient with back pain.  My emergent differential diagnosis includes slipped disc, compression fracture, spondylolisthesis, less clinical concern for epidural abscess or osteomyelitis based on patient history.  However based on her physical signs  and symptoms and much more suspicious of low back pain, strain, sciatica.  No neurological deficits. Patient is ambulatory. No warning symptoms of back pain including: fecal incontinence, urinary retention or overflow incontinence, night sweats, waking from sleep with back pain, unexplained fevers or weight loss, h/o cancer, IVDU, recent trauma. No concern for cauda equina, epidural abscess, or other serious cause of back pain.  Given this work-up, evaluation, physical exam I do not believe that radiographic imaging is indicated at this time.  Conservative measures such as rest, ice/heat, ibuprofen, Tylenol, she has been experiencing symptoms for 3 weeks with not significant improvement I think that considering her symptoms are consistent with sciatica she would benefit from short burst of steroids, encouraged rehab exercises, close orthopedic follow-up.  Will also discharge with lidocaine patches.  Extensive return precautions given, patient discharged in stable condition at this time.  Final Clinical Impression(s) / ED Diagnoses Final diagnoses:  Acute right-sided low back pain with right-sided sciatica    Rx / DC Orders ED Discharge Orders          Ordered    lidocaine (LIDODERM) 5 %  Every 24 hours        03/29/23 1232    predniSONE (DELTASONE) 20 MG tablet  Daily        03/29/23 1232              Carlus Pavlov [place] H, PA-C 03/29/23 1234    Wyvonnia Dusky, MD 03/29/23 1702

## 2023-03-29 NOTE — ED Triage Notes (Signed)
Intermittent right sided lower back pain that radiates into groin for 3 weeks. Denies N/V/D or difficulty urinating. Denies any other symptoms or injury.

## 2023-04-07 ENCOUNTER — Other Ambulatory Visit (HOSPITAL_BASED_OUTPATIENT_CLINIC_OR_DEPARTMENT_OTHER): Payer: Self-pay

## 2023-04-07 ENCOUNTER — Encounter (HOSPITAL_BASED_OUTPATIENT_CLINIC_OR_DEPARTMENT_OTHER): Payer: Self-pay

## 2023-04-07 ENCOUNTER — Emergency Department (HOSPITAL_BASED_OUTPATIENT_CLINIC_OR_DEPARTMENT_OTHER): Payer: Medicaid Other

## 2023-04-07 ENCOUNTER — Encounter: Payer: Self-pay | Admitting: Hematology

## 2023-04-07 ENCOUNTER — Other Ambulatory Visit: Payer: Self-pay

## 2023-04-07 ENCOUNTER — Emergency Department (HOSPITAL_BASED_OUTPATIENT_CLINIC_OR_DEPARTMENT_OTHER)
Admission: EM | Admit: 2023-04-07 | Discharge: 2023-04-07 | Disposition: A | Discharge status: Home / Self Care | Payer: Medicaid Other | Attending: Emergency Medicine | Admitting: Emergency Medicine

## 2023-04-07 DIAGNOSIS — M545 Low back pain, unspecified: Secondary | ICD-10-CM | POA: Diagnosis present

## 2023-04-07 DIAGNOSIS — J45909 Unspecified asthma, uncomplicated: Secondary | ICD-10-CM | POA: Diagnosis not present

## 2023-04-07 DIAGNOSIS — Z7951 Long term (current) use of inhaled steroids: Secondary | ICD-10-CM | POA: Diagnosis not present

## 2023-04-07 DIAGNOSIS — K689 Other disorders of retroperitoneum: Secondary | ICD-10-CM | POA: Diagnosis not present

## 2023-04-07 DIAGNOSIS — I10 Essential (primary) hypertension: Secondary | ICD-10-CM | POA: Diagnosis not present

## 2023-04-07 DIAGNOSIS — M9983 Other biomechanical lesions of lumbar region: Secondary | ICD-10-CM | POA: Insufficient documentation

## 2023-04-07 DIAGNOSIS — R590 Localized enlarged lymph nodes: Secondary | ICD-10-CM

## 2023-04-07 DIAGNOSIS — M899 Disorder of bone, unspecified: Secondary | ICD-10-CM

## 2023-04-07 LAB — URINALYSIS, ROUTINE W REFLEX MICROSCOPIC
Bacteria, UA: NONE SEEN
Bilirubin Urine: NEGATIVE
Glucose, UA: NEGATIVE mg/dL
Hgb urine dipstick: NEGATIVE
Nitrite: NEGATIVE
Protein, ur: NEGATIVE mg/dL
Specific Gravity, Urine: 1.018 (ref 1.005–1.030)
pH: 6 (ref 5.0–8.0)

## 2023-04-07 LAB — PREGNANCY, URINE: Preg Test, Ur: NEGATIVE

## 2023-04-07 MED ORDER — METHOCARBAMOL 500 MG PO TABS
500.0000 mg | ORAL_TABLET | Freq: Once | ORAL | Status: AC
  Administered 2023-04-07: 500 mg via ORAL
  Filled 2023-04-07: qty 1

## 2023-04-07 MED ORDER — METHYLPREDNISOLONE 4 MG PO TBPK
ORAL_TABLET | ORAL | 0 refills | Status: DC
  Filled 2023-04-07: qty 21, 6d supply, fill #0

## 2023-04-07 MED ORDER — KETOROLAC TROMETHAMINE 30 MG/ML IJ SOLN
15.0000 mg | Freq: Once | INTRAMUSCULAR | Status: AC
  Administered 2023-04-07: 15 mg via INTRAMUSCULAR
  Filled 2023-04-07: qty 1

## 2023-04-07 MED ORDER — HYDROCODONE-ACETAMINOPHEN 5-325 MG PO TABS
1.0000 | ORAL_TABLET | Freq: Four times a day (QID) | ORAL | 0 refills | Status: DC | PRN
  Filled 2023-04-07: qty 10, 3d supply, fill #0

## 2023-04-07 MED ORDER — LIDOCAINE 5 % EX PTCH
1.0000 | MEDICATED_PATCH | Freq: Once | CUTANEOUS | Status: DC
  Administered 2023-04-07: 1 via TRANSDERMAL
  Filled 2023-04-07: qty 1

## 2023-04-07 MED ORDER — HYDROCODONE-ACETAMINOPHEN 5-325 MG PO TABS
1.0000 | ORAL_TABLET | Freq: Once | ORAL | Status: AC
  Administered 2023-04-07: 1 via ORAL
  Filled 2023-04-07: qty 1

## 2023-04-07 NOTE — ED Triage Notes (Addendum)
Patient BIB [facility] from Home.  Endorses Lower Back pain (Bilateral) for approximately 3 Weeks ago. No Known Injury or Trauma. Evaluated for Complaint a few times recently with no discernable findings. No N/V. No Fever. No Dysuria.   VSS with EMS. No EMS Interventions.   NAD Noted during Triage. A&Ox4. GCS 15. Ambulatory.

## 2023-04-07 NOTE — ED Provider Notes (Signed)
[place] [facility] AT [facility] Provider Note   CSN: [id number] Arrival date & time: 04/07/23  1201     History  Chief Complaint  Patient presents with   Back Pain    Heidi [name] is a 37 y.o. female.  Heidi [name] is a 37 y.o. female with a history of Hodgkin's lymphoma, hypertension and asthma, who presents via EMS for evaluation of low back pain.  Patient reports pain has been going on for at least 3 to 4 weeks.  She reports pain is most severe in the middle of her low back but extends out to both sides.  She reports she was seen for this same pain about 10 days ago in the ED despite conservative treatment and has continued to get worse.  Pain intermittently radiates down both of her legs but she denies any numbness or weakness, no loss of bowel or bladder control or saddle anesthesia.  She denies any initial injury or trauma was not doing any strenuous activity or heavy lifting when back pain began.  She denies any associated abdominal pain, nausea or vomiting.  No fevers.  Denies dysuria, hematuria or urinary frequency.  She has not noted any changes in her weight.  She does report a history of lymphoma but reports she is not currently on any treatment for it.  Denies history of IV drug use.  The history is provided by the patient and medical records.  Back Pain Associated symptoms: no abdominal pain, no chest pain, no dysuria, no fever, no numbness and no weakness        Home Medications Prior to Admission medications   Medication Sig Start Date End Date Taking? Authorizing Provider  lidocaine (LIDODERM) 5 % Place 1 patch onto the skin daily. Remove & Discard patch within 12 hours or as directed by MD 03/29/23   Prosperi, Ephriam Knuckles H, PA-C  predniSONE (DELTASONE) 20 MG tablet Take 2 tablets (40 mg total) by mouth daily. 03/29/23   Prosperi, Christian H, PA-C  prochlorperazine (COMPAZINE) 10 MG tablet Take 1 tablet (10 mg total) by mouth every 6  (six) hours as needed (Nausea or vomiting). 10/11/18 01/06/19  [name], MD      Allergies    Patient has no known allergies.    Review of Systems   Review of Systems  Constitutional:  Negative for chills and fever.  HENT: Negative.    Respiratory:  Negative for shortness of breath.   Cardiovascular:  Negative for chest pain.  Gastrointestinal:  Negative for abdominal pain, constipation, diarrhea, nausea and vomiting.  Genitourinary:  Negative for dysuria, flank pain, frequency and hematuria.  Musculoskeletal:  Positive for back pain. Negative for arthralgias, gait problem, joint swelling, myalgias and neck pain.  Skin:  Negative for color change, rash and wound.  Neurological:  Negative for weakness and numbness.    Physical Exam Updated Vital Signs BP (!) 154/102   Pulse (!) 116   Temp 98.1 F (36.7 C) (Oral)   Resp 18   Ht 5\' 7"  (1.702 m)   Wt 63.5 kg   SpO2 95%   BMI 21.93 kg/m  Physical Exam Vitals and nursing note reviewed.  Constitutional:      General: She is not in acute distress.    Appearance: Normal appearance. She is well-developed. She is not diaphoretic.     Comments: Alert, appears uncomfortable but is in no acute distress  HENT:     Head: Atraumatic.  Eyes:  General:        Right eye: No discharge.        Left eye: No discharge.  Cardiovascular:     Pulses:          Radial pulses are 2+ on the right side and 2+ on the left side.       Dorsalis pedis pulses are 2+ on the right side and 2+ on the left side.       Posterior tibial pulses are 2+ on the right side and 2+ on the left side.  Pulmonary:     Effort: Pulmonary effort is normal. No respiratory distress.  Abdominal:     General: Bowel sounds are normal. There is no distension.     Palpations: Abdomen is soft. There is no mass.     Tenderness: There is no abdominal tenderness. There is no guarding.     Comments: Abdomen soft, nondistended, nontender to palpation in all quadrants  without guarding or peritoneal signs, no CVA tenderness bilaterally  Musculoskeletal:     Cervical back: Neck supple.     Comments: Tenderness to palpation over midline lumbar spine and extending out over low back musculature bilaterally, no palpable step-off or deformity.  Pain made worse with range of motion of the lower extremities, but straight leg raise is negative bilaterally  Skin:    General: Skin is warm and dry.     Capillary Refill: Capillary refill takes less than 2 seconds.  Neurological:     Mental Status: She is alert and oriented to person, place, and time.     Comments: Alert, clear speech, following commands. Moving all extremities without difficulty. Bilateral lower extremities with 5/5 strength in proximal and distal muscle groups and with dorsi and plantar flexion. Sensation intact in bilateral lower extremities. 2+ patellar DTRs bilaterally. Difficulty ambulating due to pain  Psychiatric:        Behavior: Behavior normal.     ED Results / Procedures / Treatments   Labs (all labs ordered are listed, but only abnormal results are displayed) Labs Reviewed  URINALYSIS, ROUTINE W REFLEX MICROSCOPIC - Abnormal; Notable for the following components:      Result Value   Ketones, ur TRACE (*)    Leukocytes,Ua TRACE (*)    All other components within normal limits  PREGNANCY, URINE  CBC WITH DIFFERENTIAL/PLATELET  COMPREHENSIVE METABOLIC PANEL  LACTATE DEHYDROGENASE  SEDIMENTATION RATE    EKG None  Radiology CT Lumbar Spine Wo Contrast  Result Date: 04/07/2023 CLINICAL DATA:  Low back pain, cancer suspected history of lymphoma EXAM: CT LUMBAR SPINE WITHOUT CONTRAST TECHNIQUE: Multidetector CT imaging of the lumbar spine was performed without intravenous contrast administration. Multiplanar CT image reconstructions were also generated. RADIATION DOSE REDUCTION: This exam was performed according to the departmental dose-optimization program which includes automated  exposure control, adjustment of the mA and/or kV according to patient size and/or use of iterative reconstruction technique. COMPARISON:  CT abdomen and pelvis 02/17/2021 FINDINGS: Segmentation: 5 lumbar type vertebrae. Alignment: Straightening of the lumbar lordosis. No significant listhesis. Vertebrae: No evidence of acute lumbar spine fracture. There are multiple osseous sclerotic lesions throughout the lumbar spine and partially visualized pelvis and lower thoracic spine. Appearance is similar to recent CT in February. Paraspinal and other soft tissues: There is no retroperitoneal lymphadenopathy, for reference a left periaortic lymph node measuring 1.4 cm short axis (series 4, image 78), and a right iliac chain lymph node measuring 1.6 cm short axis (series 4, image  110). Disc levels: Minimal disc bulging at L2-L3 without significant stenosis. Mild disc bulging at L3-L4 without significant stenosis. Mild asymmetric left disc bulging at L4-L5 with mild left-sided neural foraminal narrowing. Mild disc bulging and ligament flavum thickening at L5-S1 with mild left-sided neural foraminal narrowing. No evidence of high-grade stenosis. IMPRESSION: Retroperitoneal lymphadenopathy new from prior CT in February 2022, concerning for possible recurrent disease given history of lymphoma. Recommend follow-up with oncology. Multiple osseous sclerotic lesions throughout the lumbar spine and partially visualized pelvis and lower thoracic spine, similar to prior CT in February 2022, compatible with previously treated lymphoma. No evidence of acute lumbar spine fracture. Mild degenerative changes of the lumbar spine, with mild left-sided neural foraminal narrowing at L4-L5 and L5-S1. No evidence of high-grade spinal canal or neural foraminal stenosis. Electronically Signed   By: Caprice Renshaw M.D.   On: 04/07/2023 15:16    Procedures Procedures    Medications Ordered in ED Medications  lidocaine (LIDODERM) 5 % 1 patch (1  patch Transdermal Patch Applied 04/07/23 1346)  HYDROcodone-acetaminophen (NORCO/VICODIN) 5-325 MG per tablet 1 tablet (1 tablet Oral Given 04/07/23 1346)  ketorolac (TORADOL) 30 MG/ML injection 15 mg (15 mg Intramuscular Given 04/07/23 1348)  methocarbamol (ROBAXIN) tablet 500 mg (500 mg Oral Given 04/07/23 1346)    ED Course/ Medical Decision Making/ A&P                             Medical Decision Making Amount and/or Complexity of Data Reviewed Labs: ordered. Radiology: ordered.  Risk Prescription drug management.   37 year old female presents with persistent low back pain, seen for the same on 4/2 at this time it was felt to be likely musculoskeletal, without trauma or inciting injury no imaging obtained at that time.  Patient reports worsening pain despite conservative treatment, no focal neurologic deficits.  Given patient's history of cancer will obtain CT lumbar spine to assess for potential metastasis.  Labs ordered and independently reviewed, pregnancy negative, urinalysis with trace leukocytes and white blood cells but no bacteria, patient without urinary symptoms, no concern for infection  CT of the lumbar spine obtained and independently viewed and interpreted, there is new retroperitoneal lymphadenopathy compared to prior CT in February 2022 concerning for potential recurrent lymphoma there are multiple osseous sclerotic lesions throughout the lumbar spine and pelvis and in the lower thoracic spine similar to prior CT compatible with previously treated lymphoma there is some mild degenerative changes at L4-L5 and L5-S1 no high-grade spinal stenosis or neural fully foraminal stenosis noted.  Agree with radiologist interpretation  I consulted with Dr. Candise Che with oncology, patient was been noncompliant with treatments intermittently, currently they are just performing surveillance and she is not on any treatment, discussed CT with concerns for progression of lymphadenopathy, as well  as numerous sclerotic lesions throughout the lumbar spine.  Dr. Candise Che states that patient could be admitted for pain control and further oncologic workup to discuss next treatment options or if the patient does not wish to be admitted they will work to set up close follow-up for her.  Patient's pain is improved with treatment here in the ED with Toradol, Robaxin, hydrocodone and Lidoderm patch.  I discussed CT results with patient, she is tearful, discussed options for admission for pain control and further evaluation versus outpatient follow-up.  She reports that she has to go home to take care of her child but will follow-up with oncology.  Medications prescribed  and discussed strict return precautions with the patient.  She expresses understanding and agreement, discharged home in good condition.        Final Clinical Impression(s) / ED Diagnoses Final diagnoses:  Acute midline low back pain without sciatica  Retroperitoneal lymphadenopathy  Lesion of lumbar spine    Rx / DC Orders ED Discharge Orders          Ordered    methylPREDNISolone (MEDROL DOSEPAK) 4 MG TBPK tablet        04/07/23 1701    HYDROcodone-acetaminophen (NORCO/VICODIN) 5-325 MG tablet  Every 6 hours PRN        04/07/23 1701              Dartha Lodge, PA-C 04/21/23 0813    Elayne Snare K, DO 04/21/23 1603

## 2023-04-07 NOTE — ED Notes (Signed)
Discharge instructions, follow up care, and prescriptions reviewed and explained, pt verbalized understanding and had no further questions on d/c. Pt caox4 and ambulatory on d/c.  

## 2023-04-07 NOTE — Discharge Instructions (Signed)
Your CT scan showed new enlarged lymph nodes in your back as well as some bony lesions in your lumbar spine that could be related to your lymphoma and causing your pain.  It is very important that you follow-up with your oncologist for further evaluation and treatment.  I have sent in a prescription for additional doses of steroids as well as some narcotic pain medication to use as needed for severe pain.  Use this pain medication with caution as it can cause drowsiness, do not drive and be sure to keep it out of reach of children at home.  Return if you develop fevers, persistent numbness, weakness, difficulty controlling your bowels or bladder or other new or concerning symptoms.

## 2023-04-08 ENCOUNTER — Telehealth: Payer: Self-pay | Admitting: Hematology

## 2023-04-14 ENCOUNTER — Telehealth: Payer: Self-pay | Admitting: Hematology

## 2023-04-14 ENCOUNTER — Inpatient Hospital Stay: Payer: Medicaid Other

## 2023-04-14 ENCOUNTER — Inpatient Hospital Stay: Payer: Medicaid Other | Admitting: Hematology

## 2023-04-14 NOTE — Progress Notes (Incomplete)
HEMATOLOGY ONCOLOGY PROGRESS NOTE  Date of service:   04/14/23     Patient Care Team: Pcp, No as PCP - General  Chief complaint: Evaluation and management of Hodgkin's lymphoma  Diagnosis:   Refractory Mixed cellularity Hodgkin's lymphoma IVBE with extensive lymphadenopathy including right axillary, mediastinal and upper retroperitoneal and now biopsy proven pulmonary involvement. She was noted to have significant constitutional symptoms including significant weight loss, fevers chills and some night sweats.  Current Treatment:  Pembrolizumab- 4th line. Patient declined HDT/Auto HSCT or consideration of Car-T cell therapy.  Previous treatment  5 cycles of AVD (without bleomycin due to lung involvement and DLCO of 37%, active smoker) Multiple avoidable treatment delays due to the patient's noncompliance with follow-up for avoidable reasons. She has been counseled repeatedly that this would increase the likelihood of unfavorable outcome.  2nd line therapy with Bendamustine + Brentuximab s/p 7 cycles.  3rd line therapy with ICE s/p 2 cycles   INTERVAL HISTORY:  Heidi [name] is here for continued evaluation and management of her Hodgkins lymphoma. Patient was last seen by me on 07/15/2021 and was doing well overall, but noted acute SOB with CTA done on 6/27.  Today,   -Discussed lab results on 04/14/2023 with patient. CBC showed WBC of ***K, hemoglobin of ***, and platelets of ***K. -    Pembrolizumab  REVIEW OF SYSTEMS:   10 Point review of Systems was done is negative except as noted above.   Past Medical History:  Diagnosis Date   ARDS (adult respiratory distress syndrome)    Asthma    Hodgkin lymphoma    Hypertension     . Past Surgical History:  Procedure Laterality Date   AXILLARY LYMPH NODE BIOPSY Right 03/19/2016   Procedure: AXILLARY LYMPH NODE BIOPSY;  Surgeon: Darnell Level, MD;  Location: [facility];  Service: General;  Laterality: Right;   IR GENERIC  HISTORICAL  12/22/2016   IR FLUORO GUIDE PORT INSERTION LEFT 12/22/2016 [facility]   IR GENERIC HISTORICAL  12/22/2016   IR US GUIDE VASC ACCESS LEFT 12/22/2016 [facility]   IR GENERIC HISTORICAL  12/22/2016   IR CV LINE INJECTION 12/22/2016 [facility]   IR GENERIC HISTORICAL  12/22/2016   IR REMOVAL TUN ACCESS W/ PORT W/O FL MOD SED 12/22/2016 [facility]   VIDEO BRONCHOSCOPY Bilateral 11/26/2016   Procedure: VIDEO BRONCHOSCOPY WITH FLUORO;  Surgeon: Oretha Milch, MD;  Location: [facility];  Service: Cardiopulmonary;  Laterality: Bilateral;    . Social History   Tobacco Use   Smoking status: Every Day    Packs/day: 0.50    Years: 15.00    Additional pack years: 0.00    Total pack years: 7.50    Types: Cigarettes    Last attempt to quit: 03/27/2016    Years since quitting: 7.0   Smokeless tobacco: Never  Vaping Use   Vaping Use: Never used  Substance Use Topics   Alcohol use: Yes    Comment: occasional now   Drug use: Not Currently    ALLERGIES:  has No Known Allergies.  MEDICATIONS:  Current Outpatient Medications  Medication Sig Dispense Refill   HYDROcodone-acetaminophen (NORCO/VICODIN) 5-325 MG tablet Take 1 tablet by mouth every 6 (six) hours as needed. 10 tablet 0   lidocaine (LIDODERM) 5 % Place 1 patch onto the skin daily. Remove & Discard patch within 12 hours or as directed by MD 30 patch 0   methylPREDNISolone (MEDROL DOSEPAK) 4 MG TBPK tablet Take as directed  21 tablet 0   No current facility-administered medications for this visit.   Facility-Administered Medications Ordered in Other Visits  Medication Dose Route Frequency Provider Last Rate Last Admin   sodium chloride flush (NS) 0.9 % injection 10 mL  10 mL Intracatheter PRN [name], MD        PHYSICAL EXAMINATION: ECOG FS:1 - Symptomatic but completely ambulatory  There were no vitals filed for this visit. Wt Readings from Last 3 Encounters:  04/07/23 139 lb 15.9 oz (63.5  kg)  03/29/23 140 lb (63.5 kg)  07/15/21 138 lb 6.4 oz (62.8 kg)   There is no height or weight on file to calculate BMI.     GENERAL:alert, in no acute distress and comfortable SKIN: no acute rashes, no significant lesions EYES: conjunctiva are pink and non-injected, sclera anicteric OROPHARYNX: MMM, no exudates, no oropharyngeal erythema or ulceration NECK: supple, no JVD LYMPH:  no palpable lymphadenopathy in the cervical, axillary or inguinal regions LUNGS: clear to auscultation b/l with normal respiratory effort HEART: regular rate & rhythm ABDOMEN:  normoactive bowel sounds , non tender, not distended. Extremity: no pedal edema PSYCH: alert & oriented x 3 with fluent speech NEURO: no focal motor/sensory deficits   LABORATORY DATA:   I have reviewed the data as listed     Latest Ref Rng & Units 07/15/2021    8:54 AM 06/22/2021    9:32 AM 02/17/2021    1:51 PM  CBC  WBC 4.0 - 10.5 K/uL 2.3  3.9  3.9   Hemoglobin 12.0 - 15.0 g/dL 16.1  09.6  04.5   Hematocrit 36.0 - 46.0 % 37.0  41.0  33.7   Platelets 150 - 400 K/uL 173  201  163    . CBC    Component Value Date/Time   WBC 2.3 (L) 07/15/2021 0854   RBC 3.70 (L) 07/15/2021 0854   HGB 12.3 07/15/2021 0854   HGB 10.6 (L) 11/07/2018 1047   HGB 11.7 07/20/2017 1315   HCT 37.0 07/15/2021 0854   HCT 35.2 07/20/2017 1315   PLT 173 07/15/2021 0854   PLT 473 (H) 11/07/2018 1047   PLT 266 07/20/2017 1315   MCV 100.0 07/15/2021 0854   MCV 93.2 07/20/2017 1315   MCH 33.2 07/15/2021 0854   MCHC 33.2 07/15/2021 0854   RDW 11.9 07/15/2021 0854   RDW 14.5 07/20/2017 1315   LYMPHSABS 0.3 (L) 07/15/2021 0854   LYMPHSABS 0.3 (L) 07/20/2017 1315   MONOABS 0.4 07/15/2021 0854   MONOABS 0.5 07/20/2017 1315   EOSABS 0.1 07/15/2021 0854   EOSABS 0.2 07/20/2017 1315   BASOSABS 0.0 07/15/2021 0854   BASOSABS 0.0 07/20/2017 1315   .    Latest Ref Rng & Units 07/15/2021    8:54 AM 06/22/2021    9:32 AM 02/17/2021    1:51 PM  CMP   Glucose 70 - 99 mg/dL 53  68  84   BUN 6 - 20 mg/dL 6  9  10    Creatinine 0.44 - 1.00 mg/dL 4.09  8.11  9.14   Sodium 135 - 145 mmol/L 144  137  138   Potassium 3.5 - 5.1 mmol/L 4.2  3.9  4.3   Chloride 98 - 111 mmol/L 102  98  102   CO2 22 - 32 mmol/L 31  22  27    Calcium 8.9 - 10.3 mg/dL 9.8  9.3  8.8   Total Protein 6.5 - 8.1 g/dL 7.5  7.1  7.0  Total Bilirubin 0.3 - 1.2 mg/dL 0.5  1.0  0.5   Alkaline Phos 38 - 126 U/L 58  53  59   AST 15 - 41 U/L 55  106  29   ALT 0 - 44 U/L 43  54  24      RADIOGRAPHIC STUDIES: I have personally reviewed the radiological images as listed and agreed with the findings in the report. CT Lumbar Spine Wo Contrast  Result Date: 04/07/2023 CLINICAL DATA:  Low back pain, cancer suspected history of lymphoma EXAM: CT LUMBAR SPINE WITHOUT CONTRAST TECHNIQUE: Multidetector CT imaging of the lumbar spine was performed without intravenous contrast administration. Multiplanar CT image reconstructions were also generated. RADIATION DOSE REDUCTION: This exam was performed according to the departmental dose-optimization program which includes automated exposure control, adjustment of the mA and/or kV according to patient size and/or use of iterative reconstruction technique. COMPARISON:  CT abdomen and pelvis 02/17/2021 FINDINGS: Segmentation: 5 lumbar type vertebrae. Alignment: Straightening of the lumbar lordosis. No significant listhesis. Vertebrae: No evidence of acute lumbar spine fracture. There are multiple osseous sclerotic lesions throughout the lumbar spine and partially visualized pelvis and lower thoracic spine. Appearance is similar to recent CT in February. Paraspinal and other soft tissues: There is no retroperitoneal lymphadenopathy, for reference a left periaortic lymph node measuring 1.4 cm short axis (series 4, image 78), and a right iliac chain lymph node measuring 1.6 cm short axis (series 4, image 110). Disc levels: Minimal disc bulging at L2-L3  without significant stenosis. Mild disc bulging at L3-L4 without significant stenosis. Mild asymmetric left disc bulging at L4-L5 with mild left-sided neural foraminal narrowing. Mild disc bulging and ligament flavum thickening at L5-S1 with mild left-sided neural foraminal narrowing. No evidence of high-grade stenosis. IMPRESSION: Retroperitoneal lymphadenopathy new from prior CT in February 2022, concerning for possible recurrent disease given history of lymphoma. Recommend follow-up with oncology. Multiple osseous sclerotic lesions throughout the lumbar spine and partially visualized pelvis and lower thoracic spine, similar to prior CT in February 2022, compatible with previously treated lymphoma. No evidence of acute lumbar spine fracture. Mild degenerative changes of the lumbar spine, with mild left-sided neural foraminal narrowing at L4-L5 and L5-S1. No evidence of high-grade spinal canal or neural foraminal stenosis. Electronically Signed   By: Caprice Renshaw M.D.   On: 04/07/2023 15:16    ASSESSMENT & PLAN:   37 y.o. African-American female with  #1 Refractory/Progressive Mixed cellularity Hodgkin's lymphoma IV BE with extensive lymphadenopathy including right axillary, mediastinal and upper retroperitoneal and now biopsy proven pulmonary involvement. She was noted to have significant constitutional symptoms including significant weight loss, fevers chills and some night sweats. HIV negative Hepatitis C and hepatitis B serologies negative. Echo with normal ejection fraction. Patient was treated with 5 cycles of AVD (Bleomycin held due to poor DLCO 37% and ongoing smoking). Multiple avoidable treatment delays due to the patient's noncompliance with follow-up for avoidable reasons. She has been counseled repeatedly that this would increase the likelihood of unfavorable outcome.   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 S/p 6 cycles of 2nd line treatment with  Bendamustine/Brentuximab And 1 cycle of Bretuximab alone   -PET/CT scan results from 04/14/2017 were discussed in details. She appears to have some persistent disease in her right lung at Deauville 5. It is difficult to say if this is recurrent or persistent disease since the patient failed to follow-up on multiple scheduled PET/CT scans in the early part and  prior to her second line treatment.   Patient was lost to followup for >1 yr   09/26/18 PET/CT revealed Imaging findings compatible with recurrence of disease. 2. Multiple new large areas of hypermetabolic nodularity and airspace consolidation within both lungs which is presumed to represent pulmonary involvement by lymphoma. Deauville criteria 5. 3. New hypermetabolic left supraclavicular, left retroperitoneal, and bilateral pelvic lymph nodes. Deauville criteria 5. 4. Multifocal hypermetabolic osseous lesions.  Deauville criteria 5. 5. Small volume of ascites, new.   12/06/18 PET/CT revealed Generally improved appearance with previously mostly Deauville 5 activity now mostly Deauville 4 activity. 2. Layering of much of the airspace opacity in the lungs, although considerable right perihilar airspace opacity remains. The remaining pulmonary opacities are assess this Deauville 5 on the right and over L4 on the left, and were previously Deauville 5 bilaterally. 3. Mildly reduced size and moderately reduced activity in the abdominopelvic lymph nodes which are now dove L4. 4. The previously seen left supraclavicular lymph node seems to have completely resolved (Deauville 0). 5. The skeletal lesions remain at Deauville 4, although in absolute terms have decreased in SUV compared to previous. 6. No new regions of malignant involvement compared to prior exam. 7. Other imaging findings of potential clinical significance: Low-density blood pool suggests anemia. Pectus excavatum. Volume loss in the right hemithorax.  06/13/19 PET/CT revealed "No abnormal  hypermetabolism (Deauville 1). 2. Mixed lytic and sclerotic osseous lesions appear more prominent than on 12/06/2018 but do not have associated hypermetabolism, indicative of interval healing." 01/31/2020 CT ABDOMEN PELVIS W CONTRAST (Accession [id number]) and CT CHEST W CONTRAST (Accession [id number]) revealed "1. Small pre pericardial lymph node, not present on the prior study. 2. Stable volume loss in the right chest with shift of mediastinal structures into the right chest. 3. No change in the appearance of multifocal bony sclerosis, without signs of FDG uptake on the previous exam, attention on follow-up."   #2 s/p hypoxic respiratory failure with dense right lung consolidation and left upper lobe consolidation with SVC syndrome. Patient has completed palliative radiation to the right lung mass causing SVC compression.  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   #4 Non compliance with clinic and treatment followup. Missed 2nd dose of bendamustine with C2. Has missed multiple appointment for her PET/CT and missed appointment at [facility] for consideration of Transplant.  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  #8 Abnormal LFTs ? Related to Pembrolizumab - stable today  PLAN: -Discussed pt labwork today, 07/14/2021; CBC, cmp wnl, sed rate wnl @ 2 -No overt lab or clinical evidence of CHL progression at this  time. -patient continues to be off pembrolizumab per her choice -Will see back in 4 months. -patient is aware of symptoms to monitor for and call us if any new concerns.  FOLLOW-UP: ***  The total time spent in the appointment was *** minutes* .  All of  the patient's questions were answered with apparent satisfaction. The patient knows to call the clinic with any problems, questions or concerns.   Wyvonnia Lora MD MS AAHIVMS [facility] Hematology/Oncology Physician [facility]  .*Total Encounter Time as defined by the Centers for Medicare and Medicaid Services includes, in addition to the face-to-face time of a patient visit (documented in the note above) non-face-to-face time: obtaining and reviewing outside history, ordering and reviewing medications, tests or procedures, care coordination (communications with other health care professionals or caregivers) and documentation in the medical record.    I,Heidi [name],acting as a Neurosurgeon for Wyvonnia Lora, MD.,have documented all relevant documentation on the behalf of Wyvonnia Lora, MD,as directed by  Wyvonnia Lora, MD while in the presence of Wyvonnia Lora, MD.  ***

## 2023-04-18 ENCOUNTER — Telehealth: Payer: Self-pay | Admitting: Hematology

## 2023-05-10 ENCOUNTER — Other Ambulatory Visit: Payer: Medicaid Other

## 2023-05-10 ENCOUNTER — Ambulatory Visit: Payer: Medicaid Other | Admitting: Hematology

## 2023-05-26 ENCOUNTER — Other Ambulatory Visit: Payer: Self-pay

## 2023-05-26 DIAGNOSIS — C8198 Hodgkin lymphoma, unspecified, lymph nodes of multiple sites: Secondary | ICD-10-CM

## 2023-05-27 ENCOUNTER — Inpatient Hospital Stay: Payer: Medicaid Other | Attending: Hematology | Admitting: Hematology

## 2023-05-27 ENCOUNTER — Inpatient Hospital Stay: Payer: Medicaid Other

## 2023-05-27 VITALS — BP 135/80 | Temp 97.7°F | Resp 17 | Ht 67.0 in | Wt 134.6 lb

## 2023-05-27 DIAGNOSIS — Z91199 Patient's noncompliance with other medical treatment and regimen due to unspecified reason: Secondary | ICD-10-CM | POA: Insufficient documentation

## 2023-05-27 DIAGNOSIS — R591 Generalized enlarged lymph nodes: Secondary | ICD-10-CM | POA: Diagnosis present

## 2023-05-27 DIAGNOSIS — F410 Panic disorder [episodic paroxysmal anxiety] without agoraphobia: Secondary | ICD-10-CM | POA: Insufficient documentation

## 2023-05-27 DIAGNOSIS — R102 Pelvic and perineal pain: Secondary | ICD-10-CM | POA: Insufficient documentation

## 2023-05-27 DIAGNOSIS — Z79899 Other long term (current) drug therapy: Secondary | ICD-10-CM | POA: Insufficient documentation

## 2023-05-27 DIAGNOSIS — Q676 Pectus excavatum: Secondary | ICD-10-CM | POA: Diagnosis not present

## 2023-05-27 DIAGNOSIS — Z87891 Personal history of nicotine dependence: Secondary | ICD-10-CM | POA: Insufficient documentation

## 2023-05-27 DIAGNOSIS — R634 Abnormal weight loss: Secondary | ICD-10-CM | POA: Diagnosis not present

## 2023-05-27 DIAGNOSIS — R6883 Chills (without fever): Secondary | ICD-10-CM | POA: Insufficient documentation

## 2023-05-27 DIAGNOSIS — R61 Generalized hyperhidrosis: Secondary | ICD-10-CM | POA: Insufficient documentation

## 2023-05-27 DIAGNOSIS — R200 Anesthesia of skin: Secondary | ICD-10-CM | POA: Insufficient documentation

## 2023-05-27 DIAGNOSIS — Z923 Personal history of irradiation: Secondary | ICD-10-CM | POA: Diagnosis not present

## 2023-05-27 DIAGNOSIS — K760 Fatty (change of) liver, not elsewhere classified: Secondary | ICD-10-CM | POA: Diagnosis not present

## 2023-05-27 DIAGNOSIS — C8198 Hodgkin lymphoma, unspecified, lymph nodes of multiple sites: Secondary | ICD-10-CM | POA: Diagnosis not present

## 2023-05-27 DIAGNOSIS — C8120 Mixed cellularity classical Hodgkin lymphoma, unspecified site: Secondary | ICD-10-CM | POA: Insufficient documentation

## 2023-05-27 DIAGNOSIS — R188 Other ascites: Secondary | ICD-10-CM | POA: Insufficient documentation

## 2023-05-27 LAB — CMP (CANCER CENTER ONLY)
ALT: 9 U/L (ref 0–44)
AST: 14 U/L — ABNORMAL LOW (ref 15–41)
Albumin: 3.7 g/dL (ref 3.5–5.0)
Alkaline Phosphatase: 87 U/L (ref 38–126)
Anion gap: 8 (ref 5–15)
BUN: 8 mg/dL (ref 6–20)
CO2: 30 mmol/L (ref 22–32)
Calcium: 9.1 mg/dL (ref 8.9–10.3)
Chloride: 96 mmol/L — ABNORMAL LOW (ref 98–111)
Creatinine: 0.8 mg/dL (ref 0.44–1.00)
GFR, Estimated: >60 mL/min (ref >60)
Glucose, Bld: 117 mg/dL — ABNORMAL HIGH (ref 70–99)
Potassium: 3.6 mmol/L (ref 3.5–5.1)
Sodium: 134 mmol/L — ABNORMAL LOW (ref 135–145)
Total Bilirubin: 0.5 mg/dL (ref 0.3–1.2)
Total Protein: 7.1 g/dL (ref 6.5–8.1)

## 2023-05-27 LAB — CBC WITH DIFFERENTIAL (CANCER CENTER ONLY)
Abs Immature Granulocytes: 0.02 10*3/uL (ref 0.00–0.07)
Basophils Absolute: 0 10*3/uL (ref 0.0–0.1)
Basophils Relative: 0 %
Eosinophils Absolute: 0 10*3/uL (ref 0.0–0.5)
Eosinophils Relative: 0 %
HCT: 32.8 % — ABNORMAL LOW (ref 36.0–46.0)
Hemoglobin: 10.7 g/dL — ABNORMAL LOW (ref 12.0–15.0)
Immature Granulocytes: 0 %
Lymphocytes Relative: 3 %
Lymphs Abs: 0.2 10*3/uL — ABNORMAL LOW (ref 0.7–4.0)
MCH: 31.7 pg (ref 26.0–34.0)
MCHC: 32.6 g/dL (ref 30.0–36.0)
MCV: 97 fL (ref 80.0–100.0)
Monocytes Absolute: 0.7 10*3/uL (ref 0.1–1.0)
Monocytes Relative: 10 %
Neutro Abs: 5.8 10*3/uL (ref 1.7–7.7)
Neutrophils Relative %: 87 %
Platelet Count: 380 10*3/uL (ref 150–400)
RBC: 3.38 MIL/uL — ABNORMAL LOW (ref 3.87–5.11)
RDW: 12.7 % (ref 11.5–15.5)
WBC Count: 6.8 10*3/uL (ref 4.0–10.5)
nRBC: 0 % (ref 0.0–0.2)

## 2023-05-27 LAB — SEDIMENTATION RATE: Sed Rate: 58 mm/hr — ABNORMAL HIGH (ref 0–22)

## 2023-05-27 MED ORDER — GABAPENTIN 300 MG PO CAPS: 300.0000 mg | ORAL_CAPSULE | Freq: Every day | ORAL | 1 refills | Status: DC

## 2023-05-27 MED ORDER — HYDROCODONE-ACETAMINOPHEN 5-325 MG PO TABS: 1.0000 | ORAL_TABLET | Freq: Four times a day (QID) | ORAL | 0 refills | Status: DC | PRN

## 2023-05-27 NOTE — Progress Notes (Signed)
HEMATOLOGY ONCOLOGY PROGRESS NOTE  Date of service:   05/27/23   Patient Care Team: Patient, No Pcp Per (Inactive) as PCP - General (General Practice)  Chief complaint: Follow-up for Hodgkin's lymphoma  Diagnosis:   Refractory Mixed cellularity Hodgkin's lymphoma IVBE with extensive lymphadenopathy including right axillary, mediastinal and upper retroperitoneal and now biopsy proven pulmonary involvement. She was noted to have significant constitutional symptoms including significant weight loss, fevers chills and some night sweats.  Current Treatment:  Pembrolizumab- 4th line. Patient declined HDT/Auto HSCT or consideration of Car-T cell therapy.  Previous treatment  5 cycles of AVD (without bleomycin due to lung involvement and DLCO of 37%, active smoker) Multiple avoidable treatment delays due to the patient's noncompliance with follow-up for avoidable reasons. She has been counseled repeatedly that this would increase the likelihood of unfavorable outcome.  2nd line therapy with Bendamustine + Brentuximab s/p 7 cycles.  3rd line therapy with ICE s/p 2 cycles   INTERVAL HISTORY:  [name] is a 37 y.o. female here for follow-up for her Hodgkins lymphoma. Patient was last seen by me on 07/15/2021 and reported an episode of acute SOB with 6/27 CTA which she attributed to having a panic attack. Patient was otherwise doing well overall.  Today, she reports that she has been feeling well overall. Patient does complain of intermittent lower back pain beginning a few weeks ago. The pain radiates to her bilateral legs and feet, and is especially present in her left LE. She does also endorse numbness/tingling in her lower extremities. Patient has not been seen by orthopedics.   She does sometimes experience pain in her inner pelvic area with movement. She denies any leg swelling. She does report a groin lymph node which has been present since early April. The lump in her groin is  about the size of a hazelnut. She does also complain of lymph nodes in her neck.  Patient reports losing 10-15 pound weight loss rapidly. She denies any swallowing issues. Patient has regular normal menstrual cycles. No other blood loss. Patient Last had her port flushed late last year. She denies any fever, chills, night sweats, change in breathing, or change in bowel habits.  She did have fall after slipping during October 2023. Patient landed on her front side. She denies any injuries, but did endorse generalized soreness afterwards.  She was previously taking a 5 day taper of Prednisone.   REVIEW OF SYSTEMS:   10 Point review of Systems was done is negative except as noted above.   Past Medical History:  Diagnosis Date   ARDS (adult respiratory distress syndrome) (HCC)    Asthma    Hodgkin lymphoma (HCC)    Hypertension     . Past Surgical History:  Procedure Laterality Date   AXILLARY LYMPH NODE BIOPSY Right 03/19/2016   Procedure: AXILLARY LYMPH NODE BIOPSY;  Surgeon: Darnell Level, MD;  Location: [facility];  Service: General;  Laterality: Right;   IR GENERIC HISTORICAL  12/22/2016   IR FLUORO GUIDE PORT INSERTION LEFT 12/22/2016 [facility]   IR GENERIC HISTORICAL  12/22/2016   IR US GUIDE VASC ACCESS LEFT 12/22/2016 [facility]   IR GENERIC HISTORICAL  12/22/2016   IR CV LINE INJECTION 12/22/2016 [facility]   IR GENERIC HISTORICAL  12/22/2016   IR REMOVAL TUN ACCESS W/ PORT W/O FL MOD SED 12/22/2016 [facility]   VIDEO BRONCHOSCOPY Bilateral 11/26/2016   Procedure: VIDEO BRONCHOSCOPY WITH FLUORO;  Surgeon: Oretha Milch, MD;  Location:  [facility];  Service: Cardiopulmonary;  Laterality: Bilateral;    . Social History   Tobacco Use   Smoking status: Former    Packs/day: 0.50    Years: 15.00    Pack years: 7.50    Types: Cigarettes    Quit date: 03/27/2016    Years since quitting: 5.3   Smokeless tobacco: Never  Vaping Use   Vaping Use: Never used  Substance  Use Topics   Alcohol use: Yes    Comment: occasional now    ALLERGIES:  has No Known Allergies.  MEDICATIONS:  Current Outpatient Medications  Medication Sig Dispense Refill   acyclovir (ZOVIRAX) 400 MG tablet Take 1 tablet (400 mg total) by mouth 2 (two) times daily. Start after completion of Valtrex (Patient not taking: Reported on 01/11/2020) 60 tablet 6   amitriptyline (ELAVIL) 10 MG tablet Take 1 tablet (10 mg total) by mouth at bedtime. (Patient not taking: Reported on 01/11/2020) 20 tablet 0   gabapentin (NEURONTIN) 300 MG capsule Take 1 capsule (300 mg total) by mouth 2 (two) times daily. (Patient not taking: Reported on 01/11/2020) 60 capsule 0   oxyCODONE-acetaminophen (PERCOCET/ROXICET) 5-325 MG tablet Take 1-2 tablets by mouth every 6 (six) hours as needed for severe pain. (Patient not taking: Reported on 01/11/2020) 50 tablet 0   No current facility-administered medications for this visit.   Facility-Administered Medications Ordered in Other Visits  Medication Dose Route Frequency Provider Last Rate Last Admin   sodium chloride flush (NS) 0.9 % injection 10 mL  10 mL Intracatheter PRN [name], MD        PHYSICAL EXAMINATION: ECOG FS:1 - Symptomatic but completely ambulatory  There were no vitals filed for this visit. Wt Readings from Last 3 Encounters:  06/22/21 140 lb (63.5 kg)  02/18/21 146 lb 14.4 oz (66.6 kg)  01/07/21 140 lb 6.4 oz (63.7 kg)   There is no height or weight on file to calculate BMI.     GENERAL:alert, in no acute distress and comfortable SKIN: no acute rashes, no significant lesions EYES: conjunctiva are pink and non-injected, sclera anicteric OROPHARYNX: MMM, no exudates, no oropharyngeal erythema or ulceration NECK: supple, no JVD LYMPH:  no palpable lymphadenopathy in the cervical, axillary or inguinal regions LUNGS: clear to auscultation b/l with normal respiratory effort HEART: regular rate & rhythm ABDOMEN:  normoactive bowel  sounds , non tender, not distended. Extremity: no pedal edema PSYCH: alert & oriented x 3 with fluent speech NEURO: no focal motor/sensory deficits   LABORATORY DATA:   I have reviewed the data as listed  CBC Latest Ref Rng & Units 06/22/2021 02/17/2021 01/07/2021  WBC 4.0 - 10.5 K/uL 3.9(L) 3.9(L) 2.4(L)  Hemoglobin 12.0 - 15.0 g/dL 16.1 11.2(L) 13.1  Hematocrit 36.0 - 46.0 % 41.0 33.7(L) 39.6  Platelets 150 - 400 K/uL 201 163 183   . CBC    Component Value Date/Time   WBC 3.9 (L) 06/22/2021 0932   RBC 4.11 06/22/2021 0932   HGB 13.8 06/22/2021 0932   HGB 10.6 (L) 11/07/2018 1047   HGB 11.7 07/20/2017 1315   HCT 41.0 06/22/2021 0932   HCT 35.2 07/20/2017 1315   PLT 201 06/22/2021 0932   PLT 473 (H) 11/07/2018 1047   PLT 266 07/20/2017 1315   MCV 99.8 06/22/2021 0932   MCV 93.2 07/20/2017 1315   MCH 33.6 06/22/2021 0932   MCHC 33.7 06/22/2021 0932   RDW 11.9 06/22/2021 0932   RDW 14.5 07/20/2017 1315  LYMPHSABS 0.5 (L) 02/17/2021 1351   LYMPHSABS 0.3 (L) 07/20/2017 1315   MONOABS 0.6 02/17/2021 1351   MONOABS 0.5 07/20/2017 1315   EOSABS 0.1 02/17/2021 1351   EOSABS 0.2 07/20/2017 1315   BASOSABS 0.0 02/17/2021 1351   BASOSABS 0.0 07/20/2017 1315   . CMP Latest Ref Rng & Units 06/22/2021 02/17/2021 01/07/2021  Glucose 70 - 99 mg/dL 16(X) 84 95  BUN 6 - 20 mg/dL 9 10 6   Creatinine 0.44 - 1.00 mg/dL 0.96 0.45 4.09  Sodium 135 - 145 mmol/L 137 138 139  Potassium 3.5 - 5.1 mmol/L 3.9 4.3 4.0  Chloride 98 - 111 mmol/L 98 102 101  CO2 22 - 32 mmol/L 22 27 27   Calcium 8.9 - 10.3 mg/dL 9.3 8.1(X) 9.6  Total Protein 6.5 - 8.1 g/dL 7.1 7.0 7.9  Total Bilirubin 0.3 - 1.2 mg/dL 1.0 0.5 0.5  Alkaline Phos 38 - 126 U/L 53 59 80  AST 15 - 41 U/L 106(H) 29 40  ALT 0 - 44 U/L 54(H) 24 29     RADIOGRAPHIC STUDIES: I have personally reviewed the radiological images as listed and agreed with the findings in the report. CT Angio Chest PE W/Cm &/Or Wo Cm  Result Date:  06/22/2021 CLINICAL DATA:  Shortness of breath.  History of Hodgkin's lymphoma EXAM: CT ANGIOGRAPHY CHEST WITH CONTRAST TECHNIQUE: Multidetector CT imaging of the chest was performed using the standard protocol during bolus administration of intravenous contrast. Multiplanar CT image reconstructions and MIPs were obtained to evaluate the vascular anatomy. CONTRAST:  50mL OMNIPAQUE IOHEXOL 350 MG/ML SOLN COMPARISON:  Chest CT February 17, 2021; chest radiograph June 22, 2021 FINDINGS: Cardiovascular: No demonstrable pulmonary embolus. Note that there is fibrosis in the perihilar region on the right causing mild narrowing of several pulmonary arterial vessels on the right, stable from prior study. There is no thoracic aortic aneurysm or dissection. Visualized great vessels appear unremarkable. There is evidence of a degree of left ventricular hypertrophy. No pericardial effusion or pericardial thickening. Main pulmonary outflow tract measures 3.1 cm, mildly prominent. Port-A-Cath tip in superior vena cava. Mediastinum/Nodes: 5 mm nodular opacity in the left lobe of the thyroid is stable; no additional imaging surveillance of this small nodular opacity warranted per consensus guidelines. No evident thoracic adenopathy. No esophageal lesions appreciable. Lungs/Pleura: There is stable scarring in the right upper lobe with localized reticular thickening. 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 There is no appreciable new opacity on the right. On the left, there are areas of relative mosaic attenuation without well-defined airspace opacity. No pleural effusions evident on either side. No pneumothorax. Trachea and major bronchial structures appear patent. Upper Abdomen: There is hepatic steatosis. Visualized upper abdominal structures otherwise appear unremarkable.  Musculoskeletal: Stable sclerotic foci in the thoracic spine. No new bone lesions evident. No appreciable chest wall lesions. Review of the MIP images confirms the above findings. IMPRESSION: 1. No appreciable pulmonary embolus. No thoracic aortic aneurysm or dissection. There is left ventricular hypertrophy. 2. Prominence of the main pulmonary outflow tract may indicate a degree of pulmonary arterial hypertension. 3. Fibrosis on the right with volume loss and post radiation therapy changes, essentially stable from prior study. 4. Areas of mosaic attenuation on the left may represent a degree of underlying small airways obstructive disease. No edema or consolidation appreciable. No pleural effusions. 5.  No evident adenopathy by size criteria. 6.  Bony changes indicative of treated lymphoma, stable. 7.  Hepatic steatosis. Electronically Signed   By: Bretta Bang III M.D.   On: 06/22/2021 12:31   DG Chest Port 1 View  Result Date: 06/22/2021 CLINICAL DATA:  Chest pain EXAM: PORTABLE CHEST 1 VIEW COMPARISON:  02/17/2021 CT FINDINGS: Cardiac shadow is enlarged but stable. Left chest wall port is again noted. Central changes of radiation fibrosis are again seen similar to that noted on prior CT with volume loss in the right lung. Left lung is hyperinflated. No focal infiltrate or sizable effusion is seen. No bony abnormality is noted. 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 No new focal abnormality is noted. Electronically Signed   By: Alcide Clever M.D.   On: 06/22/2021 09:49    ASSESSMENT & PLAN:   37 y.o. African-American female with  #1 Refractory/Progressive Mixed cellularity Hodgkin's lymphoma IV BE with extensive lymphadenopathy including right axillary, mediastinal and upper retroperitoneal and now biopsy proven pulmonary involvement. She was noted to have significant constitutional symptoms including significant weight loss, fevers chills and some night sweats. HIV  negative Hepatitis C and hepatitis B serologies negative. Echo with normal ejection fraction. Patient was treated with 5 cycles of AVD (Bleomycin held due to poor DLCO 37% and ongoing smoking). Multiple avoidable treatment delays due to the patient's noncompliance with follow-up for avoidable reasons. She has been counseled repeatedly that this would increase the likelihood of unfavorable outcome.   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 S/p 6 cycles of 2nd line treatment with Bendamustine/Brentuximab And 1 cycle of Bretuximab alone   -PET/CT scan results from 04/14/2017 were discussed in details. She appears to have some persistent disease in her right lung at Deauville 5. It is difficult to say if this is recurrent or persistent disease since the patient failed to follow-up on multiple scheduled PET/CT scans in the early part and prior to her second line treatment.   Patient was lost to followup for >1 yr   09/26/18 PET/CT revealed Imaging findings compatible with recurrence of disease. 2. Multiple new large areas of hypermetabolic nodularity and airspace consolidation within both lungs which is presumed to represent pulmonary involvement by lymphoma. Deauville criteria 5. 3. New hypermetabolic left supraclavicular, left retroperitoneal, and bilateral pelvic lymph nodes. Deauville criteria 5. 4. Multifocal hypermetabolic osseous lesions.  Deauville criteria 5. 5. Small volume of ascites, new.   12/06/18 PET/CT revealed Generally improved appearance with previously mostly Deauville 5 activity now mostly Deauville 4 activity. 2. Layering of much of the airspace opacity in the lungs, although considerable right perihilar airspace opacity remains. The remaining pulmonary opacities are assess this Deauville 5 on the right and over L4 on the left, and were previously Deauville 5 bilaterally. 3. Mildly reduced size and moderately reduced activity in the abdominopelvic  lymph nodes which are now dove L4. 4. The previously seen left supraclavicular lymph node seems to have completely resolved (Deauville 0). 5. The skeletal lesions remain at Deauville 4, although in absolute terms have decreased in SUV compared to previous. 6. No new regions of malignant involvement compared to prior exam. 7. Other imaging findings of potential clinical significance: Low-density blood pool suggests anemia. Pectus excavatum. Volume loss in the right hemithorax.  06/13/19 PET/CT revealed "No abnormal hypermetabolism (Deauville 1). 2. Mixed lytic and sclerotic osseous lesions appear more prominent than on 12/06/2018 but do not have associated hypermetabolism, indicative of interval  healing." 01/31/2020 CT ABDOMEN PELVIS W CONTRAST (Accession [id number]) and CT CHEST W CONTRAST (Accession [id number]) revealed "1. Small pre pericardial lymph node, not present on the prior study. 2. Stable volume loss in the right chest with shift of mediastinal structures into the right chest. 3. No change in the appearance of multifocal bony sclerosis, without signs of FDG uptake on the previous exam, attention on follow-up."   #2 s/p hypoxic respiratory failure with dense right lung consolidation and left upper lobe consolidation with SVC syndrome. Patient has completed palliative radiation to the right lung mass causing SVC compression.  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   #4 Non compliance with clinic and treatment followup. Missed 2nd dose of bendamustine with C2. Has missed multiple appointment for her PET/CT and missed appointment at [facility] for consideration of Transplant.  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  #8 Abnormal LFTs ? Related to Pembrolizumab - stable today  PLAN:  -Discussed lab results on 05/27/2023 in detail with patient. CBC showed WBC of 6.8K, hemoglobin of 10.7, and platelets of 380K. -patient is newly anemic, hemoglobin previously 12.3 on 07/15/2021 -WBC and platelets normal -CMP stable -CT lumbar spine on 4/11 showed Retroperitoneal lymph nodes, which is a new finding compared to prior scan in February 2022, which suggests recurrence of Hodgkins lymphoma -multiple lesions found in lumbar spine, unsure if this is active disease at this time -degeneratie disc may be pinching nerves in back -discussed findings of degenerative disc disease with mild left sided narrowing -will order Gabapentin to improve nerve pain. Patient shall take this nightly -discussed goal to determine if lower back pain is related to degenerative disc disease, arthritis pinching the nerve, or Hodgkin's lymphoma -advised patient to connect with orthopedics to consider physical therapy to manage pain -informed patient that pain related to a disc issue is generally improved by 90% with physical therapy. If pain does not improve with physical therapy, discussed option of receiving an injection of steroids around the area that is pinched to manage pain -will order PET to further evaluate lymph nodes  FOLLOW-UP: PET/CT in 1 week PH[facility] visit with Dr Candise Che in 2 weeks  The total time spent in the appointment was *** minutes* .  All of the patient's questions were answered with apparent satisfaction. The patient knows to call the clinic with any problems, questions or concerns.   Wyvonnia Lora MD MS AAHIVMS [facility]  [facility] Hematology/Oncology Physician [facility]  .*Total Encounter Time as defined by the Centers for Medicare and Medicaid Services includes, in addition to the face-to-face time of a patient visit (documented in the note above) non-face-to-face time: obtaining and reviewing outside history, ordering and reviewing medications, tests or procedures, care coordination (communications with other health care professionals or caregivers) and documentation in the medical record.    I,Mitra Faeizi,acting as a Neurosurgeon for Wyvonnia Lora, MD.,have documented all  relevant documentation on the behalf of Wyvonnia Lora, MD,as directed by  Wyvonnia Lora, MD while in the presence of Wyvonnia Lora, MD.  ***

## 2023-05-30 ENCOUNTER — Telehealth: Payer: Self-pay

## 2023-05-30 NOTE — Telephone Encounter (Signed)
Notified Patient of prior authorization approval for Hydrocodone 5/325 mg Tablets.Medication is approved for a quantity of 30 and is approved through 11/26/2023.  No other needs or concerns voiced at this time.

## 2023-06-02 ENCOUNTER — Encounter: Payer: Self-pay | Admitting: Hematology

## 2023-06-15 ENCOUNTER — Ambulatory Visit (HOSPITAL_COMMUNITY)
Admission: RE | Admit: 2023-06-15 | Discharge: 2023-06-15 | Disposition: A | Discharge status: Home / Self Care | Payer: Medicaid Other | Source: Ambulatory Visit | Attending: Hematology | Admitting: Hematology

## 2023-06-15 DIAGNOSIS — C8198 Hodgkin lymphoma, unspecified, lymph nodes of multiple sites: Secondary | ICD-10-CM | POA: Insufficient documentation

## 2023-06-15 LAB — GLUCOSE, CAPILLARY: Glucose-Capillary: 69 mg/dL — ABNORMAL LOW (ref 70–99)

## 2023-06-15 MED ORDER — FLUDEOXYGLUCOSE F - 18 (FDG) INJECTION
6.7000 | Freq: Once | INTRAVENOUS | Status: AC
  Administered 2023-06-15: 6.8 via INTRAVENOUS

## 2023-06-16 ENCOUNTER — Telehealth: Payer: Self-pay | Admitting: Hematology

## 2023-06-24 ENCOUNTER — Inpatient Hospital Stay: Payer: Medicaid Other | Attending: Hematology | Admitting: Hematology

## 2023-06-24 DIAGNOSIS — Z7189 Other specified counseling: Secondary | ICD-10-CM

## 2023-06-24 DIAGNOSIS — D649 Anemia, unspecified: Secondary | ICD-10-CM | POA: Diagnosis not present

## 2023-06-24 DIAGNOSIS — C8198 Hodgkin lymphoma, unspecified, lymph nodes of multiple sites: Secondary | ICD-10-CM

## 2023-06-24 DIAGNOSIS — K760 Fatty (change of) liver, not elsewhere classified: Secondary | ICD-10-CM | POA: Insufficient documentation

## 2023-06-24 DIAGNOSIS — C8128 Mixed cellularity classical Hodgkin lymphoma, lymph nodes of multiple sites: Secondary | ICD-10-CM | POA: Diagnosis not present

## 2023-06-24 DIAGNOSIS — C7951 Secondary malignant neoplasm of bone: Secondary | ICD-10-CM | POA: Insufficient documentation

## 2023-06-24 DIAGNOSIS — Q676 Pectus excavatum: Secondary | ICD-10-CM | POA: Diagnosis not present

## 2023-06-24 DIAGNOSIS — C8120 Mixed cellularity classical Hodgkin lymphoma, unspecified site: Secondary | ICD-10-CM | POA: Insufficient documentation

## 2023-06-24 DIAGNOSIS — R61 Generalized hyperhidrosis: Secondary | ICD-10-CM | POA: Diagnosis not present

## 2023-06-24 DIAGNOSIS — Z87891 Personal history of nicotine dependence: Secondary | ICD-10-CM | POA: Diagnosis not present

## 2023-06-24 DIAGNOSIS — R188 Other ascites: Secondary | ICD-10-CM | POA: Diagnosis not present

## 2023-06-24 DIAGNOSIS — B029 Zoster without complications: Secondary | ICD-10-CM | POA: Diagnosis not present

## 2023-06-24 DIAGNOSIS — Z79899 Other long term (current) drug therapy: Secondary | ICD-10-CM | POA: Insufficient documentation

## 2023-06-24 DIAGNOSIS — R6883 Chills (without fever): Secondary | ICD-10-CM | POA: Insufficient documentation

## 2023-06-24 DIAGNOSIS — Z923 Personal history of irradiation: Secondary | ICD-10-CM | POA: Diagnosis not present

## 2023-06-24 DIAGNOSIS — Z91199 Patient's noncompliance with other medical treatment and regimen due to unspecified reason: Secondary | ICD-10-CM | POA: Insufficient documentation

## 2023-06-24 DIAGNOSIS — Z79624 Long term (current) use of inhibitors of nucleotide synthesis: Secondary | ICD-10-CM | POA: Diagnosis not present

## 2023-06-24 DIAGNOSIS — R634 Abnormal weight loss: Secondary | ICD-10-CM | POA: Diagnosis not present

## 2023-06-24 NOTE — Progress Notes (Signed)
HEMATOLOGY/ONCOLOGY PHONE VISIT NOTE  Date of service:  06/24/23    Patient Care Team: Patient, No Pcp Per (Inactive) as PCP - General (General Practice)  Chief complaint: Follow-up for Hodgkin's lymphoma  Diagnosis:   Refractory Mixed cellularity Hodgkin's lymphoma IVBE with extensive lymphadenopathy including right axillary, mediastinal and upper retroperitoneal and now biopsy proven pulmonary involvement. She was noted to have significant constitutional symptoms including significant weight loss, fevers chills and some night sweats.  Current Treatment:  Lost to f/u for about 2 yrs  Previous treatment  5 cycles of AVD (without bleomycin due to lung involvement and DLCO of 37%, active smoker) Multiple avoidable treatment delays due to the patient's noncompliance with follow-up for avoidable reasons. She has been counseled repeatedly that this would increase the likelihood of unfavorable outcome.  2nd line therapy with Bendamustine + Brentuximab s/p 7 cycles.  3rd line therapy with ICE s/p 2 cycles   Pembrolizumab- 4th line. Patient declined HDT/Auto HSCT or consideration of Car-T cell therapy.  INTERVAL HISTORY:  Heidi [name] is a 37 y.o. female here for follow-up for her Hodgkins lymphoma. Patient was last seen by me on 05/27/2023 and complained of intermittent lower back pain radiating to her bilateral lower extremities, numbness/tingling in her lower extremities, inner pelvic pain with movement, groin lymph node, neck lymph nodes, rapid 10-15 pound weight loss, and a fall 7 months ago.   I connected with Heidi [name] on 06/24/23 at  8:40 AM EDT by telephone visit and verified that I am speaking with the correct person using two identifiers.   I discussed the limitations, risks, security and privacy concerns of performing an evaluation and management service by telemedicine and the availability of in-person appointments. I also discussed with the patient that there may  be a patient responsible charge related to this service. The patient expressed understanding and agreed to proceed.   Other persons participating in the visit and their role in the encounter: none   Patient's location: home  Provider's location: [facility]   Chief Complaint: Evaluation and management of Hodgkins lymphoma    Today, she reports that she does continue to have a port placed and was flushed at the time she received her PET scan. The results of her PET scan on 06/15/2023 was discussed with her in detail.   REVIEW OF SYSTEMS:   10 Point review of Systems was done is negative except as noted above.   Past Medical History:  Diagnosis Date   ARDS (adult respiratory distress syndrome) (HCC)    Asthma    Hodgkin lymphoma (HCC)    Hypertension     . Past Surgical History:  Procedure Laterality Date   AXILLARY LYMPH NODE BIOPSY Right 03/19/2016   Procedure: AXILLARY LYMPH NODE BIOPSY;  Surgeon: Darnell Level, MD;  Location: [facility];  Service: General;  Laterality: Right;   IR GENERIC HISTORICAL  12/22/2016   IR FLUORO GUIDE PORT INSERTION LEFT 12/22/2016 [facility]   IR GENERIC HISTORICAL  12/22/2016   IR US GUIDE VASC ACCESS LEFT 12/22/2016 [facility]   IR GENERIC HISTORICAL  12/22/2016   IR CV LINE INJECTION 12/22/2016 [facility]   IR GENERIC HISTORICAL  12/22/2016   IR REMOVAL TUN ACCESS W/ PORT W/O FL MOD SED 12/22/2016 [facility]   VIDEO BRONCHOSCOPY Bilateral 11/26/2016   Procedure: VIDEO BRONCHOSCOPY WITH FLUORO;  Surgeon: Oretha Milch, MD;  Location: [facility];  Service: Cardiopulmonary;  Laterality: Bilateral;    . Social History  Tobacco Use   Smoking status: Former    Packs/day: 0.50    Years: 15.00    Pack years: 7.50    Types: Cigarettes    Quit date: 03/27/2016    Years since quitting: 5.3   Smokeless tobacco: Never  Vaping Use   Vaping Use: Never used  Substance Use Topics   Alcohol use: Yes    Comment: occasional now    ALLERGIES:  has  No Known Allergies.  MEDICATIONS:  Current Outpatient Medications  Medication Sig Dispense Refill   acyclovir (ZOVIRAX) 400 MG tablet Take 1 tablet (400 mg total) by mouth 2 (two) times daily. Start after completion of Valtrex (Patient not taking: Reported on 01/11/2020) 60 tablet 6   amitriptyline (ELAVIL) 10 MG tablet Take 1 tablet (10 mg total) by mouth at bedtime. (Patient not taking: Reported on 01/11/2020) 20 tablet 0   gabapentin (NEURONTIN) 300 MG capsule Take 1 capsule (300 mg total) by mouth 2 (two) times daily. (Patient not taking: Reported on 01/11/2020) 60 capsule 0   oxyCODONE-acetaminophen (PERCOCET/ROXICET) 5-325 MG tablet Take 1-2 tablets by mouth every 6 (six) hours as needed for severe pain. (Patient not taking: Reported on 01/11/2020) 50 tablet 0   No current facility-administered medications for this visit.   Facility-Administered Medications Ordered in Other Visits  Medication Dose Route Frequency Provider Last Rate Last Admin   sodium chloride flush (NS) 0.9 % injection 10 mL  10 mL Intracatheter PRN [name], MD        PHYSICAL EXAMINATION: TELEMEDICINE VISIT  ECOG FS:1 - Symptomatic but completely ambulatory  Vitals:   05/27/23 1057  BP: 135/80  Resp: 17  Temp: 97.7 F (36.5 C)   Wt Readings from Last 3 Encounters:  06/22/21 140 lb (63.5 kg)  02/18/21 146 lb 14.4 oz (66.6 kg)  01/07/21 140 lb 6.4 oz (63.7 kg)   Body mass index is 21.08 kg/m.    LABORATORY DATA:   I have reviewed the data as listed  CBC Latest Ref Rng & Units 06/22/2021 02/17/2021 01/07/2021  WBC 4.0 - 10.5 K/uL 3.9(L) 3.9(L) 2.4(L)  Hemoglobin 12.0 - 15.0 g/dL 86.5 11.2(L) 13.1  Hematocrit 36.0 - 46.0 % 41.0 33.7(L) 39.6  Platelets 150 - 400 K/uL 201 163 183   . CBC    Component Value Date/Time   WBC 3.9 (L) 06/22/2021 0932   RBC 4.11 06/22/2021 0932   HGB 13.8 06/22/2021 0932   HGB 10.6 (L) 11/07/2018 1047   HGB 11.7 07/20/2017 1315   HCT 41.0 06/22/2021 0932    HCT 35.2 07/20/2017 1315   PLT 201 06/22/2021 0932   PLT 473 (H) 11/07/2018 1047   PLT 266 07/20/2017 1315   MCV 99.8 06/22/2021 0932   MCV 93.2 07/20/2017 1315   MCH 33.6 06/22/2021 0932   MCHC 33.7 06/22/2021 0932   RDW 11.9 06/22/2021 0932   RDW 14.5 07/20/2017 1315   LYMPHSABS 0.5 (L) 02/17/2021 1351   LYMPHSABS 0.3 (L) 07/20/2017 1315   MONOABS 0.6 02/17/2021 1351   MONOABS 0.5 07/20/2017 1315   EOSABS 0.1 02/17/2021 1351   EOSABS 0.2 07/20/2017 1315   BASOSABS 0.0 02/17/2021 1351   BASOSABS 0.0 07/20/2017 1315   . CMP Latest Ref Rng & Units 06/22/2021 02/17/2021 01/07/2021  Glucose 70 - 99 mg/dL 78(I) 84 95  BUN 6 - 20 mg/dL 9 10 6   Creatinine 0.44 - 1.00 mg/dL 6.96 2.95 2.84  Sodium 135 - 145 mmol/L 137 138 139  Potassium  3.5 - 5.1 mmol/L 3.9 4.3 4.0  Chloride 98 - 111 mmol/L 98 102 101  CO2 22 - 32 mmol/L 22 27 27   Calcium 8.9 - 10.3 mg/dL 9.3 1.6(X) 9.6  Total Protein 6.5 - 8.1 g/dL 7.1 7.0 7.9  Total Bilirubin 0.3 - 1.2 mg/dL 1.0 0.5 0.5  Alkaline Phos 38 - 126 U/L 53 59 80  AST 15 - 41 U/L 106(H) 29 40  ALT 0 - 44 U/L 54(H) 24 29     RADIOGRAPHIC STUDIES: I have personally reviewed the radiological images as listed and agreed with the findings in the report. CT Angio Chest PE W/Cm &/Or Wo Cm  Result Date: 06/22/2021 CLINICAL DATA:  Shortness of breath.  History of Hodgkin's lymphoma EXAM: CT ANGIOGRAPHY CHEST WITH CONTRAST TECHNIQUE: Multidetector CT imaging of the chest was performed using the standard protocol during bolus administration of intravenous contrast. Multiplanar CT image reconstructions and MIPs were obtained to evaluate the vascular anatomy. CONTRAST:  50mL OMNIPAQUE IOHEXOL 350 MG/ML SOLN COMPARISON:  Chest CT February 17, 2021; chest radiograph June 22, 2021 FINDINGS: Cardiovascular: No demonstrable pulmonary embolus. Note that there is fibrosis in the perihilar region on the right causing mild narrowing of several pulmonary arterial vessels on the  right, stable from prior study. There is no thoracic aortic aneurysm or dissection. Visualized great vessels appear unremarkable. There is evidence of a degree of left ventricular hypertrophy. No pericardial effusion or pericardial thickening. Main pulmonary outflow tract measures 3.1 cm, mildly prominent. Port-A-Cath tip in superior vena cava. Mediastinum/Nodes: 5 mm nodular opacity in the left lobe of the thyroid is stable; no additional imaging surveillance of this small nodular opacity warranted per consensus guidelines. No evident thoracic adenopathy. No esophageal lesions appreciable. Lungs/Pleura: There is stable scarring in the right upper lobe with localized reticular thickening. 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 There is no appreciable new opacity on the right. On the left, there are areas of relative mosaic attenuation without well-defined airspace opacity. No pleural effusions evident on either side. No pneumothorax. Trachea and major bronchial structures appear patent. Upper Abdomen: There is hepatic steatosis. Visualized upper abdominal structures otherwise appear unremarkable. Musculoskeletal: Stable sclerotic foci in the thoracic spine. No new bone lesions evident. No appreciable chest wall lesions. Review of the MIP images confirms the above findings. IMPRESSION: 1. No appreciable pulmonary embolus. No thoracic aortic aneurysm or dissection. There is left ventricular hypertrophy. 2. Prominence of the main pulmonary outflow tract may indicate a degree of pulmonary arterial hypertension. 3. Fibrosis on the right with volume loss and post radiation therapy changes, essentially stable from prior study. 4. Areas of mosaic attenuation on the left may represent a degree of underlying small airways obstructive disease. No edema or consolidation  appreciable. No pleural effusions. 5.  No evident adenopathy by size criteria. 6.  Bony changes indicative of treated lymphoma, stable. 7.  Hepatic steatosis. Electronically Signed   By: Bretta Bang III M.D.   On: 06/22/2021 12:31   DG Chest Port 1 View  Result Date: 06/22/2021 CLINICAL DATA:  Chest pain EXAM: PORTABLE CHEST 1 VIEW COMPARISON:  02/17/2021 CT FINDINGS: Cardiac shadow is enlarged but stable. Left chest wall port is again noted. Central changes of radiation fibrosis are again seen similar to that noted on prior CT with volume loss in the right lung. Left lung  is hyperinflated. No focal infiltrate or sizable effusion is seen. No bony abnormality is noted. 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 No new focal abnormality is noted. Electronically Signed   By: Alcide Clever M.D.   On: 06/22/2021 09:49    ASSESSMENT & PLAN:   37 y.o. African-American female with  #1 Refractory/Progressive Mixed cellularity Hodgkin's lymphoma IV BE with extensive lymphadenopathy including right axillary, mediastinal and upper retroperitoneal and now biopsy proven pulmonary involvement. She was noted to have significant constitutional symptoms including significant weight loss, fevers chills and some night sweats. HIV negative Hepatitis C and hepatitis B serologies negative. Echo with normal ejection fraction. Patient was treated with 5 cycles of AVD (Bleomycin held due to poor DLCO 37% and ongoing smoking). Multiple avoidable treatment delays due to the patient's noncompliance with follow-up for avoidable reasons. She has been counseled repeatedly that this would increase the likelihood of unfavorable outcome.   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 S/p 6 cycles of 2nd line treatment with Bendamustine/Brentuximab And 1 cycle of Bretuximab alone   -PET/CT scan results from 04/14/2017 were discussed in details. She appears to have some persistent  disease in her right lung at Deauville 5. It is difficult to say if this is recurrent or persistent disease since the patient failed to follow-up on multiple scheduled PET/CT scans in the early part and prior to her second line treatment.   Patient was lost to followup for >1 yr   09/26/18 PET/CT revealed Imaging findings compatible with recurrence of disease. 2. Multiple new large areas of hypermetabolic nodularity and airspace consolidation within both lungs which is presumed to represent pulmonary involvement by lymphoma. Deauville criteria 5. 3. New hypermetabolic left supraclavicular, left retroperitoneal, and bilateral pelvic lymph nodes. Deauville criteria 5. 4. Multifocal hypermetabolic osseous lesions.  Deauville criteria 5. 5. Small volume of ascites, new.   12/06/18 PET/CT revealed Generally improved appearance with previously mostly Deauville 5 activity now mostly Deauville 4 activity. 2. Layering of much of the airspace opacity in the lungs, although considerable right perihilar airspace opacity remains. The remaining pulmonary opacities are assess this Deauville 5 on the right and over L4 on the left, and were previously Deauville 5 bilaterally. 3. Mildly reduced size and moderately reduced activity in the abdominopelvic lymph nodes which are now dove L4. 4. The previously seen left supraclavicular lymph node seems to have completely resolved (Deauville 0). 5. The skeletal lesions remain at Deauville 4, although in absolute terms have decreased in SUV compared to previous. 6. No new regions of malignant involvement compared to prior exam. 7. Other imaging findings of potential clinical significance: Low-density blood pool suggests anemia. Pectus excavatum. Volume loss in the right hemithorax.  06/13/19 PET/CT revealed "No abnormal hypermetabolism (Deauville 1). 2. Mixed lytic and sclerotic osseous lesions appear more prominent than on 12/06/2018 but do not have associated hypermetabolism,  indicative of interval healing." 01/31/2020 CT ABDOMEN PELVIS W CONTRAST (Accession [id number]) and CT CHEST W CONTRAST (Accession [id number]) revealed "1. Small pre pericardial lymph node, not present on the prior study. 2. Stable volume loss in the right chest with shift of mediastinal structures into the right chest. 3. No change in the appearance of multifocal bony sclerosis, without signs of FDG uptake on the previous exam, attention on follow-up."   #2 s/p hypoxic respiratory failure with dense right lung consolidation and left upper lobe consolidation with SVC syndrome. Patient has completed palliative radiation to the right lung  mass causing SVC compression.  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   #4 Non compliance with clinic and treatment followup. Missed 2nd dose of bendamustine with C2. Has missed multiple appointment for her PET/CT and missed appointment at [facility] for consideration of Transplant.  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 Mildly hypermetabolic retroperitoneal and inguinal lymph nodes with hypermetabolic nodular consolidation in the left lung and hypermetabolic liver lesions, compatible with recurrent lymphoma (Deauville 5). 2. Treated osseous metastatic disease. 3. Trace pericardial and bilateral pleural  effusions. 4. Small pelvic ascites.  -Patient is anemic at this time. Hemoglobin has decreased from 13.8 to 12.3 and currently 10.7. -sedimentation rate increased from 2 to 58 -discussed potential proceeding treatment options including: Bone marrow transplant, which may be potentially curable Next line treatment considerations -patient is considering possibility of bone marrow transplant at this time  -will refer patient to [facility] with transplant physicians to discuss pre-transplant treatment recommendations and whether she is a candidate for CAR T-cell therapy or bone marrow transplant -patient will likely undergo chemo-immunotherapy regimen for a few cycles prior to consideration of a bone marrow transplant -will set up patient for pre transplant treatment regimen. Would recommend GVD-P chemo-immunotherapy  -patient will be receiving fifth line combination treatment involving chemotherapy and immunotherapy to knock back disease so that patient hopefully achieves remission -answered all of patient's questions in detail -will repeat echocardiogram to ensure that there is no change in heart function. Patient's previous ECHO 7 years ago was normal -continue Gabapentin nightly to manage nerve pain.  -patient reports that the best phone number to reach her is [phone]. Will update patient's phone number in the system.  FOLLOW-UP: ECHO in 3-5 days GVD-P starting in 10-12 days Referral to Dr Alphonsa Gin for consideration of AUtoHSCT for relapsed hodgkins lymphoma  The total time spent in the appointment was 40 minutes* .  All of the patient's questions were answered with apparent satisfaction. The patient knows to call the clinic with any problems, questions or concerns.   Wyvonnia Lora MD MS AAHIVMS [facility] Hematology/Oncology Physician [facility]  .*Total Encounter Time as defined by the Centers for Medicare and Medicaid Services includes, in addition to the face-to-face  time of a patient visit (documented in the note above) non-face-to-face time: obtaining and reviewing outside history, ordering and reviewing medications, tests or procedures, care coordination (communications with other health care professionals or caregivers) and documentation in the medical record.    I,Mitra Faeizi,acting as a Neurosurgeon for Wyvonnia Lora, MD.,have documented all relevant documentation on the behalf of Wyvonnia Lora, MD,as directed by  Wyvonnia Lora, MD while in the presence of Wyvonnia Lora, MD.  .I have reviewed the above documentation for accuracy and completeness, and I agree with the above. [name] MD

## 2023-06-30 ENCOUNTER — Encounter: Payer: Self-pay | Admitting: Hematology

## 2023-06-30 MED ORDER — LIDOCAINE-PRILOCAINE 2.5-2.5 % EX CREA
TOPICAL_CREAM | CUTANEOUS | 3 refills | Status: DC
Start: 2023-06-30 — End: 2023-09-10

## 2023-06-30 MED ORDER — ONDANSETRON HCL 8 MG PO TABS
8.0000 mg | ORAL_TABLET | Freq: Three times a day (TID) | ORAL | 1 refills | Status: DC | PRN
Start: 2023-06-30 — End: 2023-09-10

## 2023-06-30 MED ORDER — PROCHLORPERAZINE MALEATE 10 MG PO TABS
10.0000 mg | ORAL_TABLET | Freq: Four times a day (QID) | ORAL | 1 refills | Status: DC | PRN
Start: 2023-06-30 — End: 2023-09-10

## 2023-07-02 ENCOUNTER — Other Ambulatory Visit: Payer: Self-pay

## 2023-07-04 ENCOUNTER — Other Ambulatory Visit: Payer: Self-pay | Admitting: Hematology

## 2023-07-04 ENCOUNTER — Ambulatory Visit (HOSPITAL_COMMUNITY)
Admission: RE | Admit: 2023-07-04 | Discharge: 2023-07-04 | Disposition: A | Discharge status: Home / Self Care | Payer: Medicaid Other | Source: Ambulatory Visit | Attending: Cardiovascular Disease | Admitting: Cardiovascular Disease

## 2023-07-04 DIAGNOSIS — I3139 Other pericardial effusion (noninflammatory): Secondary | ICD-10-CM | POA: Diagnosis not present

## 2023-07-04 DIAGNOSIS — Z0189 Encounter for other specified special examinations: Secondary | ICD-10-CM | POA: Diagnosis not present

## 2023-07-04 DIAGNOSIS — I340 Nonrheumatic mitral (valve) insufficiency: Secondary | ICD-10-CM | POA: Insufficient documentation

## 2023-07-04 DIAGNOSIS — C8198 Hodgkin lymphoma, unspecified, lymph nodes of multiple sites: Secondary | ICD-10-CM | POA: Diagnosis not present

## 2023-07-04 DIAGNOSIS — C8128 Mixed cellularity classical Hodgkin lymphoma, lymph nodes of multiple sites: Secondary | ICD-10-CM

## 2023-07-04 DIAGNOSIS — Z7189 Other specified counseling: Secondary | ICD-10-CM

## 2023-07-04 LAB — ECHOCARDIOGRAM COMPLETE
AR max vel: 2.35 cm2
AV Area VTI: 2.13 cm2
AV Area mean vel: 2.24 cm2
AV Mean grad: 3 mmHg
AV Peak grad: 5.6 mmHg
Ao pk vel: 1.18 m/s
Area-P 1/2: 2.82 cm2
Calc EF: 68.9 %
MV M vel: 2.66 m/s
MV Peak grad: 28.3 mmHg
S' Lateral: 3 cm
Single Plane A2C EF: 67.5 %
Single Plane A4C EF: 68.1 %

## 2023-07-06 ENCOUNTER — Other Ambulatory Visit: Payer: Self-pay

## 2023-07-06 DIAGNOSIS — C8198 Hodgkin lymphoma, unspecified, lymph nodes of multiple sites: Secondary | ICD-10-CM

## 2023-07-07 ENCOUNTER — Ambulatory Visit: Payer: Medicaid Other

## 2023-07-08 ENCOUNTER — Other Ambulatory Visit: Payer: Self-pay

## 2023-07-09 ENCOUNTER — Other Ambulatory Visit: Payer: Self-pay

## 2023-07-09 ENCOUNTER — Encounter (HOSPITAL_COMMUNITY): Payer: Self-pay | Admitting: *Deleted

## 2023-07-09 ENCOUNTER — Emergency Department (HOSPITAL_COMMUNITY)
Admission: EM | Admit: 2023-07-09 | Discharge: 2023-07-10 | Disposition: A | Discharge status: Home / Self Care | Payer: Medicaid Other | Attending: Emergency Medicine | Admitting: Emergency Medicine

## 2023-07-09 DIAGNOSIS — Z760 Encounter for issue of repeat prescription: Secondary | ICD-10-CM | POA: Insufficient documentation

## 2023-07-09 MED ORDER — HYDROCODONE-ACETAMINOPHEN 5-325 MG PO TABS
1.0000 | ORAL_TABLET | Freq: Once | ORAL | Status: AC
  Administered 2023-07-09: 1 via ORAL
  Filled 2023-07-09: qty 1

## 2023-07-09 MED ORDER — HYDROCODONE-ACETAMINOPHEN 5-325 MG PO TABS: 1.0000 | ORAL_TABLET | Freq: Four times a day (QID) | ORAL | 0 refills | Status: DC | PRN

## 2023-07-09 NOTE — Discharge Instructions (Signed)
You were seen today requesting a refill of your hydrocodone.  I have provided a a refill to bridge your care until you are able to follow-up with oncology on Monday.  Please contact your oncologist to discuss further pain management recommendations.

## 2023-07-09 NOTE — ED Triage Notes (Signed)
The pt reports that she has cancer and that she ran out of her meds this past Friday and she had a rx for vicodin   and she needs a prescription

## 2023-07-09 NOTE — ED Provider Notes (Signed)
[facility] AT [facility] Provider Note   CSN: [id number] Arrival date & time: 07/09/23  2236     History  Chief Complaint  Patient presents with   needs pain med rx    Heidi [name] is a 37 y.o. female.  Patient with history of Hodgkin's lymphoma currently in recurrence with metastases presents to the emergency department requesting pain medicine refill.  Patient states she has been taking hydrocodone as prescribed by her oncologist and ran out on Friday.  She has been unable to reach them for a refill over the weekend.  She reports chronic pain due to her underlying malignancy.  She denies shortness of breath, chest pain, nausea, vomiting.  HPI     Home Medications Prior to Admission medications   Medication Sig Start Date End Date Taking? Authorizing Provider  HYDROcodone-acetaminophen (NORCO/VICODIN) 5-325 MG tablet Take 1 tablet by mouth every 6 (six) hours as needed. 07/09/23  Yes Barrie Dunker B, PA-C  gabapentin (NEURONTIN) 300 MG capsule Take 1 capsule (300 mg total) by mouth at bedtime. 05/27/23   [name], MD  lidocaine (LIDODERM) 5 % Place 1 patch onto the skin daily. Remove & Discard patch within 12 hours or as directed by MD 03/29/23   Prosperi, Ephriam Knuckles H, PA-C  lidocaine-prilocaine (EMLA) cream Apply to affected area once 06/30/23   [name], MD  methylPREDNISolone (MEDROL DOSEPAK) 4 MG TBPK tablet Take as directed 04/07/23   Dartha Lodge, PA-C  ondansetron (ZOFRAN) 8 MG tablet Take 1 tablet (8 mg total) by mouth every 8 (eight) hours as needed for nausea or vomiting. 06/30/23   [name], MD  prochlorperazine (COMPAZINE) 10 MG tablet Take 1 tablet (10 mg total) by mouth every 6 (six) hours as needed for nausea or vomiting. 06/30/23   [name], MD      Allergies    Patient has no known allergies.    Review of Systems   Review of Systems  Physical Exam Updated Vital Signs BP 129/81    Pulse (!) 102   Temp 98.4 F (36.9 C)   Resp 16   Ht 5\' 7"  (1.702 m)   Wt 60.8 kg   LMP 06/09/2023   SpO2 96%   BMI 20.99 kg/m  Physical Exam Vitals and nursing note reviewed.  HENT:     Head: Normocephalic and atraumatic.  Eyes:     Conjunctiva/sclera: Conjunctivae normal.  Cardiovascular:     Rate and Rhythm: Normal rate.  Pulmonary:     Effort: Pulmonary effort is normal. No respiratory distress.  Musculoskeletal:        General: No signs of injury.     Cervical back: Normal range of motion.  Skin:    General: Skin is dry.  Neurological:     Mental Status: She is alert.  Psychiatric:        Speech: Speech normal.        Behavior: Behavior normal.     ED Results / Procedures / Treatments   Labs (all labs ordered are listed, but only abnormal results are displayed) Labs Reviewed - No data to display  EKG None  Radiology No results found.  Procedures Procedures    Medications Ordered in ED Medications  HYDROcodone-acetaminophen (NORCO/VICODIN) 5-325 MG per tablet 1 tablet (1 tablet Oral Given 07/09/23 2311)    ED Course/ Medical Decision Making/ A&P  Medical Decision Making Risk Prescription drug management.   Patient presents to the emergency department requesting a medication refill.  The patient complains of chronic pain due to her recurring Hodgkin's lymphoma.  She denies any acute symptoms at this time.  I feel it is reasonable to provide the patient with a short refill of pain medication to get her through the weekend.  I explained to the patient that further refills/pain management should be arranged through her oncology or primary care team.  The patient voices understanding with this.  She states she will call her oncologist on Monday to discuss her current pain.  I see no indication for further emergent workup.  Refill provided.  Discharged home.        Final Clinical Impression(s) / ED Diagnoses Final  diagnoses:  Encounter for medication refill    Rx / DC Orders ED Discharge Orders          Ordered    HYDROcodone-acetaminophen (NORCO/VICODIN) 5-325 MG tablet  Every 6 hours PRN        07/09/23 2359              Pamala Duffel 07/10/23 0002    Zadie Rhine, MD 07/10/23 [phone]

## 2023-07-11 ENCOUNTER — Other Ambulatory Visit: Payer: Self-pay

## 2023-07-11 DIAGNOSIS — C8198 Hodgkin lymphoma, unspecified, lymph nodes of multiple sites: Secondary | ICD-10-CM

## 2023-07-12 ENCOUNTER — Other Ambulatory Visit: Payer: Self-pay | Admitting: Hematology

## 2023-07-12 ENCOUNTER — Other Ambulatory Visit: Payer: Self-pay

## 2023-07-12 ENCOUNTER — Encounter: Payer: Self-pay | Admitting: Hematology

## 2023-07-12 ENCOUNTER — Inpatient Hospital Stay: Payer: Medicaid Other | Attending: Hematology

## 2023-07-12 DIAGNOSIS — Z79633 Long term (current) use of mitotic inhibitor: Secondary | ICD-10-CM | POA: Insufficient documentation

## 2023-07-12 DIAGNOSIS — R0602 Shortness of breath: Secondary | ICD-10-CM | POA: Insufficient documentation

## 2023-07-12 DIAGNOSIS — Z5111 Encounter for antineoplastic chemotherapy: Secondary | ICD-10-CM | POA: Insufficient documentation

## 2023-07-12 DIAGNOSIS — C8128 Mixed cellularity classical Hodgkin lymphoma, lymph nodes of multiple sites: Secondary | ICD-10-CM

## 2023-07-12 DIAGNOSIS — C8120 Mixed cellularity classical Hodgkin lymphoma, unspecified site: Secondary | ICD-10-CM | POA: Insufficient documentation

## 2023-07-12 DIAGNOSIS — Z79620 Long term (current) use of immunosuppressive biologic: Secondary | ICD-10-CM | POA: Insufficient documentation

## 2023-07-12 DIAGNOSIS — Z79632 Long term (current) use of antitumor antibiotic: Secondary | ICD-10-CM | POA: Insufficient documentation

## 2023-07-12 DIAGNOSIS — R0789 Other chest pain: Secondary | ICD-10-CM | POA: Insufficient documentation

## 2023-07-12 DIAGNOSIS — R61 Generalized hyperhidrosis: Secondary | ICD-10-CM | POA: Insufficient documentation

## 2023-07-12 DIAGNOSIS — Z7189 Other specified counseling: Secondary | ICD-10-CM

## 2023-07-12 DIAGNOSIS — F17210 Nicotine dependence, cigarettes, uncomplicated: Secondary | ICD-10-CM | POA: Insufficient documentation

## 2023-07-12 DIAGNOSIS — Z5112 Encounter for antineoplastic immunotherapy: Secondary | ICD-10-CM | POA: Insufficient documentation

## 2023-07-12 DIAGNOSIS — Z79631 Long term (current) use of antimetabolite agent: Secondary | ICD-10-CM | POA: Insufficient documentation

## 2023-07-12 DIAGNOSIS — R509 Fever, unspecified: Secondary | ICD-10-CM | POA: Insufficient documentation

## 2023-07-12 DIAGNOSIS — Z79899 Other long term (current) drug therapy: Secondary | ICD-10-CM | POA: Insufficient documentation

## 2023-07-12 DIAGNOSIS — R634 Abnormal weight loss: Secondary | ICD-10-CM | POA: Insufficient documentation

## 2023-07-12 NOTE — Progress Notes (Signed)
Ms Crume states that requested a refill for her Norco 5-325 mg for her lower back pain.  She has 2 tablets left from ED visit 07-09-23. This prescribed twice after each ER visit for back pain. Pt stated that it was recommended that she see an orthopedic doctor for degenerative changes. She has not followed up with this recommendation. She also has not picked up the lidocaine patches from the pharmacy. Ms Newman stated that the medication needed prior authorization. Ms Lagasse stated that she has some pain in the right groin. Reviewed with Dr. Candise Che and he stated that the lymph nodes are small from scans and would not cause discomfort. He would not refill medication. Reviewed this with Ms Bucholz. Encouraged her to see orthopedic physician as recommended. She can also try applying head to lower back. Pt verbalized understanding.

## 2023-07-13 ENCOUNTER — Encounter: Payer: Self-pay | Admitting: Hematology

## 2023-07-13 ENCOUNTER — Other Ambulatory Visit: Payer: Self-pay

## 2023-07-13 MED FILL — Dexamethasone Sodium Phosphate Inj 100 MG/10ML: INTRAMUSCULAR | Qty: 1 | Status: AC

## 2023-07-13 NOTE — Progress Notes (Signed)
Called pt to introduce myself as her Dance movement psychotherapist and to discuss the [employer].  I went over what it covers and emailed her an expense sheet.  Pt would like to apply and since she's not working right now she will bring a letter of support on 07/14/23.  Once received I will approve her for the grant.

## 2023-07-14 ENCOUNTER — Inpatient Hospital Stay: Payer: Medicaid Other

## 2023-07-14 ENCOUNTER — Inpatient Hospital Stay (HOSPITAL_BASED_OUTPATIENT_CLINIC_OR_DEPARTMENT_OTHER): Payer: Medicaid Other | Admitting: Physician Assistant

## 2023-07-14 ENCOUNTER — Other Ambulatory Visit: Payer: Self-pay | Admitting: Hematology

## 2023-07-14 ENCOUNTER — Encounter: Payer: Self-pay | Admitting: Hematology

## 2023-07-14 ENCOUNTER — Other Ambulatory Visit: Payer: Self-pay

## 2023-07-14 VITALS — BP 155/95 | HR 93 | Temp 97.8°F | Resp 18 | Wt 139.4 lb

## 2023-07-14 DIAGNOSIS — T50905A Adverse effect of unspecified drugs, medicaments and biological substances, initial encounter: Secondary | ICD-10-CM

## 2023-07-14 DIAGNOSIS — C8198 Hodgkin lymphoma, unspecified, lymph nodes of multiple sites: Secondary | ICD-10-CM

## 2023-07-14 DIAGNOSIS — R634 Abnormal weight loss: Secondary | ICD-10-CM | POA: Diagnosis not present

## 2023-07-14 DIAGNOSIS — Z95828 Presence of other vascular implants and grafts: Secondary | ICD-10-CM

## 2023-07-14 DIAGNOSIS — C8120 Mixed cellularity classical Hodgkin lymphoma, unspecified site: Secondary | ICD-10-CM | POA: Diagnosis present

## 2023-07-14 DIAGNOSIS — C8128 Mixed cellularity classical Hodgkin lymphoma, lymph nodes of multiple sites: Secondary | ICD-10-CM

## 2023-07-14 DIAGNOSIS — R0602 Shortness of breath: Secondary | ICD-10-CM | POA: Diagnosis not present

## 2023-07-14 DIAGNOSIS — R61 Generalized hyperhidrosis: Secondary | ICD-10-CM | POA: Diagnosis not present

## 2023-07-14 DIAGNOSIS — Z79899 Other long term (current) drug therapy: Secondary | ICD-10-CM | POA: Diagnosis not present

## 2023-07-14 DIAGNOSIS — Z79633 Long term (current) use of mitotic inhibitor: Secondary | ICD-10-CM | POA: Diagnosis not present

## 2023-07-14 DIAGNOSIS — Z79620 Long term (current) use of immunosuppressive biologic: Secondary | ICD-10-CM | POA: Diagnosis not present

## 2023-07-14 DIAGNOSIS — Z79631 Long term (current) use of antimetabolite agent: Secondary | ICD-10-CM | POA: Diagnosis not present

## 2023-07-14 DIAGNOSIS — Z5111 Encounter for antineoplastic chemotherapy: Secondary | ICD-10-CM | POA: Diagnosis present

## 2023-07-14 DIAGNOSIS — R0789 Other chest pain: Secondary | ICD-10-CM | POA: Diagnosis not present

## 2023-07-14 DIAGNOSIS — Z5112 Encounter for antineoplastic immunotherapy: Secondary | ICD-10-CM | POA: Diagnosis present

## 2023-07-14 DIAGNOSIS — Z79632 Long term (current) use of antitumor antibiotic: Secondary | ICD-10-CM | POA: Diagnosis not present

## 2023-07-14 DIAGNOSIS — F17210 Nicotine dependence, cigarettes, uncomplicated: Secondary | ICD-10-CM | POA: Diagnosis not present

## 2023-07-14 DIAGNOSIS — Z7189 Other specified counseling: Secondary | ICD-10-CM

## 2023-07-14 DIAGNOSIS — R509 Fever, unspecified: Secondary | ICD-10-CM | POA: Diagnosis not present

## 2023-07-14 LAB — CBC WITH DIFFERENTIAL (CANCER CENTER ONLY)
Abs Immature Granulocytes: 0.02 10*3/uL (ref 0.00–0.07)
Basophils Absolute: 0 10*3/uL (ref 0.0–0.1)
Basophils Relative: 1 %
Eosinophils Absolute: 0.5 10*3/uL (ref 0.0–0.5)
Eosinophils Relative: 11 %
HCT: 33.4 % — ABNORMAL LOW (ref 36.0–46.0)
Hemoglobin: 11.1 g/dL — ABNORMAL LOW (ref 12.0–15.0)
Immature Granulocytes: 1 %
Lymphocytes Relative: 6 %
Lymphs Abs: 0.2 10*3/uL — ABNORMAL LOW (ref 0.7–4.0)
MCH: 31.5 pg (ref 26.0–34.0)
MCHC: 33.2 g/dL (ref 30.0–36.0)
MCV: 94.9 fL (ref 80.0–100.0)
Monocytes Absolute: 0.6 10*3/uL (ref 0.1–1.0)
Monocytes Relative: 15 %
Neutro Abs: 2.6 10*3/uL (ref 1.7–7.7)
Neutrophils Relative %: 66 %
Platelet Count: 339 10*3/uL (ref 150–400)
RBC: 3.52 MIL/uL — ABNORMAL LOW (ref 3.87–5.11)
RDW: 13.8 % (ref 11.5–15.5)
WBC Count: 4 10*3/uL (ref 4.0–10.5)
nRBC: 0 % (ref 0.0–0.2)

## 2023-07-14 LAB — CMP (CANCER CENTER ONLY)
ALT: 7 U/L (ref 0–44)
AST: 16 U/L (ref 15–41)
Albumin: 3.9 g/dL (ref 3.5–5.0)
Alkaline Phosphatase: 106 U/L (ref 38–126)
Anion gap: 7 (ref 5–15)
BUN: 10 mg/dL (ref 6–20)
CO2: 28 mmol/L (ref 22–32)
Calcium: 9.1 mg/dL (ref 8.9–10.3)
Chloride: 102 mmol/L (ref 98–111)
Creatinine: 0.82 mg/dL (ref 0.44–1.00)
GFR, Estimated: >60 mL/min (ref >60)
Glucose, Bld: 117 mg/dL — ABNORMAL HIGH (ref 70–99)
Potassium: 4.2 mmol/L (ref 3.5–5.1)
Sodium: 137 mmol/L (ref 135–145)
Total Bilirubin: 0.4 mg/dL (ref 0.3–1.2)
Total Protein: 6.8 g/dL (ref 6.5–8.1)

## 2023-07-14 LAB — TSH: TSH: 2.133 u[IU]/mL (ref 0.350–4.500)

## 2023-07-14 MED ORDER — DOXORUBICIN HCL LIPOSOMAL CHEMO INJECTION 2 MG/ML
15.0000 mg/m2 | Freq: Once | INTRAVENOUS | Status: AC
  Administered 2023-07-14: 26 mg via INTRAVENOUS
  Filled 2023-07-14: qty 13

## 2023-07-14 MED ORDER — VINORELBINE TARTRATE CHEMO INJECTION 50 MG/5ML
20.0000 mg/m2 | Freq: Once | INTRAVENOUS | Status: AC
  Administered 2023-07-14: 34 mg via INTRAVENOUS
  Filled 2023-07-14: qty 3.4

## 2023-07-14 MED ORDER — FAMOTIDINE IN NACL 20-0.9 MG/50ML-% IV SOLN
20.0000 mg | Freq: Once | INTRAVENOUS | Status: AC
  Administered 2023-07-14: 20 mg via INTRAVENOUS
  Filled 2023-07-14: qty 50

## 2023-07-14 MED ORDER — FUROSEMIDE 10 MG/ML IJ SOLN
20.0000 mg | Freq: Once | INTRAMUSCULAR | Status: AC
  Administered 2023-07-14: 20 mg via INTRAVENOUS
  Filled 2023-07-14: qty 2

## 2023-07-14 MED ORDER — HEPARIN SOD (PORK) LOCK FLUSH 100 UNIT/ML IV SOLN
500.0000 [IU] | Freq: Once | INTRAVENOUS | Status: AC | PRN
  Administered 2023-07-14: 500 [IU]

## 2023-07-14 MED ORDER — FAMOTIDINE IN NACL 20-0.9 MG/50ML-% IV SOLN
20.0000 mg | Freq: Once | INTRAVENOUS | Status: AC | PRN
  Administered 2023-07-14: 20 mg via INTRAVENOUS

## 2023-07-14 MED ORDER — DEXTROSE 5 % IV SOLN: Freq: Once | INTRAVENOUS | Status: AC

## 2023-07-14 MED ORDER — CETIRIZINE HCL 10 MG/ML IV SOLN
10.0000 mg | Freq: Once | INTRAVENOUS | Status: AC
  Administered 2023-07-14: 10 mg via INTRAVENOUS
  Filled 2023-07-14: qty 1

## 2023-07-14 MED ORDER — SODIUM CHLORIDE 0.9 % IV SOLN: Freq: Once | INTRAVENOUS | Status: AC

## 2023-07-14 MED ORDER — SODIUM CHLORIDE 0.9 % IV SOLN: Freq: Once | INTRAVENOUS | Status: DC | PRN

## 2023-07-14 MED ORDER — SODIUM CHLORIDE 0.9 % IV SOLN
10.0000 mg | Freq: Once | INTRAVENOUS | Status: AC
  Administered 2023-07-14: 10 mg via INTRAVENOUS
  Filled 2023-07-14: qty 10

## 2023-07-14 MED ORDER — SODIUM CHLORIDE 0.9% FLUSH
10.0000 mL | INTRAVENOUS | Status: DC | PRN
  Administered 2023-07-14: 10 mL

## 2023-07-14 MED ORDER — FUROSEMIDE 20 MG PO TABS: 20.0000 mg | ORAL_TABLET | Freq: Every day | ORAL | 0 refills | Status: DC

## 2023-07-14 MED ORDER — DIPHENHYDRAMINE HCL 50 MG/ML IJ SOLN
50.0000 mg | Freq: Once | INTRAMUSCULAR | Status: AC
  Administered 2023-07-14: 50 mg via INTRAVENOUS
  Filled 2023-07-14: qty 1

## 2023-07-14 MED ORDER — SODIUM CHLORIDE 0.9 % IV SOLN
200.0000 mg | Freq: Once | INTRAVENOUS | Status: AC
  Administered 2023-07-14: 200 mg via INTRAVENOUS
  Filled 2023-07-14: qty 200

## 2023-07-14 MED ORDER — PALONOSETRON HCL INJECTION 0.25 MG/5ML
0.2500 mg | Freq: Once | INTRAVENOUS | Status: AC
  Administered 2023-07-14: 0.25 mg via INTRAVENOUS
  Filled 2023-07-14: qty 5

## 2023-07-14 MED ORDER — METHYLPREDNISOLONE SODIUM SUCC 125 MG IJ SOLR
125.0000 mg | Freq: Once | INTRAMUSCULAR | Status: AC | PRN
  Administered 2023-07-14: 125 mg via INTRAVENOUS

## 2023-07-14 MED ORDER — ALBUTEROL SULFATE HFA 108 (90 BASE) MCG/ACT IN AERS
2.0000 | INHALATION_SPRAY | Freq: Once | RESPIRATORY_TRACT | Status: AC | PRN
  Administered 2023-07-14: 2 via RESPIRATORY_TRACT

## 2023-07-14 MED ORDER — SODIUM CHLORIDE 0.9 % IV SOLN
1000.0000 mg/m2 | Freq: Once | INTRAVENOUS | Status: AC
  Administered 2023-07-14: 1710 mg via INTRAVENOUS
  Filled 2023-07-14: qty 44.97

## 2023-07-14 NOTE — Progress Notes (Signed)
DATE:  07/14/23                                        X CHEMO/IMMUNOTHERAPY REACTION             MD: Candise Che   AGENT/BLOOD PRODUCT RECEIVING TODAY:              Doxorubicin HCL liposomal, Gemcitabine, pembrolizumab, vinorelbine   AGENT/BLOOD PRODUCT RECEIVING IMMEDIATELY PRIOR TO REACTION:          Doxorubicin HCL liposomal   VS: BP:     155/107   P:       95       SPO2:       98% 2L                BP:     130/97   P:       92       SPO2:       97% RA     REACTION(S):           chest tightness, shortness of breath   PREMEDS:     Benadryl 50 mg PO, Aloxi 0.25 mg IV, Pepcid 20 mg IV, Decadron 10 mg IV   INTERVENTION: Pepcid 20 mg IV, Solu-medrol 125 mg IV, Cetirizine 10 mg IV   Review of Systems  Review of Systems  Respiratory:  Positive for chest tightness and shortness of breath.   All other systems reviewed and are negative.    Physical Exam  Physical Exam Vitals reviewed.  Constitutional:      General: She is in acute distress.  HENT:     Head: Normocephalic.     Nose: Nose normal.     Mouth/Throat:     Mouth: Mucous membranes are moist.     Pharynx: Oropharynx is clear.  Eyes:     Conjunctiva/sclera: Conjunctivae normal.  Cardiovascular:     Rate and Rhythm: Normal rate and regular rhythm.     Pulses: Normal pulses.  Pulmonary:     Breath sounds: Normal breath sounds.     Comments: tachypneic Abdominal:     General: There is no distension.  Musculoskeletal:        General: Normal range of motion.     Cervical back: Normal range of motion.     Right lower leg: No edema.     Left lower leg: No edema.  Skin:    General: Skin is warm and dry.     OUTCOME:               Patient became symptomatic 2 minutes into Doxorubicin HCL liposomal infusion with chest tightness and shortness of breath.  Clear lung exam.  Emergency medications were administered as documented above. Patient returned to baseline. Oncologist notified and plans to remove Doxorubicin HCL  liposomal from treatment plan. RN noticed that oxygen saturation dropped to 85% on room air she was sitting in the chair.  During ambulation trial oxygen saturation 85% on room air.  Once patient sat down she quickly recovered to greater than 90% on room air.  Still has clear lung exam.  Discussed plan with Dr. Candise Che who agrees with plan for IV Lasix, albuterol inhaler and reassessment.  Approximately 20 minutes after IV Lasix patient ambulated and oxygen saturation was 90 to 92% on room air.  Patient continues to have normal work  of breathing and has not been in respiratory distress.  I encouraged patient to have ED evaluation for her hypoxia however she politely refuses.  Patient is adamant to go home as she is feeling normal.  I will prescribe patient 5-day course of p.o. Lasix and she was given an albuterol inhaler.  Discussed strict ED precautions including chest pain, chest tightness, shortness of breath, respiratory distress, hemoptysis.  Patient ambulated out of infusion center without difficulty. MD agreeable with plan.   I have spent a total of 30 minutes minutes of face-to-face and non-face-to-face time preparing to see the patient, performing a medically appropriate examination, counseling and educating the patient, ordering tests/procedures/medications, documenting clinical information in the electronic health record, and care coordination.

## 2023-07-14 NOTE — Progress Notes (Signed)
Hypersensitivity Reaction note  Date of event: 07/14/23 Time of event: 1323 Generic name of drug involved: Liposomal Doxorubicin Name of provider notified of the hypersensitivity reaction: Altamese Cabal, Kale MD.  Was agent that likely caused hypersensitivity reaction added to Allergies List within EMR? Yes Chain of events including reaction signs/symptoms, treatment administered, and outcome (e.g., drug resumed; drug discontinued; sent to Emergency Department; etc.).   Immediately following administration of medication, pt began to complain of chest tightness and SOB. RN stopped medication and flushed line with D5%. Pt placed on [place] on 2L O2. Pepcid, Solu-Medrol and Cetirizine administered (see MAR for times). 1L NS started open to gravity. See flowsheets for VS. Pt reported resolution of symptoms following medication interventions.   Pt found to be satting 89% on room air at 1400. Pt ambulated on room air per PA. Pt denied feeling SOB or dizziness, however pt desatted to 80%. Per MD, Lasix 20mg  and Albuterol MDI 2 Puff given. Pt ambulated again after 20 minutes per MD. Jonelle Sports stayed above 90%.   Pt given strict ED precautions by [facility] PA and discharged. VS and no complaints at time of discharge.   Rae Roam, RN 07/14/2023 1:39 PM

## 2023-07-14 NOTE — Patient Instructions (Signed)
[facility]  Discharge Instructions: Thank you for choosing [facility] to provide your oncology and hematology care.   If you have a lab appointment with the Cancer Center, please go directly to the Cancer Center and check in at the registration area.   Wear comfortable clothing and clothing appropriate for easy access to any Portacath or PICC line.   We strive to give you quality time with your provider. You may need to reschedule your appointment if you arrive late (15 or more minutes).  Arriving late affects you and other patients whose appointments are after yours.  Also, if you miss three or more appointments without notifying the office, you may be dismissed from the clinic at the provider's discretion.      For prescription refill requests, have your pharmacy contact our office and allow 72 hours for refills to be completed.    Today you received the following chemotherapy and/or immunotherapy agents: Keytruda, Navelbine, Gemcitabine, Liposomal Doxorubicin.       To help prevent nausea and vomiting after your treatment, we encourage you to take your nausea medication as directed.  BELOW ARE SYMPTOMS THAT SHOULD BE REPORTED IMMEDIATELY: *FEVER GREATER THAN 100.4 F (38 C) OR HIGHER *CHILLS OR SWEATING *NAUSEA AND VOMITING THAT IS NOT CONTROLLED WITH YOUR NAUSEA MEDICATION *UNUSUAL SHORTNESS OF BREATH *UNUSUAL BRUISING OR BLEEDING *URINARY PROBLEMS (pain or burning when urinating, or frequent urination) *BOWEL PROBLEMS (unusual diarrhea, constipation, pain near the anus) TENDERNESS IN MOUTH AND THROAT WITH OR WITHOUT PRESENCE OF ULCERS (sore throat, sores in mouth, or a toothache) UNUSUAL RASH, SWELLING OR PAIN  UNUSUAL VAGINAL DISCHARGE OR ITCHING   Items with * indicate a potential emergency and should be followed up as soon as possible or go to the Emergency Department if any problems should occur.  Please show the CHEMOTHERAPY  ALERT CARD or IMMUNOTHERAPY ALERT CARD at check-in to the Emergency Department and triage nurse.  Should you have questions after your visit or need to cancel or reschedule your appointment, please contact [facility]  Dept: [phone]  and follow the prompts.  Office hours are 8:00 a.m. to 4:30 p.m. Monday - Friday. Please note that voicemails left after 4:00 p.m. may not be returned until the following business day.  We are closed weekends and major holidays. You have access to a nurse at all times for urgent questions. Please call the main number to the clinic Dept: [phone] and follow the prompts.   For any non-urgent questions, you may also contact your provider using MyChart. We now offer e-Visits for anyone 56 and older to request care online for non-urgent symptoms. For details visit [email or web address].   Also download the MyChart app! Go to the app store, search "MyChart", open the app, select [facility], and log in with your MyChart username and password.  Pembrolizumab Injection What is this medication? PEMBROLIZUMAB (PEM broe LIZ ue mab) treats some types of cancer. It works by helping your immune system slow or stop the spread of cancer cells. It is a monoclonal antibody. This medicine may be used for other purposes; ask your health care provider or pharmacist if you have questions. COMMON BRAND NAME(S): Keytruda What should I tell my care team before I take this medication? They need to know if you have any of these conditions: Allogeneic stem cell transplant (uses someone else's stem cells) Autoimmune diseases, such as Crohn disease, ulcerative colitis, lupus  History of chest radiation Nervous system problems, such as Guillain-Barre syndrome, myasthenia gravis Organ transplant An unusual or allergic reaction to pembrolizumab, other medications, foods, dyes, or preservatives Pregnant or trying to get pregnant Breast-feeding How  should I use this medication? This medication is injected into a vein. It is given by your care team in a hospital or clinic setting. A special MedGuide will be given to you before each treatment. Be sure to read this information carefully each time. Talk to your care team about the use of this medication in children. While it may be prescribed for children as young as 6 months for selected conditions, precautions do apply. Overdosage: If you think you have taken too much of this medicine contact a poison control center or emergency room at once. NOTE: This medicine is only for you. Do not share this medicine with others. What if I miss a dose? Keep appointments for follow-up doses. It is important not to miss your dose. Call your care team if you are unable to keep an appointment. What may interact with this medication? Interactions have not been studied. This list may not describe all possible interactions. Give your health care provider a list of all the medicines, herbs, non-prescription drugs, or dietary supplements you use. Also tell them if you smoke, drink alcohol, or use illegal drugs. Some items may interact with your medicine. What should I watch for while using this medication? Your condition will be monitored carefully while you are receiving this medication. You may need blood work while taking this medication. This medication may cause serious skin reactions. They can happen weeks to months after starting the medication. Contact your care team right away if you notice fevers or flu-like symptoms with a rash. The rash may be red or purple and then turn into blisters or peeling of the skin. You may also notice a red rash with swelling of the face, lips, or lymph nodes in your neck or under your arms. Tell your care team right away if you have any change in your eyesight. Talk to your care team if you may be pregnant. Serious birth defects can occur if you take this medication during  pregnancy and for 4 months after the last dose. You will need a negative pregnancy test before starting this medication. Contraception is recommended while taking this medication and for 4 months after the last dose. Your care team can help you find the option that works for you. Do not breastfeed while taking this medication and for 4 months after the last dose. What side effects may I notice from receiving this medication? Side effects that you should report to your care team as soon as possible: Allergic reactions--skin rash, itching, hives, swelling of the face, lips, tongue, or throat Dry cough, shortness of breath or trouble breathing Eye pain, redness, irritation, or discharge with blurry or decreased vision Heart muscle inflammation--unusual weakness or fatigue, shortness of breath, chest pain, fast or irregular heartbeat, dizziness, swelling of the ankles, feet, or hands Hormone gland problems--headache, sensitivity to light, unusual weakness or fatigue, dizziness, fast or irregular heartbeat, increased sensitivity to cold or heat, excessive sweating, constipation, hair loss, increased thirst or amount of urine, tremors or shaking, irritability Infusion reactions--chest pain, shortness of breath or trouble breathing, feeling faint or lightheaded Kidney injury (glomerulonephritis)--decrease in the amount of urine, red or dark brown urine, foamy or bubbly urine, swelling of the ankles, hands, or feet Liver injury--right upper belly pain, loss of appetite, nausea,  light-colored stool, dark yellow or brown urine, yellowing skin or eyes, unusual weakness or fatigue Pain, tingling, or numbness in the hands or feet, muscle weakness, change in vision, confusion or trouble speaking, loss of balance or coordination, trouble walking, seizures Rash, fever, and swollen lymph nodes Redness, blistering, peeling, or loosening of the skin, including inside the mouth Sudden or severe stomach pain, bloody  diarrhea, fever, nausea, vomiting Side effects that usually do not require medical attention (report to your care team if they continue or are bothersome): Bone, joint, or muscle pain Diarrhea Fatigue Loss of appetite Nausea Skin rash This list may not describe all possible side effects. Call your doctor for medical advice about side effects. You may report side effects to FDA at 1-800-FDA-1088. Where should I keep my medication? This medication is given in a hospital or clinic. It will not be stored at home. NOTE: This sheet is a summary. It may not cover all possible information. If you have questions about this medicine, talk to your doctor, pharmacist, or health care provider.  2024 Elsevier/Gold Standard (2022-04-27 00:00:00) Vinorelbine Injection What is this medication? VINORELBINE (vi NOR el been) treats lung cancer. It works by slowing down the growth of cancer cells. This medicine may be used for other purposes; ask your health care provider or pharmacist if you have questions. COMMON BRAND NAME(S): Navelbine What should I tell my care team before I take this medication? They need to know if you have any of these conditions: Constipation Liver disease Low white blood cell levels Lung disease Tingling of the fingers or toes or other nerve disorder An unusual or allergic reaction to vinorelbine, other chemotherapy agents, other medications, foods, dyes, or preservatives Pregnant or trying to get pregnant Breast-feeding How should I use this medication? This medication is infused into a vein. It is given by your care team in a hospital or clinic setting. Talk to your care team about the use of this medication in children. Special care may be needed. Overdosage: If you think you have taken too much of this medicine contact a poison control center or emergency room at once. NOTE: This medicine is only for you. Do not share this medicine with others. What if I miss a dose? Keep  appointments for follow-up doses. It is important not to miss your dose. Call your care team if you are unable to keep an appointment. What may interact with this medication? Do not take this medication with any of the following: Live virus vaccines This medication may affect how other medications work, and other medications may affect the way this medication works. Talk with your care team about all of the medications you take. They may suggest changes to your treatment plan to lower the risk of side effects and to make sure your medications work as intended. This list may not describe all possible interactions. Give your health care provider a list of all the medicines, herbs, non-prescription drugs, or dietary supplements you use. Also tell them if you smoke, drink alcohol, or use illegal drugs. Some items may interact with your medicine. What should I watch for while using this medication? Your condition will be monitored carefully while you are receiving this medication. This medication may make you feel generally unwell. This is not uncommon as chemotherapy can affect healthy cells as well as cancer cells. Report any side effects. Continue your course of treatment even though you feel ill unless your care team tells you to stop. You may need  blood work while taking this medication. This medication will cause constipation. If you do not have a bowel movement for 3 days, call your care team. This medication may increase your risk to bruise or bleed. Call your care team if you notice any unusual bleeding. This medication may increase your risk of getting an infection. Call your care team for advice if you get a fever, chills, sore throat, or other symptoms of a cold or flu. Do not treat yourself. Try to avoid being around people who are sick. Be careful brushing or flossing your teeth or using a toothpick because you may get an infection or bleed more easily. If you have any dental work done, tell  your dentist you are receiving this medication. Talk to your care team if you or your partner wish to become pregnant or think either of you might be pregnant. This medication can cause serious birth defects if taken during pregnancy or for 6 months after stopping treatment. A negative pregnancy test is required before starting this medication. A reliable form of contraception is recommended while taking this medication and for 6 months after stopping treatment. Talk to your care team about effective forms of contraception. Use a condom during sex while taking this medication and for 3 months after the last dose. Tell your care team right away if you think your partner might be pregnant. This medication can cause serious birth defects. This medication may cause infertility. Talk to your care team if you are concerned about your fertility. Talk to your care team before breastfeeding. Changes to your treatment plan may be needed. What side effects may I notice from receiving this medication? Side effects that you should report to your care team as soon as possible: Allergic reactions--skin rash, itching, hives, swelling of the face, lips, tongue, or throat Bowel blockage--stomach cramping, unable to have a bowel movement or pass gas, loss of appetite, vomiting Infection--fever, chills, cough, sore throat, wounds that don't heal, pain or trouble when passing urine, general feeling of discomfort or being unwell Liver injury--right upper belly pain, loss of appetite, nausea, light-colored stool, dark yellow or brown urine, yellowing skin or eyes, unusual weakness or fatigue Low red blood cell level--unusual weakness or fatigue, dizziness, headache, trouble breathing Lung injury--shortness of breath or trouble breathing, cough, spitting up blood, chest pain, fever Painful swelling, warmth, or redness of the skin, blisters or sores at the infusion site Pain, tingling, or numbness in the hands or feet, muscle  weakness, change in vision, confusion or trouble speaking, loss of balance or coordination, trouble walking, seizures Unusual bruising or bleeding Side effects that usually do not require medical attention (report to your care team if they continue or are bothersome): Constipation Loss of appetite Nausea Unusual weakness or fatigue Vomiting This list may not describe all possible side effects. Call your doctor for medical advice about side effects. You may report side effects to FDA at 1-800-FDA-1088. Where should I keep my medication? This medication is given in a hospital or clinic. It will not be stored at home. NOTE: This sheet is a summary. It may not cover all possible information. If you have questions about this medicine, talk to your doctor, pharmacist, or health care provider.  2024 Elsevier/Gold Standard (2022-05-04 00:00:00) Gemcitabine Injection What is this medication? GEMCITABINE (jem SYE ta been) treats some types of cancer. It works by slowing down the growth of cancer cells. This medicine may be used for other purposes; ask your health care provider  or pharmacist if you have questions. COMMON BRAND NAME(S): Gemzar, Infugem What should I tell my care team before I take this medication? They need to know if you have any of these conditions: Blood disorders Infection Kidney disease Liver disease Lung or breathing disease, such as asthma or COPD Recent or ongoing radiation therapy An unusual or allergic reaction to gemcitabine, other medications, foods, dyes, or preservatives If you or your partner are pregnant or trying to get pregnant Breast-feeding How should I use this medication? This medication is injected into a vein. It is given by your care team in a hospital or clinic setting. Talk to your care team about the use of this medication in children. Special care may be needed. Overdosage: If you think you have taken too much of this medicine contact a poison  control center or emergency room at once. NOTE: This medicine is only for you. Do not share this medicine with others. What if I miss a dose? Keep appointments for follow-up doses. It is important not to miss your dose. Call your care team if you are unable to keep an appointment. What may interact with this medication? Interactions have not been studied. This list may not describe all possible interactions. Give your health care provider a list of all the medicines, herbs, non-prescription drugs, or dietary supplements you use. Also tell them if you smoke, drink alcohol, or use illegal drugs. Some items may interact with your medicine. What should I watch for while using this medication? Your condition will be monitored carefully while you are receiving this medication. This medication may make you feel generally unwell. This is not uncommon, as chemotherapy can affect healthy cells as well as cancer cells. Report any side effects. Continue your course of treatment even though you feel ill unless your care team tells you to stop. In some cases, you may be given additional medications to help with side effects. Follow all directions for their use. This medication may increase your risk of getting an infection. Call your care team for advice if you get a fever, chills, sore throat, or other symptoms of a cold or flu. Do not treat yourself. Try to avoid being around people who are sick. This medication may increase your risk to bruise or bleed. Call your care team if you notice any unusual bleeding. Be careful brushing or flossing your teeth or using a toothpick because you may get an infection or bleed more easily. If you have any dental work done, tell your dentist you are receiving this medication. Avoid taking medications that contain aspirin, acetaminophen, ibuprofen, naproxen, or ketoprofen unless instructed by your care team. These medications may hide a fever. Talk to your care team if you or  your partner wish to become pregnant or think you might be pregnant. This medication can cause serious birth defects if taken during pregnancy and for 6 months after the last dose. A negative pregnancy test is required before starting this medication. A reliable form of contraception is recommended while taking this medication and for 6 months after the last dose. Talk to your care team about effective forms of contraception. Do not father a child while taking this medication and for 3 months after the last dose. Use a condom while having sex during this time period. Do not breastfeed while taking this medication and for at least 1 week after the last dose. This medication may cause infertility. Talk to your care team if you are concerned about your fertility.  What side effects may I notice from receiving this medication? Side effects that you should report to your care team as soon as possible: Allergic reactions--skin rash, itching, hives, swelling of the face, lips, tongue, or throat Capillary leak syndrome--stomach or muscle pain, unusual weakness or fatigue, feeling faint or lightheaded, decrease in the amount of urine, swelling of the ankles, hands, or feet, trouble breathing Infection--fever, chills, cough, sore throat, wounds that don't heal, pain or trouble when passing urine, general feeling of discomfort or being unwell Liver injury--right upper belly pain, loss of appetite, nausea, light-colored stool, dark yellow or brown urine, yellowing skin or eyes, unusual weakness or fatigue Low red blood cell level--unusual weakness or fatigue, dizziness, headache, trouble breathing Lung injury--shortness of breath or trouble breathing, cough, spitting up blood, chest pain, fever Stomach pain, bloody diarrhea, pale skin, unusual weakness or fatigue, decrease in the amount of urine, which may be signs of hemolytic uremic syndrome Sudden and severe headache, confusion, change in vision, seizures, which  may be signs of posterior reversible encephalopathy syndrome (PRES) Unusual bruising or bleeding Side effects that usually do not require medical attention (report to your care team if they continue or are bothersome): Diarrhea Drowsiness Hair loss Nausea Pain, redness, or swelling with sores inside the mouth or throat Vomiting This list may not describe all possible side effects. Call your doctor for medical advice about side effects. You may report side effects to FDA at 1-800-FDA-1088. Where should I keep my medication? This medication is given in a hospital or clinic. It will not be stored at home. NOTE: This sheet is a summary. It may not cover all possible information. If you have questions about this medicine, talk to your doctor, pharmacist, or health care provider.  2024 Elsevier/Gold Standard (2022-04-20 00:00:00) Doxorubicin Liposomal Injection What is this medication? DOXORUBICIN LIPOSOMAL (dox oh ROO bi sin LIP oh som al) treats some types of cancer. It works by slowing down the growth of cancer cells. This medicine may be used for other purposes; ask your health care provider or pharmacist if you have questions. COMMON BRAND NAME(S): Doxil, Lipodox What should I tell my care team before I take this medication? They need to know if you have any of these conditions: Blood disorder Heart disease Infection especially a viral infection, such as chickenpox, cold sores, herpes Liver disease Recent or ongoing radiation An unusual or allergic reaction to doxorubicin, soybeans, other medications, foods, dyes, or preservatives If you or your partner are pregnant or trying to get pregnant Breast-feeding How should I use this medication? This medication is injected into a vein. It is given by your care team in a hospital or clinic setting. Talk to your care team about the use of this medication in children. Special care may be needed. Overdosage: If you think you have taken too much  of this medicine contact a poison control center or emergency room at once. NOTE: This medicine is only for you. Do not share this medicine with others. What if I miss a dose? Keep appointments for follow-up doses. It is important not to miss your dose. Call your care team if you are unable to keep an appointment. What may interact with this medication? Do not take this medication with any of the following: Zidovudine This medication may also interact with the following: Medications to increase blood counts, such as filgrastim, pegfilgrastim, sargramostim Vaccines This list may not describe all possible interactions. Give your health care provider a list of all  the medicines, herbs, non-prescription drugs, or dietary supplements you use. Also tell them if you smoke, drink alcohol, or use illegal drugs. Some items may interact with your medicine. What should I watch for while using this medication? Your condition will be monitored carefully while you are receiving this medication. You may need blood work while taking this medication. This medication may make you feel generally unwell. This is not uncommon as chemotherapy can affect healthy cells as well as cancer cells. Report any side effects. Continue your course of treatment even though you feel ill unless your care team tells you to stop. Your urine may turn orange-red for a few days after your dose. This is not blood. If your urine is dark or brown, call your care team. In some cases, you may be given additional medications to help with side effects. Follow all directions for their use. Talk to your care team about your risk of cancer. You may be more at risk for certain types of cancers if you take this medication. You should make sure that you get enough Coenzyme Q10 while you are taking this medication. Discuss the foods you eat and the vitamins you take with your care team. Talk to your care team if you or your partner may be pregnant.  Serious birth defects can occur if you take this medication during pregnancy and for 6 months after the last dose. Contraception is recommended while taking this medication and for 6 months after the last dose. Your care team can help you find the option that works for you. If your partner can get pregnant, use a condom while taking this medication and for 6 months after the last dose. Do not breastfeed while taking this medication. This medication may cause infertility. Talk to your care team if you are concerned about your fertility. What side effects may I notice from receiving this medication? Side effects that you should report to your care team as soon as possible: Allergic reactions--skin rash, itching, hives, swelling of the face, lips, tongue, or throat Heart failure--shortness of breath, swelling of the ankles, feet, or hands, sudden weight gain, unusual weakness or fatigue Infection--fever, chills, cough, sore throat, wounds that don't heal, pain or trouble when passing urine, general feeling of discomfort or being unwell Infusion reactions--chest pain, shortness of breath or trouble breathing, feeling faint or lightheaded Low red blood cell level--unusual weakness or fatigue, dizziness, headache, trouble breathing Redness, swelling, and blistering of the skin over hands and feet Unusual bruising or bleeding Side effects that usually do not require medical attention (report to your care team if they continue or are bothersome): Constipation Diarrhea Loss of appetite Nausea Pain, redness, or swelling with sores inside the mouth or throat Red urine Unusual weakness or fatigue This list may not describe all possible side effects. Call your doctor for medical advice about side effects. You may report side effects to FDA at 1-800-FDA-1088. Where should I keep my medication? This medication is given in a hospital or clinic. It will not be stored at home. NOTE: This sheet is a summary. It  may not cover all possible information. If you have questions about this medicine, talk to your doctor, pharmacist, or health care provider.  2024 Elsevier/Gold Standard (2022-04-22 00:00:00)

## 2023-07-14 NOTE — Progress Notes (Signed)
Pt is approved for the $1000 [employer] grant.  

## 2023-07-16 LAB — T4: T4, Total: 6.4 ug/dL (ref 4.5–12.0)

## 2023-07-18 ENCOUNTER — Telehealth: Payer: Self-pay | Admitting: *Deleted

## 2023-07-18 NOTE — Telephone Encounter (Signed)
Called pt to see how she was doing post chemo/reaction.  She reports feeling well & denies any problems.  She has been taking her lasix & will finish.  She denies questions/concerns & states she knows how to reach [place] & knows her next appts.

## 2023-07-18 NOTE — Telephone Encounter (Signed)
-----   Message from Nurse Threasa Beards sent at 07/14/2023  3:41 PM EDT ----- Regarding: Dr Candise Che pt, first time Navelbine, Gemcitabine, Liposomal Doxorubicin Dr Candise Che pt came in 7/18 for first time Navelbine, Gemcitabine, and Liposomal Doxorubicin.   Pt tolerated first two infusions well. Pt did not tolerate Liposomal Doxorubicin. Pt had hypersensitivity reaction (see notes from 7/18). Pt needed to be given Lasix and Albuterol due to declining oxygen saturations. Pt is supposed to be taking PO Lasix at home over the next few days to prevent fluid overload.   VS were normal and pt did not have any complaints at time of discharge.   Needs call back.

## 2023-07-21 MED FILL — Dexamethasone Sodium Phosphate Inj 100 MG/10ML: INTRAMUSCULAR | Qty: 1 | Status: AC

## 2023-07-22 ENCOUNTER — Inpatient Hospital Stay: Payer: Medicaid Other

## 2023-07-22 ENCOUNTER — Other Ambulatory Visit: Payer: Self-pay

## 2023-07-22 ENCOUNTER — Inpatient Hospital Stay (HOSPITAL_BASED_OUTPATIENT_CLINIC_OR_DEPARTMENT_OTHER): Payer: Medicaid Other | Admitting: Physician Assistant

## 2023-07-22 VITALS — BP 128/88 | HR 88 | Temp 98.7°F | Resp 16 | Wt 138.5 lb

## 2023-07-22 DIAGNOSIS — Z7189 Other specified counseling: Secondary | ICD-10-CM

## 2023-07-22 DIAGNOSIS — C8128 Mixed cellularity classical Hodgkin lymphoma, lymph nodes of multiple sites: Secondary | ICD-10-CM

## 2023-07-22 DIAGNOSIS — Z5112 Encounter for antineoplastic immunotherapy: Secondary | ICD-10-CM | POA: Diagnosis not present

## 2023-07-22 DIAGNOSIS — Z95828 Presence of other vascular implants and grafts: Secondary | ICD-10-CM

## 2023-07-22 DIAGNOSIS — Z5111 Encounter for antineoplastic chemotherapy: Secondary | ICD-10-CM

## 2023-07-22 LAB — CBC WITH DIFFERENTIAL (CANCER CENTER ONLY)
Abs Immature Granulocytes: 0.01 10*3/uL (ref 0.00–0.07)
Basophils Absolute: 0 10*3/uL (ref 0.0–0.1)
Basophils Relative: 1 %
Eosinophils Absolute: 0.2 10*3/uL (ref 0.0–0.5)
Eosinophils Relative: 8 %
HCT: 30.2 % — ABNORMAL LOW (ref 36.0–46.0)
Hemoglobin: 10.2 g/dL — ABNORMAL LOW (ref 12.0–15.0)
Immature Granulocytes: 1 %
Lymphocytes Relative: 13 %
Lymphs Abs: 0.3 10*3/uL — ABNORMAL LOW (ref 0.7–4.0)
MCH: 31.2 pg (ref 26.0–34.0)
MCHC: 33.8 g/dL (ref 30.0–36.0)
MCV: 92.4 fL (ref 80.0–100.0)
Monocytes Absolute: 0.1 10*3/uL (ref 0.1–1.0)
Monocytes Relative: 6 %
Neutro Abs: 1.6 10*3/uL — ABNORMAL LOW (ref 1.7–7.7)
Neutrophils Relative %: 71 %
Platelet Count: 219 10*3/uL (ref 150–400)
RBC: 3.27 MIL/uL — ABNORMAL LOW (ref 3.87–5.11)
RDW: 13.2 % (ref 11.5–15.5)
WBC Count: 2.2 10*3/uL — ABNORMAL LOW (ref 4.0–10.5)
nRBC: 0 % (ref 0.0–0.2)

## 2023-07-22 LAB — CMP (CANCER CENTER ONLY)
ALT: 8 U/L (ref 0–44)
AST: 13 U/L — ABNORMAL LOW (ref 15–41)
Albumin: 3.9 g/dL (ref 3.5–5.0)
Alkaline Phosphatase: 106 U/L (ref 38–126)
Anion gap: 6 (ref 5–15)
BUN: 9 mg/dL (ref 6–20)
CO2: 29 mmol/L (ref 22–32)
Calcium: 8.8 mg/dL — ABNORMAL LOW (ref 8.9–10.3)
Chloride: 104 mmol/L (ref 98–111)
Creatinine: 0.73 mg/dL (ref 0.44–1.00)
GFR, Estimated: >60 mL/min (ref >60)
Glucose, Bld: 66 mg/dL — ABNORMAL LOW (ref 70–99)
Potassium: 4.2 mmol/L (ref 3.5–5.1)
Sodium: 139 mmol/L (ref 135–145)
Total Bilirubin: 0.3 mg/dL (ref 0.3–1.2)
Total Protein: 6.4 g/dL — ABNORMAL LOW (ref 6.5–8.1)

## 2023-07-22 MED ORDER — SODIUM CHLORIDE 0.9% FLUSH
10.0000 mL | INTRAVENOUS | Status: DC | PRN
  Administered 2023-07-22: 10 mL

## 2023-07-22 MED ORDER — PALONOSETRON HCL INJECTION 0.25 MG/5ML
0.2500 mg | Freq: Once | INTRAVENOUS | Status: AC
  Administered 2023-07-22: 0.25 mg via INTRAVENOUS
  Filled 2023-07-22: qty 5

## 2023-07-22 MED ORDER — VINORELBINE TARTRATE CHEMO INJECTION 50 MG/5ML
20.0000 mg/m2 | Freq: Once | INTRAVENOUS | Status: AC
  Administered 2023-07-22: 34 mg via INTRAVENOUS
  Filled 2023-07-22: qty 3.4

## 2023-07-22 MED ORDER — DEXTROSE 5 % IV SOLN: Freq: Once | INTRAVENOUS | Status: AC

## 2023-07-22 MED ORDER — SODIUM CHLORIDE 0.9 % IV SOLN
1000.0000 mg/m2 | Freq: Once | INTRAVENOUS | Status: AC
  Administered 2023-07-22: 1710 mg via INTRAVENOUS
  Filled 2023-07-22: qty 44.97

## 2023-07-22 MED ORDER — SODIUM CHLORIDE 0.9 % IV SOLN
10.0000 mg | Freq: Once | INTRAVENOUS | Status: AC
  Administered 2023-07-22: 10 mg via INTRAVENOUS
  Filled 2023-07-22: qty 10

## 2023-07-22 MED ORDER — HEPARIN SOD (PORK) LOCK FLUSH 100 UNIT/ML IV SOLN
500.0000 [IU] | Freq: Once | INTRAVENOUS | Status: AC | PRN
  Administered 2023-07-22: 500 [IU]

## 2023-07-22 MED ORDER — SODIUM CHLORIDE 0.9 % IV SOLN: Freq: Once | INTRAVENOUS | Status: DC

## 2023-07-22 NOTE — Patient Instructions (Signed)
[facility] [facility]  Discharge Instructions: Thank you for choosing [facility] to provide your oncology and hematology care.   If you have a lab appointment with the Cancer Center, please go directly to the Cancer Center and check in at the registration area.   Wear comfortable clothing and clothing appropriate for easy access to any Portacath or PICC line.   We strive to give you quality time with your provider. You may need to reschedule your appointment if you arrive late (15 or more minutes).  Arriving late affects you and other patients whose appointments are after yours.  Also, if you miss three or more appointments without notifying the office, you may be dismissed from the clinic at the provider's discretion.      For prescription refill requests, have your pharmacy contact our office and allow 72 hours for refills to be completed.    Today you received the following chemotherapy and/or immunotherapy agents: Navelbine/Gemzar      To help prevent nausea and vomiting after your treatment, we encourage you to take your nausea medication as directed.  BELOW ARE SYMPTOMS THAT SHOULD BE REPORTED IMMEDIATELY: *FEVER GREATER THAN 100.4 F (38 C) OR HIGHER *CHILLS OR SWEATING *NAUSEA AND VOMITING THAT IS NOT CONTROLLED WITH YOUR NAUSEA MEDICATION *UNUSUAL SHORTNESS OF BREATH *UNUSUAL BRUISING OR BLEEDING *URINARY PROBLEMS (pain or burning when urinating, or frequent urination) *BOWEL PROBLEMS (unusual diarrhea, constipation, pain near the anus) TENDERNESS IN MOUTH AND THROAT WITH OR WITHOUT PRESENCE OF ULCERS (sore throat, sores in mouth, or a toothache) UNUSUAL RASH, SWELLING OR PAIN  UNUSUAL VAGINAL DISCHARGE OR ITCHING   Items with * indicate a potential emergency and should be followed up as soon as possible or go to the Emergency Department if any problems should occur.  Please show the CHEMOTHERAPY ALERT CARD or IMMUNOTHERAPY ALERT CARD  at check-in to the Emergency Department and triage nurse.  Should you have questions after your visit or need to cancel or reschedule your appointment, please contact [place] [facility]  Dept: [phone]  and follow the prompts.  Office hours are 8:00 a.m. to 4:30 p.m. Monday - Friday. Please note that voicemails left after 4:00 p.m. may not be returned until the following business day.  We are closed weekends and major holidays. You have access to a nurse at all times for urgent questions. Please call the main number to the clinic Dept: [phone] and follow the prompts.   For any non-urgent questions, you may also contact your provider using MyChart. We now offer e-Visits for anyone 81 and older to request care online for non-urgent symptoms. For details visit [email or web address].   Also download the MyChart app! Go to the app store, search "MyChart", open the app, select [place], and log in with your MyChart username and password.

## 2023-07-24 ENCOUNTER — Encounter: Payer: Self-pay | Admitting: Hematology

## 2023-07-24 NOTE — Progress Notes (Signed)
HEMATOLOGY/ONCOLOGY PHONE VISIT NOTE  Date of service:  07/22/23   Patient Care Team: Pcp, No as PCP - General   Diagnosis: Refractory Mixed cellularity Hodgkin's lymphoma IVBE with extensive lymphadenopathy including right axillary, mediastinal and upper retroperitoneal and now biopsy proven pulmonary involvement.   PRIOR TREATMENT: 5 cycles of AVD (without bleomycin due to lung involvement and DLCO of 37%, active smoker). Multiple avoidable treatment delays due to the patient's noncompliance with follow-up for avoidable reasons. She has been counseled repeatedly that this would increase the likelihood of unfavorable outcome.  2nd line therapy with Bendamustine + Brentuximab s/p 7 cycles. 3rd line therapy with ICE s/p 2 cycles  4th line therapy with pembrolizumab  CURRENT TREATMENT: Doxorubicin/Gemcitabine/Pembrolizumab/Vinorelbine, started on 07/14/2023. Developed infusion reaction with doxorubicin with Cycle 1 so discontinued.   INTERVAL HISTORY:  Heidi [name] is a 37 y.o. female here for follow-up for her Hodgkins lymphoma prior to Cycle 1, Day 8 of chemotherapy. She is unaccompanied for this visit.   Heidi [name] reports that she has tolerated the first treatment except for the infusion reaction with doxorubicin. After treatment she felt good and energy was stable. She is eating well. She denies nausea, vomiting or bowel habit changes. She denies easy bruising or signs of bleeding. She denies fevers, chills, sweats, shortness of breath, chest pain or cough. She has no other complaints.   REVIEW OF SYSTEMS:   10 Point review of Systems was done is negative except as noted above.   Past Medical History:  Diagnosis Date   ARDS (adult respiratory distress syndrome) (HCC)    Asthma    Hodgkin lymphoma (HCC)    Hypertension     . Past Surgical History:  Procedure Laterality Date   AXILLARY LYMPH NODE BIOPSY Right 03/19/2016   Procedure: AXILLARY LYMPH NODE BIOPSY;  Surgeon:  Darnell Level, MD;  Location: [facility];  Service: General;  Laterality: Right;   IR GENERIC HISTORICAL  12/22/2016   IR FLUORO GUIDE PORT INSERTION LEFT 12/22/2016 [facility]   IR GENERIC HISTORICAL  12/22/2016   IR US GUIDE VASC ACCESS LEFT 12/22/2016 [facility]   IR GENERIC HISTORICAL  12/22/2016   IR CV LINE INJECTION 12/22/2016 [facility]   IR GENERIC HISTORICAL  12/22/2016   IR REMOVAL TUN ACCESS W/ PORT W/O FL MOD SED 12/22/2016 [facility]   VIDEO BRONCHOSCOPY Bilateral 11/26/2016   Procedure: VIDEO BRONCHOSCOPY WITH FLUORO;  Surgeon: Oretha Milch, MD;  Location: [facility];  Service: Cardiopulmonary;  Laterality: Bilateral;    . Social History   Tobacco Use   Smoking status: Every Day    Current packs/day: 0.00    Average packs/day: 0.5 packs/day for 15.0 years (7.5 ttl pk-yrs)    Types: Cigarettes    Start date: 03/27/2001    Last attempt to quit: 03/27/2016    Years since quitting: 7.3   Smokeless tobacco: Never  Vaping Use   Vaping status: Never Used  Substance Use Topics   Alcohol use: Yes    Comment: occasional now   Drug use: Not Currently    ALLERGIES:  is allergic to doxorubicin hcl liposomal.  MEDICATIONS:  Current Outpatient Medications  Medication Sig Dispense Refill   furosemide (LASIX) 20 MG tablet Take 1 tablet (20 mg total) by mouth daily for 5 days. 5 tablet 0   gabapentin (NEURONTIN) 300 MG capsule Take 1 capsule (300 mg total) by mouth at bedtime. 30 capsule 1   HYDROcodone-acetaminophen (NORCO/VICODIN) 5-325 MG tablet Take 1  tablet by mouth every 6 (six) hours as needed. 10 tablet 0   lidocaine (LIDODERM) 5 % Place 1 patch onto the skin daily. Remove & Discard patch within 12 hours or as directed by MD 30 patch 0   lidocaine-prilocaine (EMLA) cream Apply to affected area once 30 g 3   methylPREDNISolone (MEDROL DOSEPAK) 4 MG TBPK tablet Take as directed 21 tablet 0   ondansetron (ZOFRAN) 8 MG tablet Take 1 tablet (8 mg total) by mouth every  8 (eight) hours as needed for nausea or vomiting. 30 tablet 1   prochlorperazine (COMPAZINE) 10 MG tablet Take 1 tablet (10 mg total) by mouth every 6 (six) hours as needed for nausea or vomiting. 30 tablet 1   No current facility-administered medications for this visit.   Facility-Administered Medications Ordered in Other Visits  Medication Dose Route Frequency Provider Last Rate Last Admin   sodium chloride flush (NS) 0.9 % injection 10 mL  10 mL Intracatheter PRN [name], MD        PHYSICAL EXAMINATION: TELEMEDICINE VISIT  ECOG FS:1 - Symptomatic but completely ambulatory  There were no vitals filed for this visit.  Wt Readings from Last 3 Encounters:  07/22/23 138 lb 8 oz (62.8 kg)  07/14/23 139 lb 6.4 oz (63.2 kg)  07/09/23 134 lb (60.8 kg)   Constitutional: Oriented to person, place, and time and well-developed, well-nourished, and in no distress.  HENT:  Head: Normocephalic and atraumatic.   Cardiovascular: Normal rate, regular rhythm, normal heart sounds Pulmonary/Chest: Effort normal and breath sounds normal. No respiratory distress. No wheezes. No rales.  Musculoskeletal: Normal range of motion. Exhibits no edema.  Neurological: Alert and oriented to person, place, and time. Exhibits normal muscle tone. Gait normal. Coordination normal.  Skin: Skin is warm and dry. No rash noted. Not diaphoretic. No erythema. No pallor.  Psychiatric: Mood, memory and judgment normal.   LABORATORY DATA:   I have reviewed the data as listed     Latest Ref Rng & Units 07/22/2023   11:43 AM 07/14/2023    9:17 AM 05/27/2023    9:47 AM  CBC  WBC 4.0 - 10.5 K/uL 2.2  4.0  6.8   Hemoglobin 12.0 - 15.0 g/dL 91.4  78.2  95.6   Hematocrit 36.0 - 46.0 % 30.2  33.4  32.8   Platelets 150 - 400 K/uL 219  339  380    . CBC    Component Value Date/Time   WBC 2.2 (L) 07/22/2023 1143   WBC 2.3 (L) 07/15/2021 0854   RBC 3.27 (L) 07/22/2023 1143   HGB 10.2 (L) 07/22/2023 1143    HGB 11.7 07/20/2017 1315   HCT 30.2 (L) 07/22/2023 1143   HCT 35.2 07/20/2017 1315   PLT 219 07/22/2023 1143   PLT 266 07/20/2017 1315   MCV 92.4 07/22/2023 1143   MCV 93.2 07/20/2017 1315   MCH 31.2 07/22/2023 1143   MCHC 33.8 07/22/2023 1143   RDW 13.2 07/22/2023 1143   RDW 14.5 07/20/2017 1315   LYMPHSABS 0.3 (L) 07/22/2023 1143   LYMPHSABS 0.3 (L) 07/20/2017 1315   MONOABS 0.1 07/22/2023 1143   MONOABS 0.5 07/20/2017 1315   EOSABS 0.2 07/22/2023 1143   EOSABS 0.2 07/20/2017 1315   BASOSABS 0.0 07/22/2023 1143   BASOSABS 0.0 07/20/2017 1315   .    Latest Ref Rng & Units 07/22/2023   11:43 AM 07/14/2023    9:17 AM 05/27/2023    9:47 AM  CMP  Glucose 70 - 99 mg/dL 66  161  096   BUN 6 - 20 mg/dL 9  10  8    Creatinine 0.44 - 1.00 mg/dL 0.45  4.09  8.11   Sodium 135 - 145 mmol/L 139  137  134   Potassium 3.5 - 5.1 mmol/L 4.2  4.2  3.6   Chloride 98 - 111 mmol/L 104  102  96   CO2 22 - 32 mmol/L 29  28  30    Calcium 8.9 - 10.3 mg/dL 8.8  9.1  9.1   Total Protein 6.5 - 8.1 g/dL 6.4  6.8  7.1   Total Bilirubin 0.3 - 1.2 mg/dL 0.3  0.4  0.5   Alkaline Phos 38 - 126 U/L 106  106  87   AST 15 - 41 U/L 13  16  14    ALT 0 - 44 U/L 8  7  9       RADIOGRAPHIC STUDIES: I have personally reviewed the radiological images as listed and agreed with the findings in the report. ECHOCARDIOGRAM COMPLETE  Result Date: 07/04/2023    ECHOCARDIOGRAM REPORT   Patient Name:   Heidi [name] Date of Exam: 07/04/2023 Medical Rec #:  [id number]          Height:       67.0 in Accession #:    [id number]         Weight:       134.6 lb Date of Birth:  [birth year]         BSA:          1.709 m Patient Age:    36 years           BP:           135/80 mmHg Patient Gender: F                  HR:           94 bpm. Exam Location:  Outpatient Procedure: 2D Echo, 3D Echo, Cardiac Doppler, Color Doppler and Strain Analysis Indications:    Hodgkin lymphoma of lymph nodes of multiple regions, unspecified                  Hodgkin lymphoma type (HCC) [C81.98 (ICD-10-CM)]  History:        Patient has prior history of Echocardiogram examinations, most                 recent 08/18/2017. Hodgkins's Lymphoma, Signs/Symptoms:Dyspnea;                 Risk Factors:Former Smoker and Hypertension.  Sonographer:    Jake Seats RDMS, RVT, RDCS Referring Phys: [name] IMPRESSIONS  1. Left ventricular ejection fraction, by estimation, is 65 to 70%. Left ventricular ejection fraction by 3D volume is 66 %. The left ventricle has normal function. The left ventricle has no regional wall motion abnormalities. Left ventricular diastolic  parameters were normal. The average left ventricular global longitudinal strain is -22.6 %. The global longitudinal strain is normal.  2. Right ventricular systolic function is normal. The right ventricular size is normal. Tricuspid regurgitation signal is inadequate for assessing PA pressure.  3. Moderate pericardial effusion. There is no evidence of cardiac tamponade.  4. Suspect prolapse of the posterior mitral leaflet. There is an extremely eccentric, anteriorly and medially directed. This suggests that the lateral scallop (P1) may be the culprit. The regurgitation severity may be underestimated. The mitral  valve is  myxomatous. Moderate mitral valve regurgitation.  5. The aortic valve is tricuspid. Aortic valve regurgitation is not visualized. No aortic stenosis is present.  6. The inferior vena cava is normal in size with greater than 50% respiratory variability, suggesting right atrial pressure of 3 mmHg. Comparison(s): Echocardiogram done 08/18/17 showed an EF of 55-60%. FINDINGS  Left Ventricle: Left ventricular ejection fraction, by estimation, is 65 to 70%. Left ventricular ejection fraction by 3D volume is 66 %. The left ventricle has normal function. The left ventricle has no regional wall motion abnormalities. The average left ventricular global longitudinal strain is -22.6 %. The global  longitudinal strain is normal. The left ventricular internal cavity size was normal in size. There is no left ventricular hypertrophy. Left ventricular diastolic parameters were normal. Right Ventricle: The right ventricular size is normal. No increase in right ventricular wall thickness. Right ventricular systolic function is normal. Tricuspid regurgitation signal is inadequate for assessing PA pressure. Left Atrium: Left atrial size was normal in size. Right Atrium: Right atrial size was normal in size. Pericardium: A moderately sized pericardial effusion is present. There is no evidence of cardiac tamponade. Mitral Valve: Suspect prolapse of the posterior mitral leaflet. There is an extremely eccentric, anteriorly and medially directed. This suggests that the lateral scallop (P1) may be the culprit. The regurgitation severity may be underestimated. The mitral valve is myxomatous. Moderate mitral valve regurgitation. Tricuspid Valve: The tricuspid valve is normal in structure. Tricuspid valve regurgitation is not demonstrated. Aortic Valve: The aortic valve is tricuspid. Aortic valve regurgitation is not visualized. No aortic stenosis is present. Aortic valve mean gradient measures 3.0 mmHg. Aortic valve peak gradient measures 5.6 mmHg. Aortic valve area, by VTI measures 2.13 cm. Pulmonic Valve: The pulmonic valve was not well visualized. Pulmonic valve regurgitation is not visualized. Aorta: The aortic root is normal in size and structure. Venous: The inferior vena cava is normal in size with greater than 50% respiratory variability, suggesting right atrial pressure of 3 mmHg. IAS/Shunts: No atrial level shunt detected by color flow Doppler.  LEFT VENTRICLE PLAX 2D LVIDd:         4.30 cm         Diastology LVIDs:         3.00 cm         LV e' medial:    11.70 cm/s LV PW:         1.20 cm         LV E/e' medial:  8.0 LV IVS:        1.10 cm         LV e' lateral:   15.30 cm/s LVOT diam:     1.90 cm         LV E/e'  lateral: 6.2 LV SV:         46 LV SV Index:   27              2D LVOT Area:     2.84 cm        Longitudinal                                Strain                                2D Strain GLS  -24.2 % LV Volumes (MOD)               (  A2C): LV vol d, MOD    57.8 ml       2D Strain GLS  -23.4 % A2C:                           (A3C): LV vol d, MOD    78.6 ml       2D Strain GLS  -20.2 % A4C:                           (A4C): LV vol s, MOD    18.8 ml       2D Strain GLS  -22.6 % A2C:                           Avg: LV vol s, MOD    25.1 ml A4C:                           3D Volume EF LV SV MOD A2C:   39.0 ml       LV 3D EF:    Left LV SV MOD A4C:   78.6 ml                    ventricul LV SV MOD BP:    48.4 ml                    ar                                             ejection                                             fraction                                             by 3D                                             volume is                                             66 %.                                 3D Volume EF:                                3D EF:        66 %  LV EDV:       87 ml                                LV ESV:       30 ml                                LV SV:        58 ml RIGHT VENTRICLE RV S prime:     10.00 cm/s TAPSE (M-mode): 2.3 cm LEFT ATRIUM             Index        RIGHT ATRIUM          Index LA diam:        2.00 cm 1.17 cm/m   RA Area:     8.65 cm LA Vol (A2C):   25.8 ml 15.10 ml/m  RA Volume:   18.60 ml 10.88 ml/m LA Vol (A4C):   33.6 ml 19.66 ml/m LA Biplane Vol: 29.8 ml 17.44 ml/m  AORTIC VALVE AV Area (Vmax):    2.35 cm AV Area (Vmean):   2.24 cm AV Area (VTI):     2.13 cm AV Vmax:           118.00 cm/s AV Vmean:          76.200 cm/s AV VTI:            0.216 m AV Peak Grad:      5.6 mmHg AV Mean Grad:      3.0 mmHg LVOT Vmax:         97.90 cm/s LVOT Vmean:        60.300 cm/s LVOT VTI:          0.162 m LVOT/AV VTI ratio: 0.75  AORTA Ao Root diam:  3.30 cm MITRAL VALVE MV Area (PHT): 2.82 cm    SHUNTS MV Decel Time: 269 msec    Systemic VTI:  0.16 m MR Peak grad: 28.3 mmHg    Systemic Diam: 1.90 cm MR Vmax:      266.00 cm/s MV E velocity: 94.10 cm/s MV A velocity: 98.30 cm/s MV E/A ratio:  0.96 Mihai Croitoru MD Electronically signed by Thurmon Fair MD Signature Date/Time: 07/04/2023/4:51:06 PM    Final     ASSESSMENT & PLAN:  Heidi [name] is a 37 y.o. African-American female with refractory hodgkin's lymphoma.   #1 Refractory/Progressive Mixed cellularity Hodgkin's lymphoma IV -Extensive lymphadenopathy including right axillary, mediastinal and upper retroperitoneal and now biopsy proven pulmonary involvement. She was noted to have significant constitutional symptoms including significant weight loss, fevers chills and some night sweats. -See treatment history as above. Most recently, patient was lost to follow up for nearly 2 years.  -Most recent treatment includes Doxorubicin/Gemcitabine/Pembrolizumab/Vinorelbine which was started on 07/14/2023. Developed infusion reaction with doxorubicin with Cycle 1 so discontinued.    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 -Patient has completed palliative radiation to the right lung mass causing SVC compression.   #Non compliance with clinic and treatment followup. Missed 2nd dose of bendamustine with C2. Has missed multiple appointment for her PET/CT and missed appointment at [facility] for consideration of Transplant.  Pt was lost to follow up after July 2018 and returned on 08/31/18 Discussed the patient's goals of  care and the pt noted that she is ready to begin treatment again and maintain compliance and follow ups     #H/O Herpes Zoster infection -Currently resolved but with significant post herpetic neuralgia   PLAN: -Due for Cycle 1, Day 8 today -Labs from today were reviewed and adequate for  treatment. WBC 2.2, Hgb 10.2, MCV 92.4, Plt 219, creatinine and LFTs in range.  -Proceed with treatment today without any dose modifications.  -Sent referral to [facility] with transplant physicians to discuss pre-transplant treatment recommendations and whether she is a candidate for CAR T-cell therapy or bone marrow transplant  FOLLOW-UP: 08/04/2023: Cycle 2, Day 1 08/11/2023: Cycle 2, Day 8 (follow up with Dr. Candise Che  All of the patient's questions were answered with apparent satisfaction. The patient knows to call the clinic with any problems, questions or concerns.   I have spent a total of 30 minutes minutes of face-to-face and non-face-to-face time, preparing to see the patient, performing a medically appropriate examination, counseling and educating the patient, documenting clinical information in the electronic health record,  and care coordination.   Georga Kaufmann PA-C Dept of Hematology and Oncology [facility] Phone: [phone]

## 2023-07-25 ENCOUNTER — Inpatient Hospital Stay: Payer: Medicaid Other

## 2023-07-25 ENCOUNTER — Other Ambulatory Visit: Payer: Self-pay

## 2023-07-25 DIAGNOSIS — C8128 Mixed cellularity classical Hodgkin lymphoma, lymph nodes of multiple sites: Secondary | ICD-10-CM

## 2023-07-25 DIAGNOSIS — Z5112 Encounter for antineoplastic immunotherapy: Secondary | ICD-10-CM | POA: Diagnosis not present

## 2023-07-25 DIAGNOSIS — Z7189 Other specified counseling: Secondary | ICD-10-CM

## 2023-07-25 MED ORDER — PEGFILGRASTIM-CBQV 6 MG/0.6ML ~~LOC~~ SOSY
6.0000 mg | PREFILLED_SYRINGE | Freq: Once | SUBCUTANEOUS | Status: AC
  Administered 2023-07-25: 6 mg via SUBCUTANEOUS
  Filled 2023-07-25: qty 0.6

## 2023-07-26 ENCOUNTER — Encounter: Payer: Self-pay | Admitting: Hematology

## 2023-07-28 ENCOUNTER — Ambulatory Visit: Payer: Medicaid Other

## 2023-07-28 ENCOUNTER — Other Ambulatory Visit: Payer: Medicaid Other

## 2023-07-29 ENCOUNTER — Ambulatory Visit: Payer: Medicaid Other

## 2023-07-29 ENCOUNTER — Other Ambulatory Visit: Payer: Medicaid Other

## 2023-08-01 ENCOUNTER — Ambulatory Visit: Payer: Medicaid Other

## 2023-08-01 ENCOUNTER — Other Ambulatory Visit: Payer: Medicaid Other

## 2023-08-03 MED FILL — Dexamethasone Sodium Phosphate Inj 100 MG/10ML: INTRAMUSCULAR | Qty: 1 | Status: AC

## 2023-08-04 ENCOUNTER — Inpatient Hospital Stay: Payer: Medicaid Other

## 2023-08-04 ENCOUNTER — Inpatient Hospital Stay: Payer: Medicaid Other | Attending: Hematology

## 2023-08-04 ENCOUNTER — Other Ambulatory Visit: Payer: Self-pay

## 2023-08-04 VITALS — BP 147/98 | HR 83 | Temp 98.3°F | Resp 16 | Ht 67.0 in | Wt 139.2 lb

## 2023-08-04 DIAGNOSIS — R61 Generalized hyperhidrosis: Secondary | ICD-10-CM | POA: Insufficient documentation

## 2023-08-04 DIAGNOSIS — C8128 Mixed cellularity classical Hodgkin lymphoma, lymph nodes of multiple sites: Secondary | ICD-10-CM

## 2023-08-04 DIAGNOSIS — Z5189 Encounter for other specified aftercare: Secondary | ICD-10-CM | POA: Insufficient documentation

## 2023-08-04 DIAGNOSIS — F17210 Nicotine dependence, cigarettes, uncomplicated: Secondary | ICD-10-CM | POA: Diagnosis not present

## 2023-08-04 DIAGNOSIS — R188 Other ascites: Secondary | ICD-10-CM | POA: Diagnosis not present

## 2023-08-04 DIAGNOSIS — Z79631 Long term (current) use of antimetabolite agent: Secondary | ICD-10-CM | POA: Insufficient documentation

## 2023-08-04 DIAGNOSIS — R634 Abnormal weight loss: Secondary | ICD-10-CM | POA: Insufficient documentation

## 2023-08-04 DIAGNOSIS — Z79620 Long term (current) use of immunosuppressive biologic: Secondary | ICD-10-CM | POA: Diagnosis not present

## 2023-08-04 DIAGNOSIS — M549 Dorsalgia, unspecified: Secondary | ICD-10-CM | POA: Diagnosis not present

## 2023-08-04 DIAGNOSIS — B029 Zoster without complications: Secondary | ICD-10-CM | POA: Insufficient documentation

## 2023-08-04 DIAGNOSIS — Z5111 Encounter for antineoplastic chemotherapy: Secondary | ICD-10-CM | POA: Diagnosis present

## 2023-08-04 DIAGNOSIS — Z79633 Long term (current) use of mitotic inhibitor: Secondary | ICD-10-CM | POA: Diagnosis not present

## 2023-08-04 DIAGNOSIS — R591 Generalized enlarged lymph nodes: Secondary | ICD-10-CM | POA: Insufficient documentation

## 2023-08-04 DIAGNOSIS — R6883 Chills (without fever): Secondary | ICD-10-CM | POA: Diagnosis not present

## 2023-08-04 DIAGNOSIS — Z95828 Presence of other vascular implants and grafts: Secondary | ICD-10-CM

## 2023-08-04 DIAGNOSIS — Z7189 Other specified counseling: Secondary | ICD-10-CM

## 2023-08-04 DIAGNOSIS — Z5112 Encounter for antineoplastic immunotherapy: Secondary | ICD-10-CM | POA: Insufficient documentation

## 2023-08-04 DIAGNOSIS — Z79899 Other long term (current) drug therapy: Secondary | ICD-10-CM | POA: Diagnosis not present

## 2023-08-04 LAB — CBC WITH DIFFERENTIAL (CANCER CENTER ONLY)
Abs Immature Granulocytes: 4.25 10*3/uL — ABNORMAL HIGH (ref 0.00–0.07)
Basophils Absolute: 0.1 10*3/uL (ref 0.0–0.1)
Basophils Relative: 1 %
Eosinophils Absolute: 0.2 10*3/uL (ref 0.0–0.5)
Eosinophils Relative: 1 %
HCT: 29.3 % — ABNORMAL LOW (ref 36.0–46.0)
Hemoglobin: 9.8 g/dL — ABNORMAL LOW (ref 12.0–15.0)
Immature Granulocytes: 26 %
Lymphocytes Relative: 4 %
Lymphs Abs: 0.7 10*3/uL (ref 0.7–4.0)
MCH: 30.6 pg (ref 26.0–34.0)
MCHC: 33.4 g/dL (ref 30.0–36.0)
MCV: 91.6 fL (ref 80.0–100.0)
Monocytes Absolute: 1.1 10*3/uL — ABNORMAL HIGH (ref 0.1–1.0)
Monocytes Relative: 6 %
Neutro Abs: 10.2 10*3/uL — ABNORMAL HIGH (ref 1.7–7.7)
Neutrophils Relative %: 62 %
Platelet Count: 149 10*3/uL — ABNORMAL LOW (ref 150–400)
RBC: 3.2 MIL/uL — ABNORMAL LOW (ref 3.87–5.11)
RDW: 14.6 % (ref 11.5–15.5)
WBC Count: 16.5 10*3/uL — ABNORMAL HIGH (ref 4.0–10.5)
nRBC: 0.5 % — ABNORMAL HIGH (ref 0.0–0.2)

## 2023-08-04 LAB — CMP (CANCER CENTER ONLY)
ALT: 9 U/L (ref 0–44)
AST: 18 U/L (ref 15–41)
Albumin: 4.3 g/dL (ref 3.5–5.0)
Alkaline Phosphatase: 162 U/L — ABNORMAL HIGH (ref 38–126)
Anion gap: 10 (ref 5–15)
BUN: 12 mg/dL (ref 6–20)
CO2: 26 mmol/L (ref 22–32)
Calcium: 9 mg/dL (ref 8.9–10.3)
Chloride: 104 mmol/L (ref 98–111)
Creatinine: 0.89 mg/dL (ref 0.44–1.00)
GFR, Estimated: >60 mL/min (ref >60)
Glucose, Bld: 76 mg/dL (ref 70–99)
Potassium: 4.2 mmol/L (ref 3.5–5.1)
Sodium: 140 mmol/L (ref 135–145)
Total Bilirubin: 0.3 mg/dL (ref 0.3–1.2)
Total Protein: 7.1 g/dL (ref 6.5–8.1)

## 2023-08-04 MED ORDER — SODIUM CHLORIDE 0.9 % IV SOLN
1000.0000 mg/m2 | Freq: Once | INTRAVENOUS | Status: AC
  Administered 2023-08-04: 1710 mg via INTRAVENOUS
  Filled 2023-08-04: qty 44.97

## 2023-08-04 MED ORDER — SODIUM CHLORIDE 0.9 % IV SOLN: Freq: Once | INTRAVENOUS | Status: AC

## 2023-08-04 MED ORDER — SODIUM CHLORIDE 0.9% FLUSH
10.0000 mL | INTRAVENOUS | Status: DC | PRN
  Administered 2023-08-04: 10 mL

## 2023-08-04 MED ORDER — HEPARIN SOD (PORK) LOCK FLUSH 100 UNIT/ML IV SOLN
500.0000 [IU] | Freq: Once | INTRAVENOUS | Status: AC | PRN
  Administered 2023-08-04: 500 [IU]

## 2023-08-04 MED ORDER — PALONOSETRON HCL INJECTION 0.25 MG/5ML
0.2500 mg | Freq: Once | INTRAVENOUS | Status: AC
  Administered 2023-08-04: 0.25 mg via INTRAVENOUS
  Filled 2023-08-04: qty 5

## 2023-08-04 MED ORDER — VINORELBINE TARTRATE CHEMO INJECTION 50 MG/5ML
20.0000 mg/m2 | Freq: Once | INTRAVENOUS | Status: AC
  Administered 2023-08-04: 34 mg via INTRAVENOUS
  Filled 2023-08-04: qty 3.4

## 2023-08-04 MED ORDER — SODIUM CHLORIDE 0.9 % IV SOLN
10.0000 mg | Freq: Once | INTRAVENOUS | Status: AC
  Administered 2023-08-04: 10 mg via INTRAVENOUS
  Filled 2023-08-04: qty 10

## 2023-08-04 MED ORDER — SODIUM CHLORIDE 0.9 % IV SOLN
200.0000 mg | Freq: Once | INTRAVENOUS | Status: AC
  Administered 2023-08-04: 200 mg via INTRAVENOUS
  Filled 2023-08-04: qty 200

## 2023-08-04 NOTE — Patient Instructions (Addendum)
[place] [facility]  Discharge Instructions: Thank you for choosing [facility] to provide your oncology and hematology care.   If you have a lab appointment with the Cancer Center, please go directly to the Cancer Center and check in at the registration area.   Wear comfortable clothing and clothing appropriate for easy access to any Portacath or PICC line.   We strive to give you quality time with your provider. You may need to reschedule your appointment if you arrive late (15 or more minutes).  Arriving late affects you and other patients whose appointments are after yours.  Also, if you miss three or more appointments without notifying the office, you may be dismissed from the clinic at the provider's discretion.      For prescription refill requests, have your pharmacy contact our office and allow 72 hours for refills to be completed.    Today you received the following chemotherapy and/or immunotherapy agents: Keytruda/Navelbine/Gemzar      To help prevent nausea and vomiting after your treatment, we encourage you to take your nausea medication as directed.  BELOW ARE SYMPTOMS THAT SHOULD BE REPORTED IMMEDIATELY: *FEVER GREATER THAN 100.4 F (38 C) OR HIGHER *CHILLS OR SWEATING *NAUSEA AND VOMITING THAT IS NOT CONTROLLED WITH YOUR NAUSEA MEDICATION *UNUSUAL SHORTNESS OF BREATH *UNUSUAL BRUISING OR BLEEDING *URINARY PROBLEMS (pain or burning when urinating, or frequent urination) *BOWEL PROBLEMS (unusual diarrhea, constipation, pain near the anus) TENDERNESS IN MOUTH AND THROAT WITH OR WITHOUT PRESENCE OF ULCERS (sore throat, sores in mouth, or a toothache) UNUSUAL RASH, SWELLING OR PAIN  UNUSUAL VAGINAL DISCHARGE OR ITCHING   Items with * indicate a potential emergency and should be followed up as soon as possible or go to the Emergency Department if any problems should occur.  Please show the CHEMOTHERAPY ALERT CARD or IMMUNOTHERAPY  ALERT CARD at check-in to the Emergency Department and triage nurse.  Should you have questions after your visit or need to cancel or reschedule your appointment, please contact [place] [facility]  Dept: [phone]  and follow the prompts.  Office hours are 8:00 a.m. to 4:30 p.m. Monday - Friday. Please note that voicemails left after 4:00 p.m. may not be returned until the following business day.  We are closed weekends and major holidays. You have access to a nurse at all times for urgent questions. Please call the main number to the clinic Dept: [phone] and follow the prompts.   For any non-urgent questions, you may also contact your provider using MyChart. We now offer e-Visits for anyone 11 and older to request care online for non-urgent symptoms. For details visit [email or web address].   Also download the MyChart app! Go to the app store, search "MyChart", open the app, select [place], and log in with your MyChart username and password.

## 2023-08-04 NOTE — Progress Notes (Signed)
Per lab counting ANC appears to be 10

## 2023-08-05 ENCOUNTER — Ambulatory Visit: Payer: Medicaid Other

## 2023-08-05 ENCOUNTER — Other Ambulatory Visit: Payer: Medicaid Other

## 2023-08-05 ENCOUNTER — Ambulatory Visit: Payer: Medicaid Other | Admitting: Physician Assistant

## 2023-08-08 ENCOUNTER — Ambulatory Visit: Payer: Medicaid Other

## 2023-08-10 ENCOUNTER — Other Ambulatory Visit: Payer: Self-pay

## 2023-08-10 ENCOUNTER — Other Ambulatory Visit (HOSPITAL_COMMUNITY): Payer: Self-pay

## 2023-08-10 DIAGNOSIS — C8128 Mixed cellularity classical Hodgkin lymphoma, lymph nodes of multiple sites: Secondary | ICD-10-CM

## 2023-08-10 MED FILL — Dexamethasone Sodium Phosphate Inj 100 MG/10ML: INTRAMUSCULAR | Qty: 1 | Status: AC

## 2023-08-11 ENCOUNTER — Other Ambulatory Visit: Payer: Medicaid Other

## 2023-08-11 ENCOUNTER — Other Ambulatory Visit: Payer: Self-pay

## 2023-08-11 ENCOUNTER — Inpatient Hospital Stay: Payer: Medicaid Other | Admitting: Hematology

## 2023-08-11 ENCOUNTER — Ambulatory Visit: Payer: Medicaid Other

## 2023-08-11 ENCOUNTER — Ambulatory Visit: Payer: Medicaid Other | Admitting: Hematology

## 2023-08-11 ENCOUNTER — Inpatient Hospital Stay: Payer: Medicaid Other

## 2023-08-11 VITALS — BP 113/76 | HR 88 | Temp 98.6°F | Resp 20 | Wt 137.0 lb

## 2023-08-11 DIAGNOSIS — Z7189 Other specified counseling: Secondary | ICD-10-CM

## 2023-08-11 DIAGNOSIS — Z5112 Encounter for antineoplastic immunotherapy: Secondary | ICD-10-CM | POA: Diagnosis not present

## 2023-08-11 DIAGNOSIS — C8128 Mixed cellularity classical Hodgkin lymphoma, lymph nodes of multiple sites: Secondary | ICD-10-CM

## 2023-08-11 LAB — CBC WITH DIFFERENTIAL (CANCER CENTER ONLY)
Abs Immature Granulocytes: 0.01 10*3/uL (ref 0.00–0.07)
Basophils Absolute: 0 10*3/uL (ref 0.0–0.1)
Basophils Relative: 1 %
Eosinophils Absolute: 0 10*3/uL (ref 0.0–0.5)
Eosinophils Relative: 1 %
HCT: 27.1 % — ABNORMAL LOW (ref 36.0–46.0)
Hemoglobin: 9.4 g/dL — ABNORMAL LOW (ref 12.0–15.0)
Immature Granulocytes: 1 %
Lymphocytes Relative: 8 %
Lymphs Abs: 0.2 10*3/uL — ABNORMAL LOW (ref 0.7–4.0)
MCH: 31.5 pg (ref 26.0–34.0)
MCHC: 34.7 g/dL (ref 30.0–36.0)
MCV: 90.9 fL (ref 80.0–100.0)
Monocytes Absolute: 0.2 10*3/uL (ref 0.1–1.0)
Monocytes Relative: 10 %
Neutro Abs: 1.6 10*3/uL — ABNORMAL LOW (ref 1.7–7.7)
Neutrophils Relative %: 79 %
Platelet Count: 274 10*3/uL (ref 150–400)
RBC: 2.98 MIL/uL — ABNORMAL LOW (ref 3.87–5.11)
RDW: 14.1 % (ref 11.5–15.5)
WBC Count: 2.1 10*3/uL — ABNORMAL LOW (ref 4.0–10.5)
nRBC: 0 % (ref 0.0–0.2)

## 2023-08-11 LAB — CMP (CANCER CENTER ONLY)
ALT: 16 U/L (ref 0–44)
AST: 22 U/L (ref 15–41)
Albumin: 4.1 g/dL (ref 3.5–5.0)
Alkaline Phosphatase: 111 U/L (ref 38–126)
Anion gap: 5 (ref 5–15)
BUN: 10 mg/dL (ref 6–20)
CO2: 30 mmol/L (ref 22–32)
Calcium: 8.6 mg/dL — ABNORMAL LOW (ref 8.9–10.3)
Chloride: 101 mmol/L (ref 98–111)
Creatinine: 0.87 mg/dL (ref 0.44–1.00)
GFR, Estimated: >60 mL/min (ref >60)
Glucose, Bld: 70 mg/dL (ref 70–99)
Potassium: 4.1 mmol/L (ref 3.5–5.1)
Sodium: 136 mmol/L (ref 135–145)
Total Bilirubin: 0.5 mg/dL (ref 0.3–1.2)
Total Protein: 6.5 g/dL (ref 6.5–8.1)

## 2023-08-11 MED ORDER — VINORELBINE TARTRATE CHEMO INJECTION 50 MG/5ML
20.0000 mg/m2 | Freq: Once | INTRAVENOUS | Status: AC
  Administered 2023-08-11: 34 mg via INTRAVENOUS
  Filled 2023-08-11: qty 3.4

## 2023-08-11 MED ORDER — PALONOSETRON HCL INJECTION 0.25 MG/5ML
0.2500 mg | Freq: Once | INTRAVENOUS | Status: AC
  Administered 2023-08-11: 0.25 mg via INTRAVENOUS
  Filled 2023-08-11: qty 5

## 2023-08-11 MED ORDER — SODIUM CHLORIDE 0.9 % IV SOLN
1000.0000 mg/m2 | Freq: Once | INTRAVENOUS | Status: AC
  Administered 2023-08-11: 1710 mg via INTRAVENOUS
  Filled 2023-08-11: qty 44.97

## 2023-08-11 MED ORDER — SODIUM CHLORIDE 0.9 % IV SOLN: Freq: Once | INTRAVENOUS | Status: AC

## 2023-08-11 MED ORDER — SODIUM CHLORIDE 0.9% FLUSH
10.0000 mL | INTRAVENOUS | Status: DC | PRN
  Administered 2023-08-11: 10 mL

## 2023-08-11 MED ORDER — HEPARIN SOD (PORK) LOCK FLUSH 100 UNIT/ML IV SOLN
500.0000 [IU] | Freq: Once | INTRAVENOUS | Status: AC | PRN
  Administered 2023-08-11: 500 [IU]

## 2023-08-11 MED ORDER — SODIUM CHLORIDE 0.9 % IV SOLN
10.0000 mg | Freq: Once | INTRAVENOUS | Status: AC
  Administered 2023-08-11: 10 mg via INTRAVENOUS
  Filled 2023-08-11: qty 10

## 2023-08-11 NOTE — Patient Instructions (Signed)
 [place] [facility]  Discharge Instructions: Thank you for choosing [facility] to provide your oncology and hematology care.   If you have a lab appointment with the Cancer Center, please go directly to the Cancer Center and check in at the registration area.   Wear comfortable clothing and clothing appropriate for easy access to any Portacath or PICC line.   We strive to give you quality time with your provider. You may need to reschedule your appointment if you arrive late (15 or more minutes).  Arriving late affects you and other patients whose appointments are after yours.  Also, if you miss three or more appointments without notifying the office, you may be dismissed from the clinic at the provider's discretion.      For prescription refill requests, have your pharmacy contact our office and allow 72 hours for refills to be completed.    Today you received the following chemotherapy and/or immunotherapy agents: Navelbine/Gemzar      To help prevent nausea and vomiting after your treatment, we encourage you to take your nausea medication as directed.  BELOW ARE SYMPTOMS THAT SHOULD BE REPORTED IMMEDIATELY: *FEVER GREATER THAN 100.4 F (38 C) OR HIGHER *CHILLS OR SWEATING *NAUSEA AND VOMITING THAT IS NOT CONTROLLED WITH YOUR NAUSEA MEDICATION *UNUSUAL SHORTNESS OF BREATH *UNUSUAL BRUISING OR BLEEDING *URINARY PROBLEMS (pain or burning when urinating, or frequent urination) *BOWEL PROBLEMS (unusual diarrhea, constipation, pain near the anus) TENDERNESS IN MOUTH AND THROAT WITH OR WITHOUT PRESENCE OF ULCERS (sore throat, sores in mouth, or a toothache) UNUSUAL RASH, SWELLING OR PAIN  UNUSUAL VAGINAL DISCHARGE OR ITCHING   Items with * indicate a potential emergency and should be followed up as soon as possible or go to the Emergency Department if any problems should occur.  Please show the CHEMOTHERAPY ALERT CARD or IMMUNOTHERAPY ALERT CARD  at check-in to the Emergency Department and triage nurse.  Should you have questions after your visit or need to cancel or reschedule your appointment, please contact [place] [facility]  Dept: [phone]  and follow the prompts.  Office hours are 8:00 a.m. to 4:30 p.m. Monday - Friday. Please note that voicemails left after 4:00 p.m. may not be returned until the following business day.  We are closed weekends and major holidays. You have access to a nurse at all times for urgent questions. Please call the main number to the clinic Dept: [phone] and follow the prompts.   For any non-urgent questions, you may also contact your provider using MyChart. We now offer e-Visits for anyone 81 and older to request care online for non-urgent symptoms. For details visit [email or web address].   Also download the MyChart app! Go to the app store, search "MyChart", open the app, select [place], and log in with your MyChart username and password.

## 2023-08-11 NOTE — Progress Notes (Signed)
Patient seen by Dr. Kale  Vitals are within treatment parameters.  Labs reviewed: and are within treatment parameters.  Per physician team, patient is ready for treatment and there are NO modifications to the treatment plan.  

## 2023-08-11 NOTE — Progress Notes (Signed)
HEMATOLOGY/ONCOLOGY PHONE VISIT NOTE  Date of service:  08/11/23    Patient Care Team: Pcp, No as PCP - General  Chief complaint: Follow-up for Hodgkin's lymphoma  Diagnosis:   Refractory Mixed cellularity Hodgkin's lymphoma IVBE with extensive lymphadenopathy including right axillary, mediastinal and upper retroperitoneal and now biopsy proven pulmonary involvement. She was noted to have significant constitutional symptoms including significant weight loss, fevers chills and some night sweats.  Current Treatment:  Lost to f/u for about 2 yrs  Previous treatment  5 cycles of AVD (without bleomycin due to lung involvement and DLCO of 37%, active smoker) Multiple avoidable treatment delays due to the patient's noncompliance with follow-up for avoidable reasons. She has been counseled repeatedly that this would increase the likelihood of unfavorable outcome.  2nd line therapy with Bendamustine + Brentuximab s/p 7 cycles.  3rd line therapy with ICE s/p 2 cycles   Pembrolizumab- 4th line. Patient declined HDT/Auto HSCT or consideration of Car-T cell therapy.  INTERVAL HISTORY:  [name] is a 37 y.o. female here for follow-up for her Hodgkins lymphoma. She is here for cycle 2 day 8 of her treatment.   Patient was last seen by PA Thayil on 07/22/2023 and she was doing well overall.   Patient notes she has been doing well overall without any new or severe medical concerns since our last visit. She tolerated cycle 1 of her treatment well without any new or severe toxicities.   She has been to Dr. Alphonsa Gin since our last visit and had a detailed conversation with Dr. Alphonsa Gin. Patient notes that she is still unsure of transplant but is leaning towards to not get an transplant due to social and personal challenges.   She denies any recent infection issues, fever, chills, night sweats, unexpected weight loss, chest pain, abnormal bowel movement, or leg swelling. She notes that her back  pain has improved since our last visit.     REVIEW OF SYSTEMS:   10 Point review of Systems was done is negative except as noted above.   Past Medical History:  Diagnosis Date   ARDS (adult respiratory distress syndrome) (HCC)    Asthma    Hodgkin lymphoma (HCC)    Hypertension     . Past Surgical History:  Procedure Laterality Date   AXILLARY LYMPH NODE BIOPSY Right 03/19/2016   Procedure: AXILLARY LYMPH NODE BIOPSY;  Surgeon: Darnell Level, MD;  Location: [facility];  Service: General;  Laterality: Right;   IR GENERIC HISTORICAL  12/22/2016   IR FLUORO GUIDE PORT INSERTION LEFT 12/22/2016 [facility]   IR GENERIC HISTORICAL  12/22/2016   IR US GUIDE VASC ACCESS LEFT 12/22/2016 [facility]   IR GENERIC HISTORICAL  12/22/2016   IR CV LINE INJECTION 12/22/2016 [facility]   IR GENERIC HISTORICAL  12/22/2016   IR REMOVAL TUN ACCESS W/ PORT W/O FL MOD SED 12/22/2016 [facility]   VIDEO BRONCHOSCOPY Bilateral 11/26/2016   Procedure: VIDEO BRONCHOSCOPY WITH FLUORO;  Surgeon: Oretha Milch, MD;  Location: [facility];  Service: Cardiopulmonary;  Laterality: Bilateral;    . Social History   Tobacco Use   Smoking status: Every Day    Current packs/day: 0.00    Average packs/day: 0.5 packs/day for 15.0 years (7.5 ttl pk-yrs)    Types: Cigarettes    Start date: 03/27/2001    Last attempt to quit: 03/27/2016    Years since quitting: 7.3   Smokeless tobacco: Never  Vaping Use   Vaping status: Never  Used  Substance Use Topics   Alcohol use: Yes    Comment: occasional now   Drug use: Not Currently    ALLERGIES:  is allergic to doxorubicin hcl liposomal.  MEDICATIONS:  Current Outpatient Medications  Medication Sig Dispense Refill   furosemide (LASIX) 20 MG tablet Take 1 tablet (20 mg total) by mouth daily for 5 days. 5 tablet 0   gabapentin (NEURONTIN) 300 MG capsule Take 1 capsule (300 mg total) by mouth at bedtime. 30 capsule 1   HYDROcodone-acetaminophen (NORCO/VICODIN)  5-325 MG tablet Take 1 tablet by mouth every 6 (six) hours as needed. 10 tablet 0   lidocaine (LIDODERM) 5 % Place 1 patch onto the skin daily. Remove & Discard patch within 12 hours or as directed by MD 30 patch 0   lidocaine-prilocaine (EMLA) cream Apply to affected area once 30 g 3   methylPREDNISolone (MEDROL DOSEPAK) 4 MG TBPK tablet Take as directed 21 tablet 0   ondansetron (ZOFRAN) 8 MG tablet Take 1 tablet (8 mg total) by mouth every 8 (eight) hours as needed for nausea or vomiting. 30 tablet 1   prochlorperazine (COMPAZINE) 10 MG tablet Take 1 tablet (10 mg total) by mouth every 6 (six) hours as needed for nausea or vomiting. 30 tablet 1   No current facility-administered medications for this visit.   Facility-Administered Medications Ordered in Other Visits  Medication Dose Route Frequency Provider Last Rate Last Admin   sodium chloride flush (NS) 0.9 % injection 10 mL  10 mL Intracatheter PRN [name], MD        PHYSICAL EXAMINATION: TELEMEDICINE VISIT  ECOG FS:1 - Symptomatic but completely ambulatory  There were no vitals filed for this visit.  Wt Readings from Last 3 Encounters:  08/04/23 139 lb 4 oz (63.2 kg)  07/22/23 138 lb 8 oz (62.8 kg)  07/14/23 139 lb 6.4 oz (63.2 kg)   There is no height or weight on file to calculate BMI.    LABORATORY DATA:   I have reviewed the data as listed     Latest Ref Rng & Units 08/04/2023    8:37 AM 07/22/2023   11:43 AM 07/14/2023    9:17 AM  CBC  WBC 4.0 - 10.5 K/uL 16.5  2.2  4.0   Hemoglobin 12.0 - 15.0 g/dL 9.8  54.0  98.1   Hematocrit 36.0 - 46.0 % 29.3  30.2  33.4   Platelets 150 - 400 K/uL 149  219  339    . CBC    Component Value Date/Time   WBC 16.5 (H) 08/04/2023 0837   WBC 2.3 (L) 07/15/2021 0854   RBC 3.20 (L) 08/04/2023 0837   HGB 9.8 (L) 08/04/2023 0837   HGB 11.7 07/20/2017 1315   HCT 29.3 (L) 08/04/2023 0837   HCT 35.2 07/20/2017 1315   PLT 149 (L) 08/04/2023 0837   PLT 266 07/20/2017  1315   MCV 91.6 08/04/2023 0837   MCV 93.2 07/20/2017 1315   MCH 30.6 08/04/2023 0837   MCHC 33.4 08/04/2023 0837   RDW 14.6 08/04/2023 0837   RDW 14.5 07/20/2017 1315   LYMPHSABS 0.7 08/04/2023 0837   LYMPHSABS 0.3 (L) 07/20/2017 1315   MONOABS 1.1 (H) 08/04/2023 0837   MONOABS 0.5 07/20/2017 1315   EOSABS 0.2 08/04/2023 0837   EOSABS 0.2 07/20/2017 1315   BASOSABS 0.1 08/04/2023 0837   BASOSABS 0.0 07/20/2017 1315   .    Latest Ref Rng & Units 08/04/2023  8:37 AM 07/22/2023   11:43 AM 07/14/2023    9:17 AM  CMP  Glucose 70 - 99 mg/dL 76  66  161   BUN 6 - 20 mg/dL 12  9  10    Creatinine 0.44 - 1.00 mg/dL 0.96  0.45  4.09   Sodium 135 - 145 mmol/L 140  139  137   Potassium 3.5 - 5.1 mmol/L 4.2  4.2  4.2   Chloride 98 - 111 mmol/L 104  104  102   CO2 22 - 32 mmol/L 26  29  28    Calcium 8.9 - 10.3 mg/dL 9.0  8.8  9.1   Total Protein 6.5 - 8.1 g/dL 7.1  6.4  6.8   Total Bilirubin 0.3 - 1.2 mg/dL 0.3  0.3  0.4   Alkaline Phos 38 - 126 U/L 162  106  106   AST 15 - 41 U/L 18  13  16    ALT 0 - 44 U/L 9  8  7       RADIOGRAPHIC STUDIES: I have personally reviewed the radiological images as listed and agreed with the findings in the report. No results found.  ASSESSMENT & PLAN:   37 y.o. African-American female with  #1 Refractory/Progressive Mixed cellularity Hodgkin's lymphoma IV BE with extensive lymphadenopathy including right axillary, mediastinal and upper retroperitoneal and now biopsy proven pulmonary involvement. She was noted to have significant constitutional symptoms including significant weight loss, fevers chills and some night sweats. HIV negative Hepatitis C and hepatitis B serologies negative. Echo with normal ejection fraction. Patient was treated with 5 cycles of AVD (Bleomycin held due to poor DLCO 37% and ongoing smoking). Multiple avoidable treatment delays due to the patient's noncompliance with follow-up for avoidable reasons. She has been counseled  repeatedly that this would increase the likelihood of unfavorable outcome.   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 S/p 6 cycles of 2nd line treatment with Bendamustine/Brentuximab And 1 cycle of Bretuximab alone   -PET/CT scan results from 04/14/2017 were discussed in details. She appears to have some persistent disease in her right lung at Deauville 5. It is difficult to say if this is recurrent or persistent disease since the patient failed to follow-up on multiple scheduled PET/CT scans in the early part and prior to her second line treatment.   Patient was lost to followup for >1 yr   09/26/18 PET/CT revealed Imaging findings compatible with recurrence of disease. 2. Multiple new large areas of hypermetabolic nodularity and airspace consolidation within both lungs which is presumed to represent pulmonary involvement by lymphoma. Deauville criteria 5. 3. New hypermetabolic left supraclavicular, left retroperitoneal, and bilateral pelvic lymph nodes. Deauville criteria 5. 4. Multifocal hypermetabolic osseous lesions.  Deauville criteria 5. 5. Small volume of ascites, new.   12/06/18 PET/CT revealed Generally improved appearance with previously mostly Deauville 5 activity now mostly Deauville 4 activity. 2. Layering of much of the airspace opacity in the lungs, although considerable right perihilar airspace opacity remains. The remaining pulmonary opacities are assess this Deauville 5 on the right and over L4 on the left, and were previously Deauville 5 bilaterally. 3. Mildly reduced size and moderately reduced activity in the abdominopelvic lymph nodes which are now dove L4. 4. The previously seen left supraclavicular lymph node seems to have completely resolved (Deauville 0). 5. The skeletal lesions remain at Deauville 4, although in absolute terms have decreased in SUV compared to previous. 6. No new regions of malignant involvement  compared to prior exam. 7. Other  imaging findings of potential clinical significance: Low-density blood pool suggests anemia. Pectus excavatum. Volume loss in the right hemithorax.  06/13/19 PET/CT revealed "No abnormal hypermetabolism (Deauville 1). 2. Mixed lytic and sclerotic osseous lesions appear more prominent than on 12/06/2018 but do not have associated hypermetabolism, indicative of interval healing." 01/31/2020 CT ABDOMEN PELVIS W CONTRAST (Accession [id number]) and CT CHEST W CONTRAST (Accession [id number]) revealed "1. Small pre pericardial lymph node, not present on the prior study. 2. Stable volume loss in the right chest with shift of mediastinal structures into the right chest. 3. No change in the appearance of multifocal bony sclerosis, without signs of FDG uptake on the previous exam, attention on follow-up."   #2 s/p hypoxic respiratory failure with dense right lung consolidation and left upper lobe consolidation with SVC syndrome. Patient has completed palliative radiation to the right lung mass causing SVC compression.  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   #4 Non compliance with clinic and treatment followup. Missed 2nd dose of bendamustine with C2. Has missed multiple appointment for her PET/CT and missed appointment at [facility] for consideration of Transplant.  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 CBC shows the patient is  leukopenic with WBC at 2.1 K, decreased hemoglobin at 9.4 g/dL, and decreased hematocrit at 27.1%. CMP is stable. -Provided more information about transplant.  -Patient is going to think about transplant option more, talk to her family and friends, and make a decision later.  -Recommend to follow-up with transplant team regarding additional option than the transplant.  -Patient tolerated her first cycle of her treatment well without any new or severe toxicities.  -Patient can proceed with cycle 2 day 8 of her treatment without doxorubicin and without any dose modification.   FOLLOW-UP: Please schedule cycle 3 of chemotherapy as per integrated scheduling Port flush labs and MD visit with cycle 3-day 1   The total time spent in the appointment was 34 minutes* .  All of the patient's questions were answered with apparent satisfaction. The patient knows to call the clinic with any problems, questions or concerns.   Wyvonnia Lora MD MS AAHIVMS [facility] Hematology/Oncology Physician [facility]  .*Total Encounter Time as defined by the Centers for Medicare and Medicaid Services includes, in addition to the face-to-face time of a patient visit (documented in the note above) non-face-to-face time: obtaining and reviewing outside history, ordering and reviewing medications, tests or procedures, care coordination (communications with other health care professionals or caregivers) and documentation in the medical record.   I,Param Shah,acting as a Neurosurgeon for Wyvonnia Lora, MD.,have documented all relevant documentation on the behalf of Wyvonnia Lora, MD,as directed by  Wyvonnia Lora, MD while in the presence of Wyvonnia Lora, MD.   .I have reviewed the above documentation for accuracy and completeness, and I agree with the above. [name] MD

## 2023-08-12 ENCOUNTER — Ambulatory Visit: Payer: Medicaid Other

## 2023-08-12 ENCOUNTER — Ambulatory Visit: Payer: Medicaid Other | Admitting: Hematology

## 2023-08-12 ENCOUNTER — Other Ambulatory Visit: Payer: Medicaid Other

## 2023-08-13 ENCOUNTER — Inpatient Hospital Stay: Payer: Medicaid Other

## 2023-08-13 VITALS — BP 105/77 | HR 96 | Temp 97.8°F

## 2023-08-13 DIAGNOSIS — Z7189 Other specified counseling: Secondary | ICD-10-CM

## 2023-08-13 DIAGNOSIS — C8128 Mixed cellularity classical Hodgkin lymphoma, lymph nodes of multiple sites: Secondary | ICD-10-CM

## 2023-08-13 DIAGNOSIS — Z5112 Encounter for antineoplastic immunotherapy: Secondary | ICD-10-CM | POA: Diagnosis not present

## 2023-08-13 MED ORDER — PEGFILGRASTIM-CBQV 6 MG/0.6ML ~~LOC~~ SOSY
6.0000 mg | PREFILLED_SYRINGE | Freq: Once | SUBCUTANEOUS | Status: AC
  Administered 2023-08-13: 6 mg via SUBCUTANEOUS

## 2023-08-14 ENCOUNTER — Other Ambulatory Visit: Payer: Self-pay

## 2023-08-15 ENCOUNTER — Ambulatory Visit: Payer: Medicaid Other

## 2023-08-17 ENCOUNTER — Encounter: Payer: Self-pay | Admitting: Hematology

## 2023-08-19 ENCOUNTER — Telehealth: Payer: Self-pay | Admitting: Hematology

## 2023-08-22 ENCOUNTER — Emergency Department (HOSPITAL_COMMUNITY): Payer: Medicaid Other

## 2023-08-22 ENCOUNTER — Emergency Department (HOSPITAL_COMMUNITY)
Admission: EM | Admit: 2023-08-22 | Discharge: 2023-08-22 | Discharge status: Against Medical Advice | Payer: Medicaid Other | Attending: Emergency Medicine | Admitting: Emergency Medicine

## 2023-08-22 ENCOUNTER — Other Ambulatory Visit: Payer: Self-pay

## 2023-08-22 ENCOUNTER — Encounter (HOSPITAL_COMMUNITY): Payer: Self-pay

## 2023-08-22 DIAGNOSIS — C8190 Hodgkin lymphoma, unspecified, unspecified site: Secondary | ICD-10-CM | POA: Insufficient documentation

## 2023-08-22 DIAGNOSIS — J9601 Acute respiratory failure with hypoxia: Secondary | ICD-10-CM | POA: Diagnosis not present

## 2023-08-22 DIAGNOSIS — R6511 Systemic inflammatory response syndrome (SIRS) of non-infectious origin with acute organ dysfunction: Secondary | ICD-10-CM | POA: Diagnosis not present

## 2023-08-22 DIAGNOSIS — R0602 Shortness of breath: Secondary | ICD-10-CM | POA: Diagnosis present

## 2023-08-22 DIAGNOSIS — R000 Tachycardia, unspecified: Secondary | ICD-10-CM | POA: Diagnosis not present

## 2023-08-22 DIAGNOSIS — Z8571 Personal history of Hodgkin lymphoma: Secondary | ICD-10-CM

## 2023-08-22 DIAGNOSIS — Z5329 Procedure and treatment not carried out because of patient's decision for other reasons: Secondary | ICD-10-CM | POA: Diagnosis not present

## 2023-08-22 DIAGNOSIS — R6510 Systemic inflammatory response syndrome (SIRS) of non-infectious origin without acute organ dysfunction: Secondary | ICD-10-CM

## 2023-08-22 NOTE — ED Notes (Signed)
Attempted IV with no success. 

## 2023-08-22 NOTE — ED Triage Notes (Signed)
Pt c/o generalized weakness, SOB, HA, pt states "seeing white specks."X4d.

## 2023-08-22 NOTE — ED Notes (Signed)
Refused to get stuck for 2nd blood cultures. Pt states she only wants port to be accessed for labs. Will notify provider.

## 2023-08-22 NOTE — ED Provider Notes (Signed)
[place] EMERGENCY DEPARTMENT AT [facility] Provider Note   CSN: [id number] Arrival date & time: 08/22/23  1711    History  Chief Complaint  Patient presents with   Shortness of Breath   Weakness   Headache    Heidi [name] is a 37 y.o. female hx of moderate pericardial effusion, sepsis, Hodgkin's lymphoma currently undergoing chemotherapy prior DVT, SVC syndrome here for evaluation of chest pain and shortness of breath.  Patient states 3 days ago she developed chest pain, pleuritic in nature.  Her chest pain has improved her she still has moderate shortness of breath.  Feels like she cannot do anything without becoming short of breath.  She has cough which is nonproductive.  She is not currently anticoagulated.  He has a port to her right upper extremity.  States she follows with Dr. Candise Che and [facility] for her active Hodgkins lymphoma.  Last chemotherapy last week, has chemotherapy in 3 days on Thursday  HPI     Home Medications Prior to Admission medications   Medication Sig Start Date End Date Taking? Authorizing Provider  furosemide (LASIX) 20 MG tablet Take 1 tablet (20 mg total) by mouth daily for 5 days. 07/14/23 07/19/23  Namon Cirri E, PA-C  gabapentin (NEURONTIN) 300 MG capsule Take 1 capsule (300 mg total) by mouth at bedtime. Patient not taking: Reported on 08/11/2023 05/27/23   [name], MD  HYDROcodone-acetaminophen (NORCO/VICODIN) 5-325 MG tablet Take 1 tablet by mouth every 6 (six) hours as needed. Patient not taking: Reported on 08/11/2023 07/09/23   Barrie Dunker B, PA-C  lidocaine (LIDODERM) 5 % Place 1 patch onto the skin daily. Remove & Discard patch within 12 hours or as directed by MD Patient not taking: Reported on 08/11/2023 03/29/23   Prosperi, Christian H, PA-C  lidocaine-prilocaine (EMLA) cream Apply to affected area once Patient not taking: Reported on 08/11/2023 06/30/23   [name], MD  methylPREDNISolone (MEDROL  DOSEPAK) 4 MG TBPK tablet Take as directed Patient not taking: Reported on 08/11/2023 04/07/23   Dartha Lodge, PA-C  ondansetron (ZOFRAN) 8 MG tablet Take 1 tablet (8 mg total) by mouth every 8 (eight) hours as needed for nausea or vomiting. 06/30/23   [name], MD  prochlorperazine (COMPAZINE) 10 MG tablet Take 1 tablet (10 mg total) by mouth every 6 (six) hours as needed for nausea or vomiting. 06/30/23   [name], MD      Allergies    Doxorubicin hcl liposomal    Review of Systems   Review of Systems  Constitutional:  Positive for fatigue.  HENT: Negative.    Respiratory:  Positive for shortness of breath.   Cardiovascular:  Positive for chest pain. Negative for palpitations and leg swelling.  Gastrointestinal: Negative.   Genitourinary: Negative.   Musculoskeletal: Negative.   Skin: Negative.   Neurological:  Positive for weakness (generalized). Negative for dizziness, tremors, seizures, syncope, facial asymmetry, speech difficulty, light-headedness, numbness and headaches.  All other systems reviewed and are negative.   Physical Exam Updated Vital Signs BP 118/84   Pulse (!) 101   Temp 98.5 F (36.9 C) (Oral)   Resp (!) 25   Ht 5\' 7"  (1.702 m)   Wt 62.1 kg   SpO2 99%   BMI 21.44 kg/m  Physical Exam Vitals and nursing note reviewed.  Constitutional:      General: She is not in acute distress.    Appearance: She is well-developed. She is  ill-appearing. She is not toxic-appearing or diaphoretic.  HENT:     Head: Normocephalic and atraumatic.  Eyes:     Pupils: Pupils are equal, round, and reactive to light.  Cardiovascular:     Rate and Rhythm: Tachycardia present.     Pulses: Normal pulses.          Radial pulses are 2+ on the right side and 2+ on the left side.       Dorsalis pedis pulses are 2+ on the right side and 2+ on the left side.     Heart sounds: Normal heart sounds.  Pulmonary:     Effort: Pulmonary effort is normal. No respiratory  distress.     Breath sounds: Decreased breath sounds present.     Comments: Decreased breath sounds left upper lobe, right lung.  Speaks without difficulty however has to pause to take a breath.  Hypoxic to 88% on 3 L via nasal cannula Abdominal:     General: Bowel sounds are normal. There is no distension.     Palpations: Abdomen is soft.  Musculoskeletal:        General: Normal range of motion.     Cervical back: Normal range of motion.     Right lower leg: No tenderness. No edema.     Left lower leg: No tenderness. No edema.  Skin:    General: Skin is warm and dry.     Capillary Refill: Capillary refill takes less than 2 seconds.  Neurological:     General: No focal deficit present.     Mental Status: She is alert.  Psychiatric:        Mood and Affect: Mood normal.     ED Results / Procedures / Treatments   Labs (all labs ordered are listed, but only abnormal results are displayed) Labs Reviewed  RESP PANEL BY RT-PCR (RSV, FLU A&B, COVID)  RVPGX2  CULTURE, BLOOD (ROUTINE X 2)  CULTURE, BLOOD (ROUTINE X 2)  COMPREHENSIVE METABOLIC PANEL  CBC  HCG, SERUM, QUALITATIVE  BRAIN NATRIURETIC PEPTIDE  I-STAT CG4 LACTIC ACID, ED  I-STAT CG4 LACTIC ACID, ED    EKG EKG Interpretation Date/Time:  Monday August 22 2023 17:21:12 EDT Ventricular Rate:  108 PR Interval:  140 QRS Duration:  70 QT Interval:  352 QTC Calculation: 471 R Axis:   3  Text Interpretation: Sinus tachycardia Otherwise normal ECG When compared with ECG of 22-Jun-2021 09:21, PREVIOUS ECG IS PRESENT Nonspecific t wave inversion lead 3 Confirmed by Alvester Chou [phone]) on 08/22/2023 6:13:19 PM  Radiology DG Chest Port 1 View  Result Date: 08/22/2023 CLINICAL DATA:  Short of breath, weakness, headache, history of Hodgkin lymphoma EXAM: PORTABLE CHEST 1 VIEW COMPARISON:  06/15/2023, 12/09/2021 FINDINGS: Single frontal view of the chest demonstrates stable left chest wall port via internal jugular approach.  Cardiac silhouette is enlarged. Chronic areas of scarring are seen within the right chest, with associated volume loss. Patchy left upper lobe consolidation seen on recent PET scan again noted and unchanged. No effusion or pneumothorax. No acute bony abnormalities. IMPRESSION: 1. Continued patchy left upper lobe airspace disease, corresponding to the hypermetabolic consolidation on recent PET scan and concerning for recurrent lymphoma. 2. Chronic scarring within the right chest with associated volume loss. 3. Stable enlarged cardiac silhouette. Electronically Signed   By: Sharlet Salina M.D.   On: 08/22/2023 18:37    Procedures Procedures    Medications Ordered in ED Medications - No data to display  ED Course/  Medical Decision Making/ A&P   37 year old history of Hodgkin's lymphoma currently undergoing treatment, prior DVT, sepsis, pericardial effusion here for evaluation of feeling unwell.  3 days of chest pain.  Chest pain has improved however now having shortness of breath.  On arrival patient is tachycardic, tachypneic and hypoxic.  She has no clinical evidence of DVT on exam however is requiring 3 L via nasal cannula.  She has decrease lung sounds in left upper lobe and right lobe.  Abdomen soft, nontender.  Will start on labs for sepsis, I did review her recent echocardiogram a month ago which showed moderate pericardial effusion.  Concern for worsening of same versus PE versus sepsis.  Patient refusing IV access by nursing staff as she only wants her port accessed.  Nursing was unable to do so, subsequently IV team order placed.  IV team at bedside.  Patient refusing to let them assess her.   Nursing staff has come to get me stating patient states she is leaving the emergency department.  Immediately went to bedside.  Patient is refusing to stay for further workup.  I had long discussion with patient with nursing staff in room as well.  Patient states she is leaving.  We discussed I have  serious concern for liver length and condition based off her vital signs however patient states she is leaving the emergency department.  She is chosen to leave AGAINST MEDICAL ADVICE.  She has capacity make decision making capability.  I encouraged her to please return if she changes her mind or feels worse.  We discussed the nature and purpose, risks and benefits, as well as, the alternatives of treatment. Time was given to allow the opportunity to ask questions and consider their options, and after the discussion, the patient decided to refuse the offerred treatment. The patient was informed that refusal could lead to, but was not limited to, death, permanent disability, or severe pain. If present, I asked the relatives or significant others to dissuade them without success. Prior to refusing, I determined that the patient had the capacity to make their decision and understood the consequences of that decision. After refusal, I made every reasonable opportunity to treat them to the best of my ability.  The patient was notified that they may return to the emergency department at any time for further treatment.                                   Medical Decision Making Amount and/or Complexity of Data Reviewed External Data Reviewed: labs, radiology, ECG and notes. Labs: ordered. Decision-making details documented in ED Course. Radiology: ordered and independent interpretation performed. Decision-making details documented in ED Course. ECG/medicine tests: ordered and independent interpretation performed. Decision-making details documented in ED Course.  Risk OTC drugs. Prescription drug management. Parenteral controlled substances. Decision regarding hospitalization. Diagnosis or treatment significantly limited by social determinants of health.          Final Clinical Impression(s) / ED Diagnoses Final diagnoses:  Acute respiratory failure with hypoxia (HCC)  SIRS (systemic  inflammatory response syndrome) (HCC)  Left against medical advice  History of Hodgkin's lymphoma    Rx / DC Orders ED Discharge Orders     None         Broxton Broady A, PA-C 08/22/23 2114    Terald Sleeper, MD 08/22/23 2224

## 2023-08-22 NOTE — Progress Notes (Signed)
Arrived to room to access port per IV team consult.  Upon arrival pt stated she did not need the port accessed because she was leaving.

## 2023-08-22 NOTE — ED Notes (Signed)
Placed pt on 3L 02 per [place] 

## 2023-08-22 NOTE — ED Notes (Signed)
Pt is wanting to leave. Provider is talking to pt now and explaining to pt risk vs benefits of staying. Pt wants to go home. AMA papers signed. Cleared by provider to leave.

## 2023-08-24 MED FILL — Dexamethasone Sodium Phosphate Inj 100 MG/10ML: INTRAMUSCULAR | Qty: 1 | Status: AC

## 2023-08-25 ENCOUNTER — Inpatient Hospital Stay: Payer: Medicaid Other

## 2023-08-25 ENCOUNTER — Other Ambulatory Visit: Payer: Self-pay

## 2023-08-25 VITALS — BP 127/89 | HR 94 | Temp 98.5°F | Resp 19 | Wt 141.0 lb

## 2023-08-25 DIAGNOSIS — Z5112 Encounter for antineoplastic immunotherapy: Secondary | ICD-10-CM | POA: Diagnosis not present

## 2023-08-25 DIAGNOSIS — C8198 Hodgkin lymphoma, unspecified, lymph nodes of multiple sites: Secondary | ICD-10-CM

## 2023-08-25 DIAGNOSIS — Z7189 Other specified counseling: Secondary | ICD-10-CM

## 2023-08-25 DIAGNOSIS — C8128 Mixed cellularity classical Hodgkin lymphoma, lymph nodes of multiple sites: Secondary | ICD-10-CM

## 2023-08-25 DIAGNOSIS — Z95828 Presence of other vascular implants and grafts: Secondary | ICD-10-CM

## 2023-08-25 LAB — CBC WITH DIFFERENTIAL (CANCER CENTER ONLY)
Abs Immature Granulocytes: 3.15 10*3/uL — ABNORMAL HIGH (ref 0.00–0.07)
Basophils Absolute: 0.1 10*3/uL (ref 0.0–0.1)
Basophils Relative: 0 %
Eosinophils Absolute: 0.2 10*3/uL (ref 0.0–0.5)
Eosinophils Relative: 1 %
HCT: 21.7 % — ABNORMAL LOW (ref 36.0–46.0)
Hemoglobin: 7.2 g/dL — ABNORMAL LOW (ref 12.0–15.0)
Immature Granulocytes: 15 %
Lymphocytes Relative: 2 %
Lymphs Abs: 0.4 10*3/uL — ABNORMAL LOW (ref 0.7–4.0)
MCH: 31.2 pg (ref 26.0–34.0)
MCHC: 33.2 g/dL (ref 30.0–36.0)
MCV: 93.9 fL (ref 80.0–100.0)
Monocytes Absolute: 1.1 10*3/uL — ABNORMAL HIGH (ref 0.1–1.0)
Monocytes Relative: 5 %
Neutro Abs: 15.8 10*3/uL — ABNORMAL HIGH (ref 1.7–7.7)
Neutrophils Relative %: 77 %
Platelet Count: 235 10*3/uL (ref 150–400)
RBC: 2.31 MIL/uL — ABNORMAL LOW (ref 3.87–5.11)
RDW: 17.2 % — ABNORMAL HIGH (ref 11.5–15.5)
WBC Count: 20.6 10*3/uL — ABNORMAL HIGH (ref 4.0–10.5)
nRBC: 0.6 % — ABNORMAL HIGH (ref 0.0–0.2)

## 2023-08-25 LAB — CMP (CANCER CENTER ONLY)
ALT: 25 U/L (ref 0–44)
AST: 49 U/L — ABNORMAL HIGH (ref 15–41)
Albumin: 4 g/dL (ref 3.5–5.0)
Alkaline Phosphatase: 138 U/L — ABNORMAL HIGH (ref 38–126)
Anion gap: 7 (ref 5–15)
BUN: 15 mg/dL (ref 6–20)
CO2: 26 mmol/L (ref 22–32)
Calcium: 8.8 mg/dL — ABNORMAL LOW (ref 8.9–10.3)
Chloride: 104 mmol/L (ref 98–111)
Creatinine: 0.84 mg/dL (ref 0.44–1.00)
GFR, Estimated: >60 mL/min (ref >60)
Glucose, Bld: 100 mg/dL — ABNORMAL HIGH (ref 70–99)
Potassium: 4.1 mmol/L (ref 3.5–5.1)
Sodium: 137 mmol/L (ref 135–145)
Total Bilirubin: 0.6 mg/dL (ref 0.3–1.2)
Total Protein: 6.4 g/dL — ABNORMAL LOW (ref 6.5–8.1)

## 2023-08-25 LAB — PREPARE RBC (CROSSMATCH)

## 2023-08-25 MED ORDER — SODIUM CHLORIDE 0.9% FLUSH
10.0000 mL | INTRAVENOUS | Status: DC | PRN
  Administered 2023-08-25: 10 mL

## 2023-08-25 MED ORDER — HEPARIN SOD (PORK) LOCK FLUSH 100 UNIT/ML IV SOLN
500.0000 [IU] | Freq: Once | INTRAVENOUS | Status: AC | PRN
  Administered 2023-08-25: 500 [IU]

## 2023-08-25 MED ORDER — SODIUM CHLORIDE 0.9 % IV SOLN: Freq: Once | INTRAVENOUS | Status: AC

## 2023-08-25 MED ORDER — PALONOSETRON HCL INJECTION 0.25 MG/5ML
0.2500 mg | Freq: Once | INTRAVENOUS | Status: AC
  Administered 2023-08-25: 0.25 mg via INTRAVENOUS
  Filled 2023-08-25: qty 5

## 2023-08-25 MED ORDER — SODIUM CHLORIDE 0.9 % IV SOLN
10.0000 mg | Freq: Once | INTRAVENOUS | Status: AC
  Administered 2023-08-25: 10 mg via INTRAVENOUS
  Filled 2023-08-25: qty 10

## 2023-08-25 MED ORDER — SODIUM CHLORIDE 0.9 % IV SOLN
1000.0000 mg/m2 | Freq: Once | INTRAVENOUS | Status: AC
  Administered 2023-08-25: 1710 mg via INTRAVENOUS
  Filled 2023-08-25: qty 44.97

## 2023-08-25 MED ORDER — SODIUM CHLORIDE 0.9 % IV SOLN
200.0000 mg | Freq: Once | INTRAVENOUS | Status: AC
  Administered 2023-08-25: 200 mg via INTRAVENOUS
  Filled 2023-08-25: qty 200

## 2023-08-25 MED ORDER — VINORELBINE TARTRATE CHEMO INJECTION 50 MG/5ML
20.0000 mg/m2 | Freq: Once | INTRAVENOUS | Status: AC
  Administered 2023-08-25: 34 mg via INTRAVENOUS
  Filled 2023-08-25: qty 3.4

## 2023-08-25 NOTE — Addendum Note (Signed)
Addended by: Caesar Chestnut on: 08/25/2023 06:16 PM   Modules accepted: Orders

## 2023-08-25 NOTE — Patient Instructions (Signed)
[place] [facility] AT [facility]  Discharge Instructions: Thank you for choosing [facility] to provide your oncology and hematology care.   If you have a lab appointment with the Cancer Center, please go directly to the Cancer Center and check in at the registration area.   Wear comfortable clothing and clothing appropriate for easy access to any Portacath or PICC line.   We strive to give you quality time with your provider. You may need to reschedule your appointment if you arrive late (15 or more minutes).  Arriving late affects you and other patients whose appointments are after yours.  Also, if you miss three or more appointments without notifying the office, you may be dismissed from the clinic at the provider's discretion.      For prescription refill requests, have your pharmacy contact our office and allow 72 hours for refills to be completed.    Today you received the following chemotherapy and/or immunotherapy agents: Pembrolizumab (Keytruda), Vinorelibine, and Gemcitabine (Gemzar).   To help prevent nausea and vomiting after your treatment, we encourage you to take your nausea medication as directed.  BELOW ARE SYMPTOMS THAT SHOULD BE REPORTED IMMEDIATELY: *FEVER GREATER THAN 100.4 F (38 C) OR HIGHER *CHILLS OR SWEATING *NAUSEA AND VOMITING THAT IS NOT CONTROLLED WITH YOUR NAUSEA MEDICATION *UNUSUAL SHORTNESS OF BREATH *UNUSUAL BRUISING OR BLEEDING *URINARY PROBLEMS (pain or burning when urinating, or frequent urination) *BOWEL PROBLEMS (unusual diarrhea, constipation, pain near the anus) TENDERNESS IN MOUTH AND THROAT WITH OR WITHOUT PRESENCE OF ULCERS (sore throat, sores in mouth, or a toothache) UNUSUAL RASH, SWELLING OR PAIN  UNUSUAL VAGINAL DISCHARGE OR ITCHING   Items with * indicate a potential emergency and should be followed up as soon as possible or go to the Emergency Department if any problems should occur.  Please show the  CHEMOTHERAPY ALERT CARD or IMMUNOTHERAPY ALERT CARD at check-in to the Emergency Department and triage nurse.  Should you have questions after your visit or need to cancel or reschedule your appointment, please contact [place] [facility] AT [facility]  Dept: [phone]  and follow the prompts.  Office hours are 8:00 a.m. to 4:30 p.m. Monday - Friday. Please note that voicemails left after 4:00 p.m. may not be returned until the following business day.  We are closed weekends and major holidays. You have access to a nurse at all times for urgent questions. Please call the main number to the clinic Dept: [phone] and follow the prompts.   For any non-urgent questions, you may also contact your provider using MyChart. We now offer e-Visits for anyone 59 and older to request care online for non-urgent symptoms. For details visit [email or web address].   Also download the MyChart app! Go to the app store, search "MyChart", open the app, select [facility], and log in with your MyChart username and password.

## 2023-08-25 NOTE — Progress Notes (Signed)
Dr Candise Che aware Hgb 7.2. Pt ok for tx today. Pt to get 1 unit of blood tomorrow.

## 2023-08-25 NOTE — Patient Instructions (Signed)

## 2023-08-26 ENCOUNTER — Inpatient Hospital Stay: Payer: Medicaid Other

## 2023-08-26 DIAGNOSIS — Z5112 Encounter for antineoplastic immunotherapy: Secondary | ICD-10-CM | POA: Diagnosis not present

## 2023-08-26 DIAGNOSIS — C8198 Hodgkin lymphoma, unspecified, lymph nodes of multiple sites: Secondary | ICD-10-CM

## 2023-08-26 LAB — T4: T4, Total: 6.6 ug/dL (ref 4.5–12.0)

## 2023-08-26 LAB — TSH: TSH: 1.587 u[IU]/mL (ref 0.350–4.500)

## 2023-08-26 MED ORDER — SODIUM CHLORIDE 0.9% IV SOLUTION
250.0000 mL | Freq: Once | INTRAVENOUS | Status: AC
  Administered 2023-08-26: 250 mL via INTRAVENOUS

## 2023-08-26 MED ORDER — METHYLPREDNISOLONE SODIUM SUCC 40 MG IJ SOLR
40.0000 mg | Freq: Once | INTRAMUSCULAR | Status: AC
  Administered 2023-08-26: 40 mg via INTRAVENOUS
  Filled 2023-08-26: qty 1

## 2023-08-26 MED ORDER — HEPARIN SOD (PORK) LOCK FLUSH 100 UNIT/ML IV SOLN
500.0000 [IU] | Freq: Every day | INTRAVENOUS | Status: AC | PRN
  Administered 2023-08-26: 500 [IU]

## 2023-08-26 MED ORDER — SODIUM CHLORIDE 0.9% FLUSH
10.0000 mL | INTRAVENOUS | Status: AC | PRN
  Administered 2023-08-26: 10 mL

## 2023-08-26 MED ORDER — ACETAMINOPHEN 325 MG PO TABS
650.0000 mg | ORAL_TABLET | Freq: Once | ORAL | Status: AC
  Administered 2023-08-26: 650 mg via ORAL
  Filled 2023-08-26: qty 2

## 2023-08-26 NOTE — Patient Instructions (Signed)

## 2023-08-29 LAB — TYPE AND SCREEN
ABO/RH(D): A POS
Antibody Screen: NEGATIVE
Unit division: 0

## 2023-08-29 LAB — BPAM RBC
Blood Product Expiration Date: 202409162359
ISSUE DATE / TIME: 202408301147
Unit Type and Rh: 6200

## 2023-08-30 MED FILL — Dexamethasone Sodium Phosphate Inj 100 MG/10ML: INTRAMUSCULAR | Qty: 1 | Status: AC

## 2023-08-31 ENCOUNTER — Encounter: Payer: Self-pay | Admitting: Hematology

## 2023-08-31 ENCOUNTER — Inpatient Hospital Stay (HOSPITAL_BASED_OUTPATIENT_CLINIC_OR_DEPARTMENT_OTHER): Payer: Medicaid Other | Admitting: Physician Assistant

## 2023-08-31 ENCOUNTER — Inpatient Hospital Stay: Payer: Medicaid Other | Attending: Hematology

## 2023-08-31 ENCOUNTER — Inpatient Hospital Stay: Payer: Medicaid Other

## 2023-08-31 ENCOUNTER — Other Ambulatory Visit: Payer: Self-pay

## 2023-08-31 VITALS — BP 132/74 | HR 83 | Temp 98.5°F | Resp 16 | Wt 141.4 lb

## 2023-08-31 DIAGNOSIS — D649 Anemia, unspecified: Secondary | ICD-10-CM | POA: Diagnosis not present

## 2023-08-31 DIAGNOSIS — Z79633 Long term (current) use of mitotic inhibitor: Secondary | ICD-10-CM | POA: Insufficient documentation

## 2023-08-31 DIAGNOSIS — Z7189 Other specified counseling: Secondary | ICD-10-CM

## 2023-08-31 DIAGNOSIS — Z79631 Long term (current) use of antimetabolite agent: Secondary | ICD-10-CM | POA: Insufficient documentation

## 2023-08-31 DIAGNOSIS — C8128 Mixed cellularity classical Hodgkin lymphoma, lymph nodes of multiple sites: Secondary | ICD-10-CM

## 2023-08-31 DIAGNOSIS — Z95828 Presence of other vascular implants and grafts: Secondary | ICD-10-CM

## 2023-08-31 DIAGNOSIS — F17210 Nicotine dependence, cigarettes, uncomplicated: Secondary | ICD-10-CM | POA: Diagnosis not present

## 2023-08-31 DIAGNOSIS — Z5982 Transportation insecurity: Secondary | ICD-10-CM | POA: Diagnosis not present

## 2023-08-31 DIAGNOSIS — Z91199 Patient's noncompliance with other medical treatment and regimen due to unspecified reason: Secondary | ICD-10-CM | POA: Insufficient documentation

## 2023-08-31 DIAGNOSIS — Z79899 Other long term (current) drug therapy: Secondary | ICD-10-CM | POA: Diagnosis not present

## 2023-08-31 DIAGNOSIS — I3139 Other pericardial effusion (noninflammatory): Secondary | ICD-10-CM | POA: Insufficient documentation

## 2023-08-31 DIAGNOSIS — Z515 Encounter for palliative care: Secondary | ICD-10-CM | POA: Diagnosis not present

## 2023-08-31 DIAGNOSIS — R0602 Shortness of breath: Secondary | ICD-10-CM | POA: Diagnosis not present

## 2023-08-31 DIAGNOSIS — R0902 Hypoxemia: Secondary | ICD-10-CM | POA: Diagnosis not present

## 2023-08-31 DIAGNOSIS — Z5111 Encounter for antineoplastic chemotherapy: Secondary | ICD-10-CM | POA: Diagnosis present

## 2023-08-31 LAB — CBC WITH DIFFERENTIAL (CANCER CENTER ONLY)
Abs Immature Granulocytes: 0.02 10*3/uL (ref 0.00–0.07)
Basophils Absolute: 0 10*3/uL (ref 0.0–0.1)
Basophils Relative: 1 %
Eosinophils Absolute: 0 10*3/uL (ref 0.0–0.5)
Eosinophils Relative: 1 %
HCT: 23.1 % — ABNORMAL LOW (ref 36.0–46.0)
Hemoglobin: 7.7 g/dL — ABNORMAL LOW (ref 12.0–15.0)
Immature Granulocytes: 1 %
Lymphocytes Relative: 2 %
Lymphs Abs: 0.1 10*3/uL — ABNORMAL LOW (ref 0.7–4.0)
MCH: 30.1 pg (ref 26.0–34.0)
MCHC: 33.3 g/dL (ref 30.0–36.0)
MCV: 90.2 fL (ref 80.0–100.0)
Monocytes Absolute: 0.1 10*3/uL (ref 0.1–1.0)
Monocytes Relative: 2 %
Neutro Abs: 4 10*3/uL (ref 1.7–7.7)
Neutrophils Relative %: 93 %
Platelet Count: 333 10*3/uL (ref 150–400)
RBC: 2.56 MIL/uL — ABNORMAL LOW (ref 3.87–5.11)
RDW: 19.6 % — ABNORMAL HIGH (ref 11.5–15.5)
WBC Count: 4.3 10*3/uL (ref 4.0–10.5)
nRBC: 0 % (ref 0.0–0.2)

## 2023-08-31 LAB — CMP (CANCER CENTER ONLY)
ALT: 14 U/L (ref 0–44)
AST: 15 U/L (ref 15–41)
Albumin: 3.8 g/dL (ref 3.5–5.0)
Alkaline Phosphatase: 90 U/L (ref 38–126)
Anion gap: 6 (ref 5–15)
BUN: 17 mg/dL (ref 6–20)
CO2: 30 mmol/L (ref 22–32)
Calcium: 8.7 mg/dL — ABNORMAL LOW (ref 8.9–10.3)
Chloride: 103 mmol/L (ref 98–111)
Creatinine: 0.9 mg/dL (ref 0.44–1.00)
GFR, Estimated: >60 mL/min (ref >60)
Glucose, Bld: 80 mg/dL (ref 70–99)
Potassium: 4.4 mmol/L (ref 3.5–5.1)
Sodium: 139 mmol/L (ref 135–145)
Total Bilirubin: 0.5 mg/dL (ref 0.3–1.2)
Total Protein: 5.9 g/dL — ABNORMAL LOW (ref 6.5–8.1)

## 2023-08-31 LAB — IRON AND IRON BINDING CAPACITY (CC-WL,HP ONLY)
Iron: 114 ug/dL (ref 28–170)
Saturation Ratios: 33 % — ABNORMAL HIGH (ref 10.4–31.8)
TIBC: 342 ug/dL (ref 250–450)
UIBC: 228 ug/dL (ref 148–442)

## 2023-08-31 LAB — FERRITIN: Ferritin: 341 ng/mL — ABNORMAL HIGH (ref 11–307)

## 2023-08-31 LAB — PREPARE RBC (CROSSMATCH)

## 2023-08-31 MED ORDER — SODIUM CHLORIDE 0.9% IV SOLUTION
250.0000 mL | Freq: Once | INTRAVENOUS | Status: AC
  Administered 2023-08-31: 250 mL via INTRAVENOUS

## 2023-08-31 MED ORDER — SODIUM CHLORIDE 0.9% FLUSH: 3.0000 mL | INTRAVENOUS | Status: DC | PRN

## 2023-08-31 MED ORDER — ACETAMINOPHEN 325 MG PO TABS
650.0000 mg | ORAL_TABLET | Freq: Once | ORAL | Status: AC
  Administered 2023-08-31: 650 mg via ORAL
  Filled 2023-08-31: qty 2

## 2023-08-31 MED ORDER — HEPARIN SOD (PORK) LOCK FLUSH 100 UNIT/ML IV SOLN: 250.0000 [IU] | INTRAVENOUS | Status: DC | PRN

## 2023-08-31 MED ORDER — SODIUM CHLORIDE 0.9% FLUSH
10.0000 mL | INTRAVENOUS | Status: DC | PRN
  Administered 2023-08-31: 10 mL

## 2023-08-31 MED ORDER — DIPHENHYDRAMINE HCL 25 MG PO CAPS
25.0000 mg | ORAL_CAPSULE | Freq: Once | ORAL | Status: AC
  Administered 2023-08-31: 25 mg via ORAL
  Filled 2023-08-31: qty 1

## 2023-08-31 NOTE — Progress Notes (Signed)
Per Dr. Candise Che, okay for patient's blood transfusion to run over 1.5 hours as long as patient is tolerating.

## 2023-08-31 NOTE — Progress Notes (Signed)
HEMATOLOGY/ONCOLOGY PHONE VISIT NOTE  Date of service:  07/22/23   Patient Care Team: Pcp, No as PCP - General   Diagnosis: Refractory Mixed cellularity Hodgkin's lymphoma IVBE with extensive lymphadenopathy including right axillary, mediastinal and upper retroperitoneal and now biopsy proven pulmonary involvement.   PRIOR TREATMENT: 5 cycles of AVD (without bleomycin due to lung involvement and DLCO of 37%, active smoker). Multiple avoidable treatment delays due to the patient's noncompliance with follow-up for avoidable reasons. She has been counseled repeatedly that this would increase the likelihood of unfavorable outcome.  2nd line therapy with Bendamustine + Brentuximab s/p 7 cycles. 3rd line therapy with ICE s/p 2 cycles  4th line therapy with pembrolizumab  CURRENT TREATMENT: Doxorubicin/Gemcitabine/Pembrolizumab/Vinorelbine, started on 07/14/2023. Developed infusion reaction with doxorubicin with Cycle 1 so discontinued.   INTERVAL HISTORY:  Heidi [name] is a 37 y.o. female here for follow-up for her Hodgkins lymphoma prior to Cycle 3, Day 8 of chemotherapy. She is unaccompanied for this visit.   Heidi [name] reports she did develop shortness of breath last week which resulted in a ED visit. Her Hgb dropped to 7.2 and she received 1 unit of PRBC on 08/26/2023. She reports her shortness of breath has improved. She is otherwise tolerating her treatment. She denies nausea, vomiting or bowel habit changes.She denies easy bruising or signs of bleeding. She denies fevers, chills, sweats, chest pain or cough. She has no other complaints.   REVIEW OF SYSTEMS:   10 Point review of Systems was done is negative except as noted above.   Past Medical History:  Diagnosis Date   ARDS (adult respiratory distress syndrome) (HCC)    Asthma    Hodgkin lymphoma (HCC)    Hypertension     . Past Surgical History:  Procedure Laterality Date   AXILLARY LYMPH NODE BIOPSY Right 03/19/2016    Procedure: AXILLARY LYMPH NODE BIOPSY;  Surgeon: Darnell Level, MD;  Location: [facility];  Service: General;  Laterality: Right;   IR GENERIC HISTORICAL  12/22/2016   IR FLUORO GUIDE PORT INSERTION LEFT 12/22/2016 [facility]   IR GENERIC HISTORICAL  12/22/2016   IR US GUIDE VASC ACCESS LEFT 12/22/2016 [facility]   IR GENERIC HISTORICAL  12/22/2016   IR CV LINE INJECTION 12/22/2016 [facility]   IR GENERIC HISTORICAL  12/22/2016   IR REMOVAL TUN ACCESS W/ PORT W/O FL MOD SED 12/22/2016 [facility]   VIDEO BRONCHOSCOPY Bilateral 11/26/2016   Procedure: VIDEO BRONCHOSCOPY WITH FLUORO;  Surgeon: Oretha Milch, MD;  Location: [facility];  Service: Cardiopulmonary;  Laterality: Bilateral;    . Social History   Tobacco Use   Smoking status: Some Days    Current packs/day: 0.00    Average packs/day: 0.5 packs/day for 15.0 years (7.5 ttl pk-yrs)    Types: Cigarettes    Start date: 03/27/2001    Last attempt to quit: 03/27/2016    Years since quitting: 7.4   Smokeless tobacco: Never  Vaping Use   Vaping status: Never Used  Substance Use Topics   Alcohol use: Yes    Comment: occasional now   Drug use: Not Currently    ALLERGIES:  is allergic to doxorubicin hcl liposomal.  MEDICATIONS:  Current Outpatient Medications  Medication Sig Dispense Refill   ondansetron (ZOFRAN) 8 MG tablet Take 1 tablet (8 mg total) by mouth every 8 (eight) hours as needed for nausea or vomiting. 30 tablet 1   prochlorperazine (COMPAZINE) 10 MG tablet Take 1 tablet (10 mg  total) by mouth every 6 (six) hours as needed for nausea or vomiting. 30 tablet 1   furosemide (LASIX) 20 MG tablet Take 1 tablet (20 mg total) by mouth daily for 5 days. 5 tablet 0   gabapentin (NEURONTIN) 300 MG capsule Take 1 capsule (300 mg total) by mouth at bedtime. (Patient not taking: Reported on 08/11/2023) 30 capsule 1   HYDROcodone-acetaminophen (NORCO/VICODIN) 5-325 MG tablet Take 1 tablet by mouth every 6 (six) hours as needed.  (Patient not taking: Reported on 08/11/2023) 10 tablet 0   lidocaine (LIDODERM) 5 % Place 1 patch onto the skin daily. Remove & Discard patch within 12 hours or as directed by MD (Patient not taking: Reported on 08/11/2023) 30 patch 0   lidocaine-prilocaine (EMLA) cream Apply to affected area once (Patient not taking: Reported on 08/11/2023) 30 g 3   methylPREDNISolone (MEDROL DOSEPAK) 4 MG TBPK tablet Take as directed (Patient not taking: Reported on 08/11/2023) 21 tablet 0   No current facility-administered medications for this visit.   Facility-Administered Medications Ordered in Other Visits  Medication Dose Route Frequency Provider Last Rate Last Admin   sodium chloride flush (NS) 0.9 % injection 10 mL  10 mL Intracatheter PRN [name], MD        PHYSICAL EXAMINATION: TELEMEDICINE VISIT  ECOG FS:1 - Symptomatic but completely ambulatory  Vitals:   08/31/23 1131  BP: 132/74  Pulse: 83  Resp: 16  Temp: 98.5 F (36.9 C)  SpO2: 100%    Wt Readings from Last 3 Encounters:  08/31/23 141 lb 6.4 oz (64.1 kg)  08/25/23 141 lb (64 kg)  08/22/23 136 lb 14.5 oz (62.1 kg)   Constitutional: Oriented to person, place, and time and well-developed, well-nourished, and in no distress.  HENT:  Head: Normocephalic and atraumatic.   Cardiovascular: Normal rate, regular rhythm, normal heart sounds Pulmonary/Chest: Effort normal and breath sounds normal. No respiratory distress. No wheezes. No rales.  Musculoskeletal: Normal range of motion. Exhibits no edema.  Neurological: Alert and oriented to person, place, and time. Exhibits normal muscle tone. Gait normal. Coordination normal.  Skin: Skin is warm and dry. No rash noted. Not diaphoretic. No erythema. No pallor.  Psychiatric: Mood, memory and judgment normal.   LABORATORY DATA:   I have reviewed the data as listed     Latest Ref Rng & Units 08/31/2023   11:08 AM 08/25/2023    2:13 PM 08/11/2023   10:08 AM  CBC  WBC 4.0 - 10.5  K/uL 4.3  20.6  2.1   Hemoglobin 12.0 - 15.0 g/dL 7.7  7.2  9.4   Hematocrit 36.0 - 46.0 % 23.1  21.7  27.1   Platelets 150 - 400 K/uL 333  235  274    . CBC    Component Value Date/Time   WBC 4.3 08/31/2023 1108   WBC 2.3 (L) 07/15/2021 0854   RBC 2.56 (L) 08/31/2023 1108   HGB 7.7 (L) 08/31/2023 1108   HGB 11.7 07/20/2017 1315   HCT 23.1 (L) 08/31/2023 1108   HCT 35.2 07/20/2017 1315   PLT 333 08/31/2023 1108   PLT 266 07/20/2017 1315   MCV 90.2 08/31/2023 1108   MCV 93.2 07/20/2017 1315   MCH 30.1 08/31/2023 1108   MCHC 33.3 08/31/2023 1108   RDW 19.6 (H) 08/31/2023 1108   RDW 14.5 07/20/2017 1315   LYMPHSABS 0.1 (L) 08/31/2023 1108   LYMPHSABS 0.3 (L) 07/20/2017 1315   MONOABS 0.1 08/31/2023 1108  MONOABS 0.5 07/20/2017 1315   EOSABS 0.0 08/31/2023 1108   EOSABS 0.2 07/20/2017 1315   BASOSABS 0.0 08/31/2023 1108   BASOSABS 0.0 07/20/2017 1315   .    Latest Ref Rng & Units 08/25/2023    2:13 PM 08/11/2023   10:08 AM 08/04/2023    8:37 AM  CMP  Glucose 70 - 99 mg/dL 295  70  76   BUN 6 - 20 mg/dL 15  10  12    Creatinine 0.44 - 1.00 mg/dL 2.84  1.32  4.40   Sodium 135 - 145 mmol/L 137  136  140   Potassium 3.5 - 5.1 mmol/L 4.1  4.1  4.2   Chloride 98 - 111 mmol/L 104  101  104   CO2 22 - 32 mmol/L 26  30  26    Calcium 8.9 - 10.3 mg/dL 8.8  8.6  9.0   Total Protein 6.5 - 8.1 g/dL 6.4  6.5  7.1   Total Bilirubin 0.3 - 1.2 mg/dL 0.6  0.5  0.3   Alkaline Phos 38 - 126 U/L 138  111  162   AST 15 - 41 U/L 49  22  18   ALT 0 - 44 U/L 25  16  9       RADIOGRAPHIC STUDIES: I have personally reviewed the radiological images as listed and agreed with the findings in the report. DG Chest Port 1 View  Result Date: 08/22/2023 CLINICAL DATA:  Short of breath, weakness, headache, history of Hodgkin lymphoma EXAM: PORTABLE CHEST 1 VIEW COMPARISON:  06/15/2023, 12/09/2021 FINDINGS: Single frontal view of the chest demonstrates stable left chest wall port via internal jugular  approach. Cardiac silhouette is enlarged. Chronic areas of scarring are seen within the right chest, with associated volume loss. Patchy left upper lobe consolidation seen on recent PET scan again noted and unchanged. No effusion or pneumothorax. No acute bony abnormalities. IMPRESSION: 1. Continued patchy left upper lobe airspace disease, corresponding to the hypermetabolic consolidation on recent PET scan and concerning for recurrent lymphoma. 2. Chronic scarring within the right chest with associated volume loss. 3. Stable enlarged cardiac silhouette. Electronically Signed   By: Sharlet Salina M.D.   On: 08/22/2023 18:37    ASSESSMENT & PLAN:  Heidi [name] is a 37 y.o. African-American female with refractory hodgkin's lymphoma.   #1 Refractory/Progressive Mixed cellularity Hodgkin's lymphoma IV -Extensive lymphadenopathy including right axillary, mediastinal and upper retroperitoneal and now biopsy proven pulmonary involvement. She was noted to have significant constitutional symptoms including significant weight loss, fevers chills and some night sweats. -See treatment history as above. Most recently, patient was lost to follow up for nearly 2 years.  -Most recent treatment includes Doxorubicin/Gemcitabine/Pembrolizumab/Vinorelbine which was started on 07/14/2023. Developed infusion reaction with doxorubicin with Cycle 1 so discontinued.    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 -Patient has completed palliative radiation to the right lung mass causing SVC compression.   #Non compliance with clinic and treatment followup. Missed 2nd dose of bendamustine with C2. Has missed multiple appointment for her PET/CT and missed appointment at [facility] for consideration of Transplant.  Pt was lost to follow up after July 2018 and returned on 08/31/18 Discussed the patient's goals of care and the pt noted that  she is ready to begin treatment again and maintain compliance and follow ups     #H/O Herpes Zoster infection -Currently resolved but with significant  post herpetic neuralgia   PLAN: -Due for Cycle 3, Day 8 today with gemcitabine and vinorelbine.  -Labs from today were reviewed. WBC 4.3, Hgb 7.7, MCV 92.4, Plt 219, creatinine and LFTs are normal -HOLD treatment today and give 1 unit of PRBC today. Will move her treatment to tomorrow morning.  -Will check iron panel to rule out blood loss since Hgb minimally improved after blood transfusion last week  FOLLOW-UP: Per treatment plan  All of the patient's questions were answered with apparent satisfaction. The patient knows to call the clinic with any problems, questions or concerns.   I have spent a total of 30 minutes minutes of face-to-face and non-face-to-face time, preparing to see the patient, performing a medically appropriate examination, counseling and educating the patient, documenting clinical information in the electronic health record,  and care coordination.   Georga Kaufmann PA-C Dept of Hematology and Oncology [facility] at [facility] Phone: [phone]

## 2023-08-31 NOTE — Patient Instructions (Signed)

## 2023-09-01 ENCOUNTER — Other Ambulatory Visit: Payer: Self-pay

## 2023-09-01 ENCOUNTER — Inpatient Hospital Stay: Payer: Medicaid Other

## 2023-09-01 VITALS — BP 127/87 | HR 87 | Temp 98.2°F | Resp 16

## 2023-09-01 DIAGNOSIS — Z5111 Encounter for antineoplastic chemotherapy: Secondary | ICD-10-CM | POA: Diagnosis not present

## 2023-09-01 DIAGNOSIS — C8128 Mixed cellularity classical Hodgkin lymphoma, lymph nodes of multiple sites: Secondary | ICD-10-CM

## 2023-09-01 DIAGNOSIS — Z7189 Other specified counseling: Secondary | ICD-10-CM

## 2023-09-01 LAB — TYPE AND SCREEN
ABO/RH(D): A POS
Antibody Screen: NEGATIVE
Unit division: 0

## 2023-09-01 LAB — BPAM RBC
Blood Product Expiration Date: 202409272359
ISSUE DATE / TIME: 202409041431
Unit Type and Rh: 6200

## 2023-09-01 MED ORDER — HEPARIN SOD (PORK) LOCK FLUSH 100 UNIT/ML IV SOLN
500.0000 [IU] | Freq: Once | INTRAVENOUS | Status: AC | PRN
  Administered 2023-09-01: 500 [IU]

## 2023-09-01 MED ORDER — SODIUM CHLORIDE 0.9 % IV SOLN
10.0000 mg | Freq: Once | INTRAVENOUS | Status: DC
  Filled 2023-09-01: qty 1

## 2023-09-01 MED ORDER — SODIUM CHLORIDE 0.9% FLUSH
10.0000 mL | INTRAVENOUS | Status: DC | PRN
  Administered 2023-09-01: 10 mL

## 2023-09-01 MED ORDER — SODIUM CHLORIDE 0.9 % IV SOLN: Freq: Once | INTRAVENOUS | Status: AC

## 2023-09-01 MED ORDER — SODIUM CHLORIDE 0.9 % IV SOLN
10.0000 mg | Freq: Once | INTRAVENOUS | Status: AC
  Administered 2023-09-01: 10 mg via INTRAVENOUS
  Filled 2023-09-01: qty 10

## 2023-09-01 MED ORDER — VINORELBINE TARTRATE CHEMO INJECTION 50 MG/5ML
20.0000 mg/m2 | Freq: Once | INTRAVENOUS | Status: AC
  Administered 2023-09-01: 34 mg via INTRAVENOUS
  Filled 2023-09-01: qty 3.4

## 2023-09-01 MED ORDER — SODIUM CHLORIDE 0.9 % IV SOLN
1000.0000 mg/m2 | Freq: Once | INTRAVENOUS | Status: AC
  Administered 2023-09-01: 1710 mg via INTRAVENOUS
  Filled 2023-09-01: qty 44.97

## 2023-09-01 MED ORDER — PALONOSETRON HCL INJECTION 0.25 MG/5ML
0.2500 mg | Freq: Once | INTRAVENOUS | Status: AC
  Administered 2023-09-01: 0.25 mg via INTRAVENOUS
  Filled 2023-09-01: qty 5

## 2023-09-01 NOTE — Patient Instructions (Signed)
[place] [facility]   Discharge Instructions: Thank you for choosing [facility] to provide your oncology and hematology care.   If you have a lab appointment with the Cancer Center, please go directly to the Cancer Center and check in at the registration area.   Wear comfortable clothing and clothing appropriate for easy access to any Portacath or PICC line.   We strive to give you quality time with your provider. You may need to reschedule your appointment if you arrive late (15 or more minutes).  Arriving late affects you and other patients whose appointments are after yours.  Also, if you miss three or more appointments without notifying the office, you may be dismissed from the clinic at the provider's discretion.      For prescription refill requests, have your pharmacy contact our office and allow 72 hours for refills to be completed.    Today you received the following chemotherapy and/or immunotherapy agents: Vinorelbine (Navelbine) and Gemcitabine (Gemzar)      To help prevent nausea and vomiting after your treatment, we encourage you to take your nausea medication as directed.  BELOW ARE SYMPTOMS THAT SHOULD BE REPORTED IMMEDIATELY: *FEVER GREATER THAN 100.4 F (38 C) OR HIGHER *CHILLS OR SWEATING *NAUSEA AND VOMITING THAT IS NOT CONTROLLED WITH YOUR NAUSEA MEDICATION *UNUSUAL SHORTNESS OF BREATH *UNUSUAL BRUISING OR BLEEDING *URINARY PROBLEMS (pain or burning when urinating, or frequent urination) *BOWEL PROBLEMS (unusual diarrhea, constipation, pain near the anus) TENDERNESS IN MOUTH AND THROAT WITH OR WITHOUT PRESENCE OF ULCERS (sore throat, sores in mouth, or a toothache) UNUSUAL RASH, SWELLING OR PAIN  UNUSUAL VAGINAL DISCHARGE OR ITCHING   Items with * indicate a potential emergency and should be followed up as soon as possible or go to the Emergency Department if any problems should occur.  Please show the CHEMOTHERAPY ALERT  CARD or IMMUNOTHERAPY ALERT CARD at check-in to the Emergency Department and triage nurse.  Should you have questions after your visit or need to cancel or reschedule your appointment, please contact [place] [facility]  Dept: [phone]  and follow the prompts.  Office hours are 8:00 a.m. to 4:30 p.m. Monday - Friday. Please note that voicemails left after 4:00 p.m. may not be returned until the following business day.  We are closed weekends and major holidays. You have access to a nurse at all times for urgent questions. Please call the main number to the clinic Dept: [phone] and follow the prompts.   For any non-urgent questions, you may also contact your provider using MyChart. We now offer e-Visits for anyone 92 and older to request care online for non-urgent symptoms. For details visit [email or web address].   Also download the MyChart app! Go to the app store, search "MyChart", open the app, select [place], and log in with your MyChart username and password.

## 2023-09-02 ENCOUNTER — Ambulatory Visit: Payer: Medicaid Other

## 2023-09-03 ENCOUNTER — Inpatient Hospital Stay: Payer: Medicaid Other

## 2023-09-03 ENCOUNTER — Other Ambulatory Visit: Payer: Self-pay

## 2023-09-03 VITALS — BP 118/74 | HR 109 | Temp 97.0°F | Resp 17

## 2023-09-03 DIAGNOSIS — Z7189 Other specified counseling: Secondary | ICD-10-CM

## 2023-09-03 DIAGNOSIS — Z5111 Encounter for antineoplastic chemotherapy: Secondary | ICD-10-CM | POA: Diagnosis not present

## 2023-09-03 DIAGNOSIS — C8128 Mixed cellularity classical Hodgkin lymphoma, lymph nodes of multiple sites: Secondary | ICD-10-CM

## 2023-09-03 MED ORDER — PEGFILGRASTIM-CBQV 6 MG/0.6ML ~~LOC~~ SOSY
6.0000 mg | PREFILLED_SYRINGE | Freq: Once | SUBCUTANEOUS | Status: AC
  Administered 2023-09-03: 6 mg via SUBCUTANEOUS

## 2023-09-04 ENCOUNTER — Other Ambulatory Visit: Payer: Self-pay

## 2023-09-07 ENCOUNTER — Other Ambulatory Visit: Payer: Self-pay

## 2023-09-07 DIAGNOSIS — D649 Anemia, unspecified: Secondary | ICD-10-CM

## 2023-09-08 ENCOUNTER — Other Ambulatory Visit: Payer: Self-pay

## 2023-09-08 ENCOUNTER — Inpatient Hospital Stay: Payer: Medicaid Other

## 2023-09-08 ENCOUNTER — Encounter (HOSPITAL_COMMUNITY): Payer: Self-pay

## 2023-09-08 ENCOUNTER — Emergency Department (HOSPITAL_COMMUNITY): Payer: Medicaid Other

## 2023-09-08 ENCOUNTER — Inpatient Hospital Stay (HOSPITAL_COMMUNITY): Payer: Medicaid Other

## 2023-09-08 ENCOUNTER — Inpatient Hospital Stay (HOSPITAL_COMMUNITY)
Admission: EM | Admit: 2023-09-08 | Discharge: 2023-09-09 | DRG: 189 | Discharge status: Against Medical Advice | Payer: Medicaid Other | Attending: Internal Medicine | Admitting: Internal Medicine

## 2023-09-08 ENCOUNTER — Inpatient Hospital Stay (HOSPITAL_BASED_OUTPATIENT_CLINIC_OR_DEPARTMENT_OTHER): Payer: Medicaid Other | Admitting: Physician Assistant

## 2023-09-08 VITALS — BP 120/73 | HR 115 | Temp 99.3°F | Resp 18 | Wt 143.8 lb

## 2023-09-08 DIAGNOSIS — Z8589 Personal history of malignant neoplasm of other organs and systems: Secondary | ICD-10-CM

## 2023-09-08 DIAGNOSIS — C8190 Hodgkin lymphoma, unspecified, unspecified site: Secondary | ICD-10-CM | POA: Diagnosis present

## 2023-09-08 DIAGNOSIS — R55 Syncope and collapse: Secondary | ICD-10-CM | POA: Diagnosis not present

## 2023-09-08 DIAGNOSIS — R000 Tachycardia, unspecified: Secondary | ICD-10-CM

## 2023-09-08 DIAGNOSIS — C8198 Hodgkin lymphoma, unspecified, lymph nodes of multiple sites: Secondary | ICD-10-CM

## 2023-09-08 DIAGNOSIS — I10 Essential (primary) hypertension: Secondary | ICD-10-CM | POA: Diagnosis present

## 2023-09-08 DIAGNOSIS — J3489 Other specified disorders of nose and nasal sinuses: Secondary | ICD-10-CM | POA: Diagnosis not present

## 2023-09-08 DIAGNOSIS — Z5329 Procedure and treatment not carried out because of patient's decision for other reasons: Secondary | ICD-10-CM | POA: Diagnosis present

## 2023-09-08 DIAGNOSIS — Z7189 Other specified counseling: Secondary | ICD-10-CM

## 2023-09-08 DIAGNOSIS — Z95828 Presence of other vascular implants and grafts: Secondary | ICD-10-CM | POA: Diagnosis not present

## 2023-09-08 DIAGNOSIS — D6481 Anemia due to antineoplastic chemotherapy: Secondary | ICD-10-CM | POA: Diagnosis present

## 2023-09-08 DIAGNOSIS — F17210 Nicotine dependence, cigarettes, uncomplicated: Secondary | ICD-10-CM | POA: Diagnosis present

## 2023-09-08 DIAGNOSIS — D6959 Other secondary thrombocytopenia: Secondary | ICD-10-CM | POA: Diagnosis present

## 2023-09-08 DIAGNOSIS — Z1152 Encounter for screening for COVID-19: Secondary | ICD-10-CM

## 2023-09-08 DIAGNOSIS — I3139 Other pericardial effusion (noninflammatory): Secondary | ICD-10-CM

## 2023-09-08 DIAGNOSIS — J9601 Acute respiratory failure with hypoxia: Principal | ICD-10-CM | POA: Diagnosis present

## 2023-09-08 DIAGNOSIS — R059 Cough, unspecified: Secondary | ICD-10-CM | POA: Diagnosis present

## 2023-09-08 DIAGNOSIS — Z91199 Patient's noncompliance with other medical treatment and regimen due to unspecified reason: Secondary | ICD-10-CM | POA: Diagnosis not present

## 2023-09-08 DIAGNOSIS — Z888 Allergy status to other drugs, medicaments and biological substances status: Secondary | ICD-10-CM | POA: Diagnosis not present

## 2023-09-08 DIAGNOSIS — T451X5A Adverse effect of antineoplastic and immunosuppressive drugs, initial encounter: Secondary | ICD-10-CM | POA: Diagnosis present

## 2023-09-08 DIAGNOSIS — R0902 Hypoxemia: Principal | ICD-10-CM

## 2023-09-08 DIAGNOSIS — D649 Anemia, unspecified: Secondary | ICD-10-CM

## 2023-09-08 DIAGNOSIS — Z79899 Other long term (current) drug therapy: Secondary | ICD-10-CM

## 2023-09-08 DIAGNOSIS — J45909 Unspecified asthma, uncomplicated: Secondary | ICD-10-CM | POA: Diagnosis not present

## 2023-09-08 DIAGNOSIS — R0602 Shortness of breath: Secondary | ICD-10-CM | POA: Diagnosis present

## 2023-09-08 LAB — CBC WITH DIFFERENTIAL (CANCER CENTER ONLY)
Abs Immature Granulocytes: 2.43 10*3/uL — ABNORMAL HIGH (ref 0.00–0.07)
Basophils Absolute: 0 10*3/uL (ref 0.0–0.1)
Basophils Relative: 0 %
Eosinophils Absolute: 0 10*3/uL (ref 0.0–0.5)
Eosinophils Relative: 0 %
HCT: 22.9 % — ABNORMAL LOW (ref 36.0–46.0)
Hemoglobin: 7.7 g/dL — ABNORMAL LOW (ref 12.0–15.0)
Immature Granulocytes: 7 %
Lymphocytes Relative: 3 %
Lymphs Abs: 1 10*3/uL (ref 0.7–4.0)
MCH: 30 pg (ref 26.0–34.0)
MCHC: 33.6 g/dL (ref 30.0–36.0)
MCV: 89.1 fL (ref 80.0–100.0)
Monocytes Absolute: 2.4 10*3/uL — ABNORMAL HIGH (ref 0.1–1.0)
Monocytes Relative: 7 %
Neutro Abs: 29.6 10*3/uL — ABNORMAL HIGH (ref 1.7–7.7)
Neutrophils Relative %: 83 %
Platelet Count: 39 10*3/uL — ABNORMAL LOW (ref 150–400)
RBC: 2.57 MIL/uL — ABNORMAL LOW (ref 3.87–5.11)
RDW: 18 % — ABNORMAL HIGH (ref 11.5–15.5)
WBC Count: 35.4 10*3/uL — ABNORMAL HIGH (ref 4.0–10.5)
nRBC: 0.3 % — ABNORMAL HIGH (ref 0.0–0.2)

## 2023-09-08 LAB — CMP (CANCER CENTER ONLY)
ALT: 10 U/L (ref 0–44)
AST: 14 U/L — ABNORMAL LOW (ref 15–41)
Albumin: 4 g/dL (ref 3.5–5.0)
Alkaline Phosphatase: 141 U/L — ABNORMAL HIGH (ref 38–126)
Anion gap: 8 (ref 5–15)
BUN: 7 mg/dL (ref 6–20)
CO2: 29 mmol/L (ref 22–32)
Calcium: 9 mg/dL (ref 8.9–10.3)
Chloride: 99 mmol/L (ref 98–111)
Creatinine: 0.79 mg/dL (ref 0.44–1.00)
GFR, Estimated: >60 mL/min (ref >60)
Glucose, Bld: 127 mg/dL — ABNORMAL HIGH (ref 70–99)
Potassium: 3.8 mmol/L (ref 3.5–5.1)
Sodium: 136 mmol/L (ref 135–145)
Total Bilirubin: 1.2 mg/dL (ref 0.3–1.2)
Total Protein: 6.3 g/dL — ABNORMAL LOW (ref 6.5–8.1)

## 2023-09-08 LAB — RESP PANEL BY RT-PCR (RSV, FLU A&B, COVID)  RVPGX2
Influenza A by PCR: NEGATIVE
Influenza B by PCR: NEGATIVE
Resp Syncytial Virus by PCR: NEGATIVE
SARS Coronavirus 2 by RT PCR: NEGATIVE

## 2023-09-08 LAB — URINALYSIS, W/ REFLEX TO CULTURE (INFECTION SUSPECTED)
Bilirubin Urine: NEGATIVE
Glucose, UA: NEGATIVE mg/dL
Hgb urine dipstick: NEGATIVE
Ketones, ur: NEGATIVE mg/dL
Nitrite: NEGATIVE
Protein, ur: NEGATIVE mg/dL
Specific Gravity, Urine: 1.008 (ref 1.005–1.030)
pH: 7 (ref 5.0–8.0)

## 2023-09-08 LAB — TROPONIN I (HIGH SENSITIVITY)
Troponin I (High Sensitivity): 23 ng/L — ABNORMAL HIGH (ref <18)
Troponin I (High Sensitivity): 21 ng/L — ABNORMAL HIGH (ref <18)

## 2023-09-08 LAB — BRAIN NATRIURETIC PEPTIDE: B Natriuretic Peptide: 1052.1 pg/mL — ABNORMAL HIGH (ref 0.0–100.0)

## 2023-09-08 LAB — I-STAT CHEM 8, ED
BUN: 5 mg/dL — ABNORMAL LOW (ref 6–20)
Calcium, Ion: 1.14 mmol/L — ABNORMAL LOW (ref 1.15–1.40)
Chloride: 96 mmol/L — ABNORMAL LOW (ref 98–111)
Creatinine, Ser: 0.7 mg/dL (ref 0.44–1.00)
Glucose, Bld: 156 mg/dL — ABNORMAL HIGH (ref 70–99)
HCT: 23 % — ABNORMAL LOW (ref 36.0–46.0)
Hemoglobin: 7.8 g/dL — ABNORMAL LOW (ref 12.0–15.0)
Potassium: 3.5 mmol/L (ref 3.5–5.1)
Sodium: 134 mmol/L — ABNORMAL LOW (ref 135–145)
TCO2: 24 mmol/L (ref 22–32)

## 2023-09-08 LAB — ECHOCARDIOGRAM COMPLETE
S' Lateral: 3.6 cm
Weight: 2300.8 [oz_av]

## 2023-09-08 LAB — I-STAT CG4 LACTIC ACID, ED: Lactic Acid, Venous: 1.8 mmol/L (ref 0.5–1.9)

## 2023-09-08 LAB — SAMPLE TO BLOOD BANK

## 2023-09-08 LAB — APTT: aPTT: 41 s — ABNORMAL HIGH (ref 24–36)

## 2023-09-08 LAB — PROTIME-INR
INR: 1.3 — ABNORMAL HIGH (ref 0.8–1.2)
Prothrombin Time: 16.1 s — ABNORMAL HIGH (ref 11.4–15.2)

## 2023-09-08 LAB — HCG, SERUM, QUALITATIVE: Preg, Serum: NEGATIVE

## 2023-09-08 MED ORDER — TRAZODONE HCL 50 MG PO TABS: 25.0000 mg | ORAL_TABLET | Freq: Every evening | ORAL | Status: DC | PRN

## 2023-09-08 MED ORDER — ACETAMINOPHEN 650 MG RE SUPP: 650.0000 mg | Freq: Four times a day (QID) | RECTAL | Status: DC | PRN

## 2023-09-08 MED ORDER — SODIUM CHLORIDE 0.9% FLUSH
10.0000 mL | INTRAVENOUS | Status: DC | PRN
  Administered 2023-09-08: 10 mL

## 2023-09-08 MED ORDER — ACETAMINOPHEN 325 MG PO TABS
650.0000 mg | ORAL_TABLET | Freq: Four times a day (QID) | ORAL | Status: DC | PRN
  Administered 2023-09-08 – 2023-09-09 (×2): 650 mg via ORAL
  Filled 2023-09-08 (×2): qty 2

## 2023-09-08 MED ORDER — VANCOMYCIN HCL IN DEXTROSE 1-5 GM/200ML-% IV SOLN
1000.0000 mg | Freq: Two times a day (BID) | INTRAVENOUS | Status: DC
  Administered 2023-09-08 – 2023-09-09 (×2): 1000 mg via INTRAVENOUS
  Filled 2023-09-08 (×2): qty 200

## 2023-09-08 MED ORDER — ALBUTEROL SULFATE (2.5 MG/3ML) 0.083% IN NEBU
2.5000 mg | INHALATION_SOLUTION | RESPIRATORY_TRACT | Status: DC | PRN
  Administered 2023-09-09: 2.5 mg via RESPIRATORY_TRACT
  Filled 2023-09-08: qty 3

## 2023-09-08 MED ORDER — LEVALBUTEROL HCL 0.63 MG/3ML IN NEBU
0.6300 mg | INHALATION_SOLUTION | Freq: Once | RESPIRATORY_TRACT | Status: AC
  Administered 2023-09-08: 0.63 mg via RESPIRATORY_TRACT
  Filled 2023-09-08: qty 3

## 2023-09-08 MED ORDER — VANCOMYCIN HCL 1500 MG/300ML IV SOLN
1500.0000 mg | Freq: Once | INTRAVENOUS | Status: AC
  Administered 2023-09-08: 1500 mg via INTRAVENOUS
  Filled 2023-09-08: qty 300

## 2023-09-08 MED ORDER — LIDOCAINE HCL 1 % IJ SOLN
INTRAMUSCULAR | Status: AC
  Filled 2023-09-08: qty 20

## 2023-09-08 MED ORDER — VANCOMYCIN HCL IN DEXTROSE 1-5 GM/200ML-% IV SOLN: 1000.0000 mg | Freq: Once | INTRAVENOUS | Status: DC

## 2023-09-08 MED ORDER — SODIUM CHLORIDE 0.9 % IV SOLN
2.0000 g | Freq: Once | INTRAVENOUS | Status: AC
  Administered 2023-09-08: 2 g via INTRAVENOUS
  Filled 2023-09-08: qty 12.5

## 2023-09-08 MED ORDER — SODIUM CHLORIDE 0.9 % IV SOLN
2.0000 g | Freq: Three times a day (TID) | INTRAVENOUS | Status: DC
  Administered 2023-09-08 – 2023-09-09 (×3): 2 g via INTRAVENOUS
  Filled 2023-09-08 (×3): qty 12.5

## 2023-09-08 MED ORDER — IOHEXOL 350 MG/ML SOLN
75.0000 mL | Freq: Once | INTRAVENOUS | Status: AC | PRN
  Administered 2023-09-08: 75 mL via INTRAVENOUS

## 2023-09-08 MED ORDER — FUROSEMIDE 10 MG/ML IJ SOLN
20.0000 mg | Freq: Once | INTRAMUSCULAR | Status: AC
  Administered 2023-09-08: 20 mg via INTRAVENOUS
  Filled 2023-09-08: qty 2

## 2023-09-08 MED ORDER — ONDANSETRON HCL 4 MG PO TABS: 4.0000 mg | ORAL_TABLET | Freq: Four times a day (QID) | ORAL | Status: DC | PRN

## 2023-09-08 MED ORDER — MORPHINE SULFATE (PF) 2 MG/ML IV SOLN
1.0000 mg | Freq: Once | INTRAVENOUS | Status: AC
  Administered 2023-09-08: 1 mg via INTRAVENOUS
  Filled 2023-09-08: qty 1

## 2023-09-08 MED ORDER — HEPARIN SOD (PORK) LOCK FLUSH 100 UNIT/ML IV SOLN
500.0000 [IU] | Freq: Once | INTRAVENOUS | Status: AC
  Administered 2023-09-08: 500 [IU]
  Filled 2023-09-08: qty 5

## 2023-09-08 MED ORDER — ONDANSETRON HCL 4 MG/2ML IJ SOLN: 4.0000 mg | Freq: Four times a day (QID) | INTRAMUSCULAR | Status: DC | PRN

## 2023-09-08 MED ORDER — LACTATED RINGERS IV BOLUS (SEPSIS)
1000.0000 mL | Freq: Once | INTRAVENOUS | Status: AC
  Administered 2023-09-08: 1000 mL via INTRAVENOUS

## 2023-09-08 NOTE — Progress Notes (Signed)
PHARMACY -  BRIEF ANTIBIOTIC NOTE   Pharmacy has received consult(s) for vancomycin and cefepime from an ED provider.  The patient's profile has been reviewed for ht/wt/allergies/indication/available labs.    One time order(s) placed for vancomycin 1500 mg + cefepime 2 g  Further antibiotics/pharmacy consults should be ordered by admitting physician if indicated.                       Thank you,  Pricilla Riffle, PharmD, BCPS Clinical Pharmacist 09/08/2023 10:36 AM

## 2023-09-08 NOTE — Progress Notes (Signed)
Symptom Management Consult Note [facility]    Patient Care Team: Pcp, No as PCP - General    Name / MRN / DOBKimesha [name]  [id number]  May 30, 1986   Date of visit: 09/08/2023   Chief Complaint/Reason for visit: weakness and shortness of breath   Current Therapy: Gemzar, Navelbine  Last treatment:  Day 8   cycle 3 on 09/01/23   ASSESSMENT & PLAN: Patient is a 37 y.o. female with oncologic history of refractory Mixed cellularity Hodgkin's lymphoma IVBE followed by Dr. Candise Che.  I have viewed most recent oncology note and lab work.    #Refractory Mixed cellularity Hodgkin's lymphoma IVBE  - Next appointment with oncologist is 09/08/23   #Shortness of breath -On arrival patient is ill-appearing.  She is tachycardic to 136 with oxygen saturation of 81% on room air.  Normotensive. -Patient placed on 2 L nasal cannula and oxygen saturation improved to 94%.  Lung exam reveals decreased breath sounds on the left compared to right.  No lower extremity edema to suggest volume overload. -Discussed with patient concerning vitals.  She will need ED evaluation for further workup. -Quick chart review shows patient had chest x-ray 08/22/2023 in the ED showing patchy left upper lobe consolidation seen on recent PET, no effusion or pneumothorax.  With her worsening symptoms and abnormal vital signs concern for disease progression.  CBC and CMP collected on clinic arrival is still in process at time of ED transfer. -Report given to accepting ED RN and oncologist notified.    Heme/Onc History: Oncology History  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)  12/14/2016 Initial Diagnosis   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)   10/09/2018 - 11/17/2018 Chemotherapy   The patient had pegfilgrastim (NEULASTA) injection 6 mg, 6 mg, Subcutaneous, Once, 2 of 3 cycles Administration: 6 mg (10/13/2018), 6 mg  (11/17/2018) ondansetron (ZOFRAN) 16 mg, dexamethasone (DECADRON) 20 mg in sodium chloride 0.9 % 50 mL IVPB, , Intravenous,  Once, 2 of 3 cycles Administration: 16 mg (10/09/2018), 8 mg (10/10/2018), 36 mg (10/11/2018), 16 mg (11/14/2018), 10 mg (11/15/2018), 36 mg (11/16/2018) CARBOplatin (PARAPLATIN) 610 mg in sodium chloride 0.9 % 250 mL chemo infusion, 610 mg (100 % of original dose 608.5 mg), Intravenous,  Once, 2 of 3 cycles Dose modification:   (original dose 608.5 mg, Cycle 1) Administration: 610 mg (10/10/2018), 610 mg (11/15/2018) etoposide (VEPESID) 170 mg in sodium chloride 0.9 % 500 mL chemo infusion, 100 mg/m2 = 170 mg, Intravenous,  Once, 2 of 3 cycles Administration: 170 mg (10/09/2018), 170 mg (10/10/2018), 170 mg (10/11/2018), 170 mg (11/14/2018), 170 mg (11/15/2018), 170 mg (11/16/2018) ifosfamide (IFEX) 8,450 mg, mesna (MESNEX) 8,450 mg in sodium chloride 0.9 % 1,000 mL chemo infusion, , Intravenous,  Once, 2 of 3 cycles Administration:  (10/10/2018),  (11/15/2018)  for chemotherapy treatment.    07/14/2023 -  Chemotherapy   Patient is on Treatment Plan : HODGKINS LYMPHOMA (RELAPSED/REFRACTORY) PEMBROLIZUMAB D1 + GEMCITABINE D1,8 + VINORELBINE D1,8 + LIPOSOMAL DOXORUBICIN D1,8 Q21D     Hodgkin's lymphoma (HCC)  11/14/2018 Initial Diagnosis   Hodgkin's lymphoma (HCC)   01/09/2019 - 02/18/2021 Chemotherapy   Patient is on Treatment Plan : HEAD/NECK Pembrolizumab Q21D         Interval history-: Heidi [name] is a 37 y.o. female with oncologic history as above presenting to [facility] today with chief complaint of weakness and shortness of breath.  She is accompanied by her son who provides additional history.  Patient states her shortness of breath started x 2 days ago.  It has been progressively worsening.  Patient states she is unable to lay flat at night because she feels as if she cannot breathe.  She is unable to walk short distances before having to stop and catch her  breath.  She has not checked her oxygen saturation or heart rate at home.  She denies any chest pain.  She does endorse cough that is nonproductive.  Denies any fever or chills.  Denies any leg swelling or palpitations.  Patient called clinic yesterday afternoon because she thought her symptoms could be related to anemia and wanted to come in for a blood transfusion today.  No over-the-counter medications taken prior to arrival.   ROS  All other systems are reviewed and are negative for acute change except as noted in the HPI.    Allergies  Allergen Reactions   Doxorubicin Hcl Liposomal Anaphylaxis and Shortness Of Breath    Pt had severe hypersensitivity reaction with chest pain and SOB immediately following start of medication. See hypersensitivity note from 07/14/2023.     Past Medical History:  Diagnosis Date   ARDS (adult respiratory distress syndrome) (HCC)    Asthma    Hodgkin lymphoma (HCC)    Hypertension      Past Surgical History:  Procedure Laterality Date   AXILLARY LYMPH NODE BIOPSY Right 03/19/2016   Procedure: AXILLARY LYMPH NODE BIOPSY;  Surgeon: Darnell Level, MD;  Location: [facility]S;  Service: General;  Laterality: Right;   IR GENERIC HISTORICAL  12/22/2016   IR FLUORO GUIDE PORT INSERTION LEFT 12/22/2016 [facility]   IR GENERIC HISTORICAL  12/22/2016   IR US GUIDE VASC ACCESS LEFT 12/22/2016 [facility]   IR GENERIC HISTORICAL  12/22/2016   IR CV LINE INJECTION 12/22/2016 [facility]   IR GENERIC HISTORICAL  12/22/2016   IR REMOVAL TUN ACCESS W/ PORT W/O FL MOD SED 12/22/2016 [facility]   VIDEO BRONCHOSCOPY Bilateral 11/26/2016   Procedure: VIDEO BRONCHOSCOPY WITH FLUORO;  Surgeon: Oretha Milch, MD;  Location: [facility];  Service: Cardiopulmonary;  Laterality: Bilateral;    Social History   Socioeconomic History   Marital status: Single    Spouse name: Not on file   Number of children: Not on file   Years of education: Not on file   Highest  education level: Not on file  Occupational History   Not on file  Tobacco Use   Smoking status: Some Days    Current packs/day: 0.00    Average packs/day: 0.5 packs/day for 15.0 years (7.5 ttl pk-yrs)    Types: Cigarettes    Start date: 03/27/2001    Last attempt to quit: 03/27/2016    Years since quitting: 7.4   Smokeless tobacco: Never  Vaping Use   Vaping status: Never Used  Substance and Sexual Activity   Alcohol use: Yes    Comment: occasional now   Drug use: Not Currently   Sexual activity: Yes    Birth control/protection: None  Other Topics Concern   Not on file  Social History Narrative   Not on file   Social Determinants of Health   Financial Resource Strain: Not on file  Food Insecurity: Not on file  Transportation Needs: Unmet Transportation Needs (10/24/2018)   PRAPARE - Administrator, Civil Service (Medical): Yes    Lack of Transportation (Non-Medical): Yes  Physical  Activity: Not on file  Stress: Not on file  Social Connections: Not on file  Intimate Partner Violence: Not on file    No family history on file.   Current Outpatient Medications:    furosemide (LASIX) 20 MG tablet, Take 1 tablet (20 mg total) by mouth daily for 5 days., Disp: 5 tablet, Rfl: 0   gabapentin (NEURONTIN) 300 MG capsule, Take 1 capsule (300 mg total) by mouth at bedtime. (Patient not taking: Reported on 08/11/2023), Disp: 30 capsule, Rfl: 1   HYDROcodone-acetaminophen (NORCO/VICODIN) 5-325 MG tablet, Take 1 tablet by mouth every 6 (six) hours as needed. (Patient not taking: Reported on 08/11/2023), Disp: 10 tablet, Rfl: 0   lidocaine (LIDODERM) 5 %, Place 1 patch onto the skin daily. Remove & Discard patch within 12 hours or as directed by MD (Patient not taking: Reported on 08/11/2023), Disp: 30 patch, Rfl: 0   lidocaine-prilocaine (EMLA) cream, Apply to affected area once (Patient not taking: Reported on 08/11/2023), Disp: 30 g, Rfl: 3   ondansetron (ZOFRAN) 8 MG tablet,  Take 1 tablet (8 mg total) by mouth every 8 (eight) hours as needed for nausea or vomiting., Disp: 30 tablet, Rfl: 1   prochlorperazine (COMPAZINE) 10 MG tablet, Take 1 tablet (10 mg total) by mouth every 6 (six) hours as needed for nausea or vomiting., Disp: 30 tablet, Rfl: 1 No current facility-administered medications for this visit.  Facility-Administered Medications Ordered in Other Visits:    sodium chloride flush (NS) 0.9 % injection 10 mL, 10 mL, Intracatheter, PRN, Candise Che, Corene Cornea, MD  PHYSICAL EXAM: ECOG FS:2 - Symptomatic, <50% confined to bed    Vitals:   09/08/23 0900 09/08/23 0905  BP: 120/73   Pulse: (!) 136 (!) 121  Resp: 18   Temp: 99.3 F (37.4 C)   TempSrc: Oral   SpO2: (!) 81% 95%  Weight: 143 lb 12.8 oz (65.2 kg)    Physical Exam Vitals and nursing note reviewed.  Constitutional:      Appearance: She is ill-appearing. She is not toxic-appearing.  HENT:     Head: Normocephalic.  Eyes:     Conjunctiva/sclera: Conjunctivae normal.  Cardiovascular:     Rate and Rhythm: Regular rhythm. Tachycardia present.     Pulses: Normal pulses.     Heart sounds: Normal heart sounds.     Comments: HR ranging from 114-128 during exam Pulmonary:     Effort: Respiratory distress present.     Comments: Tachypneic Decreased lung sounds on left compared to right.  No Rales or rhonchi.  No wheezing. Abdominal:     General: There is no distension.  Musculoskeletal:     Cervical back: Normal range of motion.     Right lower leg: No edema.     Left lower leg: No edema.  Skin:    General: Skin is warm and dry.  Neurological:     Mental Status: She is alert.        LABORATORY DATA: I have reviewed the data as listed    Latest Ref Rng & Units 08/31/2023   11:08 AM 08/25/2023    2:13 PM 08/11/2023   10:08 AM  CBC  WBC 4.0 - 10.5 K/uL 4.3  20.6  2.1   Hemoglobin 12.0 - 15.0 g/dL 7.7  7.2  9.4   Hematocrit 36.0 - 46.0 % 23.1  21.7  27.1   Platelets 150 - 400 K/uL  333  235  274  Latest Ref Rng & Units 08/31/2023   11:08 AM 08/25/2023    2:13 PM 08/11/2023   10:08 AM  CMP  Glucose 70 - 99 mg/dL 80  409  70   BUN 6 - 20 mg/dL 17  15  10    Creatinine 0.44 - 1.00 mg/dL 8.11  9.14  7.82   Sodium 135 - 145 mmol/L 139  137  136   Potassium 3.5 - 5.1 mmol/L 4.4  4.1  4.1   Chloride 98 - 111 mmol/L 103  104  101   CO2 22 - 32 mmol/L 30  26  30    Calcium 8.9 - 10.3 mg/dL 8.7  8.8  8.6   Total Protein 6.5 - 8.1 g/dL 5.9  6.4  6.5   Total Bilirubin 0.3 - 1.2 mg/dL 0.5  0.6  0.5   Alkaline Phos 38 - 126 U/L 90  138  111   AST 15 - 41 U/L 15  49  22   ALT 0 - 44 U/L 14  25  16         RADIOGRAPHIC STUDIES (from last 24 hours if applicable) I have personally reviewed the radiological images as listed and agreed with the findings in the report. No results found.      Visit Diagnosis: 1. Port-A-Cath in place   2. Hodgkin lymphoma of lymph nodes of multiple regions, unspecified Hodgkin lymphoma type (HCC)   3. Tachycardia   4. Hypoxia      No orders of the defined types were placed in this encounter.   All questions were answered. The patient knows to call the clinic with any problems, questions or concerns. No barriers to learning was detected.  A total of more than 30 minutes were spent on this encounter with face-to-face time and non-face-to-face time, including preparing to see the patient, ordering tests and/or medications, counseling the patient and coordination of care as outlined above.    Thank you for allowing me to participate in the care of this patient.    Shanon Ace, PA-C Department of Hematology/Oncology [facility] at [facility] Phone: [phone]  [phone]    09/08/2023 10:04 AM

## 2023-09-08 NOTE — ED Triage Notes (Signed)
Pt coming from [facility] c/o sob. Upon arrival there staff states pt pulse ox was 81% on RA. Pt was tachy as well. Pt was placed on [place] 2L with pulse ox increasing to 96%. Pt states she has had a non-productive cough for the past few days. Pt denies any chest pain.

## 2023-09-08 NOTE — Sepsis Progress Note (Signed)
Elink monitoring for the code sepsis protocol.  

## 2023-09-08 NOTE — ED Provider Notes (Signed)
[place] EMERGENCY DEPARTMENT AT [facility] Provider Note   CSN: [id number] Arrival date & time: 09/08/23  1008     History  Chief Complaint  Patient presents with   Shortness of Breath    Heidi [name] is a 37 y.o. female.   Shortness of Breath 37 year old female history of Hodgkin's lymphoma currently on chemotherapy presenting for shortness of breath.  She is dealt short of breath and has had nonproductive cough for couple days.  No chest pain other than when she coughs.  She is felt weak and fatigued.  She went to her oncology appointment today who sent her to the ED because she was hypoxic and tachycardic per denies leg swelling or history of PE or DVT.  No abdominal pain or vomiting or diarrhea.  No fevers.     Home Medications Prior to Admission medications   Medication Sig Start Date End Date Taking? Authorizing Provider  furosemide (LASIX) 20 MG tablet Take 1 tablet (20 mg total) by mouth daily for 5 days. Patient not taking: Reported on 09/08/2023 07/14/23 07/19/23  Shanon Ace, PA-C  gabapentin (NEURONTIN) 300 MG capsule Take 1 capsule (300 mg total) by mouth at bedtime. Patient not taking: Reported on 08/11/2023 05/27/23   [name], MD  HYDROcodone-acetaminophen (NORCO/VICODIN) 5-325 MG tablet Take 1 tablet by mouth every 6 (six) hours as needed. Patient not taking: Reported on 08/11/2023 07/09/23   Barrie Dunker B, PA-C  lidocaine (LIDODERM) 5 % Place 1 patch onto the skin daily. Remove & Discard patch within 12 hours or as directed by MD Patient not taking: Reported on 08/11/2023 03/29/23   Prosperi, Christian H, PA-C  lidocaine-prilocaine (EMLA) cream Apply to affected area once Patient not taking: Reported on 08/11/2023 06/30/23   [name], MD  ondansetron (ZOFRAN) 8 MG tablet Take 1 tablet (8 mg total) by mouth every 8 (eight) hours as needed for nausea or vomiting. Patient not taking: Reported on 09/08/2023 06/30/23    [name], MD  prochlorperazine (COMPAZINE) 10 MG tablet Take 1 tablet (10 mg total) by mouth every 6 (six) hours as needed for nausea or vomiting. Patient not taking: Reported on 09/08/2023 06/30/23   [name], MD      Allergies    Doxorubicin hcl liposomal    Review of Systems   Review of Systems  Respiratory:  Positive for shortness of breath.   Review of systems completed and notable as per HPI.  ROS otherwise negative.   Physical Exam Updated Vital Signs BP (!) 140/84   Pulse (!) 113   Temp 99.4 F (37.4 C) (Oral)   Resp (!) 29   SpO2 95%  Physical Exam Vitals and nursing note reviewed.  Constitutional:      General: She is not in acute distress.    Appearance: She is well-developed.  HENT:     Head: Normocephalic and atraumatic.  Eyes:     Conjunctiva/sclera: Conjunctivae normal.  Cardiovascular:     Rate and Rhythm: Regular rhythm. Tachycardia present.     Heart sounds: No murmur heard. Pulmonary:     Effort: Pulmonary effort is normal. Tachypnea present. No respiratory distress.     Breath sounds: Normal breath sounds.  Abdominal:     Palpations: Abdomen is soft.     Tenderness: There is no abdominal tenderness.  Musculoskeletal:        General: No swelling.     Cervical back: Neck supple.  Right lower leg: No tenderness. No edema.     Left lower leg: No tenderness. No edema.  Skin:    General: Skin is warm and dry.     Capillary Refill: Capillary refill takes less than 2 seconds.  Neurological:     Mental Status: She is alert.  Psychiatric:        Mood and Affect: Mood normal.     ED Results / Procedures / Treatments   Labs (all labs ordered are listed, but only abnormal results are displayed) Labs Reviewed  PROTIME-INR - Abnormal; Notable for the following components:      Result Value   Prothrombin Time 16.1 (*)    INR 1.3 (*)    All other components within normal limits  APTT - Abnormal; Notable for the following  components:   aPTT 41 (*)    All other components within normal limits  URINALYSIS, W/ REFLEX TO CULTURE (INFECTION SUSPECTED) - Abnormal; Notable for the following components:   Leukocytes,Ua TRACE (*)    Bacteria, UA MANY (*)    All other components within normal limits  I-STAT CHEM 8, ED - Abnormal; Notable for the following components:   Sodium 134 (*)    Chloride 96 (*)    BUN 5 (*)    Glucose, Bld 156 (*)    Calcium, Ion 1.14 (*)    Hemoglobin 7.8 (*)    HCT 23.0 (*)    All other components within normal limits  TROPONIN I (HIGH SENSITIVITY) - Abnormal; Notable for the following components:   Troponin I (High Sensitivity) 23 (*)    All other components within normal limits  TROPONIN I (HIGH SENSITIVITY) - Abnormal; Notable for the following components:   Troponin I (High Sensitivity) 21 (*)    All other components within normal limits  RESP PANEL BY RT-PCR (RSV, FLU A&B, COVID)  RVPGX2  CULTURE, BLOOD (ROUTINE X 2)  CULTURE, BLOOD (ROUTINE X 2)  HCG, SERUM, QUALITATIVE  BRAIN NATRIURETIC PEPTIDE  HIV ANTIBODY (ROUTINE TESTING W REFLEX)  I-STAT CG4 LACTIC ACID, ED  I-STAT CG4 LACTIC ACID, ED    EKG None  Radiology [place] CHEST (PLEURAL EFFUSION)  Result Date: 09/08/2023 CLINICAL DATA:  37 year old female history of pleural effusion. EXAM: CHEST ULTRASOUND COMPARISON:  Chest CT from 09/08/2023 FINDINGS: Trace left pleural effusion. No evidence of significant right pleural effusion. IMPRESSION: No sufficient pleural fluid for thoracentesis. No thoracentesis was performed. Marliss Coots, MD [facility] Radiology Electronically Signed   By: Marliss Coots M.D.   On: 09/08/2023 16:28   ECHOCARDIOGRAM COMPLETE  Result Date: 09/08/2023    ECHOCARDIOGRAM REPORT   Patient Name:   Heidi [name] Date of Exam: 09/08/2023 Medical Rec #:  [id number]          Height:       67.0 in Accession #:    [id number]         Weight:       143.8 lb  Date of Birth:  1986/08/24         BSA:          1.758 m Patient Age:    36 years           BP:           139/90 mmHg Patient Gender: F                  HR:  118 bpm. Exam Location:  Inpatient Procedure: 2D Echo, Cardiac Doppler and Color Doppler Indications:    Pericardial Effusion I31.3  History:        Patient has prior history of Echocardiogram examinations, most                 recent 07/04/2023.  Sonographer:    Harriette Bouillon RDCS Referring Phys: [id number] Elenor Quinones Esli Clements IMPRESSIONS  1. Left ventricular ejection fraction, by estimation, is 65 to 70%. The left ventricle has normal function. The left ventricle has no regional wall motion abnormalities. Left ventricular diastolic parameters are indeterminate. There is the interventricular septum is flattened in systole, consistent with right ventricular pressure overload.  2. Right ventricular systolic function is moderately reduced. The right ventricular size is normal. Tricuspid regurgitation signal is inadequate for assessing PA pressure.  3. Small to moderate circumfrential pericardial effusion. The pericardial effusion is circumferential.  4. The mitral valve is normal in structure. Trivial mitral valve regurgitation. No evidence of mitral stenosis.  5. The aortic valve is tricuspid. Aortic valve regurgitation is not visualized. No aortic stenosis is present.  6. The inferior vena cava is dilated in size with <50% respiratory variability, suggesting right atrial pressure of 15 mmHg. Conclusion(s)/Recommendation(s): Technically limited echo due to poor sound wave transmission. There is a small to moderate pericardial efussion with a dilated IVC. Doubt tamponade physiology but mitral inflow with respirometer not performed. Given pericardial effusion, RV dysfunction and IVC dialtion would consider evalaution for pulmonary HTN. FINDINGS  Left Ventricle: Left ventricular ejection fraction, by estimation, is 65 to 70%. The left ventricle has normal  function. The left ventricle has no regional wall motion abnormalities. The left ventricular internal cavity size was normal in size. There is  no left ventricular hypertrophy. The interventricular septum is flattened in systole, consistent with right ventricular pressure overload. Left ventricular diastolic parameters are indeterminate. Right Ventricle: The right ventricular size is normal. Right vetricular wall thickness was not well visualized. Right ventricular systolic function is moderately reduced. Tricuspid regurgitation signal is inadequate for assessing PA pressure. Left Atrium: Left atrial size was normal in size. Right Atrium: Right atrial size was normal in size. Pericardium: A moderately sized pericardial effusion is present. The pericardial effusion is circumferential. Mitral Valve: The mitral valve is normal in structure. Trivial mitral valve regurgitation. No evidence of mitral valve stenosis. Tricuspid Valve: The tricuspid valve is not well visualized. Tricuspid valve regurgitation is trivial. No evidence of tricuspid stenosis. Aortic Valve: The aortic valve is tricuspid. Aortic valve regurgitation is not visualized. No aortic stenosis is present. Pulmonic Valve: The pulmonic valve was normal in structure. Pulmonic valve regurgitation is not visualized. No evidence of pulmonic stenosis. Aorta: The aortic root is normal in size and structure. Venous: The inferior vena cava is dilated in size with less than 50% respiratory variability, suggesting right atrial pressure of 15 mmHg. IAS/Shunts: No atrial level shunt detected by color flow Doppler.  LEFT VENTRICLE PLAX 2D LVIDd:         4.40 cm   Diastology LVIDs:         3.60 cm   LV e' lateral: 8.92 cm/s LV PW:         0.90 cm LV IVS:        0.80 cm LVOT diam:     2.20 cm LV SV:         51 LV SV Index:   29 LVOT Area:     3.80 cm  RIGHT VENTRICLE             IVC RV S prime:     11.40 cm/s  IVC diam: 2.30 cm TAPSE (M-mode): 0.9 cm LEFT ATRIUM            Index LA Vol (A4C): 34.2 ml 19.46 ml/m  AORTIC VALVE LVOT Vmax:   101.00 cm/s LVOT Vmean:  67.600 cm/s LVOT VTI:    0.135 m  AORTA Ao Root diam: 3.10 cm Ao Asc diam:  3.20 cm  SHUNTS Systemic VTI:  0.14 m Systemic Diam: 2.20 cm Arvilla Meres MD Electronically signed by Arvilla Meres MD Signature Date/Time: 09/08/2023/1:46:51 PM    Final    CT Angio Chest Pulmonary Embolism (PE) W or WO Contrast  Result Date: 09/08/2023 CLINICAL DATA:  Shortness of breath, hypoxia, tachycardia. Hodgkin lymphoma EXAM: CT ANGIOGRAPHY CHEST WITH CONTRAST TECHNIQUE: Multidetector CT imaging of the chest was performed using the standard protocol during bolus administration of intravenous contrast. Multiplanar CT image reconstructions and MIPs were obtained to evaluate the vascular anatomy. RADIATION DOSE REDUCTION: This exam was performed according to the departmental dose-optimization program which includes automated exposure control, adjustment of the mA and/or kV according to patient size and/or use of iterative reconstruction technique. CONTRAST:  75mL OMNIPAQUE IOHEXOL 350 MG/ML SOLN COMPARISON:  PET-CT 06/15/2023 FINDINGS: Despite efforts by the technologist and patient, motion artifact is present on today's exam and could not be eliminated. This reduces exam sensitivity and specificity. Cardiovascular: Prominent main pulmonary artery at 3.6 cm diameter on image 153 of series 11 suggesting pulmonary arterial hypertension. No filling defect is identified in the pulmonary arterial tree to suggest pulmonary embolus. Increased pericardial effusion, previously mild and currently moderate to prominent. Moderate cardiomegaly. Mediastinum/Nodes: Low-grade edema/stranding in the mediastinum. No observed pathologic adenopathy. Lungs/Pleura: Right anterior pleural calcification on image 57 series 5, unchanged from prior exams. Moderate left and small right pleural effusion. Emphysema. Patchy chronic airspace opacities especially in  the right lower lobe and left upper lobe, mildly increased interstitial accentuation and patchy ground-glass opacities in a mosaic attenuation pattern potentially from pulmonary edema, atypical pneumonia, or hypersensitivity pneumonitis. Some of the chronic airspace opacities could be therapy related. Upper Abdomen: Unremarkable Musculoskeletal: Unremarkable Review of the MIP images confirms the above findings. IMPRESSION: 1. No filling defect is identified in the pulmonary arterial tree to suggest pulmonary embolus. 2. Increased pericardial effusion, previously mild and currently moderate to prominent. Correlate with clinical indicators such as Beck's triad in assessing for tamponade, echocardiography may be warranted. 3. Moderate left and small right pleural effusion. 4. Patchy chronic airspace opacities especially in the right lower lobe and left upper lobe, mildly increased interstitial accentuation and patchy ground-glass opacities in a mosaic attenuation pattern potentially from pulmonary edema, atypical pneumonia, or hypersensitivity pneumonitis. Some of the chronic airspace opacities could be therapy related. 5. Prominent main pulmonary artery at 3.6 cm diameter suggesting pulmonary arterial hypertension. Critical Value/emergent results were called by telephone at the time of interpretation on 09/08/2023 at 12:32 pm to provider [facility] Leveon Pelzer , who verbally acknowledged these results. Electronically Signed   By: Gaylyn Rong M.D.   On: 09/08/2023 12:35   DG Chest Port 1 View  Result Date: 09/08/2023 CLINICAL DATA:  Shortness of breath EXAM: PORTABLE CHEST 1 VIEW COMPARISON:  Chest radiograph 08/22/2023, same-day CTA chest FINDINGS: The left chest wall port is stable in position terminating in the region of the cavoatrial junction. The heart is enlarged, unchanged. There is unchanged volume  loss in the right hemithorax. There is vascular congestion and mild pulmonary interstitial edema. Additional  airspace opacities in both lungs are also noted common better seen on the same-day CT chest. There is no pneumothorax There is no acute osseous abnormality. IMPRESSION: 1. Cardiomegaly with small bilateral pleural effusions and pulmonary interstitial edema. 2. Unchanged volume loss in the right hemithorax. Additional patchy airspace opacities in both lungs better seen on the same-day CT chest. Electronically Signed   By: Lesia Hausen M.D.   On: 09/08/2023 11:44    Procedures Procedures    Medications Ordered in ED Medications  acetaminophen (TYLENOL) tablet 650 mg (has no administration in time range)    Or  acetaminophen (TYLENOL) suppository 650 mg (has no administration in time range)  traZODone (DESYREL) tablet 25 mg (has no administration in time range)  ondansetron (ZOFRAN) tablet 4 mg (has no administration in time range)    Or  ondansetron (ZOFRAN) injection 4 mg (has no administration in time range)  albuterol (PROVENTIL) (2.5 MG/3ML) 0.083% nebulizer solution 2.5 mg (has no administration in time range)  ceFEPIme (MAXIPIME) 2 g in sodium chloride 0.9 % 100 mL IVPB (has no administration in time range)  vancomycin (VANCOCIN) IVPB 1000 mg/200 mL premix (has no administration in time range)  lactated ringers bolus 1,000 mL (0 mLs Intravenous Stopped 09/08/23 1350)  ceFEPIme (MAXIPIME) 2 g in sodium chloride 0.9 % 100 mL IVPB (0 g Intravenous Stopped 09/08/23 1142)  vancomycin (VANCOREADY) IVPB 1500 mg/300 mL (0 mg Intravenous Stopped 09/08/23 1350)  iohexol (OMNIPAQUE) 350 MG/ML injection 75 mL (75 mLs Intravenous Contrast Given 09/08/23 1117)  heparin lock flush 100 unit/mL (500 Units Intracatheter Given 09/08/23 1600)    ED Course/ Medical Decision Making/ A&P Clinical Course as of 09/08/23 1635  Thu Sep 08, 2023  1139 EKG sinus tach, no signs of acute ischemia or arrhythmia. [JD]  1230 Radiology: No PE, chronic patchy opacities and possible pulmonary edema or pneumonitis,  pericardial effusion [JD]    Clinical Course User Index [JD] Laurence Spates, MD                                 Medical Decision Making Amount and/or Complexity of Data Reviewed Labs: ordered. Radiology: ordered. ECG/medicine tests: ordered.  Risk Prescription drug management. Decision regarding hospitalization.   Medical Decision Making:   PRETTY DILEO is a 37 y.o. female who presented to the ED today with hypoxia, tachycardia, shortness of breath.  Here she is tachycardic, stable on 2 L of oxygen which is new.  She is having slightly elevated temperature, and is on chemotherapy.  Differential including pneumonia, pleural effusion, sepsis, viral infection.  Consider PE as well.  Positive antibiotics ordered per sepsis protocol as well as IV fluids.   Patient placed on continuous vitals and telemetry monitoring while in ED which was reviewed periodically.  Reviewed and confirmed nursing documentation for past medical history, family history, social history.  Reassessment and Plan:   Labwork reviewed, lactic acid is normal.  She already had a CBC notable for significant leukocytosis.  CMP was unremarkable.  Here she has possible urinary tract infection with significant bacteriuria although only a few pyuria.  Asymptomatic from the standpoint.  Abdominal exam is benign.  CTA chest shows no signs of PE, does show some possible pulmonary edema as well as bilateral pleural effusion and pericardial effusion.  I did a bedside echo  that did not show any signs of tamponade and clinically does not seem to be in tamponade.  Stat echo was obtained.  Shows small to moderate pericardial effusion without signs of tamponade.  Discussed with Trish with cardiology who did not recommend any acute intervention for her pericardial effusion.  However given her significant pleural effusion as well as new ox requirement and concern for possible sepsis I discussed the patient with the hospitalist and she  was admitted.   Patient's presentation is most consistent with acute presentation with potential threat to life or bodily function.           Final Clinical Impression(s) / ED Diagnoses Final diagnoses:  Hypoxia    Rx / DC Orders ED Discharge Orders     None         Laurence Spates, MD 09/08/23 1635

## 2023-09-08 NOTE — Progress Notes (Signed)
Pharmacy Antibiotic Note  Heidi [name] is a 37 y.o. female with Hodgkin's lymphoma on chemotherapy PTA who presented to the ED on 09/08/2023 with SOB.  Chest CT came back neg for PE, showed increased pericardial effusion, bilateral pleural effusion and patchy chronic airspace opacities especially in the right lower lobe and left upper lobe.  Pharmacy has been consulted to dose vancomycin and cefepime for suspected sepsis and possible PNA.  Today, 09/08/2023: - wbc 35.4; pegfilgrastim 6mg  x1 given on 09/03/23 - ANC 29.6 - scr 0.79 (crcl~94)   Plan: - vancomycin 1500 mg IV x 1 given at 11:38a in the ED, then 1000 mg q12h for est AUC 520 - cefepime 2gm q8h  _________________________________________  Temp (24hrs), Avg:99.4 F (37.4 C), Min:99.3 F (37.4 C), Max:99.4 F (37.4 C)  Recent Labs  Lab 09/08/23 0922 09/08/23 1042  WBC 35.4*  --   CREATININE 0.79 0.70  LATICACIDVEN  --  1.8    Estimated Creatinine Clearance: 93.6 mL/min (by C-G formula based on SCr of 0.7 mg/dL).    Allergies  Allergen Reactions   Doxorubicin Hcl Liposomal Anaphylaxis and Shortness Of Breath    Pt had severe hypersensitivity reaction with chest pain and SOB immediately following start of medication. See hypersensitivity note from 07/14/2023.     Thank you for allowing pharmacy to be a part of this patient's care.  Lucia Gaskins 09/08/2023 1:59 PM

## 2023-09-08 NOTE — ED Notes (Signed)
Pt leaving AMA, long discussion of the risks with patient leaving before discharged by provider, spoke with provider as well and he advised he discussed risks with patient as well. Pt states she understood the risk and still wanted to leave, AMA form signed. Provider aware of AMA.

## 2023-09-08 NOTE — ED Notes (Signed)
Pt transported for Thoracentesis

## 2023-09-08 NOTE — H&P (Signed)
History and Physical  Heidi [name] MWU:[id number] DOB: [birth year] DOA: 09/08/2023  PCP: Pcp, No   Chief Complaint: Shortness of breath  HPI: Heidi [name] is a 37 y.o. female with medical history significant for mixed cellularity Hodgkin's lymphoma followed by Dr. Candise Che who is being admitted to the hospital with acute hypoxic respiratory failure with a moderate left pleural effusion of unclear etiology.  Patient was seen 08/22/2023 at [facility] for shortness of breath, and left AMA.  She was seen again in the emergency department on 8/30 had a hemoglobin of 7.2 and was given 1 unit of blood after which she felt better.  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  She does have a history of noncompliance with treatment, was lost to follow-up for a couple of years, and also missed multiple appointments for her PET/CT and missed some of her chemotherapy infusions.  She was recently seen at oncology clinic on 9/4, treatment was held that day and received pRBC.  She did receive her chemotherapy on 9/5.  Patient states she received " injection for white blood cells" last week, but I see no documentation of this.  Today she followed up in oncology clinic and was noted to be ill-appearing and short of breath.  She was noted to be tachycardic heart rate 136, saturating 81% on room air.  She was placed on 2 L nasal cannula oxygen and oxygenation improved to 94%.  She was sent to the emergency department for evaluation.  Patient denies to me any nausea, chest pain, vomiting, fevers, chills, urgency or frequency of urination, or any dysuria.  ED Course: In the emergency department she has been afebrile heart rate as high as 128, saturating 97% on 2 L nasal cannula.  Lab work today shows WBC 35.4, hemoglobin 7.7, platelets 39,000.  Troponin 23.  CMP is essentially unremarkable.  CT scan was done as noted below, significant for moderate  left-sided pleural effusion, some pericardial effusion.  ER provider performed bedside echo which showed no evidence of tamponade physiology, and stat echo has been completed.  He is awaiting a callback from inpatient cardiology, in the meantime the patient was given IV vancomycin and cefepime, and hospitalist was contacted for admission.  Review of Systems: Please see HPI for pertinent positives and negatives. A complete 10 system review of systems are otherwise negative.  Past Medical History:  Diagnosis Date   ARDS (adult respiratory distress syndrome) (HCC)    Asthma    Hodgkin lymphoma (HCC)    Hypertension    Past Surgical History:  Procedure Laterality Date   AXILLARY LYMPH NODE BIOPSY Right 03/19/2016   Procedure: AXILLARY LYMPH NODE BIOPSY;  Surgeon: Darnell Level, MD;  Location: [facility];  Service: General;  Laterality: Right;   IR GENERIC HISTORICAL  12/22/2016   IR FLUORO GUIDE PORT INSERTION LEFT 12/22/2016 [facility]   IR GENERIC HISTORICAL  12/22/2016   IR US GUIDE VASC ACCESS LEFT 12/22/2016 [facility]   IR GENERIC HISTORICAL  12/22/2016   IR CV LINE INJECTION 12/22/2016 [facility]   IR GENERIC HISTORICAL  12/22/2016   IR REMOVAL TUN ACCESS W/ PORT W/O FL MOD SED 12/22/2016 [facility]   VIDEO BRONCHOSCOPY Bilateral 11/26/2016   Procedure: VIDEO BRONCHOSCOPY WITH FLUORO;  Surgeon: Oretha Milch, MD;  Location: [facility];  Service: Cardiopulmonary;  Laterality: Bilateral;    Social History:  reports that she has been smoking  cigarettes. She started smoking about 22 years ago. She has a 7.5 pack-year smoking history. She has never used smokeless tobacco. She reports current alcohol use. She reports that she does not currently use drugs.   Allergies  Allergen Reactions   Doxorubicin Hcl Liposomal Anaphylaxis and Shortness Of Breath    Pt had severe hypersensitivity reaction with chest pain and SOB immediately following start of medication. See hypersensitivity  note from 07/14/2023.    History reviewed. No pertinent family history.   Prior to Admission medications   Medication Sig Start Date End Date Taking? Authorizing Provider  furosemide (LASIX) 20 MG tablet Take 1 tablet (20 mg total) by mouth daily for 5 days. 07/14/23 07/19/23  Namon Cirri E, PA-C  gabapentin (NEURONTIN) 300 MG capsule Take 1 capsule (300 mg total) by mouth at bedtime. Patient not taking: Reported on 08/11/2023 05/27/23   [name], MD  HYDROcodone-acetaminophen (NORCO/VICODIN) 5-325 MG tablet Take 1 tablet by mouth every 6 (six) hours as needed. Patient not taking: Reported on 08/11/2023 07/09/23   Barrie Dunker B, PA-C  lidocaine (LIDODERM) 5 % Place 1 patch onto the skin daily. Remove & Discard patch within 12 hours or as directed by MD Patient not taking: Reported on 08/11/2023 03/29/23   Prosperi, Christian H, PA-C  lidocaine-prilocaine (EMLA) cream Apply to affected area once Patient not taking: Reported on 08/11/2023 06/30/23   [name], MD  ondansetron (ZOFRAN) 8 MG tablet Take 1 tablet (8 mg total) by mouth every 8 (eight) hours as needed for nausea or vomiting. 06/30/23   [name], MD  prochlorperazine (COMPAZINE) 10 MG tablet Take 1 tablet (10 mg total) by mouth every 6 (six) hours as needed for nausea or vomiting. 06/30/23   [name], MD    Physical Exam: BP (!) 139/90   Pulse (!) 121   Temp 99.4 F (37.4 C) (Oral)   Resp (!) 25   SpO2 (!) 89%   General:  Alert, oriented, calm, in no acute distress, she is slightly tachypneic but speaking full sentences.  Sitting upright, wearing 2 L nasal cannula oxygen.  No cough. Eyes: EOMI, clear conjuctivae, white sclerea Neck: supple, no masses, trachea mildline  Cardiovascular: RRR, no murmurs or rubs, no peripheral edema  Respiratory: Diminished on the left side, from the mid lung zone to the base of the lungs.  Breath sounds are diminished at the left base.  No wheezing, or  rhonchi. Abdomen: Soft, nontender, nondistended, normal bowel tones heard  Skin: dry, no rashes  Musculoskeletal: no joint effusions, normal range of motion  Psychiatric: appropriate affect, normal speech  Neurologic: extraocular muscles intact, clear speech, moving all extremities with intact sensorium         Labs on Admission:  Basic Metabolic Panel: Recent Labs  Lab 09/08/23 0922 09/08/23 1042  NA 136 134*  K 3.8 3.5  CL 99 96*  CO2 29  --   GLUCOSE 127* 156*  BUN 7 5*  CREATININE 0.79 0.70  CALCIUM 9.0  --    Liver Function Tests: Recent Labs  Lab 09/08/23 0922  AST 14*  ALT 10  ALKPHOS 141*  BILITOT 1.2  PROT 6.3*  ALBUMIN 4.0   No results for input(s): "LIPASE", "AMYLASE" in the last 168 hours. No results for input(s): "AMMONIA" in the last 168 hours. CBC: Recent Labs  Lab 09/08/23 0922 09/08/23 1042  WBC 35.4*  --   NEUTROABS 29.6*  --   HGB 7.7* 7.8*  HCT 22.9* 23.0*  MCV 89.1  --   PLT 39*  --    Cardiac Enzymes: No results for input(s): "CKTOTAL", "CKMB", "CKMBINDEX", "TROPONINI" in the last 168 hours.  BNP (last 3 results) No results for input(s): "BNP" in the last 8760 hours.  ProBNP (last 3 results) No results for input(s): "PROBNP" in the last 8760 hours.  CBG: No results for input(s): "GLUCAP" in the last 168 hours.  Radiological Exams on Admission: CT Angio Chest Pulmonary Embolism (PE) W or WO Contrast  Result Date: 09/08/2023 CLINICAL DATA:  Shortness of breath, hypoxia, tachycardia. Hodgkin lymphoma EXAM: CT ANGIOGRAPHY CHEST WITH CONTRAST TECHNIQUE: Multidetector CT imaging of the chest was performed using the standard protocol during bolus administration of intravenous contrast. Multiplanar CT image reconstructions and MIPs were obtained to evaluate the vascular anatomy. RADIATION DOSE REDUCTION: This exam was performed according to the departmental dose-optimization program which includes automated exposure control, adjustment of  the mA and/or kV according to patient size and/or use of iterative reconstruction technique. CONTRAST:  75mL OMNIPAQUE IOHEXOL 350 MG/ML SOLN COMPARISON:  PET-CT 06/15/2023 FINDINGS: Despite efforts by the technologist and patient, motion artifact is present on today's exam and could not be eliminated. This reduces exam sensitivity and specificity. Cardiovascular: Prominent main pulmonary artery at 3.6 cm diameter on image 153 of series 11 suggesting pulmonary arterial hypertension. No filling defect is identified in the pulmonary arterial tree to suggest pulmonary embolus. Increased pericardial effusion, previously mild and currently moderate to prominent. Moderate cardiomegaly. Mediastinum/Nodes: Low-grade edema/stranding in the mediastinum. No observed pathologic adenopathy. Lungs/Pleura: Right anterior pleural calcification on image 57 series 5, unchanged from prior exams. Moderate left and small right pleural effusion. Emphysema. Patchy chronic airspace opacities especially in the right lower lobe and left upper lobe, mildly increased interstitial accentuation and patchy ground-glass opacities in a mosaic attenuation pattern potentially from pulmonary edema, atypical pneumonia, or hypersensitivity pneumonitis. Some of the chronic airspace opacities could be therapy related. Upper Abdomen: Unremarkable Musculoskeletal: Unremarkable Review of the MIP images confirms the above findings. IMPRESSION: 1. No filling defect is identified in the pulmonary arterial tree to suggest pulmonary embolus. 2. Increased pericardial effusion, previously mild and currently moderate to prominent. Correlate with clinical indicators such as Beck's triad in assessing for tamponade, echocardiography may be warranted. 3. Moderate left and small right pleural effusion. 4. Patchy chronic airspace opacities especially in the right lower lobe and left upper lobe, mildly increased interstitial accentuation and patchy ground-glass opacities  in a mosaic attenuation pattern potentially from pulmonary edema, atypical pneumonia, or hypersensitivity pneumonitis. Some of the chronic airspace opacities could be therapy related. 5. Prominent main pulmonary artery at 3.6 cm diameter suggesting pulmonary arterial hypertension. Critical Value/emergent results were called by telephone at the time of interpretation on 09/08/2023 at 12:32 pm to provider [facility] DAVIS , who verbally acknowledged these results. Electronically Signed   By: Gaylyn Rong M.D.   On: 09/08/2023 12:35   DG Chest Port 1 View  Result Date: 09/08/2023 CLINICAL DATA:  Shortness of breath EXAM: PORTABLE CHEST 1 VIEW COMPARISON:  Chest radiograph 08/22/2023, same-day CTA chest FINDINGS: The left chest wall port is stable in position terminating in the region of the cavoatrial junction. The heart is enlarged, unchanged. There is unchanged volume loss in the right hemithorax. There is vascular congestion and mild pulmonary interstitial edema. Additional airspace opacities in both lungs are also noted common better seen on the same-day CT chest. There is no pneumothorax There is no acute  osseous abnormality. IMPRESSION: 1. Cardiomegaly with small bilateral pleural effusions and pulmonary interstitial edema. 2. Unchanged volume loss in the right hemithorax. Additional patchy airspace opacities in both lungs better seen on the same-day CT chest. Electronically Signed   By: Lesia Hausen M.D.   On: 09/08/2023 11:44    STAT Echo 09/08/23: 1. Left ventricular ejection fraction, by estimation, is 65 to 70%. The  left ventricle has normal function. The left ventricle has no regional  wall motion abnormalities. Left ventricular diastolic parameters are  indeterminate. There is the  interventricular septum is flattened in systole, consistent with right  ventricular pressure overload.   2. Right ventricular systolic function is moderately reduced. The right  ventricular size is normal.  Tricuspid regurgitation signal is inadequate  for assessing PA pressure.   3. Small to moderate circumfrential pericardial effusion. The pericardial  effusion is circumferential.   4. The mitral valve is normal in structure. Trivial mitral valve  regurgitation. No evidence of mitral stenosis.   5. The aortic valve is tricuspid. Aortic valve regurgitation is not  visualized. No aortic stenosis is present.   6. The inferior vena cava is dilated in size with <50% respiratory  variability, suggesting right atrial pressure of 15 mmHg.   Assessment/Plan Heidi [name] is a 37 y.o. female with medical history significant for mixed cellularity Hodgkin's lymphoma followed by Dr. Candise Che who is being admitted to the hospital with acute hypoxic respiratory failure with a moderate left pleural effusion of unclear etiology.   Acute hypoxic respiratory failure-in the setting of bilateral pleural effusion, and history of intrathoracic malignancy requiring palliative radiation.  Bacterial pneumonia cannot be ruled out at this time. -Inpatient admission to progressive -Continue empiric IV vancomycin and IV cefepime -Continue supplemental oxygen  Moderate left-sided pleural effusion-of unclear etiology, may be infectious versus malignant -IR thoracentesis ordered, with appropriate lab studies  Leukocytosis-she did have pegfilgrastim injection on 9/7 so this may be etiology of her leukocytosis  Pericardial effusion-unclear etiology differential is broad including chemotherapy effect, malignancy, etc.  She is tachycardic but hemodynamically stable without hypotension.  Bedside echo performed by EDP without evidence of obvious tamponade physiology, Echo as above confirms.  -Echo as above, anticipate formal cardiology evaluation/recommendations to come  Hodgkin's lymphoma-Dr. Candise Che added to treatment team  Thrombocytopenia-without evidence of bleeding, likely due to her recent chemotherapy  Anemia-due to  chemotherapy and her active malignancy, status post multiple blood transfusions in the recent past.  Currently not meeting criteria for blood transfusion but will defer to oncology.  DVT prophylaxis: None due to thrombocytopenia     Code Status: Full Code  Consults called: ERMD consulted cardiology, Dr. Candise Che added to treatment team.  Admission status: The appropriate patient status for this patient is INPATIENT. Inpatient status is judged to be reasonable and necessary in order to provide the required intensity of service to ensure the patient's safety. The patient's presenting symptoms, physical exam findings, and initial radiographic and laboratory data in the context of their chronic comorbidities is felt to place them at high risk for further clinical deterioration. Furthermore, it is not anticipated that the patient will be medically stable for discharge from the hospital within 2 midnights of admission.    I certify that at the point of admission it is my clinical judgment that the patient will require inpatient hospital care spanning beyond 2 midnights from the point of admission due to high intensity of service, high risk for further deterioration and high frequency of surveillance  required  Time spent: 58 minutes  Tawnie Ehresman Sharlette Dense MD [facility] Pager [phone]  If 7PM-7AM, please contact night-coverage [email or web address] Password [email or web address]  09/08/2023, 1:23 PM

## 2023-09-08 NOTE — ED Notes (Signed)
Pt refused sticks for blood cultures, so 1 culture was taken from port.

## 2023-09-08 NOTE — ED Notes (Signed)
ED TO INPATIENT HANDOFF REPORT  ED Nurse Name and Phone #: Delice Bison, RN  S Name/Age/Gender Heidi [name] 37 y.o. female Room/Bed: RESB/RESB  Code Status   Code Status: Full Code  Home/SNF/Other Home Patient oriented to: self, place, time, and situation Is this baseline? Yes   Triage Complete: Triage complete  Chief Complaint Acute hypoxic respiratory failure (HCC) [J96.01]  Triage Note Pt coming from the [facility] c/o sob. Upon arrival there staff states pt pulse ox was 81% on RA. Pt was tachy as well. Pt was placed on [place] 2L with pulse ox increasing to 96%. Pt states she has had a non-productive cough for the past few days. Pt denies any chest pain.   Allergies Allergies  Allergen Reactions   Doxorubicin Hcl Liposomal Anaphylaxis and Shortness Of Breath    Pt had severe hypersensitivity reaction with chest pain and SOB immediately following start of medication. See hypersensitivity note from 07/14/2023.    Level of Care/Admitting Diagnosis ED Disposition     ED Disposition  Admit   Condition  --   Comment  Hospital Area: [facility] [[id number]]  Level of Care: Progressive [102]  Admit to Progressive based on following criteria: RESPIRATORY PROBLEMS hypoxemic/hypercapnic respiratory failure that is responsive to NIPPV (BiPAP) or High Flow Nasal Cannula (6-80 lpm). Frequent assessment/intervention, no > Q2 hrs < Q4 hrs, to maintain oxygenation and pulmonary hygiene.  May admit patient to [facility] or [facility] if equivalent level of care is available:: Yes  Covid Evaluation: Asymptomatic - no recent exposure (last 10 days) testing not required  Diagnosis: Acute hypoxic respiratory failure [facility]) [[id number]]  Admitting Physician: Maryln Gottron [[id number]]  Attending Physician: Kirby Crigler, Parks Neptune [[id number]]  Certification:: I certify this patient will need inpatient services for at least 2 midnights  Expected Medical Readiness: 09/11/2023           B Medical/Surgery History Past Medical History:  Diagnosis Date   ARDS (adult respiratory distress syndrome) (HCC)    Asthma    Hodgkin lymphoma (HCC)    Hypertension    Past Surgical History:  Procedure Laterality Date   AXILLARY LYMPH NODE BIOPSY Right 03/19/2016   Procedure: AXILLARY LYMPH NODE BIOPSY;  Surgeon: Darnell Level, MD;  Location: [facility];  Service: General;  Laterality: Right;   IR GENERIC HISTORICAL  12/22/2016   IR FLUORO GUIDE PORT INSERTION LEFT 12/22/2016 [facility]   IR GENERIC HISTORICAL  12/22/2016   IR US GUIDE VASC ACCESS LEFT 12/22/2016 [facility]   IR GENERIC HISTORICAL  12/22/2016   IR CV LINE INJECTION 12/22/2016 [facility]   IR GENERIC HISTORICAL  12/22/2016   IR REMOVAL TUN ACCESS W/ PORT W/O FL MOD SED 12/22/2016 [facility]   VIDEO BRONCHOSCOPY Bilateral 11/26/2016   Procedure: VIDEO BRONCHOSCOPY WITH FLUORO;  Surgeon: Oretha Milch, MD;  Location: [facility];  Service: Cardiopulmonary;  Laterality: Bilateral;     A IV Location/Drains/Wounds Patient Lines/Drains/Airways Status     Active Line/Drains/Airways     Name Placement date Placement time Site Days   Implanted Port 10/20/18 Left Chest 10/20/18  1911  Chest  1784   Wound / Incision (Open or Dehisced) Other (Comment) Buttocks Left open skin-peeling, no drainage, healing. --  --  Buttocks  --            Intake/Output Last 24 hours No intake or output data in the 24 hours ending 09/08/23 1624  Labs/Imaging Results for orders placed or performed  during the hospital encounter of 09/08/23 (from the past 48 hour(s))  Protime-INR     Status: Abnormal   Collection Time: 09/08/23 10:31 AM  Result Value Ref Range   Prothrombin Time 16.1 (H) 11.4 - 15.2 seconds   INR 1.3 (H) 0.8 - 1.2    Comment: (NOTE) INR goal varies based on device and disease states. Performed at [facility], [address], [place], [place] [address]   APTT     Status: Abnormal    Collection Time: 09/08/23 10:31 AM  Result Value Ref Range   aPTT 41 (H) 24 - 36 seconds    Comment:        IF BASELINE aPTT IS ELEVATED, SUGGEST PATIENT RISK ASSESSMENT BE USED TO DETERMINE APPROPRIATE ANTICOAGULANT THERAPY. Performed at [facility], [address], [place], [place] [address]   hCG, serum, qualitative     Status: None   Collection Time: 09/08/23 10:31 AM  Result Value Ref Range   Preg, Serum NEGATIVE NEGATIVE    Comment:        THE SENSITIVITY OF THIS METHODOLOGY IS >10 mIU/mL. Performed at [facility], [address] [place], [place] [address]   Troponin I (High Sensitivity)     Status: Abnormal   Collection Time: 09/08/23 10:31 AM  Result Value Ref Range   Troponin I (High Sensitivity) 23 (H) <18 ng/L    Comment: (NOTE) Elevated high sensitivity troponin I (hsTnI) values and significant  changes across serial measurements may suggest ACS but many other  chronic and acute conditions are known to elevate hsTnI results.  Refer to the "Links" section for chest pain algorithms and additional  guidance. Performed at [facility], [address], [place], [place] [address]   Resp panel by RT-PCR (RSV, Flu A&B, Covid) Anterior Nasal Swab     Status: None   Collection Time: 09/08/23 10:32 AM   Specimen: Anterior Nasal Swab  Result Value Ref Range   SARS Coronavirus 2 by RT PCR NEGATIVE NEGATIVE    Comment: (NOTE) SARS-CoV-2 target nucleic acids are NOT DETECTED.  The SARS-CoV-2 RNA is generally detectable in upper respiratory specimens during the acute phase of infection. The lowest concentration of SARS-CoV-2 viral copies this assay can detect is 138 copies/mL. A negative result does not preclude SARS-Cov-2 infection and should not be used as the sole basis for treatment or other patient management decisions. A negative result may occur with  improper specimen collection/handling, submission of  specimen other than nasopharyngeal swab, presence of viral mutation(s) within the areas targeted by this assay, and inadequate number of viral copies(<138 copies/mL). A negative result must be combined with clinical observations, patient history, and epidemiological information. The expected result is Negative.  Fact Sheet for Patients:  BloggerCourse.com  Fact Sheet for Healthcare Providers:  SeriousBroker.it  This test is no t yet approved or cleared by the [place] FDA and  has been authorized for detection and/or diagnosis of SARS-CoV-2 by FDA under an Emergency Use Authorization (EUA). This EUA will remain  in effect (meaning this test can be used) for the duration of the COVID-19 declaration under Section 564(b)(1) of the Act, 21 U.S.C.section 360bbb-3(b)(1), unless the authorization is terminated  or revoked sooner.       Influenza A by PCR NEGATIVE NEGATIVE   Influenza B by PCR NEGATIVE NEGATIVE    Comment: (NOTE) The Xpert Xpress SARS-CoV-2/FLU/RSV plus assay is intended as an aid in the diagnosis  of influenza from Nasopharyngeal swab specimens and should not be used as a sole basis for treatment. Nasal washings and aspirates are unacceptable for Xpert Xpress SARS-CoV-2/FLU/RSV testing.  Fact Sheet for Patients: BloggerCourse.com  Fact Sheet for Healthcare Providers: SeriousBroker.it  This test is not yet approved or cleared by the [place] FDA and has been authorized for detection and/or diagnosis of SARS-CoV-2 by FDA under an Emergency Use Authorization (EUA). This EUA will remain in effect (meaning this test can be used) for the duration of the COVID-19 declaration under Section 564(b)(1) of the Act, 21 U.S.C. section 360bbb-3(b)(1), unless the authorization is terminated or revoked.     Resp Syncytial Virus by PCR NEGATIVE NEGATIVE    Comment:  (NOTE) Fact Sheet for Patients: BloggerCourse.com  Fact Sheet for Healthcare Providers: SeriousBroker.it  This test is not yet approved or cleared by the [place] FDA and has been authorized for detection and/or diagnosis of SARS-CoV-2 by FDA under an Emergency Use Authorization (EUA). This EUA will remain in effect (meaning this test can be used) for the duration of the COVID-19 declaration under Section 564(b)(1) of the Act, 21 U.S.C. section 360bbb-3(b)(1), unless the authorization is terminated or revoked.  Performed at [facility], [address], [place], [place] [address]   I-Stat Lactic Acid, ED     Status: None   Collection Time: 09/08/23 10:42 AM  Result Value Ref Range   Lactic Acid, Venous 1.8 0.5 - 1.9 mmol/L  I-stat chem 8, ED     Status: Abnormal   Collection Time: 09/08/23 10:42 AM  Result Value Ref Range   Sodium 134 (L) 135 - 145 mmol/L   Potassium 3.5 3.5 - 5.1 mmol/L   Chloride 96 (L) 98 - 111 mmol/L   BUN 5 (L) 6 - 20 mg/dL   Creatinine, Ser 0.73 0.44 - 1.00 mg/dL   Glucose, Bld 710 (H) 70 - 99 mg/dL    Comment: Glucose reference range applies only to samples taken after fasting for at least 8 hours.   Calcium, Ion 1.14 (L) 1.15 - 1.40 mmol/L   TCO2 24 22 - 32 mmol/L   Hemoglobin 7.8 (L) 12.0 - 15.0 g/dL   HCT 62.6 (L) 94.8 - 54.6 %  Urinalysis, w/ Reflex to Culture (Infection Suspected) -Urine, Clean Catch     Status: Abnormal   Collection Time: 09/08/23 11:32 AM  Result Value Ref Range   Specimen Source URINE, CLEAN CATCH    Color, Urine YELLOW YELLOW   APPearance CLEAR CLEAR   Specific Gravity, Urine 1.008 1.005 - 1.030   pH 7.0 5.0 - 8.0   Glucose, UA NEGATIVE NEGATIVE mg/dL   Hgb urine dipstick NEGATIVE NEGATIVE   Bilirubin Urine NEGATIVE NEGATIVE   Ketones, ur NEGATIVE NEGATIVE mg/dL   Protein, ur NEGATIVE NEGATIVE mg/dL   Nitrite NEGATIVE NEGATIVE   Leukocytes,Ua  TRACE (A) NEGATIVE   RBC / HPF 0-5 0 - 5 RBC/hpf   WBC, UA 6-10 0 - 5 WBC/hpf    Comment:        Reflex urine culture not performed if WBC <=10, OR if Squamous epithelial cells >5. If Squamous epithelial cells >5 suggest recollection.    Bacteria, UA MANY (A) NONE SEEN   Squamous Epithelial / HPF 0-5 0 - 5 /HPF    Comment: Performed at [facility], [address], [place], [place] [address]  Troponin I (High Sensitivity)     Status: Abnormal   Collection Time: 09/08/23  2:05 PM  Result Value Ref Range   Troponin I (High Sensitivity) 21 (H) <18 ng/L    Comment: (NOTE) Elevated high sensitivity troponin I (hsTnI) values and significant  changes across serial measurements may suggest ACS but many other  chronic and acute conditions are known to elevate hsTnI results.  Refer to the "Links" section for chest pain algorithms and additional  guidance. Performed at [facility], [address] [place], [place] [address]    ECHOCARDIOGRAM COMPLETE  Result Date: 09/08/2023    ECHOCARDIOGRAM REPORT   Patient Name:   Heidi [name] Date of Exam: 09/08/2023 Medical Rec #:  [id number]          Height:       67.0 in Accession #:    [id number]         Weight:       143.8 lb Date of Birth:  1985/12/28         BSA:          1.758 m Patient Age:    36 years           BP:           139/90 mmHg Patient Gender: F                  HR:           118 bpm. Exam Location:  Inpatient Procedure: 2D Echo, Cardiac Doppler and Color Doppler Indications:    Pericardial Effusion I31.3  History:        Patient has prior history of Echocardiogram examinations, most                 recent 07/04/2023.  Sonographer:    Harriette Bouillon RDCS Referring Phys: [id number] Elenor Quinones DAVIS IMPRESSIONS  1. Left ventricular ejection fraction, by estimation, is 65 to 70%. The left ventricle has normal function. The left ventricle has no regional wall motion abnormalities. Left ventricular diastolic  parameters are indeterminate. There is the interventricular septum is flattened in systole, consistent with right ventricular pressure overload.  2. Right ventricular systolic function is moderately reduced. The right ventricular size is normal. Tricuspid regurgitation signal is inadequate for assessing PA pressure.  3. Small to moderate circumfrential pericardial effusion. The pericardial effusion is circumferential.  4. The mitral valve is normal in structure. Trivial mitral valve regurgitation. No evidence of mitral stenosis.  5. The aortic valve is tricuspid. Aortic valve regurgitation is not visualized. No aortic stenosis is present.  6. The inferior vena cava is dilated in size with <50% respiratory variability, suggesting right atrial pressure of 15 mmHg. Conclusion(s)/Recommendation(s): Technically limited echo due to poor sound wave transmission. There is a small to moderate pericardial efussion with a dilated IVC. Doubt tamponade physiology but mitral inflow with respirometer not performed. Given pericardial effusion, RV dysfunction and IVC dialtion would consider evalaution for pulmonary HTN. FINDINGS  Left Ventricle: Left ventricular ejection fraction, by estimation, is 65 to 70%. The left ventricle has normal function. The left ventricle has no regional wall motion abnormalities. The left ventricular internal cavity size was normal in size. There is  no left ventricular hypertrophy. The interventricular septum is flattened in systole, consistent with right ventricular pressure overload. Left ventricular diastolic parameters are indeterminate. Right Ventricle: The right ventricular size is normal. Right vetricular wall thickness was not well visualized. Right ventricular systolic function is moderately reduced. Tricuspid regurgitation signal is inadequate for assessing PA pressure. Left Atrium: Left atrial  size was normal in size. Right Atrium: Right atrial size was normal in size. Pericardium: A  moderately sized pericardial effusion is present. The pericardial effusion is circumferential. Mitral Valve: The mitral valve is normal in structure. Trivial mitral valve regurgitation. No evidence of mitral valve stenosis. Tricuspid Valve: The tricuspid valve is not well visualized. Tricuspid valve regurgitation is trivial. No evidence of tricuspid stenosis. Aortic Valve: The aortic valve is tricuspid. Aortic valve regurgitation is not visualized. No aortic stenosis is present. Pulmonic Valve: The pulmonic valve was normal in structure. Pulmonic valve regurgitation is not visualized. No evidence of pulmonic stenosis. Aorta: The aortic root is normal in size and structure. Venous: The inferior vena cava is dilated in size with less than 50% respiratory variability, suggesting right atrial pressure of 15 mmHg. IAS/Shunts: No atrial level shunt detected by color flow Doppler.  LEFT VENTRICLE PLAX 2D LVIDd:         4.40 cm   Diastology LVIDs:         3.60 cm   LV e' lateral: 8.92 cm/s LV PW:         0.90 cm LV IVS:        0.80 cm LVOT diam:     2.20 cm LV SV:         51 LV SV Index:   29 LVOT Area:     3.80 cm  RIGHT VENTRICLE             IVC RV S prime:     11.40 cm/s  IVC diam: 2.30 cm TAPSE (M-mode): 0.9 cm LEFT ATRIUM           Index LA Vol (A4C): 34.2 ml 19.46 ml/m  AORTIC VALVE LVOT Vmax:   101.00 cm/s LVOT Vmean:  67.600 cm/s LVOT VTI:    0.135 m  AORTA Ao Root diam: 3.10 cm Ao Asc diam:  3.20 cm  SHUNTS Systemic VTI:  0.14 m Systemic Diam: 2.20 cm Arvilla Meres MD Electronically signed by Arvilla Meres MD Signature Date/Time: 09/08/2023/1:46:51 PM    Final    CT Angio Chest Pulmonary Embolism (PE) W or WO Contrast  Result Date: 09/08/2023 CLINICAL DATA:  Shortness of breath, hypoxia, tachycardia. Hodgkin lymphoma EXAM: CT ANGIOGRAPHY CHEST WITH CONTRAST TECHNIQUE: Multidetector CT imaging of the chest was performed using the standard protocol during bolus administration of intravenous contrast.  Multiplanar CT image reconstructions and MIPs were obtained to evaluate the vascular anatomy. RADIATION DOSE REDUCTION: This exam was performed according to the departmental dose-optimization program which includes automated exposure control, adjustment of the mA and/or kV according to patient size and/or use of iterative reconstruction technique. CONTRAST:  75mL OMNIPAQUE IOHEXOL 350 MG/ML SOLN COMPARISON:  PET-CT 06/15/2023 FINDINGS: Despite efforts by the technologist and patient, motion artifact is present on today's exam and could not be eliminated. This reduces exam sensitivity and specificity. Cardiovascular: Prominent main pulmonary artery at 3.6 cm diameter on image 153 of series 11 suggesting pulmonary arterial hypertension. No filling defect is identified in the pulmonary arterial tree to suggest pulmonary embolus. Increased pericardial effusion, previously mild and currently moderate to prominent. Moderate cardiomegaly. Mediastinum/Nodes: Low-grade edema/stranding in the mediastinum. No observed pathologic adenopathy. Lungs/Pleura: Right anterior pleural calcification on image 57 series 5, unchanged from prior exams. Moderate left and small right pleural effusion. Emphysema. Patchy chronic airspace opacities especially in the right lower lobe and left upper lobe, mildly increased interstitial accentuation and patchy ground-glass opacities in a mosaic attenuation pattern potentially from pulmonary  edema, atypical pneumonia, or hypersensitivity pneumonitis. Some of the chronic airspace opacities could be therapy related. Upper Abdomen: Unremarkable Musculoskeletal: Unremarkable Review of the MIP images confirms the above findings. IMPRESSION: 1. No filling defect is identified in the pulmonary arterial tree to suggest pulmonary embolus. 2. Increased pericardial effusion, previously mild and currently moderate to prominent. Correlate with clinical indicators such as Beck's triad in assessing for tamponade,  echocardiography may be warranted. 3. Moderate left and small right pleural effusion. 4. Patchy chronic airspace opacities especially in the right lower lobe and left upper lobe, mildly increased interstitial accentuation and patchy ground-glass opacities in a mosaic attenuation pattern potentially from pulmonary edema, atypical pneumonia, or hypersensitivity pneumonitis. Some of the chronic airspace opacities could be therapy related. 5. Prominent main pulmonary artery at 3.6 cm diameter suggesting pulmonary arterial hypertension. Critical Value/emergent results were called by telephone at the time of interpretation on 09/08/2023 at 12:32 pm to provider [facility] DAVIS , who verbally acknowledged these results. Electronically Signed   By: Gaylyn Rong M.D.   On: 09/08/2023 12:35   DG Chest Port 1 View  Result Date: 09/08/2023 CLINICAL DATA:  Shortness of breath EXAM: PORTABLE CHEST 1 VIEW COMPARISON:  Chest radiograph 08/22/2023, same-day CTA chest FINDINGS: The left chest wall port is stable in position terminating in the region of the cavoatrial junction. The heart is enlarged, unchanged. There is unchanged volume loss in the right hemithorax. There is vascular congestion and mild pulmonary interstitial edema. Additional airspace opacities in both lungs are also noted common better seen on the same-day CT chest. There is no pneumothorax There is no acute osseous abnormality. IMPRESSION: 1. Cardiomegaly with small bilateral pleural effusions and pulmonary interstitial edema. 2. Unchanged volume loss in the right hemithorax. Additional patchy airspace opacities in both lungs better seen on the same-day CT chest. Electronically Signed   By: Lesia Hausen M.D.   On: 09/08/2023 11:44    Pending Labs Unresulted Labs (From admission, onward)     Start     Ordered   09/09/23 0500  Basic metabolic panel  Tomorrow morning,   R        09/08/23 1323   09/09/23 0500  CBC  Tomorrow morning,   R        09/08/23  1323   09/08/23 1321  HIV Antibody (routine testing w rflx)  (HIV Antibody (Routine testing w reflex) panel)  Once,   R        09/08/23 1323   09/08/23 1152  Brain natriuretic peptide  Add-on,   AD        09/08/23 1151   09/08/23 1031  Blood Culture (routine x 2)  (Septic presentation on arrival (screening labs, nursing and treatment orders for obvious sepsis))  BLOOD CULTURE X 2,   STAT      09/08/23 1032            Vitals/Pain Today's Vitals   09/08/23 1230 09/08/23 1300 09/08/23 1515 09/08/23 1540  BP: (!) 139/90 128/89  (!) 140/84  Pulse: (!) 121 (!) 118 (!) 124 (!) 113  Resp: (!) 25 (!) 24 20 (!) 29  Temp:    99.4 F (37.4 C)  TempSrc:    Oral  SpO2: (!) 89% 93% 94% 95%  PainSc:        Isolation Precautions No active isolations  Medications Medications  acetaminophen (TYLENOL) tablet 650 mg (has no administration in time range)    Or  acetaminophen (TYLENOL) suppository 650 mg (  has no administration in time range)  traZODone (DESYREL) tablet 25 mg (has no administration in time range)  ondansetron (ZOFRAN) tablet 4 mg (has no administration in time range)    Or  ondansetron (ZOFRAN) injection 4 mg (has no administration in time range)  albuterol (PROVENTIL) (2.5 MG/3ML) 0.083% nebulizer solution 2.5 mg (has no administration in time range)  ceFEPIme (MAXIPIME) 2 g in sodium chloride 0.9 % 100 mL IVPB (has no administration in time range)  vancomycin (VANCOCIN) IVPB 1000 mg/200 mL premix (has no administration in time range)  lactated ringers bolus 1,000 mL (0 mLs Intravenous Stopped 09/08/23 1350)  ceFEPIme (MAXIPIME) 2 g in sodium chloride 0.9 % 100 mL IVPB (0 g Intravenous Stopped 09/08/23 1142)  vancomycin (VANCOREADY) IVPB 1500 mg/300 mL (0 mg Intravenous Stopped 09/08/23 1350)  iohexol (OMNIPAQUE) 350 MG/ML injection 75 mL (75 mLs Intravenous Contrast Given 09/08/23 1117)  heparin lock flush 100 unit/mL (500 Units Intracatheter Given 09/08/23 1600)     Mobility walks     Focused Assessments Pulmonary Assessment Handoff:  Lung sounds:   O2 Device: Nasal Cannula O2 Flow Rate (L/min): (S) 2 L/min    R Recommendations: See Admitting Provider Note  Report given to:   Additional Notes:

## 2023-09-08 NOTE — ED Notes (Signed)
Pt got up to leave in a wheelchair and immediately began tripoding and unable to catch her breath. Pt returned back to the bed and placed back on oxygen and able to catch her breath. Pt realized she needed to stay and agreed to be admitted overnight. Provider notified.

## 2023-09-08 NOTE — Progress Notes (Signed)
Patient in appears in distress; pulse ox 91 on 3 liters; patient heart rate in 130;s; patient working to breathe notified doctor; waiting response; notified nurse in handoff

## 2023-09-09 ENCOUNTER — Other Ambulatory Visit: Payer: Self-pay

## 2023-09-09 ENCOUNTER — Encounter (HOSPITAL_COMMUNITY): Payer: Self-pay

## 2023-09-09 ENCOUNTER — Encounter: Payer: Self-pay | Admitting: Hematology

## 2023-09-09 ENCOUNTER — Emergency Department (HOSPITAL_COMMUNITY): Payer: Medicaid Other

## 2023-09-09 ENCOUNTER — Observation Stay (HOSPITAL_COMMUNITY)
Admission: EM | Admit: 2023-09-09 | Discharge: 2023-09-10 | Discharge status: Against Medical Advice | Payer: Medicaid Other | Attending: Internal Medicine | Admitting: Internal Medicine

## 2023-09-09 DIAGNOSIS — F17210 Nicotine dependence, cigarettes, uncomplicated: Secondary | ICD-10-CM | POA: Diagnosis present

## 2023-09-09 DIAGNOSIS — J9601 Acute respiratory failure with hypoxia: Secondary | ICD-10-CM | POA: Diagnosis not present

## 2023-09-09 DIAGNOSIS — J45909 Unspecified asthma, uncomplicated: Secondary | ICD-10-CM | POA: Insufficient documentation

## 2023-09-09 DIAGNOSIS — D696 Thrombocytopenia, unspecified: Secondary | ICD-10-CM | POA: Diagnosis present

## 2023-09-09 DIAGNOSIS — J3489 Other specified disorders of nose and nasal sinuses: Secondary | ICD-10-CM | POA: Insufficient documentation

## 2023-09-09 DIAGNOSIS — D6481 Anemia due to antineoplastic chemotherapy: Secondary | ICD-10-CM | POA: Diagnosis present

## 2023-09-09 DIAGNOSIS — Z79899 Other long term (current) drug therapy: Secondary | ICD-10-CM

## 2023-09-09 DIAGNOSIS — R55 Syncope and collapse: Secondary | ICD-10-CM | POA: Insufficient documentation

## 2023-09-09 DIAGNOSIS — D649 Anemia, unspecified: Secondary | ICD-10-CM | POA: Diagnosis present

## 2023-09-09 DIAGNOSIS — Z1152 Encounter for screening for COVID-19: Secondary | ICD-10-CM

## 2023-09-09 DIAGNOSIS — J90 Pleural effusion, not elsewhere classified: Secondary | ICD-10-CM | POA: Diagnosis present

## 2023-09-09 DIAGNOSIS — T451X5A Adverse effect of antineoplastic and immunosuppressive drugs, initial encounter: Secondary | ICD-10-CM | POA: Diagnosis present

## 2023-09-09 DIAGNOSIS — D72829 Elevated white blood cell count, unspecified: Secondary | ICD-10-CM | POA: Diagnosis present

## 2023-09-09 DIAGNOSIS — I3139 Other pericardial effusion (noninflammatory): Secondary | ICD-10-CM | POA: Diagnosis present

## 2023-09-09 DIAGNOSIS — R000 Tachycardia, unspecified: Secondary | ICD-10-CM | POA: Insufficient documentation

## 2023-09-09 DIAGNOSIS — I10 Essential (primary) hypertension: Secondary | ICD-10-CM | POA: Diagnosis present

## 2023-09-09 DIAGNOSIS — Z91199 Patient's noncompliance with other medical treatment and regimen due to unspecified reason: Secondary | ICD-10-CM

## 2023-09-09 DIAGNOSIS — E876 Hypokalemia: Secondary | ICD-10-CM | POA: Diagnosis present

## 2023-09-09 DIAGNOSIS — Z888 Allergy status to other drugs, medicaments and biological substances status: Secondary | ICD-10-CM

## 2023-09-09 DIAGNOSIS — Z5329 Procedure and treatment not carried out because of patient's decision for other reasons: Secondary | ICD-10-CM | POA: Diagnosis present

## 2023-09-09 DIAGNOSIS — C8120 Mixed cellularity classical Hodgkin lymphoma, unspecified site: Secondary | ICD-10-CM | POA: Diagnosis present

## 2023-09-09 DIAGNOSIS — C8128 Mixed cellularity classical Hodgkin lymphoma, lymph nodes of multiple sites: Secondary | ICD-10-CM | POA: Diagnosis present

## 2023-09-09 LAB — CBC
HCT: 21.8 % — ABNORMAL LOW (ref 36.0–46.0)
HCT: 21.8 % — ABNORMAL LOW (ref 36.0–46.0)
Hemoglobin: 7 g/dL — ABNORMAL LOW (ref 12.0–15.0)
Hemoglobin: 7 g/dL — ABNORMAL LOW (ref 12.0–15.0)
MCH: 30.3 pg (ref 26.0–34.0)
MCH: 30.4 pg (ref 26.0–34.0)
MCHC: 32.1 g/dL (ref 30.0–36.0)
MCHC: 32.1 g/dL (ref 30.0–36.0)
MCV: 94.4 fL (ref 80.0–100.0)
MCV: 94.8 fL (ref 80.0–100.0)
Platelets: 38 10*3/uL — ABNORMAL LOW (ref 150–400)
Platelets: 42 10*3/uL — ABNORMAL LOW (ref 150–400)
RBC: 2.31 MIL/uL — ABNORMAL LOW (ref 3.87–5.11)
RBC: 2.3 MIL/uL — ABNORMAL LOW (ref 3.87–5.11)
RDW: 18.6 % — ABNORMAL HIGH (ref 11.5–15.5)
RDW: 18.7 % — ABNORMAL HIGH (ref 11.5–15.5)
WBC: 30.2 10*3/uL — ABNORMAL HIGH (ref 4.0–10.5)
WBC: 32.6 10*3/uL — ABNORMAL HIGH (ref 4.0–10.5)
nRBC: 0.3 % — ABNORMAL HIGH (ref 0.0–0.2)
nRBC: 0.3 % — ABNORMAL HIGH (ref 0.0–0.2)

## 2023-09-09 LAB — CBC WITH DIFFERENTIAL/PLATELET
Abs Immature Granulocytes: 0.3 10*3/uL — ABNORMAL HIGH (ref 0.00–0.07)
Band Neutrophils: 4 %
Basophils Absolute: 0 10*3/uL (ref 0.0–0.1)
Basophils Relative: 0 %
Eosinophils Absolute: 0.6 10*3/uL — ABNORMAL HIGH (ref 0.0–0.5)
Eosinophils Relative: 2 %
HCT: 22.3 % — ABNORMAL LOW (ref 36.0–46.0)
Hemoglobin: 7.2 g/dL — ABNORMAL LOW (ref 12.0–15.0)
Lymphocytes Relative: 2 %
Lymphs Abs: 0.6 10*3/uL — ABNORMAL LOW (ref 0.7–4.0)
MCH: 30.4 pg (ref 26.0–34.0)
MCHC: 32.3 g/dL (ref 30.0–36.0)
MCV: 94.1 fL (ref 80.0–100.0)
Metamyelocytes Relative: 1 %
Monocytes Absolute: 1.9 10*3/uL — ABNORMAL HIGH (ref 0.1–1.0)
Monocytes Relative: 6 %
Neutro Abs: 27.7 10*3/uL — ABNORMAL HIGH (ref 1.7–7.7)
Neutrophils Relative %: 85 %
Platelets: 52 10*3/uL — ABNORMAL LOW (ref 150–400)
RBC: 2.37 MIL/uL — ABNORMAL LOW (ref 3.87–5.11)
RDW: 18.6 % — ABNORMAL HIGH (ref 11.5–15.5)
WBC: 31.1 10*3/uL — ABNORMAL HIGH (ref 4.0–10.5)
nRBC: 0.3 % — ABNORMAL HIGH (ref 0.0–0.2)

## 2023-09-09 LAB — COMPREHENSIVE METABOLIC PANEL
ALT: 16 U/L (ref 0–44)
AST: 17 U/L (ref 15–41)
Albumin: 3.7 g/dL (ref 3.5–5.0)
Alkaline Phosphatase: 141 U/L — ABNORMAL HIGH (ref 38–126)
Anion gap: 11 (ref 5–15)
BUN: 7 mg/dL (ref 6–20)
CO2: 27 mmol/L (ref 22–32)
Calcium: 8.6 mg/dL — ABNORMAL LOW (ref 8.9–10.3)
Chloride: 98 mmol/L (ref 98–111)
Creatinine, Ser: 0.75 mg/dL (ref 0.44–1.00)
GFR, Estimated: >60 mL/min (ref >60)
Glucose, Bld: 110 mg/dL — ABNORMAL HIGH (ref 70–99)
Potassium: 2.9 mmol/L — ABNORMAL LOW (ref 3.5–5.1)
Sodium: 136 mmol/L (ref 135–145)
Total Bilirubin: 1.5 mg/dL — ABNORMAL HIGH (ref 0.3–1.2)
Total Protein: 6.7 g/dL (ref 6.5–8.1)

## 2023-09-09 LAB — BASIC METABOLIC PANEL
Anion gap: 7 (ref 5–15)
BUN: 6 mg/dL (ref 6–20)
CO2: 28 mmol/L (ref 22–32)
Calcium: 8.4 mg/dL — ABNORMAL LOW (ref 8.9–10.3)
Chloride: 101 mmol/L (ref 98–111)
Creatinine, Ser: 0.64 mg/dL (ref 0.44–1.00)
GFR, Estimated: >60 mL/min (ref >60)
Glucose, Bld: 109 mg/dL — ABNORMAL HIGH (ref 70–99)
Potassium: 3.4 mmol/L — ABNORMAL LOW (ref 3.5–5.1)
Sodium: 136 mmol/L (ref 135–145)

## 2023-09-09 LAB — HCG, SERUM, QUALITATIVE: Preg, Serum: NEGATIVE

## 2023-09-09 LAB — PREPARE RBC (CROSSMATCH)

## 2023-09-09 LAB — LACTIC ACID, PLASMA: Lactic Acid, Venous: 1.4 mmol/L (ref 0.5–1.9)

## 2023-09-09 LAB — HIV ANTIBODY (ROUTINE TESTING W REFLEX): HIV Screen 4th Generation wRfx: NONREACTIVE

## 2023-09-09 MED ORDER — MORPHINE SULFATE (PF) 4 MG/ML IV SOLN
4.0000 mg | Freq: Once | INTRAVENOUS | Status: AC
  Administered 2023-09-09: 4 mg via INTRAVENOUS
  Filled 2023-09-09: qty 1

## 2023-09-09 MED ORDER — CHLORHEXIDINE GLUCONATE CLOTH 2 % EX PADS
6.0000 | MEDICATED_PAD | Freq: Every day | CUTANEOUS | Status: DC
  Administered 2023-09-09: 6 via TOPICAL

## 2023-09-09 MED ORDER — FUROSEMIDE 10 MG/ML IJ SOLN
40.0000 mg | Freq: Once | INTRAMUSCULAR | Status: AC
  Administered 2023-09-09: 40 mg via INTRAVENOUS
  Filled 2023-09-09: qty 4

## 2023-09-09 MED ORDER — IPRATROPIUM-ALBUTEROL 0.5-2.5 (3) MG/3ML IN SOLN
3.0000 mL | Freq: Four times a day (QID) | RESPIRATORY_TRACT | Status: DC
  Administered 2023-09-09 (×2): 3 mL via RESPIRATORY_TRACT
  Filled 2023-09-09 (×2): qty 3

## 2023-09-09 MED ORDER — SODIUM CHLORIDE 0.9% IV SOLUTION: Freq: Once | INTRAVENOUS | Status: AC

## 2023-09-09 MED ORDER — HEPARIN SOD (PORK) LOCK FLUSH 100 UNIT/ML IV SOLN
500.0000 [IU] | INTRAVENOUS | Status: AC | PRN
  Administered 2023-09-09: 500 [IU]
  Filled 2023-09-09: qty 5

## 2023-09-09 MED ORDER — FUROSEMIDE 10 MG/ML IJ SOLN: 40.0000 mg | Freq: Once | INTRAMUSCULAR | Status: DC

## 2023-09-09 MED ORDER — SODIUM CHLORIDE 0.9% FLUSH: 10.0000 mL | INTRAVENOUS | Status: DC | PRN

## 2023-09-09 MED ORDER — SODIUM CHLORIDE 0.9% IV SOLUTION: Freq: Once | INTRAVENOUS | Status: DC

## 2023-09-09 MED ORDER — SODIUM CHLORIDE 0.9% FLUSH
10.0000 mL | Freq: Two times a day (BID) | INTRAVENOUS | Status: DC
  Administered 2023-09-09: 10 mL

## 2023-09-09 NOTE — Progress Notes (Signed)
Patient refuses blood type and screen and decided to leave AMA; Dr Dartha Lodge and myself has given patient risks about leaving in her current condition; patient is requiring 3 to 4 liters of oxygen to keep her pulse oxygen level above 92%; tested patient without oxygen while resting and patient oxygen level dropped to 76% on room air; I informed patient that this was dangerous for her to go home and she stated she was okay with that; patient stated she does not have oxygen at home and does not want it. Patient has signed AMA form; patient telemetry has been removed and IV team is de accessing her left chest port;

## 2023-09-09 NOTE — Progress Notes (Signed)
IV team consult placed for DBIV type & screen. Upon arrival to room, patient refused to have labs drawn. Primary RN notified.

## 2023-09-09 NOTE — Progress Notes (Signed)
Oncology was consulted at 430 to see vascular surveillance for oncologic input. She was sent in on the symptom management clinic for hypotension hypoxia and worsening shortness of breath. I went to see the patient unfortunately she had signed out AGAINST MEDICAL ADVICE. I called her at home and she notes that she is not short of breath and is eating well.  Feels a little lightheaded but no overt chest pain. She did not receive a PRBC transfusion today as well as the plan. Received a few days of antibiotics but was not able to go home on antibiotics. I unequivocally recommended she go back to the emergency room since her pulmonary infiltrate and hypoxia has not been completely evaluated and treated at this time. She notes that she will consider going back if she feels unwell but will go to [facility] emergency room instead of [facility] emergency room since she did not feel comfortable with her inpatient treatment.  Wyvonnia Lora MD MS Hematology/Oncology Physician [facility]

## 2023-09-09 NOTE — ED Provider Notes (Signed)
[place] EMERGENCY DEPARTMENT AT [facility] Provider Note   CSN: [id number] Arrival date & time: 09/09/23  2047     History  Chief Complaint  Patient presents with   Near Syncope    Heidi [name] is a 37 y.o. female.  With history of Hodgkin lymphoma, asthma, hypertension presenting for evaluation of near syncope.  Patient was seen in the emergency department yesterday for hypoxia and tachypnea, was then admitted to hospitalist service.  Per chart review, patient left the hospital earlier today AGAINST MEDICAL ADVICE.  Patient states she was feeling better and would rather be sent home.  She states when she got home, she took a shower and started to feel lightheaded.  She has had intermittent episodes of lightheaded since that time.  She has some chest pain with coughing as well.  This is localized mostly to the left axilla.  She reports no history of smoking.  Smokes half pack per day.  Has been doing so for many years.   Near Syncope Associated symptoms include shortness of breath.       Home Medications Prior to Admission medications   Medication Sig Start Date End Date Taking? Authorizing Provider  TYLENOL 500 MG tablet Take 500 mg by mouth every 6 (six) hours as needed for mild pain or headache.   Yes [provider]  furosemide (LASIX) 20 MG tablet Take 1 tablet (20 mg total) by mouth daily for 5 days. Patient not taking: Reported on 09/10/2023 07/14/23 09/10/23  Shanon Ace, PA-C  gabapentin (NEURONTIN) 300 MG capsule Take 1 capsule (300 mg total) by mouth at bedtime. Patient not taking: Reported on 09/10/2023 05/27/23   [name], MD  HYDROcodone-acetaminophen (NORCO/VICODIN) 5-325 MG tablet Take 1 tablet by mouth every 6 (six) hours as needed. Patient not taking: Reported on 09/10/2023 07/09/23   Barrie Dunker B, PA-C  lidocaine (LIDODERM) 5 % Place 1 patch onto the skin daily. Remove & Discard patch within 12 hours or as  directed by MD Patient not taking: Reported on 09/10/2023 03/29/23   Prosperi, Christian H, PA-C  lidocaine-prilocaine (EMLA) cream Apply to affected area once Patient not taking: Reported on 09/10/2023 06/30/23   [name], MD  ondansetron (ZOFRAN) 8 MG tablet Take 1 tablet (8 mg total) by mouth every 8 (eight) hours as needed for nausea or vomiting. Patient not taking: Reported on 09/10/2023 06/30/23   [name], MD  prochlorperazine (COMPAZINE) 10 MG tablet Take 1 tablet (10 mg total) by mouth every 6 (six) hours as needed for nausea or vomiting. Patient not taking: Reported on 09/10/2023 06/30/23   [name], MD      Allergies    Doxorubicin hcl liposomal    Review of Systems   Review of Systems  Respiratory:  Positive for cough and shortness of breath.   Cardiovascular:  Positive for near-syncope.  Neurological:  Positive for syncope.    Physical Exam Updated Vital Signs BP 96/71   Pulse (!) 128   Temp 98.5 F (36.9 C) (Oral)   Resp (!) 22   Ht 5\' 7"  (1.702 m)   Wt 64.4 kg   SpO2 98%   BMI 22.24 kg/m  Physical Exam Vitals and nursing note reviewed.  Constitutional:      Appearance: She is well-developed. She is ill-appearing.     Comments: Resting in bed  HENT:     Head: Normocephalic and atraumatic.  Eyes:     Conjunctiva/sclera:  Conjunctivae normal.  Cardiovascular:     Rate and Rhythm: Regular rhythm. Tachycardia present.  Pulmonary:     Effort: Pulmonary effort is normal. No respiratory distress.     Breath sounds: Rhonchi present.  Abdominal:     Palpations: Abdomen is soft.     Tenderness: There is no abdominal tenderness.  Musculoskeletal:        General: No swelling.     Cervical back: Neck supple.     Right lower leg: No edema.     Left lower leg: No edema.  Skin:    General: Skin is warm and dry.     Capillary Refill: Capillary refill takes less than 2 seconds.  Neurological:     Mental Status: She is alert and oriented to  person, place, and time.  Psychiatric:        Mood and Affect: Mood normal.     ED Results / Procedures / Treatments   Labs (all labs ordered are listed, but only abnormal results are displayed) Labs Reviewed  CBC WITH DIFFERENTIAL/PLATELET - Abnormal; Notable for the following components:      Result Value   WBC 31.1 (*)    RBC 2.37 (*)    Hemoglobin 7.2 (*)    HCT 22.3 (*)    RDW 18.6 (*)    Platelets 52 (*)    nRBC 0.3 (*)    Neutro Abs 27.7 (*)    Lymphs Abs 0.6 (*)    Monocytes Absolute 1.9 (*)    Eosinophils Absolute 0.6 (*)    Abs Immature Granulocytes 0.30 (*)    All other components within normal limits  COMPREHENSIVE METABOLIC PANEL - Abnormal; Notable for the following components:   Potassium 2.9 (*)    Glucose, Bld 110 (*)    Calcium 8.6 (*)    Alkaline Phosphatase 141 (*)    Total Bilirubin 1.5 (*)    All other components within normal limits  HCG, SERUM, QUALITATIVE  LACTIC ACID, PLASMA  LACTIC ACID, PLASMA  STREP PNEUMONIAE URINARY ANTIGEN  LEGIONELLA PNEUMOPHILA SEROGP 1 UR AG  PROCALCITONIN  MAGNESIUM  CBC  COMPREHENSIVE METABOLIC PANEL  TYPE AND SCREEN  PREPARE RBC (CROSSMATCH)    EKG None  Radiology DG Chest Port 1 View  Result Date: 09/09/2023 CLINICAL DATA:  Shortness of breath and cough EXAM: PORTABLE CHEST 1 VIEW COMPARISON:  09/08/2019 FINDINGS: Cardiac shadow remains enlarged. Left chest wall port is again seen and stable. Lungs again demonstrates some volume loss on the right stable from the prior exam. Central vascular congestion is seen stable from the prior exam. No new focal infiltrate is noted. No bony abnormality is seen. IMPRESSION: Central vascular congestion similar to that seen on prior plain film. Stable volume loss on the right is noted similar to that seen on the prior exam. Electronically Signed   By: Alcide Clever M.D.   On: 09/09/2023 23:35   [place] CHEST (PLEURAL EFFUSION)  Result Date: 09/08/2023 CLINICAL DATA:  37 year old  female history of pleural effusion. EXAM: CHEST ULTRASOUND COMPARISON:  Chest CT from 09/08/2023 FINDINGS: Trace left pleural effusion. No evidence of significant right pleural effusion. IMPRESSION: No sufficient pleural fluid for thoracentesis. No thoracentesis was performed. Marliss Coots, MD [facility] Radiology Electronically Signed   By: Marliss Coots M.D.   On: 09/08/2023 16:28   ECHOCARDIOGRAM COMPLETE  Result Date: 09/08/2023    ECHOCARDIOGRAM REPORT   Patient Name:   Khaylee Charna Elizabeth Date of Exam:  09/08/2023 Medical Rec #:  [id number]          Height:       67.0 in Accession #:    [id number]         Weight:       143.8 lb Date of Birth:  Nov 09, 1986         BSA:          1.758 m Patient Age:    36 years           BP:           139/90 mmHg Patient Gender: F                  HR:           118 bpm. Exam Location:  Inpatient Procedure: 2D Echo, Cardiac Doppler and Color Doppler Indications:    Pericardial Effusion I31.3  History:        Patient has prior history of Echocardiogram examinations, most                 recent 07/04/2023.  Sonographer:    Harriette Bouillon RDCS Referring Phys: [id number] Elenor Quinones DAVIS IMPRESSIONS  1. Left ventricular ejection fraction, by estimation, is 65 to 70%. The left ventricle has normal function. The left ventricle has no regional wall motion abnormalities. Left ventricular diastolic parameters are indeterminate. There is the interventricular septum is flattened in systole, consistent with right ventricular pressure overload.  2. Right ventricular systolic function is moderately reduced. The right ventricular size is normal. Tricuspid regurgitation signal is inadequate for assessing PA pressure.  3. Small to moderate circumfrential pericardial effusion. The pericardial effusion is circumferential.  4. The mitral valve is normal in structure. Trivial mitral valve regurgitation. No evidence of mitral stenosis.  5. The aortic valve  is tricuspid. Aortic valve regurgitation is not visualized. No aortic stenosis is present.  6. The inferior vena cava is dilated in size with <50% respiratory variability, suggesting right atrial pressure of 15 mmHg. Conclusion(s)/Recommendation(s): Technically limited echo due to poor sound wave transmission. There is a small to moderate pericardial efussion with a dilated IVC. Doubt tamponade physiology but mitral inflow with respirometer not performed. Given pericardial effusion, RV dysfunction and IVC dialtion would consider evalaution for pulmonary HTN. FINDINGS  Left Ventricle: Left ventricular ejection fraction, by estimation, is 65 to 70%. The left ventricle has normal function. The left ventricle has no regional wall motion abnormalities. The left ventricular internal cavity size was normal in size. There is  no left ventricular hypertrophy. The interventricular septum is flattened in systole, consistent with right ventricular pressure overload. Left ventricular diastolic parameters are indeterminate. Right Ventricle: The right ventricular size is normal. Right vetricular wall thickness was not well visualized. Right ventricular systolic function is moderately reduced. Tricuspid regurgitation signal is inadequate for assessing PA pressure. Left Atrium: Left atrial size was normal in size. Right Atrium: Right atrial size was normal in size. Pericardium: A moderately sized pericardial effusion is present. The pericardial effusion is circumferential. Mitral Valve: The mitral valve is normal in structure. Trivial mitral valve regurgitation. No evidence of mitral valve stenosis. Tricuspid Valve: The tricuspid valve is not well visualized. Tricuspid valve regurgitation is trivial. No evidence of tricuspid stenosis. Aortic Valve: The aortic valve is tricuspid. Aortic valve regurgitation is not visualized. No aortic stenosis is present. Pulmonic Valve: The pulmonic valve was normal in structure. Pulmonic valve  regurgitation is not visualized. No evidence of pulmonic stenosis. Aorta: The  aortic root is normal in size and structure. Venous: The inferior vena cava is dilated in size with less than 50% respiratory variability, suggesting right atrial pressure of 15 mmHg. IAS/Shunts: No atrial level shunt detected by color flow Doppler.  LEFT VENTRICLE PLAX 2D LVIDd:         4.40 cm   Diastology LVIDs:         3.60 cm   LV e' lateral: 8.92 cm/s LV PW:         0.90 cm LV IVS:        0.80 cm LVOT diam:     2.20 cm LV SV:         51 LV SV Index:   29 LVOT Area:     3.80 cm  RIGHT VENTRICLE             IVC RV S prime:     11.40 cm/s  IVC diam: 2.30 cm TAPSE (M-mode): 0.9 cm LEFT ATRIUM           Index LA Vol (A4C): 34.2 ml 19.46 ml/m  AORTIC VALVE LVOT Vmax:   101.00 cm/s LVOT Vmean:  67.600 cm/s LVOT VTI:    0.135 m  AORTA Ao Root diam: 3.10 cm Ao Asc diam:  3.20 cm  SHUNTS Systemic VTI:  0.14 m Systemic Diam: 2.20 cm Arvilla Meres MD Electronically signed by Arvilla Meres MD Signature Date/Time: 09/08/2023/1:46:51 PM    Final    CT Angio Chest Pulmonary Embolism (PE) W or WO Contrast  Result Date: 09/08/2023 CLINICAL DATA:  Shortness of breath, hypoxia, tachycardia. Hodgkin lymphoma EXAM: CT ANGIOGRAPHY CHEST WITH CONTRAST TECHNIQUE: Multidetector CT imaging of the chest was performed using the standard protocol during bolus administration of intravenous contrast. Multiplanar CT image reconstructions and MIPs were obtained to evaluate the vascular anatomy. RADIATION DOSE REDUCTION: This exam was performed according to the departmental dose-optimization program which includes automated exposure control, adjustment of the mA and/or kV according to patient size and/or use of iterative reconstruction technique. CONTRAST:  75mL OMNIPAQUE IOHEXOL 350 MG/ML SOLN COMPARISON:  PET-CT 06/15/2023 FINDINGS: Despite efforts by the technologist and patient, motion artifact is present on today's exam and could not be eliminated.  This reduces exam sensitivity and specificity. Cardiovascular: Prominent main pulmonary artery at 3.6 cm diameter on image 153 of series 11 suggesting pulmonary arterial hypertension. No filling defect is identified in the pulmonary arterial tree to suggest pulmonary embolus. Increased pericardial effusion, previously mild and currently moderate to prominent. Moderate cardiomegaly. Mediastinum/Nodes: Low-grade edema/stranding in the mediastinum. No observed pathologic adenopathy. Lungs/Pleura: Right anterior pleural calcification on image 57 series 5, unchanged from prior exams. Moderate left and small right pleural effusion. Emphysema. Patchy chronic airspace opacities especially in the right lower lobe and left upper lobe, mildly increased interstitial accentuation and patchy ground-glass opacities in a mosaic attenuation pattern potentially from pulmonary edema, atypical pneumonia, or hypersensitivity pneumonitis. Some of the chronic airspace opacities could be therapy related. Upper Abdomen: Unremarkable Musculoskeletal: Unremarkable Review of the MIP images confirms the above findings. IMPRESSION: 1. No filling defect is identified in the pulmonary arterial tree to suggest pulmonary embolus. 2. Increased pericardial effusion, previously mild and currently moderate to prominent. Correlate with clinical indicators such as Beck's triad in assessing for tamponade, echocardiography may be warranted. 3. Moderate left and small right pleural effusion. 4. Patchy chronic airspace opacities especially in the right lower lobe and left upper lobe, mildly increased interstitial accentuation and patchy ground-glass opacities in a  mosaic attenuation pattern potentially from pulmonary edema, atypical pneumonia, or hypersensitivity pneumonitis. Some of the chronic airspace opacities could be therapy related. 5. Prominent main pulmonary artery at 3.6 cm diameter suggesting pulmonary arterial hypertension. Critical Value/emergent  results were called by telephone at the time of interpretation on 09/08/2023 at 12:32 pm to provider [facility] DAVIS , who verbally acknowledged these results. Electronically Signed   By: Gaylyn Rong M.D.   On: 09/08/2023 12:35   DG Chest Port 1 View  Result Date: 09/08/2023 CLINICAL DATA:  Shortness of breath EXAM: PORTABLE CHEST 1 VIEW COMPARISON:  Chest radiograph 08/22/2023, same-day CTA chest FINDINGS: The left chest wall port is stable in position terminating in the region of the cavoatrial junction. The heart is enlarged, unchanged. There is unchanged volume loss in the right hemithorax. There is vascular congestion and mild pulmonary interstitial edema. Additional airspace opacities in both lungs are also noted common better seen on the same-day CT chest. There is no pneumothorax There is no acute osseous abnormality. IMPRESSION: 1. Cardiomegaly with small bilateral pleural effusions and pulmonary interstitial edema. 2. Unchanged volume loss in the right hemithorax. Additional patchy airspace opacities in both lungs better seen on the same-day CT chest. Electronically Signed   By: Lesia Hausen M.D.   On: 09/08/2023 11:44    Procedures Procedures    Medications Ordered in ED Medications  0.9 %  sodium chloride infusion (Manually program via Guardrails IV Fluids) (has no administration in time range)  cefTRIAXone (ROCEPHIN) 2 g in sodium chloride 0.9 % 100 mL IVPB (has no administration in time range)  azithromycin (ZITHROMAX) 500 mg in sodium chloride 0.9 % 250 mL IVPB (has no administration in time range)  sodium chloride flush (NS) 0.9 % injection 3 mL (has no administration in time range)  acetaminophen (TYLENOL) tablet 650 mg (has no administration in time range)    Or  acetaminophen (TYLENOL) suppository 650 mg (has no administration in time range)  ondansetron (ZOFRAN) tablet 4 mg (has no administration in time range)    Or  ondansetron (ZOFRAN) injection 4 mg (has no  administration in time range)  senna-docusate (Senokot-S) tablet 1 tablet (has no administration in time range)  guaiFENesin (MUCINEX) 12 hr tablet 600 mg (has no administration in time range)  potassium chloride (KLOR-CON) packet 60 mEq (has no administration in time range)  morphine (PF) 4 MG/ML injection 4 mg (4 mg Intravenous Given 09/09/23 2315)    ED Course/ Medical Decision Making/ A&P Clinical Course as of 09/10/23 0102  Sat Sep 10, 2023  0004 Spoke with hospitalist Dr. Allena Katz who will admit [AS]    Clinical Course User Index [AS] Cutler Sunday, Edsel Petrin, PA-C                                 Medical Decision Making Amount and/or Complexity of Data Reviewed Labs: ordered. Radiology: ordered.  Risk Prescription drug management. Decision regarding hospitalization.   This patient presents to the ED for concern of near syncope, this involves an extensive number of treatment options, and is a complaint that carries with it a high risk of complications and morbidity.  The differential for syncope is extensive and includes, but is not limited to: arrythmia (Vtach, SVT, SSS, sinus arrest, AV block, bradycardia) aortic stenosis, AMI, HOCM, PE, atrial myxoma, pulmonary hypertension, orthostatic hypotension, (hypovolemia, drug effect, GB syndrome, micturition, cough, swall) carotid sinus sensitivity, Seizure, TIA/CVA, hypoglycemia,  Vertigo.   Co morbidities that complicate the patient evaluation  Hodgkin lymphoma, asthma, hypertension  My initial workup includes labs, imaging, EKG  Additional history obtained from: Nursing notes from this visit. Previous records within EMR system ED visit yesterday with subsequent admission  I ordered, reviewed and interpreted labs which include: CBC, CMP, hCG, type and screen, lactate  I ordered imaging studies including chest x-ray I independently visualized and interpreted imaging which showed no recent change. I agree with the radiologist  interpretation  Cardiac Monitoring:  The patient was maintained on a cardiac monitor.  I personally viewed and interpreted the cardiac monitored which showed an underlying rhythm of: Sinus tachycardia  Consultations Obtained:  I requested consultation with the hospitalist Dr. Allena Katz,  and discussed lab and imaging findings as well as pertinent plan - they recommend: Admission.  Afebrile, tachycardic tachypneic and hypoxic on room air.  37 year old female presenting for evaluation of near syncope.  She presented to the emergency department yesterday for hypoxia and tachycardia from the cancer center and was admitted to hospitalist service.  Patient refused patient type and screen, blood transfusion and left AGAINST MEDICAL ADVICE earlier today.  This was after lengthy discussion of risks from the oncologist.  She had increasing symptoms of near syncope after returning home and decided to come back to the emergency department.  Mostly complaining of fatigue and shortness of breath at this time.  Patient is significantly hypoxic on room air.  I discussed with patient the need for repeat admission and continued care.  Patient is in agreement with staying.  Will initiate blood transfusion for hemoglobin of 7.2 in the setting of tachycardia and hypoxia.  Patient is requiring 3 L nasal cannula. will admit to hospitalist acute hypoxic respiratory failure.  Stable at time of admission.  Note: Portions of this report may have been transcribed using voice recognition software. Every effort was made to ensure accuracy; however, inadvertent computerized transcription errors may still be present.        Final Clinical Impression(s) / ED Diagnoses Final diagnoses:  Acute hypoxic respiratory failure [facility])    Rx / DC Orders ED Discharge Orders     None         Mora Bellman 09/10/23 0102    Nira Conn, MD 09/10/23 [phone]

## 2023-09-09 NOTE — TOC Initial Note (Addendum)
Transition of Care [facility]) - Initial/Assessment Note    Patient Details  Name: Heidi [name] MRN: [id number] Date of Birth: [birth year]  Transition of Care [facility]) CM/SW Contact:    Howell Rucks, RN Phone Number: 09/09/2023, 11:27 AM  Clinical Narrative:  Met with pt at bedside to introduce role of TOC/NCM and review for dc planning. Pt drowsy during assessment, will complete initial assessment when pt more alert.    -12:38pm Met with pt at bedside to introduce role of TOC/NCM and review for dc planning. Pt reports she has a pharmacy in place, but does not have a PCP, agreeable to NCM scheduling a f/u appt at one of [facility]. Pt reports no current home care services or home DME, reports she feels safe returning home with support from her family, confirms transportation available at discharge. TOC will continue to follow.  -1:30pm Hospital follow up appointment scheduled at : [facility], [address] [place], [place], [place], [place] [address], Wednesday, September 14, 2023 at 9:20am, added to AVS.                     Patient Goals and CMS Choice            Expected Discharge Plan and Services                                              Prior Living Arrangements/Services                       Activities of Daily Living Home Assistive Devices/Equipment: None ADL Screening (condition at time of admission) Patient's cognitive ability adequate to safely complete daily activities?: Yes Is the patient deaf or have difficulty hearing?: No Does the patient have difficulty seeing, even when wearing glasses/contacts?: No Does the patient have difficulty concentrating, remembering, or making decisions?: No Patient able to express need for assistance with ADLs?: Yes Does the patient have difficulty dressing or bathing?: No Independently performs ADLs?: Yes (appropriate for developmental age) Does the patient have difficulty walking or  climbing stairs?: No Weakness of Legs: None Weakness of Arms/Hands: None  Permission Sought/Granted                  Emotional Assessment              Admission diagnosis:  Hypoxia [R09.02] Acute hypoxic respiratory failure (HCC) [J96.01] Patient Active Problem List   Diagnosis Date Noted   Acute hypoxic respiratory failure (HCC) 09/08/2023   Post herpetic neuralgia    Hodgkin's lymphoma (HCC) 11/14/2018   Herpes zoster without complication    Counseling regarding advance care planning and goals of care 10/09/2018   Hodgkin's disease in adult [facility]) 10/09/2018   Port-A-Cath in place 09/28/2018   Pruritus 01/20/2017   Central line complication    Deep vein thrombosis (DVT) of upper extremity (HCC)    SVC syndrome    Dyspnea 12/14/2016   Swelling 12/14/2016   Anemia of chronic disease 11/26/2016   CAP (community acquired pneumonia) 11/23/2016   PNA (pneumonia) 11/23/2016   Hyperpigmentation 06/04/2016   Hypersensitivity reaction 05/04/2016   Encounter for antineoplastic chemotherapy    Hypokalemia 04/20/2016   Volume overload 04/20/2016   S/P bronchoscopy with biopsy    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)  HCAP (healthcare-associated pneumonia) 04/10/2016   Hyponatremia 04/10/2016   Thrombocytosis 04/10/2016   Pneumonitis 03/16/2016   Postobstructive pneumonia 03/16/2016   Acute respiratory failure with hypoxia (HCC) 03/16/2016   Leukocytosis 03/16/2016   Sepsis due to pneumonia (HCC) 03/16/2016   Lung mass    Lymphadenopathy    PCP:  Pcp, No Pharmacy:   [id number] - [place], [place] - [address] AT [facility] [address] [facility] [address] [place] [address] Phone: [phone] Fax: [phone]  [facility] [place] [facility] [address] [place] [address] Phone: [phone] Fax: [phone]     Social Determinants of Health (SDOH) Social  History: SDOH Screenings   Food Insecurity: No Food Insecurity (09/08/2023)  Housing: Low Risk  (09/08/2023)  Transportation Needs: No Transportation Needs (09/08/2023)  Utilities: Not At Risk (09/08/2023)  Tobacco Use: High Risk (09/08/2023)   SDOH Interventions:     Readmission Risk Interventions     No data to display

## 2023-09-09 NOTE — ED Triage Notes (Signed)
Pt left AMA earlier from upstairs, states she was about to get a blood transfusion, but decided to leave. Home for 4 hours and got dizzy and felt like she was going to pass out. Pt is tachycardic and dizzy on arrival. Hx of Non-Hodgkin's lymphoma.

## 2023-09-09 NOTE — Discharge Summary (Signed)
Physician Discharge Summary  Patient ID: Heidi [name] MRN: [id number] DOB/AGE: [birth year] 37 y.o.  Admit date: 09/08/2023 Patient signed AGAINST MEDICAL ADVICE and left the hospital on: 09/09/2023  Admission Diagnoses:  Discharge Diagnoses:  Principal Problem:   Acute hypoxic respiratory failure [facility])   Hospital Course: Patient is a 37 year old female with past medical history significant for mixed cellularity Hodgkin's lymphoma.  Patient has been under the care of the local oncologist, Dr. Candise Che.  Patient was admitted with hemoglobin of 7 g/dL, acute hypoxic respiratory failure, moderate left-sided pleural effusion of unclear etiology and pericardial effusion without syncope.  Patient is known to be noncompliant.  Patient had signed herself out of the hospital AGAINST MEDICAL ADVICE in the past.  Patient was admitted for further evaluation and management.  Oncology team (Dr. Candise Che) and cardiology team were consulted to assist with patient's management.  Unfortunately, patient signed herself out of the hospital AGAINST MEDICAL ADVICE.    Allergies as of 09/09/2023       Reactions   Doxorubicin Hcl Liposomal Anaphylaxis, Shortness Of Breath   Pt had severe hypersensitivity reaction with chest pain and SOB immediately following start of medication. See hypersensitivity note from 07/14/2023.         Follow-up Information     [facility] Follow up.   Specialty: Internal Medicine Why: Hospital follow up appointment: Wednesday, September 14, 2023 at 9:20am Contact information: [address] [name] [address] [phone]                Signed: Barnetta Chapel 09/09/2023, 6:47 PM

## 2023-09-09 NOTE — Plan of Care (Signed)

## 2023-09-09 NOTE — Progress Notes (Signed)
Patient Name: Heidi [name]           DOB: Dec 10, 1986  MRN: [id number]      Admission Date: 09/08/2023  Attending Provider: Maryln Gottron, MD  Primary Diagnosis: Acute hypoxic respiratory failure [facility])   Level of care: Progressive    CROSS COVER NOTE   Date of Service   09/09/2023   Heidi [name], 37 y.o. female, was admitted on 09/08/2023 for Acute hypoxic respiratory failure (HCC).    HPI/Events of Note   Dyspnea at rest Tachycardia- 120-130's Prior chest images --> bilateral pleural effusion, pericardial effusion without evidence of obvious tamponade physiology.   Denies CP, palpitations, abd discomfort, N/V.  Reports some dry cough, and muscle aching.  Currently on 2-3L [place] satting 86%. RN was asked titrate 02 for SpO2> 92. Added Xopenex neb for SOB, 1 mg morphine for inc WOB, and IV lasix.    Addendum  0130-  Improvement made after interventions. Now on 4L  [place], HR 110s, RR 20's  Interventions/ Plan   Above        Anthoney Harada, DNP, [facility] [place]

## 2023-09-09 NOTE — Progress Notes (Signed)
Called respiratory for patient to have breathing tx; patient working to breathe and has mild wheezing LLQ

## 2023-09-10 DIAGNOSIS — J9601 Acute respiratory failure with hypoxia: Secondary | ICD-10-CM | POA: Diagnosis not present

## 2023-09-10 DIAGNOSIS — D696 Thrombocytopenia, unspecified: Secondary | ICD-10-CM | POA: Diagnosis present

## 2023-09-10 DIAGNOSIS — I3139 Other pericardial effusion (noninflammatory): Secondary | ICD-10-CM | POA: Diagnosis present

## 2023-09-10 DIAGNOSIS — J90 Pleural effusion, not elsewhere classified: Secondary | ICD-10-CM | POA: Diagnosis present

## 2023-09-10 LAB — COMPREHENSIVE METABOLIC PANEL
ALT: 15 U/L (ref 0–44)
AST: 15 U/L (ref 15–41)
Albumin: 3.6 g/dL (ref 3.5–5.0)
Alkaline Phosphatase: 151 U/L — ABNORMAL HIGH (ref 38–126)
Anion gap: 12 (ref 5–15)
BUN: 6 mg/dL (ref 6–20)
CO2: 27 mmol/L (ref 22–32)
Calcium: 8.2 mg/dL — ABNORMAL LOW (ref 8.9–10.3)
Chloride: 95 mmol/L — ABNORMAL LOW (ref 98–111)
Creatinine, Ser: 0.8 mg/dL (ref 0.44–1.00)
GFR, Estimated: >60 mL/min (ref >60)
Glucose, Bld: 115 mg/dL — ABNORMAL HIGH (ref 70–99)
Potassium: 2.9 mmol/L — ABNORMAL LOW (ref 3.5–5.1)
Sodium: 134 mmol/L — ABNORMAL LOW (ref 135–145)
Total Bilirubin: 1.1 mg/dL (ref 0.3–1.2)
Total Protein: 6.6 g/dL (ref 6.5–8.1)

## 2023-09-10 LAB — CBC
HCT: 24.5 % — ABNORMAL LOW (ref 36.0–46.0)
Hemoglobin: 8 g/dL — ABNORMAL LOW (ref 12.0–15.0)
MCH: 30.4 pg (ref 26.0–34.0)
MCHC: 32.7 g/dL (ref 30.0–36.0)
MCV: 93.2 fL (ref 80.0–100.0)
Platelets: 51 10*3/uL — ABNORMAL LOW (ref 150–400)
RBC: 2.63 MIL/uL — ABNORMAL LOW (ref 3.87–5.11)
RDW: 18 % — ABNORMAL HIGH (ref 11.5–15.5)
WBC: 26.7 10*3/uL — ABNORMAL HIGH (ref 4.0–10.5)
nRBC: 0.2 % (ref 0.0–0.2)

## 2023-09-10 LAB — MAGNESIUM: Magnesium: 1.7 mg/dL (ref 1.7–2.4)

## 2023-09-10 LAB — PROCALCITONIN: Procalcitonin: 0.54 ng/mL

## 2023-09-10 LAB — BRAIN NATRIURETIC PEPTIDE: B Natriuretic Peptide: 28.4 pg/mL (ref 0.0–100.0)

## 2023-09-10 MED ORDER — ONDANSETRON HCL 4 MG/2ML IJ SOLN: 4.0000 mg | Freq: Four times a day (QID) | INTRAMUSCULAR | Status: DC | PRN

## 2023-09-10 MED ORDER — SENNOSIDES-DOCUSATE SODIUM 8.6-50 MG PO TABS: 1.0000 | ORAL_TABLET | Freq: Every evening | ORAL | Status: DC | PRN

## 2023-09-10 MED ORDER — SODIUM CHLORIDE 0.9 % IV SOLN
2.0000 g | Freq: Every day | INTRAVENOUS | Status: DC
  Administered 2023-09-10: 2 g via INTRAVENOUS
  Filled 2023-09-10: qty 20

## 2023-09-10 MED ORDER — HEPARIN SOD (PORK) LOCK FLUSH 100 UNIT/ML IV SOLN: 500.0000 [IU] | Freq: Once | INTRAVENOUS | Status: AC

## 2023-09-10 MED ORDER — SODIUM CHLORIDE 0.9 % IV SOLN
500.0000 mg | Freq: Every day | INTRAVENOUS | Status: DC
  Administered 2023-09-10: 500 mg via INTRAVENOUS
  Filled 2023-09-10: qty 5

## 2023-09-10 MED ORDER — HEPARIN SOD (PORK) LOCK FLUSH 100 UNIT/ML IV SOLN
INTRAVENOUS | Status: AC
  Administered 2023-09-10: 500 [IU]
  Filled 2023-09-10: qty 5

## 2023-09-10 MED ORDER — POTASSIUM CHLORIDE 20 MEQ PO PACK
60.0000 meq | PACK | Freq: Once | ORAL | Status: AC
  Administered 2023-09-10: 60 meq via ORAL
  Filled 2023-09-10: qty 3

## 2023-09-10 MED ORDER — ACETAMINOPHEN 650 MG RE SUPP: 650.0000 mg | Freq: Four times a day (QID) | RECTAL | Status: DC | PRN

## 2023-09-10 MED ORDER — ACETAMINOPHEN 325 MG PO TABS: 650.0000 mg | ORAL_TABLET | Freq: Four times a day (QID) | ORAL | Status: DC | PRN

## 2023-09-10 MED ORDER — SODIUM CHLORIDE 0.9% FLUSH
3.0000 mL | Freq: Two times a day (BID) | INTRAVENOUS | Status: DC
  Administered 2023-09-10: 3 mL via INTRAVENOUS

## 2023-09-10 MED ORDER — GUAIFENESIN ER 600 MG PO TB12
600.0000 mg | ORAL_TABLET | Freq: Two times a day (BID) | ORAL | Status: DC
  Administered 2023-09-10: 600 mg via ORAL
  Filled 2023-09-10: qty 1

## 2023-09-10 MED ORDER — ONDANSETRON HCL 4 MG PO TABS: 4.0000 mg | ORAL_TABLET | Freq: Four times a day (QID) | ORAL | Status: DC | PRN

## 2023-09-10 NOTE — Discharge Summary (Signed)
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 *  Hospital Course: Heidi [name] is a 37 y.o. female with medical history significant for refractory mixed cellularity Hodgkin's lymphoma (with extensive lymphadenopathy including the right axillary, mediastinal, upper retroperitoneal, and biopsy-proven pulmonary involvement), bilateral pleural effusion, pericardial effusion, anemia and thrombocytopenia, tobacco use, and medical nonadherence who is admitted with acute hypoxic respiratory failure and symptomatic anemia.  Heidi left AMA(again) after receiving 1 unit PRBC. Pt refused to stay in the hospital. According to RN documentation, pt was AxOx4. Pt signed AMA paperwork acknowledging risk of, including not not limited to death/disability.      Consultants: none Procedures performed: none  Disposition: Home DISCHARGE MEDICATION: Allergies as of 09/10/2023       Reactions   Doxorubicin Hcl Liposomal Anaphylaxis, Shortness Of Breath, Other (See Comments)   Pt had severe hypersensitivity reaction with chest pain and SOB immediately following start of medication. See hypersensitivity note from 07/14/2023.       Filed Weights   09/09/23 2106  Weight: 64.4 kg    Condition at discharge:  Pt left AMA  The results of significant diagnostics from this hospitalization (including imaging, microbiology, ancillary and  laboratory) are listed below for reference.   Imaging Studies: DG Chest Port 1 View  Result Date: 09/09/2023 CLINICAL DATA:  Shortness of breath and cough EXAM: PORTABLE CHEST 1 VIEW COMPARISON:  09/08/2019 FINDINGS: Cardiac shadow remains enlarged. Left chest wall port is again seen and stable. Lungs again demonstrates some volume loss on the right stable from the prior exam. Central vascular congestion is seen stable from the prior exam. No new focal infiltrate is noted. No bony abnormality is seen. IMPRESSION: Central vascular congestion similar to that seen on prior plain film. Stable volume loss on the right is noted similar to that seen on the prior exam. Electronically Signed   By: Alcide Clever M.D.   On: 09/09/2023 23:35   [place] CHEST (PLEURAL EFFUSION)  Result Date: 09/08/2023 CLINICAL DATA:  37 year old female history of pleural effusion. EXAM: CHEST ULTRASOUND COMPARISON:  Chest CT from 09/08/2023 FINDINGS: Trace left pleural effusion. No evidence of significant right pleural effusion. IMPRESSION: No sufficient pleural fluid for thoracentesis. No thoracentesis was performed. Marliss Coots, MD [facility] Electronically Signed   By: Marliss Coots M.D.   On: 09/08/2023 16:28   ECHOCARDIOGRAM COMPLETE  Result Date: 09/08/2023    ECHOCARDIOGRAM REPORT   Heidi [name] Date of Exam: 09/08/2023 Medical Rec #:  [id number]          Height:       67.0 in Accession #:    [id number]         Weight:       143.8 lb Date of Birth:  22-Jan-1986         BSA:          1.758 m  Heidi Age:    36 years           BP:           139/90 mmHg Heidi Gender: F                  HR:           118 bpm. Exam Location:  Inpatient Procedure: 2D Echo, Cardiac Doppler and Color Doppler Indications:    Pericardial Effusion I31.3  History:        Heidi has prior history of Echocardiogram examinations, most                 recent 07/04/2023.  Sonographer:     Harriette Bouillon RDCS Referring Phys: [id number] Elenor Quinones DAVIS IMPRESSIONS  1. Left ventricular ejection fraction, by estimation, is 65 to 70%. The left ventricle has normal function. The left ventricle has no regional wall motion abnormalities. Left ventricular diastolic parameters are indeterminate. There is the interventricular septum is flattened in systole, consistent with right ventricular pressure overload.  2. Right ventricular systolic function is moderately reduced. The right ventricular size is normal. Tricuspid regurgitation signal is inadequate for assessing PA pressure.  3. Small to moderate circumfrential pericardial effusion. The pericardial effusion is circumferential.  4. The mitral valve is normal in structure. Trivial mitral valve regurgitation. No evidence of mitral stenosis.  5. The aortic valve is tricuspid. Aortic valve regurgitation is not visualized. No aortic stenosis is present.  6. The inferior vena cava is dilated in size with <50% respiratory variability, suggesting right atrial pressure of 15 mmHg. Conclusion(s)/Recommendation(s): Technically limited echo due to poor sound wave transmission. There is a small to moderate pericardial efussion with a dilated IVC. Doubt tamponade physiology but mitral inflow with respirometer not performed. Given pericardial effusion, RV dysfunction and IVC dialtion would consider evalaution for pulmonary HTN. FINDINGS  Left Ventricle: Left ventricular ejection fraction, by estimation, is 65 to 70%. The left ventricle has normal function. The left ventricle has no regional wall motion abnormalities. The left ventricular internal cavity size was normal in size. There is  no left ventricular hypertrophy. The interventricular septum is flattened in systole, consistent with right ventricular pressure overload. Left ventricular diastolic parameters are indeterminate. Right Ventricle: The right ventricular size is normal. Right vetricular wall thickness was not  well visualized. Right ventricular systolic function is moderately reduced. Tricuspid regurgitation signal is inadequate for assessing PA pressure. Left Atrium: Left atrial size was normal in size. Right Atrium: Right atrial size was normal in size. Pericardium: A moderately sized pericardial effusion is present. The pericardial effusion is circumferential. Mitral Valve: The mitral valve is normal in structure. Trivial mitral valve regurgitation. No evidence of mitral valve stenosis. Tricuspid Valve: The tricuspid valve is not well visualized. Tricuspid valve regurgitation is trivial. No evidence of tricuspid stenosis. Aortic Valve: The aortic valve is tricuspid. Aortic valve regurgitation is not visualized. No aortic stenosis is present. Pulmonic Valve: The pulmonic valve was normal in structure. Pulmonic valve regurgitation is not visualized. No evidence of pulmonic stenosis. Aorta: The aortic root is normal in size and structure. Venous: The inferior vena cava is dilated in size with less than 50% respiratory variability, suggesting right atrial pressure of 15 mmHg. IAS/Shunts: No atrial level shunt detected by color flow Doppler.  LEFT VENTRICLE PLAX 2D LVIDd:         4.40 cm   Diastology LVIDs:         3.60 cm  LV e' lateral: 8.92 cm/s LV PW:         0.90 cm LV IVS:        0.80 cm LVOT diam:     2.20 cm LV SV:         51 LV SV Index:   29 LVOT Area:     3.80 cm  RIGHT VENTRICLE             IVC RV S prime:     11.40 cm/s  IVC diam: 2.30 cm TAPSE (M-mode): 0.9 cm LEFT ATRIUM           Index LA Vol (A4C): 34.2 ml 19.46 ml/m  AORTIC VALVE LVOT Vmax:   101.00 cm/s LVOT Vmean:  67.600 cm/s LVOT VTI:    0.135 m  AORTA Ao Root diam: 3.10 cm Ao Asc diam:  3.20 cm  SHUNTS Systemic VTI:  0.14 m Systemic Diam: 2.20 cm Arvilla Meres MD Electronically signed by Arvilla Meres MD Signature Date/Time: 09/08/2023/1:46:51 PM    Final    CT Angio Chest Pulmonary Embolism (PE) W or WO Contrast  Result Date:  09/08/2023 CLINICAL DATA:  Shortness of breath, hypoxia, tachycardia. Hodgkin lymphoma EXAM: CT ANGIOGRAPHY CHEST WITH CONTRAST TECHNIQUE: Multidetector CT imaging of the chest was performed using the standard protocol during bolus administration of intravenous contrast. Multiplanar CT image reconstructions and MIPs were obtained to evaluate the vascular anatomy. RADIATION DOSE REDUCTION: This exam was performed according to the departmental dose-optimization program which includes automated exposure control, adjustment of the mA and/or kV according to Heidi size and/or use of iterative reconstruction technique. CONTRAST:  75mL OMNIPAQUE IOHEXOL 350 MG/ML SOLN COMPARISON:  PET-CT 06/15/2023 FINDINGS: Despite efforts by the technologist and Heidi, motion artifact is present on today's exam and could not be eliminated. This reduces exam sensitivity and specificity. Cardiovascular: Prominent main pulmonary artery at 3.6 cm diameter on image 153 of series 11 suggesting pulmonary arterial hypertension. No filling defect is identified in the pulmonary arterial tree to suggest pulmonary embolus. Increased pericardial effusion, previously mild and currently moderate to prominent. Moderate cardiomegaly. Mediastinum/Nodes: Low-grade edema/stranding in the mediastinum. No observed pathologic adenopathy. Lungs/Pleura: Right anterior pleural calcification on image 57 series 5, unchanged from prior exams. Moderate left and small right pleural effusion. Emphysema. Patchy chronic airspace opacities especially in the right lower lobe and left upper lobe, mildly increased interstitial accentuation and patchy ground-glass opacities in a mosaic attenuation pattern potentially from pulmonary edema, atypical pneumonia, or hypersensitivity pneumonitis. Some of the chronic airspace opacities could be therapy related. Upper Abdomen: Unremarkable Musculoskeletal: Unremarkable Review of the MIP images confirms the above findings.  IMPRESSION: 1. No filling defect is identified in the pulmonary arterial tree to suggest pulmonary embolus. 2. Increased pericardial effusion, previously mild and currently moderate to prominent. Correlate with clinical indicators such as Beck's triad in assessing for tamponade, echocardiography may be warranted. 3. Moderate left and small right pleural effusion. 4. Patchy chronic airspace opacities especially in the right lower lobe and left upper lobe, mildly increased interstitial accentuation and patchy ground-glass opacities in a mosaic attenuation pattern potentially from pulmonary edema, atypical pneumonia, or hypersensitivity pneumonitis. Some of the chronic airspace opacities could be therapy related. 5. Prominent main pulmonary artery at 3.6 cm diameter suggesting pulmonary arterial hypertension. Critical Value/emergent results were called by telephone at the time of interpretation on 09/08/2023 at 12:32 pm to provider [facility] DAVIS , who verbally acknowledged these results. Electronically Signed   By: Annitta Needs.D.  On: 09/08/2023 12:35   DG Chest Port 1 View  Result Date: 09/08/2023 CLINICAL DATA:  Shortness of breath EXAM: PORTABLE CHEST 1 VIEW COMPARISON:  Chest radiograph 08/22/2023, same-day CTA chest FINDINGS: The left chest wall port is stable in position terminating in the region of the cavoatrial junction. The heart is enlarged, unchanged. There is unchanged volume loss in the right hemithorax. There is vascular congestion and mild pulmonary interstitial edema. Additional airspace opacities in both lungs are also noted common better seen on the same-day CT chest. There is no pneumothorax There is no acute osseous abnormality. IMPRESSION: 1. Cardiomegaly with small bilateral pleural effusions and pulmonary interstitial edema. 2. Unchanged volume loss in the right hemithorax. Additional patchy airspace opacities in both lungs better seen on the same-day CT chest. Electronically  Signed   By: Lesia Hausen M.D.   On: 09/08/2023 11:44   DG Chest Port 1 View  Result Date: 08/22/2023 CLINICAL DATA:  Short of breath, weakness, headache, history of Hodgkin lymphoma EXAM: PORTABLE CHEST 1 VIEW COMPARISON:  06/15/2023, 12/09/2021 FINDINGS: Single frontal view of the chest demonstrates stable left chest wall port via internal jugular approach. Cardiac silhouette is enlarged. Chronic areas of scarring are seen within the right chest, with associated volume loss. Patchy left upper lobe consolidation seen on recent PET scan again noted and unchanged. No effusion or pneumothorax. No acute bony abnormalities. IMPRESSION: 1. Continued patchy left upper lobe airspace disease, corresponding to the hypermetabolic consolidation on recent PET scan and concerning for recurrent lymphoma. 2. Chronic scarring within the right chest with associated volume loss. 3. Stable enlarged cardiac silhouette. Electronically Signed   By: Sharlet Salina M.D.   On: 08/22/2023 18:37    Microbiology: Results for orders placed or performed during the hospital encounter of 09/08/23  Resp panel by RT-PCR (RSV, Flu A&B, Covid) Anterior Nasal Swab     Status: None   Collection Time: 09/08/23 10:32 AM   Specimen: Anterior Nasal Swab  Result Value Ref Range Status   SARS Coronavirus 2 by RT PCR NEGATIVE NEGATIVE Final    Comment: (NOTE) SARS-CoV-2 target nucleic acids are NOT DETECTED.  The SARS-CoV-2 RNA is generally detectable in upper respiratory specimens during the acute phase of infection. The lowest concentration of SARS-CoV-2 viral copies this assay can detect is 138 copies/mL. A negative result does not preclude SARS-Cov-2 infection and should not be used as the sole basis for treatment or other Heidi management decisions. A negative result may occur with  improper specimen collection/handling, submission of specimen other than nasopharyngeal swab, presence of viral mutation(s) within the areas targeted  by this assay, and inadequate number of viral copies(<138 copies/mL). A negative result must be combined with clinical observations, Heidi history, and epidemiological information. The expected result is Negative.  Fact Sheet for Patients:  BloggerCourse.com  Fact Sheet for Healthcare Providers:  SeriousBroker.it  This test is no t yet approved or cleared by the [place] FDA and  has been authorized for detection and/or diagnosis of SARS-CoV-2 by FDA under an Emergency Use Authorization (EUA). This EUA will remain  in effect (meaning this test can be used) for the duration of the COVID-19 declaration under Section 564(b)(1) of the Act, 21 U.S.C.section 360bbb-3(b)(1), unless the authorization is terminated  or revoked sooner.       Influenza A by PCR NEGATIVE NEGATIVE Final   Influenza B by PCR NEGATIVE NEGATIVE Final    Comment: (NOTE) The Xpert Xpress SARS-CoV-2/FLU/RSV plus assay is intended  as an aid in the diagnosis of influenza from Nasopharyngeal swab specimens and should not be used as a sole basis for treatment. Nasal washings and aspirates are unacceptable for Xpert Xpress SARS-CoV-2/FLU/RSV testing.  Fact Sheet for Patients: BloggerCourse.com  Fact Sheet for Healthcare Providers: SeriousBroker.it  This test is not yet approved or cleared by the [place] FDA and has been authorized for detection and/or diagnosis of SARS-CoV-2 by FDA under an Emergency Use Authorization (EUA). This EUA will remain in effect (meaning this test can be used) for the duration of the COVID-19 declaration under Section 564(b)(1) of the Act, 21 U.S.C. section 360bbb-3(b)(1), unless the authorization is terminated or revoked.     Resp Syncytial Virus by PCR NEGATIVE NEGATIVE Final    Comment: (NOTE) Fact Sheet for Patients: BloggerCourse.com  Fact  Sheet for Healthcare Providers: SeriousBroker.it  This test is not yet approved or cleared by the [place] FDA and has been authorized for detection and/or diagnosis of SARS-CoV-2 by FDA under an Emergency Use Authorization (EUA). This EUA will remain in effect (meaning this test can be used) for the duration of the COVID-19 declaration under Section 564(b)(1) of the Act, 21 U.S.C. section 360bbb-3(b)(1), unless the authorization is terminated or revoked.  Performed at [facility], [address], [place], [place] [address]   Blood Culture (routine x 2)     Status: None (Preliminary result)   Collection Time: 09/08/23 10:41 AM   Specimen: BLOOD  Result Value Ref Range Status   Specimen Description   Final    BLOOD PORTA CATH Performed at [facility], [address] [place], [place] [address]    Special Requests   Final    BOTTLES DRAWN AEROBIC AND ANAEROBIC Blood Culture adequate volume Performed at [facility], [address] [place], [place] [address]    Culture   Final    NO GROWTH 2 DAYS Performed at [facility], [address] [place], [place] [address]    Report Status PENDING  Incomplete  Blood Culture (routine x 2)     Status: None (Preliminary result)   Collection Time: 09/08/23  9:10 PM   Specimen: BLOOD LEFT ARM  Result Value Ref Range Status   Specimen Description   Final    BLOOD LEFT ARM Performed at [facility], [address], [place], [place] [address]    Special Requests   Final    BOTTLES DRAWN AEROBIC AND ANAEROBIC Blood Culture adequate volume Performed at [facility], [address], [place], [place] [address]    Culture   Final    NO GROWTH 2 DAYS Performed at [facility], [address], [place], [place] [address]    Report Status PENDING  Incomplete    Labs: CBC: Recent Labs  Lab 09/08/23 0922  09/08/23 1042 09/09/23 0500 09/09/23 1117 09/09/23 2155 09/10/23 0550  WBC 35.4*  --  30.2* 32.6* 31.1* 26.7*  NEUTROABS 29.6*  --   --   --  27.7*  --   HGB 7.7* 7.8* 7.0* 7.0* 7.2* 8.0*  HCT 22.9* 23.0* 21.8* 21.8* 22.3* 24.5*  MCV 89.1  --  94.4 94.8 94.1 93.2  PLT 39*  --  38* 42* 52* 51*   Basic Metabolic Panel: Recent Labs  Lab 09/08/23 0922 09/08/23 1042 09/09/23 0500 09/09/23 2155 09/10/23 0530 09/10/23 0550  NA 136 134* 136 136  --  134*  K 3.8 3.5  3.4* 2.9*  --  2.9*  CL 99 96* 101 98  --  95*  CO2 29  --  28 27  --  27  GLUCOSE 127* 156* 109* 110*  --  115*  BUN 7 5* 6 7  --  6  CREATININE 0.79 0.70 0.64 0.75  --  0.80  CALCIUM 9.0  --  8.4* 8.6*  --  8.2*  MG  --   --   --   --  1.7  --    Liver Function Tests: Recent Labs  Lab 09/08/23 0922 09/09/23 2155 09/10/23 0550  AST 14* 17 15  ALT 10 16 15   ALKPHOS 141* 141* 151*  BILITOT 1.2 1.5* 1.1  PROT 6.3* 6.7 6.6  ALBUMIN 4.0 3.7 3.6   CBG: No results for input(s): "GLUCAP" in the last 168 hours.  Discharge time spent: less than 30 minutes.  Signed: Carollee Herter, DO [facility] 09/10/2023

## 2023-09-10 NOTE — Hospital Course (Signed)
Heidi [name] is a 37 y.o. female with medical history significant for refractory mixed cellularity Hodgkin's lymphoma (with extensive lymphadenopathy including the right axillary, mediastinal, upper retroperitoneal, and biopsy-proven pulmonary involvement), bilateral pleural effusion, pericardial effusion, anemia and thrombocytopenia, tobacco use, and medical nonadherence who is admitted with acute hypoxic respiratory failure and symptomatic anemia.

## 2023-09-10 NOTE — ED Notes (Signed)
Pt leaving AMA, form signed and placed in chart. Pt made aware of all risks associated with leaving AMA including permanent disability/death, pt verbalized understanding and still expresses desire to leave AMA. Pt is awake, alert and oriented x4.

## 2023-09-10 NOTE — Progress Notes (Signed)
   Informed by ED RN that pt wants to leave AMA. This Clinical research associate engaged in other patient care issues with other patients in the hospital.  RN to document pt's mentation(I.e. AxOx4) and have patient sign AMA form that states pt understands risks of leaving AMA, to include but not limited to death from leaving Against Medical Advice.  Carollee Herter, DO [facility]

## 2023-09-10 NOTE — ED Notes (Signed)
One unit of blood completed

## 2023-09-10 NOTE — ED Notes (Signed)
Pt requested update on status, advised she is admitted and waiting on bed assignment, pt expresses desire to leave. Does not want to stay and be admitted, offered to have MD come to bedside and answer any questions, pt refused. Updated pt on plan of care and reason for admission, pt verbalized understanding and still expresses desire to leave. MD Imogene Burn made aware. Unable to come speak to pt at this time, and recommends documenting AMA conversation.

## 2023-09-10 NOTE — ED Notes (Signed)
As per Dr. Princess Bruins pt should get blood and then antibiotics

## 2023-09-10 NOTE — H&P (Signed)
History and Physical    Heidi [name] WUX:[id number] DOB: 11-May-1986 DOA: 09/09/2023  PCP: Pcp, No  Patient coming from: Home  I have personally briefly reviewed patient's old medical records in [facility]  Chief Complaint: Dizziness  HPI: Heidi [name] is a 36 y.o. female with medical history significant for refractory mixed cellularity Hodgkin's lymphoma (with extensive lymphadenopathy including the right axillary, mediastinal, upper retroperitoneal, and biopsy-proven pulmonary involvement), bilateral pleural effusion, pericardial effusion, anemia and thrombocytopenia, tobacco use, and medical nonadherence who returns to the ED for evaluation of dizziness.  Patient was admitted yesterday 9/12 for acute hypoxic respiratory failure in setting of bilateral pleural effusion, pericardial effusion, intrathoracic malignancy.  She had leukocytosis, some of this attributed to receiving pegfilgrastim injection on 9/7.  She was placed on empiric antibiotics as bacterial pneumonia cannot be ruled out.  IR was consulted for thoracentesis however this was not performed due to no sufficient pleural fluid.  She was planned to receive 1 unit PRBC transfusion today however patient left AGAINST MEDICAL ADVICE because she stated that she was feeling better.  She returned home, initially was not feeling short of breath.  While taking a shower she became very lightheaded and thought she was in a pass out.  She did not lose consciousness or fall.  She came back to the ED for further evaluation.  She reports continued nonproductive cough.  She will get short of breath with mild exertion.  She denies chest pain, palpitations, nausea, vomiting, abdominal pain, dysuria.  She has not seen any swelling in her extremities.  Patient states that she has not smoked for the last 2 days.  ED Course  Labs/Imaging on admission: I have personally reviewed following labs and imaging studies.  Initial vitals  showed BP 97/65, pulse 129, RR 31, temp 99.0 F, SpO2 86% on room air.  Improved to 99% on 3 L O2 via [place].  Labs show WBC 31.1, hemoglobin 7.2, platelets 52,000, sodium 136, potassium 2.9, bicarb 27, BUN 7, creatinine 0.75, serum glucose 110, lactic acid 1.4.  Portable chest x-ray shows similar central vascular congestion.  Stable volume loss on the right is noted, similar to prior exam.  Left chest wall port seen in place.  Patient was given IV morphine.  1 unit PRBC was ordered and pending transfusion.  The hospitalist service was consulted to admit for further evaluation and management.  Review of Systems: All systems reviewed and are negative except as documented in history of present illness above.   Past Medical History:  Diagnosis Date   ARDS (adult respiratory distress syndrome) (HCC)    Asthma    Hodgkin lymphoma (HCC)    Hypertension     Past Surgical History:  Procedure Laterality Date   AXILLARY LYMPH NODE BIOPSY Right 03/19/2016   Procedure: AXILLARY LYMPH NODE BIOPSY;  Surgeon: Darnell Level, MD;  Location: [facility];  Service: General;  Laterality: Right;   IR GENERIC HISTORICAL  12/22/2016   IR FLUORO GUIDE PORT INSERTION LEFT 12/22/2016 [facility]   IR GENERIC HISTORICAL  12/22/2016   IR US GUIDE VASC ACCESS LEFT 12/22/2016 [facility]   IR GENERIC HISTORICAL  12/22/2016   IR CV LINE INJECTION 12/22/2016 [facility]   IR GENERIC HISTORICAL  12/22/2016   IR REMOVAL TUN ACCESS W/ PORT W/O FL MOD SED 12/22/2016 [facility]   VIDEO BRONCHOSCOPY Bilateral 11/26/2016   Procedure: VIDEO BRONCHOSCOPY WITH FLUORO;  Surgeon: Oretha Milch, MD;  Location: [facility];  Service: Cardiopulmonary;  Laterality: Bilateral;    Social History:  reports that she has been smoking cigarettes. She started smoking about 22 years ago. She has a 7.5 pack-year smoking history. She has never used smokeless tobacco. She reports current alcohol use. She reports that she does not currently  use drugs.  Allergies  Allergen Reactions   Doxorubicin Hcl Liposomal Anaphylaxis, Shortness Of Breath and Other (See Comments)    Pt had severe hypersensitivity reaction with chest pain and SOB immediately following start of medication. See hypersensitivity note from 07/14/2023.    History reviewed. No pertinent family history.   Prior to Admission medications   Medication Sig Start Date End Date Taking? Authorizing Provider  TYLENOL 500 MG tablet Take 500 mg by mouth every 6 (six) hours as needed for mild pain or headache.   Yes [provider]  furosemide (LASIX) 20 MG tablet Take 1 tablet (20 mg total) by mouth daily for 5 days. 07/14/23 09/10/23  Walisiewicz, Yvonna Alanis E, PA-C  gabapentin (NEURONTIN) 300 MG capsule Take 1 capsule (300 mg total) by mouth at bedtime. 05/27/23   [name], MD  HYDROcodone-acetaminophen (NORCO/VICODIN) 5-325 MG tablet Take 1 tablet by mouth every 6 (six) hours as needed. 07/09/23   Barrie Dunker B, PA-C  lidocaine (LIDODERM) 5 % Place 1 patch onto the skin daily. Remove & Discard patch within 12 hours or as directed by MD 03/29/23   Prosperi, Ephriam Knuckles H, PA-C  lidocaine-prilocaine (EMLA) cream Apply to affected area once 06/30/23   [name], MD  ondansetron (ZOFRAN) 8 MG tablet Take 1 tablet (8 mg total) by mouth every 8 (eight) hours as needed for nausea or vomiting. 06/30/23   [name], MD  prochlorperazine (COMPAZINE) 10 MG tablet Take 1 tablet (10 mg total) by mouth every 6 (six) hours as needed for nausea or vomiting. 06/30/23   [name], MD    Physical Exam: Vitals:   09/09/23 2106 09/09/23 2121 09/09/23 2249 09/10/23 0000  BP: 97/65 100/65 109/70 96/71  Pulse: (!) 129 (!) 127 (!) 120 (!) 128  Resp:  17 (!) 25 (!) 22  Temp: 99 F (37.2 C)  98.5 F (36.9 C)   TempSrc: Oral  Oral   SpO2: 96% 90% 99% 98%  Weight: 64.4 kg     Height: 5\' 7"  (1.702 m)      Constitutional: Resting in bed with head  elevated, NAD, calm, comfortable Eyes: EOMI, lids and conjunctivae normal ENMT: Mucous membranes are moist. Posterior pharynx clear of any exudate or lesions.Normal dentition.  Neck: normal, supple, no masses. Respiratory: clear to auscultation bilaterally, no wheezing, no crackles. Normal respiratory effort while on 3 L O2 via [place]. No accessory muscle use.  Cardiovascular: Tachycardic, no murmurs / rubs / gallops. No extremity edema. 2+ pedal pulses. Abdomen: no tenderness, no masses palpated.  Musculoskeletal: no clubbing / cyanosis. No joint deformity upper and lower extremities. Good ROM, no contractures. Normal muscle tone.  Skin: no rashes, lesions, ulcers. No induration Neurologic: Sensation intact. Strength 5/5 in all 4.  Psychiatric: Alert and oriented x 3. Normal mood.   EKG: Personally reviewed. Sinus tachycardia, rate 136, no acute ischemic changes.  Similar to previous.  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 female with medical history significant for refractory mixed cellularity Hodgkin's lymphoma (with extensive lymphadenopathy including the right axillary, mediastinal, upper retroperitoneal, and biopsy-proven pulmonary involvement), bilateral pleural effusion, pericardial effusion, anemia and thrombocytopenia, tobacco use, and medical nonadherence who is admitted with acute hypoxic respiratory failure and symptomatic anemia.  Assessment and Plan: Acute respiratory failure with hypoxia: Same-day readmission after leaving AMA on 9/13.  SpO2 86% on room air, stable on 3 L O2 via [place].  Multiple potential causes for hypoxia including pulmonary edema, atypical pneumonia, hypersensitivity  pneumonitis, pleural effusions, anemia. -Continue empiric antibiotics with IV ceftriaxone and azithromycin -Blood cultures 9/12 NGTD -COVID, flu, RSV negative -Received Lasix earlier today, repeat potassium and consider redosing based on clinical progress -Continue supplemental oxygen and wean as able  Symptomatic anemia: Hemoglobin 7.2.  Anemia felt related to chemotherapy and active malignancy. -Transfuse 1 unit PRBC  Bilateral pleural effusion: IR was consulted for thoracentesis however not performed due to insufficient pleural fluid.  Pericardial effusion: TTE 9/12 showed small to moderate circumferential pericardial effusion, no obvious tamponade.    Refractory/progressive mixed cellularity Hodgkin's lymphoma: Follows with oncology Dr. Candise Che.  On active treatment with gemcitabine/pembrolizumab/Vinorelbine.  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  Leukocytosis: Significant leukocytosis probably related to pegfilgrastim injection on 9/7.  On empiric antibiotics as above for potential pneumonia.  Hypokalemia: Supplementing, check magnesium and supplement as needed.  Thrombocytopenia: Patient denies obvious bleeding.  Likely secondary to chemotherapy.  Continue to monitor.   DVT prophylaxis: SCDs Start: 09/10/23 0053 Code Status: Full code, confirmed with patient on admission Family Communication: Son at bedside Disposition Plan: From home, dispo pending clinical progress Consults called: None Severity of Illness: The appropriate patient status for this patient is INPATIENT. Inpatient status is judged to be reasonable and necessary in order to provide the required intensity of service to ensure the patient's safety. The patient's presenting symptoms, physical exam findings, and initial radiographic and laboratory data in the context of their chronic comorbidities is felt to place them at high  risk for further clinical deterioration. Furthermore, it is not anticipated that the patient will be medically stable for discharge from the hospital within 2 midnights of admission.   * I certify that at the point of admission it is my clinical judgment that the patient will require inpatient hospital care spanning beyond 2 midnights from the point of admission due to high intensity of service, high risk for further deterioration and high frequency of surveillance required.Darreld Mclean MD [facility]  If 7PM-7AM, please contact night-coverage [email or web address]  09/10/2023, 1:06 AM

## 2023-09-12 LAB — TYPE AND SCREEN
ABO/RH(D): A POS
Antibody Screen: NEGATIVE
Unit division: 0

## 2023-09-12 LAB — BPAM RBC
Blood Product Expiration Date: 202410132359
ISSUE DATE / TIME: 202409140207
Unit Type and Rh: 6200

## 2023-09-13 ENCOUNTER — Other Ambulatory Visit: Payer: Self-pay

## 2023-09-13 LAB — CULTURE, BLOOD (ROUTINE X 2)
Culture: NO GROWTH
Culture: NO GROWTH
Special Requests: ADEQUATE
Special Requests: ADEQUATE

## 2023-09-14 ENCOUNTER — Inpatient Hospital Stay (HOSPITAL_BASED_OUTPATIENT_CLINIC_OR_DEPARTMENT_OTHER): Payer: Medicaid Other | Admitting: Physician Assistant

## 2023-09-14 ENCOUNTER — Encounter: Payer: Self-pay | Admitting: Hematology

## 2023-09-14 ENCOUNTER — Inpatient Hospital Stay: Payer: Medicaid Other

## 2023-09-14 ENCOUNTER — Inpatient Hospital Stay: Payer: Self-pay | Admitting: Nurse Practitioner

## 2023-09-14 ENCOUNTER — Other Ambulatory Visit: Payer: Self-pay | Admitting: Hematology

## 2023-09-14 VITALS — BP 128/93 | HR 98 | Temp 98.0°F | Resp 20 | Wt 141.5 lb

## 2023-09-14 DIAGNOSIS — Z5982 Transportation insecurity: Secondary | ICD-10-CM | POA: Diagnosis not present

## 2023-09-14 DIAGNOSIS — Z91199 Patient's noncompliance with other medical treatment and regimen due to unspecified reason: Secondary | ICD-10-CM | POA: Diagnosis not present

## 2023-09-14 DIAGNOSIS — C8128 Mixed cellularity classical Hodgkin lymphoma, lymph nodes of multiple sites: Secondary | ICD-10-CM | POA: Diagnosis present

## 2023-09-14 DIAGNOSIS — Z515 Encounter for palliative care: Secondary | ICD-10-CM | POA: Diagnosis not present

## 2023-09-14 DIAGNOSIS — Z79633 Long term (current) use of mitotic inhibitor: Secondary | ICD-10-CM | POA: Diagnosis not present

## 2023-09-14 DIAGNOSIS — R0602 Shortness of breath: Secondary | ICD-10-CM | POA: Diagnosis not present

## 2023-09-14 DIAGNOSIS — Z7189 Other specified counseling: Secondary | ICD-10-CM

## 2023-09-14 DIAGNOSIS — D649 Anemia, unspecified: Secondary | ICD-10-CM | POA: Diagnosis not present

## 2023-09-14 DIAGNOSIS — C8198 Hodgkin lymphoma, unspecified, lymph nodes of multiple sites: Secondary | ICD-10-CM | POA: Diagnosis not present

## 2023-09-14 DIAGNOSIS — I3139 Other pericardial effusion (noninflammatory): Secondary | ICD-10-CM | POA: Diagnosis not present

## 2023-09-14 DIAGNOSIS — Z5111 Encounter for antineoplastic chemotherapy: Secondary | ICD-10-CM | POA: Diagnosis present

## 2023-09-14 DIAGNOSIS — Z95828 Presence of other vascular implants and grafts: Secondary | ICD-10-CM

## 2023-09-14 DIAGNOSIS — F17210 Nicotine dependence, cigarettes, uncomplicated: Secondary | ICD-10-CM | POA: Diagnosis not present

## 2023-09-14 DIAGNOSIS — Z79631 Long term (current) use of antimetabolite agent: Secondary | ICD-10-CM | POA: Diagnosis not present

## 2023-09-14 DIAGNOSIS — R0902 Hypoxemia: Secondary | ICD-10-CM | POA: Diagnosis not present

## 2023-09-14 DIAGNOSIS — Z79899 Other long term (current) drug therapy: Secondary | ICD-10-CM | POA: Diagnosis not present

## 2023-09-14 LAB — CBC WITH DIFFERENTIAL (CANCER CENTER ONLY)
Abs Immature Granulocytes: 4.37 10*3/uL — ABNORMAL HIGH (ref 0.00–0.07)
Basophils Absolute: 0.1 10*3/uL (ref 0.0–0.1)
Basophils Relative: 1 %
Eosinophils Absolute: 0.2 10*3/uL (ref 0.0–0.5)
Eosinophils Relative: 1 %
HCT: 25.7 % — ABNORMAL LOW (ref 36.0–46.0)
Hemoglobin: 8.4 g/dL — ABNORMAL LOW (ref 12.0–15.0)
Immature Granulocytes: 21 %
Lymphocytes Relative: 2 %
Lymphs Abs: 0.3 10*3/uL — ABNORMAL LOW (ref 0.7–4.0)
MCH: 30.4 pg (ref 26.0–34.0)
MCHC: 32.7 g/dL (ref 30.0–36.0)
MCV: 93.1 fL (ref 80.0–100.0)
Monocytes Absolute: 1.2 10*3/uL — ABNORMAL HIGH (ref 0.1–1.0)
Monocytes Relative: 6 %
Neutro Abs: 15.1 10*3/uL — ABNORMAL HIGH (ref 1.7–7.7)
Neutrophils Relative %: 69 %
Platelet Count: 156 10*3/uL (ref 150–400)
RBC: 2.76 MIL/uL — ABNORMAL LOW (ref 3.87–5.11)
RDW: 20.3 % — ABNORMAL HIGH (ref 11.5–15.5)
WBC Count: 21.4 10*3/uL — ABNORMAL HIGH (ref 4.0–10.5)
nRBC: 0.7 % — ABNORMAL HIGH (ref 0.0–0.2)

## 2023-09-14 LAB — CMP (CANCER CENTER ONLY)
ALT: 34 U/L (ref 0–44)
AST: 26 U/L (ref 15–41)
Albumin: 3.9 g/dL (ref 3.5–5.0)
Alkaline Phosphatase: 169 U/L — ABNORMAL HIGH (ref 38–126)
Anion gap: 7 (ref 5–15)
BUN: 9 mg/dL (ref 6–20)
CO2: 31 mmol/L (ref 22–32)
Calcium: 8.8 mg/dL — ABNORMAL LOW (ref 8.9–10.3)
Chloride: 101 mmol/L (ref 98–111)
Creatinine: 0.75 mg/dL (ref 0.44–1.00)
GFR, Estimated: >60 mL/min (ref >60)
Glucose, Bld: 103 mg/dL — ABNORMAL HIGH (ref 70–99)
Potassium: 3.5 mmol/L (ref 3.5–5.1)
Sodium: 139 mmol/L (ref 135–145)
Total Bilirubin: 0.9 mg/dL (ref 0.3–1.2)
Total Protein: 6.1 g/dL — ABNORMAL LOW (ref 6.5–8.1)

## 2023-09-14 MED ORDER — HEPARIN SOD (PORK) LOCK FLUSH 100 UNIT/ML IV SOLN
500.0000 [IU] | Freq: Once | INTRAVENOUS | Status: AC | PRN
  Administered 2023-09-14: 500 [IU]

## 2023-09-14 MED ORDER — ALTEPLASE 2 MG IJ SOLR: 2.0000 mg | Freq: Once | INTRAMUSCULAR | Status: DC | PRN

## 2023-09-14 MED ORDER — SODIUM CHLORIDE 0.9% FLUSH
10.0000 mL | INTRAVENOUS | Status: DC | PRN
  Administered 2023-09-14: 10 mL

## 2023-09-14 NOTE — Progress Notes (Signed)
Symptom Management Consult Note [facility]    Patient Care Team: Pcp, No as PCP - General    Name / MRN / DOBSabryna [name]  [id number]  03-Dec-1986   Date of visit: 09/14/2023   Chief Complaint/Reason for visit: Hospital follow up   Current Therapy: Gemzar, Navelbine  Last treatment:  Day 8   Cycle 3 on 09/01/23   ASSESSMENT & PLAN: Patient is a 37 y.o. female with oncologic history of refractory mixed cellularity Hodgkin's lymphoma IVBE followed by Dr. Candise Che.  I have viewed most recent oncology note and lab work.    #Refractory mixed cellularity Hodgkin's lymphoma IVBE  - Treatment today will be held per MD. Plan for restaging PET and then treatment options will be discussed. -Patient given phone number for Central Scheduling and knows to call today to get image scheduled.  #Anemia -Hemoglobin stable at 8.4, no indication for blood transfusion. -Vitals stable. Clear lung exam.  #Pericardial effusion -Found during ED visit on 09/08/23. Per chart review bedside echo by EDP was without evidence of obvious tamponade physiology. Plan was for cardiology consult however patient left before this happened.  -Discussed severity of pericardial effusions and the dangers it can cause. Discussed symptoms she needs to watch for and the importance of ED evaluation if so. This was a lengthy discussion and patient reported understanding.     Heme/Onc History: Oncology History  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)  12/14/2016 Initial Diagnosis   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)   10/09/2018 - 11/17/2018 Chemotherapy   The patient had pegfilgrastim (NEULASTA) injection 6 mg, 6 mg, Subcutaneous, Once, 2 of 3 cycles Administration: 6 mg (10/13/2018), 6 mg (11/17/2018) ondansetron (ZOFRAN) 16 mg, dexamethasone (DECADRON) 20 mg in sodium chloride 0.9 % 50 mL IVPB, , Intravenous,  Once, 2  of 3 cycles Administration: 16 mg (10/09/2018), 8 mg (10/10/2018), 36 mg (10/11/2018), 16 mg (11/14/2018), 10 mg (11/15/2018), 36 mg (11/16/2018) CARBOplatin (PARAPLATIN) 610 mg in sodium chloride 0.9 % 250 mL chemo infusion, 610 mg (100 % of original dose 608.5 mg), Intravenous,  Once, 2 of 3 cycles Dose modification:   (original dose 608.5 mg, Cycle 1) Administration: 610 mg (10/10/2018), 610 mg (11/15/2018) etoposide (VEPESID) 170 mg in sodium chloride 0.9 % 500 mL chemo infusion, 100 mg/m2 = 170 mg, Intravenous,  Once, 2 of 3 cycles Administration: 170 mg (10/09/2018), 170 mg (10/10/2018), 170 mg (10/11/2018), 170 mg (11/14/2018), 170 mg (11/15/2018), 170 mg (11/16/2018) ifosfamide (IFEX) 8,450 mg, mesna (MESNEX) 8,450 mg in sodium chloride 0.9 % 1,000 mL chemo infusion, , Intravenous,  Once, 2 of 3 cycles Administration:  (10/10/2018),  (11/15/2018)  for chemotherapy treatment.    07/14/2023 -  Chemotherapy   Patient is on Treatment Plan : HODGKINS LYMPHOMA (RELAPSED/REFRACTORY) PEMBROLIZUMAB D1 + GEMCITABINE D1,8 + VINORELBINE D1,8 + LIPOSOMAL DOXORUBICIN D1,8 Q21D     Hodgkin's lymphoma (HCC)  11/14/2018 Initial Diagnosis   Hodgkin's lymphoma (HCC)   01/09/2019 - 02/18/2021 Chemotherapy   Patient is on Treatment Plan : HEAD/NECK Pembrolizumab Q21D         Interval history-: Heidi [name] is a 37 y.o. female with oncologic history as above presenting to [facility] today with chief complaint of hospital follow-up.  She presents unaccompanied to the infusion center today.  Patient states she is here for treatment today.  She was in the hospital last week and left AMA twice.  Patient states that she left because she did not feel comfortable with what the doctors were saying.  Patient reports repeatedly that she knows her body best.  She does admit to intermittent shortness of breath although it is much improved since clinic visit last week.  She does have a nonproductive cough which has been  ongoing for several weeks.  She denies any fevers, chills, chest pain, dizziness, syncope, palpitations. She feels a lot better compared to when she was in clinic last week.    ROS  All other systems are reviewed and are negative for acute change except as noted in the HPI.    Allergies  Allergen Reactions   Doxorubicin Hcl Liposomal Anaphylaxis, Shortness Of Breath and Other (See Comments)    Pt had severe hypersensitivity reaction with chest pain and SOB immediately following start of medication. See hypersensitivity note from 07/14/2023.     Past Medical History:  Diagnosis Date   ARDS (adult respiratory distress syndrome) (HCC)    Asthma    Hodgkin lymphoma (HCC)    Hypertension      Past Surgical History:  Procedure Laterality Date   AXILLARY LYMPH NODE BIOPSY Right 03/19/2016   Procedure: AXILLARY LYMPH NODE BIOPSY;  Surgeon: Darnell Level, MD;  Location: [facility];  Service: General;  Laterality: Right;   IR GENERIC HISTORICAL  12/22/2016   IR FLUORO GUIDE PORT INSERTION LEFT 12/22/2016 [facility]   IR GENERIC HISTORICAL  12/22/2016   IR US GUIDE VASC ACCESS LEFT 12/22/2016 [facility]   IR GENERIC HISTORICAL  12/22/2016   IR CV LINE INJECTION 12/22/2016 [facility]   IR GENERIC HISTORICAL  12/22/2016   IR REMOVAL TUN ACCESS W/ PORT W/O FL MOD SED 12/22/2016 [facility]   VIDEO BRONCHOSCOPY Bilateral 11/26/2016   Procedure: VIDEO BRONCHOSCOPY WITH FLUORO;  Surgeon: Oretha Milch, MD;  Location[facility];  Service: Cardiopulmonary;  Laterality: Bilateral;    Social History   Socioeconomic History   Marital status: Single    Spouse name: Not on file   Number of children: Not on file   Years of education: Not on file   Highest education level: Not on file  Occupational History   Not on file  Tobacco Use   Smoking status: Some Days    Current packs/day: 0.00    Average packs/day: 0.5 packs/day for 15.0 years (7.5 ttl pk-yrs)    Types: Cigarettes     Start date: 03/27/2001    Last attempt to quit: 03/27/2016    Years since quitting: 7.4   Smokeless tobacco: Never  Vaping Use   Vaping status: Never Used  Substance and Sexual Activity   Alcohol use: Yes    Comment: occasional now   Drug use: Not Currently   Sexual activity: Yes    Birth control/protection: None  Other Topics Concern   Not on file  Social History Narrative   Not on file   Social Determinants of Health   Financial Resource Strain: Not on file  Food Insecurity: No Food Insecurity (09/08/2023)   Hunger Vital Sign    Worried About Running Out of Food in the Last Year: Never true    Ran Out of Food in the Last Year: Never true  Transportation Needs: No Transportation Needs (09/08/2023)   PRAPARE - Administrator, Civil Service (Medical): No    Lack of Transportation (Non-Medical): No  Physical Activity: Not on file  Stress: Not on file  Social Connections: Not on  file  Intimate Partner Violence: Not At Risk (09/08/2023)   Humiliation, Afraid, Rape, and Kick questionnaire    Fear of Current or Ex-Partner: No    Emotionally Abused: No    Physically Abused: No    Sexually Abused: No    No family history on file.   Current Outpatient Medications:    TYLENOL 500 MG tablet, Take 500 mg by mouth every 6 (six) hours as needed for mild pain or headache., Disp: , Rfl:  No current facility-administered medications for this visit.  Facility-Administered Medications Ordered in Other Visits:    alteplase (CATHFLO ACTIVASE) injection 2 mg, 2 mg, Intracatheter, Once PRN, Candise Che, Corene Cornea, MD   sodium chloride flush (NS) 0.9 % injection 10 mL, 10 mL, Intracatheter, PRN, Candise Che, Corene Cornea, MD  PHYSICAL EXAM: ECOG FS:1 - Symptomatic but completely ambulatory    Vitals:   09/14/23 0900  BP: (!) 128/93  Pulse: 98  Resp: 20  Temp: 98 F (36.7 C)  TempSrc: Oral  SpO2: 95%  Weight: 141 lb 8 oz (64.2 kg)   Physical Exam Vitals and nursing note  reviewed.  Constitutional:      Appearance: She is not ill-appearing or toxic-appearing.  HENT:     Head: Normocephalic.  Eyes:     Conjunctiva/sclera: Conjunctivae normal.  Cardiovascular:     Rate and Rhythm: Normal rate and regular rhythm.     Pulses: Normal pulses.     Heart sounds: Normal heart sounds.  Pulmonary:     Effort: Pulmonary effort is normal.     Breath sounds: Normal breath sounds.  Abdominal:     General: There is no distension.  Musculoskeletal:     Cervical back: Normal range of motion.     Right lower leg: No edema.     Left lower leg: No edema.  Skin:    General: Skin is warm and dry.  Neurological:     Mental Status: She is alert.        LABORATORY DATA: I have reviewed the data as listed    Latest Ref Rng & Units 09/14/2023    8:21 AM 09/10/2023    5:50 AM 09/09/2023    9:55 PM  CBC  WBC 4.0 - 10.5 K/uL 21.4  26.7  31.1   Hemoglobin 12.0 - 15.0 g/dL 8.4  8.0  7.2   Hematocrit 36.0 - 46.0 % 25.7  24.5  22.3   Platelets 150 - 400 K/uL 156  51  52         Latest Ref Rng & Units 09/14/2023    8:21 AM 09/10/2023    5:50 AM 09/09/2023    9:55 PM  CMP  Glucose 70 - 99 mg/dL 347  425  956   BUN 6 - 20 mg/dL 9  6  7    Creatinine 0.44 - 1.00 mg/dL 3.87  5.64  3.32   Sodium 135 - 145 mmol/L 139  134  136   Potassium 3.5 - 5.1 mmol/L 3.5  2.9  2.9   Chloride 98 - 111 mmol/L 101  95  98   CO2 22 - 32 mmol/L 31  27  27    Calcium 8.9 - 10.3 mg/dL 8.8  8.2  8.6   Total Protein 6.5 - 8.1 g/dL 6.1  6.6  6.7   Total Bilirubin 0.3 - 1.2 mg/dL 0.9  1.1  1.5   Alkaline Phos 38 - 126 U/L 169  151  141   AST 15 -  41 U/L 26  15  17    ALT 0 - 44 U/L 34  15  16        RADIOGRAPHIC STUDIES (from last 24 hours if applicable) I have personally reviewed the radiological images as listed and agreed with the findings in the report. No results found.      Visit Diagnosis: 1. Hodgkin lymphoma of lymph nodes of multiple regions, unspecified Hodgkin lymphoma  type (HCC)   2. Normocytic anemia   3. Pericardial effusion      No orders of the defined types were placed in this encounter.   All questions were answered. The patient knows to call the clinic with any problems, questions or concerns. No barriers to learning was detected.  A total of more than 20 minutes were spent on this encounter with face-to-face time and non-face-to-face time, including preparing to see the patient, ordering tests and/or medications, counseling the patient and coordination of care as outlined above.    Thank you for allowing me to participate in the care of this patient.    Shanon Ace, PA-C Department of Hematology/Oncology [facility] Phone: [phone]  [phone]    09/14/2023 10:03 AM

## 2023-09-14 NOTE — Patient Instructions (Signed)
Please call the Central Scheduling Department to schedule your PET scan. The phone number is [phone]. You can call today to schedule it.  Dr. Candise Che will discuss the results with you to determine what is the best treatment option.

## 2023-09-15 ENCOUNTER — Ambulatory Visit: Payer: Medicaid Other

## 2023-09-15 ENCOUNTER — Other Ambulatory Visit: Payer: Medicaid Other

## 2023-09-21 ENCOUNTER — Inpatient Hospital Stay: Payer: Medicaid Other

## 2023-09-21 ENCOUNTER — Inpatient Hospital Stay: Payer: Medicaid Other | Admitting: Hematology

## 2023-09-22 ENCOUNTER — Ambulatory Visit (HOSPITAL_COMMUNITY)
Admission: RE | Admit: 2023-09-22 | Discharge: 2023-09-22 | Disposition: A | Discharge status: Home / Self Care | Payer: Medicaid Other | Source: Ambulatory Visit | Attending: Hematology | Admitting: Hematology

## 2023-09-22 ENCOUNTER — Encounter (HOSPITAL_COMMUNITY): Payer: Self-pay | Admitting: Radiology

## 2023-09-22 ENCOUNTER — Other Ambulatory Visit: Payer: Self-pay

## 2023-09-22 DIAGNOSIS — C8128 Mixed cellularity classical Hodgkin lymphoma, lymph nodes of multiple sites: Secondary | ICD-10-CM | POA: Diagnosis present

## 2023-09-22 LAB — GLUCOSE, CAPILLARY: Glucose-Capillary: 112 mg/dL — ABNORMAL HIGH (ref 70–99)

## 2023-09-22 MED ORDER — FLUDEOXYGLUCOSE F - 18 (FDG) INJECTION
7.0000 | Freq: Once | INTRAVENOUS | Status: AC
  Administered 2023-09-22: 6.91 via INTRAVENOUS

## 2023-09-23 ENCOUNTER — Inpatient Hospital Stay: Payer: Medicaid Other

## 2023-09-27 ENCOUNTER — Other Ambulatory Visit: Payer: Self-pay

## 2023-09-29 ENCOUNTER — Inpatient Hospital Stay: Payer: Medicaid Other | Admitting: Hematology

## 2023-09-29 ENCOUNTER — Inpatient Hospital Stay: Payer: Medicaid Other

## 2023-09-29 NOTE — Progress Notes (Incomplete)
HEMATOLOGY/ONCOLOGY CLINIC VISIT NOTE  Date of service:  09/29/23    Patient Care Team: Pcp, No as PCP - General Heidi Maine, MD as Consulting Physician (Hematology)  Chief complaint: Follow-up for Hodgkin's lymphoma  Diagnosis:   Refractory Mixed cellularity Hodgkin's lymphoma IVBE with extensive lymphadenopathy including right axillary, mediastinal and upper retroperitoneal and now biopsy proven pulmonary involvement. She was noted to have significant constitutional symptoms including significant weight loss, fevers chills and some night sweats.  Current Treatment:  Lost to f/u for about 2 yrs  Previous treatment  5 cycles of AVD (without bleomycin due to lung involvement and DLCO of 37%, active smoker) Multiple avoidable treatment delays due to the patient's noncompliance with follow-up for avoidable reasons. She has been counseled repeatedly that this would increase the likelihood of unfavorable outcome.  2nd line therapy with Bendamustine + Brentuximab s/p 7 cycles.  3rd line therapy with ICE s/p 2 cycles   Pembrolizumab- 4th line. Patient declined HDT/Auto HSCT or consideration of Car-T cell therapy.  INTERVAL HISTORY:  [name] is a 37 y.o. female here for follow-up for her Hodgkins lymphoma.   She was seen by me on 08/11/2023 and was doing well overall with no new medical concerns.   Patient was last seen by PA [facility] on 09/14/2023 and reported improved intermittent SOB. She coplained of nonproductive cough ongoing for several weeks.   Today,  -Discussed lab results on 09/29/23 in detail with patient. CBC showed WBC of ***K, hemoglobin of ***, and platelets of ***K. -    REVIEW OF SYSTEMS:   10 Point review of Systems was done is negative except as noted above.   Past Medical History:  Diagnosis Date   ARDS (adult respiratory distress syndrome) (HCC)    Asthma    Hodgkin lymphoma (HCC)    Hypertension     . Past Surgical History:   Procedure Laterality Date   AXILLARY LYMPH NODE BIOPSY Right 03/19/2016   Procedure: AXILLARY LYMPH NODE BIOPSY;  Surgeon: Darnell Level, MD;  Location: [facility];  Service: General;  Laterality: Right;   IR GENERIC HISTORICAL  12/22/2016   IR FLUORO GUIDE PORT INSERTION LEFT 12/22/2016 [facility]   IR GENERIC HISTORICAL  12/22/2016   IR US GUIDE VASC ACCESS LEFT 12/22/2016 [facility]   IR GENERIC HISTORICAL  12/22/2016   IR CV LINE INJECTION 12/22/2016 [facility]   IR GENERIC HISTORICAL  12/22/2016   IR REMOVAL TUN ACCESS W/ PORT W/O FL MOD SED 12/22/2016 [facility]   VIDEO BRONCHOSCOPY Bilateral 11/26/2016   Procedure: VIDEO BRONCHOSCOPY WITH FLUORO;  Surgeon: Oretha Milch, MD;  Location: [facility];  Service: Cardiopulmonary;  Laterality: Bilateral;    . Social History   Tobacco Use   Smoking status: Some Days    Current packs/day: 0.00    Average packs/day: 0.5 packs/day for 15.0 years (7.5 ttl pk-yrs)    Types: Cigarettes    Start date: 03/27/2001    Last attempt to quit: 03/27/2016    Years since quitting: 7.5   Smokeless tobacco: Never  Vaping Use   Vaping status: Never Used  Substance Use Topics   Alcohol use: Yes    Comment: occasional now   Drug use: Not Currently    ALLERGIES:  is allergic to doxorubicin hcl liposomal.  MEDICATIONS:  Current Outpatient Medications  Medication Sig Dispense Refill   TYLENOL 500 MG tablet Take 500 mg by mouth every 6 (six) hours as needed for mild pain or  headache.     No current facility-administered medications for this visit.   Facility-Administered Medications Ordered in Other Visits  Medication Dose Route Frequency Provider Last Rate Last Admin   sodium chloride flush (NS) 0.9 % injection 10 mL  10 mL Intracatheter PRN Heidi Maine, MD        PHYSICAL EXAMINATION:   ECOG FS:1 - Symptomatic but completely ambulatory  There were no vitals filed for this visit.   GENERAL:alert, in no acute distress and  comfortable SKIN: no acute rashes, no significant lesions EYES: conjunctiva are pink and non-injected, sclera anicteric OROPHARYNX: MMM, no exudates, no oropharyngeal erythema or ulceration NECK: supple, no JVD LYMPH:  no palpable lymphadenopathy in the cervical, axillary or inguinal regions LUNGS: clear to auscultation b/l with normal respiratory effort HEART: regular rate & rhythm ABDOMEN:  normoactive bowel sounds , non tender, not distended. Extremity: no pedal edema PSYCH: alert & oriented x 3 with fluent speech NEURO: no focal motor/sensory deficits   Wt Readings from Last 3 Encounters:  09/14/23 141 lb 8 oz (64.2 kg)  09/14/23 141 lb 8 oz (64.2 kg)  09/09/23 142 lb (64.4 kg)   There is no height or weight on file to calculate BMI.    LABORATORY DATA:   I have reviewed the data as listed     Latest Ref Rng & Units 09/14/2023    8:21 AM 09/10/2023    5:50 AM 09/09/2023    9:55 PM  CBC  WBC 4.0 - 10.5 K/uL 21.4  26.7  31.1   Hemoglobin 12.0 - 15.0 g/dL 8.4  8.0  7.2   Hematocrit 36.0 - 46.0 % 25.7  24.5  22.3   Platelets 150 - 400 K/uL 156  51  52    . CBC    Component Value Date/Time   WBC 21.4 (H) 09/14/2023 0821   WBC 26.7 (H) 09/10/2023 0550   RBC 2.76 (L) 09/14/2023 0821   HGB 8.4 (L) 09/14/2023 0821   HGB 11.7 07/20/2017 1315   HCT 25.7 (L) 09/14/2023 0821   HCT 35.2 07/20/2017 1315   PLT 156 09/14/2023 0821   PLT 266 07/20/2017 1315   MCV 93.1 09/14/2023 0821   MCV 93.2 07/20/2017 1315   MCH 30.4 09/14/2023 0821   MCHC 32.7 09/14/2023 0821   RDW 20.3 (H) 09/14/2023 0821   RDW 14.5 07/20/2017 1315   LYMPHSABS 0.3 (L) 09/14/2023 0821   LYMPHSABS 0.3 (L) 07/20/2017 1315   MONOABS 1.2 (H) 09/14/2023 0821   MONOABS 0.5 07/20/2017 1315   EOSABS 0.2 09/14/2023 0821   EOSABS 0.2 07/20/2017 1315   BASOSABS 0.1 09/14/2023 0821   BASOSABS 0.0 07/20/2017 1315   .    Latest Ref Rng & Units 09/14/2023    8:21 AM 09/10/2023    5:50 AM 09/09/2023    9:55 PM   CMP  Glucose 70 - 99 mg/dL 161  096  045   BUN 6 - 20 mg/dL 9  6  7    Creatinine 0.44 - 1.00 mg/dL 4.09  8.11  9.14   Sodium 135 - 145 mmol/L 139  134  136   Potassium 3.5 - 5.1 mmol/L 3.5  2.9  2.9   Chloride 98 - 111 mmol/L 101  95  98   CO2 22 - 32 mmol/L 31  27  27    Calcium 8.9 - 10.3 mg/dL 8.8  8.2  8.6   Total Protein 6.5 - 8.1 g/dL 6.1  6.6  6.7  Total Bilirubin 0.3 - 1.2 mg/dL 0.9  1.1  1.5   Alkaline Phos 38 - 126 U/L 169  151  141   AST 15 - 41 U/L 26  15  17    ALT 0 - 44 U/L 34  15  16      RADIOGRAPHIC STUDIES: I have personally reviewed the radiological images as listed and agreed with the findings in the report. DG Chest Port 1 View  Result Date: 09/09/2023 CLINICAL DATA:  Shortness of breath and cough EXAM: PORTABLE CHEST 1 VIEW COMPARISON:  09/08/2019 FINDINGS: Cardiac shadow remains enlarged. Left chest wall port is again seen and stable. Lungs again demonstrates some volume loss on the right stable from the prior exam. Central vascular congestion is seen stable from the prior exam. No new focal infiltrate is noted. No bony abnormality is seen. IMPRESSION: Central vascular congestion similar to that seen on prior plain film. Stable volume loss on the right is noted similar to that seen on the prior exam. Electronically Signed   By: Alcide Clever M.D.   On: 09/09/2023 23:35   [place] CHEST (PLEURAL EFFUSION)  Result Date: 09/08/2023 CLINICAL DATA:  37 year old female history of pleural effusion. EXAM: CHEST ULTRASOUND COMPARISON:  Chest CT from 09/08/2023 FINDINGS: Trace left pleural effusion. No evidence of significant right pleural effusion. IMPRESSION: No sufficient pleural fluid for thoracentesis. No thoracentesis was performed. Marliss Coots, MD [facility] Electronically Signed   By: Marliss Coots M.D.   On: 09/08/2023 16:28   ECHOCARDIOGRAM COMPLETE  Result Date: 09/08/2023    ECHOCARDIOGRAM REPORT   Patient  Name:   Heidi [name] Date of Exam: 09/08/2023 Medical Rec #:  [id number]          Height:       67.0 in Accession #:    [id number]         Weight:       143.8 lb Date of Birth:  [birth year]         BSA:          1.758 m Patient Age:    36 years           BP:           139/90 mmHg Patient Gender: F                  HR:           118 bpm. Exam Location:  Inpatient Procedure: 2D Echo, Cardiac Doppler and Color Doppler Indications:    Pericardial Effusion I31.3  History:        Patient has prior history of Echocardiogram examinations, most                 recent 07/04/2023.  Sonographer:    Harriette Bouillon RDCS Referring Phys: [id number] Elenor Quinones DAVIS IMPRESSIONS  1. Left ventricular ejection fraction, by estimation, is 65 to 70%. The left ventricle has normal function. The left ventricle has no regional wall motion abnormalities. Left ventricular diastolic parameters are indeterminate. There is the interventricular septum is flattened in systole, consistent with right ventricular pressure overload.  2. Right ventricular systolic function is moderately reduced. The right ventricular size is normal. Tricuspid regurgitation signal is inadequate for assessing PA pressure.  3. Small to moderate circumfrential pericardial effusion. The pericardial effusion is circumferential.  4. The mitral valve is normal in structure. Trivial mitral valve regurgitation. No evidence of mitral stenosis.  5. The aortic  valve is tricuspid. Aortic valve regurgitation is not visualized. No aortic stenosis is present.  6. The inferior vena cava is dilated in size with <50% respiratory variability, suggesting right atrial pressure of 15 mmHg. Conclusion(s)/Recommendation(s): Technically limited echo due to poor sound wave transmission. There is a small to moderate pericardial efussion with a dilated IVC. Doubt tamponade physiology but mitral inflow with respirometer not performed. Given pericardial effusion, RV dysfunction and IVC dialtion would  consider evalaution for pulmonary HTN. FINDINGS  Left Ventricle: Left ventricular ejection fraction, by estimation, is 65 to 70%. The left ventricle has normal function. The left ventricle has no regional wall motion abnormalities. The left ventricular internal cavity size was normal in size. There is  no left ventricular hypertrophy. The interventricular septum is flattened in systole, consistent with right ventricular pressure overload. Left ventricular diastolic parameters are indeterminate. Right Ventricle: The right ventricular size is normal. Right vetricular wall thickness was not well visualized. Right ventricular systolic function is moderately reduced. Tricuspid regurgitation signal is inadequate for assessing PA pressure. Left Atrium: Left atrial size was normal in size. Right Atrium: Right atrial size was normal in size. Pericardium: A moderately sized pericardial effusion is present. The pericardial effusion is circumferential. Mitral Valve: The mitral valve is normal in structure. Trivial mitral valve regurgitation. No evidence of mitral valve stenosis. Tricuspid Valve: The tricuspid valve is not well visualized. Tricuspid valve regurgitation is trivial. No evidence of tricuspid stenosis. Aortic Valve: The aortic valve is tricuspid. Aortic valve regurgitation is not visualized. No aortic stenosis is present. Pulmonic Valve: The pulmonic valve was normal in structure. Pulmonic valve regurgitation is not visualized. No evidence of pulmonic stenosis. Aorta: The aortic root is normal in size and structure. Venous: The inferior vena cava is dilated in size with less than 50% respiratory variability, suggesting right atrial pressure of 15 mmHg. IAS/Shunts: No atrial level shunt detected by color flow Doppler.  LEFT VENTRICLE PLAX 2D LVIDd:         4.40 cm   Diastology LVIDs:         3.60 cm   LV e' lateral: 8.92 cm/s LV PW:         0.90 cm LV IVS:        0.80 cm LVOT diam:     2.20 cm LV SV:         51 LV SV  Index:   29 LVOT Area:     3.80 cm  RIGHT VENTRICLE             IVC RV S prime:     11.40 cm/s  IVC diam: 2.30 cm TAPSE (M-mode): 0.9 cm LEFT ATRIUM           Index LA Vol (A4C): 34.2 ml 19.46 ml/m  AORTIC VALVE LVOT Vmax:   101.00 cm/s LVOT Vmean:  67.600 cm/s LVOT VTI:    0.135 m  AORTA Ao Root diam: 3.10 cm Ao Asc diam:  3.20 cm  SHUNTS Systemic VTI:  0.14 m Systemic Diam: 2.20 cm Arvilla Meres MD Electronically signed by Arvilla Meres MD Signature Date/Time: 09/08/2023/1:46:51 PM    Final    CT Angio Chest Pulmonary Embolism (PE) W or WO Contrast  Result Date: 09/08/2023 CLINICAL DATA:  Shortness of breath, hypoxia, tachycardia. Hodgkin lymphoma EXAM: CT ANGIOGRAPHY CHEST WITH CONTRAST TECHNIQUE: Multidetector CT imaging of the chest was performed using the standard protocol during bolus administration of intravenous contrast. Multiplanar CT image reconstructions and MIPs were obtained to evaluate  the vascular anatomy. RADIATION DOSE REDUCTION: This exam was performed according to the departmental dose-optimization program which includes automated exposure control, adjustment of the mA and/or kV according to patient size and/or use of iterative reconstruction technique. CONTRAST:  75mL OMNIPAQUE IOHEXOL 350 MG/ML SOLN COMPARISON:  PET-CT 06/15/2023 FINDINGS: Despite efforts by the technologist and patient, motion artifact is present on today's exam and could not be eliminated. This reduces exam sensitivity and specificity. Cardiovascular: Prominent main pulmonary artery at 3.6 cm diameter on image 153 of series 11 suggesting pulmonary arterial hypertension. No filling defect is identified in the pulmonary arterial tree to suggest pulmonary embolus. Increased pericardial effusion, previously mild and currently moderate to prominent. Moderate cardiomegaly. Mediastinum/Nodes: Low-grade edema/stranding in the mediastinum. No observed pathologic adenopathy. Lungs/Pleura: Right anterior pleural  calcification on image 57 series 5, unchanged from prior exams. Moderate left and small right pleural effusion. Emphysema. Patchy chronic airspace opacities especially in the right lower lobe and left upper lobe, mildly increased interstitial accentuation and patchy ground-glass opacities in a mosaic attenuation pattern potentially from pulmonary edema, atypical pneumonia, or hypersensitivity pneumonitis. Some of the chronic airspace opacities could be therapy related. Upper Abdomen: Unremarkable Musculoskeletal: Unremarkable Review of the MIP images confirms the above findings. IMPRESSION: 1. No filling defect is identified in the pulmonary arterial tree to suggest pulmonary embolus. 2. Increased pericardial effusion, previously mild and currently moderate to prominent. Correlate with clinical indicators such as Beck's triad in assessing for tamponade, echocardiography may be warranted. 3. Moderate left and small right pleural effusion. 4. Patchy chronic airspace opacities especially in the right lower lobe and left upper lobe, mildly increased interstitial accentuation and patchy ground-glass opacities in a mosaic attenuation pattern potentially from pulmonary edema, atypical pneumonia, or hypersensitivity pneumonitis. Some of the chronic airspace opacities could be therapy related. 5. Prominent main pulmonary artery at 3.6 cm diameter suggesting pulmonary arterial hypertension. Critical Value/emergent results were called by telephone at the time of interpretation on 09/08/2023 at 12:32 pm to provider [facility] DAVIS , who verbally acknowledged these results. Electronically Signed   By: Gaylyn Rong M.D.   On: 09/08/2023 12:35   DG Chest Port 1 View  Result Date: 09/08/2023 CLINICAL DATA:  Shortness of breath EXAM: PORTABLE CHEST 1 VIEW COMPARISON:  Chest radiograph 08/22/2023, same-day CTA chest FINDINGS: The left chest wall port is stable in position terminating in the region of the cavoatrial junction.  The heart is enlarged, unchanged. There is unchanged volume loss in the right hemithorax. There is vascular congestion and mild pulmonary interstitial edema. Additional airspace opacities in both lungs are also noted common better seen on the same-day CT chest. There is no pneumothorax There is no acute osseous abnormality. IMPRESSION: 1. Cardiomegaly with small bilateral pleural effusions and pulmonary interstitial edema. 2. Unchanged volume loss in the right hemithorax. Additional patchy airspace opacities in both lungs better seen on the same-day CT chest. Electronically Signed   By: Lesia Hausen M.D.   On: 09/08/2023 11:44    ASSESSMENT & PLAN:   37 y.o. African-American female with  #1 Refractory/Progressive Mixed cellularity Hodgkin's lymphoma IV BE with extensive lymphadenopathy including right axillary, mediastinal and upper retroperitoneal and now biopsy proven pulmonary involvement. She was noted to have significant constitutional symptoms including significant weight loss, fevers chills and some night sweats. HIV negative Hepatitis C and hepatitis B serologies negative. Echo with normal ejection fraction. Patient was treated with 5 cycles of AVD (Bleomycin held due to poor DLCO 37% and ongoing smoking).  Multiple avoidable treatment delays due to the patient's noncompliance with follow-up for avoidable reasons. She has been counseled repeatedly that this would increase the likelihood of unfavorable outcome.   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 S/p 6 cycles of 2nd line treatment with Bendamustine/Brentuximab And 1 cycle of Bretuximab alone   -PET/CT scan results from 04/14/2017 were discussed in details. She appears to have some persistent disease in her right lung at Deauville 5. It is difficult to say if this is recurrent or persistent disease since the patient failed to follow-up on multiple scheduled PET/CT scans in the early part and prior to her  second line treatment.   Patient was lost to followup for >1 yr   09/26/18 PET/CT revealed Imaging findings compatible with recurrence of disease. 2. Multiple new large areas of hypermetabolic nodularity and airspace consolidation within both lungs which is presumed to represent pulmonary involvement by lymphoma. Deauville criteria 5. 3. New hypermetabolic left supraclavicular, left retroperitoneal, and bilateral pelvic lymph nodes. Deauville criteria 5. 4. Multifocal hypermetabolic osseous lesions.  Deauville criteria 5. 5. Small volume of ascites, new.   12/06/18 PET/CT revealed Generally improved appearance with previously mostly Deauville 5 activity now mostly Deauville 4 activity. 2. Layering of much of the airspace opacity in the lungs, although considerable right perihilar airspace opacity remains. The remaining pulmonary opacities are assess this Deauville 5 on the right and over L4 on the left, and were previously Deauville 5 bilaterally. 3. Mildly reduced size and moderately reduced activity in the abdominopelvic lymph nodes which are now dove L4. 4. The previously seen left supraclavicular lymph node seems to have completely resolved (Deauville 0). 5. The skeletal lesions remain at Deauville 4, although in absolute terms have decreased in SUV compared to previous. 6. No new regions of malignant involvement compared to prior exam. 7. Other imaging findings of potential clinical significance: Low-density blood pool suggests anemia. Pectus excavatum. Volume loss in the right hemithorax.  06/13/19 PET/CT revealed "No abnormal hypermetabolism (Deauville 1). 2. Mixed lytic and sclerotic osseous lesions appear more prominent than on 12/06/2018 but do not have associated hypermetabolism, indicative of interval healing." 01/31/2020 CT ABDOMEN PELVIS W CONTRAST (Accession [id number]) and CT CHEST W CONTRAST (Accession [id number]) revealed "1. Small pre pericardial lymph node, not present on the prior study.  2. Stable volume loss in the right chest with shift of mediastinal structures into the right chest. 3. No change in the appearance of multifocal bony sclerosis, without signs of FDG uptake on the previous exam, attention on follow-up."   #2 s/p hypoxic respiratory failure with dense right lung consolidation and left upper lobe consolidation with SVC syndrome. Patient has completed palliative radiation to the right lung mass causing SVC compression.  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   #4 Non compliance with clinic and treatment followup. Missed 2nd dose of bendamustine with C2. Has missed multiple appointment for her PET/CT and missed appointment at [facility] for consideration of Transplant.  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 CBC shows the patient is leukopenic with WBC at 2.1 K, decreased hemoglobin at 9.4 g/dL, and decreased hematocrit at 27.1%. CMP is stable. -Provided more information about transplant.  -Patient is going to think about transplant option more, talk to her family and friends, and make a decision later.  -Recommend to follow-up with transplant team regarding additional option than the transplant.  -Patient tolerated her first cycle of her treatment well without any new or severe toxicities.  -Patient can proceed with cycle 2 day 8 of her treatment without doxorubicin and without any dose  modification.   FOLLOW-UP: ***  The total time spent in the appointment was *** minutes* .  All of the patient's questions were answered with apparent satisfaction. The patient knows to call the clinic with any problems, questions or concerns.   Wyvonnia Lora MD MS AAHIVMS [facility] Hematology/Oncology Physician [facility]  .*Total Encounter Time as defined by the Centers for Medicare and Medicaid Services includes, in addition to the face-to-face time of a patient visit (documented in the note above) non-face-to-face time: obtaining and reviewing outside history, ordering and reviewing medications, tests or procedures, care coordination (communications with other health care professionals or caregivers) and documentation in the medical record.    I,Mitra Faeizi,acting as a Neurosurgeon for Wyvonnia Lora, MD.,have documented all relevant documentation on the behalf of Wyvonnia Lora, MD,as directed by  Wyvonnia Lora, MD while in the presence of Wyvonnia Lora, MD.  ***

## 2023-09-30 ENCOUNTER — Other Ambulatory Visit: Payer: Self-pay

## 2023-10-02 ENCOUNTER — Other Ambulatory Visit: Payer: Self-pay

## 2023-10-07 ENCOUNTER — Inpatient Hospital Stay: Payer: Medicaid Other | Attending: Hematology

## 2023-10-07 ENCOUNTER — Inpatient Hospital Stay: Payer: Medicaid Other | Admitting: Hematology

## 2023-10-07 DIAGNOSIS — I3139 Other pericardial effusion (noninflammatory): Secondary | ICD-10-CM | POA: Insufficient documentation

## 2023-10-07 DIAGNOSIS — Z91199 Patient's noncompliance with other medical treatment and regimen due to unspecified reason: Secondary | ICD-10-CM | POA: Insufficient documentation

## 2023-10-07 DIAGNOSIS — F17210 Nicotine dependence, cigarettes, uncomplicated: Secondary | ICD-10-CM | POA: Insufficient documentation

## 2023-10-07 DIAGNOSIS — Z79899 Other long term (current) drug therapy: Secondary | ICD-10-CM | POA: Insufficient documentation

## 2023-10-07 DIAGNOSIS — R0602 Shortness of breath: Secondary | ICD-10-CM | POA: Insufficient documentation

## 2023-10-07 DIAGNOSIS — B029 Zoster without complications: Secondary | ICD-10-CM | POA: Insufficient documentation

## 2023-10-07 DIAGNOSIS — R6883 Chills (without fever): Secondary | ICD-10-CM | POA: Insufficient documentation

## 2023-10-07 DIAGNOSIS — R188 Other ascites: Secondary | ICD-10-CM | POA: Insufficient documentation

## 2023-10-07 DIAGNOSIS — C8128 Mixed cellularity classical Hodgkin lymphoma, lymph nodes of multiple sites: Secondary | ICD-10-CM | POA: Insufficient documentation

## 2023-10-07 DIAGNOSIS — Q676 Pectus excavatum: Secondary | ICD-10-CM | POA: Insufficient documentation

## 2023-10-07 DIAGNOSIS — R61 Generalized hyperhidrosis: Secondary | ICD-10-CM | POA: Insufficient documentation

## 2023-10-07 DIAGNOSIS — R634 Abnormal weight loss: Secondary | ICD-10-CM | POA: Insufficient documentation

## 2023-10-13 ENCOUNTER — Inpatient Hospital Stay: Payer: Medicaid Other

## 2023-10-13 ENCOUNTER — Inpatient Hospital Stay (HOSPITAL_BASED_OUTPATIENT_CLINIC_OR_DEPARTMENT_OTHER): Payer: Medicaid Other | Admitting: Hematology

## 2023-10-13 VITALS — BP 146/106 | HR 80 | Temp 97.9°F | Resp 20 | Wt 140.4 lb

## 2023-10-13 DIAGNOSIS — Q676 Pectus excavatum: Secondary | ICD-10-CM | POA: Diagnosis not present

## 2023-10-13 DIAGNOSIS — D649 Anemia, unspecified: Secondary | ICD-10-CM

## 2023-10-13 DIAGNOSIS — Z79899 Other long term (current) drug therapy: Secondary | ICD-10-CM | POA: Diagnosis not present

## 2023-10-13 DIAGNOSIS — B029 Zoster without complications: Secondary | ICD-10-CM | POA: Diagnosis not present

## 2023-10-13 DIAGNOSIS — C8128 Mixed cellularity classical Hodgkin lymphoma, lymph nodes of multiple sites: Secondary | ICD-10-CM

## 2023-10-13 DIAGNOSIS — I3139 Other pericardial effusion (noninflammatory): Secondary | ICD-10-CM | POA: Diagnosis not present

## 2023-10-13 DIAGNOSIS — R0602 Shortness of breath: Secondary | ICD-10-CM | POA: Diagnosis not present

## 2023-10-13 DIAGNOSIS — R6883 Chills (without fever): Secondary | ICD-10-CM | POA: Diagnosis not present

## 2023-10-13 DIAGNOSIS — F17210 Nicotine dependence, cigarettes, uncomplicated: Secondary | ICD-10-CM | POA: Diagnosis not present

## 2023-10-13 DIAGNOSIS — Z95828 Presence of other vascular implants and grafts: Secondary | ICD-10-CM

## 2023-10-13 DIAGNOSIS — Z7189 Other specified counseling: Secondary | ICD-10-CM | POA: Diagnosis not present

## 2023-10-13 DIAGNOSIS — Z91199 Patient's noncompliance with other medical treatment and regimen due to unspecified reason: Secondary | ICD-10-CM | POA: Diagnosis not present

## 2023-10-13 DIAGNOSIS — R61 Generalized hyperhidrosis: Secondary | ICD-10-CM | POA: Diagnosis not present

## 2023-10-13 DIAGNOSIS — R188 Other ascites: Secondary | ICD-10-CM | POA: Diagnosis not present

## 2023-10-13 DIAGNOSIS — R634 Abnormal weight loss: Secondary | ICD-10-CM | POA: Diagnosis not present

## 2023-10-13 LAB — CBC WITH DIFFERENTIAL (CANCER CENTER ONLY)
Abs Immature Granulocytes: 0.01 10*3/uL (ref 0.00–0.07)
Basophils Absolute: 0 10*3/uL (ref 0.0–0.1)
Basophils Relative: 1 %
Eosinophils Absolute: 0.4 10*3/uL (ref 0.0–0.5)
Eosinophils Relative: 9 %
HCT: 33.8 % — ABNORMAL LOW (ref 36.0–46.0)
Hemoglobin: 10.7 g/dL — ABNORMAL LOW (ref 12.0–15.0)
Immature Granulocytes: 0 %
Lymphocytes Relative: 6 %
Lymphs Abs: 0.3 10*3/uL — ABNORMAL LOW (ref 0.7–4.0)
MCH: 30.9 pg (ref 26.0–34.0)
MCHC: 31.7 g/dL (ref 30.0–36.0)
MCV: 97.7 fL (ref 80.0–100.0)
Monocytes Absolute: 0.6 10*3/uL (ref 0.1–1.0)
Monocytes Relative: 14 %
Neutro Abs: 2.8 10*3/uL (ref 1.7–7.7)
Neutrophils Relative %: 70 %
Platelet Count: 230 10*3/uL (ref 150–400)
RBC: 3.46 MIL/uL — ABNORMAL LOW (ref 3.87–5.11)
RDW: 17 % — ABNORMAL HIGH (ref 11.5–15.5)
WBC Count: 4 10*3/uL (ref 4.0–10.5)
nRBC: 0 % (ref 0.0–0.2)

## 2023-10-13 LAB — CMP (CANCER CENTER ONLY)
ALT: 11 U/L (ref 0–44)
AST: 18 U/L (ref 15–41)
Albumin: 4 g/dL (ref 3.5–5.0)
Alkaline Phosphatase: 96 U/L (ref 38–126)
Anion gap: 6 (ref 5–15)
BUN: 8 mg/dL (ref 6–20)
CO2: 30 mmol/L (ref 22–32)
Calcium: 8.9 mg/dL (ref 8.9–10.3)
Chloride: 103 mmol/L (ref 98–111)
Creatinine: 0.75 mg/dL (ref 0.44–1.00)
GFR, Estimated: >60 mL/min (ref >60)
Glucose, Bld: 78 mg/dL (ref 70–99)
Potassium: 4.1 mmol/L (ref 3.5–5.1)
Sodium: 139 mmol/L (ref 135–145)
Total Bilirubin: 0.6 mg/dL (ref 0.3–1.2)
Total Protein: 6.5 g/dL (ref 6.5–8.1)

## 2023-10-13 MED ORDER — HEPARIN SOD (PORK) LOCK FLUSH 100 UNIT/ML IV SOLN
500.0000 [IU] | Freq: Once | INTRAVENOUS | Status: AC
  Administered 2023-10-13: 500 [IU]

## 2023-10-13 MED ORDER — SODIUM CHLORIDE 0.9% FLUSH
10.0000 mL | Freq: Once | INTRAVENOUS | Status: AC
  Administered 2023-10-13: 10 mL

## 2023-10-13 NOTE — Progress Notes (Signed)
HEMATOLOGY/ONCOLOGY CLINIC VISIT NOTE  Date of service:  10/13/23    Patient Care Team: Pcp, No as PCP - General Heidi Maine, MD as Consulting Physician (Hematology)  Chief complaint: Follow-up for Hodgkin's lymphoma  Diagnosis:   Refractory Mixed cellularity Hodgkin's lymphoma IVBE with extensive lymphadenopathy including right axillary, mediastinal and upper retroperitoneal and now biopsy proven pulmonary involvement. She was noted to have significant constitutional symptoms including significant weight loss, fevers chills and some night sweats.  Current Treatment:  Lost to f/u for about 2 yrs  Previous treatment  5 cycles of AVD (without bleomycin due to lung involvement and DLCO of 37%, active smoker) Multiple avoidable treatment delays due to the patient's noncompliance with follow-up for avoidable reasons. She has been counseled repeatedly that this would increase the likelihood of unfavorable outcome.  2nd line therapy with Bendamustine + Brentuximab s/p 7 cycles.  3rd line therapy with ICE s/p 2 cycles   Pembrolizumab- 4th line. Patient declined HDT/Auto HSCT or consideration of Car-T cell therapy.  INTERVAL HISTORY:  Heidi [name] is a 37 y.o. female here for follow-up for her Hodgkins lymphoma.   Patient was last seen by PA Walisiewicz on 09/14/2023 for hospital follow-up after signing out the week prior AMA. She reported intermittent SOB and nonproductive cough during her visit with PA Walisiewicz.   Today, she reports that her breathing has improved and she has been able to move around more with normal energy levels. Patient reports that she does not use a nebulizer or inhaler at home. Patient denies any lightheadedness, diarrhea, SOB, fever, chills, night sweats, dizziness, leg swelling, abdominal pain, or lump/bumps.   REVIEW OF SYSTEMS:   10 Point review of Systems was done is negative except as noted above.   Past Medical History:  Diagnosis Date    ARDS (adult respiratory distress syndrome) (HCC)    Asthma    Hodgkin lymphoma (HCC)    Hypertension     . Past Surgical History:  Procedure Laterality Date   AXILLARY LYMPH NODE BIOPSY Right 03/19/2016   Procedure: AXILLARY LYMPH NODE BIOPSY;  Surgeon: Darnell Level, MD;  Location: [facility];  Service: General;  Laterality: Right;   IR GENERIC HISTORICAL  12/22/2016   IR FLUORO GUIDE PORT INSERTION LEFT 12/22/2016 [facility]   IR GENERIC HISTORICAL  12/22/2016   IR US GUIDE VASC ACCESS LEFT 12/22/2016 [facility]   IR GENERIC HISTORICAL  12/22/2016   IR CV LINE INJECTION 12/22/2016 [facility]   IR GENERIC HISTORICAL  12/22/2016   IR REMOVAL TUN ACCESS W/ PORT W/O FL MOD SED 12/22/2016 [facility]   VIDEO BRONCHOSCOPY Bilateral 11/26/2016   Procedure: VIDEO BRONCHOSCOPY WITH FLUORO;  Surgeon: Oretha Milch, MD;  Location: [facility];  Service: Cardiopulmonary;  Laterality: Bilateral;    . Social History   Tobacco Use   Smoking status: Some Days    Current packs/day: 0.00    Average packs/day: 0.5 packs/day for 15.0 years (7.5 ttl pk-yrs)    Types: Cigarettes    Start date: 03/27/2001    Last attempt to quit: 03/27/2016    Years since quitting: 7.5   Smokeless tobacco: Never  Vaping Use   Vaping status: Never Used  Substance Use Topics   Alcohol use: Yes    Comment: occasional now   Drug use: Not Currently    ALLERGIES:  is allergic to doxorubicin hcl liposomal.  MEDICATIONS:  Current Outpatient Medications  Medication Sig Dispense Refill   TYLENOL 500 MG  tablet Take 500 mg by mouth every 6 (six) hours as needed for mild pain or headache.     No current facility-administered medications for this visit.   Facility-Administered Medications Ordered in Other Visits  Medication Dose Route Frequency Provider Last Rate Last Admin   sodium chloride flush (NS) 0.9 % injection 10 mL  10 mL Intracatheter PRN Candise Che, Corene Cornea, MD        PHYSICAL EXAMINATION:  .BP  (!) 146/106 Comment: RN Waynetta Sandy W aware  Pulse 80   Temp 97.9 F (36.6 C)   Resp 20   Wt 140 lb 6.4 oz (63.7 kg)   SpO2 99%   BMI 21.99 kg/m   GENERAL:alert, in no acute distress and comfortable SKIN: no acute rashes, no significant lesions EYES: conjunctiva are pink and non-injected, sclera anicteric OROPHARYNX: MMM, no exudates, no oropharyngeal erythema or ulceration NECK: supple, no JVD LYMPH:  no palpable lymphadenopathy in the cervical, axillary or inguinal regions LUNGS: clear to auscultation b/l with normal respiratory effort HEART: regular rate & rhythm ABDOMEN:  normoactive bowel sounds , non tender, not distended. Extremity: no pedal edema PSYCH: alert & oriented x 3 with fluent speech NEURO: no focal motor/sensory deficits   ECOG FS:1 - Symptomatic but completely ambulatory  Vitals:   10/13/23 0940  BP: (!) 146/106  Pulse: 80  Resp: 20  Temp: 97.9 F (36.6 C)  SpO2: 99%    Wt Readings from Last 3 Encounters:  10/13/23 140 lb 6.4 oz (63.7 kg)  09/14/23 141 lb 8 oz (64.2 kg)  09/14/23 141 lb 8 oz (64.2 kg)   Body mass index is 21.99 kg/m.    LABORATORY DATA:   I have reviewed the data as listed     Latest Ref Rng & Units 10/13/2023    9:22 AM 09/14/2023    8:21 AM 09/10/2023    5:50 AM  CBC  WBC 4.0 - 10.5 K/uL 4.0  21.4  26.7   Hemoglobin 12.0 - 15.0 g/dL 56.2  8.4  8.0   Hematocrit 36.0 - 46.0 % 33.8  25.7  24.5   Platelets 150 - 400 K/uL 230  156  51    . CBC    Component Value Date/Time   WBC 4.0 10/13/2023 0922   WBC 26.7 (H) 09/10/2023 0550   RBC 3.46 (L) 10/13/2023 0922   HGB 10.7 (L) 10/13/2023 0922   HGB 11.7 07/20/2017 1315   HCT 33.8 (L) 10/13/2023 0922   HCT 35.2 07/20/2017 1315   PLT 230 10/13/2023 0922   PLT 266 07/20/2017 1315   MCV 97.7 10/13/2023 0922   MCV 93.2 07/20/2017 1315   MCH 30.9 10/13/2023 0922   MCHC 31.7 10/13/2023 0922   RDW 17.0 (H) 10/13/2023 0922   RDW 14.5 07/20/2017 1315   LYMPHSABS 0.3 (L) 10/13/2023  0922   LYMPHSABS 0.3 (L) 07/20/2017 1315   MONOABS 0.6 10/13/2023 0922   MONOABS 0.5 07/20/2017 1315   EOSABS 0.4 10/13/2023 0922   EOSABS 0.2 07/20/2017 1315   BASOSABS 0.0 10/13/2023 0922   BASOSABS 0.0 07/20/2017 1315   .    Latest Ref Rng & Units 10/13/2023    9:22 AM 09/14/2023    8:21 AM 09/10/2023    5:50 AM  CMP  Glucose 70 - 99 mg/dL 78  130  865   BUN 6 - 20 mg/dL 8  9  6    Creatinine 0.44 - 1.00 mg/dL 7.84  6.96  2.95   Sodium 135 -  145 mmol/L 139  139  134   Potassium 3.5 - 5.1 mmol/L 4.1  3.5  2.9   Chloride 98 - 111 mmol/L 103  101  95   CO2 22 - 32 mmol/L 30  31  27    Calcium 8.9 - 10.3 mg/dL 8.9  8.8  8.2   Total Protein 6.5 - 8.1 g/dL 6.5  6.1  6.6   Total Bilirubin 0.3 - 1.2 mg/dL 0.6  0.9  1.1   Alkaline Phos 38 - 126 U/L 96  169  151   AST 15 - 41 U/L 18  26  15    ALT 0 - 44 U/L 11  34  15      RADIOGRAPHIC STUDIES: I have personally reviewed the radiological images as listed and agreed with the findings in the report. NM PET Image Restag (PS) Skull Base To Thigh  Result Date: 10/05/2023 CLINICAL DATA:  Subsequent treatment strategy for Hodgkin's lymphoma. EXAM: NUCLEAR MEDICINE PET SKULL BASE TO THIGH TECHNIQUE: 6.91 mCi F-18 FDG was injected intravenously. Full-ring PET imaging was performed from the skull base to thigh after the radiotracer. CT data was obtained and used for attenuation correction and anatomic localization. Fasting blood glucose: 112 mg/dl COMPARISON:  Chest CTA 09/08/2023.  PET-CT 06/15/2023. FINDINGS: Mediastinal blood pool activity: SUV max 1.9 Liver activity: SUV max 2.9 NECK: No hypermetabolic cervical lymph nodes are identified. No suspicious activity identified within the pharyngeal mucosal space. Prominent diffuse parotid activity bilaterally has mildly increased from the previous study, likely treatment related. Incidental CT findings: none CHEST: There are no hypermetabolic mediastinal, hilar or axillary lymph nodes. No residual or  recurrent hypermetabolic pulmonary activity or suspicious nodularity. Specifically, the hypermetabolic opacity previously demonstrated in the lingula has decreased in size, measuring 1.8 x 1.0 cm on image 46/7 (previously 2.6 x 1.7 cm) and has an SUV max of 1.5 (previously 6.0). Diffuse chronic bilateral airspace opacities with volume loss and air bronchograms are otherwise unchanged. Incidental CT findings: Left IJ Port-A-Cath extends to the lower SVC level. As seen on interval CTA, and chronic pericardial effusion has mildly enlarged compared with the prior PET-CT. Mild chronic central enlargement of the pulmonary arteries. ABDOMEN/PELVIS: There is no hypermetabolic activity within the liver, adrenal glands, spleen or pancreas. The previously demonstrated hypermetabolic lesion superiorly in the right hepatic lobe has resolved. No residual hypermetabolic or enlarged lymph nodes are seen within the abdomen or pelvis. Bowel activity within physiologic limits. Incidental CT findings: Surgical clips anteriorly in the upper abdomen. Moderate amount of pelvic ascites, similar to previous study, without hypermetabolic activity. SKELETON: There is no hypermetabolic activity to suggest viable osseous metastatic disease. Grossly stable heterogeneous sclerosis throughout the bones consistent with treated metastatic disease. Incidental CT findings: none IMPRESSION: 1. Interval resolution of previously demonstrated small hypermetabolic abdominal lymph nodes, a lesion in the right hepatic lobe and an opacity within the lingula, consistent with response to therapy. 2. No evidence of residual hypermetabolic or progressive disease. Deauville 2. 3. Chronic bilateral pulmonary opacities with volume loss and air bronchograms, similar to previous study. 4. Chronic pericardial effusion and pelvic ascites, similar to previous exams. 5. Grossly stable heterogeneous sclerosis throughout the bones consistent with treated metastatic disease.  Electronically Signed   By: Carey Bullocks M.D.   On: 10/05/2023 10:24    ASSESSMENT & PLAN:   37 y.o. African-American female with  #1 Refractory/Progressive Mixed cellularity Hodgkin's lymphoma IV BE with extensive lymphadenopathy including right axillary, mediastinal and upper retroperitoneal and  now biopsy proven pulmonary involvement. She was noted to have significant constitutional symptoms including significant weight loss, fevers chills and some night sweats. HIV negative Hepatitis C and hepatitis B serologies negative. Echo with normal ejection fraction. Patient was treated with 5 cycles of AVD (Bleomycin held due to poor DLCO 37% and ongoing smoking). Multiple avoidable treatment delays due to the patient's noncompliance with follow-up for avoidable reasons. She has been counseled repeatedly that this would increase the likelihood of unfavorable outcome.   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 S/p 6 cycles of 2nd line treatment with Bendamustine/Brentuximab And 1 cycle of Bretuximab alone   -PET/CT scan results from 04/14/2017 were discussed in details. She appears to have some persistent disease in her right lung at Deauville 5. It is difficult to say if this is recurrent or persistent disease since the patient failed to follow-up on multiple scheduled PET/CT scans in the early part and prior to her second line treatment.   Patient was lost to followup for >1 yr   09/26/18 PET/CT revealed Imaging findings compatible with recurrence of disease. 2. Multiple new large areas of hypermetabolic nodularity and airspace consolidation within both lungs which is presumed to represent pulmonary involvement by lymphoma. Deauville criteria 5. 3. New hypermetabolic left supraclavicular, left retroperitoneal, and bilateral pelvic lymph nodes. Deauville criteria 5. 4. Multifocal hypermetabolic osseous lesions.  Deauville criteria 5. 5. Small volume of ascites, new.    12/06/18 PET/CT revealed Generally improved appearance with previously mostly Deauville 5 activity now mostly Deauville 4 activity. 2. Layering of much of the airspace opacity in the lungs, although considerable right perihilar airspace opacity remains. The remaining pulmonary opacities are assess this Deauville 5 on the right and over L4 on the left, and were previously Deauville 5 bilaterally. 3. Mildly reduced size and moderately reduced activity in the abdominopelvic lymph nodes which are now dove L4. 4. The previously seen left supraclavicular lymph node seems to have completely resolved (Deauville 0). 5. The skeletal lesions remain at Deauville 4, although in absolute terms have decreased in SUV compared to previous. 6. No new regions of malignant involvement compared to prior exam. 7. Other imaging findings of potential clinical significance: Low-density blood pool suggests anemia. Pectus excavatum. Volume loss in the right hemithorax.  06/13/19 PET/CT revealed "No abnormal hypermetabolism (Deauville 1). 2. Mixed lytic and sclerotic osseous lesions appear more prominent than on 12/06/2018 but do not have associated hypermetabolism, indicative of interval healing." 01/31/2020 CT ABDOMEN PELVIS W CONTRAST (Accession [id number]) and CT CHEST W CONTRAST (Accession [id number]) revealed "1. Small pre pericardial lymph node, not present on the prior study. 2. Stable volume loss in the right chest with shift of mediastinal structures into the right chest. 3. No change in the appearance of multifocal bony sclerosis, without signs of FDG uptake on the previous exam, attention on follow-up."   #2 s/p hypoxic respiratory failure with dense right lung consolidation and left upper lobe consolidation with SVC syndrome. Patient has completed palliative radiation to the right lung mass causing SVC compression.  11/22/18 PFT which revealed some improvement in DLCO, however some element of restriction and obstruction  is noted    #3 previous h/o SVC syndrome - right facial and right upper extremity swelling -resolved.   #4 Non compliance with clinic and treatment followup. Missed 2nd dose of bendamustine with C2. Has missed multiple appointment for her PET/CT and missed appointment at [facility] for consideration of Transplant.  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 CBC showed WBC of 4.0K, hemoglobin of 10.7, and platelets of 230K. -Discussed results of PET scan from 09/22/2023 which showed that all of her previous lymph nodes have resolved and there are no signs of new lymphoma.4. There were findings of chronic pericardial effusion.  -Discussed that findings of chronic changes in the lung may be from previous hodgkin's involvement in the lung, smoking history, or radiation therapy causing scaring.  -will refer patient to cardiologist to manage chronic pericardial effusion  -discussed results from 09/08/2023 echocardiogram -discussed potential proceeding options including: Bone marrow transplant CAR T cell therapies programmed to fight tumor cells Continue immunotherapy -discussed that with stable lungs and heart, a bone marrow transplant would likely be her best chance for cure. If patient does not choose to proceed with a bone marrow transplant and she does progress again, I am unsure if a bone marrow transplant will still be an option down the line.  -patient would like to follow up with the transplant team to discuss proceeding options including considerations of clinical trials, bone marrow transplant, or CAR T cell  therapy -recommend patient to connect with transplant team to discuss whether there are options for Hodgkin-related CAR T cell therapies -discussed that there may be a role an inpatient transplant if needed and this would be an important discussion to have with the transplant team -recommend patient to generally stay UTD with her age-appropriate vaccines including flu shot, COVID-19, and pneumonia. Discussed that there may be a role to receive vaccines at a later time if there are considerations of a bone marrow transplant and she shall discuss this with the transplant team.  FOLLOW-UP: Follow-up with Dr. Alphonsa Gin at [facility] for transplant consideration Return to clinic with Dr. Candise Che with port flush and labs in 6 weeks  The total time spent in the appointment was 40 minutes* .  All of the patient's questions were answered with apparent satisfaction. The patient knows to call the clinic with any problems, questions or concerns.   Wyvonnia Lora MD MS AAHIVMS [facility] Hematology/Oncology Physician [facility]  .*Total Encounter Time as defined by the Centers for Medicare and Medicaid Services includes, in addition to the face-to-face time of a patient visit (documented in the note above) non-face-to-face time: obtaining and reviewing outside history, ordering and reviewing medications, tests or procedures, care coordination (communications with other health care professionals or caregivers) and documentation in the medical record.   I,Mitra Faeizi,acting as a Neurosurgeon for Wyvonnia Lora, MD.,have documented all relevant documentation on the behalf of Wyvonnia Lora, MD,as directed by  Wyvonnia Lora, MD while in the presence of Wyvonnia Lora, MD.  .I have reviewed the above documentation for accuracy and completeness, and I agree with the above. Heidi Maine MD

## 2023-10-19 ENCOUNTER — Encounter: Payer: Self-pay | Admitting: Hematology

## 2023-10-22 ENCOUNTER — Other Ambulatory Visit: Payer: Self-pay

## 2023-11-16 ENCOUNTER — Encounter: Payer: Self-pay | Admitting: Hematology

## 2023-11-16 NOTE — Telephone Encounter (Signed)
Telephone call  

## 2023-11-30 ENCOUNTER — Other Ambulatory Visit: Payer: Self-pay

## 2023-11-30 DIAGNOSIS — C8128 Mixed cellularity classical Hodgkin lymphoma, lymph nodes of multiple sites: Secondary | ICD-10-CM

## 2023-11-30 DIAGNOSIS — C8198 Hodgkin lymphoma, unspecified, lymph nodes of multiple sites: Secondary | ICD-10-CM

## 2023-12-02 ENCOUNTER — Inpatient Hospital Stay: Payer: Medicaid Other | Admitting: Hematology

## 2023-12-02 ENCOUNTER — Inpatient Hospital Stay: Payer: Medicaid Other

## 2023-12-02 NOTE — Progress Notes (Signed)
Contacted pt because she did not come in for her appointment today. Pt stated she was unaware of the appointment. New appointment made. Pt aware of date and time.

## 2023-12-03 ENCOUNTER — Other Ambulatory Visit: Payer: Self-pay

## 2024-01-01 ENCOUNTER — Other Ambulatory Visit: Payer: Self-pay

## 2024-01-02 ENCOUNTER — Inpatient Hospital Stay: Payer: Medicaid Other | Attending: Hematology

## 2024-01-02 ENCOUNTER — Inpatient Hospital Stay (HOSPITAL_BASED_OUTPATIENT_CLINIC_OR_DEPARTMENT_OTHER): Payer: Medicaid Other | Admitting: Hematology

## 2024-01-02 VITALS — BP 151/90 | HR 100 | Temp 97.3°F | Resp 16 | Wt 145.4 lb

## 2024-01-02 DIAGNOSIS — F17210 Nicotine dependence, cigarettes, uncomplicated: Secondary | ICD-10-CM | POA: Insufficient documentation

## 2024-01-02 DIAGNOSIS — B029 Zoster without complications: Secondary | ICD-10-CM | POA: Insufficient documentation

## 2024-01-02 DIAGNOSIS — Z79899 Other long term (current) drug therapy: Secondary | ICD-10-CM | POA: Diagnosis not present

## 2024-01-02 DIAGNOSIS — R188 Other ascites: Secondary | ICD-10-CM | POA: Insufficient documentation

## 2024-01-02 DIAGNOSIS — R6883 Chills (without fever): Secondary | ICD-10-CM | POA: Diagnosis not present

## 2024-01-02 DIAGNOSIS — Q676 Pectus excavatum: Secondary | ICD-10-CM | POA: Diagnosis not present

## 2024-01-02 DIAGNOSIS — C8198 Hodgkin lymphoma, unspecified, lymph nodes of multiple sites: Secondary | ICD-10-CM | POA: Diagnosis not present

## 2024-01-02 DIAGNOSIS — R591 Generalized enlarged lymph nodes: Secondary | ICD-10-CM | POA: Insufficient documentation

## 2024-01-02 DIAGNOSIS — Z95828 Presence of other vascular implants and grafts: Secondary | ICD-10-CM

## 2024-01-02 DIAGNOSIS — C8128 Mixed cellularity classical Hodgkin lymphoma, lymph nodes of multiple sites: Secondary | ICD-10-CM | POA: Insufficient documentation

## 2024-01-02 DIAGNOSIS — R634 Abnormal weight loss: Secondary | ICD-10-CM | POA: Insufficient documentation

## 2024-01-02 DIAGNOSIS — R61 Generalized hyperhidrosis: Secondary | ICD-10-CM | POA: Diagnosis not present

## 2024-01-02 DIAGNOSIS — Z7189 Other specified counseling: Secondary | ICD-10-CM

## 2024-01-02 LAB — CMP (CANCER CENTER ONLY)
ALT: 15 U/L (ref 0–44)
AST: 34 U/L (ref 15–41)
Albumin: 4.6 g/dL (ref 3.5–5.0)
Alkaline Phosphatase: 113 U/L (ref 38–126)
Anion gap: 13 (ref 5–15)
BUN: 10 mg/dL (ref 6–20)
CO2: 23 mmol/L (ref 22–32)
Calcium: 8.9 mg/dL (ref 8.9–10.3)
Chloride: 97 mmol/L — ABNORMAL LOW (ref 98–111)
Creatinine: 0.75 mg/dL (ref 0.44–1.00)
GFR, Estimated: >60 mL/min (ref >60)
Glucose, Bld: 75 mg/dL (ref 70–99)
Potassium: 4.1 mmol/L (ref 3.5–5.1)
Sodium: 133 mmol/L — ABNORMAL LOW (ref 135–145)
Total Bilirubin: 0.4 mg/dL (ref 0.0–1.2)
Total Protein: 7.6 g/dL (ref 6.5–8.1)

## 2024-01-02 LAB — CBC WITH DIFFERENTIAL (CANCER CENTER ONLY)
Abs Immature Granulocytes: 0.02 10*3/uL (ref 0.00–0.07)
Basophils Absolute: 0 10*3/uL (ref 0.0–0.1)
Basophils Relative: 0 %
Eosinophils Absolute: 0.1 10*3/uL (ref 0.0–0.5)
Eosinophils Relative: 1 %
HCT: 35.6 % — ABNORMAL LOW (ref 36.0–46.0)
Hemoglobin: 12.4 g/dL (ref 12.0–15.0)
Immature Granulocytes: 0 %
Lymphocytes Relative: 8 %
Lymphs Abs: 0.6 10*3/uL — ABNORMAL LOW (ref 0.7–4.0)
MCH: 32.4 pg (ref 26.0–34.0)
MCHC: 34.8 g/dL (ref 30.0–36.0)
MCV: 93 fL (ref 80.0–100.0)
Monocytes Absolute: 0.6 10*3/uL (ref 0.1–1.0)
Monocytes Relative: 8 %
Neutro Abs: 6.1 10*3/uL (ref 1.7–7.7)
Neutrophils Relative %: 83 %
Platelet Count: 299 10*3/uL (ref 150–400)
RBC: 3.83 MIL/uL — ABNORMAL LOW (ref 3.87–5.11)
RDW: 13.2 % (ref 11.5–15.5)
WBC Count: 7.3 10*3/uL (ref 4.0–10.5)
nRBC: 0.3 % — ABNORMAL HIGH (ref 0.0–0.2)

## 2024-01-02 MED ORDER — HEPARIN SOD (PORK) LOCK FLUSH 100 UNIT/ML IV SOLN
500.0000 [IU] | Freq: Once | INTRAVENOUS | Status: AC | PRN
  Administered 2024-01-02: 500 [IU]

## 2024-01-02 MED ORDER — SODIUM CHLORIDE 0.9% FLUSH
10.0000 mL | INTRAVENOUS | Status: DC | PRN
  Administered 2024-01-02: 10 mL

## 2024-01-03 ENCOUNTER — Telehealth: Payer: Self-pay | Admitting: Hematology

## 2024-01-03 NOTE — Telephone Encounter (Signed)
 Spoke with patient confirming upcoming appointment

## 2024-01-04 ENCOUNTER — Other Ambulatory Visit: Payer: Self-pay

## 2024-01-09 ENCOUNTER — Encounter: Payer: Self-pay | Admitting: Hematology

## 2024-01-09 NOTE — Progress Notes (Signed)
 HEMATOLOGY/ONCOLOGY CLINIC VISIT NOTE  Date of service: .01/02/2024  Patient Care Team: Pcp, No as PCP - General Onesimo [name], MD as Consulting Physician (Hematology)  Chief complaint: Follow-up for Hodgkin's lymphoma  Diagnosis:   Refractory Mixed cellularity Hodgkin's lymphoma IVBE with extensive lymphadenopathy including right axillary, mediastinal and upper retroperitoneal and now biopsy proven pulmonary involvement. She was noted to have significant constitutional symptoms including significant weight loss, fevers chills and some night sweats.  Current Treatment:  Lost to f/u for about 2 yrs  Previous treatment  5 cycles of AVD (without bleomycin due to lung involvement and DLCO of 37%, active smoker) Multiple avoidable treatment delays due to the patient's noncompliance with follow-up for avoidable reasons. She has been counseled repeatedly that this would increase the likelihood of unfavorable outcome.  2nd line therapy with Bendamustine  + Brentuximab s/p 7 cycles.  3rd line therapy with ICE s/p 2 cycles   Pembrolizumab - 4th line. Patient declined HDT/Auto HSCT or consideration of Car-T cell therapy.  INTERVAL HISTORY:  Ms. Stutsman is a 38 y.o. female is here for delayed f/u for her CHL. She missed her previous appointment. Her previous PET/CT in 08/2023 had shown resolution of FDG avid disease. She has delayed f/u with Dr Katherleen team at [facility] for considerations of a consolidative transplant but notes that she is in the process of setting up a followup.  REVIEW OF SYSTEMS:   10 Point review of Systems was done is negative except as noted above.   Past Medical History:  Diagnosis Date   ARDS (adult respiratory distress syndrome) (HCC)    Asthma    Hodgkin lymphoma (HCC)    Hypertension     . Past Surgical History:  Procedure Laterality Date   AXILLARY LYMPH NODE BIOPSY Right 03/19/2016   Procedure: AXILLARY LYMPH NODE BIOPSY;  Surgeon: Krystal Spinner, MD;  Location: [facility];  Service: General;  Laterality: Right;   IR GENERIC HISTORICAL  12/22/2016   IR FLUORO GUIDE PORT INSERTION LEFT 12/22/2016 [facility]   IR GENERIC HISTORICAL  12/22/2016   IR US  GUIDE VASC ACCESS LEFT 12/22/2016 [facility]   IR GENERIC HISTORICAL  12/22/2016   IR CV LINE INJECTION 12/22/2016 [facility]   IR GENERIC HISTORICAL  12/22/2016   IR REMOVAL TUN ACCESS W/ PORT W/O FL MOD SED 12/22/2016 [facility]   VIDEO BRONCHOSCOPY Bilateral 11/26/2016   Procedure: VIDEO BRONCHOSCOPY WITH FLUORO;  Surgeon: Harden LULLA Staff, MD;  Location: [facility];  Service: Cardiopulmonary;  Laterality: Bilateral;    . Social History   Tobacco Use   Smoking status: Some Days    Current packs/day: 0.00    Average packs/day: 0.5 packs/day for 15.0 years (7.5 ttl pk-yrs)    Types: Cigarettes    Start date: 03/27/2001    Last attempt to quit: 03/27/2016    Years since quitting: 7.7   Smokeless tobacco: Never  Vaping Use   Vaping status: Never Used  Substance Use Topics   Alcohol use: Yes    Comment: occasional now   Drug use: Not Currently    ALLERGIES:  is allergic to doxorubicin  hcl liposomal.  MEDICATIONS:  Current Outpatient Medications  Medication Sig Dispense Refill   TYLENOL  500 MG tablet Take 500 mg by mouth every 6 (six) hours as needed for mild pain or headache.     No current facility-administered medications for this visit.   Facility-Administered Medications Ordered in Other Visits  Medication Dose Route Frequency Provider Last Rate  Last Admin   sodium chloride  flush (NS) 0.9 % injection 10 mL  10 mL Intracatheter PRN Onesimo [name], MD        PHYSICAL EXAMINATION:  .BP (!) 151/90 (BP Location: Left Arm, Patient Position: Sitting)   Pulse 100   Temp (!) 97.3 F (36.3 C) (Temporal)   Resp 16   Wt 145 lb 6.4 oz (66 kg)   BMI 22.77 kg/m   GENERAL:alert, in no acute distress and comfortable SKIN: no acute rashes, no significant  lesions EYES: conjunctiva are pink and non-injected, sclera anicteric OROPHARYNX: MMM, no exudates, no oropharyngeal erythema or ulceration NECK: supple, no JVD LYMPH:  no palpable lymphadenopathy in the cervical, axillary or inguinal regions LUNGS: clear to auscultation b/l with normal respiratory effort HEART: regular rate & rhythm ABDOMEN:  normoactive bowel sounds , non tender, not distended. Extremity: no pedal edema PSYCH: alert & oriented x 3 with fluent speech NEURO: no focal motor/sensory deficits   ECOG FS:1 - Symptomatic but completely ambulatory  Vitals:   01/02/24 1510  BP: (!) 151/90  Pulse: 100  Resp: 16  Temp: (!) 97.3 F (36.3 C)    Wt Readings from Last 3 Encounters:  01/02/24 145 lb 6.4 oz (66 kg)  10/13/23 140 lb 6.4 oz (63.7 kg)  09/14/23 141 lb 8 oz (64.2 kg)   Body mass index is 22.77 kg/m.    LABORATORY DATA:   I have reviewed the data as listed     Latest Ref Rng & Units 01/02/2024    2:15 PM 10/13/2023    9:22 AM 09/14/2023    8:21 AM  CBC  WBC 4.0 - 10.5 K/uL 7.3  4.0  21.4   Hemoglobin 12.0 - 15.0 g/dL 87.5  89.2  8.4   Hematocrit 36.0 - 46.0 % 35.6  33.8  25.7   Platelets 150 - 400 K/uL 299  230  156    . CBC    Component Value Date/Time   WBC 7.3 01/02/2024 1415   WBC 26.7 (H) 09/10/2023 0550   RBC 3.83 (L) 01/02/2024 1415   HGB 12.4 01/02/2024 1415   HGB 11.7 07/20/2017 1315   HCT 35.6 (L) 01/02/2024 1415   HCT 35.2 07/20/2017 1315   PLT 299 01/02/2024 1415   PLT 266 07/20/2017 1315   MCV 93.0 01/02/2024 1415   MCV 93.2 07/20/2017 1315   MCH 32.4 01/02/2024 1415   MCHC 34.8 01/02/2024 1415   RDW 13.2 01/02/2024 1415   RDW 14.5 07/20/2017 1315   LYMPHSABS 0.6 (L) 01/02/2024 1415   LYMPHSABS 0.3 (L) 07/20/2017 1315   MONOABS 0.6 01/02/2024 1415   MONOABS 0.5 07/20/2017 1315   EOSABS 0.1 01/02/2024 1415   EOSABS 0.2 07/20/2017 1315   BASOSABS 0.0 01/02/2024 1415   BASOSABS 0.0 07/20/2017 1315   .    Latest Ref Rng &  Units 01/02/2024    2:15 PM 10/13/2023    9:22 AM 09/14/2023    8:21 AM  CMP  Glucose 70 - 99 mg/dL 75  78  896   BUN 6 - 20 mg/dL 10  8  9    Creatinine 0.44 - 1.00 mg/dL 9.24  9.24  9.24   Sodium 135 - 145 mmol/L 133  139  139   Potassium 3.5 - 5.1 mmol/L 4.1  4.1  3.5   Chloride 98 - 111 mmol/L 97  103  101   CO2 22 - 32 mmol/L 23  30  31    Calcium   8.9 - 10.3 mg/dL 8.9  8.9  8.8   Total Protein 6.5 - 8.1 g/dL 7.6  6.5  6.1   Total Bilirubin 0.0 - 1.2 mg/dL 0.4  0.6  0.9   Alkaline Phos 38 - 126 U/L 113  96  169   AST 15 - 41 U/L 34  18  26   ALT 0 - 44 U/L 15  11  34      RADIOGRAPHIC STUDIES: I have personally reviewed the radiological images as listed and agreed with the findings in the report. No results found.  ASSESSMENT & PLAN:   38 y.o. African-American female with  #1 Refractory/Progressive Mixed cellularity Hodgkin's lymphoma IV BE with extensive lymphadenopathy including right axillary, mediastinal and upper retroperitoneal and now biopsy proven pulmonary involvement. She was noted to have significant constitutional symptoms including significant weight loss, fevers chills and some night sweats. HIV negative Hepatitis C and hepatitis B serologies negative. Echo with normal ejection fraction. Patient was treated with 5 cycles of AVD (Bleomycin held due to poor DLCO 37% and ongoing smoking). Multiple avoidable treatment delays due to the patient's noncompliance with follow-up for avoidable reasons. She has been counseled repeatedly that this would increase the likelihood of unfavorable outcome.   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 S/p 6 cycles of 2nd line treatment with Bendamustine /Brentuximab And 1 cycle of Bretuximab alone   -PET/CT scan results from 04/14/2017 were discussed in details. She appears to have some persistent disease in her right lung at Deauville 5. It is difficult to say if this is recurrent or persistent disease  since the patient failed to follow-up on multiple scheduled PET/CT scans in the early part and prior to her second line treatment.   Patient was lost to followup for >1 yr   09/26/18 PET/CT revealed Imaging findings compatible with recurrence of disease. 2. Multiple new large areas of hypermetabolic nodularity and airspace consolidation within both lungs which is presumed to represent pulmonary involvement by lymphoma. Deauville criteria 5. 3. New hypermetabolic left supraclavicular, left retroperitoneal, and bilateral pelvic lymph nodes. Deauville criteria 5. 4. Multifocal hypermetabolic osseous lesions.  Deauville criteria 5. 5. Small volume of ascites, new.   12/06/18 PET/CT revealed Generally improved appearance with previously mostly Deauville 5 activity now mostly Deauville 4 activity. 2. Layering of much of the airspace opacity in the lungs, although considerable right perihilar airspace opacity remains. The remaining pulmonary opacities are assess this Deauville 5 on the right and over L4 on the left, and were previously Deauville 5 bilaterally. 3. Mildly reduced size and moderately reduced activity in the abdominopelvic lymph nodes which are now dove L4. 4. The previously seen left supraclavicular lymph node seems to have completely resolved (Deauville 0). 5. The skeletal lesions remain at Deauville 4, although in absolute terms have decreased in SUV compared to previous. 6. No new regions of malignant involvement compared to prior exam. 7. Other imaging findings of potential clinical significance: Low-density blood pool suggests anemia. Pectus excavatum. Volume loss in the right hemithorax.  06/13/19 PET/CT revealed No abnormal hypermetabolism (Deauville 1). 2. Mixed lytic and sclerotic osseous lesions appear more prominent than on 12/06/2018 but do not have associated hypermetabolism, indicative of interval healing. 01/31/2020 CT ABDOMEN PELVIS W CONTRAST (Accession [id number]) and CT CHEST W  CONTRAST (Accession [id number]) revealed 1. Small pre pericardial lymph node, not present on the prior study. 2. Stable volume loss in the right chest with shift of mediastinal structures into the  right chest. 3. No change in the appearance of multifocal bony sclerosis, without signs of FDG uptake on the previous exam, attention on follow-up.   #2 s/p hypoxic respiratory failure with dense right lung consolidation and left upper lobe consolidation with SVC syndrome. Patient has completed palliative radiation to the right lung mass causing SVC compression.  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   #4 Non compliance with clinic and treatment followup. Missed 2nd dose of bendamustine  with C2. Has missed multiple appointment for her PET/CT and missed appointment at [facility] for consideration of Transplant.  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 -she wants to get in touch with her transplant team and consider consolidative transplant option now and wants to hold off on immunotherapy or other systemic treatments at this time.  FOLLOW-UP:  Follow-up with Dr. Vaidya at [facility] for transplant consideration Return to clinic with Dr. Onesimo with port flush and labs in 2 months    .The total time spent in the appointment was 25 minutes*  .  All of the patient's questions were answered with apparent satisfaction. The patient knows to call the clinic with any problems, questions or concerns.   Emaline Onesimo MD MS AAHIVMS [facility] Hematology/Oncology Physician [facility]  .*Total Encounter Time as defined by the Centers for Medicare and Medicaid Services includes, in addition to the face-to-face time of a patient visit (documented in the note above) non-face-to-face time: obtaining and reviewing outside history, ordering and reviewing medications, tests or procedures, care coordination (communications with other health care professionals or caregivers) and documentation in the medical record.

## 2024-03-01 ENCOUNTER — Other Ambulatory Visit: Payer: Self-pay

## 2024-03-01 DIAGNOSIS — C8198 Hodgkin lymphoma, unspecified, lymph nodes of multiple sites: Secondary | ICD-10-CM

## 2024-03-02 ENCOUNTER — Inpatient Hospital Stay: Payer: Medicaid Other | Admitting: Hematology

## 2024-03-02 ENCOUNTER — Inpatient Hospital Stay: Payer: Medicaid Other | Attending: Hematology

## 2024-03-02 VITALS — BP 153/86 | HR 85 | Temp 98.1°F | Resp 18 | Ht 67.0 in | Wt 143.0 lb

## 2024-03-02 DIAGNOSIS — F17210 Nicotine dependence, cigarettes, uncomplicated: Secondary | ICD-10-CM | POA: Insufficient documentation

## 2024-03-02 DIAGNOSIS — C8198 Hodgkin lymphoma, unspecified, lymph nodes of multiple sites: Secondary | ICD-10-CM

## 2024-03-02 DIAGNOSIS — Z7189 Other specified counseling: Secondary | ICD-10-CM

## 2024-03-02 DIAGNOSIS — Z95828 Presence of other vascular implants and grafts: Secondary | ICD-10-CM

## 2024-03-02 DIAGNOSIS — C8128 Mixed cellularity classical Hodgkin lymphoma, lymph nodes of multiple sites: Secondary | ICD-10-CM | POA: Diagnosis present

## 2024-03-02 DIAGNOSIS — Z79899 Other long term (current) drug therapy: Secondary | ICD-10-CM | POA: Insufficient documentation

## 2024-03-02 LAB — CMP (CANCER CENTER ONLY)
ALT: 19 U/L (ref 0–44)
AST: 27 U/L (ref 15–41)
Albumin: 4.8 g/dL (ref 3.5–5.0)
Alkaline Phosphatase: 89 U/L (ref 38–126)
Anion gap: 10 (ref 5–15)
BUN: 9 mg/dL (ref 6–20)
CO2: 27 mmol/L (ref 22–32)
Calcium: 9.1 mg/dL (ref 8.9–10.3)
Chloride: 99 mmol/L (ref 98–111)
Creatinine: 0.75 mg/dL (ref 0.44–1.00)
GFR, Estimated: >60 mL/min (ref >60)
Glucose, Bld: 65 mg/dL — ABNORMAL LOW (ref 70–99)
Potassium: 4.2 mmol/L (ref 3.5–5.1)
Sodium: 136 mmol/L (ref 135–145)
Total Bilirubin: 0.6 mg/dL (ref 0.0–1.2)
Total Protein: 7.2 g/dL (ref 6.5–8.1)

## 2024-03-02 LAB — CBC WITH DIFFERENTIAL (CANCER CENTER ONLY)
Abs Immature Granulocytes: 0.01 10*3/uL (ref 0.00–0.07)
Basophils Absolute: 0 10*3/uL (ref 0.0–0.1)
Basophils Relative: 1 %
Eosinophils Absolute: 0.1 10*3/uL (ref 0.0–0.5)
Eosinophils Relative: 3 %
HCT: 36.7 % (ref 36.0–46.0)
Hemoglobin: 12.6 g/dL (ref 12.0–15.0)
Immature Granulocytes: 0 %
Lymphocytes Relative: 13 %
Lymphs Abs: 0.4 10*3/uL — ABNORMAL LOW (ref 0.7–4.0)
MCH: 33 pg (ref 26.0–34.0)
MCHC: 34.3 g/dL (ref 30.0–36.0)
MCV: 96.1 fL (ref 80.0–100.0)
Monocytes Absolute: 0.4 10*3/uL (ref 0.1–1.0)
Monocytes Relative: 13 %
Neutro Abs: 2.5 10*3/uL (ref 1.7–7.7)
Neutrophils Relative %: 70 %
Platelet Count: 211 10*3/uL (ref 150–400)
RBC: 3.82 MIL/uL — ABNORMAL LOW (ref 3.87–5.11)
RDW: 12.5 % (ref 11.5–15.5)
WBC Count: 3.5 10*3/uL — ABNORMAL LOW (ref 4.0–10.5)
nRBC: 0 % (ref 0.0–0.2)

## 2024-03-02 MED ORDER — SODIUM CHLORIDE 0.9% FLUSH
10.0000 mL | INTRAVENOUS | Status: DC | PRN
  Administered 2024-03-02: 10 mL

## 2024-03-02 MED ORDER — HEPARIN SOD (PORK) LOCK FLUSH 100 UNIT/ML IV SOLN
500.0000 [IU] | Freq: Once | INTRAVENOUS | Status: AC | PRN
  Administered 2024-03-02: 500 [IU]

## 2024-03-02 NOTE — Progress Notes (Signed)
 HEMATOLOGY/ONCOLOGY CLINIC VISIT NOTE  Date of service: 03/02/24  Patient Care Team: Pcp, No as PCP - General Heidi Maine, MD as Consulting Physician (Hematology)  Chief complaint: Follow-up for Hodgkin's lymphoma  Diagnosis:   Refractory Mixed cellularity Hodgkin's lymphoma IVBE with extensive lymphadenopathy including right axillary, mediastinal and upper retroperitoneal and now biopsy proven pulmonary involvement. She was noted to have significant constitutional symptoms including significant weight loss, fevers chills and some night sweats.  Current Treatment:  Lost to f/u for about 2 yrs  Previous treatment  5 cycles of AVD (without bleomycin due to lung involvement and DLCO of 37%, active smoker) Multiple avoidable treatment delays due to the patient's noncompliance with follow-up for avoidable reasons. She has been counseled repeatedly that this would increase the likelihood of unfavorable outcome.  2nd line therapy with Bendamustine + Brentuximab s/p 7 cycles.  3rd line therapy with ICE s/p 2 cycles   Pembrolizumab- 4th line. Patient declined HDT/Auto HSCT or consideration of Car-T cell therapy now reconsidering this and still thinking.  INTERVAL HISTORY:  [name] is a 38 y.o. female is here for delayed f/u for her CHL. She was last seen by me on 01/02/2024.  Today, she is accommpanied by her boyfriend. Patient reports that she has been doing well since her last clinical visit.   Patient reports that she last spoke with her transplant team two months ago. She has not been set up for a follow-up appointment with Dr. Alphonsa Gin at this time. Patient reports that she is considering a bone marrow transplant at this time and plans to connect with them soon.  Patient reports dental issues which are not significantly bothersome. She reports that a dentist referral has been placed and she will see her dentist next week.   She reports that she will reach out to her  dentist to inquire whether her dental procedure is absolutely necessary or not.   Patient reports no new symptoms since her last clinical visit. She denies any new lumps/bumps, change in breathing habits, fever, chills, night sweats, abdominal pain, or leg swelling.   Unfortunately, patient received news that her father passed away during the clinical visit.   REVIEW OF SYSTEMS:   10 Point review of Systems was done is negative except as noted above.   Past Medical History:  Diagnosis Date   ARDS (adult respiratory distress syndrome) (HCC)    Asthma    Hodgkin lymphoma (HCC)    Hypertension     . Past Surgical History:  Procedure Laterality Date   AXILLARY LYMPH NODE BIOPSY Right 03/19/2016   Procedure: AXILLARY LYMPH NODE BIOPSY;  Surgeon: Darnell Level, MD;  Location: [facility];  Service: General;  Laterality: Right;   IR GENERIC HISTORICAL  12/22/2016   IR FLUORO GUIDE PORT INSERTION LEFT 12/22/2016 [facility]   IR GENERIC HISTORICAL  12/22/2016   IR US GUIDE VASC ACCESS LEFT 12/22/2016 [facility]   IR GENERIC HISTORICAL  12/22/2016   IR CV LINE INJECTION 12/22/2016 [facility]   IR GENERIC HISTORICAL  12/22/2016   IR REMOVAL TUN ACCESS W/ PORT W/O FL MOD SED 12/22/2016 [facility]   VIDEO BRONCHOSCOPY Bilateral 11/26/2016   Procedure: VIDEO BRONCHOSCOPY WITH FLUORO;  Surgeon: Oretha Milch, MD;  Location: [facility];  Service: Cardiopulmonary;  Laterality: Bilateral;    . Social History   Tobacco Use   Smoking status: Some Days    Current packs/day: 0.00    Average packs/day: 0.5 packs/day for 15.0  years (7.5 ttl pk-yrs)    Types: Cigarettes    Start date: 03/27/2001    Last attempt to quit: 03/27/2016    Years since quitting: 7.9   Smokeless tobacco: Never  Vaping Use   Vaping status: Never Used  Substance Use Topics   Alcohol use: Yes    Comment: occasional now   Drug use: Not Currently    ALLERGIES:  is allergic to doxorubicin hcl  liposomal.  MEDICATIONS:  Current Outpatient Medications  Medication Sig Dispense Refill   TYLENOL 500 MG tablet Take 500 mg by mouth every 6 (six) hours as needed for mild pain or headache.     No current facility-administered medications for this visit.   Facility-Administered Medications Ordered in Other Visits  Medication Dose Route Frequency Provider Last Rate Last Admin   sodium chloride flush (NS) 0.9 % injection 10 mL  10 mL Intracatheter PRN Candise Che, Corene Cornea, MD        PHYSICAL EXAMINATION:  .BP (!) 153/86 (BP Location: Left Arm, Patient Position: Sitting) Comment: nurse is aware  Pulse 85   Temp 98.1 F (36.7 C) (Temporal)   Resp 18   Ht 5\' 7"  (1.702 m)   Wt 143 lb (64.9 kg)   SpO2 100%   BMI 22.40 kg/m   GENERAL:alert, in no acute distress and comfortable SKIN: no acute rashes, no significant lesions EYES: conjunctiva are pink and non-injected, sclera anicteric OROPHARYNX: MMM, no exudates, no oropharyngeal erythema or ulceration NECK: supple, no JVD LYMPH:  no palpable lymphadenopathy in the cervical, axillary or inguinal regions LUNGS: clear to auscultation b/l with normal respiratory effort HEART: regular rate & rhythm ABDOMEN:  normoactive bowel sounds , non tender, not distended. Extremity: no pedal edema PSYCH: alert & oriented x 3 with fluent speech NEURO: no focal motor/sensory deficits      LABORATORY DATA:   I have reviewed the data as listed     Latest Ref Rng & Units 01/02/2024    2:15 PM 10/13/2023    9:22 AM 09/14/2023    8:21 AM  CBC  WBC 4.0 - 10.5 K/uL 7.3  4.0  21.4   Hemoglobin 12.0 - 15.0 g/dL 24.4  01.0  8.4   Hematocrit 36.0 - 46.0 % 35.6  33.8  25.7   Platelets 150 - 400 K/uL 299  230  156    . CBC    Component Value Date/Time   WBC 7.3 01/02/2024 1415   WBC 26.7 (H) 09/10/2023 0550   RBC 3.83 (L) 01/02/2024 1415   HGB 12.4 01/02/2024 1415   HGB 11.7 07/20/2017 1315   HCT 35.6 (L) 01/02/2024 1415   HCT 35.2 07/20/2017  1315   PLT 299 01/02/2024 1415   PLT 266 07/20/2017 1315   MCV 93.0 01/02/2024 1415   MCV 93.2 07/20/2017 1315   MCH 32.4 01/02/2024 1415   MCHC 34.8 01/02/2024 1415   RDW 13.2 01/02/2024 1415   RDW 14.5 07/20/2017 1315   LYMPHSABS 0.6 (L) 01/02/2024 1415   LYMPHSABS 0.3 (L) 07/20/2017 1315   MONOABS 0.6 01/02/2024 1415   MONOABS 0.5 07/20/2017 1315   EOSABS 0.1 01/02/2024 1415   EOSABS 0.2 07/20/2017 1315   BASOSABS 0.0 01/02/2024 1415   BASOSABS 0.0 07/20/2017 1315   .    Latest Ref Rng & Units 01/02/2024    2:15 PM 10/13/2023    9:22 AM 09/14/2023    8:21 AM  CMP  Glucose 70 - 99 mg/dL 75  78  103   BUN 6 - 20 mg/dL 10  8  9    Creatinine 0.44 - 1.00 mg/dL 1.61  0.96  0.45   Sodium 135 - 145 mmol/L 133  139  139   Potassium 3.5 - 5.1 mmol/L 4.1  4.1  3.5   Chloride 98 - 111 mmol/L 97  103  101   CO2 22 - 32 mmol/L 23  30  31    Calcium 8.9 - 10.3 mg/dL 8.9  8.9  8.8   Total Protein 6.5 - 8.1 g/dL 7.6  6.5  6.1   Total Bilirubin 0.0 - 1.2 mg/dL 0.4  0.6  0.9   Alkaline Phos 38 - 126 U/L 113  96  169   AST 15 - 41 U/L 34  18  26   ALT 0 - 44 U/L 15  11  34      RADIOGRAPHIC STUDIES: I have personally reviewed the radiological images as listed and agreed with the findings in the report. No results found.  ASSESSMENT & PLAN:   38 y.o. African-American female with  #1 Refractory/Progressive Mixed cellularity Hodgkin's lymphoma IV BE with extensive lymphadenopathy including right axillary, mediastinal and upper retroperitoneal and now biopsy proven pulmonary involvement. She was noted to have significant constitutional symptoms including significant weight loss, fevers chills and some night sweats. HIV negative Hepatitis C and hepatitis B serologies negative. Echo with normal ejection fraction. Patient was treated with 5 cycles of AVD (Bleomycin held due to poor DLCO 37% and ongoing smoking). Multiple avoidable treatment delays due to the patient's noncompliance with  follow-up for avoidable reasons. She has been counseled repeatedly that this would increase the likelihood of unfavorable outcome.   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 S/p 6 cycles of 2nd line treatment with Bendamustine/Brentuximab And 1 cycle of Bretuximab alone   -PET/CT scan results from 04/14/2017 were discussed in details. She appears to have some persistent disease in her right lung at Deauville 5. It is difficult to say if this is recurrent or persistent disease since the patient failed to follow-up on multiple scheduled PET/CT scans in the early part and prior to her second line treatment.   Patient was lost to followup for >1 yr   09/26/18 PET/CT revealed Imaging findings compatible with recurrence of disease. 2. Multiple new large areas of hypermetabolic nodularity and airspace consolidation within both lungs which is presumed to represent pulmonary involvement by lymphoma. Deauville criteria 5. 3. New hypermetabolic left supraclavicular, left retroperitoneal, and bilateral pelvic lymph nodes. Deauville criteria 5. 4. Multifocal hypermetabolic osseous lesions.  Deauville criteria 5. 5. Small volume of ascites, new.   12/06/18 PET/CT revealed Generally improved appearance with previously mostly Deauville 5 activity now mostly Deauville 4 activity. 2. Layering of much of the airspace opacity in the lungs, although considerable right perihilar airspace opacity remains. The remaining pulmonary opacities are assess this Deauville 5 on the right and over L4 on the left, and were previously Deauville 5 bilaterally. 3. Mildly reduced size and moderately reduced activity in the abdominopelvic lymph nodes which are now dove L4. 4. The previously seen left supraclavicular lymph node seems to have completely resolved (Deauville 0). 5. The skeletal lesions remain at Deauville 4, although in absolute terms have decreased in SUV compared to previous. 6. No new regions of  malignant involvement compared to prior exam. 7. Other imaging findings of potential clinical significance: Low-density blood pool suggests anemia. Pectus excavatum. Volume loss in the right  hemithorax.  06/13/19 PET/CT revealed "No abnormal hypermetabolism (Deauville 1). 2. Mixed lytic and sclerotic osseous lesions appear more prominent than on 12/06/2018 but do not have associated hypermetabolism, indicative of interval healing." 01/31/2020 CT ABDOMEN PELVIS W CONTRAST (Accession [id number]) and CT CHEST W CONTRAST (Accession [id number]) revealed "1. Small pre pericardial lymph node, not present on the prior study. 2. Stable volume loss in the right chest with shift of mediastinal structures into the right chest. 3. No change in the appearance of multifocal bony sclerosis, without signs of FDG uptake on the previous exam, attention on follow-up."   #2 s/p hypoxic respiratory failure with dense right lung consolidation and left upper lobe consolidation with SVC syndrome. Patient has completed palliative radiation to the right lung mass causing SVC compression.  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   #4 Non compliance with clinic and treatment followup. Missed 2nd dose of bendamustine with C2. Has missed multiple appointment for her PET/CT and missed appointment at [facility] for [facility] of Transplant.  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 CBC stable, showed WBC of 3.5K, hemoglobin of 12.6, and platelets of 211K. -her blood sugar is mildly low at 65 mg/dL, CMP is otherwise normal -did not feel an enlarged liver and spleen during physical examination -there is no clinical sign or lab evidence of hodgkin's lymphoma progression at this time.  -educated patient that a bone marrow transplant would give her a very good chance of curing her disease -discussed concern that prolonging decision-making for 6 months regarding whether or not to proceed with a bone marrow transplant does increase risks and may be limiting in regards to treatment options and whether or not she may still be able to proceed with a bone marrow transplant if there is any concern for progression down the line. There is no concern for disease progression at this time.  -would recommend holding off on dental work at this time unless it is absolutely nessessary given that consideration of a bone marrow transplant would be a medical priority.  -connect with transplant team at [facility] to discuss considerations of bone marrow transplant  -answered all of patient's questions in detail  FOLLOW-UP: RTC with Dr Alphonsa Gin to f/u regarding AUto HSCT RTC with Dr Candise Che in 4 months with labs  The total time spent in the appointment was 20 minutes* .  All of the patient's questions were answered with apparent satisfaction. The patient knows to call the clinic with any problems, questions or concerns.   Wyvonnia Lora MD MS AAHIVMS [facility] Hematology/Oncology Physician [facility]  .*Total Encounter Time as defined by the Centers for Medicare and Medicaid Services includes, in addition to the face-to-face time of a patient visit (documented in the note above) non-face-to-face time: obtaining and reviewing outside history, ordering and reviewing medications, tests or procedures, care coordination (communications with other health care professionals or caregivers) and  documentation in  the medical record.    I,Mitra Faeizi,acting as a Neurosurgeon for Wyvonnia Lora, MD.,have documented all relevant documentation on the behalf of Wyvonnia Lora, MD,as directed by  Wyvonnia Lora, MD while in the presence of Wyvonnia Lora, MD.  .I have reviewed the above documentation for accuracy and completeness, and I agree with the above. Heidi Maine MD

## 2024-03-08 ENCOUNTER — Encounter: Payer: Self-pay | Admitting: Hematology

## 2024-04-14 ENCOUNTER — Other Ambulatory Visit: Payer: Self-pay

## 2024-04-24 ENCOUNTER — Encounter (HOSPITAL_COMMUNITY): Payer: Self-pay

## 2024-04-24 ENCOUNTER — Ambulatory Visit (HOSPITAL_COMMUNITY)
Admission: EM | Admit: 2024-04-24 | Discharge: 2024-04-24 | Disposition: A | Discharge status: Home / Self Care | Attending: Physician Assistant | Admitting: Physician Assistant

## 2024-04-24 ENCOUNTER — Ambulatory Visit (INDEPENDENT_AMBULATORY_CARE_PROVIDER_SITE_OTHER)

## 2024-04-24 DIAGNOSIS — J4521 Mild intermittent asthma with (acute) exacerbation: Secondary | ICD-10-CM

## 2024-04-24 DIAGNOSIS — R0602 Shortness of breath: Secondary | ICD-10-CM

## 2024-04-24 DIAGNOSIS — R062 Wheezing: Secondary | ICD-10-CM

## 2024-04-24 DIAGNOSIS — J209 Acute bronchitis, unspecified: Secondary | ICD-10-CM

## 2024-04-24 MED ORDER — IPRATROPIUM-ALBUTEROL 0.5-2.5 (3) MG/3ML IN SOLN
RESPIRATORY_TRACT | Status: AC
  Filled 2024-04-24: qty 3

## 2024-04-24 MED ORDER — ALBUTEROL SULFATE HFA 108 (90 BASE) MCG/ACT IN AERS: 1.0000 | INHALATION_SPRAY | Freq: Four times a day (QID) | RESPIRATORY_TRACT | 0 refills | Status: DC | PRN

## 2024-04-24 MED ORDER — IPRATROPIUM-ALBUTEROL 0.5-2.5 (3) MG/3ML IN SOLN
3.0000 mL | Freq: Once | RESPIRATORY_TRACT | Status: AC
  Administered 2024-04-24: 3 mL via RESPIRATORY_TRACT

## 2024-04-24 MED ORDER — AZITHROMYCIN 250 MG PO TABS: ORAL_TABLET | ORAL | 0 refills | Status: DC

## 2024-04-24 NOTE — Discharge Instructions (Signed)
 You were seen today for concerns of shortness of breath, coughing and wheezing.  At this time you do still have significant wheezing on exam.  Your x-ray did not show signs of an acute illness such as pneumonia but your shortness of breath and low oxygen saturation when you came in are concerning.  We administered a DuoNeb treatment which seemed to improve your oxygen saturation and help with your breathing.  I have sent in a refill of your albuterol  inhaler for you to use as needed to assist with shortness of breath and coughing.  I am also sending in a prescription for medication called azithromycin .  This is also called a Z-Pak and helps provide bacterial coverage and reduce pulmonary inflammation.  Please make sure that you are following up with your primary care provider for ongoing management and monitoring.  If at any point you start to have more severe shortness of breath, difficulty breathing, chest pain, fever or chills that are not responding to medication please go to the emergency room as these could be signs of a medical emergency.

## 2024-04-24 NOTE — ED Triage Notes (Signed)
 Patient reports that she was at a birthday party last week and may have been exposed to a virus. Patient states she has had SOB, a non productive cough, and nasal congestion x 1 week.  Patient is out of her Albuterol  inhaler.  Patient is currently taking Mucinex .  Patient states symptoms are worse at night. Patient reports a history of Hodgkins Lymphoma and states there was lung involvement.

## 2024-04-24 NOTE — ED Provider Notes (Addendum)
 [facility]    CSN: [id number] Arrival date & time: 04/24/24  1404      History   Chief Complaint Chief Complaint  Patient presents with   Shortness of Breath   Cough   Nasal Congestion    HPI Heidi [name] is a 38 y.o. female.   HPI  She reports she was at a family gathering last week and seems to have developed URI type symptoms She states her son and niece had similar symptoms but hers seem to be lasting longer She states she has previous hx of asthma and Hodgkin Lymphoma with lung involvement She reports persistent coughing, wheezing. She states her cough is productive of mucus She reports concerns for her breathing and states her symptoms are worse at night  Interventions: Mucinex , Nyquil  She reports previously having inhalers but does not have these right now   Past Medical History:  Diagnosis Date   ARDS (adult respiratory distress syndrome) (HCC)    Asthma    Hodgkin lymphoma (HCC)    Hypertension     Patient Active Problem List   Diagnosis Date Noted   Pericardial effusion 09/10/2023   Pleural effusion 09/10/2023   Thrombocytopenia (HCC) 09/10/2023   Acute hypoxic respiratory failure (HCC) 09/08/2023   Post herpetic neuralgia    Hodgkin's lymphoma (HCC) 11/14/2018   Herpes zoster without complication    Counseling regarding advance care planning and goals of care 10/09/2018   Hodgkin's disease in adult [facility]) 10/09/2018   Port-A-Cath in place 09/28/2018   Pruritus 01/20/2017   Central line complication    Deep vein thrombosis (DVT) of upper extremity (HCC)    SVC syndrome    Dyspnea 12/14/2016   Swelling 12/14/2016   Symptomatic anemia 11/26/2016   CAP (community acquired pneumonia) 11/23/2016   PNA (pneumonia) 11/23/2016   Hyperpigmentation 06/04/2016   Hypersensitivity reaction 05/04/2016   Encounter for antineoplastic chemotherapy    Hypokalemia 04/20/2016   Volume overload 04/20/2016   S/P bronchoscopy with biopsy     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)    HCAP (healthcare-associated pneumonia) 04/10/2016   Hyponatremia 04/10/2016   Thrombocytosis 04/10/2016   Pneumonitis 03/16/2016   Postobstructive pneumonia 03/16/2016   Acute respiratory failure with hypoxia (HCC) 03/16/2016   Leukocytosis 03/16/2016   Sepsis due to pneumonia (HCC) 03/16/2016   Lung mass    Lymphadenopathy     Past Surgical History:  Procedure Laterality Date   AXILLARY LYMPH NODE BIOPSY Right 03/19/2016   Procedure: AXILLARY LYMPH NODE BIOPSY;  Surgeon: Oralee Billow, MD;  Location: [facility];  Service: General;  Laterality: Right;   IR GENERIC HISTORICAL  12/22/2016   IR FLUORO GUIDE PORT INSERTION LEFT 12/22/2016 [facility]   IR GENERIC HISTORICAL  12/22/2016   IR US  GUIDE VASC ACCESS LEFT 12/22/2016 [facility]   IR GENERIC HISTORICAL  12/22/2016   IR CV LINE INJECTION 12/22/2016 [facility]   IR GENERIC HISTORICAL  12/22/2016   IR REMOVAL TUN ACCESS W/ PORT W/O FL MOD SED 12/22/2016 [facility]   VIDEO BRONCHOSCOPY Bilateral 11/26/2016   Procedure: VIDEO BRONCHOSCOPY WITH FLUORO;  Surgeon: Lind Repine, MD;  Location: [facility];  Service: Cardiopulmonary;  Laterality: Bilateral;    OB History   No obstetric history on file.      Home Medications    Prior to Admission medications   Medication Sig Start Date End Date Taking? Authorizing Provider  albuterol  (VENTOLIN  HFA) 108 (90 Base)  MCG/ACT inhaler Inhale 1-2 puffs into the lungs every 6 (six) hours as needed for wheezing or shortness of breath. 04/24/24  Yes Reigan Tolliver E, PA-C  azithromycin  (ZITHROMAX ) 250 MG tablet Take 500mg  PO daily x1d and then 250mg  daily x4 days 04/24/24  Yes Alyiah Ulloa E, PA-C  TYLENOL  500 MG tablet Take 500 mg by mouth every 6 (six) hours as needed for mild pain or headache.    [provider]    Family History Family History  Problem Relation Age of Onset   Hypertension Mother      Social History Social History   Tobacco Use   Smoking status: Some Days    Current packs/day: 0.00    Average packs/day: 0.5 packs/day for 15.0 years (7.5 ttl pk-yrs)    Types: Cigarettes    Start date: 03/27/2001    Last attempt to quit: 03/27/2016    Years since quitting: 8.0   Smokeless tobacco: Never  Vaping Use   Vaping status: Never Used  Substance Use Topics   Alcohol use: Yes    Comment: occasional now   Drug use: Not Currently     Allergies   Doxorubicin  hcl liposomal   Review of Systems Review of Systems  Constitutional:  Negative for chills and fever.  HENT:  Negative for congestion, ear pain, rhinorrhea, sinus pressure, sinus pain and sore throat.   Respiratory:  Positive for cough, chest tightness, shortness of breath and wheezing.      Physical Exam Triage Vital Signs ED Triage Vitals  Encounter Vitals Group     BP 04/24/24 1438 (!) 156/104     Systolic BP Percentile --      Diastolic BP Percentile --      Pulse Rate 04/24/24 1438 (!) 105     Resp 04/24/24 1438 16     Temp 04/24/24 1438 98.4 F (36.9 C)     Temp Source 04/24/24 1438 Oral     SpO2 04/24/24 1438 92 %     Weight --      Height --      Head Circumference --      Peak Flow --      Pain Score 04/24/24 1439 0     Pain Loc --      Pain Education --      Exclude from Growth Chart --    No data found.  Updated Vital Signs BP (!) 156/104 (BP Location: Left Arm)   Pulse (!) 105   Temp 98.4 F (36.9 C) (Oral)   Resp 16   LMP 03/15/2024   SpO2 97%   Visual Acuity Right Eye Distance:   Left Eye Distance:   Bilateral Distance:    Right Eye Near:   Left Eye Near:    Bilateral Near:     Physical Exam Vitals reviewed.  Constitutional:      General: She is awake.     Appearance: Normal appearance. She is well-developed and well-groomed.  HENT:     Head: Normocephalic and atraumatic.  Cardiovascular:     Rate and Rhythm: Normal rate and regular rhythm.     Pulses: No  decreased pulses.     Heart sounds: Normal heart sounds. No murmur heard.    No friction rub. No gallop.  Pulmonary:     Effort: Pulmonary effort is normal.     Breath sounds: Decreased air movement present. Wheezing and rhonchi present.  Musculoskeletal:     Cervical back: Normal range of motion.  Skin:    General: Skin is warm and dry.  Neurological:     General: No focal deficit present.     Mental Status: She is alert and oriented to person, place, and time.     GCS: GCS eye subscore is 4. GCS verbal subscore is 5. GCS motor subscore is 6.     Cranial Nerves: No cranial nerve deficit, dysarthria or facial asymmetry.  Psychiatric:        Attention and Perception: Attention and perception normal.        Mood and Affect: Mood and affect normal.        Speech: Speech normal.        Behavior: Behavior normal. Behavior is cooperative.      UC Treatments / Results  Labs (all labs ordered are listed, but only abnormal results are displayed) Labs Reviewed - No data to display  EKG   Radiology DG Chest 2 View Result Date: 04/24/2024 CLINICAL DATA:  Wheezing. EXAM: CHEST - 2 VIEW COMPARISON:  Chest radiograph dated 09/09/2023. FINDINGS: Left-sided Port-A-Cath in similar position. No interval change in bilateral pulmonary and perihilar densities since the prior radiograph. No large pleural effusion. No pneumothorax. Stable cardiomegaly. No acute osseous pathology. IMPRESSION: 1. No interval change in bilateral pulmonary and perihilar densities since the prior radiograph. 2. Stable cardiomegaly. Electronically Signed   By: Angus Bark M.D.   On: 04/24/2024 16:40    Procedures Procedures (including critical care time)  Medications Ordered in UC Medications  ipratropium-albuterol  (DUONEB) 0.5-2.5 (3) MG/3ML nebulizer solution 3 mL (3 mLs Nebulization Given 04/24/24 1600)    Initial Impression / Assessment and Plan / UC Course  I have reviewed the triage vital signs and the  nursing notes.  Pertinent labs & imaging results that were available during my care of the patient were reviewed by me and considered in my medical decision making (see chart for details).      Final Clinical Impressions(s) / UC Diagnoses   Final diagnoses:  SOB (shortness of breath)  Wheezing  Acute bronchitis, unspecified organism  Mild intermittent asthma with acute exacerbation   Patient presents today with concerns for shortness of breath, coughing, chest tightness and wheezing.  She has previous known history of asthma as well as Hodgkin's lymphoma with extensive lung involvement.  Physical exam is notable for severe wheezing globally as well as mildly decreased air movement.  Initial vitals show reduced oxygen saturation at 92%.  DuoNeb treatment administered in clinic.  Reassessment demonstrates mild to moderately improved lung sounds with oxygen saturation at 96 to 97%.  Chest x-ray does not show signs of acute cardiopulmonary disease.  At this time suspect potential asthma exacerbation due to recent viral illness, potential bronchitis.  Will send patient home with albuterol  inhaler refill as well as Z-Pak to assist with symptoms.  ED return precautions reviewed and provided in after visit summary.  Recommend close follow-up with PCP and treatment team for ongoing monitoring.  Patient voiced agreement understanding.  Follow-up as indicated.    Discharge Instructions      You were seen today for concerns of shortness of breath, coughing and wheezing.  At this time you do still have significant wheezing on exam.  Your x-ray did not show signs of an acute illness such as pneumonia but your shortness of breath and low oxygen saturation when you came in are concerning.  We administered a DuoNeb treatment which seemed to improve your oxygen saturation and help with your breathing.  I have sent in a refill of your albuterol  inhaler for you to use as needed to assist with shortness of breath  and coughing.  I am also sending in a prescription for medication called azithromycin .  This is also called a Z-Pak and helps provide bacterial coverage and reduce pulmonary inflammation.  Please make sure that you are following up with your primary care provider for ongoing management and monitoring.  If at any point you start to have more severe shortness of breath, difficulty breathing, chest pain, fever or chills that are not responding to medication please go to the emergency room as these could be signs of a medical emergency.     ED Prescriptions     Medication Sig Dispense Auth. Provider   albuterol  (VENTOLIN  HFA) 108 (90 Base) MCG/ACT inhaler Inhale 1-2 puffs into the lungs every 6 (six) hours as needed for wheezing or shortness of breath. 8 g Libni Fusaro E, PA-C   azithromycin  (ZITHROMAX ) 250 MG tablet Take 500mg  PO daily x1d and then 250mg  daily x4 days 6 each Janace Decker E, PA-C      PDMP not reviewed this encounter.   Phillippa Straub, Pearla Bottom, PA-C 04/24/24 1725    Jaquayla Hege, Pearla Bottom, PA-C 04/24/24 1726

## 2024-04-25 ENCOUNTER — Ambulatory Visit (HOSPITAL_COMMUNITY): Payer: Self-pay

## 2024-06-04 ENCOUNTER — Other Ambulatory Visit: Payer: Self-pay

## 2024-08-10 ENCOUNTER — Other Ambulatory Visit: Payer: Self-pay

## 2024-08-10 DIAGNOSIS — C8198 Hodgkin lymphoma, unspecified, lymph nodes of multiple sites: Secondary | ICD-10-CM

## 2024-08-13 ENCOUNTER — Inpatient Hospital Stay (HOSPITAL_BASED_OUTPATIENT_CLINIC_OR_DEPARTMENT_OTHER): Admitting: Hematology

## 2024-08-13 ENCOUNTER — Inpatient Hospital Stay: Attending: Hematology

## 2024-08-13 ENCOUNTER — Inpatient Hospital Stay

## 2024-08-13 VITALS — BP 157/92 | HR 80 | Temp 97.3°F | Resp 20 | Wt 140.8 lb

## 2024-08-13 DIAGNOSIS — C8198 Hodgkin lymphoma, unspecified, lymph nodes of multiple sites: Secondary | ICD-10-CM | POA: Diagnosis not present

## 2024-08-13 DIAGNOSIS — C8120 Mixed cellularity classical Hodgkin lymphoma, unspecified site: Secondary | ICD-10-CM | POA: Diagnosis present

## 2024-08-13 DIAGNOSIS — F17210 Nicotine dependence, cigarettes, uncomplicated: Secondary | ICD-10-CM | POA: Diagnosis not present

## 2024-08-13 DIAGNOSIS — R634 Abnormal weight loss: Secondary | ICD-10-CM | POA: Diagnosis not present

## 2024-08-13 DIAGNOSIS — R509 Fever, unspecified: Secondary | ICD-10-CM | POA: Insufficient documentation

## 2024-08-13 DIAGNOSIS — Z91199 Patient's noncompliance with other medical treatment and regimen due to unspecified reason: Secondary | ICD-10-CM | POA: Insufficient documentation

## 2024-08-13 DIAGNOSIS — Z7189 Other specified counseling: Secondary | ICD-10-CM

## 2024-08-13 DIAGNOSIS — R61 Generalized hyperhidrosis: Secondary | ICD-10-CM | POA: Diagnosis not present

## 2024-08-13 DIAGNOSIS — R918 Other nonspecific abnormal finding of lung field: Secondary | ICD-10-CM | POA: Diagnosis not present

## 2024-08-13 DIAGNOSIS — Z79899 Other long term (current) drug therapy: Secondary | ICD-10-CM | POA: Insufficient documentation

## 2024-08-13 DIAGNOSIS — Z95828 Presence of other vascular implants and grafts: Secondary | ICD-10-CM

## 2024-08-13 LAB — CMP (CANCER CENTER ONLY)
ALT: 23 U/L (ref 0–44)
AST: 35 U/L (ref 15–41)
Albumin: 4.8 g/dL (ref 3.5–5.0)
Alkaline Phosphatase: 67 U/L (ref 38–126)
Anion gap: 9 (ref 5–15)
BUN: 13 mg/dL (ref 6–20)
CO2: 28 mmol/L (ref 22–32)
Calcium: 9.4 mg/dL (ref 8.9–10.3)
Chloride: 98 mmol/L (ref 98–111)
Creatinine: 0.86 mg/dL (ref 0.44–1.00)
GFR, Estimated: >60 mL/min (ref >60)
Glucose, Bld: 85 mg/dL (ref 70–99)
Potassium: 3.9 mmol/L (ref 3.5–5.1)
Sodium: 135 mmol/L (ref 135–145)
Total Bilirubin: 1.3 mg/dL — ABNORMAL HIGH (ref 0.0–1.2)
Total Protein: 7.3 g/dL (ref 6.5–8.1)

## 2024-08-13 LAB — CBC WITH DIFFERENTIAL (CANCER CENTER ONLY)
Abs Immature Granulocytes: 0 K/uL (ref 0.00–0.07)
Basophils Absolute: 0 K/uL (ref 0.0–0.1)
Basophils Relative: 0 %
Eosinophils Absolute: 0 K/uL (ref 0.0–0.5)
Eosinophils Relative: 1 %
HCT: 36.5 % (ref 36.0–46.0)
Hemoglobin: 13.1 g/dL (ref 12.0–15.0)
Immature Granulocytes: 0 %
Lymphocytes Relative: 10 %
Lymphs Abs: 0.3 K/uL — ABNORMAL LOW (ref 0.7–4.0)
MCH: 33.7 pg (ref 26.0–34.0)
MCHC: 35.9 g/dL (ref 30.0–36.0)
MCV: 93.8 fL (ref 80.0–100.0)
Monocytes Absolute: 0.6 K/uL (ref 0.1–1.0)
Monocytes Relative: 17 %
Neutro Abs: 2.3 K/uL (ref 1.7–7.7)
Neutrophils Relative %: 72 %
Platelet Count: 197 K/uL (ref 150–400)
RBC: 3.89 MIL/uL (ref 3.87–5.11)
RDW: 11.9 % (ref 11.5–15.5)
WBC Count: 3.2 K/uL — ABNORMAL LOW (ref 4.0–10.5)
nRBC: 0 % (ref 0.0–0.2)

## 2024-08-13 MED ORDER — SODIUM CHLORIDE 0.9% FLUSH
10.0000 mL | INTRAVENOUS | Status: DC | PRN
  Administered 2024-08-13: 10 mL

## 2024-08-15 ENCOUNTER — Other Ambulatory Visit: Payer: Self-pay

## 2024-08-15 ENCOUNTER — Encounter

## 2024-08-19 ENCOUNTER — Encounter: Payer: Self-pay | Admitting: Hematology

## 2024-08-19 NOTE — Progress Notes (Signed)
 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 She was noted to have significant constitutional symptoms including significant weight loss, fevers chills and some night sweats.  Current Treatment:  Lost to f/u for about 2 yrs  Previous treatment  5 cycles of AVD (without bleomycin due to lung involvement and DLCO of 37%, active smoker) Multiple avoidable treatment delays due to the patient's noncompliance with follow-up for avoidable reasons. She has been counseled repeatedly that this would increase the likelihood of unfavorable outcome.  2nd line therapy with Bendamustine  + Brentuximab s/p 7 cycles.  3rd line therapy with ICE s/p 2 cycles   Pembrolizumab - 4th line. Patient declined HDT/Auto HSCT or consideration of Car-T cell therapy   INTERVAL HISTORY:  Heidi [name] is a 38 y.o. female here for her classical Hodgkin's lymphoma follow-up. She was last seen in clinic by us  in March 2025. She notes that she has been communication with the transplant team at [facility] and has made a final decision not to proceed with a consolidative autologous bone marrow transplant. She notes that she thought about this in detail and understands the pros and cons of this and has decided to hold off on pursuing this currently. At this time the patient notes no acute new symptoms.  No fevers no chills no night sweats.  No new skin rashes or pruritus.  No new lumps or bumps.  No new shortness of breath or chest pain. No new abdominal pain or distention. No unexpected sudden weight loss. Notes that she is  feeling well overall.  REVIEW OF SYSTEMS:  .10 Point review of Systems was done is negative except as noted above.  Past Medical History:  Diagnosis Date   ARDS (adult respiratory distress syndrome) (HCC)    Asthma    Hodgkin lymphoma (HCC)    Hypertension     . Past Surgical History:  Procedure Laterality Date   AXILLARY LYMPH NODE BIOPSY Right 03/19/2016   Procedure: AXILLARY LYMPH NODE BIOPSY;  Surgeon: Krystal Spinner, MD;  [facility];  Service: General;  Laterality: Right;   IR GENERIC HISTORICAL  12/22/2016   IR FLUORO GUIDE PORT INSERTION LEFT 12/22/2016 [facility]   IR GENERIC HISTORICAL  12/22/2016   IR US  GUIDE VASC ACCESS LEFT 12/22/2016 [facility]   IR GENERIC HISTORICAL  12/22/2016   IR CV LINE INJECTION 12/22/2016 [facility]   IR GENERIC HISTORICAL  12/22/2016   IR REMOVAL TUN ACCESS W/ PORT W/O FL MOD SED 12/22/2016 [facility]   VIDEO BRONCHOSCOPY Bilateral 11/26/2016   Procedure: VIDEO BRONCHOSCOPY WITH FLUORO;  Surgeon: Harden LULLA Staff, MD;  [facility];  Service: Cardiopulmonary;  Laterality: Bilateral;    . Social History   Tobacco Use   Smoking status: Some Days    Current packs/day: 0.00    Average packs/day: 0.5 packs/day for 15.0 years (7.5 ttl pk-yrs)    Types: Cigarettes    Start date: 03/27/2001    Last attempt to quit: 03/27/2016    Years since quitting: 8.4   Smokeless tobacco: Never  Vaping Use   Vaping status: Never Used  Substance Use Topics   Alcohol use: Yes    Comment:  occasional now   Drug use: Not Currently    ALLERGIES:  is allergic to doxorubicin  hcl liposomal.  MEDICATIONS:  Current Outpatient Medications  Medication Sig Dispense Refill   albuterol  (VENTOLIN  HFA) 108 (90 Base) MCG/ACT inhaler Inhale 1-2 puffs into the lungs every 6 (six) hours as needed for wheezing or shortness of breath. 8 g 0   TYLENOL  500 MG tablet Take 500 mg by mouth every 6 (six) hours as needed for mild pain or headache.      azithromycin  (ZITHROMAX ) 250 MG tablet Take 500mg  PO daily x1d and then 250mg  daily x4 days (Patient not taking: Reported on 08/13/2024) 6 each 0   No current facility-administered medications for this visit.   Facility-Administered Medications Ordered in Other Visits  Medication Dose Route Frequency Provider Last Rate Last Admin   sodium chloride  flush (NS) 0.9 % injection 10 mL  10 mL Intracatheter PRN Matricia Begnaud Kishore, MD        PHYSICAL EXAMINATION:  .BP (!) 157/92 Comment: re-check  Pulse 80   Temp (!) 97.3 F (36.3 C)   Resp 20   Wt 140 lb 12.8 oz (63.9 kg)   SpO2 95%   BMI 22.05 kg/m  . GENERAL:alert, in no acute distress and comfortable SKIN: no acute rashes, no significant lesions EYES: conjunctiva are pink and non-injected, sclera anicteric OROPHARYNX: MMM, no exudates, no oropharyngeal erythema or ulceration NECK: supple, no JVD LYMPH:  no palpable lymphadenopathy in the cervical, axillary or inguinal regions LUNGS: clear to auscultation b/l with normal respiratory effort HEART: regular rate & rhythm ABDOMEN:  normoactive bowel sounds , non tender, not distended.  Palpable hepatosplenomegaly Extremity: no pedal edema PSYCH: alert & oriented x 3 with fluent speech NEURO: no focal motor/sensory deficits   LABORATORY DATA:   I have reviewed the data as listed     Latest Ref Rng & Units 08/13/2024    8:59 AM 03/02/2024    2:15 PM 01/02/2024    2:15 PM  CBC  WBC 4.0 - 10.5 K/uL 3.2  3.5  7.3   Hemoglobin 12.0 - 15.0 g/dL 86.8  87.3  87.5   Hematocrit 36.0 - 46.0 % 36.5  36.7  35.6   Platelets 150 - 400 K/uL 197  211  299    . CBC    Component Value Date/Time   WBC 3.2 (L) 08/13/2024 0859   WBC 26.7 (H) 09/10/2023 0550   RBC 3.89 08/13/2024 0859   HGB 13.1 08/13/2024 0859   HGB 11.7 07/20/2017 1315   HCT 36.5 08/13/2024 0859   HCT 35.2 07/20/2017 1315   PLT 197 08/13/2024 0859   PLT 266 07/20/2017 1315   MCV 93.8 08/13/2024 0859   MCV 93.2 07/20/2017  1315   MCH 33.7 08/13/2024 0859   MCHC 35.9 08/13/2024 0859   RDW 11.9 08/13/2024 0859   RDW 14.5 07/20/2017 1315   LYMPHSABS 0.3 (L) 08/13/2024 0859   LYMPHSABS 0.3 (L) 07/20/2017 1315   MONOABS 0.6 08/13/2024 0859   MONOABS 0.5 07/20/2017 1315   EOSABS 0.0 08/13/2024 0859   EOSABS 0.2 07/20/2017 1315   BASOSABS 0.0 08/13/2024 0859   BASOSABS 0.0 07/20/2017 1315   .    Latest Ref Rng & Units 08/13/2024    8:59 AM 03/02/2024    2:15 PM 01/02/2024    2:15 PM  CMP  Glucose 70 - 99 mg/dL 85  65  75   BUN 6 - 20 mg/dL 13  9  10  Creatinine 0.44 - 1.00 mg/dL 9.13  9.24  9.24   Sodium 135 - 145 mmol/L 135  136  133   Potassium 3.5 - 5.1 mmol/L 3.9  4.2  4.1   Chloride 98 - 111 mmol/L 98  99  97   CO2 22 - 32 mmol/L 28  27  23    Calcium  8.9 - 10.3 mg/dL 9.4  9.1  8.9   Total Protein 6.5 - 8.1 g/dL 7.3  7.2  7.6   Total Bilirubin 0.0 - 1.2 mg/dL 1.3  0.6  0.4   Alkaline Phos 38 - 126 U/L 67  89  113   AST 15 - 41 U/L 35  27  34   ALT 0 - 44 U/L 23  19  15       RADIOGRAPHIC STUDIES: I have personally reviewed the radiological images as listed and agreed with the findings in the report. No results found.  ASSESSMENT & PLAN:   38 y.o. 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  #2 s/p hypoxic respiratory failure with dense right lung consolidation and left upper lobe consolidation with SVC syndrome. Patient has completed palliative radiation to the right lung mass causing SVC compression.  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   #4 Non compliance with clinic and treatment followup. Missed 2nd dose of bendamustine  with C2. Has missed  multiple appointment for her PET/CT and missed appointment at [facility] for consideration of Transplant.  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 Patient has been off of her last line of treatments with GVD-P last received on 08/25/2023. Patient had a PET/CT on 09/22/2023 which showed that she was in remission and was referred for consideration of autologous hematopoietic stem cell transplant but has declined this option. - We will repeat a PET CT scan to evaluate for his current status of her classical Hodgkin's lymphoma. - PET/CT scan in 3 weeks we shall see her back in 4 weeks.  FOLLOW-UP:  Pet/ct in 3 weeks RTC with Dr Heidi in 4 weeks   The total time spent in the appointment was 21 minutes*.  All of the patient's questions were answered with apparent satisfaction. The patient knows to call the clinic with any problems, questions or concerns.   Emaline Onesimo MD MS AAHIVMS [facility] Hematology/Oncology Physician [facility]  .*Total Encounter Time as defined by the Centers for Medicare and Medicaid Services includes, in addition to the face-to-face time of a patient visit (documented in the note above) non-face-to-face time: obtaining and reviewing outside history, ordering and reviewing medications, tests or procedures, care coordination (communications with other health care professionals  or caregivers) and documentation in the medical  record.

## 2024-08-24 ENCOUNTER — Encounter (HOSPITAL_COMMUNITY)
Admission: RE | Admit: 2024-08-24 | Discharge: 2024-08-24 | Disposition: A | Discharge status: Home / Self Care | Source: Ambulatory Visit | Attending: Hematology

## 2024-08-24 DIAGNOSIS — C8198 Hodgkin lymphoma, unspecified, lymph nodes of multiple sites: Secondary | ICD-10-CM | POA: Insufficient documentation

## 2024-08-24 LAB — GLUCOSE, CAPILLARY: Glucose-Capillary: 80 mg/dL (ref 70–99)

## 2024-08-24 MED ORDER — FLUDEOXYGLUCOSE F - 18 (FDG) INJECTION
6.8800 | Freq: Once | INTRAVENOUS | Status: AC
  Administered 2024-08-24: 6.88 via INTRAVENOUS

## 2024-09-17 ENCOUNTER — Other Ambulatory Visit: Payer: Self-pay

## 2024-09-17 ENCOUNTER — Encounter: Payer: Self-pay | Admitting: Hematology

## 2024-09-17 DIAGNOSIS — C8198 Hodgkin lymphoma, unspecified, lymph nodes of multiple sites: Secondary | ICD-10-CM

## 2024-09-18 ENCOUNTER — Inpatient Hospital Stay: Attending: Hematology

## 2024-09-18 ENCOUNTER — Inpatient Hospital Stay: Admitting: Hematology

## 2024-09-18 VITALS — BP 138/90 | HR 70 | Temp 97.2°F | Resp 20 | Wt 143.4 lb

## 2024-09-18 DIAGNOSIS — C8120 Mixed cellularity classical Hodgkin lymphoma, unspecified site: Secondary | ICD-10-CM | POA: Insufficient documentation

## 2024-09-18 DIAGNOSIS — C8198 Hodgkin lymphoma, unspecified, lymph nodes of multiple sites: Secondary | ICD-10-CM

## 2024-09-18 DIAGNOSIS — C8128 Mixed cellularity classical Hodgkin lymphoma, lymph nodes of multiple sites: Secondary | ICD-10-CM

## 2024-09-18 DIAGNOSIS — F17210 Nicotine dependence, cigarettes, uncomplicated: Secondary | ICD-10-CM | POA: Insufficient documentation

## 2024-09-18 LAB — CMP (CANCER CENTER ONLY)
ALT: 26 U/L (ref 0–44)
AST: 30 U/L (ref 15–41)
Albumin: 4.1 g/dL (ref 3.5–5.0)
Alkaline Phosphatase: 76 U/L (ref 38–126)
Anion gap: 6 (ref 5–15)
BUN: 10 mg/dL (ref 6–20)
CO2: 29 mmol/L (ref 22–32)
Calcium: 8.8 mg/dL — ABNORMAL LOW (ref 8.9–10.3)
Chloride: 100 mmol/L (ref 98–111)
Creatinine: 0.84 mg/dL (ref 0.44–1.00)
GFR, Estimated: >60 mL/min (ref >60)
Glucose, Bld: 148 mg/dL — ABNORMAL HIGH (ref 70–99)
Potassium: 3.9 mmol/L (ref 3.5–5.1)
Sodium: 135 mmol/L (ref 135–145)
Total Bilirubin: 0.9 mg/dL (ref 0.0–1.2)
Total Protein: 6.6 g/dL (ref 6.5–8.1)

## 2024-09-18 LAB — CBC WITH DIFFERENTIAL (CANCER CENTER ONLY)
Abs Immature Granulocytes: 0.01 K/uL (ref 0.00–0.07)
Basophils Absolute: 0 K/uL (ref 0.0–0.1)
Basophils Relative: 1 %
Eosinophils Absolute: 0.1 K/uL (ref 0.0–0.5)
Eosinophils Relative: 3 %
HCT: 35.6 % — ABNORMAL LOW (ref 36.0–46.0)
Hemoglobin: 12.3 g/dL (ref 12.0–15.0)
Immature Granulocytes: 0 %
Lymphocytes Relative: 7 %
Lymphs Abs: 0.3 K/uL — ABNORMAL LOW (ref 0.7–4.0)
MCH: 33.3 pg (ref 26.0–34.0)
MCHC: 34.6 g/dL (ref 30.0–36.0)
MCV: 96.5 fL (ref 80.0–100.0)
Monocytes Absolute: 0.7 K/uL (ref 0.1–1.0)
Monocytes Relative: 19 %
Neutro Abs: 2.5 K/uL (ref 1.7–7.7)
Neutrophils Relative %: 70 %
Platelet Count: 261 K/uL (ref 150–400)
RBC: 3.69 MIL/uL — ABNORMAL LOW (ref 3.87–5.11)
RDW: 12.6 % (ref 11.5–15.5)
WBC Count: 3.5 K/uL — ABNORMAL LOW (ref 4.0–10.5)
nRBC: 0 % (ref 0.0–0.2)

## 2024-09-18 MED ORDER — NICOTINE POLACRILEX 2 MG MT GUM: 2.0000 mg | CHEWING_GUM | OROMUCOSAL | 1 refills | Status: DC | PRN

## 2024-09-24 ENCOUNTER — Encounter: Payer: Self-pay | Admitting: Hematology

## 2024-09-24 NOTE — Progress Notes (Signed)
 HEMATOLOGY/ONCOLOGY CLINIC VISIT NOTE  Date of service: .09/18/2024  Patient Care Team: Pcp, No as PCP - General Onesimo [name], MD as Consulting Physician (Hematology)  Chief complaint: Follow-up for Hodgkin's lymphoma  Diagnosis:   Refractory Mixed cellularity Hodgkin's lymphoma IVBE with extensive lymphadenopathy including right axillary, mediastinal and upper retroperitoneal and now biopsy proven pulmonary involvement. She was noted to have significant constitutional symptoms including significant weight loss, fevers chills and some night sweats.  Current Treatment:  Lost to f/u for about 2 yrs  Previous treatment  5 cycles of AVD (without bleomycin due to lung involvement and DLCO of 37%, active smoker) Multiple avoidable treatment delays due to the patient's noncompliance with follow-up for avoidable reasons. She has been counseled repeatedly that this would increase the likelihood of unfavorable outcome.  2nd line therapy with Bendamustine  + Brentuximab s/p 7 cycles.  3rd line therapy with ICE s/p 2 cycles   Pembrolizumab - 4th line. Patient declined HDT/Auto HSCT or consideration of Car-T cell therapy   INTERVAL HISTORY:  Heidi [name] is a 38 y.o. female who is here for continued valuation and management of her classical Hodgkin's lymphoma. She notes no acute new symptoms. No new lumps or bumps.  No fevers no chills no night sweats. Her PET/CT scan results were discussed with her in details. Still smoking a few cigarettes a day.  REVIEW OF SYSTEMS:  10 Point review of Systems was done is negative except as noted above.  Past Medical History:  Diagnosis Date   ARDS (adult respiratory distress syndrome) (HCC)    Asthma    Hodgkin lymphoma (HCC)    Hypertension     . Past Surgical History:  Procedure Laterality Date   AXILLARY LYMPH NODE BIOPSY Right 03/19/2016   Procedure: AXILLARY LYMPH NODE BIOPSY;  Surgeon: Krystal Spinner, MD;  Location: [facility];   Service: General;  Laterality: Right;   IR GENERIC HISTORICAL  12/22/2016   IR FLUORO GUIDE PORT INSERTION LEFT 12/22/2016 [facility]   IR GENERIC HISTORICAL  12/22/2016   IR US  GUIDE VASC ACCESS LEFT 12/22/2016 [facility]   IR GENERIC HISTORICAL  12/22/2016   IR CV LINE INJECTION 12/22/2016 [facility]   IR GENERIC HISTORICAL  12/22/2016   IR REMOVAL TUN ACCESS W/ PORT W/O FL MOD SED 12/22/2016 [facility]   VIDEO BRONCHOSCOPY Bilateral 11/26/2016   Procedure: VIDEO BRONCHOSCOPY WITH FLUORO;  Surgeon: Harden LULLA Staff, MD;  Location: [facility];  Service: Cardiopulmonary;  Laterality: Bilateral;    . Social History   Tobacco Use   Smoking status: Some Days    Current packs/day: 0.00    Average packs/day: 0.5 packs/day for 15.0 years (7.5 ttl pk-yrs)    Types: Cigarettes    Start date: 03/27/2001    Last attempt to quit: 03/27/2016    Years since quitting: 8.5   Smokeless tobacco: Never  Vaping Use   Vaping status: Never Used  Substance Use Topics   Alcohol use: Yes    Comment: occasional now   Drug use: Not Currently    ALLERGIES:  is allergic to doxorubicin  hcl liposomal.  MEDICATIONS:  Current Outpatient Medications  Medication Sig Dispense Refill   nicotine  polacrilex (NICORETTE ) 2 MG gum Take 1 each (2 mg total) by mouth as needed for smoking cessation. 100 tablet 1   albuterol  (VENTOLIN  HFA) 108 (90 Base) MCG/ACT inhaler Inhale 1-2 puffs into the lungs every 6 (six) hours as needed for wheezing or shortness of breath. 8 g 0  TYLENOL  500 MG tablet Take 500 mg by mouth every 6 (six) hours as needed for mild pain or headache.     No current facility-administered medications for this visit.   Facility-Administered Medications Ordered in Other Visits  Medication Dose Route Frequency Provider Last Rate Last Admin   sodium chloride  flush (NS) 0.9 % injection 10 mL  10 mL Intracatheter PRN Onesimo [name], MD        PHYSICAL EXAMINATION:  .BP (!) 138/90    Pulse 70   Temp (!) 97.2 F (36.2 C)   Resp 20   Wt 143 lb 6.4 oz (65 kg)   LMP 08/21/2024 (Exact Date)   SpO2 95%   BMI 22.46 kg/m  .  NAD GENERAL:alert, in no acute distress and comfortable SKIN: no acute rashes, no significant lesions EYES: conjunctiva are pink and non-injected, sclera anicteric OROPHARYNX: MMM, no exudates, no oropharyngeal erythema or ulceration NECK: supple, no JVD LYMPH:  no palpable lymphadenopathy in the cervical, axillary or inguinal regions LUNGS: clear to auscultation b/l with normal respiratory effort HEART: regular rate & rhythm ABDOMEN:  normoactive bowel sounds , non tender, not distended. Extremity: no pedal edema PSYCH: alert & oriented x 3 with fluent speech NEURO: no focal motor/sensory deficits    LABORATORY DATA:   I have reviewed the data as listed     Latest Ref Rng & Units 09/18/2024   11:13 AM 08/13/2024    8:59 AM 03/02/2024    2:15 PM  CBC  WBC 4.0 - 10.5 K/uL 3.5  3.2  3.5   Hemoglobin 12.0 - 15.0 g/dL 87.6  86.8  87.3   Hematocrit 36.0 - 46.0 % 35.6  36.5  36.7   Platelets 150 - 400 K/uL 261  197  211    . CBC    Component Value Date/Time   WBC 3.5 (L) 09/18/2024 1113   WBC 26.7 (H) 09/10/2023 0550   RBC 3.69 (L) 09/18/2024 1113   HGB 12.3 09/18/2024 1113   HGB 11.7 07/20/2017 1315   HCT 35.6 (L) 09/18/2024 1113   HCT 35.2 07/20/2017 1315   PLT 261 09/18/2024 1113   PLT 266 07/20/2017 1315   MCV 96.5 09/18/2024 1113   MCV 93.2 07/20/2017 1315   MCH 33.3 09/18/2024 1113   MCHC 34.6 09/18/2024 1113   RDW 12.6 09/18/2024 1113   RDW 14.5 07/20/2017 1315   LYMPHSABS 0.3 (L) 09/18/2024 1113   LYMPHSABS 0.3 (L) 07/20/2017 1315   MONOABS 0.7 09/18/2024 1113   MONOABS 0.5 07/20/2017 1315   EOSABS 0.1 09/18/2024 1113   EOSABS 0.2 07/20/2017 1315   BASOSABS 0.0 09/18/2024 1113   BASOSABS 0.0 07/20/2017 1315   .    Latest Ref Rng & Units 09/18/2024   11:13 AM 08/13/2024    8:59 AM 03/02/2024    2:15 PM  CMP  Glucose  70 - 99 mg/dL 851  85  65   BUN 6 - 20 mg/dL 10  13  9    Creatinine 0.44 - 1.00 mg/dL 9.15  9.13  9.24   Sodium 135 - 145 mmol/L 135  135  136   Potassium 3.5 - 5.1 mmol/L 3.9  3.9  4.2   Chloride 98 - 111 mmol/L 100  98  99   CO2 22 - 32 mmol/L 29  28  27    Calcium  8.9 - 10.3 mg/dL 8.8  9.4  9.1   Total Protein 6.5 - 8.1 g/dL 6.6  7.3  7.2  Total Bilirubin 0.0 - 1.2 mg/dL 0.9  1.3  0.6   Alkaline Phos 38 - 126 U/L 76  67  89   AST 15 - 41 U/L 30  35  27   ALT 0 - 44 U/L 26  23  19       RADIOGRAPHIC STUDIES: I have personally reviewed the radiological images as listed and agreed with the findings in the report.  NM PET Image Restag (PS) Skull Base To Thigh Result Date: 08/31/2024 CLINICAL DATA:  Subsequent treatment strategy for Hodgkin lymphoma. EXAM: NUCLEAR MEDICINE PET SKULL BASE TO THIGH TECHNIQUE: 6.9 mCi F-18 FDG was injected intravenously. Full-ring PET imaging was performed from the skull base to thigh after the radiotracer. CT data was obtained and used for attenuation correction and anatomic localization. Fasting blood glucose: 80 mg/dl COMPARISON:  90/73/7975. FINDINGS: Mediastinal blood pool activity: SUV max 2.7 Liver activity: SUV max 2.9 NECK: No abnormal hypermetabolism. Incidental CT findings: None. CHEST: No abnormal hypermetabolism. Incidental CT findings: Left IJ Port-A-Cath terminates in the low SVC. Right atrial wall calcification. Enlarged pulmonic trunk and heart. Trace pericardial fluid. Patchy scarring and volume loss in perihilar right hemithorax. Vague patchy ground-glass scarring in the lingula. ABDOMEN/PELVIS: No abnormal hypermetabolism. Incidental CT findings: None. SKELETON: Patchy sclerosis throughout the visualized osseous structures without associated abnormal hypermetabolism. Incidental CT findings: None. IMPRESSION: 1. No evidence of recurrent lymphoma ([place] 1). 2. Quiescent osseous metastatic disease. 3. Trace pericardial fluid. 4. Electronically  Signed   By: Newell Eke M.D.   On: 08/31/2024 09:33     ASSESSMENT & PLAN:   38 y.o. 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  #2 s/p hypoxic respiratory failure with dense right lung consolidation and left upper lobe consolidation with SVC syndrome. Patient has completed palliative radiation to the right lung mass causing SVC compression.  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   #4 Non compliance with clinic and treatment followup. Missed 2nd dose of bendamustine  with C2. Has missed multiple appointment for her PET/CT and missed appointment at [facility] for consideration of Transplant.  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 Patient has declined any consolidative transplant or cellular therapies at this time. We discussed that in the absence of active disease and we will continue to monitor her Hodgkin lymphoma of all treatments at this time symptoms. Patient is glad with her PET scan result and is thankful for all the care of this for. Counseled on smoking cessation.   Patient is agreeable with nicotine  lozenges or gum to use as needed.  Prescribed.  FOLLOW-UP: Return to clinic with Dr. Onesimo with labs in 6  months  The total time spent in the appointment was 25 minutes*.  All of the patient's questions were answered with apparent satisfaction. The patient knows to call the clinic with any problems, questions or concerns.   Emaline Saran MD MS AAHIVMS [facility] Hematology/Oncology Physician [facility]  .*Total Encounter Time as defined by the Centers for Medicare and Medicaid Services includes, in addition to the face-to-face time of a patient visit (documented in the note above) non-face-to-face time: obtaining and reviewing outside history, ordering and reviewing medications, tests or procedures, care coordination (communications with other health care professionals or caregivers) and documentation in the medical record.

## 2024-10-16 ENCOUNTER — Telehealth: Payer: Self-pay | Admitting: Hematology

## 2024-10-16 NOTE — Telephone Encounter (Signed)
 Scheduled patient for next appointments. Called and spoke with the patient, she is aware.

## 2024-12-11 ENCOUNTER — Other Ambulatory Visit: Payer: Self-pay

## 2024-12-11 ENCOUNTER — Emergency Department (HOSPITAL_COMMUNITY)

## 2024-12-11 ENCOUNTER — Emergency Department (HOSPITAL_COMMUNITY): Admission: EM | Admit: 2024-12-11 | Discharge: 2024-12-12

## 2024-12-11 ENCOUNTER — Encounter (HOSPITAL_COMMUNITY): Payer: Self-pay

## 2024-12-11 DIAGNOSIS — R0602 Shortness of breath: Secondary | ICD-10-CM | POA: Diagnosis present

## 2024-12-11 DIAGNOSIS — R059 Cough, unspecified: Secondary | ICD-10-CM | POA: Diagnosis not present

## 2024-12-11 DIAGNOSIS — Z5321 Procedure and treatment not carried out due to patient leaving prior to being seen by health care provider: Secondary | ICD-10-CM | POA: Diagnosis not present

## 2024-12-11 DIAGNOSIS — R0981 Nasal congestion: Secondary | ICD-10-CM | POA: Diagnosis not present

## 2024-12-11 LAB — CBC
HCT: 38.2 % (ref 36.0–46.0)
Hemoglobin: 12.8 g/dL (ref 12.0–15.0)
MCH: 32.7 pg (ref 26.0–34.0)
MCHC: 33.5 g/dL (ref 30.0–36.0)
MCV: 97.7 fL (ref 80.0–100.0)
Platelets: 225 K/uL (ref 150–400)
RBC: 3.91 MIL/uL (ref 3.87–5.11)
RDW: 13.7 % (ref 11.5–15.5)
WBC: 3.8 K/uL — ABNORMAL LOW (ref 4.0–10.5)
nRBC: 0 % (ref 0.0–0.2)

## 2024-12-11 LAB — BASIC METABOLIC PANEL WITH GFR
Anion gap: 11 (ref 5–15)
BUN: 9 mg/dL (ref 6–20)
CO2: 25 mmol/L (ref 22–32)
Calcium: 9.1 mg/dL (ref 8.9–10.3)
Chloride: 99 mmol/L (ref 98–111)
Creatinine, Ser: 0.77 mg/dL (ref 0.44–1.00)
GFR, Estimated: >60 mL/min (ref >60)
Glucose, Bld: 84 mg/dL (ref 70–99)
Potassium: 4.5 mmol/L (ref 3.5–5.1)
Sodium: 135 mmol/L (ref 135–145)

## 2024-12-11 LAB — HCG, SERUM, QUALITATIVE: Preg, Serum: NEGATIVE

## 2024-12-11 LAB — TROPONIN T, HIGH SENSITIVITY: Troponin T High Sensitivity: <15 ng/L (ref 0–19)

## 2024-12-11 LAB — RESP PANEL BY RT-PCR (RSV, FLU A&B, COVID)  RVPGX2
Influenza A by PCR: NEGATIVE
Influenza B by PCR: NEGATIVE
Resp Syncytial Virus by PCR: NEGATIVE
SARS Coronavirus 2 by RT PCR: NEGATIVE

## 2024-12-11 LAB — D-DIMER, QUANTITATIVE: D-Dimer, Quant: 1.53 ug{FEU}/mL — ABNORMAL HIGH (ref 0.00–0.50)

## 2024-12-11 MED ORDER — IOHEXOL 350 MG/ML SOLN
75.0000 mL | Freq: Once | INTRAVENOUS | Status: AC | PRN
  Administered 2024-12-11: 23:00:00 75 mL via INTRAVENOUS

## 2024-12-11 NOTE — ED Provider Triage Note (Addendum)
 Emergency Medicine Provider Triage Evaluation Note  Heidi [name] , a 38 y.o. female  was evaluated in triage.  Pt complains of shortness of breath/cough.  Patient reports progressively worsening shortness of breath for 1-2 weeks with associated cough.  History of Hodgkin's lymphoma, patient is in remission currently, no history of PE/DVT.  No fever.  Review of Systems  Positive: As above Negative: As above  Physical Exam  BP (!) 154/111   Pulse 82   Temp (!) 97.5 F (36.4 C)   Resp (!) 24   Ht 5' 7 (1.702 m)   Wt 65.3 kg   LMP 11/19/2024 (Approximate)   SpO2 99%   BMI 22.55 kg/m  Gen:   Awake, no distress   Resp:  Mildly tachypneic but speaking in full/clear sentences without evidence of respiratory distress MSK:   Moves extremities without difficulty  Other:    Medical Decision Making  Medically screening exam initiated at 7:11 PM.  Appropriate orders placed.  Allecia RANDALL RAMPERSAD was informed that the remainder of the evaluation will be completed by another provider, this initial triage assessment does not replace that evaluation, and the importance of remaining in the ED until their evaluation is complete.  Patient found to be hypoxic at 86% on room air, placed on 4 L via nasal cannula. 21:23 - D-dimer notably elevated at 1.53, given hypoxia with new oxygen requirement as well as shortness of breath on exertion with history of malignancy I do feel that proceeding with a CTA chest is necessary to evaluate for pulmonary embolism.   Glendia Rocky SAILOR, PA-C 12/11/24 [id number]    Glendia Rocky SAILOR, [place] 12/11/24 [id number]

## 2024-12-11 NOTE — ED Triage Notes (Signed)
 Quick triage note: Pt reports cold symptoms x 2 weeks, progressively getting worse. Reports cough, SHOB, congestion. Minimal relief with OTC meds.

## 2024-12-11 NOTE — ED Notes (Signed)
 This RN notified CT that pt has an IV and is ready for ordered CT scan

## 2024-12-12 NOTE — ED Notes (Addendum)
 Pt desiring to leave. Encouraged her to stay and be seen. She is refusing. PT alert and oriented and appears to be in no obvious distress.

## 2024-12-21 ENCOUNTER — Encounter (HOSPITAL_COMMUNITY): Payer: Self-pay | Admitting: *Deleted

## 2024-12-21 ENCOUNTER — Other Ambulatory Visit: Payer: Self-pay

## 2024-12-21 ENCOUNTER — Ambulatory Visit (HOSPITAL_COMMUNITY)
Admission: EM | Admit: 2024-12-21 | Discharge: 2024-12-21 | Disposition: A | Discharge status: Home / Self Care | Attending: Physician Assistant | Admitting: Physician Assistant

## 2024-12-21 DIAGNOSIS — J4541 Moderate persistent asthma with (acute) exacerbation: Secondary | ICD-10-CM | POA: Diagnosis not present

## 2024-12-21 DIAGNOSIS — R0602 Shortness of breath: Secondary | ICD-10-CM | POA: Diagnosis not present

## 2024-12-21 MED ORDER — PREDNISONE 10 MG (21) PO TBPK: ORAL_TABLET | ORAL | 0 refills | Status: AC

## 2024-12-21 MED ORDER — BUDESONIDE-FORMOTEROL FUMARATE 80-4.5 MCG/ACT IN AERO: 2.0000 | INHALATION_SPRAY | Freq: Two times a day (BID) | RESPIRATORY_TRACT | 0 refills | Status: AC | PRN

## 2024-12-21 MED ORDER — IPRATROPIUM-ALBUTEROL 0.5-2.5 (3) MG/3ML IN SOLN
3.0000 mL | Freq: Once | RESPIRATORY_TRACT | Status: AC
  Administered 2024-12-21: 3 mL via RESPIRATORY_TRACT

## 2024-12-21 MED ORDER — IPRATROPIUM-ALBUTEROL 0.5-2.5 (3) MG/3ML IN SOLN
RESPIRATORY_TRACT | Status: AC
  Filled 2024-12-21: qty 3

## 2024-12-21 MED ORDER — ALBUTEROL SULFATE HFA 108 (90 BASE) MCG/ACT IN AERS: 1.0000 | INHALATION_SPRAY | Freq: Four times a day (QID) | RESPIRATORY_TRACT | 0 refills | Status: AC | PRN

## 2024-12-21 NOTE — ED Triage Notes (Signed)
 PT reports [facility] for over a week. PT went to ED but left before treatment was given . Pt reports she has been [facility]  since.  PT Spo2 8% RA. PT Spo2 98% on 2 liters nasal O2

## 2024-12-21 NOTE — Discharge Instructions (Signed)
 I am glad you are feeling better after the medication.  Use albuterol  every 4-6 hours as needed.  Start Symbicort  twice daily.  Rinse your mouth following use of this medication to prevent thrush.  Start prednisone  as prescribed.  Do not take NSAIDs with this medication including aspirin, ibuprofen/Advil, naproxen/Aleve.  As we discussed, given your medical history and symptoms the safest thing to do is to go to the emergency room.  If you have any recurrent shortness of breath, or develop additional symptoms such as worsening cough, fever, chest pain, weakness you need to go to the emergency room immediately.  Follow-up with your primary care soon as possible.

## 2024-12-21 NOTE — ED Provider Notes (Addendum)
 " [facility]    CSN: [id number] Arrival date & time: 12/21/24  1503      History   Chief Complaint Chief Complaint  Patient presents with   Shortness of Breath    HPI Heidi [name] is a 38 y.o. female.   Patient presents today with a weeklong history of shortness of breath and cough.  She was seen in the emergency room on 12/11/2024 which point her blood work was normal with the exception of an elevated D-dimer and so she had CTA that showed pericardial effusion, pulmonary effusion but no evidence of PE.  She waited for over 7 hours and was not seen and so went home after having negative workup that she was able to view on her MyChart.  Since then, she has had ongoing cough and shortness of breath.  She does have a history of asthma and has been using her albuterol  inhaler but ran out of this and so believes that refill of this medication would be sufficient to manage her symptoms.  She denies any fever, chest pain, nausea, vomiting.  She is confident that she is not pregnant.  Denies any recent antibiotics or steroids.  She does have a history of asthma but has never taken a maintenance medication.  She does have a history of Hodgkin's lymphoma but is not currently receiving treatment; she was seen by oncology in September 2025 and had PET/CT without active disease and so she is being monitored.      Past Medical History:  Diagnosis Date   ARDS (adult respiratory distress syndrome) (HCC)    Asthma    Hodgkin lymphoma (HCC)    Hypertension     Patient Active Problem List   Diagnosis Date Noted   Pericardial effusion 09/10/2023   Pleural effusion 09/10/2023   Thrombocytopenia 09/10/2023   Acute hypoxic respiratory failure (HCC) 09/08/2023   Post herpetic neuralgia    Hodgkin's lymphoma (HCC) 11/14/2018   Herpes zoster without complication    Counseling regarding advance care planning and goals of care 10/09/2018   Hodgkin's disease in adult [facility]) 10/09/2018    Port-A-Cath in place 09/28/2018   Pruritus 01/20/2017   Central line complication    Deep vein thrombosis (DVT) of upper extremity (HCC)    SVC syndrome    Dyspnea 12/14/2016   Swelling 12/14/2016   Symptomatic anemia 11/26/2016   CAP (community acquired pneumonia) 11/23/2016   PNA (pneumonia) 11/23/2016   Hyperpigmentation 06/04/2016   Hypersensitivity reaction 05/04/2016   Encounter for antineoplastic chemotherapy    Hypokalemia 04/20/2016   Volume overload 04/20/2016   S/P bronchoscopy with biopsy    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)    HCAP (healthcare-associated pneumonia) 04/10/2016   Hyponatremia 04/10/2016   Thrombocytosis 04/10/2016   Pneumonitis 03/16/2016   Postobstructive pneumonia 03/16/2016   Acute respiratory failure with hypoxia (HCC) 03/16/2016   Leukocytosis 03/16/2016   Sepsis due to pneumonia (HCC) 03/16/2016   Lung mass    Lymphadenopathy     Past Surgical History:  Procedure Laterality Date   AXILLARY LYMPH NODE BIOPSY Right 03/19/2016   Procedure: AXILLARY LYMPH NODE BIOPSY;  Surgeon: Krystal Spinner, MD;  Location: [facility];  Service: General;  Laterality: Right;   IR GENERIC HISTORICAL  12/22/2016   IR FLUORO GUIDE PORT INSERTION LEFT 12/22/2016 [facility]   IR GENERIC HISTORICAL  12/22/2016   IR US  GUIDE VASC ACCESS LEFT 12/22/2016 [facility]   IR GENERIC HISTORICAL  12/22/2016   IR CV LINE INJECTION 12/22/2016 [facility]   IR GENERIC HISTORICAL  12/22/2016   IR REMOVAL TUN ACCESS W/ PORT W/O FL MOD SED 12/22/2016 [facility]   VIDEO BRONCHOSCOPY Bilateral 11/26/2016   Procedure: VIDEO BRONCHOSCOPY WITH FLUORO;  Surgeon: Harden LULLA Staff, MD;  Location: [facility];  Service: Cardiopulmonary;  Laterality: Bilateral;    OB History   No obstetric history on file.      Home Medications    Prior to Admission medications  Medication Sig Start Date End Date Taking? Authorizing Provider  albuterol   (VENTOLIN  HFA) 108 (90 Base) MCG/ACT inhaler Inhale 1-2 puffs into the lungs every 6 (six) hours as needed for wheezing or shortness of breath. 12/21/24  Yes Euphemia Lingerfelt K, PA-C  budesonide -formoterol  (SYMBICORT ) 80-4.5 MCG/ACT inhaler Inhale 2 puffs into the lungs 2 (two) times daily as needed. 12/21/24  Yes Tevis Dunavan, Rocky POUR, PA-C  predniSONE  (STERAPRED UNI-PAK 21 TAB) 10 MG (21) TBPK tablet Take 40 mg (4 tablets) days 1 and 2, take 30 mg (3 tablets) days 3 and 4, take 20 mg (2 tablets) days 5 and 6, take 10 mg (1 tablet) days 7 and 8, take 5 mg (0.5 mg) days 9 and 10, then stop. 12/21/24  Yes Tsutomu Barfoot K, PA-C  TYLENOL  500 MG tablet Take 500 mg by mouth every 6 (six) hours as needed for mild pain or headache.    [provider]    Family History Family History  Problem Relation Age of Onset   Hypertension Mother     Social History Social History[1]   Allergies   Doxorubicin  hcl liposomal   Review of Systems Review of Systems  Constitutional:  Positive for activity change. Negative for appetite change, fatigue and fever.  HENT:  Negative for congestion and sore throat.   Respiratory:  Positive for cough and shortness of breath.   Cardiovascular:  Negative for chest pain.  Gastrointestinal:  Negative for diarrhea, nausea and vomiting.     Physical Exam Triage Vital Signs ED Triage Vitals  Encounter Vitals Group     BP 12/21/24 1511 120/82     Girls Systolic BP Percentile --      Girls Diastolic BP Percentile --      Boys Systolic BP Percentile --      Boys Diastolic BP Percentile --      Pulse Rate 12/21/24 1511 (!) 106     Resp 12/21/24 1511 (!) 24     Temp 12/21/24 1511 97.8 F (36.6 C)     Temp src --      SpO2 12/21/24 1511 (!) 88 %     Weight --      Height --      Head Circumference --      Peak Flow --      Pain Score 12/21/24 1519 0     Pain Loc --      Pain Education --      Exclude from Growth Chart --    No data found.  Updated Vital  Signs BP 120/82   Pulse (!) 106   Temp 97.8 F (36.6 C)   Resp 19   LMP 11/19/2024 (Approximate)   SpO2 100%   Visual Acuity Right Eye Distance:   Left Eye Distance:   Bilateral Distance:    Right Eye Near:   Left Eye Near:    Bilateral Near:     Physical Exam Vitals reviewed.  Constitutional:  General: She is awake. She is not in acute distress.    Appearance: Normal appearance. She is well-developed. She is ill-appearing.     Interventions: Nasal cannula in place.     Comments: Very pleasant female appears stated age  HENT:     Head: Normocephalic and atraumatic.     Right Ear: External ear normal.     Left Ear: External ear normal.     Nose:     Right Sinus: Maxillary sinus tenderness present. No frontal sinus tenderness.     Left Sinus: Maxillary sinus tenderness and frontal sinus tenderness present.     Mouth/Throat:     Mouth: Mucous membranes are cyanotic.     Pharynx: Uvula midline. No oropharyngeal exudate or posterior oropharyngeal erythema.  Cardiovascular:     Rate and Rhythm: Normal rate and regular rhythm.     Heart sounds: Normal heart sounds, S1 normal and S2 normal. No murmur heard. Pulmonary:     Effort: Pulmonary effort is normal. Tachypnea present.     Breath sounds: Normal breath sounds. No wheezing, rhonchi or rales.  Psychiatric:        Behavior: Behavior is cooperative.      UC Treatments / Results  Labs (all labs ordered are listed, but only abnormal results are displayed) Labs Reviewed - No data to display  EKG   Radiology No results found.  Procedures Procedures (including critical care time)  Medications Ordered in UC Medications  ipratropium-albuterol  (DUONEB) 0.5-2.5 (3) MG/3ML nebulizer solution 3 mL (3 mLs Nebulization Given 12/21/24 1544)    Initial Impression / Assessment and Plan / UC Course  I have reviewed the triage vital signs and the nursing notes.  Pertinent labs & imaging results that were available  during my care of the patient were reviewed by me and considered in my medical decision making (see chart for details).     Patient initially tachycardic, hypoxic, tachypneic.  She was placed on 2 L by nursing staff and had improvement of her oxygen saturation to 98%.  She thought her symptoms are related to her nails as they were initially having difficulty getting her oxygen saturation because of artificial nails and so they attach this to her ear which and her oxygen saturation improved.  We initially discussed that given her new oxygen requirement and clinical presentation the safe thing to do is to go to the emergency room but patient declined to do this as she had a negative workup with them about 10 days ago and was not interested in sitting in the lobby for several hours.  She believes that if she does can get a refill of her inhaler she would feel much better and so I did agree to give her a DuoNeb in clinic and then reassess.  She reported significant improvement of symptoms following this medication and her oxygen saturation remained at 98 to 100% even after discontinuing supplemental oxygen for about 20 minutes.  I believe that her initial hypoxic reading is related to her artificial nails as she reported improvement of symptoms.  Will treat for asthma exacerbation and she was given refill of her albuterol  as well as will start Symbicort .  Discussed that she is to rinse her mouth following use of this medication to prevent thrush.  Will also start prednisone  taper.  We discussed that she is not to take NSAIDs with this medication and risk of GI bleeding.  If her symptoms are not significantly improving with medication regimen or if at  any point she has recurrent or worsening symptoms she needs to go to the emergency room to which she expressed understanding and agreement.  Return precautions given.  All questions answered to patient satisfaction.  Final Clinical Impressions(s) / UC Diagnoses    Final diagnoses:  Moderate persistent asthma with exacerbation  SOB (shortness of breath)     Discharge Instructions      I am glad you are feeling better after the medication.  Use albuterol  every 4-6 hours as needed.  Start Symbicort  twice daily.  Rinse your mouth following use of this medication to prevent thrush.  Start prednisone  as prescribed.  Do not take NSAIDs with this medication including aspirin, ibuprofen/Advil, naproxen/Aleve.  As we discussed, given your medical history and symptoms the safest thing to do is to go to the emergency room.  If you have any recurrent shortness of breath, or develop additional symptoms such as worsening cough, fever, chest pain, weakness you need to go to the emergency room immediately.  Follow-up with your primary care soon as possible.     ED Prescriptions     Medication Sig Dispense Auth. Provider   albuterol  (VENTOLIN  HFA) 108 (90 Base) MCG/ACT inhaler Inhale 1-2 puffs into the lungs every 6 (six) hours as needed for wheezing or shortness of breath. 18 g Helen Winterhalter K, PA-C   predniSONE  (STERAPRED UNI-PAK 21 TAB) 10 MG (21) TBPK tablet Take 40 mg (4 tablets) days 1 and 2, take 30 mg (3 tablets) days 3 and 4, take 20 mg (2 tablets) days 5 and 6, take 10 mg (1 tablet) days 7 and 8, take 5 mg (0.5 mg) days 9 and 10, then stop. 21 tablet Khushboo Chuck K, PA-C   budesonide -formoterol  (SYMBICORT ) 80-4.5 MCG/ACT inhaler Inhale 2 puffs into the lungs 2 (two) times daily as needed. 1 each Alyiah Ulloa, Rocky POUR, PA-C      PDMP not reviewed this encounter.    Sherrell Rocky POUR, PA-C 12/21/24 1630     [1]  Social History Tobacco Use   Smoking status: Some Days    Current packs/day: 0.00    Average packs/day: 0.5 packs/day for 15.0 years (7.5 ttl pk-yrs)    Types: Cigarettes    Start date: 03/27/2001    Last attempt to quit: 03/27/2016    Years since quitting: 8.7   Smokeless tobacco: Never  Vaping Use   Vaping status: Never Used  Substance Use  Topics   Alcohol use: Yes    Comment: occasional now   Drug use: Not Currently     Sherrell Rocky POUR, PA-C 12/21/24 1630  "

## 2025-01-02 ENCOUNTER — Encounter (HOSPITAL_COMMUNITY): Payer: Self-pay | Admitting: Pharmacy Technician

## 2025-01-02 ENCOUNTER — Emergency Department (HOSPITAL_COMMUNITY)

## 2025-01-02 ENCOUNTER — Other Ambulatory Visit: Payer: Self-pay

## 2025-01-02 ENCOUNTER — Inpatient Hospital Stay (HOSPITAL_COMMUNITY)
Admission: EM | Admit: 2025-01-02 | Discharge: 2025-01-08 | DRG: 286 | Discharge status: Against Medical Advice | Attending: Internal Medicine | Admitting: Internal Medicine

## 2025-01-02 DIAGNOSIS — R160 Hepatomegaly, not elsewhere classified: Secondary | ICD-10-CM | POA: Diagnosis present

## 2025-01-02 DIAGNOSIS — Z8249 Family history of ischemic heart disease and other diseases of the circulatory system: Secondary | ICD-10-CM

## 2025-01-02 DIAGNOSIS — C8199 Hodgkin lymphoma, unspecified, extranodal and solid organ sites: Secondary | ICD-10-CM | POA: Diagnosis present

## 2025-01-02 DIAGNOSIS — I3139 Other pericardial effusion (noninflammatory): Secondary | ICD-10-CM | POA: Diagnosis present

## 2025-01-02 DIAGNOSIS — Z888 Allergy status to other drugs, medicaments and biological substances status: Secondary | ICD-10-CM

## 2025-01-02 DIAGNOSIS — R7989 Other specified abnormal findings of blood chemistry: Secondary | ICD-10-CM | POA: Diagnosis present

## 2025-01-02 DIAGNOSIS — J9601 Acute respiratory failure with hypoxia: Secondary | ICD-10-CM | POA: Diagnosis present

## 2025-01-02 DIAGNOSIS — E8809 Other disorders of plasma-protein metabolism, not elsewhere classified: Secondary | ICD-10-CM | POA: Diagnosis present

## 2025-01-02 DIAGNOSIS — D72819 Decreased white blood cell count, unspecified: Secondary | ICD-10-CM | POA: Diagnosis present

## 2025-01-02 DIAGNOSIS — Z23 Encounter for immunization: Secondary | ICD-10-CM

## 2025-01-02 DIAGNOSIS — I5082 Biventricular heart failure: Secondary | ICD-10-CM | POA: Diagnosis present

## 2025-01-02 DIAGNOSIS — I5031 Acute diastolic (congestive) heart failure: Secondary | ICD-10-CM | POA: Diagnosis present

## 2025-01-02 DIAGNOSIS — Z79899 Other long term (current) drug therapy: Secondary | ICD-10-CM

## 2025-01-02 DIAGNOSIS — Z86718 Personal history of other venous thrombosis and embolism: Secondary | ICD-10-CM

## 2025-01-02 DIAGNOSIS — J45901 Unspecified asthma with (acute) exacerbation: Secondary | ICD-10-CM | POA: Diagnosis present

## 2025-01-02 DIAGNOSIS — Z923 Personal history of irradiation: Secondary | ICD-10-CM

## 2025-01-02 DIAGNOSIS — K7460 Unspecified cirrhosis of liver: Secondary | ICD-10-CM | POA: Diagnosis present

## 2025-01-02 DIAGNOSIS — C8190 Hodgkin lymphoma, unspecified, unspecified site: Secondary | ICD-10-CM | POA: Diagnosis present

## 2025-01-02 DIAGNOSIS — E8770 Fluid overload, unspecified: Secondary | ICD-10-CM | POA: Diagnosis present

## 2025-01-02 DIAGNOSIS — E871 Hypo-osmolality and hyponatremia: Secondary | ICD-10-CM | POA: Diagnosis present

## 2025-01-02 DIAGNOSIS — D630 Anemia in neoplastic disease: Secondary | ICD-10-CM | POA: Diagnosis present

## 2025-01-02 DIAGNOSIS — R180 Malignant ascites: Secondary | ICD-10-CM | POA: Diagnosis present

## 2025-01-02 DIAGNOSIS — J90 Pleural effusion, not elsewhere classified: Secondary | ICD-10-CM | POA: Diagnosis present

## 2025-01-02 DIAGNOSIS — R188 Other ascites: Secondary | ICD-10-CM

## 2025-01-02 DIAGNOSIS — Z5329 Procedure and treatment not carried out because of patient's decision for other reasons: Secondary | ICD-10-CM | POA: Diagnosis present

## 2025-01-02 DIAGNOSIS — Z9221 Personal history of antineoplastic chemotherapy: Secondary | ICD-10-CM

## 2025-01-02 DIAGNOSIS — Z91199 Patient's noncompliance with other medical treatment and regimen due to unspecified reason: Secondary | ICD-10-CM

## 2025-01-02 DIAGNOSIS — Z7951 Long term (current) use of inhaled steroids: Secondary | ICD-10-CM

## 2025-01-02 DIAGNOSIS — F17210 Nicotine dependence, cigarettes, uncomplicated: Secondary | ICD-10-CM | POA: Diagnosis present

## 2025-01-02 DIAGNOSIS — I871 Compression of vein: Secondary | ICD-10-CM | POA: Diagnosis present

## 2025-01-02 DIAGNOSIS — K5900 Constipation, unspecified: Secondary | ICD-10-CM | POA: Diagnosis present

## 2025-01-02 DIAGNOSIS — I2721 Secondary pulmonary arterial hypertension: Secondary | ICD-10-CM | POA: Diagnosis present

## 2025-01-02 DIAGNOSIS — I110 Hypertensive heart disease with heart failure: Principal | ICD-10-CM | POA: Diagnosis present

## 2025-01-02 DIAGNOSIS — C787 Secondary malignant neoplasm of liver and intrahepatic bile duct: Secondary | ICD-10-CM | POA: Diagnosis present

## 2025-01-02 DIAGNOSIS — I509 Heart failure, unspecified: Principal | ICD-10-CM

## 2025-01-02 DIAGNOSIS — C7951 Secondary malignant neoplasm of bone: Secondary | ICD-10-CM | POA: Diagnosis present

## 2025-01-02 LAB — URINALYSIS, ROUTINE W REFLEX MICROSCOPIC
Bacteria, UA: NONE SEEN
Bilirubin Urine: NEGATIVE
Glucose, UA: NEGATIVE mg/dL
Hgb urine dipstick: NEGATIVE
Ketones, ur: NEGATIVE mg/dL
Nitrite: NEGATIVE
Protein, ur: 100 mg/dL — AB
Specific Gravity, Urine: 1.01 (ref 1.005–1.030)
pH: 6 (ref 5.0–8.0)

## 2025-01-02 LAB — COMPREHENSIVE METABOLIC PANEL WITH GFR
ALT: 31 U/L (ref 0–44)
AST: 42 U/L — ABNORMAL HIGH (ref 15–41)
Albumin: 3.7 g/dL (ref 3.5–5.0)
Alkaline Phosphatase: 68 U/L (ref 38–126)
Anion gap: 13 (ref 5–15)
BUN: 12 mg/dL (ref 6–20)
CO2: 26 mmol/L (ref 22–32)
Calcium: 8.5 mg/dL — ABNORMAL LOW (ref 8.9–10.3)
Chloride: 96 mmol/L — ABNORMAL LOW (ref 98–111)
Creatinine, Ser: 0.9 mg/dL (ref 0.44–1.00)
GFR, Estimated: >60 mL/min
Glucose, Bld: 89 mg/dL (ref 70–99)
Potassium: 3.9 mmol/L (ref 3.5–5.1)
Sodium: 134 mmol/L — ABNORMAL LOW (ref 135–145)
Total Bilirubin: 1.3 mg/dL — ABNORMAL HIGH (ref 0.0–1.2)
Total Protein: 5.6 g/dL — ABNORMAL LOW (ref 6.5–8.1)

## 2025-01-02 LAB — CBC WITH DIFFERENTIAL/PLATELET
Abs Immature Granulocytes: 0.01 K/uL (ref 0.00–0.07)
Basophils Absolute: 0 K/uL (ref 0.0–0.1)
Basophils Relative: 0 %
Eosinophils Absolute: 0 K/uL (ref 0.0–0.5)
Eosinophils Relative: 0 %
HCT: 37.6 % (ref 36.0–46.0)
Hemoglobin: 12.7 g/dL (ref 12.0–15.0)
Immature Granulocytes: 0 %
Lymphocytes Relative: 7 %
Lymphs Abs: 0.2 K/uL — ABNORMAL LOW (ref 0.7–4.0)
MCH: 32.9 pg (ref 26.0–34.0)
MCHC: 33.8 g/dL (ref 30.0–36.0)
MCV: 97.4 fL (ref 80.0–100.0)
Monocytes Absolute: 0.4 K/uL (ref 0.1–1.0)
Monocytes Relative: 14 %
Neutro Abs: 2.6 K/uL (ref 1.7–7.7)
Neutrophils Relative %: 79 %
Platelets: 199 K/uL (ref 150–400)
RBC: 3.86 MIL/uL — ABNORMAL LOW (ref 3.87–5.11)
RDW: 13.5 % (ref 11.5–15.5)
WBC: 3.3 K/uL — ABNORMAL LOW (ref 4.0–10.5)
nRBC: 0 % (ref 0.0–0.2)

## 2025-01-02 LAB — I-STAT CHEM 8, ED
BUN: 12 mg/dL (ref 6–20)
Calcium, Ion: 1.06 mmol/L — ABNORMAL LOW (ref 1.15–1.40)
Chloride: 94 mmol/L — ABNORMAL LOW (ref 98–111)
Creatinine, Ser: 1 mg/dL (ref 0.44–1.00)
Glucose, Bld: 86 mg/dL (ref 70–99)
HCT: 40 % (ref 36.0–46.0)
Hemoglobin: 13.6 g/dL (ref 12.0–15.0)
Potassium: 3.7 mmol/L (ref 3.5–5.1)
Sodium: 134 mmol/L — ABNORMAL LOW (ref 135–145)
TCO2: 27 mmol/L (ref 22–32)

## 2025-01-02 LAB — TROPONIN T, HIGH SENSITIVITY: Troponin T High Sensitivity: 16 ng/L (ref 0–19)

## 2025-01-02 LAB — PRO BRAIN NATRIURETIC PEPTIDE: Pro Brain Natriuretic Peptide: 9034 pg/mL — ABNORMAL HIGH

## 2025-01-02 LAB — LIPASE, BLOOD: Lipase: 18 U/L (ref 11–51)

## 2025-01-02 LAB — HCG, QUANTITATIVE, PREGNANCY: hCG, Beta Chain, Quant, S: <1 m[IU]/mL

## 2025-01-02 MED ORDER — IPRATROPIUM-ALBUTEROL 0.5-2.5 (3) MG/3ML IN SOLN
3.0000 mL | Freq: Four times a day (QID) | RESPIRATORY_TRACT | Status: DC
  Administered 2025-01-02 – 2025-01-06 (×14): 3 mL via RESPIRATORY_TRACT
  Filled 2025-01-02 (×14): qty 3

## 2025-01-02 MED ORDER — FUROSEMIDE 10 MG/ML IJ SOLN
40.0000 mg | Freq: Once | INTRAMUSCULAR | Status: AC
  Administered 2025-01-02: 40 mg via INTRAVENOUS
  Filled 2025-01-02: qty 4

## 2025-01-02 MED ORDER — ENOXAPARIN SODIUM 40 MG/0.4ML IJ SOSY
40.0000 mg | PREFILLED_SYRINGE | INTRAMUSCULAR | Status: DC
  Administered 2025-01-02 – 2025-01-06 (×5): 40 mg via SUBCUTANEOUS
  Filled 2025-01-02 (×5): qty 0.4

## 2025-01-02 MED ORDER — LISINOPRIL 5 MG PO TABS
2.5000 mg | ORAL_TABLET | Freq: Every day | ORAL | Status: DC
  Administered 2025-01-02 – 2025-01-04 (×3): 2.5 mg via ORAL
  Filled 2025-01-02 (×4): qty 1

## 2025-01-02 MED ORDER — FUROSEMIDE 10 MG/ML IJ SOLN
20.0000 mg | Freq: Two times a day (BID) | INTRAMUSCULAR | Status: AC
  Administered 2025-01-03 – 2025-01-04 (×4): 20 mg via INTRAVENOUS
  Filled 2025-01-02: qty 4
  Filled 2025-01-02 (×3): qty 2

## 2025-01-02 MED ORDER — ALBUTEROL SULFATE (2.5 MG/3ML) 0.083% IN NEBU: 2.5000 mg | INHALATION_SOLUTION | RESPIRATORY_TRACT | Status: DC | PRN

## 2025-01-02 MED ORDER — IOHEXOL 300 MG/ML  SOLN: 100.0000 mL | Freq: Once | INTRAMUSCULAR | Status: DC | PRN

## 2025-01-02 MED ORDER — MORPHINE SULFATE (PF) 4 MG/ML IV SOLN
4.0000 mg | INTRAVENOUS | Status: AC | PRN
  Administered 2025-01-03 (×3): 4 mg via INTRAVENOUS
  Filled 2025-01-02 (×3): qty 1

## 2025-01-02 MED ORDER — MORPHINE SULFATE (PF) 4 MG/ML IV SOLN
4.0000 mg | Freq: Once | INTRAVENOUS | Status: AC
  Administered 2025-01-02: 4 mg via INTRAVENOUS
  Filled 2025-01-02: qty 1

## 2025-01-02 MED ORDER — ACETAMINOPHEN 650 MG RE SUPP: 650.0000 mg | Freq: Four times a day (QID) | RECTAL | Status: DC | PRN

## 2025-01-02 MED ORDER — IPRATROPIUM-ALBUTEROL 0.5-2.5 (3) MG/3ML IN SOLN
3.0000 mL | Freq: Once | RESPIRATORY_TRACT | Status: AC
  Administered 2025-01-02: 3 mL via RESPIRATORY_TRACT
  Filled 2025-01-02: qty 3

## 2025-01-02 MED ORDER — ONDANSETRON HCL 4 MG/2ML IJ SOLN: 4.0000 mg | Freq: Four times a day (QID) | INTRAMUSCULAR | Status: DC | PRN

## 2025-01-02 MED ORDER — METHYLPREDNISOLONE SODIUM SUCC 125 MG IJ SOLR
125.0000 mg | Freq: Every day | INTRAMUSCULAR | Status: AC
  Administered 2025-01-02: 125 mg via INTRAVENOUS
  Filled 2025-01-02: qty 2

## 2025-01-02 MED ORDER — ACETAMINOPHEN 325 MG PO TABS
650.0000 mg | ORAL_TABLET | Freq: Four times a day (QID) | ORAL | Status: DC | PRN
  Administered 2025-01-04 – 2025-01-07 (×2): 650 mg via ORAL
  Filled 2025-01-02 (×2): qty 2

## 2025-01-02 MED ORDER — ONDANSETRON HCL 4 MG/2ML IJ SOLN
4.0000 mg | Freq: Once | INTRAMUSCULAR | Status: AC
  Administered 2025-01-02: 4 mg via INTRAVENOUS
  Filled 2025-01-02: qty 2

## 2025-01-02 MED ORDER — ONDANSETRON HCL 4 MG PO TABS: 4.0000 mg | ORAL_TABLET | Freq: Four times a day (QID) | ORAL | Status: DC | PRN

## 2025-01-02 MED ORDER — MAGNESIUM SULFATE 2 GM/50ML IV SOLN
2.0000 g | Freq: Once | INTRAVENOUS | Status: AC
  Administered 2025-01-02: 2 g via INTRAVENOUS
  Filled 2025-01-02: qty 50

## 2025-01-02 MED ORDER — IOHEXOL 350 MG/ML SOLN
100.0000 mL | Freq: Once | INTRAVENOUS | Status: AC | PRN
  Administered 2025-01-02: 100 mL via INTRAVENOUS

## 2025-01-02 NOTE — H&P (Signed)
 " History and Physical    Patient: Heidi [name] FMW:[id number] DOB: 1986/02/07 DOA: 01/02/2025 DOS: the patient was seen and examined on 01/02/2025 PCP: Pcp, No  Patient coming from: Home  Chief Complaint:   HPI: Heidi [name] is a 39 y.o. female with medical history significant of ARDS, Asthma, hypertension, Hodgkin's lymphoma noncompliance, healthcare associated pneumonia, history of DVT, central line complication, herpes zoster, pericardial effusion, pleural effusion who presented to the emergency department complaint is that she has had progressively worse abdominal distention for the past 3 weeks, but particularly has had more distention in the last week.  She has experienced some constipation, nausea, decreased appetite but no emesis, diarrhea, constipation, melena or hematochezia.  She has also been progressively dyspneic associated with wheezing and has some bilateral lower extremity edema last month, which seems to have resolved.  Denied fever, chills, rhinorrhea, sore throat or hemoptysis.  No chest pain, palpitations, diaphoresis or PND.  No flank pain, dysuria, frequency or hematuria.  No polyuria, polydipsia, polyphagia or blurred vision.   Lab work: Urinalysis showed protein of 100 mg/dL and trace leukocyte esterase.  CBC showed a white count of 3.3, hemoglobin 12.7 g/dL and platelets 800.  Lipase, serum pregnancy test and first troponin were normal.  proBNP was 9034.0 pg/mL.  CMP showed a sodium 134 and chloride of 96 mmol/L with a normal anion gap.  Total bilirubin was 1.3 and corrected calcium  8.7 mg/dL.  Total protein 5.6 g/dL and AST 42 units/L.  The rest of the electrolytes, the rest of the hepatic functions, glucose and renal function were normal.  Imaging: Portable 1 view chest radiograph showing similar cardiomegaly which previously was noted to be a combination of cardiomegaly and moderate pericardial effusion.  CT abdomen/pelvis with contrast showing diffusely  heterogeneous appearance of the hepatic parenchyma concerning for metastatic disease.  MRI recommended.  Mild ascites.  Moderate gallbladder wall thickening which may be due to adjacent hepatocellular disease or ascites.  Ill-defined sclerotic densities are again seen in the spine and pelvis concerning for metastatic disease.  ED course: Initial vital signs were temperature 97.4 F, pulse 128, respiration 32, BP 126/92 mmHg and O2 sat 80% on room air.  The patient received furosemide  40 mg IVP and ondansetron  4 mg IVP.  I added a DuoNeb, morphine  4 mg IVP and methylprednisolone  125 mg IVPB   Review of Systems: As mentioned in the history of present illness. All other systems reviewed and are negative. Past Medical History:  Diagnosis Date   ARDS (adult respiratory distress syndrome) (HCC)    Asthma    Hodgkin lymphoma (HCC)    Hypertension    Past Surgical History:  Procedure Laterality Date   AXILLARY LYMPH NODE BIOPSY Right 03/19/2016   Procedure: AXILLARY LYMPH NODE BIOPSY;  Surgeon: Krystal Spinner, MD;  Location: [facility];  Service: General;  Laterality: Right;   IR GENERIC HISTORICAL  12/22/2016   IR FLUORO GUIDE PORT INSERTION LEFT 12/22/2016 [facility]   IR GENERIC HISTORICAL  12/22/2016   IR US  GUIDE VASC ACCESS LEFT 12/22/2016 [facility]   IR GENERIC HISTORICAL  12/22/2016   IR CV LINE INJECTION 12/22/2016 [facility]   IR GENERIC HISTORICAL  12/22/2016   IR REMOVAL TUN ACCESS W/ PORT W/O FL MOD SED 12/22/2016 [facility]   VIDEO BRONCHOSCOPY Bilateral 11/26/2016   Procedure: VIDEO BRONCHOSCOPY WITH FLUORO;  Surgeon: Harden LULLA Staff, MD;  Location[facility];  Service: Cardiopulmonary;  Laterality: Bilateral;   Social History:  reports that she has been smoking cigarettes. She started smoking about 23 years ago. She has a 7.5 pack-year smoking history. She has never used smokeless tobacco. She reports current alcohol use. She reports that she does not currently use  drugs.  Allergies[1]  Family History  Problem Relation Age of Onset   Hypertension Mother     Prior to Admission medications  Medication Sig Start Date End Date Taking? Authorizing Provider  albuterol  (VENTOLIN  HFA) 108 (90 Base) MCG/ACT inhaler Inhale 1-2 puffs into the lungs every 6 (six) hours as needed for wheezing or shortness of breath. 12/21/24  Yes Raspet, Erin K, PA-C  budesonide -formoterol  (SYMBICORT ) 80-4.5 MCG/ACT inhaler Inhale 2 puffs into the lungs 2 (two) times daily as needed. Patient taking differently: Inhale 2 puffs into the lungs 2 (two) times daily as needed (for respiratory flares). 12/21/24  Yes Raspet, Erin K, PA-C  predniSONE  (STERAPRED UNI-PAK 21 TAB) 10 MG (21) TBPK tablet Take 40 mg (4 tablets) days 1 and 2, take 30 mg (3 tablets) days 3 and 4, take 20 mg (2 tablets) days 5 and 6, take 10 mg (1 tablet) days 7 and 8, take 5 mg (0.5 mg) days 9 and 10, then stop. Patient not taking: Reported on 01/02/2025 12/21/24   Sherrell Rocky MARLA DEVONNA    Physical Exam: Vitals:   01/02/25 1157 01/02/25 1335 01/02/25 1546 01/02/25 1609  BP: (!) 126/92 (!) 127/92 (!) 113/100   Pulse: (!) 128 (!) 102 (!) 104   Resp: (!) 32 (!) 30 20   Temp: (!) 97.4 F (36.3 C)   98 F (36.7 C)  TempSrc: Oral   Oral  SpO2: (!) 80% 94% 95%    Physical Exam Vitals and nursing note reviewed.  Constitutional:      General: She is awake. She is not in acute distress.    Appearance: She is ill-appearing.     Interventions: Nasal cannula in place.  HENT:     Head: Normocephalic.     Nose: No rhinorrhea.     Mouth/Throat:     Mouth: Mucous membranes are dry.  Eyes:     General: No scleral icterus.    Pupils: Pupils are equal, round, and reactive to light.  Neck:     Vascular: No JVD.  Cardiovascular:     Rate and Rhythm: Normal rate and regular rhythm.     Heart sounds: S1 normal and S2 normal.  Pulmonary:     Effort: Prolonged expiration present. No tachypnea, accessory muscle usage or  respiratory distress.     Breath sounds: Decreased air movement present. Decreased breath sounds and wheezing present. No rhonchi or rales.  Abdominal:     General: Bowel sounds are normal. There is no distension.     Palpations: Abdomen is soft.     Tenderness: There is no abdominal tenderness. There is no right CVA tenderness or left CVA tenderness.  Musculoskeletal:     Cervical back: Neck supple.     Right lower leg: 3+ Edema present.     Left lower leg: 3+ Edema present.  Skin:    General: Skin is warm and dry.  Neurological:     General: No focal deficit present.     Mental Status: She is alert and oriented to person, place, and time.  Psychiatric:        Mood and Affect: Mood normal.        Behavior: Behavior normal. Behavior is cooperative.     Data  Reviewed:  Results are pending, will review when available. 09/08/2023 echocardiogram report.  IMPRESSIONS:   1. Left ventricular ejection fraction, by estimation, is 65 to 70%. The  left ventricle has normal function. The left ventricle has no regional  wall motion abnormalities. Left ventricular diastolic parameters are  indeterminate. There is the  interventricular septum is flattened in systole, consistent with right  ventricular pressure overload.   2. Right ventricular systolic function is moderately reduced. The right  ventricular size is normal. Tricuspid regurgitation signal is inadequate  for assessing PA pressure.   3. Small to moderate circumfrential pericardial effusion. The pericardial  effusion is circumferential.   4. The mitral valve is normal in structure. Trivial mitral valve  regurgitation. No evidence of mitral stenosis.   5. The aortic valve is tricuspid. Aortic valve regurgitation is not  visualized. No aortic stenosis is present.   6. The inferior vena cava is dilated in size with <50% respiratory  variability, suggesting right atrial pressure of 15 mmHg.   Conclusion(s)/Recommendation(s):  Technically limited echo due to poor  sound wave transmission. There is a small to moderate pericardial efussion  with a dilated IVC. Doubt tamponade physiology but mitral inflow with  respirometer not performed. Given  pericardial effusion, RV dysfunction and IVC dialtion would consider  evalaution for pulmonary HTN.   EKG: Vent. rate 101 BPM PR interval 130 ms QRS duration 105 ms QT/QTcB 363/471 ms P-R-T axes 60 169 -30 Sinus tachycardia Right axis deviation Low voltage, precordial leads Borderline repolarization abnormality  Assessment and Plan: Principal Problem:   Acute respiratory failure with hypoxia (HCC) Due to:    Asthma exacerbation  Superimposed on:   Volume overload In the setting of:   Ascites, malignant (HCC)   Pericardial effusion   Pleural effusion Observation/telemetry. Supplemental oxygen as needed. Sodium and fluid restriction. Continue furosemide  20 mg IVP twice daily. Monitor daily weights, intake and output. Check transthoracic echocardiogram. Methylprednisolone  125 mg IVP x1. Followed by prednisone  40 mg p.o. daily in a.m. Scheduled and as needed bronchodilators. Follow-up CBC and chemistry in the morning.  Needs to use her Symbicort  regularly.  Active Problems:   Hodgkin's disease in adult [facility]) Associated with:   Liver mass   Hyperbilirubinemia   Abnormal LFTs (liver function tests) Check MRI of liver as suggested by radiology. Follow-up hepatic function test in the morning. Consult hematology/oncology in AM. Compliance has been an issue in her case.    Metastasis to bone [facility]) In the setting of Hodgkin's lymphoma. Needs to be compliant with hematology treatment plan. Could this be due to depression? -Will consider psychiatry evaluation while in the hospital.    Leukopenia Follow white blood cell count in AM.    Hypocalcemia Recheck calcium  level in AM. Further workup depending on results.     Advance Care Planning:   Code  Status: Full Code   Consults:   Family Communication:   Severity of Illness: The appropriate patient status for this patient is OBSERVATION. Observation status is judged to be reasonable and necessary in order to provide the required intensity of service to ensure the patient's safety. The patient's presenting symptoms, physical exam findings, and initial radiographic and laboratory data in the context of their medical condition is felt to place them at decreased risk for further clinical deterioration. Furthermore, it is anticipated that the patient will be medically stable for discharge from the hospital within 2 midnights of admission.   Author: Alm Dorn Castor, MD 01/02/2025 5:01 PM  For on call review [email or web address].   This document was prepared using Dragon voice recognition software and may contain some unintended transcription errors.     [1]  Allergies Allergen Reactions   Doxorubicin  Hcl Liposomal Anaphylaxis, Shortness Of Breath and Other (See Comments)    Pt had severe hypersensitivity reaction with chest pain and SOB immediately following start of medication. See hypersensitivity note from 07/14/2023.   "

## 2025-01-02 NOTE — ED Notes (Signed)
 Patient transported to CT

## 2025-01-02 NOTE — ED Provider Notes (Signed)
 " [place] EMERGENCY DEPARTMENT AT [facility] Provider Note  CSN: [id number] Arrival date & time: 01/02/25 1154  Chief Complaint(s) No chief complaint on file.  HPI Heidi [name] is a 39 y.o. female history of Hodgkin lymphoma, currently in remission presenting with abdominal distention.  Patient reports she has noticed over the past few weeks that she has been having worsening abdominal distention.  Has also felt constipated and having early satiety.  No fevers, reports some cough.  Reports nausea but no vomiting.  Denies any chest pain.  Reports she also feels shortness of breath especially with exertion.  Had some leg swelling earlier although this seems to be resolved.  Also reports feeling a bump in her abdomen.   Past Medical History Past Medical History:  Diagnosis Date   ARDS (adult respiratory distress syndrome) (HCC)    Asthma    Hodgkin lymphoma (HCC)    Hypertension    Patient Active Problem List   Diagnosis Date Noted   Pericardial effusion 09/10/2023   Pleural effusion 09/10/2023   Thrombocytopenia 09/10/2023   Acute hypoxic respiratory failure (HCC) 09/08/2023   Post herpetic neuralgia    Hodgkin's lymphoma (HCC) 11/14/2018   Herpes zoster without complication    Counseling regarding advance care planning and goals of care 10/09/2018   Hodgkin's disease in adult [facility]) 10/09/2018   Port-A-Cath in place 09/28/2018   Pruritus 01/20/2017   Central line complication    Deep vein thrombosis (DVT) of upper extremity (HCC)    SVC syndrome    Dyspnea 12/14/2016   Swelling 12/14/2016   Symptomatic anemia 11/26/2016   CAP (community acquired pneumonia) 11/23/2016   PNA (pneumonia) 11/23/2016   Hyperpigmentation 06/04/2016   Hypersensitivity reaction 05/04/2016   Encounter for antineoplastic chemotherapy    Hypokalemia 04/20/2016   Volume overload 04/20/2016   S/P bronchoscopy with biopsy    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 Authorizing Provider  albuterol  (VENTOLIN  HFA) 108 (90 Base) MCG/ACT inhaler Inhale 1-2 puffs into the lungs every 6 (six) hours as needed for wheezing or shortness of breath. 12/21/24   Raspet, Erin K, PA-C  budesonide -formoterol  (SYMBICORT ) 80-4.5 MCG/ACT inhaler Inhale 2 puffs into the lungs 2 (two) times daily as needed. 12/21/24   Raspet, Erin K, PA-C  predniSONE  (STERAPRED UNI-PAK 21 TAB) 10 MG (21) TBPK tablet Take 40 mg (4 tablets) days 1 and 2, take 30 mg (3 tablets) days 3 and 4, take 20 mg (2 tablets) days 5 and 6, take 10 mg (1 tablet) days 7 and 8, take 5 mg (0.5 mg) days 9 and 10, then stop. 12/21/24   Raspet, Erin K, PA-C  TYLENOL  500 MG tablet Take 500 mg by mouth every 6 (six) hours as needed for mild pain or headache.    [provider]  Past Surgical History Past Surgical History:  Procedure Laterality Date   AXILLARY LYMPH NODE BIOPSY Right 03/19/2016   Procedure: AXILLARY LYMPH NODE BIOPSY;  Surgeon: Krystal Spinner, MD;  Location: [facility];  Service: General;  Laterality: Right;   IR GENERIC HISTORICAL  12/22/2016   IR FLUORO GUIDE PORT INSERTION LEFT 12/22/2016 [facility]   IR GENERIC HISTORICAL  12/22/2016   IR US  GUIDE VASC ACCESS LEFT 12/22/2016 [facility]   IR GENERIC HISTORICAL  12/22/2016   IR CV LINE INJECTION 12/22/2016 [facility]   IR GENERIC HISTORICAL  12/22/2016   IR  REMOVAL TUN ACCESS W/ PORT W/O FL MOD SED 12/22/2016 [facility]   VIDEO BRONCHOSCOPY Bilateral 11/26/2016   Procedure: VIDEO BRONCHOSCOPY WITH FLUORO;  Surgeon: Harden LULLA Staff, MD;  Location: [facility] END[facility];  Service: Cardiopulmonary;  Laterality: Bilateral;   Family History Family History  Problem Relation Age of Onset   Hypertension Mother     Social History Social History[1] Allergies Doxorubicin  hcl liposomal  Review of Systems Review of Systems  All other systems reviewed and are negative.   Physical Exam Vital Signs  I have reviewed the triage vital signs BP (!) 113/100 (BP Location: Left Arm)   Pulse (!) 104   Temp 98 F (36.7 C) (Oral)   Resp 20   LMP 12/11/2024 (Approximate)   SpO2 95%  Physical Exam Vitals and nursing note reviewed.  Constitutional:      General: She is not in acute distress.    Appearance: She is well-developed.  HENT:     Head: Normocephalic and atraumatic.     Mouth/Throat:     Mouth: Mucous membranes are moist.  Eyes:     Pupils: Pupils are equal, round, and reactive to light.  Neck:     Vascular: Hepatojugular reflux and JVD present.  Cardiovascular:     Rate and Rhythm: Normal rate and regular rhythm.     Heart sounds: No murmur heard. Pulmonary:     Effort: Pulmonary effort is normal. No respiratory distress.     Breath sounds: Examination of the right-middle field reveals rales. Examination of the right-lower field reveals decreased breath sounds. Examination of the left-lower field reveals rales. Decreased breath sounds and rales (basilar) present. No wheezing or rhonchi.  Abdominal:     General: There is distension.     Palpations: Abdomen is soft.     Tenderness: There is no abdominal tenderness. There is no rebound.  Musculoskeletal:        General: No tenderness.     Right lower leg: No edema.     Left lower leg: No edema.  Skin:    General: Skin is warm and dry.  Neurological:     General: No focal deficit present.      Mental Status: She is alert. Mental status is at baseline.  Psychiatric:        Mood and Affect: Mood normal.        Behavior: Behavior normal.     ED Results and Treatments Labs (all labs ordered are listed, but only abnormal results are displayed) Labs Reviewed  CBC WITH DIFFERENTIAL/PLATELET - Abnormal; Notable for the following components:      Result Value   WBC 3.3 (*)    RBC 3.86 (*)    Lymphs Abs 0.2 (*)    All other components within normal limits  COMPREHENSIVE METABOLIC PANEL WITH GFR - Abnormal; Notable for the following components:   Sodium 134 (*)  Chloride 96 (*)    Calcium  8.5 (*)    Total Protein 5.6 (*)    AST 42 (*)    Total Bilirubin 1.3 (*)    All other components within normal limits  URINALYSIS, ROUTINE W REFLEX MICROSCOPIC - Abnormal; Notable for the following components:   Protein, ur 100 (*)    Leukocytes,Ua TRACE (*)    All other components within normal limits  PRO BRAIN NATRIURETIC PEPTIDE - Abnormal; Notable for the following components:   Pro Brain Natriuretic Peptide 9,034.0 (*)    All other components within normal limits  I-STAT CHEM 8, ED - Abnormal; Notable for the following components:   Sodium 134 (*)    Chloride 94 (*)    Calcium , Ion 1.06 (*)    All other components within normal limits  LIPASE, BLOOD  HCG, QUANTITATIVE, PREGNANCY  TROPONIN T, HIGH SENSITIVITY                                                                                                                          Radiology CT Angio Chest PE W and/or Wo Contrast Result Date: 01/02/2025 EXAM: CTA of the Chest with contrast for PE 01/02/2025 03:22:03 PM TECHNIQUE: CTA of the chest was performed after the administration of 100 mL of iohexol  (OMNIPAQUE ) 350 MG/ML injection. Multiplanar reformatted images are provided for review. MIP images are provided for review. Automated exposure control, iterative reconstruction, and/or weight based adjustment of the mA/kV was  utilized to reduce the radiation dose to as low as reasonably achievable. COMPARISON: 12/11/2024 CLINICAL HISTORY: Pulmonary embolism (PE) suspected, high prob. FINDINGS: PULMONARY ARTERIES: No pulmonary embolism. Unchanged dilation of the main pulmonary artery measuring up to 3.3 cm, suggestive of unaligned pulmonary arterial hypertension. Turbulent flow related artifact in the right pulmonary artery. MEDIASTINUM: Unchanged cardiomegaly with a moderate to large volume pericardial effusion. Reflux of contrast into the dilated intrahepatic IVC and the hepatic veins, consistent with underlying cardiac dysfunction. Left chest port terminates at the cavoatrial junction. Rightward shift of the cardiomediastinal structures due to right sided volume loss. There is no acute abnormality of the thoracic aorta. LYMPH NODES: No mediastinal, hilar or axillary lymphadenopathy. LUNGS AND PLEURA: Similar perihilar consolidation in the right lung with scattered areas of perihilar consolidation in the left upper lobe and lingula, likely scarring. Small right pleural effusion is also unchanged. Mild intralobular septal thickening noted within both upper lobes. Regions of air trapping also noted predominantly in the left lung. No pneumothorax. UPPER ABDOMEN: Partially visualized upper abdominal ascites. Mild anasarca. SOFT TISSUES AND BONES: No acute bone or soft tissue abnormality. IMPRESSION: 1. No evidence of pulmonary embolism. 2. Unchanged cardiomegaly with a moderate to large volume pericardial effusion. Reflux of contrast into the dilated intrahepatic IVC and hepatic veins, consistent with cardiac dysfunction. Mild intralobular septal thickening predominantly noted within both upper lobes , suggesting interstitial edema. 3. Unchanged dilation of the main pulmonary artery measuring up to 3.3 cm, suggestive of pulmonary  arterial hypertension. 4. Small right pleural effusion, unchanged. Mild anasarca. Electronically signed by:  Rogelia Myers MD MD 01/02/2025 03:37 PM EST RP Workstation: [id number]   CT ABDOMEN PELVIS W CONTRAST Result Date: 01/02/2025 CLINICAL DATA:  Abdominal swelling for 2 weeks. History of lymphoma. EXAM: CT ABDOMEN AND PELVIS WITH CONTRAST TECHNIQUE: Multidetector CT imaging of the abdomen and pelvis was performed using the standard protocol following bolus administration of intravenous contrast. RADIATION DOSE REDUCTION: This exam was performed according to the departmental dose-optimization program which includes automated exposure control, adjustment of the mA and/or kV according to patient size and/or use of iterative reconstruction technique. CONTRAST:  100 mL OMNIPAQUE  IOHEXOL  300 MG/ML SOLN, OMNIPAQUE  IOHEXOL  350 MG/ML SOLN COMPARISON:  PET scan of August 24, 2024. CT scan of February 17, 2021. FINDINGS: Hepatobiliary: Diffusely heterogeneous appearance of hepatic parenchyma is noted. Metastatic disease cannot be excluded. No cholelithiasis is noted, but moderate gallbladder wall thickening is noted which may be due to adjacent hepatocellular disease or ascites. No biliary dilatation is noted. Pancreas: Unremarkable. No pancreatic ductal dilatation or surrounding inflammatory changes. Spleen: Normal in size without focal abnormality. Adrenals/Urinary Tract: Adrenal glands are unremarkable. Kidneys are normal, without renal calculi, focal lesion, or hydronephrosis. Bladder is unremarkable. Stomach/Bowel: Stomach is within normal limits. Appendix appears normal. No evidence of bowel wall thickening, distention, or inflammatory changes. Vascular/Lymphatic: No significant vascular findings are present. No enlarged abdominal or pelvic lymph nodes. Reproductive: Uterus and bilateral adnexa are unremarkable. Other: Mild ascites is noted.  No definite hernia is noted. Musculoskeletal: Ill-defined sclerotic densities are again noted in the spine and pelvis concerning for metastatic disease. IMPRESSION: 1.  Diffusely heterogeneous appearance of hepatic parenchyma is noted concerning for metastatic disease. MRI is recommended for further evaluation. 2. Mild ascites is noted. 3. Moderate gallbladder wall thickening is noted which may be due to adjacent hepatocellular disease or ascites. 4. Ill-defined sclerotic densities are again noted in the spine and pelvis concerning for metastatic disease. Electronically Signed   By: Lynwood Landy Raddle M.D.   On: 01/02/2025 15:34   DG Chest Port 1 View Result Date: 01/02/2025 EXAM: 1 VIEW(S) XRAY OF THE CHEST 01/02/2025 01:01:00 PM COMPARISON: 12/11/2024 CLINICAL HISTORY: shob FINDINGS: LINES, TUBES AND DEVICES: Left Port-A-Cath in place with tip overlying the expected region of the superior cavoatrial junction. LUNGS AND PLEURA: Mild bilateral interstitial opacities with similar perihilar consolidation on the right with chronic blunting of the right costophrenic sulcus and elevation of the right hemidiaphragm. This likely represents a combination of scarring and small right pleural effusion. Perihilar reticular opacity is also noted on the lef also likely a combination of subsegmental atelectasis and scarring. t. No pneumothorax. HEART AND MEDIASTINUM: Similar enlargement of the cardiac silhouette. BONES AND SOFT TISSUES: No acute osseous abnormality. IMPRESSION: 1. Similar enlargement of the cardiac silhouette, which was previously noted to be a combination of cardiomegaly and moderate pericardial effusion. No pneumonia or pulmonary edema. Electronically signed by: Rogelia Myers MD 01/02/2025 01:27 PM EST RP Workstation: [id number]    Pertinent labs & imaging results that were available during my care of the patient were reviewed by me and considered in my medical decision making (see MDM for details).  Medications Ordered in ED Medications  enoxaparin  (LOVENOX ) injection 40 mg (has no administration in time range)  furosemide  (LASIX ) injection 20 mg (has no administration  in time range)  lisinopril  (ZESTRIL ) tablet 2.5 mg (has no administration in time range)  acetaminophen  (TYLENOL ) tablet 650 mg (has  no administration in time range)    Or  acetaminophen  (TYLENOL ) suppository 650 mg (has no administration in time range)  ondansetron  (ZOFRAN ) tablet 4 mg (has no administration in time range)    Or  ondansetron  (ZOFRAN ) injection 4 mg (has no administration in time range)  ondansetron  (ZOFRAN ) injection 4 mg (4 mg Intravenous Given 01/02/25 1335)  furosemide  (LASIX ) injection 40 mg (40 mg Intravenous Given 01/02/25 1426)  iohexol  (OMNIPAQUE ) 350 MG/ML injection 100 mL (100 mLs Intravenous Contrast Given 01/02/25 1513)                                                                                                                                     Procedures .Critical Care  Performed by: Francesca Elsie CROME, MD Authorized by: Francesca Elsie CROME, MD   Critical care provider statement:    Critical care time (minutes):  30   Critical care was necessary to treat or prevent imminent or life-threatening deterioration of the following conditions:  Respiratory failure   Critical care was time spent personally by me on the following activities:  Development of treatment plan with patient or surrogate, discussions with consultants, evaluation of patient's response to treatment, examination of patient, ordering and review of laboratory studies, ordering and review of radiographic studies, ordering and performing treatments and interventions, pulse oximetry, re-evaluation of patient's condition and review of old charts   (including critical care time)  Medical Decision Making / ED Course   MDM:  39 year old presenting to the emergency department abdominal distention.  Patient with some mild abdominal distention, also found to be tachycardic, tachypneic with hypoxia to the 79% on room air.  Patient appears somewhat volume overloaded on exam with JVD, abdominal  distention and pulmonary crackles.  Has had prior pulmonary hypertension and cardiomegaly with effusion.  Differential includes CHF, pulmonary hypertension, cirrhosis, pneumonia, tamponade, anemia.  Patient will definitely need to be admitted for further management.  Will obtain CT abdomen with new abdominal distention and pain although no tenderness.  Will also obtain CT PE scan given vital signs with tachycardia, hypoxia  Clinical Course as of 01/02/25 1644  Wed Jan 02, 2025  1606 Workup notable for signs of CHF, new onset ascites and possible metastatic disease to liver.  Difficult to tell if cirrhosis is due to CHF or primarily metastatic.  Will need diuresis and further inpatient care.  Discussed with hospitalist Dr. Celinda who will admit patient. [WS]    Clinical Course User Index [WS] Francesca Elsie CROME, MD     Additional history obtained:  -External records from outside source obtained and reviewed including: Chart review including previous notes, labs, imaging, consultation notes including prior notes    Lab Tests: -I ordered, reviewed, and interpreted labs.   The pertinent results include:   Labs Reviewed  CBC WITH DIFFERENTIAL/PLATELET - Abnormal; Notable for the following components:      Result Value   WBC  3.3 (*)    RBC 3.86 (*)    Lymphs Abs 0.2 (*)    All other components within normal limits  COMPREHENSIVE METABOLIC PANEL WITH GFR - Abnormal; Notable for the following components:   Sodium 134 (*)    Chloride 96 (*)    Calcium  8.5 (*)    Total Protein 5.6 (*)    AST 42 (*)    Total Bilirubin 1.3 (*)    All other components within normal limits  URINALYSIS, ROUTINE W REFLEX MICROSCOPIC - Abnormal; Notable for the following components:   Protein, ur 100 (*)    Leukocytes,Ua TRACE (*)    All other components within normal limits  PRO BRAIN NATRIURETIC PEPTIDE - Abnormal; Notable for the following components:   Pro Brain Natriuretic Peptide 9,034.0 (*)    All other  components within normal limits  I-STAT CHEM 8, ED - Abnormal; Notable for the following components:   Sodium 134 (*)    Chloride 94 (*)    Calcium , Ion 1.06 (*)    All other components within normal limits  LIPASE, BLOOD  HCG, QUANTITATIVE, PREGNANCY  TROPONIN T, HIGH SENSITIVITY    Notable for elevated BNP   EKG   EKG Interpretation Date/Time:  Wednesday January 02 2025 15:39:59 EST Ventricular Rate:  101 PR Interval:  130 QRS Duration:  105 QT Interval:  363 QTC Calculation: 471 R Axis:   169  Text Interpretation: Sinus tachycardia Right axis deviation Low voltage, precordial leads Borderline repolarization abnormality Confirmed by Francesca Fallow ([id number]) on 01/02/2025 3:42:25 PM         Imaging Studies ordered: I ordered imaging studies including CTA chest, CT abdomen  On my interpretation imaging demonstrates cirrhosis, no PE , cardiomegaly, effusion I independently visualized and interpreted imaging. I agree with the radiologist interpretation   Medicines ordered and prescription drug management: Meds ordered this encounter  Medications   ondansetron  (ZOFRAN ) injection 4 mg   furosemide  (LASIX ) injection 40 mg   DISCONTD: iohexol  (OMNIPAQUE ) 300 MG/ML solution 100 mL   iohexol  (OMNIPAQUE ) 350 MG/ML injection 100 mL   enoxaparin  (LOVENOX ) injection 40 mg   furosemide  (LASIX ) injection 20 mg   lisinopril  (ZESTRIL ) tablet 2.5 mg   OR Linked Order Group    acetaminophen  (TYLENOL ) tablet 650 mg    acetaminophen  (TYLENOL ) suppository 650 mg   OR Linked Order Group    ondansetron  (ZOFRAN ) tablet 4 mg    ondansetron  (ZOFRAN ) injection 4 mg    -I have reviewed the patients home medicines and have made adjustments as needed   Consultations Obtained: I requested consultation with the hospitalist,  and discussed lab and imaging findings as well as pertinent plan - they recommend: admission   Cardiac Monitoring: The patient was maintained on a cardiac monitor.  I  personally viewed and interpreted the cardiac monitored which showed an underlying rhythm of: NSR  Reevaluation: After the interventions noted above, I reevaluated the patient and found that their symptoms have improved  Co morbidities that complicate the patient evaluation  Past Medical History:  Diagnosis Date   ARDS (adult respiratory distress syndrome) (HCC)    Asthma    Hodgkin lymphoma (HCC)    Hypertension       Dispostion: Disposition decision including need for hospitalization was considered, and patient admitted to the hospital.    Final Clinical Impression(s) / ED Diagnoses Final diagnoses:  Acute congestive heart failure, unspecified heart failure type (HCC)  Acute hypoxic respiratory failure (HCC)  Cirrhosis  of liver with ascites, unspecified hepatic cirrhosis type [facility])     This chart was dictated using voice recognition software.  Despite best efforts to proofread,  errors can occur which can change the documentation meaning.     [1]  Social History Tobacco Use   Smoking status: Some Days    Current packs/day: 0.00    Average packs/day: 0.5 packs/day for 15.0 years (7.5 ttl pk-yrs)    Types: Cigarettes    Start date: 03/27/2001    Last attempt to quit: 03/27/2016    Years since quitting: 8.7   Smokeless tobacco: Never  Vaping Use   Vaping status: Never Used  Substance Use Topics   Alcohol use: Yes    Comment: occasional now   Drug use: Not Currently     Francesca Elsie CROME, MD 01/02/25 1644  "

## 2025-01-02 NOTE — ED Notes (Signed)
 I took patient straight back to triage and nurse said there was no rooms available and to place patient in front of desk I hooked patient up to the pulse ox on the dinamap and nurse went to go and get an oxygen tank,and placed on 6 liters.

## 2025-01-02 NOTE — ED Provider Triage Note (Addendum)
 Emergency Medicine Provider Triage Evaluation Note  Heidi [name] , a 39 y.o. female  was evaluated in triage.  Pt complains of abdominal distention, shortness of breath.  Symptoms been ongoing for several weeks, patient notes worsening abdominal distention that seems to be contributing to her shortness of breath, no abdominal pain or chest pain.  Was recently treated for a viral illness with a course of prednisone , albuterol , no other medication changes, no recent travel/surgery/immobilization.  Review of Systems  Positive: As above Negative: As above  Physical Exam  BP (!) 126/92 (BP Location: Left Arm)   Pulse (!) 128   Temp (!) 97.4 F (36.3 C) (Oral)   Resp (!) 32   LMP 11/19/2024 (Approximate)   SpO2 (!) 80%  Gen:   Awake, no distress   Resp:  Normal effort  MSK:   Moves extremities without difficulty  Other:  Abdominal distension  Medical Decision Making  Medically screening exam initiated at 12:19 PM.  Appropriate orders placed.  Heidi [name] was informed that the remainder of the evaluation will be completed by another provider, this initial triage assessment does not replace that evaluation, and the importance of remaining in the ED until their evaluation is complete.   Placed on 6L [place] due to hypoxia, O2 sats improved, no evidence of respiratory distress, speaking in full/clear sentences.    Heidi [name], [place] 01/02/25 1222

## 2025-01-02 NOTE — ED Triage Notes (Signed)
 Pt here with reports of 2 weeks of abdominal swelling. Pt feeling constipated, has used some OTC meds with minimal relief. Pt with 1 week of fatigue, dizziness, LOC, nausea. Pt noted to by hypoxic down to 79% during triage. Placed pt on 6L [place] with improvement to 97%.

## 2025-01-03 ENCOUNTER — Observation Stay (HOSPITAL_COMMUNITY)

## 2025-01-03 ENCOUNTER — Inpatient Hospital Stay (HOSPITAL_COMMUNITY)

## 2025-01-03 DIAGNOSIS — R7989 Other specified abnormal findings of blood chemistry: Secondary | ICD-10-CM | POA: Diagnosis not present

## 2025-01-03 DIAGNOSIS — C8199 Hodgkin lymphoma, unspecified, extranodal and solid organ sites: Secondary | ICD-10-CM | POA: Diagnosis present

## 2025-01-03 DIAGNOSIS — K5900 Constipation, unspecified: Secondary | ICD-10-CM | POA: Diagnosis present

## 2025-01-03 DIAGNOSIS — I2721 Secondary pulmonary arterial hypertension: Secondary | ICD-10-CM | POA: Diagnosis present

## 2025-01-03 DIAGNOSIS — C8190 Hodgkin lymphoma, unspecified, unspecified site: Secondary | ICD-10-CM | POA: Diagnosis not present

## 2025-01-03 DIAGNOSIS — R160 Hepatomegaly, not elsewhere classified: Secondary | ICD-10-CM | POA: Diagnosis present

## 2025-01-03 DIAGNOSIS — D72819 Decreased white blood cell count, unspecified: Secondary | ICD-10-CM | POA: Diagnosis present

## 2025-01-03 DIAGNOSIS — I5031 Acute diastolic (congestive) heart failure: Secondary | ICD-10-CM | POA: Diagnosis present

## 2025-01-03 DIAGNOSIS — Z23 Encounter for immunization: Secondary | ICD-10-CM | POA: Diagnosis not present

## 2025-01-03 DIAGNOSIS — I110 Hypertensive heart disease with heart failure: Secondary | ICD-10-CM | POA: Diagnosis present

## 2025-01-03 DIAGNOSIS — E8809 Other disorders of plasma-protein metabolism, not elsewhere classified: Secondary | ICD-10-CM | POA: Diagnosis present

## 2025-01-03 DIAGNOSIS — C7951 Secondary malignant neoplasm of bone: Secondary | ICD-10-CM | POA: Diagnosis present

## 2025-01-03 DIAGNOSIS — K7460 Unspecified cirrhosis of liver: Secondary | ICD-10-CM | POA: Diagnosis present

## 2025-01-03 DIAGNOSIS — I3139 Other pericardial effusion (noninflammatory): Secondary | ICD-10-CM | POA: Diagnosis present

## 2025-01-03 DIAGNOSIS — I5082 Biventricular heart failure: Secondary | ICD-10-CM | POA: Diagnosis present

## 2025-01-03 DIAGNOSIS — C787 Secondary malignant neoplasm of liver and intrahepatic bile duct: Secondary | ICD-10-CM | POA: Diagnosis present

## 2025-01-03 DIAGNOSIS — J90 Pleural effusion, not elsewhere classified: Secondary | ICD-10-CM | POA: Diagnosis not present

## 2025-01-03 DIAGNOSIS — I871 Compression of vein: Secondary | ICD-10-CM | POA: Diagnosis present

## 2025-01-03 DIAGNOSIS — J9601 Acute respiratory failure with hypoxia: Secondary | ICD-10-CM | POA: Diagnosis present

## 2025-01-03 DIAGNOSIS — J45901 Unspecified asthma with (acute) exacerbation: Secondary | ICD-10-CM | POA: Diagnosis present

## 2025-01-03 DIAGNOSIS — Z7951 Long term (current) use of inhaled steroids: Secondary | ICD-10-CM | POA: Diagnosis not present

## 2025-01-03 DIAGNOSIS — I311 Chronic constrictive pericarditis: Secondary | ICD-10-CM | POA: Diagnosis not present

## 2025-01-03 DIAGNOSIS — R180 Malignant ascites: Secondary | ICD-10-CM | POA: Diagnosis present

## 2025-01-03 DIAGNOSIS — E8770 Fluid overload, unspecified: Secondary | ICD-10-CM | POA: Diagnosis present

## 2025-01-03 DIAGNOSIS — D630 Anemia in neoplastic disease: Secondary | ICD-10-CM | POA: Diagnosis present

## 2025-01-03 DIAGNOSIS — I509 Heart failure, unspecified: Secondary | ICD-10-CM | POA: Diagnosis not present

## 2025-01-03 DIAGNOSIS — F17210 Nicotine dependence, cigarettes, uncomplicated: Secondary | ICD-10-CM | POA: Diagnosis present

## 2025-01-03 DIAGNOSIS — E871 Hypo-osmolality and hyponatremia: Secondary | ICD-10-CM | POA: Diagnosis present

## 2025-01-03 LAB — COMPREHENSIVE METABOLIC PANEL WITH GFR
ALT: 34 U/L (ref 0–44)
AST: 38 U/L (ref 15–41)
Albumin: 3.7 g/dL (ref 3.5–5.0)
Alkaline Phosphatase: 70 U/L (ref 38–126)
Anion gap: 10 (ref 5–15)
BUN: 13 mg/dL (ref 6–20)
CO2: 29 mmol/L (ref 22–32)
Calcium: 8.2 mg/dL — ABNORMAL LOW (ref 8.9–10.3)
Chloride: 95 mmol/L — ABNORMAL LOW (ref 98–111)
Creatinine, Ser: 1.01 mg/dL — ABNORMAL HIGH (ref 0.44–1.00)
GFR, Estimated: >60 mL/min
Glucose, Bld: 143 mg/dL — ABNORMAL HIGH (ref 70–99)
Potassium: 4.6 mmol/L (ref 3.5–5.1)
Sodium: 134 mmol/L — ABNORMAL LOW (ref 135–145)
Total Bilirubin: 0.8 mg/dL (ref 0.0–1.2)
Total Protein: 6.1 g/dL — ABNORMAL LOW (ref 6.5–8.1)

## 2025-01-03 LAB — ECHOCARDIOGRAM COMPLETE
AR max vel: 2.71 cm2
AV Area VTI: 2.45 cm2
AV Area mean vel: 2.72 cm2
AV Mean grad: 2 mmHg
AV Peak grad: 3.2 mmHg
Ao pk vel: 0.9 m/s
Area-P 1/2: 6.67 cm2
Calc EF: 70.1 %
Height: 67 in
S' Lateral: 2.5 cm
Single Plane A2C EF: 62.9 %
Single Plane A4C EF: 72 %
Weight: 2356.28 [oz_av]

## 2025-01-03 LAB — CBC
HCT: 40.3 % (ref 36.0–46.0)
Hemoglobin: 13.1 g/dL (ref 12.0–15.0)
MCH: 32.5 pg (ref 26.0–34.0)
MCHC: 32.5 g/dL (ref 30.0–36.0)
MCV: 100 fL (ref 80.0–100.0)
Platelets: 214 K/uL (ref 150–400)
RBC: 4.03 MIL/uL (ref 3.87–5.11)
RDW: 13.4 % (ref 11.5–15.5)
WBC: 4.1 K/uL (ref 4.0–10.5)
nRBC: 0 % (ref 0.0–0.2)

## 2025-01-03 LAB — HIV ANTIBODY (ROUTINE TESTING W REFLEX): HIV Screen 4th Generation wRfx: NONREACTIVE

## 2025-01-03 MED ORDER — BUDESONIDE 0.25 MG/2ML IN SUSP
0.2500 mg | Freq: Two times a day (BID) | RESPIRATORY_TRACT | Status: DC
  Administered 2025-01-03 – 2025-01-08 (×10): 0.25 mg via RESPIRATORY_TRACT
  Filled 2025-01-03 (×11): qty 2

## 2025-01-03 MED ORDER — SODIUM CHLORIDE 0.9% FLUSH
10.0000 mL | Freq: Two times a day (BID) | INTRAVENOUS | Status: DC
  Administered 2025-01-03 – 2025-01-07 (×8): 10 mL
  Administered 2025-01-07: 20 mL

## 2025-01-03 MED ORDER — LORAZEPAM 2 MG/ML IJ SOLN: 1.0000 mg | INTRAMUSCULAR | Status: DC | PRN

## 2025-01-03 MED ORDER — LORAZEPAM 2 MG/ML IJ SOLN
2.0000 mg | INTRAMUSCULAR | Status: AC | PRN
  Administered 2025-01-03 – 2025-01-08 (×2): 2 mg via INTRAVENOUS
  Filled 2025-01-03 (×2): qty 1

## 2025-01-03 MED ORDER — ORAL CARE MOUTH RINSE: 15.0000 mL | OROMUCOSAL | Status: DC | PRN

## 2025-01-03 MED ORDER — GADOBUTROL 1 MMOL/ML IV SOLN
6.0000 mL | Freq: Once | INTRAVENOUS | Status: AC | PRN
  Administered 2025-01-03: 6 mL via INTRAVENOUS

## 2025-01-03 MED ORDER — CHLORHEXIDINE GLUCONATE CLOTH 2 % EX PADS
6.0000 | MEDICATED_PAD | Freq: Every day | CUTANEOUS | Status: DC
  Administered 2025-01-03 – 2025-01-07 (×5): 6 via TOPICAL

## 2025-01-03 MED ORDER — SODIUM CHLORIDE 0.9% FLUSH: 10.0000 mL | INTRAVENOUS | Status: DC | PRN

## 2025-01-03 NOTE — Progress Notes (Signed)
 " PROGRESS NOTE    TELESIA ATES  FMW:[id number] DOB: Apr 30, 1986 DOA: 01/02/2025 PCP: Pcp, No  Subjective: Patient reports poor appetite, distension. Denied any nausea or vomiting. Very nervous about getting the MRI  Hospital Course: 39 year old female with PMH of asthma, Hodgkin's lymphoma, noncompliance, pericardial effusion who presented to the ED with worsening abdominal distention, shortness of breath.  CT abdomen/pelvis with contrast showed diffusely heterogeneous appearance of the hepatic parenchyma, concerning for metastatic disease and MRI was recommended.  She was given IV Lasix  and admitted to medicine.   Assessment and Plan:  Acute hypoxic respiratory failure - most likely from volume overload with elevated BNP, ascites, and anasarca. There was also concern for possible asthma exacerbation  - check TTE, CT chest showed unchanged cardiomegaly with moderate to large pericardial effusion. May need cardiology input after the TTE - contine IV lasix  20 mg BID - on 4L O2 currently, wean as able  - continue prednisone  for now  - continue nebs  Hodgkins lymphoma, liver disease  - CT abd showed diffusely heterogeneous appearance of the hepatic parenchyma, which could be due to recent chemotherapy or hepatic congestion, but there is also concern for metastatic disease - MRI liver, if any mass found will need IR for potential biopsy  - patient follows with Dr. Onesimo who is aware - has been non-compliant in the past but is motivated to follow up now   Bone mets - due to Hodgkin's disease - has been stable per last PET scan - f/u with oncology   Leukopenia - resolved   HTN - on lisinopril      DVT prophylaxis: enoxaparin  (LOVENOX ) injection 40 mg Start: 01/02/25 2200    Code Status: Full Code Disposition Plan: TBD Reason for continuing need for hospitalization: MRI liver  Objective: Vitals:   01/03/25 0830 01/03/25 0845 01/03/25 1003 01/03/25 1007  BP: (!) 118/90  118/85    Pulse: 87 88 99 91  Resp:      Temp:      TempSrc:      SpO2: 92% 96%  90%    Intake/Output Summary (Last 24 hours) at 01/03/2025 1244 Last data filed at 01/02/2025 2204 Gross per 24 hour  Intake 38.88 ml  Output --  Net 38.88 ml   There were no vitals filed for this visit.  Examination:  Physical Exam Vitals and nursing note reviewed.  Cardiovascular:     Rate and Rhythm: Normal rate. Rhythm irregular.  Pulmonary:     Effort: No respiratory distress.  Abdominal:     General: There is distension.     Palpations: Abdomen is soft.     Tenderness: There is no abdominal tenderness.  Musculoskeletal:     Right lower leg: Edema present.     Left lower leg: Edema present.     Data Reviewed: I have personally reviewed following labs and imaging studies  CBC: Recent Labs  Lab 01/02/25 1256 01/02/25 1306 01/03/25 0527  WBC 3.3*  --  4.1  NEUTROABS 2.6  --   --   HGB 12.7 13.6 13.1  HCT 37.6 40.0 40.3  MCV 97.4  --  100.0  PLT 199  --  214   Basic Metabolic Panel: Recent Labs  Lab 01/02/25 1256 01/02/25 1306 01/03/25 0527  NA 134* 134* 134*  K 3.9 3.7 4.6  CL 96* 94* 95*  CO2 26  --  29  GLUCOSE 89 86 143*  BUN 12 12 13   CREATININE 0.90 1.00 1.01*  CALCIUM  8.5*  --  8.2*   GFR: CrCl cannot be calculated (Unknown ideal weight.). Liver Function Tests: Recent Labs  Lab 01/02/25 1256 01/03/25 0527  AST 42* 38  ALT 31 34  ALKPHOS 68 70  BILITOT 1.3* 0.8  PROT 5.6* 6.1*  ALBUMIN  3.7 3.7   Recent Labs  Lab 01/02/25 1256  LIPASE 18   No results for input(s): AMMONIA in the last 168 hours. Coagulation Profile: No results for input(s): INR, PROTIME in the last 168 hours. Cardiac Enzymes: No results for input(s): CKTOTAL, CKMB, CKMBINDEX, TROPONINI in the last 168 hours. ProBNP, BNP (last 5 results) Recent Labs    01/02/25 1256  PROBNP 9,034.0*   HbA1C: No results for input(s): HGBA1C in the last 72 hours. CBG: No results for  input(s): GLUCAP in the last 168 hours. Lipid Profile: No results for input(s): CHOL, HDL, LDLCALC, TRIG, CHOLHDL, LDLDIRECT in the last 72 hours. Thyroid  Function Tests: No results for input(s): TSH, T4TOTAL, FREET4, T3FREE, THYROIDAB in the last 72 hours. Anemia Panel: No results for input(s): VITAMINB12, FOLATE, FERRITIN, TIBC, IRON, RETICCTPCT in the last 72 hours. Sepsis Labs: No results for input(s): PROCALCITON, LATICACIDVEN in the last 168 hours.  No results found for this or any previous visit (from the past 240 hours).   Radiology Studies: CT Angio Chest PE W and/or Wo Contrast Result Date: 01/02/2025 EXAM: CTA of the Chest with contrast for PE 01/02/2025 03:22:03 PM TECHNIQUE: CTA of the chest was performed after the administration of 100 mL of iohexol  (OMNIPAQUE ) 350 MG/ML injection. Multiplanar reformatted images are provided for review. MIP images are provided for review. Automated exposure control, iterative reconstruction, and/or weight based adjustment of the mA/kV was utilized to reduce the radiation dose to as low as reasonably achievable. COMPARISON: 12/11/2024 CLINICAL HISTORY: Pulmonary embolism (PE) suspected, high prob. FINDINGS: PULMONARY ARTERIES: No pulmonary embolism. Unchanged dilation of the main pulmonary artery measuring up to 3.3 cm, suggestive of unaligned pulmonary arterial hypertension. Turbulent flow related artifact in the right pulmonary artery. MEDIASTINUM: Unchanged cardiomegaly with a moderate to large volume pericardial effusion. Reflux of contrast into the dilated intrahepatic IVC and the hepatic veins, consistent with underlying cardiac dysfunction. Left chest port terminates at the cavoatrial junction. Rightward shift of the cardiomediastinal structures due to right sided volume loss. There is no acute abnormality of the thoracic aorta. LYMPH NODES: No mediastinal, hilar or axillary lymphadenopathy. LUNGS AND PLEURA:  Similar perihilar consolidation in the right lung with scattered areas of perihilar consolidation in the left upper lobe and lingula, likely scarring. Small right pleural effusion is also unchanged. Mild intralobular septal thickening noted within both upper lobes. Regions of air trapping also noted predominantly in the left lung. No pneumothorax. UPPER ABDOMEN: Partially visualized upper abdominal ascites. Mild anasarca. SOFT TISSUES AND BONES: No acute bone or soft tissue abnormality. IMPRESSION: 1. No evidence of pulmonary embolism. 2. Unchanged cardiomegaly with a moderate to large volume pericardial effusion. Reflux of contrast into the dilated intrahepatic IVC and hepatic veins, consistent with cardiac dysfunction. Mild intralobular septal thickening predominantly noted within both upper lobes , suggesting interstitial edema. 3. Unchanged dilation of the main pulmonary artery measuring up to 3.3 cm, suggestive of pulmonary arterial hypertension. 4. Small right pleural effusion, unchanged. Mild anasarca. Electronically signed by: Rogelia Myers MD MD 01/02/2025 03:37 PM EST RP Workstation: [id number]   CT ABDOMEN PELVIS W CONTRAST Result Date: 01/02/2025 CLINICAL DATA:  Abdominal swelling for 2 weeks. History of lymphoma. EXAM: CT ABDOMEN AND  PELVIS WITH CONTRAST TECHNIQUE: Multidetector CT imaging of the abdomen and pelvis was performed using the standard protocol following bolus administration of intravenous contrast. RADIATION DOSE REDUCTION: This exam was performed according to the departmental dose-optimization program which includes automated exposure control, adjustment of the mA and/or kV according to patient size and/or use of iterative reconstruction technique. CONTRAST:  100 mL OMNIPAQUE  IOHEXOL  300 MG/ML SOLN, OMNIPAQUE  IOHEXOL  350 MG/ML SOLN COMPARISON:  PET scan of August 24, 2024. CT scan of February 17, 2021. FINDINGS: Hepatobiliary: Diffusely heterogeneous appearance of hepatic  parenchyma is noted. Metastatic disease cannot be excluded. No cholelithiasis is noted, but moderate gallbladder wall thickening is noted which may be due to adjacent hepatocellular disease or ascites. No biliary dilatation is noted. Pancreas: Unremarkable. No pancreatic ductal dilatation or surrounding inflammatory changes. Spleen: Normal in size without focal abnormality. Adrenals/Urinary Tract: Adrenal glands are unremarkable. Kidneys are normal, without renal calculi, focal lesion, or hydronephrosis. Bladder is unremarkable. Stomach/Bowel: Stomach is within normal limits. Appendix appears normal. No evidence of bowel wall thickening, distention, or inflammatory changes. Vascular/Lymphatic: No significant vascular findings are present. No enlarged abdominal or pelvic lymph nodes. Reproductive: Uterus and bilateral adnexa are unremarkable. Other: Mild ascites is noted.  No definite hernia is noted. Musculoskeletal: Ill-defined sclerotic densities are again noted in the spine and pelvis concerning for metastatic disease. IMPRESSION: 1. Diffusely heterogeneous appearance of hepatic parenchyma is noted concerning for metastatic disease. MRI is recommended for further evaluation. 2. Mild ascites is noted. 3. Moderate gallbladder wall thickening is noted which may be due to adjacent hepatocellular disease or ascites. 4. Ill-defined sclerotic densities are again noted in the spine and pelvis concerning for metastatic disease. Electronically Signed   By: Lynwood Landy Raddle M.D.   On: 01/02/2025 15:34   DG Chest Port 1 View Result Date: 01/02/2025 EXAM: 1 VIEW(S) XRAY OF THE CHEST 01/02/2025 01:01:00 PM COMPARISON: 12/11/2024 CLINICAL HISTORY: shob FINDINGS: LINES, TUBES AND DEVICES: Left Port-A-Cath in place with tip overlying the expected region of the superior cavoatrial junction. LUNGS AND PLEURA: Mild bilateral interstitial opacities with similar perihilar consolidation on the right with chronic blunting of the right  costophrenic sulcus and elevation of the right hemidiaphragm. This likely represents a combination of scarring and small right pleural effusion. Perihilar reticular opacity is also noted on the lef also likely a combination of subsegmental atelectasis and scarring. t. No pneumothorax. HEART AND MEDIASTINUM: Similar enlargement of the cardiac silhouette. BONES AND SOFT TISSUES: No acute osseous abnormality. IMPRESSION: 1. Similar enlargement of the cardiac silhouette, which was previously noted to be a combination of cardiomegaly and moderate pericardial effusion. No pneumonia or pulmonary edema. Electronically signed by: Rogelia Myers MD 01/02/2025 01:27 PM EST RP Workstation: [id number]    Scheduled Meds:  enoxaparin  (LOVENOX ) injection  40 mg Subcutaneous Q24H   furosemide   20 mg Intravenous BID   ipratropium-albuterol   3 mL Nebulization QID   lisinopril   2.5 mg Oral Daily   Continuous Infusions:   LOS: 0 days   Time spent: 40 minutes  Casimer Dare, MD  [facility]  01/03/2025, 12:44 PM   "

## 2025-01-03 NOTE — ED Notes (Addendum)
 Pt back from MR and could not tolerate d/t claustrophobic  , MD messaged to be made aware, and reports she will be down to talk to pt soon. Pt aware and denies any additional needs at this time \

## 2025-01-04 DIAGNOSIS — I311 Chronic constrictive pericarditis: Secondary | ICD-10-CM

## 2025-01-04 LAB — SEDIMENTATION RATE: Sed Rate: 3 mm/h (ref 0–22)

## 2025-01-04 MED ORDER — DIPHENHYDRAMINE HCL 25 MG PO CAPS
25.0000 mg | ORAL_CAPSULE | Freq: Four times a day (QID) | ORAL | Status: AC | PRN
  Administered 2025-01-04 – 2025-01-05 (×2): 25 mg via ORAL
  Filled 2025-01-04 (×2): qty 1

## 2025-01-04 MED ORDER — OXYCODONE HCL 5 MG PO TABS
5.0000 mg | ORAL_TABLET | ORAL | Status: DC | PRN
  Administered 2025-01-04 – 2025-01-07 (×11): 5 mg via ORAL
  Filled 2025-01-04 (×11): qty 1

## 2025-01-04 MED ORDER — FUROSEMIDE 40 MG PO TABS
40.0000 mg | ORAL_TABLET | Freq: Every day | ORAL | Status: DC
  Administered 2025-01-05 – 2025-01-06 (×2): 40 mg via ORAL
  Filled 2025-01-04 (×2): qty 1

## 2025-01-04 NOTE — Consult Note (Addendum)
 "  As below, patient seen and examined.  Briefly she is a 39 year old female with past medical history of hypertension, Hodgkin's lymphoma, SVC syndrome, noncompliance for evaluation of possible constriction/pericardial effusion.  Patient diagnosed with lymphoma 2017 and has undergone chemotherapy and also radiation to her chest for right lung mass causing SVC compression.  Patient states that recently she has had progressive dyspnea on exertion for 2 weeks.  No orthopnea or PND.  She did have pedal edema and abdominal distention as well.  She denies chest pain, palpitations or syncope though some orthostatic symptoms.  She presented to the emergency room and CTA showed no pulmonary embolus, moderate to large pericardial effusion, dilated IVC/hepatic veins suggestive of cardiac dysfunction, mild anasarca and small right pleural effusion.  Creatinine is 1.01, liver functions are normal, hemoglobin 13.1, HIV nonreactive.  proBNP 9034.  Electrocardiogram shows sinus rhythm with right axis deviation and inferior T wave inversion.  Echocardiogram shows normal LV function, moderate RV dysfunction, mild right ventricular enlargement, severe pulmonary hypertension, mild left atrial enlargement, moderate right atrial enlargement, moderate pericardial effusion not suggestive of tamponade physiology.  Question constriction.  1 question constriction-patient has had radiation to her chest previously.  She would be at risk.  She has improved with diuresis.  Will change Lasix  to 40 mg by mouth daily.  We will plan to have her follow-up in the CHF clinic for further evaluation and right heart catheterization to better assess.  2 pericardial effusion-moderate on most recent echo and present since at least 07/04/23. No tamponade. Will need fu echo in the future.  3 severe pulmonary hypertension-etiology unclear.  She has had radiation to her lungs.  CT showed no pulmonary embolus.  Will check rheumatologic labs.  Question  contribution from pulmonary venous hypertension.  As above plan right heart catheterization.  4 lymphoma-per oncology.  Redell Shallow, MD  Cardiology Consultation   Patient ID: Heidi [name] MRN: [id number]; DOB: 04/05/1986  Admit date: 01/02/2025 Date of Consult: 01/04/2025  PCP:  Freddrick No   [facility] Providers Cardiologist:  New - Dr. Shallow   Patient Profile: Heidi [name] is a 39 y.o. 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 Caleen.  History of Present Illness: Ms. Cost is a 39 year old female with the above history. She has a history of Hodgkin's lymphoma that was initially diagnosed in 2017 during an admission for acute hypoxic respiratory failure secondary to ARDS and obstructive PNA. Treatment for her lymphoma has been complicated by non-compliance. She was previously treated with 5 cycles of AVD, 7 cycles of Bendamustine  + Brentuximab, and 2 cycles of ICE. She also underwent palliative radiation to a right lung mass causing SVC compression. Most recent PET scan in 07/2024 showed no evidence of recurrent lymphoma and inactive quiescent osseous metastatic disease.  Patient presented to the ED on 01/02/2025 for further evaluation of shortness of breath and abdominal distension. EKG showed mild sinus tachycardia, rate 101 bpm, with abnormal T wave inversions in inferior leads but no significant change compared to prior tracings. High-sensitivity troponin negative x2. Pro BNP elevated at 9.034. Chest x-ray showed enlargement of cardiac silhouette but no acute findings. Chest CTA was negative for PE but showed unchanged cardiomegaly with a moderate to large pericardial effusion, reflux of contrast into the dilated intrahepatic IVC and  hepatic veins consistent  with cardiac dysfunction, mild intralobular septal thickening suggesting interstitial edema, unchanged dilatation of the main pulmonary artery, small right pericardial effusion, and mild anasarca. Abdominal/ pelvic CT showed diffuse heterogenous appearance of hepatic parenchyma concerning for metastatic disease, mild ascites, and an ill-defined sclerotic densities in the spine and pelvis concerning for metastatic disease. WBC 3.3, Hgb 12.7, Plts 199. Na 134, K 3.9, Glucose 89, BUN 12, Cr 0.90. Albumin  3.7, AST 42, ALT 31, Alk Phos 68, Total Bili 1.3. Lipase normal. She as admitted for acute hypoxic respiratory failure felt to be secondary to asthma exacerbation and CHF. She was started on steroid and IV Lasix .  Echo was ordered and showed LVEF of 65-70% with no regional wall motion abnormalities, moderately reduced RV function with severely elevated PASP of 64.2 mmHg and a flattened interventricular septum in systole and diastole consistent with RV pressure and volume overload, and a moderate pericardial effusion. The pericardium was measured at 0.63cm adjacent to the RA during diastole consistent with significant thickening. Based on these results and history of Hodgkin's lymphoma with prior chemo and radiation, there is concern for constriction causing pulmonary hypertension and RV failure. Of note, last Echo in 08/2023 also showed moderately reduced RV with small to moderate pericardial effusion but TR signal was inadequate for assessing PA pressure. Cardiology consulted for further evaluation.  Patient has intermittent shortness of breath especially which she has an URI symptoms. However, she started to have dyspnea on exertion for 2 weeks followed by lower/ upper extremity edema and abdominal distension as well as some mild weight gain recently. She denies any orthopnea or PND. No chest pain. She reports some heart racing occasionally as well as some lightheadedness/ dizziness with standing. No syncope. No  fevers.  She does have a history of tobacco use but reports she has not smoked in the last week.  Past Medical History:  Diagnosis Date   ARDS (adult respiratory distress syndrome) (HCC)    Asthma    Hodgkin lymphoma (HCC)    Hypertension     Past Surgical History:  Procedure Laterality Date   AXILLARY LYMPH NODE BIOPSY Right 03/19/2016   Procedure: AXILLARY LYMPH NODE BIOPSY;  Surgeon: Krystal Spinner, MD;  Location: [facility];  Service: General;  Laterality: Right;   IR GENERIC HISTORICAL  12/22/2016   IR FLUORO GUIDE PORT INSERTION LEFT 12/22/2016 [facility]   IR GENERIC HISTORICAL  12/22/2016   IR US  GUIDE VASC ACCESS LEFT 12/22/2016 [facility]   IR GENERIC HISTORICAL  12/22/2016   IR CV LINE INJECTION 12/22/2016 [facility]   IR GENERIC HISTORICAL  12/22/2016   IR REMOVAL TUN ACCESS W/ PORT W/O FL MOD SED 12/22/2016 [facility]   VIDEO BRONCHOSCOPY Bilateral 11/26/2016   Procedure: VIDEO BRONCHOSCOPY WITH FLUORO;  Surgeon: Harden LULLA Staff, MD;  Location: [facility];  Service: Cardiopulmonary;  Laterality: Bilateral;     Home Medications:  Prior to Admission medications  Medication Sig Start Date End Date Taking? Authorizing Provider  albuterol  (VENTOLIN  HFA) 108 (90 Base) MCG/ACT inhaler Inhale 1-2 puffs into the lungs every 6 (six) hours as needed for wheezing or shortness of breath. 12/21/24  Yes Raspet, Erin K, PA-C  budesonide -formoterol  (SYMBICORT ) 80-4.5 MCG/ACT inhaler Inhale 2 puffs into the lungs 2 (two) times daily as needed. Patient taking differently: Inhale 2 puffs into the lungs 2 (two) times daily as needed (for respiratory flares). 12/21/24  Yes Raspet, Erin K, PA-C  predniSONE  (STERAPRED UNI-PAK 21 TAB) 10 MG (  21) TBPK tablet Take 40 mg (4 tablets) days 1 and 2, take 30 mg (3 tablets) days 3 and 4, take 20 mg (2 tablets) days 5 and 6, take 10 mg (1 tablet) days 7 and 8, take 5 mg (0.5 mg) days 9 and 10, then stop. Patient not taking: Reported on 01/02/2025  12/21/24   Raspet, Erin K, PA-C    Scheduled Meds:  budesonide  (PULMICORT ) nebulizer solution  0.25 mg Nebulization BID   Chlorhexidine  Gluconate Cloth  6 each Topical Daily   enoxaparin  (LOVENOX ) injection  40 mg Subcutaneous Q24H   furosemide   20 mg Intravenous BID   ipratropium-albuterol   3 mL Nebulization QID   sodium chloride  flush  10-40 mL Intracatheter Q12H   Continuous Infusions:  PRN Meds: acetaminophen  **OR** acetaminophen , albuterol , LORazepam , ondansetron  **OR** ondansetron  (ZOFRAN ) IV, mouth rinse, sodium chloride  flush  Allergies:   Allergies[1]  Social History:   Social History   Socioeconomic History   Marital status: Single    Spouse name: Not on file   Number of children: Not on file   Years of education: Not on file   Highest education level: Not on file  Occupational History   Not on file  Tobacco Use   Smoking status: Some Days    Current packs/day: 0.00    Average packs/day: 0.5 packs/day for 15.0 years (7.5 ttl pk-yrs)    Types: Cigarettes    Start date: 03/27/2001    Last attempt to quit: 03/27/2016    Years since quitting: 8.7   Smokeless tobacco: Never  Vaping Use   Vaping status: Never Used  Substance and Sexual Activity   Alcohol use: Yes    Comment: occasional now   Drug use: Not Currently   Sexual activity: Yes    Birth control/protection: None  Other Topics Concern   Not on file  Social History Narrative   Not on file   Social Drivers of Health   Tobacco Use: High Risk (01/02/2025)   Patient History    Smoking Tobacco Use: Some Days    Smokeless Tobacco Use: Never    Passive Exposure: Not on file  Financial Resource Strain: Not on file  Food Insecurity: No Food Insecurity (01/03/2025)   Epic    Worried About Programme Researcher, Broadcasting/film/video in the Last Year: Never true    Ran Out of Food in the Last Year: Never true  Transportation Needs: No Transportation Needs (01/03/2025)   Epic    Lack of Transportation (Medical): No    Lack of  Transportation (Non-Medical): No  Physical Activity: Not on file  Stress: Not on file  Social Connections: Not on file  Intimate Partner Violence: Not At Risk (01/03/2025)   Epic    Fear of Current or Ex-Partner: No    Emotionally Abused: No    Physically Abused: No    Sexually Abused: No  Depression (PHQ2-9): Not on file  Alcohol Screen: Not on file  Housing: Unknown (01/03/2025)   Epic    Unable to Pay for Housing in the Last Year: No    Number of Times Moved in the Last Year: Not on file    Homeless in the Last Year: No  Utilities: Not At Risk (01/03/2025)   Epic    Threatened with loss of utilities: No  Health Literacy: Not on file    Family History:    Family History  Problem Relation Age of Onset   Hypertension Mother      ROS:  Please see the history of present illness.    Physical Exam/Data: Vitals:   01/04/25 0500 01/04/25 0504 01/04/25 0733 01/04/25 0959  BP:  (!) 102/90  (!) 125/91  Pulse:  99  (!) 105  Resp:  14    Temp:  97.7 F (36.5 C)  97.6 F (36.4 C)  TempSrc:  Oral  Oral  SpO2:  97% 97% 95%  Weight: 69.3 kg     Height:        Intake/Output Summary (Last 24 hours) at 01/04/2025 1132 Last data filed at 01/04/2025 0557 Gross per 24 hour  Intake 318 ml  Output 800 ml  Net -482 ml      01/04/2025    5:00 AM 01/03/2025    1:30 PM 12/11/2024    7:01 PM  Last 3 Weights  Weight (lbs) 152 lb 12.8 oz 147 lb 4.3 oz 144 lb  Weight (kg) 69.31 kg 66.8 kg 65.318 kg     Body mass index is 23.93 kg/m.  General: 39 y.o. female resting comfortably in no acute distress. On 2L of O2 via nasal cannula. HEENT: Normocephalic and atraumatic. Sclera clear.  Neck: Supple. No JVD elevated. Heart: Mildly tachycardic with normal rhythm.  No murmurs, gallops, or rubs.  Lungs: No increased work of breathing. Clear to ausculation bilaterally. No wheezes, rhonchi, or rales.  Abdomen: Mild distended but soft. Extremities: No lower extremity edema.  Skin: Warm and  dry. Neuro: Alert and oriented x3. No focal deficits. Psych: Normal affect. Responds appropriately.   EKG:  The EKG was personally reviewed and demonstrates:  Mild sinus tachycardia, rate 101 bpm, with right axis deviation and abnormal T wave inversions in inferior leads but no significant change compared to prior tracings. Telemetry:  Telemetry was personally reviewed and demonstrates:  Sinus rhythm with rates in the 90s to low 100s at baseline with spikes in the 130s (suspect with ambulation).  Relevant CV Studies:  Echocardiogram 01/03/2025: Impressions;  1. Left ventricular ejection fraction, by estimation, is 65 to 70%. The  left ventricle has normal function. The left ventricle has no regional  wall motion abnormalities. Left ventricular diastolic parameters are  indeterminate. There is the  interventricular septum is flattened in systole and diastole, consistent  with right ventricular pressure and volume overload.   2. Right ventricular systolic function is moderately reduced. The right  ventricular size is mildly enlarged. There is severely elevated pulmonary  artery systolic pressure. The estimated right ventricular systolic  pressure is 64.2 mmHg.   3. Left atrial size was mildly dilated.   4. Right atrial size was moderately dilated.   5. Moderate pericardial effusion without significant inflow velocity  variation, no RA/RV collapse, IVC is normal size but does not collapse.  Pericardium is best seen on subcostal images, measures 0.63 cm adjacent to  RA during diastole, consistent with  significant thickening. Moderate pericardial effusion. The pericardial  effusion is circumferential.   6. The mitral valve is grossly normal. Trivial mitral valve  regurgitation. No evidence of mitral stenosis.   7. The aortic valve is grossly normal. Aortic valve regurgitation is not  visualized. No aortic stenosis is present.   8. The inferior vena cava is normal in size with <50%  respiratory  variability, suggesting right atrial pressure of 8 mmHg.   Conclusion(s)/Recommendation(s): With pericardial thickening, severely  elevated PA pressures, and history of lymphoma treatment (no mantle  radiation that I see, but focal palliative radiation to lung mass in 2017,  plus  multiple rounds of chemotherapy),  concern would be for constriction causing pulmonary hypertension and right  sided heart failure. Tissue doppler signals on current study not well  aligned, but on prior studies appear normal, arguing against restriction.  Cannot exclude other possible  etiologies of pulmonary hypertension as well. Would recommend consulting  advanced heart failure to discuss right heart cath with constriction study  versus alternative workup.    Laboratory Data: High Sensitivity Troponin:  No results for input(s): TROPONINIHS in the last 720 hours.  Recent Labs  Lab 12/11/24 2152 01/02/25 1256  TRNPT <15 16      Chemistry Recent Labs  Lab 01/02/25 1256 01/02/25 1306 01/03/25 0527  NA 134* 134* 134*  K 3.9 3.7 4.6  CL 96* 94* 95*  CO2 26  --  29  GLUCOSE 89 86 143*  BUN 12 12 13   CREATININE 0.90 1.00 1.01*  CALCIUM  8.5*  --  8.2*  GFRNONAA >60  --  >60  ANIONGAP 13  --  10    Recent Labs  Lab 01/02/25 1256 01/03/25 0527  PROT 5.6* 6.1*  ALBUMIN  3.7 3.7  AST 42* 38  ALT 31 34  ALKPHOS 68 70  BILITOT 1.3* 0.8   Lipids No results for input(s): CHOL, TRIG, HDL, LABVLDL, LDLCALC, CHOLHDL in the last 168 hours.  Hematology Recent Labs  Lab 01/02/25 1256 01/02/25 1306 01/03/25 0527  WBC 3.3*  --  4.1  RBC 3.86*  --  4.03  HGB 12.7 13.6 13.1  HCT 37.6 40.0 40.3  MCV 97.4  --  100.0  MCH 32.9  --  32.5  MCHC 33.8  --  32.5  RDW 13.5  --  13.4  PLT 199  --  214   Thyroid  No results for input(s): TSH, FREET4 in the last 168 hours.  BNP Recent Labs  Lab 01/02/25 1256  PROBNP 9,034.0*    DDimer No results for input(s): DDIMER in  the last 168 hours.  Radiology/Studies:  ECHOCARDIOGRAM COMPLETE Result Date: 01/03/2025    ECHOCARDIOGRAM REPORT   Patient Name:   KELCE BOUTON Date of Exam: 01/03/2025 Medical Rec #:  [id number]          Height:       67.0 in Accession #:    [id number]         Weight:       144.0 lb Date of Birth:  1985/12/28         BSA:          1.759 m Patient Age:    38 years           BP:           121/82 mmHg Patient Gender: F                  HR:           93 bpm. Exam Location:  Inpatient Procedure: 2D Echo, Cardiac Doppler and Color Doppler (Both Spectral and Color            Flow Doppler were utilized during procedure). Indications:    CHF I50.9  History:        Patient has prior history of Echocardiogram examinations, most                 recent 09/08/2023.  Sonographer:    Nathanel Devonshire Referring Phys: [id number] DAVID MANUEL ORTIZ IMPRESSIONS  1. Left ventricular ejection fraction, by estimation, is 65 to 70%. The  left ventricle has normal function. The left ventricle has no regional wall motion abnormalities. Left ventricular diastolic parameters are indeterminate. There is the interventricular septum is flattened in systole and diastole, consistent with right ventricular pressure and volume overload.  2. Right ventricular systolic function is moderately reduced. The right ventricular size is mildly enlarged. There is severely elevated pulmonary artery systolic pressure. The estimated right ventricular systolic pressure is 64.2 mmHg.  3. Left atrial size was mildly dilated.  4. Right atrial size was moderately dilated.  5. Moderate pericardial effusion without significant inflow velocity variation, no RA/RV collapse, IVC is normal size but does not collapse. Pericardium is best seen on subcostal images, measures 0.63 cm adjacent to RA during diastole, consistent with significant thickening. Moderate pericardial effusion. The pericardial effusion is circumferential.  6. The mitral valve is grossly normal. Trivial  mitral valve regurgitation. No evidence of mitral stenosis.  7. The aortic valve is grossly normal. Aortic valve regurgitation is not visualized. No aortic stenosis is present.  8. The inferior vena cava is normal in size with <50% respiratory variability, suggesting right atrial pressure of 8 mmHg. Conclusion(s)/Recommendation(s): With pericardial thickening, severely elevated PA pressures, and history of lymphoma treatment (no mantle radiation that I see, but focal palliative radiation to lung mass in 2017, plus multiple rounds of chemotherapy), concern would be for constriction causing pulmonary hypertension and right sided heart failure. Tissue doppler signals on current study not well aligned, but on prior studies appear normal, arguing against restriction. Cannot exclude other possible etiologies of pulmonary hypertension as well. Would recommend consulting advanced heart failure to discuss right heart cath with constriction study versus alternative workup. FINDINGS  Left Ventricle: Left ventricular ejection fraction, by estimation, is 65 to 70%. The left ventricle has normal function. The left ventricle has no regional wall motion abnormalities. The left ventricular internal cavity size was normal in size. There is  no left ventricular hypertrophy. The interventricular septum is flattened in systole and diastole, consistent with right ventricular pressure and volume overload. Left ventricular diastolic parameters are indeterminate. Right Ventricle: The right ventricular size is mildly enlarged. Right vetricular wall thickness was not well visualized. Right ventricular systolic function is moderately reduced. There is severely elevated pulmonary artery systolic pressure. The tricuspid regurgitant velocity is 3.75 m/s, and with an assumed right atrial pressure of 8 mmHg, the estimated right ventricular systolic pressure is 64.2 mmHg. Left Atrium: Left atrial size was mildly dilated. Right Atrium: Right atrial  size was moderately dilated. Pericardium: Moderate pericardial effusion without significant inflow velocity variation, no RA/RV collapse, IVC is normal size but does not collapse. Pericardium is best seen on subcostal images, measures 0.63 cm adjacent to RA during diastole, consistent with significant thickening. A moderately sized pericardial effusion is present. The pericardial effusion is circumferential. Mitral Valve: The mitral valve is grossly normal. Trivial mitral valve regurgitation. No evidence of mitral valve stenosis. Tricuspid Valve: The tricuspid valve is grossly normal. Tricuspid valve regurgitation is mild . No evidence of tricuspid stenosis. Aortic Valve: The aortic valve is grossly normal. Aortic valve regurgitation is not visualized. No aortic stenosis is present. Aortic valve mean gradient measures 2.0 mmHg. Aortic valve peak gradient measures 3.2 mmHg. Aortic valve area, by VTI measures 2.45 cm. Pulmonic Valve: The pulmonic valve was not well visualized. Pulmonic valve regurgitation is trivial. No evidence of pulmonic stenosis. Aorta: The aortic root is normal in size and structure. Venous: The inferior vena cava is normal in size with less than 50%  respiratory variability, suggesting right atrial pressure of 8 mmHg. IAS/Shunts: The atrial septum is grossly normal. Additional Comments: There is a small pleural effusion in both left and right lateral regions.  LEFT VENTRICLE PLAX 2D LVIDd:         3.80 cm     Diastology LVIDs:         2.50 cm     LV e' medial:   4.79 cm/s LV PW:         0.90 cm     LV E/e' medial: 19.9 LV IVS:        0.90 cm LVOT diam:     1.90 cm LV SV:         37 LV SV Index:   21 LVOT Area:     2.84 cm  LV Volumes (MOD) LV vol d, MOD A2C: 41.2 ml LV vol d, MOD A4C: 37.2 ml LV vol s, MOD A2C: 15.3 ml LV vol s, MOD A4C: 10.4 ml LV SV MOD A2C:     25.9 ml LV SV MOD A4C:     37.2 ml LV SV MOD BP:      29.6 ml RIGHT VENTRICLE          IVC RV Basal diam:  4.00 cm  IVC diam: 1.90  cm TAPSE (M-mode): 1.1 cm LEFT ATRIUM             Index        RIGHT ATRIUM           Index LA diam:        2.70 cm 1.54 cm/m   RA Area:     18.20 cm LA Vol (A2C):   48.2 ml 27.41 ml/m  RA Volume:   53.50 ml  30.42 ml/m LA Vol (A4C):   52.4 ml 29.79 ml/m LA Biplane Vol: 53.1 ml 30.19 ml/m  AORTIC VALVE AV Area (Vmax):    2.71 cm AV Area (Vmean):   2.72 cm AV Area (VTI):     2.45 cm AV Vmax:           89.70 cm/s AV Vmean:          56.900 cm/s AV VTI:            0.149 m AV Peak Grad:      3.2 mmHg AV Mean Grad:      2.0 mmHg LVOT Vmax:         85.80 cm/s LVOT Vmean:        54.500 cm/s LVOT VTI:          0.129 m LVOT/AV VTI ratio: 0.87  AORTA Ao Root diam: 2.80 cm MITRAL VALVE                TRICUSPID VALVE MV Area (PHT): 6.67 cm     TR Peak grad:   56.2 mmHg MV E velocity: 95.20 cm/s   TR Vmax:        375.00 cm/s MV A velocity: 110.00 cm/s MV E/A ratio:  0.87         SHUNTS                             Systemic VTI:  0.13 m                             Systemic Diam: 1.90 cm Heidi Bruckner MD Electronically signed  by Heidi Bruckner MD Signature Date/Time: 01/03/2025/9:02:04 PM    Final    MR LIVER W WO CONTRAST Result Date: 01/03/2025 CLINICAL DATA:  Ascites Malignant ascites. Hx of Hodgkin's lymphoma. * Tracking Code: BO * EXAM: MRI ABDOMEN WITHOUT AND WITH CONTRAST TECHNIQUE: Multiplanar multisequence MR imaging of the abdomen was performed both before and after the administration of intravenous contrast. CONTRAST:  6mL GADAVIST  GADOBUTROL  1 MMOL/ML IV SOLN COMPARISON:  CT scan abdomen and pelvis from 01/02/2025. FINDINGS: Technologist noted limited exam. Patient premedicated due to claustrophobia. Patient falling asleep. Lower chest: Markedly enlarged heart. There is small right pleural effusion. No significant left pleural effusion. There are atelectatic changes at the bilateral lung bases, right more than left, better evaluated on the CT angiography chest from yesterday. Hepatobiliary: The  liver is mildly enlarged measuring up to 16.3 cm in length. Noncirrhotic configuration. No focal lesion. The heterogeneous appearance seen on the CT scan abdomen and pelvis from yesterday was likely due to phase of contrast. No intrahepatic or extrahepatic bile duct dilatation. No choledocholithiasis. Gallbladder is physiologically distended. There is mild diffuse gallbladder wall edema, likely secondary to systemic causes. No pericholecystic fat stranding. No bladder calculi. Pancreas: No mass, inflammatory changes or other parenchymal abnormality identified. No main pancreatic duct dilation. Spleen:  Within normal limits in size and appearance. No focal mass. Adrenals/Urinary Tract: Unremarkable adrenal glands. No hydroureteronephrosis. No suspicious renal mass. Stomach/Bowel: Visualized portions within the abdomen are unremarkable. No disproportionate dilation of bowel loops. Vascular/Lymphatic: No pathologically enlarged lymph nodes identified. No abdominal aortic aneurysm demonstrated. There is small amount of ascites. Other:  There is mild-to-moderate anasarca. Musculoskeletal: Redemonstration of altered signal intensity lesions in the T12, L1, L3 and L5 vertebrae, which corresponds to mixed sclerotic/lytic lesions on the recent CT scan. These were previously characterized as osseous metastases. No discrete pathological fracture seen. IMPRESSION: 1. No focal liver lesion seen. Mild hepatomegaly. The heterogeneous appearance seen on the CT scan abdomen and pelvis from yesterday was likely due to phase of contrast. 2. No biliary ductal dilation. No choledocholithiasis. 3. Small ascites. Mild-to-moderate anasarca. 4. Redemonstration of multiple osseous metastases. No discrete pathological fracture. Electronically Signed   By: Ree Molt M.D.   On: 01/03/2025 13:07   CT Angio Chest PE W and/or Wo Contrast Result Date: 01/02/2025 EXAM: CTA of the Chest with contrast for PE 01/02/2025 03:22:03 PM TECHNIQUE:  CTA of the chest was performed after the administration of 100 mL of iohexol  (OMNIPAQUE ) 350 MG/ML injection. Multiplanar reformatted images are provided for review. MIP images are provided for review. Automated exposure control, iterative reconstruction, and/or weight based adjustment of the mA/kV was utilized to reduce the radiation dose to as low as reasonably achievable. COMPARISON: 12/11/2024 CLINICAL HISTORY: Pulmonary embolism (PE) suspected, high prob. FINDINGS: PULMONARY ARTERIES: No pulmonary embolism. Unchanged dilation of the main pulmonary artery measuring up to 3.3 cm, suggestive of unaligned pulmonary arterial hypertension. Turbulent flow related artifact in the right pulmonary artery. MEDIASTINUM: Unchanged cardiomegaly with a moderate to large volume pericardial effusion. Reflux of contrast into the dilated intrahepatic IVC and the hepatic veins, consistent with underlying cardiac dysfunction. Left chest port terminates at the cavoatrial junction. Rightward shift of the cardiomediastinal structures due to right sided volume loss. There is no acute abnormality of the thoracic aorta. LYMPH NODES: No mediastinal, hilar or axillary lymphadenopathy. LUNGS AND PLEURA: Similar perihilar consolidation in the right lung with scattered areas of perihilar consolidation in the left upper lobe and lingula, likely scarring. Small  right pleural effusion is also unchanged. Mild intralobular septal thickening noted within both upper lobes. Regions of air trapping also noted predominantly in the left lung. No pneumothorax. UPPER ABDOMEN: Partially visualized upper abdominal ascites. Mild anasarca. SOFT TISSUES AND BONES: No acute bone or soft tissue abnormality. IMPRESSION: 1. No evidence of pulmonary embolism. 2. Unchanged cardiomegaly with a moderate to large volume pericardial effusion. Reflux of contrast into the dilated intrahepatic IVC and hepatic veins, consistent with cardiac dysfunction. Mild intralobular  septal thickening predominantly noted within both upper lobes , suggesting interstitial edema. 3. Unchanged dilation of the main pulmonary artery measuring up to 3.3 cm, suggestive of pulmonary arterial hypertension. 4. Small right pleural effusion, unchanged. Mild anasarca. Electronically signed by: Rogelia Myers MD MD 01/02/2025 03:37 PM EST RP Workstation: [id number]   CT ABDOMEN PELVIS W CONTRAST Result Date: 01/02/2025 CLINICAL DATA:  Abdominal swelling for 2 weeks. History of lymphoma. EXAM: CT ABDOMEN AND PELVIS WITH CONTRAST TECHNIQUE: Multidetector CT imaging of the abdomen and pelvis was performed using the standard protocol following bolus administration of intravenous contrast. RADIATION DOSE REDUCTION: This exam was performed according to the departmental dose-optimization program which includes automated exposure control, adjustment of the mA and/or kV according to patient size and/or use of iterative reconstruction technique. CONTRAST:  100 mL OMNIPAQUE  IOHEXOL  300 MG/ML SOLN, OMNIPAQUE  IOHEXOL  350 MG/ML SOLN COMPARISON:  PET scan of August 24, 2024. CT scan of February 17, 2021. FINDINGS: Hepatobiliary: Diffusely heterogeneous appearance of hepatic parenchyma is noted. Metastatic disease cannot be excluded. No cholelithiasis is noted, but moderate gallbladder wall thickening is noted which may be due to adjacent hepatocellular disease or ascites. No biliary dilatation is noted. Pancreas: Unremarkable. No pancreatic ductal dilatation or surrounding inflammatory changes. Spleen: Normal in size without focal abnormality. Adrenals/Urinary Tract: Adrenal glands are unremarkable. Kidneys are normal, without renal calculi, focal lesion, or hydronephrosis. Bladder is unremarkable. Stomach/Bowel: Stomach is within normal limits. Appendix appears normal. No evidence of bowel wall thickening, distention, or inflammatory changes. Vascular/Lymphatic: No significant vascular findings are present. No  enlarged abdominal or pelvic lymph nodes. Reproductive: Uterus and bilateral adnexa are unremarkable. Other: Mild ascites is noted.  No definite hernia is noted. Musculoskeletal: Ill-defined sclerotic densities are again noted in the spine and pelvis concerning for metastatic disease. IMPRESSION: 1. Diffusely heterogeneous appearance of hepatic parenchyma is noted concerning for metastatic disease. MRI is recommended for further evaluation. 2. Mild ascites is noted. 3. Moderate gallbladder wall thickening is noted which may be due to adjacent hepatocellular disease or ascites. 4. Ill-defined sclerotic densities are again noted in the spine and pelvis concerning for metastatic disease. Electronically Signed   By: Lynwood Landy Raddle M.D.   On: 01/02/2025 15:34   DG Chest Port 1 View Result Date: 01/02/2025 EXAM: 1 VIEW(S) XRAY OF THE CHEST 01/02/2025 01:01:00 PM COMPARISON: 12/11/2024 CLINICAL HISTORY: shob FINDINGS: LINES, TUBES AND DEVICES: Left Port-A-Cath in place with tip overlying the expected region of the superior cavoatrial junction. LUNGS AND PLEURA: Mild bilateral interstitial opacities with similar perihilar consolidation on the right with chronic blunting of the right costophrenic sulcus and elevation of the right hemidiaphragm. This likely represents a combination of scarring and small right pleural effusion. Perihilar reticular opacity is also noted on the lef also likely a combination of subsegmental atelectasis and scarring. t. No pneumothorax. HEART AND MEDIASTINUM: Similar enlargement of the cardiac silhouette. BONES AND SOFT TISSUES: No acute osseous abnormality. IMPRESSION: 1. Similar enlargement of the cardiac silhouette, which was previously  noted to be a combination of cardiomegaly and moderate pericardial effusion. No pneumonia or pulmonary edema. 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 Pro BNP was elevated in the 9,000s. Chest CTA was negative for PE but showed unchanged cardiomegaly with a moderate to large pericardial effusion, reflux of contrast into the dilated intrahepatic IVC and hepatic veins consistent with cardiac dysfunction, mild intralobular septal thickening suggesting interstitial edema, unchanged dilatation of the main pulmonary artery, small right pericardial effusion, and mild anasarca. Echo showed LVEF of 65-70% with with no regional wall motion abnormalities, moderately reduced RV function with severely elevated PASP of 64.2 mmHg and a flattened interventricular septum in systole and diastole consistent with RV pressure and volume overload, and a moderate pericardial effusion. Looking back in records she has had a moderate pericardial effusion dating back to 06/2023 and was also noted to have RV dysfunction on Echo in 08/2023. 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 She is feeling better with diuresis. Symptoms have improved with diuresis. We initially discussed keeping her over the weekend for ongoing diuresis and then proceeding with a RHC on Monday. However, patient is very eager to go home and would prefer to have this done as an outpatient. Will keep her at least another night and then have her follow-up with the Advanced [facility]F clinic for further evaluation and RHC. I was able to get her an appointment with Dr. Cherrie on 01/22/2025.   RV Failure Severe Pulmonary Hypertension Echo this admission showed normal LV function with moderately reduced RV function with severely elevated PASP of 64.2 mmHg and a flattened interventricular septum in systole and diastole consistent with RV pressure and volume overload. Etiology unclear. CTA was negative for PE. Possibly  secondary to constrictive pericarditis. Will check ANA, RF, and Sed Rate.  She is currently on IV Lasix  20mg  twice daily. We can switch to PO Lasix  40mg  daily tomorrow. Will ultimately need RHC. Patient would prefer to have this done as an outpatient so plan is to have her follow up in the Advanced CHF clinic as above.  Hypertension Patient has a history of hypertension listed in her chart but she denies this. Diastolic BP mildly elevated at times. She is not on any medications at home but was started on Lisinopril . She was reported feeling a lightheaded/ dizzy when standing with this, so will stop this.  Otherwise, per primary team; - Hodgkin's lymphoma - Asthma - Tobacco abuse  Risk Assessment/Risk Scores:   [place]  Heart Association (NYHA) Functional Class NYHA Class II  For questions or updates, please contact [facility] Please consult [email or web address] for contact info under   Signed, Callie E Goodrich, PA-C  01/04/2025 11:32 AM      [1]  Allergies Allergen Reactions   Doxorubicin  Hcl Liposomal Anaphylaxis, Shortness Of Breath and Other (See Comments)    Pt had severe hypersensitivity reaction with chest pain and SOB immediately following start of medication. See hypersensitivity note from 07/14/2023.   "

## 2025-01-04 NOTE — Progress Notes (Signed)
" °   01/04/25 1446  Assess: MEWS Score  Temp 98.3 F (36.8 C)  BP (!) 120/98  MAP (mmHg) 107  Pulse Rate (!) 17  Resp 20  SpO2 95 %  O2 Device Nasal Cannula  O2 Flow Rate (L/min) 3 L/min  Assess: MEWS Score  MEWS Temp 0  MEWS Systolic 0  MEWS Pulse 2  MEWS RR 0  MEWS LOC 0  MEWS Score 2  MEWS Score Color Yellow  Assess: if the MEWS score is Yellow or Red  Were vital signs accurate and taken at a resting state? Yes  Does the patient meet 2 or more of the SIRS criteria? No  MEWS guidelines implemented  Yes, yellow  Treat  MEWS Interventions Considered administering scheduled or prn medications/treatments as ordered  Take Vital Signs  Increase Vital Sign Frequency  Yellow: Q2hr x1, continue Q4hrs until patient remains green for 12hrs  Escalate  MEWS: Escalate Yellow: Discuss with charge nurse and consider notifying provider and/or RRT  Notify: Charge Nurse/RN  Name of Charge Nurse/RN Notified Augustin Combes, RN  Provider Notification  Provider Name/Title Casimer Dare, MD  Date Provider Notified 01/04/25  Time Provider Notified 1500  Method of Notification Page  Notification Reason Change in status  Provider response No new orders  Date of Provider Response 01/04/25  Time of Provider Response 1500  Assess: SIRS CRITERIA  SIRS Temperature  0  SIRS Respirations  0  SIRS Pulse 0  SIRS WBC 0  SIRS Score Sum  0    "

## 2025-01-04 NOTE — TOC Initial Note (Signed)
 Transition of Care [facility]) - Initial/Assessment Note    Patient Details  Name: Heidi [name] MRN: [id number] Date of Birth: 02-01-1986  [facility] CM/SW Contact:    Bascom Service, RN Phone Number: 01/04/2025, 11:05 AM  Clinical Narrative: d/c plan home. Has PCP,pharmacy. On 02-monitor if needed can arrange.Has own transport home.                  Expected Discharge Plan: Home/Self Care Barriers to Discharge: Continued Medical Work up   Patient Goals and CMS Choice Patient states their goals for this hospitalization and ongoing recovery are:: Home CMS Medicare.gov Compare Post Acute Care list provided to:: Patient Represenative (must comment) (Theresa(mother)) Choice offered to / list presented to : Parent      Expected Discharge Plan and Services   Discharge Planning Services: CM Consult   Living arrangements for the past 2 months: Apartment                                      Prior Living Arrangements/Services Living arrangements for the past 2 months: Apartment Lives with:: Relatives                   Activities of Daily Living      Permission Sought/Granted                  Emotional Assessment              Admission diagnosis:  Volume overload [E87.70] Cirrhosis of liver with ascites, unspecified hepatic cirrhosis type (HCC) [K74.60, R18.8] Acute congestive heart failure, unspecified heart failure type (HCC) [I50.9] Acute hypoxic respiratory failure (HCC) [J96.01] Patient Active Problem List   Diagnosis Date Noted   Leukopenia 01/02/2025   Hypocalcemia 01/02/2025   Liver mass 01/02/2025   Hyperbilirubinemia 01/02/2025   Abnormal LFTs (liver function tests) 01/02/2025   Metastasis to bone (HCC) 01/02/2025   Ascites, malignant (HCC) 01/02/2025   Asthma exacerbation 01/02/2025   Pericardial effusion 09/10/2023   Pleural effusion 09/10/2023   Thrombocytopenia 09/10/2023   Acute hypoxic respiratory failure (HCC)  09/08/2023   Post herpetic neuralgia    Hodgkin's lymphoma (HCC) 11/14/2018   Herpes zoster without complication    Counseling regarding advance care planning and goals of care 10/09/2018   Hodgkin's disease in adult [facility]) 10/09/2018   Port-A-Cath in place 09/28/2018   Pruritus 01/20/2017   Central line complication    Deep vein thrombosis (DVT) of upper extremity (HCC)    SVC syndrome    Dyspnea 12/14/2016   Swelling 12/14/2016   Symptomatic anemia 11/26/2016   CAP (community acquired pneumonia) 11/23/2016   PNA (pneumonia) 11/23/2016   Hyperpigmentation 06/04/2016   Hypersensitivity reaction 05/04/2016   Encounter for antineoplastic chemotherapy    Hypokalemia 04/20/2016   Volume overload 04/20/2016   S/P bronchoscopy with biopsy    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)    HCAP (healthcare-associated pneumonia) 04/10/2016   Hyponatremia 04/10/2016   Thrombocytosis 04/10/2016   Pneumonitis 03/16/2016   Postobstructive pneumonia 03/16/2016   Acute respiratory failure with hypoxia (HCC) 03/16/2016   Leukocytosis 03/16/2016   Sepsis due to pneumonia (HCC) 03/16/2016   Lung mass    Lymphadenopathy    PCP:  Pcp, No Pharmacy:   [id number] - [place], [place] - [address] AT [address] [place] [address] Phone: [phone] Fax: [phone]     Social Drivers of Health (SDOH) Social History: SDOH Screenings   Food Insecurity: No Food Insecurity (01/03/2025)  Housing: Unknown (01/03/2025)  Transportation Needs: No Transportation Needs (01/03/2025)  Utilities: Not At Risk (01/03/2025)  Tobacco Use: High Risk (01/02/2025)   SDOH Interventions:     Readmission Risk Interventions    09/09/2023   12:37 PM  Readmission Risk Prevention Plan  Transportation Screening Complete  PCP or Specialist Appt within 3-5 Days Complete  HRI or Home Care Consult Complete  Social Work Consult for  Recovery Care Planning/Counseling Complete  Palliative Care Screening Not Applicable  Medication Review Oceanographer) Complete

## 2025-01-04 NOTE — Plan of Care (Signed)
   Problem: Health Behavior/Discharge Planning: Goal: Ability to manage health-related needs will improve Outcome: Progressing   Problem: Clinical Measurements: Goal: Will remain free from infection Outcome: Progressing   Problem: Clinical Measurements: Goal: Respiratory complications will improve Outcome: Progressing

## 2025-01-04 NOTE — Progress Notes (Signed)
 Heart Failure Navigator Progress Note  Assessed for Heart & Vascular TOC clinic readiness.  Patient does not meet criteria due to per MD note patient has a scheduled Advanced Heart Failure Team appointment with Dr. Cherrie on 01/22/2025. .   Navigator will sign off at this time.   Stephane Haddock, BSN, Scientist, Clinical (histocompatibility And Immunogenetics) Only

## 2025-01-04 NOTE — Progress Notes (Signed)
 " PROGRESS NOTE    Heidi [name]  FMW:[id number] DOB: [birth year] DOA: 01/02/2025 PCP: Pcp, No  Subjective: Patient reports her abdominal distension and leg swelling is significantly improved, breathing is also better but remains on 2L O2   Hospital Course: 39 year old female with PMH of asthma, Hodgkin's lymphoma, noncompliance, pericardial effusion who presented to the ED with worsening abdominal distention, shortness of breath. CT abdomen/pelvis with contrast showed diffusely heterogeneous appearance of the hepatic parenchyma, concerning for metastatic disease and MRI was recommended. She was given IV Lasix  and admitted to medicine. Her TTE showed moderately reduced RV systolic function, RV size mildly enlarged, and severe elevated pulmonary systolic pressure, and moderate pericardial effusion without tamponade physiology.  Heart failure team was consulted   Assessment and Plan:  Acute hypoxic respiratory failure - most likely from volume overload with elevated BNP, ascites, and anasarca. CTA chest was negative for PE. TTE showed elevated RV filling pressures and severe pulmonary HTN - per RN, her O2 sats dropped to 70% on room air and she was placed back on 3L O2  Pericardial effusion - moderate on TTE, no tamponade physiology. 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 MRI completed and no focal liver lesion seen (the appearance on the CT was likely due to phase of contrast) - f/u with Dr. Onesimo as routine    Bone mets - due to Hodgkin's disease - has been stable per last PET scan - f/u with oncology    Leukopenia - resolved    ?HTN - per patient no prior diagnosis and lisinopril  was discontinued, she is normotensive      DVT prophylaxis: enoxaparin  (LOVENOX ) injection 40 mg Start: 01/02/25 2200    Code Status: Full Code Disposition Plan: Home Reason for continuing need for hospitalization: O2 needs  Objective: Vitals:   01/04/25 0504 01/04/25 0733 01/04/25 0959 01/04/25 1200  BP: (!) 102/90  (!) 125/91   Pulse: 99  (!) 105   Resp: 14     Temp: 97.7 F (36.5 C)  97.6 F (36.4 C)   TempSrc: Oral  Oral   SpO2: 97% 97% 95% 95%  Weight:      Height:        Intake/Output Summary (Last 24 hours) at 01/04/2025 1444 Last data filed at 01/04/2025 0900 Gross per 24 hour  Intake 418 ml  Output 800 ml  Net -382 ml   Filed Weights   01/03/25 1330 01/04/25 0500  Weight: 66.8 kg 69.3 kg    Examination:  Physical Exam Vitals and nursing note reviewed.  Constitutional:      Comments: On 2L [place]  Cardiovascular:  Rate and Rhythm: Normal rate.  Pulmonary:     Effort: No respiratory distress.     Breath sounds: No wheezing.  Abdominal:     General: There is no distension.     Palpations: Abdomen is soft.     Tenderness: There is no abdominal tenderness.  Musculoskeletal:     Right lower leg: No edema.     Left lower leg: No edema.     Data Reviewed: I have personally reviewed following labs and imaging studies  CBC: Recent Labs  Lab 01/02/25 1256 01/02/25 1306  01/03/25 0527  WBC 3.3*  --  4.1  NEUTROABS 2.6  --   --   HGB 12.7 13.6 13.1  HCT 37.6 40.0 40.3  MCV 97.4  --  100.0  PLT 199  --  214   Basic Metabolic Panel: Recent Labs  Lab 01/02/25 1256 01/02/25 1306 01/03/25 0527  NA 134* 134* 134*  K 3.9 3.7 4.6  CL 96* 94* 95*  CO2 26  --  29  GLUCOSE 89 86 143*  BUN 12 12 13   CREATININE 0.90 1.00 1.01*  CALCIUM  8.5*  --  8.2*   GFR: Estimated Creatinine Clearance: 72.7 mL/min (A) (by C-G formula based on SCr of 1.01 mg/dL (H)). Liver Function Tests: Recent Labs  Lab 01/02/25 1256 01/03/25 0527  AST 42* 38  ALT 31 34  ALKPHOS 68 70  BILITOT 1.3* 0.8  PROT 5.6* 6.1*  ALBUMIN  3.7 3.7   Recent Labs  Lab 01/02/25 1256  LIPASE 18   No results for input(s): AMMONIA in the last 168 hours. Coagulation Profile: No results for input(s): INR, PROTIME in the last 168 hours. Cardiac Enzymes: No results for input(s): CKTOTAL, CKMB, CKMBINDEX, TROPONINI in the last 168 hours. ProBNP, BNP (last 5 results) Recent Labs    01/02/25 1256  PROBNP 9,034.0*   HbA1C: No results for input(s): HGBA1C in the last 72 hours. CBG: No results for input(s): GLUCAP in the last 168 hours. Lipid Profile: No results for input(s): CHOL, HDL, LDLCALC, TRIG, CHOLHDL, LDLDIRECT in the last 72 hours. Thyroid  Function Tests: No results for input(s): TSH, T4TOTAL, FREET4, T3FREE, THYROIDAB in the last 72 hours. Anemia Panel: No results for input(s): VITAMINB12, FOLATE, FERRITIN, TIBC, IRON, RETICCTPCT in the last 72 hours. Sepsis Labs: No results for input(s): PROCALCITON, LATICACIDVEN in the last 168 hours.  No results found for this or any previous visit (from the past 240 hours).   Radiology Studies: ECHOCARDIOGRAM COMPLETE Result Date: 01/03/2025    ECHOCARDIOGRAM REPORT   Patient Name:   Heidi [name] Date of Exam: 01/03/2025 Medical Rec #:  [id number]          Height:       67.0 in  Accession #:    [id number]         Weight:       144.0 lb Date of Birth:  1986-03-08         BSA:          1.759 m Patient Age:    38 years           BP:           121/82 mmHg Patient Gender: F                  HR:           93 bpm. Exam Location:  Inpatient Procedure: 2D Echo, Cardiac Doppler and Color Doppler (Both Spectral and Color  Flow Doppler were utilized during procedure). Indications:    CHF I50.9  History:        Patient has prior history of Echocardiogram examinations, most                 recent 09/08/2023.  Sonographer:    Nathanel Devonshire Referring Phys: [id number] DAVID MANUEL ORTIZ IMPRESSIONS  1. Left ventricular ejection fraction, by estimation, is 65 to 70%. The left ventricle has normal function. The left ventricle has no regional wall motion abnormalities. Left ventricular diastolic parameters are indeterminate. There is the interventricular septum is flattened in systole and diastole, consistent with right ventricular pressure and volume overload.  2. Right ventricular systolic function is moderately reduced. The right ventricular size is mildly enlarged. There is severely elevated pulmonary artery systolic pressure. The estimated right ventricular systolic pressure is 64.2 mmHg.  3. Left atrial size was mildly dilated.  4. Right atrial size was moderately dilated.  5. Moderate pericardial effusion without significant inflow velocity variation, no RA/RV collapse, IVC is normal size but does not collapse. Pericardium is best seen on subcostal images, measures 0.63 cm adjacent to RA during diastole, consistent with significant thickening. Moderate pericardial effusion. The pericardial effusion is circumferential.  6. The mitral valve is grossly normal. Trivial mitral valve regurgitation. No evidence of mitral stenosis.  7. The aortic valve is grossly normal. Aortic valve regurgitation is not visualized. No aortic stenosis is present.  8. The inferior vena cava is normal in size with <50%  respiratory variability, suggesting right atrial pressure of 8 mmHg. Conclusion(s)/Recommendation(s): With pericardial thickening, severely elevated PA pressures, and history of lymphoma treatment (no mantle radiation that I see, but focal palliative radiation to lung mass in 2017, plus multiple rounds of chemotherapy), concern would be for constriction causing pulmonary hypertension and right sided heart failure. Tissue doppler signals on current study not well aligned, but on prior studies appear normal, arguing against restriction. Cannot exclude other possible etiologies of pulmonary hypertension as well. Would recommend consulting advanced heart failure to discuss right heart cath with constriction study versus alternative workup. FINDINGS  Left Ventricle: Left ventricular ejection fraction, by estimation, is 65 to 70%. The left ventricle has normal function. The left ventricle has no regional wall motion abnormalities. The left ventricular internal cavity size was normal in size. There is  no left ventricular hypertrophy. The interventricular septum is flattened in systole and diastole, consistent with right ventricular pressure and volume overload. Left ventricular diastolic parameters are indeterminate. Right Ventricle: The right ventricular size is mildly enlarged. Right vetricular wall thickness was not well visualized. Right ventricular systolic function is moderately reduced. There is severely elevated pulmonary artery systolic pressure. The tricuspid regurgitant velocity is 3.75 m/s, and with an assumed right atrial pressure of 8 mmHg, the estimated right ventricular systolic pressure is 64.2 mmHg. Left Atrium: Left atrial size was mildly dilated. Right Atrium: Right atrial size was moderately dilated. Pericardium: Moderate pericardial effusion without significant inflow velocity variation, no RA/RV collapse, IVC is normal size but does not collapse. Pericardium is best seen on subcostal images,  measures 0.63 cm adjacent to RA during diastole, consistent with significant thickening. A moderately sized pericardial effusion is present. The pericardial effusion is circumferential. Mitral Valve: The mitral valve is grossly normal. Trivial mitral valve regurgitation. No evidence of mitral valve stenosis. Tricuspid Valve: The tricuspid valve is grossly normal. Tricuspid valve regurgitation is mild . No evidence of tricuspid stenosis. Aortic Valve: The aortic valve is grossly normal. Aortic  valve regurgitation is not visualized. No aortic stenosis is present. Aortic valve mean gradient measures 2.0 mmHg. Aortic valve peak gradient measures 3.2 mmHg. Aortic valve area, by VTI measures 2.45 cm. Pulmonic Valve: The pulmonic valve was not well visualized. Pulmonic valve regurgitation is trivial. No evidence of pulmonic stenosis. Aorta: The aortic root is normal in size and structure. Venous: The inferior vena cava is normal in size with less than 50% respiratory variability, suggesting right atrial pressure of 8 mmHg. IAS/Shunts: The atrial septum is grossly normal. Additional Comments: There is a small pleural effusion in both left and right lateral regions.  LEFT VENTRICLE PLAX 2D LVIDd:         3.80 cm     Diastology LVIDs:         2.50 cm     LV e' medial:   4.79 cm/s LV PW:         0.90 cm     LV E/e' medial: 19.9 LV IVS:        0.90 cm LVOT diam:     1.90 cm LV SV:         37 LV SV Index:   21 LVOT Area:     2.84 cm  LV Volumes (MOD) LV vol d, MOD A2C: 41.2 ml LV vol d, MOD A4C: 37.2 ml LV vol s, MOD A2C: 15.3 ml LV vol s, MOD A4C: 10.4 ml LV SV MOD A2C:     25.9 ml LV SV MOD A4C:     37.2 ml LV SV MOD BP:      29.6 ml RIGHT VENTRICLE          IVC RV Basal diam:  4.00 cm  IVC diam: 1.90 cm TAPSE (M-mode): 1.1 cm LEFT ATRIUM             Index        RIGHT ATRIUM           Index LA diam:        2.70 cm 1.54 cm/m   RA Area:     18.20 cm LA Vol (A2C):   48.2 ml 27.41 ml/m  RA Volume:   53.50 ml  30.42 ml/m LA  Vol (A4C):   52.4 ml 29.79 ml/m LA Biplane Vol: 53.1 ml 30.19 ml/m  AORTIC VALVE AV Area (Vmax):    2.71 cm AV Area (Vmean):   2.72 cm AV Area (VTI):     2.45 cm AV Vmax:           89.70 cm/s AV Vmean:          56.900 cm/s AV VTI:            0.149 m AV Peak Grad:      3.2 mmHg AV Mean Grad:      2.0 mmHg LVOT Vmax:         85.80 cm/s LVOT Vmean:        54.500 cm/s LVOT VTI:          0.129 m LVOT/AV VTI ratio: 0.87  AORTA Ao Root diam: 2.80 cm MITRAL VALVE                TRICUSPID VALVE MV Area (PHT): 6.67 cm     TR Peak grad:   56.2 mmHg MV E velocity: 95.20 cm/s   TR Vmax:        375.00 cm/s MV A velocity: 110.00 cm/s MV E/A ratio:  0.87  SHUNTS                             Systemic VTI:  0.13 m                             Systemic Diam: 1.90 cm Shelda Bruckner MD Electronically signed by Shelda Bruckner MD Signature Date/Time: 01/03/2025/9:02:04 PM    Final    MR LIVER W WO CONTRAST Result Date: 01/03/2025 CLINICAL DATA:  Ascites Malignant ascites. Hx of Hodgkin's lymphoma. * Tracking Code: BO * EXAM: MRI ABDOMEN WITHOUT AND WITH CONTRAST TECHNIQUE: Multiplanar multisequence MR imaging of the abdomen was performed both before and after the administration of intravenous contrast. CONTRAST:  6mL GADAVIST  GADOBUTROL  1 MMOL/ML IV SOLN COMPARISON:  CT scan abdomen and pelvis from 01/02/2025. FINDINGS: Technologist noted limited exam. Patient premedicated due to claustrophobia. Patient falling asleep. Lower chest: Markedly enlarged heart. There is small right pleural effusion. No significant left pleural effusion. There are atelectatic changes at the bilateral lung bases, right more than left, better evaluated on the CT angiography chest from yesterday. Hepatobiliary: The liver is mildly enlarged measuring up to 16.3 cm in length. Noncirrhotic configuration. No focal lesion. The heterogeneous appearance seen on the CT scan abdomen and pelvis from yesterday was likely due to phase of contrast. No  intrahepatic or extrahepatic bile duct dilatation. No choledocholithiasis. Gallbladder is physiologically distended. There is mild diffuse gallbladder wall edema, likely secondary to systemic causes. No pericholecystic fat stranding. No bladder calculi. Pancreas: No mass, inflammatory changes or other parenchymal abnormality identified. No main pancreatic duct dilation. Spleen:  Within normal limits in size and appearance. No focal mass. Adrenals/Urinary Tract: Unremarkable adrenal glands. No hydroureteronephrosis. No suspicious renal mass. Stomach/Bowel: Visualized portions within the abdomen are unremarkable. No disproportionate dilation of bowel loops. Vascular/Lymphatic: No pathologically enlarged lymph nodes identified. No abdominal aortic aneurysm demonstrated. There is small amount of ascites. Other:  There is mild-to-moderate anasarca. Musculoskeletal: Redemonstration of altered signal intensity lesions in the T12, L1, L3 and L5 vertebrae, which corresponds to mixed sclerotic/lytic lesions on the recent CT scan. These were previously characterized as osseous metastases. No discrete pathological fracture seen. IMPRESSION: 1. No focal liver lesion seen. Mild hepatomegaly. The heterogeneous appearance seen on the CT scan abdomen and pelvis from yesterday was likely due to phase of contrast. 2. No biliary ductal dilation. No choledocholithiasis. 3. Small ascites. Mild-to-moderate anasarca. 4. Redemonstration of multiple osseous metastases. No discrete pathological fracture. Electronically Signed   By: Ree Molt M.D.   On: 01/03/2025 13:07   CT Angio Chest PE W and/or Wo Contrast Result Date: 01/02/2025 EXAM: CTA of the Chest with contrast for PE 01/02/2025 03:22:03 PM TECHNIQUE: CTA of the chest was performed after the administration of 100 mL of iohexol  (OMNIPAQUE ) 350 MG/ML injection. Multiplanar reformatted images are provided for review. MIP images are provided for review. Automated exposure control,  iterative reconstruction, and/or weight based adjustment of the mA/kV was utilized to reduce the radiation dose to as low as reasonably achievable. COMPARISON: 12/11/2024 CLINICAL HISTORY: Pulmonary embolism (PE) suspected, high prob. FINDINGS: PULMONARY ARTERIES: No pulmonary embolism. Unchanged dilation of the main pulmonary artery measuring up to 3.3 cm, suggestive of unaligned pulmonary arterial hypertension. Turbulent flow related artifact in the right pulmonary artery. MEDIASTINUM: Unchanged cardiomegaly with a moderate to large volume pericardial effusion. Reflux of contrast into the dilated intrahepatic IVC and  the hepatic veins, consistent with underlying cardiac dysfunction. Left chest port terminates at the cavoatrial junction. Rightward shift of the cardiomediastinal structures due to right sided volume loss. There is no acute abnormality of the thoracic aorta. LYMPH NODES: No mediastinal, hilar or axillary lymphadenopathy. LUNGS AND PLEURA: Similar perihilar consolidation in the right lung with scattered areas of perihilar consolidation in the left upper lobe and lingula, likely scarring. Small right pleural effusion is also unchanged. Mild intralobular septal thickening noted within both upper lobes. Regions of air trapping also noted predominantly in the left lung. No pneumothorax. UPPER ABDOMEN: Partially visualized upper abdominal ascites. Mild anasarca. SOFT TISSUES AND BONES: No acute bone or soft tissue abnormality. IMPRESSION: 1. No evidence of pulmonary embolism. 2. Unchanged cardiomegaly with a moderate to large volume pericardial effusion. Reflux of contrast into the dilated intrahepatic IVC and hepatic veins, consistent with cardiac dysfunction. Mild intralobular septal thickening predominantly noted within both upper lobes , suggesting interstitial edema. 3. Unchanged dilation of the main pulmonary artery measuring up to 3.3 cm, suggestive of pulmonary arterial hypertension. 4. Small right  pleural effusion, unchanged. Mild anasarca. Electronically signed by: Rogelia Myers MD MD 01/02/2025 03:37 PM EST RP Workstation: [id number]   CT ABDOMEN PELVIS W CONTRAST Result Date: 01/02/2025 CLINICAL DATA:  Abdominal swelling for 2 weeks. History of lymphoma. EXAM: CT ABDOMEN AND PELVIS WITH CONTRAST TECHNIQUE: Multidetector CT imaging of the abdomen and pelvis was performed using the standard protocol following bolus administration of intravenous contrast. RADIATION DOSE REDUCTION: This exam was performed according to the departmental dose-optimization program which includes automated exposure control, adjustment of the mA and/or kV according to patient size and/or use of iterative reconstruction technique. CONTRAST:  100 mL OMNIPAQUE  IOHEXOL  300 MG/ML SOLN, OMNIPAQUE  IOHEXOL  350 MG/ML SOLN COMPARISON:  PET scan of August 24, 2024. CT scan of February 17, 2021. FINDINGS: Hepatobiliary: Diffusely heterogeneous appearance of hepatic parenchyma is noted. Metastatic disease cannot be excluded. No cholelithiasis is noted, but moderate gallbladder wall thickening is noted which may be due to adjacent hepatocellular disease or ascites. No biliary dilatation is noted. Pancreas: Unremarkable. No pancreatic ductal dilatation or surrounding inflammatory changes. Spleen: Normal in size without focal abnormality. Adrenals/Urinary Tract: Adrenal glands are unremarkable. Kidneys are normal, without renal calculi, focal lesion, or hydronephrosis. Bladder is unremarkable. Stomach/Bowel: Stomach is within normal limits. Appendix appears normal. No evidence of bowel wall thickening, distention, or inflammatory changes. Vascular/Lymphatic: No significant vascular findings are present. No enlarged abdominal or pelvic lymph nodes. Reproductive: Uterus and bilateral adnexa are unremarkable. Other: Mild ascites is noted.  No definite hernia is noted. Musculoskeletal: Ill-defined sclerotic densities are again noted in the  spine and pelvis concerning for metastatic disease. IMPRESSION: 1. Diffusely heterogeneous appearance of hepatic parenchyma is noted concerning for metastatic disease. MRI is recommended for further evaluation. 2. Mild ascites is noted. 3. Moderate gallbladder wall thickening is noted which may be due to adjacent hepatocellular disease or ascites. 4. Ill-defined sclerotic densities are again noted in the spine and pelvis concerning for metastatic disease. Electronically Signed   By: Lynwood Landy Raddle M.D.   On: 01/02/2025 15:34    Scheduled Meds:  budesonide  (PULMICORT ) nebulizer solution  0.25 mg Nebulization BID   Chlorhexidine  Gluconate Cloth  6 each Topical Daily   enoxaparin  (LOVENOX ) injection  40 mg Subcutaneous Q24H   furosemide   20 mg Intravenous BID   [START ON 01/05/2025] furosemide   40 mg Oral Daily   ipratropium-albuterol   3 mL Nebulization QID  sodium chloride  flush  10-40 mL Intracatheter Q12H   Continuous Infusions:   LOS: 1 day   Time spent: 40 minutes  Casimer Dare, MD  [facility]  01/04/2025, 2:44 PM   "

## 2025-01-05 LAB — BASIC METABOLIC PANEL WITH GFR
Anion gap: 7 (ref 5–15)
BUN: 12 mg/dL (ref 6–20)
CO2: 34 mmol/L — ABNORMAL HIGH (ref 22–32)
Calcium: 8.5 mg/dL — ABNORMAL LOW (ref 8.9–10.3)
Chloride: 96 mmol/L — ABNORMAL LOW (ref 98–111)
Creatinine, Ser: 0.95 mg/dL (ref 0.44–1.00)
GFR, Estimated: >60 mL/min
Glucose, Bld: 99 mg/dL (ref 70–99)
Potassium: 3.7 mmol/L (ref 3.5–5.1)
Sodium: 137 mmol/L (ref 135–145)

## 2025-01-05 LAB — CBC
HCT: 39.9 % (ref 36.0–46.0)
Hemoglobin: 12.7 g/dL (ref 12.0–15.0)
MCH: 32.2 pg (ref 26.0–34.0)
MCHC: 31.8 g/dL (ref 30.0–36.0)
MCV: 101.3 fL — ABNORMAL HIGH (ref 80.0–100.0)
Platelets: 216 K/uL (ref 150–400)
RBC: 3.94 MIL/uL (ref 3.87–5.11)
RDW: 13.3 % (ref 11.5–15.5)
WBC: 5.2 K/uL (ref 4.0–10.5)
nRBC: 0 % (ref 0.0–0.2)

## 2025-01-05 LAB — ANA W/REFLEX IF POSITIVE: Anti Nuclear Antibody (ANA): NEGATIVE

## 2025-01-05 LAB — RHEUMATOID FACTOR: Rheumatoid fact SerPl-aCnc: <10 [IU]/mL

## 2025-01-05 LAB — MAGNESIUM: Magnesium: 1.9 mg/dL (ref 1.7–2.4)

## 2025-01-05 MED ORDER — INFLUENZA VIRUS VACC SPLIT PF (FLUZONE) 0.5 ML IM SUSY
0.5000 mL | PREFILLED_SYRINGE | INTRAMUSCULAR | Status: AC
  Administered 2025-01-08: 0.5 mL via INTRAMUSCULAR
  Filled 2025-01-05: qty 0.5

## 2025-01-05 NOTE — H&P (View-Only) (Signed)
 "  Cardiology:  [facility]  Subjective:  Still with dyspnea does not feel well   Objective:  Vitals:   01/04/25 2006 01/05/25 0114 01/05/25 0441 01/05/25 0750  BP: 122/85 106/83 104/71   Pulse: (!) 110 (!) 110 (!) 113   Resp: 16 (!) 22 20   Temp: 97.9 F (36.6 C) 97.7 F (36.5 C) 98 F (36.7 C)   TempSrc: Oral Oral Oral   SpO2: 97% 96% 95% 98%  Weight:   67.9 kg   Height:        Intake/Output from previous day:  Intake/Output Summary (Last 24 hours) at 01/05/2025 0959 Last data filed at 01/05/2025 0300 Gross per 24 hour  Intake 1310 ml  Output 1800 ml  Net -490 ml    Physical Exam:  Chronically ill black female JVP elevated no Kusmal's sign Lungs clear Abdomen benign Plus one edema  Lab Results: Basic Metabolic Panel: Recent Labs    01/03/25 0527 01/05/25 0344  NA 134* 137  K 4.6 3.7  CL 95* 96*  CO2 29 34*  GLUCOSE 143* 99  BUN 13 12  CREATININE 1.01* 0.95  CALCIUM  8.2* 8.5*  MG  --  1.9   Liver Function Tests: Recent Labs    01/02/25 1256 01/03/25 0527  AST 42* 38  ALT 31 34  ALKPHOS 68 70  BILITOT 1.3* 0.8  PROT 5.6* 6.1*  ALBUMIN  3.7 3.7   Recent Labs    01/02/25 1256  LIPASE 18   CBC: Recent Labs    01/02/25 1256 01/02/25 1306 01/03/25 0527 01/05/25 0344  WBC 3.3*  --  4.1 5.2  NEUTROABS 2.6  --   --   --   HGB 12.7   < > 13.1 12.7  HCT 37.6   < > 40.3 39.9  MCV 97.4  --  100.0 101.3*  PLT 199  --  214 216   < > = values in this interval not displayed.    Imaging: ECHOCARDIOGRAM COMPLETE Result Date: 01/03/2025    ECHOCARDIOGRAM REPORT   Patient Name:   Heidi [name] Date of Exam: 01/03/2025 Medical Rec #:  [id number]          Height:       67.0 in Accession #:    [id number]         Weight:       144.0 lb Date of Birth:  March 13, 1986         BSA:          1.759 m Patient Age:    38 years           BP:           121/82 mmHg Patient Gender: F                  HR:           93 bpm. Exam Location:  Inpatient Procedure: 2D Echo,  Cardiac Doppler and Color Doppler (Both Spectral and Color            Flow Doppler were utilized during procedure). Indications:    CHF I50.9  History:        Patient has prior history of Echocardiogram examinations, most                 recent 09/08/2023.  Sonographer:    Nathanel Devonshire Referring Phys: [id number] DAVID MANUEL ORTIZ IMPRESSIONS  1. Left ventricular ejection fraction, by estimation, is 65 to 70%. The left  ventricle has normal function. The left ventricle has no regional wall motion abnormalities. Left ventricular diastolic parameters are indeterminate. There is the interventricular septum is flattened in systole and diastole, consistent with right ventricular pressure and volume overload.  2. Right ventricular systolic function is moderately reduced. The right ventricular size is mildly enlarged. There is severely elevated pulmonary artery systolic pressure. The estimated right ventricular systolic pressure is 64.2 mmHg.  3. Left atrial size was mildly dilated.  4. Right atrial size was moderately dilated.  5. Moderate pericardial effusion without significant inflow velocity variation, no RA/RV collapse, IVC is normal size but does not collapse. Pericardium is best seen on subcostal images, measures 0.63 cm adjacent to RA during diastole, consistent with significant thickening. Moderate pericardial effusion. The pericardial effusion is circumferential.  6. The mitral valve is grossly normal. Trivial mitral valve regurgitation. No evidence of mitral stenosis.  7. The aortic valve is grossly normal. Aortic valve regurgitation is not visualized. No aortic stenosis is present.  8. The inferior vena cava is normal in size with <50% respiratory variability, suggesting right atrial pressure of 8 mmHg. Conclusion(s)/Recommendation(s): With pericardial thickening, severely elevated PA pressures, and history of lymphoma treatment (no mantle radiation that I see, but focal palliative radiation to lung mass in 2017,  plus multiple rounds of chemotherapy), concern would be for constriction causing pulmonary hypertension and right sided heart failure. Tissue doppler signals on current study not well aligned, but on prior studies appear normal, arguing against restriction. Cannot exclude other possible etiologies of pulmonary hypertension as well. Would recommend consulting advanced heart failure to discuss right heart cath with constriction study versus alternative workup. FINDINGS  Left Ventricle: Left ventricular ejection fraction, by estimation, is 65 to 70%. The left ventricle has normal function. The left ventricle has no regional wall motion abnormalities. The left ventricular internal cavity size was normal in size. There is  no left ventricular hypertrophy. The interventricular septum is flattened in systole and diastole, consistent with right ventricular pressure and volume overload. Left ventricular diastolic parameters are indeterminate. Right Ventricle: The right ventricular size is mildly enlarged. Right vetricular wall thickness was not well visualized. Right ventricular systolic function is moderately reduced. There is severely elevated pulmonary artery systolic pressure. The tricuspid regurgitant velocity is 3.75 m/s, and with an assumed right atrial pressure of 8 mmHg, the estimated right ventricular systolic pressure is 64.2 mmHg. Left Atrium: Left atrial size was mildly dilated. Right Atrium: Right atrial size was moderately dilated. Pericardium: Moderate pericardial effusion without significant inflow velocity variation, no RA/RV collapse, IVC is normal size but does not collapse. Pericardium is best seen on subcostal images, measures 0.63 cm adjacent to RA during diastole, consistent with significant thickening. A moderately sized pericardial effusion is present. The pericardial effusion is circumferential. Mitral Valve: The mitral valve is grossly normal. Trivial mitral valve regurgitation. No evidence of  mitral valve stenosis. Tricuspid Valve: The tricuspid valve is grossly normal. Tricuspid valve regurgitation is mild . No evidence of tricuspid stenosis. Aortic Valve: The aortic valve is grossly normal. Aortic valve regurgitation is not visualized. No aortic stenosis is present. Aortic valve mean gradient measures 2.0 mmHg. Aortic valve peak gradient measures 3.2 mmHg. Aortic valve area, by VTI measures 2.45 cm. Pulmonic Valve: The pulmonic valve was not well visualized. Pulmonic valve regurgitation is trivial. No evidence of pulmonic stenosis. Aorta: The aortic root is normal in size and structure. Venous: The inferior vena cava is normal in size with less than 50% respiratory  variability, suggesting right atrial pressure of 8 mmHg. IAS/Shunts: The atrial septum is grossly normal. Additional Comments: There is a small pleural effusion in both left and right lateral regions.  LEFT VENTRICLE PLAX 2D LVIDd:         3.80 cm     Diastology LVIDs:         2.50 cm     LV e' medial:   4.79 cm/s LV PW:         0.90 cm     LV E/e' medial: 19.9 LV IVS:        0.90 cm LVOT diam:     1.90 cm LV SV:         37 LV SV Index:   21 LVOT Area:     2.84 cm  LV Volumes (MOD) LV vol d, MOD A2C: 41.2 ml LV vol d, MOD A4C: 37.2 ml LV vol s, MOD A2C: 15.3 ml LV vol s, MOD A4C: 10.4 ml LV SV MOD A2C:     25.9 ml LV SV MOD A4C:     37.2 ml LV SV MOD BP:      29.6 ml RIGHT VENTRICLE          IVC RV Basal diam:  4.00 cm  IVC diam: 1.90 cm TAPSE (M-mode): 1.1 cm LEFT ATRIUM             Index        RIGHT ATRIUM           Index LA diam:        2.70 cm 1.54 cm/m   RA Area:     18.20 cm LA Vol (A2C):   48.2 ml 27.41 ml/m  RA Volume:   53.50 ml  30.42 ml/m LA Vol (A4C):   52.4 ml 29.79 ml/m LA Biplane Vol: 53.1 ml 30.19 ml/m  AORTIC VALVE AV Area (Vmax):    2.71 cm AV Area (Vmean):   2.72 cm AV Area (VTI):     2.45 cm AV Vmax:           89.70 cm/s AV Vmean:          56.900 cm/s AV VTI:            0.149 m AV Peak Grad:      3.2 mmHg AV  Mean Grad:      2.0 mmHg LVOT Vmax:         85.80 cm/s LVOT Vmean:        54.500 cm/s LVOT VTI:          0.129 m LVOT/AV VTI ratio: 0.87  AORTA Ao Root diam: 2.80 cm MITRAL VALVE                TRICUSPID VALVE MV Area (PHT): 6.67 cm     TR Peak grad:   56.2 mmHg MV E velocity: 95.20 cm/s   TR Vmax:        375.00 cm/s MV A velocity: 110.00 cm/s MV E/A ratio:  0.87         SHUNTS                             Systemic VTI:  0.13 m                             Systemic Diam: 1.90 cm Shelda Bruckner MD Electronically signed by  Shelda Bruckner MD Signature Date/Time: 01/03/2025/9:02:04 PM    Final    MR LIVER W WO CONTRAST Result Date: 01/03/2025 CLINICAL DATA:  Ascites Malignant ascites. Hx of Hodgkin's lymphoma. * Tracking Code: BO * EXAM: MRI ABDOMEN WITHOUT AND WITH CONTRAST TECHNIQUE: Multiplanar multisequence MR imaging of the abdomen was performed both before and after the administration of intravenous contrast. CONTRAST:  6mL GADAVIST  GADOBUTROL  1 MMOL/ML IV SOLN COMPARISON:  CT scan abdomen and pelvis from 01/02/2025. FINDINGS: Technologist noted limited exam. Patient premedicated due to claustrophobia. Patient falling asleep. Lower chest: Markedly enlarged heart. There is small right pleural effusion. No significant left pleural effusion. There are atelectatic changes at the bilateral lung bases, right more than left, better evaluated on the CT angiography chest from yesterday. Hepatobiliary: The liver is mildly enlarged measuring up to 16.3 cm in length. Noncirrhotic configuration. No focal lesion. The heterogeneous appearance seen on the CT scan abdomen and pelvis from yesterday was likely due to phase of contrast. No intrahepatic or extrahepatic bile duct dilatation. No choledocholithiasis. Gallbladder is physiologically distended. There is mild diffuse gallbladder wall edema, likely secondary to systemic causes. No pericholecystic fat stranding. No bladder calculi. Pancreas: No mass, inflammatory  changes or other parenchymal abnormality identified. No main pancreatic duct dilation. Spleen:  Within normal limits in size and appearance. No focal mass. Adrenals/Urinary Tract: Unremarkable adrenal glands. No hydroureteronephrosis. No suspicious renal mass. Stomach/Bowel: Visualized portions within the abdomen are unremarkable. No disproportionate dilation of bowel loops. Vascular/Lymphatic: No pathologically enlarged lymph nodes identified. No abdominal aortic aneurysm demonstrated. There is small amount of ascites. Other:  There is mild-to-moderate anasarca. Musculoskeletal: Redemonstration of altered signal intensity lesions in the T12, L1, L3 and L5 vertebrae, which corresponds to mixed sclerotic/lytic lesions on the recent CT scan. These were previously characterized as osseous metastases. No discrete pathological fracture seen. IMPRESSION: 1. No focal liver lesion seen. Mild hepatomegaly. The heterogeneous appearance seen on the CT scan abdomen and pelvis from yesterday was likely due to phase of contrast. 2. No biliary ductal dilation. No choledocholithiasis. 3. Small ascites. Mild-to-moderate anasarca. 4. Redemonstration of multiple osseous metastases. No discrete pathological fracture. Electronically Signed   By: Ree Molt M.D.   On: 01/03/2025 13:07    Cardiac Studies:  ECG: ST rate 101 low voltage    Telemetry:  SR/ST  Echo: IMPRESSIONS     1. Left ventricular ejection fraction, by estimation, is 65 to 70%. The  left ventricle has normal function. The left ventricle has no regional  wall motion abnormalities. Left ventricular diastolic parameters are  indeterminate. There is the  interventricular septum is flattened in systole and diastole, consistent  with right ventricular pressure and volume overload.   2. Right ventricular systolic function is moderately reduced. The right  ventricular size is mildly enlarged. There is severely elevated pulmonary  artery systolic pressure. The  estimated right ventricular systolic  pressure is 64.2 mmHg.   3. Left atrial size was mildly dilated.   4. Right atrial size was moderately dilated.   5. Moderate pericardial effusion without significant inflow velocity  variation, no RA/RV collapse, IVC is normal size but does not collapse.  Pericardium is best seen on subcostal images, measures 0.63 cm adjacent to  RA during diastole, consistent with  significant thickening. Moderate pericardial effusion. The pericardial  effusion is circumferential.   6. The mitral valve is grossly normal. Trivial mitral valve  regurgitation. No evidence of mitral stenosis.   7. The aortic valve is grossly  normal. Aortic valve regurgitation is not  visualized. No aortic stenosis is present.   8. The inferior vena cava is normal in size with <50% respiratory  variability, suggesting right atrial pressure of 8 mmHg.   Conclusion(s)/Recommendation(s): With pericardial thickening, severely  elevated PA pressures, and history of lymphoma treatment (no mantle  radiation that I see, but focal palliative radiation to lung mass in 2017,  plus multiple rounds of chemotherapy),  concern would be for constriction causing pulmonary hypertension and right  sided heart failure. Tissue doppler signals on current study not well  aligned, but on prior studies appear normal, arguing against restriction.  Cannot exclude other possible  etiologies of pulmonary hypertension as well. Would recommend consulting  advanced heart failure to discuss right heart cath with constriction study  versus alternative workup.   Medications:    budesonide  (PULMICORT ) nebulizer solution  0.25 mg Nebulization BID   Chlorhexidine  Gluconate Cloth  6 each Topical Daily   enoxaparin  (LOVENOX ) injection  40 mg Subcutaneous Q24H   furosemide   40 mg Oral Daily   ipratropium-albuterol   3 mL Nebulization QID   sodium chloride  flush  10-40 mL Intracatheter Q12H      Assessment/Plan:    Heidi [name] is a 39 y.o. 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 Caleen.   Pericardial effusion:  ? Effusive constrictive dx with pulmonary HTN.  Discussed right heart cath with patient including simultaneous R/L pressure tracings to tease out PHT, constriction and elevated EDP. She is willing to be transferred to [facility] for procedure Monday Can have CHF consult at that time continue lasix  and add low dose coreg    [name] 01/05/2025, 9:59 AM    "

## 2025-01-05 NOTE — Progress Notes (Signed)
 " PROGRESS NOTE    Heidi Heidi [name]  FMW:[id number] DOB: 1986/09/21 DOA: 01/02/2025 PCP: Pcp, No   Brief Narrative:  39 year old female with PMH of asthma, Hodgkin's lymphoma, noncompliance, pericardial effusion presented with worsening abdominal distention, shortness of breath. CT abdomen/pelvis with contrast showed diffusely heterogeneous appearance of the hepatic parenchyma, concerning for metastatic disease and MRI was recommended.  MRI showed no focal liver lesion.  She was started on IV Lasix .  TTE showed EF of 65 to 70% with moderately reduced RV function with severe pulmonary hypertension and moderate pericardial effusion without tamponade.  Cardiology was consulted.  Assessment & Plan:   Acute respiratory failure with hypoxia -Possibly from volume overload and severe pulmonary hypertension. - Still on 2 to 3 L oxygen via nasal cannula.  Wean off as able.  Patient might require supplemental oxygen on discharge  Pericardial effusion Severe pulmonary hypertension -Moderate pericardial effusion on TTE without tamponade. - Cardiology following: IV Lasix  has been changed to Lasix  40 mg daily from today.  Patient initially wanting to do right heart cath as an outpatient but now is agreeable for inpatient right heart cath.  She will be transferred to [facility] for the same as per cardiology recommendations.  Hodgkin's lymphoma -CT abd showed diffusely heterogeneous appearance of the hepatic parenchyma, which could be due to recent chemotherapy or hepatic congestion, but there is also concern for metastatic disease. MRI completed and no focal liver lesion seen (the appearance on the CT was likely due to phase of contrast) - f/u with Dr. Kale/oncology as an outpatient  Hyponatremia - Resolved  DVT prophylaxis: Lovenox  Code Status: Full Family Communication: None at bedside Disposition Plan: Status is: Inpatient Remains inpatient appropriate because: Of severity of illness.   Need for transfer to [facility].    Consultants: Cardiology  Procedures: 2D echo  Antimicrobials: None   Subjective: Patient seen and examined at bedside.  Still short of breath with minimal exertion.  Does not feel well.  No fever, chest pain, vomiting reported.  Objective: Vitals:   01/05/25 0114 01/05/25 0441 01/05/25 0750 01/05/25 0900  BP: 106/83 104/71    Pulse: (!) 110 (!) 113    Resp: (!) 22 20  18   Temp: 97.7 F (36.5 C) 98 F (36.7 C)    TempSrc: Oral Oral    SpO2: 96% 95% 98%   Weight:  67.9 kg    Height:        Intake/Output Summary (Last 24 hours) at 01/05/2025 1113 Last data filed at 01/05/2025 0900 Gross per 24 hour  Intake 1430 ml  Output 1800 ml  Net -370 ml   Filed Weights   01/03/25 1330 01/04/25 0500 01/05/25 0441  Weight: 66.8 kg 69.3 kg 67.9 kg    Examination:  General exam: Appears calm and comfortable.  On 2 L oxygen by nasal cannula. Respiratory system: Bilateral decreased breath sounds at bases with scattered crackles Cardiovascular system: S1 & S2 heard, tachycardic Gastrointestinal system: Abdomen is distended, soft and nontender. Normal bowel sounds heard. Extremities: No cyanosis, clubbing; trace lower extremity edema Central nervous system: Alert and oriented. No focal neurological deficits. Moving extremities Skin: No rashes, lesions or ulcers Psychiatry: Flat affect.  Not agitated    Data Reviewed: I have personally reviewed following labs and imaging studies  CBC: Recent Labs  Lab 01/02/25 1256 01/02/25 1306 01/03/25 0527 01/05/25 0344  WBC 3.3*  --  4.1 5.2  NEUTROABS 2.6  --   --   --  HGB 12.7 13.6 13.1 12.7  HCT 37.6 40.0 40.3 39.9  MCV 97.4  --  100.0 101.3*  PLT 199  --  214 216   Basic Metabolic Panel: Recent Labs  Lab 01/02/25 1256 01/02/25 1306 01/03/25 0527 01/05/25 0344  NA 134* 134* 134* 137  K 3.9 3.7 4.6 3.7  CL 96* 94* 95* 96*  CO2 26  --  29 34*  GLUCOSE 89 86 143* 99  BUN 12 12  13 12   CREATININE 0.90 1.00 1.01* 0.95  CALCIUM  8.5*  --  8.2* 8.5*  MG  --   --   --  1.9   GFR: Estimated Creatinine Clearance: 77.3 mL/min (by C-G formula based on SCr of 0.95 mg/dL). Liver Function Tests: Recent Labs  Lab 01/02/25 1256 01/03/25 0527  AST 42* 38  ALT 31 34  ALKPHOS 68 70  BILITOT 1.3* 0.8  PROT 5.6* 6.1*  ALBUMIN  3.7 3.7   Recent Labs  Lab 01/02/25 1256  LIPASE 18   No results for input(s): AMMONIA in the last 168 hours. Coagulation Profile: No results for input(s): INR, PROTIME in the last 168 hours. Cardiac Enzymes: No results for input(s): CKTOTAL, CKMB, CKMBINDEX, TROPONINI in the last 168 hours. BNP (last 3 results) Recent Labs    01/02/25 1256  PROBNP 9,034.0*   HbA1C: No results for input(s): HGBA1C in the last 72 hours. CBG: No results for input(s): GLUCAP in the last 168 hours. Lipid Profile: No results for input(s): CHOL, HDL, LDLCALC, TRIG, CHOLHDL, LDLDIRECT in the last 72 hours. Thyroid  Function Tests: No results for input(s): TSH, T4TOTAL, FREET4, T3FREE, THYROIDAB in the last 72 hours. Anemia Panel: No results for input(s): VITAMINB12, FOLATE, FERRITIN, TIBC, IRON, RETICCTPCT in the last 72 hours. Sepsis Labs: No results for input(s): PROCALCITON, LATICACIDVEN in the last 168 hours.  No results found for this or any previous visit (from the past 240 hours).       Radiology Studies: ECHOCARDIOGRAM COMPLETE Result Date: 01/03/2025    ECHOCARDIOGRAM REPORT   Patient Heidi [name]:   Heidi Heidi [name] Date of Exam: 01/03/2025 Medical Rec #:  [id number]          Height:       67.0 in Accession #:    [id number]         Weight:       144.0 lb Date of Birth:  July 15, 1986         BSA:          1.759 m Patient Age:    38 years           BP:           121/82 mmHg Patient Gender: F                  HR:           93 bpm. Exam Location:  Inpatient Procedure: 2D Echo, Cardiac Doppler and Color  Doppler (Both Spectral and Color            Flow Doppler were utilized during procedure). Indications:    CHF I50.9  History:        Patient has prior history of Echocardiogram examinations, most                 recent 09/08/2023.  Sonographer:    Nathanel Devonshire Referring Phys: [id number] DAVID MANUEL ORTIZ IMPRESSIONS  1. Left ventricular ejection fraction, by estimation, is 65 to 70%. The left ventricle has normal  function. The left ventricle has no regional wall motion abnormalities. Left ventricular diastolic parameters are indeterminate. There is the interventricular septum is flattened in systole and diastole, consistent with right ventricular pressure and volume overload.  2. Right ventricular systolic function is moderately reduced. The right ventricular size is mildly enlarged. There is severely elevated pulmonary artery systolic pressure. The estimated right ventricular systolic pressure is 64.2 mmHg.  3. Left atrial size was mildly dilated.  4. Right atrial size was moderately dilated.  5. Moderate pericardial effusion without significant inflow velocity variation, no RA/RV collapse, IVC is normal size but does not collapse. Pericardium is best seen on subcostal images, measures 0.63 cm adjacent to RA during diastole, consistent with significant thickening. Moderate pericardial effusion. The pericardial effusion is circumferential.  6. The mitral valve is grossly normal. Trivial mitral valve regurgitation. No evidence of mitral stenosis.  7. The aortic valve is grossly normal. Aortic valve regurgitation is not visualized. No aortic stenosis is present.  8. The inferior vena cava is normal in size with <50% respiratory variability, suggesting right atrial pressure of 8 mmHg. Conclusion(s)/Recommendation(s): With pericardial thickening, severely elevated PA pressures, and history of lymphoma treatment (no mantle radiation that I see, but focal palliative radiation to lung mass in 2017, plus multiple rounds of  chemotherapy), concern would be for constriction causing pulmonary hypertension and right sided heart failure. Tissue doppler signals on current study not well aligned, but on prior studies appear normal, arguing against restriction. Cannot exclude other possible etiologies of pulmonary hypertension as well. Would recommend consulting advanced heart failure to discuss right heart cath with constriction study versus alternative workup. FINDINGS  Left Ventricle: Left ventricular ejection fraction, by estimation, is 65 to 70%. The left ventricle has normal function. The left ventricle has no regional wall motion abnormalities. The left ventricular internal cavity size was normal in size. There is  no left ventricular hypertrophy. The interventricular septum is flattened in systole and diastole, consistent with right ventricular pressure and volume overload. Left ventricular diastolic parameters are indeterminate. Right Ventricle: The right ventricular size is mildly enlarged. Right vetricular wall thickness was not well visualized. Right ventricular systolic function is moderately reduced. There is severely elevated pulmonary artery systolic pressure. The tricuspid regurgitant velocity is 3.75 m/s, and with an assumed right atrial pressure of 8 mmHg, the estimated right ventricular systolic pressure is 64.2 mmHg. Left Atrium: Left atrial size was mildly dilated. Right Atrium: Right atrial size was moderately dilated. Pericardium: Moderate pericardial effusion without significant inflow velocity variation, no RA/RV collapse, IVC is normal size but does not collapse. Pericardium is best seen on subcostal images, measures 0.63 cm adjacent to RA during diastole, consistent with significant thickening. A moderately sized pericardial effusion is present. The pericardial effusion is circumferential. Mitral Valve: The mitral valve is grossly normal. Trivial mitral valve regurgitation. No evidence of mitral valve stenosis.  Tricuspid Valve: The tricuspid valve is grossly normal. Tricuspid valve regurgitation is mild . No evidence of tricuspid stenosis. Aortic Valve: The aortic valve is grossly normal. Aortic valve regurgitation is not visualized. No aortic stenosis is present. Aortic valve mean gradient measures 2.0 mmHg. Aortic valve peak gradient measures 3.2 mmHg. Aortic valve area, by VTI measures 2.45 cm. Pulmonic Valve: The pulmonic valve was not well visualized. Pulmonic valve regurgitation is trivial. No evidence of pulmonic stenosis. Aorta: The aortic root is normal in size and structure. Venous: The inferior vena cava is normal in size with less than 50% respiratory variability, suggesting right  atrial pressure of 8 mmHg. IAS/Shunts: The atrial septum is grossly normal. Additional Comments: There is a small pleural effusion in both left and right lateral regions.  LEFT VENTRICLE PLAX 2D LVIDd:         3.80 cm     Diastology LVIDs:         2.50 cm     LV e' medial:   4.79 cm/s LV PW:         0.90 cm     LV E/e' medial: 19.9 LV IVS:        0.90 cm LVOT diam:     1.90 cm LV SV:         37 LV SV Index:   21 LVOT Area:     2.84 cm  LV Volumes (MOD) LV vol d, MOD A2C: 41.2 ml LV vol d, MOD A4C: 37.2 ml LV vol s, MOD A2C: 15.3 ml LV vol s, MOD A4C: 10.4 ml LV SV MOD A2C:     25.9 ml LV SV MOD A4C:     37.2 ml LV SV MOD BP:      29.6 ml RIGHT VENTRICLE          IVC RV Basal diam:  4.00 cm  IVC diam: 1.90 cm TAPSE (M-mode): 1.1 cm LEFT ATRIUM             Index        RIGHT ATRIUM           Index LA diam:        2.70 cm 1.54 cm/m   RA Area:     18.20 cm LA Vol (A2C):   48.2 ml 27.41 ml/m  RA Volume:   53.50 ml  30.42 ml/m LA Vol (A4C):   52.4 ml 29.79 ml/m LA Biplane Vol: 53.1 ml 30.19 ml/m  AORTIC VALVE AV Area (Vmax):    2.71 cm AV Area (Vmean):   2.72 cm AV Area (VTI):     2.45 cm AV Vmax:           89.70 cm/s AV Vmean:          56.900 cm/s AV VTI:            0.149 m AV Peak Grad:      3.2 mmHg AV Mean Grad:      2.0 mmHg  LVOT Vmax:         85.80 cm/s LVOT Vmean:        54.500 cm/s LVOT VTI:          0.129 m LVOT/AV VTI ratio: 0.87  AORTA Ao Root diam: 2.80 cm MITRAL VALVE                TRICUSPID VALVE MV Area (PHT): 6.67 cm     TR Peak grad:   56.2 mmHg MV E velocity: 95.20 cm/s   TR Vmax:        375.00 cm/s MV A velocity: 110.00 cm/s MV E/A ratio:  0.87         SHUNTS                             Systemic VTI:  0.13 m                             Systemic Diam: 1.90 cm Shelda Bruckner MD Electronically signed by Shelda Bruckner MD  Signature Date/Time: 01/03/2025/9:02:04 PM    Final    MR LIVER W WO CONTRAST Result Date: 01/03/2025 CLINICAL DATA:  Ascites Malignant ascites. Hx of Hodgkin's lymphoma. * Tracking Code: BO * EXAM: MRI ABDOMEN WITHOUT AND WITH CONTRAST TECHNIQUE: Multiplanar multisequence MR imaging of the abdomen was performed both before and after the administration of intravenous contrast. CONTRAST:  6mL GADAVIST  GADOBUTROL  1 MMOL/ML IV SOLN COMPARISON:  CT scan abdomen and pelvis from 01/02/2025. FINDINGS: Technologist noted limited exam. Patient premedicated due to claustrophobia. Patient falling asleep. Lower chest: Markedly enlarged heart. There is small right pleural effusion. No significant left pleural effusion. There are atelectatic changes at the bilateral lung bases, right more than left, better evaluated on the CT angiography chest from yesterday. Hepatobiliary: The liver is mildly enlarged measuring up to 16.3 cm in length. Noncirrhotic configuration. No focal lesion. The heterogeneous appearance seen on the CT scan abdomen and pelvis from yesterday was likely due to phase of contrast. No intrahepatic or extrahepatic bile duct dilatation. No choledocholithiasis. Gallbladder is physiologically distended. There is mild diffuse gallbladder wall edema, likely secondary to systemic causes. No pericholecystic fat stranding. No bladder calculi. Pancreas: No mass, inflammatory changes or other  parenchymal abnormality identified. No main pancreatic duct dilation. Spleen:  Within normal limits in size and appearance. No focal mass. Adrenals/Urinary Tract: Unremarkable adrenal glands. No hydroureteronephrosis. No suspicious renal mass. Stomach/Bowel: Visualized portions within the abdomen are unremarkable. No disproportionate dilation of bowel loops. Vascular/Lymphatic: No pathologically enlarged lymph nodes identified. No abdominal aortic aneurysm demonstrated. There is small amount of ascites. Other:  There is mild-to-moderate anasarca. Musculoskeletal: Redemonstration of altered signal intensity lesions in the T12, L1, L3 and L5 vertebrae, which corresponds to mixed sclerotic/lytic lesions on the recent CT scan. These were previously characterized as osseous metastases. No discrete pathological fracture seen. IMPRESSION: 1. No focal liver lesion seen. Mild hepatomegaly. The heterogeneous appearance seen on the CT scan abdomen and pelvis from yesterday was likely due to phase of contrast. 2. No biliary ductal dilation. No choledocholithiasis. 3. Small ascites. Mild-to-moderate anasarca. 4. Redemonstration of multiple osseous metastases. No discrete pathological fracture. Electronically Signed   By: Ree Molt M.D.   On: 01/03/2025 13:07        Scheduled Meds:  budesonide  (PULMICORT ) nebulizer solution  0.25 mg Nebulization BID   Chlorhexidine  Gluconate Cloth  6 each Topical Daily   enoxaparin  (LOVENOX ) injection  40 mg Subcutaneous Q24H   furosemide   40 mg Oral Daily   ipratropium-albuterol   3 mL Nebulization QID   sodium chloride  flush  10-40 mL Intracatheter Q12H   Continuous Infusions:        Sophie Mao, MD [facility] 01/05/2025, 11:13 AM   "

## 2025-01-05 NOTE — Progress Notes (Signed)
 "  Cardiology:  [facility]  Subjective:  Still with dyspnea does not feel well   Objective:  Vitals:   01/04/25 2006 01/05/25 0114 01/05/25 0441 01/05/25 0750  BP: 122/85 106/83 104/71   Pulse: (!) 110 (!) 110 (!) 113   Resp: 16 (!) 22 20   Temp: 97.9 F (36.6 C) 97.7 F (36.5 C) 98 F (36.7 C)   TempSrc: Oral Oral Oral   SpO2: 97% 96% 95% 98%  Weight:   67.9 kg   Height:        Intake/Output from previous day:  Intake/Output Summary (Last 24 hours) at 01/05/2025 0959 Last data filed at 01/05/2025 0300 Gross per 24 hour  Intake 1310 ml  Output 1800 ml  Net -490 ml    Physical Exam:  Chronically ill black female JVP elevated no Kusmal's sign Lungs clear Abdomen benign Plus one edema  Lab Results: Basic Metabolic Panel: Recent Labs    01/03/25 0527 01/05/25 0344  NA 134* 137  K 4.6 3.7  CL 95* 96*  CO2 29 34*  GLUCOSE 143* 99  BUN 13 12  CREATININE 1.01* 0.95  CALCIUM  8.2* 8.5*  MG  --  1.9   Liver Function Tests: Recent Labs    01/02/25 1256 01/03/25 0527  AST 42* 38  ALT 31 34  ALKPHOS 68 70  BILITOT 1.3* 0.8  PROT 5.6* 6.1*  ALBUMIN  3.7 3.7   Recent Labs    01/02/25 1256  LIPASE 18   CBC: Recent Labs    01/02/25 1256 01/02/25 1306 01/03/25 0527 01/05/25 0344  WBC 3.3*  --  4.1 5.2  NEUTROABS 2.6  --   --   --   HGB 12.7   < > 13.1 12.7  HCT 37.6   < > 40.3 39.9  MCV 97.4  --  100.0 101.3*  PLT 199  --  214 216   < > = values in this interval not displayed.    Imaging: ECHOCARDIOGRAM COMPLETE Result Date: 01/03/2025    ECHOCARDIOGRAM REPORT   Patient Name:   Heidi [name] Date of Exam: 01/03/2025 Medical Rec #:  [id number]          Height:       67.0 in Accession #:    [id number]         Weight:       144.0 lb Date of Birth:  [birth year]         BSA:          1.759 m Patient Age:    38 years           BP:           121/82 mmHg Patient Gender: F                  HR:           93 bpm. Exam Location:  Inpatient Procedure: 2D Echo,  Cardiac Doppler and Color Doppler (Both Spectral and Color            Flow Doppler were utilized during procedure). Indications:    CHF I50.9  History:        Patient has prior history of Echocardiogram examinations, most                 recent 09/08/2023.  Sonographer:    Nathanel Devonshire Referring Phys: [id number] DAVID MANUEL ORTIZ IMPRESSIONS  1. Left ventricular ejection fraction, by estimation, is 65 to 70%. The left  ventricle has normal function. The left ventricle has no regional wall motion abnormalities. Left ventricular diastolic parameters are indeterminate. There is the interventricular septum is flattened in systole and diastole, consistent with right ventricular pressure and volume overload.  2. Right ventricular systolic function is moderately reduced. The right ventricular size is mildly enlarged. There is severely elevated pulmonary artery systolic pressure. The estimated right ventricular systolic pressure is 64.2 mmHg.  3. Left atrial size was mildly dilated.  4. Right atrial size was moderately dilated.  5. Moderate pericardial effusion without significant inflow velocity variation, no RA/RV collapse, IVC is normal size but does not collapse. Pericardium is best seen on subcostal images, measures 0.63 cm adjacent to RA during diastole, consistent with significant thickening. Moderate pericardial effusion. The pericardial effusion is circumferential.  6. The mitral valve is grossly normal. Trivial mitral valve regurgitation. No evidence of mitral stenosis.  7. The aortic valve is grossly normal. Aortic valve regurgitation is not visualized. No aortic stenosis is present.  8. The inferior vena cava is normal in size with <50% respiratory variability, suggesting right atrial pressure of 8 mmHg. Conclusion(s)/Recommendation(s): With pericardial thickening, severely elevated PA pressures, and history of lymphoma treatment (no mantle radiation that I see, but focal palliative radiation to lung mass in 2017,  plus multiple rounds of chemotherapy), concern would be for constriction causing pulmonary hypertension and right sided heart failure. Tissue doppler signals on current study not well aligned, but on prior studies appear normal, arguing against restriction. Cannot exclude other possible etiologies of pulmonary hypertension as well. Would recommend consulting advanced heart failure to discuss right heart cath with constriction study versus alternative workup. FINDINGS  Left Ventricle: Left ventricular ejection fraction, by estimation, is 65 to 70%. The left ventricle has normal function. The left ventricle has no regional wall motion abnormalities. The left ventricular internal cavity size was normal in size. There is  no left ventricular hypertrophy. The interventricular septum is flattened in systole and diastole, consistent with right ventricular pressure and volume overload. Left ventricular diastolic parameters are indeterminate. Right Ventricle: The right ventricular size is mildly enlarged. Right vetricular wall thickness was not well visualized. Right ventricular systolic function is moderately reduced. There is severely elevated pulmonary artery systolic pressure. The tricuspid regurgitant velocity is 3.75 m/s, and with an assumed right atrial pressure of 8 mmHg, the estimated right ventricular systolic pressure is 64.2 mmHg. Left Atrium: Left atrial size was mildly dilated. Right Atrium: Right atrial size was moderately dilated. Pericardium: Moderate pericardial effusion without significant inflow velocity variation, no RA/RV collapse, IVC is normal size but does not collapse. Pericardium is best seen on subcostal images, measures 0.63 cm adjacent to RA during diastole, consistent with significant thickening. A moderately sized pericardial effusion is present. The pericardial effusion is circumferential. Mitral Valve: The mitral valve is grossly normal. Trivial mitral valve regurgitation. No evidence of  mitral valve stenosis. Tricuspid Valve: The tricuspid valve is grossly normal. Tricuspid valve regurgitation is mild . No evidence of tricuspid stenosis. Aortic Valve: The aortic valve is grossly normal. Aortic valve regurgitation is not visualized. No aortic stenosis is present. Aortic valve mean gradient measures 2.0 mmHg. Aortic valve peak gradient measures 3.2 mmHg. Aortic valve area, by VTI measures 2.45 cm. Pulmonic Valve: The pulmonic valve was not well visualized. Pulmonic valve regurgitation is trivial. No evidence of pulmonic stenosis. Aorta: The aortic root is normal in size and structure. Venous: The inferior vena cava is normal in size with less than 50% respiratory  variability, suggesting right atrial pressure of 8 mmHg. IAS/Shunts: The atrial septum is grossly normal. Additional Comments: There is a small pleural effusion in both left and right lateral regions.  LEFT VENTRICLE PLAX 2D LVIDd:         3.80 cm     Diastology LVIDs:         2.50 cm     LV e' medial:   4.79 cm/s LV PW:         0.90 cm     LV E/e' medial: 19.9 LV IVS:        0.90 cm LVOT diam:     1.90 cm LV SV:         37 LV SV Index:   21 LVOT Area:     2.84 cm  LV Volumes (MOD) LV vol d, MOD A2C: 41.2 ml LV vol d, MOD A4C: 37.2 ml LV vol s, MOD A2C: 15.3 ml LV vol s, MOD A4C: 10.4 ml LV SV MOD A2C:     25.9 ml LV SV MOD A4C:     37.2 ml LV SV MOD BP:      29.6 ml RIGHT VENTRICLE          IVC RV Basal diam:  4.00 cm  IVC diam: 1.90 cm TAPSE (M-mode): 1.1 cm LEFT ATRIUM             Index        RIGHT ATRIUM           Index LA diam:        2.70 cm 1.54 cm/m   RA Area:     18.20 cm LA Vol (A2C):   48.2 ml 27.41 ml/m  RA Volume:   53.50 ml  30.42 ml/m LA Vol (A4C):   52.4 ml 29.79 ml/m LA Biplane Vol: 53.1 ml 30.19 ml/m  AORTIC VALVE AV Area (Vmax):    2.71 cm AV Area (Vmean):   2.72 cm AV Area (VTI):     2.45 cm AV Vmax:           89.70 cm/s AV Vmean:          56.900 cm/s AV VTI:            0.149 m AV Peak Grad:      3.2 mmHg AV  Mean Grad:      2.0 mmHg LVOT Vmax:         85.80 cm/s LVOT Vmean:        54.500 cm/s LVOT VTI:          0.129 m LVOT/AV VTI ratio: 0.87  AORTA Ao Root diam: 2.80 cm MITRAL VALVE                TRICUSPID VALVE MV Area (PHT): 6.67 cm     TR Peak grad:   56.2 mmHg MV E velocity: 95.20 cm/s   TR Vmax:        375.00 cm/s MV A velocity: 110.00 cm/s MV E/A ratio:  0.87         SHUNTS                             Systemic VTI:  0.13 m                             Systemic Diam: 1.90 cm Shelda Bruckner MD Electronically signed by  Shelda Bruckner MD Signature Date/Time: 01/03/2025/9:02:04 PM    Final    MR LIVER W WO CONTRAST Result Date: 01/03/2025 CLINICAL DATA:  Ascites Malignant ascites. Hx of Hodgkin's lymphoma. * Tracking Code: BO * EXAM: MRI ABDOMEN WITHOUT AND WITH CONTRAST TECHNIQUE: Multiplanar multisequence MR imaging of the abdomen was performed both before and after the administration of intravenous contrast. CONTRAST:  6mL GADAVIST  GADOBUTROL  1 MMOL/ML IV SOLN COMPARISON:  CT scan abdomen and pelvis from 01/02/2025. FINDINGS: Technologist noted limited exam. Patient premedicated due to claustrophobia. Patient falling asleep. Lower chest: Markedly enlarged heart. There is small right pleural effusion. No significant left pleural effusion. There are atelectatic changes at the bilateral lung bases, right more than left, better evaluated on the CT angiography chest from yesterday. Hepatobiliary: The liver is mildly enlarged measuring up to 16.3 cm in length. Noncirrhotic configuration. No focal lesion. The heterogeneous appearance seen on the CT scan abdomen and pelvis from yesterday was likely due to phase of contrast. No intrahepatic or extrahepatic bile duct dilatation. No choledocholithiasis. Gallbladder is physiologically distended. There is mild diffuse gallbladder wall edema, likely secondary to systemic causes. No pericholecystic fat stranding. No bladder calculi. Pancreas: No mass, inflammatory  changes or other parenchymal abnormality identified. No main pancreatic duct dilation. Spleen:  Within normal limits in size and appearance. No focal mass. Adrenals/Urinary Tract: Unremarkable adrenal glands. No hydroureteronephrosis. No suspicious renal mass. Stomach/Bowel: Visualized portions within the abdomen are unremarkable. No disproportionate dilation of bowel loops. Vascular/Lymphatic: No pathologically enlarged lymph nodes identified. No abdominal aortic aneurysm demonstrated. There is small amount of ascites. Other:  There is mild-to-moderate anasarca. Musculoskeletal: Redemonstration of altered signal intensity lesions in the T12, L1, L3 and L5 vertebrae, which corresponds to mixed sclerotic/lytic lesions on the recent CT scan. These were previously characterized as osseous metastases. No discrete pathological fracture seen. IMPRESSION: 1. No focal liver lesion seen. Mild hepatomegaly. The heterogeneous appearance seen on the CT scan abdomen and pelvis from yesterday was likely due to phase of contrast. 2. No biliary ductal dilation. No choledocholithiasis. 3. Small ascites. Mild-to-moderate anasarca. 4. Redemonstration of multiple osseous metastases. No discrete pathological fracture. Electronically Signed   By: Ree Molt M.D.   On: 01/03/2025 13:07    Cardiac Studies:  ECG: ST rate 101 low voltage    Telemetry:  SR/ST  Echo: IMPRESSIONS     1. Left ventricular ejection fraction, by estimation, is 65 to 70%. The  left ventricle has normal function. The left ventricle has no regional  wall motion abnormalities. Left ventricular diastolic parameters are  indeterminate. There is the  interventricular septum is flattened in systole and diastole, consistent  with right ventricular pressure and volume overload.   2. Right ventricular systolic function is moderately reduced. The right  ventricular size is mildly enlarged. There is severely elevated pulmonary  artery systolic pressure. The  estimated right ventricular systolic  pressure is 64.2 mmHg.   3. Left atrial size was mildly dilated.   4. Right atrial size was moderately dilated.   5. Moderate pericardial effusion without significant inflow velocity  variation, no RA/RV collapse, IVC is normal size but does not collapse.  Pericardium is best seen on subcostal images, measures 0.63 cm adjacent to  RA during diastole, consistent with  significant thickening. Moderate pericardial effusion. The pericardial  effusion is circumferential.   6. The mitral valve is grossly normal. Trivial mitral valve  regurgitation. No evidence of mitral stenosis.   7. The aortic valve is grossly  normal. Aortic valve regurgitation is not  visualized. No aortic stenosis is present.   8. The inferior vena cava is normal in size with <50% respiratory  variability, suggesting right atrial pressure of 8 mmHg.   Conclusion(s)/Recommendation(s): With pericardial thickening, severely  elevated PA pressures, and history of lymphoma treatment (no mantle  radiation that I see, but focal palliative radiation to lung mass in 2017,  plus multiple rounds of chemotherapy),  concern would be for constriction causing pulmonary hypertension and right  sided heart failure. Tissue doppler signals on current study not well  aligned, but on prior studies appear normal, arguing against restriction.  Cannot exclude other possible  etiologies of pulmonary hypertension as well. Would recommend consulting  advanced heart failure to discuss right heart cath with constriction study  versus alternative workup.   Medications:    budesonide  (PULMICORT ) nebulizer solution  0.25 mg Nebulization BID   Chlorhexidine  Gluconate Cloth  6 each Topical Daily   enoxaparin  (LOVENOX ) injection  40 mg Subcutaneous Q24H   furosemide   40 mg Oral Daily   ipratropium-albuterol   3 mL Nebulization QID   sodium chloride  flush  10-40 mL Intracatheter Q12H      Assessment/Plan:    Heidi [name] is a 39 y.o. 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 Caleen.   Pericardial effusion:  ? Effusive constrictive dx with pulmonary HTN.  Discussed right heart cath with patient including simultaneous R/L pressure tracings to tease out PHT, constriction and elevated EDP. She is willing to be transferred to [facility] for procedure Monday Can have CHF consult at that time continue lasix  and add low dose coreg    [name] 01/05/2025, 9:59 AM    "

## 2025-01-06 DIAGNOSIS — J90 Pleural effusion, not elsewhere classified: Secondary | ICD-10-CM

## 2025-01-06 DIAGNOSIS — D72819 Decreased white blood cell count, unspecified: Secondary | ICD-10-CM | POA: Diagnosis not present

## 2025-01-06 DIAGNOSIS — C7951 Secondary malignant neoplasm of bone: Secondary | ICD-10-CM

## 2025-01-06 DIAGNOSIS — R180 Malignant ascites: Secondary | ICD-10-CM | POA: Diagnosis not present

## 2025-01-06 DIAGNOSIS — C8190 Hodgkin lymphoma, unspecified, unspecified site: Secondary | ICD-10-CM | POA: Diagnosis not present

## 2025-01-06 DIAGNOSIS — E8770 Fluid overload, unspecified: Secondary | ICD-10-CM | POA: Diagnosis not present

## 2025-01-06 DIAGNOSIS — R7989 Other specified abnormal findings of blood chemistry: Secondary | ICD-10-CM | POA: Diagnosis not present

## 2025-01-06 DIAGNOSIS — R160 Hepatomegaly, not elsewhere classified: Secondary | ICD-10-CM | POA: Diagnosis not present

## 2025-01-06 DIAGNOSIS — I3139 Other pericardial effusion (noninflammatory): Secondary | ICD-10-CM

## 2025-01-06 DIAGNOSIS — J9601 Acute respiratory failure with hypoxia: Secondary | ICD-10-CM | POA: Diagnosis not present

## 2025-01-06 LAB — CBC WITH DIFFERENTIAL/PLATELET
Abs Immature Granulocytes: 0 K/uL (ref 0.00–0.07)
Basophils Absolute: 0 K/uL (ref 0.0–0.1)
Basophils Relative: 0 %
Eosinophils Absolute: 0 K/uL (ref 0.0–0.5)
Eosinophils Relative: 0 %
HCT: 37.7 % (ref 36.0–46.0)
Hemoglobin: 12.3 g/dL (ref 12.0–15.0)
Immature Granulocytes: 0 %
Lymphocytes Relative: 5 %
Lymphs Abs: 0.2 K/uL — ABNORMAL LOW (ref 0.7–4.0)
MCH: 32.1 pg (ref 26.0–34.0)
MCHC: 32.6 g/dL (ref 30.0–36.0)
MCV: 98.4 fL (ref 80.0–100.0)
Monocytes Absolute: 0.7 K/uL (ref 0.1–1.0)
Monocytes Relative: 15 %
Neutro Abs: 3.6 K/uL (ref 1.7–7.7)
Neutrophils Relative %: 80 %
Platelets: 209 K/uL (ref 150–400)
RBC: 3.83 MIL/uL — ABNORMAL LOW (ref 3.87–5.11)
RDW: 13.1 % (ref 11.5–15.5)
WBC: 4.5 K/uL (ref 4.0–10.5)
nRBC: 0 % (ref 0.0–0.2)

## 2025-01-06 LAB — COMPREHENSIVE METABOLIC PANEL WITH GFR
ALT: 23 U/L (ref 0–44)
AST: 23 U/L (ref 15–41)
Albumin: 3.6 g/dL (ref 3.5–5.0)
Alkaline Phosphatase: 75 U/L (ref 38–126)
Anion gap: 8 (ref 5–15)
BUN: 9 mg/dL (ref 6–20)
CO2: 36 mmol/L — ABNORMAL HIGH (ref 22–32)
Calcium: 8.6 mg/dL — ABNORMAL LOW (ref 8.9–10.3)
Chloride: 91 mmol/L — ABNORMAL LOW (ref 98–111)
Creatinine, Ser: 0.85 mg/dL (ref 0.44–1.00)
GFR, Estimated: >60 mL/min
Glucose, Bld: 99 mg/dL (ref 70–99)
Potassium: 3.7 mmol/L (ref 3.5–5.1)
Sodium: 135 mmol/L (ref 135–145)
Total Bilirubin: 1.3 mg/dL — ABNORMAL HIGH (ref 0.0–1.2)
Total Protein: 5.8 g/dL — ABNORMAL LOW (ref 6.5–8.1)

## 2025-01-06 LAB — BASIC METABOLIC PANEL WITH GFR
Anion gap: 6 (ref 5–15)
BUN: 10 mg/dL (ref 6–20)
CO2: 39 mmol/L — ABNORMAL HIGH (ref 22–32)
Calcium: 8.2 mg/dL — ABNORMAL LOW (ref 8.9–10.3)
Chloride: 92 mmol/L — ABNORMAL LOW (ref 98–111)
Creatinine, Ser: 0.82 mg/dL (ref 0.44–1.00)
GFR, Estimated: >60 mL/min
Glucose, Bld: 109 mg/dL — ABNORMAL HIGH (ref 70–99)
Potassium: 3.6 mmol/L (ref 3.5–5.1)
Sodium: 136 mmol/L (ref 135–145)

## 2025-01-06 LAB — MAGNESIUM
Magnesium: 1.4 mg/dL — ABNORMAL LOW (ref 1.7–2.4)
Magnesium: 1.3 mg/dL — ABNORMAL LOW (ref 1.7–2.4)

## 2025-01-06 LAB — PHOSPHORUS: Phosphorus: 2.9 mg/dL (ref 2.5–4.6)

## 2025-01-06 MED ORDER — SENNA 8.6 MG PO TABS
1.0000 | ORAL_TABLET | Freq: Every day | ORAL | Status: DC | PRN
  Administered 2025-01-06: 8.6 mg via ORAL
  Filled 2025-01-06: qty 1

## 2025-01-06 MED ORDER — CARVEDILOL 3.125 MG PO TABS
3.1250 mg | ORAL_TABLET | Freq: Two times a day (BID) | ORAL | Status: DC
  Administered 2025-01-06 – 2025-01-08 (×3): 3.125 mg via ORAL
  Filled 2025-01-06 (×4): qty 1

## 2025-01-06 MED ORDER — MAGNESIUM SULFATE 4 GM/100ML IV SOLN
4.0000 g | Freq: Once | INTRAVENOUS | Status: AC
  Administered 2025-01-06: 4 g via INTRAVENOUS
  Filled 2025-01-06: qty 100

## 2025-01-06 MED ORDER — ASPIRIN 81 MG PO CHEW
81.0000 mg | CHEWABLE_TABLET | ORAL | Status: AC
  Administered 2025-01-07: 81 mg via ORAL
  Filled 2025-01-06: qty 1

## 2025-01-06 MED ORDER — FREE WATER
250.0000 mL | Freq: Once | Status: AC
  Administered 2025-01-07: 250 mL via ORAL

## 2025-01-06 NOTE — Progress Notes (Signed)
 "  Cardiology:  [facility]  Subjective:  Bit less dyspnea discussed heart cath again this am   Objective:  Vitals:   01/06/25 0017 01/06/25 0121 01/06/25 0343 01/06/25 0729  BP: 122/87  (!) 121/92 (!) 128/91  Pulse: (!) 131  (!) 119 (!) 115  Resp: 20  20 20   Temp: 98.1 F (36.7 C)  98.9 F (37.2 C) 97.6 F (36.4 C)  TempSrc: Oral  Oral Oral  SpO2: 95%  94% 96%  Weight:  65.1 kg    Height:        Intake/Output from previous day:  Intake/Output Summary (Last 24 hours) at 01/06/2025 0834 Last data filed at 01/05/2025 2205 Gross per 24 hour  Intake 597 ml  Output --  Net 597 ml    Physical Exam:  Chronically ill black female JVP elevated no Kusmal's sign Lungs clear Abdomen benign Plus one edema  Lab Results: Basic Metabolic Panel: Recent Labs    01/05/25 0344  NA 137  K 3.7  CL 96*  CO2 34*  GLUCOSE 99  BUN 12  CREATININE 0.95  CALCIUM  8.5*  MG 1.9     CBC: Recent Labs    01/05/25 0344  WBC 5.2  HGB 12.7  HCT 39.9  MCV 101.3*  PLT 216    Imaging: No results found.  Cardiac Studies:  ECG: ST rate 101 low voltage    Telemetry:  SR/ST  Echo: IMPRESSIONS     1. Left ventricular ejection fraction, by estimation, is 65 to 70%. The  left ventricle has normal function. The left ventricle has no regional  wall motion abnormalities. Left ventricular diastolic parameters are  indeterminate. There is the  interventricular septum is flattened in systole and diastole, consistent  with right ventricular pressure and volume overload.   2. Right ventricular systolic function is moderately reduced. The right  ventricular size is mildly enlarged. There is severely elevated pulmonary  artery systolic pressure. The estimated right ventricular systolic  pressure is 64.2 mmHg.   3. Left atrial size was mildly dilated.   4. Right atrial size was moderately dilated.   5. Moderate pericardial effusion without significant inflow velocity  variation, no RA/RV  collapse, IVC is normal size but does not collapse.  Pericardium is best seen on subcostal images, measures 0.63 cm adjacent to  RA during diastole, consistent with  significant thickening. Moderate pericardial effusion. The pericardial  effusion is circumferential.   6. The mitral valve is grossly normal. Trivial mitral valve  regurgitation. No evidence of mitral stenosis.   7. The aortic valve is grossly normal. Aortic valve regurgitation is not  visualized. No aortic stenosis is present.   8. The inferior vena cava is normal in size with <50% respiratory  variability, suggesting right atrial pressure of 8 mmHg.   Conclusion(s)/Recommendation(s): With pericardial thickening, severely  elevated PA pressures, and history of lymphoma treatment (no mantle  radiation that I see, but focal palliative radiation to lung mass in 2017,  plus multiple rounds of chemotherapy),  concern would be for constriction causing pulmonary hypertension and right  sided heart failure. Tissue doppler signals on current study not well  aligned, but on prior studies appear normal, arguing against restriction.  Cannot exclude other possible  etiologies of pulmonary hypertension as well. Would recommend consulting  advanced heart failure to discuss right heart cath with constriction study  versus alternative workup.   Medications:    budesonide  (PULMICORT ) nebulizer solution  0.25 mg Nebulization BID  Chlorhexidine  Gluconate Cloth  6 each Topical Daily   enoxaparin  (LOVENOX ) injection  40 mg Subcutaneous Q24H   furosemide   40 mg Oral Daily   influenza vac split trivalent PF  0.5 mL Intramuscular Tomorrow-1000   sodium chloride  flush  10-40 mL Intracatheter Q12H      Assessment/Plan:   Heidi [name] is a 39 y.o. 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 Caleen.   Pericardial effusion:  ? Effusive constrictive dx with pulmonary HTN.  Discussed right heart cath with patient including simultaneous R/L pressure tracings to tease out PHT, constriction and elevated EDP.  Now at [facility] pending procedure Can have CHF consult at that time continue lasix  and add low dose coreg  for tachycardia. Dr Rolan aware and will likely to R/L heart cath with simultaneous RV/LV tracings    Heidi [name] 01/06/2025, 8:34 AM    "

## 2025-01-06 NOTE — Progress Notes (Incomplete)
 " PROGRESS NOTE    Heidi [name]  FMW:[id number] DOB: 1986/11/02 DOA: 01/02/2025 PCP: Pcp, No   Brief Narrative:  No notes on file     DVT prophylaxis: enoxaparin  (LOVENOX ) injection 40 mg Start: 01/02/25 2200    Code Status: Full Code Family Communication:   Disposition Plan:  Level of care: Telemetry Status is: Inpatient {Inpatient:23812}    Consultants:  ***  Procedures:  ***  Antimicrobials:  Anti-infectives (From admission, onward)    None        Subjective: ***  Objective: Vitals:   01/06/25 0017 01/06/25 0121 01/06/25 0343 01/06/25 0729  BP: 122/87  (!) 121/92 (!) 128/91  Pulse: (!) 131  (!) 119 (!) 115  Resp: 20  20 20   Temp: 98.1 F (36.7 C)  98.9 F (37.2 C) 97.6 F (36.4 C)  TempSrc: Oral  Oral Oral  SpO2: 95%  94% 96%  Weight:  65.1 kg    Height:        Intake/Output Summary (Last 24 hours) at 01/06/2025 0842 Last data filed at 01/05/2025 2205 Gross per 24 hour  Intake 597 ml  Output --  Net 597 ml   Filed Weights   01/04/25 0500 01/05/25 0441 01/06/25 0121  Weight: 69.3 kg 67.9 kg 65.1 kg    Examination: Physical Exam:  Constitutional: WN/WD, NAD and appears calm and comfortable Eyes: PERRL, lids and conjunctivae normal, sclerae anicteric  ENMT: External Ears, Nose appear normal. Grossly normal hearing. Mucous membranes are moist. Posterior pharynx clear of any exudate or lesions. Normal dentition.  Neck: Appears normal, supple, no cervical masses, normal ROM, no appreciable thyromegaly Respiratory: Clear to auscultation bilaterally, no wheezing, rales, rhonchi or crackles. Normal respiratory effort and patient is not tachypenic. No accessory muscle use.  Cardiovascular: RRR, no murmurs / rubs / gallops. S1 and S2 auscultated. No extremity edema. 2+ pedal pulses. No carotid bruits.  Abdomen: Soft, non-tender, non-distended. No masses palpated. No appreciable hepatosplenomegaly. Bowel sounds positive.  GU:  Deferred. Musculoskeletal: No clubbing / cyanosis of digits/nails. No joint deformity upper and lower extremities. Good ROM, no contractures. Normal strength and muscle tone.  Skin: No rashes, lesions, ulcers. No induration; Warm and dry.  Neurologic: CN 2-12 grossly intact with no focal deficits. Sensation intact in all 4 Extremities, DTR normal. Strength 5/5 in all 4. Romberg sign cerebellar reflexes not assessed.  Psychiatric: Normal judgment and insight. Alert and oriented x 3. Normal mood and appropriate affect.   Data Reviewed: I have personally reviewed following labs and imaging studies  CBC: Recent Labs  Lab 01/02/25 1256 01/02/25 1306 01/03/25 0527 01/05/25 0344  WBC 3.3*  --  4.1 5.2  NEUTROABS 2.6  --   --   --   HGB 12.7 13.6 13.1 12.7  HCT 37.6 40.0 40.3 39.9  MCV 97.4  --  100.0 101.3*  PLT 199  --  214 216   Basic Metabolic Panel: Recent Labs  Lab 01/02/25 1256 01/02/25 1306 01/03/25 0527 01/05/25 0344  NA 134* 134* 134* 137  K 3.9 3.7 4.6 3.7  CL 96* 94* 95* 96*  CO2 26  --  29 34*  GLUCOSE 89 86 143* 99  BUN 12 12 13 12   CREATININE 0.90 1.00 1.01* 0.95  CALCIUM  8.5*  --  8.2* 8.5*  MG  --   --   --  1.9   GFR: Estimated Creatinine Clearance: 77.3 mL/min (by C-G formula based on SCr of 0.95 mg/dL). Liver Function Tests: Recent  Labs  Lab 01/02/25 1256 01/03/25 0527  AST 42* 38  ALT 31 34  ALKPHOS 68 70  BILITOT 1.3* 0.8  PROT 5.6* 6.1*  ALBUMIN  3.7 3.7   Recent Labs  Lab 01/02/25 1256  LIPASE 18   No results for input(s): AMMONIA in the last 168 hours. Coagulation Profile: No results for input(s): INR, PROTIME in the last 168 hours. Cardiac Enzymes: No results for input(s): CKTOTAL, CKMB, CKMBINDEX, TROPONINI in the last 168 hours. BNP (last 3 results) Recent Labs    01/02/25 1256  PROBNP 9,034.0*   HbA1C: No results for input(s): HGBA1C in the last 72 hours. CBG: No results for input(s): GLUCAP in the last 168  hours. Lipid Profile: No results for input(s): CHOL, HDL, LDLCALC, TRIG, CHOLHDL, LDLDIRECT in the last 72 hours. Thyroid  Function Tests: No results for input(s): TSH, T4TOTAL, FREET4, T3FREE, THYROIDAB in the last 72 hours. Anemia Panel: No results for input(s): VITAMINB12, FOLATE, FERRITIN, TIBC, IRON, RETICCTPCT in the last 72 hours. Sepsis Labs: No results for input(s): PROCALCITON, LATICACIDVEN in the last 168 hours.  No results found for this or any previous visit (from the past 240 hours).   Radiology Studies: No results found.   Scheduled Meds:  budesonide  (PULMICORT ) nebulizer solution  0.25 mg Nebulization BID   carvedilol   3.125 mg Oral BID WC   Chlorhexidine  Gluconate Cloth  6 each Topical Daily   enoxaparin  (LOVENOX ) injection  40 mg Subcutaneous Q24H   furosemide   40 mg Oral Daily   influenza vac split trivalent PF  0.5 mL Intramuscular Tomorrow-1000   sodium chloride  flush  10-40 mL Intracatheter Q12H   Continuous Infusions:   LOS: 3 days    Time spent: *** minutes spent on chart review, discussion with nursing staff, consultants, updating family and interview/physical exam; more than 50% of that time was spent in counseling and/or coordination of care.   Heidi Lazarus Marker, DO [facility] Available via Epic secure chat 7am-7pm After these hours, please refer to coverage provider listed on [email or web address] 01/06/2025, 8:42 AM   "

## 2025-01-06 NOTE — Plan of Care (Signed)
  Problem: Education: Goal: Knowledge of General Education information will improve Description: Including pain rating scale, medication(s)/side effects and non-pharmacologic comfort measures Outcome: Progressing   Problem: Clinical Measurements: Goal: Respiratory complications will improve Outcome: Progressing   Problem: Clinical Measurements: Goal: Cardiovascular complication will be avoided Outcome: Progressing   

## 2025-01-06 NOTE — Hospital Course (Addendum)
 The patient is a 39 year old female with PMH of asthma, Hodgkin's lymphoma, noncompliance, pericardial effusion presented with worsening abdominal distention, shortness of breath. CT abdomen/pelvis with contrast showed diffusely heterogeneous appearance of the hepatic parenchyma, concerning for metastatic disease and MRI was recommended.  MRI showed no focal liver lesion.  She was started on IV Lasix .  TTE showed EF of 65 to 70% with moderately reduced RV function with severe pulmonary hypertension and moderate pericardial effusion without tamponade.  Cardiology was consulted and she is being diuresed and now plan is for [facility] on 01/07/25.  Assessment and Plan:  Acute Respiratory Failure with Hypoxia: Most likely from volume overload with elevated BNP, ascites, and anasarca. CTA chest was negative for PE. TTE showed elevated RV filling pressures and severe pulmonary HTN. C/w Albuterol  2.5 mg Neb q4hprn Wheezing and SOB, Budesonide  0.25 mg Neb BID; Yesterday per RN, her O2 sats dropped to 70% on room air and she was placed back on 3L O2. Repeat CXR in the AM and will need an Ambulatory Home O2 Screen prior to D/C   Pericardial Effusion: Moderate on TTE, no tamponade physiology. Previously also noted to have moderate pericardial effusion. 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 Cardiology Consulted and she is going to get R/L Heart Cath w/ Simultaneous RV/LV Tracings.  Cardiology can have a CHF consult at the time of her pending right and left heart cath.  Cardiology recommended continuing Furosemide  40 mg po Daily and adding low-dose Carvedilol  3.125 mg po BID for her tachycardia. ANA Negative, RA Latix Turbid was <10.0 and ESR was 3  Severe Pulmonary Hypertension:TTE findings as above, had elevated PASP of 64.2 and flattened interventricular septum.  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 Continuing po Furosemide  40 mg po Daily. She has an appointment with advanced CHF clinic on 1/27. CHF team to evaluate in the AM  Hypomagnesemia: Mag Level is 1.3. Replete w/ IV Mag Sulfate 4 grams. CTM and Replete as Necessary. Repeat Mag Level in the AM   Hodgkin's Lymphoma w/ Extensive Lymphadenopathy and SVC Syndrome, Liver Disease: CT abd showed diffusely heterogeneous appearance of the hepatic parenchyma, which could be due to recent chemotherapy or hepatic congestion, but there is also concern for metastatic disease. MRI completed and no focal liver lesion seen (the appearance on the CT was likely due to phase of contrast) -Will need f/u with Dr. 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 Will need f/u with oncology    Leukopenia: Resolved. WBC Trend:  Recent Labs  Lab 12/11/24 1911 01/02/25 1256 01/03/25 0527 01/05/25 0344 01/06/25 1035  WBC 3.8* 3.3* 4.1 5.2 4.5  -CTM and Trend and Repeat CMP in the AM   ? HTN: Per patient no prior diagnosis and lisinopril  was discontinued, she is normotensive. CTM BP per Protocol. Last BP Reading was  Hyperbilirubinemia: See Above. Likely from Hodgkin's Lymphoma and Liver Disease. T Bili is now 1.3. CTM & Trend & Repeat CMP in the AM

## 2025-01-06 NOTE — Progress Notes (Signed)
 " PROGRESS NOTE    Heidi [name]  FMW:[id number] DOB: 1986-02-28 DOA: 01/02/2025 PCP: Pcp, No   Brief Narrative:  The patient is a 39 year old female with PMH of asthma, Hodgkin's lymphoma, noncompliance, pericardial effusion presented with worsening abdominal distention, shortness of breath. CT abdomen/pelvis with contrast showed diffusely heterogeneous appearance of the hepatic parenchyma, concerning for metastatic disease and MRI was recommended.  MRI showed no focal liver lesion.  She was started on IV Lasix .  TTE showed EF of 65 to 70% with moderately reduced RV function with severe pulmonary hypertension and moderate pericardial effusion without tamponade.  Cardiology was consulted and she is being diuresed and now plan is for [facility] on 01/07/25.  Assessment and Plan:  Acute Respiratory Failure with Hypoxia: Most likely from volume overload with elevated BNP, ascites, and anasarca. CTA chest was negative for PE. TTE showed elevated RV filling pressures and severe pulmonary HTN. C/w Albuterol  2.5 mg Neb q4hprn Wheezing and SOB, Budesonide  0.25 mg Neb BID; Yesterday per RN, her O2 sats dropped to 70% on room air and she was placed back on 3L O2. Repeat CXR in the AM and will need an Ambulatory Home O2 Screen prior to D/C   Pericardial Effusion: Moderate on TTE, no tamponade physiology. Previously also noted to have moderate pericardial effusion. 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 Cardiology Consulted and she is going to get R/L Heart Cath w/ Simultaneous RV/LV Tracings.  Cardiology can have a CHF consult at the time of her pending right and left heart cath.  Cardiology recommended continuing Furosemide  40 mg po Daily and adding low-dose Carvedilol  3.125 mg po BID for her tachycardia. ANA Negative, RA Latix Turbid was <10.0 and ESR was 3  Severe Pulmonary Hypertension:TTE findings as above, had elevated PASP of 64.2 and flattened interventricular septum.  Suspect  to be from constrictive pericarditis versus primary insult to the lungs from prior radiation treatments -Will need right heart cath; Cardiology recommending diuresing over the weekend and right heart cath on Monday, but patient was very eager to go home and prefer to have this done as outpatient; Prior to going home she became Dyspenic and had to be placed on O2 and was agreeable to get it done so she was transferred to [facility]. Continuing po Furosemide  40 mg po Daily. She has an appointment with advanced CHF clinic on 1/27. CHF team to evaluate in the AM  Hypomagnesemia: Mag Level is 1.3. Replete w/ IV Mag Sulfate 4 grams. CTM and Replete as Necessary. Repeat Mag Level in the AM   Hodgkin's Lymphoma w/ Extensive Lymphadenopathy and SVC Syndrome, Liver Disease: CT abd showed diffusely heterogeneous appearance of the hepatic parenchyma, which could be due to recent chemotherapy or hepatic congestion, but there is also concern for metastatic disease. MRI completed and no focal liver lesion seen (the appearance on the CT was likely due to phase of contrast) -Will need f/u with Dr. 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 Will need f/u with oncology    Leukopenia: Resolved. WBC Trend:  Recent Labs  Lab 12/11/24 1911 01/02/25 1256 01/03/25 0527 01/05/25 0344 01/06/25 1035  WBC 3.8* 3.3* 4.1 5.2 4.5  -CTM and Trend and Repeat CMP in the AM   ? HTN: Per patient no prior diagnosis and lisinopril  was discontinued, she is normotensive. CTM BP per Protocol. Last BP Reading was  Hyperbilirubinemia: See Above. Likely from Hodgkin's Lymphoma and  Liver Disease. T Bili is now 1.3. CTM & Trend & Repeat CMP in the AM    DVT prophylaxis: enoxaparin  (LOVENOX ) injection 40 mg Start: 01/02/25 2200    Code Status: Full Code Family Communication: No family present @ bedside   Disposition Plan:  Level of care: Telemetry Status is: Inpatient Remains inpatient  appropriate because: Needs further clinical improvement, workup, and clearance by the specialists.    Consultants:  Cardiology   Procedures:  As delineated as above   Antimicrobials:  Anti-infectives (From admission, onward)    None       Subjective: Seen and examined at bedside he was still feeling short of breath.  Feels a little bit better.  Asking about what time her cath is.  No nausea or vomiting.  No other concerns or complaint at this time.  Objective: Vitals:   01/06/25 0729 01/06/25 1200 01/06/25 1556 01/06/25 1834  BP: (!) 128/91 121/89 (!) 133/95   Pulse: (!) 115 (!) 118 (!) 113   Resp: 20 18 20 20   Temp: 97.6 F (36.4 C) 97.6 F (36.4 C) 97.7 F (36.5 C)   TempSrc: Oral Oral Oral   SpO2: 96% 94% 94% 95%  Weight:      Height:        Intake/Output Summary (Last 24 hours) at 01/06/2025 1950 Last data filed at 01/06/2025 1700 Gross per 24 hour  Intake 360 ml  Output 1400 ml  Net -1040 ml   Filed Weights   01/04/25 0500 01/05/25 0441 01/06/25 0121  Weight: 69.3 kg 67.9 kg 65.1 kg   Examination: Physical Exam:  Constitutional: Chronically ill-appearing African-American female in no acute distress Respiratory: Diminished to auscultation bilaterally with some coarse breath sounds and has some crackles with some slight rhonchi.  Wearing supplemental oxygen via nasal cannula.  Has a normal respiratory effort and is not tachypneic.,  Cardiovascular: RRR, no murmurs / rubs / gallops. S1 and S2 auscultated.  Mild extremity edema Abdomen: Soft, non-tender, non-distended. Bowel sounds positive.  GU: Deferred. Musculoskeletal: No clubbing / cyanosis of digits/nails. No joint deformity upper and lower extremities.  Skin: No rashes, lesions, ulcers on a limited skin evaluation. No induration; Warm and dry.  Neurologic: CN 2-12 grossly intact with no focal deficits. Romberg sign and cerebellar reflexes not assessed.  Psychiatric: Normal judgment and insight. Alert and  oriented x 3. Normal mood and appropriate affect.   Data Reviewed: I have personally reviewed following labs and imaging studies  CBC: Recent Labs  Lab 01/02/25 1256 01/02/25 1306 01/03/25 0527 01/05/25 0344 01/06/25 1035  WBC 3.3*  --  4.1 5.2 4.5  NEUTROABS 2.6  --   --   --  3.6  HGB 12.7 13.6 13.1 12.7 12.3  HCT 37.6 40.0 40.3 39.9 37.7  MCV 97.4  --  100.0 101.3* 98.4  PLT 199  --  214 216 209   Basic Metabolic Panel: Recent Labs  Lab 01/02/25 1256 01/02/25 1306 01/03/25 0527 01/05/25 0344 01/06/25 0546 01/06/25 1035  NA 134* 134* 134* 137 136 135  K 3.9 3.7 4.6 3.7 3.6 3.7  CL 96* 94* 95* 96* 92* 91*  CO2 26  --  29 34* 39* 36*  GLUCOSE 89 86 143* 99 109* 99  BUN 12 12 13 12 10 9   CREATININE 0.90 1.00 1.01* 0.95 0.82 0.85  CALCIUM  8.5*  --  8.2* 8.5* 8.2* 8.6*  MG  --   --   --  1.9 1.4* 1.3*  PHOS  --   --   --   --   --  2.9   GFR: Estimated Creatinine Clearance: 86.4 mL/min (by C-G formula based on SCr of 0.85 mg/dL). Liver Function Tests: Recent Labs  Lab 01/02/25 1256 01/03/25 0527 01/06/25 1035  AST 42* 38 23  ALT 31 34 23  ALKPHOS 68 70 75  BILITOT 1.3* 0.8 1.3*  PROT 5.6* 6.1* 5.8*  ALBUMIN  3.7 3.7 3.6   Recent Labs  Lab 01/02/25 1256  LIPASE 18   No results for input(s): AMMONIA in the last 168 hours. Coagulation Profile: No results for input(s): INR, PROTIME in the last 168 hours. Cardiac Enzymes: No results for input(s): CKTOTAL, CKMB, CKMBINDEX, TROPONINI in the last 168 hours. BNP (last 3 results) Recent Labs    01/02/25 1256  PROBNP 9,034.0*   HbA1C: No results for input(s): HGBA1C in the last 72 hours. CBG: No results for input(s): GLUCAP in the last 168 hours. Lipid Profile: No results for input(s): CHOL, HDL, LDLCALC, TRIG, CHOLHDL, LDLDIRECT in the last 72 hours. Thyroid  Function Tests: No results for input(s): TSH, T4TOTAL, FREET4, T3FREE, THYROIDAB in the last 72  hours. Anemia Panel: No results for input(s): VITAMINB12, FOLATE, FERRITIN, TIBC, IRON, RETICCTPCT in the last 72 hours. Sepsis Labs: No results for input(s): PROCALCITON, LATICACIDVEN in the last 168 hours.  No results found for this or any previous visit (from the past 240 hours).   Radiology Studies: No results found.  Scheduled Meds:  budesonide  (PULMICORT ) nebulizer solution  0.25 mg Nebulization BID   carvedilol   3.125 mg Oral BID WC   Chlorhexidine  Gluconate Cloth  6 each Topical Daily   enoxaparin  (LOVENOX ) injection  40 mg Subcutaneous Q24H   furosemide   40 mg Oral Daily   influenza vac split trivalent PF  0.5 mL Intramuscular Tomorrow-1000   sodium chloride  flush  10-40 mL Intracatheter Q12H   Continuous Infusions:  magnesium  sulfate bolus IVPB      LOS: 3 days   Heidi Marker, DO [facility] Available via Epic secure chat 7am-7pm After these hours, please refer to coverage provider listed on [email or web address] 01/06/2025, 7:50 PM  "

## 2025-01-07 ENCOUNTER — Encounter: Payer: Self-pay | Admitting: Hematology

## 2025-01-07 ENCOUNTER — Inpatient Hospital Stay (HOSPITAL_COMMUNITY)

## 2025-01-07 ENCOUNTER — Telehealth (HOSPITAL_COMMUNITY): Payer: Self-pay

## 2025-01-07 ENCOUNTER — Encounter (HOSPITAL_COMMUNITY): Payer: Self-pay | Admitting: Cardiology

## 2025-01-07 ENCOUNTER — Encounter (HOSPITAL_COMMUNITY): Admission: EM | Payer: Self-pay | Source: Home / Self Care

## 2025-01-07 ENCOUNTER — Other Ambulatory Visit (HOSPITAL_COMMUNITY): Payer: Self-pay

## 2025-01-07 DIAGNOSIS — C8190 Hodgkin lymphoma, unspecified, unspecified site: Secondary | ICD-10-CM

## 2025-01-07 DIAGNOSIS — R7989 Other specified abnormal findings of blood chemistry: Secondary | ICD-10-CM | POA: Diagnosis not present

## 2025-01-07 DIAGNOSIS — D72819 Decreased white blood cell count, unspecified: Secondary | ICD-10-CM

## 2025-01-07 DIAGNOSIS — I509 Heart failure, unspecified: Secondary | ICD-10-CM

## 2025-01-07 DIAGNOSIS — J90 Pleural effusion, not elsewhere classified: Secondary | ICD-10-CM | POA: Diagnosis not present

## 2025-01-07 DIAGNOSIS — J9601 Acute respiratory failure with hypoxia: Secondary | ICD-10-CM | POA: Diagnosis not present

## 2025-01-07 DIAGNOSIS — R160 Hepatomegaly, not elsewhere classified: Secondary | ICD-10-CM

## 2025-01-07 HISTORY — PX: RIGHT AND LEFT HEART CATH: CATH118262

## 2025-01-07 LAB — COMPREHENSIVE METABOLIC PANEL WITH GFR
ALT: 21 U/L (ref 0–44)
AST: 21 U/L (ref 15–41)
Albumin: 3.5 g/dL (ref 3.5–5.0)
Alkaline Phosphatase: 85 U/L (ref 38–126)
Anion gap: 6 (ref 5–15)
BUN: 8 mg/dL (ref 6–20)
CO2: 39 mmol/L — ABNORMAL HIGH (ref 22–32)
Calcium: 7.9 mg/dL — ABNORMAL LOW (ref 8.9–10.3)
Chloride: 91 mmol/L — ABNORMAL LOW (ref 98–111)
Creatinine, Ser: 0.75 mg/dL (ref 0.44–1.00)
GFR, Estimated: >60 mL/min
Glucose, Bld: 90 mg/dL (ref 70–99)
Potassium: 3.7 mmol/L (ref 3.5–5.1)
Sodium: 136 mmol/L (ref 135–145)
Total Bilirubin: 1.5 mg/dL — ABNORMAL HIGH (ref 0.0–1.2)
Total Protein: 5.7 g/dL — ABNORMAL LOW (ref 6.5–8.1)

## 2025-01-07 LAB — POCT I-STAT EG7
Acid-Base Excess: 11 mmol/L — ABNORMAL HIGH (ref 0.0–2.0)
Acid-Base Excess: 12 mmol/L — ABNORMAL HIGH (ref 0.0–2.0)
Bicarbonate: 39.4 mmol/L — ABNORMAL HIGH (ref 20.0–28.0)
Bicarbonate: 40.3 mmol/L — ABNORMAL HIGH (ref 20.0–28.0)
Calcium, Ion: 1.15 mmol/L (ref 1.15–1.40)
Calcium, Ion: 1.17 mmol/L (ref 1.15–1.40)
HCT: 38 % (ref 36.0–46.0)
HCT: 38 % (ref 36.0–46.0)
Hemoglobin: 12.9 g/dL (ref 12.0–15.0)
Hemoglobin: 12.9 g/dL (ref 12.0–15.0)
O2 Saturation: 58 %
O2 Saturation: 62 %
Potassium: 3.5 mmol/L (ref 3.5–5.1)
Potassium: 3.6 mmol/L (ref 3.5–5.1)
Sodium: 133 mmol/L — ABNORMAL LOW (ref 135–145)
Sodium: 133 mmol/L — ABNORMAL LOW (ref 135–145)
TCO2: 42 mmol/L — ABNORMAL HIGH (ref 22–32)
TCO2: 42 mmol/L — ABNORMAL HIGH (ref 22–32)
pCO2, Ven: 71.2 mmHg (ref 44–60)
pCO2, Ven: 72.4 mmHg (ref 44–60)
pH, Ven: 7.352 (ref 7.25–7.43)
pH, Ven: 7.354 (ref 7.25–7.43)
pO2, Ven: 34 mmHg (ref 32–45)
pO2, Ven: 35 mmHg (ref 32–45)

## 2025-01-07 LAB — CBC WITH DIFFERENTIAL/PLATELET
Abs Immature Granulocytes: 0.02 K/uL (ref 0.00–0.07)
Basophils Absolute: 0 K/uL (ref 0.0–0.1)
Basophils Relative: 0 %
Eosinophils Absolute: 0 K/uL (ref 0.0–0.5)
Eosinophils Relative: 1 %
HCT: 36.3 % (ref 36.0–46.0)
Hemoglobin: 12 g/dL (ref 12.0–15.0)
Immature Granulocytes: 1 %
Lymphocytes Relative: 5 %
Lymphs Abs: 0.2 K/uL — ABNORMAL LOW (ref 0.7–4.0)
MCH: 32.4 pg (ref 26.0–34.0)
MCHC: 33.1 g/dL (ref 30.0–36.0)
MCV: 98.1 fL (ref 80.0–100.0)
Monocytes Absolute: 0.6 K/uL (ref 0.1–1.0)
Monocytes Relative: 14 %
Neutro Abs: 3.4 K/uL (ref 1.7–7.7)
Neutrophils Relative %: 79 %
Platelets: 222 K/uL (ref 150–400)
RBC: 3.7 MIL/uL — ABNORMAL LOW (ref 3.87–5.11)
RDW: 12.7 % (ref 11.5–15.5)
WBC: 4.2 K/uL (ref 4.0–10.5)
nRBC: 0 % (ref 0.0–0.2)

## 2025-01-07 LAB — PHOSPHORUS: Phosphorus: 2.7 mg/dL (ref 2.5–4.6)

## 2025-01-07 LAB — MAGNESIUM: Magnesium: 2.1 mg/dL (ref 1.7–2.4)

## 2025-01-07 MED ORDER — LIDOCAINE HCL (PF) 1 % IJ SOLN
INTRAMUSCULAR | Status: DC | PRN
  Administered 2025-01-07: 12 mL
  Administered 2025-01-07: 5 mL
  Administered 2025-01-07: 2 mL

## 2025-01-07 MED ORDER — HEPARIN SODIUM (PORCINE) 1000 UNIT/ML IJ SOLN
INTRAMUSCULAR | Status: AC
  Filled 2025-01-07: qty 10

## 2025-01-07 MED ORDER — FUROSEMIDE 10 MG/ML IJ SOLN
80.0000 mg | Freq: Two times a day (BID) | INTRAMUSCULAR | Status: DC
  Administered 2025-01-07 (×2): 80 mg via INTRAVENOUS
  Filled 2025-01-07 (×2): qty 8

## 2025-01-07 MED ORDER — MIDAZOLAM HCL 2 MG/2ML IJ SOLN
INTRAMUSCULAR | Status: AC
  Filled 2025-01-07: qty 2

## 2025-01-07 MED ORDER — IOHEXOL 350 MG/ML SOLN
INTRAVENOUS | Status: DC | PRN
  Administered 2025-01-07: 5 mL

## 2025-01-07 MED ORDER — POTASSIUM CHLORIDE CRYS ER 20 MEQ PO TBCR
40.0000 meq | EXTENDED_RELEASE_TABLET | Freq: Two times a day (BID) | ORAL | Status: DC
  Administered 2025-01-07 (×2): 40 meq via ORAL
  Filled 2025-01-07 (×2): qty 2

## 2025-01-07 MED ORDER — HEPARIN (PORCINE) IN NACL 2000-0.9 UNIT/L-% IV SOLN
INTRAVENOUS | Status: DC | PRN
  Administered 2025-01-07: 1000 mL

## 2025-01-07 MED ORDER — LIDOCAINE HCL (PF) 1 % IJ SOLN
INTRAMUSCULAR | Status: AC
  Filled 2025-01-07: qty 30

## 2025-01-07 MED ORDER — ACETAMINOPHEN 325 MG PO TABS: 650.0000 mg | ORAL_TABLET | ORAL | Status: DC | PRN

## 2025-01-07 MED ORDER — SODIUM CHLORIDE 0.9% FLUSH: 3.0000 mL | INTRAVENOUS | Status: DC | PRN

## 2025-01-07 MED ORDER — DAPAGLIFLOZIN PROPANEDIOL 10 MG PO TABS
10.0000 mg | ORAL_TABLET | Freq: Every day | ORAL | Status: DC
  Administered 2025-01-07 – 2025-01-08 (×2): 10 mg via ORAL
  Filled 2025-01-07 (×2): qty 1

## 2025-01-07 MED ORDER — HYDRALAZINE HCL 20 MG/ML IJ SOLN: 10.0000 mg | INTRAMUSCULAR | Status: AC | PRN

## 2025-01-07 MED ORDER — VERAPAMIL HCL 2.5 MG/ML IV SOLN
INTRAVENOUS | Status: AC
  Filled 2025-01-07: qty 2

## 2025-01-07 MED ORDER — FREE WATER
250.0000 mL | Freq: Once | Status: AC
  Administered 2025-01-07: 250 mL via ORAL

## 2025-01-07 MED ORDER — ENOXAPARIN SODIUM 40 MG/0.4ML IJ SOSY
40.0000 mg | PREFILLED_SYRINGE | INTRAMUSCULAR | Status: DC
  Administered 2025-01-08: 40 mg via SUBCUTANEOUS
  Filled 2025-01-07: qty 0.4

## 2025-01-07 MED ORDER — ONDANSETRON HCL 4 MG/2ML IJ SOLN: 4.0000 mg | Freq: Four times a day (QID) | INTRAMUSCULAR | Status: DC | PRN

## 2025-01-07 MED ORDER — MIDAZOLAM HCL (PF) 2 MG/2ML IJ SOLN
INTRAMUSCULAR | Status: DC | PRN
  Administered 2025-01-07 (×3): 1 mg via INTRAVENOUS

## 2025-01-07 MED ORDER — SODIUM CHLORIDE 0.9% FLUSH
3.0000 mL | Freq: Two times a day (BID) | INTRAVENOUS | Status: DC
  Administered 2025-01-07 (×2): 3 mL via INTRAVENOUS

## 2025-01-07 MED ORDER — HEPARIN SODIUM (PORCINE) 1000 UNIT/ML IJ SOLN
INTRAMUSCULAR | Status: DC | PRN
  Administered 2025-01-07: 3000 [IU] via INTRAVENOUS

## 2025-01-07 MED ORDER — FENTANYL CITRATE (PF) 100 MCG/2ML IJ SOLN
INTRAMUSCULAR | Status: AC
  Filled 2025-01-07: qty 2

## 2025-01-07 MED ORDER — SODIUM CHLORIDE 0.9 % IV SOLN: 250.0000 mL | INTRAVENOUS | Status: AC | PRN

## 2025-01-07 MED ORDER — COLCHICINE 0.6 MG PO TABS
0.6000 mg | ORAL_TABLET | Freq: Every day | ORAL | Status: DC
  Administered 2025-01-07 – 2025-01-08 (×2): 0.6 mg via ORAL
  Filled 2025-01-07 (×2): qty 1

## 2025-01-07 MED ORDER — LABETALOL HCL 5 MG/ML IV SOLN: 10.0000 mg | INTRAVENOUS | Status: AC | PRN

## 2025-01-07 MED ORDER — VERAPAMIL HCL 2.5 MG/ML IV SOLN
INTRAVENOUS | Status: DC | PRN
  Administered 2025-01-07: 10 mL via INTRA_ARTERIAL

## 2025-01-07 MED ORDER — FENTANYL CITRATE (PF) 100 MCG/2ML IJ SOLN
INTRAMUSCULAR | Status: DC | PRN
  Administered 2025-01-07 (×3): 25 ug via INTRAVENOUS

## 2025-01-07 NOTE — Plan of Care (Signed)
  Problem: Education: Goal: Knowledge of General Education information will improve Description Including pain rating scale, medication(s)/side effects and non-pharmacologic comfort measures Outcome: Progressing   Problem: Clinical Measurements: Goal: Cardiovascular complication will be avoided Outcome: Progressing   Problem: Clinical Measurements: Goal: Respiratory complications will improve Outcome: Progressing   

## 2025-01-07 NOTE — Progress Notes (Signed)
 " PROGRESS NOTE    Heidi [name]  FMW:[id number] DOB: 04-09-1986 DOA: 01/02/2025 PCP: Pcp, No   Brief Narrative:  The patient is a 39 year old female with PMH of asthma, Hodgkin's lymphoma, noncompliance, pericardial effusion presented with worsening abdominal distention, shortness of breath. CT abdomen/pelvis with contrast showed diffusely heterogeneous appearance of the hepatic parenchyma, concerning for metastatic disease and MRI was recommended.  MRI showed no focal liver lesion.  She was started on IV Lasix .  TTE showed EF of 65 to 70% with moderately reduced RV function with severe pulmonary hypertension and moderate pericardial effusion without tamponade.  Cardiology was consulted and she is being diuresed and now plan is for [facility] on 01/07/25.  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  Cardiology also feels that she has significant pulmonary hypertension and is mixed pulmonary venous and arterial and they are recommending diuresis and working her up fully for constrictive physiology with a cardiac MRI after she is diuresed.  Assessment and Plan:  Acute Respiratory Failure with Hypoxia: Most likely from volume overload with elevated BNP, ascites, and anasarca and see below. CTA chest was negative for PE. TTE showed elevated RV filling pressures and severe pulmonary HTN. C/w Albuterol  2.5 mg Neb q4hprn Wheezing and SOB, Budesonide  0.25 mg Neb BID; Yesterday per RN, her O2 sats dropped to 70% on room air and she was placed back on 3L O2. Repeat CXR in the AM and will need an Ambulatory Home O2 Screen prior to D/C her remained volume overloaded and cardiology has now started her on IV Lasix  80 mg twice daily   Pericardial Effusion and Disease: Moderate on TTE, no tamponade physiology. Previously also noted to have moderate  pericardial effusion. 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 Cardiology Consulted and she is going to get R/L Heart Cath w/ Simultaneous RV/LV Tracings and s lower suggestive of ventricular interdependence and did not give evidence for constrictive physiology -Cardiology now recommending diuresing her and then getting cardiac MRI when free breathing sequences for further evaluation of constrictive physiology -Adding low-dose Carvedilol  3.125 mg po BID for her tachycardia.  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  She was on p.o. furosemide  40 mg daily -Right and left heart cath done and showed Elevated right and left heart filling pressures. Severe mixed pulmonary arterial/pulmonary venous hypertension. Low CI by thermo, higher by Fick. Simultaneous LV/RV pressure tracings were not suggestive of ventricular interdependence (LV and RV pressures track together).  Equalization of diastolic pressures was not present.  This was not suggestive of effusive/constrictive pericarditis. -Cardiology recommending diuresis as above and completing autoimmune workup and they are going to check anti-SCL 70, anticentromere antibodies and completing restrictive physiology workup with cardiac MRI -Cardiology  wants to repeat right heart  cath after diuresis   Hypomagnesemia: Mag Level is now 2.1. CTM and Replete as Necessary. Repeat Mag Level in the AM   Hodgkin's Lymphoma w/ Extensive Lymphadenopathy and SVC Syndrome, Liver Disease: CT abd showed diffusely heterogeneous appearance of the hepatic parenchyma, which could be due to recent chemotherapy or hepatic congestion, but there is also concern for metastatic disease. MRI completed and no focal liver lesion seen (the appearance on the CT was likely due to phase of contrast) -Will need f/u with Dr. 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 Will need f/u with oncology    Leukopenia: Resolved. WBC Trend:  Recent Labs  Lab 12/11/24 1911 01/02/25 1256 01/03/25 0527 01/05/25 0344 01/06/25 1035 01/07/25 0502  WBC 3.8* 3.3* 4.1 5.2 4.5 4.2  -CTM and Trend and Repeat CMP in the AM   ? HTN: Per patient no prior diagnosis and lisinopril  was discontinued, she is normotensive. CTM BP per Protocol. Last BP Reading was 108/78  Hyperbilirubinemia: See Above. Likely from Hodgkin's Lymphoma and Liver Disease. T Bili is now 1.3 -> 1.5. CTM & Trend & Repeat CMP in the AM    DVT prophylaxis: enoxaparin  (LOVENOX ) injection 40 mg Start: 01/08/25 0800    Code Status: Full Code Family Communication: Discussed with her mom  Disposition Plan:  Level of care: Telemetry Status is: Inpatient Remains inpatient appropriate because: Needs further clinical improvement and cardiac clearance   Consultants:  Cardiology Advanced CHF team  Procedures:  Echo, right and left heart cath  Antimicrobials:  Anti-infectives (From admission, onward)    None       Subjective: Seen and examined after her cath and states that she is not in any pain.  Denied any  shortness of breath.  No nausea or vomiting.  Feels okay.  No other concerns or conversational.  Objective: Vitals:   01/07/25 1810 01/07/25 1900 01/07/25 1922 01/07/25 1944  BP: 105/80 108/76 108/78   Pulse:   (!) 108 (!) 101  Resp: 20 (!) 22 20 18   Temp:   98.5 F (36.9 C)   TempSrc:   Oral   SpO2: 97% 94% 95% 94%  Weight:      Height:        Intake/Output Summary (Last 24 hours) at 01/07/2025 2037 Last data filed at 01/07/2025 2000 Gross per 24 hour  Intake 1590 ml  Output 5250 ml  Net -3660 ml   Filed Weights   01/05/25 0441 01/06/25 0121 01/07/25 0500  Weight: 67.9 kg 65.1 kg 64.8 kg   Examination: Physical Exam:  Constitutional: Chronically ill-appearing African-American female who is resting Respiratory: Diminished to auscultation bilaterally with some coarse breath sounds and some crackles, no wheezing, rales, rhonchi. Normal respiratory effort and patient is not tachypenic. No accessory muscle use.  No supplemental oxygen nasal cannula Cardiovascular: Tachycardic rate but regular rhythm, no murmurs / rubs / gallops. S1 and S2 auscultated. Mild extremity edema Abdomen: Soft, non-tender, non-distended. Bowel sounds positive.  GU: Deferred. Musculoskeletal: No clubbing / cyanosis of digits/nails. No joint deformity upper and lower extremities. Skin: No rashes, lesions, ulcers on a limited skin evaluation. No induration; Warm and dry.  Neurologic: CN 2-12 grossly intact with no focal deficits. Romberg sign and cerebellar reflexes not assessed.  Psychiatric: Normal judgment and  insight. Alert and oriented x 3. Normal mood and appropriate affect.   Data Reviewed: I have personally reviewed following labs and imaging studies  CBC: Recent Labs  Lab 01/02/25 1256 01/02/25 1306 01/03/25 0527 01/05/25 0344 01/06/25 1035 01/07/25 0502 01/07/25 0830 01/07/25 0831  WBC 3.3*  --  4.1 5.2 4.5 4.2  --   --   NEUTROABS 2.6  --   --   --  3.6 3.4  --   --   HGB 12.7   < >  13.1 12.7 12.3 12.0 12.9 12.9  HCT 37.6   < > 40.3 39.9 37.7 36.3 38.0 38.0  MCV 97.4  --  100.0 101.3* 98.4 98.1  --   --   PLT 199  --  214 216 209 222  --   --    < > = values in this interval not displayed.   Basic Metabolic Panel: Recent Labs  Lab 01/03/25 0527 01/05/25 0344 01/06/25 0546 01/06/25 1035 01/07/25 0502 01/07/25 0830 01/07/25 0831  NA 134* 137 136 135 136 133* 133*  K 4.6 3.7 3.6 3.7 3.7 3.5 3.6  CL 95* 96* 92* 91* 91*  --   --   CO2 29 34* 39* 36* 39*  --   --   GLUCOSE 143* 99 109* 99 90  --   --   BUN 13 12 10 9 8   --   --   CREATININE 1.01* 0.95 0.82 0.85 0.75  --   --   CALCIUM  8.2* 8.5* 8.2* 8.6* 7.9*  --   --   MG  --  1.9 1.4* 1.3* 2.1  --   --   PHOS  --   --   --  2.9 2.7  --   --    GFR: Estimated Creatinine Clearance: 91.8 mL/min (by C-G formula based on SCr of 0.75 mg/dL). Liver Function Tests: Recent Labs  Lab 01/02/25 1256 01/03/25 0527 01/06/25 1035 01/07/25 0502  AST 42* 38 23 21  ALT 31 34 23 21  ALKPHOS 68 70 75 85  BILITOT 1.3* 0.8 1.3* 1.5*  PROT 5.6* 6.1* 5.8* 5.7*  ALBUMIN  3.7 3.7 3.6 3.5   Recent Labs  Lab 01/02/25 1256  LIPASE 18   No results for input(s): AMMONIA in the last 168 hours. Coagulation Profile: No results for input(s): INR, PROTIME in the last 168 hours. Cardiac Enzymes: No results for input(s): CKTOTAL, CKMB, CKMBINDEX, TROPONINI in the last 168 hours. BNP (last 3 results) Recent Labs    01/02/25 1256  PROBNP 9,034.0*   HbA1C: No results for input(s): HGBA1C in the last 72 hours. CBG: No results for input(s): GLUCAP in the last 168 hours. Lipid Profile: No results for input(s): CHOL, HDL, LDLCALC, TRIG, CHOLHDL, LDLDIRECT in the last 72 hours. Thyroid  Function Tests: No results for input(s): TSH, T4TOTAL, FREET4, T3FREE, THYROIDAB in the last 72 hours. Anemia Panel: No results for input(s): VITAMINB12, FOLATE, FERRITIN, TIBC, IRON, RETICCTPCT  in the last 72 hours. Sepsis Labs: No results for input(s): PROCALCITON, LATICACIDVEN in the last 168 hours.  No results found for this or any previous visit (from the past 240 hours).   Radiology Studies: DG CHEST PORT 1 VIEW Result Date: 01/07/2025 CLINICAL DATA:  Shortness of breath. EXAM: PORTABLE CHEST 1 VIEW COMPARISON:  Chest radiograph dated 01/02/2025. FINDINGS: Left-sided Port-A-Cath in similar position. Stable cardiomegaly with vascular congestion and edema similar or slightly worsened since the prior radiograph. Superimposed pneumonia is not excluded. Trace right pleural  effusions suspected. No pneumothorax. No acute osseous pathology. IMPRESSION: Cardiomegaly with vascular congestion and edema similar or slightly worsened since the prior radiograph. Electronically Signed   By: Vanetta Chou M.D.   On: 01/07/2025 18:42   CARDIAC CATHETERIZATION Result Date: 01/07/2025 1. Elevated right and left heart filling pressures. 2. Severe mixed pulmonary arterial/pulmonary venous hypertension. 3. Low CI by thermo, higher by Fick. 4. Simultaneous LV/RV pressure tracings were not suggestive of ventricular interdependence (LV and RV pressures track together).  Equalization of diastolic pressures was not present.  This was not suggestive of effusive/constrictive pericarditis.   Scheduled Meds:  budesonide  (PULMICORT ) nebulizer solution  0.25 mg Nebulization BID   carvedilol   3.125 mg Oral BID WC   Chlorhexidine  Gluconate Cloth  6 each Topical Daily   colchicine   0.6 mg Oral Daily   dapagliflozin  propanediol  10 mg Oral Daily   [START ON 01/08/2025] enoxaparin  (LOVENOX ) injection  40 mg Subcutaneous Q24H   furosemide   80 mg Intravenous BID   influenza vac split trivalent PF  0.5 mL Intramuscular Tomorrow-1000   potassium chloride   40 mEq Oral BID   sodium chloride  flush  10-40 mL Intracatheter Q12H   sodium chloride  flush  3 mL Intravenous Q12H   Continuous Infusions:  sodium chloride        LOS: 4 days   Alejandro Marker, DO [facility] Available via Epic secure chat 7am-7pm After these hours, please refer to coverage provider listed on [email or web address] 01/07/2025, 8:37 PM  "

## 2025-01-07 NOTE — Interval H&P Note (Signed)
 History and Physical Interval Note:  01/07/2025 7:59 AM  Heidi [name]  has presented today for surgery, with the diagnosis of HF.  The various methods of treatment have been discussed with the patient and family. After consideration of risks, benefits and other options for treatment, the patient has consented to  Procedures: RIGHT/LEFT HEART CATH AND CORONARY ANGIOGRAPHY (N/A) as a surgical intervention.  The patient's history has been reviewed, patient examined, no change in status, stable for surgery.  I have reviewed the patient's chart and labs.  Questions were answered to the patient's satisfaction.     Ronesha Heenan [employer]

## 2025-01-07 NOTE — Consult Note (Signed)
 "   Advanced Heart Failure Team Consult Note   Primary Physician: Pcp, No Cardiologist:  None HPI:    Heidi [name] is seen today for evaluation of pulmonary hypertension/?pericardial constriction at the request of Dr. Delford. Heidi [name] is a 39 y.o. female with history of Hodgkin's lymphoma, SVC syndrome s/p chest wall radiation, smoking, and CHF.  Hodgkin's lymphoma was found in 2017.  She was treated with several regimens of chemotherapy to induce remission.  Treatment complicated by noncompliance.  She refused consideration of stem cell transplant.  Treatment was also complicated by right lung mass causing SVC syndrome.  She had palliative radiation to lung mass. She is a smoker.  Echo in 7/24 showed moderate pericardial effusion, no mention of RV function.  Echo in 9/24 showed EF 65-70%, D-shaped interventricular septum (unable to estimate PA systolic pressure), small-moderate pericardial effusion, IVC dilated.   Prior to this admission, patient reports about 2 wks of progressive dyspnea as well as abdominal swelling and peripheral edema.  No chest pain.  No lightheadedness.  At admission, pro-BNP was elevated.  Echo showed EF 65-70%, D-shaped septum, mild RV enlargement with moderate RV dysfunction, PASP 64 mmHg, moderate pericardial effusion without tamponade, pericardial thickening, IVC normal in size but does not collapse. 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  Patient was started on po Lasix  and brought for RHC/hemodynamic LHC today.   RHC/hemodynamic LHC today: Hemodynamics (mmHg) RA mean 15 RV 73/21 PA 72/36, mean 53 PCWP mean 24 LV 128/14 AO 110/75 Oxygen saturations: PA 59% AO 92% Cardiac Output (Fick) 4.33  Cardiac Index (Fick) 2.47 PVR 6.7 WU  Cardiac Output (Thermo) 3.68  Cardiac Index (Thermo) 2.1 PVR 7.9 WU PAPi 2.4 Simultaneous LV/RV pressure tracings showed that the RV and LV pressures tracked together, there did not appear to be  ventricular interdependence.   Home Medications Prior to Admission medications  Medication Sig Start Date End Date Taking? Authorizing Provider  albuterol  (VENTOLIN  HFA) 108 (90 Base) MCG/ACT inhaler Inhale 1-2 puffs into the lungs every 6 (six) hours as needed for wheezing or shortness of breath. 12/21/24  Yes Raspet, Erin K, PA-C  budesonide -formoterol  (SYMBICORT ) 80-4.5 MCG/ACT inhaler Inhale 2 puffs into the lungs 2 (two) times daily as needed. Patient taking differently: Inhale 2 puffs into the lungs 2 (two) times daily as needed (for respiratory flares). 12/21/24  Yes Raspet, Erin K, PA-C  predniSONE  (STERAPRED UNI-PAK 21 TAB) 10 MG (21) TBPK tablet Take 40 mg (4 tablets) days 1 and 2, take 30 mg (3 tablets) days 3 and 4, take 20 mg (2 tablets) days 5 and 6, take 10 mg (1 tablet) days 7 and 8, take 5 mg (0.5 mg) days 9 and 10, then stop. Patient not taking: Reported on 01/02/2025 12/21/24   Raspet, Rocky POUR, PA-C    Past Medical History: Past Medical History:  Diagnosis Date   ARDS (adult respiratory distress syndrome) (HCC)    Asthma    Hodgkin lymphoma (HCC)    Hypertension     Past Surgical History: Past Surgical History:  Procedure Laterality Date   AXILLARY LYMPH NODE BIOPSY Right 03/19/2016   Procedure: AXILLARY LYMPH NODE BIOPSY;  Surgeon: Krystal Spinner, MD;  Location: [facility];  Service: General;  Laterality: Right;   IR GENERIC HISTORICAL  12/22/2016   IR FLUORO GUIDE PORT INSERTION LEFT 12/22/2016 [facility]   IR GENERIC HISTORICAL  12/22/2016   IR US  GUIDE VASC ACCESS LEFT 12/22/2016 [facility]   IR  GENERIC HISTORICAL  12/22/2016   IR CV LINE INJECTION 12/22/2016 [facility]   IR GENERIC HISTORICAL  12/22/2016   IR REMOVAL TUN ACCESS W/ PORT W/O FL MOD SED 12/22/2016 [facility]   VIDEO BRONCHOSCOPY Bilateral 11/26/2016   Procedure: VIDEO BRONCHOSCOPY WITH FLUORO;  Surgeon: Harden LULLA Staff, MD;  Location: [facility];  Service: Cardiopulmonary;  Laterality:  Bilateral;    Family History: Family History  Problem Relation Age of Onset   Hypertension Mother     Social History: Social History   Socioeconomic History   Marital status: Single    Spouse name: Not on file   Number of children: Not on file   Years of education: Not on file   Highest education level: Not on file  Occupational History   Not on file  Tobacco Use   Smoking status: Some Days    Current packs/day: 0.00    Average packs/day: 0.5 packs/day for 15.0 years (7.5 ttl pk-yrs)    Types: Cigarettes    Start date: 03/27/2001    Last attempt to quit: 03/27/2016    Years since quitting: 8.7   Smokeless tobacco: Never  Vaping Use   Vaping status: Never Used  Substance and Sexual Activity   Alcohol use: Yes    Comment: occasional now   Drug use: Not Currently   Sexual activity: Yes    Birth control/protection: None  Other Topics Concern   Not on file  Social History Narrative   Not on file   Social Drivers of Health   Tobacco Use: High Risk (01/02/2025)   Patient History    Smoking Tobacco Use: Some Days    Smokeless Tobacco Use: Never    Passive Exposure: Not on file  Financial Resource Strain: Not on file  Food Insecurity: No Food Insecurity (01/03/2025)   Epic    Worried About Programme Researcher, Broadcasting/film/video in the Last Year: Never true    Ran Out of Food in the Last Year: Never true  Transportation Needs: No Transportation Needs (01/03/2025)   Epic    Lack of Transportation (Medical): No    Lack of Transportation (Non-Medical): No  Physical Activity: Not on file  Stress: Not on file  Social Connections: Not on file  Depression (EYV7-0): Not on file  Alcohol Screen: Not on file  Housing: Unknown (01/03/2025)   Epic    Unable to Pay for Housing in the Last Year: No    Number of Times Moved in the Last Year: Not on file    Homeless in the Last Year: No  Utilities: Not At Risk (01/03/2025)   Epic    Threatened with loss of utilities: No  Health Literacy: Not on file     Allergies:  Allergies[1]  Objective:   Vital Signs:   Temp:  [97.6 F (36.4 C)-98.2 F (36.8 C)] 98.2 F (36.8 C) (01/12 0720) Pulse Rate:  [97-158] 103 (01/12 0929) Resp:  [15-32] 20 (01/12 0929) BP: (99-139)/(73-99) 124/93 (01/12 0929) SpO2:  [85 %-97 %] 94 % (01/12 0929) Weight:  [64.8 kg] 64.8 kg (01/12 0500) Last BM Date : 01/02/25  Weight change: Filed Weights   01/05/25 0441 01/06/25 0121 01/07/25 0500  Weight: 67.9 kg 65.1 kg 64.8 kg    Intake/Output:   Intake/Output Summary (Last 24 hours) at 01/07/2025 1154 Last data filed at 01/07/2025 1142 Gross per 24 hour  Intake 1110 ml  Output 1400 ml  Net -290 ml    Physical Exam   General:  NAD Neck: JVP 12 cm, no thyromegaly or thyroid  nodule.  Lungs: Occasional rhonchi.  CV: Nondisplaced PMI.  Heart mildly tachy regular S1/S2, no S3/S4, no murmur. Trace ankle edema.  Abdomen: Soft, nontender, no hepatosplenomegaly, no distention.  Skin: Intact without lesions or rashes.  Neurologic: Alert and oriented x 3.  Psych: Normal affect. Extremities: No clubbing or cyanosis.  HEENT: Normal.   Telemetry   Sinus tachy 100s, personally reviewed  EKG    Sinus tachycardia, right axis deviation, low voltage (personally reviewed)  Labs   Basic Metabolic Panel: Recent Labs  Lab 01/03/25 0527 01/05/25 0344 01/06/25 0546 01/06/25 1035 01/07/25 0502  NA 134* 137 136 135 136  K 4.6 3.7 3.6 3.7 3.7  CL 95* 96* 92* 91* 91*  CO2 29 34* 39* 36* 39*  GLUCOSE 143* 99 109* 99 90  BUN 13 12 10 9 8   CREATININE 1.01* 0.95 0.82 0.85 0.75  CALCIUM  8.2* 8.5* 8.2* 8.6* 7.9*  MG  --  1.9 1.4* 1.3* 2.1  PHOS  --   --   --  2.9 2.7    Liver Function Tests: Recent Labs  Lab 01/02/25 1256 01/03/25 0527 01/06/25 1035 01/07/25 0502  AST 42* 38 23 21  ALT 31 34 23 21  ALKPHOS 68 70 75 85  BILITOT 1.3* 0.8 1.3* 1.5*  PROT 5.6* 6.1* 5.8* 5.7*  ALBUMIN  3.7 3.7 3.6 3.5   Recent Labs  Lab 01/02/25 1256  LIPASE 18    No results for input(s): AMMONIA in the last 168 hours.  CBC: Recent Labs  Lab 01/02/25 1256 01/02/25 1306 01/03/25 0527 01/05/25 0344 01/06/25 1035 01/07/25 0502  WBC 3.3*  --  4.1 5.2 4.5 4.2  NEUTROABS 2.6  --   --   --  3.6 3.4  HGB 12.7 13.6 13.1 12.7 12.3 12.0  HCT 37.6 40.0 40.3 39.9 37.7 36.3  MCV 97.4  --  100.0 101.3* 98.4 98.1  PLT 199  --  214 216 209 222    Cardiac Enzymes: No results for input(s): CKTOTAL, CKMB, CKMBINDEX, TROPONINI in the last 168 hours.  BNP: BNP (last 3 results) No results for input(s): BNP in the last 8760 hours.  ProBNP (last 3 results) Recent Labs    01/02/25 1256  PROBNP 9,034.0*     CBG: No results for input(s): GLUCAP in the last 168 hours.  Coagulation Studies: No results for input(s): LABPROT, INR in the last 72 hours.   Imaging   CARDIAC CATHETERIZATION Result Date: 01/07/2025 1. Elevated right and left heart filling pressures. 2. Severe mixed pulmonary arterial/pulmonary venous hypertension. 3. Low CI by thermo, higher by Fick. 4. Simultaneous LV/RV pressure tracings were not suggestive of ventricular interdependence (LV and RV pressures track together).  Equalization of diastolic pressures was not present.  This was not suggestive of effusive/constrictive pericarditis.    Medications:   Current Medications:  budesonide  (PULMICORT ) nebulizer solution  0.25 mg Nebulization BID   carvedilol   3.125 mg Oral BID WC   Chlorhexidine  Gluconate Cloth  6 each Topical Daily   colchicine   0.6 mg Oral Daily   [START ON 01/08/2025] enoxaparin  (LOVENOX ) injection  40 mg Subcutaneous Q24H   furosemide   80 mg Intravenous BID   influenza vac split trivalent PF  0.5 mL Intramuscular Tomorrow-1000   potassium chloride   40 mEq Oral BID   sodium chloride  flush  10-40 mL Intracatheter Q12H   sodium chloride  flush  3 mL Intravenous Q12H    Infusions:  sodium chloride       Assessment/Plan   1. Pericardial  disease: Patient has had a moderate pericardial effusion since at least 7/24.  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  She has a history of Hodgkins lymphoma and radiation to the right chest due to SVC syndrome.  RHC/hemodynamic LHC today showed elevated right and left-sided filling pressures but did not show equalization of diastolic pressures. Simultaneous LV and RV pressures tracings were not suggestive of ventricular interdependence (LV and RV pressures appeared to track together rather than oppositely).  This did not give us  evidence for constrictive physiology.  - I will work on diuresing her, then will get cardiac MRI with free breathing sequences for further evaluation of constrictive physiology.  2. CHF: Echo this admission showed EF 65-70%, D-shaped septum, mild RV enlargement with moderate RV dysfunction, PASP 64 mmHg, moderate pericardial effusion without tamponade, pericardial thickening, IVC normal in size but does not collapse. As above, RHC did not confirm pericardial constriction-type physiology.  There appeared to be elevated PCWP consistent with LV diastolic dysfunction, severe mixed pulmonary arterial/pulmonary venous hypertension, and RV failure with elevated RA pressure. She is volume overloaded on exam and requiring oxygen.  - Patient will need diuresis, start Lasix  80 mg IV bid and replace K.  - Add Farxiga  10 mg daily.  - As above, will need cardiac MRI when diuresed.  3. Pulmonary hypertension: Cath today showed severe mixed pulmonary venous/pulmonary arterial hypertension.  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 ANA, RF, HIV negative. CTA chest with no PE.  - Needs diuresis as above.  - Complete autoimmune workup with anti-SCL-70, anti-centromere Abs.   - Need to complete workup for constrictive physiology with cardiac MRI.  - Ideally would get repeat RHC after diuresis though not  sure she will agree to this.  4. SVC syndrome: She had palliative radiation to right lung mass in the past.  Of note, unable to do RHC from right brachial area due to obstruction of right subclavian, unable to pass Swan into SVC.  5. Patient is threatening to leave AMA.  She is volume overloaded on 4L oxygen.  I told her this would be a bad idea.   Length of Stay: 4  Ezra Shuck, MD  01/07/2025, 11:54 AM  Advanced Heart Failure Team Pager [phone] (M-F; 7a - 5p)   Please visit [email or web address]: For overnight coverage please call cardiology fellow first. If fellow not available call Shock/ECMO MD on call.  For ECMO / Mechanical Support (Impella, IABP, LVAD) issues call Shock / ECMO MD on call.      [1]  Allergies Allergen Reactions   Doxorubicin  Hcl Liposomal Anaphylaxis, Shortness Of Breath and Other (See Comments)    Pt had severe hypersensitivity reaction with chest pain and SOB immediately following start of medication. See hypersensitivity note from 07/14/2023.   "

## 2025-01-07 NOTE — Telephone Encounter (Signed)
 Pharmacy Patient Advocate Encounter  Received notification from [employer] that Prior Authorization for Farxiga  10MG  tablets has been APPROVED from 01/07/25 to 01/07/26. Ran test claim, Copay is $4. This test claim was processed through [facility]- copay amounts may vary at other pharmacies due to pharmacy/plan contracts, or as the patient moves through the different stages of their insurance plan.   PA #/Case ID/Reference #: [id number]

## 2025-01-08 ENCOUNTER — Inpatient Hospital Stay (HOSPITAL_COMMUNITY)

## 2025-01-08 ENCOUNTER — Other Ambulatory Visit (HOSPITAL_COMMUNITY): Payer: Self-pay

## 2025-01-08 DIAGNOSIS — R7989 Other specified abnormal findings of blood chemistry: Secondary | ICD-10-CM | POA: Diagnosis not present

## 2025-01-08 DIAGNOSIS — J9601 Acute respiratory failure with hypoxia: Secondary | ICD-10-CM | POA: Diagnosis not present

## 2025-01-08 DIAGNOSIS — R160 Hepatomegaly, not elsewhere classified: Secondary | ICD-10-CM | POA: Diagnosis not present

## 2025-01-08 DIAGNOSIS — C8190 Hodgkin lymphoma, unspecified, unspecified site: Secondary | ICD-10-CM | POA: Diagnosis not present

## 2025-01-08 DIAGNOSIS — I3139 Other pericardial effusion (noninflammatory): Secondary | ICD-10-CM

## 2025-01-08 LAB — CBC WITH DIFFERENTIAL/PLATELET
Abs Immature Granulocytes: 0.01 K/uL (ref 0.00–0.07)
Basophils Absolute: 0 K/uL (ref 0.0–0.1)
Basophils Relative: 0 %
Eosinophils Absolute: 0.1 K/uL (ref 0.0–0.5)
Eosinophils Relative: 1 %
HCT: 35.3 % — ABNORMAL LOW (ref 36.0–46.0)
Hemoglobin: 11.8 g/dL — ABNORMAL LOW (ref 12.0–15.0)
Immature Granulocytes: 0 %
Lymphocytes Relative: 4 %
Lymphs Abs: 0.2 K/uL — ABNORMAL LOW (ref 0.7–4.0)
MCH: 33.1 pg (ref 26.0–34.0)
MCHC: 33.4 g/dL (ref 30.0–36.0)
MCV: 98.9 fL (ref 80.0–100.0)
Monocytes Absolute: 0.7 K/uL (ref 0.1–1.0)
Monocytes Relative: 13 %
Neutro Abs: 4 K/uL (ref 1.7–7.7)
Neutrophils Relative %: 82 %
Platelets: 234 K/uL (ref 150–400)
RBC: 3.57 MIL/uL — ABNORMAL LOW (ref 3.87–5.11)
RDW: 12.7 % (ref 11.5–15.5)
WBC: 5 K/uL (ref 4.0–10.5)
nRBC: 0 % (ref 0.0–0.2)

## 2025-01-08 LAB — COMPREHENSIVE METABOLIC PANEL WITH GFR
ALT: 16 U/L (ref 0–44)
AST: 23 U/L (ref 15–41)
Albumin: 3.3 g/dL — ABNORMAL LOW (ref 3.5–5.0)
Alkaline Phosphatase: 82 U/L (ref 38–126)
Anion gap: 7 (ref 5–15)
BUN: 10 mg/dL (ref 6–20)
CO2: 40 mmol/L — ABNORMAL HIGH (ref 22–32)
Calcium: 8.7 mg/dL — ABNORMAL LOW (ref 8.9–10.3)
Chloride: 90 mmol/L — ABNORMAL LOW (ref 98–111)
Creatinine, Ser: 0.92 mg/dL (ref 0.44–1.00)
GFR, Estimated: >60 mL/min
Glucose, Bld: 82 mg/dL (ref 70–99)
Potassium: 4 mmol/L (ref 3.5–5.1)
Sodium: 137 mmol/L (ref 135–145)
Total Bilirubin: 1.1 mg/dL (ref 0.0–1.2)
Total Protein: 5.5 g/dL — ABNORMAL LOW (ref 6.5–8.1)

## 2025-01-08 LAB — MAGNESIUM: Magnesium: 1.6 mg/dL — ABNORMAL LOW (ref 1.7–2.4)

## 2025-01-08 LAB — PRO BRAIN NATRIURETIC PEPTIDE: Pro Brain Natriuretic Peptide: 4158 pg/mL — ABNORMAL HIGH

## 2025-01-08 LAB — PHOSPHORUS: Phosphorus: 2.5 mg/dL (ref 2.5–4.6)

## 2025-01-08 MED ORDER — GADOBUTROL 1 MMOL/ML IV SOLN
7.0000 mL | Freq: Once | INTRAVENOUS | Status: AC | PRN
  Administered 2025-01-08: 7 mL via INTRAVENOUS

## 2025-01-08 MED ORDER — POTASSIUM CHLORIDE CRYS ER 20 MEQ PO TBCR
40.0000 meq | EXTENDED_RELEASE_TABLET | Freq: Once | ORAL | Status: AC
  Administered 2025-01-08: 40 meq via ORAL
  Filled 2025-01-08: qty 2

## 2025-01-08 MED ORDER — HEPARIN SOD (PORK) LOCK FLUSH 100 UNIT/ML IV SOLN
500.0000 [IU] | INTRAVENOUS | Status: AC | PRN
  Administered 2025-01-08: 500 [IU]

## 2025-01-08 MED ORDER — SPIRONOLACTONE 12.5 MG HALF TABLET
12.5000 mg | ORAL_TABLET | Freq: Every day | ORAL | Status: DC
  Administered 2025-01-08: 12.5 mg via ORAL
  Filled 2025-01-08: qty 1

## 2025-01-08 MED ORDER — FUROSEMIDE 40 MG PO TABS
40.0000 mg | ORAL_TABLET | Freq: Every day | ORAL | 11 refills | Status: AC
  Filled 2025-01-08: qty 30, 30d supply, fill #0

## 2025-01-08 MED ORDER — MAGNESIUM SULFATE 2 GM/50ML IV SOLN: 2.0000 g | Freq: Once | INTRAVENOUS | Status: DC

## 2025-01-08 MED ORDER — MAGNESIUM SULFATE 4 GM/100ML IV SOLN
4.0000 g | Freq: Once | INTRAVENOUS | Status: AC
  Administered 2025-01-08: 4 g via INTRAVENOUS
  Filled 2025-01-08: qty 100

## 2025-01-08 MED ORDER — FUROSEMIDE 10 MG/ML IJ SOLN
80.0000 mg | Freq: Once | INTRAMUSCULAR | Status: AC
  Administered 2025-01-08: 80 mg via INTRAVENOUS
  Filled 2025-01-08: qty 8

## 2025-01-08 NOTE — Progress Notes (Signed)
 Patient leaving AMA. The risks of leaving AMA explained to the patient to include incomplete treatment, decompensation, deteriorating condition and the possibility of death.  Patient voices understanding.

## 2025-01-08 NOTE — Progress Notes (Addendum)
 "    Advanced Heart Failure Rounding Note  Cardiologist: None  AHF Cardiologist: Dr. Rolan Chief Complaint: Pulmonary HTN, ?Pericardial effusion Patient Profile   Heidi [name] is a 39 y.o. female with history of Hodgkin's lymphoma, SVC syndrome s/p chest wall radiation, smoking, and CHF.   Subjective:    Weight down 10lbs. SCr stable. Diuresed 5L yesterday with 80 IV BID.   Feels better today. Still on 4L [place], weaned to 3L while in the room. Sats staying higher than 95%.   Wants to go home today.   Objective:   Weight Range: 60.1 kg Body mass index is 20.74 kg/m.   Vital Signs:   Temp:  [97.6 F (36.4 C)-98.5 F (36.9 C)] 97.6 F (36.4 C) (01/13 0721) Pulse Rate:  [89-158] 89 (01/13 0725) Resp:  [16-32] 17 (01/13 0314) BP: (98-139)/(69-98) 114/71 (01/13 0314) SpO2:  [85 %-97 %] 96 % (01/13 0314) Weight:  [60.1 kg] 60.1 kg (01/13 0314) Last BM Date : 01/02/25  Weight change: Filed Weights   01/06/25 0121 01/07/25 0500 01/08/25 0314  Weight: 65.1 kg 64.8 kg 60.1 kg    Intake/Output:   Intake/Output Summary (Last 24 hours) at 01/08/2025 0738 Last data filed at 01/08/2025 0300 Gross per 24 hour  Intake 850 ml  Output 5800 ml  Net -4950 ml     Physical Exam   General:  well appearing, +4L [place]: Regular rate & rhythm. No murmurs. JVD flat.  Lungs: clear Extremities: no edema   Telemetry   NSR 80s-90s, intt PVCs (Personally reviewed)    Labs   CBC Recent Labs    01/07/25 0502 01/07/25 0830 01/07/25 0831 01/08/25 0604  WBC 4.2  --   --  5.0  NEUTROABS 3.4  --   --  4.0  HGB 12.0   < > 12.9 11.8*  HCT 36.3   < > 38.0 35.3*  MCV 98.1  --   --  98.9  PLT 222  --   --  234   < > = values in this interval not displayed.   Basic Metabolic Panel Recent Labs    98/87/73 0502 01/07/25 0830 01/07/25 0831 01/08/25 0604  NA 136   < > 133* 137  K 3.7   < > 3.6 4.0  CL 91*  --   --  90*  CO2 39*  --   --  40*  GLUCOSE 90  --   --  82  BUN 8   --   --  10  CREATININE 0.75  --   --  0.92  CALCIUM  7.9*  --   --  8.7*  MG 2.1  --   --  1.6*  PHOS 2.7  --   --  2.5   < > = values in this interval not displayed.   Liver Function Tests Recent Labs    01/07/25 0502 01/08/25 0604  AST 21 23  ALT 21 16  ALKPHOS 85 82  BILITOT 1.5* 1.1  PROT 5.7* 5.5*  ALBUMIN  3.5 3.3*   No results for input(s): LIPASE, AMYLASE in the last 72 hours. Cardiac Enzymes No results for input(s): CKTOTAL, CKMB, CKMBINDEX, TROPONINI in the last 72 hours.  BNP: BNP (last 3 results) No results for input(s): BNP in the last 8760 hours.  ProBNP (last 3 results) Recent Labs    01/02/25 1256 01/08/25 0604  PROBNP 9,034.0* 4,158.0*     D-Dimer No results for input(s): DDIMER in the last 72 hours.  Hemoglobin A1C No results for input(s): HGBA1C in the last 72 hours. Fasting Lipid Panel No results for input(s): CHOL, HDL, LDLCALC, TRIG, CHOLHDL, LDLDIRECT in the last 72 hours. Medications:   Scheduled Medications:  budesonide  (PULMICORT ) nebulizer solution  0.25 mg Nebulization BID   carvedilol   3.125 mg Oral BID WC   Chlorhexidine  Gluconate Cloth  6 each Topical Daily   colchicine   0.6 mg Oral Daily   dapagliflozin  propanediol  10 mg Oral Daily   enoxaparin  (LOVENOX ) injection  40 mg Subcutaneous Q24H   furosemide   80 mg Intravenous BID   influenza vac split trivalent PF  0.5 mL Intramuscular Tomorrow-1000   potassium chloride   40 mEq Oral BID   sodium chloride  flush  10-40 mL Intracatheter Q12H   sodium chloride  flush  3 mL Intravenous Q12H    Infusions:  sodium chloride       PRN Medications: sodium chloride , acetaminophen  **OR** acetaminophen , acetaminophen , albuterol , LORazepam , ondansetron  **OR** ondansetron  (ZOFRAN ) IV, ondansetron  (ZOFRAN ) IV, mouth rinse, oxyCODONE , senna, sodium chloride  flush, sodium chloride  flush  Assessment/Plan   1. Pericardial disease: Patient has had a moderate pericardial  effusion since at least 7/24.  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  She has a history of Hodgkins lymphoma and radiation to the right chest due to SVC syndrome.  RHC/hemodynamic LHC 01/07/25 showed elevated R/L-sided filling pressures but did not show equalization of diastolic pressures. Simultaneous LV and RV pressures tracings were not suggestive of ventricular interdependence (LV and RV pressures appeared to track together rather than oppositely).  This did not give us  evidence for constrictive physiology.  - Plan for cardiac MRI today with free breathing sequences for further evaluation of constrictive physiology.  2. CHF: Echo this admission showed EF 65-70%, D-shaped septum, mild RV enlargement with moderate RV dysfunction, PASP 64 mmHg, moderate pericardial effusion without tamponade, pericardial thickening, IVC normal in size but does not collapse. As above, RHC did not confirm pericardial constriction-type physiology.  There appeared to be elevated PCWP consistent with LV diastolic dysfunction, severe mixed pulmonary arterial/pulmonary venous hypertension, and RV failure with elevated RA pressure.  - Euvolemic on exam. Hold further diuresis. -5.8L UOP.  - Continue Farxiga  10 mg daily.  - Start spiro 12.5 mg daily - cardiac MRI today.  3. Pulmonary hypertension: Cath today showed severe mixed pulmonary venous/pulmonary arterial hypertension.  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 ANA, RF, HIV negative. CTA chest with no PE.  - Needs diuresis as above.  - Complete autoimmune workup with anti-SCL-70, anti-centromere Abs.   - Need to complete workup for constrictive physiology with cardiac MRI. Ordered - Ideally would get repeat RHC after diuresis though not sure she will agree to this.  4. SVC syndrome: She had palliative radiation to right lung mass in the past.  Of note, unable to do RHC from  right brachial area due to obstruction of right subclavian, unable to pass Swan into SVC.    Medication concerns reviewed with patient and pharmacy team. Barriers identified: has threatened to leave AMA this admission.   Work on weaning oxygen now that euvolemic. Ambulate. Will arrange close f/u in AHF clinic.   Length of Stay: 5  Beckey LITTIE Coe, NP  01/08/2025, 7:38 AM  Advanced Heart Failure Team Pager [phone] (M-F; 7a - 5p)   Please visit [email or web address]: For overnight coverage please call cardiology fellow first. If fellow not available call Shock/ECMO MD  on call.  For ECMO / Mechanical Support (Impella, IABP, LVAD) issues call Shock / ECMO MD on call.    Patient seen with NP, I formulated the plan and agree with the above note.   Excellent diuresis yesterday, I/Os net negative 4950.  Weight down 10 lbs.  Breathing better, down to 3L [place] this morning.    Pro-BNP 4158 today.  She is still eager to go home.   General: NAD Neck: No JVD, no thyromegaly or thyroid  nodule.  Lungs: Clear to auscultation bilaterally with normal respiratory effort. CV: Nondisplaced PMI.  Heart regular S1/S2, no S3/S4, no murmur.  No peripheral edema.   Abdomen: Soft, nontender, no hepatosplenomegaly, no distention.  Skin: Intact without lesions or rashes.  Neurologic: Alert and oriented x 3.  Psych: Normal affect. Extremities: No clubbing or cyanosis.  HEENT: Normal.   Excellent diuresis.  Volume status difficult given history of SVC syndrome (neck veins probably not helpful).  She is still on some oxygen with pro-BNP coming down but elevated.  No peripheral edema.  - Would like to give 1 more dose of Lasix  80 mg IV.  Transition to Lasix  40 mg po daily for home tomorrow.  - Add spironolactone  12.5 daily.  - Continue Farxiga  10 mg daily.   She has a moderate pericardial effusion and there has been question of effusive/constrictive pericarditis though this was NOT confirmed by hemodynamic RHC/LHC.  She had  severe mixed pulmonary arterial/pulmonary venous hypertension.  It is possible that she has chronic pulmonary hypertension with a resulting pericardial effusion.  - She will get a cardiac MRI today to assess for evidence of constrictive pericarditis.   - I would like to repeat RHC tomorrow after diuresis to reassess PCWP and PA pressure but I am not sure that she will be agreeable to staying. If not, will need to repeat as outpatient.   Ezra Shuck 01/08/2025 8:25 AM  "

## 2025-01-08 NOTE — Plan of Care (Signed)
" °  Problem: Clinical Measurements: Goal: Diagnostic test results will improve Outcome: Progressing Goal: Respiratory complications will improve Outcome: Progressing   Problem: Activity: Goal: Risk for activity intolerance will decrease Outcome: Progressing   Problem: Activity: Goal: Capacity to carry out activities will improve Outcome: Progressing   "

## 2025-01-08 NOTE — TOC Progression Note (Signed)
 Transition of Care [facility]) - Progression Note    Patient Details  Name: Heidi [name] MRN: [id number] Date of Birth: [birth year]  Transition [facility]) CM/SW Contact  Arlana JINNY Nicholaus [place] Phone Number: [phone] 01/08/2025, 3:18 PM  Clinical Narrative:   2:06 PM- HF CSW attempted to meet with patient at bedside. Patient was sleeping. CSW will follow up at a more appropriate time.   HF CSW/CM will continue to follow and monitor for dc readiness.     Expected Discharge Plan: Home/Self Care Barriers to Discharge: Continued Medical Work up               Expected Discharge Plan and Services   Discharge Planning Services: CM Consult   Living arrangements for the past 2 months: Apartment                                       Social Drivers of Health (SDOH) Interventions SDOH Screenings   Food Insecurity: No Food Insecurity (01/03/2025)  Housing: Unknown (01/03/2025)  Transportation Needs: No Transportation Needs (01/03/2025)  Utilities: Not At Risk (01/03/2025)  Tobacco Use: High Risk (01/02/2025)    Readmission Risk Interventions    09/09/2023   12:37 PM  Readmission Risk Prevention Plan  Transportation Screening Complete  PCP or Specialist Appt within 3-5 Days Complete  HRI or Home Care Consult Complete  Social Work Consult for Recovery Care Planning/Counseling Complete  Palliative Care Screening Not Applicable  Medication Review Oceanographer) Complete

## 2025-01-08 NOTE — Progress Notes (Signed)
 Patient wanting to leave AMA. Discussed with her plans for RHC tomorrow, not willing to stay. Ok to complete as outpatient.   cMRI not yet read.   Please send meds to [facility] pharmacy.  AHF meds at discharge:  Coreg  3.125 mg BID Colchicine  0.6 mg daily  Lasix  40 mg daily Farxiga  10 mg daily Spiro 12.5 mg daily  Has f/u arranged in AHF clinic. Discussed taking her meds and coming to f/u appt, acknowledged understanding.   Beckey LITTIE Coe AGACNP-BC  Advanced Heart Failure Team  01/08/2025

## 2025-01-08 NOTE — Discharge Summary (Signed)
 " Physician Discharge Summary   Patient: Heidi [name] MRN: [id number] DOB: [birth year]  Admit date:     01/02/2025  Discharge date: 01/08/2025   Discharge Physician: Alejandro Marker, DO   PCP: Pcp, No   Recommendations at discharge:   Follow up With PCP within 1 to 2 weeks repeat CBC, CMP, mag, Phos within 1 week Follow-up with Cardiology advanced heart failure team in the outpatient setting  Discharge Diagnoses: Principal Problem:   Acute respiratory failure with hypoxia (HCC) Active Problems:   Volume overload   Hodgkin's disease in adult [facility])   Pericardial effusion   Pleural effusion   Leukopenia   Hypocalcemia   Liver mass   Hyperbilirubinemia   Abnormal LFTs (liver function tests)   Metastasis to bone (HCC)   Ascites, malignant (HCC)   Asthma exacerbation  Resolved Problems:   * No resolved hospital problems. [facility] Course: The patient is a 39 year old female with PMH of asthma, Hodgkin's lymphoma, noncompliance, pericardial effusion presented with worsening abdominal distention, shortness of breath. CT abdomen/pelvis with contrast showed diffusely heterogeneous appearance of the hepatic parenchyma, concerning for metastatic disease and MRI was recommended.  MRI showed no focal liver lesion.  She was started on IV Lasix .  TTE showed EF of 65 to 70% with moderately reduced RV function with severe pulmonary hypertension and moderate pericardial effusion without tamponade.  Cardiology was consulted and she is being diuresed and now plan is for [facility] on 01/07/25.  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  Cardiology also feels that she has significant pulmonary hypertension and is mixed pulmonary venous and arterial and they are recommending diuresis and working her up fully for constrictive physiology with a  cardiac MRI after she is diuresed.  Cardiac MRI was done today given that she diuresed 5.8 L yesterday.  Cardiology wanted to give her some more diuresis today and repeat a right heart cath in the morning but patient did not want to stay and left AGAINST MEDICAL ADVICE  Assessment and Plan:  Acute Respiratory Failure with Hypoxia: Most likely from volume overload with elevated BNP, ascites, and anasarca and see below. CTA chest was negative for PE. TTE showed elevated RV filling pressures and severe pulmonary HTN. C/w Albuterol  2.5 mg Neb q4hprn Wheezing and SOB, Budesonide  0.25 mg Neb BID; Yesterday per RN, her O2 sats dropped to 70% on room air and she was placed back on 3L O2. Repeat CXR in the AM and will need an Ambulatory Home O2 Screen prior to D/C her remained volume overloaded and cardiology has now started her on IV Lasix  80 mg twice daily and they gave her a dose of IV Lasix  80 mg today.  She will need an amatory home O2 screen prior to discharge however she signed out AGAINST MEDICAL ADVICE   Pericardial Effusion and Disease: Moderate on TTE, no tamponade physiology. Previously also noted to have moderate pericardial effusion. 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 Cardiology Consulted and she is going to get R/L Heart Cath w/ Simultaneous RV/LV Tracings and s lower suggestive of ventricular interdependence and did not give evidence for constrictive physiology -Cardiology now recommending diuresing her and then getting cardiac MRI when free breathing sequences for further evaluation of constrictive physiology; cardiac MRI ordered was done and ordered today but she left after AGAINST MEDICAL ADVICE -Adding low-dose Carvedilol  3.125  mg po BID for her tachycardia.  -ANA Negative, RA Latix Turbid was <10.0 and ESR was 3  Acute Diastolic CHF and Right Ventricular Failure: Cardiology performed And she has left ventricular diastolic dysfunction as well as severe mixed  pulmonary artery and pulmonary venous hypertension as well as right ventricular failure with elevated RA pressure - Cardiology now starting diuresis and initiating Lasix  IV 80 mg twice daily and replacing potassium and starting Farxiga ; she got a dose of IV Lasix  80 mg today but then left AMA - BMP is now 4,158.0  Severe Pulmonary Hypertension:TTE findings as above, had elevated PASP of 64.2 and flattened interventricular septum.  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  She was on p.o. furosemide  40 mg daily -Right and left heart cath done and showed Elevated right and left heart filling pressures. Severe mixed pulmonary arterial/pulmonary venous hypertension. Low CI by thermo, higher by Fick. Simultaneous LV/RV pressure tracings were not suggestive of ventricular interdependence (LV and RV pressures track together).  Equalization of diastolic pressures was not present.  This was not suggestive of effusive/constrictive pericarditis. -Cardiology recommending diuresis as above and completing autoimmune workup and they are going to check anti-SCL 70, anticentromere antibodies and completing restrictive physiology workup with cardiac MRI and this was done and ordered today -Cardiology wants to repeat right heart cath after diuresis this was to be done 01/09/2025 but she signed out against him a   Hypomagnesemia: Mag Level is now 1.6.  Replete with IV mag sulfate 2 g.  CTM and Replete as Necessary. Repeat Mag Level in the AM.  She left AMA   Hodgkin's Lymphoma w/ Extensive Lymphadenopathy and SVC Syndrome, Liver Disease: CT abd showed diffusely heterogeneous appearance of the hepatic parenchyma, which could be due to recent chemotherapy or hepatic congestion, but there is also concern for metastatic disease. MRI completed and no focal liver lesion seen (the appearance on  the CT was likely due to phase of contrast) -Will need f/u with Dr. 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 Will need f/u with oncology    Leukopenia: Resolved. WBC Trend:  Recent Labs  Lab 12/11/24 1911 01/02/25 1256 01/03/25 0527 01/05/25 0344 01/06/25 1035 01/07/25 0502 01/08/25 0604  WBC 3.8* 3.3* 4.1 5.2 4.5 4.2 5.0  -CTM and Trend and Repeat CMP in the AM   ? HTN: Per patient no prior diagnosis and lisinopril  was discontinued, she is normotensive. CTM BP per Protocol. Last BP Reading was 108/78  Hyperbilirubinemia: See Above. Likely from Hodgkin's Lymphoma and Liver Disease. T Bili is now 1.3 -> 1.5 and is now 1.1. CTM & Trend & Repeat CMP in the AM   Normocytic Anemia: Hgb/Hct Trend:  Recent Labs  Lab 01/03/25 0527 01/05/25 0344 01/06/25 1035 01/07/25 0502 01/07/25 0830 01/07/25 0831 01/08/25 0604  HGB 13.1 12.7 12.3 12.0 12.9 12.9 11.8*  HCT 40.3 39.9 37.7 36.3 38.0 38.0 35.3*  MCV 100.0 101.3* 98.4 98.1  --   --  98.9  -Check anemia panel in the a.m. however she is signed out AGAINST MEDICAL ADVICE prior to repeat labs in the a.m.  Hypoalbuminemia: Patient's Albumin  Lvl Trend: Recent  Labs  Lab 01/02/25 1256 01/03/25 0527 01/06/25 1035 01/07/25 0502 01/08/25 0604  ALBUMIN  3.7 3.7 3.6 3.5 3.3*  -Continue to Monitor and Trend and repeat CMP in the AM  Consultants: As delineated as above  Procedures performed: As delineated as above  Disposition: Left AGAINST MEDICAL ADVICE  Diet recommendation:  Cardiac diet  DISCHARGE MEDICATION: Allergies as of 01/08/2025       Reactions   Doxorubicin  Hcl Liposomal Anaphylaxis, Shortness Of Breath, Other (See Comments)   Pt had severe hypersensitivity reaction with chest pain and SOB immediately following start  of medication. See hypersensitivity note from 07/14/2023.        Medication List     TAKE these medications    albuterol  108 (90 Base) MCG/ACT inhaler Commonly known as: VENTOLIN  HFA Inhale 1-2 puffs into the lungs every 6 (six) hours as needed for wheezing or shortness of breath.   budesonide -formoterol  80-4.5 MCG/ACT inhaler Commonly known as: Symbicort  Inhale 2 puffs into the lungs 2 (two) times daily as needed. What changed: reasons to take this   furosemide  40 MG tablet Commonly known as: Lasix  Take 1 tablet (40 mg total) by mouth daily.   predniSONE  10 MG (21) Tbpk tablet Commonly known as: STERAPRED UNI-PAK 21 TAB Take 40 mg (4 tablets) days 1 and 2, take 30 mg (3 tablets) days 3 and 4, take 20 mg (2 tablets) days 5 and 6, take 10 mg (1 tablet) days 7 and 8, take 5 mg (0.5 mg) days 9 and 10, then stop.       Discharge Exam: Filed Weights   01/06/25 0121 01/07/25 0500 01/08/25 0314  Weight: 65.1 kg 64.8 kg 60.1 kg   Vitals:   01/08/25 0725 01/08/25 1150  BP:  115/86  Pulse: 89 (!) 108  Resp:  20  Temp:  97.8 F (36.6 C)  SpO2:  95%   Examination: Physical Exam:  Constitutional: Ill-appearing African-American female who just came back from cardiac MRI Respiratory: Diminished to auscultation bilaterally coarse breath sounds with some crackles and mild rhonchi mild. Normal respiratory effort and patient is not tachypenic. No accessory muscle use.  Wearing supplemental oxygen nasal cannula Cardiovascular: RRR, no murmurs / rubs / gallops. S1 and S2 auscultated.  Extremity edema Abdomen: Soft, non-tender, nondistended. Bowel sounds positive.  GU: Deferred. Musculoskeletal: No clubbing / cyanosis of digits/nails. No joint deformity upper and lower extremities.  Skin: No rashes, lesions, ulcers on limited skin evaluation. No induration; Warm and dry.  Neurologic: CN 2-12 grossly intact with no focal deficits. Romberg sign and cerebellar reflexes not assessed.   Psychiatric: Normal judgment and insight. Alert and oriented x 3. Normal mood and appropriate affect.   Condition at discharge: GUARDED  The results of significant diagnostics from this hospitalization (including imaging, microbiology, ancillary and laboratory) are listed below for reference.   Imaging Studies: MR CARDIAC VELOCITY FLOW MAP Result Date: 01/08/2025 CLINICAL DATA:  Pericardial effusion,?Effusive/constrictive pericarditis EXAM: CARDIAC MRI TECHNIQUE: The patient was scanned on a 1.5 Tesla GE magnet. A dedicated cardiac coil was used. Functional imaging was done using Fiesta sequences. 2,3, and 4 chamber views were done to assess for RWMA's. Modified Simpson's rule using a short axis stack was used to calculate an ejection fraction on a dedicated work [employer]. The patient received 8 cc of Gadavist . After 10 minutes inversion recovery sequences were used to assess for infiltration and scar tissue. FINDINGS: Technically difficult study. Patient had trouble holding her breath and had to  get out of scanner to use the bathroom. Limited images of the lung fields showed extensive consolidation/airspace disease/scarring right upper lobe and less prominently in the left upper lobe. Small to moderate right pleural effusion. Moderate circumferential pericardial effusion. No right ventricular diastolic indentation, no significant right atrial indentation. Normal left ventricular size and wall thickness. D-shaped interventricular septum in systole and diastole suggestive of RV pressure/volume overload. Diffuse hypokinesis with LV EF 46%. With free-breathing, the septum remains D-shaped but there does not appear to be respirophasic variation in its position. Severe right ventricular dilation wtih RV EF 20%. Normal left atrial size. Mild right atrial enlargement. Trileaflet aortic valve with no significant stenosis or regurgitation. On delayed enhancement imaging, there is mid-wall late  gadolinium enhancement (LGE) at the basal inferior RV insertion site. The pericardium does not enhance. MEASUREMENTS: MEASUREMENTS Free breathing sequence had to be used for volumetric measurements. LVEDV 113 mL LVEDVi 67 mL/m2 LVSV 52 mL LVEF 46% RVEDV 282 mL RVEDVi 167 mL/m2 RVSV 57 mL RVEF 20% The aortic flow sequence does not look accurate, was not quantified. T1 quantification did not appear accurate. T2 quantification did not appear accurate. IMPRESSION: 1. Technically difficult study. Unable to use flow sequences or T1 and T2 sequences for quantification. Free breathing sequences had to be used for volumetric calculations. 2.  Scarring/consolidation noted in the right upper lobe. 3.  Normal LV size with LV EF 46%, mild diffuse hypokinesis. 4. D-shaped interventricular septum suggestive of RV pressure/volume overload. Severely dilated RV with RV EF 20%. 5. Moderate circumferential pericardial effusion without evidence for pericardial tamponade. 6. Mid-wall LGE at the basal inferior RV insertion site. This is a nonspecific finding suggestive of pressure/volume overload. No definite pericardial enhancement noted. 7. Free breathing sequences did not appear to show respirophasic variation of the interventricular septal position. There is a moderate pericardial effusion without definitive evidence for tamponade or effusive/constrictive physiology. There is a severely dilated RV with severe RV dysfunction, possible pulmonary hypertension with RV dysfunction and secondary pericardial effusion as the main issue here. Heidi [name] Electronically Signed   By: Ezra Shuck M.D.   On: 01/08/2025 16:41   MR CARDIAC VELOCITY FLOW MAP Result Date: 01/08/2025 CLINICAL DATA:  Pericardial effusion,?Effusive/constrictive pericarditis EXAM: CARDIAC MRI TECHNIQUE: The patient was scanned on a 1.5 Tesla GE magnet. A dedicated cardiac coil was used. Functional imaging was done using Fiesta sequences. 2,3, and 4 chamber views  were done to assess for RWMA's. Modified Simpson's rule using a short axis stack was used to calculate an ejection fraction on a dedicated work [employer]. The patient received 8 cc of Gadavist . After 10 minutes inversion recovery sequences were used to assess for infiltration and scar tissue. FINDINGS: Technically difficult study. Patient had trouble holding her breath and had to get out of scanner to use the bathroom. Limited images of the lung fields showed extensive consolidation/airspace disease/scarring right upper lobe and less prominently in the left upper lobe. Small to moderate right pleural effusion. Moderate circumferential pericardial effusion. No right ventricular diastolic indentation, no significant right atrial indentation. Normal left ventricular size and wall thickness. D-shaped interventricular septum in systole and diastole suggestive of RV pressure/volume overload. Diffuse hypokinesis with LV EF 46%. With free-breathing, the septum remains D-shaped but there does not appear to be respirophasic variation in its position. Severe right ventricular dilation wtih RV EF 20%. Normal left atrial size. Mild right atrial enlargement. Trileaflet aortic valve with no significant stenosis or regurgitation. On delayed enhancement  imaging, there is mid-wall late gadolinium enhancement (LGE) at the basal inferior RV insertion site. The pericardium does not enhance. MEASUREMENTS: MEASUREMENTS Free breathing sequence had to be used for volumetric measurements. LVEDV 113 mL LVEDVi 67 mL/m2 LVSV 52 mL LVEF 46% RVEDV 282 mL RVEDVi 167 mL/m2 RVSV 57 mL RVEF 20% The aortic flow sequence does not look accurate, was not quantified. T1 quantification did not appear accurate. T2 quantification did not appear accurate. IMPRESSION: 1. Technically difficult study. Unable to use flow sequences or T1 and T2 sequences for quantification. Free breathing sequences had to be used for volumetric calculations. 2.   Scarring/consolidation noted in the right upper lobe. 3.  Normal LV size with LV EF 46%, mild diffuse hypokinesis. 4. D-shaped interventricular septum suggestive of RV pressure/volume overload. Severely dilated RV with RV EF 20%. 5. Moderate circumferential pericardial effusion without evidence for pericardial tamponade. 6. Mid-wall LGE at the basal inferior RV insertion site. This is a nonspecific finding suggestive of pressure/volume overload. No definite pericardial enhancement noted. 7. Free breathing sequences did not appear to show respirophasic variation of the interventricular septal position. There is a moderate pericardial effusion without definitive evidence for tamponade or effusive/constrictive physiology. There is a severely dilated RV with severe RV dysfunction, possible pulmonary hypertension with RV dysfunction and secondary pericardial effusion as the main issue here. Heidi [name] Electronically Signed   By: Ezra Shuck M.D.   On: 01/08/2025 16:40   MR CARDIAC MORPHOLOGY W WO CONTRAST Result Date: 01/08/2025 CLINICAL DATA:  Pericardial effusion,?Effusive/constrictive pericarditis EXAM: CARDIAC MRI TECHNIQUE: The patient was scanned on a 1.5 Tesla GE magnet. A dedicated cardiac coil was used. Functional imaging was done using Fiesta sequences. 2,3, and 4 chamber views were done to assess for RWMA's. Modified Simpson's rule using a short axis stack was used to calculate an ejection fraction on a dedicated work [employer]. The patient received 8 cc of Gadavist . After 10 minutes inversion recovery sequences were used to assess for infiltration and scar tissue. FINDINGS: Technically difficult study. Patient had trouble holding her breath and had to get out of scanner to use the bathroom. Limited images of the lung fields showed extensive consolidation/airspace disease/scarring right upper lobe and less prominently in the left upper lobe. Small to moderate right pleural  effusion. Moderate circumferential pericardial effusion. No right ventricular diastolic indentation, no significant right atrial indentation. Normal left ventricular size and wall thickness. D-shaped interventricular septum in systole and diastole suggestive of RV pressure/volume overload. Diffuse hypokinesis with LV EF 46%. With free-breathing, the septum remains D-shaped but there does not appear to be respirophasic variation in its position. Severe right ventricular dilation wtih RV EF 20%. Normal left atrial size. Mild right atrial enlargement. Trileaflet aortic valve with no significant stenosis or regurgitation. On delayed enhancement imaging, there is mid-wall late gadolinium enhancement (LGE) at the basal inferior RV insertion site. The pericardium does not enhance. MEASUREMENTS: MEASUREMENTS Free breathing sequence had to be used for volumetric measurements. LVEDV 113 mL LVEDVi 67 mL/m2 LVSV 52 mL LVEF 46% RVEDV 282 mL RVEDVi 167 mL/m2 RVSV 57 mL RVEF 20% The aortic flow sequence does not look accurate, was not quantified. T1 quantification did not appear accurate. T2 quantification did not appear accurate. IMPRESSION: 1. Technically difficult study. Unable to use flow sequences or T1 and T2 sequences for quantification. Free breathing sequences had to be used for volumetric calculations. 2.  Scarring/consolidation noted in the right upper lobe. 3.  Normal LV size with  LV EF 46%, mild diffuse hypokinesis. 4. D-shaped interventricular septum suggestive of RV pressure/volume overload. Severely dilated RV with RV EF 20%. 5. Moderate circumferential pericardial effusion without evidence for pericardial tamponade. 6. Mid-wall LGE at the basal inferior RV insertion site. This is a nonspecific finding suggestive of pressure/volume overload. No definite pericardial enhancement noted. 7. Free breathing sequences did not appear to show respirophasic variation of the interventricular septal position. There is a  moderate pericardial effusion without definitive evidence for tamponade or effusive/constrictive physiology. There is a severely dilated RV with severe RV dysfunction, possible pulmonary hypertension with RV dysfunction and secondary pericardial effusion as the main issue here. Heidi [name] Electronically Signed   By: Ezra Shuck M.D.   On: 01/08/2025 16:40   DG CHEST PORT 1 VIEW Result Date: 01/08/2025 CLINICAL DATA:  Shortness of breath EXAM: PORTABLE CHEST 1 VIEW COMPARISON:  Yesterday FINDINGS: Stable cardiomegaly. Left internal jugular Port-A-Cath is unchanged. Mild central pulmonary vascular congestion is noted. Stable right perihilar and basilar opacities are noted concerning for possible infiltrate, atelectasis or scarring minimal right pleural effusion may be present. IMPRESSION: Stable right perihilar and basilar opacities as described above. Electronically Signed   By: Lynwood Landy Raddle M.D.   On: 01/08/2025 10:24   DG CHEST PORT 1 VIEW Result Date: 01/07/2025 CLINICAL DATA:  Shortness of breath. EXAM: PORTABLE CHEST 1 VIEW COMPARISON:  Chest radiograph dated 01/02/2025. FINDINGS: Left-sided Port-A-Cath in similar position. Stable cardiomegaly with vascular congestion and edema similar or slightly worsened since the prior radiograph. Superimposed pneumonia is not excluded. Trace right pleural effusions suspected. No pneumothorax. No acute osseous pathology. IMPRESSION: Cardiomegaly with vascular congestion and edema similar or slightly worsened since the prior radiograph. Electronically Signed   By: Vanetta Chou M.D.   On: 01/07/2025 18:42   CARDIAC CATHETERIZATION Result Date: 01/07/2025 1. Elevated right and left heart filling pressures. 2. Severe mixed pulmonary arterial/pulmonary venous hypertension. 3. Low CI by thermo, higher by Fick. 4. Simultaneous LV/RV pressure tracings were not suggestive of ventricular interdependence (LV and RV pressures track together).  Equalization of  diastolic pressures was not present.  This was not suggestive of effusive/constrictive pericarditis.   ECHOCARDIOGRAM COMPLETE Result Date: 01/03/2025    ECHOCARDIOGRAM REPORT   Patient Name:   MIKELA SENN Date of Exam: 01/03/2025 Medical Rec #:  [id number]          Height:       67.0 in Accession #:    [id number]         Weight:       144.0 lb Date of Birth:  1986-10-30         BSA:          1.759 m Patient Age:    38 years           BP:           121/82 mmHg Patient Gender: F                  HR:           93 bpm. Exam Location:  Inpatient Procedure: 2D Echo, Cardiac Doppler and Color Doppler (Both Spectral and Color            Flow Doppler were utilized during procedure). Indications:    CHF I50.9  History:        Patient has prior history of Echocardiogram examinations, most  recent 09/08/2023.  Sonographer:    Nathanel Devonshire Referring Phys: [id number] DAVID MANUEL ORTIZ IMPRESSIONS  1. Left ventricular ejection fraction, by estimation, is 65 to 70%. The left ventricle has normal function. The left ventricle has no regional wall motion abnormalities. Left ventricular diastolic parameters are indeterminate. There is the interventricular septum is flattened in systole and diastole, consistent with right ventricular pressure and volume overload.  2. Right ventricular systolic function is moderately reduced. The right ventricular size is mildly enlarged. There is severely elevated pulmonary artery systolic pressure. The estimated right ventricular systolic pressure is 64.2 mmHg.  3. Left atrial size was mildly dilated.  4. Right atrial size was moderately dilated.  5. Moderate pericardial effusion without significant inflow velocity variation, no RA/RV collapse, IVC is normal size but does not collapse. Pericardium is best seen on subcostal images, measures 0.63 cm adjacent to RA during diastole, consistent with significant thickening. Moderate pericardial effusion. The pericardial effusion is  circumferential.  6. The mitral valve is grossly normal. Trivial mitral valve regurgitation. No evidence of mitral stenosis.  7. The aortic valve is grossly normal. Aortic valve regurgitation is not visualized. No aortic stenosis is present.  8. The inferior vena cava is normal in size with <50% respiratory variability, suggesting right atrial pressure of 8 mmHg. Conclusion(s)/Recommendation(s): With pericardial thickening, severely elevated PA pressures, and history of lymphoma treatment (no mantle radiation that I see, but focal palliative radiation to lung mass in 2017, plus multiple rounds of chemotherapy), concern would be for constriction causing pulmonary hypertension and right sided heart failure. Tissue doppler signals on current study not well aligned, but on prior studies appear normal, arguing against restriction. Cannot exclude other possible etiologies of pulmonary hypertension as well. Would recommend consulting advanced heart failure to discuss right heart cath with constriction study versus alternative workup. FINDINGS  Left Ventricle: Left ventricular ejection fraction, by estimation, is 65 to 70%. The left ventricle has normal function. The left ventricle has no regional wall motion abnormalities. The left ventricular internal cavity size was normal in size. There is  no left ventricular hypertrophy. The interventricular septum is flattened in systole and diastole, consistent with right ventricular pressure and volume overload. Left ventricular diastolic parameters are indeterminate. Right Ventricle: The right ventricular size is mildly enlarged. Right vetricular wall thickness was not well visualized. Right ventricular systolic function is moderately reduced. There is severely elevated pulmonary artery systolic pressure. The tricuspid regurgitant velocity is 3.75 m/s, and with an assumed right atrial pressure of 8 mmHg, the estimated right ventricular systolic pressure is 64.2 mmHg. Left Atrium:  Left atrial size was mildly dilated. Right Atrium: Right atrial size was moderately dilated. Pericardium: Moderate pericardial effusion without significant inflow velocity variation, no RA/RV collapse, IVC is normal size but does not collapse. Pericardium is best seen on subcostal images, measures 0.63 cm adjacent to RA during diastole, consistent with significant thickening. A moderately sized pericardial effusion is present. The pericardial effusion is circumferential. Mitral Valve: The mitral valve is grossly normal. Trivial mitral valve regurgitation. No evidence of mitral valve stenosis. Tricuspid Valve: The tricuspid valve is grossly normal. Tricuspid valve regurgitation is mild . No evidence of tricuspid stenosis. Aortic Valve: The aortic valve is grossly normal. Aortic valve regurgitation is not visualized. No aortic stenosis is present. Aortic valve mean gradient measures 2.0 mmHg. Aortic valve peak gradient measures 3.2 mmHg. Aortic valve area, by VTI measures 2.45 cm. Pulmonic Valve: The pulmonic valve was not well visualized. Pulmonic valve regurgitation is  trivial. No evidence of pulmonic stenosis. Aorta: The aortic root is normal in size and structure. Venous: The inferior vena cava is normal in size with less than 50% respiratory variability, suggesting right atrial pressure of 8 mmHg. IAS/Shunts: The atrial septum is grossly normal. Additional Comments: There is a small pleural effusion in both left and right lateral regions.  LEFT VENTRICLE PLAX 2D LVIDd:         3.80 cm     Diastology LVIDs:         2.50 cm     LV e' medial:   4.79 cm/s LV PW:         0.90 cm     LV E/e' medial: 19.9 LV IVS:        0.90 cm LVOT diam:     1.90 cm LV SV:         37 LV SV Index:   21 LVOT Area:     2.84 cm  LV Volumes (MOD) LV vol d, MOD A2C: 41.2 ml LV vol d, MOD A4C: 37.2 ml LV vol s, MOD A2C: 15.3 ml LV vol s, MOD A4C: 10.4 ml LV SV MOD A2C:     25.9 ml LV SV MOD A4C:     37.2 ml LV SV MOD BP:      29.6 ml RIGHT  VENTRICLE          IVC RV Basal diam:  4.00 cm  IVC diam: 1.90 cm TAPSE (M-mode): 1.1 cm LEFT ATRIUM             Index        RIGHT ATRIUM           Index LA diam:        2.70 cm 1.54 cm/m   RA Area:     18.20 cm LA Vol (A2C):   48.2 ml 27.41 ml/m  RA Volume:   53.50 ml  30.42 ml/m LA Vol (A4C):   52.4 ml 29.79 ml/m LA Biplane Vol: 53.1 ml 30.19 ml/m  AORTIC VALVE AV Area (Vmax):    2.71 cm AV Area (Vmean):   2.72 cm AV Area (VTI):     2.45 cm AV Vmax:           89.70 cm/s AV Vmean:          56.900 cm/s AV VTI:            0.149 m AV Peak Grad:      3.2 mmHg AV Mean Grad:      2.0 mmHg LVOT Vmax:         85.80 cm/s LVOT Vmean:        54.500 cm/s LVOT VTI:          0.129 m LVOT/AV VTI ratio: 0.87  AORTA Ao Root diam: 2.80 cm MITRAL VALVE                TRICUSPID VALVE MV Area (PHT): 6.67 cm     TR Peak grad:   56.2 mmHg MV E velocity: 95.20 cm/s   TR Vmax:        375.00 cm/s MV A velocity: 110.00 cm/s MV E/A ratio:  0.87         SHUNTS                             Systemic VTI:  0.13 m  Systemic Diam: 1.90 cm Shelda Bruckner MD Electronically signed by Shelda Bruckner MD Signature Date/Time: 01/03/2025/9:02:04 PM    Final    MR LIVER W WO CONTRAST Result Date: 01/03/2025 CLINICAL DATA:  Ascites Malignant ascites. Hx of Hodgkin's lymphoma. * Tracking Code: BO * EXAM: MRI ABDOMEN WITHOUT AND WITH CONTRAST TECHNIQUE: Multiplanar multisequence MR imaging of the abdomen was performed both before and after the administration of intravenous contrast. CONTRAST:  6mL GADAVIST  GADOBUTROL  1 MMOL/ML IV SOLN COMPARISON:  CT scan abdomen and pelvis from 01/02/2025. FINDINGS: Technologist noted limited exam. Patient premedicated due to claustrophobia. Patient falling asleep. Lower chest: Markedly enlarged heart. There is small right pleural effusion. No significant left pleural effusion. There are atelectatic changes at the bilateral lung bases, right more than left, better evaluated  on the CT angiography chest from yesterday. Hepatobiliary: The liver is mildly enlarged measuring up to 16.3 cm in length. Noncirrhotic configuration. No focal lesion. The heterogeneous appearance seen on the CT scan abdomen and pelvis from yesterday was likely due to phase of contrast. No intrahepatic or extrahepatic bile duct dilatation. No choledocholithiasis. Gallbladder is physiologically distended. There is mild diffuse gallbladder wall edema, likely secondary to systemic causes. No pericholecystic fat stranding. No bladder calculi. Pancreas: No mass, inflammatory changes or other parenchymal abnormality identified. No main pancreatic duct dilation. Spleen:  Within normal limits in size and appearance. No focal mass. Adrenals/Urinary Tract: Unremarkable adrenal glands. No hydroureteronephrosis. No suspicious renal mass. Stomach/Bowel: Visualized portions within the abdomen are unremarkable. No disproportionate dilation of bowel loops. Vascular/Lymphatic: No pathologically enlarged lymph nodes identified. No abdominal aortic aneurysm demonstrated. There is small amount of ascites. Other:  There is mild-to-moderate anasarca. Musculoskeletal: Redemonstration of altered signal intensity lesions in the T12, L1, L3 and L5 vertebrae, which corresponds to mixed sclerotic/lytic lesions on the recent CT scan. These were previously characterized as osseous metastases. No discrete pathological fracture seen. IMPRESSION: 1. No focal liver lesion seen. Mild hepatomegaly. The heterogeneous appearance seen on the CT scan abdomen and pelvis from yesterday was likely due to phase of contrast. 2. No biliary ductal dilation. No choledocholithiasis. 3. Small ascites. Mild-to-moderate anasarca. 4. Redemonstration of multiple osseous metastases. No discrete pathological fracture. Electronically Signed   By: Ree Molt M.D.   On: 01/03/2025 13:07   CT Angio Chest PE W and/or Wo Contrast Result Date: 01/02/2025 EXAM: CTA of the  Chest with contrast for PE 01/02/2025 03:22:03 PM TECHNIQUE: CTA of the chest was performed after the administration of 100 mL of iohexol  (OMNIPAQUE ) 350 MG/ML injection. Multiplanar reformatted images are provided for review. MIP images are provided for review. Automated exposure control, iterative reconstruction, and/or weight based adjustment of the mA/kV was utilized to reduce the radiation dose to as low as reasonably achievable. COMPARISON: 12/11/2024 CLINICAL HISTORY: Pulmonary embolism (PE) suspected, high prob. FINDINGS: PULMONARY ARTERIES: No pulmonary embolism. Unchanged dilation of the main pulmonary artery measuring up to 3.3 cm, suggestive of unaligned pulmonary arterial hypertension. Turbulent flow related artifact in the right pulmonary artery. MEDIASTINUM: Unchanged cardiomegaly with a moderate to large volume pericardial effusion. Reflux of contrast into the dilated intrahepatic IVC and the hepatic veins, consistent with underlying cardiac dysfunction. Left chest port terminates at the cavoatrial junction. Rightward shift of the cardiomediastinal structures due to right sided volume loss. There is no acute abnormality of the thoracic aorta. LYMPH NODES: No mediastinal, hilar or axillary lymphadenopathy. LUNGS AND PLEURA: Similar perihilar consolidation in the right lung with scattered areas of perihilar consolidation in  the left upper lobe and lingula, likely scarring. Small right pleural effusion is also unchanged. Mild intralobular septal thickening noted within both upper lobes. Regions of air trapping also noted predominantly in the left lung. No pneumothorax. UPPER ABDOMEN: Partially visualized upper abdominal ascites. Mild anasarca. SOFT TISSUES AND BONES: No acute bone or soft tissue abnormality. IMPRESSION: 1. No evidence of pulmonary embolism. 2. Unchanged cardiomegaly with a moderate to large volume pericardial effusion. Reflux of contrast into the dilated intrahepatic IVC and hepatic  veins, consistent with cardiac dysfunction. Mild intralobular septal thickening predominantly noted within both upper lobes , suggesting interstitial edema. 3. Unchanged dilation of the main pulmonary artery measuring up to 3.3 cm, suggestive of pulmonary arterial hypertension. 4. Small right pleural effusion, unchanged. Mild anasarca. Electronically signed by: Rogelia Myers MD MD 01/02/2025 03:37 PM EST RP Workstation: [id number]   CT ABDOMEN PELVIS W CONTRAST Result Date: 01/02/2025 CLINICAL DATA:  Abdominal swelling for 2 weeks. History of lymphoma. EXAM: CT ABDOMEN AND PELVIS WITH CONTRAST TECHNIQUE: Multidetector CT imaging of the abdomen and pelvis was performed using the standard protocol following bolus administration of intravenous contrast. RADIATION DOSE REDUCTION: This exam was performed according to the departmental dose-optimization program which includes automated exposure control, adjustment of the mA and/or kV according to patient size and/or use of iterative reconstruction technique. CONTRAST:  100 mL OMNIPAQUE  IOHEXOL  300 MG/ML SOLN, OMNIPAQUE  IOHEXOL  350 MG/ML SOLN COMPARISON:  PET scan of August 24, 2024. CT scan of February 17, 2021. FINDINGS: Hepatobiliary: Diffusely heterogeneous appearance of hepatic parenchyma is noted. Metastatic disease cannot be excluded. No cholelithiasis is noted, but moderate gallbladder wall thickening is noted which may be due to adjacent hepatocellular disease or ascites. No biliary dilatation is noted. Pancreas: Unremarkable. No pancreatic ductal dilatation or surrounding inflammatory changes. Spleen: Normal in size without focal abnormality. Adrenals/Urinary Tract: Adrenal glands are unremarkable. Kidneys are normal, without renal calculi, focal lesion, or hydronephrosis. Bladder is unremarkable. Stomach/Bowel: Stomach is within normal limits. Appendix appears normal. No evidence of bowel wall thickening, distention, or inflammatory changes.  Vascular/Lymphatic: No significant vascular findings are present. No enlarged abdominal or pelvic lymph nodes. Reproductive: Uterus and bilateral adnexa are unremarkable. Other: Mild ascites is noted.  No definite hernia is noted. Musculoskeletal: Ill-defined sclerotic densities are again noted in the spine and pelvis concerning for metastatic disease. IMPRESSION: 1. Diffusely heterogeneous appearance of hepatic parenchyma is noted concerning for metastatic disease. MRI is recommended for further evaluation. 2. Mild ascites is noted. 3. Moderate gallbladder wall thickening is noted which may be due to adjacent hepatocellular disease or ascites. 4. Ill-defined sclerotic densities are again noted in the spine and pelvis concerning for metastatic disease. Electronically Signed   By: Lynwood Landy Raddle M.D.   On: 01/02/2025 15:34   DG Chest Port 1 View Result Date: 01/02/2025 EXAM: 1 VIEW(S) XRAY OF THE CHEST 01/02/2025 01:01:00 PM COMPARISON: 12/11/2024 CLINICAL HISTORY: shob FINDINGS: LINES, TUBES AND DEVICES: Left Port-A-Cath in place with tip overlying the expected region of the superior cavoatrial junction. LUNGS AND PLEURA: Mild bilateral interstitial opacities with similar perihilar consolidation on the right with chronic blunting of the right costophrenic sulcus and elevation of the right hemidiaphragm. This likely represents a combination of scarring and small right pleural effusion. Perihilar reticular opacity is also noted on the lef also likely a combination of subsegmental atelectasis and scarring. t. No pneumothorax. HEART AND MEDIASTINUM: Similar enlargement of the cardiac silhouette. BONES AND SOFT TISSUES: No acute osseous abnormality. IMPRESSION: 1.  Similar enlargement of the cardiac silhouette, which was previously noted to be a combination of cardiomegaly and moderate pericardial effusion. No pneumonia or pulmonary edema. Electronically signed by: Rogelia Myers MD 01/02/2025 01:27 PM EST RP  Workstation: [id number]   CT Angio Chest PE W and/or Wo Contrast Result Date: 12/11/2024 EXAM: CTA CHEST 12/11/2024 10:39:28 PM TECHNIQUE: CTA of the chest was performed after the administration of 75 mL of iohexol  (OMNIPAQUE ) 350 MG/ML injection. Multiplanar reformatted images are provided for review. MIP images are provided for review. Automated exposure control, iterative reconstruction, and/or weight based adjustment of the mA/kV was utilized to reduce the radiation dose to as low as reasonably achievable. COMPARISON: None available. CLINICAL HISTORY: Hypoxia with new oxygen requirement, shortness of breath, elevated D-dimer. Hodgkin's lymphoma. *tracking code: Bo* FINDINGS: PULMONARY ARTERIES: Pulmonary arteries are adequately opacified for evaluation. The central pulmonary arteries are enlarged in keeping with changes of pulmonary arterial hypertension. No pulmonary embolism. MEDIASTINUM: Moderate cardiomegaly, stable. Moderate pericardial effusion, enlarged. Left internal jugular chest port tip seen within the superior vena cava. Stable mediastinal shift to the right secondary to right-sided volume loss. Visualized thyroid  is unremarkable. The esophagus is unremarkable. There is no acute abnormality of the thoracic aorta. LYMPH NODES: No pathologic thoracic adenopathy. LUNGS AND PLEURA: Small to moderate right pleural effusion, enlarged since prior examination. Perihilar post radiation fibrotic change, right greater than left, appears stable with associated right-sided volume loss. Asymmetric ground-glass pulmonary infiltrate and interlobular septal thickening within the left lung, compatible with asymmetric pulmonary edema is unchanged. No pneumothorax. UPPER ABDOMEN: Limited images of the upper abdomen are unremarkable. SOFT TISSUES AND BONES: Osseous structures are age appropriate. No acute bone abnormality. No lytic or blastic bone lesion. No acute soft tissue abnormality. IMPRESSION: 1. No pulmonary  embolism. 2. Unchanged asymmetric ground-glass pulmonary infiltrate and interlobular septal thickening within the left lung, compatible with asymmetric pulmonary edema. 3. Small to moderate right pleural effusion, enlarged since prior examination. 4. Moderate pericardial effusion, enlarged. 5. Moderate cardiomegaly, stable. 6. Enlarged central pulmonary arteries consistent with pulmonary arterial hypertension. 7. Stable perihilar post radiation fibrotic change, right greater than left, with associated right-sided volume loss and stable mediastinal shift to the right. 8. Left internal jugular chest port tip in the superior vena cava. Electronically signed by: Dorethia Molt MD 12/11/2024 11:24 PM EST RP Workstation: [id number]   DG Chest 2 View Result Date: 12/11/2024 EXAM: 2 VIEW(S) XRAY OF THE CHEST 12/11/2024 08:37:00 PM COMPARISON: Comparison with 04/24/2024. CLINICAL HISTORY: shortness of breath, hypoxia FINDINGS: LINES, TUBES AND DEVICES: Port type central venous catheter on the left with tip over the low SVC region. LUNGS AND PLEURA: Scarring in the hilar regions of the lungs with a diffuse interstitial pattern likely representing fibrosis. No change since previous study. No vascular congestion or edema. No pleural effusion. No pneumothorax. HEART AND MEDIASTINUM: Diffuse cardiac enlargement. BONES AND SOFT TISSUES: Degenerative changes in the spine. IMPRESSION: 1. No acute cardiopulmonary findings. 2. Scarring in the hilar regions of the lungs with a diffuse interstitial pattern likely representing fibrosis and post-treatment changes , unchanged since previous study. 3. Diffuse cardiac enlargement without vascular congestion or edema. Electronically signed by: Elsie Gravely MD 12/11/2024 08:45 PM EST RP Workstation: [id number]   Microbiology: Results for orders placed or performed during the hospital encounter of 12/11/24  Resp panel by RT-PCR (RSV, Flu A&B, Covid) Anterior Nasal Swab     Status:  None   Collection Time: 12/11/24  7:12 PM   Specimen: Anterior  Nasal Swab  Result Value Ref Range Status   SARS Coronavirus 2 by RT PCR NEGATIVE NEGATIVE Final   Influenza A by PCR NEGATIVE NEGATIVE Final   Influenza B by PCR NEGATIVE NEGATIVE Final    Comment: (NOTE) The Xpert Xpress SARS-CoV-2/FLU/RSV plus assay is intended as an aid in the diagnosis of influenza from Nasopharyngeal swab specimens and should not be used as a sole basis for treatment. Nasal washings and aspirates are unacceptable for Xpert Xpress SARS-CoV-2/FLU/RSV testing.  Fact Sheet for Patients: bloggercourse.com  Fact Sheet for Healthcare Providers: seriousbroker.it  This test is not yet approved or cleared by the [place]  FDA and has been authorized for detection and/or diagnosis of SARS-CoV-2 by FDA under an Emergency Use Authorization (EUA). This EUA will remain in effect (meaning this test can be used) for the duration of the COVID-19 declaration under Section 564(b)(1) of the Act, 21 U.S.C. section 360bbb-3(b)(1), unless the authorization is terminated or revoked.     Resp Syncytial Virus by PCR NEGATIVE NEGATIVE Final    Comment: (NOTE) Fact Sheet for Patients: bloggercourse.com  Fact Sheet for Healthcare Providers: seriousbroker.it  This test is not yet approved or cleared by the [place]  FDA and has been authorized for detection and/or diagnosis of SARS-CoV-2 by FDA under an Emergency Use Authorization (EUA). This EUA will remain in effect (meaning this test can be used) for the duration of the COVID-19 declaration under Section 564(b)(1) of the Act, 21 U.S.C. section 360bbb-3(b)(1), unless the authorization is terminated or revoked.  Performed at [facility], [address] [place], [place] [address]    *Note: Due to a large number of results and/or encounters for the  requested time period, some results have not been displayed. A complete set of results can be found in Results Review.   Labs: CBC: Recent Labs  Lab 01/02/25 1256 01/02/25 1306 01/03/25 0527 01/05/25 0344 01/06/25 1035 01/07/25 0502 01/07/25 0830 01/07/25 0831 01/08/25 0604  WBC 3.3*  --  4.1 5.2 4.5 4.2  --   --  5.0  NEUTROABS 2.6  --   --   --  3.6 3.4  --   --  4.0  HGB 12.7   < > 13.1 12.7 12.3 12.0 12.9 12.9 11.8*  HCT 37.6   < > 40.3 39.9 37.7 36.3 38.0 38.0 35.3*  MCV 97.4  --  100.0 101.3* 98.4 98.1  --   --  98.9  PLT 199  --  214 216 209 222  --   --  234   < > = values in this interval not displayed.   Basic Metabolic Panel: Recent Labs  Lab 01/05/25 0344 01/06/25 0546 01/06/25 1035 01/07/25 0502 01/07/25 0830 01/07/25 0831 01/08/25 0604  NA 137 136 135 136 133* 133* 137  K 3.7 3.6 3.7 3.7 3.5 3.6 4.0  CL 96* 92* 91* 91*  --   --  90*  CO2 34* 39* 36* 39*  --   --  40*  GLUCOSE 99 109* 99 90  --   --  82  BUN 12 10 9 8   --   --  10  CREATININE 0.95 0.82 0.85 0.75  --   --  0.92  CALCIUM  8.5* 8.2* 8.6* 7.9*  --   --  8.7*  MG 1.9 1.4* 1.3* 2.1  --   --  1.6*  PHOS  --   --  2.9 2.7  --   --  2.5  Liver Function Tests: Recent Labs  Lab 01/02/25 1256 01/03/25 0527 01/06/25 1035 01/07/25 0502 01/08/25 0604  AST 42* 38 23 21 23   ALT 31 34 23 21 16   ALKPHOS 68 70 75 85 82  BILITOT 1.3* 0.8 1.3* 1.5* 1.1  PROT 5.6* 6.1* 5.8* 5.7* 5.5*  ALBUMIN  3.7 3.7 3.6 3.5 3.3*   CBG: No results for input(s): GLUCAP in the last 168 hours.  Discharge time spent: less than 30 minutes.  Signed: Alejandro Marker, DO [facility] 01/08/2025 "

## 2025-01-09 LAB — LIPOPROTEIN A (LPA): Lipoprotein (a): 89.9 nmol/L — ABNORMAL HIGH

## 2025-01-09 LAB — CENTROMERE ANTIBODIES: Centromere Ab Screen: <0.2 AI (ref 0.0–0.9)

## 2025-01-09 LAB — ANTI-SCLERODERMA ANTIBODY: Scleroderma (Scl-70) (ENA) Antibody, IgG: <0.2 AI (ref 0.0–0.9)

## 2025-01-16 NOTE — Progress Notes (Incomplete)
 "  ADVANCED HF CLINIC CONSULT NOTE  Referring Physician: Pcp, No Primary Care: Pcp, No Primary Cardiologist: None  Chief Complaint:  HPI: Heidi [name] is a 39 y.o. female with history of Hodgkin's lymphoma, SVC syndrome s/p chest wall radiation, smoking, and CHF.  Hodgkin's lymphoma was found in 2017.  She was treated with several regimens of chemotherapy to induce remission.  Treatment complicated by noncompliance.  She refused consideration of stem cell transplant.  Treatment was also complicated by right lung mass causing SVC syndrome.  She had palliative radiation to lung mass. She is a smoker.  Echo in 7/24 showed moderate pericardial effusion, no mention of RV function.  Echo in 9/24 showed EF 65-70%, D-shaped interventricular septum (unable to estimate PA systolic pressure), small-moderate pericardial effusion, IVC dilated.    Prior to this admission, patient reports about 2 wks of progressive dyspnea as well as abdominal swelling and peripheral edema.  No chest pain.  No lightheadedness.  At admission, pro-BNP was elevated.  Echo showed EF 65-70%, D-shaped septum, mild RV enlargement with moderate RV dysfunction, PASP 64 mmHg, moderate pericardial effusion without tamponade, pericardial thickening, IVC normal in size but does not collapse. 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  Patient was started on po Lasix  and brought for RHC/hemodynamic LHC today.    RHC/hemodynamic LHC today: Hemodynamics (mmHg) RA mean 15 RV 73/21 PA 72/36, mean 53 PCWP mean 24 LV 128/14 AO 110/75 Oxygen saturations: PA 59% AO 92% Cardiac Output (Fick) 4.33  Cardiac Index (Fick) 2.47 PVR 6.7 WU  Cardiac Output (Thermo) 3.68  Cardiac Index (Thermo) 2.1 PVR 7.9 WU PAPi 2.4 Simultaneous LV/RV pressure tracings showed that the RV and LV pressures tracked together, there did not appear to be ventricular interdependence.     Past Medical History:  Diagnosis Date   ARDS (adult respiratory  distress syndrome) (HCC)    Asthma    Hodgkin lymphoma (HCC)    Hypertension     Current Outpatient Medications  Medication Sig Dispense Refill   albuterol  (VENTOLIN  HFA) 108 (90 Base) MCG/ACT inhaler Inhale 1-2 puffs into the lungs every 6 (six) hours as needed for wheezing or shortness of breath. 18 g 0   budesonide -formoterol  (SYMBICORT ) 80-4.5 MCG/ACT inhaler Inhale 2 puffs into the lungs 2 (two) times daily as needed. (Patient taking differently: Inhale 2 puffs into the lungs 2 (two) times daily as needed (for respiratory flares).) 1 each 0   furosemide  (LASIX ) 40 MG tablet Take 1 tablet (40 mg total) by mouth daily. 30 tablet 11   predniSONE  (STERAPRED UNI-PAK 21 TAB) 10 MG (21) TBPK tablet Take 40 mg (4 tablets) days 1 and 2, take 30 mg (3 tablets) days 3 and 4, take 20 mg (2 tablets) days 5 and 6, take 10 mg (1 tablet) days 7 and 8, take 5 mg (0.5 mg) days 9 and 10, then stop. (Patient not taking: Reported on 01/02/2025) 21 tablet 0   No current facility-administered medications for this visit.   Facility-Administered Medications Ordered in Other Visits  Medication Dose Route Frequency Provider Last Rate Last Admin   sodium chloride  flush (NS) 0.9 % injection 10 mL  10 mL Intracatheter PRN Kale, Gautam Kishore, MD        Allergies[1]    Social History   Socioeconomic History   Marital status: Single    Spouse name: Not on file   Number of children: Not on file   Years of education: Not on file   Highest education level:  Not on file  Occupational History   Not on file  Tobacco Use   Smoking status: Some Days    Current packs/day: 0.00    Average packs/day: 0.5 packs/day for 15.0 years (7.5 ttl pk-yrs)    Types: Cigarettes    Start date: 03/27/2001    Last attempt to quit: 03/27/2016    Years since quitting: 8.8   Smokeless tobacco: Never  Vaping Use   Vaping status: Never Used  Substance and Sexual Activity   Alcohol use: Yes    Comment: occasional now   Drug use: Not  Currently   Sexual activity: Yes    Birth control/protection: None  Other Topics Concern   Not on file  Social History Narrative   Not on file   Social Drivers of Health   Tobacco Use: High Risk (01/02/2025)   Patient History    Smoking Tobacco Use: Some Days    Smokeless Tobacco Use: Never    Passive Exposure: Not on file  Financial Resource Strain: Not on file  Food Insecurity: No Food Insecurity (01/03/2025)   Epic    Worried About Programme Researcher, Broadcasting/film/video in the Last Year: Never true    Ran Out of Food in the Last Year: Never true  Transportation Needs: No Transportation Needs (01/03/2025)   Epic    Lack of Transportation (Medical): No    Lack of Transportation (Non-Medical): No  Physical Activity: Not on file  Stress: Not on file  Social Connections: Not on file  Intimate Partner Violence: Not At Risk (01/03/2025)   Epic    Fear of Current or Ex-Partner: No    Emotionally Abused: No    Physically Abused: No    Sexually Abused: No  Depression (PHQ2-9): Not on file  Alcohol Screen: Not on file  Housing: Unknown (01/03/2025)   Epic    Unable to Pay for Housing in the Last Year: No    Number of Times Moved in the Last Year: Not on file    Homeless in the Last Year: No  Utilities: Not At Risk (01/03/2025)   Epic    Threatened with loss of utilities: No  Health Literacy: Not on file      Family History  Problem Relation Age of Onset   Hypertension Mother     There were no vitals filed for this visit.  PHYSICAL EXAM: General:  Well appearing. No respiratory difficulty HEENT: normal Neck: supple. no JVD. Carotids 2+ bilat; no bruits. No lymphadenopathy or thryomegaly appreciated. Cor: PMI nondisplaced. Regular rate & rhythm. No rubs, gallops or murmurs. Lungs: clear Abdomen: soft, nontender, nondistended. No hepatosplenomegaly. No bruits or masses. Good bowel sounds. Extremities: no cyanosis, clubbing, rash, edema Neuro: alert & oriented x 3, cranial nerves grossly intact.  moves all 4 extremities w/o difficulty. Affect pleasant.  ECG:   ASSESSMENT & PLAN: 1. Pericardial disease: Patient has had a moderate pericardial effusion since at least 7/24.  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  She has a history of Hodgkins lymphoma and radiation to the right chest due to SVC syndrome.  RHC/hemodynamic LHC 01/07/25 showed elevated R/L-sided filling pressures but did not show equalization of diastolic pressures. Simultaneous LV and RV pressures tracings were not suggestive of ventricular interdependence (LV and RV pressures appeared to track together rather than oppositely).  This did not give us  evidence for constrictive physiology.  - Plan for cardiac MRI today with free  breathing sequences for further evaluation of constrictive physiology.  2. CHF: Echo this admission showed EF 65-70%, D-shaped septum, mild RV enlargement with moderate RV dysfunction, PASP 64 mmHg, moderate pericardial effusion without tamponade, pericardial thickening, IVC normal in size but does not collapse. As above, RHC did not confirm pericardial constriction-type physiology.  There appeared to be elevated PCWP consistent with LV diastolic dysfunction, severe mixed pulmonary arterial/pulmonary venous hypertension, and RV failure with elevated RA pressure.  - Euvolemic on exam. Hold further diuresis. -5.8L UOP.  - Continue Farxiga  10 mg daily.  - Start spiro 12.5 mg daily - cardiac MRI today.  3. Pulmonary hypertension: Cath today showed severe mixed pulmonary venous/pulmonary arterial hypertension.  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 ANA, RF, HIV negative. CTA chest with no PE.  - Needs diuresis as above.  - Complete autoimmune workup with anti-SCL-70, anti-centromere Abs.   - Need to complete workup for constrictive physiology with cardiac MRI. Ordered - Ideally would get repeat RHC after diuresis  though not sure she will agree to this.  4. SVC syndrome: She had palliative radiation to right lung mass in the past.  Of note, unable to do RHC from right brachial area due to obstruction of right subclavian, unable to pass Swan into SVC.      Medication concerns reviewed with patient and pharmacy team. Barriers identified: has threatened to leave AMA this admission.    Work on weaning oxygen now that euvolemic. Ambulate. Will arrange close f/u in AHF clinic.        [1]  Allergies Allergen Reactions   Doxorubicin  Hcl Liposomal Anaphylaxis, Shortness Of Breath and Other (See Comments)    Pt had severe hypersensitivity reaction with chest pain and SOB immediately following start of medication. See hypersensitivity note from 07/14/2023.   "

## 2025-01-17 ENCOUNTER — Telehealth (HOSPITAL_COMMUNITY): Payer: Self-pay

## 2025-01-17 NOTE — Telephone Encounter (Signed)
 Called to confirm/remind patient of their appointment at the [facility] on 01/18/25.   Appointment:   [x] Confirmed  [] Left mess   [] No answer/No voice mail  [] VM Full/unable to leave message  [] Phone not in service  Patient reminded to bring all medications and/or complete list.  Confirmed patient has transportation. Gave directions, instructed to utilize valet parking.

## 2025-01-18 ENCOUNTER — Ambulatory Visit (HOSPITAL_COMMUNITY)

## 2025-01-22 ENCOUNTER — Ambulatory Visit (HOSPITAL_COMMUNITY): Admitting: Internal Medicine

## 2025-01-25 ENCOUNTER — Telehealth (HOSPITAL_COMMUNITY): Payer: Self-pay

## 2025-01-25 NOTE — Telephone Encounter (Signed)
 Called to confirm/remind patient of their appointment at the [facility] on 01/28/25.   Appointment:   [] Confirmed  [] Left mess   [x] No answer/No voice mail  [] VM Full/unable to leave message  [] Phone not in service

## 2025-01-27 ENCOUNTER — Telehealth (HOSPITAL_COMMUNITY): Payer: Self-pay

## 2025-01-27 NOTE — Telephone Encounter (Signed)
 Called to confirm/remind patient of their appointment at the [facility] on 01/28/25.   Appointment:   [] Confirmed  [] Left mess   [x] No answer/No voice mail  [] VM Full/unable to leave message  [] Phone not in service

## 2025-01-28 ENCOUNTER — Ambulatory Visit (HOSPITAL_COMMUNITY)

## 2025-01-28 ENCOUNTER — Telehealth (HOSPITAL_COMMUNITY): Payer: Self-pay

## 2025-01-29 ENCOUNTER — Telehealth (HOSPITAL_COMMUNITY): Payer: Self-pay

## 2025-01-29 ENCOUNTER — Other Ambulatory Visit (HOSPITAL_COMMUNITY): Payer: Self-pay

## 2025-01-29 ENCOUNTER — Encounter (HOSPITAL_COMMUNITY): Payer: Self-pay

## 2025-01-29 ENCOUNTER — Ambulatory Visit (HOSPITAL_COMMUNITY): Admission: RE | Admit: 2025-01-29 | Discharge: 2025-01-29 | Attending: Family Medicine

## 2025-01-29 VITALS — BP 110/76 | HR 110 | Wt 140.6 lb

## 2025-01-29 DIAGNOSIS — I319 Disease of pericardium, unspecified: Secondary | ICD-10-CM

## 2025-01-29 DIAGNOSIS — I871 Compression of vein: Secondary | ICD-10-CM

## 2025-01-29 DIAGNOSIS — E8770 Fluid overload, unspecified: Secondary | ICD-10-CM | POA: Insufficient documentation

## 2025-01-29 DIAGNOSIS — Z79899 Other long term (current) drug therapy: Secondary | ICD-10-CM | POA: Insufficient documentation

## 2025-01-29 DIAGNOSIS — I272 Pulmonary hypertension, unspecified: Secondary | ICD-10-CM | POA: Insufficient documentation

## 2025-01-29 DIAGNOSIS — F17210 Nicotine dependence, cigarettes, uncomplicated: Secondary | ICD-10-CM | POA: Insufficient documentation

## 2025-01-29 DIAGNOSIS — I5032 Chronic diastolic (congestive) heart failure: Secondary | ICD-10-CM | POA: Insufficient documentation

## 2025-01-29 DIAGNOSIS — Z923 Personal history of irradiation: Secondary | ICD-10-CM | POA: Insufficient documentation

## 2025-01-29 DIAGNOSIS — I3139 Other pericardial effusion (noninflammatory): Secondary | ICD-10-CM | POA: Insufficient documentation

## 2025-01-29 DIAGNOSIS — I110 Hypertensive heart disease with heart failure: Secondary | ICD-10-CM | POA: Insufficient documentation

## 2025-01-29 DIAGNOSIS — Z8571 Personal history of Hodgkin lymphoma: Secondary | ICD-10-CM | POA: Insufficient documentation

## 2025-01-29 DIAGNOSIS — Z7984 Long term (current) use of oral hypoglycemic drugs: Secondary | ICD-10-CM | POA: Insufficient documentation

## 2025-01-29 DIAGNOSIS — R42 Dizziness and giddiness: Secondary | ICD-10-CM | POA: Insufficient documentation

## 2025-01-29 LAB — CBC
HCT: 40 % (ref 36.0–46.0)
Hemoglobin: 13.3 g/dL (ref 12.0–15.0)
MCH: 32.9 pg (ref 26.0–34.0)
MCHC: 33.3 g/dL (ref 30.0–36.0)
MCV: 99 fL (ref 80.0–100.0)
Platelets: 296 10*3/uL (ref 150–400)
RBC: 4.04 MIL/uL (ref 3.87–5.11)
RDW: 15.2 % (ref 11.5–15.5)
WBC: 3.7 10*3/uL — ABNORMAL LOW (ref 4.0–10.5)
nRBC: 0 % (ref 0.0–0.2)

## 2025-01-29 LAB — BASIC METABOLIC PANEL WITH GFR
Anion gap: 13 (ref 5–15)
BUN: 16 mg/dL (ref 6–20)
CO2: 28 mmol/L (ref 22–32)
Calcium: 9.8 mg/dL (ref 8.9–10.3)
Chloride: 97 mmol/L — ABNORMAL LOW (ref 98–111)
Creatinine, Ser: 0.99 mg/dL (ref 0.44–1.00)
GFR, Estimated: >60 mL/min
Glucose, Bld: 76 mg/dL (ref 70–99)
Potassium: 4.2 mmol/L (ref 3.5–5.1)
Sodium: 137 mmol/L (ref 135–145)

## 2025-01-29 LAB — PRO BRAIN NATRIURETIC PEPTIDE: Pro Brain Natriuretic Peptide: 5550 pg/mL — ABNORMAL HIGH

## 2025-01-29 MED ORDER — CARVEDILOL 3.125 MG PO TABS: 3.1250 mg | ORAL_TABLET | Freq: Two times a day (BID) | ORAL | 3 refills | Status: AC

## 2025-01-29 MED ORDER — COLCHICINE 0.6 MG PO TABS: 0.6000 mg | ORAL_TABLET | Freq: Every day | ORAL | 1 refills | Status: AC

## 2025-01-29 MED ORDER — DAPAGLIFLOZIN PROPANEDIOL 10 MG PO TABS: 10.0000 mg | ORAL_TABLET | Freq: Every day | ORAL | 1 refills | Status: AC

## 2025-01-29 NOTE — Telephone Encounter (Signed)
 Advanced Heart Failure Patient Advocate Encounter  Test billing for this patient's current coverage [facility]) returns a $4 copay for 90 day supply of Farxiga.  This test claim was processed through [facility]- copay amounts may vary at other pharmacies due to pharmacy/plan contracts, or as the patient moves through the different stages of their insurance plan.  Rachel DEL, CPhT Rx Patient Advocate Phone: [phone]

## 2025-01-29 NOTE — Progress Notes (Signed)
 "  ADVANCED HF CLINIC CONSULT NOTE  Primary Care: Pcp, No Primary Cardiologist: None HF Cardiologist: Dr. Rolan  HPI: Heidi [name] is a 39 y.o. female with history of Hodgkin's lymphoma, SVC syndrome s/p chest wall radiation, smoking, and CHF.  Hodgkin's lymphoma was found in 2017.  She was treated with several regimens of chemotherapy to induce remission.  Treatment complicated by noncompliance.  She refused consideration of stem cell transplant.  Treatment was also complicated by right lung mass causing SVC syndrome.  She had palliative radiation to lung mass. She is a smoker.  Echo in 7/24 showed moderate pericardial effusion, no mention of RV function.  Echo in 9/24 showed EF 65-70%, D-shaped interventricular septum (unable to estimate PA systolic pressure), small-moderate pericardial effusion, IVC dilated.    Admitted 1/26 with acute RV failure. Echo showed EF 65-70%, D-shaped septum, mild RV enlargement with moderate RV dysfunction, PASP 64 mmHg, moderate pericardial effusion without tamponade, pericardial thickening, IVC normal in size but does not collapse. 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 She was started on po Lasix  and underwent RHC/hemodynamic LHC showing elevated R/L filling pressures, severe mixed pulmonary arterial/pulmonary venous hypertension, low CI by TD but higher by Fick, RV/RV pressure tracings were not suggestive of effusive/constrictive pericarditis. She underwent cMRI showing LVEF 46%, RVEF 20% with D-shaped interventricular septum suggestive of RV pressure/volume overload, moderate circumferential pericardial effusion without tamponade, non-specific mid-wall LGE at RV insertion site. Free breathing sequences did not appear to show respirophasic variation of the interventricular septal position. Planned to repeat RHC after better diuresis however patient left AMA, weight 132 lbs.   Today she returns for post hospital HF follow up. Overall feeling more SOB.  She feels SOB with ADLs and housework now. Having dizziness, no falls or syncope.  She has rare atypical chest pain. Denies palpitations, abnormal bleeding, CP, edema, or PND/Orthopnea. Appetite ok. Does not weigh at home. Was not discharged on any HF meds other than Lasix . Smokes 1-2 cigs/day, drinks 16oz beer every other day, no drugs. She does not snore  ECG (personally reviewed): ST 100's  Labs (1/26): K 4.0, creatinine 0.92, ANA, RF, HIV negative, anti-SCL-70 negative, anti-centromere abs negative  Cardiac Studies - cMRI (1/26): LVEF 46%, RVEF 20% with D-shaped interventricular septum suggestive of RV pressure/volume overload, moderate circumferential pericardial effusion without tamponade, non-specific mid-wall LGE at RV insertion site. Free breathing sequences did not appear to show respirophasic variation of the interventricular septal position.  - RHC (1/26): RA 15, PA 72/36 (53), PCWP 24, CO/CI (Fick) 4.33/2.47, CO/CI (TD) 3.68/2.1, PVR 6.7 WU, PAPi 2.4; Simultaneous LV/RV pressure tracings showed that the RV and LV pressures tracked together, there did not appear to be ventricular interdependence; not suggestive of effusive/constrictive pericarditis - Echo (1/26): EF 65-70%, interventricular septum is flattened in systole and diastole, consistent with right ventricular pressure and volume overload, RV moderately reduced, severely elevated RVSP  64.2 mmHg, moderate pericardial effusion, IVC dilated.  Past Medical History:  Diagnosis Date   ARDS (adult respiratory distress syndrome) (HCC)    Asthma    Hodgkin lymphoma (HCC)    Hypertension    Current Outpatient Medications  Medication Sig Dispense Refill   albuterol  (VENTOLIN  HFA) 108 (90 Base) MCG/ACT inhaler Inhale 1-2 puffs into the lungs every 6 (six) hours as needed for wheezing or shortness of breath. 18 g 0   budesonide -formoterol  (SYMBICORT ) 80-4.5 MCG/ACT inhaler Inhale 2 puffs into the lungs 2 (two) times daily as needed. 1  each 0   furosemide  (  LASIX ) 40 MG tablet Take 1 tablet (40 mg total) by mouth daily. 30 tablet 11   predniSONE  (STERAPRED UNI-PAK 21 TAB) 10 MG (21) TBPK tablet Take 40 mg (4 tablets) days 1 and 2, take 30 mg (3 tablets) days 3 and 4, take 20 mg (2 tablets) days 5 and 6, take 10 mg (1 tablet) days 7 and 8, take 5 mg (0.5 mg) days 9 and 10, then stop. (Patient not taking: Reported on 01/02/2025) 21 tablet 0   No current facility-administered medications for this encounter.   Facility-Administered Medications Ordered in Other Encounters  Medication Dose Route Frequency Provider Last Rate Last Admin   sodium chloride  flush (NS) 0.9 % injection 10 mL  10 mL Intracatheter PRN Onesimo [name], MD       Allergies[1]  Social History   Socioeconomic History   Marital status: Single    Spouse name: Not on file   Number of children: Not on file   Years of education: Not on file   Highest education level: Not on file  Occupational History   Not on file  Tobacco Use   Smoking status: Some Days    Current packs/day: 0.00    Average packs/day: 0.5 packs/day for 15.0 years (7.5 ttl pk-yrs)    Types: Cigarettes    Start date: 03/27/2001    Last attempt to quit: 03/27/2016    Years since quitting: 8.8   Smokeless tobacco: Never  Vaping Use   Vaping status: Never Used  Substance and Sexual Activity   Alcohol use: Yes    Comment: occasional now   Drug use: Not Currently   Sexual activity: Yes    Birth control/protection: None  Other Topics Concern   Not on file  Social History Narrative   Not on file   Social Drivers of Health   Tobacco Use: High Risk (01/02/2025)   Patient History    Smoking Tobacco Use: Some Days    Smokeless Tobacco Use: Never    Passive Exposure: Not on file  Financial Resource Strain: Not on file  Food Insecurity: No Food Insecurity (01/03/2025)   Epic    Worried About Programme Researcher, Broadcasting/film/video in the Last Year: Never true    Ran Out of Food in the Last Year: Never true   Transportation Needs: No Transportation Needs (01/03/2025)   Epic    Lack of Transportation (Medical): No    Lack of Transportation (Non-Medical): No  Physical Activity: Not on file  Stress: Not on file  Social Connections: Not on file  Intimate Partner Violence: Not At Risk (01/03/2025)   Epic    Fear of Current or Ex-Partner: No    Emotionally Abused: No    Physically Abused: No    Sexually Abused: No  Depression (PHQ2-9): Not on file  Alcohol Screen: Not on file  Housing: Unknown (01/03/2025)   Epic    Unable to Pay for Housing in the Last Year: No    Number of Times Moved in the Last Year: Not on file    Homeless in the Last Year: No  Utilities: Not At Risk (01/03/2025)   Epic    Threatened with loss of utilities: No  Health Literacy: Not on file   Family History  Problem Relation Age of Onset   Hypertension Mother    Wt Readings from Last 3 Encounters:  01/29/25 63.8 kg (140 lb 9.6 oz)  01/08/25 60.1 kg (132 lb 6.4 oz)  12/11/24 65.3 kg (144 lb)  BP 110/76   Pulse (!) 110   Wt 63.8 kg (140 lb 9.6 oz)   LMP 12/11/2024 (Approximate)   SpO2 98%   BMI 22.02 kg/m   PHYSICAL EXAM: General:  NAD. No resp difficulty, walked into clinic HEENT: Normal Neck: Supple. JVP 8-10 Cor: Tachy regular rate & rhythm. No rubs, gallops or murmurs. Lungs: Clear, diminished in bases Abdomen: Soft, nontender, nondistended.  Extremities: No cyanosis, clubbing, rash, edema Neuro: Alert & oriented x 3, moves all 4 extremities w/o difficulty. Affect pleasant.  ASSESSMENT & PLAN: 1. Pericardial disease: Patient has had a moderate pericardial effusion since at least 7/24.  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  She has a history of Hodgkins lymphoma and radiation to the right chest due to SVC syndrome.  RHC/hemodynamic LHC 01/07/25 showed elevated R/L-sided filling pressures but did not  show equalization of diastolic pressures. Simultaneous LV and RV pressures tracings were not suggestive of ventricular interdependence (LV and RV pressures appeared to track together rather than oppositely).  This did not give us  evidence for constrictive physiology.  - cMRI showed VEF 46%, RVEF 20% with D-shaped interventricular septum suggestive of RV pressure/volume overload, moderate circumferential pericardial effusion without tamponade, non-specific mid-wall LGE at RV insertion site. Free breathing sequences did not appear to show respirophasic variation of the interventricular septal position.  - Restart colchicine  0.6 mg daily. 2. Chronic Diastolic Heart Failure: Echo this admission 1/26 showed EF 65-70%, D-shaped septum, mild RV enlargement with moderate RV dysfunction, PASP 64 mmHg, moderate pericardial effusion without tamponade, pericardial thickening. As above, RHC did not confirm pericardial constriction-type physiology.  There appeared to be elevated PCWP consistent with LV diastolic dysfunction, severe mixed pulmonary arterial/pulmonary venous hypertension, and RV failure with elevated RA pressure. NYHA II-early III, functional class confounded by smoking. She appears mildly volume overloaded today, weight up 8 lbs. - Restart Farxiga  10 mg daily. BMET/pro-BNP today. - Restart Coreg  3.125 mg bid. - Continue Lasix  40 mg daily. - Add spiro next. 3. Pulmonary hypertension: Cath showed severe mixed pulmonary venous/pulmonary arterial hypertension.  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 WHO group 1. ANA, RF, HIV negative. CTA chest with no PE. autoimmune workup with anti-SCL-70, anti-centromere Abs were negative. cMRI not suggestive of restrictive physiology. - Increasing diuresis as above, repeat RHC to assess hemodynamics. Discussed with Dr. Rolan. Informed Consent   Shared Decision Making/Informed Consent The risks, including but not limited to, [bleeding or vascular complications (1 in 500),  pneumothorax (1 in 1600), arrhythmia (1 in 1000) and death (1 in 5000)], benefits (diagnostic support and/or management of heart failure, pulmonary hypertension) and alternatives of a right heart catheterization were discussed in detail with Heidi [name] and she is willing to proceed.     4. SVC syndrome: She had palliative radiation to right lung mass in the past.  Of note, unable to do RHC from right brachial area due to obstruction of right subclavian, unable to pass Swan into SVC.    Follow up with APP 2 weeks after RHC.  Harlene Gainer, FNP-BC 01/29/25     [1]  Allergies Allergen Reactions   Doxorubicin  Hcl Liposomal Anaphylaxis, Shortness Of Breath and Other (See Comments)    Pt had severe hypersensitivity reaction with chest pain and SOB immediately following start of medication. See hypersensitivity note from 07/14/2023.   "

## 2025-01-30 ENCOUNTER — Ambulatory Visit (HOSPITAL_COMMUNITY): Payer: Self-pay | Admitting: Family Medicine

## 2025-02-01 ENCOUNTER — Other Ambulatory Visit (HOSPITAL_COMMUNITY): Payer: Self-pay

## 2025-02-14 ENCOUNTER — Encounter (HOSPITAL_COMMUNITY): Payer: Self-pay

## 2025-02-14 ENCOUNTER — Ambulatory Visit (HOSPITAL_COMMUNITY): Admit: 2025-02-14 | Admitting: Cardiology

## 2025-02-28 ENCOUNTER — Ambulatory Visit (HOSPITAL_COMMUNITY)

## 2025-03-19 ENCOUNTER — Inpatient Hospital Stay

## 2025-03-19 ENCOUNTER — Inpatient Hospital Stay: Admitting: Hematology
